# Patient Record
Sex: Male | Born: 1945 | Race: White | Hispanic: No | Marital: Married | Smoking: Former smoker
Health system: Southern US, Community
[De-identification: ages and names within clinical notes are randomized; demographics above are authoritative.]

## PROBLEM LIST (undated history)

## (undated) DIAGNOSIS — T8859XA Other complications of anesthesia, initial encounter: Secondary | ICD-10-CM

## (undated) DIAGNOSIS — D126 Benign neoplasm of colon, unspecified: Secondary | ICD-10-CM

## (undated) DIAGNOSIS — E039 Hypothyroidism, unspecified: Secondary | ICD-10-CM

## (undated) DIAGNOSIS — K5790 Diverticulosis of intestine, part unspecified, without perforation or abscess without bleeding: Secondary | ICD-10-CM

## (undated) DIAGNOSIS — R5383 Other fatigue: Secondary | ICD-10-CM

## (undated) DIAGNOSIS — I714 Abdominal aortic aneurysm, without rupture, unspecified: Secondary | ICD-10-CM

## (undated) DIAGNOSIS — C801 Malignant (primary) neoplasm, unspecified: Secondary | ICD-10-CM

## (undated) DIAGNOSIS — R251 Tremor, unspecified: Secondary | ICD-10-CM

## (undated) DIAGNOSIS — I10 Essential (primary) hypertension: Secondary | ICD-10-CM

## (undated) DIAGNOSIS — I499 Cardiac arrhythmia, unspecified: Secondary | ICD-10-CM

## (undated) DIAGNOSIS — K219 Gastro-esophageal reflux disease without esophagitis: Secondary | ICD-10-CM

## (undated) DIAGNOSIS — Q2730 Arteriovenous malformation, site unspecified: Secondary | ICD-10-CM

## (undated) DIAGNOSIS — Z923 Personal history of irradiation: Secondary | ICD-10-CM

## (undated) DIAGNOSIS — I6529 Occlusion and stenosis of unspecified carotid artery: Secondary | ICD-10-CM

## (undated) DIAGNOSIS — I472 Ventricular tachycardia: Secondary | ICD-10-CM

## (undated) DIAGNOSIS — J449 Chronic obstructive pulmonary disease, unspecified: Secondary | ICD-10-CM

## (undated) DIAGNOSIS — I255 Ischemic cardiomyopathy: Secondary | ICD-10-CM

## (undated) DIAGNOSIS — D649 Anemia, unspecified: Secondary | ICD-10-CM

## (undated) DIAGNOSIS — N400 Enlarged prostate without lower urinary tract symptoms: Secondary | ICD-10-CM

## (undated) DIAGNOSIS — E785 Hyperlipidemia, unspecified: Secondary | ICD-10-CM

## (undated) DIAGNOSIS — T4145XA Adverse effect of unspecified anesthetic, initial encounter: Secondary | ICD-10-CM

## (undated) DIAGNOSIS — I219 Acute myocardial infarction, unspecified: Secondary | ICD-10-CM

## (undated) DIAGNOSIS — I2510 Atherosclerotic heart disease of native coronary artery without angina pectoris: Secondary | ICD-10-CM

## (undated) DIAGNOSIS — R0600 Dyspnea, unspecified: Secondary | ICD-10-CM

## (undated) DIAGNOSIS — G4733 Obstructive sleep apnea (adult) (pediatric): Secondary | ICD-10-CM

## (undated) DIAGNOSIS — I739 Peripheral vascular disease, unspecified: Secondary | ICD-10-CM

## (undated) DIAGNOSIS — Z95810 Presence of automatic (implantable) cardiac defibrillator: Secondary | ICD-10-CM

## (undated) DIAGNOSIS — I509 Heart failure, unspecified: Secondary | ICD-10-CM

## (undated) DIAGNOSIS — Z9989 Dependence on other enabling machines and devices: Secondary | ICD-10-CM

## (undated) DIAGNOSIS — Z8719 Personal history of other diseases of the digestive system: Secondary | ICD-10-CM

## (undated) HISTORY — DX: Atherosclerotic heart disease of native coronary artery without angina pectoris: I25.10

## (undated) HISTORY — DX: Heart failure, unspecified: I50.9

## (undated) HISTORY — DX: Benign neoplasm of colon, unspecified: D12.6

## (undated) HISTORY — DX: Ventricular tachycardia: I47.2

## (undated) HISTORY — DX: Hyperlipidemia, unspecified: E78.5

## (undated) HISTORY — PX: ANGIOPLASTY: SHX39

## (undated) HISTORY — DX: Anemia, unspecified: D64.9

## (undated) HISTORY — DX: Benign prostatic hyperplasia without lower urinary tract symptoms: N40.0

## (undated) HISTORY — DX: Tremor, unspecified: R25.1

## (undated) HISTORY — DX: Diverticulosis of intestine, part unspecified, without perforation or abscess without bleeding: K57.90

## (undated) HISTORY — DX: Acute myocardial infarction, unspecified: I21.9

## (undated) HISTORY — DX: Abdominal aortic aneurysm, without rupture: I71.4

## (undated) HISTORY — DX: Gastro-esophageal reflux disease without esophagitis: K21.9

## (undated) HISTORY — DX: Essential (primary) hypertension: I10

## (undated) HISTORY — DX: Obstructive sleep apnea (adult) (pediatric): G47.33

## (undated) HISTORY — DX: Other fatigue: R53.83

## (undated) HISTORY — DX: Ischemic cardiomyopathy: I25.5

## (undated) HISTORY — DX: Personal history of irradiation: Z92.3

## (undated) HISTORY — DX: Dependence on other enabling machines and devices: Z99.89

## (undated) HISTORY — DX: Arteriovenous malformation, site unspecified: Q27.30

## (undated) HISTORY — DX: Hypothyroidism, unspecified: E03.9

## (undated) HISTORY — PX: POLYPECTOMY: SHX149

## (undated) HISTORY — DX: Chronic obstructive pulmonary disease, unspecified: J44.9

## (undated) HISTORY — DX: Abdominal aortic aneurysm, without rupture, unspecified: I71.40

---

## 1985-11-04 HISTORY — PX: CORONARY ARTERY BYPASS GRAFT: SHX141

## 1997-03-11 HISTORY — PX: ILIAC ARTERY STENT: SHX1786

## 1998-04-17 ENCOUNTER — Inpatient Hospital Stay (HOSPITAL_COMMUNITY): Admission: EM | Admit: 1998-04-17 | Discharge: 1998-04-23 | Payer: Self-pay | Admitting: Emergency Medicine

## 1998-06-10 ENCOUNTER — Inpatient Hospital Stay (HOSPITAL_COMMUNITY): Admission: AD | Admit: 1998-06-10 | Discharge: 1998-06-14 | Payer: Self-pay | Admitting: Obstetrics & Gynecology

## 1998-06-11 HISTORY — PX: CARDIAC CATHETERIZATION: SHX172

## 2002-10-01 ENCOUNTER — Encounter: Payer: Self-pay | Admitting: Cardiovascular Disease

## 2002-10-01 ENCOUNTER — Inpatient Hospital Stay (HOSPITAL_COMMUNITY): Admission: EM | Admit: 2002-10-01 | Discharge: 2002-10-05 | Payer: Self-pay | Admitting: Emergency Medicine

## 2002-10-03 HISTORY — PX: CARDIAC CATHETERIZATION: SHX172

## 2002-10-21 ENCOUNTER — Encounter: Payer: Self-pay | Admitting: Gastroenterology

## 2003-12-15 ENCOUNTER — Observation Stay (HOSPITAL_COMMUNITY): Admission: EM | Admit: 2003-12-15 | Discharge: 2003-12-16 | Payer: Self-pay | Admitting: Emergency Medicine

## 2003-12-24 ENCOUNTER — Ambulatory Visit (HOSPITAL_COMMUNITY): Admission: RE | Admit: 2003-12-24 | Discharge: 2003-12-24 | Payer: Self-pay | Admitting: Gastroenterology

## 2003-12-24 ENCOUNTER — Encounter: Payer: Self-pay | Admitting: Internal Medicine

## 2004-01-08 ENCOUNTER — Ambulatory Visit (HOSPITAL_COMMUNITY): Admission: RE | Admit: 2004-01-08 | Discharge: 2004-01-09 | Payer: Self-pay | Admitting: Cardiovascular Disease

## 2004-01-08 HISTORY — PX: CORONARY ANGIOPLASTY: SHX604

## 2004-01-09 ENCOUNTER — Encounter: Payer: Self-pay | Admitting: Gastroenterology

## 2004-01-12 DIAGNOSIS — D126 Benign neoplasm of colon, unspecified: Secondary | ICD-10-CM

## 2004-01-12 HISTORY — DX: Benign neoplasm of colon, unspecified: D12.6

## 2004-01-20 ENCOUNTER — Encounter: Payer: Self-pay | Admitting: Gastroenterology

## 2004-03-21 ENCOUNTER — Encounter: Admission: RE | Admit: 2004-03-21 | Discharge: 2004-03-21 | Payer: Self-pay | Admitting: Cardiology

## 2004-03-24 ENCOUNTER — Ambulatory Visit (HOSPITAL_COMMUNITY): Admission: RE | Admit: 2004-03-24 | Discharge: 2004-03-25 | Payer: Self-pay | Admitting: Cardiovascular Disease

## 2004-03-24 HISTORY — PX: RENAL ARTERY STENT: SHX2321

## 2005-03-16 ENCOUNTER — Encounter: Admission: RE | Admit: 2005-03-16 | Discharge: 2005-03-16 | Payer: Self-pay | Admitting: Cardiovascular Disease

## 2005-03-21 ENCOUNTER — Observation Stay (HOSPITAL_COMMUNITY): Admission: RE | Admit: 2005-03-21 | Discharge: 2005-03-22 | Payer: Self-pay | Admitting: *Deleted

## 2005-06-10 HISTORY — PX: CARDIAC DEFIBRILLATOR PLACEMENT: SHX171

## 2007-06-18 ENCOUNTER — Emergency Department (HOSPITAL_COMMUNITY): Admission: EM | Admit: 2007-06-18 | Discharge: 2007-06-18 | Payer: Self-pay | Admitting: Emergency Medicine

## 2007-09-15 ENCOUNTER — Ambulatory Visit: Payer: Self-pay | Admitting: Internal Medicine

## 2007-09-15 ENCOUNTER — Inpatient Hospital Stay (HOSPITAL_COMMUNITY): Admission: EM | Admit: 2007-09-15 | Discharge: 2007-09-20 | Payer: Self-pay | Admitting: Emergency Medicine

## 2007-09-16 HISTORY — PX: CARDIAC CATHETERIZATION: SHX172

## 2007-10-07 ENCOUNTER — Encounter: Admission: RE | Admit: 2007-10-07 | Discharge: 2007-10-07 | Payer: Self-pay | Admitting: Cardiovascular Disease

## 2008-11-03 ENCOUNTER — Telehealth: Payer: Self-pay | Admitting: Gastroenterology

## 2008-12-13 ENCOUNTER — Inpatient Hospital Stay (HOSPITAL_COMMUNITY): Admission: EM | Admit: 2008-12-13 | Discharge: 2008-12-18 | Payer: Self-pay | Admitting: Emergency Medicine

## 2008-12-16 ENCOUNTER — Ambulatory Visit: Payer: Self-pay | Admitting: Vascular Surgery

## 2009-03-23 ENCOUNTER — Ambulatory Visit: Payer: Self-pay | Admitting: Gastroenterology

## 2009-03-23 DIAGNOSIS — Z860100 Personal history of colon polyps, unspecified: Secondary | ICD-10-CM | POA: Insufficient documentation

## 2009-03-23 DIAGNOSIS — R1319 Other dysphagia: Secondary | ICD-10-CM | POA: Insufficient documentation

## 2009-03-23 DIAGNOSIS — K219 Gastro-esophageal reflux disease without esophagitis: Secondary | ICD-10-CM | POA: Insufficient documentation

## 2009-03-23 DIAGNOSIS — I2510 Atherosclerotic heart disease of native coronary artery without angina pectoris: Secondary | ICD-10-CM | POA: Insufficient documentation

## 2009-03-24 ENCOUNTER — Encounter: Payer: Self-pay | Admitting: Gastroenterology

## 2009-04-27 ENCOUNTER — Encounter: Payer: Self-pay | Admitting: Gastroenterology

## 2009-04-27 ENCOUNTER — Ambulatory Visit: Payer: Self-pay | Admitting: Gastroenterology

## 2009-04-27 LAB — HM COLONOSCOPY

## 2009-04-28 ENCOUNTER — Encounter: Payer: Self-pay | Admitting: Gastroenterology

## 2009-04-29 ENCOUNTER — Encounter (INDEPENDENT_AMBULATORY_CARE_PROVIDER_SITE_OTHER): Payer: Self-pay

## 2009-07-08 ENCOUNTER — Ambulatory Visit: Payer: Self-pay | Admitting: Gastroenterology

## 2009-11-08 ENCOUNTER — Ambulatory Visit: Payer: Self-pay | Admitting: Internal Medicine

## 2009-11-08 DIAGNOSIS — M549 Dorsalgia, unspecified: Secondary | ICD-10-CM | POA: Insufficient documentation

## 2009-11-22 ENCOUNTER — Ambulatory Visit: Payer: Self-pay | Admitting: Internal Medicine

## 2009-11-22 DIAGNOSIS — I10 Essential (primary) hypertension: Secondary | ICD-10-CM | POA: Insufficient documentation

## 2009-11-22 DIAGNOSIS — N400 Enlarged prostate without lower urinary tract symptoms: Secondary | ICD-10-CM | POA: Insufficient documentation

## 2009-11-22 DIAGNOSIS — R7309 Other abnormal glucose: Secondary | ICD-10-CM | POA: Insufficient documentation

## 2009-11-22 DIAGNOSIS — N401 Enlarged prostate with lower urinary tract symptoms: Secondary | ICD-10-CM | POA: Insufficient documentation

## 2009-11-22 LAB — CONVERTED CEMR LAB
BUN: 25 mg/dL — ABNORMAL HIGH (ref 6–23)
CO2: 28 meq/L (ref 19–32)
Calcium: 9 mg/dL (ref 8.4–10.5)
Chloride: 106 meq/L (ref 96–112)
Creatinine, Ser: 0.9 mg/dL (ref 0.4–1.5)
GFR calc non Af Amer: 90.04 mL/min (ref >60)
Glucose, Bld: 92 mg/dL (ref 70–99)
Hgb A1c MFr Bld: 5.6 % (ref 4.6–6.5)
PSA: 3.34 ng/mL (ref 0.10–4.00)
Potassium: 4.4 meq/L (ref 3.5–5.1)
Sodium: 141 meq/L (ref 135–145)

## 2010-02-14 ENCOUNTER — Telehealth: Payer: Self-pay | Admitting: Gastroenterology

## 2010-02-25 ENCOUNTER — Encounter: Payer: Self-pay | Admitting: Internal Medicine

## 2010-04-11 ENCOUNTER — Emergency Department (HOSPITAL_COMMUNITY): Admission: EM | Admit: 2010-04-11 | Discharge: 2010-04-11 | Payer: Self-pay | Admitting: Emergency Medicine

## 2010-05-02 ENCOUNTER — Ambulatory Visit (HOSPITAL_COMMUNITY): Admission: RE | Admit: 2010-05-02 | Discharge: 2010-05-02 | Payer: Self-pay | Admitting: Cardiovascular Disease

## 2010-05-02 HISTORY — PX: ICD GENERATOR CHANGE: SHX5854

## 2010-07-11 ENCOUNTER — Ambulatory Visit: Payer: Self-pay | Admitting: Internal Medicine

## 2010-07-11 DIAGNOSIS — R972 Elevated prostate specific antigen [PSA]: Secondary | ICD-10-CM | POA: Insufficient documentation

## 2010-07-11 LAB — CONVERTED CEMR LAB
ALT: 136 units/L — ABNORMAL HIGH (ref 0–53)
AST: 78 units/L — ABNORMAL HIGH (ref 0–37)
Albumin: 3.8 g/dL (ref 3.5–5.2)
Alkaline Phosphatase: 77 units/L (ref 39–117)
BUN: 16 mg/dL (ref 6–23)
Basophils Absolute: 0 10*3/uL (ref 0.0–0.1)
Basophils Relative: 0.2 % (ref 0.0–3.0)
Bilirubin Urine: NEGATIVE
Bilirubin, Direct: 0.3 mg/dL (ref 0.0–0.3)
CO2: 29 meq/L (ref 19–32)
Calcium: 9 mg/dL (ref 8.4–10.5)
Chloride: 104 meq/L (ref 96–112)
Cholesterol: 99 mg/dL (ref 0–200)
Creatinine, Ser: 0.7 mg/dL (ref 0.4–1.5)
Eosinophils Absolute: 0.4 10*3/uL (ref 0.0–0.7)
Eosinophils Relative: 6.9 % — ABNORMAL HIGH (ref 0.0–5.0)
GFR calc non Af Amer: 118.15 mL/min (ref >60)
Glucose, Bld: 99 mg/dL (ref 70–99)
HCT: 39.9 % (ref 39.0–52.0)
HDL: 36 mg/dL — ABNORMAL LOW (ref >39.00)
Hemoglobin, Urine: NEGATIVE
Hemoglobin: 13.5 g/dL (ref 13.0–17.0)
Hgb A1c MFr Bld: 5.5 % (ref 4.6–6.5)
Ketones, ur: NEGATIVE mg/dL
LDL Cholesterol: 51 mg/dL (ref 0–99)
Leukocytes, UA: NEGATIVE
Lymphocytes Relative: 14.5 % (ref 12.0–46.0)
Lymphs Abs: 0.7 10*3/uL (ref 0.7–4.0)
MCHC: 33.8 g/dL (ref 30.0–36.0)
MCV: 91.9 fL (ref 78.0–100.0)
Monocytes Absolute: 0.6 10*3/uL (ref 0.1–1.0)
Monocytes Relative: 12.1 % — ABNORMAL HIGH (ref 3.0–12.0)
Neutro Abs: 3.4 10*3/uL (ref 1.4–7.7)
Neutrophils Relative %: 66.3 % (ref 43.0–77.0)
Nitrite: NEGATIVE
PSA: 2.68 ng/mL (ref 0.10–4.00)
Platelets: 139 10*3/uL — ABNORMAL LOW (ref 150.0–400.0)
Potassium: 4.9 meq/L (ref 3.5–5.1)
RBC: 4.34 M/uL (ref 4.22–5.81)
RDW: 14 % (ref 11.5–14.6)
Sodium: 139 meq/L (ref 135–145)
Specific Gravity, Urine: <=1.005 (ref 1.000–1.030)
TSH: 0.04 microintl units/mL — ABNORMAL LOW (ref 0.35–5.50)
Total Bilirubin: 1.1 mg/dL (ref 0.3–1.2)
Total CHOL/HDL Ratio: 3
Total Protein, Urine: NEGATIVE mg/dL
Total Protein: 6.1 g/dL (ref 6.0–8.3)
Triglycerides: 58 mg/dL (ref 0.0–149.0)
Urine Glucose: NEGATIVE mg/dL
Urobilinogen, UA: 0.2 (ref 0.0–1.0)
VLDL: 11.6 mg/dL (ref 0.0–40.0)
WBC: 5.1 10*3/uL (ref 4.5–10.5)
pH: 5 (ref 5.0–8.0)

## 2010-07-12 ENCOUNTER — Encounter: Payer: Self-pay | Admitting: Internal Medicine

## 2010-07-14 ENCOUNTER — Telehealth: Payer: Self-pay | Admitting: Internal Medicine

## 2010-07-28 ENCOUNTER — Encounter: Payer: Self-pay | Admitting: Internal Medicine

## 2010-08-04 ENCOUNTER — Ambulatory Visit: Payer: Self-pay | Admitting: Internal Medicine

## 2010-08-10 ENCOUNTER — Encounter: Payer: Self-pay | Admitting: Internal Medicine

## 2010-08-11 ENCOUNTER — Encounter: Payer: Self-pay | Admitting: Internal Medicine

## 2010-08-24 ENCOUNTER — Ambulatory Visit: Payer: Self-pay | Admitting: Internal Medicine

## 2010-10-14 ENCOUNTER — Ambulatory Visit: Payer: Self-pay | Admitting: Internal Medicine

## 2010-10-14 DIAGNOSIS — J449 Chronic obstructive pulmonary disease, unspecified: Secondary | ICD-10-CM | POA: Insufficient documentation

## 2010-11-25 ENCOUNTER — Encounter: Payer: Self-pay | Admitting: Internal Medicine

## 2010-12-29 ENCOUNTER — Ambulatory Visit
Admission: RE | Admit: 2010-12-29 | Discharge: 2010-12-29 | Payer: Self-pay | Source: Home / Self Care | Attending: Internal Medicine | Admitting: Internal Medicine

## 2010-12-29 DIAGNOSIS — J019 Acute sinusitis, unspecified: Secondary | ICD-10-CM | POA: Insufficient documentation

## 2011-01-09 ENCOUNTER — Telehealth: Payer: Self-pay | Admitting: Internal Medicine

## 2011-01-12 NOTE — Assessment & Plan Note (Signed)
Summary: YEARLY F/U MEDICARE/WILL COME FASTING FOR LABS AFTER CPX/CD   Vital Signs:  Patient profile:   65 year old male Height:      68 inches Weight:      184 pounds O2 Sat:      95 % on Room air Temp:     98.0 degrees F oral Pulse rate:   58 / minute Pulse rhythm:   regular Resp:     16 per minute BP sitting:   100 / 54  (left arm) Cuff size:   large  Vitals Entered By: Estell Harpin CMA (July 11, 2010 9:03 AM)  O2 Flow:  Room air CC: CPX w/ labs, Preventive Care Is Patient Diabetic? No Pain Assessment Patient in pain? no        Primary Care Provider:  [name] MD  CC:  CPX w/ labs and Preventive Care.  History of Present Illness:  Follow-Up Visit      This is a 65 year old man who presents for Follow-up visit.  The patient complains of edema, but denies chest pain, palpitations, dizziness, syncope, low blood sugar symptoms, high blood sugar symptoms, SOB, DOE, PND, and orthopnea.  Since the last visit the patient notes no new problems or concerns.  The patient reports taking meds as prescribed, monitoring BP, monitoring blood sugars, and dietary noncompliance.  When questioned about possible medication side effects, the patient notes none.    Preventive Screening-Counseling & Management  Alcohol-Tobacco     Alcohol drinks/day: 1     Alcohol type: wine     >5/day in last 3 mos: no     Alcohol Counseling: not indicated; use of alcohol is not excessive or problematic     Feels need to cut down: no     Feels annoyed by complaints: no     Feels guilty re: drinking: no     Needs 'eye opener' in am: no     Smoking Status: quit     Year Quit: 2004     Tobacco Counseling: to remain off tobacco products  Hep-HIV-STD-Contraception     Hepatitis Risk: no risk noted     HIV Risk: no risk noted     STD Risk: no risk noted     TSE monthly: yes     Testicular SE Education/Counseling to perform regular STE  Safety-Violence-Falls     Seat Belt Use: yes  Helmet Use: yes     Firearms in the Home: no firearms in the home     Smoke Detectors: yes     Violence in the Home: no risk noted     Sexual Abuse: no      Sexual History:  currently monogamous.        Drug Use:  never.        Blood Transfusions:  no.    Clinical Review Panels:  Prevention   Last Colonoscopy:  Location:  [place].  (04/27/2009)   Last PSA:  3.34 (11/22/2009)  Diabetes Management   HgBA1C:  5.6 (11/22/2009)   Creatinine:  0.9 (11/22/2009)   Last Flu Vaccine:  given (10/11/2009)   Last Pneumovax:  given (12/11/2006)  Complete Metabolic Panel   Glucose:  92 (11/22/2009)   Sodium:  141 (11/22/2009)   Potassium:  4.4 (11/22/2009)   Chloride:  106 (11/22/2009)   CO2:  28 (11/22/2009)   BUN:  25 (11/22/2009)   Creatinine:  0.9 (11/22/2009)   Calcium:  9.0 (11/22/2009)  Medications Prior to Update: 1)  Coreg 25 Mg Tabs (Carvedilol) .... One Tablet By Mouth Two Times A Day 2)  Plavix 75 Mg Tabs (Clopidogrel Bisulfate) .... One Tablet By Mouth Once Daily 3)  Lasix 40 Mg Tabs (Furosemide) .... One Tablet By Mouth Once Daily 4)  Imdur 60 Mg Xr24h-Tab (Isosorbide Mononitrate) .... One Tablet By Mouth Every Morning and 1/2 Tablet By Mouth At Bedtime 5)  Nitroglycerin 0.4 Mg Subl (Nitroglycerin) .... As Needed 6)  Spironolactone 25 Mg Tabs (Spironolactone) .... One Tablet By Mouth Once Daily 7)  Ramipril 2.5 Mg Caps (Ramipril) .... 2 Tablets By Mouth Every Morning and 1 Tablet By Mouth At Bedtime 8)  Spiriva Handihaler 18 Mcg Caps (Tiotropium Bromide Monohydrate) .... Once Daily 9)  Ambien 10 Mg Tabs (Zolpidem Tartrate) .... One Tablet By Mouth At Bedtime As Needed 10)  Fish Oil  Oil (Fish Oil) .Marland Kitchen.. 1200 Mg 1 Capsule Every Other Day 11)  Omeprazole 40 Mg Cpdr (Omeprazole) .... One Tablet By Mouth At Bedtime 12)  Lipitor 40 Mg Tabs (Atorvastatin Calcium) .... Take 1 Tablet By Mouth Once A Day 13)  Cordarone 200 Mg Tabs (Amiodarone Hcl) .Marland Kitchen.. 1 Tab Am  and 1/2 Pm 14)  Onetouch Ultra Test  Strp (Glucose Blood) .... Test Qd 15)  Onetouch Ultrasoft Lancets  Misc (Lancets) .... Test Once Daily 16)  Ocuvite W/lutein 75mg  .... Once Daily 17)  Cranberry Fruit Extract 300mg  .... Take 1 Tablet By Mouth Once A Day 18)  Amrix 15 Mg Xr24h-Cap (Cyclobenzaprine Hcl) .... One By Mouth Once Daily As Needed For Low Back Spasms  Current Medications (verified): 1)  Coreg 25 Mg Tabs (Carvedilol) .... One Tablet By Mouth Two Times A Day 2)  Plavix 75 Mg Tabs (Clopidogrel Bisulfate) .... One Tablet By Mouth Once Daily 3)  Lasix 40 Mg Tabs (Furosemide) .... One Tablet By Mouth Once Daily 4)  Imdur 60 Mg Xr24h-Tab (Isosorbide Mononitrate) .... One Tablet By Mouth Every Morning and 1/2 Tablet By Mouth At Bedtime 5)  Nitroglycerin 0.4 Mg Subl (Nitroglycerin) .... As Needed 6)  Spironolactone 25 Mg Tabs (Spironolactone) .... One Tablet By Mouth Once Daily 7)  Ramipril 2.5 Mg Caps (Ramipril) .... 2 Tablets By Mouth Every Morning and 1 Tablet By Mouth At Bedtime 8)  Ambien 10 Mg Tabs (Zolpidem Tartrate) .... One Tablet By Mouth At Bedtime As Needed 9)  Fish Oil  Oil (Fish Oil) .Marland Kitchen.. 1200 Mg 1 Capsule Every Other Day 10)  Omeprazole 40 Mg Cpdr (Omeprazole) .... One Tablet By Mouth At Bedtime 11)  Lipitor 40 Mg Tabs (Atorvastatin Calcium) .... Take 1 Tablet By Mouth Once A Day 12)  Cordarone 200 Mg Tabs (Amiodarone Hcl) .Marland Kitchen.. 1 Tab Am and 1/2 Pm 13)  Onetouch Ultra Test  Strp (Glucose Blood) .... Test Qd 14)  Onetouch Ultrasoft Lancets  Misc (Lancets) .... Test Once Daily 15)  Ocuvite W/lutein 75mg  .... Once Daily 16)  Cranberry Fruit Extract 300mg  .... Take 1 Tablet By Mouth Once A Day 17)  Rapaflo 8 Mg Caps (Silodosin) .... One By Mouth Once Daily To Help Urine Flow  Allergies (verified): 1)  ! 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  Alcohol Use - no Illicit Drug Use - no  Risk Factors: Alcohol Use: 1 (07/11/2010) >5 drinks/d w/in last 3 months: no (07/11/2010)  Risk Factors: Smoking Status: quit (07/11/2010)  Family History: Reviewed history from 03/23/2009 and no changes required. Family History of Colon Cancer:Mother, sister Lung cancer: Maternal Grandmother, Maternal Grandfather Family History of Diabetes: siblings Family History of Heart Disease: siblings  Social History: Reviewed history from 03/23/2009 and no changes required. Married Occupation: retired Patient is a former smoker.  Alcohol Use - no Illicit Drug Use - no Hepatitis Risk:  no risk noted HIV Risk:  no risk noted STD Risk:  no risk noted Seat Belt Use:  yes  Review of Systems       The patient complains of peripheral edema.  The patient denies anorexia, fever, weight loss, weight gain, chest pain, syncope, prolonged cough, headaches, hemoptysis, abdominal pain, melena, hematochezia, severe indigestion/heartburn, hematuria, suspicious skin lesions, difficulty walking, depression, abnormal bleeding, enlarged lymph nodes, angioedema, and testicular masses.   Resp:  Denies chest pain with inspiration, cough, coughing up blood, pleuritic,  shortness of breath, sputum productive, and wheezing. GU:  Complains of nocturia, urinary frequency, and urinary hesitancy; denies decreased libido, discharge, dysuria, hematuria, and incontinence. Endo:  Complains of polyuria; denies cold intolerance, excessive hunger, excessive thirst, excessive urination, heat intolerance, and weight change.  Physical Exam  General:  obese.  alert, well-developed, well-nourished, well-hydrated, appropriate dress, normal appearance, healthy-appearing, cooperative to examination, and good hygiene.   Head:  normocephalic and atraumatic.   Eyes:  No corneal or conjunctival inflammation noted. EOMI. Perrla. Funduscopic exam benign, without hemorrhages, exudates or papilledema. Vision grossly normal. Ears:  R ear normal and L ear normal.   Mouth:  Oral mucosa and oropharynx without lesions or exudates.  Teeth in good repair. Neck:  supple, full ROM, no masses, no thyromegaly, no thyroid nodules or tenderness, no JVD, no carotid bruits, no cervical lymphadenopathy, and no neck tenderness.   Lungs:  normal respiratory effort, no intercostal retractions, no accessory muscle use, normal breath sounds, no dullness, no fremitus, no crackles, and no wheezes.   Heart:  normal rate, regular rhythm, no murmur, no gallop, no rub, and no JVD.   Abdomen:  soft, non-tender, normal bowel sounds, no distention, no masses, no guarding, no rigidity, no rebound tenderness, no abdominal hernia, no inguinal hernia, no hepatomegaly, and no splenomegaly.   Rectal:  No external abnormalities noted. Normal sphincter tone. No rectal masses or tenderness. Genitalia:  uncircumcised, no hydrocele, no varicocele, no scrotal masses, no cutaneous lesions, no urethral discharge, R testes atrophic, and L testes atrophic.   Prostate:  no nodules, no asymmetry, no induration, and 2+ enlarged.   Msk:  normal ROM, no joint tenderness, no joint swelling, no joint warmth, no redness over joints, no joint  deformities, no joint instability, and no crepitation.   Pulses:  R and L carotid,radial,femoral,dorsalis pedis and posterior tibial pulses are full and equal bilaterally Extremities:  2+ left pedal edema and 2+ right pedal edema.   Neurologic:  No cranial nerve deficits noted. Station and gait are normal. Plantar reflexes are down-going bilaterally. DTRs are symmetrical throughout. Sensory, motor  and coordinative functions appear intact. Skin:  turgor normal, color normal, no rashes, no suspicious lesions, no ecchymoses, no petechiae, no purpura, no ulcerations, and no edema.   Cervical Nodes:  no anterior cervical adenopathy and no posterior cervical adenopathy.   Axillary Nodes:  no R axillary adenopathy and no L axillary adenopathy.   Inguinal Nodes:  no R inguinal adenopathy and no L inguinal adenopathy.   Psych:  Oriented X3, memory intact for recent and remote, normally interactive, good eye contact, not anxious appearing, not depressed appearing, and not agitated.     Impression & Recommendations:  Problem # 1:  PSA, INCREASED (ICD-790.93) Assessment Unchanged  Orders: Venipuncture [id number]) TLB-Lipid Panel (80061-LIPID) TLB-BMP (Basic Metabolic Panel-BMET) (99991111) TLB-CBC Platelet - w/Differential (85025-CBCD) TLB-Hepatic/Liver Function Pnl (80076-HEPATIC) TLB-TSH (Thyroid Stimulating Hormone) (84443-TSH) TLB-A1C / Hgb A1C (Glycohemoglobin) (83036-A1C) TLB-PSA (Prostate Specific Antigen) (84153-PSA) TLB-Udip w/ Micro (81001-URINE)  Problem # 2:  HYPERTROPHY PROSTATE W/UR OBST & OTH LUTS (ICD-600.01) Assessment: Deteriorated  His updated medication list for this problem includes:    Rapaflo 8 Mg Caps (Silodosin) ..... One by mouth once daily to help urine flow  Orders: Venipuncture [id number]) TLB-Lipid Panel (80061-LIPID) TLB-BMP (Basic Metabolic Panel-BMET) (99991111) TLB-CBC Platelet - w/Differential (85025-CBCD) TLB-Hepatic/Liver Function Pnl  (80076-HEPATIC) TLB-TSH (Thyroid Stimulating Hormone) (84443-TSH) TLB-A1C / Hgb A1C (Glycohemoglobin) (83036-A1C) TLB-PSA (Prostate Specific Antigen) (84153-PSA) TLB-Udip w/ Micro (81001-URINE)  Problem # 3:  HYPERGLYCEMIA, BORDERLINE (ICD-790.29) Assessment: Unchanged  Orders: Venipuncture [id number]) TLB-Lipid Panel (80061-LIPID) TLB-BMP (Basic Metabolic Panel-BMET) (99991111) TLB-CBC Platelet - w/Differential (85025-CBCD) TLB-Hepatic/Liver Function Pnl (80076-HEPATIC) TLB-TSH (Thyroid Stimulating Hormone) (84443-TSH) TLB-A1C / Hgb A1C (Glycohemoglobin) (83036-A1C) TLB-PSA (Prostate Specific Antigen) (84153-PSA) TLB-Udip w/ Micro (81001-URINE)  Complete Medication List: 1)  Coreg 25 Mg Tabs (Carvedilol) .... One tablet by mouth two times a day 2)  Plavix 75 Mg Tabs (Clopidogrel bisulfate) .... One tablet by mouth once daily 3)  Lasix 40 Mg Tabs (Furosemide) .... One tablet by mouth once daily 4)  Imdur 60 Mg Xr24h-tab (Isosorbide mononitrate) .... One tablet by mouth every morning and 1/2 tablet by mouth at bedtime 5)  Nitroglycerin 0.4 Mg Subl (Nitroglycerin) .... As needed 6)  Spironolactone 25 Mg Tabs (Spironolactone) .... One tablet by mouth once daily 7)  Ramipril 2.5 Mg Caps (Ramipril) .... 2 tablets by mouth every morning and 1 tablet by mouth at bedtime 8)  Ambien 10 Mg Tabs (Zolpidem tartrate) .... One tablet by mouth at bedtime as needed 9)  Fish Oil Oil (Fish oil) .Marland Kitchen.. 1200 mg 1 capsule every other day 10)  Omeprazole 40 Mg Cpdr (Omeprazole) .... One tablet by mouth at bedtime 11)  Lipitor 40 Mg Tabs (Atorvastatin calcium) .... Take 1 tablet by mouth once a day 12)  Cordarone 200 Mg Tabs (Amiodarone hcl) .Marland Kitchen.. 1 tab am and 1/2 pm 13)  Onetouch Ultra Test Strp (Glucose blood) .... Test qd 14)  Onetouch Ultrasoft Lancets Misc (Lancets) .... Test once daily 15)  Ocuvite W/lutein 75mg   .... Once daily 16)  Cranberry Fruit Extract 300mg   .... Take 1 tablet by mouth once a  day 17)  Rapaflo 8 Mg Caps (Silodosin) .... One by mouth once daily to help urine flow  Colorectal Screening:  Current Recommendations:    Hemoccult: NEG X 1 today  PSA Screening:    PSA: 3.34  (11/22/2009)    Reviewed PSA screening recommendations: PSA ordered  Immunization & Chemoprophylaxis:    Tetanus vaccine: Historical  (12/11/2005)    Influenza vaccine: given  (10/11/2009)  Pneumovax: given  (12/11/2006)  Patient Instructions: 1)  Please schedule a follow-up appointment in 3 months. Prescriptions: RAPAFLO 8 MG CAPS (SILODOSIN) One by mouth once daily to help urine flow  #84 x 0   Entered and Authorized by:   [name] MD   Signed by:   [name] MD on 07/11/2010   Method used:   Samples Given   RxID:   [id number]

## 2011-01-12 NOTE — Letter (Signed)
Summary: [facility]  [facility]   Imported By: Phillis Knack 08/08/2010 11:06:04  _____________________________________________________________________  External Attachment:    Type:   Image     Comment:   External Document

## 2011-01-12 NOTE — Assessment & Plan Note (Signed)
Summary: 3 WK ROV PER TRIAGE/NWS   Vital Signs:  Patient profile:   65 year old male Height:      68 inches Weight:      221.75 pounds BMI:     33.84 O2 Sat:      95 % on Room air Temp:     98.0 degrees F oral Pulse rate:   56 / minute Pulse rhythm:   regular Resp:     16 per minute BP sitting:   102 / 58  (left arm) Cuff size:   large  Vitals Entered By: Estell Harpin CMA (August 04, 2010 10:04 AM)  Nutrition Counseling: Patient's BMI is greater than 25 and therefore counseled on weight management options.  O2 Flow:  Room air CC: follow-up visit//lab results Is Patient Diabetic? No Pain Assessment Patient in pain? no        Primary Care Provider:  [name] MD  CC:  follow-up visit//lab results.  History of Present Illness:  Follow-Up Visit      This is a 65 year old man who presents for Follow-up visit.  The patient complains of edema, but denies chest pain, palpitations, dizziness, syncope, low blood sugar symptoms, high blood sugar symptoms, SOB, DOE, PND, and orthopnea.  Since the last visit the patient notes no new problems or concerns.  The patient reports taking meds as prescribed, monitoring BP, monitoring blood sugars, and dietary compliance.  When questioned about possible medication side effects, the patient notes none.    Preventive Screening-Counseling & Management  Alcohol-Tobacco     Alcohol drinks/day: 1     Alcohol type: wine     >5/day in last 3 mos: no     Alcohol Counseling: not indicated; use of alcohol is not excessive or problematic     Feels need to cut down: no     Feels annoyed by complaints: no     Feels guilty re: drinking: no     Needs 'eye opener' in am: no     Smoking Status: quit     Year Quit: 2004     Tobacco Counseling: to remain off tobacco products  Hep-HIV-STD-Contraception     Hepatitis Risk: no risk noted     HIV Risk: no risk noted     STD Risk: no risk noted     TSE monthly: yes     Testicular SE  Education/Counseling to perform regular STE      Sexual History:  currently monogamous.        Drug Use:  never.        Blood Transfusions:  no.    Clinical Review Panels:  Lipid Management   Cholesterol:  99 (07/11/2010)   LDL (bad choesterol):  51 (07/11/2010)   HDL (good cholesterol):  36.00 (07/11/2010)  Diabetes Management   HgBA1C:  5.5 (07/11/2010)   Creatinine:  0.7 (07/11/2010)   Last Flu Vaccine:  given (10/11/2009)   Last Pneumovax:  given (12/11/2006)  CBC   WBC:  5.1 (07/11/2010)   RBC:  4.34 (07/11/2010)   Hgb:  13.5 (07/11/2010)   Hct:  39.9 (07/11/2010)   Platelets:  139.0 (07/11/2010)   MCV  91.9 (07/11/2010)   MCHC  33.8 (07/11/2010)   RDW  14.0 (07/11/2010)   PMN:  66.3 (07/11/2010)   Lymphs:  14.5 (07/11/2010)   Monos:  12.1 (07/11/2010)   Eosinophils:  6.9 (07/11/2010)   Basophil:  0.2 (07/11/2010)  Complete Metabolic Panel   Glucose:  99 (  07/11/2010)   Sodium:  139 (07/11/2010)   Potassium:  4.9 (07/11/2010)   Chloride:  104 (07/11/2010)   CO2:  29 (07/11/2010)   BUN:  16 (07/11/2010)   Creatinine:  0.7 (07/11/2010)   Albumin:  3.8 (07/11/2010)   Total Protein:  6.1 (07/11/2010)   Calcium:  9.0 (07/11/2010)   Total Bili:  1.1 (07/11/2010)   Alk Phos:  77 (07/11/2010)   SGPT (ALT):  136 (07/11/2010)   SGOT (AST):  78 (07/11/2010)   Medications Prior to Update: 1)  Coreg 25 Mg Tabs (Carvedilol) .... One Tablet By Mouth Two Times A Day 2)  Plavix 75 Mg Tabs (Clopidogrel Bisulfate) .... One Tablet By Mouth Once Daily 3)  Lasix 40 Mg Tabs (Furosemide) .... One Tablet By Mouth Once Daily 4)  Imdur 60 Mg Xr24h-Tab (Isosorbide Mononitrate) .... One Tablet By Mouth Every Morning and 1/2 Tablet By Mouth At Bedtime 5)  Nitroglycerin 0.4 Mg Subl (Nitroglycerin) .... As Needed 6)  Spironolactone 25 Mg Tabs (Spironolactone) .... One Tablet By Mouth Once Daily 7)  Ramipril 2.5 Mg Caps (Ramipril) .... 2 Tablets By Mouth Every Morning and 1 Tablet By  Mouth At Bedtime 8)  Ambien 10 Mg Tabs (Zolpidem Tartrate) .... One Tablet By Mouth At Bedtime As Needed 9)  Fish Oil  Oil (Fish Oil) .Marland Kitchen.. 1200 Mg 1 Capsule Every Other Day 10)  Omeprazole 40 Mg Cpdr (Omeprazole) .... One Tablet By Mouth At Bedtime 11)  Lipitor 40 Mg Tabs (Atorvastatin Calcium) .... Take 1 Tablet By Mouth Once A Day 12)  Cordarone 200 Mg Tabs (Amiodarone Hcl) .Marland Kitchen.. 1 Tab Am and 1/2 Pm 13)  Onetouch Ultra Test  Strp (Glucose Blood) .... Test Qd 14)  Onetouch Ultrasoft Lancets  Misc (Lancets) .... Test Once Daily 15)  Ocuvite W/lutein 75mg  .... Once Daily 16)  Cranberry Fruit Extract 300mg  .... Take 1 Tablet By Mouth Once A Day 17)  Rapaflo 8 Mg Caps (Silodosin) .... One By Mouth Once Daily To Help Urine Flow  Current Medications (verified): 1)  Coreg 25 Mg Tabs (Carvedilol) .... One Tablet By Mouth Two Times A Day 2)  Plavix 75 Mg Tabs (Clopidogrel Bisulfate) .... One Tablet By Mouth Once Daily 3)  Lasix 40 Mg Tabs (Furosemide) .... One Tablet By Mouth Once Daily 4)  Imdur 60 Mg Xr24h-Tab (Isosorbide Mononitrate) .... One Tablet By Mouth Every Morning and 1/2 Tablet By Mouth At Bedtime 5)  Nitroglycerin 0.4 Mg Subl (Nitroglycerin) .... As Needed 6)  Spironolactone 25 Mg Tabs (Spironolactone) .... One Tablet By Mouth Once Daily 7)  Ramipril 2.5 Mg Caps (Ramipril) .... 2 Tablets By Mouth Every Morning and 1 Tablet By Mouth At Bedtime 8)  Ambien 10 Mg Tabs (Zolpidem Tartrate) .... One Tablet By Mouth At Bedtime As Needed 9)  Fish Oil  Oil (Fish Oil) .Marland Kitchen.. 1200 Mg 1 Capsule Every Other Day 10)  Omeprazole 40 Mg Cpdr (Omeprazole) .... One Tablet By Mouth At Bedtime 11)  Lipitor 40 Mg Tabs (Atorvastatin Calcium) .... Take 1 Tablet By Mouth Once A Day 12)  Cordarone 200 Mg Tabs (Amiodarone Hcl) .Marland Kitchen.. 1 Tab Am and 1/2 Pm 13)  Onetouch Ultra Test  Strp (Glucose Blood) .... Test Qd 14)  Onetouch Ultrasoft Lancets  Misc (Lancets) .... Test Once Daily 15)  Ocuvite W/lutein 75mg  .... Once  Daily 16)  Cranberry Fruit Extract 300mg  .... Take 1 Tablet By Mouth Once A Day 17)  Rapaflo 8 Mg Caps (Silodosin) .... One By Mouth  Once Daily To Help Urine Flow 18)  Red Wine  Allergies (verified): 1)  ! Penicillin  Past History:  Past Medical History: Last updated: 11/22/2009 AAA Cardiomyopathy Coronary Artery Disease, S/P stents Myocardial Infarctions Congestive Heart Failure Hyperlipidemia GERD Benign essential tremors  COPD Diabetes Diverticulosis Hemorrhoids Adenomatous Colon Polyps 01/2004 Anemia Sleep Apnea SB AVM on Capsule Endoscopy 12/2003 Hypertension  Past Surgical History: Last updated: 03/23/2009 Automatic implantable cardioverter-defribillator 2005 Bypass surgery CABG x2 Stent placements in chest, legs and kidneys  Family History: Last updated: 03/23/2009 Family History of Colon Cancer:Mother, sister Lung cancer: Maternal Grandmother, Maternal Grandfather Family History of Diabetes: siblings Family History of Heart Disease: siblings  Social History: Last updated: 03/23/2009 Married Occupation: retired Patient is a former smoker.  Alcohol Use - no Illicit Drug Use - no  Risk Factors: Alcohol Use: 1 (08/04/2010) >5 drinks/d w/in last 3 months: no (08/04/2010)  Risk Factors: Smoking Status: quit (08/04/2010)  Family History: Reviewed history from 03/23/2009 and no changes required. Family History of Colon Cancer:Mother, sister Lung cancer: Maternal Grandmother, Maternal Grandfather Family History of Diabetes: siblings Family History of Heart Disease: siblings  Social History: Reviewed history from 03/23/2009 and no changes required. Married Occupation: retired Patient is a former smoker.  Alcohol Use - no Illicit Drug Use - no  Review of Systems       The patient complains of peripheral edema.  The patient denies weight loss, weight gain, chest pain, syncope, dyspnea on exertion, prolonged cough, abdominal pain, hematuria,  difficulty walking, and depression.   GU:  Denies dysuria, hematuria, incontinence, nocturia, urinary frequency, and urinary hesitancy. Endo:  Denies cold intolerance, excessive hunger, excessive thirst, excessive urination, heat intolerance, polyuria, and weight change.  Physical Exam  General:  obese.  alert, well-developed, well-nourished, well-hydrated, appropriate dress, normal appearance, healthy-appearing, cooperative to examination, and good hygiene.   Head:  normocephalic, atraumatic, no abnormalities observed, and no abnormalities palpated.   Mouth:  Oral mucosa and oropharynx without lesions or exudates.  Teeth in good repair. Neck:  supple, full ROM, no masses, no thyromegaly, no thyroid nodules or tenderness, no JVD, no carotid bruits, no cervical lymphadenopathy, and no neck tenderness.   Lungs:  normal respiratory effort, no intercostal retractions, no accessory muscle use, normal breath sounds, no dullness, no fremitus, no crackles, and no wheezes.   Heart:  normal rate, regular rhythm, no murmur, no gallop, no rub, and no JVD.   Abdomen:  soft, non-tender, normal bowel sounds, no distention, no masses, no guarding, no rigidity, no rebound tenderness, no abdominal hernia, no inguinal hernia, no hepatomegaly, and no splenomegaly.   Msk:  normal ROM, no joint tenderness, no joint swelling, no joint warmth, no redness over joints, no joint deformities, no joint instability, and no crepitation.   Pulses:  R and L carotid,radial,femoral,dorsalis pedis and posterior tibial pulses are full and equal bilaterally Extremities:  1+ left pedal edema and 1+ right pedal edema.   Neurologic:  No cranial nerve deficits noted. Station and gait are normal. Plantar reflexes are down-going bilaterally. DTRs are symmetrical throughout. Sensory, motor and coordinative functions appear intact. Skin:  turgor normal, color normal, no rashes, no suspicious lesions, no ecchymoses, no petechiae, no purpura, no  ulcerations, and no edema.   Cervical Nodes:  no anterior cervical adenopathy and no posterior cervical adenopathy.   Psych:  Oriented X3, memory intact for recent and remote, normally interactive, good eye contact, not anxious appearing, not depressed appearing, and not agitated.  Impression & Recommendations:  Problem # 1:  PSA, INCREASED (ICD-790.93) Assessment Improved this is lower than it was a year ago so no need for further intervention at this time  Problem # 2:  HYPERTENSION (ICD-401.9) Assessment: Improved  His updated medication list for this problem includes:    Coreg 25 Mg Tabs (Carvedilol) ..... One tablet by mouth two times a day    Lasix 40 Mg Tabs (Furosemide) ..... One tablet by mouth once daily    Spironolactone 25 Mg Tabs (Spironolactone) ..... One tablet by mouth once daily    Ramipril 2.5 Mg Caps (Ramipril) .Marland Kitchen... 2 tablets by mouth every morning and 1 tablet by mouth at bedtime  BP today: 102/58 Prior BP: 100/54 (07/11/2010)  Labs Reviewed: K+: 4.9 (07/11/2010) Creat: : 0.7 (07/11/2010)   Chol: 99 (07/11/2010)   HDL: 36.00 (07/11/2010)   LDL: 51 (07/11/2010)   TG: 58.0 (07/11/2010)  Problem # 3:  HYPERTROPHY PROSTATE W/UR OBST & OTH LUTS (ICD-600.01) Assessment: Improved  His updated medication list for this problem includes:    Rapaflo 8 Mg Caps (Silodosin) ..... One by mouth once daily to help urine flow  Problem # 4:  HYPERGLYCEMIA, BORDERLINE (ICD-790.29) Assessment: Improved  Complete Medication List: 1)  Coreg 25 Mg Tabs (Carvedilol) .... One tablet by mouth two times a day 2)  Plavix 75 Mg Tabs (Clopidogrel bisulfate) .... One tablet by mouth once daily 3)  Lasix 40 Mg Tabs (Furosemide) .... One tablet by mouth once daily 4)  Imdur 60 Mg Xr24h-tab (Isosorbide mononitrate) .... One tablet by mouth every morning and 1/2 tablet by mouth at bedtime 5)  Nitroglycerin 0.4 Mg Subl (Nitroglycerin) .... As needed 6)  Spironolactone 25 Mg Tabs  (Spironolactone) .... One tablet by mouth once daily 7)  Ramipril 2.5 Mg Caps (Ramipril) .... 2 tablets by mouth every morning and 1 tablet by mouth at bedtime 8)  Ambien 10 Mg Tabs (Zolpidem tartrate) .... One tablet by mouth at bedtime as needed 9)  Fish Oil Oil (Fish oil) .Marland Kitchen.. 1200 mg 1 capsule every other day 10)  Omeprazole 40 Mg Cpdr (Omeprazole) .... One tablet by mouth at bedtime 11)  Lipitor 40 Mg Tabs (Atorvastatin calcium) .... Take 1 tablet by mouth once a day 12)  Cordarone 200 Mg Tabs (Amiodarone hcl) .Marland Kitchen.. 1 tab am and 1/2 pm 13)  Onetouch Ultra Test Strp (Glucose blood) .... Test qd 14)  Onetouch Ultrasoft Lancets Misc (Lancets) .... Test once daily 15)  Ocuvite W/lutein 75mg   .... Once daily 16)  Cranberry Fruit Extract 300mg   .... Take 1 tablet by mouth once a day 17)  Rapaflo 8 Mg Caps (Silodosin) .... One by mouth once daily to help urine flow 18)  Red Wine   Patient Instructions: 1)  Please schedule a follow-up appointment in 4 months. 2)  It is important that you exercise regularly at least 20 minutes 5 times a week. If you develop chest pain, have severe difficulty breathing, or feel very tired , stop exercising immediately and seek medical attention. 3)  You need to lose weight. Consider a lower calorie diet and regular exercise.  4)  Limit your Sodium (Salt) to less than 2 grams a day(slightly less than 1/2 a teaspoon) to prevent fluid retention, swelling, or worsening of symptoms. Prescriptions: RAPAFLO 8 MG CAPS (SILODOSIN) One by mouth once daily to help urine flow  #90 x 3   Entered and Authorized by:   [name] MD   Signed  by:   [name] MD on 08/04/2010   Method used:   Electronically to        [facility] (mail-order)       [place]  [address]       Ph: [phone]       Fax: [phone]   RxIDBC:[id number] RAPAFLO 8 MG CAPS (SILODOSIN) One by mouth once daily to help urine flow  #30 x  11   Entered and Authorized by:   [name] MD   Signed by:   [name] MD on 08/04/2010   Method used:   Electronically to        [facility]. [id number]* (retail)       [address]       [place], [place]  [address]       Ph: [phone] or [phone]       Fax: [phone]   RxID:   [id number]

## 2011-01-12 NOTE — Progress Notes (Signed)
Summary: Medication  Phone Note Call from Patient Call back at Home Phone [phone]   Caller: spouse  [name] Call For: Dr. Fuller Plan Reason for Call: Talk to Nurse Summary of Call: Pt wants to know if he can get a generic for Protonix Initial call taken by: Webb Laws,  February 14, 2010 10:42 AM  Follow-up for Phone Call        Rx was sent to pts pharmacy and pt notifed.  Follow-up by: Marlon Pel CMA Deborra Medina),  February 14, 2010 10:55 AM    Prescriptions: PANTOPRAZOLE SODIUM 40 MG TBEC (PANTOPRAZOLE SODIUM) one tablet by mouth at bedtime  #30 x 3   Entered by:   Marlon Pel CMA ([place])   Authorized by:   Ladene Artist MD [facility]   Signed by:   Marlon Pel CMA ([place]) on 02/14/2010   Method used:   Electronically to        [facility]. [id number]* (retail)       [address]       [place], [place]  [address]       Ph: [phone] or [phone]       Fax: [phone]   RxID:   [id number]

## 2011-01-12 NOTE — Progress Notes (Signed)
Summary: RESULTS  Phone Note Call from Patient Call back at Home Phone [phone]   Caller: Patient Call For: Dr Ronnald Ramp Summary of Call: Pt requesing lab results, concerned about A1C as he has diabetes and has not rec'd letter. Initial call taken by: Denice Paradise,  July 14, 2010 11:43 AM  Follow-up for Phone Call        lmoam for pt to call back Follow-up by: Ami Bullins CMA,  July 14, 2010 1:36 PM  Additional Follow-up for Phone Call Additional follow up Details #1::        Pt informed that letter was mailed. Gave results over the phone and transferred to scheduler for f/u office visit  Additional Follow-up by: Charlsie Quest, CMA,  July 14, 2010 1:44 PM

## 2011-01-12 NOTE — Assessment & Plan Note (Signed)
Summary: SINUS PROBLEM  STC   Vital Signs:  Patient profile:   65 year old male Height:      68 inches Weight:      220 pounds BMI:     33.57 O2 Sat:      97 % on Room air Temp:     98.6 degrees F oral Pulse rate:   57 / minute Pulse rhythm:   regular Resp:     16 per minute BP sitting:   122 / 68  (left arm) Cuff size:   large  Vitals Entered By: Shirlean Mylar Ewing CMA Deborra Medina) (December 29, 2010 2:46 PM)  Nutrition Counseling: Patient's BMI is greater than 25 and therefore counseled on weight management options.  O2 Flow:  Room air CC: Sinus Infection/RE, URI symptoms   Primary Care Provider:  [name] MD  CC:  Sinus Infection/RE and URI symptoms.  History of Present Illness:  URI Symptoms      This is a 65 year old man who presents with URI symptoms.  The symptoms began 2 weeks ago.  The severity is described as moderate.  The patient reports nasal congestion, purulent nasal discharge, sore throat, productive cough, and sick contacts, but denies clear nasal discharge, dry cough, and earache.  The patient denies fever, stiff neck, dyspnea, wheezing, rash, vomiting, diarrhea, use of an antipyretic, and response to antipyretic.  The patient denies itchy throat, sneezing, headache, muscle aches, and severe fatigue.  Risk factors for Strep sinusitis include unilateral facial pain, unilateral nasal discharge, poor response to decongestant, and double sickening.  The patient denies the following risk factors for Strep sinusitis: tooth pain, Strep exposure, tender adenopathy, and absence of cough.    Preventive Screening-Counseling & Management  Alcohol-Tobacco     Alcohol drinks/day: 1     Alcohol type: wine     >5/day in last 3 mos: no     Alcohol Counseling: not indicated; use of alcohol is not excessive or problematic     Feels need to cut down: no     Feels annoyed by complaints: no     Feels guilty re: drinking: no     Needs 'eye opener' in am: no     Smoking Status: quit     Year Quit: 2004     Tobacco Counseling: to remain off tobacco products  Hep-HIV-STD-Contraception     Hepatitis Risk: no risk noted     HIV Risk: no risk noted     STD Risk: no risk noted     TSE monthly: yes     Testicular SE Education/Counseling to perform regular STE      Sexual History:  currently monogamous.        Drug Use:  never.        Blood Transfusions:  no.    Clinical Review Panels:  Prevention   Last Colonoscopy:  Location:  [place].  (04/27/2009)   Last PSA:  2.68 (07/11/2010)  Immunizations   Last Tetanus Booster:  Historical (12/11/2005)   Last Flu Vaccine:  Fluvax 3+ (08/24/2010)   Last Pneumovax:  given (12/11/2006)  Lipid Management   Cholesterol:  99 (07/11/2010)   LDL (bad choesterol):  51 (07/11/2010)   HDL (good cholesterol):  36.00 (07/11/2010)  Diabetes Management   HgBA1C:  5.5 (07/11/2010)   Creatinine:  0.7 (07/11/2010)   Last Flu Vaccine:  Fluvax 3+ (08/24/2010)   Last Pneumovax:  given (12/11/2006)  CBC   WBC:  5.1 (07/11/2010)  RBC:  4.34 (07/11/2010)   Hgb:  13.5 (07/11/2010)   Hct:  39.9 (07/11/2010)   Platelets:  139.0 (07/11/2010)   MCV  91.9 (07/11/2010)   MCHC  33.8 (07/11/2010)   RDW  14.0 (07/11/2010)   PMN:  66.3 (07/11/2010)   Lymphs:  14.5 (07/11/2010)   Monos:  12.1 (07/11/2010)   Eosinophils:  6.9 (07/11/2010)   Basophil:  0.2 (07/11/2010)  Complete Metabolic Panel   Glucose:  99 (07/11/2010)   Sodium:  139 (07/11/2010)   Potassium:  4.9 (07/11/2010)   Chloride:  104 (07/11/2010)   CO2:  29 (07/11/2010)   BUN:  16 (07/11/2010)   Creatinine:  0.7 (07/11/2010)   Albumin:  3.8 (07/11/2010)   Total Protein:  6.1 (07/11/2010)   Calcium:  9.0 (07/11/2010)   Total Bili:  1.1 (07/11/2010)   Alk Phos:  77 (07/11/2010)   SGPT (ALT):  136 (07/11/2010)   SGOT (AST):  78 (07/11/2010)   Medications Prior to Update: 1)  Coreg 25 Mg Tabs (Carvedilol) .... One Tablet By Mouth Two Times A Day 2)   Plavix 75 Mg Tabs (Clopidogrel Bisulfate) .... One Tablet By Mouth Once Daily 3)  Lasix 40 Mg Tabs (Furosemide) .... One Tablet By Mouth Once Daily 4)  Imdur 60 Mg Xr24h-Tab (Isosorbide Mononitrate) .... One Tablet By Mouth Every Morning and 1/2 Tablet By Mouth At Bedtime 5)  Nitroglycerin 0.4 Mg Subl (Nitroglycerin) .... As Needed 6)  Spironolactone 25 Mg Tabs (Spironolactone) .... One Tablet By Mouth Once Daily 7)  Ambien 10 Mg Tabs (Zolpidem Tartrate) .... One Tablet By Mouth At Bedtime As Needed 8)  Fish Oil  Oil (Fish Oil) .Marland Kitchen.. 1200 Mg 1 Capsule Every Other Day 9)  Omeprazole 40 Mg Cpdr (Omeprazole) .... One Tablet By Mouth At Bedtime 10)  Lipitor 40 Mg Tabs (Atorvastatin Calcium) .... Take 1 Tablet By Mouth Once A Day 11)  Cordarone 200 Mg Tabs (Amiodarone Hcl) .Marland Kitchen.. 1 Tab Am and 1/2 Pm 12)  Onetouch Ultra Test  Strp (Glucose Blood) .... Test Qd 13)  Onetouch Ultrasoft Lancets  Misc (Lancets) .... Test Once Daily 14)  Ocuvite W/lutein 75mg  .... Once Daily 15)  Cranberry Fruit Extract 300mg  .... Take 1 Tablet By Mouth Once A Day 16)  Rapaflo 8 Mg Caps (Silodosin) .... One By Mouth Once Daily To Help Urine Flow 17)  Red Wine 18)  Hydrocodone-Homatropine 5-1.5 Mg/71ml Syrp (Hydrocodone-Homatropine) .Marland Kitchen.. 1 Tsp By Mouth Q 6 Hrs As Needed Cough 19)  Spiriva Handihaler 18 Mcg Caps (Tiotropium Bromide Monohydrate) .... One Puff Once Daily 20)  Mytussin Ac 100-10 Mg/9ml Syrp (Guaifenesin-Codeine) .... 5-10 Ml By Mouth Qid As Needed For Cough  Current Medications (verified): 1)  Coreg 25 Mg Tabs (Carvedilol) .... One Tablet By Mouth Two Times A Day 2)  Plavix 75 Mg Tabs (Clopidogrel Bisulfate) .... One Tablet By Mouth Once Daily 3)  Lasix 40 Mg Tabs (Furosemide) .... One Tablet By Mouth Once Daily 4)  Imdur 60 Mg Xr24h-Tab (Isosorbide Mononitrate) .... One Tablet By Mouth Every Morning and 1/2 Tablet By Mouth At Bedtime 5)  Nitroglycerin 0.4 Mg Subl (Nitroglycerin) .... As Needed 6)   Spironolactone 25 Mg Tabs (Spironolactone) .... One Tablet By Mouth Once Daily 7)  Ambien 10 Mg Tabs (Zolpidem Tartrate) .... One Tablet By Mouth At Bedtime As Needed 8)  Fish Oil  Oil (Fish Oil) .Marland Kitchen.. 1200 Mg 1 Capsule Every Other Day 9)  Omeprazole 40 Mg Cpdr (Omeprazole) .... One Tablet By Mouth  At Bedtime 10)  Lipitor 40 Mg Tabs (Atorvastatin Calcium) .... Take 1 Tablet By Mouth Once A Day 11)  Cordarone 200 Mg Tabs (Amiodarone Hcl) .Marland Kitchen.. 1 Tab Am and 1/2 Pm 12)  Onetouch Ultra Test  Strp (Glucose Blood) .... Test Qd 13)  Onetouch Ultrasoft Lancets  Misc (Lancets) .... Test Once Daily 14)  Ocuvite W/lutein 75mg  .... Once Daily 15)  Cranberry Fruit Extract 300mg  .... Take 1 Tablet By Mouth Once A Day 16)  Rapaflo 8 Mg Caps (Silodosin) .... One By Mouth Once Daily To Help Urine Flow 17)  Red Wine 18)  Spiriva Handihaler 18 Mcg Caps (Tiotropium Bromide Monohydrate) .... One Puff Once Daily 19)  Azithromycin 500 Mg Tabs (Azithromycin) .... One By Mouth Once Daily For 3 Days 20)  Hydrocodone-Homatropine 5-1.5 Mg Tabs (Hydrocodone-Homatropine) .... 5-10 Ml By Mouth Qid As Needed For Cough  Allergies (verified): 1)  ! Penicillin 2)  ! 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  Alcohol Use - no Illicit Drug Use - no  Risk Factors: Alcohol Use: 1 (12/29/2010) >5 drinks/d w/in last 3 months: no (12/29/2010)  Risk Factors: Smoking Status: quit (12/29/2010)  Family History: Reviewed history from 03/23/2009 and no changes required. Family History of Colon Cancer:Mother, sister Lung cancer: Maternal Grandmother, Maternal Grandfather Family History of Diabetes: siblings Family History of Heart Disease: siblings  Social History: Reviewed history from 03/23/2009 and no changes required. Married Occupation: retired Patient is a former smoker.  Alcohol Use - no Illicit Drug Use - no  Review of Systems  The patient denies anorexia, fever, weight loss, weight gain, chest pain, syncope, dyspnea on exertion, peripheral edema, headaches, hemoptysis, abdominal pain, hematuria, suspicious skin lesions, transient blindness, and enlarged lymph nodes.   Resp:  Denies chest discomfort, chest pain with inspiration, cough, coughing up blood, excessive snoring, pleuritic, shortness of breath, sputum productive, and wheezing.  Physical Exam  General:  alert, well-developed, well-nourished, well-hydrated, appropriate dress, normal appearance, healthy-appearing, cooperative to examination, good hygiene, and overweight-appearing.   Head:  normocephalic, atraumatic, no abnormalities observed, and no abnormalities palpated.   Eyes:  vision grossly intact, pupils equal, and pupils round.   Ears:  R ear normal and L ear normal.   Nose:  no external deformity, no airflow obstruction, no intranasal foreign body, no nasal polyps, no nasal mucosal lesions, no mucosal friability, no active bleeding or clots, nasal dischargemucosal pallor, mucosal edema, and R maxillary sinus tenderness.   Mouth:  Oral mucosa and oropharynx without lesions or exudates.  Teeth in good  repair. Neck:  supple, full ROM, no masses, no thyromegaly, no thyroid nodules or tenderness, no JVD, normal carotid upstroke, no carotid bruits, no cervical lymphadenopathy, and no neck tenderness.   Lungs:  normal respiratory effort, no intercostal retractions, no accessory muscle use, normal breath sounds, no dullness, no fremitus, no crackles, and no wheezes.  Heart:  normal rate, regular rhythm, no murmur, no gallop, and no rub.   Abdomen:  soft, non-tender, normal bowel sounds, no distention, no masses, no guarding, no rigidity, no rebound tenderness, no abdominal hernia, no inguinal hernia, no hepatomegaly, and no splenomegaly.   Msk:  No deformity or scoliosis noted of thoracic or lumbar spine.   Pulses:  R and L carotid,radial,femoral,dorsalis pedis and posterior tibial pulses are full and equal bilaterally Extremities:  no edema, no erythema  Neurologic:  No cranial nerve deficits noted. Station and gait are normal. Plantar reflexes are down-going bilaterally. DTRs are symmetrical throughout. Sensory, motor and coordinative functions appear intact. Skin:  turgor normal, color normal, no rashes, no suspicious lesions, no ecchymoses, no petechiae, no purpura, no ulcerations, and no edema.   Cervical Nodes:  no anterior cervical adenopathy and no posterior cervical adenopathy.   Axillary Nodes:  no R axillary adenopathy and no L axillary adenopathy.   Psych:  Oriented X3, memory intact for recent and remote, normally interactive, good eye contact, not anxious appearing, not depressed appearing, and not agitated.     Impression & Recommendations:  Problem # 1:  SINUSITIS- ACUTE-NOS (B9977251.9) Assessment New  The following medications were removed from the medication list:    Hydrocodone-homatropine 5-1.5 Mg/67ml Syrp (Hydrocodone-homatropine) .Marland Kitchen... 1 tsp by mouth q 6 hrs as needed cough    Mytussin Ac 100-10 Mg/78ml Syrp (Guaifenesin-codeine) .Marland Kitchen... 5-10 ml by mouth qid as needed for  cough His updated medication list for this problem includes:    Azithromycin 500 Mg Tabs (Azithromycin) ..... One by mouth once daily for 3 days    Hydrocodone-homatropine 5-1.5 Mg Tabs (Hydrocodone-homatropine) .Marland Kitchen... 5-10 ml by mouth qid as needed for cough  Problem # 2:  COPD, MILD (ICD-496) Assessment: Unchanged  His updated medication list for this problem includes:    Spiriva Handihaler 18 Mcg Caps (Tiotropium bromide monohydrate) ..... One puff once daily  Pulmonary Functions Reviewed: O2 sat: 97 (12/29/2010)     Vaccines Reviewed: Pneumovax: given (12/11/2006)   Flu Vax: Fluvax 3+ (08/24/2010)  Problem # 3:  HYPERTENSION (ICD-401.9) Assessment: Improved  His updated medication list for this problem includes:    Coreg 25 Mg Tabs (Carvedilol) ..... One tablet by mouth two times a day    Lasix 40 Mg Tabs (Furosemide) ..... One tablet by mouth once daily    Spironolactone 25 Mg Tabs (Spironolactone) ..... One tablet by mouth once daily  BP today: 122/68 Prior BP: 126/72 (10/14/2010)  Labs Reviewed: K+: 4.9 (07/11/2010) Creat: : 0.7 (07/11/2010)   Chol: 99 (07/11/2010)   HDL: 36.00 (07/11/2010)   LDL: 51 (07/11/2010)   TG: 58.0 (07/11/2010)  Complete Medication List: 1)  Coreg 25 Mg Tabs (Carvedilol) .... One tablet by mouth two times a day 2)  Plavix 75 Mg Tabs (Clopidogrel bisulfate) .... One tablet by mouth once daily 3)  Lasix 40 Mg Tabs (Furosemide) .... One tablet by mouth once daily 4)  Imdur 60 Mg Xr24h-tab (Isosorbide mononitrate) .... One tablet by mouth every morning and 1/2 tablet by mouth at bedtime 5)  Nitroglycerin 0.4 Mg Subl (Nitroglycerin) .... As needed 6)  Spironolactone 25 Mg Tabs (Spironolactone) .... One tablet by mouth once daily 7)  Ambien 10 Mg Tabs (Zolpidem tartrate) .... One tablet by mouth at bedtime as needed 8)  Fish Oil Oil (Fish oil) .Marland Kitchen.. 1200 mg 1 capsule every other day 9)  Omeprazole 40 Mg Cpdr (Omeprazole) .... One tablet by mouth at  bedtime  10)  Lipitor 40 Mg Tabs (Atorvastatin calcium) .... Take 1 tablet by mouth once a day 11)  Cordarone 200 Mg Tabs (Amiodarone hcl) .Marland Kitchen.. 1 tab am and 1/2 pm 12)  Onetouch Ultra Test Strp (Glucose blood) .... Test qd 13)  Onetouch Ultrasoft Lancets Misc (Lancets) .... Test once daily 14)  Ocuvite W/lutein 75mg   .... Once daily 15)  Cranberry Fruit Extract 300mg   .... Take 1 tablet by mouth once a day 16)  Rapaflo 8 Mg Caps (Silodosin) .... One by mouth once daily to help urine flow 17)  Red Wine  18)  Spiriva Handihaler 18 Mcg Caps (Tiotropium bromide monohydrate) .... One puff once daily 19)  Azithromycin 500 Mg Tabs (Azithromycin) .... One by mouth once daily for 3 days 20)  Hydrocodone-homatropine 5-1.5 Mg Tabs (Hydrocodone-homatropine) .... 5-10 ml by mouth qid as needed for cough  Patient Instructions: 1)  Please schedule a follow-up appointment in 2 weeks. 2)  Take your antibiotic as prescribed until ALL of it is gone, but stop if you develop a rash or swelling and contact our office as soon as possible. 3)  Acute sinusitis symptoms for less than 10 days are not helped by antibiotics.Use warm moist compresses, and over the counter decongestants ( only as directed). Call if no improvement in 5-7 days, sooner if increasing pain, fever, or new symptoms. Prescriptions: HYDROCODONE-HOMATROPINE 5-1.5 MG TABS (HYDROCODONE-HOMATROPINE) 5-10 ml by mouth QID as needed for cough  #8 ounces x 1   Entered and Authorized by:   [name] MD   Signed by:   [name] MD on 12/29/2010   Method used:   Print then Give to Patient   RxID:   [id number] AZITHROMYCIN 500 MG TABS (AZITHROMYCIN) One by mouth once daily for 3 days  #3 x 0   Entered and Authorized by:   [name] MD   Signed by:   [name] MD on 12/29/2010   Method used:   Electronically to        [facility] [id number]* (retail)       [address]       [place], [place]   [address]       Ph: [phone] or [phone]       Fax: [phone]   RxID:   [id number]    Orders Added: 1)  Est. Patient Level IV [id number]

## 2011-01-12 NOTE — Letter (Signed)
Summary: Results Follow-up Letter  [facility] [address]   [place], [place] [address]   Phone: [phone]  Fax: [phone]    07/12/2010  [place], [place]  [address]  [place]  Dear Mr. Furgason,   The following are the results of your recent test(s):  Test     Result     Thyroid     slightly overactive Liver enzymes   a little elevated CBC       normal Kidney     normal Prostate     normal Blood sugars   normal   _________________________________________________________  Please call for an appointment in 3-4 weeks _________________________________________________________ _________________________________________________________ _________________________________________________________  Sincerely,  [name] MD [facility]

## 2011-01-12 NOTE — Progress Notes (Signed)
Summary: Medication  Phone Note From Pharmacy   Caller: [facility]  [phone] Call For: Dr. Fuller Plan  Request: Speak with Nurse Summary of Call: Protonix is too expensive wants to know if they can switch to something else Initial call taken by: Webb Laws,  February 14, 2010 1:52 PM  Follow-up for Phone Call        Rx was sent to pts pharmacy.  Follow-up by: Marlon Pel CMA Deborra Medina),  February 14, 2010 2:12 PM    New/Updated Medications: OMEPRAZOLE 40 MG CPDR (OMEPRAZOLE) one tablet by mouth at bedtime Prescriptions: OMEPRAZOLE 40 MG CPDR (OMEPRAZOLE) one tablet by mouth at bedtime  #30 x 5   Entered by:   Marlon Pel CMA ([place])   Authorized by:   Ladene Artist MD [facility]   Signed by:   Marlon Pel CMA ([place]) on 02/14/2010   Method used:   Electronically to        [facility]. [id number]* (retail)       [address]       [place], [place]  [address]       Ph: [phone] or [phone]       Fax: [phone]   RxID:   [id number]

## 2011-01-12 NOTE — Letter (Signed)
Summary: [facility]  [facility]   Imported By: Phillis Knack 03/04/2010 10:11:24  _____________________________________________________________________  External Attachment:    Type:   Image     Comment:   External Document

## 2011-01-12 NOTE — Assessment & Plan Note (Signed)
Summary: cold sx NOT irmproving/congestions getting worse/cd   Vital Signs:  Patient profile:   65 year old male Height:      68 inches Weight:      227 pounds BMI:     34.64 O2 Sat:      96 % on Room air Temp:     98.2 degrees F oral Pulse rate:   54 / minute Pulse rhythm:   regular Resp:     16 per minute BP sitting:   126 / 72  (left arm) Cuff size:   large  Vitals Entered By: Estell Harpin CMA (October 14, 2010 9:38 AM)  Nutrition Counseling: Patient's BMI is greater than 25 and therefore counseled on weight management options.  O2 Flow:  Room air CC: patient c/o continued cough, congestion x several weeks, Cough Is Patient Diabetic? No Pain Assessment Patient in pain? no       Does patient need assistance? Functional Status Self care Ambulation Normal   Primary Care Provider:  [name] MD  CC:  patient c/o continued cough, congestion x several weeks, and Cough.  History of Present Illness:  Cough      This is a 65 year old man who presents with Cough.  The symptoms began 2 months ago.  The intensity is described as moderate.  The patient reports productive cough and non-productive cough, but denies pleuritic chest pain, shortness of breath, wheezing, exertional dyspnea, fever, hemoptysis, and malaise.  The patient denies the following symptoms: cold/URI symptoms, sore throat, nasal congestion, chronic rhinitis, weight loss, acid reflux symptoms, and peripheral edema.  Risk factors include recurrent sinus infections.    Preventive Screening-Counseling & Management  Alcohol-Tobacco     Alcohol drinks/day: 1     Alcohol type: wine     >5/day in last 3 mos: no     Alcohol Counseling: not indicated; use of alcohol is not excessive or problematic     Feels need to cut down: no     Feels annoyed by complaints: no     Feels guilty re: drinking: no     Needs 'eye opener' in am: no     Smoking Status: quit     Year Quit: 2004     Tobacco Counseling: to  remain off tobacco products  Hep-HIV-STD-Contraception     Hepatitis Risk: no risk noted     HIV Risk: no risk noted     STD Risk: no risk noted     TSE monthly: yes     Testicular SE Education/Counseling to perform regular STE      Sexual History:  currently monogamous.        Drug Use:  never.        Blood Transfusions:  no.    Clinical Review Panels:  Prevention   Last Colonoscopy:  Location:  [facility].  (04/27/2009)   Last PSA:  2.68 (07/11/2010)  Immunizations   Last Tetanus Booster:  Historical (12/11/2005)   Last Flu Vaccine:  Fluvax 3+ (08/24/2010)   Last Pneumovax:  given (12/11/2006)  Lipid Management   Cholesterol:  99 (07/11/2010)   LDL (bad choesterol):  51 (07/11/2010)   HDL (good cholesterol):  36.00 (07/11/2010)  Diabetes Management   HgBA1C:  5.5 (07/11/2010)   Creatinine:  0.7 (07/11/2010)   Last Flu Vaccine:  Fluvax 3+ (08/24/2010)   Last Pneumovax:  given (12/11/2006)  CBC   WBC:  5.1 (07/11/2010)   RBC:  4.34 (07/11/2010)  Hgb:  13.5 (07/11/2010)   Hct:  39.9 (07/11/2010)   Platelets:  139.0 (07/11/2010)   MCV  91.9 (07/11/2010)   MCHC  33.8 (07/11/2010)   RDW  14.0 (07/11/2010)   PMN:  66.3 (07/11/2010)   Lymphs:  14.5 (07/11/2010)   Monos:  12.1 (07/11/2010)   Eosinophils:  6.9 (07/11/2010)   Basophil:  0.2 (07/11/2010)  Complete Metabolic Panel   Glucose:  99 (07/11/2010)   Sodium:  139 (07/11/2010)   Potassium:  4.9 (07/11/2010)   Chloride:  104 (07/11/2010)   CO2:  29 (07/11/2010)   BUN:  16 (07/11/2010)   Creatinine:  0.7 (07/11/2010)   Albumin:  3.8 (07/11/2010)   Total Protein:  6.1 (07/11/2010)   Calcium:  9.0 (07/11/2010)   Total Bili:  1.1 (07/11/2010)   Alk Phos:  77 (07/11/2010)   SGPT (ALT):  136 (07/11/2010)   SGOT (AST):  78 (07/11/2010)   Medications Prior to Update: 1)  Coreg 25 Mg Tabs (Carvedilol) .... One Tablet By Mouth Two Times A Day 2)  Plavix 75 Mg Tabs (Clopidogrel Bisulfate) ....  One Tablet By Mouth Once Daily 3)  Lasix 40 Mg Tabs (Furosemide) .... One Tablet By Mouth Once Daily 4)  Imdur 60 Mg Xr24h-Tab (Isosorbide Mononitrate) .... One Tablet By Mouth Every Morning and 1/2 Tablet By Mouth At Bedtime 5)  Nitroglycerin 0.4 Mg Subl (Nitroglycerin) .... As Needed 6)  Spironolactone 25 Mg Tabs (Spironolactone) .... One Tablet By Mouth Once Daily 7)  Ramipril 2.5 Mg Caps (Ramipril) .... 2 Tablets By Mouth Every Morning and 1 Tablet By Mouth At Bedtime 8)  Ambien 10 Mg Tabs (Zolpidem Tartrate) .... One Tablet By Mouth At Bedtime As Needed 9)  Fish Oil  Oil (Fish Oil) .Marland Kitchen.. 1200 Mg 1 Capsule Every Other Day 10)  Omeprazole 40 Mg Cpdr (Omeprazole) .... One Tablet By Mouth At Bedtime 11)  Lipitor 40 Mg Tabs (Atorvastatin Calcium) .... Take 1 Tablet By Mouth Once A Day 12)  Cordarone 200 Mg Tabs (Amiodarone Hcl) .Marland Kitchen.. 1 Tab Am and 1/2 Pm 13)  Onetouch Ultra Test  Strp (Glucose Blood) .... Test Qd 14)  Onetouch Ultrasoft Lancets  Misc (Lancets) .... Test Once Daily 15)  Ocuvite W/lutein 75mg  .... Once Daily 16)  Cranberry Fruit Extract 300mg  .... Take 1 Tablet By Mouth Once A Day 17)  Rapaflo 8 Mg Caps (Silodosin) .... One By Mouth Once Daily To Help Urine Flow 18)  Red Wine 19)  Azithromycin 250 Mg Tabs (Azithromycin) .... 2po Qd For 1 Day, Then 1po Qd For 4days, Then Stop 20)  Hydrocodone-Homatropine 5-1.5 Mg/67ml Syrp (Hydrocodone-Homatropine) .Marland Kitchen.. 1 Tsp By Mouth Q 6 Hrs As Needed Cough 21)  Prednisone 10 Mg Tabs (Prednisone) .... 3po Qd For 3days, Then 2po Qd For 3days, Then 1po Qd For 3days, Then Stop  Current Medications (verified): 1)  Coreg 25 Mg Tabs (Carvedilol) .... One Tablet By Mouth Two Times A Day 2)  Plavix 75 Mg Tabs (Clopidogrel Bisulfate) .... One Tablet By Mouth Once Daily 3)  Lasix 40 Mg Tabs (Furosemide) .... One Tablet By Mouth Once Daily 4)  Imdur 60 Mg Xr24h-Tab (Isosorbide Mononitrate) .... One Tablet By Mouth Every Morning and 1/2 Tablet By Mouth At  Bedtime 5)  Nitroglycerin 0.4 Mg Subl (Nitroglycerin) .... As Needed 6)  Spironolactone 25 Mg Tabs (Spironolactone) .... One Tablet By Mouth Once Daily 7)  Ambien 10 Mg Tabs (Zolpidem Tartrate) .... One Tablet By Mouth At Bedtime As Needed 8)  Fish Oil  Oil (Fish Oil) .Marland Kitchen.. 1200 Mg 1 Capsule Every Other Day 9)  Omeprazole 40 Mg Cpdr (Omeprazole) .... One Tablet By Mouth At Bedtime 10)  Lipitor 40 Mg Tabs (Atorvastatin Calcium) .... Take 1 Tablet By Mouth Once A Day 11)  Cordarone 200 Mg Tabs (Amiodarone Hcl) .Marland Kitchen.. 1 Tab Am and 1/2 Pm 12)  Onetouch Ultra Test  Strp (Glucose Blood) .... Test Qd 13)  Onetouch Ultrasoft Lancets  Misc (Lancets) .... Test Once Daily 14)  Ocuvite W/lutein 75mg  .... Once Daily 15)  Cranberry Fruit Extract 300mg  .... Take 1 Tablet By Mouth Once A Day 16)  Rapaflo 8 Mg Caps (Silodosin) .... One By Mouth Once Daily To Help Urine Flow 17)  Red Wine 18)  Hydrocodone-Homatropine 5-1.5 Mg/72ml Syrp (Hydrocodone-Homatropine) .Marland Kitchen.. 1 Tsp By Mouth Q 6 Hrs As Needed Cough 19)  Spiriva Handihaler 18 Mcg Caps (Tiotropium Bromide Monohydrate) .... Take 1 Tablet By Mouth Once A Day 20)  Spiriva Handihaler 18 Mcg Caps (Tiotropium Bromide Monohydrate) .... One Puff Once Daily 21)  Avelox Abc Pack 400 Mg Tabs (Moxifloxacin Hcl) .... One By Mouth Once Daily For 5 Days 22)  Mytussin Ac 100-10 Mg/50ml Syrp (Guaifenesin-Codeine) .... 5-10 Ml By Mouth Qid As Needed For Cough  Allergies (verified): 1)  ! Penicillin 2)  ! 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  Alcohol Use - no Illicit Drug Use - no  Risk Factors: Alcohol Use: 1 (10/14/2010) >5 drinks/d w/in last 3 months: no (10/14/2010)  Risk Factors: Smoking Status: quit (10/14/2010)  Family History: Reviewed history from 03/23/2009 and no changes required. Family History of Colon Cancer:Mother, sister Lung cancer: Maternal Grandmother, Maternal Grandfather Family History of Diabetes: siblings Family History of Heart Disease: siblings  Social History: Reviewed history from 03/23/2009 and no changes required. Married Occupation: retired Patient is a former smoker.  Alcohol Use - no Illicit Drug Use - no  Review of Systems       The patient complains of prolonged cough.  The patient denies anorexia, fever, weight loss, weight gain, chest pain, syncope, dyspnea on exertion, peripheral edema, headaches, hemoptysis, abdominal pain, suspicious skin lesions, and enlarged lymph nodes.   Resp:  Complains of cough, shortness of breath, and sputum productive; denies chest discomfort, chest pain with inspiration, coughing up blood, excessive snoring, hypersomnolence, morning headaches, pleuritic, and wheezing.  Physical Exam  General:  alert, well-developed, well-nourished, well-hydrated, appropriate dress, normal appearance, healthy-appearing, cooperative to examination, good hygiene, and overweight-appearing.   Head:  normocephalic, atraumatic, no abnormalities observed, and no abnormalities palpated.   Ears:  R ear normal and L ear normal.   Nose:  External nasal  examination shows no deformity or inflammation. Nasal mucosa are pink and moist without lesions or exudates. Mouth:  Oral mucosa and oropharynx without lesions or exudates.  Teeth in good repair. Neck:  supple, full  ROM, no masses, no thyromegaly, no thyroid nodules or tenderness, no JVD, normal carotid upstroke, no carotid bruits, no cervical lymphadenopathy, and no neck tenderness.   Lungs:  normal respiratory effort, no intercostal retractions, no accessory muscle use, normal breath sounds, no dullness, no fremitus, no crackles, and no wheezes.   Heart:  normal rate, regular rhythm, no murmur, no gallop, and no rub.   Abdomen:  soft, non-tender, normal bowel sounds, no distention, no masses, no guarding, no rigidity, no rebound tenderness, no abdominal hernia, no inguinal hernia, no hepatomegaly, and no splenomegaly.   Msk:  No deformity or scoliosis noted of thoracic or lumbar spine.   Pulses:  R and L carotid,radial,femoral,dorsalis pedis and posterior tibial pulses are full and equal bilaterally Extremities:  no edema, no erythema  Neurologic:  No cranial nerve deficits noted. Station and gait are normal. Plantar reflexes are down-going bilaterally. DTRs are symmetrical throughout. Sensory, motor and coordinative functions appear intact. Skin:  turgor normal, color normal, no rashes, no suspicious lesions, no ecchymoses, no petechiae, no purpura, no ulcerations, and no edema.   Cervical Nodes:  no anterior cervical adenopathy and no posterior cervical adenopathy.   Psych:  Oriented X3, memory intact for recent and remote, normally interactive, good eye contact, not anxious appearing, not depressed appearing, and not agitated.     Impression & Recommendations:  Problem # 1:  COUGH (ICD-786.2) STOP THE ACEI, check for pna Orders: T-2 View CXR ([id number])  Problem # 2:  COPD, MILD (ICD-496) Assessment: New  The following medications were removed from the medication list:    Spiriva  Handihaler 18 Mcg Caps (Tiotropium bromide monohydrate) .Marland Kitchen... Take 1 tablet by mouth once a day His updated medication list for this problem includes:    Spiriva Handihaler 18 Mcg Caps (Tiotropium bromide monohydrate) ..... One puff once daily  Problem # 3:  BRONCHITIS-ACUTE (ICD-466.0) Assessment: New as predicted with COPD he has an infection that is resistnat to a macrolide The following medications were removed from the medication list:    Azithromycin 250 Mg Tabs (Azithromycin) .Marland Kitchen... 2po qd for 1 day, then 1po qd for 4days, then stop    Spiriva Handihaler 18 Mcg Caps (Tiotropium bromide monohydrate) .Marland Kitchen... Take 1 tablet by mouth once a day His updated medication list for this problem includes:    Hydrocodone-homatropine 5-1.5 Mg/32ml Syrp (Hydrocodone-homatropine) .Marland Kitchen... 1 tsp by mouth q 6 hrs as needed cough    Spiriva Handihaler 18 Mcg Caps (Tiotropium bromide monohydrate) ..... One puff once daily    Avelox Abc Pack 400 Mg Tabs (Moxifloxacin hcl) ..... One by mouth once daily for 5 days    Mytussin Ac 100-10 Mg/9ml Syrp (Guaifenesin-codeine) .Marland Kitchen... 5-10 ml by mouth qid as needed for cough  Problem # 4:  HYPERTENSION (ICD-401.9) Assessment: Unchanged  The following medications were removed from the medication list:    Ramipril 2.5 Mg Caps (Ramipril) .Marland Kitchen... 2 tablets by mouth every morning and 1 tablet by mouth at bedtime His updated medication list for this problem includes:    Coreg 25 Mg Tabs (Carvedilol) ..... One tablet by mouth two times a day    Lasix 40 Mg Tabs (Furosemide) ..... One tablet by mouth once daily    Spironolactone 25 Mg Tabs (Spironolactone) ..... One tablet by mouth once daily  BP today: 126/72 Prior BP: 124/60 (08/24/2010)  Labs Reviewed: K+: 4.9 (07/11/2010) Creat: : 0.7 (07/11/2010)   Chol: 99 (07/11/2010)   HDL: 36.00 (07/11/2010)   LDL: 51 (07/11/2010)  TG: 58.0 (07/11/2010)  Complete Medication List: 1)  Coreg 25 Mg Tabs (Carvedilol) .... One tablet by  mouth two times a day 2)  Plavix 75 Mg Tabs (Clopidogrel bisulfate) .... One tablet by mouth once daily 3)  Lasix 40 Mg Tabs (Furosemide) .... One tablet by mouth once daily 4)  Imdur 60 Mg Xr24h-tab (Isosorbide mononitrate) .... One tablet by mouth every morning and 1/2 tablet by mouth at bedtime 5)  Nitroglycerin 0.4 Mg Subl (Nitroglycerin) .... As needed 6)  Spironolactone 25 Mg Tabs (Spironolactone) .... One tablet by mouth once daily 7)  Ambien 10 Mg Tabs (Zolpidem tartrate) .... One tablet by mouth at bedtime as needed 8)  Fish Oil Oil (Fish oil) .Marland Kitchen.. 1200 mg 1 capsule every other day 9)  Omeprazole 40 Mg Cpdr (Omeprazole) .... One tablet by mouth at bedtime 10)  Lipitor 40 Mg Tabs (Atorvastatin calcium) .... Take 1 tablet by mouth once a day 11)  Cordarone 200 Mg Tabs (Amiodarone hcl) .Marland Kitchen.. 1 tab am and 1/2 pm 12)  Onetouch Ultra Test Strp (Glucose blood) .... Test qd 13)  Onetouch Ultrasoft Lancets Misc (Lancets) .... Test once daily 14)  Ocuvite W/lutein 75mg   .... Once daily 15)  Cranberry Fruit Extract 300mg   .... Take 1 tablet by mouth once a day 16)  Rapaflo 8 Mg Caps (Silodosin) .... One by mouth once daily to help urine flow 17)  Red Wine  18)  Hydrocodone-homatropine 5-1.5 Mg/59ml Syrp (Hydrocodone-homatropine) .Marland Kitchen.. 1 tsp by mouth q 6 hrs as needed cough 19)  Spiriva Handihaler 18 Mcg Caps (Tiotropium bromide monohydrate) .... One puff once daily 20)  Avelox Abc Pack 400 Mg Tabs (Moxifloxacin hcl) .... One by mouth once daily for 5 days 21)  Mytussin Ac 100-10 Mg/40ml Syrp (Guaifenesin-codeine) .... 5-10 ml by mouth qid as needed for cough  Patient Instructions: 1)  Please schedule a follow-up appointment in 1 month. 2)  Take your antibiotic as prescribed until ALL of it is gone, but stop if you develop a rash or swelling and contact our office as soon as possible. 3)  Acute bronchitis symptoms for less than 10 days are not helped by antibiotics. take over the counter cough  medications. call if no improvment in  5-7 days, sooner if increasing cough, fever, or new symptoms( shortness of breath, chest pain). Prescriptions: MYTUSSIN AC 100-10 MG/5ML SYRP (GUAIFENESIN-CODEINE) 5-10 ml by mouth QID as needed for cough  #8 ounces x 1   Entered and Authorized by:   [name] MD   Signed by:   [name] MD on 10/14/2010   Method used:   Print then Give to Patient   RxID:   [id number] AVELOX ABC PACK 400 MG TABS (MOXIFLOXACIN HCL) One by mouth once daily for 5 days  #5 x 0   Entered and Authorized by:   [name] MD   Signed by:   [name] MD on 10/14/2010   Method used:   Samples Given   RxID:   [id number] SPIRIVA HANDIHALER 18 MCG CAPS (TIOTROPIUM BROMIDE MONOHYDRATE) One puff once daily  #70 x 0   Entered and Authorized by:   [name] MD   Signed by:   [name] MD on 10/14/2010   Method used:   Telephoned to ...       [facility]  [address] [phone]* (retail)       [address]       [facility]  [place], [place]  [address]       Ph: [phone] or [phone]       Fax: [phone]   RxID:   [id number]    Orders Added: 1)  T-2 View CXR [[id number]] 2)  Est. Patient Level IV [id number]

## 2011-01-12 NOTE — Letter (Signed)
Summary: [facility]   [facility]   Imported By: Phillis Knack 08/25/2010 07:10:31  _____________________________________________________________________  External Attachment:    Type:   Image     Comment:   External Document

## 2011-01-12 NOTE — Letter (Signed)
Summary: Med list and Blood sugar  Med list and Blood sugar   Imported By: Bubba Hales 08/26/2010 13:24:17  _____________________________________________________________________  External Attachment:    Type:   Image     Comment:   External Document

## 2011-01-12 NOTE — Assessment & Plan Note (Signed)
Summary: cough/hoarseness/jones/cd   Vital Signs:  Patient profile:   65 year old male Height:      68 inches Weight:      226 pounds BMI:     34.49 O2 Sat:      94 % on Room air Temp:     97.8 degrees F oral Pulse rate:   59 / minute BP sitting:   124 / 60  (left arm) Cuff size:   large  Vitals Entered By: Shirlean Mylar Ewing CMA ([place]) (August 24, 2010 3:21 PM)  O2 Flow:  Room air CC: Cough, hoarseness/RE   Primary Care [facility] Schmiesing:  [name] MD  CC:  Cough and hoarseness/RE.  History of Present Illness: here with acute onset midl to mod x 3 days fever, sinus pain, pressure, greenish d/c, with mild ST and today worsening prod cough with brown-greenish sputum, mild sob and slight wheeing.  Pt denies CP, orthopnea, pnd, worsening LE edema, palps, dizziness or syncope  Pt denies new neuro symptoms such as headache, facial or extremity weakness  Denies polydipsia or polyuria.  Overall good complaince with meds, good tolerability. Has general mild weakness, but no gait change, falls or confusion.  Has good sense of humor today, and very supportive wife.  Problems Prior to Update: 1)  Bronchitis-acute  (ICD-466.0) 2)  Sinusitis- Acute-nos  (ICD-461.9) 3)  Psa, Increased  (ICD-790.93) 4)  Hypertension  (ICD-401.9) 5)  Hypertrophy Prostate W/ur Obst & Oth Luts  (ICD-600.01) 6)  Hyperglycemia, Borderline  (ICD-790.29) 7)  Back Pain  (ICD-724.5) 8)  Cad  (ICD-414.00) 9)  Colonic Polyps, Hx of  (ICD-V12.72) 10)  Gerd  (ICD-530.81) 11)  Other Dysphagia  (ICD-787.29)  Medications Prior to Update: 1)  Coreg 25 Mg Tabs (Carvedilol) .... One Tablet By Mouth Two Times A Day 2)  Plavix 75 Mg Tabs (Clopidogrel Bisulfate) .... One Tablet By Mouth Once Daily 3)  Lasix 40 Mg Tabs (Furosemide) .... One Tablet By Mouth Once Daily 4)  Imdur 60 Mg Xr24h-Tab (Isosorbide Mononitrate) .... One Tablet By Mouth Every Morning and 1/2 Tablet By Mouth At Bedtime 5)  Nitroglycerin 0.4 Mg Subl  (Nitroglycerin) .... As Needed 6)  Spironolactone 25 Mg Tabs (Spironolactone) .... One Tablet By Mouth Once Daily 7)  Ramipril 2.5 Mg Caps (Ramipril) .... 2 Tablets By Mouth Every Morning and 1 Tablet By Mouth At Bedtime 8)  Ambien 10 Mg Tabs (Zolpidem Tartrate) .... One Tablet By Mouth At Bedtime As Needed 9)  Fish Oil  Oil (Fish Oil) .Marland Kitchen.. 1200 Mg 1 Capsule Every Other Day 10)  Omeprazole 40 Mg Cpdr (Omeprazole) .... One Tablet By Mouth At Bedtime 11)  Lipitor 40 Mg Tabs (Atorvastatin Calcium) .... Take 1 Tablet By Mouth Once A Day 12)  Cordarone 200 Mg Tabs (Amiodarone Hcl) .Marland Kitchen.. 1 Tab Am and 1/2 Pm 13)  Onetouch Ultra Test  Strp (Glucose Blood) .... Test Qd 14)  Onetouch Ultrasoft Lancets  Misc (Lancets) .... Test Once Daily 15)  Ocuvite W/lutein 75mg  .... Once Daily 16)  Cranberry Fruit Extract 300mg  .... Take 1 Tablet By Mouth Once A Day 17)  Rapaflo 8 Mg Caps (Silodosin) .... One By Mouth Once Daily To Help Urine Flow 18)  Red Wine  Current Medications (verified): 1)  Coreg 25 Mg Tabs (Carvedilol) .... One Tablet By Mouth Two Times A Day 2)  Plavix 75 Mg Tabs (Clopidogrel Bisulfate) .... One Tablet By Mouth Once Daily 3)  Lasix 40 Mg Tabs (Furosemide) .... One Tablet  By Mouth Once Daily 4)  Imdur 60 Mg Xr24h-Tab (Isosorbide Mononitrate) .... One Tablet By Mouth Every Morning and 1/2 Tablet By Mouth At Bedtime 5)  Nitroglycerin 0.4 Mg Subl (Nitroglycerin) .... As Needed 6)  Spironolactone 25 Mg Tabs (Spironolactone) .... One Tablet By Mouth Once Daily 7)  Ramipril 2.5 Mg Caps (Ramipril) .... 2 Tablets By Mouth Every Morning and 1 Tablet By Mouth At Bedtime 8)  Ambien 10 Mg Tabs (Zolpidem Tartrate) .... One Tablet By Mouth At Bedtime As Needed 9)  Fish Oil  Oil (Fish Oil) .Marland Kitchen.. 1200 Mg 1 Capsule Every Other Day 10)  Omeprazole 40 Mg Cpdr (Omeprazole) .... One Tablet By Mouth At Bedtime 11)  Lipitor 40 Mg Tabs (Atorvastatin Calcium) .... Take 1 Tablet By Mouth Once A Day 12)  Cordarone  200 Mg Tabs (Amiodarone Hcl) .Marland Kitchen.. 1 Tab Am and 1/2 Pm 13)  Onetouch Ultra Test  Strp (Glucose Blood) .... Test Qd 14)  Onetouch Ultrasoft Lancets  Misc (Lancets) .... Test Once Daily 15)  Ocuvite W/lutein 75mg  .... Once Daily 16)  Cranberry Fruit Extract 300mg  .... Take 1 Tablet By Mouth Once A Day 17)  Rapaflo 8 Mg Caps (Silodosin) .... One By Mouth Once Daily To Help Urine Flow 18)  Red Wine 19)  Azithromycin 250 Mg Tabs (Azithromycin) .... 2po Qd For 1 Day, Then 1po Qd For 4days, Then Stop 20)  Hydrocodone-Homatropine 5-1.5 Mg/82ml Syrp (Hydrocodone-Homatropine) .Marland Kitchen.. 1 Tsp By Mouth Q 6 Hrs As Needed Cough 21)  Prednisone 10 Mg Tabs (Prednisone) .... 3po Qd For 3days, Then 2po Qd For 3days, Then 1po Qd For 3days, Then Stop  Allergies (verified): 1)  ! Penicillin  Past History:  Past Medical History: Last updated: 11/22/2009 AAA Cardiomyopathy Coronary Artery Disease, S/P stents Myocardial Infarctions Congestive Heart Failure Hyperlipidemia GERD Benign essential tremors  COPD Diabetes Diverticulosis Hemorrhoids Adenomatous Colon Polyps 01/2004 Anemia Sleep Apnea SB AVM on Capsule Endoscopy 12/2003 Hypertension  Past Surgical History: Last updated: 03/23/2009 Automatic implantable cardioverter-defribillator 2005 Bypass surgery CABG x2 Stent placements in chest, legs and kidneys  Social History: Last updated: 03/23/2009 Married Occupation: retired Patient is a former smoker.  Alcohol Use - no Illicit Drug Use - no  Risk Factors: Alcohol Use: 1 (08/04/2010) >5 drinks/d w/in last 3 months: no (08/04/2010)  Risk Factors: Smoking Status: quit (08/04/2010)  Review of Systems       all otherwise negative per pt -    Physical Exam  General:  alert and overweight-appearing.  , mild ill  Head:  normocephalic and atraumatic.   Eyes:  vision grossly intact, pupils equal, and pupils round.   Ears:  bilat tm's mild erythema, sinus tender bilat Nose:  nasal  dischargemucosal pallor and mucosal edema.   Mouth:  pharyngeal erythema and fair dentition.   Neck:  supple and no masses.   Chest Wall:  no masses.   Lungs:  normal respiratory effort, R decreased breath sounds, and L decreased breath sounds.  and trace bilat wheezes Heart:  normal rate and regular rhythm.   Extremities:  no edema, no erythema    Impression & Recommendations:  Problem # 1:  SINUSITIS- ACUTE-NOS (ICD-461.9)  His updated medication list for this problem includes:    Azithromycin 250 Mg Tabs (Azithromycin) .Marland Kitchen... 2po qd for 1 day, then 1po qd for 4days, then stop    Hydrocodone-homatropine 5-1.5 Mg/46ml Syrp (Hydrocodone-homatropine) .Marland Kitchen... 1 tsp by mouth q 6 hrs as needed cough treat as above, f/u any worsening  signs or symptoms   Problem # 2:  BRONCHITIS-ACUTE (ICD-466.0)  with trace wheezing  - for pred pack asd   His updated medication list for this problem includes:    Azithromycin 250 Mg Tabs (Azithromycin) .Marland Kitchen... 2po qd for 1 day, then 1po qd for 4days, then stop    Hydrocodone-homatropine 5-1.5 Mg/5ml Syrp (Hydrocodone-homatropine) .Marland Kitchen... 1 tsp by mouth q 6 hrs as needed cough  Problem # 3:  HYPERTENSION (ICD-401.9)  His updated medication list for this problem includes:    Coreg 25 Mg Tabs (Carvedilol) ..... One tablet by mouth two times a day    Lasix 40 Mg Tabs (Furosemide) ..... One tablet by mouth once daily    Spironolactone 25 Mg Tabs (Spironolactone) ..... One tablet by mouth once daily    Ramipril 2.5 Mg Caps (Ramipril) .Marland Kitchen... 2 tablets by mouth every morning and 1 tablet by mouth at bedtime stable overall by hx and exam, ok to continue meds/tx as is   BP today: 124/60 Prior BP: 102/58 (08/04/2010)  Labs Reviewed: K+: 4.9 (07/11/2010) Creat: : 0.7 (07/11/2010)   Chol: 99 (07/11/2010)   HDL: 36.00 (07/11/2010)   LDL: 51 (07/11/2010)   TG: 58.0 (07/11/2010)  Complete Medication List: 1)  Coreg 25 Mg Tabs (Carvedilol) .... One tablet by mouth two  times a day 2)  Plavix 75 Mg Tabs (Clopidogrel bisulfate) .... One tablet by mouth once daily 3)  Lasix 40 Mg Tabs (Furosemide) .... One tablet by mouth once daily 4)  Imdur 60 Mg Xr24h-tab (Isosorbide mononitrate) .... One tablet by mouth every morning and 1/2 tablet by mouth at bedtime 5)  Nitroglycerin 0.4 Mg Subl (Nitroglycerin) .... As needed 6)  Spironolactone 25 Mg Tabs (Spironolactone) .... One tablet by mouth once daily 7)  Ramipril 2.5 Mg Caps (Ramipril) .... 2 tablets by mouth every morning and 1 tablet by mouth at bedtime 8)  Ambien 10 Mg Tabs (Zolpidem tartrate) .... One tablet by mouth at bedtime as needed 9)  Fish Oil Oil (Fish oil) .Marland Kitchen.. 1200 mg 1 capsule every other day 10)  Omeprazole 40 Mg Cpdr (Omeprazole) .... One tablet by mouth at bedtime 11)  Lipitor 40 Mg Tabs (Atorvastatin calcium) .... Take 1 tablet by mouth once a day 12)  Cordarone 200 Mg Tabs (Amiodarone hcl) .Marland Kitchen.. 1 tab am and 1/2 pm 13)  Onetouch Ultra Test Strp (Glucose blood) .... Test qd 14)  Onetouch Ultrasoft Lancets Misc (Lancets) .... Test once daily 15)  Ocuvite W/lutein 75mg   .... Once daily 16)  Cranberry Fruit Extract 300mg   .... Take 1 tablet by mouth once a day 17)  Rapaflo 8 Mg Caps (Silodosin) .... One by mouth once daily to help urine flow 18)  Red Wine  19)  Azithromycin 250 Mg Tabs (Azithromycin) .... 2po qd for 1 day, then 1po qd for 4days, then stop 20)  Hydrocodone-homatropine 5-1.5 Mg/35ml Syrp (Hydrocodone-homatropine) .Marland Kitchen.. 1 tsp by mouth q 6 hrs as needed cough 21)  Prednisone 10 Mg Tabs (Prednisone) .... 3po qd for 3days, then 2po qd for 3days, then 1po qd for 3days, then stop  Other Orders: Flu Vaccine 17yrs + MEDICARE PATIENTS [id number]) Administration Flu vaccine - MCR [id number])  Patient Instructions: 1)  Please take all new medications as prescribed 2)  Continue all previous medications as before this visit  3)  You can also use Mucinex OTC or it's generic for congestion 4)  You had  the flu shot today 5)  Please schedule an appointment  with your primary doctor as needed Prescriptions: PREDNISONE 10 MG TABS (PREDNISONE) 3po qd for 3days, then 2po qd for 3days, then 1po qd for 3days, then stop  #18 x 0   Entered and Authorized by:   Biagio Borg MD   Signed by:   Biagio Borg MD on 08/24/2010   Method used:   Print then Give to Patient   RxID:   [id number] [place] 5-1.5 MG/5ML SYRP (HYDROCODONE-HOMATROPINE) 1 tsp by mouth q 6 hrs as needed cough  #6 oz x 1   Entered and Authorized by:   Biagio Borg MD   Signed by:   Biagio Borg MD on 08/24/2010   Method used:   Print then Give to Patient   RxID:   [id number] AZITHROMYCIN 250 MG TABS (AZITHROMYCIN) 2po qd for 1 day, then 1po qd for 4days, then stop  #6 x 1   Entered and Authorized by:   Biagio Borg MD   Signed by:   Biagio Borg MD on 08/24/2010   Method used:   Print then Give to Patient   [id number]     Flu Vaccine Consent Questions     Do you have a history of severe allergic reactions to this vaccine? no    Any prior history of allergic reactions to egg and/or gelatin? no    Do you have a sensitivity to the preservative Thimersol? no    Do you have a past history of Guillan-Barre Syndrome? no    Do you currently have an acute febrile illness? no    Have you ever had a severe reaction to latex? no    Vaccine information given and explained to patient? yes    Are you currently pregnant? no    Lot Number:[id number]   Exp Date:06/10/2011   Site Given  Left Deltoid IMlu

## 2011-01-12 NOTE — Letter (Signed)
Summary: [facility]  [facility]   Imported By: Phillis Knack 12/09/2010 09:41:58  _____________________________________________________________________  External Attachment:    Type:   Image     Comment:   External Document

## 2011-01-12 NOTE — Letter (Signed)
Summary: [facility]  [facility]   Imported By: Phillis Knack 09/12/2010 07:56:21  _____________________________________________________________________  External Attachment:    Type:   Image     Comment:   External Document

## 2011-01-12 NOTE — Letter (Signed)
Summary: Lipid Letter  [facility]   [place], [place] [address]   Phone: [phone]  Fax: [phone]    07/12/2010  Quevin Huesman [address] [place], [place]  [address]  Dear Annalee Genta:  We have carefully reviewed your last lipid profile from 07/11/2010 and the results are noted below with a summary of recommendations for lipid management.    Cholesterol:       99     Goal: <200   HDL "good" Cholesterol:   36.00     Goal: >40   LDL "bad" Cholesterol:   51     Goal: <100   Triglycerides:       58.0     Goal: <150        TLC Diet (Therapeutic Lifestyle Change): Saturated Fats & Transfatty acids should be kept < 7% of total calories ***Reduce Saturated Fats Polyunstaurated Fat can be up to 10% of total calories Monounsaturated Fat Fat can be up to 20% of total calories Total Fat should be no greater than 25-35% of total calories Carbohydrates should be 50-60% of total calories Protein should be approximately 15% of total calories Fiber should be at least 20-30 grams a day ***Increased fiber may help lower LDL Total Cholesterol should be < 200mg /day Consider adding plant stanol/sterols to diet (example: Benacol spread) ***A higher intake of unsaturated fat may reduce Triglycerides and Increase HDL    Adjunctive Measures (may lower LIPIDS and reduce risk of Heart Attack) include: Aerobic Exercise (20-30 minutes 3-4 times a week) Limit Alcohol Consumption Weight Reduction Aspirin 75-81 mg a day by mouth (if not allergic or contraindicated) Dietary Fiber 20-30 grams a day by mouth     Current Medications: 1)    Coreg 25 Mg Tabs (Carvedilol) .... One tablet by mouth two times a day 2)    Plavix 75 Mg Tabs (Clopidogrel bisulfate) .... One tablet by mouth once daily 3)    Lasix 40 Mg Tabs (Furosemide) .... One tablet by mouth once daily 4)    Imdur 60 Mg Xr24h-tab (Isosorbide mononitrate) .... One tablet by mouth every morning and 1/2 tablet by  mouth at bedtime 5)    Nitroglycerin 0.4 Mg Subl (Nitroglycerin) .... As needed 6)    Spironolactone 25 Mg Tabs (Spironolactone) .... One tablet by mouth once daily 7)    Ramipril 2.5 Mg Caps (Ramipril) .... 2 tablets by mouth every morning and 1 tablet by mouth at bedtime 8)    Ambien 10 Mg Tabs (Zolpidem tartrate) .... One tablet by mouth at bedtime as needed 9)    Fish Oil  Oil (Fish oil) .Marland Kitchen.. 1200 mg 1 capsule every other day 10)    Omeprazole 40 Mg Cpdr (Omeprazole) .... One tablet by mouth at bedtime 11)    Lipitor 40 Mg Tabs (Atorvastatin calcium) .... Take 1 tablet by mouth once a day 12)    Cordarone 200 Mg Tabs (Amiodarone hcl) .Marland Kitchen.. 1 tab am and 1/2 pm 13)    Onetouch Ultra Test  Strp (Glucose blood) .... Test qd 14)    Onetouch Ultrasoft Lancets  Misc (Lancets) .... Test once daily 15)    Ocuvite W/lutein 75mg   .... Once daily 16)    Cranberry Fruit Extract 300mg   .... Take 1 tablet by mouth once a day 17)    Rapaflo 8 Mg Caps (Silodosin) .... One by mouth once daily to help urine flow  If you have any questions, please call. We appreciate  being able to work with you.   Sincerely,     [facility] [name] MD

## 2011-01-18 NOTE — Progress Notes (Signed)
Summary: REQ FOR RX  Phone Note Call from Patient Call back at Home Phone [phone] Call back at or [phone]   Summary of Call: Patient is requesting rx for "second round" of antibiotics. He completed antibiotic given at office visit and does not feel any better. Please advise.  Initial call taken by: Charlsie Quest, CMA,  January 09, 2011 3:39 PM    New/Updated Medications: AVELOX 400 MG TABS (MOXIFLOXACIN HCL) One by mouth once daily for 7 days Prescriptions: AVELOX 400 MG TABS (MOXIFLOXACIN HCL) One by mouth once daily for 7 days  #7 x 1   Entered and Authorized by:   [name] MD   Signed by:   [name] MD on 01/09/2011   Method used:   Electronically to        [facility]. [id number] (retail)       [place].       [place], [place]  [address]       Ph: [phone] or [phone]       Fax: [phone]   RxID:   [id number]   Appended Document: REQ FOR RX Pt informed

## 2011-02-27 LAB — GLUCOSE, CAPILLARY
Glucose-Capillary: 104 mg/dL — ABNORMAL HIGH (ref 70–99)
Glucose-Capillary: 119 mg/dL — ABNORMAL HIGH (ref 70–99)

## 2011-02-27 LAB — SURGICAL PCR SCREEN
MRSA, PCR: NEGATIVE
Staphylococcus aureus: NEGATIVE

## 2011-02-28 LAB — BASIC METABOLIC PANEL
BUN: 11 mg/dL (ref 6–23)
CO2: 26 mEq/L (ref 19–32)
Calcium: 9 mg/dL (ref 8.4–10.5)
Chloride: 105 mEq/L (ref 96–112)
Creatinine, Ser: 0.73 mg/dL (ref 0.4–1.5)
GFR calc Af Amer: >60 mL/min (ref >60)
GFR calc non Af Amer: >60 mL/min (ref >60)
Glucose, Bld: 99 mg/dL (ref 70–99)
Potassium: 4.5 mEq/L (ref 3.5–5.1)
Sodium: 137 mEq/L (ref 135–145)

## 2011-02-28 LAB — CBC
HCT: 40.4 % (ref 39.0–52.0)
Hemoglobin: 13.8 g/dL (ref 13.0–17.0)
MCHC: 34.1 g/dL (ref 30.0–36.0)
MCV: 92.7 fL (ref 78.0–100.0)
Platelets: 159 10*3/uL (ref 150–400)
RBC: 4.36 MIL/uL (ref 4.22–5.81)
RDW: 13.7 % (ref 11.5–15.5)
WBC: 5.5 10*3/uL (ref 4.0–10.5)

## 2011-02-28 LAB — DIFFERENTIAL
Basophils Absolute: 0 10*3/uL (ref 0.0–0.1)
Basophils Relative: 0 % (ref 0–1)
Eosinophils Absolute: 0.2 10*3/uL (ref 0.0–0.7)
Eosinophils Relative: 5 % (ref 0–5)
Lymphocytes Relative: 13 % (ref 12–46)
Lymphs Abs: 0.7 10*3/uL (ref 0.7–4.0)
Monocytes Absolute: 0.5 10*3/uL (ref 0.1–1.0)
Monocytes Relative: 9 % (ref 3–12)
Neutro Abs: 4 10*3/uL (ref 1.7–7.7)
Neutrophils Relative %: 73 % (ref 43–77)

## 2011-02-28 LAB — POCT CARDIAC MARKERS
CKMB, poc: <1 ng/mL — ABNORMAL LOW (ref 1.0–8.0)
Myoglobin, poc: 57.8 ng/mL (ref 12–200)
Troponin i, poc: <0.05 ng/mL (ref 0.00–0.09)

## 2011-03-16 ENCOUNTER — Encounter: Payer: Self-pay | Admitting: Internal Medicine

## 2011-03-16 ENCOUNTER — Ambulatory Visit (INDEPENDENT_AMBULATORY_CARE_PROVIDER_SITE_OTHER): Payer: Medicare Other | Admitting: Internal Medicine

## 2011-03-16 VITALS — BP 104/68 | HR 49 | Temp 97.8°F | Ht 68.0 in | Wt 239.2 lb

## 2011-03-16 DIAGNOSIS — J019 Acute sinusitis, unspecified: Secondary | ICD-10-CM

## 2011-03-16 DIAGNOSIS — J209 Acute bronchitis, unspecified: Secondary | ICD-10-CM

## 2011-03-16 MED ORDER — CEFUROXIME AXETIL 500 MG PO TABS: 500.0000 mg | ORAL_TABLET | Freq: Two times a day (BID) | ORAL | Status: DC

## 2011-03-16 MED ORDER — HYDROCODONE-HOMATROPINE 5-1.5 MG/5ML PO SYRP: 5.0000 mL | ORAL_SOLUTION | Freq: Four times a day (QID) | ORAL | Status: DC | PRN

## 2011-03-16 NOTE — Progress Notes (Signed)
Subjective:     [name] is a 65 y.o. male who presents for evaluation of symptoms of a URI. Symptoms include congestion, productive cough with  yellow colored sputum, purulent nasal discharge, sinus pressure and sore throat. Onset of symptoms was 2 days ago, and has been unchanged since that time. Treatment to date: none.  The following portions of the patient's history were reviewed and updated as appropriate: allergies, current medications, past family history, past medical history, past social history, past surgical history and problem list.  Review of Systems Constitutional: positive for malaise, negative for anorexia, chills, fatigue, fevers, night sweats, sweats and weight loss Ears, nose, mouth, throat, and face: positive for nasal congestion and sore throat, negative for ear drainage, earaches, epistaxis, facial trauma, hearing loss, hoarseness, snoring, tinnitus and voice change Respiratory: positive for cough and sputum, negative for asthma, chronic bronchitis, dyspnea on exertion, emphysema, hemoptysis, pleurisy/chest pain, pneumonia, stridor and wheezing   Objective:    BP 104/68  Pulse 49  Temp(Src) 97.8 F (36.6 C) (Oral)  Ht 5\' 8"  (1.727 m)  Wt 239 lb 4 oz (108.523 kg)  BMI 36.38 kg/m2  SpO2 95% General appearance: alert, cooperative, no distress and mildly obese Head: Normocephalic, without obvious abnormality, atraumatic Eyes: conjunctivae/corneas clear. PERRL, EOM's intact. Fundi benign. Ears: normal TM's and external ear canals both ears Nose: Nares normal. Septum midline. Mucosa normal. No drainage or sinus tenderness. Throat: lips, mucosa, and tongue normal; teeth and gums normal Neck: no adenopathy, no carotid bruit, no JVD, supple, symmetrical, trachea midline and thyroid not enlarged, symmetric, no tenderness/mass/nodules Lungs: clear to auscultation bilaterally Chest wall: no tenderness Abdomen: soft, non-tender; bowel sounds normal; no masses,  no  organomegaly Extremities: extremities normal, atraumatic, no cyanosis or edema Skin: Skin color, texture, turgor normal. No rashes or lesions Lymph nodes: Cervical, supraclavicular, and axillary nodes normal.   Assessment:    bronchitis and viral upper respiratory illness   Plan:    Discussed diagnosis and treatment of URI. Suggested symptomatic OTC remedies. Nasal saline spray for congestion. Follow up as needed. Call in 3 days if symptoms aren't resolving. Start Ceftin for infection and cough suppressant as well.

## 2011-03-16 NOTE — Patient Instructions (Signed)
Bronchitis Bronchitis is the body's way of reacting to injury and/or infection (inflammation) of the bronchi. Bronchi are the air tubes that extend from the windpipe into the lungs. If the inflammation becomes severe, it may cause shortness of breath.  CAUSES Inflammation may be caused by:  A virus.   Germs (bacteria).   Dust.   Allergens.   Pollutants and many other irritants.  The cells lining the bronchial tree are covered with tiny hairs (cilia). These constantly beat upward, away from the lungs, toward the mouth. This keeps the lungs free of pollutants. When these cells become too irritated and are unable to do their job, mucus begins to develop. This causes the characteristic cough of bronchitis. The cough clears the lungs when the cilia are unable to do their job. Without either of these protective mechanisms, the mucus would settle in the lungs. Then you would develop pneumonia. Smoking is a common cause of bronchitis and can contribute to pneumonia. Stopping this habit is the single most important thing you can do to help yourself. TREATMENT  Your caregiver may prescribe an antibiotic if the cough is caused by bacteria. Also, medicines that open up your airways make it easier to breathe. Your caregiver may also recommend or prescribe an expectorant. It will loosen the mucus to be coughed up. Only take over-the-counter or prescription medicines for pain, discomfort, or fever as directed by your caregiver.   Removing whatever causes the problem (smoking, for example) is critical to preventing the problem from getting worse.   Cough suppressants may be prescribed for relief of cough symptoms.   Inhaled medicines may be prescribed to help with symptoms now and to help prevent problems from returning.   For those with recurrent (chronic) bronchitis, there may be a need for steroid medicines.  SEEK IMMEDIATE MEDICAL CARE IF:  During treatment, you develop more pus-like mucus  (purulent sputum).   You or your child has an oral temperature above 100.5, not controlled by medicine.   Your baby is older than 3 months with a rectal temperature of 102 F (38.9 C) or higher.   Your baby is 42 months old or younger with a rectal temperature of 100.4 F (38 C) or higher.   You become progressively more ill.   You have increased difficulty breathing, wheezing, or shortness of breath.  It is necessary to seek immediate medical care if you are elderly or sick from any other disease. MAKE SURE YOU:  Understand these instructions.   Will watch your condition.   Will get help right away if you are not doing well or get worse.  Document Released: 11/27/2005 Document Re-Released: 02/21/2010 [facility] Patient Information 2011 [place].

## 2011-03-16 NOTE — Assessment & Plan Note (Signed)
Start ceftin 

## 2011-03-27 LAB — GLUCOSE, CAPILLARY
Glucose-Capillary: 101 mg/dL — ABNORMAL HIGH (ref 70–99)
Glucose-Capillary: 102 mg/dL — ABNORMAL HIGH (ref 70–99)
Glucose-Capillary: 108 mg/dL — ABNORMAL HIGH (ref 70–99)
Glucose-Capillary: 113 mg/dL — ABNORMAL HIGH (ref 70–99)
Glucose-Capillary: 117 mg/dL — ABNORMAL HIGH (ref 70–99)
Glucose-Capillary: 119 mg/dL — ABNORMAL HIGH (ref 70–99)
Glucose-Capillary: 120 mg/dL — ABNORMAL HIGH (ref 70–99)
Glucose-Capillary: 143 mg/dL — ABNORMAL HIGH (ref 70–99)
Glucose-Capillary: 148 mg/dL — ABNORMAL HIGH (ref 70–99)
Glucose-Capillary: 156 mg/dL — ABNORMAL HIGH (ref 70–99)
Glucose-Capillary: 68 mg/dL — ABNORMAL LOW (ref 70–99)
Glucose-Capillary: 83 mg/dL (ref 70–99)
Glucose-Capillary: 85 mg/dL (ref 70–99)
Glucose-Capillary: 85 mg/dL (ref 70–99)
Glucose-Capillary: 87 mg/dL (ref 70–99)
Glucose-Capillary: 87 mg/dL (ref 70–99)
Glucose-Capillary: 88 mg/dL (ref 70–99)
Glucose-Capillary: 89 mg/dL (ref 70–99)
Glucose-Capillary: 91 mg/dL (ref 70–99)
Glucose-Capillary: 92 mg/dL (ref 70–99)
Glucose-Capillary: 95 mg/dL (ref 70–99)
Glucose-Capillary: 95 mg/dL (ref 70–99)
Glucose-Capillary: 96 mg/dL (ref 70–99)
Glucose-Capillary: 96 mg/dL (ref 70–99)

## 2011-03-27 LAB — COMPREHENSIVE METABOLIC PANEL
ALT: 215 U/L — ABNORMAL HIGH (ref 0–53)
ALT: 294 U/L — ABNORMAL HIGH (ref 0–53)
ALT: 376 U/L — ABNORMAL HIGH (ref 0–53)
ALT: 622 U/L — ABNORMAL HIGH (ref 0–53)
ALT: 708 U/L — ABNORMAL HIGH (ref 0–53)
AST: 202 U/L — ABNORMAL HIGH (ref 0–37)
AST: 46 U/L — ABNORMAL HIGH (ref 0–37)
AST: 540 U/L — ABNORMAL HIGH (ref 0–37)
AST: 58 U/L — ABNORMAL HIGH (ref 0–37)
AST: 87 U/L — ABNORMAL HIGH (ref 0–37)
Albumin: 2.6 g/dL — ABNORMAL LOW (ref 3.5–5.2)
Albumin: 2.6 g/dL — ABNORMAL LOW (ref 3.5–5.2)
Albumin: 2.8 g/dL — ABNORMAL LOW (ref 3.5–5.2)
Albumin: 2.8 g/dL — ABNORMAL LOW (ref 3.5–5.2)
Albumin: 2.9 g/dL — ABNORMAL LOW (ref 3.5–5.2)
Alkaline Phosphatase: 47 U/L (ref 39–117)
Alkaline Phosphatase: 53 U/L (ref 39–117)
Alkaline Phosphatase: 55 U/L (ref 39–117)
Alkaline Phosphatase: 56 U/L (ref 39–117)
Alkaline Phosphatase: 59 U/L (ref 39–117)
BUN: 10 mg/dL (ref 6–23)
BUN: 11 mg/dL (ref 6–23)
BUN: 11 mg/dL (ref 6–23)
BUN: 20 mg/dL (ref 6–23)
BUN: 9 mg/dL (ref 6–23)
CO2: 25 mEq/L (ref 19–32)
CO2: 25 mEq/L (ref 19–32)
CO2: 25 mEq/L (ref 19–32)
CO2: 26 mEq/L (ref 19–32)
CO2: 26 mEq/L (ref 19–32)
Calcium: 8.1 mg/dL — ABNORMAL LOW (ref 8.4–10.5)
Calcium: 8.1 mg/dL — ABNORMAL LOW (ref 8.4–10.5)
Calcium: 8.3 mg/dL — ABNORMAL LOW (ref 8.4–10.5)
Calcium: 8.4 mg/dL (ref 8.4–10.5)
Calcium: 8.9 mg/dL (ref 8.4–10.5)
Chloride: 105 mEq/L (ref 96–112)
Chloride: 106 mEq/L (ref 96–112)
Chloride: 109 mEq/L (ref 96–112)
Chloride: 109 mEq/L (ref 96–112)
Chloride: 111 mEq/L (ref 96–112)
Creatinine, Ser: 0.59 mg/dL (ref 0.4–1.5)
Creatinine, Ser: 0.67 mg/dL (ref 0.4–1.5)
Creatinine, Ser: 0.75 mg/dL (ref 0.4–1.5)
Creatinine, Ser: 0.8 mg/dL (ref 0.4–1.5)
Creatinine, Ser: 0.81 mg/dL (ref 0.4–1.5)
GFR calc Af Amer: >60 mL/min (ref >60)
GFR calc Af Amer: >60 mL/min (ref >60)
GFR calc Af Amer: >60 mL/min (ref >60)
GFR calc Af Amer: >60 mL/min (ref >60)
GFR calc Af Amer: >60 mL/min (ref >60)
GFR calc non Af Amer: >60 mL/min (ref >60)
GFR calc non Af Amer: >60 mL/min (ref >60)
GFR calc non Af Amer: >60 mL/min (ref >60)
GFR calc non Af Amer: >60 mL/min (ref >60)
GFR calc non Af Amer: >60 mL/min (ref >60)
Glucose, Bld: 140 mg/dL — ABNORMAL HIGH (ref 70–99)
Glucose, Bld: 84 mg/dL (ref 70–99)
Glucose, Bld: 84 mg/dL (ref 70–99)
Glucose, Bld: 88 mg/dL (ref 70–99)
Glucose, Bld: 97 mg/dL (ref 70–99)
Potassium: 3.2 mEq/L — ABNORMAL LOW (ref 3.5–5.1)
Potassium: 3.4 mEq/L — ABNORMAL LOW (ref 3.5–5.1)
Potassium: 3.4 mEq/L — ABNORMAL LOW (ref 3.5–5.1)
Potassium: 3.9 mEq/L (ref 3.5–5.1)
Potassium: 4.2 mEq/L (ref 3.5–5.1)
Sodium: 137 mEq/L (ref 135–145)
Sodium: 137 mEq/L (ref 135–145)
Sodium: 141 mEq/L (ref 135–145)
Sodium: 141 mEq/L (ref 135–145)
Sodium: 143 mEq/L (ref 135–145)
Total Bilirubin: 0.6 mg/dL (ref 0.3–1.2)
Total Bilirubin: 0.7 mg/dL (ref 0.3–1.2)
Total Bilirubin: 0.8 mg/dL (ref 0.3–1.2)
Total Bilirubin: 1 mg/dL (ref 0.3–1.2)
Total Bilirubin: 1.4 mg/dL — ABNORMAL HIGH (ref 0.3–1.2)
Total Protein: 5 g/dL — ABNORMAL LOW (ref 6.0–8.3)
Total Protein: 5.1 g/dL — ABNORMAL LOW (ref 6.0–8.3)
Total Protein: 5.2 g/dL — ABNORMAL LOW (ref 6.0–8.3)
Total Protein: 5.4 g/dL — ABNORMAL LOW (ref 6.0–8.3)
Total Protein: 5.4 g/dL — ABNORMAL LOW (ref 6.0–8.3)

## 2011-03-27 LAB — CULTURE, BLOOD (ROUTINE X 2): Culture: NO GROWTH

## 2011-03-27 LAB — URINALYSIS, ROUTINE W REFLEX MICROSCOPIC
Bilirubin Urine: NEGATIVE
Glucose, UA: NEGATIVE mg/dL
Hgb urine dipstick: NEGATIVE
Ketones, ur: NEGATIVE mg/dL
Nitrite: NEGATIVE
Protein, ur: NEGATIVE mg/dL
Specific Gravity, Urine: 1.02 (ref 1.005–1.030)
Urobilinogen, UA: >8 mg/dL — ABNORMAL HIGH (ref 0.0–1.0)
pH: 6 (ref 5.0–8.0)

## 2011-03-27 LAB — URINE CULTURE
Colony Count: NO GROWTH
Culture: NO GROWTH

## 2011-03-27 LAB — CK TOTAL AND CKMB (NOT AT ARMC)
CK, MB: 0.8 ng/mL (ref 0.3–4.0)
CK, MB: 0.9 ng/mL (ref 0.3–4.0)
CK, MB: 1.1 ng/mL (ref 0.3–4.0)
Relative Index: INVALID (ref 0.0–2.5)
Relative Index: INVALID (ref 0.0–2.5)
Relative Index: INVALID (ref 0.0–2.5)
Total CK: 34 U/L (ref 7–232)
Total CK: 38 U/L (ref 7–232)
Total CK: 53 U/L (ref 7–232)

## 2011-03-27 LAB — CBC
HCT: 35.9 % — ABNORMAL LOW (ref 39.0–52.0)
HCT: 36.1 % — ABNORMAL LOW (ref 39.0–52.0)
HCT: 36.3 % — ABNORMAL LOW (ref 39.0–52.0)
HCT: 38 % — ABNORMAL LOW (ref 39.0–52.0)
Hemoglobin: 12 g/dL — ABNORMAL LOW (ref 13.0–17.0)
Hemoglobin: 12.1 g/dL — ABNORMAL LOW (ref 13.0–17.0)
Hemoglobin: 12.2 g/dL — ABNORMAL LOW (ref 13.0–17.0)
Hemoglobin: 12.8 g/dL — ABNORMAL LOW (ref 13.0–17.0)
MCHC: 33.3 g/dL (ref 30.0–36.0)
MCHC: 33.6 g/dL (ref 30.0–36.0)
MCHC: 33.7 g/dL (ref 30.0–36.0)
MCHC: 33.8 g/dL (ref 30.0–36.0)
MCV: 92.3 fL (ref 78.0–100.0)
MCV: 92.3 fL (ref 78.0–100.0)
MCV: 92.9 fL (ref 78.0–100.0)
MCV: 93.2 fL (ref 78.0–100.0)
Platelets: 110 10*3/uL — ABNORMAL LOW (ref 150–400)
Platelets: 126 10*3/uL — ABNORMAL LOW (ref 150–400)
Platelets: 130 10*3/uL — ABNORMAL LOW (ref 150–400)
Platelets: 134 10*3/uL — ABNORMAL LOW (ref 150–400)
RBC: 3.88 MIL/uL — ABNORMAL LOW (ref 4.22–5.81)
RBC: 3.89 MIL/uL — ABNORMAL LOW (ref 4.22–5.81)
RBC: 3.93 MIL/uL — ABNORMAL LOW (ref 4.22–5.81)
RBC: 4.09 MIL/uL — ABNORMAL LOW (ref 4.22–5.81)
RDW: 13.7 % (ref 11.5–15.5)
RDW: 14 % (ref 11.5–15.5)
RDW: 14.1 % (ref 11.5–15.5)
RDW: 14.1 % (ref 11.5–15.5)
WBC: 12.4 10*3/uL — ABNORMAL HIGH (ref 4.0–10.5)
WBC: 6.3 10*3/uL (ref 4.0–10.5)
WBC: 7.7 10*3/uL (ref 4.0–10.5)
WBC: 9.6 10*3/uL (ref 4.0–10.5)

## 2011-03-27 LAB — DIFFERENTIAL
Basophils Absolute: 0 10*3/uL (ref 0.0–0.1)
Basophils Absolute: 0 10*3/uL (ref 0.0–0.1)
Basophils Absolute: 0 10*3/uL (ref 0.0–0.1)
Basophils Absolute: 0 10*3/uL (ref 0.0–0.1)
Basophils Relative: 0 % (ref 0–1)
Basophils Relative: 0 % (ref 0–1)
Basophils Relative: 0 % (ref 0–1)
Basophils Relative: 0 % (ref 0–1)
Eosinophils Absolute: 0.1 10*3/uL (ref 0.0–0.7)
Eosinophils Absolute: 0.1 10*3/uL (ref 0.0–0.7)
Eosinophils Absolute: 0.1 10*3/uL (ref 0.0–0.7)
Eosinophils Absolute: 0.2 10*3/uL (ref 0.0–0.7)
Eosinophils Relative: 1 % (ref 0–5)
Eosinophils Relative: 1 % (ref 0–5)
Eosinophils Relative: 2 % (ref 0–5)
Eosinophils Relative: 2 % (ref 0–5)
Lymphocytes Relative: 11 % — ABNORMAL LOW (ref 12–46)
Lymphocytes Relative: 14 % (ref 12–46)
Lymphocytes Relative: 18 % (ref 12–46)
Lymphocytes Relative: 8 % — ABNORMAL LOW (ref 12–46)
Lymphs Abs: 1 10*3/uL (ref 0.7–4.0)
Lymphs Abs: 1.1 10*3/uL (ref 0.7–4.0)
Lymphs Abs: 1.1 10*3/uL (ref 0.7–4.0)
Lymphs Abs: 1.1 10*3/uL (ref 0.7–4.0)
Monocytes Absolute: 0.6 10*3/uL (ref 0.1–1.0)
Monocytes Absolute: 0.7 10*3/uL (ref 0.1–1.0)
Monocytes Absolute: 0.8 10*3/uL (ref 0.1–1.0)
Monocytes Absolute: 1.1 10*3/uL — ABNORMAL HIGH (ref 0.1–1.0)
Monocytes Relative: 8 % (ref 3–12)
Monocytes Relative: 9 % (ref 3–12)
Monocytes Relative: 9 % (ref 3–12)
Monocytes Relative: 9 % (ref 3–12)
Neutro Abs: 10.2 10*3/uL — ABNORMAL HIGH (ref 1.7–7.7)
Neutro Abs: 4.4 10*3/uL (ref 1.7–7.7)
Neutro Abs: 5.8 10*3/uL (ref 1.7–7.7)
Neutro Abs: 7.7 10*3/uL (ref 1.7–7.7)
Neutrophils Relative %: 70 % (ref 43–77)
Neutrophils Relative %: 75 % (ref 43–77)
Neutrophils Relative %: 80 % — ABNORMAL HIGH (ref 43–77)
Neutrophils Relative %: 82 % — ABNORMAL HIGH (ref 43–77)

## 2011-03-27 LAB — HEPATIC FUNCTION PANEL
ALT: 720 U/L — ABNORMAL HIGH (ref 0–53)
ALT: 866 U/L — ABNORMAL HIGH (ref 0–53)
AST: 494 U/L — ABNORMAL HIGH (ref 0–37)
AST: 550 U/L — ABNORMAL HIGH (ref 0–37)
Albumin: 2.9 g/dL — ABNORMAL LOW (ref 3.5–5.2)
Albumin: 2.9 g/dL — ABNORMAL LOW (ref 3.5–5.2)
Alkaline Phosphatase: 53 U/L (ref 39–117)
Alkaline Phosphatase: 54 U/L (ref 39–117)
Bilirubin, Direct: 0.4 mg/dL — ABNORMAL HIGH (ref 0.0–0.3)
Bilirubin, Direct: 0.4 mg/dL — ABNORMAL HIGH (ref 0.0–0.3)
Indirect Bilirubin: 0.6 mg/dL (ref 0.3–0.9)
Indirect Bilirubin: 1 mg/dL — ABNORMAL HIGH (ref 0.3–0.9)
Total Bilirubin: 1 mg/dL (ref 0.3–1.2)
Total Bilirubin: 1.4 mg/dL — ABNORMAL HIGH (ref 0.3–1.2)
Total Protein: 5.3 g/dL — ABNORMAL LOW (ref 6.0–8.3)
Total Protein: 5.5 g/dL — ABNORMAL LOW (ref 6.0–8.3)

## 2011-03-27 LAB — POCT CARDIAC MARKERS
CKMB, poc: <1 ng/mL — ABNORMAL LOW (ref 1.0–8.0)
CKMB, poc: <1 ng/mL — ABNORMAL LOW (ref 1.0–8.0)
Myoglobin, poc: 42.8 ng/mL (ref 12–200)
Myoglobin, poc: 52.1 ng/mL (ref 12–200)
Troponin i, poc: <0.05 ng/mL (ref 0.00–0.09)
Troponin i, poc: <0.05 ng/mL (ref 0.00–0.09)

## 2011-03-27 LAB — HEPATITIS PANEL, ACUTE
HCV Ab: NEGATIVE
Hep A IgM: NEGATIVE
Hep B C IgM: NEGATIVE
Hepatitis B Surface Ag: NEGATIVE

## 2011-03-27 LAB — BASIC METABOLIC PANEL
BUN: 15 mg/dL (ref 6–23)
CO2: 26 mEq/L (ref 19–32)
Calcium: 8.2 mg/dL — ABNORMAL LOW (ref 8.4–10.5)
Chloride: 109 mEq/L (ref 96–112)
Creatinine, Ser: 0.63 mg/dL (ref 0.4–1.5)
GFR calc Af Amer: >60 mL/min (ref >60)
GFR calc non Af Amer: >60 mL/min (ref >60)
Glucose, Bld: 103 mg/dL — ABNORMAL HIGH (ref 70–99)
Potassium: 3.9 mEq/L (ref 3.5–5.1)
Sodium: 139 mEq/L (ref 135–145)

## 2011-03-27 LAB — EPSTEIN-BARR VIRUS VCA ANTIBODY PANEL
EBV EA IgG: 0.41 {ISR}
EBV NA IgG: 4.99 {ISR} — ABNORMAL HIGH
EBV VCA IgG: 7.61 {ISR} — ABNORMAL HIGH
EBV VCA IgM: 0.14 {ISR}

## 2011-03-27 LAB — PROTIME-INR
INR: 1.4 (ref 0.00–1.49)
Prothrombin Time: 17.7 seconds — ABNORMAL HIGH (ref 11.6–15.2)

## 2011-03-27 LAB — TROPONIN I
Troponin I: 0.01 ng/mL (ref 0.00–0.06)
Troponin I: 0.02 ng/mL (ref 0.00–0.06)
Troponin I: 0.04 ng/mL (ref 0.00–0.06)

## 2011-03-27 LAB — TYPE AND SCREEN
ABO/RH(D): A POS
Antibody Screen: POSITIVE
DAT, IgG: NEGATIVE

## 2011-03-27 LAB — HEMOGLOBIN A1C
Hgb A1c MFr Bld: 5.4 % (ref 4.6–6.1)
Mean Plasma Glucose: 108 mg/dL

## 2011-03-27 LAB — CMV ABS, IGG+IGM (CYTOMEGALOVIRUS)
CMV IgM: <8 AU/mL (ref <30.0)
Cytomegalovirus Ab-IgG: 4.4 U/mL — ABNORMAL HIGH (ref <0.4)

## 2011-03-27 LAB — BRAIN NATRIURETIC PEPTIDE: Pro B Natriuretic peptide (BNP): 548 pg/mL — ABNORMAL HIGH (ref 0.0–100.0)

## 2011-03-27 LAB — MAGNESIUM: Magnesium: 1.9 mg/dL (ref 1.5–2.5)

## 2011-03-27 LAB — TSH: TSH: 1.221 u[IU]/mL (ref 0.350–4.500)

## 2011-03-27 LAB — URINE MICROSCOPIC-ADD ON

## 2011-04-25 NOTE — Consult Note (Signed)
[name], [name]               ACCOUNT NO.:  [id number]   MEDICAL RECORD NO.:  [id number]          PATIENT TYPE:  INP   LOCATION:  2921                         FACILITY:  [facility]   PHYSICIAN:  Champ Mungo. Lovena Le, MD    DATE OF BIRTH:  05/18/1946   DATE OF CONSULTATION:  09/16/2007  DATE OF DISCHARGE:                                 CONSULTATION   INDICATIONS FOR CONSULTATION:  Recurrent VT and VT storm.   HISTORY OF PRESENT ILLNESS:  The patient is a 65 year old man with a  history of longstanding coronary disease status post myocardial  infarction status post bypass surgery in 1986.  The patient has known EF  25%.  He also has peripheral vascular disease.  He underwent ICD  implantation in 2006.  The patient was in his usual state of health  until July when he received an ICD shock at that time.  I do not have  details about that procedure, but apparently devices placed secondary to  ischemic cardiomyopathy and prior MI with EF 25%.  Of note the patient  had device reprogrammed back in July.  I do not have records on this as  well.  He was in his usual state of health until several days ago when  he felt his heart race and had a bad taste in his mouth and became dizzy  and lightheaded and received a single ICD shock.  He did not seek  medical attention initially, but after two additional what he calls  heart shocks presented to the hospital for additional evaluation.  Interrogation of his defibrillator demonstrated a Guidant device with  clear-cut VT at rates between 180 and 200 beats per minute.  These have  all terminated with ICD shock.  With a final episode the patient noted  that he was about to pass out when he was shocked.   PAST MEDICAL HISTORY:  Additional Past Medical History is notable for  morbid obesity.  He has a history of probable obstructive sleep apnea  and polycythemia.  He has a history of renal artery stenosis with  stenting in January 2005.  He has a history of  gastroesophageal reflux  disease and dyslipidemia.   FAMILY HISTORY:  He has family history that is noncontributory.   SOCIAL HISTORY:  The patient denies tobacco or ethanol abuse.  He was  previously an 80 pack-year smoker, but stopped smoking several years  ago.   REVIEW OF SYSTEMS:  Notable for occasional bright red blood per rectum  but no excessive bleeding.  He has very mild peripheral edema.  He has  been morbidly obese for many years, but Review of Systems was negative.   PHYSICAL EXAMINATION:  GENERAL:  He is a pleasant 65 year old man in no  acute distress.  VITAL SIGNS:  Blood pressure today 120/60, pulse 72 and regular,  respirations 18, temperature 98.  HEENT:  Normocephalic, atraumatic.  Pupils are equal and round.  Oropharynx moist.  Sclerae anicteric.  NECK:  Revealed no jugular venous distention.  No thyromegaly.  Trachea  was midline.  Carotids were 2+ and symmetric.  LUNGS:  Clear bilaterally to auscultation.  No wheezes, rales or rhonchi  are present.  ABDOMEN:  Obese, nontender, nondistended.  No organomegaly, bowel sounds  are present.  There is no rebound or guarding.  EXTREMITIES:  Demonstrate no cyanosis, clubbing or edema.  Pulses 2+ and  symmetric.  NEUROLOGIC:  Alert and oriented x3 with cranial nerves intact.   The EKG demonstrates sinus rhythm with right bundle branch block.   IMPRESSION:  1. Symptomatic ventricular tachycardia.  2. Ischemic cardiomyopathy status post myocardial infarction.  3. Congestive heart failure presently class II.  4. Morbid obesity.   DISCUSSION:  I have discussed treatment options with the patient in  detail.  He has been placed on intravenous amiodarone I think for this.  He can switch to oral amiodarone at a dose of 400 mg twice daily for  several days and then decreasing the amiodarone dose would be reasonable  following this.  Will plan on following the patient with you.   ADDENDUM:  I agree at this point with  continuing the amiodarone rather  than another antiarrhythmic drug like sotalol as his EF in the past has  been fairly severely diminished.      Champ Mungo. Lovena Le, MD  Electronically Signed     GWT/MEDQ  D:  09/16/2007  T:  09/17/2007  Job:  [id number]   cc:   Quay Burow, M.D.  Ardeen Jourdain, M.D.

## 2011-04-25 NOTE — Cardiovascular Report (Signed)
NAMEBLEASE, ZEMAITIS NO.:  [id number]   MEDICAL RECORD NO.:  [id number]          PATIENT TYPE:  INP   LOCATION:  2807                         FACILITY:  [facility]   PHYSICIAN:  Quay Burow, M.D.   DATE OF BIRTH:  [birth year]   DATE OF PROCEDURE:  09/16/2007  DATE OF DISCHARGE:                            CARDIAC CATHETERIZATION   HISTORY:  Mr. Strike is a 65 year old moderately overweight white male  with a history of CAD, status post bypass grafting in 1986.  His last  cath in December 20, 2003, and had dilatation of the origin of the  diagonal branch.  The LAO view otherwise was patent.  His circumflex on  the right were totally occluded, as were the vein grafts to those  vessels.  He had an ICD implanted on April 2006.  He also has PVOD  status post left renal artery stenting and bilateral iliac stenting as  well as COPD, and a small abdominal aortic aneurysm.  He was admitted  yesterday with 3 ICD discharges.  These were not preceded by chest pain.  His electrolytes are normal. and his enzymes were negative.  He presents  now for diagnostic coronary arteriography to define his anatomy and rule  out ischemic etiology.   DESCRIPTION OF PROCEDURE:  The patient was brought to the second floor  [facility] cardiac cath lab in the postabsorptive state.  He was  premedicated with p.o. Valium, IV Versed, and fentanyl.  His right groin  was prepped and shaved in the usual sterile fashion.  Then 1% Xylocaine  was used for local anesthesia.  A 6-French sheath was inserted into the  right femoral artery using standard Seldinger technique.  A 6-French  right-and-left Judkins diagnostic catheter with a 6-French pigtail  catheter were used for selective coronary cholangiography left  ventriculography and distal abdominal aortography.  Visipaque dye was  used for the entirety of the case.  Retrograde aorta and left  ventricular blood pressures were recorded.   HEMODYNAMIC  RESULTS:  1. Aortic systolic pressure [id number], diastolic pressure 68.  2. Left ventricular systolic pressure 0000000 and diastolic pressure 9.   SELECTIVE CHOLANGIOGRAPHY:  1. Left main normal.  2. LAD:  The LAD had a 40% stenosis at the origin of the second      moderate-sized diagonal branch, unchanged from the prior angiogram.      There was a 30% segmental proximal stenosis after the second      diagonal branch.  3. Left circumflex stent:  Totally occluded proximally in the AV      groove.  4. Right coronary artery:  Occluded ostially.  5. Vein graft to the PDA and circumflex:  Occluded at the aorta.   LEFT VENTRICULOGRAPHY:  RAO left ventriculogram was performed using 25  mL of Visipaque dye at 12 mL per second.  The overall LVEF was estimated  visually at approximately 40% with severe inferobasal hypokinesia, and  mild anteroapical hypokinesia.   DISTAL ABDOMINAL AORTOGRAPHY:  This was performed using 20 mL of  Visipaque dye at 20 mL per second x2.  There appeared  to be a 70%  InStent restenosis within the left renal artery stent.  There is a small  abdominal aortic aneurysm.  Both iliac stents appear widely patent.   IMPRESSION:  Mr. Fazzio has anatomy unchanged from his previous cath  almost 4 years ago.  While ischemia remains a possibility as an etiology  for his BTVF requiring 3 ICD discharges within 24 hours; other  possibilities including just scar VT, remained plausible.  Patient has  had no further discharges on IV amiodarone.  Continued medical therapy  will be recommended.  Dr. Cristopher Peru, electrophysiologist, will see  the patient for further evaluation.   Sheath was removed and pressure was placed on the groin to achieve  hemostasis.  The patient the cath lab in stable condition.      Quay Burow, M.D.  Electronically Signed     JB/MEDQ  D:  09/16/2007  T:  09/16/2007  Job:  [id number]   cc:   Cardiac Cath Lab  [facility]  Richard  A. Rollene Fare, M.D.  Ardeen Jourdain, M.D.

## 2011-04-25 NOTE — H&P (Signed)
[name], [name]               ACCOUNT NO.:  [id number]   MEDICAL RECORD NO.:  [id number]          PATIENT TYPE:  INP   LOCATION:  [id number]                         FACILITY:  [facility]   PHYSICIAN:  Jana Hakim, M.D. DATE OF BIRTH:  [birth year]   DATE OF ADMISSION:  12/13/2008  DATE OF DISCHARGE:                              HISTORY & PHYSICAL   PRIMARY CARE PHYSICIAN:  [facility], Colbert Ewing PA   CHIEF COMPLAINT:  Shortness of breath, weakness.   HISTORY OF PRESENT ILLNESS:  This is a 65 year old male with multiple  medical problems who presents to the emergency department for evaluation  secondary to worsening weakness and increased falls over the past few  days.  He also reports having more difficulty breathing with more sinus  and chest congestion and cough productive of yellowish greenish mucus.  He reports having fevers and chills off and on as well.  The patient  reports having antibiotic treatment as an outpatient with a 7-day course  of antibiotics, doxycycline.  However, he reports that his symptoms were  better for awhile and then they return.  The patient reports also having  decreased appetite but denies having any nausea, vomiting or diarrhea.  He denies having any myalgias.  He also has obstructive sleep apnea.   PAST MEDICAL HISTORY:  1. Coronary artery disease.  2. Myocardial infarctions x5.  3. History of an AICD.  4. History of COPD/emphysema.  5. History of congestive heart failure syndrome.  6. Hypertension.  7. Hyperlipidemia.  8. Gastroesophageal reflux disease.  9. Benign essential tremors.   MEDICATIONS:  1. Amiodarone 200 mg p.o. q.a.m. and 100 mg p.o. q.p.m.  2. Lipitor 40 mg p.o. daily, now back to 25 mg p.o. daily.  3. Lasix 20 mg p.o. daily.  4. Potassium chloride 10 mEq p.o. daily.  5. Ramipril 2.5 mg p.o. q.p.m. and 5 mg p.o. q.a.m.  6. Coreg 25 mg 2 tablets p.o. b.i.d.  7. Nitroglycerin sublingual 0.4 mg, one sublingual q. 5  minutes x3      p.r.n. chest pain.  8. Aspirin 81 mg p.o. daily.  9. Plavix 75 mg p.o. daily.  10.Imdur 60 mg p.o. q.a.m. and 30 mg p.o. q.p.m.Marland Kitchen  11.The patient is also on CPAP at night.  12.The patient reports previously having an outpatient antibiotic      course of doxycycline for 7 days.  13.Spiriva Handihaler 18 mcg one inhalation daily.  14.Nexium 40 mg p.o. daily.   ALLERGIES:  PENICILLIN causing hives.   SOCIAL HISTORY:  The patient is married.  He is a nonsmoker, nondrinker.   FAMILY HISTORY:  Noncontributory.   REVIEW OF SYSTEMS:  Pertinents are mentioned above.   PHYSICAL EXAMINATION:  GENERAL:  This is a morbidly obese 65 year old  male in discomfort but no acute distress now.  Initially he was  hypotensive and orthostatic.  VITAL SIGNS:  Initial vital signs were temperature 97.7, blood pressure  86/49, heart rate 55, respirations 18, O2 saturations initially 93%;  after gentle fluid resuscitation his blood pressure did improve to  117/50 and his O2 saturation  improved with oxygen to 97 and 98% on 2  liters nasal cannula oxygen.  HEENT:  Normocephalic, atraumatic.  Pupils equally round, reactive to  light.  Extraocular movements are intact Funduscopic benign.  There is  no scleral icterus.  Conjunctivae are pink.  Nares are patent.  However,  patient has edematous boggy mucosa in the nares.  Oropharynx is clear.  However, there is erythema.  No exudates.  NECK:  Supple.  Full range of  motion.  No thyromegaly, adenopathy, jugular venous distension.  CARDIOVASCULAR:  Regular rate and rhythm.  No murmurs, gallops or rubs.  LUNGS:  Decreased breath sounds and diffuse rhonchi.  There are also  bibasilar rales present.  No expiratory wheezes appreciated.  ABDOMEN:  Positive bowel sounds.  Soft, nontender, nondistended.  No  hepatosplenomegaly.  EXTREMITIES:  Without cyanosis, clubbing or edema.  NEUROLOGIC:  The patient has generalized weakness.  He is alert and   oriented x3.  Speech is clear.  There are no cranial nerve deficits.  His motor and sensory function are also intact.  Gait has not been  assessed secondary to the patient's hypotension.   LABORATORY STUDIES:  White blood cell count 12.4, hemoglobin 12.1,  hematocrit 35.9, MCV 92.3, platelets 130,000, neutrophils 82%,  lymphocytes 8%.  Protime 17.7, INR 1.4.  Point-of-care cardiac markers  with a myoglobin of 52.1, CK-MB less than 1.0, troponin less than 0.05.  Sodium 137, potassium 4.2, chloride 105, carbon dioxide 25, BUN 20,  creatinine 0.80, glucose 140, albumin 2.9, AST 540.   ASSESSMENT:  A 64 year old male being admitted with  1. Hypertension.  2. Orthostatic hypotension.  3. Congestive heart failure syndrome.  4. Chronic obstructive pulmonary disease with  5. Upper respiratory infection/acute bronchitis.  6. Type 2 diabetes mellitus.  7. Coronary artery disease history.   PLAN:  The patient will be admitted to a step-down ICU area.  Cardiac  enzymes will be performed.  Gentle IV fluids have been ordered secondary  to the patient's hypotension.  Orthostatic vital signs will be monitored  as well.  The patient's regular medications will be continued.  However,  his blood pressure medication will be held for blood pressures less than  110, or instability.  Further antibiotic therapy will also be considered  secondary to the patient's upper respiratory infection which has been  going on for approximately 3 weeks.  Nebulizer treatments have been  ordered with albuterol and Atrovent as well and the patient has been  placed on DVT and GI prophylaxis.  The patient will be placed on p.r.n.  medications for congestion if he can tolerate this.  However,  precaution will be taken secondary to the patient's cardiac irritability  and his amiodarone therapy.      Jana Hakim, M.D.  Electronically Signed     HJ/MEDQ  D:  12/13/2008  T:  12/14/2008  Job:  [id number]

## 2011-04-25 NOTE — Consult Note (Signed)
[name], [name]               ACCOUNT NO.:  [id number]   MEDICAL RECORD NO.:  [id number]          PATIENT TYPE:  INP   LOCATION:  [id number]                         FACILITY:  [facility]   PHYSICIAN:  Nelda Severe. Kellie Simmering, M.D.  DATE OF BIRTH:  [birth year]   DATE OF CONSULTATION:  12/16/2008  DATE OF DISCHARGE:                                 CONSULTATION   REFERRING PHYSICIAN:  Mohan N. Terrence Dupont, MD   CHIEF COMPLAINT:  Abdominal aortic aneurysm.   HISTORY OF PRESENT ILLNESS:  This 65 year old male patient was admitted  on January 3, with generalized weakness and falling spells which had  been present for 24-48 hours.  He had upper respiratory infection which  had been treated as an outpatient with some pulmonary congestion and had  worsening of his cough.  He was admitted and evaluated and not felt to  have any acute coronary syndrome and improved symptomatically.  Abdominal ultrasound was performed which revealed a 5.3 x 5.0 cm  abdominal aortic aneurysm.  He had a known aneurysm followed by Dr.  Rollene Fare at [facility] with ultrasound in November  2009 revealing a 4.2 x 4.0 cm aortic aneurysm.  He also has a history of  iliac stents (unsure which side) and a known right internal carotid  artery occlusion, but no history of stroke.  He denies any recent chest  pain, hemiparesis, aphasia, amaurosis fugax, diplopia, blurred vision,  or syncope.   PAST MEDICAL HISTORY:  1. Coronary artery disease.  2. Orthostasis.  3. Congestive heart failure by history.  4. COPD.  5. Diabetes mellitus type 2.  6. Severe peripheral vascular disease.  7. Known right ICA occlusion.   PAST SURGERIES:  1. Coronary artery bypass grafts in 1986.  2. Insertion of an implantable defibrillator.   ALLERGIES:  PENICILLIN.   FAMILY HISTORY:  Strongly positive for coronary artery disease in his  mother, father, and a sister and also strongly positive for diabetes.   MEDICATIONS:  Please see  health history form.   REVIEW OF SYSTEMS:  Please see health history form.   PHYSICAL EXAMINATION:  VITAL SIGNS:  Blood pressure is 115/70, heart  rate 80, and respirations 14.  GENERAL:  He is an obese, middle-aged male in no apparent distress.  Alert and oriented x3.  NECK:  Supple, 3+ carotid pulses palpable.  No bruits are audible.  CHEST:  Clear to auscultation.  CARDIOVASCULAR:  Regular rhythm.  No murmurs.  ABDOMEN:  Obese with pulsatile mass approximating 5 cm which is  nontender in the periumbilical region.  EXTREMITIES:  He has 2 to 3+ femoral, 2+ popliteal, and 2+ dorsalis  pedis and posterior tibial pulses palpable bilaterally.   Renal function during this admission is normal with a creatinine of 0.8  and BUN of 20.   IMPRESSION:  Infrarenal abdominal aortic aneurysm - no recent CT scan  performed.   RECOMMENDATIONS:  To obtain a CT angiogram of the abdomen and pelvis  with contrast to evaluate size and the patient potential candidacy for  aortic stent grafting if indicated.  We will schedule  this for tomorrow.  I do not believe the aneurysm is responsible for any of his active  symptoms.      Nelda Severe Kellie Simmering, M.D.  Electronically Signed     JDL/MEDQ  D:  12/16/2008  T:  12/17/2008  Job:  [id number]

## 2011-04-25 NOTE — Discharge Summary (Signed)
[name], [name]               ACCOUNT NO.:  [id number]   MEDICAL RECORD NO.:  [id number]          PATIENT TYPE:  INP   LOCATION:  2921                         FACILITY:  [facility]   PHYSICIAN:  Richard A. Rollene Fare, M.D.DATE OF BIRTH:  Aug 16, 1946   DATE OF ADMISSION:  09/14/2007  DATE OF DISCHARGE:  09/20/2007                               DISCHARGE SUMMARY   DISCHARGE DIAGNOSES:  1. Ventricular tachycardia storm, symptomatic.      a.     Reprogramming of defibrillator.      b.     Placed on amiodarone.  2. Ischemic cardiomyopathy, status post myocardial infarction.  3. Congestive heart failure presently, class II.  4. Coronary artery disease, status post cardiac cath with severe      disease, with no progression in the disease since previous cath.  5. Diabetes mellitus 2, new diagnosis, medications added.  6. Anxiety, added Xanax.  7. Morbid obesity.  8. Chronic obstructive pulmonary disease.  9. Acute on-chronic systolic heart failure, resolved.  [id number] systolic heart failure.  11.Peripheral vascular disease with bilateral iliac stenting in 98.  12.History of renal artery stenosis, status post left renal artery      stent in April 2005.  13.History of coronary bypass grafting in 1986 x2 vessels.   PROCEDURES:  Combined left heart cath with graft visualization by Dr.  Quay Burow, September 16, 2007, and [facility] angio with visualization of  renal artery stenosis, in-stent restenosis, and patent bilateral iliac  stents.   September 18, 2007, device changes to ICD, VT-1, 160 beats per minute, ATP  x4, ATP x4, 21 joules, 31 joules, 41 joules, stability 40 milliseconds.   VT:  195 beats per minute, ATP x2, 31 joules, 41 joules.   VF:  240 beats per minute, 31 joules, 41 joules.   DISCHARGE MEDICATIONS:  1. Aspirin 81 mg 2 tablets daily, new dose.  2. Plavix 75 mg daily.  3. Furosemide 40 mg daily.  4. Hold Niaspan.  Please see Dr. Rollene Fare.  5. Stop Klor-Con 20 mEq daily.  6. Nexium 40 mg daily.  7. Ramipril 2.5 mg tablets.  He takes 3 tablets daily.  8. Coreg.  9. Carvedilol 25 mg 1-1/2 tablets twice a day.  10.Lipitor 40 mg at bedtime.  11.Isordil mononitrate 60 mg, one at bedtime, decreased dose.  12.Stop Aldactone.  13.Spiriva  18-mcg inhalation daily.  14.Amiodarone 200 mg 2 tablets twice a day for 10 days, then 2 tablets      daily for two weeks, and then 200 mg daily.  15.Xanax 0.25 mg one every 8 hours as needed for anxiety.  16.Glucotrol XL 5 mg daily, new medication.  17.Glucophage 500 mg, one twice a day.   DISCHARGE INSTRUCTIONS:  1. Low-sodium heart-healthy diabetic diet.  2. Increase activity slowly.  3. Follow up with Dr. Rollene Fare October 15, 2007 at 12:00 p.m.  4. Keep previous appointments with Dr. Tomasa Hosteller in sleep study.  Our office has called and arranged those appointments.  He is to see Dr.  Tomasa Hosteller for his diabetes.  1. Accu-Checks before meals and at bedtime and  keep a log for Dr.      Tomasa Hosteller.  2. Ask Dr. Rollene Fare about Niaspan, Klor-Con and Aldactone, when you      see him in the office, and also asked him about Xanax if you need      increased prescription.  3. Stop Klor-Con, Aldactone and hold the Niaspan.   HISTORY OF PRESENT ILLNESS:  A 65 year old gentleman well-known by Dr.  Rollene Fare, presented with defibrillator shock x2.  He has a history of  coronary disease with bypass grafting in 86, several previous MIs.  Last  cardiac cath was in January 2005 with 90% diag 2, dilated to less than  40% with cutting balloon angioplasty.  EF on the last cath was 25%.  He  had a guided ICD implantation in April of 2006.   When he presented to the ER at [facility] on September 14, 2007, it was after two  ICD shocks the evening previous, he noted metallic taste, nausea, and  some rapid palpitations around 10:30 p.m. while lying in bed, and  received one shock, felt better, slept well.  On today, intermittent  episodes of metallic  taste and nausea.  This evening, he had a sudden  onset of palpitations followed by ICD shock.  No further palpitations  but intermittent sensation of taste and nausea.  Continued no chest  pain.  He has chronic shortness of breath.  No change baseline on  admission, mild increased lower extremity edema, no abdominal  distention, chronic three-pillow sleeper and no change in that.  No  syncope, no recurrent symptoms suggestive of previous angina.   ALLERGIES:  PENICILLIN CAUSES HIVES.   PAST MEDICAL HISTORY:  As above.  He also has hyperlipidemia,  gastroesophageal reflux disease, polypectomy in 2006, COPD,  polycythemia, suspected obstructive sleep apnea for sleep study, history  of status post left renal artery stenting for the renal artery stenosis.  He has bilateral iliac stenting, small AAA, obesity, and his last ICD  shock of been in July 2008.   Family history, social history, review of systems, see H&P.   PHYSICAL EXAMINATION ON DISCHARGE:  VITAL SIGNS:  Blood pressure 112/51,  pulse 64, temperature 97.6, oxygen saturation room air 94%.  He is in  sinus rhythm, rate of 72.  CHEST:  Decreased breath sounds bilaterally.  HEART:  Regular rate and rhythm.   LABORATORY DATA:  Hemoglobin on admission 15.4, hematocrit 45, WBC 9.7,  platelets 231, neutrophils 74, lymph 18, monos 6, eos 3, basos zero.  These remained stable.  PT 12.2, INR 0.9, PTT 25, again stable.  Chemistry:  Sodium 133, potassium 5, chloride 102, CO2 27, glucose 239,  BUN 10 and creatinine 0.69.  It peaked at 0.78.  GFR was greater than  60, total protein 7, albumin 3.7, AST 25, ALT 27, ALP 163, total bili  0.8, magnesium 1.9.   Please note:  His glucose remained elevated, and we did a  glycohemoglobin which came back 9.9.   Cardiac enzymes were all negative 73, 65, 62.  MBs 1.9, 1.8, 1.4.  Troponin Is were negative, 0.02.  BNP was low at 47.   Total cholesterol 188, triglycerides 167, HDL 43, LDL 112, TSH  2.573.   Chest x-ray:  Unchanged ICD, in no acute cardiopulmonary process on  September 15, 2007.  EKG:  Sinus rhythm, nonspecific ST-T wave changes.  Cardiac catheterization:  EF was 40 to 45%, which was improved.  Occlusion of the saphenous vein graft to the RCA, 100%  RCA negative, and  the limited to the LAD was occluded, circumflex 100% occluded which was  old.  There were no changes on his cardiac cath.  On his PV angio, he  did have a 70% in-stent renal stenosis.   HOSPITAL COURSE:  The patient was admitted September 15, 2007, due to  recurrent defibrillator shocks.  Medications were adjusted.  His ICD was  programmed, and the patient was given IV amiodarone 150 mg bolus, and  then a drip at 1 mg per minute.  It was interrogated the next morning.   The patient had a lot anxiety for this.  He was given medication.   The patient underwent cardiac cath on October 6, with results as  previously stated.  Also, we were realizing his glucose was elevated.  He was placed on sliding scale, and then we added with a glycohemoglobin  a 9.9 Glucophage and glyburide 48 hours, actually more than 48 hours  after his heart cath.   EP was consulted, as his cardiac cath had shown no change, causing his  recurrent ventricular tachycardia.  Amiodarone load was recommended.  Also, device changes were done as previously stated.   The patient continued to improve.  We are waiting for his amiodarone to  come on board, to give him time not to have any further ventricular  tachycardia.  He continued to improve, and by September 20, 2007 he was  stable and ready for discharge home.  He had nutrition consult,  discussing diabetes guidelines, discussed outpatient education, and he  will go back to Dr. Tomasa Hosteller to make final decisions on his diabetes  management.      Otilio Carpen. [place], N.P.      Richard A. Rollene Fare, M.D.  Electronically Signed    LRI/MEDQ  D:  09/24/2007  T:  09/25/2007  Job:   [id number]   cc:   Delfino Lovett A. Rollene Fare, M.D.  Champ Mungo. Lovena Le, MD  Ardeen Jourdain, M.D.

## 2011-04-25 NOTE — Discharge Summary (Signed)
[name], [name]               ACCOUNT NO.:  [id number]   MEDICAL RECORD NO.:  [id number]          PATIENT TYPE:  INP   LOCATION:  [id number]                         FACILITY:  [facility]   PHYSICIAN:  Helen Hashimoto, MD    DATE OF BIRTH:  [birth year]   DATE OF ADMISSION:  12/13/2008  DATE OF DISCHARGE:  12/18/2008                               DISCHARGE SUMMARY   PRIMARY PHYSICIAN:  Kemper Durie, PA   DISCHARGE DIAGNOSES:  1. Abdominal aortic aneurysm around 4.8 cm in its largest diameter.  2. Transaminitis secondary to viral disease versus plus or minus      statins.  3. Cardiomyopathy.  4. Bradycardia.  5. History of coronary artery disease.  6. Myocardial infarction x5.  7. History of automatic implantable cardioverter-defibrillator.  8. History of chronic obstructive pulmonary disease.  9. History of congestive heart failure.  10.Hypertension.  11.Hyperlipidemia.  12.Gastroesophageal reflux disease.  13.Benign essential tremors.   DISCHARGE MEDICATIONS:  1. Aspirin 81 mg once a day.  2. Coreg 25 mg p.o. twice daily.  3. Plavix 75 mg once a day.  4. Lasix 40 mg once a day.  5. Imdur 60 mg in the morning and 30 mg at p.m.  6. Nitroglycerin 0.4 mg sublingual as needed.  7. Spironolactone 25 mg once a day.  8. Ramipril 2.5 mg 2 tablets in the morning and 1 tablet at p.m.  9. Metformin 500 mg twice daily.  10.Spiriva 18 mcg once a day.  11.Ambien 10 mg at bedtime as needed.  12.Fish oil once a day.   CONSULTATIONS:  1. Cardiology consult with Dr. Terrence Dupont.  2. Vascular Surgery consult with Dr. Tinnie Gens.   PROCEDURES:  None.   RADIOLOGY STUDIES:  1. Chest x-ray on December 13, 2008 showed no acute problem.  2. Abdominal ultrasound on December 15, 2008 showed no acute findings      and showed fusiform abdominal aortic aneurysm around 5.3 cm.  3. CT angio of the abdomen showed infrarenal abdominal aortic aneurysm      which is 4.8 cm in its largest diameter.   COURSE OF  HOSPITALIZATION:  1. Transaminitis.  This patient has elevated liver function test at      the time of admission.  On presentation, his AST was 540, ALT 708,      and his alkaline phosphatase is 55.  The patient was admitted to      the hospital for further evaluation.  A hepatitis profile was done      and it was negative for hepatitis A, B and C.  Epstein-Barr virus      was done where IgG was high and IgM was negative.  CMV was      negative.  Abdominal ultrasound was done and showed no problem with      the liver.  His current transaminitis was felt to be most likely      secondary to statins plus or minus at chronic Epstein-Barr virus      hepatitis.  The patient continued to improve quick in the hospital.  At the time of discharge, his AST improved to 46 and his ALT      improved to 215.  Currently, the patient is totally asymptomatic.      I recommended him to stop his statin and to follow with his primary      doctor to repeat his liver function test in 1-2 weeks.  2. Abdominal aortic aneurysm.  This patient is known for AAA that has      been monitored by his cardiologist.  As mentioned above, an      ultrasound was done to evaluate his liver and it showed aneurysm of      possibly around 5.3.  Vascular Surgery was consulted where CT angio      of the abdomen and pelvis was done and showed the aortic aneurysm      around 4.8 cm in its largest diameter.  Recommendation is to      continue to do that as he has been doing as an outpatient with his      cardiologist with no need for acute intervention at the present      time.  3. Bradycardia.  The patient has been followed by Cardiology during      the hospital.  Medications were adjusted where his Coreg was      decreased down to 25 mg twice daily and his amiodarone was      discontinued.  The patient will continue to follow with his      cardiologist as an outpatient.   Otherwise, other medical conditions remained stable in  the hospital.  The patient to be discharged home on all preadmission medications except  amiodarone that will be discontinued and Coreg that has been decreased  down.  He will follow with his primary doctor and with his cardiologist.   TOTAL ASSESSMENT TIME:  40 minutes.      Helen Hashimoto, MD  Electronically Signed     NAE/MEDQ  D:  12/18/2008  T:  12/19/2008  Job:  [id number]

## 2011-04-28 NOTE — Op Note (Signed)
NAME:  [name], [name]                         ACCOUNT NO.:  [id number]   MEDICAL RECORD NO.:  [id number]                   PATIENT TYPE:  OIB   LOCATION:  4727                                 FACILITY:  [facility]   PHYSICIAN:  Richard A. Rollene Fare, M.D.          DATE OF BIRTH:  1946/07/17   DATE OF PROCEDURE:  03/24/2004  DATE OF DISCHARGE:                                 OPERATIVE REPORT   PROCEDURE:  Retrograde abdominal aortic catheterization, abdominal aortic  angiogram with midstream PA projection, bilateral iliac angiography PA  projection, abdominal angiogram lateral projection, bilateral lower  extremity runoff using PSA and steptable imaging, selective left renal  artery angiogram and transstenotic gradient measurement, left renal artery  PCA and stent for high grade stenosis with hypertension on multiple medical  therapies with comorbid coronary disease, hyperlipidemia, remote bypass and  metabolic syndrome.   DESCRIPTION OF PROCEDURE:  The patient was brought to the sixth floor PB lab  in a postabsorptive state after 5 mg of Valium p.o. premedication.  He was  hydrated preoperatively.  Preoperative laboratory showed an H&H of 16/47,  BUN and creatinine 11 and 0.9, normal TSH, coags, urinalysis.  The right  groin was prepped and draped in the usual sterile fashion.  1% Xylocaine was  used for local anesthesia.  The patient was given intermittent Nubain and  Versed 2 and 4 mg respectively IV in divided doses for sedation.  At the  beginning of the interventional procedure, he is given 3500 units of heparin  after sheath placement.   The CRFA was entered with a single anterior puncture using 18 thin wall  needle and a 6 French short Daig sidearm sheath was inserted without  difficulty.  A J-tip 0.035 inch guide wire was used to negotiate the iliac  system and previously placed stents.  A 5 French pigtail catheter was placed  above the level of the renal arteries and abdominal  angiogram was done with  PSA and PA projection at 20 mL, 20 mL per second.  A second injection was  done at 25 mL, 20 mL per second in the lateral projection.  Iliac  angiography was done at 20 mL, 20 mL per second injection above the iliac  bifurcation.  Bilateral lower extremity runoff was done at 88 mL, 8 mL per  second with visualization in the feet bilaterally.  Selective left renal  angiogram was done by hand injection with a 6 [place] JR4 guiding catheter.  Transstenotic gradient measurement was greater than 60 mmHg with the end  hole catheter.  The patient tolerated the diagnostic procedure well.   Arterial pressures were monitored throughout the procedure.                                               Richard  A. Rollene Fare, M.D.    RAW/MEDQ  D:  03/24/2004  T:  03/25/2004  Job:  [id number]

## 2011-04-28 NOTE — Cardiovascular Report (Signed)
NAME:  [name], [name]                         ACCOUNT NO.:  [id number]   MEDICAL RECORD NO.:  [id number]                   PATIENT TYPE:  OIB   LOCATION:  4727                                 FACILITY:  [facility]   PHYSICIAN:  Richard A. Rollene Fare, M.D.          DATE OF BIRTH:  [birth year]   DATE OF PROCEDURE:  03/24/2004  DATE OF DISCHARGE:                              CARDIAC CATHETERIZATION   ADDENDUM:  It should be noted that this patient on review of his abdominal  angiogram the IMA is not visualized and it may because of poor filling in  infrarenal aneurysm.  On the iliac injection it appeared that the IMA  probably is intact, but there may be some external iliac collaterals to the  left colic area.   Also, at the patient's most recent coronary intervention he had high grade  in-stent stenosis of his RCIA stent and in order to maintain access and  because of recurrent claudication he underwent right common iliac PTA for in-  stent restenosis on January 08, 2004 with an 8 mm x 2 mm Cordis PowerFlex  balloon (see report).   CATHETERIZATION DIAGNOSES:  1. Arteriosclerotic heart disease, remote myocardial infarction in 1981 and     1986, documented.  2. Coronary artery bypass graft x2 in 1986 - saphenous vein graft occlusion     x2 at most recent catheterization January 08, 2004.  3. Remote radius stent saphenous vein graft to circumflex (Dr. Leda Gauze in 1999;     stent to saphenous vein graft to RCA Dr. Sherrin Daisy Apr 17, 1998; redilatation     saphenous vein graft to RCA in 1999; ultimate graft occlusion x2 at cath     January 08, 2004).  4. Remote right superficial femoral artery segmental occlusion recannulized     on this study, widely patent right superficial femoral artery without     significant stenosis.  5. Remote left superficial femoral artery percutaneous transluminal     angioplasty March 13, 1997, no restenosis.  6. Bilateral iliac stenting, March 13, 1997 with right common iliac    dilatation January 2005.  No restenosis on this study.  7. Successful culprit lesion diagonal two ostial cutting balloon atherectomy     January 08, 2004.  No recurrent angina.  8. Possible AVMs, anemia, heme-positive stools requiring transfusion     normalization hemoglobin.  9. Exogenous obesity.  10.      Chronic obstructive pulmonary disease, chronic bronchitis,     continued cigarette abuse.  11.      Infrarenal abdominal aortic aneurysm fusiform beginning at the left     renal artery extending near iliac bifurcation.  12.      Metabolic syndrome.  13.      Renal vascular hypertension with successful left renal artery     percutaneous transluminal angioplasty and stenting March 24, 2004.  Richard A. Rollene Fare, M.D.    RAW/MEDQ  D:  03/24/2004  T:  03/25/2004  Job:  [id number]   cc:   Izola Price. Benay Spice, M.D.  [place] [place] [address]  Fax: [phone]   Pricilla Riffle. Fuller Plan, M.D. [facility]   Ardeen Jourdain, M.D.  [address]  [place] [address]  Fax: [phone]

## 2011-04-28 NOTE — Consult Note (Signed)
[name], [name]                         ACCOUNT NO.:  [id number]   MEDICAL RECORD NO.:  [id number]                   PATIENT TYPE:  INP   LOCATION:  3303                                 FACILITY:  [facility]   PHYSICIAN:  [name], M.D. [facility]           DATE OF BIRTH:  21-Jan-1946   DATE OF CONSULTATION:  12/16/2003  DATE OF DISCHARGE:                                   CONSULTATION   REQUESTING PHYSICIAN:  [name], M.D.   REASON FOR CONSULTATION:  Anemia, heme-positive stool.   HISTORY:  This is a pleasant 65 year old white man with known severe  coronary artery disease with ischemic cardiomyopathy, ejection fraction 10-  15%.  He was found to have iron deficiency anemia in October of 2003.  At  that time he underwent an upper GI endoscopy and colonoscopy by [name].  The colonoscopy showed internal hemorrhoids.  There was a fair  adequate prep.  He had a normal upper GI endoscopy.  Biopsies were taken to  rule out celiac disease.  I do not have those available but the patient does  not recall being told that there were any abnormalities and he is certainly  not on a celiac diet at this time.  He has remained on Plavix for his  coronary disease.  He was on iron since that time and recently he had seen  [name] and had his iron increased to four each day for a low  hemoglobin.  We do not know what that was.  He has had chronically dark  stools.  His hemoglobin on admission was 7.4 with MCV 67.5.  He has a  ferritin of 3.  His folic acid was 8.7 and his B12 264.  The iron and TIBC  were 14 and 421, respectively.  Saturation 3%.  Coags normal.  BUN was  normal.  He feels much better since being transfused.  His hemoglobin is up  to 9.1 after 2 units of packed red cells.  He was having dyspnea and some  vague chest discomfort.  He was ruled out for MI.  His stool was heme-  positive on admission.  He has had six or seven years of intermittent  bilateral  lower quadrant and flank cramping that is unchanged and comes and  goes.  There are no other significant GI symptoms.  He does not take aspirin  or nonsteroidals on a regular basis other than recent introduction of 81 mg  aspirin.  He does not really recall those medicines but he brings a list.   ALLERGIES:  PENICILLIN.   MEDICATIONS:  1. Niaspan 500 mg two at bedtime.  2. Lasix 40 mg daily.  3. Potassium chloride daily.  4. Iron sulfate four each day.  5. Altace 2.5 mg daily.  6. Coreg 12.5 mg daily.  7. Plavix 75 mg daily.  8. Lipitor 10 mg daily.  9. Isosorbide 60  mg daily.  10.      Nitroglycerin as needed.  11.      Spiriva 18 mcg.  12.      It looks like he has had aspirin started here at the hospital.   PAST MEDICAL HISTORY:  1. Coronary artery disease.  Three myocardial infarctions.  Bypass grafting     x2.  Last catheterization October 03, 2002 with persistent coronary     artery disease.  2. Peripheral vascular disease with bilateral iliac stents.  He has aortic     stenosis as well.  3. COPD.  4. Morbid obesity.  5. Hypertension.  6. Dyslipidemia.  7. History of small abdominal aortic aneurysm.  8. Otherwise as above.   FAMILY HISTORY:  Noncontributory to the current problem.   SOCIAL HISTORY:  He lives with his wife in [place].  No alcohol.  He does  smoke.  He has restarted.  His wife is expecting a fourth child at this  time, it seems.   REVIEW OF SYSTEMS:  Iron colored stools, things mentioned above.  Some lower  extremity edema.  When he does not feel well he has difficulty remembering  things, he says.  All other systems appear negative.   PHYSICAL EXAMINATION:  GENERAL:  Obese, pleasant white man in no acute  distress.  VITAL SIGNS:  Blood pressure 114/86, respirations 20, pulse 84, temperature  98.2.  HEENT:  Eyes:  Anicteric.  Mouth:  Free of lesions.  NECK:  Supple.  I see no jugular venous distention.  CHEST:  There are some coarse breath  sounds.  No wheezes.  Air movement is  fair.  HEART:  Distant S1, S2.  No murmurs, rubs, or gallops.  ABDOMEN:  Soft, obese, nontender.  No organomegaly or mass.  RECTAL:  Not performed.  He has been hemoccult-positive already.  EXTREMITIES:  2+ lower extremity edema.  NEUROLOGIC:  He is alert and oriented x3.  LYMPH NODES:  I detect no neck or supraclavicular nodes.   LABORATORY DATA:  Coags are normal.  His CK-MBs and troponins were negative.  His BNP is 147 but it is only minimally elevated.  This is not consistent  with congestive heart failure.   EKG shows signs of left atrial enlargement, Q-waves in inferior leads, some  nonspecific ST-T wave changes in the lateral leads.   ASSESSMENT:  Recurrent/persistent iron deficiency anemia with hemoccult-  positive stools in a man on Plavix.  He has significant coronary artery  disease and congestive heart failure and he became symptomatic with his  anemia.  He seems to be much improved after his transfusion.  He has had a  negative upper endoscopy and colonoscopy in the past 14 months.  The  colonoscopy had a fair, adequate prep.  I think it is possible he has  angiodysplasia or other lesions somewhere.  I do not think his bilateral  lower quadrant and flank pain that he has had for six years or more is  related to any particular process, though it is possible a small bowel tumor  could cause something like this.   RECOMMENDATIONS AND Small:  1. Will arrange for an outpatient capsule endoscopy.  2. I think he should hold his Plavix at this point as long as cardiology     agrees with that.  Would hold aspirin as well.  3. He may need an upper endoscopy and colonoscopy repeated.  We will arrange     that as an outpatient if  appropriate.  4. Dr. Fuller Small is his regular gastroenterologist.  We will coordinate follow-     up with him or if he is unable I will help.  I appreciate the opportunity     to care for this patient.                                               [name], M.D. [facility]    CEG/MEDQ  D:  12/16/2003  T:  12/16/2003  Job:  [id number]   cc:   [name], M.D.  [address]  [address] [place]  [place] [address]  Fax: [phone]

## 2011-04-28 NOTE — Discharge Summary (Signed)
[name], [name]                         ACCOUNT NO.:  [id number]   MEDICAL RECORD NO.:  [id number]                   PATIENT TYPE:  INP   LOCATION:  3736                                 FACILITY:  [facility]   PHYSICIAN:  Erlene Quan, P.A.                DATE OF BIRTH:  1946/08/22   DATE OF ADMISSION:  10/01/2002  DATE OF DISCHARGE:  10/05/2002                                 DISCHARGE SUMMARY   DISCHARGE DIAGNOSES:  1. Coronary disease, coronary artery bypass grafting in 1986 with some     progression of disease noted at catheterization this admission, plan for     medical therapy.  2. 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  3. Anemia, transfused this admission.  4. Chronic obstructive pulmonary disease with a history of smoking.   HOSPITAL COURSE:  The patient is a 65 year old male followed by Dr.  Angelina Sheriff, who had bypass surgery in 1986 by Dr. Redmond Pulling. He subsequently had  a stent to his SVG to the RCA in 1999.  He was admitted October 01, 2002,  with increasing chest pain consistent with angina.  Cardiolite study done  January 2003 showed scar but no ischemia.  The patient was admitted to  telemetry, ruled out for MI.  He was noted to be significantly anemic with a  hemoglobin of 6.3 and his catheterization was held off.  Stools were guaiac-  negative.  He was seen in consult by Dr. Lucio Edward and was transfused.  The plan is for outpatient GI evaluation including a colonoscopy.  He was  transferred 2 more units on the 24th.  Catheterization was done October 03, 2002, by Dr. Tami Ribas which revealed occluded SVG to OM-1 and OM-2 and  occluded SVG to the PDA.  The native RCA was totally occluded as was the  native circumflex.  The LAD had a 60% narrowing and the second diagonal had  a 70% narrowing.  There were left to right and left to left collaterals.  The plan is for continued medical therapy.  His EF was 10-15% with global  hypokinesis.   He did have 30% left iliac at a previous stent site and 50%  right iliac at a previous stent site. Dr. Tami Ribas suggested EECP therapy  later.  We feel he can be discharged October 05, 2002.  Hemoglobin at  discharge was 10.4.   DISCHARGE MEDICATIONS:  1. Iron 325 mg three times a day after meals.  2. Coreg 12.5 b.i.d.  3. Plavix 75 mg a day, Plavix should be held until after his colonoscopy.  4. He is also on aspirin 81 mg a day.  5. Lipitor 10 mg a day.  6. Altace 2.5 mg twice a day.  7. Lasix 40 mg a day.  8. Potassium 20 mEq a day.  9. Imdur 60 mg a day.  10.  Nicotine patch 14 mg a day.  11.      Xanax p.r.n.   DISCHARGE LABORATORY DATA:  Hemoglobin is 10.4, hematocrit 3.5, platelets  156.  Stool was negative for blood.  Reticulocyte count was 2.5, INR 1.0.  Sodium 136, potassium 3.9, BUN 14, creatinine 0.8.  Liver functions are  normal.  CK-MB and troponin are negative x2.  Lipid profile showed a  cholesterol of 104, triglycerides 76, HDL 30, LDL 59, TSH 1.97.  Iron level  was less than 10.  TIBC not calculated.  Ferritin was 3.  Blood type is A,  Rh-positive, antibody positive.  ECG is sinus rhythm with nonspecific ST  changes and PVCs.  He does have some inferolateral ST depression.   DISPOSITION:  The patient is discharged in stable condition and will follow  up with Dr. Melvern Banker and Dr. Fuller Plan.  He is to stop his Plavix until after his  colonoscopy.  He will need a BMP in followup as the Altace is new.  We did  instruct him to stop his Lopressor and start metoprolol.                                                                        Erlene Quan, P.AMeryl Dare  D:  10/06/2002  T:  10/07/2002  Job:  [id number]   cc:   Bryson Dames, M.D.  [address], [address] [place] [address]  Fax: [place] T. Dagoberto Ligas., M.D. [facility]

## 2011-04-28 NOTE — H&P (Signed)
[name], [name]                         ACCOUNT NO.:  [id number]   MEDICAL RECORD NO.:  [id number]                   PATIENT TYPE:  EMS   LOCATION:  MAJO                                 FACILITY:  [facility]   PHYSICIAN:  Richard A. Rollene Fare, M.D.          DATE OF BIRTH:  02-Jan-1946   DATE OF ADMISSION:  12/15/2003  DATE OF DISCHARGE:                                HISTORY & PHYSICAL   HISTORY OF PRESENT ILLNESS:  [name] is a 65 year old white married  male patient who has known coronary artery disease, ASPVD, who has had  progressive increased frequency of angina, tiredness, and fatigue.  He has  been using sublingual nitroglycerin.  His wife states he cannot keep a  supply because he is using so much.  He was last seen in the office October 20, 2002.  He has had worsening fatigue x 6 months, worse over the last  three months with angina.  He has seen Ardeen Jourdain, M.D., apparently  in the office who, I believe, put him on some iron because of noticed low  blood count.   PAST MEDICAL HISTORY:  1. His prior medical history includes coronary artery disease.  He has had a     history of three MI.  He has had a history of CABG x 2, November 04, 1985     by Denton Meek. Redmond Pulling, M.D.  He had a SVG to his OM-1 and OM-2     sequentially and a SVG to his PDA.  His last catheterization was October 03, 2002.  His SVG to his OM-1 and OM-2 was totalled.  His SVG to his PDA     was totalled.  His native circumflex was totalled and his native RCA was     totalled.  His native LAD had a 50% mid stenosis.  He had left to left     collateral to his diagonal 2, a 70% ostial stenosis.  He had patent iliac     stents.  He has ischemic cardiomyopathy.  His EF at that time was 10-15%     at catheterization.  A 2-D echocardiogram done about a month later showed     an EF 45-55%, questionable accuracy.  2. He has ASPVD.  March 13, 1997, he had a left SFA, PTA.  He had bilateral  iliac stents.  He has positive aorta stenosis.  3. He has COPD.  4. Hypertension.  5. Dyslipidemia.  6. History of small AAA.  7. He had an EGD and colonoscopy November 2003, which was negative.  8. He did have an admission October 2003 at which time he was anemic and     received 3 units packed red blood cells.   MEDICATIONS:  1. Niaspan 500 mg two at bedtime.  2. Furosemide 40 mg every day.  3. K-Dur 20 mEq every day.  4. Ferrous sulfate 325  mg three every day.  5. Altace 2.5 mg every day.  6. Coreg 12.5 mg two every a.m., one every p.m.  7. Plavix 75 mg every day.  8. Lipitor 10 mg every day.  9. Imdur 60 mg every day.  10.      NitroTab 0.4 mg p.r.n.  11.      Spiriva MDI p.r.n.   ALLERGIES:  PENICILLIN.   FAMILY HISTORY:  Noncontributory.   SOCIAL HISTORY:  Quit tobacco for seven months.  He has now restarted.  He  has two children, three children.  They are expecting a fourth.   REVIEW OF SYSTEMS:  Positive black stools but he states he is on iron.  No  presyncope, no syncope.  Positive shortness of breath, positive dyspnea on  exertion, positive chest pain, positive GERD constantly, positive nausea.  No palpitations.  Positive lower extremity edema.  No unilateral weakness.  Positive nausea and near vomiting.   PHYSICAL EXAMINATION:  VITAL SIGNS:  Blood pressure is 153/71, 151/73.  Heart rate is 100, respirations 20, temperature 98.2.  GENERAL:  Positive for shortness of breath.  He is pale and obese.  RESPIRATORY:  He has wheezes and crackles.  CARDIOVASCULAR:  Heart sounds are regular.  NECK:  Thick.  GI:  Bowel sounds present x 4.  He is obese, no organomegaly can be  detected.  MUSCULOSKELETAL:  Moves all extremities x 4.  NEUROLOGIC:  Alert and oriented x 3.  SKIN:  He has resolving rash on his right shin.  Lower extremities with 1+  edema.   LABORATORY DATA:  Guaiac positive stool.  There was barely any stool on the  card.  There was only one speck of  brown stool.   ASSESSMENT:  1. Congestive heart failure.  2. Acute anemia, guaiac positive stools.  3. Unstable angina pectoris.  4. Chronic obstructive pulmonary disease.  5. Hypertension.  6. ICM.  7. Known atherosclerotic cardiovascular disease with history of three     myocardial infarctions and a coronary artery bypass graft.  8. Dyslipidemia.   PLAN:  Admit.  Give packed red blood cells.  Rule out MI.  He needs to be  diuresed.  He will need a catheterization when stable.  EGD and colonoscopy  November 2003 was negative.  [facility] was called to see him.  They will  see him on December 15, 2002.  He is not having any melena at this time.  I  told him if his CK-MB and troponins were negative, he could have the EGD and  a colonoscopy.  He will probably needs these prior to undergoing cardiac  catheterization.  We will recheck labs in the morning.      Cyndia Bent, N.P.                        Richard A. Rollene Fare, M.D.    BB/MEDQ  D:  12/15/2003  T:  12/15/2003  Job:  [id number]   cc:   Pricilla Riffle. Fuller Plan, M.D. [facility]   Ardeen Jourdain, M.D.  [address]  [place] [address]  Fax: [place]. Rollene Fare, M.D.  [phone] [place] [address]  Fax: [phone]

## 2011-04-28 NOTE — Discharge Summary (Signed)
NAMEVANSON, SOLDAN               ACCOUNT NO.:  [id number]   MEDICAL RECORD NO.:  [id number]          PATIENT TYPE:  INP   LOCATION:  4708                         FACILITY:  [facility]   PHYSICIAN:  Cyndia Bent, N.P.     DATE OF BIRTH:  01-17-1946   DATE OF ADMISSION:  03/21/2005  DATE OF DISCHARGE:                                 DISCHARGE SUMMARY   Mr. Rahat Voorhees is a patient of Dr. Terance Ice who came into the  hospital on March 21, 2005 for implantation of an AICD.  This was performed.  He had a ICD Guidant Vitalogy HE model #T180 DDDR, high output device,  serial number [id number] with passive fixation and Guidant ICD dual coil lead.  On March 22, 2005, he was in stable condition, considered ready for  discharge.  His blood pressure was 102/64.  His pulse was 86, his  respirations were 20, his temperature was 97.5, his O2 saturations were 92%.   His chest x-ray showed no pneumothorax.  Please see Dr. Lowella Fairy complete  operative report for further details.  He had no labs drawn here at [facility].   DISCHARGE MEDICATIONS:  1.  Aspirin 81 mg every day.  2.  Plavix 75 mg every day.  3.  Lasix 40 mg every day.  4.  Niaspan 500 mg two at bedtime.  5.  Potassium supplement 20 mEq every day.  6.  Altace 2.5 mg every day.  7.  Coreg 25 mg two times per day.  8.  Lipitor 40 mg daily.  9.  Isosorbide mononitrate 90 mg every day.  10. Spiriva 18 mcg every day.  11. Ferrous sulfate 325 mg four times per day.  12. Aldactone 12.5 mg every day.  13. Nexium 40 mg every day.   His medications were not changed.  He will return to see Kerin Ransom on  April 21 at 2 p.m. and he will follow up with Dr. Rollene Fare on May 26 at  3:45.  If he has any problems with the incisional area he should call.  He  should keep his wound clean and dry for 5 days.  If he has any problems, he  will call.  He may wash it with soap and water after 5 days.  He should do  no driving for a week, no  lifting for a week.  He should increase his  activity slowly, no pushing, pulling, lifting with his arm on the affected  side or reaching.   DISCHARGE DIAGNOSES:  1.  Status post internal cardioverter defibrillator, Guidant Vitality, HE      model #T180 implanted.  2.  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  3.  History of myocardial infarction in 1980, 1981, and 1986.  4.  Multivessel coronary artery bypass grafting in 1986.  5.  Multiple percutaneous coronary interventions after bypass grafting.  6.  Congestive heart failure with low output congestive symptoms, class 3,  on optimal medical therapy.  7.  Peripheral artery disease with the right superficial femoral artery,      posterior tibial artery, 1998, bilateral common iliac stents in 1998,      and right common iliac, posterior tibial artery for restenosis January,      2005.  8.  Morbid obesity.  9.  Chronic obstructive pulmonary disease.  Chronic cigarette abuse, quit      only approximately 1 month ago.  10. History of anemia with iliac avascular malformations on high dose iron      supplementation.  11. Adult onset diabetes mellitus.  12. Hyperlipidemia.  13. History of left renal artery stent and renal vascular hypertension      April 2005.  14. Asymptomatic abdominal aortic aneurysm.  15. Sleep apnea.      BB/MEDQ  D:  03/22/2005  T:  03/22/2005  Job:  [id number]

## 2011-04-28 NOTE — Op Note (Signed)
[name], [name]               ACCOUNT NO.:  [id number]   MEDICAL RECORD NO.:  [id number]          PATIENT TYPE:  INP   LOCATION:  2899                         FACILITY:  [facility]   PHYSICIAN:  Richard A. Rollene Fare, M.D.DATE OF BIRTH:  1946-05-13   DATE OF PROCEDURE:  03/21/2005  DATE OF DISCHARGE:                                 OPERATIVE REPORT   PROCEDURE:  1. Implantation of non-thoracotomy ICD Guidant Vitality-HE, model number      T180, DDDR high output device, SN [id number], with passive fixation Guidant      ICD dual coil lead, model number 0148, SN [id number].  2. Left upper extremity venogram verification successful DC shock with R      on T induced ventricular fibrillation through device.     PHYSICIAN:  Richard A. Rollene Fare, M.D.   PROCTOR:  Mark E. Mitzi Davenport, M.D.   COMPLICATIONS:  None.   ESTIMATED BLOOD LOSS:  Approximately 30 mL.   ANESTHESIA:  5 mg Valium p.o. premedication, intermittent IV Versed 75 mg  total, intermittent IV Fentanyl 75 mcg total, intermittent IV Demerol 62.5  mg total, 1% local Xylocaine.   ANTI-HYPERTENSIVE THERAPY:  10 mg Labetalol IV.   PREOPERATIVE ANTIBIOTIC PROPHYLAXIS:  Vancomycin 1 gram IV.   PREOPERATIVE DIAGNOSIS:  1. Ischemic dilated cardiomyopathy with documented ejection fraction 25%      by recent echo (March 07, 2005) and prior Cardiolite (January 2005).  2. Remote myocardial infarctions in 1980, 1981, and 1986.  3. Multi-vessel coronary artery bypass grafting in 1986.  4. Subsequent saphenous vein graft to right coronary artery stent in 1999;      saphenous vein graft to circumflex stent 1998; second diagonal cutting      balloon atherectomy January 2005.  5. Congestive heart failure low output congestive symptom class 3 on      optimal medical therapy.  6. Peripheral arterial disease.      a. Right superficial femoral artery percutaneous transluminal         angioplasty in 1998.      b. Bilateral common iliac stents in  1998.      c. Right common iliac percutaneous transluminal angioplasty for         restenosis January 2005.  7. Exogenous obesity, severe.  8. Chronic obstructive pulmonary disease, chronic cigarette abuse, quit      four weeks ago.  9. Prior history of anemia with ileal arteriovenous malformations.  10.Adult onset diabetes mellitus.  11.Hyperlipidemia.  12.Left renal artery stent for high grade stenosis and renal vascular      hypertension April 2005.  13.Asymptomatic abdominal aortic aneurysm.  14.Sleep apnea.     POSTOPERATIVE DIAGNOSIS:  1. Ischemic dilated cardiomyopathy with documented ejection fraction 25%      by recent echo (March 07, 2005) and prior Cardiolite (January 2005).  2. Remote myocardial infarctions in 1980, 1981, and 1986.  3. Multi-vessel coronary artery bypass grafting in 1986.  4. Subsequent saphenous vein graft to right coronary artery stent in 1999;      saphenous vein graft to circumflex stent 1998; second diagonal cutting  balloon atherectomy January 2005.  5. Congestive heart failure low output congestive symptom class 3 on      optimal medical therapy.  6. Peripheral arterial disease.      a. Right superficial femoral artery percutaneous transluminal         angioplasty in 1998.      b. Bilateral common iliac stents in 1998.      c. Right common iliac percutaneous transluminal angioplasty for         restenosis January 2005.  7. Exogenous obesity, severe.  8. Chronic obstructive pulmonary disease, chronic cigarette abuse, quit      four weeks ago.  9. Prior history of anemia with ileal arteriovenous malformations.  10.Adult onset diabetes mellitus.  11.Hyperlipidemia.  12.Left renal artery stent for high grade stenosis and renal vascular      hypertension April 2005.  13.Asymptomatic abdominal aortic aneurysm.  14.Sleep apnea.    [name] is a 65 year old married father of two with four grandchildren who  is undergoing a non-thoracotomy  ICD implant single chamber for prophylaxis  based on Mobic II criteria with nonischemic cardiomyopathy, prior myocardial  infarction, and EF of 25%.  Informed consent was obtained from the patient  and his family in the office and prior to beginning the procedure.  The  patient's Plavix was on hold for 5-7 days prior to the procedure and he was  on continued baby aspirin and cardiac medications.  Preoperative  laboratories showed normal CBC and diff, coags, BMP except for glucose 160,  normal TSH and urinalysis.   DESCRIPTION OF PROCEDURE:  The patient was brought to the second floor EP  lab in a post absorptive state after 5 mg Valium p.o. premedication.  He  received 1 gram vancomycin IV for antibiotic prophylaxis.  1% Xylocaine was  used for local anesthesia and the patient was administered IV anesthesia  with combination Versed, Fentanyl, and Demerol in divided doses during the  procedure.  Because of systemic hypertension not associated with discomfort  and with blood pressures of 220/120, he was given IV Labetalol with  pressures responding to 150/85 and he was in sinus rhythm throughout the  procedure with heart rate in the 70s.  The left anterior chest was prepped  and draped in the usual manner.  A left infraclavicular angled  inferolaterally incision was performed and brought down to the prepectoral  fascia using blunt dissection and electrocautery to control hemostasis.  A  pulse generator pocket was formed in the prepectoral fascial plane using  blunt dissection and electrocautery angled inferomedially.  Prior to the  incision, a left upper extremity venogram was done with mapping using 10 mL  of Omnipaque dye.  The left subclavian vein was entered with an anterior  puncture using an 18 thin wall needle in the extrathoracic subclavian under  road mapping.  A stainless steel J-tip guide-wire was then inserted and a #8 French Safe sheath was inserted.  An ICD lead was then  inserted through the  Safe sheath and the RV lead and coil were positioned near the RV apex and  was stable.  Threshold testing was then performed through the analyzer.  R  waves measured 23.1 millivolts, impedance 1407 ohms, threshold 0.4 volts at  0.5 millisecond pulse width.  Following the procedure, thresholds were again  measured through the device with R waves measuring 25 millivolts, resistance  had come down to 1196 ohms, and threshold was 0.2 volts at 0.5 milliseconds.  The pocket  was irrigated with 500 mg Kanamycin solution.  The electrodes  were hooked to the generator in the proper sequence with the hex nuts  tightened.  The atrial port was plugged with a silicone plug and the single  hex nut tightened.  A high output device, 41 joules, was selected for this  patient which was a Guidant dual chamber device requiring to plug the atrial  port as outlined above.  Ventricular fibrillation was then induced with R on  T through the device with a pacing train of 400 milliseconds for 8 beats and  a premature stimulus of 300 milliseconds.  VF was induced and properly  sensed by the device and the patient was internally cardioverted to sinus  rhythm with a rescue shock of 23 joules.  This provided an adequate safety  margin for this device.  The patient was well sedated during the induction  and defibrillation afterwards without sequelae.  Shock impedance was 46  ohms.  Prior to induction, the device was delivered into the pocket after  the pocket was irrigated with 500 mg Kanamycin solution.  The subcutaneous  tissue was closed with two separate running layers of 2-0 Vicryl suture and  the skin was closed with 4-0 subcuticular Vicryl suture.  After the  successful induction, Steri-Strips were applied.  Fluoroscopy showed good  position of the RV electrode, RV and SVC coils, the electrode was stable.  There was no pneumothorax.  The patient was transferred to the holding area  for  postoperative care and  programming in stable condition.  He was initially programmed to a back up  ICD VVI at a rate of 40 to prevent pacer induced LBPB.  Single tiered device  with VF detection at 185 beats per minute, initial shock of 31 joules  followed by 41 joule shocks for a total of 6.      RAW/MEDQ  D:  03/21/2005  T:  03/21/2005  Job:  [id number]   cc:   Marney Setting. Mitzi Davenport, M.D.  Fax: [phone]   Ardeen Jourdain, M.D.  [address]  [address] [place]  [place] [address]  Fax: [phone]

## 2011-04-28 NOTE — Cardiovascular Report (Signed)
[name], [name]                         ACCOUNT NO.:  [id number]   MEDICAL RECORD NO.:  [id number]                   PATIENT TYPE:  OIB   LOCATION:  2040                                 FACILITY:  [facility]   PHYSICIAN:  Richard A. Rollene Fare, M.D.          DATE OF BIRTH:  1946-04-08   DATE OF PROCEDURE:  01/08/2004  DATE OF DISCHARGE:  01/09/2004                              CARDIAC CATHETERIZATION   PROCEDURES:  1. Retrograde central aortic catheterization.  2. Selective coronary angiography pre and post IC nitroglycerin     administration by a Judkins technique.  3. Saphenous vein graft angiography.  4. Sub selective LIMA, RMA.  5. Abdominal aortic angiogram, mid stream PA projection.  6. LV angiogram, RAO, LAO projection.  7. Weight-adjusted Angiomax bolus plus infusion.  8. P.o. Plavix.  9. Cutting balloon atherectomy.  10.      High grade ostial DX2 stenosis.  11.      Percutaneous transluminal angioplasty.  12.      High grade in-stent restenosis right common iliac, symptomatic.   DESCRIPTION PROCEDURE:  The patient was brought to the second floor CP Lab  in the postabsorptive state with 5 mg Valium p.o. medication.  The right  groin was prepped and draped in the usual manner and 1% Xylocaine was used  for local anesthesia.  CRFA was punctured with an anterior puncture using an  18 thin-walled needle and a 6-French short side-arm sheath was inserted  after dilatation with a 6-French dilator and use of Amplatz stiff wire.  Guidewire exchange was used throughout the procedure.  A short 6-French  sheath was used for the diagnostic procedure, and a long 6-French sheath was  used for the interventional procedure.  Diagnostic coronary angiography was  done with 6-French 4 cm taper preformed Cordis coronary and pigtail  catheters.  Sub selective LIMA and RIMA was done with right coronary  catheter.  Saphenous vein graft angiography was done with the right coronary  catheter.   LV angiogram was done at 25 cc, 14 cc per second, 20 cc, 12 cc  per second in the mid stream.  Pullback pressure CA showed no gradient  across the aortic valve.  Abdominal angiogram was done above the level of  the renal arteries at 25 cc, 20 cc per second.  A second injection was done  above the iliac bifurcation at 20 cc, 28 cc per second.  Right iliac  angiography was done in the oblique projections by hand injection through  the side arm sheath.  Transstenotic gradient measurement of the right common  iliac artery was approximately 50 mmHg.   Catheter was removed.  Sidearm sheath was flushed.  The guidewire was left  in place pending review of the cine angiograms.  The patient tolerated the  diagnostic procedure well.  He was given 2 mg of Versed for sedation and 2  mg of Nubain for back discomfort.  PRESSURES:  LV:  150/8; LVEDP 20 mmHg, A 24 mmHg.  CA:  150/85 mmHg.   There was no gradient across the aortic valve on catheter pullback.   Fluoroscopy  revealed 2 to 3+ __________ circumflex and right coronary  artery.  The previously placed right coronary and saphenous vein graft  stents were not readily visible on fluoroscopy.   The main left coronary artery was normal.   The circumflex artery was totally occluded after a small marginal and small  atrial branch.   The LAD coursed to the apex of the heart where it bifurcated.  There was a  large DX1 that had 40% narrowing at the ostia with good flow at the proximal  third before SP1.  There was a second diagonal that was large and  bifurcated, and had 90% ostial stenosis which was progressed from past  angiogram.   The LAD then had 40% smooth narrowing just beyond the very large bifurcating  SP2 that arose from the mid portion of the LAD.  The remainder of the LAD  was widely patent with no significant stenosis.  It coursed to the apex of  the heart where it bifurcated.  There was normal flow throughout.   There was faint  antegrade flow of several marginal branches from the  occluded circumflex.   The right coronary was totally occluded after a very small conus and atrial  branch with no significant antegrade flow.   There was collaterals to the distal RCA from the LAD system with faint  filling of the PVA and POA which did appear diffusely diseased but were  under filled.   LV ANGIOGRAM:  The LV angiogram demonstrated akinesis of two-third of the  inferior wall, hypokinesis of inferoapical segment, hypokinesis of the mid  anterolateral wall, akinesis of the posteroapical segment.  There was no MR  present.  Ejection fraction was approximately 20% in the RAO and 25-30% in  the LAO projection with no significant MR.   ABDOMINAL AORTIC ANGIOGRAM:  Abdominal aortic angiogram in the mid-stream PA  projection demonstrates diffuse aneurysmal fusiform dilatation of the  infrarenal abdominal aorta beginning with the renal arteries bilaterally.  There appears to be 70% narrowing of the proximal left renal artery which  was single.  The right renal artery had no significant narrowing.   The iliac bifurcation showed two well-placed stents with excellent position.  There was 40% narrowing in the left common iliac stent, but good flow and  good residual movement, and approximately 85-90 focal in-stent restenosis in  the RCA with a 40-50 mm gradient across this.   The brachiocephalic was widely patent as was the proximal RCA.  The RMA was  intact, and the right vertebral was antegrade.   The left subclavian had irregularities without stenosis.  The LIMA was un  grafted and patent, and the left vertebral was large and had antegrade flow.   HISTORY:  This is a 65 year old white married father of three, with three  grandchildren.  He has a long history of cardiovascular disease and poor  compliance.  He still smokes a pack of cigarettes a day despite severe problems since the early 1980's.  He has severe exogenous  obesity,  hyperlipidemia.  He is not aware of any of his medicines, and his wife  administers his medicines.  He has coronary disease as well as peripheral  arterial disease.   He was admitted January 5 with severe iron-deficiency anemia.  He had a pill  camera exam by Dr. Fuller Plan, and apparently had AVM's noted.  He did not have  upper or lower endoscopy at the time, and was placed on iron.  He is not  aware of any new GI bleeding, but he has had dark stools related to the  iron.  He had a negative upper endoscopy and colon on October 21, 2002 by  Dr. Fuller Plan.   The patient had remote PCI's, remote coronary artery bypass graft November  1986.  There was known RCA and circumflex graft occludence, and remote  bilateral iliac stenting and left SFA PTA April 1998.  He has had multiple  intervention of his graft to his right, his graft circumflex remotely.  His  last catheterization by Dr. Tami Ribas October 03, 2002, showed occluded  circumflex, occluded right collaterals as noted above, and 70% DX2 ostial  stenosis.  He was treated medically.  He has known severe LV dysfunction.  He has not had nonsustained VT .  It has not been symptomatic on maximal.  He has been compensated on maximal medical therapy for LV function.  He does  not have a wide QRS and was not felt to be a candidate for biventricular  pacing.  He has had paired PVC's, frequent PVC's on IPAP and Holter monitor,  but no salvos or ventricular tachycardia.  He does not currently fit into  the __________ criteria because he does not have a prolonged QRS for  prophylactic ICD placement, but I think we all would agree needs to be  strongly considered in this patient.   He has accelerated angina over the last several months, popping  nitroglycerin six to eight times a day for substernal chest discomfort.  It  is difficult to tell whether this is angina or GI disease.  I discussed the  case with Dr. Norberto Sorenson T. Fuller Plan prior to admitting  him to the hospital.  We  will ask him to see him in the hospital.  He does have high-grade disease of  his ostial DX2, and this may be a semi-culprit lesion for his chest pain.  He has an abnormal Cardiolite, but this is also December 22, 2003, but this  is also difficult to correlate because of his anatomy and LV dysfunction and  scarring.   It was felt best to intervene at this time.  He has been on aspirin and  Plavix, tolerating this well.  We felt he could tolerate a single bolus of  Angiomax for hopefully simple intervention.   The left coronary was intubated with a JL-4, 6-French Scimed guiding  catheter and the DX2 was crossed with a 0.014 inch Asahi soft wire.  The  ostial DX2 was dilated with a 2.5 #6 Scimed cutting balloon with two  inflations at 4 and 6 atmospheres for 47 seconds.   There was elastic recoil but improvement.  No dissection.  The lesion was then dilated using ____________ technique with a 2.75/ 9 Maverick balloon at  4-61 with good balloon expansion.  Injections after balloon pullback showed  stenosis reduction of 90% to less than 40% with good flow into the large  DX2.  There was no compromised of the LAD.  The dilatation system was  removed.   Since he had high-grade right common iliac in-stent restenosis with his  history, and in order to maintain access, we felt it would be best to dilate  this on the way out, particularly in view of the gradient.  This was dilated  through  the long [place] sheath was an 8 mm x2 cm Cordis Powerflex peripheral  balloon.  This was done for 10 to 12 atmospheres for 60 seconds.  The  balloon was removed and hand injection through the sidearm sheath showed  stenosis reduction of 85%, tandem focal in-stent restenosis to less than 10%  with good flow.  Dilatation system was removed.  Final ACT was 343 seconds.  Angiomax was stopped, and the patient was transferred to the holding area  for postoperative care.  We planned to  remove his sheath when his ACT  normalizes and not restart any heparin or antithrombin agents.  We will  continue him on aspirin and Plavix and will get GI consultation for  persistent anemia.  He may require repeat upper endoscopy pending Dr.  Lynne Leader recommendations.   Unfortunately, if he continues to smoke, he is certainly a major setup for  continued progression of disease, both cardiac and peripheral with his  significant risk factors as outlined above.   CATHETERIZATION DIAGNOSES:  1. Arteriosclerotic heart disease -- unstable angina.  2. Successful culprit lesion cutting balloon atherectomy. Ostial stenosis,     DX2.  3. Total occlusion circumflex and right coronary and occluded saphenous vein     graft to circumflex and occluded saphenous vein graft to right coronary     artery (old), no change on this study.  4. Left-ventricular dysfunction, ejection fraction 20-25%, inferoapical and     posterior akinesis.  5. Systemic hypertension.  6. Severe obesity.  7. Continued cigarette abuse.  8. Hyperlipidemia.  9. Bilateral claudication, successful RCIA in-stent restenosis, percutaneous     transluminal angioplasty  on this study.  10.      Severe iron-deficiency anemia, negative colon and upper endoscopy     November, 2003.  Further evaluation to follow.  Possible arteriovenous     malformations on small-bowel pill exam.  11.      Infrarenal abdominal aortic aneurysm, fusiform, beginning at the     renal arteries and extending to the iliac bifurcation.  12.      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  13.      Paired premature ventricular contractions, no ventricular     tachycardia.  14.      Normal creatinine.  15.      Metabolic syndrome.  16.      Remote bilateral iliac stenting and left superficial femoral     artery/percutaneous transluminal angioplasty April 1998, with known     occlusion to the right superficial femoral artery. 78.      Adult onset  diabetes mellitus.   CARDIAC HISTORY:  1. RCA/PTCA in 1986; subsequent CABG x2, November 1986; stent SVG to RCA Apr 17, 1998 (JJB); Radius stent SVG to circumflex 1999 (TK).  2. Re dilatation SVG to RCA July 1999.  Occluded grafts October 3.  3. Remote myocardial infarction in 1981, 1986.                                               Richard A. Rollene Fare, M.D.    RAW/MEDQ  D:  01/08/2004  T:  01/10/2004  Job:  [id number]   cc:   Pricilla Riffle. Fuller Plan, M.D. [facility]   Ardeen Jourdain, M.D.  [address]  [place] [address]  Fax:  [phone]   CP Lab

## 2011-04-28 NOTE — Cardiovascular Report (Signed)
NAMECLEASON, FEICK                         ACCOUNT NO.:  [id number]   MEDICAL RECORD NO.:  [id number]                   PATIENT TYPE:  INP   LOCATION:  2927                                 FACILITY:  [facility]   PHYSICIAN:  Octavia Heir, M.D.             DATE OF BIRTH:  1946-04-28   DATE OF PROCEDURE:  10/03/2002  DATE OF DISCHARGE:                              CARDIAC CATHETERIZATION   PROCEDURES:  1. Left heart catheterization.  2. Coronary angiography.  3. Left ventriculogram.  4. Saphenous vein graft.  5. Abdominal aortogram.   COMPLICATIONS:  None.   INDICATIONS FOR PROCEDURE:  The patient is a 65 year old male patient of Dr.  Rockey Situ and Dr. Ivery Quale with a history of CAD status post  coronary bypass surgery attaching vein grafts sequentially to 2 OMs and a  vein graft to PDA.  The patient's last catheterization in 1999 revealed a  totally occluded vein graft to the RCA which then went to the PTCA by Dr.  Rollene Fare.  The patient also had a history of PVD status post bilateral  iliac stenting.  His last catheterization in 1999 he also had PTA of the  right iliac stent.  The patient was recently admitted on 10/01/02  complaining of chest pain which had been increasing over the last several  months.  His Cardiolite scan in 1/03 revealed anterolateral scar with mild  peri-infarct ischemia at the apex and an EF of 22%.  He is now here for a  repeat catheterization.   DESCRIPTION OF PROCEDURE:  After an informed consent, the patient was  brought to the cardiac catheterization lab and the right groin was shaved,  prepped, and draped in the usual sterile fashion.  His monitors were  established.  Modified Seldinger technique, #6 arterial sheath inserted into  the right femoral artery.  A 6 French diagnostic catheterization was then  used to perform diagnostic angiography.  This revealed a large left vein  with no significant disease.  LAD was a large vessel  which coursed the apex  and branched into three diagonal branches.  LAD had a 60% mid stenotic  lesion after the takeoff from the 1st small septal perforator.  The  remainder of the LAD was irregular but had no high grade stenosis.  There is  left to left collaterals to 2 small obtuse marginals as well as left to  right collaterals to the distal PDA and posterolateral branch.  The 1st and  2nd diagonals are medium sized vessels.  The 3rd diagonal is a small vessel.  The 2nd diagonal has a 70% ostial lesion.   The left circumflex is totally occluded in its early mid portion.  As  previously stated, there are 2 obtuse marginal via left and left  collaterals.  However, these are small vessels.   The right coronary artery is totally occluded in its proximal segment.  As  previously  stated, there is faint left to right collaterals in the distal  PDA and posterolateral branch.   Saphenous vein graft sequentially to OM1 and OM2 is totally occluded at its  ostium.   Saphenous vein graft to the PDA is totally occluded within the previously  placed proximal vein graft stent.   Left ventriculogram reveals severely depressed  EF at 10-15% with global  hypokinesis.   Distal abdominal aortogram reveals small distal abdominal aortic aneurysm.  There are bilateral iliac stents present with a 30% left end-stent  restenosis and 50% right end-stent restenosis.   Hemodynamic system, gradient pressure of 0000000, LV systolic pressure  99991111, LVEDP of 24.   CONCLUSION:  1. Significant three vessel coronary artery disease.  2. Totally occluded vein graft to OM1 and OM2.  3. Totally occluded vein graft to PDA.  4. Severely depressed LV systolic function.  5. Small distal abdominal aneurysm with disease but patent bilateral iliac     stents.  6. Elevated LV EDP.                                                 Octavia Heir, M.D.    RHM/MEDQ  D:  10/03/2002  T:  10/04/2002  Job:   [id number]   cc:   Delfino Lovett A. Rollene Fare, M.D.  [phone], [place] [address]  Fax: [phone]   Ardeen Jourdain, M.D.

## 2011-04-28 NOTE — Discharge Summary (Signed)
[name], [name]               ACCOUNT NO.:  [id number]   MEDICAL RECORD NO.:  [id number]          PATIENT TYPE:  INP   LOCATION:  4708                         FACILITY:  [facility]   PHYSICIAN:  Richard A. Rollene Fare, M.D.DATE OF BIRTH:  Sep 24, 1946   DATE OF ADMISSION:  DATE OF DISCHARGE:                                 DISCHARGE SUMMARY   ADDENDUM:  Cc as noted.      BB/MEDQ  D:  03/22/2005  T:  03/22/2005  Job:  [id number]   cc:   Ardeen Jourdain, M.D.  [address]  [place]  [place] [address]  Fax: [place] T. Fuller Plan, M.D. [facility]   Dominica Severin B. Benay Spice, M.D.  [place] [address]  Fax: [phone]

## 2011-04-28 NOTE — Cardiovascular Report (Signed)
NAME:  [name], [name]                         ACCOUNT NO.:  [id number]   MEDICAL RECORD NO.:  [id number]                   PATIENT TYPE:  OIB   LOCATION:  4727                                 FACILITY:  [facility]   PHYSICIAN:  Richard A. Rollene Fare, M.D.          DATE OF BIRTH:  23-Apr-1946   DATE OF PROCEDURE:  03/24/2004  DATE OF DISCHARGE:                              CARDIAC CATHETERIZATION   ADDENDUM:  During diagnostic procedure, arterial pressures were monitored  and ranged from 150 mmHg with sinus rhythm on EKG.  The patient tolerated  the diagnostic procedure well.   Abdominal aortic angiogram showed dilatation of the suprarenal aorta without  definite aneurysm formation.  There was patent celiac and SMA axis with no  stenosis.   The right renal arteries were double moderate size with no significant  stenosis.   The left renal artery had 85% concentric stenosis with post stenotic  dilatation in the ostia and proximal portion.   The IMA was intact.   There was diffuse aneurysmal dilatation of the infrarenal abdominal aorta  extending almost from the origin of the left renal artery just above the  iliac bifurcation.   Bilateral iliac stents were seen fluoroscopically.  The left had 30-40%  narrowing or less.  The right had 40% narrowing.  The common iliacs had no  significant stenosis.  The hypogastrics were intact bilaterally.   The left LEIA and LCFA showed no significant stenosis and the left profunda  was intact.   The left SFA had irregularities throughout the proximal and mid portion up  to Hunter's canal.   The previous area of remote PTA (March 13, 1997) had less than 30-40%  residual narrowing near Hunter's canal with excellent flow.  The left  popliteal had irregularity with no significant stenosis.   The LAD had 90% segmental stenosis in the proximal third.  The left peroneal  was intact and left posterior tibial was widely patent and a dominant flow  to the  left foot.  There was three-vessel run off to the LLE.   The right external iliac showed no significant stenosis and the RCFA had no  significant stenosis.   The right profunda was intact with no significant stenosis seen.   The proximal right SFA had approximately 40% narrowing segmentally in the  proximal portion.  The remainder of the vessel of mid portion had  irregularities and there was 30-40% and 50% tandem narrowing of the distal  right SFA near Hunter's canal with good flow.  The right popliteal had  irregularities with no significant stenosis.  The RAT had 90% stenosis in  the mid portion. The RPT had 70% proximal stenosis.  The right peroneal was  intact and was three-vessel run off to the RLE.   INTERVENTIONAL PROCEDURE:  The patient was given 3500 units of heparin.  He  had been on aspirin and Plavix as an outpatient.  The left renal  artery was  intubated with a 6 [place] JR-4 guiding catheter and the selective hand  injections were performed.  Left renal artery stenosis was crossed with a  0.014 inch Cordis soft tip guide wire.  The lesion was then crossed with a  premounted Cordis 6 mm x 12 mm Genesis stent.  This was precisely positioned  just beyond the left renal ostia and deployed at 8-40.  It was post dilated  at 10-40.   Scout injection was obtained and showed some mild residual stenosis so the  balloon was upgraded to a Guidant Viatrac 14 7.0 x 15-mm balloon and the  stent and ostium were dilated at 9-40.  The balloon was pulled back.  The  wire was pulled back.  Final injection showed excellent angiographic result  with stenosis reduction from 85% to 0.  No dissection.  Good flow and no  significant residual stenosis with good transition to the post stenotically  dilated area.   The patient has had successful left renal artery PTA and stenting.  We will  do followup renal Dopplers and continued medical therapy and followup his  blood pressure and adjust  medications as outpatient.  Followup renal  function.   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  He has  exogenous obesity, metabolic syndrome, GI bleeding with heme-positive stools  and recent therapy and endoscopy with normal and no hematologic  abnormalities with normalization of his hemoglobin.  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   He has severe chronic coronary artery disease and known peripheral arterial  disease.   From a cardiac standpoint, he has had remote myocardial infarctions in 1980,  1981 April and November 1986.  He had two vessel coronary artery bypass  graft by Dr. Redmond Pulling November 04, 1985 with vein grafts to the circumflex and  PDA.  He has migraine headaches, continued cigarette abuse, hyperlipidemia,  exogenous obesity.  From a cardiac standpoint, his last catheterization for  symptomatic angina was January 08, 2004 and he had high grade 90% diagonal  two ostial stenosis which was treated with cutting balloon atherectomy  successfully.  He had no significant LAD disease (40% mid, 40% DX1, old  total occluded circumflex and old total occluded proximal right).  Also, his  vein grafts to the circumflex and right were occluded.  He had inferior and  posterior apical hypokinesis and an EF in the 25-30% range.  He was found to  have infrarenal aortic dilatation consistent with aneurysm and high grade  left renal artery stenosis with systemic hypertension.  Also at that  intervention, he had in-stent restenosis of his right common iliac remote  stent.   The patient has done well cardiac wise.  His hemoglobin has normalized.  He  has not had any further positive stools.  He does have bilateral claudication that is  mild and much improved over the  last several years.   He has high grade left renal artery stenosis with a 4.0 aortic renal ratio  and 290 cm per second velocity of the left renal artery.  Abdominal  ultrasound showed a 3.4 cm infrarenal abdominal aortic diameter compatible  with his abdominal aortic aneurysm.   He has not had recent lower extremity Dopplers.   It is  of note that this patient who had an occluded right SFA with profunda  collaterals at angiography February 1998.  His right SFA is now widely  patent and this suggests that there was either misinterpretation of his  prior angiogram or he has recannulized his proximal right SFA.  In any  event, he has good straight line flow to his right foot although the tibials  are diseased as outlined.  He has no significant restenosis of his left or  right iliac stents which were placed March 1998 using kissing balloon  technique.  He also has no restenosis of his left SFA PTA site from March 13, 1997 (POBA PTA).   It is recommended that he discontinue smoking, go on an exercise program,  try to lose weight.  He has not been able to successfully do this in the  past.  Continue medical therapy of his coronary disease, tibial disease and  continued surveillance followup of his infrarenal abdominal aortic aneurysm.                                               Richard A. Rollene Fare, M.D.    RAW/MEDQ  D:  03/24/2004  T:  03/25/2004  Job:  [id number]   cc:   Izola Price. Benay Spice, M.D.  [place] [place] [address]  Fax: [phone]   Ardeen Jourdain, M.D.  [address]  [address] [place]  [place] [address]  Fax: [phone]

## 2011-09-01 ENCOUNTER — Encounter: Payer: Self-pay | Admitting: Internal Medicine

## 2011-09-01 ENCOUNTER — Ambulatory Visit (INDEPENDENT_AMBULATORY_CARE_PROVIDER_SITE_OTHER): Payer: Medicare Other | Admitting: Internal Medicine

## 2011-09-01 VITALS — BP 118/70 | HR 56 | Temp 98.0°F | Wt 218.0 lb

## 2011-09-01 DIAGNOSIS — J40 Bronchitis, not specified as acute or chronic: Secondary | ICD-10-CM

## 2011-09-01 MED ORDER — AZITHROMYCIN 250 MG PO TABS: ORAL_TABLET | ORAL | Status: AC

## 2011-09-01 MED ORDER — PROMETHAZINE-CODEINE 6.25-10 MG/5ML PO SYRP: 5.0000 mL | ORAL_SOLUTION | ORAL | Status: AC | PRN

## 2011-09-01 NOTE — Progress Notes (Signed)
  Subjective:    Patient ID: [name], male    DOB: 07-09-1946, 65 y.o.   MRN: [id number]  HPI   HPI  C/o URI sx's x   3-4 days. C/o ST, cough, weakness. Not better with OTC medicines. Actually, the patient is getting worse. The patient did not sleep last night due to cough.  Review of Systems  Constitutional: Positive for fever, chills and fatigue.  HENT: Positive for congestion, rhinorrhea, sneezing and postnasal drip.   Eyes: Positive for photophobia and pain. Negative for discharge and visual disturbance.  Respiratory: Positive for cough and wheezing.   Positive for chest pain.  Gastrointestinal: Negative for vomiting, abdominal pain, diarrhea and abdominal distention.  Genitourinary: Negative for dysuria and difficulty urinating.  Skin: Negative for rash.  Neurological: Positive for dizziness, weakness and light-headedness.      Review of Systems     Objective:   Physical Exam  Constitutional: He is oriented to person, place, and time. He appears well-developed. No distress.  HENT:  Mouth/Throat: Oropharynx is clear and moist.  Eyes: Conjunctivae are normal. Pupils are equal, round, and reactive to light.  Neck: Normal range of motion. No JVD present. No thyromegaly present.  Cardiovascular: Normal rate, regular rhythm, normal heart sounds and intact distal pulses.  Exam reveals no gallop and no friction rub.   No murmur heard. Pulmonary/Chest: Effort normal and breath sounds normal. No respiratory distress. He has no wheezes. He has no rales. He exhibits no tenderness.  Abdominal: Soft. Bowel sounds are normal. He exhibits no distension and no mass. There is no tenderness. There is no rebound and no guarding.  Musculoskeletal: Normal range of motion. He exhibits no edema and no tenderness.  Lymphadenopathy:    He has no cervical adenopathy.  Neurological: He is alert and oriented to person, place, and time. He has normal reflexes. No cranial nerve deficit. He  exhibits normal muscle tone. Coordination normal.  Skin: Skin is warm and dry. No rash noted.  Psychiatric: His behavior is normal. Judgment and thought content normal.          Assessment & Plan:

## 2011-09-01 NOTE — Assessment & Plan Note (Signed)
Continue with current prescription therapy as reflected on the Med list.  

## 2011-09-03 ENCOUNTER — Emergency Department (HOSPITAL_COMMUNITY)
Admission: EM | Admit: 2011-09-03 | Discharge: 2011-09-03 | Disposition: A | Discharge status: Home / Self Care | Payer: Medicare Other | Attending: Emergency Medicine | Admitting: Emergency Medicine

## 2011-09-03 DIAGNOSIS — I252 Old myocardial infarction: Secondary | ICD-10-CM | POA: Insufficient documentation

## 2011-09-03 DIAGNOSIS — I2510 Atherosclerotic heart disease of native coronary artery without angina pectoris: Secondary | ICD-10-CM | POA: Insufficient documentation

## 2011-09-03 DIAGNOSIS — E119 Type 2 diabetes mellitus without complications: Secondary | ICD-10-CM | POA: Insufficient documentation

## 2011-09-03 DIAGNOSIS — I10 Essential (primary) hypertension: Secondary | ICD-10-CM | POA: Insufficient documentation

## 2011-09-03 DIAGNOSIS — J449 Chronic obstructive pulmonary disease, unspecified: Secondary | ICD-10-CM | POA: Insufficient documentation

## 2011-09-03 DIAGNOSIS — J4489 Other specified chronic obstructive pulmonary disease: Secondary | ICD-10-CM | POA: Insufficient documentation

## 2011-09-03 DIAGNOSIS — E785 Hyperlipidemia, unspecified: Secondary | ICD-10-CM | POA: Insufficient documentation

## 2011-09-03 DIAGNOSIS — I509 Heart failure, unspecified: Secondary | ICD-10-CM | POA: Insufficient documentation

## 2011-09-03 DIAGNOSIS — Z951 Presence of aortocoronary bypass graft: Secondary | ICD-10-CM | POA: Insufficient documentation

## 2011-09-03 DIAGNOSIS — K219 Gastro-esophageal reflux disease without esophagitis: Secondary | ICD-10-CM | POA: Insufficient documentation

## 2011-09-03 DIAGNOSIS — R10819 Abdominal tenderness, unspecified site: Secondary | ICD-10-CM | POA: Insufficient documentation

## 2011-09-03 DIAGNOSIS — R109 Unspecified abdominal pain: Secondary | ICD-10-CM | POA: Insufficient documentation

## 2011-09-03 DIAGNOSIS — R339 Retention of urine, unspecified: Secondary | ICD-10-CM | POA: Insufficient documentation

## 2011-09-03 LAB — URINALYSIS, ROUTINE W REFLEX MICROSCOPIC
Bilirubin Urine: NEGATIVE
Glucose, UA: NEGATIVE mg/dL
Ketones, ur: 15 mg/dL — AB
Leukocytes, UA: NEGATIVE
Nitrite: NEGATIVE
Protein, ur: NEGATIVE mg/dL
Specific Gravity, Urine: 1.011 (ref 1.005–1.030)
Urobilinogen, UA: 1 mg/dL (ref 0.0–1.0)
pH: 5 (ref 5.0–8.0)

## 2011-09-03 LAB — URINE MICROSCOPIC-ADD ON

## 2011-09-04 ENCOUNTER — Telehealth: Payer: Self-pay

## 2011-09-04 LAB — URINE CULTURE
Colony Count: NO GROWTH
Culture  Setup Time: 201209231750
Culture: NO GROWTH

## 2011-09-04 NOTE — Telephone Encounter (Signed)
[facility] Triage Call Report Triage Record Num: [id number] Operator: Jerene Bears Patient Name: [name] Call Date & Time: 09/01/2011 7:16:20PM Patient Phone: [phone] PCP: Walker Kehr Patient Gender: Male PCP Fax : [phone] Patient DOB: 05-06-1946 Practice Name: [facility] Reason for Call: PCP is Plotnikov, Alex. Callback number is [phone]. [name], Spouse, calling regarding RX. Pt was seen by Dr. Alain Marion on 09/01/11 (+) Bronchitis. Spouse went to [facility] and the prescriptions were not available. Advised to call office in AM. Encouraged spouse to call if any symptoms develop. Protocol(s) Used: Office Note Recommended Outcome per Protocol: Information Noted and Sent to Office Reason for Outcome: Caller information to office Care Advice: ~ 09/01/2011 7:22:33PM Page 1 of 1 [email or web address]

## 2011-09-06 ENCOUNTER — Emergency Department (HOSPITAL_COMMUNITY)
Admission: EM | Admit: 2011-09-06 | Discharge: 2011-09-07 | Disposition: A | Discharge status: Home / Self Care | Payer: Medicare Other | Attending: Emergency Medicine | Admitting: Emergency Medicine

## 2011-09-06 DIAGNOSIS — R0989 Other specified symptoms and signs involving the circulatory and respiratory systems: Secondary | ICD-10-CM | POA: Insufficient documentation

## 2011-09-06 DIAGNOSIS — J4489 Other specified chronic obstructive pulmonary disease: Secondary | ICD-10-CM | POA: Insufficient documentation

## 2011-09-06 DIAGNOSIS — I252 Old myocardial infarction: Secondary | ICD-10-CM | POA: Insufficient documentation

## 2011-09-06 DIAGNOSIS — R61 Generalized hyperhidrosis: Secondary | ICD-10-CM | POA: Insufficient documentation

## 2011-09-06 DIAGNOSIS — R05 Cough: Secondary | ICD-10-CM | POA: Insufficient documentation

## 2011-09-06 DIAGNOSIS — N390 Urinary tract infection, site not specified: Secondary | ICD-10-CM | POA: Insufficient documentation

## 2011-09-06 DIAGNOSIS — R059 Cough, unspecified: Secondary | ICD-10-CM | POA: Insufficient documentation

## 2011-09-06 DIAGNOSIS — E119 Type 2 diabetes mellitus without complications: Secondary | ICD-10-CM | POA: Insufficient documentation

## 2011-09-06 DIAGNOSIS — K219 Gastro-esophageal reflux disease without esophagitis: Secondary | ICD-10-CM | POA: Insufficient documentation

## 2011-09-06 DIAGNOSIS — J449 Chronic obstructive pulmonary disease, unspecified: Secondary | ICD-10-CM | POA: Insufficient documentation

## 2011-09-06 DIAGNOSIS — I10 Essential (primary) hypertension: Secondary | ICD-10-CM | POA: Insufficient documentation

## 2011-09-06 DIAGNOSIS — I2510 Atherosclerotic heart disease of native coronary artery without angina pectoris: Secondary | ICD-10-CM | POA: Insufficient documentation

## 2011-09-06 DIAGNOSIS — Z7982 Long term (current) use of aspirin: Secondary | ICD-10-CM | POA: Insufficient documentation

## 2011-09-06 DIAGNOSIS — R509 Fever, unspecified: Secondary | ICD-10-CM | POA: Insufficient documentation

## 2011-09-06 DIAGNOSIS — Z79899 Other long term (current) drug therapy: Secondary | ICD-10-CM | POA: Insufficient documentation

## 2011-09-06 DIAGNOSIS — R0609 Other forms of dyspnea: Secondary | ICD-10-CM | POA: Insufficient documentation

## 2011-09-06 DIAGNOSIS — E785 Hyperlipidemia, unspecified: Secondary | ICD-10-CM | POA: Insufficient documentation

## 2011-09-06 DIAGNOSIS — I509 Heart failure, unspecified: Secondary | ICD-10-CM | POA: Insufficient documentation

## 2011-09-06 DIAGNOSIS — R6883 Chills (without fever): Secondary | ICD-10-CM | POA: Insufficient documentation

## 2011-09-06 DIAGNOSIS — R300 Dysuria: Secondary | ICD-10-CM | POA: Insufficient documentation

## 2011-09-07 ENCOUNTER — Emergency Department (HOSPITAL_COMMUNITY): Payer: Medicare Other

## 2011-09-07 LAB — CBC
HCT: 36.3 % — ABNORMAL LOW (ref 39.0–52.0)
Hemoglobin: 11.8 g/dL — ABNORMAL LOW (ref 13.0–17.0)
MCH: 28.2 pg (ref 26.0–34.0)
MCHC: 32.5 g/dL (ref 30.0–36.0)
MCV: 86.6 fL (ref 78.0–100.0)
Platelets: 136 10*3/uL — ABNORMAL LOW (ref 150–400)
RBC: 4.19 MIL/uL — ABNORMAL LOW (ref 4.22–5.81)
RDW: 14.5 % (ref 11.5–15.5)
WBC: 8.8 10*3/uL (ref 4.0–10.5)

## 2011-09-07 LAB — URINALYSIS, ROUTINE W REFLEX MICROSCOPIC
Glucose, UA: NEGATIVE mg/dL
Ketones, ur: 15 mg/dL — AB
Nitrite: NEGATIVE
Protein, ur: 100 mg/dL — AB
Specific Gravity, Urine: 1.027 (ref 1.005–1.030)
Urobilinogen, UA: 1 mg/dL (ref 0.0–1.0)
pH: 5 (ref 5.0–8.0)

## 2011-09-07 LAB — CK TOTAL AND CKMB (NOT AT ARMC)
CK, MB: 2 ng/mL (ref 0.3–4.0)
Relative Index: INVALID (ref 0.0–2.5)
Total CK: 81 U/L (ref 7–232)

## 2011-09-07 LAB — BASIC METABOLIC PANEL
BUN: 18 mg/dL (ref 6–23)
CO2: 24 mEq/L (ref 19–32)
Calcium: 8.9 mg/dL (ref 8.4–10.5)
Chloride: 104 mEq/L (ref 96–112)
Creatinine, Ser: 0.74 mg/dL (ref 0.50–1.35)
GFR calc Af Amer: >60 mL/min (ref >60)
GFR calc non Af Amer: >60 mL/min (ref >60)
Glucose, Bld: 129 mg/dL — ABNORMAL HIGH (ref 70–99)
Potassium: 3.9 mEq/L (ref 3.5–5.1)
Sodium: 139 mEq/L (ref 135–145)

## 2011-09-07 LAB — URINE MICROSCOPIC-ADD ON

## 2011-09-07 LAB — DIFFERENTIAL
Basophils Absolute: 0 10*3/uL (ref 0.0–0.1)
Basophils Relative: 0 % (ref 0–1)
Eosinophils Absolute: 0.1 10*3/uL (ref 0.0–0.7)
Eosinophils Relative: 1 % (ref 0–5)
Lymphocytes Relative: 5 % — ABNORMAL LOW (ref 12–46)
Lymphs Abs: 0.4 10*3/uL — ABNORMAL LOW (ref 0.7–4.0)
Monocytes Absolute: 0.5 10*3/uL (ref 0.1–1.0)
Monocytes Relative: 6 % (ref 3–12)
Neutro Abs: 7.8 10*3/uL — ABNORMAL HIGH (ref 1.7–7.7)
Neutrophils Relative %: 88 % — ABNORMAL HIGH (ref 43–77)

## 2011-09-07 LAB — TROPONIN I: Troponin I: <0.3 ng/mL (ref <0.30)

## 2011-09-07 LAB — PRO B NATRIURETIC PEPTIDE: Pro B Natriuretic peptide (BNP): 740.7 pg/mL — ABNORMAL HIGH (ref 0–125)

## 2011-09-21 LAB — LIPID PANEL
Cholesterol: 157
Cholesterol: 164
Cholesterol: 188
HDL: 35 — ABNORMAL LOW
HDL: 36 — ABNORMAL LOW
HDL: 43
LDL Cholesterol: 112 — ABNORMAL HIGH
LDL Cholesterol: 85
LDL Cholesterol: 86
Total CHOL/HDL Ratio: 4.4
Total CHOL/HDL Ratio: 4.5
Total CHOL/HDL Ratio: 4.6
Triglycerides: 167 — ABNORMAL HIGH
Triglycerides: 184 — ABNORMAL HIGH
Triglycerides: 211 — ABNORMAL HIGH
VLDL: 33
VLDL: 37
VLDL: 42 — ABNORMAL HIGH

## 2011-09-21 LAB — BASIC METABOLIC PANEL
BUN: 11
CO2: 29
Calcium: 8.9
Chloride: 101
Creatinine, Ser: 0.78
GFR calc Af Amer: >60
GFR calc non Af Amer: >60
Glucose, Bld: 103 — ABNORMAL HIGH
Potassium: 4.3
Sodium: 138

## 2011-09-21 LAB — DIFFERENTIAL
Basophils Absolute: 0
Basophils Absolute: 0
Basophils Relative: 0
Basophils Relative: 0
Eosinophils Absolute: 0.2
Eosinophils Absolute: 0.3
Eosinophils Relative: 3
Eosinophils Relative: 4
Lymphocytes Relative: 18
Lymphocytes Relative: 24
Lymphs Abs: 1.7
Lymphs Abs: 1.7
Monocytes Absolute: 0.6
Monocytes Absolute: 0.6
Monocytes Relative: 6
Monocytes Relative: 8
Neutro Abs: 4.5
Neutro Abs: 7.1
Neutrophils Relative %: 63
Neutrophils Relative %: 74

## 2011-09-21 LAB — HEMOGLOBIN A1C
Hgb A1c MFr Bld: 9.9 — ABNORMAL HIGH
Mean Plasma Glucose: 275

## 2011-09-21 LAB — COMPREHENSIVE METABOLIC PANEL
ALT: 25
ALT: 27
AST: 22
AST: 25
Albumin: 3.5
Albumin: 3.7
Alkaline Phosphatase: 153 — ABNORMAL HIGH
Alkaline Phosphatase: 163 — ABNORMAL HIGH
BUN: 10
BUN: 8
CO2: 27
CO2: 27
Calcium: 9
Calcium: 9.5
Chloride: 100
Chloride: 98
Creatinine, Ser: 0.65
Creatinine, Ser: 0.69
GFR calc Af Amer: >60
GFR calc Af Amer: >60
GFR calc non Af Amer: >60
GFR calc non Af Amer: >60
Glucose, Bld: 179 — ABNORMAL HIGH
Glucose, Bld: 239 — ABNORMAL HIGH
Potassium: 4.3
Potassium: 4.4
Sodium: 133 — ABNORMAL LOW
Sodium: 135
Total Bilirubin: 0.8
Total Bilirubin: 0.8
Total Protein: 6.3
Total Protein: 7

## 2011-09-21 LAB — B-NATRIURETIC PEPTIDE (CONVERTED LAB)
Pro B Natriuretic peptide (BNP): 47
Pro B Natriuretic peptide (BNP): 62
Pro B Natriuretic peptide (BNP): 69

## 2011-09-21 LAB — CBC
HCT: 39.3
HCT: 40
HCT: 45
Hemoglobin: 13.3
Hemoglobin: 13.3
Hemoglobin: 15.4
MCHC: 33.2
MCHC: 33.8
MCHC: 34.1
MCV: 86.3
MCV: 88.2
MCV: 88.6
Platelets: 139 — ABNORMAL LOW
Platelets: 160
Platelets: 231
RBC: 4.46
RBC: 4.51
RBC: 5.21
RDW: 13.5
RDW: 13.5
RDW: 13.8
WBC: 7
WBC: 7.7
WBC: 9.7

## 2011-09-21 LAB — CARDIAC PANEL(CRET KIN+CKTOT+MB+TROPI)
CK, MB: 1.4
CK, MB: 1.8
Relative Index: INVALID
Relative Index: INVALID
Total CK: 62
Total CK: 65
Troponin I: 0.02
Troponin I: 0.04

## 2011-09-21 LAB — PROTIME-INR
INR: 0.9
Prothrombin Time: 12.2

## 2011-09-21 LAB — TSH
TSH: 2.272
TSH: 2.573

## 2011-09-21 LAB — I-STAT 8, (EC8 V) (CONVERTED LAB)
Acid-Base Excess: 3 — ABNORMAL HIGH
BUN: 9
Bicarbonate: 28.5 — ABNORMAL HIGH
Chloride: 102
Glucose, Bld: 249 — ABNORMAL HIGH
HCT: 48
Hemoglobin: 16.3
Operator id: 151321
Potassium: 5
Sodium: 133 — ABNORMAL LOW
TCO2: 30
pCO2, Ven: 45
pH, Ven: 7.41 — ABNORMAL HIGH

## 2011-09-21 LAB — CK TOTAL AND CKMB (NOT AT ARMC)
CK, MB: 1.9
Relative Index: INVALID
Total CK: 73

## 2011-09-21 LAB — POCT I-STAT CREATININE
Creatinine, Ser: 0.7
Operator id: 151321

## 2011-09-21 LAB — POCT CARDIAC MARKERS
CKMB, poc: 1.4
Myoglobin, poc: 54.1
Operator id: 151321
Troponin i, poc: <0.05

## 2011-09-21 LAB — MAGNESIUM: Magnesium: 1.9

## 2011-09-21 LAB — TROPONIN I: Troponin I: 0.02

## 2011-09-21 LAB — APTT: aPTT: 25

## 2011-09-26 LAB — I-STAT 8, (EC8 V) (CONVERTED LAB)
Acid-Base Excess: 2
BUN: 19
Bicarbonate: 28.9 — ABNORMAL HIGH
Chloride: 100
Glucose, Bld: 402 — ABNORMAL HIGH
HCT: 56 — ABNORMAL HIGH
Hemoglobin: 19 — ABNORMAL HIGH
Operator id: 272551
Potassium: 4.3
Sodium: 134 — ABNORMAL LOW
TCO2: 30
pCO2, Ven: 51.6 — ABNORMAL HIGH
pH, Ven: 7.356 — ABNORMAL HIGH

## 2011-09-26 LAB — POCT CARDIAC MARKERS
CKMB, poc: <1 — ABNORMAL LOW
Myoglobin, poc: 58.1
Operator id: 272551
Troponin i, poc: <0.05

## 2011-09-26 LAB — DIFFERENTIAL
Basophils Absolute: 0
Basophils Relative: 0
Eosinophils Absolute: 0.4
Eosinophils Relative: 4
Lymphocytes Relative: 21
Lymphs Abs: 1.9
Monocytes Absolute: 0.7
Monocytes Relative: 8
Neutro Abs: 6.1
Neutrophils Relative %: 67

## 2011-09-26 LAB — CBC
HCT: 49.4
Hemoglobin: 16.4
MCHC: 33.2
MCV: 88.5
Platelets: 211
RBC: 5.58
RDW: 13
WBC: 9.1

## 2011-09-26 LAB — POCT I-STAT CREATININE
Creatinine, Ser: 0.8
Operator id: 272551

## 2011-11-10 ENCOUNTER — Other Ambulatory Visit: Payer: Self-pay

## 2011-11-10 DIAGNOSIS — I6529 Occlusion and stenosis of unspecified carotid artery: Secondary | ICD-10-CM

## 2011-11-13 ENCOUNTER — Ambulatory Visit (INDEPENDENT_AMBULATORY_CARE_PROVIDER_SITE_OTHER): Payer: Medicare Other | Admitting: *Deleted

## 2011-11-13 DIAGNOSIS — Z23 Encounter for immunization: Secondary | ICD-10-CM

## 2011-11-15 DIAGNOSIS — Z23 Encounter for immunization: Secondary | ICD-10-CM

## 2011-12-28 ENCOUNTER — Encounter: Payer: Self-pay | Admitting: Vascular Surgery

## 2011-12-29 ENCOUNTER — Encounter (HOSPITAL_COMMUNITY): Payer: Self-pay | Admitting: Pharmacy Technician

## 2011-12-29 ENCOUNTER — Encounter: Payer: Self-pay | Admitting: Vascular Surgery

## 2011-12-29 ENCOUNTER — Other Ambulatory Visit (INDEPENDENT_AMBULATORY_CARE_PROVIDER_SITE_OTHER): Payer: Medicare Other | Admitting: *Deleted

## 2011-12-29 ENCOUNTER — Ambulatory Visit (INDEPENDENT_AMBULATORY_CARE_PROVIDER_SITE_OTHER): Payer: Medicare Other | Admitting: Vascular Surgery

## 2011-12-29 ENCOUNTER — Encounter: Payer: Self-pay | Admitting: *Deleted

## 2011-12-29 VITALS — BP 124/61 | HR 66 | Resp 16 | Ht 68.0 in | Wt 237.0 lb

## 2011-12-29 DIAGNOSIS — I6529 Occlusion and stenosis of unspecified carotid artery: Secondary | ICD-10-CM

## 2011-12-29 DIAGNOSIS — I714 Abdominal aortic aneurysm, without rupture, unspecified: Secondary | ICD-10-CM | POA: Insufficient documentation

## 2011-12-29 NOTE — Progress Notes (Signed)
[facility]  New Carotid Patient  Referred by:  Rebecca Eaton, MD [address] 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 male who presents with chief complaint: "abnormal neck studies".  Previous carotid studies demonstrated: RICA occlusion, LICA [id number] stenosis.  Patient has no history of TIA or stroke symptom.  The patient has never had amaurosis fugax or monocular blindness.  The patient has never had facial drooping or hemiplegia.  The patient has never had receptive or expressive aphasia.   The patient's previous neurologic deficits have never resolved.  The patient's risks factors for carotid disease include: DM, HTN, and h/o smoking.  Additionally, the patient has a known history of AAA.  He denies abd or back pain.  His risk factors for AAA included: male, age, and former smoking.  Past Medical History  Diagnosis Date  . Myocardial infarction   . Hypertension   . CHF (congestive heart failure)   . COPD (chronic obstructive pulmonary disease)   . Diabetes mellitus   . GERD (gastroesophageal reflux disease)   . AAA (abdominal aortic aneurysm)   . Tremor   . Sleep apnea     Past Surgical History  Procedure Date  . Coronary artery bypass graft     History   Social History  . Marital Status: Married    Spouse Name: [name]    Number of Children: [name]  . Years of Education: [name]   Occupational History  . Not on file.   Social History Main Topics  . Smoking status: Former Smoker    Quit date: 12/11/2002  . Smokeless tobacco: Not on file  . Alcohol Use: 0.5 oz/week    1 drink(s) per week  . Drug Use: No  . Sexually Active: Not on file   Other Topics Concern  . Not on file   Social History Narrative  . No narrative on file    Family History  Problem Relation Age of Onset  . Colon cancer Mother   .  Diabetes Sister   . Heart disease Sister   . Heart disease Brother   . Lung cancer Maternal Grandmother     Current Outpatient Prescriptions on File Prior to Visit  Medication Sig Dispense Refill  . amiodarone (CORDARONE) 200 MG tablet Take 200 mg by mouth daily.       Marland Kitchen atorvastatin (LIPITOR) 40 MG tablet Take 40 mg by mouth daily.        . carvedilol (COREG) 25 MG tablet Take 25 mg by mouth 2 (two) times daily with a meal.        . clopidogrel (PLAVIX) 75 MG tablet Take 75 mg by mouth daily.        . Cranberry 300 MG tablet Take 300 mg by mouth daily.        . fish oil-omega-3 fatty acids 1000 MG capsule Take 2 g by mouth daily.        . furosemide (LASIX) 40 MG tablet Take 40 mg by mouth daily.        Marland Kitchen glucose blood (ONE TOUCH TEST STRIPS) test strip 1 each by Other route as needed. Use as instructed       . Ipratropium-Albuterol (COMBIVENT IN) Inhale 2 puffs into the lungs daily as needed. For shortness of breath      .  isosorbide mononitrate (IMDUR) 60 MG 24 hr tablet Take 60 mg by mouth daily. Take 1 tablet every am and 0.5 tablet at bedtime      . multivitamin-lutein (OCUVITE-LUTEIN) CAPS Take 1 capsule by mouth daily.        . nitroGLYCERIN (NITROSTAT) 0.4 MG SL tablet Place 0.4 mg under the tongue every 5 (five) minutes as needed.        . ONE TOUCH LANCETS MISC by Does not apply route daily.        Marland Kitchen spironolactone (ALDACTONE) 25 MG tablet Take 25 mg by mouth daily.          Allergies  Allergen Reactions  . Ace Inhibitors     REACTION: cough  . Penicillins Other (See Comments)    "childhood allergy"  . Tussionex Pennkinetic Er (Hydrocod Polst-Cpm Polst) Other (See Comments)    "caused prostate problems"    REVIEW OF SYSTEMS:  (Positives checked otherwise negative)  CARDIOVASCULAR: [ ]  chest pain    [ ]  chest pressure    [ ]  palpitations    [ ]  orthopnea   [ ]  dyspnea on exert. [ ]  claudication    [ ]  rest pain     [ ]  DVT     [ ]  phlebitis  PULMONARY:    [ ]   productive cough [ ]  asthma  [ ]  wheezing  NEUROLOGIC:    [ ]  weakness    [ ]  paresthesias   [ ]  aphasia    [ ]  amaurosis    [ ]  dizziness  HEMATOLOGIC:    [ ]  bleeding problems  [ ]  clotting disorders  MUSCULOSKEL: [ ]  joint pain     [ ]  joint swelling  GASTROINTEST:  [ ]   blood in stool   [ ]   hematemesis  GENITOURINARY:   [ ]   dysuria    [ ]   hematuria  PSYCHIATRIC:   [ ]  history of major depression  INTEGUMENTARY: [x]  rashes    [ ]  ulcers  CONSTITUTIONAL:  [ ]  fever     [ ]  chills  Physical Examination  Filed Vitals:   12/29/11 1431 12/29/11 1432  BP: 114/70 124/61  Pulse: 66 66  Resp: 16   Height: 5\' 8"  (1.727 m)   Weight: 237 lb (107.502 kg)   SpO2: 96% 97%   Body mass index is 36.04 kg/(m^2).  General: A&O x 3, WDWN, obese  Head: Howard/AT  Ear/Nose/Throat: Hearing grossly intact, nares w/o erythema or drainage, oropharynx w/o Erythema/Exudate  Eyes: PERRLA, EOMI  Neck: Supple, no nuchal rigidity, no palpable LAD  Pulmonary: Sym exp, good air movt, CTAB, no rales, rhonchi, & wheezing  Cardiac: RRR, Nl S1, S2, no Murmurs, rubs or gallops  Vascular: Vessel Right Left  Radial Palpable Palpable  Brachial Palpable Palpable  Carotid Palpable, without bruit Palpable, without bruit  Aorta Non-palpable [name]  Femoral Palpable Palpable  Popliteal Non-palpable Non-palpable  PT Faintly Palpable Faintly Palpable  DP Faintly Palpable Faintly Palpable   Gastrointestinal: soft, NTND, -G/R, - HSM, - masses, - CVAT B, no palpable pulsatile mid-line mass  Musculoskeletal: M/S 5/5 throughout , Extremities without ischemic changes   Neurologic: CN 2-12 intact , Pain and light touch intact in extremities , Motor exam as listed above  Psychiatric: Judgment intact, Mood & affect appropriate for pt's clinical situation  Dermatologic: See M/S exam for extremity exam, no rashes otherwise noted  Lymph : No Cervical, Axillary, or Inguinal lymphadenopathy   Non-Invasive  Vascular  Imaging  CAROTID DUPLEX (Date: 12/29/11):   R ICA stenosis: occlude  R VA: antegrade with tardus parvus waveform  L ICA stenosis: 80-99%, 290/110 c/s  L VA: patent and antegrade  Outside Studies/Documentation 10 pages of outside documents were reviewed including: clinic work-up including clearance for surgery by Dr. Rollene Fare.  Medical Decision Making  [name] is a 66 y.o. 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   While the velocities in the L ICA may be reflect compensatory flow due to R ICA occlusion, he had increase in L ICA velocities despite having a known R ICA occlusion.  Subsequently, I would be concerned that the LICA stenosis is indeed > 80%.    Based on the patient's vascular studies and examination, I have offered the patient: L CEA. I discussed with the patient the risks, benefits, and alternatives to carotid endarterectomy.  I do not see a benefit to carotid artery stenting as the patient's cardiologist has cleared him for endarterectomy. I discussed the procedural details of carotid endarterectomy with the patient. The patient is aware that the risks of carotid endarterectomy include but are not limited to: bleeding, infection, stroke, myocardial infarction, death, cranial nerve injuries both temporary and permanent, neck hematoma, possible airway compromise, labile blood pressure post-operatively, cerebral hyperperfusion syndrome, and possible need for additional interventions in the future. The patient is aware of the risks and agrees to proceed forward with the procedure.  I discussed in depth with the patient the nature of atherosclerosis, and emphasized the importance of maximal medical management including strict control of blood pressure, blood glucose, and lipid levels, obtaining regular exercise, antiplatelet agents, and cessation of smoking.  The patient is aware that without maximal medical  management the underlying atherosclerotic disease process will progress, limiting the benefit of any interventions.  While the patient is admitted, we will also get a repeat CTA Abd/Pelvis to evaluate his AAA.  Thank you for allowing [place] to participate in this patient's care.  Adele Barthel, MD [facility] Office: [phone] Pager: [phone]  12/29/2011, 6:44 PM

## 2012-01-01 ENCOUNTER — Other Ambulatory Visit: Payer: Self-pay | Admitting: *Deleted

## 2012-01-03 ENCOUNTER — Encounter (HOSPITAL_COMMUNITY)
Admission: RE | Admit: 2012-01-03 | Discharge: 2012-01-03 | Disposition: A | Discharge status: Home / Self Care | Payer: Medicare Other | Source: Ambulatory Visit | Attending: Vascular Surgery | Admitting: Vascular Surgery

## 2012-01-03 ENCOUNTER — Encounter (HOSPITAL_COMMUNITY): Payer: Self-pay

## 2012-01-03 HISTORY — DX: Cardiac arrhythmia, unspecified: I49.9

## 2012-01-03 HISTORY — DX: Peripheral vascular disease, unspecified: I73.9

## 2012-01-03 HISTORY — DX: Occlusion and stenosis of unspecified carotid artery: I65.29

## 2012-01-03 LAB — URINALYSIS, ROUTINE W REFLEX MICROSCOPIC
Bilirubin Urine: NEGATIVE
Glucose, UA: NEGATIVE mg/dL
Hgb urine dipstick: NEGATIVE
Ketones, ur: NEGATIVE mg/dL
Leukocytes, UA: NEGATIVE
Nitrite: NEGATIVE
Protein, ur: NEGATIVE mg/dL
Specific Gravity, Urine: 1.014 (ref 1.005–1.030)
Urobilinogen, UA: 1 mg/dL (ref 0.0–1.0)
pH: 6 (ref 5.0–8.0)

## 2012-01-03 LAB — CBC
HCT: 36.3 % — ABNORMAL LOW (ref 39.0–52.0)
Hemoglobin: 11.2 g/dL — ABNORMAL LOW (ref 13.0–17.0)
MCH: 25.7 pg — ABNORMAL LOW (ref 26.0–34.0)
MCHC: 30.9 g/dL (ref 30.0–36.0)
MCV: 83.4 fL (ref 78.0–100.0)
Platelets: 156 10*3/uL (ref 150–400)
RBC: 4.35 MIL/uL (ref 4.22–5.81)
RDW: 14.5 % (ref 11.5–15.5)
WBC: 6.1 10*3/uL (ref 4.0–10.5)

## 2012-01-03 LAB — COMPREHENSIVE METABOLIC PANEL
ALT: 23 U/L (ref 0–53)
AST: 21 U/L (ref 0–37)
Albumin: 3.9 g/dL (ref 3.5–5.2)
Alkaline Phosphatase: 67 U/L (ref 39–117)
BUN: 18 mg/dL (ref 6–23)
CO2: 30 mEq/L (ref 19–32)
Calcium: 9.5 mg/dL (ref 8.4–10.5)
Chloride: 104 mEq/L (ref 96–112)
Creatinine, Ser: 0.72 mg/dL (ref 0.50–1.35)
GFR calc Af Amer: >90 mL/min (ref >90)
GFR calc non Af Amer: >90 mL/min (ref >90)
Glucose, Bld: 97 mg/dL (ref 70–99)
Potassium: 4.3 mEq/L (ref 3.5–5.1)
Sodium: 141 mEq/L (ref 135–145)
Total Bilirubin: 0.4 mg/dL (ref 0.3–1.2)
Total Protein: 7.1 g/dL (ref 6.0–8.3)

## 2012-01-03 LAB — SURGICAL PCR SCREEN
MRSA, PCR: NEGATIVE
Staphylococcus aureus: NEGATIVE

## 2012-01-03 LAB — DIFFERENTIAL
Basophils Absolute: 0 10*3/uL (ref 0.0–0.1)
Basophils Relative: 0 % (ref 0–1)
Eosinophils Absolute: 0.4 10*3/uL (ref 0.0–0.7)
Eosinophils Relative: 6 % — ABNORMAL HIGH (ref 0–5)
Lymphocytes Relative: 19 % (ref 12–46)
Lymphs Abs: 1.2 10*3/uL (ref 0.7–4.0)
Monocytes Absolute: 0.8 10*3/uL (ref 0.1–1.0)
Monocytes Relative: 14 % — ABNORMAL HIGH (ref 3–12)
Neutro Abs: 3.7 10*3/uL (ref 1.7–7.7)
Neutrophils Relative %: 61 % (ref 43–77)

## 2012-01-03 LAB — PROTIME-INR
INR: 1.02 (ref 0.00–1.49)
Prothrombin Time: 13.6 seconds (ref 11.6–15.2)

## 2012-01-03 LAB — APTT: aPTT: 29 seconds (ref 24–37)

## 2012-01-03 MED ORDER — VANCOMYCIN HCL 1000 MG IV SOLR
1500.0000 mg | INTRAVENOUS | Status: AC
  Administered 2012-01-04: 1500 mg via INTRAVENOUS
  Filled 2012-01-03: qty 1500

## 2012-01-03 MED ORDER — SODIUM CHLORIDE 0.9 % IV SOLN: INTRAVENOUS | Status: DC

## 2012-01-03 NOTE — Pre-Procedure Instructions (Signed)
[place]  01/03/2012   Your procedure is scheduled on: JAN 24 Report to [facility] at [facility].  Call this number if you have problems the morning of surgery: [phone]   Remember:   Do not eat food:After Midnight.  May have clear liquids: up to 4 Hours before arrival.  Clear liquids include soda, tea, black coffee, apple or grape juice, broth.  Take these medicines the morning of surgery with A SIP OF WATER:AMIODARONE,CARVEDILOL,IMDUR,TAMSULOSIN,COMBIVENT   Do not wear jewelry, make-up or nail polish.  Do not wear lotions, powders, or perfumes. You may wear deodorant.  Do not shave 48 hours prior to surgery.  Do not bring valuables to the hospital.  Contacts, dentures or bridgework may not be worn into surgery.  Leave suitcase in the car. After surgery it may be brought to your room.  For patients admitted to the hospital, checkout time is 11:00 AM the day of discharge.   Patients discharged the day of surgery will not be allowed to drive home.  Name and phone number of your driver: SPOUSE  Special Instructions: CHG Shower Use Special Wash: 1/2 bottle night before surgery and 1/2 bottle morning of surgery.   Please read over the following fact sheets that you were given: Coughing and Deep Breathing, Blood Transfusion Information, MRSA Information and Surgical Site Infection Prevention

## 2012-01-03 NOTE — Progress Notes (Signed)
FAXED ORDER FOR IMPLANTED DEVICE TO DR Rollene Fare [facility]

## 2012-01-04 ENCOUNTER — Other Ambulatory Visit: Payer: Self-pay | Admitting: Vascular Surgery

## 2012-01-04 ENCOUNTER — Encounter (HOSPITAL_COMMUNITY)
Admission: RE | Disposition: A | Discharge status: Home / Self Care | Payer: Self-pay | Source: Ambulatory Visit | Attending: Vascular Surgery

## 2012-01-04 ENCOUNTER — Ambulatory Visit (HOSPITAL_COMMUNITY): Payer: Medicare Other | Admitting: Critical Care Medicine

## 2012-01-04 ENCOUNTER — Encounter (HOSPITAL_COMMUNITY): Payer: Self-pay | Admitting: Critical Care Medicine

## 2012-01-04 ENCOUNTER — Inpatient Hospital Stay (HOSPITAL_COMMUNITY)
Admission: RE | Admit: 2012-01-04 | Discharge: 2012-01-05 | DRG: 038 | Disposition: A | Discharge status: Home / Self Care | Payer: Medicare Other | Source: Ambulatory Visit | Attending: Vascular Surgery | Admitting: Vascular Surgery

## 2012-01-04 DIAGNOSIS — G4730 Sleep apnea, unspecified: Secondary | ICD-10-CM | POA: Diagnosis present

## 2012-01-04 DIAGNOSIS — Z7902 Long term (current) use of antithrombotics/antiplatelets: Secondary | ICD-10-CM

## 2012-01-04 DIAGNOSIS — I658 Occlusion and stenosis of other precerebral arteries: Secondary | ICD-10-CM | POA: Diagnosis present

## 2012-01-04 DIAGNOSIS — E119 Type 2 diabetes mellitus without complications: Secondary | ICD-10-CM | POA: Diagnosis present

## 2012-01-04 DIAGNOSIS — I714 Abdominal aortic aneurysm, without rupture, unspecified: Secondary | ICD-10-CM | POA: Diagnosis present

## 2012-01-04 DIAGNOSIS — I6529 Occlusion and stenosis of unspecified carotid artery: Principal | ICD-10-CM | POA: Diagnosis present

## 2012-01-04 DIAGNOSIS — Z87891 Personal history of nicotine dependence: Secondary | ICD-10-CM

## 2012-01-04 DIAGNOSIS — J4489 Other specified chronic obstructive pulmonary disease: Secondary | ICD-10-CM | POA: Diagnosis present

## 2012-01-04 DIAGNOSIS — Z79899 Other long term (current) drug therapy: Secondary | ICD-10-CM

## 2012-01-04 DIAGNOSIS — I509 Heart failure, unspecified: Secondary | ICD-10-CM | POA: Diagnosis not present

## 2012-01-04 DIAGNOSIS — D62 Acute posthemorrhagic anemia: Secondary | ICD-10-CM | POA: Diagnosis not present

## 2012-01-04 DIAGNOSIS — J449 Chronic obstructive pulmonary disease, unspecified: Secondary | ICD-10-CM | POA: Diagnosis not present

## 2012-01-04 DIAGNOSIS — K219 Gastro-esophageal reflux disease without esophagitis: Secondary | ICD-10-CM | POA: Diagnosis present

## 2012-01-04 DIAGNOSIS — I252 Old myocardial infarction: Secondary | ICD-10-CM

## 2012-01-04 DIAGNOSIS — I10 Essential (primary) hypertension: Secondary | ICD-10-CM | POA: Diagnosis not present

## 2012-01-04 DIAGNOSIS — Z95810 Presence of automatic (implantable) cardiac defibrillator: Secondary | ICD-10-CM | POA: Diagnosis not present

## 2012-01-04 DIAGNOSIS — Z951 Presence of aortocoronary bypass graft: Secondary | ICD-10-CM | POA: Diagnosis not present

## 2012-01-04 HISTORY — PX: ENDARTERECTOMY: SHX5162

## 2012-01-04 HISTORY — PX: CAROTID ENDARTERECTOMY: SUR193

## 2012-01-04 LAB — CBC
HCT: 32.6 % — ABNORMAL LOW (ref 39.0–52.0)
Hemoglobin: 10.1 g/dL — ABNORMAL LOW (ref 13.0–17.0)
MCH: 25.8 pg — ABNORMAL LOW (ref 26.0–34.0)
MCHC: 31 g/dL (ref 30.0–36.0)
MCV: 83.2 fL (ref 78.0–100.0)
Platelets: 143 10*3/uL — ABNORMAL LOW (ref 150–400)
RBC: 3.92 MIL/uL — ABNORMAL LOW (ref 4.22–5.81)
RDW: 14.6 % (ref 11.5–15.5)
WBC: 9.8 10*3/uL (ref 4.0–10.5)

## 2012-01-04 LAB — CREATININE, SERUM
Creatinine, Ser: 0.66 mg/dL (ref 0.50–1.35)
GFR calc Af Amer: >90 mL/min (ref >90)
GFR calc non Af Amer: >90 mL/min (ref >90)

## 2012-01-04 SURGERY — ENDARTERECTOMY, CAROTID
Anesthesia: General | Site: Neck | Laterality: Left | Wound class: Clean

## 2012-01-04 MED ORDER — HYDRALAZINE HCL 20 MG/ML IJ SOLN
10.0000 mg | INTRAMUSCULAR | Status: DC | PRN
  Filled 2012-01-04: qty 0.5

## 2012-01-04 MED ORDER — ONDANSETRON HCL 4 MG/2ML IJ SOLN
4.0000 mg | Freq: Four times a day (QID) | INTRAMUSCULAR | Status: DC | PRN
  Administered 2012-01-04: 4 mg via INTRAVENOUS
  Filled 2012-01-04: qty 2

## 2012-01-04 MED ORDER — MEPERIDINE HCL 25 MG/ML IJ SOLN: 6.2500 mg | INTRAMUSCULAR | Status: DC | PRN

## 2012-01-04 MED ORDER — ACETAMINOPHEN 325 MG PO TABS: 325.0000 mg | ORAL_TABLET | ORAL | Status: DC | PRN

## 2012-01-04 MED ORDER — ISOSORBIDE MONONITRATE ER 60 MG PO TB24
60.0000 mg | ORAL_TABLET | Freq: Every day | ORAL | Status: DC
  Filled 2012-01-04: qty 1

## 2012-01-04 MED ORDER — PHENOL 1.4 % MT LIQD: 1.0000 | OROMUCOSAL | Status: DC | PRN

## 2012-01-04 MED ORDER — LABETALOL HCL 5 MG/ML IV SOLN: 10.0000 mg | INTRAVENOUS | Status: DC | PRN

## 2012-01-04 MED ORDER — POTASSIUM CHLORIDE CRYS ER 20 MEQ PO TBCR: 20.0000 meq | EXTENDED_RELEASE_TABLET | Freq: Once | ORAL | Status: AC | PRN

## 2012-01-04 MED ORDER — ASPIRIN 81 MG PO CHEW
81.0000 mg | CHEWABLE_TABLET | Freq: Every day | ORAL | Status: DC
Start: 2012-01-04 — End: 2012-01-05
  Administered 2012-01-04: 81 mg via ORAL
  Filled 2012-01-04 (×2): qty 1

## 2012-01-04 MED ORDER — 0.9 % SODIUM CHLORIDE (POUR BTL) OPTIME
TOPICAL | Status: DC | PRN
  Administered 2012-01-04: 2000 mL

## 2012-01-04 MED ORDER — THROMBIN 20000 UNITS EX KIT
PACK | OROMUCOSAL | Status: DC | PRN
  Administered 2012-01-04: 13:00:00 via TOPICAL

## 2012-01-04 MED ORDER — DEXTRAN 40 IN SALINE 10-0.9 % IV SOLN
25.0000 mL/h | INTRAVENOUS | Status: AC
  Administered 2012-01-04: .0001 mL
  Filled 2012-01-04: qty 500

## 2012-01-04 MED ORDER — MAGNESIUM SULFATE 40 MG/ML IJ SOLN: 2.0000 g | Freq: Once | INTRAMUSCULAR | Status: AC | PRN

## 2012-01-04 MED ORDER — DOCUSATE SODIUM 100 MG PO CAPS: 100.0000 mg | ORAL_CAPSULE | Freq: Every day | ORAL | Status: DC

## 2012-01-04 MED ORDER — NITROGLYCERIN 0.4 MG SL SUBL: 0.4000 mg | SUBLINGUAL_TABLET | SUBLINGUAL | Status: DC | PRN

## 2012-01-04 MED ORDER — PROPOFOL 10 MG/ML IV EMUL
INTRAVENOUS | Status: DC | PRN
  Administered 2012-01-04: 180 mg via INTRAVENOUS

## 2012-01-04 MED ORDER — LACTATED RINGERS IV SOLN
INTRAVENOUS | Status: DC | PRN
  Administered 2012-01-04 (×2): via INTRAVENOUS

## 2012-01-04 MED ORDER — PROTAMINE SULFATE 10 MG/ML IV SOLN
INTRAVENOUS | Status: DC | PRN
  Administered 2012-01-04: 30 mg via INTRAVENOUS

## 2012-01-04 MED ORDER — FUROSEMIDE 40 MG PO TABS
40.0000 mg | ORAL_TABLET | Freq: Every day | ORAL | Status: DC
  Administered 2012-01-04: 40 mg via ORAL
  Filled 2012-01-04 (×2): qty 1

## 2012-01-04 MED ORDER — EPHEDRINE SULFATE 50 MG/ML IJ SOLN
INTRAMUSCULAR | Status: DC | PRN
  Administered 2012-01-04 (×3): 5 mg via INTRAVENOUS

## 2012-01-04 MED ORDER — FINASTERIDE 5 MG PO TABS
5.0000 mg | ORAL_TABLET | Freq: Every day | ORAL | Status: DC
  Filled 2012-01-04 (×2): qty 1

## 2012-01-04 MED ORDER — CARVEDILOL 25 MG PO TABS
25.0000 mg | ORAL_TABLET | Freq: Two times a day (BID) | ORAL | Status: DC
  Administered 2012-01-04: 25 mg via ORAL
  Filled 2012-01-04 (×5): qty 1

## 2012-01-04 MED ORDER — METOCLOPRAMIDE HCL 5 MG/ML IJ SOLN
INTRAMUSCULAR | Status: DC | PRN
  Administered 2012-01-04: 10 mg via INTRAVENOUS

## 2012-01-04 MED ORDER — HYDROMORPHONE HCL PF 1 MG/ML IJ SOLN
INTRAMUSCULAR | Status: AC
  Administered 2012-01-04: 0.5 mg via INTRAVENOUS
  Filled 2012-01-04: qty 1

## 2012-01-04 MED ORDER — ACETAMINOPHEN 650 MG RE SUPP: 325.0000 mg | RECTAL | Status: DC | PRN

## 2012-01-04 MED ORDER — GUAIFENESIN-DM 100-10 MG/5ML PO SYRP: 15.0000 mL | ORAL_SOLUTION | ORAL | Status: DC | PRN

## 2012-01-04 MED ORDER — POLYETHYLENE GLYCOL 3350 17 G PO PACK
17.0000 g | PACK | Freq: Every day | ORAL | Status: DC | PRN
  Filled 2012-01-04: qty 1

## 2012-01-04 MED ORDER — ENOXAPARIN SODIUM 40 MG/0.4ML ~~LOC~~ SOLN
40.0000 mg | SUBCUTANEOUS | Status: DC
  Filled 2012-01-04 (×2): qty 0.4

## 2012-01-04 MED ORDER — SUCCINYLCHOLINE CHLORIDE 20 MG/ML IJ SOLN
INTRAMUSCULAR | Status: DC | PRN
  Administered 2012-01-04: 100 mg via INTRAVENOUS

## 2012-01-04 MED ORDER — SODIUM CHLORIDE 0.9 % IV SOLN
INTRAVENOUS | Status: DC
  Administered 2012-01-04: 75 mL/h via INTRAVENOUS

## 2012-01-04 MED ORDER — CLOPIDOGREL BISULFATE 75 MG PO TABS
75.0000 mg | ORAL_TABLET | Freq: Every day | ORAL | Status: DC
  Filled 2012-01-04 (×2): qty 1

## 2012-01-04 MED ORDER — FENTANYL CITRATE 0.05 MG/ML IJ SOLN
INTRAMUSCULAR | Status: DC | PRN
  Administered 2012-01-04: 50 ug via INTRAVENOUS
  Administered 2012-01-04: 100 ug via INTRAVENOUS
  Administered 2012-01-04 (×3): 50 ug via INTRAVENOUS

## 2012-01-04 MED ORDER — MORPHINE SULFATE 2 MG/ML IJ SOLN: 0.0500 mg/kg | INTRAMUSCULAR | Status: DC | PRN

## 2012-01-04 MED ORDER — IPRATROPIUM-ALBUTEROL 18-103 MCG/ACT IN AERO
2.0000 | INHALATION_SPRAY | Freq: Four times a day (QID) | RESPIRATORY_TRACT | Status: DC
  Administered 2012-01-04 – 2012-01-05 (×2): 2 via RESPIRATORY_TRACT
  Filled 2012-01-04 (×2): qty 14.7

## 2012-01-04 MED ORDER — PHENYLEPHRINE HCL 10 MG/ML IJ SOLN
10.0000 mg | INTRAVENOUS | Status: DC | PRN
  Administered 2012-01-04: 20 ug/min via INTRAVENOUS

## 2012-01-04 MED ORDER — AMIODARONE HCL 200 MG PO TABS
200.0000 mg | ORAL_TABLET | Freq: Every day | ORAL | Status: DC
  Filled 2012-01-04: qty 1

## 2012-01-04 MED ORDER — SIMVASTATIN 20 MG PO TABS
20.0000 mg | ORAL_TABLET | Freq: Every day | ORAL | Status: DC
  Administered 2012-01-04: 20 mg via ORAL
  Filled 2012-01-04 (×2): qty 1

## 2012-01-04 MED ORDER — NEOSTIGMINE METHYLSULFATE 1 MG/ML IJ SOLN
INTRAMUSCULAR | Status: DC | PRN
  Administered 2012-01-04: 4 mg via INTRAVENOUS

## 2012-01-04 MED ORDER — PHENYLEPHRINE HCL 10 MG/ML IJ SOLN
INTRAMUSCULAR | Status: DC | PRN
  Administered 2012-01-04: 80 ug via INTRAVENOUS

## 2012-01-04 MED ORDER — TAMSULOSIN HCL 0.4 MG PO CAPS
0.4000 mg | ORAL_CAPSULE | Freq: Every day | ORAL | Status: DC
  Filled 2012-01-04 (×2): qty 1

## 2012-01-04 MED ORDER — DEXAMETHASONE SODIUM PHOSPHATE 4 MG/ML IJ SOLN
INTRAMUSCULAR | Status: DC | PRN
  Administered 2012-01-04: 4 mg via INTRAVENOUS

## 2012-01-04 MED ORDER — ONDANSETRON HCL 4 MG/2ML IJ SOLN
4.0000 mg | Freq: Once | INTRAMUSCULAR | Status: AC | PRN
  Administered 2012-01-04: 4 mg via INTRAVENOUS

## 2012-01-04 MED ORDER — ONDANSETRON HCL 4 MG/2ML IJ SOLN
INTRAMUSCULAR | Status: DC | PRN
  Administered 2012-01-04 (×2): 4 mg via INTRAVENOUS

## 2012-01-04 MED ORDER — TRAMADOL HCL 50 MG PO TABS: 50.0000 mg | ORAL_TABLET | Freq: Four times a day (QID) | ORAL | Status: DC | PRN

## 2012-01-04 MED ORDER — OCUVITE-LUTEIN PO CAPS
1.0000 | ORAL_CAPSULE | Freq: Every day | ORAL | Status: DC
  Filled 2012-01-04 (×2): qty 1

## 2012-01-04 MED ORDER — SPIRONOLACTONE 25 MG PO TABS
25.0000 mg | ORAL_TABLET | Freq: Every day | ORAL | Status: DC
Start: 2012-01-04 — End: 2012-01-05
  Administered 2012-01-04: 25 mg via ORAL
  Filled 2012-01-04 (×2): qty 1

## 2012-01-04 MED ORDER — METOPROLOL TARTRATE 1 MG/ML IV SOLN: 2.0000 mg | INTRAVENOUS | Status: DC | PRN

## 2012-01-04 MED ORDER — ESMOLOL HCL 10 MG/ML IV SOLN
INTRAVENOUS | Status: DC | PRN
  Administered 2012-01-04: 10 mg via INTRAVENOUS
  Administered 2012-01-04 (×3): 20 mg via INTRAVENOUS
  Administered 2012-01-04: 10 mg via INTRAVENOUS

## 2012-01-04 MED ORDER — MORPHINE SULFATE 2 MG/ML IJ SOLN
2.0000 mg | INTRAMUSCULAR | Status: DC | PRN
  Administered 2012-01-04 – 2012-01-05 (×3): 2 mg via INTRAVENOUS
  Filled 2012-01-04 (×3): qty 1

## 2012-01-04 MED ORDER — GLYCOPYRROLATE 0.2 MG/ML IJ SOLN
INTRAMUSCULAR | Status: DC | PRN
  Administered 2012-01-04: .6 mg via INTRAVENOUS

## 2012-01-04 MED ORDER — SODIUM CHLORIDE 0.9 % IV SOLN: 500.0000 mL | Freq: Once | INTRAVENOUS | Status: AC | PRN

## 2012-01-04 MED ORDER — SODIUM CHLORIDE 0.9 % IR SOLN
Status: DC | PRN
  Administered 2012-01-04: 10:00:00

## 2012-01-04 MED ORDER — DOPAMINE-DEXTROSE 3.2-5 MG/ML-% IV SOLN: 3.0000 ug/kg/min | INTRAVENOUS | Status: DC

## 2012-01-04 MED ORDER — HYDROMORPHONE HCL PF 1 MG/ML IJ SOLN
0.2500 mg | INTRAMUSCULAR | Status: DC | PRN
  Administered 2012-01-04 (×4): 0.5 mg via INTRAVENOUS

## 2012-01-04 MED ORDER — LACTATED RINGERS IV SOLN
INTRAVENOUS | Status: DC
  Administered 2012-01-04: 09:00:00 via INTRAVENOUS

## 2012-01-04 MED ORDER — HEPARIN SODIUM (PORCINE) 1000 UNIT/ML IJ SOLN
INTRAMUSCULAR | Status: DC | PRN
  Administered 2012-01-04: 8000 [IU] via INTRAVENOUS

## 2012-01-04 MED ORDER — VECURONIUM BROMIDE 10 MG IV SOLR
INTRAVENOUS | Status: DC | PRN
  Administered 2012-01-04: 3 mg via INTRAVENOUS
  Administered 2012-01-04: 7 mg via INTRAVENOUS
  Administered 2012-01-04 (×2): 3 mg via INTRAVENOUS

## 2012-01-04 SURGICAL SUPPLY — 52 items
BAG DECANTER FOR FLEXI CONT (MISCELLANEOUS) ×2 IMPLANT
CANISTER SUCTION 2500CC (MISCELLANEOUS) ×2 IMPLANT
CATH ROBINSON RED A/P 18FR (CATHETERS) ×2 IMPLANT
CLIP TI MEDIUM 24 (CLIP) ×2 IMPLANT
CLIP TI WIDE RED SMALL 24 (CLIP) ×2 IMPLANT
CLOTH BEACON ORANGE TIMEOUT ST (SAFETY) ×2 IMPLANT
COVER PROBE W GEL 5X96 (DRAPES) ×2 IMPLANT
COVER SURGICAL LIGHT HANDLE (MISCELLANEOUS) ×4 IMPLANT
CRADLE DONUT ADULT HEAD (MISCELLANEOUS) ×2 IMPLANT
DERMABOND ADHESIVE PROPEN (GAUZE/BANDAGES/DRESSINGS) ×1
DERMABOND ADVANCED (GAUZE/BANDAGES/DRESSINGS) ×1
DERMABOND ADVANCED .7 DNX12 (GAUZE/BANDAGES/DRESSINGS) ×1 IMPLANT
DERMABOND ADVANCED .7 DNX6 (GAUZE/BANDAGES/DRESSINGS) ×1 IMPLANT
DRAPE WARM FLUID 44X44 (DRAPE) ×2 IMPLANT
ELECT REM PT RETURN 9FT ADLT (ELECTROSURGICAL) ×2
ELECTRODE REM PT RTRN 9FT ADLT (ELECTROSURGICAL) ×1 IMPLANT
GAUZE SPONGE 4X4 16PLY XRAY LF (GAUZE/BANDAGES/DRESSINGS) ×2 IMPLANT
GLOVE BIO SURGEON STRL SZ 6.5 (GLOVE) ×4 IMPLANT
GLOVE BIO SURGEON STRL SZ7 (GLOVE) ×4 IMPLANT
GLOVE BIOGEL PI IND STRL 6.5 (GLOVE) ×3 IMPLANT
GLOVE BIOGEL PI IND STRL 7.0 (GLOVE) ×2 IMPLANT
GLOVE BIOGEL PI IND STRL 7.5 (GLOVE) ×2 IMPLANT
GLOVE BIOGEL PI INDICATOR 6.5 (GLOVE) ×3
GLOVE BIOGEL PI INDICATOR 7.0 (GLOVE) ×2
GLOVE BIOGEL PI INDICATOR 7.5 (GLOVE) ×2
GLOVE ECLIPSE 6.5 STRL STRAW (GLOVE) ×4 IMPLANT
GLOVE SURG SS PI 6.0 STRL IVOR (GLOVE) ×2 IMPLANT
GOWN STRL NON-REIN LRG LVL3 (GOWN DISPOSABLE) ×12 IMPLANT
HEMOSTAT SURGICEL 2X14 (HEMOSTASIS) IMPLANT
KIT BASIN OR (CUSTOM PROCEDURE TRAY) ×2 IMPLANT
KIT ROOM TURNOVER OR (KITS) ×2 IMPLANT
NS IRRIG 1000ML POUR BTL (IV SOLUTION) ×4 IMPLANT
PACK CAROTID (CUSTOM PROCEDURE TRAY) ×2 IMPLANT
PAD ARMBOARD 7.5X6 YLW CONV (MISCELLANEOUS) ×4 IMPLANT
PATCH VASCULAR VASCU GUARD 1X6 (Vascular Products) ×2 IMPLANT
SET COLLECT BLD 21X3/4 12 (NEEDLE) IMPLANT
SHUNT CAROTID BYPASS 10 (VASCULAR PRODUCTS) ×2 IMPLANT
SHUNT CAROTID BYPASS 12FRX15.5 (VASCULAR PRODUCTS) IMPLANT
SPECIMEN JAR SMALL (MISCELLANEOUS) ×2 IMPLANT
SPONGE SURGIFOAM ABS GEL 100 (HEMOSTASIS) IMPLANT
SUT ETHILON 3 0 PS 1 (SUTURE) ×2 IMPLANT
SUT MNCRL AB 4-0 PS2 18 (SUTURE) ×2 IMPLANT
SUT PROLENE 6 0 BV (SUTURE) ×4 IMPLANT
SUT PROLENE 7 0 BV 1 (SUTURE) IMPLANT
SUT VIC AB 3-0 SH 27 (SUTURE) ×2
SUT VIC AB 3-0 SH 27X BRD (SUTURE) ×2 IMPLANT
SYR TB 1ML LUER SLIP (SYRINGE) IMPLANT
SYSTEM CHEST DRAIN TLS 7FR (DRAIN) ×2 IMPLANT
TOWEL OR 17X24 6PK STRL BLUE (TOWEL DISPOSABLE) ×2 IMPLANT
TOWEL OR 17X26 10 PK STRL BLUE (TOWEL DISPOSABLE) ×2 IMPLANT
TRAY FOLEY CATH 14FRSI W/METER (CATHETERS) ×2 IMPLANT
WATER STERILE IRR 1000ML POUR (IV SOLUTION) ×2 IMPLANT

## 2012-01-04 NOTE — Procedures (Unsigned)
CAROTID DUPLEX EXAM  INDICATION:  Carotid stenosis.  HISTORY: Diabetes:  Yes. Cardiac:  Yes. Hypertension:  Yes. Smoking:  No. Previous Surgery:  No. CV History:  Asymptomatic. Amaurosis Fugax No, Paresthesias No, Hemiparesis No.                                      RIGHT             LEFT Brachial systolic pressure:         122               135 Brachial Doppler waveforms:         Normal            Normal Vertebral direction of flow:        Antegrade         Antegrade DUPLEX VELOCITIES (cm/sec) CCA peak systolic                   44                97 ECA peak systolic                   188               [id number] ICA peak systolic                   Occluded          [id number] ICA end diastolic                   Occluded          110 PLAQUE MORPHOLOGY:                  Heterogenous      Mixed PLAQUE AMOUNT:                      Severe            Moderate/severe PLAQUE LOCATION:                    ICA, ECA          ICA, ECA  IMPRESSION: 1. Right occluded internal carotid artery. 2. Right external carotid artery stenosis. 3. Left external carotid artery stenosis. 4. Left internal carotid artery velocities suggest 80% to 99%     stenosis, which may be elevated due to compensatory flow. 5. Right vertebral waveforms is tardus/parvus.  ___________________________________________ Conrad [place], MD  EM/MEDQ  D:  12/29/2011  T:  12/29/2011  Job:  [id number]

## 2012-01-04 NOTE — Progress Notes (Signed)
Dr. Bridgett Larsson at bedside, aware about incision site more swollen. Will continue to monitor.

## 2012-01-04 NOTE — Progress Notes (Signed)
Paged representative from [employer] [phone] paged for ICD.  Has not returned page ,

## 2012-01-04 NOTE — Anesthesia Procedure Notes (Signed)
Procedure Name: Intubation Date/Time: 01/04/2012 9:40 AM Performed by: Carola Frost Pre-anesthesia Checklist: Emergency Drugs available, Patient identified, Timeout performed, Suction available and Patient being monitored Patient Re-evaluated:Patient Re-evaluated prior to inductionOxygen Delivery Method: Circle System Utilized Preoxygenation: Pre-oxygenation with 100% oxygen Intubation Type: IV induction and Cricoid Pressure applied Ventilation: Mask ventilation without difficulty and Oral airway inserted - appropriate to patient size Laryngoscope Size: Mac and 4 Grade View: Grade III Tube type: Oral Tube size: 7.5 mm Number of attempts: 1 Airway Equipment and Method: stylet Placement Confirmation: ETT inserted through vocal cords under direct vision,  positive ETCO2 and breath sounds checked- equal and bilateral Secured at: 23 cm Tube secured with: Tape Dental Injury: Teeth and Oropharynx as per pre-operative assessment

## 2012-01-04 NOTE — Anesthesia Postprocedure Evaluation (Signed)
  Anesthesia Post-op Note  Patient: [name]  Procedure(s) Performed:  ENDARTERECTOMY CAROTID - with patch angioplasty  Patient Location: PACU  Anesthesia Type: General  Level of Consciousness: awake  Airway and Oxygen Therapy: Patient Spontanous Breathing and Patient connected to nasal cannula oxygen  Post-op Pain: mild  Post-op Assessment: Post-op Vital signs reviewed, Patient's Cardiovascular Status Stable, Respiratory Function Stable, Patent Airway, No signs of Nausea or vomiting and Pain level controlled  Post-op Vital Signs: Reviewed and stable  Complications: No apparent anesthesia complications

## 2012-01-04 NOTE — H&P (View-Only) (Signed)
[facility]  New Carotid Patient  Referred by:  Rebecca Eaton, MD [address] 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 male who presents with chief complaint: "abnormal neck studies".  Previous carotid studies demonstrated: RICA occlusion, LICA [id number] stenosis.  Patient has no history of TIA or stroke symptom.  The patient has never had amaurosis fugax or monocular blindness.  The patient has never had facial drooping or hemiplegia.  The patient has never had receptive or expressive aphasia.   The patient's previous neurologic deficits have never resolved.  The patient's risks factors for carotid disease include: DM, HTN, and h/o smoking.  Additionally, the patient has a known history of AAA.  He denies abd or back pain.  His risk factors for AAA included: male, age, and former smoking.  Past Medical History  Diagnosis Date  . Myocardial infarction   . Hypertension   . CHF (congestive heart failure)   . COPD (chronic obstructive pulmonary disease)   . Diabetes mellitus   . GERD (gastroesophageal reflux disease)   . AAA (abdominal aortic aneurysm)   . Tremor   . Sleep apnea     Past Surgical History  Procedure Date  . Coronary artery bypass graft     History   Social History  . Marital Status: Married    Spouse Name: [name]    Number of Children: [name]  . Years of Education: [name]   Occupational History  . Not on file.   Social History Main Topics  . Smoking status: Former Smoker    Quit date: 12/11/2002  . Smokeless tobacco: Not on file  . Alcohol Use: 0.5 oz/week    1 drink(s) per week  . Drug Use: No  . Sexually Active: Not on file   Other Topics Concern  . Not on file   Social History Narrative  . No narrative on file    Family History  Problem Relation Age of Onset  . Colon cancer Mother   .  Diabetes Sister   . Heart disease Sister   . Heart disease Brother   . Lung cancer Maternal Grandmother     Current Outpatient Prescriptions on File Prior to Visit  Medication Sig Dispense Refill  . amiodarone (CORDARONE) 200 MG tablet Take 200 mg by mouth daily.       Marland Kitchen atorvastatin (LIPITOR) 40 MG tablet Take 40 mg by mouth daily.        . carvedilol (COREG) 25 MG tablet Take 25 mg by mouth 2 (two) times daily with a meal.        . clopidogrel (PLAVIX) 75 MG tablet Take 75 mg by mouth daily.        . Cranberry 300 MG tablet Take 300 mg by mouth daily.        . fish oil-omega-3 fatty acids 1000 MG capsule Take 2 g by mouth daily.        . furosemide (LASIX) 40 MG tablet Take 40 mg by mouth daily.        Marland Kitchen glucose blood (ONE TOUCH TEST STRIPS) test strip 1 each by Other route as needed. Use as instructed       . Ipratropium-Albuterol (COMBIVENT IN) Inhale 2 puffs into the lungs daily as needed. For shortness of breath      .  isosorbide mononitrate (IMDUR) 60 MG 24 hr tablet Take 60 mg by mouth daily. Take 1 tablet every am and 0.5 tablet at bedtime      . multivitamin-lutein (OCUVITE-LUTEIN) CAPS Take 1 capsule by mouth daily.        . nitroGLYCERIN (NITROSTAT) 0.4 MG SL tablet Place 0.4 mg under the tongue every 5 (five) minutes as needed.        . ONE TOUCH LANCETS MISC by Does not apply route daily.        Marland Kitchen spironolactone (ALDACTONE) 25 MG tablet Take 25 mg by mouth daily.          Allergies  Allergen Reactions  . Ace Inhibitors     REACTION: cough  . Penicillins Other (See Comments)    "childhood allergy"  . Tussionex Pennkinetic Er (Hydrocod Polst-Cpm Polst) Other (See Comments)    "caused prostate problems"    REVIEW OF SYSTEMS:  (Positives checked otherwise negative)  CARDIOVASCULAR: [ ]  chest pain    [ ]  chest pressure    [ ]  palpitations    [ ]  orthopnea   [ ]  dyspnea on exert. [ ]  claudication    [ ]  rest pain     [ ]  DVT     [ ]  phlebitis  PULMONARY:    [ ]   productive cough [ ]  asthma  [ ]  wheezing  NEUROLOGIC:    [ ]  weakness    [ ]  paresthesias   [ ]  aphasia    [ ]  amaurosis    [ ]  dizziness  HEMATOLOGIC:    [ ]  bleeding problems  [ ]  clotting disorders  MUSCULOSKEL: [ ]  joint pain     [ ]  joint swelling  GASTROINTEST:  [ ]   blood in stool   [ ]   hematemesis  GENITOURINARY:   [ ]   dysuria    [ ]   hematuria  PSYCHIATRIC:   [ ]  history of major depression  INTEGUMENTARY: [x]  rashes    [ ]  ulcers  CONSTITUTIONAL:  [ ]  fever     [ ]  chills  Physical Examination  Filed Vitals:   12/29/11 1431 12/29/11 1432  BP: 114/70 124/61  Pulse: 66 66  Resp: 16   Height: 5\' 8"  (1.727 m)   Weight: 237 lb (107.502 kg)   SpO2: 96% 97%   Body mass index is 36.04 kg/(m^2).  General: A&O x 3, WDWN, obese  Head: [place]/AT  Ear/Nose/Throat: Hearing grossly intact, nares w/o erythema or drainage, oropharynx w/o Erythema/Exudate  Eyes: PERRLA, EOMI  Neck: Supple, no nuchal rigidity, no palpable LAD  Pulmonary: Sym exp, good air movt, CTAB, no rales, rhonchi, & wheezing  Cardiac: RRR, Nl S1, S2, no Murmurs, rubs or gallops  Vascular: Vessel Right Left  Radial Palpable Palpable  Brachial Palpable Palpable  Carotid Palpable, without bruit Palpable, without bruit  Aorta Non-palpable [name]  Femoral Palpable Palpable  Popliteal Non-palpable Non-palpable  PT Faintly Palpable Faintly Palpable  DP Faintly Palpable Faintly Palpable   Gastrointestinal: soft, NTND, -G/R, - HSM, - masses, - CVAT B, no palpable pulsatile mid-line mass  Musculoskeletal: M/S 5/5 throughout , Extremities without ischemic changes   Neurologic: CN 2-12 intact , Pain and light touch intact in extremities , Motor exam as listed above  Psychiatric: Judgment intact, Mood & affect appropriate for pt's clinical situation  Dermatologic: See M/S exam for extremity exam, no rashes otherwise noted  Lymph : No Cervical, Axillary, or Inguinal lymphadenopathy   Non-Invasive  Vascular  Imaging  CAROTID DUPLEX (Date: 12/29/11):   R ICA stenosis: occlude  R VA: antegrade with tardus parvus waveform  L ICA stenosis: 80-99%, 290/110 c/s  L VA: patent and antegrade  Outside Studies/Documentation 10 pages of outside documents were reviewed including: clinic work-up including clearance for surgery by Dr. Rollene Fare.  Medical Decision Making  [name] is a 66 y.o. 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   While the velocities in the L ICA may be reflect compensatory flow due to R ICA occlusion, he had increase in L ICA velocities despite having a known R ICA occlusion.  Subsequently, I would be concerned that the LICA stenosis is indeed > 80%.    Based on the patient's vascular studies and examination, I have offered the patient: L CEA. I discussed with the patient the risks, benefits, and alternatives to carotid endarterectomy.  I do not see a benefit to carotid artery stenting as the patient's cardiologist has cleared him for endarterectomy. I discussed the procedural details of carotid endarterectomy with the patient. The patient is aware that the risks of carotid endarterectomy include but are not limited to: bleeding, infection, stroke, myocardial infarction, death, cranial nerve injuries both temporary and permanent, neck hematoma, possible airway compromise, labile blood pressure post-operatively, cerebral hyperperfusion syndrome, and possible need for additional interventions in the future. The patient is aware of the risks and agrees to proceed forward with the procedure.  I discussed in depth with the patient the nature of atherosclerosis, and emphasized the importance of maximal medical management including strict control of blood pressure, blood glucose, and lipid levels, obtaining regular exercise, antiplatelet agents, and cessation of smoking.  The patient is aware that without maximal medical  management the underlying atherosclerotic disease process will progress, limiting the benefit of any interventions.  While the patient is admitted, we will also get a repeat CTA Abd/Pelvis to evaluate his AAA.  Thank you for allowing [place] to participate in this patient's care.  Adele Barthel, MD [facility] Office: [phone] Pager: [phone]  12/29/2011, 6:44 PM

## 2012-01-04 NOTE — Preoperative (Signed)
Beta Blockers   Reason not to administer Beta Blockers:Not Applicable 

## 2012-01-04 NOTE — Transfer of Care (Signed)
Immediate Anesthesia Transfer of Care Note  Patient: [name]  Procedure(s) Performed:  ENDARTERECTOMY CAROTID - with patch angioplasty  Patient Location: PACU  Anesthesia Type: General  Level of Consciousness: awake, alert  and oriented  Airway & Oxygen Therapy: Patient Spontanous Breathing and Patient connected to nasal cannula oxygen  Post-op Assessment: Report given to PACU RN, Post -op Vital signs reviewed and stable and Patient moving all extremities X 4  Post vital signs: Reviewed and stable  Complications: No apparent anesthesia complications

## 2012-01-04 NOTE — Progress Notes (Signed)
[name] from [employer] returned page and stated to call him whe pt in holding area.

## 2012-01-04 NOTE — Anesthesia Preprocedure Evaluation (Addendum)
Anesthesia Evaluation  Patient identified by MRN, date of birth, ID band Patient awake    Reviewed: Allergy & Precautions, H&P , NPO status , Patient's Chart, lab work & pertinent test results, reviewed documented beta blocker date and time   Airway Mallampati: II TM Distance: >3 FB Neck ROM: Full    Dental  (+) Teeth Intact and Dental Advisory Given   Pulmonary sleep apnea and Continuous Positive Airway Pressure Ventilation , COPD COPD inhaler, former smoker clear to auscultation        Cardiovascular hypertension, Pt. on home beta blockers and Pt. on medications + CAD, + Past MI and +CHF + dysrhythmias + Cardiac Defibrillator Regular Normal    Neuro/Psych    GI/Hepatic GERD-  Controlled,  Endo/Other  Diabetes mellitus-, Well Controlled  Renal/GU      Musculoskeletal   Abdominal   Peds  Hematology   Anesthesia Other Findings   Reproductive/Obstetrics                          Anesthesia Physical Anesthesia Plan  ASA: IV  Anesthesia Plan: General   Post-op Pain Management:    Induction: Intravenous  Airway Management Planned: Oral ETT  Additional Equipment: Arterial line  Intra-op Plan:   Post-operative Plan: Extubation in OR  Informed Consent: I have reviewed the patients History and Physical, chart, labs and discussed the procedure including the risks, benefits and alternatives for the proposed anesthesia with the patient or authorized representative who has indicated his/her understanding and acceptance.   Dental advisory given  Plan Discussed with: CRNA, Anesthesiologist and Surgeon  Anesthesia Plan Comments:         Anesthesia Quick Evaluation

## 2012-01-04 NOTE — OR Nursing (Signed)
Patient displayed equal, bilateral strength in hands and feet, tongue midline; pre-op and post-op.

## 2012-01-04 NOTE — Op Note (Signed)
OPERATIVE NOTE  PROCEDURE:   1.  Left carotid endarterectomy with bovine patch angioplasty  PRE-OPERATIVE DIAGNOSIS: left high grade asymptomatic carotid stenosis >80%  POST-OPERATIVE DIAGNOSIS: same as above   SURGEON: Adele Barthel, MD  ASSISTANT(S): Baker Janus, PAC  ANESTHESIA: general  ESTIMATED BLOOD LOSS: 30 cc  FINDING(S): 1.  Continuous Doppler audible flow signatures are appropriate for each carotid artery. 2.  No evidence of intimal flap visualized in common carotid artery and external carotid artery on transverse or longitudinal ultrasonography.  Internal carotid artery could not be visualized as it was rotated posteriorly. 3.  >80% stenosis in bifurcation extending into internal carotid artery  SPECIMEN(S):  Carotid plaque (sent to Pathology)  INDICATIONS:   [name] is a 66 y.o. male who  presents with left high grade asymptomatic carotid stenosis 80-99% and right internal carotid occlusion.  I discussed with the patient the risks, benefits, and alternatives to carotid endarterectomy.  I discussed the procedural details of carotid endarterectomy with the patient.  The patient is aware that the risks of carotid endarterectomy include but are not limited to: bleeding, infection, stroke, myocardial infarction, death, cranial nerve injuries both temporary and permanent, neck hematoma, possible airway compromise, labile blood pressure post-operatively, cerebral hyperperfusion syndrome, and possible need for additional interventions in the future. The patient is aware of the risks and agrees to proceed forward with the procedure.  DESCRIPTION: After full informed written consent was obtained from the patient, the patient was brought back to the operating room and placed supine upon the operating table.  Prior to induction, the patient received IV antibiotics.  After obtaining adequate anesthesia, the patient was placed into semi-Fowler position with a shoulder roll in  place and the patient's neck slightly hyperextended and rotated away from the surgical site.  The patient was prepped in the standard fashion for a left carotid endarterectomy.  I made an incision anterior to the sternocleidomastoid muscle and dissected down through the subcutaneous tissue.  The platysmas was opened with electrocautery.  Then I dissected down to the internal jugular vein.  This was dissected posteriorly until I obtained visualization of the common carotid artery.  This was dissected out and then an umbilical tape was placed around the common carotid artery and I loosely applied a Rumel tourniquet.  I then dissected in a periadventitial fashion along the common carotid artery up to the bifurcation.  I then identified the external carotid artery and the superior thyroid artery.  A 2-0 silk tie was looped around the superior thyroid artery, and I also dissected out the external carotid artery and placed a vessel loop around it.  In continuing the dissection to the internal carotid artery, I identified the facial vein.  This was ligated and then transected, giving me improved exposure of the internal carotid artery.  In the process of this dissection, the hypoglossal nerve was identified.  I then dissected out the internal carotid artery until I identified an area of soft tissue in the internal carotid artery.  I dissected slightly distal to this area, and placed an umbilical tape around the artery and loosely applied a Rumel tourniquet.  At this point, we gave the patient a therapeutic bolus of Heparin intravenously (roughly 80 units/kg).  After waiting 3 minutes, then I clamped the internal carotid artery, external carotid artery and then the common carotid artery.  I then made an arteriotomy in the common carotid artery with a 11 blade, and extended the arteriotomy with a Potts scissor  down into the common carotid artery, then I carried the arteriotomy through the bifurcation into the internal  carotid artery until I reached an area that was not diseased.  At this point, I took the 10 shunt that previously been prepared and I inserted it into the internal carotid artery.  The Rumel tourniquet was then applied to this end of the shunt.  I unclamped the shunt to verify retrograde blood flow in the internal carotid artery.  I then placed the other end of the shunt into the common carotid artery after unclamping the artery.  The Rumel was tightened down around the shunt.  At this point, I verified blood flow in the shunt with a continuous doppler.  At this point, I started the endarterectomy in the common carotid artery with a Technical brewer and carried this dissection down into the common carotid artery circumferentially.  Then I transected the plaque at a segment where it was adherent.  I then carried this dissection up into the external carotid artery.  The plaque was extracted by unclamping the external carotid artery and everting the artery.  The dissection was then carried into the internal carotid artery, extracting the remaining portion of the carotid plaque.  I passed the plaque off the field as a specimen.  I then spent the next 30 minutes removing intimal flaps and loose debris.  Eventually I reached the point where the residual plaque was densely adherent and any further dissection would compromise the integrity of the wall.  Note after removing the plaque, the wall was uniformly quite attenuated.  One area in the posterior wall appeared to be particularly thin but completely intact.    After verifying that there was no more loose intimal flaps or debris, I re-interrogated the entirety of this carotid artery.  At this point, I was satisfied that the minimal remaining disease was densely adherent to the wall and wall integrity was intact.  At this point, I then fashioned a bovine pericardial patch for the geometry of this artery and sewed it in place with two running stitch of 6-0 Prolene, one  from each end.  Prior to completing this patch angioplasty, I removed the shunt first from the internal carotid artery, from which there was excellent backbleeding, and clamped it.  Then I removed the shunt from the common carotid artery, from which there was excellent antegrade bleeding, and then clamped it.  At this point, I allowed the external carotid artery to backbleed, which was excellent.  Then I instilled heparinized saline in this patched artery and then completed the patch angioplasty in the usual fashion.  First, I released the clamp on the external carotid artery, then I released it on the common carotid artery.  After waiting a few seconds, I then released it on the internal carotid artery.  I then interrogated this patient's arteries with the continuous Doppler.  The audible waveforms in each artery were consistent with the expected characteristics for each artery.  The Sonosite probe was then sterilely draped and used to interrogate the carotid artery in both longitudinal and transverse views.  Unfortunately, I could not easily get the internal carotid artery into the field as it was rotated posteriorly.   At this point, I washed out the wound, and placed thrombin and Gelfoam throughout.  I also gave the patient 30 mg of protamine to reverse his anticoagulation.   After waiting a few minutes, I removed the thrombin and Gelfoam and washed out the wound.  There  was no more active bleeding in the surgical site.  The area of posterior wall of concern appeared to be intact without any obvious aneurysmal bulge.   I then reapproximated the platysma muscle with a running stitch of 3-0 Vicryl.  The skin was then reapproximated with a running subcuticular 4-0 Monocryl stitch.  The skin was then cleaned, dried and Dermabond was used to reinforce the skin closure.  The patient woke without any problems, neurologically intact.     COMPLICATIONS: none  CONDITION: stable  Adele Barthel, MD [facility] Office: [phone] Pager: [phone]  01/04/2012, 1:03 PM

## 2012-01-04 NOTE — Interval H&P Note (Signed)
--    [facility]  History and Physical Update  The patient was interviewed and re-examined.  The patient's History and Physical has been reviewed and is unchanged.  There is no change in the plan of care.  Adele Barthel, MD [facility] Office: [phone] Pager: [phone]  01/04/2012, 8:17 AM

## 2012-01-05 ENCOUNTER — Other Ambulatory Visit: Payer: Self-pay | Admitting: *Deleted

## 2012-01-05 ENCOUNTER — Encounter (HOSPITAL_COMMUNITY): Payer: Self-pay | Admitting: Vascular Surgery

## 2012-01-05 DIAGNOSIS — I714 Abdominal aortic aneurysm, without rupture: Secondary | ICD-10-CM

## 2012-01-05 LAB — CBC
HCT: 33.3 % — ABNORMAL LOW (ref 39.0–52.0)
Hemoglobin: 10.3 g/dL — ABNORMAL LOW (ref 13.0–17.0)
MCH: 25.6 pg — ABNORMAL LOW (ref 26.0–34.0)
MCHC: 30.9 g/dL (ref 30.0–36.0)
MCV: 82.8 fL (ref 78.0–100.0)
Platelets: 141 10*3/uL — ABNORMAL LOW (ref 150–400)
RBC: 4.02 MIL/uL — ABNORMAL LOW (ref 4.22–5.81)
RDW: 14.5 % (ref 11.5–15.5)
WBC: 9.5 10*3/uL (ref 4.0–10.5)

## 2012-01-05 LAB — TYPE AND SCREEN
ABO/RH(D): A POS
Antibody Screen: POSITIVE
DAT, IgG: NEGATIVE
Donor AG Type: NEGATIVE
Donor AG Type: NEGATIVE
Unit division: 0
Unit division: 0

## 2012-01-05 LAB — BASIC METABOLIC PANEL
BUN: 14 mg/dL (ref 6–23)
CO2: 26 mEq/L (ref 19–32)
Calcium: 8.2 mg/dL — ABNORMAL LOW (ref 8.4–10.5)
Chloride: 104 mEq/L (ref 96–112)
Creatinine, Ser: 0.61 mg/dL (ref 0.50–1.35)
GFR calc Af Amer: >90 mL/min (ref >90)
GFR calc non Af Amer: >90 mL/min (ref >90)
Glucose, Bld: 113 mg/dL — ABNORMAL HIGH (ref 70–99)
Potassium: 4.2 mEq/L (ref 3.5–5.1)
Sodium: 138 mEq/L (ref 135–145)

## 2012-01-05 MED ORDER — OXYCODONE HCL 5 MG PO TABS: 5.0000 mg | ORAL_TABLET | ORAL | Status: AC | PRN

## 2012-01-05 NOTE — Progress Notes (Signed)
Pt VSS, d/c instructions given, prescription given, pt refused RN to obtain complete assessment also refused medication for am. All questions answered, pt verbalized understanding of D/C instructions.

## 2012-01-05 NOTE — Plan of Care (Signed)
Problem: Consults Goal: Diagnosis CEA/CES/AAA Stent Carotid Endarterectomy (CEA)  Problem: Phase I Progression Outcomes Goal: Vascular site scale level 0 - I Vascular Site Scale Level 0: No bruising/bleeding/hematoma Level I (Mild): Bruising/Ecchymosis, minimal bleeding/ooozing, palpable hematoma < 3 cm Level II (Moderate): Bleeding not affecting hemodynamic parameters, pseudoaneurysm, palpable hematoma > 3 cm  Outcome: Completed/Met Date Met:  01/05/12 Level 1

## 2012-01-05 NOTE — Progress Notes (Signed)
[facility] Progress Note  01/05/2012 7:23 AM POD 1  Subjective:  No complaints this am.  States swelling is better than it was yesterday. No problems swallowing this am.  Afebrile x 24hrs  100s-150s sys  91%RA  HR  60-70s reg Filed Vitals:   01/05/12 0400  BP:   Pulse:   Temp: 97.9 F (36.6 C)  Resp:      Physical Exam: Neuro:  In tact.  No deficits. Incision:  C/d/i with increased ecchymosis (pt on pre-op plavix and ASA)  CBC    Component Value Date/Time   WBC 9.5 01/05/2012 0420   RBC 4.02* 01/05/2012 0420   HGB 10.3* 01/05/2012 0420   HCT 33.3* 01/05/2012 0420   PLT 141* 01/05/2012 0420   MCV 82.8 01/05/2012 0420   MCH 25.6* 01/05/2012 0420   MCHC 30.9 01/05/2012 0420   RDW 14.5 01/05/2012 0420   LYMPHSABS 1.2 01/03/2012 1517   MONOABS 0.8 01/03/2012 1517   EOSABS 0.4 01/03/2012 1517   BASOSABS 0.0 01/03/2012 1517    BMET    Component Value Date/Time   NA 138 01/05/2012 0420   K 4.2 01/05/2012 0420   CL 104 01/05/2012 0420   CO2 26 01/05/2012 0420   GLUCOSE 113* 01/05/2012 0420   BUN 14 01/05/2012 0420   CREATININE 0.61 01/05/2012 0420   CALCIUM 8.2* 01/05/2012 0420   GFRNONAA >90 01/05/2012 0420   GFRAA >90 01/05/2012 0420       Assessment/Plan:  This is a 66 y.o. male who is s/p left CEA POD 1 -acute surgical blood loss anemia--tolerating well. -foley out this am and pt has voided. -needs to ambulate. -drain with only 30cc out.  D/c drain. -d/c pt later this am. -needs AAA repair--will see the pt back in 2 weeks post op and make a plan for his AAA repair at that time.  Evorn Gong, PA-C [facility] [phone]

## 2012-01-05 NOTE — Discharge Summary (Signed)
[facility] Discharge Summary  [name] 04-07-1946 66 y.o. male  [id number]  Admission Date: 01/04/2012  Discharge Date: 01/05/12  Physician: Hinda Lenis, MD  Admission Diagnosis: Left ICA stenosis   HPI:   This is a 66 y.o. male who presents with chief complaint: "abnormal neck studies". Previous carotid studies demonstrated: RICA occlusion, LICA [id number] stenosis. Patient has no history of TIA or stroke symptom. The patient has never had amaurosis fugax or monocular blindness. The patient has never had facial drooping or hemiplegia. The patient has never had receptive or expressive aphasia. The patient's previous neurologic deficits have never resolved. The patient's risks factors for carotid disease include: DM, HTN, and h/o smoking. Additionally, the patient has a known history of AAA. He denies abd or back pain. His risk factors for AAA included: male, age, and former smoking.   Hospital Course:  The patient was admitted to the hospital and taken to the operating room on 01/04/2012 and underwent left CEA.  The pt tolerated the procedure well and was transported to the PACU in good condition.  The afternoon of surgery, he did have some swelling of his neck, which a drain was in place d/t the fact the pt is on Plavix and ASA.  By POD 1, the swelling in his neck was somewhat better, but he did have increased ecchymosis.  His neurological exam was in tact without deficit. The remainder of the hospital course consisted of increasing ambulation and increasing intake of solids without difficulty.    Basename 01/05/12 0420 01/03/12 1517  NA 138 141  K 4.2 4.3  CL 104 104  CO2 26 30  GLUCOSE 113* 97  BUN 14 18  CALCIUM 8.2* 9.5    Basename 01/05/12 0420 01/04/12 1600  WBC 9.5 9.8  HGB 10.3* 10.1*  HCT 33.3* 32.6*  PLT 141* 143*    Basename 01/03/12 1517  INR 1.02     Discharge Instructions:   The patient is discharged to home with extensive  instructions on wound care and progressive ambulation.  They are instructed not to drive or perform any heavy lifting until returning to see the physician in his office.  Discharge Orders    Future Orders Please Complete By Expires   Resume previous diet      Driving Restrictions      Comments:   No driving for 2 weeks   Lifting restrictions      Comments:   No lifting for 6 weeks   Call MD for:  temperature >100.5      Call MD for:  redness, tenderness, or signs of infection (pain, swelling, bleeding, redness, odor or green/yellow discharge around incision site)      Call MD for:  severe or increased pain, loss or decreased feeling  in affected limb(s)      CAROTID Sugery: Call MD for difficulty swallowing or speaking; weakness in arms or legs that is a new symtom; severe headache.  If you have increased swelling in the neck and/or  are having difficulty breathing, CALL [phone]         Discharge Diagnosis:  Left ICA stenosis  Secondary Diagnosis: Patient Active Problem List  Diagnoses  . HYPERTENSION  . CAD  . COPD, MILD  . GERD  . HYPERTROPHY PROSTATE W/UR OBST & OTH LUTS  . BACK PAIN  . OTHER DYSPHAGIA  . HYPERGLYCEMIA, BORDERLINE  . PSA, INCREASED  . COLONIC POLYPS, HX OF  . SINUSITIS- ACUTE-NOS  .  Bronchitis  . Occlusion and stenosis of carotid artery without mention of cerebral infarction  . Abdominal aneurysm without mention of rupture  . Carotid artery stenosis and occlusion   Past Medical History  Diagnosis Date  . Myocardial infarction   . Hypertension   . CHF (congestive heart failure)   . COPD (chronic obstructive pulmonary disease)   . GERD (gastroesophageal reflux disease)   . AAA (abdominal aortic aneurysm)   . Tremor   . Sleep apnea   . Dysrhythmia     ICD  . Diabetes mellitus     DIET CONTROLLED  . Carotid artery stenosis   . Peripheral vascular disease        [name], [name]  Home Medication Instructions [id number]   Printed on:01/05/12  [id number]  Medication Information                    carvedilol (COREG) 25 MG tablet Take 25 mg by mouth 2 (two) times daily with a meal.             clopidogrel (PLAVIX) 75 MG tablet Take 75 mg by mouth daily.             furosemide (LASIX) 40 MG tablet Take 40 mg by mouth daily.             isosorbide mononitrate (IMDUR) 60 MG 24 hr tablet Take 60 mg by mouth daily. Take 1 tablet every am and 0.5 tablet at bedtime           nitroGLYCERIN (NITROSTAT) 0.4 MG SL tablet Place 0.4 mg under the tongue every 5 (five) minutes as needed.             spironolactone (ALDACTONE) 25 MG tablet Take 25 mg by mouth daily.             atorvastatin (LIPITOR) 40 MG tablet Take 40 mg by mouth daily.             amiodarone (CORDARONE) 200 MG tablet Take 200 mg by mouth daily.            multivitamin-lutein (OCUVITE-LUTEIN) CAPS Take 1 capsule by mouth daily.             Cranberry 300 MG tablet Take 300 mg by mouth daily.             fish oil-omega-3 fatty acids 1000 MG capsule Take 2 g by mouth daily.             Ipratropium-Albuterol (COMBIVENT IN) Inhale 2 puffs into the lungs daily as needed. For shortness of breath           ONE TOUCH LANCETS MISC by Does not apply route daily.             glucose blood (ONE TOUCH TEST STRIPS) test strip 1 each by Other route as needed. Use as instructed            aspirin 81 MG tablet Take 81 mg by mouth daily.           ramipril (ALTACE) 2.5 MG tablet Take 2.5-5 mg by mouth daily. Take 2 capsule every morning Take 1 capsule every evening           cholecalciferol (VITAMIN D) 1000 UNITS tablet Take 1,000 Units by mouth daily.           CALCIUM PO Take 1 tablet by mouth daily.           finasteride (  PROSCAR) 5 MG tablet Take 5 mg by mouth daily.           Tamsulosin HCl (FLOMAX) 0.4 MG CAPS Take 0.4 mg by mouth daily.           COMBIVENT 18-103 MCG/ACT inhaler            ramipril (ALTACE) 2.5 MG capsule            oxyCODONE  (ROXICODONE) 5 MG immediate release tablet Take 1 tablet (5 mg total) by mouth every 4 (four) hours as needed for pain.             Disposition: home    Patient's condition: is Good  Follow up: 1. Dr. Bridgett Larsson 2 weeks.  At that time, we will decide upon a plan for his AAA repair.   Evorn Gong, PA-C [facility] [phone] 01/05/2012  7:38 AM  Addendum  I have independently interviewed and examined the patient, and I agree with the physician assistant's findings.  Pt had uneventful L CEA except for limited amount of superficial bleeding due to Plavix/ASA.  Patient will follow in 2 weeks in the office, also with a CTA Abd/pelvis to reevaluate his AAA.  Adele Barthel, MD [facility] [place] Office: [phone] Pager: [phone]  01/05/2012, 10:53 AM

## 2012-01-18 DIAGNOSIS — I2510 Atherosclerotic heart disease of native coronary artery without angina pectoris: Secondary | ICD-10-CM | POA: Diagnosis not present

## 2012-01-25 ENCOUNTER — Encounter: Payer: Self-pay | Admitting: Vascular Surgery

## 2012-01-26 ENCOUNTER — Ambulatory Visit (INDEPENDENT_AMBULATORY_CARE_PROVIDER_SITE_OTHER): Payer: Medicare Other | Admitting: Vascular Surgery

## 2012-01-26 ENCOUNTER — Ambulatory Visit
Admission: RE | Admit: 2012-01-26 | Discharge: 2012-01-26 | Disposition: A | Discharge status: Home / Self Care | Payer: Medicare Other | Source: Ambulatory Visit | Attending: Vascular Surgery | Admitting: Vascular Surgery

## 2012-01-26 ENCOUNTER — Encounter: Payer: Self-pay | Admitting: Vascular Surgery

## 2012-01-26 VITALS — BP 124/75 | HR 54 | Resp 18 | Ht 68.0 in | Wt 258.1 lb

## 2012-01-26 DIAGNOSIS — Z9889 Other specified postprocedural states: Secondary | ICD-10-CM

## 2012-01-26 DIAGNOSIS — I6529 Occlusion and stenosis of unspecified carotid artery: Secondary | ICD-10-CM

## 2012-01-26 DIAGNOSIS — I714 Abdominal aortic aneurysm, without rupture: Secondary | ICD-10-CM

## 2012-01-26 MED ORDER — IOHEXOL 350 MG/ML SOLN
125.0000 mL | Freq: Once | INTRAVENOUS | Status: AC | PRN
  Administered 2012-01-26: 125 mL via INTRAVENOUS

## 2012-01-26 NOTE — Progress Notes (Signed)
[facility] [place]  Postoperative Visit  History of Present Illness  MAJIK PREVOT is a 66 y.o. male who presents for postoperative follow-up for: L CEA (Date: 01/03/11).  The patient's neck incision is healing.  The patient has had no stroke or TIA symptoms.  Physical Examination  Filed Vitals:   01/26/12 1350  BP: 124/75  Pulse: 54  Resp: 18    L Neck: Incision is healing, prior superficial hematoma resolved  Neuro: CN 2-12 are intact , Motor strength is 55 bilaterally, sensation is grossly intact  CTA Abd/Pelvis (01/26/12):  Minimal increase in size of the infrarenal abdominal aortic aneurysm with maximal AP diameter currently of 5 cm.   Otherwise stable patency of the visceral arteries and previously stented left renal artery and bilateral common iliac arteries.   Based on my review of the CTA, this patient's CTA is ~4.7 cm with adequate neck for EVAR.  His iliac may be a bit small for such however.  Medical Decision Making  KENSTON KLOTZ is a 66 y.o. male who presents s/p L CEA and stable AAA.  The patient's neck incision is healing with no stroke symptoms. I discussed in depth with the patient the nature of atherosclerosis, and emphasized the importance of maximal medical management including strict control of blood pressure, blood glucose, and lipid levels, obtaining regular exercise, and cessation of smoking.  The patient is aware that without maximal medical management the underlying atherosclerotic disease process will progress, limiting the benefit of any interventions. The patient's surveillance will included routine carotid duplex studies which will be completed in: 3 months, at which time the patient will be re-evaluated.   I emphasized the importance of routine surveillance of the carotid arteries as recurrence of stenosis is possible, especially with proper management of underlying atherosclerotic disease. The patient agrees to participate  in their maximal medical care and routine surveillance. If regards to AAA, the threshold for repair is AAA size > 5.5 cm, growth > 1 cm/yr, and symptomatic status. In 3 months, the patient will get an AAA duplex at the same time of his Carotid duplex  Thank you for allowing [place] to participate in this patient's care.  Adele Barthel, MD [facility] [place] Office: [phone] Pager: [phone]

## 2012-02-13 DIAGNOSIS — I472 Ventricular tachycardia: Secondary | ICD-10-CM | POA: Diagnosis not present

## 2012-02-13 DIAGNOSIS — I509 Heart failure, unspecified: Secondary | ICD-10-CM | POA: Diagnosis not present

## 2012-03-07 DIAGNOSIS — R0602 Shortness of breath: Secondary | ICD-10-CM | POA: Diagnosis not present

## 2012-03-07 DIAGNOSIS — Z79899 Other long term (current) drug therapy: Secondary | ICD-10-CM | POA: Diagnosis not present

## 2012-03-07 DIAGNOSIS — I509 Heart failure, unspecified: Secondary | ICD-10-CM | POA: Diagnosis not present

## 2012-03-07 DIAGNOSIS — R5383 Other fatigue: Secondary | ICD-10-CM | POA: Diagnosis not present

## 2012-03-07 DIAGNOSIS — Z4502 Encounter for adjustment and management of automatic implantable cardiac defibrillator: Secondary | ICD-10-CM | POA: Diagnosis not present

## 2012-03-07 DIAGNOSIS — R5381 Other malaise: Secondary | ICD-10-CM | POA: Diagnosis not present

## 2012-03-07 DIAGNOSIS — I2510 Atherosclerotic heart disease of native coronary artery without angina pectoris: Secondary | ICD-10-CM | POA: Diagnosis not present

## 2012-04-04 DIAGNOSIS — Z79899 Other long term (current) drug therapy: Secondary | ICD-10-CM | POA: Diagnosis not present

## 2012-04-04 DIAGNOSIS — E878 Other disorders of electrolyte and fluid balance, not elsewhere classified: Secondary | ICD-10-CM | POA: Diagnosis not present

## 2012-04-18 ENCOUNTER — Ambulatory Visit (INDEPENDENT_AMBULATORY_CARE_PROVIDER_SITE_OTHER): Payer: Medicare Other | Admitting: Internal Medicine

## 2012-04-18 ENCOUNTER — Other Ambulatory Visit (INDEPENDENT_AMBULATORY_CARE_PROVIDER_SITE_OTHER): Payer: Medicare Other

## 2012-04-18 ENCOUNTER — Encounter: Payer: Self-pay | Admitting: Internal Medicine

## 2012-04-18 VITALS — BP 136/84 | HR 64 | Temp 97.8°F | Resp 20 | Wt 274.0 lb

## 2012-04-18 DIAGNOSIS — D649 Anemia, unspecified: Secondary | ICD-10-CM | POA: Insufficient documentation

## 2012-04-18 DIAGNOSIS — R609 Edema, unspecified: Secondary | ICD-10-CM

## 2012-04-18 DIAGNOSIS — R972 Elevated prostate specific antigen [PSA]: Secondary | ICD-10-CM | POA: Diagnosis not present

## 2012-04-18 DIAGNOSIS — N401 Enlarged prostate with lower urinary tract symptoms: Secondary | ICD-10-CM

## 2012-04-18 DIAGNOSIS — G4733 Obstructive sleep apnea (adult) (pediatric): Secondary | ICD-10-CM

## 2012-04-18 DIAGNOSIS — R7309 Other abnormal glucose: Secondary | ICD-10-CM

## 2012-04-18 DIAGNOSIS — R0600 Dyspnea, unspecified: Secondary | ICD-10-CM | POA: Insufficient documentation

## 2012-04-18 DIAGNOSIS — R0989 Other specified symptoms and signs involving the circulatory and respiratory systems: Secondary | ICD-10-CM

## 2012-04-18 DIAGNOSIS — R0609 Other forms of dyspnea: Secondary | ICD-10-CM

## 2012-04-18 DIAGNOSIS — E039 Hypothyroidism, unspecified: Secondary | ICD-10-CM | POA: Insufficient documentation

## 2012-04-18 DIAGNOSIS — Z9989 Dependence on other enabling machines and devices: Secondary | ICD-10-CM | POA: Insufficient documentation

## 2012-04-18 DIAGNOSIS — D509 Iron deficiency anemia, unspecified: Secondary | ICD-10-CM | POA: Insufficient documentation

## 2012-04-18 DIAGNOSIS — N138 Other obstructive and reflux uropathy: Secondary | ICD-10-CM

## 2012-04-18 DIAGNOSIS — D510 Vitamin B12 deficiency anemia due to intrinsic factor deficiency: Secondary | ICD-10-CM

## 2012-04-18 DIAGNOSIS — I2510 Atherosclerotic heart disease of native coronary artery without angina pectoris: Secondary | ICD-10-CM

## 2012-04-18 DIAGNOSIS — J4489 Other specified chronic obstructive pulmonary disease: Secondary | ICD-10-CM

## 2012-04-18 DIAGNOSIS — J449 Chronic obstructive pulmonary disease, unspecified: Secondary | ICD-10-CM

## 2012-04-18 LAB — URINALYSIS, ROUTINE W REFLEX MICROSCOPIC
Bilirubin Urine: NEGATIVE
Ketones, ur: NEGATIVE
Nitrite: NEGATIVE
Specific Gravity, Urine: <=1.005 (ref 1.000–1.030)
Total Protein, Urine: NEGATIVE
Urine Glucose: NEGATIVE
Urobilinogen, UA: 0.2 (ref 0.0–1.0)
pH: 6 (ref 5.0–8.0)

## 2012-04-18 LAB — LIPID PANEL
Cholesterol: 118 mg/dL (ref 0–200)
HDL: 51.2 mg/dL (ref >39.00)
LDL Cholesterol: 57 mg/dL (ref 0–99)
Total CHOL/HDL Ratio: 2
Triglycerides: 51 mg/dL (ref 0.0–149.0)
VLDL: 10.2 mg/dL (ref 0.0–40.0)

## 2012-04-18 LAB — CBC WITH DIFFERENTIAL/PLATELET
Basophils Absolute: 0 10*3/uL (ref 0.0–0.1)
Basophils Relative: 0.6 % (ref 0.0–3.0)
Eosinophils Absolute: 0.2 10*3/uL (ref 0.0–0.7)
Eosinophils Relative: 3.5 % (ref 0.0–5.0)
HCT: 30.1 % — ABNORMAL LOW (ref 39.0–52.0)
Hemoglobin: 9.4 g/dL — ABNORMAL LOW (ref 13.0–17.0)
Lymphocytes Relative: 14.7 % (ref 12.0–46.0)
Lymphs Abs: 0.7 10*3/uL (ref 0.7–4.0)
MCHC: 31 g/dL (ref 30.0–36.0)
MCV: 72.6 fl — ABNORMAL LOW (ref 78.0–100.0)
Monocytes Absolute: 0.6 10*3/uL (ref 0.1–1.0)
Monocytes Relative: 11.8 % (ref 3.0–12.0)
Neutro Abs: 3.5 10*3/uL (ref 1.4–7.7)
Neutrophils Relative %: 69.4 % (ref 43.0–77.0)
Platelets: 141 10*3/uL — ABNORMAL LOW (ref 150.0–400.0)
RBC: 4.15 Mil/uL — ABNORMAL LOW (ref 4.22–5.81)
RDW: 17.3 % — ABNORMAL HIGH (ref 11.5–14.6)
WBC: 5 10*3/uL (ref 4.5–10.5)

## 2012-04-18 LAB — COMPREHENSIVE METABOLIC PANEL
ALT: 19 U/L (ref 0–53)
AST: 20 U/L (ref 0–37)
Albumin: 3.7 g/dL (ref 3.5–5.2)
Alkaline Phosphatase: 59 U/L (ref 39–117)
BUN: 25 mg/dL — ABNORMAL HIGH (ref 6–23)
CO2: 28 mEq/L (ref 19–32)
Calcium: 8.8 mg/dL (ref 8.4–10.5)
Chloride: 106 mEq/L (ref 96–112)
Creatinine, Ser: 0.8 mg/dL (ref 0.4–1.5)
GFR: 102.4 mL/min (ref >60.00)
Glucose, Bld: 101 mg/dL — ABNORMAL HIGH (ref 70–99)
Potassium: 4.7 mEq/L (ref 3.5–5.1)
Sodium: 141 mEq/L (ref 135–145)
Total Bilirubin: 0.7 mg/dL (ref 0.3–1.2)
Total Protein: 6.6 g/dL (ref 6.0–8.3)

## 2012-04-18 LAB — IBC PANEL
Iron: 17 ug/dL — ABNORMAL LOW (ref 42–165)
Saturation Ratios: 3.6 % — ABNORMAL LOW (ref 20.0–50.0)
Transferrin: 340.6 mg/dL (ref 212.0–360.0)

## 2012-04-18 LAB — VITAMIN B12: Vitamin B-12: 243 pg/mL (ref 211–911)

## 2012-04-18 LAB — FOLATE: Folate: >24.8 ng/mL (ref >5.9)

## 2012-04-18 LAB — BRAIN NATRIURETIC PEPTIDE: Pro B Natriuretic peptide (BNP): 248 pg/mL — ABNORMAL HIGH (ref 0.0–100.0)

## 2012-04-18 LAB — PSA: PSA: 0.51 ng/mL (ref 0.10–4.00)

## 2012-04-18 LAB — TSH: TSH: 5.77 u[IU]/mL — ABNORMAL HIGH (ref 0.35–5.50)

## 2012-04-18 LAB — HEMOGLOBIN A1C: Hgb A1c MFr Bld: 6.4 % (ref 4.6–6.5)

## 2012-04-18 LAB — FERRITIN: Ferritin: 5.7 ng/mL — ABNORMAL LOW (ref 22.0–322.0)

## 2012-04-18 MED ORDER — LEVOTHYROXINE SODIUM 25 MCG PO TABS: 25.0000 ug | ORAL_TABLET | Freq: Every day | ORAL | Status: DC

## 2012-04-18 MED ORDER — TIOTROPIUM BROMIDE MONOHYDRATE 18 MCG IN CAPS: 18.0000 ug | ORAL_CAPSULE | Freq: Every day | RESPIRATORY_TRACT | Status: DC

## 2012-04-18 MED ORDER — FUROSEMIDE 80 MG PO TABS: 80.0000 mg | ORAL_TABLET | Freq: Two times a day (BID) | ORAL | Status: DC

## 2012-04-18 NOTE — Progress Notes (Signed)
Subjective:    Patient ID: [name], male    DOB: [birth year], 66 y.o.   MRN: [id number]  Shortness of Breath This is a chronic problem. Episode onset: for 4 months, slowly worsening. The problem occurs constantly. The problem has been gradually worsening. The average episode lasts 4 months. Associated symptoms include leg swelling, orthopnea and PND. Pertinent negatives include no abdominal pain, chest pain, claudication, coryza, ear pain, fever, headaches, hemoptysis, leg pain, neck pain, rash, rhinorrhea, sore throat, sputum production, swollen glands, syncope, vomiting or wheezing. The symptoms are aggravated by nothing. He has tried ipratropium inhalers for the symptoms. The treatment provided mild relief. His past medical history is significant for CAD, COPD and a heart failure.      Review of Systems  Constitutional: Positive for unexpected weight change (significant weight gain). Negative for fever, chills, diaphoresis, activity change and appetite change.  HENT: Negative for ear pain, sore throat, facial swelling, rhinorrhea, neck pain and neck stiffness.   Eyes: Negative.   Respiratory: Positive for apnea and shortness of breath. Negative for cough, hemoptysis, sputum production, choking, chest tightness, wheezing and stridor.   Cardiovascular: Positive for orthopnea, leg swelling and PND. Negative for chest pain, palpitations, claudication and syncope.  Gastrointestinal: Positive for nausea. Negative for vomiting, abdominal pain, diarrhea, constipation, blood in stool, abdominal distention, anal bleeding and rectal pain.  Genitourinary: Negative for dysuria, urgency, frequency, hematuria, flank pain, decreased urine volume, discharge, penile swelling, scrotal swelling, enuresis, difficulty urinating, genital sores, penile pain and testicular pain.  Musculoskeletal: Negative for myalgias, back pain, joint swelling, arthralgias and gait problem.  Skin: Negative for color change,  pallor, rash and wound.  Neurological: Positive for weakness. Negative for dizziness, tremors, seizures, syncope, facial asymmetry, speech difficulty, light-headedness, numbness and headaches.  Hematological: Negative for adenopathy. Does not bruise/bleed easily.  Psychiatric/Behavioral: Negative.        Objective:   Physical Exam  Vitals reviewed. Constitutional: He is oriented to person, place, and time. He appears well-developed and well-nourished. No distress.  HENT:  Head: Normocephalic and atraumatic.  Mouth/Throat: Oropharynx is clear and moist. No oropharyngeal exudate.  Eyes: Conjunctivae are normal. Right eye exhibits no discharge. Left eye exhibits no discharge. No scleral icterus.  Neck: Normal range of motion. Neck supple. No JVD present. No tracheal deviation present. No thyromegaly present.  Cardiovascular: Normal rate, regular rhythm, normal heart sounds and intact distal pulses.  Exam reveals no gallop and no friction rub.   No murmur heard. Pulmonary/Chest: Effort normal. No accessory muscle usage or stridor. Not tachypneic. No respiratory distress. He has no wheezes. He has rhonchi in the right lower field. He has no rales.  Abdominal: Soft. Bowel sounds are normal. He exhibits no distension and no mass. There is no tenderness. There is no rebound and no guarding.  Musculoskeletal: Normal range of motion. He exhibits edema (3+ edema in BLE). He exhibits no tenderness.  Lymphadenopathy:    He has no cervical adenopathy.  Neurological: He is oriented to person, place, and time.  Skin: Skin is warm and dry. No rash noted. He is not diaphoretic. No erythema. No pallor.  Psychiatric: He has a normal mood and affect. His behavior is normal. Judgment and thought content normal.     Lab Results  Component Value Date   WBC 9.5 01/05/2012   HGB 10.3* 01/05/2012   HCT 33.3* 01/05/2012   PLT 141* 01/05/2012   GLUCOSE 113* 01/05/2012   CHOL 99 07/11/2010   TRIG 58.0  07/11/2010    HDL 36.00* 07/11/2010   LDLCALC 51 07/11/2010   ALT 23 01/03/2012   AST 21 01/03/2012   NA 138 01/05/2012   K 4.2 01/05/2012   CL 104 01/05/2012   CREATININE 0.61 01/05/2012   BUN 14 01/05/2012   CO2 26 01/05/2012   TSH 0.04* 07/11/2010   PSA 2.68 07/11/2010   INR 1.02 01/03/2012   HGBA1C 5.5 07/11/2010       Assessment & Plan:

## 2012-04-18 NOTE — Assessment & Plan Note (Signed)
He tells me that Dr. Rollene Fare started him on spiriva and it appears to be helping, I have asked him to see pulm

## 2012-04-18 NOTE — Progress Notes (Signed)
Addended by: [name] on: 04/18/2012 05:22 PM   Modules accepted: Orders

## 2012-04-18 NOTE — Assessment & Plan Note (Signed)
I will check his CBC/folate/B12 levels today

## 2012-04-18 NOTE — Patient Instructions (Signed)
Edema Edema is an abnormal build-up of fluids in tissues. Because this is partly dependent on gravity (water flows to the lowest place), it is more common in the leg sand thighs (lower extremities). It is also common in the looser tissues, like around the eyes. Painless swelling of the feet and ankles is common and increases as a person ages. It may affect both legs and may include the calves or even thighs. When squeezed, the fluid may move out of the affected area and may leave a dent for a few moments. CAUSES   Prolonged standing or sitting in one place for extended periods of time. Movement helps pump tissue fluid into the veins, and absence of movement prevents this, resulting in edema.   Varicose veins. The valves in the veins do not work as well as they should. This causes fluid to leak into the tissues.   Fluid and salt overload.   Injury, burn, or surgery to the leg, ankle, or foot, may damage veins and allow fluid to leak out.   Sunburn damages vessels. Leaky vessels allow fluid to go out into the sunburned tissues.   Allergies (from insect bites or stings, medications or chemicals) cause swelling by allowing vessels to become leaky.   Protein in the blood helps keep fluid in your vessels. Low protein, as in malnutrition, allows fluid to leak out.   Hormonal changes, including pregnancy and menstruation, cause fluid retention. This fluid may leak out of vessels and cause edema.   Medications that cause fluid retention. Examples are sex hormones, blood pressure medications, steroid treatment, or anti-depressants.   Some illnesses cause edema, especially heart failure, kidney disease, or liver disease.   Surgery that cuts veins or lymph nodes, such as surgery done for the heart or for breast cancer, may result in edema.  DIAGNOSIS  Your caregiver is usually easily able to determine what is causing your swelling (edema) by simply asking what is wrong (getting a history) and examining  you (doing a physical). Sometimes x-rays, EKG (electrocardiogram or heart tracing), and blood work may be done to evaluate for underlying medical illness. TREATMENT  General treatment includes:  Leg elevation (or elevation of the affected body part).   Restriction of fluid intake.   Prevention of fluid overload.   Compression of the affected body part. Compression with elastic bandages or support stockings squeezes the tissues, preventing fluid from entering and forcing it back into the blood vessels.   Diuretics (also called water pills or fluid pills) pull fluid out of your body in the form of increased urination. These are effective in reducing the swelling, but can have side effects and must be used only under your caregiver's supervision. Diuretics are appropriate only for some types of edema.  The specific treatment can be directed at any underlying causes discovered. Heart, liver, or kidney disease should be treated appropriately. HOME CARE INSTRUCTIONS   Elevate the legs (or affected body part) above the level of the heart, while lying down.   Avoid sitting or standing still for prolonged periods of time.   Avoid putting anything directly under the knees when lying down, and do not wear constricting clothing or garters on the upper legs.   Exercising the legs causes the fluid to work back into the veins and lymphatic channels. This may help the swelling go down.   The pressure applied by elastic bandages or support stockings can help reduce ankle swelling.   A low-salt diet may help reduce fluid   retention and decrease the ankle swelling.   Take any medications exactly as prescribed.  SEEK MEDICAL CARE IF:  Your edema is not responding to recommended treatments. SEEK IMMEDIATE MEDICAL CARE IF:   You develop shortness of breath or chest pain.   You cannot breathe when you lay down; or if, while lying down, you have to get up and go to the window to get your breath.   You  are having increasing swelling without relief from treatment.   You develop a fever over 102 F (38.9 C).   You develop pain or redness in the areas that are swollen.   Tell your caregiver right away if you have gained 3 lb/1.4 kg in 1 day or 5 lb/2.3 kg in a week.  MAKE SURE YOU:   Understand these instructions.   Will watch your condition.   Will get help right away if you are not doing well or get worse.  Document Released: 11/27/2005 Document Revised: 11/16/2011 Document Reviewed: 07/15/2008 [employer] Patient Information 2012 [employer]. 

## 2012-04-18 NOTE — Assessment & Plan Note (Signed)
He has edema so I am concerned about CHF and have therefore increased his lasix dose, I will check his labs today to look for other causes and will check his CXR to look for mass, pulm edema, etc.

## 2012-04-18 NOTE — Assessment & Plan Note (Signed)
He is in desperate need of a CPAP titration

## 2012-04-18 NOTE — Assessment & Plan Note (Signed)
His EKG is unchanged today, He saw Dr. Rollene Fare about 4 weeks ago, I have asked him to f/up with Dr. Viona Gilmore again in the next 2-3 weeks to check him for CHF

## 2012-04-18 NOTE — Assessment & Plan Note (Signed)
He has no s/s of blood loss, I will check his CBC and vitamin levels

## 2012-04-18 NOTE — Assessment & Plan Note (Signed)
PSA today

## 2012-04-18 NOTE — Assessment & Plan Note (Signed)
I will check his a1c today to look for DM II

## 2012-04-25 ENCOUNTER — Encounter: Payer: Self-pay | Admitting: Vascular Surgery

## 2012-04-26 ENCOUNTER — Other Ambulatory Visit (INDEPENDENT_AMBULATORY_CARE_PROVIDER_SITE_OTHER): Payer: Medicare Other | Admitting: *Deleted

## 2012-04-26 ENCOUNTER — Ambulatory Visit (INDEPENDENT_AMBULATORY_CARE_PROVIDER_SITE_OTHER): Payer: Medicare Other | Admitting: Vascular Surgery

## 2012-04-26 ENCOUNTER — Encounter: Payer: Self-pay | Admitting: Vascular Surgery

## 2012-04-26 ENCOUNTER — Encounter (INDEPENDENT_AMBULATORY_CARE_PROVIDER_SITE_OTHER): Payer: Medicare Other | Admitting: *Deleted

## 2012-04-26 ENCOUNTER — Other Ambulatory Visit: Payer: Medicare Other

## 2012-04-26 VITALS — BP 142/60 | HR 64 | Resp 16 | Ht 68.0 in | Wt 265.2 lb

## 2012-04-26 DIAGNOSIS — I6523 Occlusion and stenosis of bilateral carotid arteries: Secondary | ICD-10-CM

## 2012-04-26 DIAGNOSIS — I6529 Occlusion and stenosis of unspecified carotid artery: Secondary | ICD-10-CM

## 2012-04-26 DIAGNOSIS — Z48812 Encounter for surgical aftercare following surgery on the circulatory system: Secondary | ICD-10-CM

## 2012-04-26 DIAGNOSIS — I714 Abdominal aortic aneurysm, without rupture, unspecified: Secondary | ICD-10-CM

## 2012-04-26 DIAGNOSIS — Z9889 Other specified postprocedural states: Secondary | ICD-10-CM

## 2012-04-26 DIAGNOSIS — I658 Occlusion and stenosis of other precerebral arteries: Secondary | ICD-10-CM | POA: Diagnosis not present

## 2012-04-26 NOTE — Progress Notes (Signed)
[facility] [place]  Established Abdominal Aortic Aneurysm  History of Present Illness  The patient is a 66 y.o. (1946/01/03) male who presents with chief complaint: follow up for AAA and L carotid stenosis.  Previous studies demonstrate an AAA, measuring 4.7 cm.  The patient does not have back or abdominal pain.  The patient is not a smoker.  Additionally, the patient is s/p L CEA 01/04/12.  He denies any CVA or TIA sx.  He notes near resolution of numbness in incision.  The patient notes some cardiopulmonary decompensation since the surgery.  Also his thyroid has become hypothyroid.  The patient's PMH, PSH, SH, FamHx, Med, Allergies and ROS are unchanged from 01/26/12.  Physical Examination  Filed Vitals:   04/26/12 1016 04/26/12 1021  BP: 137/71 142/60  Pulse: 65 64  Resp: 16 16  Height: 5\' 8"  (1.727 m) 5\' 8"  (1.727 m)  Weight: 265 lb 3.2 oz (120.294 kg) 265 lb 3.2 oz (120.294 kg)  SpO2: 97% 96%   Body mass index is 40.32 kg/(m^2).  General: A&O x 3, WDWN, obese  Pulmonary: Sym exp, good air movt, CTAB, no rales, rhonchi, & wheezing  Cardiac: RRR, Nl S1, S2, no Murmurs, rubs or gallops  Vascular: Vessel Right Left  Radial Palpable Palpable  Brachial Palpable Palpable  Carotid Proximal palpable, no bruit Palpable, without bruit  Aorta Non-palpable due to obesity [name]  Femoral Palpable Palpable  Popliteal Non-palpable Non-palpable  PT Faintly Palpable Faintly Palpable  DP Faintly Palpable Faintly Palpable   Gastrointestinal: soft, NTND, -G/R, - HSM, - masses, - CVAT B, obese, non-palpable AAA  Musculoskeletal: M/S 5/5 throughout , Extremities without ischemic changes , B 1+ edema  Neurologic: Pain and light touch intact in extremities , Motor exam as listed above  Non-Invasive Vascular Imaging  AAA Duplex (Date: 04/26/12)  Previous size: 4.7 cm on CTA (Date: 01/26/12)  Current size:  4.94 cm x 4.79 cm (Date: 04/26/12)   CAROTID DUPLEX (Date:  04/26/12):   L ICA stenosis: 40-59% (possible due to vessel caliber change)  L VA:  patent and antegrade  Medical Decision Making  The patient is a 66 y.o. male who presents with: asx AAA , s/p L CEA   Based on this patient's exam and diagnostic studies, he needs AAA duplex q6 month, B carotid duplex annually.  Some of the elevation in velocity on the L is due to compensatory flow due to R ICA occlusion  The threshold for repair is AAA size > 5.5 cm, growth > 1 cm/yr, and symptomatic status.  I emphasized the importance of maximal medical management including strict control of blood pressure, blood glucose, and lipid levels, antiplatelet agents, obtaining regular exercise, and cessation of smoking.    The patient will follow up with me in 6 months.  Thank you for allowing [place] to participate in this patient's care.  Adele Barthel, MD [facility] [place] Office: [phone] Pager: [phone]  04/26/2012, 11:11 AM

## 2012-05-03 NOTE — Procedures (Unsigned)
DUPLEX ULTRASOUND OF ABDOMINAL AORTA  INDICATION:  Abdominal aortic aneurysm.  HISTORY: Diabetes:  Yes Cardiac:  Yes Hypertension:  Yes Smoking:  No Connective Tissue Disorder: Family History:  No Previous Surgery:  No  DUPLEX EXAM:         AP (cm)                   TRANSVERSE (cm) Proximal             2.85 cm                   2.99 cm Mid                  Not visualized            Not visualized Distal               4.94 cm                   4.78 cm Right Iliac          Not visualized            Not visualized Left Iliac           Not visualized            Not visualized  PREVIOUS: Date: Previous CT on 01/26/2012  AP:  5.0  TRANSVERSE:  IMPRESSION: 1. Aneurysmal dilatation of the distal abdominal aorta with no     significant change when compared to the previous CT, based on     limited visualization. 2. The mid abdominal aorta and bilateral common iliac arteries were     not adequately visualized due to overlying bowel gas patterns and     patient body habitus.  ___________________________________________ Conrad [place], MD  CH/MEDQ  D:  04/29/2012  T:  04/29/2012  Job:  [id number]

## 2012-05-03 NOTE — Procedures (Unsigned)
CAROTID DUPLEX EXAM  INDICATION:  Left carotid endarterectomy  HISTORY: Diabetes:  Yes Cardiac:  Yes Hypertension:  Yes Smoking:  No Previous Surgery:  Left carotid endarterectomy on 01/04/2012 CV History:  Currently asymptomatic Amaurosis Fugax No, Paresthesias No, Hemiparesis No                                      RIGHT             LEFT Brachial systolic pressure:         116               122 Brachial Doppler waveforms:         Normal            Normal Vertebral direction of flow:        Antegrade         Antegrade DUPLEX VELOCITIES (cm/sec) CCA peak systolic                                     76 ECA peak systolic                                     123456 ICA peak systolic                                     99991111 ICA end diastolic                                     55 PLAQUE MORPHOLOGY: PLAQUE AMOUNT:                                        None PLAQUE LOCATION:  IMPRESSION: 1. Patent left carotid endarterectomy site.  Doppler velocity suggests     a 40%-59% stenosis of the left proximal internal carotid artery.     However, this may be due to a change in vessel diameter versus     intimal hyperplasia. 2. The bilateral vertebral arteries are antegrade. 3. Limited visualization of the left internal carotid artery due to     patient body habitus and vessel depth. 4. Significant improvement of the left internal carotid artery     velocities when compared to the previous exam on 12/29/2011.  ___________________________________________ Conrad Naranja, MD  CH/MEDQ  D:  04/29/2012  T:  04/29/2012  Job:  [id number]

## 2012-05-13 ENCOUNTER — Institutional Professional Consult (permissible substitution): Payer: Medicare Other | Admitting: Pulmonary Disease

## 2012-05-13 DIAGNOSIS — I509 Heart failure, unspecified: Secondary | ICD-10-CM | POA: Diagnosis not present

## 2012-05-13 DIAGNOSIS — I472 Ventricular tachycardia: Secondary | ICD-10-CM | POA: Diagnosis not present

## 2012-05-13 DIAGNOSIS — I4891 Unspecified atrial fibrillation: Secondary | ICD-10-CM | POA: Diagnosis not present

## 2012-06-03 ENCOUNTER — Ambulatory Visit (INDEPENDENT_AMBULATORY_CARE_PROVIDER_SITE_OTHER): Payer: Medicare Other | Admitting: Internal Medicine

## 2012-06-03 ENCOUNTER — Ambulatory Visit (INDEPENDENT_AMBULATORY_CARE_PROVIDER_SITE_OTHER)
Admission: RE | Admit: 2012-06-03 | Discharge: 2012-06-03 | Disposition: A | Discharge status: Home / Self Care | Payer: Medicare Other | Source: Ambulatory Visit | Attending: Internal Medicine | Admitting: Internal Medicine

## 2012-06-03 ENCOUNTER — Encounter: Payer: Self-pay | Admitting: Internal Medicine

## 2012-06-03 VITALS — BP 102/58 | HR 60 | Ht 68.0 in | Wt 266.6 lb

## 2012-06-03 DIAGNOSIS — R0609 Other forms of dyspnea: Secondary | ICD-10-CM

## 2012-06-03 DIAGNOSIS — R0600 Dyspnea, unspecified: Secondary | ICD-10-CM

## 2012-06-03 DIAGNOSIS — R0989 Other specified symptoms and signs involving the circulatory and respiratory systems: Secondary | ICD-10-CM

## 2012-06-03 DIAGNOSIS — Z951 Presence of aortocoronary bypass graft: Secondary | ICD-10-CM | POA: Diagnosis not present

## 2012-06-03 DIAGNOSIS — G4733 Obstructive sleep apnea (adult) (pediatric): Secondary | ICD-10-CM

## 2012-06-03 DIAGNOSIS — J4489 Other specified chronic obstructive pulmonary disease: Secondary | ICD-10-CM

## 2012-06-03 DIAGNOSIS — J449 Chronic obstructive pulmonary disease, unspecified: Secondary | ICD-10-CM | POA: Diagnosis not present

## 2012-06-03 NOTE — Progress Notes (Addendum)
06/03/12- 73 yoM former smoker seen in pulmonary consultation at the request of Dr. Ronnald Ramp. Per Dr. Ronnald Ramp. Patient c/o sob and difficultly breathing when sitting while talking and walking.  Denies chest pain, chest tightness, wheeizng, and cough. Diagnosed with COPD 10 years ago. Wife is here. COPD Assessment Test (CAT) scored 24/40. He had smoked a pack a day half per day until 2005. 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 He was then diagnosed with hypothyroidism. Since then he has not exercised regularly, has felt weak with easier dyspnea on exertion. He denies cough or wheeze. Little change from day to day. His wife says he gets short of breath climbing 10 steps and he has to stop at the top. He has gained weight since thyroid diagnosis and with lack of exercise. He indicates 40 pound weight gain in the past 2 years. Coronary artery disease with history of multiple myocardial infarctions, congestive heart failure, hypertension, implanted AICD in left chest. No history of asthma. Ankles swell. Diagnosed obstructive sleep apnea/Dr. Kelly/CPAP/Advanced, unknown pressure. Anemia 4 years ago required transfusion. Uses Spiriva daily, occasional rescue Combivent.  Prior to Admission medications   Medication Sig Start Date End Date Taking? Authorizing Provider  amiodarone (CORDARONE) 200 MG tablet Take 200 mg by mouth daily.    Yes Historical Provider, MD  aspirin 81 MG tablet Take 81 mg by mouth daily.   Yes Historical Provider, MD  atorvastatin (LIPITOR) 40 MG tablet Take 40 mg by mouth daily.     Yes Historical Provider, MD  CALCIUM PO Take 1 tablet by mouth daily.   Yes Historical Provider, MD  carvedilol (COREG) 25 MG tablet Take 25 mg by mouth 2 (two) times daily with a meal.     Yes Historical Provider, MD  cholecalciferol (VITAMIN D) 1000 UNITS tablet Take 1,000 Units by mouth daily.   Yes  Historical Provider, MD  clopidogrel (PLAVIX) 75 MG tablet Take 75 mg by mouth daily.     Yes Historical Provider, MD  COMBIVENT 18-103 MCG/ACT inhaler  12/25/11  Yes Historical Provider, MD  CRANBERRY PO Take 140 mg by mouth daily.   Yes Historical Provider, MD  finasteride (PROSCAR) 5 MG tablet Take 5 mg by mouth daily.   Yes Historical Provider, MD  fish oil-omega-3 fatty acids 1000 MG capsule Take 2 g by mouth daily.     Yes Historical Provider, MD  furosemide (LASIX) 80 MG tablet Take 1 tablet (80 mg total) by mouth 2 (two) times daily. 04/18/12 04/18/13 Yes [name], MD  glucose blood (ONE TOUCH TEST STRIPS) test strip 1 each by Other route as needed. Use as instructed    Yes Historical Provider, MD  isosorbide mononitrate (IMDUR) 60 MG 24 hr tablet Take 60 mg by mouth daily. Take 1 tablet every am and 0.5 tablet at bedtime   Yes Historical Provider, MD  levothyroxine (LEVOTHROID) 25 MCG tablet Take 1 tablet (25 mcg total) by mouth daily. 04/18/12 04/18/13 Yes [name], MD  Multiple Vitamins-Minerals (MENS 50+ MULTI VITAMIN/MIN) TABS Take by mouth daily.   Yes Historical Provider, MD  multivitamin-lutein (OCUVITE-LUTEIN) CAPS Take 1 capsule by mouth daily.     Yes Historical Provider, MD  nitroGLYCERIN (NITROSTAT) 0.4 MG SL tablet Place 0.4 mg under the tongue every 5 (five) minutes as needed.     Yes Historical Provider, MD  ONE TOUCH LANCETS MISC by  Does not apply route daily.     Yes Historical Provider, MD  ramipril (ALTACE) 2.5 MG tablet Take 2.5-5 mg by mouth daily. Take 2 capsule every morning Take 1 capsule every evening   Yes Historical Provider, MD  spironolactone (ALDACTONE) 25 MG tablet Take 25 mg by mouth daily.     Yes Historical Provider, MD  Tamsulosin HCl (FLOMAX) 0.4 MG CAPS Take 0.4 mg by mouth daily.   Yes Historical Provider, MD  tiotropium (SPIRIVA HANDIHALER) 18 MCG inhalation capsule Place 1 capsule (18 mcg total) into inhaler and inhale daily. 04/18/12 04/18/13 Yes  [name], MD   Past Medical History  Diagnosis Date  . Myocardial infarction   . Hypertension   . CHF (congestive heart failure)   . COPD (chronic obstructive pulmonary disease)   . GERD (gastroesophageal reflux disease)   . AAA (abdominal aortic aneurysm)   . Tremor   . Sleep apnea   . Dysrhythmia     ICD  . Diabetes mellitus     DIET CONTROLLED  . Carotid artery stenosis   . Peripheral vascular disease    Past Surgical History  Procedure Date  . Coronary artery bypass graft   . Cardiac catheterization   . Coronary angioplasty   . Renal artery stent   . Angioplasty     BILATERAL  LE  W/STENTS  . Endarterectomy 01/04/2012    Procedure: ENDARTERECTOMY CAROTID;  Surgeon: Hinda Lenis, MD;  Location: [facility];  Service: Vascular;  Laterality: Left;  with patch angioplasty  . Carotid endarterectomy 01/04/12   Family History  Problem Relation Age of Onset  . Colon cancer Mother   . Diabetes Sister   . Heart disease Sister   . Heart disease Brother   . Lung cancer Maternal Grandmother    History   Social History  . Marital Status: Married    Spouse Name: [name]    Number of Children: [name]  . Years of Education: [name]   Occupational History  . Not on file.   Social History Main Topics  . Smoking status: Former Smoker    Quit date: 12/11/2002  . Smokeless tobacco: Not on file  . Alcohol Use: No  . Drug Use: No  . Sexually Active: Not on file   Other Topics Concern  . Not on file   Social History Narrative  . No narrative on file   ROS-see HPI Constitutional:   No-   weight loss, night sweats, fevers, chills, fatigue, lassitude. HEENT:   No-  headaches, difficulty swallowing, tooth/dental problems, sore throat,       No-  sneezing, itching, ear ache, nasal congestion, post nasal drip,  CV:  No-   chest pain, orthopnea, PND, +swelling in lower extremities, No-anasarca, dizziness, palpitations Resp: + shortness of breath with exertion or at rest.               No-   productive cough,  No non-productive cough,  No- coughing up of blood.              No-   change in color of mucus.  No- wheezing.   Skin: No-   rash or lesions. GI:  No-   heartburn, indigestion, abdominal pain, nausea, vomiting, diarrhea,                 change in bowel habits, loss of appetite GU: No-   dysuria, change in color of urine, no urgency or frequency.  No-  flank pain. MS:  No-   joint pain or swelling.  No- decreased range of motion.  No- back pain. Neuro-     nothing unusual Psych:  No- change in mood or affect. No depression or anxiety.  No memory loss.  OBJ- Physical Exam BP 102/58  Pulse 60  Ht 5\' 8"  (1.727 m)  Wt 266 lb 9.6 oz (120.929 kg)  BMI 40.54 kg/m2  SpO2 97%  General- Alert, Oriented, Affect-appropriate, Distress- none acute, obese, talkative Skin- rash-none, lesions- none, excoriation- none Lymphadenopathy- none Head- atraumatic            Eyes- Gross vision intact, PERRLA, conjunctivae and secretions clear            Ears- Hearing, canals-normal            Nose- Clear, no-Septal dev, mucus, polyps, erosion, perforation             Throat- Mallampati III, mucosa clear , drainage- none, tonsils- atrophic Neck- flexible , trachea midline, no stridor , thyroid nl, carotid no bruit Chest - symmetrical excursion , unlabored           Heart/CV- RRR , no murmur , no gallop  , no rub, nl s1 s2                           - JVD- none , edema 1+, stasis changes- none, varices- none           Lung- clear to P&A, wheeze- none, cough- none , dullness-none, rub- none, raspy last           Chest wall- should not of a scar Abd- tender-no, distended-no, bowel sounds-present, HSM- no Br/ Gen/ Rectal- Not done, not indicated Extrem- cyanosis- none, clubbing, none, atrophy- none, strength- nl, vein donor scars Neuro- head bob tremor

## 2012-06-03 NOTE — Patient Instructions (Addendum)
Order- CXR-   Dx Dyspnea              PFT with 6 minute walk test   Dx Dyspnea

## 2012-06-06 NOTE — Progress Notes (Signed)
Quick Note:  LMTCB ______ 

## 2012-06-08 NOTE — Assessment & Plan Note (Signed)
Good compliance and control by his description, supported by his wife. Unknown pressure.

## 2012-06-08 NOTE — Assessment & Plan Note (Signed)
Dyspnea and exercise limitation may be significantly related to obesity, deconditioning and cardiovascular disease. We will try to define his actual pulmonary status. Plan-chest x-ray, PFT with 6 minute walk test. We will continue Spiriva with albuterol rescue inhaler pending these results.

## 2012-06-10 NOTE — Progress Notes (Signed)
Quick Note:  Pt aware of results. ______ 

## 2012-06-11 DIAGNOSIS — I447 Left bundle-branch block, unspecified: Secondary | ICD-10-CM | POA: Diagnosis not present

## 2012-06-11 DIAGNOSIS — R5381 Other malaise: Secondary | ICD-10-CM | POA: Diagnosis not present

## 2012-06-11 DIAGNOSIS — R0602 Shortness of breath: Secondary | ICD-10-CM | POA: Diagnosis not present

## 2012-06-11 DIAGNOSIS — R079 Chest pain, unspecified: Secondary | ICD-10-CM | POA: Diagnosis not present

## 2012-06-11 DIAGNOSIS — Z79899 Other long term (current) drug therapy: Secondary | ICD-10-CM | POA: Diagnosis not present

## 2012-06-11 DIAGNOSIS — R5383 Other fatigue: Secondary | ICD-10-CM | POA: Diagnosis not present

## 2012-06-11 DIAGNOSIS — J449 Chronic obstructive pulmonary disease, unspecified: Secondary | ICD-10-CM | POA: Diagnosis not present

## 2012-06-16 HISTORY — PX: OTHER SURGICAL HISTORY: SHX169

## 2012-06-28 ENCOUNTER — Telehealth: Payer: Self-pay | Admitting: *Deleted

## 2012-06-28 NOTE — Telephone Encounter (Signed)
patient's wife confirmed over the phone for a 07-02-2012 starting at 1:30pm

## 2012-07-01 ENCOUNTER — Other Ambulatory Visit: Payer: Self-pay | Admitting: Oncology

## 2012-07-01 ENCOUNTER — Telehealth: Payer: Self-pay | Admitting: Oncology

## 2012-07-01 DIAGNOSIS — D649 Anemia, unspecified: Secondary | ICD-10-CM

## 2012-07-01 NOTE — Telephone Encounter (Signed)
Referred by Dr. Rollene Fare Dx- Anemia

## 2012-07-02 ENCOUNTER — Encounter: Payer: Self-pay | Admitting: Oncology

## 2012-07-02 ENCOUNTER — Ambulatory Visit: Payer: Medicare Other

## 2012-07-02 ENCOUNTER — Telehealth: Payer: Self-pay | Admitting: *Deleted

## 2012-07-02 ENCOUNTER — Ambulatory Visit (HOSPITAL_BASED_OUTPATIENT_CLINIC_OR_DEPARTMENT_OTHER): Payer: Medicare Other | Admitting: Oncology

## 2012-07-02 ENCOUNTER — Other Ambulatory Visit (HOSPITAL_BASED_OUTPATIENT_CLINIC_OR_DEPARTMENT_OTHER): Payer: Medicare Other | Admitting: Lab

## 2012-07-02 VITALS — BP 95/52 | HR 55 | Temp 98.5°F | Wt 280.6 lb

## 2012-07-02 DIAGNOSIS — D649 Anemia, unspecified: Secondary | ICD-10-CM | POA: Diagnosis not present

## 2012-07-02 LAB — COMPREHENSIVE METABOLIC PANEL
ALT: 14 U/L (ref 0–53)
AST: 16 U/L (ref 0–37)
Albumin: 3.9 g/dL (ref 3.5–5.2)
Alkaline Phosphatase: 79 U/L (ref 39–117)
BUN: 17 mg/dL (ref 6–23)
CO2: 29 mEq/L (ref 19–32)
Calcium: 8.9 mg/dL (ref 8.4–10.5)
Chloride: 105 mEq/L (ref 96–112)
Creatinine, Ser: 1.2 mg/dL (ref 0.50–1.35)
Glucose, Bld: 121 mg/dL — ABNORMAL HIGH (ref 70–99)
Potassium: 4.4 mEq/L (ref 3.5–5.3)
Sodium: 141 mEq/L (ref 135–145)
Total Bilirubin: 0.6 mg/dL (ref 0.3–1.2)
Total Protein: 6.5 g/dL (ref 6.0–8.3)

## 2012-07-02 LAB — CBC WITH DIFFERENTIAL/PLATELET
BASO%: 0.7 % (ref 0.0–2.0)
Basophils Absolute: 0 10*3/uL (ref 0.0–0.1)
EOS%: 3.6 % (ref 0.0–7.0)
Eosinophils Absolute: 0.2 10*3/uL (ref 0.0–0.5)
HCT: 28.4 % — ABNORMAL LOW (ref 38.4–49.9)
HGB: 8.4 g/dL — ABNORMAL LOW (ref 13.0–17.1)
LYMPH%: 13.7 % — ABNORMAL LOW (ref 14.0–49.0)
MCH: 20.6 pg — ABNORMAL LOW (ref 27.2–33.4)
MCHC: 29.5 g/dL — ABNORMAL LOW (ref 32.0–36.0)
MCV: 69.7 fL — ABNORMAL LOW (ref 79.3–98.0)
MONO#: 0.6 10*3/uL (ref 0.1–0.9)
MONO%: 10.1 % (ref 0.0–14.0)
NEUT#: 4.1 10*3/uL (ref 1.5–6.5)
NEUT%: 71.9 % (ref 39.0–75.0)
Platelets: 123 10*3/uL — ABNORMAL LOW (ref 140–400)
RBC: 4.08 10*6/uL — ABNORMAL LOW (ref 4.20–5.82)
RDW: 18.5 % — ABNORMAL HIGH (ref 11.0–14.6)
WBC: 5.7 10*3/uL (ref 4.0–10.3)
lymph#: 0.8 10*3/uL — ABNORMAL LOW (ref 0.9–3.3)

## 2012-07-02 LAB — FERRITIN: Ferritin: 8 ng/mL — ABNORMAL LOW (ref 22–322)

## 2012-07-02 LAB — CHCC SMEAR

## 2012-07-02 LAB — IRON AND TIBC
%SAT: 2 % — ABNORMAL LOW (ref 20–55)
Iron: 11 ug/dL — ABNORMAL LOW (ref 42–165)
TIBC: 450 ug/dL — ABNORMAL HIGH (ref 215–435)
UIBC: 439 ug/dL — ABNORMAL HIGH (ref 125–400)

## 2012-07-02 NOTE — Progress Notes (Signed)
Note dictated

## 2012-07-02 NOTE — Progress Notes (Signed)
CC:   Tyrone Calico, MD [name], M.D.  REASON FOR CONSULTATION:  Anemia.  HISTORY OF PRESENT ILLNESS:  [name] is a pleasant 66 year old gentleman referred courtesy of Dr. Rollene Small regarding his anemia.  He is a gentleman native of [place], lived the majority of his life around this area.  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  He is a gentleman with significant comorbidities that include coronary artery disease, ischemic cardiomyopathy as well as multiple medical problems.  The patient was found to be anemic on few occasions and has required packed red cell transfusions in the past.  Most recently his hemoglobin was as low as 9.4 on May 9 of 2013 and he was prescribed oral iron.  Prior to that in March of 2013, his white cell count was 7.2, his hemoglobin was 10, his MCV was 79.  In December 2012 his hemoglobin was actually as high as 12 with a normal MCV.  His most recent iron studies done under the care of Dr. Ronnald Small in May of 2013 showed iron level was 17, ferritin of 5.7, and transferrin 346.  His folic acid was normal, so was his vitamin B12. Clinically he had reported that rather progressive symptoms of weakness, fatigue, tiredness, dyspnea on exertion, weight gain.  A lot of that, however, is multifactorial.  He has been diagnosed with thyroid disease as well.  He had not reported any active bleeding.  He does report some occasional hematochezia.  He has had a colonoscopy about 2 years ago.  REVIEW OF SYSTEMS:  He does not report any headaches, blurry vision, double vision.  He does not report any motor or sensory neuropathy.  He does not report any alteration in mental status.  He does not report any psychiatric issues, depression.  He does not report any fever, chills, sweats.  He does not report any cough, hemoptysis, hematemesis.  No nausea or vomiting, abdominal pain, hematochezia, melena,  or genitourinary complaints.  Rest of the review of systems is unremarkable.  PAST MEDICAL HISTORY:  Significant for ischemic cardiomyopathy, history of sleep apnea, obesity, BPH, AAA and also history of bronchitis.  MEDICATION:  He is on amiodarone, aspirin, carvedilol, Combivent, Lasix, Isordil, ramipril, Aldactone, finasteride, Flomax, Synthroid and Plavix. He is also status post carotid endarterectomy.  ALLERGIES:  Allergic to ACE inhibitors, penicillin and hydrocodone.  SOCIAL HISTORY:  He is married.  He has 2 children.  Denied any alcohol or tobacco abuse.  Currently retired.  Worked as a Engineer, structural and also in Teacher, adult education.  FAMILY HISTORY:  Mother died of lung cancer.  Father had coronary artery disease.  His daughter had cardiomyopathy.  PHYSICAL EXAMINATION:  Alert, awake gentleman, appeared in no active distress.  His blood pressure today is 95/52, pulse is 55, respirations 16, he is afebrile, temperature is 98.5.  He weighs 280 pounds.  He is 5.8 tall with a PSA of 2.29 and ECOG performance status of 1.  HEENT: Head is normocephalic, atraumatic.  Pupils equal, round, reactive to light.  Oral mucosa moist and pink.  Neck:  Supple without lymphadenopathy.  Heart:  Regular rate and rhythm.  S1, S2.  Lungs: Clear to auscultation without rhonchi, wheeze, dullness to percussion. Abdomen:  Soft, nontender.  No hepatosplenomegaly.  Extremities:  No clubbing, cyanosis, or edema.  Neurological:  Intact motor, sensory and deep tendon reflexes.  LABORATORY DATA:  Showed a hemoglobin of 8.4, white cell count 5.7, platelet  count of 123.  His is MCV 69 and his RDW is 18.5.  His peripheral smear was personally reviewed today which showed clear-cut hypochromia and microcytosis and large platelets but no evidence of any schistocytosis.  No red cell fragmentation.  No evidence of any dysplasia.  ASSESSMENT AND PLAN:  A 66 year old gentleman with the following issues: 1. 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  The patient reports that he has not really been     taking any oral iron at this time.  He has been prescribed some     recently but he has not really started taking it.  He does report     some occasional hematochezia, but he had a colonoscopy about 2     years ago.  I discussed the differential diagnosis of his anemia at     this time.  I do not think it is a mystery in terms of the cause at     this point.  He has a clear-cut iron deficiency, but the bigger     question is exactly why is he developing iron-deficiency at this     time.  As mentioned, he had a colonoscopy but probably he will need     a re-evaluation at this time to make sure he has not developed a     hemorrhoidal bleed or diverticular bleed that causes this chronic     iron-deficiency anemia, so I would recommend a GI workup at some     point.  In terms of replacing his iron, I do agree with the oral     iron; however, I think given the profound nature of his iron     deficiency, the fact that he is very asymptomatic, although not     symptomatic enough to require blood transfusion, I would favor an     IV iron infusion at this time.  Risks and benefits of using     Feraheme infusion were discussed today, the complications to     include infusion-related toxicity, allergic reaction, arthralgias,     myalgias.  He understands these risks and benefits and willing to     proceed.  I would like to recheck his iron in about 3 months to     ensure that his iron stores have been fully repleted.  However,     again I continue to encourage a GI workup if that has not been done     recently. 2. Thrombocytopenia.  It is rather minor.  This could be a function     due to his iron deficiency, also to maybe mild platelet clumping.     We will continue to watch  that.    ______________________________ [name], M.D. FNS/MEDQ  D:  07/02/2012  T:  07/02/2012  Job:  [id number]

## 2012-07-02 NOTE — Progress Notes (Signed)
Patient came in today as a new patient with his wife and he has two insurance.The patient wife said that they are [place] as far as financial assistance.

## 2012-07-02 NOTE — Telephone Encounter (Signed)
Gave patient appointment for 07-08-2012 and gave patient appointment for 10-02-2012 starting at 9:30 am printed out calendar and gave patient

## 2012-07-08 ENCOUNTER — Ambulatory Visit (HOSPITAL_BASED_OUTPATIENT_CLINIC_OR_DEPARTMENT_OTHER): Payer: Medicare Other

## 2012-07-08 VITALS — BP 115/53 | HR 63 | Temp 97.1°F

## 2012-07-08 DIAGNOSIS — I10 Essential (primary) hypertension: Secondary | ICD-10-CM

## 2012-07-08 DIAGNOSIS — I714 Abdominal aortic aneurysm, without rupture, unspecified: Secondary | ICD-10-CM

## 2012-07-08 DIAGNOSIS — J449 Chronic obstructive pulmonary disease, unspecified: Secondary | ICD-10-CM

## 2012-07-08 DIAGNOSIS — R7309 Other abnormal glucose: Secondary | ICD-10-CM

## 2012-07-08 DIAGNOSIS — M549 Dorsalgia, unspecified: Secondary | ICD-10-CM

## 2012-07-08 DIAGNOSIS — E039 Hypothyroidism, unspecified: Secondary | ICD-10-CM

## 2012-07-08 DIAGNOSIS — D649 Anemia, unspecified: Secondary | ICD-10-CM

## 2012-07-08 DIAGNOSIS — I2510 Atherosclerotic heart disease of native coronary artery without angina pectoris: Secondary | ICD-10-CM

## 2012-07-08 DIAGNOSIS — J4489 Other specified chronic obstructive pulmonary disease: Secondary | ICD-10-CM

## 2012-07-08 DIAGNOSIS — D509 Iron deficiency anemia, unspecified: Secondary | ICD-10-CM | POA: Diagnosis not present

## 2012-07-08 DIAGNOSIS — Z9989 Dependence on other enabling machines and devices: Secondary | ICD-10-CM

## 2012-07-08 DIAGNOSIS — N401 Enlarged prostate with lower urinary tract symptoms: Secondary | ICD-10-CM

## 2012-07-08 DIAGNOSIS — R609 Edema, unspecified: Secondary | ICD-10-CM

## 2012-07-08 DIAGNOSIS — D510 Vitamin B12 deficiency anemia due to intrinsic factor deficiency: Secondary | ICD-10-CM

## 2012-07-08 DIAGNOSIS — K219 Gastro-esophageal reflux disease without esophagitis: Secondary | ICD-10-CM

## 2012-07-08 DIAGNOSIS — I6523 Occlusion and stenosis of bilateral carotid arteries: Secondary | ICD-10-CM

## 2012-07-08 DIAGNOSIS — R0600 Dyspnea, unspecified: Secondary | ICD-10-CM

## 2012-07-08 DIAGNOSIS — R972 Elevated prostate specific antigen [PSA]: Secondary | ICD-10-CM

## 2012-07-08 DIAGNOSIS — I6529 Occlusion and stenosis of unspecified carotid artery: Secondary | ICD-10-CM

## 2012-07-08 DIAGNOSIS — G4733 Obstructive sleep apnea (adult) (pediatric): Secondary | ICD-10-CM

## 2012-07-08 MED ORDER — SODIUM CHLORIDE 0.9 % IV SOLN
1020.0000 mg | Freq: Once | INTRAVENOUS | Status: AC
  Administered 2012-07-08: 1020 mg via INTRAVENOUS
  Filled 2012-07-08: qty 34

## 2012-07-08 MED ORDER — SODIUM CHLORIDE 0.9 % IV SOLN
Freq: Once | INTRAVENOUS | Status: AC
  Administered 2012-07-08: 14:00:00 via INTRAVENOUS

## 2012-07-08 NOTE — Progress Notes (Signed)
Patient vital signs stable per and post Feraheme; 30 minute post observation done.

## 2012-07-08 NOTE — Patient Instructions (Addendum)
[facility] Discharge Instructions for Patients Receiving Chemotherapy  Today you received the following: Feraheme       If you develop nausea and vomiting that is not controlled by your nausea medication, call the clinic. If it is after clinic hours your family physician or the after hours number for the clinic or go to the Emergency Department.   BELOW ARE SYMPTOMS THAT SHOULD BE REPORTED IMMEDIATELY:  *FEVER GREATER THAN 100.5 F  *CHILLS WITH OR WITHOUT FEVER  NAUSEA AND VOMITING THAT IS NOT CONTROLLED WITH YOUR NAUSEA MEDICATION  *UNUSUAL SHORTNESS OF BREATH  *UNUSUAL BRUISING OR BLEEDING  TENDERNESS IN MOUTH AND THROAT WITH OR WITHOUT PRESENCE OF ULCERS  *URINARY PROBLEMS  *BOWEL PROBLEMS  UNUSUAL RASH Items with * indicate a potential emergency and should be followed up as soon as possible.  One of the nurses will contact you 24 hours after your treatment. Please let the nurse know about any problems that you may have experienced. Feel free to call the clinic you have any questions or concerns. The clinic phone number is [phone].   I have been informed and understand all the instructions given to me. I know to contact the clinic, my physician, or go to the Emergency Department if any problems should occur. I do not have any questions at this time, but understand that I may call the clinic during office hours   should I have any questions or need assistance in obtaining follow up care.    __________________________________________  _____________  __________ Signature of Patient or Authorized Representative            Date                   Time    __________________________________________ Nurse's Signature

## 2012-07-09 ENCOUNTER — Telehealth: Payer: Self-pay | Admitting: *Deleted

## 2012-07-09 NOTE — Telephone Encounter (Signed)
Message copied by Cherylynn Ridges on Tue Jul 09, 2012 11:19 AM ------      Message from: Azzie Glatter      Created: Mon Jul 08, 2012  3:36 PM      Regarding: "1st time Feraheme"      Contact: [phone]       Patient received 1st time Feraheme; tolerated treatment well; 30 minute observation post [facility]

## 2012-07-09 NOTE — Telephone Encounter (Signed)
Message left requesting return call for f/u.  Awaiting return call from patient.

## 2012-07-16 ENCOUNTER — Encounter: Payer: Self-pay | Admitting: Internal Medicine

## 2012-07-16 ENCOUNTER — Ambulatory Visit (INDEPENDENT_AMBULATORY_CARE_PROVIDER_SITE_OTHER): Payer: Medicare Other | Admitting: Internal Medicine

## 2012-07-16 VITALS — BP 102/58 | HR 64 | Ht 68.0 in | Wt 287.0 lb

## 2012-07-16 DIAGNOSIS — R0989 Other specified symptoms and signs involving the circulatory and respiratory systems: Secondary | ICD-10-CM

## 2012-07-16 DIAGNOSIS — J310 Chronic rhinitis: Secondary | ICD-10-CM | POA: Diagnosis not present

## 2012-07-16 DIAGNOSIS — J449 Chronic obstructive pulmonary disease, unspecified: Secondary | ICD-10-CM | POA: Diagnosis not present

## 2012-07-16 DIAGNOSIS — R0609 Other forms of dyspnea: Secondary | ICD-10-CM

## 2012-07-16 DIAGNOSIS — R0600 Dyspnea, unspecified: Secondary | ICD-10-CM

## 2012-07-16 DIAGNOSIS — R609 Edema, unspecified: Secondary | ICD-10-CM

## 2012-07-16 DIAGNOSIS — J4489 Other specified chronic obstructive pulmonary disease: Secondary | ICD-10-CM

## 2012-07-16 LAB — PULMONARY FUNCTION TEST

## 2012-07-16 MED ORDER — FLUTICASONE PROPIONATE 50 MCG/ACT NA SUSP: 2.0000 | Freq: Every day | NASAL | Status: DC

## 2012-07-16 NOTE — Progress Notes (Signed)
PFT done today. 

## 2012-07-16 NOTE — Progress Notes (Signed)
06/03/12- 43 yoM former smoker seen in pulmonary consultation at the request of Dr. Ronnald Ramp. Per Dr. Ronnald Ramp. Patient c/o sob and difficultly breathing when sitting while talking and walking.  Denies chest pain, chest tightness, wheeizng, and cough. Diagnosed with COPD 10 years ago. Wife is here. COPD Assessment Test (CAT) scored 24/40. He had smoked a pack a day half per day until 2005. 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 He was then diagnosed with hypothyroidism. Since then he has not exercised regularly, has felt weak with easier dyspnea on exertion. He denies cough or wheeze. Little change from day to day. His wife says he gets short of breath climbing 10 steps and he has to stop at the top. He has gained weight since thyroid diagnosis and with lack of exercise. He indicates 40 pound weight gain in the past 2 years. Coronary artery disease with history of multiple myocardial infarctions, congestive heart failure, hypertension, implanted AICD in left chest. No history of asthma. Ankles swell. Diagnosed obstructive sleep apnea/Dr. Kelly/CPAP/Advanced, unknown pressure. Anemia 4 years ago required transfusion. Uses Spiriva daily, occasional rescue Combivent./ MI/ AICD, OSA(Dr Kelly)  07/16/12- 66 yoM former smoker followed for dyspnea/ COPD, complicated by hx OSA/ Dr Claiborne Billings, CAD/ CHF/ MI/ AICD Wife is here today Found to be anemic and now treated with iron. Very short of breath over the weekend/blames weather, limiting dyspnea on exertion then but today feels "great". Using Spiriva with rare need for rescue albuterol. Says he is getting bronchitis less often than in the past. COPD assessment test (CAT) score 27/40 Complains of nasal stuffiness supine. Failed nasal strips. Sleeps sitting up because of this and uses saline nasal rinse. 6 minute walk test 07/16/2012: 95%, 97%, 98%, 216 m-ended test labored  for breath and fatigue but without desaturation. CXR 06/10/12 reviewed with them-bronchitis changes, CE/ASVD/CABG IMPRESSION:  1. Diffuse peribronchial cuffing. Given the patient's history of  prior smoking, this may suggest chronic bronchitis.  2. In addition, there is mild cardiomegaly with pulmonary venous  congestion, but no frank pulmonary edema at this time.  3. Status post median sternotomy for CABG.  4. Atherosclerosis.  Original Report Authenticated By: Etheleen Mayhew, M.D.  PFT: 07/16/2012-moderate to severe obstructive airways disease with air trapping, diffusion mildly reduced. FEV1/FVC 0.49. Insignificant response to bronchodilator. RV 128%, DLCO 69%. This  ROS-see HPI Constitutional:   No-   weight loss, night sweats, fevers, chills, fatigue, lassitude. HEENT:   No-  headaches, difficulty swallowing, tooth/dental problems, sore throat,       No-  sneezing, itching, ear ache, +nasal congestion, post nasal drip,  CV:  No-   chest pain, +orthopnea, PND, +swelling in lower extremities, No-anasarca, dizziness, palpitations Resp: + shortness of breath with exertion or at rest.              No-   productive cough,  No non-productive cough,  No- coughing up of blood.              No-   change in color of mucus.  No- wheezing.   Skin: No-   rash or lesions. GI:  No-   heartburn, indigestion, abdominal pain, nausea, vomiting, GU: . MS:  No-   joint pain or swelling.  Neuro-     nothing unusual Psych:  No- change in mood or affect. No depression or anxiety.  No memory loss.  OBJ- Physical  Exam BP 102/58  Pulse 64  Ht 5\' 8"  (1.727 m)  Wt 287 lb (130.182 kg)  BMI 43.64 kg/m2  SpO2 94%  General- Alert, Oriented, Affect-appropriate, Distress- none acute, obese, talkative Skin- rash-none, lesions- none, excoriation- none Lymphadenopathy- none Head- atraumatic            Eyes- Gross vision intact, PERRLA, conjunctivae and secretions clear            Ears- Hearing,  canals-normal            Nose- +turbinate edema, no-Septal dev, mucus, polyps, erosion, perforation             Throat- Mallampati III, mucosa clear , drainage- none, tonsils- atrophic Neck- flexible , trachea midline, no stridor , thyroid nl, carotid no bruit Chest - symmetrical excursion , unlabored           Heart/CV- RRR , no murmur , no gallop  , no rub, nl s1 s2                           - JVD- none , edema 1+, stasis changes- none, varices- none           Lung- clear to P&A, rales- none, wheeze- none, cough- none , dullness-none, rub- none, raspy last           Chest wall- sternotomy scar Abd- tender-no, distended-no, bowel sounds-present, HSM- no Br/ Gen/ Rectal- Not done, not indicated Extrem- cyanosis- none, clubbing, none, atrophy- none, strength- nl, vein donor scars Neuro- head bob tremor

## 2012-07-16 NOTE — Patient Instructions (Addendum)
Weight loss would help your breathing more than any medicines.  Script sent for Flonase/ fluticasone  Continue Spiriva  Please call as needed

## 2012-07-17 ENCOUNTER — Other Ambulatory Visit: Payer: Self-pay | Admitting: Internal Medicine

## 2012-07-19 ENCOUNTER — Ambulatory Visit (INDEPENDENT_AMBULATORY_CARE_PROVIDER_SITE_OTHER): Payer: Medicare Other | Admitting: Internal Medicine

## 2012-07-19 ENCOUNTER — Encounter: Payer: Self-pay | Admitting: Internal Medicine

## 2012-07-19 VITALS — BP 104/52 | HR 64 | Temp 98.5°F | Resp 16 | Wt 281.2 lb

## 2012-07-19 DIAGNOSIS — K219 Gastro-esophageal reflux disease without esophagitis: Secondary | ICD-10-CM | POA: Diagnosis not present

## 2012-07-19 DIAGNOSIS — E039 Hypothyroidism, unspecified: Secondary | ICD-10-CM | POA: Diagnosis not present

## 2012-07-19 DIAGNOSIS — R609 Edema, unspecified: Secondary | ICD-10-CM

## 2012-07-19 DIAGNOSIS — K921 Melena: Secondary | ICD-10-CM

## 2012-07-19 DIAGNOSIS — D509 Iron deficiency anemia, unspecified: Secondary | ICD-10-CM

## 2012-07-19 LAB — FECAL OCCULT BLOOD, GUAIAC: Fecal Occult Blood: POSITIVE

## 2012-07-19 MED ORDER — LEVOTHYROXINE SODIUM 50 MCG PO TABS: 50.0000 ug | ORAL_TABLET | Freq: Every day | ORAL | Status: DC

## 2012-07-19 MED ORDER — RANITIDINE HCL 300 MG PO TABS: 300.0000 mg | ORAL_TABLET | Freq: Every day | ORAL | Status: DC

## 2012-07-19 NOTE — Assessment & Plan Note (Signed)
GI referral

## 2012-07-19 NOTE — Assessment & Plan Note (Signed)
He needs a higher synthroid dose

## 2012-07-19 NOTE — Assessment & Plan Note (Signed)
He has significant UGI symptoms and blood in stool so I have asked him to start zantac (I avoided PP's b/c he is on plavix) and have referred him to GI to see if he needs upper endoscopy

## 2012-07-19 NOTE — Assessment & Plan Note (Signed)
He feels much better after recently getting an iron infusion, I am concerned he may have a GI source of blood loss so I have referred him to GI

## 2012-07-19 NOTE — Progress Notes (Signed)
Subjective:    Patient ID: [name], male    DOB: [birth year], 66 y.o.   MRN: [id number]  Gastrophageal Reflux He complains of dysphagia, early satiety, globus sensation and heartburn. He reports no abdominal pain, no belching, no chest pain, no choking, no coughing, no hoarse voice, no nausea, no sore throat, no stridor, no tooth decay, no water brash or no wheezing. This is a chronic problem. The current episode started more than 1 year ago. The problem occurs frequently. The problem has been gradually worsening. The heartburn does not wake him from sleep. The heartburn does not limit his activity. The heartburn doesn't change with position. Associated symptoms include fatigue and melena. Pertinent negatives include no muscle weakness, orthopnea or weight loss. He has tried an antacid (tums) for the symptoms. The treatment provided mild relief.  Thyroid Problem Presents for follow-up visit. Symptoms include fatigue and leg swelling. Patient reports no anxiety, cold intolerance, constipation, depressed mood, diaphoresis, diarrhea, dry skin, hair loss, heat intolerance, hoarse voice, palpitations, tremors, visual change, weight gain or weight loss. The symptoms have been stable.      Review of Systems  Constitutional: Positive for fatigue. Negative for fever, chills, weight loss, weight gain, diaphoresis, activity change, appetite change and unexpected weight change.  HENT: Negative.  Negative for sore throat and hoarse voice.   Eyes: Negative.   Respiratory: Positive for shortness of breath. Negative for cough, choking, chest tightness, wheezing and stridor.   Cardiovascular: Positive for leg swelling. Negative for chest pain and palpitations.  Gastrointestinal: Positive for heartburn, dysphagia, blood in stool and melena. Negative for nausea, vomiting, abdominal pain, diarrhea, constipation, abdominal distention, anal bleeding and rectal pain.  Genitourinary: Negative.  Negative for  frequency and hematuria.  Musculoskeletal: Negative.  Negative for muscle weakness.  Skin: Negative for color change, pallor, rash and wound.  Neurological: Positive for weakness and light-headedness. Negative for dizziness, tremors, seizures and headaches.  Hematological: Negative for cold intolerance, heat intolerance and adenopathy. Does not bruise/bleed easily.  Psychiatric/Behavioral: Negative.        Objective:   Physical Exam  Vitals reviewed. Constitutional: He appears well-developed and well-nourished. He does not appear ill. No distress.  HENT:  Head: Normocephalic and atraumatic.  Mouth/Throat: Oropharynx is clear and moist. No oropharyngeal exudate.  Eyes: Conjunctivae are normal. Right eye exhibits no discharge. Left eye exhibits no discharge. No scleral icterus.  Neck: Normal range of motion. Neck supple. No JVD present. No tracheal deviation present. No thyromegaly present.  Cardiovascular: Normal rate, regular rhythm, normal heart sounds and intact distal pulses.  Exam reveals no gallop and no friction rub.   No murmur heard. Pulmonary/Chest: Effort normal and breath sounds normal. No stridor. No respiratory distress. He has no wheezes. He has no rales. He exhibits no tenderness.  Abdominal: Soft. Bowel sounds are normal. He exhibits no distension and no mass. There is no tenderness. There is no rebound and no guarding.  Genitourinary: Prostate normal. Rectal exam shows no external hemorrhoid, no internal hemorrhoid, no fissure, no mass, no tenderness and anal tone normal. Guaiac positive stool. Prostate is not enlarged and not tender.  Musculoskeletal: Normal range of motion. He exhibits edema (2+ edema in BLE). He exhibits no tenderness.  Lymphadenopathy:    He has no cervical adenopathy.  Skin: Skin is warm and dry. No rash noted. He is not diaphoretic. No erythema. No pallor.  Psychiatric: He has a normal mood and affect. His behavior is normal. Judgment and thought  content normal.      Lab Results  Component Value Date   WBC 5.7 07/02/2012   HGB 8.4* 07/02/2012   HCT 28.4* 07/02/2012   PLT 123* 07/02/2012   GLUCOSE 121* 07/02/2012   CHOL 118 04/18/2012   TRIG 51.0 04/18/2012   HDL 51.20 04/18/2012   LDLCALC 57 04/18/2012   ALT 14 07/02/2012   AST 16 07/02/2012   NA 141 07/02/2012   K 4.4 07/02/2012   CL 105 07/02/2012   CREATININE 1.20 07/02/2012   BUN 17 07/02/2012   CO2 29 07/02/2012   TSH 5.77* 04/18/2012   PSA 0.51 04/18/2012   INR 1.02 01/03/2012   HGBA1C 6.4 04/18/2012      Assessment & Plan:

## 2012-07-19 NOTE — Assessment & Plan Note (Signed)
He will restart Lasix

## 2012-07-19 NOTE — Patient Instructions (Signed)
Hypothyroidism The thyroid is a large gland located in the lower front of your neck. The thyroid gland helps control metabolism. Metabolism is how your body handles food. It controls metabolism with the hormone thyroxine. When this gland is underactive (hypothyroid), it produces too little hormone.  CAUSES These include:   Absence or destruction of thyroid tissue.   Goiter due to iodine deficiency.   Goiter due to medications.   Congenital defects (since birth).   Problems with the pituitary. This causes a lack of TSH (thyroid stimulating hormone). This hormone tells the thyroid to turn out more hormone.  SYMPTOMS  Lethargy (feeling as though you have no energy)   Cold intolerance   Weight gain (in spite of normal food intake)   Dry skin   Coarse hair   Menstrual irregularity (if severe, may lead to infertility)   Slowing of thought processes  Cardiac problems are also caused by insufficient amounts of thyroid hormone. Hypothyroidism in the newborn is cretinism, and is an extreme form. It is important that this form be treated adequately and immediately or it will lead rapidly to retarded physical and mental development. DIAGNOSIS  To prove hypothyroidism, your caregiver may do blood tests and ultrasound tests. Sometimes the signs are hidden. It may be necessary for your caregiver to watch this illness with blood tests either before or after diagnosis and treatment. TREATMENT  Low levels of thyroid hormone are increased by using synthetic thyroid hormone. This is a safe, effective treatment. It usually takes about four weeks to gain the full effects of the medication. After you have the full effect of the medication, it will generally take another four weeks for problems to leave. Your caregiver may start you on low doses. If you have had heart problems the dose may be gradually increased. It is generally not an emergency to get rapidly to normal. HOME CARE INSTRUCTIONS   Take  your medications as your caregiver suggests. Let your caregiver know of any medications you are taking or start taking. Your caregiver will help you with dosage schedules.   As your condition improves, your dosage needs may increase. It will be necessary to have continuing blood tests as suggested by your caregiver.   Report all suspected medication side effects to your caregiver.  SEEK MEDICAL CARE IF: Seek medical care if you develop:  Sweating.   Tremulousness (tremors).   Anxiety.   Rapid weight loss.   Heat intolerance.   Emotional swings.   Diarrhea.   Weakness.  SEEK IMMEDIATE MEDICAL CARE IF:  You develop chest pain, an irregular heart beat (palpitations), or a rapid heart beat. MAKE SURE YOU:   Understand these instructions.   Will watch your condition.   Will get help right away if you are not doing well or get worse.  Document Released: 11/27/2005 Document Revised: 11/16/2011 Document Reviewed: 07/17/2008 [facility] Patient Information 2012 [place].Gastroesophageal Reflux Disease, Adult Gastroesophageal reflux disease (GERD) happens when acid from your stomach flows up into the esophagus. When acid comes in contact with the esophagus, the acid causes soreness (inflammation) in the esophagus. Over time, GERD may create small holes (ulcers) in the lining of the esophagus. CAUSES   Increased body weight. This puts pressure on the stomach, making acid rise from the stomach into the esophagus.   Smoking. This increases acid production in the stomach.   Drinking alcohol. This causes decreased pressure in the lower esophageal sphincter (valve or ring of muscle between the esophagus and stomach), allowing  acid from the stomach into the esophagus.   Late evening meals and a full stomach. This increases pressure and acid production in the stomach.   A malformed lower esophageal sphincter.  Sometimes, no cause is found. SYMPTOMS   Burning pain in the lower part  of the mid-chest behind the breastbone and in the mid-stomach area. This may occur twice a week or more often.   Trouble swallowing.   Sore throat.   Dry cough.   Asthma-like symptoms including chest tightness, shortness of breath, or wheezing.  DIAGNOSIS  Your caregiver may be able to diagnose GERD based on your symptoms. In some cases, X-rays and other tests may be done to check for complications or to check the condition of your stomach and esophagus. TREATMENT  Your caregiver may recommend over-the-counter or prescription medicines to help decrease acid production. Ask your caregiver before starting or adding any new medicines.  HOME CARE INSTRUCTIONS   Change the factors that you can control. Ask your caregiver for guidance concerning weight loss, quitting smoking, and alcohol consumption.   Avoid foods and drinks that make your symptoms worse, such as:   Caffeine or alcoholic drinks.   Chocolate.   Peppermint or mint flavorings.   Garlic and onions.   Spicy foods.   Citrus fruits, such as oranges, lemons, or limes.   Tomato-based foods such as sauce, chili, salsa, and pizza.   Fried and fatty foods.   Avoid lying down for the 3 hours prior to your bedtime or prior to taking a nap.   Eat small, frequent meals instead of large meals.   Wear loose-fitting clothing. Do not wear anything tight around your waist that causes pressure on your stomach.   Raise the head of your bed 6 to 8 inches with wood blocks to help you sleep. Extra pillows will not help.   Only take over-the-counter or prescription medicines for pain, discomfort, or fever as directed by your caregiver.   Do not take aspirin, ibuprofen, or other nonsteroidal anti-inflammatory drugs (NSAIDs).  SEEK IMMEDIATE MEDICAL CARE IF:   You have pain in your arms, neck, jaw, teeth, or back.   Your pain increases or changes in intensity or duration.   You develop nausea, vomiting, or sweating (diaphoresis).     You develop shortness of breath, or you faint.   Your vomit is green, yellow, black, or looks like coffee grounds or blood.   Your stool is red, bloody, or black.  These symptoms could be signs of other problems, such as heart disease, gastric bleeding, or esophageal bleeding. MAKE SURE YOU:   Understand these instructions.   Will watch your condition.   Will get help right away if you are not doing well or get worse.  Document Released: 09/06/2005 Document Revised: 11/16/2011 Document Reviewed: 06/16/2011 [facility] Patient Information 2012 [place].

## 2012-07-21 DIAGNOSIS — J310 Chronic rhinitis: Secondary | ICD-10-CM | POA: Insufficient documentation

## 2012-07-21 NOTE — Assessment & Plan Note (Signed)
Peripheral edema may reflect heart disease and cor pulmonale but is aggravated because he sleeps in a recliner to deal with nasal congestion.

## 2012-07-21 NOTE — Progress Notes (Signed)
Documentation for 6 minute walk test 

## 2012-07-21 NOTE — Assessment & Plan Note (Signed)
Nonspecific rhinitis. Plan-fluticasone nasal spray with education.

## 2012-07-21 NOTE — Assessment & Plan Note (Signed)
Moderately severe COPD but dyspnea also reflects congestive heart failure/borderline edema on imaging, obesity hypoventilation and deconditioning.

## 2012-08-14 ENCOUNTER — Encounter: Payer: Self-pay | Admitting: Gastroenterology

## 2012-08-14 ENCOUNTER — Ambulatory Visit (INDEPENDENT_AMBULATORY_CARE_PROVIDER_SITE_OTHER): Payer: Medicare Other | Admitting: Gastroenterology

## 2012-08-14 VITALS — BP 110/48 | HR 64 | Ht 67.25 in | Wt 281.1 lb

## 2012-08-14 DIAGNOSIS — K921 Melena: Secondary | ICD-10-CM

## 2012-08-14 DIAGNOSIS — D509 Iron deficiency anemia, unspecified: Secondary | ICD-10-CM | POA: Diagnosis not present

## 2012-08-14 DIAGNOSIS — R195 Other fecal abnormalities: Secondary | ICD-10-CM | POA: Diagnosis not present

## 2012-08-14 NOTE — Progress Notes (Signed)
History of Present Illness: This is a 66 year old male with a long history of recurrent iron deficiency anemia. He notes intermittent small-volume hematochezia and Hemoccult positive stool with documented on recent rectal exam. Previously underwent enteroscopy and colonoscopy in 2010 showing a small colon polyps, internal hemorrhoids. Biopsies of the duodenum were normal in 2003. 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 Prior to that he underwent upper endoscopy and colonoscopy in November 2003 showing only internal hemorrhoids. Has recently received intravenous iron. His recent blood work was reviewed showing a significant microcytic anemia and iron studies showing iron deficiency. Denies weight loss, abdominal pain, constipation, diarrhea, change in stool caliber, melena, hematochezia, nausea, vomiting, dysphagia, reflux symptoms, chest pain.  Review of Systems: Pertinent positive and negative review of systems were noted in the above HPI section. All other review of systems were otherwise negative.  Current Medications, Allergies, Past Medical History, Past Surgical History, Family History and Social History were reviewed in [employer] record.  Physical Exam: General: Well developed , well nourished, obese, no acute distress Head: Normocephalic and atraumatic Eyes:  sclerae anicteric, EOMI Ears: Normal auditory acuity Mouth: No deformity or lesions Neck: Supple, no masses or thyromegaly Lungs: Clear throughout to auscultation Heart: Regular rate and rhythm; no murmurs, rubs or bruits Abdomen: Soft, non tender and non distended. No masses, hepatosplenomegaly or hernias noted. Normal Bowel sounds Rectal: Deferred with DRE performed by Dr. Ronnald Ramp less than one month ago showing no lesions and occult positive stool. Musculoskeletal: Symmetrical with no gross deformities  Skin: No lesions on visible extremities Pulses:  Normal pulses  noted Extremities: No clubbing, cyanosis, or deformities noted. 2+ pedal and pretibial edema Neurological: Alert oriented x 4, grossly nonfocal Cervical Nodes:  No significant cervical adenopathy Inguinal Nodes: No significant inguinal adenopathy Psychological:  Alert and cooperative. Normal mood and affect  Assessment and Recommendations:  1. Iron deficiency anemia has been a recurrent problem for the patient and is secondary to small bowel AVMs. He underwent upper endoscopy and colonoscopy most recently 3 years ago. Iron replacement and ongoing monitoring per Drs. Ronnald Ramp and [place]. He has multiple comorbidities which increases his risk for endoscopic procedures and sedation. I offered him the option to repeat his colonoscopy and upper endoscopy at this time however they would be low yield. He prefers not to proceed given his evaluation 3 years ago and his comorbidities. I think this is a very reasonable decision.  2. Small volume hematochezia and Hemoccult-positive stool likely secondary to internal hemorrhoids however Hemoccult positive stool could be from AVMs. Preparation H suppositories daily as needed for hemorrhoidal symptoms. Maintain a high fiber diet with adequate water intake.  3. COPD  4. OSA maintained on CPAP  5. CHF   6. Personal history of adenomatous colon polyps. Surveillance colonoscopy not due for 2 more years his health status is appropriate for surveillance.   7. Obesity, BMI 43.7

## 2012-08-14 NOTE — Patient Instructions (Addendum)
You will be due for a recall colonoscopy in 04/2014. We will send you a reminder in the mail when it gets closer to that time.  cc: Zola Button, MD       [name], MD

## 2012-08-20 DIAGNOSIS — R5383 Other fatigue: Secondary | ICD-10-CM | POA: Diagnosis not present

## 2012-08-20 DIAGNOSIS — I447 Left bundle-branch block, unspecified: Secondary | ICD-10-CM | POA: Diagnosis not present

## 2012-08-20 DIAGNOSIS — I359 Nonrheumatic aortic valve disorder, unspecified: Secondary | ICD-10-CM | POA: Diagnosis not present

## 2012-08-20 DIAGNOSIS — R5381 Other malaise: Secondary | ICD-10-CM | POA: Diagnosis not present

## 2012-08-20 DIAGNOSIS — R6889 Other general symptoms and signs: Secondary | ICD-10-CM | POA: Diagnosis not present

## 2012-08-20 DIAGNOSIS — I2510 Atherosclerotic heart disease of native coronary artery without angina pectoris: Secondary | ICD-10-CM | POA: Diagnosis not present

## 2012-09-10 HISTORY — PX: NM MYOCAR PERF WALL MOTION: HXRAD629

## 2012-09-23 DIAGNOSIS — I4891 Unspecified atrial fibrillation: Secondary | ICD-10-CM | POA: Diagnosis not present

## 2012-09-23 DIAGNOSIS — I509 Heart failure, unspecified: Secondary | ICD-10-CM | POA: Diagnosis not present

## 2012-09-26 DIAGNOSIS — I739 Peripheral vascular disease, unspecified: Secondary | ICD-10-CM | POA: Diagnosis not present

## 2012-09-26 DIAGNOSIS — I701 Atherosclerosis of renal artery: Secondary | ICD-10-CM | POA: Diagnosis not present

## 2012-09-26 DIAGNOSIS — R0609 Other forms of dyspnea: Secondary | ICD-10-CM | POA: Diagnosis not present

## 2012-09-26 DIAGNOSIS — R0989 Other specified symptoms and signs involving the circulatory and respiratory systems: Secondary | ICD-10-CM | POA: Diagnosis not present

## 2012-09-26 DIAGNOSIS — I10 Essential (primary) hypertension: Secondary | ICD-10-CM | POA: Diagnosis not present

## 2012-09-26 DIAGNOSIS — I2510 Atherosclerotic heart disease of native coronary artery without angina pectoris: Secondary | ICD-10-CM | POA: Diagnosis not present

## 2012-10-02 ENCOUNTER — Ambulatory Visit (HOSPITAL_BASED_OUTPATIENT_CLINIC_OR_DEPARTMENT_OTHER): Payer: Medicare Other | Admitting: Oncology

## 2012-10-02 ENCOUNTER — Telehealth: Payer: Self-pay | Admitting: Oncology

## 2012-10-02 ENCOUNTER — Other Ambulatory Visit (HOSPITAL_BASED_OUTPATIENT_CLINIC_OR_DEPARTMENT_OTHER): Payer: Medicare Other | Admitting: Lab

## 2012-10-02 VITALS — BP 104/58 | HR 64 | Temp 97.4°F | Wt 279.2 lb

## 2012-10-02 DIAGNOSIS — K922 Gastrointestinal hemorrhage, unspecified: Secondary | ICD-10-CM | POA: Diagnosis not present

## 2012-10-02 DIAGNOSIS — D509 Iron deficiency anemia, unspecified: Secondary | ICD-10-CM | POA: Diagnosis not present

## 2012-10-02 DIAGNOSIS — D508 Other iron deficiency anemias: Secondary | ICD-10-CM | POA: Diagnosis not present

## 2012-10-02 DIAGNOSIS — D649 Anemia, unspecified: Secondary | ICD-10-CM

## 2012-10-02 LAB — CBC WITH DIFFERENTIAL/PLATELET
BASO%: 0.5 % (ref 0.0–2.0)
Basophils Absolute: 0 10*3/uL (ref 0.0–0.1)
EOS%: 4.6 % (ref 0.0–7.0)
Eosinophils Absolute: 0.3 10*3/uL (ref 0.0–0.5)
HCT: 41 % (ref 38.4–49.9)
HGB: 14 g/dL (ref 13.0–17.1)
LYMPH%: 11.8 % — ABNORMAL LOW (ref 14.0–49.0)
MCH: 30 pg (ref 27.2–33.4)
MCHC: 34.2 g/dL (ref 32.0–36.0)
MCV: 87.6 fL (ref 79.3–98.0)
MONO#: 0.8 10*3/uL (ref 0.1–0.9)
MONO%: 12.3 % (ref 0.0–14.0)
NEUT#: 4.8 10*3/uL (ref 1.5–6.5)
NEUT%: 70.8 % (ref 39.0–75.0)
Platelets: 161 10*3/uL (ref 140–400)
RBC: 4.68 10*6/uL (ref 4.20–5.82)
RDW: 15.2 % — ABNORMAL HIGH (ref 11.0–14.6)
WBC: 6.8 10*3/uL (ref 4.0–10.3)
lymph#: 0.8 10*3/uL — ABNORMAL LOW (ref 0.9–3.3)

## 2012-10-02 LAB — COMPREHENSIVE METABOLIC PANEL (CC13)
ALT: 23 U/L (ref 0–55)
AST: 21 U/L (ref 5–34)
Albumin: 3.9 g/dL (ref 3.5–5.0)
Alkaline Phosphatase: 110 U/L (ref 40–150)
BUN: 32 mg/dL — ABNORMAL HIGH (ref 7.0–26.0)
CO2: 29 mEq/L (ref 22–29)
Calcium: 9.5 mg/dL (ref 8.4–10.4)
Chloride: 94 mEq/L — ABNORMAL LOW (ref 98–107)
Creatinine: 1.3 mg/dL (ref 0.7–1.3)
Glucose: 190 mg/dl — ABNORMAL HIGH (ref 70–99)
Potassium: 3.7 mEq/L (ref 3.5–5.1)
Sodium: 136 mEq/L (ref 136–145)
Total Bilirubin: 1 mg/dL (ref 0.20–1.20)
Total Protein: 6.7 g/dL (ref 6.4–8.3)

## 2012-10-02 LAB — IRON AND TIBC
%SAT: 23 % (ref 20–55)
Iron: 87 ug/dL (ref 42–165)
TIBC: 372 ug/dL (ref 215–435)
UIBC: 285 ug/dL (ref 125–400)

## 2012-10-02 LAB — FERRITIN: Ferritin: 15 ng/mL — ABNORMAL LOW (ref 22–322)

## 2012-10-02 NOTE — Progress Notes (Signed)
Hematology and Oncology Follow Up Visit  [name] [id number] 10/26/1946 66 y.o. 10/02/2012 10:04 AM   Principle Diagnosis: 66 year old with iron deficiency anemia diagnosed in 06/2012. This is likely due to GI  Loss from AVMs   Prior Therapy: S/P Feraheme in 06/2012  Interim History:  [name] presents today for a follow up visit. He tolerated IV iron without complications. He felt a lot better. His energy improved within 48 hours.  No complications from the infusion at that time. Clinically he had not reported any progressive symptoms of weakness, fatigue, tiredness, dyspnea on exertion, weight gain. He had not reported any active bleeding. He does report some occasional hematochezia.   Medications: I have reviewed the patient's current medications. Current outpatient prescriptions:amiodarone (CORDARONE) 200 MG tablet, Take 200 mg by mouth daily. , Disp: , Rfl: ;  aspirin 81 MG tablet, Take 81 mg by mouth daily., Disp: , Rfl: ;  atorvastatin (LIPITOR) 40 MG tablet, Take 40 mg by mouth daily.  , Disp: , Rfl: ;  CALCIUM PO, Take 1 tablet by mouth daily., Disp: , Rfl: ;  carvedilol (COREG) 25 MG tablet, Take 25 mg by mouth 2 (two) times daily with a meal.  , Disp: , Rfl:  cholecalciferol (VITAMIN D) 1000 UNITS tablet, Take 1,000 Units by mouth daily., Disp: , Rfl: ;  clopidogrel (PLAVIX) 75 MG tablet, Take 75 mg by mouth daily.  , Disp: , Rfl: ;  COMBIVENT 18-103 MCG/ACT inhaler, , Disp: , Rfl: ;  finasteride (PROSCAR) 5 MG tablet, Take 5 mg by mouth daily., Disp: , Rfl: ;  fish oil-omega-3 fatty acids 1000 MG capsule, Take 2 g by mouth daily.  , Disp: , Rfl:  fluticasone (FLONASE) 50 MCG/ACT nasal spray, Place 2 sprays into the nose daily. At bedtime, Disp: 16 g, Rfl: prn;  furosemide (LASIX) 80 MG tablet, TAKE 1 TABLET (80 MG TOTAL) BY MOUTH 2 (TWO) TIMES DAILY., Disp: 60 tablet, Rfl: 2;  isosorbide mononitrate (IMDUR) 60 MG 24 hr tablet, Take 1 tablet every am and 0.5 tablet at bedtime, Disp:  , Rfl:  levothyroxine (SYNTHROID, LEVOTHROID) 50 MCG tablet, Take 1 tablet (50 mcg total) by mouth daily., Disp: 90 tablet, Rfl: 1;  Multiple Vitamins-Minerals (MENS 50+ MULTI VITAMIN/MIN) TABS, Take by mouth daily., Disp: , Rfl: ;  multivitamin-lutein (OCUVITE-LUTEIN) CAPS, Take 1 capsule by mouth daily.  , Disp: , Rfl: ;  nitroGLYCERIN (NITROSTAT) 0.4 MG SL tablet, Place 0.4 mg under the tongue every 5 (five) minutes as needed.  , Disp: , Rfl:  ramipril (ALTACE) 2.5 MG tablet, Take 2 capsule every morning Take 1 capsule every evening, Disp: , Rfl: ;  ranitidine (ZANTAC) 300 MG tablet, Take 1 tablet (300 mg total) by mouth at bedtime., Disp: 90 tablet, Rfl: 3;  spironolactone (ALDACTONE) 25 MG tablet, Take 25 mg by mouth daily.  , Disp: , Rfl: ;  Tamsulosin HCl (FLOMAX) 0.4 MG CAPS, Take 0.4 mg by mouth daily., Disp: , Rfl:  tiotropium (SPIRIVA HANDIHALER) 18 MCG inhalation capsule, Place 1 capsule (18 mcg total) into inhaler and inhale daily., Disp: 30 capsule, Rfl: 12  Allergies:  Allergies  Allergen Reactions  . Ace Inhibitors     REACTION: cough  . Penicillins Other (See Comments)    "childhood allergy"  . Tussionex Pennkinetic Er (Hydrocod Polst-Cpm Polst Er) Other (See Comments)    "caused prostate problems"    Past Medical History, Surgical history, Social history, and Family History were reviewed and  updated.  Review of Systems: Constitutional:  Negative for fever, chills, night sweats, anorexia, weight loss, pain. Cardiovascular: no chest pain or dyspnea on exertion Respiratory: no cough, shortness of breath, or wheezing Neurological: negative Dermatological: negative ENT: negative Skin: Negative. Gastrointestinal: negative Genito-Urinary: negative Hematological and Lymphatic: negative Breast: negative Musculoskeletal: negative Remaining ROS negative. Physical Exam: Blood pressure 104/58, pulse 64, temperature 97.4 F (36.3 C), temperature source Oral, weight 279 lb 3 oz  (126.639 kg). ECOG: 1 General appearance: alert Head: Normocephalic, without obvious abnormality, atraumatic Neck: no JVD, supple, symmetrical, trachea midline and thyroid not enlarged, symmetric, no tenderness/mass/nodules Lymph nodes: Cervical, supraclavicular, and axillary nodes normal. Heart:regular rate and rhythm, S1, S2 normal, no murmur, click, rub or gallop Lung:chest clear, no wheezing, rales, normal symmetric air entry Abdomin: soft, non-tender, without masses or organomegaly EXT:no erythema, induration, or nodules   Lab Results: Lab Results  Component Value Date   WBC 6.8 10/02/2012   HGB 14.0 10/02/2012   HCT 41.0 10/02/2012   MCV 87.6 10/02/2012   PLT 161 10/02/2012     Chemistry      Component Value Date/Time   NA 141 07/02/2012 1328   K 4.4 07/02/2012 1328   CL 105 07/02/2012 1328   CO2 29 07/02/2012 1328   BUN 17 07/02/2012 1328   CREATININE 1.20 07/02/2012 1328      Component Value Date/Time   CALCIUM 8.9 07/02/2012 1328   ALKPHOS 79 07/02/2012 1328   AST 16 07/02/2012 1328   ALT 14 07/02/2012 1328   BILITOT 0.6 07/02/2012 1328       Impression and Plan:  A 66 year old gentleman with the following issues:  1. Microcytic hypochromic anemia with a documented iron deficiency anemia at least in May of 2013. This has resolved after IV iron. The plan is to continue to follow him every three months and replace his Fe as needed.   2. GI blood loss: he is following with GI for that.   3. Follow up: in three months.    [facility], MD 10/23/201310:04 AM

## 2012-10-02 NOTE — Telephone Encounter (Signed)
gv and printed appt schedule for Jan 2014

## 2012-10-05 ENCOUNTER — Other Ambulatory Visit: Payer: Self-pay | Admitting: Internal Medicine

## 2012-10-17 DIAGNOSIS — Z23 Encounter for immunization: Secondary | ICD-10-CM | POA: Diagnosis not present

## 2012-10-25 ENCOUNTER — Ambulatory Visit: Payer: Medicare Other | Admitting: Vascular Surgery

## 2012-10-30 ENCOUNTER — Encounter: Payer: Self-pay | Admitting: Neurosurgery

## 2012-10-31 ENCOUNTER — Ambulatory Visit (INDEPENDENT_AMBULATORY_CARE_PROVIDER_SITE_OTHER): Payer: Medicare Other | Admitting: Neurosurgery

## 2012-10-31 ENCOUNTER — Encounter (INDEPENDENT_AMBULATORY_CARE_PROVIDER_SITE_OTHER): Payer: Medicare Other | Admitting: *Deleted

## 2012-10-31 ENCOUNTER — Encounter: Payer: Self-pay | Admitting: Neurosurgery

## 2012-10-31 VITALS — BP 102/57 | HR 61 | Resp 18 | Ht 68.0 in | Wt 287.7 lb

## 2012-10-31 DIAGNOSIS — Z48812 Encounter for surgical aftercare following surgery on the circulatory system: Secondary | ICD-10-CM

## 2012-10-31 DIAGNOSIS — I714 Abdominal aortic aneurysm, without rupture: Secondary | ICD-10-CM

## 2012-10-31 DIAGNOSIS — I6529 Occlusion and stenosis of unspecified carotid artery: Secondary | ICD-10-CM

## 2012-10-31 NOTE — Progress Notes (Signed)
[facility] [place] AAA/PAD/PVD Office Note  CC: AAA surveillance Referring Physician: Bridgett Larsson  History of Present Illness: 66 year old male patient of Dr. Bridgett Larsson followed for known AAA. The patient denies any unusual abdominal or back pain. The patient does have multiple medical problems but reports no new recent medical diagnoses.  Past Medical History  Diagnosis Date  . Myocardial infarction   . Hypertension   . CHF (congestive heart failure)   . COPD (chronic obstructive pulmonary disease)   . GERD (gastroesophageal reflux disease)   . AAA (abdominal aortic aneurysm)   . Tremor   . Sleep apnea   . Dysrhythmia     ICD-defibrillator  . Diabetes mellitus     DIET CONTROLLED  . Carotid artery stenosis   . Peripheral vascular disease   . Adenomatous colon polyp 01/2004  . Diverticulosis   . Anemia   . AVM (arteriovenous malformation)   . CAD (coronary artery disease)   . HLD (hyperlipidemia)   . Hypothyroidism   . BPH (benign prostatic hypertrophy)   . Fatigue     ROS: [x]  Positive   [ ]  Denies    General: [ ]  Weight loss, [ ]  Fever, [ ]  chills Neurologic: [ ]  Dizziness, [ ]  Blackouts, [ ]  Seizure [ ]  Stroke, [ ]  "Mini stroke", [ ]  Slurred speech, [ ]  Temporary blindness; [ ]  weakness in arms or legs, [ ]  Hoarseness Cardiac: [ ]  Chest pain/pressure, [ ]  Shortness of breath at rest [ ]  Shortness of breath with exertion, [ ]  Atrial fibrillation or irregular heartbeat Vascular: [ ]  Pain in legs with walking, [ ]  Pain in legs at rest, [ ]  Pain in legs at night,  [ ]  Non-healing ulcer, [ ]  Blood clot in vein/DVT,   Pulmonary: [ ]  Home oxygen, [ ]  Productive cough, [ ]  Coughing up blood, [ ]  Asthma,  [ ]  Wheezing Musculoskeletal:  [ ]  Arthritis, [ ]  Low back pain, [ ]  Joint pain Hematologic: [ ]  Easy Bruising, [ ]  Anemia; [ ]  Hepatitis Gastrointestinal: [ ]  Blood in stool, [ ]  Gastroesophageal Reflux/heartburn, [ ]  Trouble swallowing Urinary: [ ]  chronic  Kidney disease, [ ]  on HD - [ ]  MWF or [ ]  TTHS, [ ]  Burning with urination, [ ]  Difficulty urinating Skin: [ ]  Rashes, [ ]  Wounds Psychological: [ ]  Anxiety, [ ]  Depression   Social History History  Substance Use Topics  . Smoking status: Former Smoker    Quit date: 12/11/2002  . Smokeless tobacco: Never Used  . Alcohol Use: No    Family History Family History  Problem Relation Age of Onset  . Colon cancer Sister   . Diabetes Sister   . Heart disease Sister   . Heart disease Brother   . Lung cancer Mother   . Cancer Mother   . Heart attack Maternal Grandmother   . Colon polyps Sister     Allergies  Allergen Reactions  . Ace Inhibitors     REACTION: cough  . Penicillins Other (See Comments)    "childhood allergy"  . Tussionex Pennkinetic Er (Hydrocod Polst-Cpm Polst Er) Other (See Comments)    "caused prostate problems"    Current Outpatient Prescriptions  Medication Sig Dispense Refill  . amiodarone (CORDARONE) 200 MG tablet Take 200 mg by mouth daily.       Marland Kitchen aspirin 81 MG tablet Take 81 mg by mouth daily.      Marland Kitchen atorvastatin (LIPITOR) 40 MG tablet Take 40  mg by mouth daily.        Marland Kitchen CALCIUM PO Take 1 tablet by mouth daily.      . carvedilol (COREG) 25 MG tablet Take 25 mg by mouth 2 (two) times daily with a meal.        . cholecalciferol (VITAMIN D) 1000 UNITS tablet Take 1,000 Units by mouth daily.      . clopidogrel (PLAVIX) 75 MG tablet Take 75 mg by mouth daily.        Golden Hurter 18-103 MCG/ACT inhaler       . finasteride (PROSCAR) 5 MG tablet Take 5 mg by mouth daily.      . fish oil-omega-3 fatty acids 1000 MG capsule Take 2 g by mouth daily.        . fluticasone (FLONASE) 50 MCG/ACT nasal spray Place 2 sprays into the nose daily. At bedtime  16 g  prn  . furosemide (LASIX) 80 MG tablet TAKE 1 TABLET (80 MG TOTAL) BY MOUTH 2 (TWO) TIMES DAILY.  60 tablet  2  . isosorbide mononitrate (IMDUR) 60 MG 24 hr tablet Take 1 tablet every am and 0.5 tablet at  bedtime      . levothyroxine (SYNTHROID, LEVOTHROID) 50 MCG tablet Take 1 tablet (50 mcg total) by mouth daily.  90 tablet  1  . metolazone (ZAROXOLYN) 2.5 MG tablet continuous as needed.      . Multiple Vitamins-Minerals (MENS 50+ MULTI VITAMIN/MIN) TABS Take by mouth daily.      . multivitamin-lutein (OCUVITE-LUTEIN) CAPS Take 1 capsule by mouth daily.        . nitroGLYCERIN (NITROSTAT) 0.4 MG SL tablet Place 0.4 mg under the tongue every 5 (five) minutes as needed.        . ramipril (ALTACE) 2.5 MG tablet Take 2 capsule every morning Take 1 capsule every evening      . ranitidine (ZANTAC) 300 MG tablet Take 1 tablet (300 mg total) by mouth at bedtime.  90 tablet  3  . spironolactone (ALDACTONE) 25 MG tablet Take 25 mg by mouth daily.        . Tamsulosin HCl (FLOMAX) 0.4 MG CAPS Take 0.4 mg by mouth daily.      Marland Kitchen tiotropium (SPIRIVA HANDIHALER) 18 MCG inhalation capsule Place 1 capsule (18 mcg total) into inhaler and inhale daily.  30 capsule  12    Physical Examination  Filed Vitals:   10/31/12 0927  BP: 102/57  Pulse: 61  Resp: 18    Body mass index is 43.74 kg/(m^2).  General:  WDWN in NAD Gait: Normal HEENT: WNL Eyes: Pupils equal Pulmonary: normal non-labored breathing , without Rales, rhonchi,  wheezing Cardiac: RRR, without  Murmurs, rubs or gallops; No carotid bruits Abdomen: soft, NT, no masses Skin: no rashes, ulcers noted Vascular Exam/Pulses: Palpable femoral pulses bilaterally, no carotid bruits are heard, there is no abdominal mass palpated due to girth  Extremities without ischemic changes, no Gangrene , no cellulitis; no open wounds;  Musculoskeletal: no muscle wasting or atrophy  Neurologic: A&O X 3; Appropriate Affect ; SENSATION: normal; MOTOR FUNCTION:  moving all extremities equally. Speech is fluent/normal  Non-Invasive Vascular Imaging: Poor visualization today on the duplex which shows a maximum diameter of 4.9 x 4.8. The patient was 4.94 in May  2013.  ASSESSMENT/PLAN: Asymptomatic AAA that is stable from previous exam. The patient will followup in 6 months with repeat duplex and see Dr. Bridgett Larsson. The patient's questions were encouraged and answered, he  is in agreement with this plan.  Beatris Ship ANP  M.D.: Bridgett Larsson

## 2012-10-31 NOTE — Addendum Note (Signed)
Addended by: Mena Goes on: 10/31/2012 03:52 PM   Modules accepted: Orders

## 2012-11-06 DIAGNOSIS — N478 Other disorders of prepuce: Secondary | ICD-10-CM | POA: Diagnosis not present

## 2012-11-06 DIAGNOSIS — N471 Phimosis: Secondary | ICD-10-CM | POA: Diagnosis not present

## 2012-11-06 DIAGNOSIS — N401 Enlarged prostate with lower urinary tract symptoms: Secondary | ICD-10-CM | POA: Diagnosis not present

## 2012-11-19 ENCOUNTER — Other Ambulatory Visit (HOSPITAL_COMMUNITY): Payer: Self-pay | Admitting: Cardiovascular Disease

## 2012-11-19 ENCOUNTER — Ambulatory Visit: Payer: Medicare Other | Admitting: Internal Medicine

## 2012-11-19 ENCOUNTER — Ambulatory Visit (HOSPITAL_COMMUNITY)
Admission: RE | Admit: 2012-11-19 | Discharge: 2012-11-19 | Disposition: A | Discharge status: Home / Self Care | Payer: Medicare Other | Source: Ambulatory Visit | Attending: Cardiovascular Disease | Admitting: Cardiovascular Disease

## 2012-11-19 DIAGNOSIS — I701 Atherosclerosis of renal artery: Secondary | ICD-10-CM

## 2012-11-19 DIAGNOSIS — I6529 Occlusion and stenosis of unspecified carotid artery: Secondary | ICD-10-CM

## 2012-11-19 DIAGNOSIS — I70219 Atherosclerosis of native arteries of extremities with intermittent claudication, unspecified extremity: Secondary | ICD-10-CM | POA: Diagnosis not present

## 2012-11-19 DIAGNOSIS — I359 Nonrheumatic aortic valve disorder, unspecified: Secondary | ICD-10-CM | POA: Diagnosis not present

## 2012-11-19 DIAGNOSIS — I771 Stricture of artery: Secondary | ICD-10-CM

## 2012-11-19 DIAGNOSIS — I10 Essential (primary) hypertension: Secondary | ICD-10-CM | POA: Diagnosis not present

## 2012-11-19 NOTE — Progress Notes (Signed)
BLE arterial duplex completed. Alla German

## 2012-11-19 NOTE — Progress Notes (Signed)
Carotid duplex completed. [name]

## 2012-11-19 NOTE — Progress Notes (Signed)
Renal duplex completed. [name] D  

## 2012-12-11 HISTORY — PX: TRANSTHORACIC ECHOCARDIOGRAM: SHX275

## 2012-12-12 ENCOUNTER — Ambulatory Visit: Payer: Medicare Other | Admitting: Internal Medicine

## 2012-12-19 DIAGNOSIS — I10 Essential (primary) hypertension: Secondary | ICD-10-CM | POA: Diagnosis not present

## 2012-12-19 DIAGNOSIS — I2510 Atherosclerotic heart disease of native coronary artery without angina pectoris: Secondary | ICD-10-CM | POA: Diagnosis not present

## 2012-12-19 DIAGNOSIS — I447 Left bundle-branch block, unspecified: Secondary | ICD-10-CM | POA: Diagnosis not present

## 2012-12-20 ENCOUNTER — Ambulatory Visit (INDEPENDENT_AMBULATORY_CARE_PROVIDER_SITE_OTHER): Payer: Medicare Other | Admitting: Internal Medicine

## 2012-12-20 ENCOUNTER — Encounter: Payer: Self-pay | Admitting: Internal Medicine

## 2012-12-20 VITALS — BP 122/66 | HR 69 | Ht 68.0 in | Wt 293.0 lb

## 2012-12-20 DIAGNOSIS — R6889 Other general symptoms and signs: Secondary | ICD-10-CM | POA: Diagnosis not present

## 2012-12-20 DIAGNOSIS — R0989 Other specified symptoms and signs involving the circulatory and respiratory systems: Secondary | ICD-10-CM

## 2012-12-20 DIAGNOSIS — R0600 Dyspnea, unspecified: Secondary | ICD-10-CM

## 2012-12-20 DIAGNOSIS — R5383 Other fatigue: Secondary | ICD-10-CM | POA: Diagnosis not present

## 2012-12-20 DIAGNOSIS — R0602 Shortness of breath: Secondary | ICD-10-CM | POA: Diagnosis not present

## 2012-12-20 DIAGNOSIS — R0609 Other forms of dyspnea: Secondary | ICD-10-CM | POA: Diagnosis not present

## 2012-12-20 DIAGNOSIS — G4733 Obstructive sleep apnea (adult) (pediatric): Secondary | ICD-10-CM

## 2012-12-20 DIAGNOSIS — R5381 Other malaise: Secondary | ICD-10-CM | POA: Diagnosis not present

## 2012-12-20 DIAGNOSIS — I509 Heart failure, unspecified: Secondary | ICD-10-CM | POA: Diagnosis not present

## 2012-12-20 DIAGNOSIS — J449 Chronic obstructive pulmonary disease, unspecified: Secondary | ICD-10-CM

## 2012-12-20 DIAGNOSIS — Z9989 Dependence on other enabling machines and devices: Secondary | ICD-10-CM

## 2012-12-20 MED ORDER — TIOTROPIUM BROMIDE MONOHYDRATE 18 MCG IN CAPS: 18.0000 ug | ORAL_CAPSULE | Freq: Every day | RESPIRATORY_TRACT | Status: DC

## 2012-12-20 NOTE — Patient Instructions (Addendum)
Order- ONOX on room air with his CPAP   Dx COPD, CHF  Walk as much as you can, to lose weight and squeeze the water out.   Sample and refill script for Spiriva 1 daily

## 2012-12-20 NOTE — Progress Notes (Signed)
06/03/12- 43 yoM former smoker seen in pulmonary consultation at the request of Dr. Ronnald Ramp. Per Dr. Ronnald Ramp. Patient c/o sob and difficultly breathing when sitting while talking and walking.  Denies chest pain, chest tightness, wheeizng, and cough. Diagnosed with COPD 10 years ago. Wife is here. COPD Assessment Test (CAT) scored 24/40. He had smoked a pack a day half per day until 2005. 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 He was then diagnosed with hypothyroidism. Since then he has not exercised regularly, has felt weak with easier dyspnea on exertion. He denies cough or wheeze. Little change from day to day. His wife says he gets short of breath climbing 10 steps and he has to stop at the top. He has gained weight since thyroid diagnosis and with lack of exercise. He indicates 40 pound weight gain in the past 2 years. Coronary artery disease with history of multiple myocardial infarctions, congestive heart failure, hypertension, implanted AICD in left chest. No history of asthma. Ankles swell. Diagnosed obstructive sleep apnea/Dr. Kelly/CPAP/Advanced, unknown pressure. Anemia 4 years ago required transfusion. Uses Spiriva daily, occasional rescue Combivent./ MI/ AICD, OSA(Dr Kelly)  07/16/12- 66 yoM former smoker followed for dyspnea/ COPD, complicated by hx OSA/ Dr Claiborne Billings, CAD/ CHF/ MI/ AICD Wife is here today Found to be anemic and now treated with iron. Very short of breath over the weekend/blames weather, limiting dyspnea on exertion then but today feels "great". Using Spiriva with rare need for rescue albuterol. Says he is getting bronchitis less often than in the past. COPD assessment test (CAT) score 27/40 Complains of nasal stuffiness supine. Failed nasal strips. Sleeps sitting up because of this and uses saline nasal rinse. 6 minute walk test 07/16/2012: 95%, 97%, 98%, 216 m-ended test labored  for breath and fatigue but without desaturation. CXR 06/10/12 reviewed with them-bronchitis changes, CE/ASVD/CABG IMPRESSION:  1. Diffuse peribronchial cuffing. Given the patient's history of  prior smoking, this may suggest chronic bronchitis.  2. In addition, there is mild cardiomegaly with pulmonary venous  congestion, but no frank pulmonary edema at this time.  3. Status post median sternotomy for CABG.  4. Atherosclerosis.  Original Report Authenticated By: Etheleen Mayhew, M.D.  PFT: 07/16/2012-moderate to severe obstructive airways disease with air trapping, diffusion mildly reduced. FEV1/FVC 0.49. Insignificant response to bronchodilator. RV 128%, DLCO 69%.   12/20/12-  44 yoM former smoker followed for dyspnea/ COPD, complicated by hx OSA/ Dr Claiborne Billings, CAD/ CHF/ MI/ AICD     Wife here FOLLOWS FOR: breathing is unchanged since last OV. denies chest pain, chest tightness or coughing. reports increased SOB with extertion. Probably no change in dyspnea with exertion when he thinks in terms of the impact on ADLs. He says he joined weight watchers last night and plans to lose 100 pounds. His cardiologist has increased his diuretic. No routine cough, phlegm or wheeze. CXR 06/10/12 IMPRESSION:  1. Diffuse peribronchial cuffing. Given the patient's history of  prior smoking, this may suggest chronic bronchitis.  2. In addition, there is mild cardiomegaly with pulmonary venous  congestion, but no frank pulmonary edema at this time.  3. Status post median sternotomy for CABG.  4. Atherosclerosis.  Original Report Authenticated By: Etheleen Mayhew, M.D.  ROS-see HPI Constitutional:   No-   weight loss, night sweats, fevers, chills, fatigue, lassitude. HEENT:   No-  headaches, difficulty swallowing, tooth/dental problems, sore throat,  No-  sneezing, itching, ear ache, +nasal congestion, post nasal drip,  CV:  No-   chest pain, +orthopnea, PND, +swelling in lower extremities,  No-anasarca, dizziness, palpitations Resp: + shortness of breath with exertion or at rest.              No-   productive cough,  No non-productive cough,  No- coughing up of blood.              No-   change in color of mucus.  No- wheezing.   Skin: No-   rash or lesions. GI:  No-   heartburn, indigestion, abdominal pain, nausea, vomiting, GU: . MS:  No-   joint pain or swelling.  Neuro-     nothing unusual Psych:  No- change in mood or affect. No depression or anxiety.  No memory loss.  OBJ- Physical Exam BP 122/66  Pulse 69  Ht 5\' 8"  (1.727 m)  Wt 293 lb (132.904 kg)  BMI 44.55 kg/m2  SpO2 94% General- Alert, Oriented, Affect-appropriate, Distress- none acute, obese, talkative Skin- rash-none, lesions- none, excoriation- none Lymphadenopathy- none Head- atraumatic            Eyes- Gross vision intact, PERRLA, conjunctivae and secretions clear            Ears- Hearing, canals-normal            Nose- +turbinate edema, no-Septal dev, mucus, polyps, erosion, perforation             Throat- Mallampati III, mucosa clear , drainage- none, tonsils- atrophic Neck- flexible , trachea midline, no stridor , thyroid nl, carotid no bruit Chest - symmetrical excursion , unlabored           Heart/CV- RRR , no murmur , no gallop  , no rub, nl s1 s2                           - JVD- none , edema 2+, stasis changes- none, varices- none           Lung- clear to P&A shallow inspiratory effort, rales- none, wheeze- none, cough- none , dullness-none, rub- none, raspy last           Chest wall- sternotomy scar Abd-  Br/ Gen/ Rectal- Not done, not indicated Extrem- cyanosis- none, clubbing, none, atrophy- none, strength- nl, vein donor scars Neuro- head bob tremor

## 2012-12-25 ENCOUNTER — Telehealth: Payer: Self-pay | Admitting: Oncology

## 2012-12-25 ENCOUNTER — Other Ambulatory Visit (HOSPITAL_COMMUNITY): Payer: Self-pay | Admitting: Cardiovascular Disease

## 2012-12-25 DIAGNOSIS — I509 Heart failure, unspecified: Secondary | ICD-10-CM

## 2012-12-25 NOTE — Telephone Encounter (Signed)
pt's wife called and needed to change appt.Marland KitchenMarland KitchenMarland KitchenDone

## 2012-12-30 NOTE — Assessment & Plan Note (Signed)
He reports good compliance and control on CPAP

## 2012-12-30 NOTE — Assessment & Plan Note (Addendum)
Controlled, probably with little change since last here. Multifactorial dyspnea makes lung disease harder to assess. Plan-overnight oximetry on room air with CPAP

## 2012-12-30 NOTE — Assessment & Plan Note (Signed)
Multifactorial-COPD, CHF, obesity and deconditioning

## 2013-01-01 ENCOUNTER — Telehealth: Payer: Self-pay | Admitting: Internal Medicine

## 2013-01-01 NOTE — Telephone Encounter (Signed)
Order was faxed to [facility] on 12/20/12, however, Minna Merritts states that the [employer] did not receive the order. I re-faxed the ONO order to the [employer] [facility] at [phone], attn: Lissa Merlin. Rhonda J Cobb

## 2013-01-01 NOTE — Telephone Encounter (Signed)
Order was placed 12/20/12. Please advise PCC's thanks

## 2013-01-02 ENCOUNTER — Ambulatory Visit: Payer: Medicare Other | Admitting: Oncology

## 2013-01-02 ENCOUNTER — Other Ambulatory Visit: Payer: Medicare Other | Admitting: Lab

## 2013-01-02 ENCOUNTER — Ambulatory Visit (HOSPITAL_COMMUNITY)
Admission: RE | Admit: 2013-01-02 | Discharge: 2013-01-02 | Disposition: A | Discharge status: Home / Self Care | Payer: Medicare Other | Source: Ambulatory Visit | Attending: Cardiovascular Disease | Admitting: Cardiovascular Disease

## 2013-01-02 DIAGNOSIS — I2510 Atherosclerotic heart disease of native coronary artery without angina pectoris: Secondary | ICD-10-CM | POA: Insufficient documentation

## 2013-01-02 DIAGNOSIS — E119 Type 2 diabetes mellitus without complications: Secondary | ICD-10-CM | POA: Insufficient documentation

## 2013-01-02 DIAGNOSIS — J449 Chronic obstructive pulmonary disease, unspecified: Secondary | ICD-10-CM | POA: Diagnosis not present

## 2013-01-02 DIAGNOSIS — I369 Nonrheumatic tricuspid valve disorder, unspecified: Secondary | ICD-10-CM | POA: Insufficient documentation

## 2013-01-02 DIAGNOSIS — I10 Essential (primary) hypertension: Secondary | ICD-10-CM | POA: Insufficient documentation

## 2013-01-02 DIAGNOSIS — I059 Rheumatic mitral valve disease, unspecified: Secondary | ICD-10-CM | POA: Diagnosis not present

## 2013-01-02 DIAGNOSIS — Z95810 Presence of automatic (implantable) cardiac defibrillator: Secondary | ICD-10-CM | POA: Diagnosis not present

## 2013-01-02 DIAGNOSIS — I252 Old myocardial infarction: Secondary | ICD-10-CM | POA: Diagnosis not present

## 2013-01-02 DIAGNOSIS — J4489 Other specified chronic obstructive pulmonary disease: Secondary | ICD-10-CM | POA: Insufficient documentation

## 2013-01-02 DIAGNOSIS — I379 Nonrheumatic pulmonary valve disorder, unspecified: Secondary | ICD-10-CM | POA: Diagnosis not present

## 2013-01-02 DIAGNOSIS — I509 Heart failure, unspecified: Secondary | ICD-10-CM | POA: Diagnosis not present

## 2013-01-02 NOTE — Progress Notes (Signed)
[facility]   2D echo completed 01/02/2013.   Jamison Neighbor, RDCS

## 2013-01-09 ENCOUNTER — Other Ambulatory Visit: Payer: Self-pay | Admitting: Internal Medicine

## 2013-01-11 ENCOUNTER — Other Ambulatory Visit: Payer: Self-pay | Admitting: Internal Medicine

## 2013-01-13 ENCOUNTER — Telehealth: Payer: Self-pay | Admitting: Internal Medicine

## 2013-01-13 NOTE — Telephone Encounter (Signed)
Spoke with Mrs. Reed at [facility] in [place]. She stated that [facility] received the order for ONO on 01/02/13. Mrs Mariea Clonts stated that Minna Merritts stated that she faxed an "IDS" form for [place] to complete and Minna Merritts hasn't received this form back yet. I advised Mrs. Reed that 1) I haven't seen an IDS form and why do we have to complete a form when everything is on our order. Mrs. Mariea Clonts stated that they did not have all the information they needed to process order. I asked her why no one from [facility] ever called back. She stated that she couldn't answer that. Stated that she was faxing the form now. Once received I will fax it back and call to make sure this has been received. Rhonda J Cobb

## 2013-01-13 NOTE — Telephone Encounter (Signed)
Received IDS form. Form completed and faxed back to IDS [phone]). Serena with [facility] stated that once form was faxed to [facility], [facility] would contact [facility] that they have an ONO to schedule. I have called and advised Roanna Epley that form was faxed, confirmation received and to please contact patient to schedule. Rhonda J Cobb

## 2013-01-13 NOTE — Telephone Encounter (Signed)
[name] I am sending this to you so you are aware pt still has not heard anything about ONO, Thanks TD

## 2013-01-14 DIAGNOSIS — J449 Chronic obstructive pulmonary disease, unspecified: Secondary | ICD-10-CM | POA: Diagnosis not present

## 2013-01-14 DIAGNOSIS — I442 Atrioventricular block, complete: Secondary | ICD-10-CM | POA: Diagnosis not present

## 2013-01-21 ENCOUNTER — Telehealth: Payer: Self-pay | Admitting: Oncology

## 2013-01-21 NOTE — Telephone Encounter (Signed)
per Dr Alen Blew move pt to nxt available...lvm for pt regarding appt for 2.21.14.Marland Kitchen

## 2013-01-22 ENCOUNTER — Other Ambulatory Visit: Payer: Medicare Other | Admitting: Lab

## 2013-01-22 ENCOUNTER — Ambulatory Visit: Payer: Medicare Other | Admitting: Oncology

## 2013-01-31 ENCOUNTER — Encounter: Payer: Self-pay | Admitting: Internal Medicine

## 2013-01-31 ENCOUNTER — Ambulatory Visit: Payer: Medicare Other | Admitting: Oncology

## 2013-01-31 ENCOUNTER — Other Ambulatory Visit: Payer: Medicare Other

## 2013-02-05 ENCOUNTER — Telehealth: Payer: Self-pay | Admitting: Oncology

## 2013-02-05 NOTE — Telephone Encounter (Signed)
returned pt call and lvm regarding to April appt.Marland KitchenMarland Kitchen

## 2013-02-24 DIAGNOSIS — I509 Heart failure, unspecified: Secondary | ICD-10-CM | POA: Diagnosis not present

## 2013-02-24 DIAGNOSIS — I4891 Unspecified atrial fibrillation: Secondary | ICD-10-CM | POA: Diagnosis not present

## 2013-03-13 ENCOUNTER — Ambulatory Visit (INDEPENDENT_AMBULATORY_CARE_PROVIDER_SITE_OTHER)
Admission: RE | Admit: 2013-03-13 | Discharge: 2013-03-13 | Disposition: A | Discharge status: Home / Self Care | Payer: Medicare Other | Source: Ambulatory Visit | Attending: Internal Medicine | Admitting: Internal Medicine

## 2013-03-13 ENCOUNTER — Other Ambulatory Visit (INDEPENDENT_AMBULATORY_CARE_PROVIDER_SITE_OTHER): Payer: Medicare Other

## 2013-03-13 ENCOUNTER — Encounter: Payer: Self-pay | Admitting: Internal Medicine

## 2013-03-13 ENCOUNTER — Ambulatory Visit (INDEPENDENT_AMBULATORY_CARE_PROVIDER_SITE_OTHER): Payer: Medicare Other | Admitting: Internal Medicine

## 2013-03-13 VITALS — BP 110/70 | HR 60 | Temp 98.2°F | Resp 16 | Wt 262.2 lb

## 2013-03-13 DIAGNOSIS — R059 Cough, unspecified: Secondary | ICD-10-CM

## 2013-03-13 DIAGNOSIS — E039 Hypothyroidism, unspecified: Secondary | ICD-10-CM

## 2013-03-13 DIAGNOSIS — R7309 Other abnormal glucose: Secondary | ICD-10-CM

## 2013-03-13 DIAGNOSIS — J4489 Other specified chronic obstructive pulmonary disease: Secondary | ICD-10-CM

## 2013-03-13 DIAGNOSIS — I10 Essential (primary) hypertension: Secondary | ICD-10-CM | POA: Diagnosis not present

## 2013-03-13 DIAGNOSIS — D509 Iron deficiency anemia, unspecified: Secondary | ICD-10-CM

## 2013-03-13 DIAGNOSIS — J449 Chronic obstructive pulmonary disease, unspecified: Secondary | ICD-10-CM | POA: Diagnosis not present

## 2013-03-13 DIAGNOSIS — R05 Cough: Secondary | ICD-10-CM | POA: Diagnosis not present

## 2013-03-13 DIAGNOSIS — J209 Acute bronchitis, unspecified: Secondary | ICD-10-CM

## 2013-03-13 LAB — COMPREHENSIVE METABOLIC PANEL
ALT: 16 U/L (ref 0–53)
AST: 19 U/L (ref 0–37)
Albumin: 3.9 g/dL (ref 3.5–5.2)
Alkaline Phosphatase: 78 U/L (ref 39–117)
BUN: 18 mg/dL (ref 6–23)
CO2: 27 mEq/L (ref 19–32)
Calcium: 8.7 mg/dL (ref 8.4–10.5)
Chloride: 104 mEq/L (ref 96–112)
Creatinine, Ser: 0.9 mg/dL (ref 0.4–1.5)
GFR: 90.3 mL/min (ref >60.00)
Glucose, Bld: 109 mg/dL — ABNORMAL HIGH (ref 70–99)
Potassium: 4.7 mEq/L (ref 3.5–5.1)
Sodium: 135 mEq/L (ref 135–145)
Total Bilirubin: 0.7 mg/dL (ref 0.3–1.2)
Total Protein: 6.8 g/dL (ref 6.0–8.3)

## 2013-03-13 LAB — CBC WITH DIFFERENTIAL/PLATELET
Basophils Absolute: 0 10*3/uL (ref 0.0–0.1)
Basophils Relative: 0.2 % (ref 0.0–3.0)
Eosinophils Absolute: 0.2 10*3/uL (ref 0.0–0.7)
Eosinophils Relative: 2.1 % (ref 0.0–5.0)
HCT: 40.2 % (ref 39.0–52.0)
Hemoglobin: 13.2 g/dL (ref 13.0–17.0)
Lymphocytes Relative: 8.2 % — ABNORMAL LOW (ref 12.0–46.0)
Lymphs Abs: 0.8 10*3/uL (ref 0.7–4.0)
MCHC: 32.9 g/dL (ref 30.0–36.0)
MCV: 86.7 fl (ref 78.0–100.0)
Monocytes Absolute: 0.7 10*3/uL (ref 0.1–1.0)
Monocytes Relative: 8.1 % (ref 3.0–12.0)
Neutro Abs: 7.5 10*3/uL (ref 1.4–7.7)
Neutrophils Relative %: 81.4 % — ABNORMAL HIGH (ref 43.0–77.0)
Platelets: 126 10*3/uL — ABNORMAL LOW (ref 150.0–400.0)
RBC: 4.64 Mil/uL (ref 4.22–5.81)
RDW: 16.3 % — ABNORMAL HIGH (ref 11.5–14.6)
WBC: 9.2 10*3/uL (ref 4.5–10.5)

## 2013-03-13 LAB — HEMOGLOBIN A1C: Hgb A1c MFr Bld: 6 % (ref 4.6–6.5)

## 2013-03-13 MED ORDER — PROMETHAZINE-DM 6.25-15 MG/5ML PO SYRP: 5.0000 mL | ORAL_SOLUTION | Freq: Four times a day (QID) | ORAL | Status: DC | PRN

## 2013-03-13 MED ORDER — AZITHROMYCIN 500 MG PO TABS: 500.0000 mg | ORAL_TABLET | Freq: Every day | ORAL | Status: DC

## 2013-03-13 NOTE — Patient Instructions (Signed)

## 2013-03-13 NOTE — Progress Notes (Signed)
Subjective:    Patient ID: [name], male    DOB: 1946-06-27, 67 y.o.   MRN: [id number]  Cough This is a new problem. The current episode started yesterday. The problem has been unchanged. The problem occurs every few hours. The cough is productive of purulent sputum. Associated symptoms include chills and a sore throat. Pertinent negatives include no chest pain, ear congestion, ear pain, fever, headaches, heartburn, hemoptysis, myalgias, nasal congestion, postnasal drip, rash, rhinorrhea, shortness of breath, sweats, weight loss or wheezing. Nothing aggravates the symptoms. He has tried ipratropium inhaler for the symptoms. The treatment provided mild relief. His past medical history is significant for COPD. There is no history of asthma, bronchiectasis, bronchitis, emphysema, environmental allergies or pneumonia.      Review of Systems  Constitutional: Positive for chills. Negative for fever, weight loss, diaphoresis, activity change, appetite change, fatigue and unexpected weight change.  HENT: Positive for sore throat. Negative for ear pain, facial swelling, rhinorrhea, mouth sores, trouble swallowing, voice change and postnasal drip.   Eyes: Negative.   Respiratory: Positive for cough. Negative for hemoptysis, shortness of breath and wheezing.   Cardiovascular: Negative.  Negative for chest pain, palpitations and leg swelling.  Gastrointestinal: Negative.  Negative for heartburn, nausea, vomiting, abdominal pain, diarrhea, constipation and blood in stool.  Endocrine: Negative.   Genitourinary: Negative.   Musculoskeletal: Negative.  Negative for myalgias and arthralgias.  Skin: Negative.  Negative for rash.  Allergic/Immunologic: Negative.  Negative for environmental allergies.  Neurological: Negative.  Negative for dizziness, tremors, weakness, light-headedness and headaches.  Hematological: Negative for adenopathy. Does not bruise/bleed easily.  Psychiatric/Behavioral: Negative.         Objective:   Physical Exam  Vitals reviewed. Constitutional: He is oriented to person, place, and time. He appears well-developed and well-nourished.  Non-toxic appearance. He does not have a sickly appearance. He does not appear ill. No distress.  HENT:  Head: No trismus in the jaw.  Right Ear: Hearing, tympanic membrane, external ear and ear canal normal.  Left Ear: Hearing, tympanic membrane, external ear and ear canal normal.  Nose: Nose normal.  Mouth/Throat: Uvula is midline and mucous membranes are normal. Mucous membranes are not pale, not dry and not cyanotic. No oral lesions. Posterior oropharyngeal erythema present. No oropharyngeal exudate, posterior oropharyngeal edema or tonsillar abscesses.  Eyes: Conjunctivae are normal. Right eye exhibits no discharge. Left eye exhibits no discharge. No scleral icterus.  Neck: Normal range of motion. Neck supple. No JVD present. No tracheal deviation present. No thyromegaly present.  Cardiovascular: Normal rate, regular rhythm, normal heart sounds and intact distal pulses.  Exam reveals no gallop and no friction rub.   No murmur heard. Pulmonary/Chest: Effort normal and breath sounds normal. No accessory muscle usage or stridor. Not tachypneic. No respiratory distress. He has no decreased breath sounds. He has no wheezes. He has no rhonchi. He has no rales. He exhibits no tenderness.  Abdominal: Soft. Bowel sounds are normal. He exhibits no distension and no mass. There is no tenderness. There is no rebound and no guarding.  Musculoskeletal: Normal range of motion. He exhibits no edema and no tenderness.  Lymphadenopathy:    He has no cervical adenopathy.  Neurological: He is oriented to person, place, and time.  Skin: Skin is warm and dry. No rash noted. He is not diaphoretic. No erythema. No pallor.  Psychiatric: He has a normal mood and affect. His behavior is normal. Judgment and thought content normal.  Lab Results   Component Value Date   WBC 6.8 10/02/2012   HGB 14.0 10/02/2012   HCT 41.0 10/02/2012   PLT 161 10/02/2012   GLUCOSE 190* 10/02/2012   CHOL 118 04/18/2012   TRIG 51.0 04/18/2012   HDL 51.20 04/18/2012   LDLCALC 57 04/18/2012   ALT 23 10/02/2012   AST 21 10/02/2012   NA 136 10/02/2012   K 3.7 10/02/2012   CL 94* 10/02/2012   CREATININE 1.3 10/02/2012   BUN 32.0* 10/02/2012   CO2 29 10/02/2012   TSH 5.77* 04/18/2012   PSA 0.51 04/18/2012   INR 1.02 01/03/2012   HGBA1C 6.4 04/18/2012      Assessment & Plan:

## 2013-03-14 ENCOUNTER — Encounter: Payer: Self-pay | Admitting: Internal Medicine

## 2013-03-14 LAB — TSH: TSH: 3.3 u[IU]/mL (ref 0.35–5.50)

## 2013-03-14 NOTE — Assessment & Plan Note (Signed)
His BP is well controlled Today I will check his lytes and renal function 

## 2013-03-14 NOTE — Assessment & Plan Note (Signed)
Start zpak for the infection and a cough suppressant 

## 2013-03-14 NOTE — Assessment & Plan Note (Signed)
I will recheck his TSH today and will adjust the dose if needed

## 2013-03-14 NOTE — Assessment & Plan Note (Signed)
I will recheck his CBC today 

## 2013-03-14 NOTE — Assessment & Plan Note (Signed)
No evidence of exacerbation today

## 2013-03-14 NOTE — Assessment & Plan Note (Signed)
I will check his CXR to see if he has pna, mass, edema

## 2013-03-14 NOTE — Assessment & Plan Note (Signed)
I will check his A1C today to see if he has developed DM2

## 2013-03-19 ENCOUNTER — Other Ambulatory Visit (HOSPITAL_BASED_OUTPATIENT_CLINIC_OR_DEPARTMENT_OTHER): Payer: Medicare Other | Admitting: Lab

## 2013-03-19 ENCOUNTER — Telehealth: Payer: Self-pay | Admitting: Oncology

## 2013-03-19 ENCOUNTER — Ambulatory Visit (HOSPITAL_BASED_OUTPATIENT_CLINIC_OR_DEPARTMENT_OTHER): Payer: Medicare Other | Admitting: Oncology

## 2013-03-19 VITALS — BP 116/58 | HR 62 | Temp 97.3°F | Resp 62 | Ht 68.0 in | Wt 267.1 lb

## 2013-03-19 DIAGNOSIS — D649 Anemia, unspecified: Secondary | ICD-10-CM

## 2013-03-19 LAB — CBC WITH DIFFERENTIAL/PLATELET
BASO%: 0.3 % (ref 0.0–2.0)
Basophils Absolute: 0 10*3/uL (ref 0.0–0.1)
EOS%: 5.5 % (ref 0.0–7.0)
Eosinophils Absolute: 0.3 10*3/uL (ref 0.0–0.5)
HCT: 41.2 % (ref 38.4–49.9)
HGB: 12.9 g/dL — ABNORMAL LOW (ref 13.0–17.1)
LYMPH%: 19 % (ref 14.0–49.0)
MCH: 28.3 pg (ref 27.2–33.4)
MCHC: 31.3 g/dL — ABNORMAL LOW (ref 32.0–36.0)
MCV: 90.4 fL (ref 79.3–98.0)
MONO#: 0.6 10*3/uL (ref 0.1–0.9)
MONO%: 9.9 % (ref 0.0–14.0)
NEUT#: 4 10*3/uL (ref 1.5–6.5)
NEUT%: 65.3 % (ref 39.0–75.0)
Platelets: 126 10*3/uL — ABNORMAL LOW (ref 140–400)
RBC: 4.56 10*6/uL (ref 4.20–5.82)
RDW: 15.9 % — ABNORMAL HIGH (ref 11.0–14.6)
WBC: 6.2 10*3/uL (ref 4.0–10.3)
lymph#: 1.2 10*3/uL (ref 0.9–3.3)

## 2013-03-19 LAB — IRON AND TIBC
%SAT: 8 % — ABNORMAL LOW (ref 20–55)
Iron: 28 ug/dL — ABNORMAL LOW (ref 42–165)
TIBC: 358 ug/dL (ref 215–435)
UIBC: 330 ug/dL (ref 125–400)

## 2013-03-19 LAB — FERRITIN: Ferritin: 13 ng/mL — ABNORMAL LOW (ref 22–322)

## 2013-03-19 NOTE — Progress Notes (Signed)
Hematology and Oncology Follow Up Visit  [name] [id number] 09/18/1946 67 y.o. 03/19/2013 3:29 PM   Principle Diagnosis: 67 year old with iron deficiency anemia diagnosed in 06/2012. This is likely due to GI  loss from AVMs  Prior Therapy: S/P Feraheme in 06/2012 with normalizations of his Fe since  Interim History:  [name] presents today for a follow up visit. He tolerated IV iron without complications and felt a lot better since.  Clinically he had not reported any progressive symptoms of weakness, fatigue, tiredness, dyspnea on exertion, weight gain. He had not reported any active bleeding. He does report some occasional hematochezia but no overt bleeding at this time. His energy level and activity is back to base line.    Medications: I have reviewed the patient's current medications. Current outpatient prescriptions:amiodarone (CORDARONE) 200 MG tablet, Take 200 mg by mouth daily. , Disp: , Rfl: ;  aspirin 81 MG tablet, Take 81 mg by mouth daily., Disp: , Rfl: ;  atorvastatin (LIPITOR) 40 MG tablet, Take 40 mg by mouth daily.  , Disp: , Rfl: ;  azithromycin (ZITHROMAX) 500 MG tablet, Take 1 tablet (500 mg total) by mouth daily., Disp: 3 tablet, Rfl: 0 carvedilol (COREG) 25 MG tablet, Take 25 mg by mouth 2 (two) times daily with a meal.  , Disp: , Rfl: ;  clopidogrel (PLAVIX) 75 MG tablet, Take 75 mg by mouth daily.  , Disp: , Rfl: ;  COMBIVENT 18-103 MCG/ACT inhaler, , Disp: , Rfl: ;  finasteride (PROSCAR) 5 MG tablet, Take 5 mg by mouth daily., Disp: , Rfl: ;  fish oil-omega-3 fatty acids 1000 MG capsule, Take 2 g by mouth daily.  , Disp: , Rfl:  furosemide (LASIX) 80 MG tablet, TAKE 1 TABLET (80 MG TOTAL) BY MOUTH 2 (TWO) TIMES DAILY., Disp: 180 tablet, Rfl: 1;  isosorbide mononitrate (IMDUR) 60 MG 24 hr tablet, Take 1 tablet every am and 0.5 tablet at bedtime, Disp: , Rfl: ;  levothyroxine (SYNTHROID, LEVOTHROID) 50 MCG tablet, TAKE 1 TABLET (50 MCG TOTAL) BY MOUTH DAILY., Disp: 90  tablet, Rfl: 1;  metolazone (ZAROXOLYN) 2.5 MG tablet, continuous as needed., Disp: , Rfl:  Multiple Vitamins-Minerals (MENS 50+ MULTI VITAMIN/MIN) TABS, Take by mouth daily., Disp: , Rfl: ;  multivitamin-lutein (OCUVITE-LUTEIN) CAPS, Take 1 capsule by mouth daily.  , Disp: , Rfl: ;  nitroGLYCERIN (NITROSTAT) 0.4 MG SL tablet, Place 0.4 mg under the tongue every 5 (five) minutes as needed.  , Disp: , Rfl:  promethazine-dextromethorphan (PROMETHAZINE-DM) 6.25-15 MG/5ML syrup, Take 5 mLs by mouth 4 (four) times daily as needed for cough., Disp: 118 mL, Rfl: 0;  ramipril (ALTACE) 2.5 MG tablet, Take 2 capsule every morning Take 1 capsule every evening, Disp: , Rfl: ;  ranitidine (ZANTAC) 300 MG tablet, Take 300 mg by mouth every 6 (six) hours., Disp: , Rfl: ;  spironolactone (ALDACTONE) 25 MG tablet, Take 25 mg by mouth daily.  , Disp: , Rfl:  tamsulosin (FLOMAX) 0.4 MG CAPS, Take 0.4 mg by mouth daily., Disp: , Rfl: ;  tiotropium (SPIRIVA HANDIHALER) 18 MCG inhalation capsule, Place 1 capsule (18 mcg total) into inhaler and inhale daily., Disp: 90 capsule, Rfl: 3  Allergies:  Allergies  Allergen Reactions  . Ace Inhibitors     REACTION: cough  . Penicillins Other (See Comments)    "childhood allergy"  . Tussionex Pennkinetic Er (Hydrocod Polst-Cpm Polst Er) Other (See Comments)    "caused prostate problems"  Past Medical History, Surgical history, Social history, and Family History were reviewed and updated.  Review of Systems: Constitutional:  Negative for fever, chills, night sweats, anorexia, weight loss, pain. Cardiovascular: no chest pain or dyspnea on exertion Respiratory: no cough, shortness of breath, or wheezing Neurological: negative Dermatological: negative ENT: negative Skin: Negative. Gastrointestinal: negative Genito-Urinary: negative Hematological and Lymphatic: negative Breast: negative Musculoskeletal: negative Remaining ROS negative. Physical Exam: Blood pressure  116/58, pulse 62, temperature 97.3 F (36.3 C), temperature source Oral, resp. rate 62, height 5\' 8"  (1.727 m), weight 267 lb 1.6 oz (121.156 kg). ECOG: 1 General appearance: alert Head: Normocephalic, without obvious abnormality, atraumatic Neck: no JVD, supple, symmetrical, trachea midline and thyroid not enlarged, symmetric, no tenderness/mass/nodules Lymph nodes: Cervical, supraclavicular, and axillary nodes normal. Heart:regular rate and rhythm, S1, S2 normal, no murmur, click, rub or gallop Lung:chest clear, no wheezing, rales, normal symmetric air entry Abdomin: soft, non-tender, without masses or organomegaly EXT:no erythema, induration, or nodules   Lab Results: Lab Results  Component Value Date   WBC 6.2 03/19/2013   HGB 12.9* 03/19/2013   HCT 41.2 03/19/2013   MCV 90.4 03/19/2013   PLT 126* 03/19/2013     Chemistry      Component Value Date/Time   NA 135 03/13/2013 1628   NA 136 10/02/2012 0937   K 4.7 03/13/2013 1628   K 3.7 10/02/2012 0937   CL 104 03/13/2013 1628   CL 94* 10/02/2012 0937   CO2 27 03/13/2013 1628   CO2 29 10/02/2012 0937   BUN 18 03/13/2013 1628   BUN 32.0* 10/02/2012 0937   CREATININE 0.9 03/13/2013 1628   CREATININE 1.3 10/02/2012 0937      Component Value Date/Time   CALCIUM 8.7 03/13/2013 1628   CALCIUM 9.5 10/02/2012 0937   ALKPHOS 78 03/13/2013 1628   ALKPHOS 110 10/02/2012 0937   AST 19 03/13/2013 1628   AST 21 10/02/2012 0937   ALT 16 03/13/2013 1628   ALT 23 10/02/2012 0937   BILITOT 0.7 03/13/2013 1628   BILITOT 1.00 10/02/2012 0937       Impression and Plan:  A 67 year old gentleman with the following issues:  1. Microcytic hypochromic anemia with a documented iron deficiency anemia  in May of 2013. This has resolved after IV iron and continues to have stable Hgb. The plan is to continue to follow him every 6 months and replace his Fe as needed.   2. GI blood loss: he is following with GI for that. It seems to have resolved now.   3. Follow up: in  6 months.    [facility], MD 4/9/20143:29 PM

## 2013-03-19 NOTE — Telephone Encounter (Signed)
gv and printed pt appt schdule for OCT

## 2013-04-11 DIAGNOSIS — I2510 Atherosclerotic heart disease of native coronary artery without angina pectoris: Secondary | ICD-10-CM | POA: Diagnosis not present

## 2013-04-11 DIAGNOSIS — I509 Heart failure, unspecified: Secondary | ICD-10-CM | POA: Diagnosis not present

## 2013-04-11 DIAGNOSIS — I719 Aortic aneurysm of unspecified site, without rupture: Secondary | ICD-10-CM | POA: Diagnosis not present

## 2013-04-11 DIAGNOSIS — R0602 Shortness of breath: Secondary | ICD-10-CM | POA: Diagnosis not present

## 2013-04-11 DIAGNOSIS — Z4502 Encounter for adjustment and management of automatic implantable cardiac defibrillator: Secondary | ICD-10-CM | POA: Diagnosis not present

## 2013-04-11 DIAGNOSIS — R6889 Other general symptoms and signs: Secondary | ICD-10-CM | POA: Diagnosis not present

## 2013-05-09 ENCOUNTER — Ambulatory Visit: Payer: Medicare Other | Admitting: Vascular Surgery

## 2013-05-09 ENCOUNTER — Other Ambulatory Visit: Payer: Medicare Other

## 2013-05-26 DIAGNOSIS — I2589 Other forms of chronic ischemic heart disease: Secondary | ICD-10-CM | POA: Diagnosis not present

## 2013-06-09 LAB — ICD DEVICE OBSERVATION

## 2013-06-18 ENCOUNTER — Ambulatory Visit (INDEPENDENT_AMBULATORY_CARE_PROVIDER_SITE_OTHER): Payer: Medicare Other | Admitting: Internal Medicine

## 2013-06-18 ENCOUNTER — Encounter: Payer: Self-pay | Admitting: Internal Medicine

## 2013-06-18 ENCOUNTER — Other Ambulatory Visit (INDEPENDENT_AMBULATORY_CARE_PROVIDER_SITE_OTHER): Payer: Medicare Other

## 2013-06-18 VITALS — BP 124/72 | HR 59 | Temp 98.4°F | Resp 16 | Wt 238.0 lb

## 2013-06-18 DIAGNOSIS — D509 Iron deficiency anemia, unspecified: Secondary | ICD-10-CM

## 2013-06-18 DIAGNOSIS — R05 Cough: Secondary | ICD-10-CM

## 2013-06-18 DIAGNOSIS — E039 Hypothyroidism, unspecified: Secondary | ICD-10-CM

## 2013-06-18 DIAGNOSIS — R7309 Other abnormal glucose: Secondary | ICD-10-CM | POA: Diagnosis not present

## 2013-06-18 DIAGNOSIS — E785 Hyperlipidemia, unspecified: Secondary | ICD-10-CM | POA: Insufficient documentation

## 2013-06-18 DIAGNOSIS — I10 Essential (primary) hypertension: Secondary | ICD-10-CM | POA: Diagnosis not present

## 2013-06-18 DIAGNOSIS — J209 Acute bronchitis, unspecified: Secondary | ICD-10-CM

## 2013-06-18 DIAGNOSIS — R059 Cough, unspecified: Secondary | ICD-10-CM

## 2013-06-18 LAB — CBC WITH DIFFERENTIAL/PLATELET
Basophils Absolute: 0 10*3/uL (ref 0.0–0.1)
Basophils Relative: 0.5 % (ref 0.0–3.0)
Eosinophils Absolute: 0.2 10*3/uL (ref 0.0–0.7)
Eosinophils Relative: 2.3 % (ref 0.0–5.0)
HCT: 43.2 % (ref 39.0–52.0)
Hemoglobin: 14.5 g/dL (ref 13.0–17.0)
Lymphocytes Relative: 11.2 % — ABNORMAL LOW (ref 12.0–46.0)
Lymphs Abs: 0.8 10*3/uL (ref 0.7–4.0)
MCHC: 33.5 g/dL (ref 30.0–36.0)
MCV: 92.3 fl (ref 78.0–100.0)
Monocytes Absolute: 0.8 10*3/uL (ref 0.1–1.0)
Monocytes Relative: 10.9 % (ref 3.0–12.0)
Neutro Abs: 5.3 10*3/uL (ref 1.4–7.7)
Neutrophils Relative %: 75.1 % (ref 43.0–77.0)
Platelets: 151 10*3/uL (ref 150.0–400.0)
RBC: 4.68 Mil/uL (ref 4.22–5.81)
RDW: 15.6 % — ABNORMAL HIGH (ref 11.5–14.6)
WBC: 7.1 10*3/uL (ref 4.5–10.5)

## 2013-06-18 LAB — LIPID PANEL
Cholesterol: 116 mg/dL (ref 0–200)
HDL: 40.7 mg/dL (ref >39.00)
LDL Cholesterol: 59 mg/dL (ref 0–99)
Total CHOL/HDL Ratio: 3
Triglycerides: 83 mg/dL (ref 0.0–149.0)
VLDL: 16.6 mg/dL (ref 0.0–40.0)

## 2013-06-18 LAB — BASIC METABOLIC PANEL
BUN: 16 mg/dL (ref 6–23)
CO2: 33 mEq/L — ABNORMAL HIGH (ref 19–32)
Calcium: 9.4 mg/dL (ref 8.4–10.5)
Chloride: 102 mEq/L (ref 96–112)
Creatinine, Ser: 0.9 mg/dL (ref 0.4–1.5)
GFR: 89.07 mL/min (ref >60.00)
Glucose, Bld: 105 mg/dL — ABNORMAL HIGH (ref 70–99)
Potassium: 4.8 mEq/L (ref 3.5–5.1)
Sodium: 138 mEq/L (ref 135–145)

## 2013-06-18 LAB — TSH: TSH: 3.81 u[IU]/mL (ref 0.35–5.50)

## 2013-06-18 LAB — HEMOGLOBIN A1C: Hgb A1c MFr Bld: 5.9 % (ref 4.6–6.5)

## 2013-06-18 MED ORDER — PROMETHAZINE-DM 6.25-15 MG/5ML PO SYRP: 5.0000 mL | ORAL_SOLUTION | Freq: Four times a day (QID) | ORAL | Status: DC | PRN

## 2013-06-18 MED ORDER — AZITHROMYCIN 500 MG PO TABS: 500.0000 mg | ORAL_TABLET | Freq: Every day | ORAL | Status: DC

## 2013-06-18 NOTE — Progress Notes (Signed)
Subjective:    Patient ID: [name], male    DOB: [birth year], 67 y.o.   MRN: [id number]  URI  This is a new problem. The current episode started in the past 7 days. The problem has been gradually worsening. There has been no fever. Associated symptoms include coughing (productive of yellow phlegm) and a sore throat. Pertinent negatives include no abdominal pain, chest pain, congestion, diarrhea, dysuria, ear pain, headaches, joint pain, joint swelling, nausea, neck pain, plugged ear sensation, rash, rhinorrhea, sinus pain, sneezing, swollen glands, vomiting or wheezing. He has tried nothing for the symptoms. The treatment provided no relief.      Review of Systems  Constitutional: Positive for chills. Negative for fever, diaphoresis, activity change, appetite change and fatigue.  HENT: Positive for sore throat. Negative for ear pain, nosebleeds, congestion, rhinorrhea, sneezing, trouble swallowing, neck pain and sinus pressure.   Eyes: Negative.   Respiratory: Positive for cough (productive of yellow phlegm). Negative for apnea, choking, chest tightness, shortness of breath, wheezing and stridor.   Cardiovascular: Negative.  Negative for chest pain, palpitations and leg swelling.  Gastrointestinal: Negative.  Negative for nausea, vomiting, abdominal pain and diarrhea.  Endocrine: Negative.  Negative for polydipsia, polyphagia and polyuria.  Genitourinary: Negative.  Negative for dysuria and difficulty urinating.  Musculoskeletal: Negative.  Negative for myalgias, back pain, joint pain, joint swelling, arthralgias and gait problem.  Skin: Negative.  Negative for rash.  Allergic/Immunologic: Negative.   Neurological: Negative.  Negative for dizziness, tremors, weakness, light-headedness and headaches.  Hematological: Negative.  Negative for adenopathy. Does not bruise/bleed easily.  Psychiatric/Behavioral: Negative.        Objective:   Physical Exam  Vitals  reviewed. Constitutional: He is oriented to person, place, and time. He appears well-developed and well-nourished.  Non-toxic appearance. He does not have a sickly appearance. He does not appear ill. No distress.  HENT:  Head: Normocephalic and atraumatic. No trismus in the jaw.  Right Ear: Hearing, tympanic membrane, external ear and ear canal normal.  Left Ear: Hearing, tympanic membrane, external ear and ear canal normal.  Nose: Nose normal. No mucosal edema or rhinorrhea. Right sinus exhibits no maxillary sinus tenderness and no frontal sinus tenderness. Left sinus exhibits no maxillary sinus tenderness and no frontal sinus tenderness.  Mouth/Throat: Oropharynx is clear and moist and mucous membranes are normal. Mucous membranes are not pale, not dry and not cyanotic. No oral lesions. No edematous. No oropharyngeal exudate, posterior oropharyngeal edema, posterior oropharyngeal erythema or tonsillar abscesses.  Eyes: Conjunctivae are normal. Right eye exhibits no discharge. Left eye exhibits no discharge. No scleral icterus.  Neck: Normal range of motion. Neck supple. No JVD present. No tracheal deviation present. No thyromegaly present.  Cardiovascular: Normal rate, regular rhythm, normal heart sounds and intact distal pulses.  Exam reveals no gallop and no friction rub.   No murmur heard. Pulmonary/Chest: Effort normal and breath sounds normal. No stridor. No respiratory distress. He has no wheezes. He has no rales. He exhibits no tenderness.  Abdominal: Soft. Bowel sounds are normal. He exhibits no distension and no mass. There is no tenderness. There is no rebound and no guarding.  Musculoskeletal: Normal range of motion. He exhibits no edema and no tenderness.  Lymphadenopathy:    He has no cervical adenopathy.  Neurological: He is oriented to person, place, and time.  Skin: Skin is warm and dry. No rash noted. He is not diaphoretic. No erythema. No pallor.  Psychiatric: He has a normal  mood and affect. His behavior is normal. Judgment and thought content normal.     Lab Results  Component Value Date   WBC 6.2 03/19/2013   HGB 12.9* 03/19/2013   HCT 41.2 03/19/2013   PLT 126* 03/19/2013   GLUCOSE 109* 03/13/2013   CHOL 118 04/18/2012   TRIG 51.0 04/18/2012   HDL 51.20 04/18/2012   LDLCALC 57 04/18/2012   ALT 16 03/13/2013   AST 19 03/13/2013   NA 135 03/13/2013   K 4.7 03/13/2013   CL 104 03/13/2013   CREATININE 0.9 03/13/2013   BUN 18 03/13/2013   CO2 27 03/13/2013   TSH 3.30 03/13/2013   PSA 0.51 04/18/2012   INR 1.02 01/03/2012   HGBA1C 6.0 03/13/2013       Assessment & Plan:

## 2013-06-18 NOTE — Assessment & Plan Note (Signed)
Check the TSH today and will adjust his dose if needed

## 2013-06-18 NOTE — Assessment & Plan Note (Signed)
I will recheck his A1C today and if he has developed DM 2 then I will treat it

## 2013-06-18 NOTE — Patient Instructions (Signed)

## 2013-06-18 NOTE — Assessment & Plan Note (Signed)
FLP today, adjustments if needed

## 2013-06-18 NOTE — Assessment & Plan Note (Signed)
I will recheck his CBC today and will address it if needed

## 2013-08-20 ENCOUNTER — Other Ambulatory Visit: Payer: Self-pay

## 2013-08-20 MED ORDER — ISOSORBIDE MONONITRATE ER 60 MG PO TB24: ORAL_TABLET | ORAL | Status: DC

## 2013-08-20 MED ORDER — SPIRONOLACTONE 25 MG PO TABS: 25.0000 mg | ORAL_TABLET | Freq: Every day | ORAL | Status: DC

## 2013-08-20 MED ORDER — ATORVASTATIN CALCIUM 40 MG PO TABS: 40.0000 mg | ORAL_TABLET | Freq: Every day | ORAL | Status: DC

## 2013-08-20 MED ORDER — FUROSEMIDE 80 MG PO TABS: 80.0000 mg | ORAL_TABLET | Freq: Two times a day (BID) | ORAL | Status: DC

## 2013-08-20 MED ORDER — CARVEDILOL 25 MG PO TABS: 25.0000 mg | ORAL_TABLET | Freq: Two times a day (BID) | ORAL | Status: DC

## 2013-08-20 MED ORDER — RAMIPRIL 2.5 MG PO CAPS: 2.5000 mg | ORAL_CAPSULE | Freq: Every day | ORAL | Status: DC

## 2013-08-20 NOTE — Telephone Encounter (Signed)
Rx was sent to pharmacy electronically via [employer].

## 2013-08-27 ENCOUNTER — Other Ambulatory Visit: Payer: Self-pay | Admitting: Cardiovascular Disease

## 2013-08-27 DIAGNOSIS — R6889 Other general symptoms and signs: Secondary | ICD-10-CM | POA: Diagnosis not present

## 2013-08-27 DIAGNOSIS — I447 Left bundle-branch block, unspecified: Secondary | ICD-10-CM | POA: Diagnosis not present

## 2013-08-27 DIAGNOSIS — I719 Aortic aneurysm of unspecified site, without rupture: Secondary | ICD-10-CM | POA: Diagnosis not present

## 2013-08-27 DIAGNOSIS — I2510 Atherosclerotic heart disease of native coronary artery without angina pectoris: Secondary | ICD-10-CM | POA: Diagnosis not present

## 2013-08-27 DIAGNOSIS — R5381 Other malaise: Secondary | ICD-10-CM | POA: Diagnosis not present

## 2013-08-27 LAB — PACEMAKER DEVICE OBSERVATION

## 2013-08-28 LAB — COMPREHENSIVE METABOLIC PANEL
ALT: 21 U/L (ref 0–53)
AST: 16 U/L (ref 0–37)
Albumin: 4.1 g/dL (ref 3.5–5.2)
Alkaline Phosphatase: 56 U/L (ref 39–117)
BUN: 29 mg/dL — ABNORMAL HIGH (ref 6–23)
CO2: 26 mEq/L (ref 19–32)
Calcium: 8.5 mg/dL (ref 8.4–10.5)
Chloride: 107 mEq/L (ref 96–112)
Creat: 0.89 mg/dL (ref 0.50–1.35)
Glucose, Bld: 92 mg/dL (ref 70–99)
Potassium: 4 mEq/L (ref 3.5–5.3)
Sodium: 142 mEq/L (ref 135–145)
Total Bilirubin: 0.7 mg/dL (ref 0.3–1.2)
Total Protein: 6.4 g/dL (ref 6.0–8.3)

## 2013-08-28 LAB — CBC WITH DIFFERENTIAL/PLATELET
Basophils Absolute: 0 10*3/uL (ref 0.0–0.1)
Basophils Relative: 0 % (ref 0–1)
Eosinophils Absolute: 0.3 10*3/uL (ref 0.0–0.7)
Eosinophils Relative: 4 % (ref 0–5)
HCT: 40.3 % (ref 39.0–52.0)
Hemoglobin: 12.9 g/dL — ABNORMAL LOW (ref 13.0–17.0)
Lymphocytes Relative: 20 % (ref 12–46)
Lymphs Abs: 1.4 10*3/uL (ref 0.7–4.0)
MCH: 30.3 pg (ref 26.0–34.0)
MCHC: 32 g/dL (ref 30.0–36.0)
MCV: 94.6 fL (ref 78.0–100.0)
Monocytes Absolute: 0.8 10*3/uL (ref 0.1–1.0)
Monocytes Relative: 12 % (ref 3–12)
Neutro Abs: 4.6 10*3/uL (ref 1.7–7.7)
Neutrophils Relative %: 64 % (ref 43–77)
Platelets: 161 10*3/uL (ref 150–400)
RBC: 4.26 MIL/uL (ref 4.22–5.81)
RDW: 13.9 % (ref 11.5–15.5)
WBC: 7.2 10*3/uL (ref 4.0–10.5)

## 2013-09-04 ENCOUNTER — Encounter: Payer: Self-pay | Admitting: Cardiovascular Disease

## 2013-09-06 ENCOUNTER — Other Ambulatory Visit: Payer: Self-pay | Admitting: Internal Medicine

## 2013-09-15 ENCOUNTER — Telehealth: Payer: Self-pay | Admitting: Oncology

## 2013-09-15 NOTE — Telephone Encounter (Signed)
Moved 10/9 lb/KC to 10/10. lmonvm for pt re change w/new d/t for 10/10 appt. Schedule mailed.

## 2013-09-16 ENCOUNTER — Encounter: Payer: Self-pay | Admitting: Cardiovascular Disease

## 2013-09-17 ENCOUNTER — Telehealth: Payer: Self-pay | Admitting: Oncology

## 2013-09-17 NOTE — Telephone Encounter (Signed)
Pt wife called to r/s 10/10 appt due to pt being out of town. Wife given appt for 10/24 lb/fu - next day KC in offive.

## 2013-09-18 ENCOUNTER — Other Ambulatory Visit: Payer: Medicare Other | Admitting: Lab

## 2013-09-18 ENCOUNTER — Ambulatory Visit: Payer: Medicare Other | Admitting: Oncology

## 2013-09-19 ENCOUNTER — Ambulatory Visit: Payer: Medicare Other | Admitting: Oncology

## 2013-09-19 ENCOUNTER — Other Ambulatory Visit: Payer: Medicare Other | Admitting: Lab

## 2013-09-22 ENCOUNTER — Other Ambulatory Visit: Payer: Self-pay | Admitting: *Deleted

## 2013-09-22 MED ORDER — NITROGLYCERIN 0.4 MG SL SUBL: 0.4000 mg | SUBLINGUAL_TABLET | SUBLINGUAL | Status: DC | PRN

## 2013-09-22 NOTE — Telephone Encounter (Signed)
Rx was sent to pharmacy electronically. 

## 2013-09-23 ENCOUNTER — Encounter: Payer: Self-pay | Admitting: Internal Medicine

## 2013-09-23 ENCOUNTER — Ambulatory Visit (INDEPENDENT_AMBULATORY_CARE_PROVIDER_SITE_OTHER): Payer: Medicare Other | Admitting: Internal Medicine

## 2013-09-23 VITALS — BP 140/60 | HR 57 | Temp 98.1°F | Wt 242.0 lb

## 2013-09-23 DIAGNOSIS — J209 Acute bronchitis, unspecified: Secondary | ICD-10-CM | POA: Diagnosis not present

## 2013-09-23 MED ORDER — AZITHROMYCIN 500 MG PO TABS: 500.0000 mg | ORAL_TABLET | Freq: Every day | ORAL | Status: DC

## 2013-09-23 MED ORDER — PROMETHAZINE-DM 6.25-15 MG/5ML PO SYRP: 5.0000 mL | ORAL_SOLUTION | Freq: Four times a day (QID) | ORAL | Status: DC | PRN

## 2013-09-23 NOTE — Assessment & Plan Note (Signed)
Mr. Neitzke presents with recurrent symptoms similar to previous bouts of bronchitis  Plan Azithromycin 500 mg daily x 3  Promethazine DM  Hydrate  Continue all your medications.

## 2013-09-23 NOTE — Progress Notes (Signed)
Subjective:    Patient ID: [name], male    DOB: 07/30/1946, 67 y.o.   MRN: [id number]  HPI [name] is seen as an acute patient for cough that produces a brown sputum for the past 3 days. He reports a h/o recurrent bronchitis. These symptoms today are very similar to previous episodes. He has not had any fever, no SOB, fatigue. He does have OSA and h/o COPD  . Past Medical History  Diagnosis Date  . Myocardial infarction   . Hypertension   . CHF (congestive heart failure)   . COPD (chronic obstructive pulmonary disease)   . GERD (gastroesophageal reflux disease)   . AAA (abdominal aortic aneurysm)   . Tremor   . Sleep apnea   . Dysrhythmia     ICD-defibrillator  . Diabetes mellitus     DIET CONTROLLED  . Carotid artery stenosis   . Peripheral vascular disease   . Adenomatous colon polyp 01/2004  . Diverticulosis   . Anemia   . AVM (arteriovenous malformation)   . CAD (coronary artery disease)   . HLD (hyperlipidemia)   . Hypothyroidism   . BPH (benign prostatic hypertrophy)   . Fatigue    Past Surgical History  Procedure Laterality Date  . Coronary artery bypass graft    . Cardiac catheterization    . Coronary angioplasty    . Renal artery stent    . Angioplasty      BILATERAL  LE  W/STENTS  . Endarterectomy  01/04/2012    Procedure: ENDARTERECTOMY CAROTID;  Surgeon: Hinda Lenis, MD;  Location: [place];  Service: Vascular;  Laterality: Left;  with patch angioplasty  . Carotid endarterectomy  01/04/12  . Cardiac defibrillator placement    . Iron infusion  June 16, 2012   Family History  Problem Relation Age of Onset  . Colon cancer Sister   . Diabetes Sister   . Heart disease Sister   . Heart disease Brother   . Lung cancer Mother   . Cancer Mother   . Heart attack Maternal Grandmother   . Colon polyps Sister    History   Social History  . Marital Status: Married    Spouse Name: [name]    Number of Children: 2  . Years of Education: [name]    Occupational History  . retired Engineer, structural    Social History Main Topics  . Smoking status: Former Smoker -- 1.50 packs/day for 40 years    Types: Cigarettes    Quit date: 12/11/2002  . Smokeless tobacco: Never Used  . Alcohol Use: No  . Drug Use: No  . Sexual Activity: Not Currently   Other Topics Concern  . Not on file   Social History Narrative  . No narrative on file    Current Outpatient Prescriptions on File Prior to Visit  Medication Sig Dispense Refill  . amiodarone (CORDARONE) 200 MG tablet Take 200 mg by mouth daily.       Marland Kitchen aspirin 81 MG tablet Take 81 mg by mouth daily.      Marland Kitchen atorvastatin (LIPITOR) 40 MG tablet Take 1 tablet (40 mg total) by mouth daily.  90 tablet  3  . azithromycin (ZITHROMAX) 500 MG tablet Take 1 tablet (500 mg total) by mouth daily.  3 tablet  0  . carvedilol (COREG) 25 MG tablet Take 1 tablet (25 mg total) by mouth 2 (two) times daily with a meal.  180 tablet  3  . clopidogrel (  PLAVIX) 75 MG tablet Take 75 mg by mouth daily.        . finasteride (PROSCAR) 5 MG tablet Take 5 mg by mouth daily.      . fish oil-omega-3 fatty acids 1000 MG capsule Take 2 g by mouth daily.        . furosemide (LASIX) 80 MG tablet Take 1 tablet (80 mg total) by mouth 2 (two) times daily.  180 tablet  3  . isosorbide mononitrate (IMDUR) 60 MG 24 hr tablet Take 1 tablet every am and 0.5 tablet at bedtime  135 tablet  3  . levothyroxine (SYNTHROID, LEVOTHROID) 50 MCG tablet TAKE 1 TABLET (50 MCG TOTAL) BY MOUTH DAILY.  90 tablet  1  . metolazone (ZAROXOLYN) 2.5 MG tablet continuous as needed.      . Multiple Vitamins-Minerals (MENS 50+ MULTI VITAMIN/MIN) TABS Take by mouth daily.      . multivitamin-lutein (OCUVITE-LUTEIN) CAPS Take 1 capsule by mouth daily.        . nitroGLYCERIN (NITROSTAT) 0.4 MG SL tablet Place 1 tablet (0.4 mg total) under the tongue every 5 (five) minutes as needed.  25 tablet  3  . promethazine-dextromethorphan (PROMETHAZINE-DM) 6.25-15  MG/5ML syrup Take 5 mLs by mouth 4 (four) times daily as needed for cough.  118 mL  0  . ramipril (ALTACE) 2.5 MG capsule Take 1 capsule (2.5 mg total) by mouth daily.  90 capsule  3  . ranitidine (ZANTAC) 300 MG tablet Take 300 mg by mouth every 6 (six) hours.      Marland Kitchen spironolactone (ALDACTONE) 25 MG tablet Take 1 tablet (25 mg total) by mouth daily.  90 tablet  3  . tamsulosin (FLOMAX) 0.4 MG CAPS Take 0.4 mg by mouth daily.      Marland Kitchen tiotropium (SPIRIVA HANDIHALER) 18 MCG inhalation capsule Place 1 capsule (18 mcg total) into inhaler and inhale daily.  90 capsule  3   No current facility-administered medications on file prior to visit.      Review of Systems System review is negative for any constitutional, cardiac, pulmonary, GI or neuro symptoms or complaints other than as described in the HPI.     Objective:   Physical Exam Filed Vitals:   09/23/13 1656  BP: 140/60  Pulse: 57  Temp: 98.1 F (36.7 C)   Wt Readings from Last 3 Encounters:  09/23/13 242 lb (109.77 kg)  06/18/13 238 lb (107.956 kg)  03/19/13 267 lb 1.6 oz (121.156 kg)   HEENT- TMs normal, throat clear Nodes - negative Cor - RRR PUlm - no increased WOB, no rales, no wheezes.         Assessment & Plan:

## 2013-09-23 NOTE — Patient Instructions (Signed)
1. Recurrent bronchitis - similar to previous episodes.  Plan Azithromycin 500 mg daily x 3  Promethazine DM  Hydrate  Continue all your medications.

## 2013-10-03 ENCOUNTER — Telehealth: Payer: Self-pay | Admitting: Oncology

## 2013-10-03 ENCOUNTER — Ambulatory Visit (HOSPITAL_BASED_OUTPATIENT_CLINIC_OR_DEPARTMENT_OTHER): Payer: Medicare Other | Admitting: Oncology

## 2013-10-03 ENCOUNTER — Other Ambulatory Visit (HOSPITAL_BASED_OUTPATIENT_CLINIC_OR_DEPARTMENT_OTHER): Payer: Medicare Other | Admitting: Lab

## 2013-10-03 ENCOUNTER — Encounter: Payer: Self-pay | Admitting: Oncology

## 2013-10-03 VITALS — BP 128/61 | HR 51 | Temp 97.0°F | Resp 20 | Ht 68.0 in | Wt 231.9 lb

## 2013-10-03 DIAGNOSIS — D509 Iron deficiency anemia, unspecified: Secondary | ICD-10-CM

## 2013-10-03 DIAGNOSIS — K922 Gastrointestinal hemorrhage, unspecified: Secondary | ICD-10-CM | POA: Diagnosis not present

## 2013-10-03 DIAGNOSIS — D649 Anemia, unspecified: Secondary | ICD-10-CM

## 2013-10-03 LAB — CBC WITH DIFFERENTIAL/PLATELET
BASO%: 0.6 % (ref 0.0–2.0)
Basophils Absolute: 0 10*3/uL (ref 0.0–0.1)
EOS%: 3.2 % (ref 0.0–7.0)
Eosinophils Absolute: 0.2 10*3/uL (ref 0.0–0.5)
HCT: 42.1 % (ref 38.4–49.9)
HGB: 13.8 g/dL (ref 13.0–17.1)
LYMPH%: 18 % (ref 14.0–49.0)
MCH: 30.8 pg (ref 27.2–33.4)
MCHC: 32.8 g/dL (ref 32.0–36.0)
MCV: 94 fL (ref 79.3–98.0)
MONO#: 0.4 10*3/uL (ref 0.1–0.9)
MONO%: 7.4 % (ref 0.0–14.0)
NEUT#: 4.1 10*3/uL (ref 1.5–6.5)
NEUT%: 70.8 % (ref 39.0–75.0)
Platelets: 160 10*3/uL (ref 140–400)
RBC: 4.48 10*6/uL (ref 4.20–5.82)
RDW: 13.2 % (ref 11.0–14.6)
WBC: 5.9 10*3/uL (ref 4.0–10.3)
lymph#: 1.1 10*3/uL (ref 0.9–3.3)

## 2013-10-03 LAB — COMPREHENSIVE METABOLIC PANEL (CC13)
ALT: 18 U/L (ref 0–55)
AST: 15 U/L (ref 5–34)
Albumin: 4 g/dL (ref 3.5–5.0)
Alkaline Phosphatase: 83 U/L (ref 40–150)
Anion Gap: 9 mEq/L (ref 3–11)
BUN: 22.4 mg/dL (ref 7.0–26.0)
CO2: 28 mEq/L (ref 22–29)
Calcium: 9.9 mg/dL (ref 8.4–10.4)
Chloride: 103 mEq/L (ref 98–109)
Creatinine: 1 mg/dL (ref 0.7–1.3)
Glucose: 141 mg/dl — ABNORMAL HIGH (ref 70–140)
Potassium: 5.1 mEq/L (ref 3.5–5.1)
Sodium: 139 mEq/L (ref 136–145)
Total Bilirubin: 0.99 mg/dL (ref 0.20–1.20)
Total Protein: 7.6 g/dL (ref 6.4–8.3)

## 2013-10-03 LAB — IRON AND TIBC CHCC
%SAT: 22 % (ref 20–55)
Iron: 83 ug/dL (ref 42–163)
TIBC: 385 ug/dL (ref 202–409)
UIBC: 302 ug/dL (ref 117–376)

## 2013-10-03 LAB — FERRITIN CHCC: Ferritin: 30 ng/ml (ref 22–316)

## 2013-10-03 NOTE — Telephone Encounter (Signed)
[facility] adv appt 07/2014 per 10/03/13 POF shh

## 2013-10-03 NOTE — Progress Notes (Signed)
Hematology and Oncology Follow Up Visit  [name] [id number] 1946/11/29 67 y.o. 10/03/2013 2:21 PM   Principle Diagnosis: 67 year old with iron deficiency anemia diagnosed in 06/2012. This is likely due to GI  loss from AVMs  Prior Therapy: S/P Feraheme in 06/2012 with normalization of his Fe since  Interim History:  [name] presents today for a follow up visit. He tolerated IV iron without complications and felt a lot better since.  Clinically he had not reported any progressive symptoms of weakness, fatigue, tiredness, dyspnea on exertion, weight gain. He had not reported any active bleeding. He does report some occasional hematochezia but no overt bleeding at this time. His energy level and activity is back to base line.    Medications: I have reviewed the patient's current medications. Current outpatient prescriptions:amiodarone (CORDARONE) 200 MG tablet, Take 200 mg by mouth daily. , Disp: , Rfl: ;  aspirin 81 MG tablet, Take 81 mg by mouth daily., Disp: , Rfl: ;  atorvastatin (LIPITOR) 40 MG tablet, Take 1 tablet (40 mg total) by mouth daily., Disp: 90 tablet, Rfl: 3;  azithromycin (ZITHROMAX) 500 MG tablet, Take 1 tablet (500 mg total) by mouth daily., Disp: 3 tablet, Rfl: 0 azithromycin (ZITHROMAX) 500 MG tablet, Take 1 tablet (500 mg total) by mouth daily., Disp: 3 tablet, Rfl: 0;  carvedilol (COREG) 25 MG tablet, Take 1 tablet (25 mg total) by mouth 2 (two) times daily with a meal., Disp: 180 tablet, Rfl: 3;  clopidogrel (PLAVIX) 75 MG tablet, Take 75 mg by mouth daily.  , Disp: , Rfl: ;  finasteride (PROSCAR) 5 MG tablet, Take 5 mg by mouth daily., Disp: , Rfl:  fish oil-omega-3 fatty acids 1000 MG capsule, Take 2 g by mouth daily.  , Disp: , Rfl: ;  furosemide (LASIX) 80 MG tablet, Take 1 tablet (80 mg total) by mouth 2 (two) times daily., Disp: 180 tablet, Rfl: 3;  isosorbide mononitrate (IMDUR) 60 MG 24 hr tablet, Take 1 tablet every am and 0.5 tablet at bedtime, Disp: 135 tablet,  Rfl: 3 levothyroxine (SYNTHROID, LEVOTHROID) 50 MCG tablet, TAKE 1 TABLET (50 MCG TOTAL) BY MOUTH DAILY., Disp: 90 tablet, Rfl: 1;  metolazone (ZAROXOLYN) 2.5 MG tablet, continuous as needed., Disp: , Rfl: ;  Multiple Vitamins-Minerals (MENS 50+ MULTI VITAMIN/MIN) TABS, Take by mouth daily., Disp: , Rfl: ;  multivitamin-lutein (OCUVITE-LUTEIN) CAPS, Take 1 capsule by mouth daily.  , Disp: , Rfl:  nitroGLYCERIN (NITROSTAT) 0.4 MG SL tablet, Place 1 tablet (0.4 mg total) under the tongue every 5 (five) minutes as needed., Disp: 25 tablet, Rfl: 3;  promethazine-dextromethorphan (PROMETHAZINE-DM) 6.25-15 MG/5ML syrup, Take 5 mLs by mouth 4 (four) times daily as needed for cough., Disp: 118 mL, Rfl: 0 promethazine-dextromethorphan (PROMETHAZINE-DM) 6.25-15 MG/5ML syrup, Take 5 mLs by mouth 4 (four) times daily as needed for cough., Disp: 118 mL, Rfl: 2;  ramipril (ALTACE) 2.5 MG capsule, Take 1 capsule (2.5 mg total) by mouth daily., Disp: 90 capsule, Rfl: 3;  ranitidine (ZANTAC) 300 MG tablet, Take 300 mg by mouth every 6 (six) hours., Disp: , Rfl:  spironolactone (ALDACTONE) 25 MG tablet, Take 1 tablet (25 mg total) by mouth daily., Disp: 90 tablet, Rfl: 3;  tiotropium (SPIRIVA HANDIHALER) 18 MCG inhalation capsule, Place 1 capsule (18 mcg total) into inhaler and inhale daily., Disp: 90 capsule, Rfl: 3  Allergies:  Allergies  Allergen Reactions  . Ace Inhibitors     REACTION: cough  . Penicillins Other (See Comments)    "  childhood allergy"  . Tussionex Pennkinetic Er [Hydrocod Polst-Cpm Polst Er] Other (See Comments)    "caused prostate problems"    Past Medical History, Surgical history, Social history, and Family History were reviewed and updated.  Review of Systems: Constitutional:  Negative for fever, chills, night sweats, anorexia, weight loss, pain. Cardiovascular: no chest pain or dyspnea on exertion Respiratory: no cough, shortness of breath, or wheezing Neurological:  negative Dermatological: negative ENT: negative Skin: Negative. Gastrointestinal: negative Genito-Urinary: negative Hematological and Lymphatic: negative Breast: negative Musculoskeletal: negative Remaining ROS negative.   Physical Exam: Blood pressure 128/61, pulse 51, temperature 97 F (36.1 C), temperature source Oral, resp. rate 20, height 5\' 8"  (1.727 m), weight 231 lb 14.4 oz (105.189 kg). ECOG: 1 General appearance: alert Head: Normocephalic, without obvious abnormality, atraumatic Neck: no JVD, supple, symmetrical, trachea midline and thyroid not enlarged, symmetric, no tenderness/mass/nodules Lymph nodes: Cervical, supraclavicular, and axillary nodes normal. Heart:regular rate and rhythm, S1, S2 normal, no murmur, click, rub or gallop Lung:chest clear, no wheezing, rales, normal symmetric air entry Abdomen: soft, non-tender, without masses or organomegaly EXT:no erythema, induration, or nodules   Lab Results: Lab Results  Component Value Date   WBC 5.9 10/03/2013   HGB 13.8 10/03/2013   HCT 42.1 10/03/2013   MCV 94.0 10/03/2013   PLT 160 10/03/2013     Chemistry      Component Value Date/Time   NA 139 10/03/2013 1036   NA 142 08/27/2013 1543   K 5.1 10/03/2013 1036   K 4.0 08/27/2013 1543   CL 107 08/27/2013 1543   CL 94* 10/02/2012 0937   CO2 28 10/03/2013 1036   CO2 26 08/27/2013 1543   BUN 22.4 10/03/2013 1036   BUN 29* 08/27/2013 1543   CREATININE 1.0 10/03/2013 1036   CREATININE 0.89 08/27/2013 1543   CREATININE 0.9 06/18/2013 1033      Component Value Date/Time   CALCIUM 9.9 10/03/2013 1036   CALCIUM 8.5 08/27/2013 1543   ALKPHOS 83 10/03/2013 1036   ALKPHOS 56 08/27/2013 1543   AST 15 10/03/2013 1036   AST 16 08/27/2013 1543   ALT 18 10/03/2013 1036   ALT 21 08/27/2013 1543   BILITOT 0.99 10/03/2013 1036   BILITOT 0.7 08/27/2013 1543       Impression and Plan:  A 67 year old gentleman with the following issues:  1. Microcytic hypochromic anemia  with a documented iron deficiency anemia  in May of 2013. This has resolved after IV iron and continues to have stable Hgb. The plan is to continue to follow him every 6 months and replace his Fe as needed.   2. GI blood loss: he is following with GI for that. It seems to have resolved now.   3. Follow up: in 6 months.    Ayham Word 10/24/20142:21 PM

## 2013-10-14 ENCOUNTER — Other Ambulatory Visit: Payer: Self-pay | Admitting: Neurosurgery

## 2013-10-14 DIAGNOSIS — I714 Abdominal aortic aneurysm, without rupture: Secondary | ICD-10-CM

## 2013-10-16 ENCOUNTER — Other Ambulatory Visit: Payer: Self-pay

## 2013-10-22 ENCOUNTER — Encounter (INDEPENDENT_AMBULATORY_CARE_PROVIDER_SITE_OTHER): Payer: Medicare Other | Admitting: Ophthalmology

## 2013-10-22 DIAGNOSIS — H35039 Hypertensive retinopathy, unspecified eye: Secondary | ICD-10-CM | POA: Diagnosis not present

## 2013-10-22 DIAGNOSIS — I10 Essential (primary) hypertension: Secondary | ICD-10-CM

## 2013-10-22 DIAGNOSIS — H43819 Vitreous degeneration, unspecified eye: Secondary | ICD-10-CM | POA: Diagnosis not present

## 2013-10-22 DIAGNOSIS — H3530 Unspecified macular degeneration: Secondary | ICD-10-CM

## 2013-10-22 DIAGNOSIS — H2510 Age-related nuclear cataract, unspecified eye: Secondary | ICD-10-CM

## 2013-10-29 ENCOUNTER — Ambulatory Visit: Payer: Medicare Other | Admitting: Internal Medicine

## 2013-10-30 ENCOUNTER — Encounter: Payer: Self-pay | Admitting: Family

## 2013-10-31 ENCOUNTER — Ambulatory Visit (HOSPITAL_COMMUNITY)
Admission: RE | Admit: 2013-10-31 | Discharge: 2013-10-31 | Disposition: A | Discharge status: Home / Self Care | Payer: Medicare Other | Source: Ambulatory Visit | Attending: Family | Admitting: Family

## 2013-10-31 ENCOUNTER — Ambulatory Visit: Payer: Medicare Other | Admitting: Vascular Surgery

## 2013-10-31 ENCOUNTER — Other Ambulatory Visit: Payer: Medicare Other

## 2013-10-31 ENCOUNTER — Encounter: Payer: Self-pay | Admitting: Family

## 2013-10-31 ENCOUNTER — Other Ambulatory Visit: Payer: Self-pay | Admitting: Vascular Surgery

## 2013-10-31 ENCOUNTER — Ambulatory Visit (INDEPENDENT_AMBULATORY_CARE_PROVIDER_SITE_OTHER)
Admission: RE | Admit: 2013-10-31 | Discharge: 2013-10-31 | Disposition: A | Discharge status: Home / Self Care | Payer: Medicare Other | Source: Ambulatory Visit | Attending: Neurosurgery | Admitting: Neurosurgery

## 2013-10-31 ENCOUNTER — Ambulatory Visit (INDEPENDENT_AMBULATORY_CARE_PROVIDER_SITE_OTHER): Payer: Medicare Other | Admitting: Family

## 2013-10-31 VITALS — BP 121/79 | HR 51 | Resp 22 | Ht 68.0 in | Wt 247.0 lb

## 2013-10-31 DIAGNOSIS — I714 Abdominal aortic aneurysm, without rupture, unspecified: Secondary | ICD-10-CM

## 2013-10-31 DIAGNOSIS — I6529 Occlusion and stenosis of unspecified carotid artery: Secondary | ICD-10-CM

## 2013-10-31 DIAGNOSIS — Z48812 Encounter for surgical aftercare following surgery on the circulatory system: Secondary | ICD-10-CM | POA: Insufficient documentation

## 2013-10-31 DIAGNOSIS — I658 Occlusion and stenosis of other precerebral arteries: Secondary | ICD-10-CM | POA: Diagnosis not present

## 2013-10-31 DIAGNOSIS — I6523 Occlusion and stenosis of bilateral carotid arteries: Secondary | ICD-10-CM

## 2013-10-31 LAB — CREATININE, SERUM: Creat: 0.75 mg/dL (ref 0.50–1.35)

## 2013-10-31 LAB — BUN: BUN: 22 mg/dL (ref 6–23)

## 2013-10-31 NOTE — Addendum Note (Signed)
Addended by: Thresa Ross C on: 10/31/2013 11:39 AM   Modules accepted: Orders

## 2013-10-31 NOTE — Progress Notes (Signed)
[facility] [place]  Established Abdominal Aortic Aneurysm  History of Present Illness  [name] is a 67 y.o. (May 03, 1946) male who presents with chief complaint: follow up for AAA.  Previous studies demonstrate an AAA, measuring 4.9 cm a year ago.  The patient does not have back or abdominal pain.  The patient is not a smoker. He also is s/p left CEA on 01/04/2012 by Dr. Bridgett Larsson. The patient not claudication in legs with walking, denies non-healing wounds. The patient denies history of stroke or TIA symptoms. Has had 2 MI's, CABG x 3 vessels in the 1980's, since then has had numerous cardiac stents, has a defibrillator in place, has CHF. Has renal stent placed by his cardiologist long ago, per pt. Walks 1.5 miles daily.  Pt Diabetic: No Pt smoker: former smoker, quit 10 years ago  Past Medical History  Diagnosis Date  . Myocardial infarction   . Hypertension   . CHF (congestive heart failure)   . COPD (chronic obstructive pulmonary disease)   . GERD (gastroesophageal reflux disease)   . AAA (abdominal aortic aneurysm)   . Tremor   . Sleep apnea   . Dysrhythmia     ICD-defibrillator  . Diabetes mellitus     DIET CONTROLLED  . Carotid artery stenosis   . Peripheral vascular disease   . Adenomatous colon polyp 01/2004  . Diverticulosis   . Anemia   . AVM (arteriovenous malformation)   . CAD (coronary artery disease)   . HLD (hyperlipidemia)   . Hypothyroidism   . BPH (benign prostatic hypertrophy)   . Fatigue    Past Surgical History  Procedure Laterality Date  . Coronary artery bypass graft    . Cardiac catheterization    . Coronary angioplasty    . Renal artery stent    . Angioplasty      BILATERAL  LE  W/STENTS  . Endarterectomy  01/04/2012    Procedure: ENDARTERECTOMY CAROTID;  Surgeon: Hinda Lenis, MD;  Location: [facility];  Service: Vascular;  Laterality: Left;  with patch angioplasty  . Carotid endarterectomy  01/04/12  . Cardiac  defibrillator placement    . Iron infusion  June 16, 2012   Social History History   Social History  . Marital Status: Married    Spouse Name: [name]    Number of Children: 2  . Years of Education: [name]   Occupational History  . retired Engineer, structural    Social History Main Topics  . Smoking status: Former Smoker -- 1.50 packs/day for 40 years    Types: Cigarettes    Quit date: 12/11/2002  . Smokeless tobacco: Never Used  . Alcohol Use: No  . Drug Use: No  . Sexual Activity: Not Currently   Other Topics Concern  . Not on file   Social History Narrative  . No narrative on file   Family History Family History  Problem Relation Age of Onset  . Colon cancer Sister   . Diabetes Sister   . Heart disease Sister   . Heart disease Brother   . Lung cancer Mother   . Cancer Mother   . Heart attack Maternal Grandmother   . Colon polyps Sister     Current Outpatient Prescriptions on File Prior to Visit  Medication Sig Dispense Refill  . amiodarone (CORDARONE) 200 MG tablet Take 200 mg by mouth daily.       Marland Kitchen aspirin 81 MG tablet Take 81 mg by mouth daily.      Marland Kitchen  atorvastatin (LIPITOR) 40 MG tablet Take 1 tablet (40 mg total) by mouth daily.  90 tablet  3  . carvedilol (COREG) 25 MG tablet Take 1 tablet (25 mg total) by mouth 2 (two) times daily with a meal.  180 tablet  3  . clopidogrel (PLAVIX) 75 MG tablet Take 75 mg by mouth daily.        . finasteride (PROSCAR) 5 MG tablet Take 5 mg by mouth daily.      . fish oil-omega-3 fatty acids 1000 MG capsule Take 2 g by mouth daily.        . furosemide (LASIX) 80 MG tablet Take 1 tablet (80 mg total) by mouth 2 (two) times daily.  180 tablet  3  . isosorbide mononitrate (IMDUR) 60 MG 24 hr tablet Take 1 tablet every am and 0.5 tablet at bedtime  135 tablet  3  . levothyroxine (SYNTHROID, LEVOTHROID) 50 MCG tablet TAKE 1 TABLET (50 MCG TOTAL) BY MOUTH DAILY.  90 tablet  1  . metolazone (ZAROXOLYN) 2.5 MG tablet continuous as needed.       . Multiple Vitamins-Minerals (MENS 50+ MULTI VITAMIN/MIN) TABS Take by mouth daily.      . multivitamin-lutein (OCUVITE-LUTEIN) CAPS Take 1 capsule by mouth daily.        . nitroGLYCERIN (NITROSTAT) 0.4 MG SL tablet Place 1 tablet (0.4 mg total) under the tongue every 5 (five) minutes as needed.  25 tablet  3  . ramipril (ALTACE) 2.5 MG capsule Take 1 capsule (2.5 mg total) by mouth daily.  90 capsule  3  . ranitidine (ZANTAC) 300 MG tablet Take 300 mg by mouth every 6 (six) hours.      Marland Kitchen spironolactone (ALDACTONE) 25 MG tablet Take 1 tablet (25 mg total) by mouth daily.  90 tablet  3  . tiotropium (SPIRIVA HANDIHALER) 18 MCG inhalation capsule Place 1 capsule (18 mcg total) into inhaler and inhale daily.  90 capsule  3  . azithromycin (ZITHROMAX) 500 MG tablet Take 1 tablet (500 mg total) by mouth daily.  3 tablet  0  . azithromycin (ZITHROMAX) 500 MG tablet Take 1 tablet (500 mg total) by mouth daily.  3 tablet  0  . promethazine-dextromethorphan (PROMETHAZINE-DM) 6.25-15 MG/5ML syrup Take 5 mLs by mouth 4 (four) times daily as needed for cough.  118 mL  0  . promethazine-dextromethorphan (PROMETHAZINE-DM) 6.25-15 MG/5ML syrup Take 5 mLs by mouth 4 (four) times daily as needed for cough.  118 mL  2   No current facility-administered medications on file prior to visit.   Allergies  Allergen Reactions  . Ace Inhibitors     REACTION: cough  . Penicillins Other (See Comments)    "childhood allergy"  . Tussionex Pennkinetic Er [Hydrocod Polst-Cpm Polst Er] Other (See Comments)    "caused prostate problems"    ROS: [x]  Positive   [ ]  Negative   [ ]  All sytems reviewed and are negative  General: [ ]  Weight loss, [ ]  Fever, [ ]  chills Neurologic: [ ]  Dizziness, [ ]  Blackouts, [ ]  Seizure [ ]  Stroke, [ ]  "Mini stroke", [ ]  Slurred speech, [ ]  Temporary blindness; [ ]  weakness in arms or legs, [ ]  Hoarseness Cardiac: [ ]  Chest pain/pressure, [ ]  Shortness of breath at rest [ ]  Shortness of  breath with exertion, [ ]  Atrial fibrillation or irregular heartbeat Vascular: [ ]  Pain in legs with walking, [ ]  Pain in legs at rest, [ ]  Pain in  legs at night,  [ ]  Non-healing ulcer, [ ]  Blood clot in vein/DVT,   Pulmonary: [ ]  Home oxygen, [ ]  Productive cough, [ ]  Coughing up blood, [ ]  Asthma,  [ ]  Wheezing Musculoskeletal:  [ ]  Arthritis, [ ]  Low back pain, [ ]  Joint pain Hematologic: [ ]  Easy Bruising, [ ]  Anemia; [ ]  Hepatitis Gastrointestinal: [ ]  Blood in stool, [ ]  Gastroesophageal Reflux/heartburn, [ ]  Trouble swallowing Urinary: [ ]  chronic Kidney disease, [ ]  on HD - [ ]  MWF or [ ]  TTHS, [ ]  Burning with urination, [ ]  Difficulty urinating Skin: [ ]  Rashes, [ ]  Wounds Psychological: [ ]  Anxiety, [ ]  Depression  Physical Examination  Filed Vitals:   10/31/13 1036 10/31/13 1041  BP: 113/71 121/79  Pulse: 55 51  Resp: 22   Height: 5\' 8"  (1.727 m)   Weight: 247 lb (112.038 kg)    Body mass index is 37.56 kg/(m^2).  General: A&O x 3, WD, Obese.  Pulmonary: Sym exp, good air movt, CTAB, no rales, rhonchi, or wheezing.   Cardiac: RRR, Nl S1, S2, no Murmurs, rubs or gallops, left chest defibrillator noted.  Carotid Bruits Left Right   Negative Negative   Aorta is not palpable. Radial pulses are 2+ and =                          VASCULAR EXAM:                                                                                                         LE Pulses LEFT RIGHT       FEMORAL  faintly palpable   palpable        POPLITEAL  not palpable   not palpable       POSTERIOR TIBIAL  not palpable   not palpable        DORSALIS PEDIS      ANTERIOR TIBIAL not palpable  not palpable     Gastrointestinal: soft, NTND, -G/R, - HSM, - masses, - CVAT B, large panus.  Musculoskeletal: M/S 5/5 throughout, Extremities without ischemic changes.   Neurologic: CN 2-12 intact, Pain and light touch intact in extremities,  Motor exam as listed above, loquacious.   Non-Invasive  Vascular Imaging  AAA Duplex (10/31/2013)  Previous size: 4.9 cm (Date: 10/31/12)  Current size:  5.91 cm (Date: 10/31/2013)  Medical Decision Making  The patient is a 67 y.o. male who presents with asymptomatic AAA with increasing size, 1.0 1cm increase in 1 year.   Based on this patient's exam and diagnostic studies, and after discussing with Dr. Bridgett Larsson, the patient will follow up with Dr. Bridgett Larsson after the following studies: CT angio of abdomen/pelvis to better evaluate size and position of AAA.  The threshold for repair is AAA size > 5.5 cm, growth > 1 cm/yr, and symptomatic status.  I emphasized the importance of maximal medical management including strict control of blood pressure, blood glucose, and lipid levels, antiplatelet agents, obtaining regular exercise, and  continued cessation of smoking.   The patient was given information about AAA including signs, symptoms, treatment, and how to minimize the risk of enlargement and rupture of aneurysms.    The patient was advised to call 911 should the patient experience sudden onset abdominal or back pain.   Thank you for allowing [place] to participate in this patient's care.  Clemon Chambers, RN, MSN, FNP-C [facility] [place] Office: [phone]  Clinic Physician: Bridgett Larsson  10/31/2013, 10:50 AM

## 2013-10-31 NOTE — Patient Instructions (Addendum)
Abdominal Aortic Aneurysm An aneurysm is a weakened or damaged part of an artery wall that bulges from the normal force of blood pumping through the body. An abdominal aortic aneurysm is an aneurysm that occurs in the lower part of the aorta, the main artery of the body.  The major concern with an abdominal aortic aneurysm is that it can enlarge and burst (rupture) or blood can flow between the layers of the wall of the aorta through a tear (aorticdissection). Both of these conditions can cause bleeding inside the body and can be life threatening unless diagnosed and treated promptly. CAUSES  The exact cause of an abdominal aortic aneurysm is unknown. Some contributing factors are:   A hardening of the arteries caused by the buildup of fat and other substances in the lining of a blood vessel (arteriosclerosis).  Inflammation of the walls of an artery (arteritis).   Connective tissue diseases, such as Marfan syndrome.   Abdominal trauma.   An infection, such as syphilis or staphylococcus, in the wall of the aorta (infectious aortitis) caused by bacteria. RISK FACTORS  Risk factors that contribute to an abdominal aortic aneurysm may include:  Age older than 60 years.   High blood pressure (hypertension).  Male gender.  Ethnicity (white race).  Obesity.  Family history of aneurysm (first degree relatives only).  Tobacco use. PREVENTION  The following healthy lifestyle habits may help decrease your risk of abdominal aortic aneurysm:  Quitting smoking. Smoking can raise your blood pressure and cause arteriosclerosis.  Limiting or avoiding alcohol.  Keeping your blood pressure, blood sugar level, and cholesterol levels within normal limits.  Decreasing your salt intake. In somepeople, too much salt can raise blood pressure and increase your risk of abdominal aortic aneurysm.  Eating a diet low in saturated fats and cholesterol.  Increasing your fiber intake by including  whole grains, vegetables, and fruits in your diet. Eating these foods may help lower blood pressure.  Maintaining a healthy weight.  Staying physically active and exercising regularly. SYMPTOMS  The symptoms of abdominal aortic aneurysm may vary depending on the size and rate of growth of the aneurysm.Most grow slowly and do not have any symptoms. When symptoms do occur, they may include:  Pain (abdomen, side, lower back, or groin). The pain may vary in intensity. A sudden onset of severe pain may indicate that the aneurysm has ruptured.  Feeling full after eating only small amounts of food.  Nausea or vomiting or both.  Feeling a pulsating lump in the abdomen.  Feeling faint or passing out. DIAGNOSIS  Since most unruptured abdominal aortic aneurysms have no symptoms, they are often discovered during diagnostic exams for other conditions. An aneurysm may be found during the following procedures:  Ultrasonography (A one-time screening for abdominal aortic aneurysm by ultrasonography is also recommended for all men aged 65-75 years who have ever smoked).  X-ray exams.  A computed tomography (CT).  Magnetic resonance imaging (MRI).  Angiography or arteriography. TREATMENT  Treatment of an abdominal aortic aneurysm depends on the size of your aneurysm, your age, and risk factors for rupture. Medication to control blood pressure and pain may be used to manage aneurysms smaller than 6 cm. Regular monitoring for enlargement may be recommended by your caregiver if:  The aneurysm is 3 4 cm in size (an annual ultrasonography may be recommended).  The aneurysm is 4 4.5 cm in size (an ultrasonography every 6 months may be recommended).  The aneurysm is larger than 4.5   cm in size (your caregiver may ask that you be examined by a vascular surgeon). If your aneurysm is larger than 6 cm, surgical repair may be recommended. There are two main methods for repair of an aneurysm:   Endovascular  repair (a minimally invasive surgery). This is done most often.  Open repair. This method is used if an endovascular repair is not possible. Document Released: 09/06/2005 Document Revised: 03/24/2013 Document Reviewed: 12/27/2012 [facility] Patient Information 2014 [place], [place].   Obesity Obesity is defined as having too much total body fat and a body mass index (BMI) of 30 or more. BMI is an estimate of body fat and is calculated from your height and weight. Obesity happens when you consume more calories than you can burn by exercising or performing daily physical tasks. Prolonged obesity can cause major illnesses or emergencies, such as:   A stroke.  Heart disease.  Diabetes.  Cancer.  Arthritis.  High blood pressure (hypertension).  High cholesterol.  Sleep apnea.  Erectile dysfunction.  Infertility problems. CAUSES   Regularly eating unhealthy foods.  Physical inactivity.  Certain disorders, such as an underactive thyroid (hypothyroidism), Cushing's syndrome, and polycystic ovarian syndrome.  Certain medicines, such as steroids, some depression medicines, and antipsychotics.  Genetics.  Lack of sleep. DIAGNOSIS  A caregiver can diagnose obesity after calculating your BMI. Obesity will be diagnosed if your BMI is 30 or higher.  There are other methods of measuring obesity levels. Some other methods include measuring your skin fold thickness, your waist circumference, and comparing your hip circumference to your waist circumference. TREATMENT  A healthy treatment program includes some or all of the following:  Long-term dietary changes.  Exercise and physical activity.  Behavioral and lifestyle changes.  Medicine only under the supervision of your caregiver. Medicines may help, but only if they are used with diet and exercise programs. An unhealthy treatment program includes:  Fasting.  Fad diets.  Supplements and drugs. These choices do not succeed in  long-term weight control.  HOME CARE INSTRUCTIONS   Exercise and perform physical activity as directed by your caregiver. To increase physical activity, try the following:  Use stairs instead of elevators.  Park farther away from store entrances.  Garden, bike, or walk instead of watching television or using the computer.  Eat healthy, low-calorie foods and drinks on a regular basis. Eat more fruits and vegetables. Use low-calorie cookbooks or take healthy cooking classes.  Limit fast food, sweets, and processed snack foods.  Eat smaller portions.  Keep a daily journal of everything you eat. There are many free websites to help you with this. It may be helpful to measure your foods so you can determine if you are eating the correct portion sizes.  Avoid drinking alcohol. Drink more water and drinks without calories.  Take vitamins and supplements only as recommended by your caregiver.  Weight-loss support groups, Nurse, mental health, counselors, and stress reduction education can also be very helpful. SEEK IMMEDIATE MEDICAL CARE IF:  You have chest pain or tightness.  You have trouble breathing or feel short of breath.  You have weakness or leg numbness.  You feel confused or have trouble talking.  You have sudden changes in your vision. MAKE SURE YOU:  Understand these instructions.  Will watch your condition.  Will get help right away if you are not doing well or get worse. Document Released: 01/04/2005 Document Revised: 05/28/2012 Document Reviewed: 01/03/2012 [facility] Patient Information 2014 [place].

## 2013-11-05 ENCOUNTER — Ambulatory Visit
Admission: RE | Admit: 2013-11-05 | Discharge: 2013-11-05 | Disposition: A | Discharge status: Home / Self Care | Payer: Medicare Other | Source: Ambulatory Visit | Attending: Vascular Surgery | Admitting: Vascular Surgery

## 2013-11-05 DIAGNOSIS — I714 Abdominal aortic aneurysm, without rupture: Secondary | ICD-10-CM

## 2013-11-05 MED ORDER — IOHEXOL 350 MG/ML SOLN
125.0000 mL | Freq: Once | INTRAVENOUS | Status: AC | PRN
  Administered 2013-11-05: 125 mL via INTRAVENOUS

## 2013-11-20 ENCOUNTER — Encounter: Payer: Self-pay | Admitting: Vascular Surgery

## 2013-11-21 ENCOUNTER — Encounter: Payer: Self-pay | Admitting: Vascular Surgery

## 2013-11-21 ENCOUNTER — Ambulatory Visit (INDEPENDENT_AMBULATORY_CARE_PROVIDER_SITE_OTHER): Payer: Medicare Other | Admitting: Vascular Surgery

## 2013-11-21 VITALS — BP 114/52 | HR 56 | Ht 68.0 in | Wt 253.5 lb

## 2013-11-21 DIAGNOSIS — I714 Abdominal aortic aneurysm, without rupture: Secondary | ICD-10-CM | POA: Diagnosis not present

## 2013-11-21 DIAGNOSIS — Z01810 Encounter for preprocedural cardiovascular examination: Secondary | ICD-10-CM | POA: Diagnosis not present

## 2013-11-21 NOTE — Progress Notes (Signed)
[facility] [place]  Established Abdominal Aortic Aneurysm  History of Present Illness  The patient is a 67 y.o. (Sep 14, 1946) male who presents with chief complaint: follow up for AAA.  Previous studies demonstrate an AAA, measuring 5.0 cm.  Recent CTA demonstrates increase in size to 5.7 cm.  The patient does not have back or abdominal pain.  The patient is a former smoker.  The patient's PMH, PSH, SH, FamHx, Med, and Allergies are unchanged from 10/31/13.  On ROS today: no back pain, no leg pain  Physical Examination  Filed Vitals:   11/21/13 0848  BP: 114/52  Pulse: 56  Height: 5\' 8"  (1.727 m)  Weight: 253 lb 8 oz (114.987 kg)  SpO2: 96%   Body mass index is 38.55 kg/(m^2).  General: A&O x 3, WD, morbidly obese  Pulmonary: Sym exp, good air movt, CTAB, no rales, rhonchi, & wheezing  Cardiac: RRR, Nl S1, S2, no Murmurs, rubs or gallops  Vascular: Vessel Right Left  Radial Palpable Palpable  Brachial Palpable Palpable  Carotid Palpable, without bruit Palpable, without bruit  Aorta Not palpable [name]  Femoral Palpable Palpable  Popliteal Not palpable Not palpable  PT Faintly Palpable Faintly Palpable  DP Not Palpable Not Palpable   Gastrointestinal: soft, NTND, -G/R, - HSM, - masses, - CVAT B  Musculoskeletal: M/S 5/5 throughout , Extremities without ischemic changes   Neurologic: Pain and light touch intact in extremities , Motor exam as listed above  CTA Abd/Pelvis (11/21/2013) 1. 5.7 cm infrarenal aortic aneurysm, increased from 5 cm on 01/26/2012.  2. Patent left renal and bilateral proximal common iliac arterial stents.  Based on my review of this patient's CTA, he has a large AAA which appears compatible with EVAR.  There are two CIA stents which appear to 7 mm in diameter.  The patient has a significant amount of subcutaneous fat.  Medical Decision Making  The patient is a 67 y.o. male who presents with: asymptomatic large AAA with  increasing size.   Based on this patient's exam and diagnostic studies, he needs OAR (open aortic repair) vs EVAR.  The patient is going to track down his cardiology records as his CIA stents were placed by a Cardiologist.  I will have CTA reviewed by my [employer] rep to verify it meets criteria for the Gore C3 device.  The iliac access might be the problem for an EVAR.  As a precaution, Cardiology evaluation will be obtain in case OAR is necessary.  Once all this data is available, we will make a decision in regards to OAR vs EVAR.  I emphasized the importance of maximal medical management including strict control of blood pressure, blood glucose, and lipid levels, antiplatelet agents, obtaining regular exercise, and cessation of smoking.    Thank you for allowing [place] to participate in this patient's care.  Adele Barthel, MD [facility] [place] Office: [phone] Pager: [phone]  11/21/2013, 2:09 PM

## 2013-11-24 ENCOUNTER — Encounter: Payer: Self-pay | Admitting: Internal Medicine

## 2013-11-24 ENCOUNTER — Ambulatory Visit (INDEPENDENT_AMBULATORY_CARE_PROVIDER_SITE_OTHER): Payer: Medicare Other | Admitting: *Deleted

## 2013-11-24 ENCOUNTER — Ambulatory Visit (INDEPENDENT_AMBULATORY_CARE_PROVIDER_SITE_OTHER): Payer: Medicare Other | Admitting: Internal Medicine

## 2013-11-24 ENCOUNTER — Encounter: Payer: Self-pay | Admitting: *Deleted

## 2013-11-24 VITALS — BP 156/74 | HR 56 | Ht 68.0 in | Wt 250.8 lb

## 2013-11-24 DIAGNOSIS — I2589 Other forms of chronic ischemic heart disease: Secondary | ICD-10-CM

## 2013-11-24 DIAGNOSIS — I739 Peripheral vascular disease, unspecified: Secondary | ICD-10-CM

## 2013-11-24 DIAGNOSIS — I6523 Occlusion and stenosis of bilateral carotid arteries: Secondary | ICD-10-CM

## 2013-11-24 DIAGNOSIS — I255 Ischemic cardiomyopathy: Secondary | ICD-10-CM | POA: Insufficient documentation

## 2013-11-24 DIAGNOSIS — Z9889 Other specified postprocedural states: Secondary | ICD-10-CM

## 2013-11-24 DIAGNOSIS — J449 Chronic obstructive pulmonary disease, unspecified: Secondary | ICD-10-CM

## 2013-11-24 DIAGNOSIS — I2510 Atherosclerotic heart disease of native coronary artery without angina pectoris: Secondary | ICD-10-CM

## 2013-11-24 DIAGNOSIS — I658 Occlusion and stenosis of other precerebral arteries: Secondary | ICD-10-CM

## 2013-11-24 DIAGNOSIS — Z951 Presence of aortocoronary bypass graft: Secondary | ICD-10-CM | POA: Insufficient documentation

## 2013-11-24 DIAGNOSIS — I6529 Occlusion and stenosis of unspecified carotid artery: Secondary | ICD-10-CM | POA: Diagnosis not present

## 2013-11-24 DIAGNOSIS — I252 Old myocardial infarction: Secondary | ICD-10-CM

## 2013-11-24 DIAGNOSIS — G4733 Obstructive sleep apnea (adult) (pediatric): Secondary | ICD-10-CM

## 2013-11-24 LAB — ICD DEVICE OBSERVATION

## 2013-11-24 LAB — MDC_IDC_ENUM_SESS_TYPE_INCLINIC
Battery Remaining Longevity: 10
Brady Statistic RV Percent Paced: 1 %
HighPow Impedance: 47 Ohm
Implantable Pulse Generator Serial Number: 265569
Lead Channel Impedance Value: 532 Ohm
Lead Channel Pacing Threshold Amplitude: 2.5 V
Lead Channel Pacing Threshold Pulse Width: 1 ms
Lead Channel Sensing Intrinsic Amplitude: 9.5 mV
Lead Channel Setting Pacing Amplitude: 4.5 V
Lead Channel Setting Pacing Pulse Width: 0.4 ms
Lead Channel Setting Sensing Sensitivity: 0.5 mV
Zone Setting Detection Interval: 272.7 ms
Zone Setting Detection Interval: 315.7 ms
Zone Setting Detection Interval: 375 ms

## 2013-11-24 NOTE — Progress Notes (Signed)
OFFICE NOTE  Chief Complaint:  Pre-operative cardiovascular risk assessment for AAA surgery  Primary Care Physician: [name], MD  HPI:  [name] is a 67 year old patient previously followed by Dr. Rollene Fare. He is establishing care with me today as a new patient.  He has extensive peripheral arterial disease (with bilateral iliac, CFA and renal stents - and numerous angioplasties), coronary disease, and exogenous obesity, remote cigarette abuse (quit in 2005) and past polypectomy. He had left CEA a year ago by Dr. Bridgett Larsson that was uncomplicated for high-grade asymptomatic carotid stenosis.  He has a known AAA which apparently has enlarged in size over the past year to 5.7 cm.  He has a known asymptomatic AAA followed by Dr. Bridgett Larsson, and he has had moderate elevation of left renal velocities in the past.  He has remote asymptomatic right carotid occlusion, and his carotid Dopplers now and aneurysm are followed by Dr. Bridgett Larsson as outlined above. He has not had any chest pain, symptoms of ischemia or arrhythmia.  From a cardiac standpoint he has had CABG x2 in 1996, cath in 2008, had occlusion in both vein grafts from his initial CABG, but no significant LAD or diagonal disease, and he has had circumflex collaterals from the left. He had 70% in-stent restenosis of prior left renal artery stent, but he has had good blood pressures. His remote CABG was in 1986 - 2 SVG's to the PDA and LCX, both of which are chronically occluded. At his last cath in 2008 he basically had 40% DX2, 30% LAD beyond DX2, 100% old circumflex and 100% right with left-to-left and left-to-right collaterals.  Nuclear stress testing in 09/2012 was negative for ischemia, but showed a dense inferior scar and EF of 26%.  EF by last echo in 1/14 was approximately 30-35% with no significant valve disease. He is walking 10 miles a week, and he walks approximately 5-7 days a week. He has not had any angina. There has been no PND or  orthopnea and no claudication and no CNS events. He had ICD implanted in 2006 for purposes of primary prevention with ischemic cardiomyopathy by Dr. Rollene Fare, and he has had an EOL generator change in May of 2011. He has not had any ICD shocks recently since being started on low dose amiodarone.   He is here today for preoperative cardiovascular risk assessment prior to elective AAA repair.  We did perform interrogation of his Copy single-chamber AICD today.  This demonstrated new shocks and greater than 10 years battery life. Underlying rhythm appears regular.  PMHx:  Past Medical History  Diagnosis Date  . Myocardial infarction   . Hypertension   . CHF (congestive heart failure)   . COPD (chronic obstructive pulmonary disease)   . GERD (gastroesophageal reflux disease)   . AAA (abdominal aortic aneurysm)   . Tremor   . Sleep apnea   . Dysrhythmia     ICD-defibrillator  . Diabetes mellitus     DIET CONTROLLED  . Carotid artery stenosis   . Peripheral vascular disease   . Adenomatous colon polyp 01/2004  . Diverticulosis   . Anemia   . AVM (arteriovenous malformation)   . CAD (coronary artery disease)   . HLD (hyperlipidemia)   . Hypothyroidism   . BPH (benign prostatic hypertrophy)   . Fatigue     Past Surgical History  Procedure Laterality Date  . Coronary artery bypass graft    . Cardiac catheterization    .  Coronary angioplasty    . Renal artery stent    . Angioplasty      BILATERAL  LE  W/STENTS  . Endarterectomy  01/04/2012    Procedure: ENDARTERECTOMY CAROTID;  Surgeon: Hinda Lenis, MD;  Location: [place];  Service: Vascular;  Laterality: Left;  with patch angioplasty  . Carotid endarterectomy  01/04/12  . Cardiac defibrillator placement    . Iron infusion  June 16, 2012    FAMHx:  Family History  Problem Relation Age of Onset  . Colon cancer Sister   . Diabetes Sister   . Heart disease Sister   . Heart disease Brother   . Lung  cancer Mother   . Cancer Mother   . Heart attack Maternal Grandmother   . Colon polyps Sister     SOCHx:   reports that he quit smoking about 10 years ago. His smoking use included Cigarettes. He has a 60 pack-year smoking history. He has never used smokeless tobacco. He reports that he does not drink alcohol or use illicit drugs.  ALLERGIES:  Allergies  Allergen Reactions  . Ace Inhibitors     REACTION: cough  . Penicillins Other (See Comments)    "childhood allergy"  . Tussionex Pennkinetic Er [Hydrocod Polst-Cpm Polst Er] Other (See Comments)    "caused prostate problems"    ROS: A comprehensive review of systems was negative except for: Cardiovascular: positive for dyspnea and lower extremity edema  HOME MEDS: Current Outpatient Prescriptions  Medication Sig Dispense Refill  . amiodarone (CORDARONE) 200 MG tablet Take 200 mg by mouth daily.       Marland Kitchen aspirin 81 MG tablet Take 81 mg by mouth daily.      Marland Kitchen atorvastatin (LIPITOR) 40 MG tablet Take 1 tablet (40 mg total) by mouth daily.  90 tablet  3  . carvedilol (COREG) 25 MG tablet Take 1 tablet (25 mg total) by mouth 2 (two) times daily with a meal.  180 tablet  3  . clopidogrel (PLAVIX) 75 MG tablet Take 75 mg by mouth daily.        . finasteride (PROSCAR) 5 MG tablet Take 5 mg by mouth daily.      . fish oil-omega-3 fatty acids 1000 MG capsule Take 1 g by mouth daily.       . furosemide (LASIX) 80 MG tablet Take 1 tablet (80 mg total) by mouth 2 (two) times daily.  180 tablet  3  . isosorbide mononitrate (IMDUR) 60 MG 24 hr tablet Take 1 tablet every am and 0.5 tablet at bedtime  135 tablet  3  . levothyroxine (SYNTHROID, LEVOTHROID) 50 MCG tablet TAKE 1 TABLET (50 MCG TOTAL) BY MOUTH DAILY.  90 tablet  1  . metolazone (ZAROXOLYN) 2.5 MG tablet continuous as needed.      . Multiple Vitamins-Minerals (MENS 50+ MULTI VITAMIN/MIN) TABS Take by mouth daily.      . multivitamin-lutein (OCUVITE-LUTEIN) CAPS Take 1 capsule by mouth  daily.        . nitroGLYCERIN (NITROSTAT) 0.4 MG SL tablet Place 1 tablet (0.4 mg total) under the tongue every 5 (five) minutes as needed.  25 tablet  3  . ramipril (ALTACE) 2.5 MG capsule Take 2.5-5 mg by mouth. Take 2 capsules (5mg ) in AM. Take 1 capsule (2.5mg ) in PM      . spironolactone (ALDACTONE) 25 MG tablet Take 1 tablet (25 mg total) by mouth daily.  90 tablet  3  . tiotropium (SPIRIVA HANDIHALER)  18 MCG inhalation capsule Place 1 capsule (18 mcg total) into inhaler and inhale daily.  90 capsule  3   No current facility-administered medications for this visit.    LABS/IMAGING: No results found for this or any previous visit (from the past 48 hour(s)). No results found.  VITALS: BP 156/74  Pulse 56  Ht 5\' 8"  (1.727 m)  Wt 250 lb 12.8 oz (113.762 kg)  BMI 38.14 kg/m2  EXAM: General appearance: alert and no distress Neck: no carotid bruit and no JVD Lungs: clear to auscultation bilaterally Heart: regular rate and rhythm Abdomen: soft, non-tender; bowel sounds normal; no masses,  no organomegaly Extremities: edema trace bilateral Pulses: 2+ and symmetric Skin: Skin color, texture, turgor normal. No rashes or lesions Neurologic: Grossly normal Psych: Mood, affect normal  EKG: deferred  ASSESSMENT: 1. Significant PAD (detailed history provided in HPI) with multiple interventions 2. AAA - now requiring repair due to expansion 3. CAD  - s/p 2 vessel CABG in 1996 4. S/p AICD - single chamber [employer] 5. Ischemic cardiomyopathy - EF 30-35% by echo in 12/2012 6. Myoview in 09/2012 - fixed inferior scar, no ischemia 7. Intermediate to high risk for a high risk vascular procedure  PLAN: 1.   Mr. Doorn has an expanding abdominal aortic aneurysm which now requires surgery. I have reviewed his vascular anatomy and it appears that he has stents which probably spanned from the popliteal arteries bilaterally up to the iliac arteries in both legs. He also has bilateral renal  artery stents.  He had a stress test about one year ago which was negative for ischemia. He does have a depressed EF, but is not having active anginal symptoms. He's had no ICD firings. Overall I would categorize him as intermediate to high risk for a high risk vascular procedure. No further testing is necessary at this time. His risk of adverse cardiac events such as arrhythmia, stroke or MI may be as high as 10-15%. He understands this risk and is willing to proceed.  Plan to see him back in followup after his aneurysm repair. I'm available as needed perioperatively if issues arise - please contact me.  Pixie Casino, MD, [facility] Attending Cardiologist [facility]  HILTY,Kenneth C 11/24/2013, 3:06 PM

## 2013-11-24 NOTE — Patient Instructions (Signed)
Your physician recommends that you schedule a follow-up appointment in: 3 months with Dr. Hilty.  

## 2013-11-26 ENCOUNTER — Encounter: Payer: Self-pay | Admitting: Internal Medicine

## 2013-11-30 DIAGNOSIS — Z23 Encounter for immunization: Secondary | ICD-10-CM | POA: Diagnosis not present

## 2013-12-01 NOTE — Progress Notes (Signed)
ICD check in clinic. Normal device function. Threshold and sensing consistent with previous device measurements. Impedance trends stable over time. No evidence of any ventricular arrhythmias. Histogram distribution appropriate for patient and level of activity. No changes made this session. Device programmed at appropriate safety margins. Device programmed to optimize intrinsic conduction. Estimated longevity 10 years.  Plan to follow up with [facility] in 6 months.

## 2013-12-09 ENCOUNTER — Other Ambulatory Visit: Payer: Self-pay | Admitting: *Deleted

## 2013-12-15 ENCOUNTER — Telehealth: Payer: Self-pay

## 2013-12-15 NOTE — Telephone Encounter (Signed)
Message copied by Denman George on Mon Dec 15, 2013  4:20 PM ------      Message from: Pixie Casino      Created: Mon Dec 15, 2013  1:46 PM      Regarding: RE: APPROVAL TO HOLD PLAVIX PRE-OP       Ok to hold plavix for 1 week prior to surgery.            Dr. Debara Pickett            ----- Message -----         From: Lynetta Mare Leydi Winstead, RN         Sent: 12/15/2013  12:21 PM           To: Pixie Casino, MD      Subject: APPROVAL TO HOLD PLAVIX PRE-OP                           Please advise on this pt's Plavix; scheduled for Endovascular Stent Graft Repair of AAA 12/23/13; we would advise him to hold his Plavix x1 week, pre-operatively, unless he had a drug-eluting coronary stent.               ------

## 2013-12-16 ENCOUNTER — Encounter (HOSPITAL_COMMUNITY): Payer: Self-pay | Admitting: Pharmacy Technician

## 2013-12-18 NOTE — Pre-Procedure Instructions (Addendum)
[name]  12/18/2013   Your procedure is scheduled on: Tuesday, Januray 13.  Report to [facility], Main Entrance / Entrance "A" at 5:30 AM.  Call this number if you have problems the morning of surgery: [phone]   Remember:   Do not eat food or drink liquids after midnight.   Take these medicines the morning of surgery with A SIP OF WATER: Amiodarone, Carvedilol, Isosorbide Mononitrate,levothyroxine            STOP all herbel meds, nsaids (aleve,naproxen,advil,ibuprofen) now including vitamins, fish oil        plavix per dr.                   Karenann Cai Spriva Inhaler.  Take if needed: Nitroglycerin.   Do not wear jewelry, make-up or nail polish.  Do not wear lotions, powders, or perfumes. You may wear deodorant.  Men may shave face and neck.  Do not bring valuables to the hospital.  [facility] is not responsible  for any belongings or valuables.               Contacts, dentures or bridgework may not be worn into surgery.  Leave suitcase in the car. After surgery it may be brought to your room.  For patients admitted to the hospital, discharge time is determined by your treatment team.              Special Instructions: Shower using CHG 2 nights before surgery and the night before surgery.  If you shower the day of surgery use CHG.  Use special wash - you have one bottle of CHG for all showers.  You should use approximately 1/3 of the bottle for each shower.   Please read over the following fact sheets that you were given: Pain Booklet, Coughing and Deep Breathing, Blood Transfusion Information and Surgical Site Infection Prevention

## 2013-12-19 ENCOUNTER — Ambulatory Visit (HOSPITAL_COMMUNITY)
Admission: RE | Admit: 2013-12-19 | Discharge: 2013-12-19 | Disposition: A | Discharge status: Home / Self Care | Payer: Medicare Other | Source: Ambulatory Visit | Attending: Anesthesiology | Admitting: Anesthesiology

## 2013-12-19 ENCOUNTER — Encounter (HOSPITAL_COMMUNITY): Payer: Self-pay

## 2013-12-19 ENCOUNTER — Encounter (HOSPITAL_COMMUNITY)
Admission: RE | Admit: 2013-12-19 | Discharge: 2013-12-19 | Disposition: A | Discharge status: Home / Self Care | Payer: Medicare Other | Source: Ambulatory Visit | Attending: Vascular Surgery | Admitting: Vascular Surgery

## 2013-12-19 DIAGNOSIS — R05 Cough: Secondary | ICD-10-CM | POA: Diagnosis not present

## 2013-12-19 DIAGNOSIS — I714 Abdominal aortic aneurysm, without rupture, unspecified: Secondary | ICD-10-CM | POA: Diagnosis not present

## 2013-12-19 DIAGNOSIS — I2510 Atherosclerotic heart disease of native coronary artery without angina pectoris: Secondary | ICD-10-CM | POA: Insufficient documentation

## 2013-12-19 DIAGNOSIS — Z95810 Presence of automatic (implantable) cardiac defibrillator: Secondary | ICD-10-CM | POA: Diagnosis not present

## 2013-12-19 DIAGNOSIS — J449 Chronic obstructive pulmonary disease, unspecified: Secondary | ICD-10-CM | POA: Diagnosis not present

## 2013-12-19 DIAGNOSIS — G4733 Obstructive sleep apnea (adult) (pediatric): Secondary | ICD-10-CM | POA: Diagnosis not present

## 2013-12-19 DIAGNOSIS — E039 Hypothyroidism, unspecified: Secondary | ICD-10-CM | POA: Insufficient documentation

## 2013-12-19 DIAGNOSIS — J4489 Other specified chronic obstructive pulmonary disease: Secondary | ICD-10-CM | POA: Insufficient documentation

## 2013-12-19 DIAGNOSIS — Z01812 Encounter for preprocedural laboratory examination: Secondary | ICD-10-CM | POA: Diagnosis not present

## 2013-12-19 DIAGNOSIS — E119 Type 2 diabetes mellitus without complications: Secondary | ICD-10-CM | POA: Insufficient documentation

## 2013-12-19 DIAGNOSIS — R059 Cough, unspecified: Secondary | ICD-10-CM | POA: Diagnosis not present

## 2013-12-19 DIAGNOSIS — Z951 Presence of aortocoronary bypass graft: Secondary | ICD-10-CM | POA: Diagnosis not present

## 2013-12-19 DIAGNOSIS — K219 Gastro-esophageal reflux disease without esophagitis: Secondary | ICD-10-CM | POA: Diagnosis not present

## 2013-12-19 DIAGNOSIS — E785 Hyperlipidemia, unspecified: Secondary | ICD-10-CM | POA: Insufficient documentation

## 2013-12-19 DIAGNOSIS — Z01818 Encounter for other preprocedural examination: Secondary | ICD-10-CM | POA: Insufficient documentation

## 2013-12-19 DIAGNOSIS — Z7902 Long term (current) use of antithrombotics/antiplatelets: Secondary | ICD-10-CM | POA: Diagnosis not present

## 2013-12-19 HISTORY — DX: Personal history of other diseases of the digestive system: Z87.19

## 2013-12-19 HISTORY — DX: Other complications of anesthesia, initial encounter: T88.59XA

## 2013-12-19 HISTORY — DX: Presence of automatic (implantable) cardiac defibrillator: Z95.810

## 2013-12-19 HISTORY — DX: Adverse effect of unspecified anesthetic, initial encounter: T41.45XA

## 2013-12-19 LAB — APTT: aPTT: 30 s (ref 24–37)

## 2013-12-19 LAB — COMPREHENSIVE METABOLIC PANEL WITH GFR
ALT: 15 U/L (ref 0–53)
AST: 13 U/L (ref 0–37)
Albumin: 3.4 g/dL — ABNORMAL LOW (ref 3.5–5.2)
Alkaline Phosphatase: 70 U/L (ref 39–117)
BUN: 23 mg/dL (ref 6–23)
CO2: 22 meq/L (ref 19–32)
Calcium: 8.7 mg/dL (ref 8.4–10.5)
Chloride: 102 meq/L (ref 96–112)
Creatinine, Ser: 0.8 mg/dL (ref 0.50–1.35)
GFR calc Af Amer: >90 mL/min (ref >90)
GFR calc non Af Amer: 90 mL/min — ABNORMAL LOW (ref >90)
Glucose, Bld: 116 mg/dL — ABNORMAL HIGH (ref 70–99)
Potassium: 4.6 meq/L (ref 3.7–5.3)
Sodium: 138 meq/L (ref 137–147)
Total Bilirubin: 0.6 mg/dL (ref 0.3–1.2)
Total Protein: 6.8 g/dL (ref 6.0–8.3)

## 2013-12-19 LAB — BLOOD GAS, ARTERIAL
Acid-Base Excess: 1.6 mmol/L (ref 0.0–2.0)
Bicarbonate: 25.6 mEq/L — ABNORMAL HIGH (ref 20.0–24.0)
Drawn by: 344381
FIO2: 0.21 %
O2 Saturation: 95 %
Patient temperature: 98.6
TCO2: 26.9 mmol/L (ref 0–100)
pCO2 arterial: 40.4 mmHg (ref 35.0–45.0)
pH, Arterial: 7.419 (ref 7.350–7.450)
pO2, Arterial: 76.8 mmHg — ABNORMAL LOW (ref 80.0–100.0)

## 2013-12-19 LAB — CBC
HCT: 39.6 % (ref 39.0–52.0)
Hemoglobin: 12.9 g/dL — ABNORMAL LOW (ref 13.0–17.0)
MCH: 31 pg (ref 26.0–34.0)
MCHC: 32.6 g/dL (ref 30.0–36.0)
MCV: 95.2 fL (ref 78.0–100.0)
Platelets: 139 K/uL — ABNORMAL LOW (ref 150–400)
RBC: 4.16 MIL/uL — ABNORMAL LOW (ref 4.22–5.81)
RDW: 13.6 % (ref 11.5–15.5)
WBC: 11 K/uL — ABNORMAL HIGH (ref 4.0–10.5)

## 2013-12-19 LAB — URINALYSIS, ROUTINE W REFLEX MICROSCOPIC
Bilirubin Urine: NEGATIVE
Glucose, UA: NEGATIVE mg/dL
Hgb urine dipstick: NEGATIVE
Ketones, ur: NEGATIVE mg/dL
Leukocytes, UA: NEGATIVE
Nitrite: NEGATIVE
Protein, ur: NEGATIVE mg/dL
Specific Gravity, Urine: 1.017 (ref 1.005–1.030)
Urobilinogen, UA: 1 mg/dL (ref 0.0–1.0)
pH: 5.5 (ref 5.0–8.0)

## 2013-12-19 LAB — SURGICAL PCR SCREEN
MRSA, PCR: NEGATIVE
Staphylococcus aureus: NEGATIVE

## 2013-12-19 LAB — PROTIME-INR
INR: 1.05 (ref 0.00–1.49)
Prothrombin Time: 13.5 seconds (ref 11.6–15.2)

## 2013-12-20 ENCOUNTER — Encounter: Payer: Self-pay | Admitting: Adult Health

## 2013-12-20 ENCOUNTER — Ambulatory Visit (INDEPENDENT_AMBULATORY_CARE_PROVIDER_SITE_OTHER): Payer: Medicare Other | Admitting: Adult Health

## 2013-12-20 VITALS — BP 120/80 | HR 66 | Temp 98.8°F | Resp 16 | Ht 68.0 in | Wt 258.0 lb

## 2013-12-20 DIAGNOSIS — J209 Acute bronchitis, unspecified: Secondary | ICD-10-CM | POA: Diagnosis not present

## 2013-12-20 DIAGNOSIS — J988 Other specified respiratory disorders: Secondary | ICD-10-CM | POA: Insufficient documentation

## 2013-12-20 DIAGNOSIS — J069 Acute upper respiratory infection, unspecified: Secondary | ICD-10-CM | POA: Diagnosis not present

## 2013-12-20 MED ORDER — CEFUROXIME AXETIL 500 MG PO TABS: 500.0000 mg | ORAL_TABLET | Freq: Two times a day (BID) | ORAL | Status: AC

## 2013-12-20 MED ORDER — CEFUROXIME AXETIL 500 MG PO TABS: 500.0000 mg | ORAL_TABLET | Freq: Two times a day (BID) | ORAL | Status: DC

## 2013-12-20 MED ORDER — ALBUTEROL SULFATE HFA 108 (90 BASE) MCG/ACT IN AERS: 2.0000 | INHALATION_SPRAY | Freq: Four times a day (QID) | RESPIRATORY_TRACT | Status: DC | PRN

## 2013-12-20 MED ORDER — BENZONATATE 200 MG PO CAPS: 200.0000 mg | ORAL_CAPSULE | Freq: Two times a day (BID) | ORAL | Status: DC | PRN

## 2013-12-20 NOTE — Progress Notes (Signed)
Pre-visit discussion using our clinic review tool. No additional management support is needed unless otherwise documented below in the visit note.  

## 2013-12-20 NOTE — Patient Instructions (Addendum)
  Start Ceftin 500 mg twice a day for 10 days.  Recommend you take a probiotic such as Align or Culturelle while on antibiotic  Albuterol 2 puffs into the lungs every 6 hours as needed for wheezing, chest tightness, shortness of breath.  Tessalon for cough - take 1 capsule every 12 hours as needed for cough

## 2013-12-20 NOTE — Progress Notes (Signed)
   Subjective:    Patient ID: [name], male    DOB: [birth year], 68 y.o.   MRN: [id number]  HPI  Pt is a 68 y/o male with hx of COPD who presents with cough, chest congestion, thick colored mucus x 3 days. No fever at present.   Current Outpatient Prescriptions on File Prior to Visit  Medication Sig Dispense Refill  . amiodarone (CORDARONE) 200 MG tablet Take 200 mg by mouth daily.       Marland Kitchen aspirin 81 MG tablet Take 81 mg by mouth daily.      Marland Kitchen atorvastatin (LIPITOR) 40 MG tablet Take 1 tablet (40 mg total) by mouth daily.  90 tablet  3  . carvedilol (COREG) 25 MG tablet Take 1 tablet (25 mg total) by mouth 2 (two) times daily with a meal.  180 tablet  3  . cholecalciferol (VITAMIN D) 1000 UNITS tablet Take 1,000 Units by mouth daily.      . clopidogrel (PLAVIX) 75 MG tablet Take 75 mg by mouth daily.        . finasteride (PROSCAR) 5 MG tablet Take 5 mg by mouth daily.      . fish oil-omega-3 fatty acids 1000 MG capsule Take 1 g by mouth daily.       . furosemide (LASIX) 80 MG tablet Take 1 tablet (80 mg total) by mouth 2 (two) times daily.  180 tablet  3  . isosorbide mononitrate (IMDUR) 60 MG 24 hr tablet Take 30-60 mg by mouth 2 (two) times daily. Take 1 tablet every am and 0.5 tablet at bedtime      . levothyroxine (SYNTHROID, LEVOTHROID) 50 MCG tablet TAKE 1 TABLET (50 MCG TOTAL) BY MOUTH DAILY.  90 tablet  1  . metolazone (ZAROXOLYN) 2.5 MG tablet Take 2.5 mg by mouth daily as needed (for swelling).       . Multiple Vitamins-Minerals (MENS 50+ MULTI VITAMIN/MIN) TABS Take by mouth daily.      . multivitamin-lutein (OCUVITE-LUTEIN) CAPS Take 1 capsule by mouth daily.        . nitroGLYCERIN (NITROSTAT) 0.4 MG SL tablet Place 1 tablet (0.4 mg total) under the tongue every 5 (five) minutes as needed.  25 tablet  3  . ramipril (ALTACE) 2.5 MG capsule Take 2.5-5 mg by mouth 2 (two) times daily. Take 2 capsules (5mg ) in AM. Take 1 capsule (2.5mg ) in PM      . spironolactone (ALDACTONE)  25 MG tablet Take 1 tablet (25 mg total) by mouth daily.  90 tablet  3  . tiotropium (SPIRIVA HANDIHALER) 18 MCG inhalation capsule Place 1 capsule (18 mcg total) into inhaler and inhale daily.  90 capsule  3   No current facility-administered medications on file prior to visit.      Review of Systems  Respiratory: Positive for cough, chest tightness, shortness of breath and wheezing.        Objective:   Physical Exam  Constitutional:  Pleasant 68 y/o male  Cardiovascular: Normal rate and regular rhythm.   Pulmonary/Chest: No respiratory distress. He has no wheezes. He has no rales.  Neurological: He is alert.  Psychiatric: He has a normal mood and affect. His behavior is normal. Judgment normal.          Assessment & Plan:

## 2013-12-20 NOTE — Assessment & Plan Note (Signed)
Hx of COPD. Start Ceftin bid x 10 days. Tessalon for cough. Albuterol 2 puffs every 6 hours prn for wheezing, sob, chest tightness. RTC if no improvement within 4-5 days or sooner if necessary.

## 2013-12-22 NOTE — Progress Notes (Signed)
Anesthesia Chart Review:  Patient is a 68 year old male scheduled for EVAR AAA (5.7 cm) on 12/23/13 by Dr. Bridgett Larsson.  Notes in Epic indicate that he was started on Ceftin for URI on Saturday 12/20/13. I called and spoke with patient's wife who states Dr. Bridgett Larsson is already aware and plans to postpone surgery until 01/06/14 to ensure URI has cleared.  History includes obesity, AAA, PAD with history of bilateral iliac and CFA stents, renal artery stenosis s/p bilateral renal artery stents, carotid occlusive disease s/p left CEA 12/2011 with known right ICA occlusion, former smoker, COPD, CAD/MI s/p CABG X 2 (SVG to OM2, PDA) '86 s/p PCI to CX and PDA '99 with cath in 2008 showing occlusion of both vein grafts with collateral circulation to the CX, CHF, s/p [employer] single -chamber AICD '06, OSA, GERD, hiatal hernia, anemia, diet controlled DM2, HLD, diverticulosis, tremor, AVM, hypothyroidism.  For anesthesia history, he reports claustrophobia, and back pain if he has to lie on his back for > 4 hours.  His PCP is listed as Dr. [name].  Pulmonologist is Dr. Baird Lyons. His cardiologist is now Dr. Debara Pickett (previously Dr. Rollene Fare). He saw his to get established and for pre-operative risk assessment on 11/24/13.  Patient was thought to be intermediate to high risk. Permission was given to hold Plavix X 1 week pre-operatively.  EKG on 08/27/13 showed SB, LAD, left BBB.  Echo on 01/02/13 showed: - Left ventricle: The cavity size was moderately to severely dilated. There was moderate concentric hypertrophy. Systolic function was moderately to severely reduced. The estimated ejection fraction was in the range of 30% to 35%. Severe hypokinesis of the inferolateral myocardium. Moderate hypokinesis of the anteroseptal myocardium. Doppler parameters are consistent with abnormal left ventricular relaxation (grade 1 diastolic dysfunction). - Mitral valve: Moderate regurgitation. Valve area by pressure  half-time: 2.06cm^2. - Left atrium: The atrium was moderately to severely dilated. - Right ventricle: The cavity size was moderately dilated. - Right atrium: The atrium was mildly dilated. - Pulmonary arteries: PA peak pressure: 73mm Hg (S). Impressions: IVCD with LBBB AND MILD-MODERATE INTRA-VENTRICULAR DYSSYNCHRONY. The right ventricular systolic pressure was increased consistent with mild pulmonary hypertension.  Nuclear stress test on 09/26/12 showed moderate to severe perfusion defect due to infarct/scar with mild peri-infarct ischemia seen in the basal inferior, midinferior, apical inferior, basal inferolateral and mid inferolateral regions. The post stress left ventricle is mildly dilated in size. The post stress ejection fraction is 21%. Global left ventricular systolic function is severely reduced. Compared to the previous study there is no significant change. This is considered a low risk scan.  Cardiac cath on 09/16/07 showed: 1. Left main normal.  2. LAD: The LAD had a 40% stenosis at the origin of the second moderate-sized diagonal branch, unchanged from the prior angiogram. There was a 30% segmental proximal stenosis after the second diagonal branch.  3. Left circumflex stent: Totally occluded proximally in the AV groove.  4. Right coronary artery: Occluded ostially.  5. Vein graft to the PDA and circumflex: Occluded at the aorta. 6. The overall LVEF was estimated visually at approximately 40% with severe inferobasal hypokinesia, and mild anteroapical hypokinesia. His anatomy was felt unchanged from his previous cath nearly four years prior, so continued medical therapy was recommended at that time.  CXR on 12/19/13 showed:   Bilateral interstitial thickening which is likely chronic, but is more prominent than on the prior examination suggesting superimposed mild edema versus bronchitis.  PFT:  07/16/2012-moderate to severe obstructive airways disease with air trapping, diffusion  mildly reduced. FEV1/FVC 0.49. Insignificant response to bronchodilator. RV 128%, DLCO 69%.   Preoperative labs noted.  He has been cleared with increased risk by his cardiologist. Per patient's wife, plan for surgery to be postponed due to URI.  Once treatment completed would anticipate that he could proceed as planned.  George Hugh [facility] Short Stay Center/Anesthesiology Phone [phone] 12/22/2013 11:49 AM

## 2013-12-23 ENCOUNTER — Encounter: Payer: Self-pay | Admitting: *Deleted

## 2013-12-30 ENCOUNTER — Other Ambulatory Visit: Payer: Self-pay

## 2013-12-31 NOTE — Pre-Procedure Instructions (Signed)
LERONE ONDER  12/31/2013   Your procedure is scheduled on:  Tues, Jan 27 @ 7:30 AM  Report to [facility] Short Stay Entrance A  at 5:30 AM.  Call this number if you have problems the morning of surgery: [phone]   Remember:   Do not eat food or drink liquids after midnight.   Take these medicines the morning of surgery with A SIP OF WATER: Albuterol<Bring Your Inhaler With You>,Amiodarone(Cordarone),Carvedilol(Coreg),Proscar(Finasteride),Isosorbide(Imdur),Spiriva(Tiotropium),and Synthroid(Levothyroxine),and Aspirin             Stop taking your Fish Oil.. No Goody's,BC's,Aleve,Ibuprofen,or any Herbal Medications   Do not wear jewelry  Do not wear lotions, powders, or colognes. You may wear deodorant.  Men may shave face and neck.  Do not bring valuables to the hospital.  [facility] is not responsible                  for any belongings or valuables.               Contacts, dentures or bridgework may not be worn into surgery.  Leave suitcase in the car. After surgery it may be brought to your room.  For patients admitted to the hospital, discharge time is determined by your                treatment team.                Special Instructions: Shower using CHG 2 nights before surgery and the night before surgery.  If you shower the day of surgery use CHG.  Use special wash - you have one bottle of CHG for all showers.  You should use approximately 1/3 of the bottle for each shower.   Please read over the following fact sheets that you were given: Pain Booklet, Coughing and Deep Breathing, Blood Transfusion Information, MRSA Information and Surgical Site Infection Prevention

## 2013-12-31 NOTE — Progress Notes (Signed)
Spoke with [employer] rep about pt upcoming surgery on 01/06/14 with OR start time of 0730

## 2014-01-01 ENCOUNTER — Encounter (HOSPITAL_COMMUNITY)
Admission: RE | Admit: 2014-01-01 | Discharge: 2014-01-01 | Disposition: A | Discharge status: Home / Self Care | Payer: Medicare Other | Source: Ambulatory Visit | Attending: Vascular Surgery | Admitting: Vascular Surgery

## 2014-01-01 ENCOUNTER — Encounter (HOSPITAL_COMMUNITY)
Admission: RE | Admit: 2014-01-01 | Discharge: 2014-01-01 | Disposition: A | Discharge status: Home / Self Care | Payer: Medicare Other | Source: Ambulatory Visit | Attending: Anesthesiology | Admitting: Anesthesiology

## 2014-01-01 ENCOUNTER — Encounter (HOSPITAL_COMMUNITY): Payer: Self-pay

## 2014-01-01 DIAGNOSIS — Z01812 Encounter for preprocedural laboratory examination: Secondary | ICD-10-CM | POA: Diagnosis not present

## 2014-01-01 DIAGNOSIS — Z01811 Encounter for preprocedural respiratory examination: Secondary | ICD-10-CM | POA: Insufficient documentation

## 2014-01-01 DIAGNOSIS — Z01818 Encounter for other preprocedural examination: Secondary | ICD-10-CM | POA: Insufficient documentation

## 2014-01-01 LAB — COMPREHENSIVE METABOLIC PANEL
ALT: 27 U/L (ref 0–53)
AST: 26 U/L (ref 0–37)
Albumin: 3.5 g/dL (ref 3.5–5.2)
Alkaline Phosphatase: 68 U/L (ref 39–117)
BUN: 25 mg/dL — ABNORMAL HIGH (ref 6–23)
CO2: 22 mEq/L (ref 19–32)
Calcium: 8.8 mg/dL (ref 8.4–10.5)
Chloride: 103 mEq/L (ref 96–112)
Creatinine, Ser: 0.77 mg/dL (ref 0.50–1.35)
GFR calc Af Amer: >90 mL/min (ref >90)
GFR calc non Af Amer: >90 mL/min (ref >90)
Glucose, Bld: 136 mg/dL — ABNORMAL HIGH (ref 70–99)
Potassium: 4.7 mEq/L (ref 3.7–5.3)
Sodium: 139 mEq/L (ref 137–147)
Total Bilirubin: 0.4 mg/dL (ref 0.3–1.2)
Total Protein: 6.9 g/dL (ref 6.0–8.3)

## 2014-01-01 LAB — TYPE AND SCREEN
ABO/RH(D): A POS
Antibody Screen: POSITIVE
DAT, IgG: NEGATIVE
Donor AG Type: NEGATIVE
Donor AG Type: NEGATIVE
Unit division: 0
Unit division: 0

## 2014-01-01 LAB — CBC
HCT: 41.2 % (ref 39.0–52.0)
Hemoglobin: 13.3 g/dL (ref 13.0–17.0)
MCH: 30.2 pg (ref 26.0–34.0)
MCHC: 32.3 g/dL (ref 30.0–36.0)
MCV: 93.6 fL (ref 78.0–100.0)
Platelets: 142 10*3/uL — ABNORMAL LOW (ref 150–400)
RBC: 4.4 MIL/uL (ref 4.22–5.81)
RDW: 13.6 % (ref 11.5–15.5)
WBC: 8.6 10*3/uL (ref 4.0–10.5)

## 2014-01-01 LAB — URINALYSIS, ROUTINE W REFLEX MICROSCOPIC
Bilirubin Urine: NEGATIVE
Glucose, UA: NEGATIVE mg/dL
Hgb urine dipstick: NEGATIVE
Ketones, ur: NEGATIVE mg/dL
Leukocytes, UA: NEGATIVE
Nitrite: NEGATIVE
Protein, ur: NEGATIVE mg/dL
Specific Gravity, Urine: 1.019 (ref 1.005–1.030)
Urobilinogen, UA: 0.2 mg/dL (ref 0.0–1.0)
pH: 5.5 (ref 5.0–8.0)

## 2014-01-01 LAB — BLOOD GAS, ARTERIAL
Acid-Base Excess: 2.4 mmol/L — ABNORMAL HIGH (ref 0.0–2.0)
Bicarbonate: 26.4 mEq/L — ABNORMAL HIGH (ref 20.0–24.0)
Drawn by: 206361
O2 Saturation: 95.4 %
Patient temperature: 98.6
TCO2: 27.7 mmol/L (ref 0–100)
pCO2 arterial: 41.1 mmHg (ref 35.0–45.0)
pH, Arterial: 7.424 (ref 7.350–7.450)
pO2, Arterial: 80 mmHg (ref 80.0–100.0)

## 2014-01-01 LAB — PROTIME-INR
INR: 1.07 (ref 0.00–1.49)
Prothrombin Time: 13.7 seconds (ref 11.6–15.2)

## 2014-01-01 LAB — APTT: aPTT: 28 seconds (ref 24–37)

## 2014-01-02 NOTE — Progress Notes (Signed)
Anesthesia follow-up:  See my note from 12/22/13.  Surgery was rescheduled for 01/06/14 due to URI.  CXR and labs repeated yesterday.    CXR 01/01/14 showed: 1. There is mild stable prominence of the pulmonary vascularity and pulmonary interstitium suggesting low-grade compensated CHF. The cardiac silhouette is enlarged but stable.  2. There is no alveolar pneumonia nor evidence of a pleural effusion.  Preoperative labs noted.    I called patient he feels back at baseline.  He denies any residual URI symptoms, new SOB, or new cardiac symptoms.  Anticipate that he can proceed as planned.  George Hugh [facility] Short Stay Center/Anesthesiology Phone [phone] 01/02/2014 12:57 PM

## 2014-01-05 MED ORDER — VANCOMYCIN HCL 10 G IV SOLR
1500.0000 mg | INTRAVENOUS | Status: AC
  Administered 2014-01-06: 1500 mg via INTRAVENOUS
  Filled 2014-01-05: qty 1500

## 2014-01-06 ENCOUNTER — Encounter (HOSPITAL_COMMUNITY): Payer: Medicare Other | Admitting: Vascular Surgery

## 2014-01-06 ENCOUNTER — Inpatient Hospital Stay (HOSPITAL_COMMUNITY): Payer: Medicare Other | Admitting: Certified Registered Nurse Anesthetist

## 2014-01-06 ENCOUNTER — Inpatient Hospital Stay (HOSPITAL_COMMUNITY): Payer: Medicare Other

## 2014-01-06 ENCOUNTER — Inpatient Hospital Stay (HOSPITAL_COMMUNITY)
Admission: RE | Admit: 2014-01-06 | Discharge: 2014-01-07 | DRG: 238 | Disposition: A | Discharge status: Home / Self Care | Payer: Medicare Other | Source: Ambulatory Visit | Attending: Vascular Surgery | Admitting: Vascular Surgery

## 2014-01-06 ENCOUNTER — Encounter (HOSPITAL_COMMUNITY)
Admission: RE | Disposition: A | Discharge status: Home / Self Care | Payer: Self-pay | Source: Ambulatory Visit | Attending: Vascular Surgery

## 2014-01-06 ENCOUNTER — Encounter (HOSPITAL_COMMUNITY): Payer: Self-pay | Admitting: *Deleted

## 2014-01-06 DIAGNOSIS — Z95810 Presence of automatic (implantable) cardiac defibrillator: Secondary | ICD-10-CM

## 2014-01-06 DIAGNOSIS — I255 Ischemic cardiomyopathy: Secondary | ICD-10-CM

## 2014-01-06 DIAGNOSIS — Z7982 Long term (current) use of aspirin: Secondary | ICD-10-CM | POA: Diagnosis not present

## 2014-01-06 DIAGNOSIS — I714 Abdominal aortic aneurysm, without rupture, unspecified: Principal | ICD-10-CM | POA: Diagnosis present

## 2014-01-06 DIAGNOSIS — I739 Peripheral vascular disease, unspecified: Secondary | ICD-10-CM | POA: Diagnosis present

## 2014-01-06 DIAGNOSIS — R001 Bradycardia, unspecified: Secondary | ICD-10-CM

## 2014-01-06 DIAGNOSIS — Z87891 Personal history of nicotine dependence: Secondary | ICD-10-CM

## 2014-01-06 DIAGNOSIS — I2510 Atherosclerotic heart disease of native coronary artery without angina pectoris: Secondary | ICD-10-CM | POA: Diagnosis present

## 2014-01-06 DIAGNOSIS — I2589 Other forms of chronic ischemic heart disease: Secondary | ICD-10-CM | POA: Diagnosis present

## 2014-01-06 DIAGNOSIS — Z9861 Coronary angioplasty status: Secondary | ICD-10-CM

## 2014-01-06 DIAGNOSIS — G4730 Sleep apnea, unspecified: Secondary | ICD-10-CM | POA: Diagnosis present

## 2014-01-06 DIAGNOSIS — Z7902 Long term (current) use of antithrombotics/antiplatelets: Secondary | ICD-10-CM

## 2014-01-06 DIAGNOSIS — I498 Other specified cardiac arrhythmias: Secondary | ICD-10-CM | POA: Diagnosis not present

## 2014-01-06 DIAGNOSIS — I10 Essential (primary) hypertension: Secondary | ICD-10-CM | POA: Diagnosis not present

## 2014-01-06 DIAGNOSIS — E039 Hypothyroidism, unspecified: Secondary | ICD-10-CM | POA: Diagnosis present

## 2014-01-06 DIAGNOSIS — J449 Chronic obstructive pulmonary disease, unspecified: Secondary | ICD-10-CM | POA: Diagnosis present

## 2014-01-06 DIAGNOSIS — J4489 Other specified chronic obstructive pulmonary disease: Secondary | ICD-10-CM | POA: Diagnosis present

## 2014-01-06 DIAGNOSIS — I509 Heart failure, unspecified: Secondary | ICD-10-CM | POA: Diagnosis present

## 2014-01-06 DIAGNOSIS — N400 Enlarged prostate without lower urinary tract symptoms: Secondary | ICD-10-CM | POA: Diagnosis present

## 2014-01-06 DIAGNOSIS — I252 Old myocardial infarction: Secondary | ICD-10-CM

## 2014-01-06 DIAGNOSIS — E785 Hyperlipidemia, unspecified: Secondary | ICD-10-CM | POA: Diagnosis present

## 2014-01-06 DIAGNOSIS — I5021 Acute systolic (congestive) heart failure: Secondary | ICD-10-CM | POA: Diagnosis not present

## 2014-01-06 DIAGNOSIS — E119 Type 2 diabetes mellitus without complications: Secondary | ICD-10-CM | POA: Diagnosis present

## 2014-01-06 DIAGNOSIS — Z951 Presence of aortocoronary bypass graft: Secondary | ICD-10-CM

## 2014-01-06 DIAGNOSIS — K219 Gastro-esophageal reflux disease without esophagitis: Secondary | ICD-10-CM | POA: Diagnosis not present

## 2014-01-06 DIAGNOSIS — I5022 Chronic systolic (congestive) heart failure: Secondary | ICD-10-CM | POA: Diagnosis not present

## 2014-01-06 DIAGNOSIS — Z79899 Other long term (current) drug therapy: Secondary | ICD-10-CM | POA: Diagnosis not present

## 2014-01-06 HISTORY — PX: ABDOMINAL AORTIC ENDOVASCULAR STENT GRAFT: SHX5707

## 2014-01-06 LAB — CBC
HCT: 38.2 % — ABNORMAL LOW (ref 39.0–52.0)
Hemoglobin: 12.1 g/dL — ABNORMAL LOW (ref 13.0–17.0)
MCH: 30 pg (ref 26.0–34.0)
MCHC: 31.7 g/dL (ref 30.0–36.0)
MCV: 94.8 fL (ref 78.0–100.0)
Platelets: 134 10*3/uL — ABNORMAL LOW (ref 150–400)
RBC: 4.03 MIL/uL — ABNORMAL LOW (ref 4.22–5.81)
RDW: 13.8 % (ref 11.5–15.5)
WBC: 7 10*3/uL (ref 4.0–10.5)

## 2014-01-06 LAB — CREATININE, SERUM
Creatinine, Ser: 0.84 mg/dL (ref 0.50–1.35)
GFR calc Af Amer: >90 mL/min (ref >90)
GFR calc non Af Amer: 87 mL/min — ABNORMAL LOW (ref >90)

## 2014-01-06 LAB — GLUCOSE, CAPILLARY
Glucose-Capillary: 124 mg/dL — ABNORMAL HIGH (ref 70–99)
Glucose-Capillary: 121 mg/dL — ABNORMAL HIGH (ref 70–99)

## 2014-01-06 SURGERY — INSERTION, ENDOVASCULAR STENT GRAFT, AORTA, ABDOMINAL
Anesthesia: General | Site: Abdomen

## 2014-01-06 MED ORDER — FUROSEMIDE 80 MG PO TABS
80.0000 mg | ORAL_TABLET | Freq: Two times a day (BID) | ORAL | Status: DC
  Administered 2014-01-06 – 2014-01-07 (×2): 80 mg via ORAL
  Filled 2014-01-06 (×4): qty 1

## 2014-01-06 MED ORDER — MIDAZOLAM HCL 2 MG/2ML IJ SOLN
INTRAMUSCULAR | Status: DC | PRN
  Administered 2014-01-06: 2 mg via INTRAVENOUS

## 2014-01-06 MED ORDER — LEVOTHYROXINE SODIUM 50 MCG PO TABS
50.0000 ug | ORAL_TABLET | Freq: Every day | ORAL | Status: DC
  Administered 2014-01-07: 50 ug via ORAL
  Filled 2014-01-06 (×2): qty 1

## 2014-01-06 MED ORDER — LACTATED RINGERS IV SOLN
INTRAVENOUS | Status: DC | PRN
  Administered 2014-01-06: 07:00:00 via INTRAVENOUS

## 2014-01-06 MED ORDER — FENTANYL CITRATE 0.05 MG/ML IJ SOLN
INTRAMUSCULAR | Status: AC
  Filled 2014-01-06: qty 2

## 2014-01-06 MED ORDER — ALBUTEROL SULFATE (2.5 MG/3ML) 0.083% IN NEBU: 3.0000 mL | INHALATION_SOLUTION | Freq: Four times a day (QID) | RESPIRATORY_TRACT | Status: DC | PRN

## 2014-01-06 MED ORDER — SODIUM CHLORIDE 0.9 % IV SOLN: 500.0000 mL | Freq: Once | INTRAVENOUS | Status: AC | PRN

## 2014-01-06 MED ORDER — SENNOSIDES-DOCUSATE SODIUM 8.6-50 MG PO TABS
1.0000 | ORAL_TABLET | Freq: Every evening | ORAL | Status: DC | PRN
  Filled 2014-01-06: qty 1

## 2014-01-06 MED ORDER — FENTANYL CITRATE 0.05 MG/ML IJ SOLN
25.0000 ug | INTRAMUSCULAR | Status: DC | PRN
  Administered 2014-01-06 (×3): 50 ug via INTRAVENOUS

## 2014-01-06 MED ORDER — HEPARIN SODIUM (PORCINE) 1000 UNIT/ML IJ SOLN
INTRAMUSCULAR | Status: AC
  Filled 2014-01-06: qty 1

## 2014-01-06 MED ORDER — METOLAZONE 2.5 MG PO TABS
2.5000 mg | ORAL_TABLET | Freq: Every day | ORAL | Status: DC | PRN
  Filled 2014-01-06: qty 1

## 2014-01-06 MED ORDER — GLYCOPYRROLATE 0.2 MG/ML IJ SOLN
INTRAMUSCULAR | Status: DC | PRN
  Administered 2014-01-06: 0.4 mg via INTRAVENOUS
  Administered 2014-01-06: 0.2 mg via INTRAVENOUS

## 2014-01-06 MED ORDER — DOCUSATE SODIUM 100 MG PO CAPS: 100.0000 mg | ORAL_CAPSULE | Freq: Every day | ORAL | Status: DC

## 2014-01-06 MED ORDER — ISOSORBIDE MONONITRATE ER 60 MG PO TB24
60.0000 mg | ORAL_TABLET | Freq: Every day | ORAL | Status: DC
  Filled 2014-01-06: qty 1

## 2014-01-06 MED ORDER — SODIUM CHLORIDE 0.9 % IJ SOLN
INTRAMUSCULAR | Status: AC
  Filled 2014-01-06: qty 10

## 2014-01-06 MED ORDER — HEPARIN SODIUM (PORCINE) 1000 UNIT/ML IJ SOLN
INTRAMUSCULAR | Status: DC | PRN
  Administered 2014-01-06: 9000 [IU] via INTRAVENOUS

## 2014-01-06 MED ORDER — ATORVASTATIN CALCIUM 40 MG PO TABS
40.0000 mg | ORAL_TABLET | Freq: Every day | ORAL | Status: DC
  Administered 2014-01-06: 40 mg via ORAL
  Filled 2014-01-06 (×2): qty 1

## 2014-01-06 MED ORDER — SODIUM CHLORIDE 0.9 % IV SOLN: INTRAVENOUS | Status: DC

## 2014-01-06 MED ORDER — ONDANSETRON HCL 4 MG/2ML IJ SOLN: 4.0000 mg | Freq: Once | INTRAMUSCULAR | Status: DC | PRN

## 2014-01-06 MED ORDER — OCUVITE-LUTEIN PO CAPS
1.0000 | ORAL_CAPSULE | Freq: Every day | ORAL | Status: DC
  Administered 2014-01-06: 1 via ORAL
  Filled 2014-01-06 (×2): qty 1

## 2014-01-06 MED ORDER — PROTAMINE SULFATE 10 MG/ML IV SOLN
INTRAVENOUS | Status: AC
  Filled 2014-01-06: qty 5

## 2014-01-06 MED ORDER — HYDRALAZINE HCL 20 MG/ML IJ SOLN: 10.0000 mg | INTRAMUSCULAR | Status: DC | PRN

## 2014-01-06 MED ORDER — ISOSORBIDE MONONITRATE ER 30 MG PO TB24
30.0000 mg | ORAL_TABLET | Freq: Every day | ORAL | Status: DC
  Administered 2014-01-06: 30 mg via ORAL
  Filled 2014-01-06 (×2): qty 1

## 2014-01-06 MED ORDER — RAMIPRIL 5 MG PO CAPS
5.0000 mg | ORAL_CAPSULE | Freq: Every day | ORAL | Status: DC
  Filled 2014-01-06: qty 1

## 2014-01-06 MED ORDER — MIDAZOLAM HCL 2 MG/2ML IJ SOLN
INTRAMUSCULAR | Status: AC
  Filled 2014-01-06: qty 2

## 2014-01-06 MED ORDER — VITAMIN D3 25 MCG (1000 UNIT) PO TABS
1000.0000 [IU] | ORAL_TABLET | Freq: Every day | ORAL | Status: DC
  Administered 2014-01-06: 1000 [IU] via ORAL
  Filled 2014-01-06 (×2): qty 1

## 2014-01-06 MED ORDER — ONDANSETRON HCL 4 MG/2ML IJ SOLN
INTRAMUSCULAR | Status: DC | PRN
  Administered 2014-01-06: 4 mg via INTRAVENOUS

## 2014-01-06 MED ORDER — FENTANYL CITRATE 0.05 MG/ML IJ SOLN
INTRAMUSCULAR | Status: DC | PRN
  Administered 2014-01-06 (×2): 100 ug via INTRAVENOUS

## 2014-01-06 MED ORDER — GLYCOPYRROLATE 0.2 MG/ML IJ SOLN
INTRAMUSCULAR | Status: AC
  Filled 2014-01-06: qty 1

## 2014-01-06 MED ORDER — AMIODARONE HCL 200 MG PO TABS
200.0000 mg | ORAL_TABLET | Freq: Every day | ORAL | Status: DC
  Filled 2014-01-06: qty 1

## 2014-01-06 MED ORDER — OXYCODONE HCL 5 MG PO TABS
5.0000 mg | ORAL_TABLET | Freq: Once | ORAL | Status: AC | PRN
  Administered 2014-01-06: 5 mg via ORAL

## 2014-01-06 MED ORDER — IODIXANOL 320 MG/ML IV SOLN
INTRAVENOUS | Status: DC | PRN
  Administered 2014-01-06: 150 mL via INTRA_ARTERIAL

## 2014-01-06 MED ORDER — ACETAMINOPHEN 160 MG/5ML PO SOLN
325.0000 mg | ORAL | Status: DC | PRN
  Filled 2014-01-06: qty 20.3

## 2014-01-06 MED ORDER — SUCCINYLCHOLINE CHLORIDE 20 MG/ML IJ SOLN
INTRAMUSCULAR | Status: AC
  Filled 2014-01-06: qty 1

## 2014-01-06 MED ORDER — ENOXAPARIN SODIUM 40 MG/0.4ML ~~LOC~~ SOLN
40.0000 mg | SUBCUTANEOUS | Status: DC
  Administered 2014-01-06: 40 mg via SUBCUTANEOUS
  Filled 2014-01-06 (×2): qty 0.4

## 2014-01-06 MED ORDER — ONDANSETRON HCL 4 MG/2ML IJ SOLN: 4.0000 mg | Freq: Four times a day (QID) | INTRAMUSCULAR | Status: DC | PRN

## 2014-01-06 MED ORDER — POTASSIUM CHLORIDE CRYS ER 20 MEQ PO TBCR: 20.0000 meq | EXTENDED_RELEASE_TABLET | Freq: Every day | ORAL | Status: DC | PRN

## 2014-01-06 MED ORDER — TIOTROPIUM BROMIDE MONOHYDRATE 18 MCG IN CAPS
18.0000 ug | ORAL_CAPSULE | Freq: Every day | RESPIRATORY_TRACT | Status: DC
  Administered 2014-01-07: 18 ug via RESPIRATORY_TRACT
  Filled 2014-01-06: qty 5

## 2014-01-06 MED ORDER — LIDOCAINE HCL (CARDIAC) 20 MG/ML IV SOLN
INTRAVENOUS | Status: DC | PRN
  Administered 2014-01-06: 100 mg via INTRAVENOUS

## 2014-01-06 MED ORDER — ROCURONIUM BROMIDE 50 MG/5ML IV SOLN
INTRAVENOUS | Status: AC
  Filled 2014-01-06: qty 1

## 2014-01-06 MED ORDER — SODIUM CHLORIDE 0.9 % IR SOLN
Status: DC | PRN
  Administered 2014-01-06: 07:00:00

## 2014-01-06 MED ORDER — LABETALOL HCL 5 MG/ML IV SOLN: 10.0000 mg | INTRAVENOUS | Status: DC | PRN

## 2014-01-06 MED ORDER — RAMIPRIL 2.5 MG PO CAPS: 2.5000 mg | ORAL_CAPSULE | Freq: Two times a day (BID) | ORAL | Status: DC

## 2014-01-06 MED ORDER — OXYCODONE HCL 5 MG PO TABS
ORAL_TABLET | ORAL | Status: AC
  Filled 2014-01-06: qty 1

## 2014-01-06 MED ORDER — ROCURONIUM BROMIDE 100 MG/10ML IV SOLN
INTRAVENOUS | Status: DC | PRN
  Administered 2014-01-06: 50 mg via INTRAVENOUS

## 2014-01-06 MED ORDER — METOPROLOL TARTRATE 1 MG/ML IV SOLN: 2.0000 mg | INTRAVENOUS | Status: DC | PRN

## 2014-01-06 MED ORDER — NITROGLYCERIN 0.4 MG SL SUBL: 0.4000 mg | SUBLINGUAL_TABLET | SUBLINGUAL | Status: DC | PRN

## 2014-01-06 MED ORDER — DOPAMINE-DEXTROSE 3.2-5 MG/ML-% IV SOLN: 3.0000 ug/kg/min | INTRAVENOUS | Status: DC

## 2014-01-06 MED ORDER — ASPIRIN EC 81 MG PO TBEC
81.0000 mg | DELAYED_RELEASE_TABLET | Freq: Every day | ORAL | Status: DC
  Administered 2014-01-06: 81 mg via ORAL
  Filled 2014-01-06 (×2): qty 1

## 2014-01-06 MED ORDER — EPHEDRINE SULFATE 50 MG/ML IJ SOLN
INTRAMUSCULAR | Status: AC
  Filled 2014-01-06: qty 1

## 2014-01-06 MED ORDER — PHENYLEPHRINE 40 MCG/ML (10ML) SYRINGE FOR IV PUSH (FOR BLOOD PRESSURE SUPPORT)
PREFILLED_SYRINGE | INTRAVENOUS | Status: AC
  Filled 2014-01-06: qty 10

## 2014-01-06 MED ORDER — MAGNESIUM SULFATE 40 MG/ML IJ SOLN
2.0000 g | Freq: Every day | INTRAMUSCULAR | Status: DC | PRN
Start: 2014-01-06 — End: 2014-01-07

## 2014-01-06 MED ORDER — ALUM & MAG HYDROXIDE-SIMETH 200-200-20 MG/5ML PO SUSP
15.0000 mL | ORAL | Status: DC | PRN
Start: 2014-01-06 — End: 2014-01-07

## 2014-01-06 MED ORDER — BISACODYL 10 MG RE SUPP
10.0000 mg | Freq: Every day | RECTAL | Status: DC | PRN
Start: 2014-01-06 — End: 2014-01-07

## 2014-01-06 MED ORDER — FINASTERIDE 5 MG PO TABS
5.0000 mg | ORAL_TABLET | Freq: Every day | ORAL | Status: DC
  Administered 2014-01-06: 5 mg via ORAL
  Filled 2014-01-06 (×2): qty 1

## 2014-01-06 MED ORDER — ACETAMINOPHEN 325 MG PO TABS: 325.0000 mg | ORAL_TABLET | ORAL | Status: DC | PRN

## 2014-01-06 MED ORDER — PHENYLEPHRINE HCL 10 MG/ML IJ SOLN
INTRAMUSCULAR | Status: DC | PRN
  Administered 2014-01-06: 40 ug via INTRAVENOUS
  Administered 2014-01-06: 80 ug via INTRAVENOUS

## 2014-01-06 MED ORDER — GUAIFENESIN-DM 100-10 MG/5ML PO SYRP: 15.0000 mL | ORAL_SOLUTION | ORAL | Status: DC | PRN

## 2014-01-06 MED ORDER — BENZONATATE 100 MG PO CAPS: 200.0000 mg | ORAL_CAPSULE | Freq: Two times a day (BID) | ORAL | Status: DC | PRN

## 2014-01-06 MED ORDER — BENZONATATE 100 MG PO CAPS
200.0000 mg | ORAL_CAPSULE | Freq: Two times a day (BID) | ORAL | Status: DC | PRN
  Filled 2014-01-06: qty 2

## 2014-01-06 MED ORDER — FLEET ENEMA 7-19 GM/118ML RE ENEM: 1.0000 | ENEMA | Freq: Once | RECTAL | Status: AC | PRN

## 2014-01-06 MED ORDER — LIDOCAINE HCL (CARDIAC) 20 MG/ML IV SOLN
INTRAVENOUS | Status: AC
  Filled 2014-01-06: qty 5

## 2014-01-06 MED ORDER — SODIUM CHLORIDE 0.9 % IV SOLN
INTRAVENOUS | Status: DC
  Administered 2014-01-06: 11:00:00 via INTRAVENOUS

## 2014-01-06 MED ORDER — GLYCOPYRROLATE 0.2 MG/ML IJ SOLN
INTRAMUSCULAR | Status: AC
  Filled 2014-01-06: qty 2

## 2014-01-06 MED ORDER — SPIRONOLACTONE 25 MG PO TABS
25.0000 mg | ORAL_TABLET | Freq: Every day | ORAL | Status: DC
  Administered 2014-01-06: 25 mg via ORAL
  Filled 2014-01-06 (×2): qty 1

## 2014-01-06 MED ORDER — 0.9 % SODIUM CHLORIDE (POUR BTL) OPTIME
TOPICAL | Status: DC | PRN
  Administered 2014-01-06: 1000 mL

## 2014-01-06 MED ORDER — PROPOFOL 10 MG/ML IV BOLUS
INTRAVENOUS | Status: AC
  Filled 2014-01-06: qty 20

## 2014-01-06 MED ORDER — PROTAMINE SULFATE 10 MG/ML IV SOLN
INTRAVENOUS | Status: DC | PRN
  Administered 2014-01-06: 5 mg via INTRAVENOUS
  Administered 2014-01-06: 20 mg via INTRAVENOUS
  Administered 2014-01-06: 25 mg via INTRAVENOUS

## 2014-01-06 MED ORDER — NEOSTIGMINE METHYLSULFATE 1 MG/ML IJ SOLN
INTRAMUSCULAR | Status: DC | PRN
  Administered 2014-01-06: 3 mg via INTRAVENOUS

## 2014-01-06 MED ORDER — ASPIRIN 81 MG PO TABS: 81.0000 mg | ORAL_TABLET | Freq: Every day | ORAL | Status: DC

## 2014-01-06 MED ORDER — FENTANYL CITRATE 0.05 MG/ML IJ SOLN
INTRAMUSCULAR | Status: AC
  Filled 2014-01-06: qty 5

## 2014-01-06 MED ORDER — PROPOFOL 10 MG/ML IV BOLUS
INTRAVENOUS | Status: DC | PRN
  Administered 2014-01-06: 100 mg via INTRAVENOUS

## 2014-01-06 MED ORDER — PANTOPRAZOLE SODIUM 40 MG PO TBEC
40.0000 mg | DELAYED_RELEASE_TABLET | Freq: Every day | ORAL | Status: DC
  Administered 2014-01-06: 40 mg via ORAL
  Filled 2014-01-06: qty 1

## 2014-01-06 MED ORDER — ISOSORBIDE MONONITRATE ER 30 MG PO TB24: 30.0000 mg | ORAL_TABLET | Freq: Two times a day (BID) | ORAL | Status: DC

## 2014-01-06 MED ORDER — ONDANSETRON HCL 4 MG/2ML IJ SOLN
INTRAMUSCULAR | Status: AC
  Filled 2014-01-06: qty 2

## 2014-01-06 MED ORDER — SUCCINYLCHOLINE CHLORIDE 20 MG/ML IJ SOLN
INTRAMUSCULAR | Status: DC | PRN
  Administered 2014-01-06: 60 mg via INTRAVENOUS

## 2014-01-06 MED ORDER — CLOPIDOGREL BISULFATE 75 MG PO TABS
75.0000 mg | ORAL_TABLET | Freq: Every day | ORAL | Status: DC
  Administered 2014-01-06 – 2014-01-07 (×2): 75 mg via ORAL
  Filled 2014-01-06 (×3): qty 1

## 2014-01-06 MED ORDER — RAMIPRIL 2.5 MG PO CAPS
2.5000 mg | ORAL_CAPSULE | Freq: Every day | ORAL | Status: DC
  Administered 2014-01-06: 2.5 mg via ORAL
  Filled 2014-01-06 (×2): qty 1

## 2014-01-06 MED ORDER — CARVEDILOL 25 MG PO TABS
25.0000 mg | ORAL_TABLET | Freq: Two times a day (BID) | ORAL | Status: DC
  Administered 2014-01-06 – 2014-01-07 (×2): 25 mg via ORAL
  Filled 2014-01-06 (×4): qty 1

## 2014-01-06 MED ORDER — PHENYLEPHRINE HCL 10 MG/ML IJ SOLN
10.0000 mg | INTRAVENOUS | Status: DC | PRN
  Administered 2014-01-06: 25 ug/min via INTRAVENOUS

## 2014-01-06 MED ORDER — PHENOL 1.4 % MT LIQD: 1.0000 | OROMUCOSAL | Status: DC | PRN

## 2014-01-06 MED ORDER — MORPHINE SULFATE 2 MG/ML IJ SOLN: 2.0000 mg | INTRAMUSCULAR | Status: DC | PRN

## 2014-01-06 MED ORDER — OXYCODONE HCL 5 MG/5ML PO SOLN: 5.0000 mg | Freq: Once | ORAL | Status: AC | PRN

## 2014-01-06 MED ORDER — OXYCODONE HCL 5 MG PO TABS: 5.0000 mg | ORAL_TABLET | ORAL | Status: DC | PRN

## 2014-01-06 MED ORDER — ACETAMINOPHEN 650 MG RE SUPP: 325.0000 mg | RECTAL | Status: DC | PRN

## 2014-01-06 SURGICAL SUPPLY — 79 items
BAG BANDED W/RUBBER/TAPE 36X54 (MISCELLANEOUS) ×2 IMPLANT
BAG SNAP BAND KOVER 36X36 (MISCELLANEOUS) ×2 IMPLANT
BALLN MUSTANG 8X20X75 (BALLOONS) ×4
BALLOON MUSTANG 8X20X75 (BALLOONS) ×2 IMPLANT
BLADE SURG ROTATE 9660 (MISCELLANEOUS) ×2 IMPLANT
CANISTER SUCTION 2500CC (MISCELLANEOUS) ×2 IMPLANT
CATH BEACON 5.038 65CM KMP-01 (CATHETERS) ×2 IMPLANT
CATH OMNI FLUSH .035X70CM (CATHETERS) ×2 IMPLANT
CLIP TI MEDIUM 24 (CLIP) IMPLANT
CLIP TI WIDE RED SMALL 24 (CLIP) IMPLANT
COVER DOME SNAP 22 D (MISCELLANEOUS) ×2 IMPLANT
COVER MAYO STAND STRL (DRAPES) ×2 IMPLANT
COVER PROBE W GEL 5X96 (DRAPES) ×2 IMPLANT
COVER SURGICAL LIGHT HANDLE (MISCELLANEOUS) ×2 IMPLANT
DERMABOND ADVANCED (GAUZE/BANDAGES/DRESSINGS) ×1
DERMABOND ADVANCED .7 DNX12 (GAUZE/BANDAGES/DRESSINGS) ×1 IMPLANT
DEVICE CLOSURE PERCLS PRGLD 6F (VASCULAR PRODUCTS) ×4 IMPLANT
DRAPE TABLE COVER HEAVY DUTY (DRAPES) ×2 IMPLANT
DRESSING OPSITE X SMALL 2X3 (GAUZE/BANDAGES/DRESSINGS) ×4 IMPLANT
DRYSEAL FLEXSHEATH 12FR 33CM (SHEATH) ×1
ELECT CAUTERY BLADE 6.4 (BLADE) ×2 IMPLANT
ELECT REM PT RETURN 9FT ADLT (ELECTROSURGICAL) ×4
ELECTRODE REM PT RTRN 9FT ADLT (ELECTROSURGICAL) ×2 IMPLANT
EXCLUDER TNK LEG 35MX14X14 (Endovascular Graft) ×1 IMPLANT
EXCLUDER TRUNK LEG 35MX14X14 (Endovascular Graft) ×2 IMPLANT
GAUZE SPONGE 2X2 8PLY STRL LF (GAUZE/BANDAGES/DRESSINGS) ×1 IMPLANT
GLOVE BIO SURGEON STRL SZ7 (GLOVE) ×2 IMPLANT
GLOVE BIO SURGEON STRL SZ7.5 (GLOVE) ×2 IMPLANT
GLOVE BIOGEL PI IND STRL 6.5 (GLOVE) ×2 IMPLANT
GLOVE BIOGEL PI IND STRL 7.5 (GLOVE) ×2 IMPLANT
GLOVE BIOGEL PI IND STRL 8 (GLOVE) ×1 IMPLANT
GLOVE BIOGEL PI INDICATOR 6.5 (GLOVE) ×2
GLOVE BIOGEL PI INDICATOR 7.5 (GLOVE) ×2
GLOVE BIOGEL PI INDICATOR 8 (GLOVE) ×1
GLOVE ECLIPSE 7.5 STRL STRAW (GLOVE) ×4 IMPLANT
GOWN STRL REUS W/ TWL LRG LVL3 (GOWN DISPOSABLE) ×2 IMPLANT
GOWN STRL REUS W/ TWL XL LVL3 (GOWN DISPOSABLE) ×2 IMPLANT
GOWN STRL REUS W/TWL LRG LVL3 (GOWN DISPOSABLE) ×2
GOWN STRL REUS W/TWL XL LVL3 (GOWN DISPOSABLE) ×2
GRAFT BALLN CATH 65CM (STENTS) ×1 IMPLANT
GRAFT EXCLUDER LEG 12X10 (Endovascular Graft) ×2 IMPLANT
GRAFT EXCLUDER LEG 16X12 (Endovascular Graft) ×2 IMPLANT
KIT BASIN OR (CUSTOM PROCEDURE TRAY) ×2 IMPLANT
KIT ENCORE 26 ADVANTAGE (KITS) ×4 IMPLANT
KIT ROOM TURNOVER OR (KITS) ×2 IMPLANT
NEEDLE PERC 18GX7CM (NEEDLE) ×2 IMPLANT
NS IRRIG 1000ML POUR BTL (IV SOLUTION) ×2 IMPLANT
PACK AORTA (CUSTOM PROCEDURE TRAY) ×2 IMPLANT
PAD ARMBOARD 7.5X6 YLW CONV (MISCELLANEOUS) ×4 IMPLANT
PAD ONESTEP ZOLL R SERIES ADT (MISCELLANEOUS) ×2 IMPLANT
PENCIL BUTTON HOLSTER BLD 10FT (ELECTRODE) IMPLANT
PERCLOSE PROGLIDE 6F (VASCULAR PRODUCTS) ×8
PROTECTION STATION PRESSURIZED (MISCELLANEOUS) ×2
SET MICROPUNCTURE 5F STIFF (MISCELLANEOUS) ×4 IMPLANT
SHEATH AVANTI 11CM 8FR (MISCELLANEOUS) ×2 IMPLANT
SHEATH BRITE TIP 8FR 23CM (MISCELLANEOUS) ×2 IMPLANT
SHEATH DRYSEAL FLEX 12FR 33CM (SHEATH) ×1 IMPLANT
SHEATH SOLO 18/21FX25CM (SHEATH) ×2 IMPLANT
SPONGE GAUZE 2X2 STER 10/PKG (GAUZE/BANDAGES/DRESSINGS) ×1
STAPLER VISISTAT 35W (STAPLE) IMPLANT
STATION PROTECTION PRESSURIZED (MISCELLANEOUS) ×1 IMPLANT
STENT GRAFT BALLN CATH 65CM (STENTS) ×1
STOPCOCK MORSE 400PSI 3WAY (MISCELLANEOUS) ×2 IMPLANT
SUT ETHILON 3 0 PS 1 (SUTURE) IMPLANT
SUT MNCRL AB 4-0 PS2 18 (SUTURE) ×4 IMPLANT
SUT PROLENE 5 0 C 1 24 (SUTURE) IMPLANT
SUT VIC AB 2-0 CTX 36 (SUTURE) IMPLANT
SUT VIC AB 3-0 SH 27 (SUTURE)
SUT VIC AB 3-0 SH 27X BRD (SUTURE) IMPLANT
SYR 20CC LL (SYRINGE) ×4 IMPLANT
SYR 30ML LL (SYRINGE) IMPLANT
SYR MEDRAD MARK V 150ML (SYRINGE) ×2 IMPLANT
SYRINGE 10CC LL (SYRINGE) ×6 IMPLANT
TOWEL OR 17X24 6PK STRL BLUE (TOWEL DISPOSABLE) ×6 IMPLANT
TOWEL OR 17X26 10 PK STRL BLUE (TOWEL DISPOSABLE) ×4 IMPLANT
TRAY FOLEY CATH 16FRSI W/METER (SET/KITS/TRAYS/PACK) IMPLANT
TUBING HIGH PRESSURE 120CM (CONNECTOR) ×2 IMPLANT
WIRE AMPLATZ SS-J .035X180CM (WIRE) ×4 IMPLANT
WIRE BENTSON .035X145CM (WIRE) ×4 IMPLANT

## 2014-01-06 NOTE — OR Nursing (Signed)
Addendum: Skin tear to right groin noted upon removal of [place] drape at end or procedure.

## 2014-01-06 NOTE — Transfer of Care (Signed)
Immediate Anesthesia Transfer of Care Note  Patient: [name]  Procedure(s) Performed: Procedure(s): ABDOMINAL AORTIC ENDOVASCULAR STENT GRAFT- GORE; ULTRASOUND GUIDED ([name])  Patient Location: PACU  Anesthesia Type:General  Level of Consciousness: awake, alert  and oriented  Airway & Oxygen Therapy: Patient Spontanous Breathing and Patient connected to nasal cannula oxygen  Post-op Assessment: Report given to PACU RN, Post -op Vital signs reviewed and stable and Patient moving all extremities  Post vital signs: Reviewed and stable  Complications: No apparent anesthesia complications

## 2014-01-06 NOTE — Op Note (Signed)
OPERATIVE NOTE   PROCEDURE:  1. Bilateral common femoral artery cannulation under ultrasound exposure 2. "Preclose" repair of bilateral common femoral artery  3. Placement of catheter in aorta x 2 4. Aortogram 5. Repair of aorta with modular bifurcated prosthesis with one limb (35 mm x 14 mm x 14 cm) 6. Placement of right iliac extension limb (12 mm x 7 cm) 7. Placement of left iliac limb (12 mm x 10 cm) 8. Angioplasty of bilateral common iliac artery (8 mm x 20 mm) 9. Radiology S&I  PRE-OPERATIVE DIAGNOSIS: large abdominal aortic aneurysm   POST-OPERATIVE DIAGNOSIS: same as above   CO-SURGEONS: Adele Barthel, MD; Gae Gallop, MD   ANESTHESIA: general   ESTIMATED BLOOD LOSS: 50 cc   CONTRAST: 100 cc   FINDING(S):  1. No type I endoleak 2. Delayed type II endoleak  3. Bilateral renal arteries and internal iliac artery widely patent at end of case 4. Successful exclusion of abdominal aortic aneurysm  5. B dopplerable posterior tibial artery at end of case  INDICATIONS:  [name] is a 68 y.o. male who present with large abdominal aortic aneurysm. The patient family is aware the risks of endovascular aortic surgery include but are not limited to: bleeding, need for transfusion, infection, death, stroke, paralysis, wound complications, impotence, bowel ischemia, extended ventilation, limb dysfunction and need for secondary procedures. Overall, I cited an estimate of 1-2% mortality rate and 15% morbidities rate.   DESCRIPTION:  After full informed written consent was obtained from the patient, the patient was brought back to the operating room, and placed supine upon the operating table. He already had a A-line place. After obtaining adequate anesthesia, he was prepped and draped in the standard fashion for an open or endovascular aortic aneurysm repair. I turned my attention to the right groin. Under ultrasound guidance, I made a stab incision over the artery and dissected  with a hemostat to the common femoral artery through the subcutaneous tissue. I then cannulated the right common femoral artery with a micropuncture needle.  The microwire was passed into the common femoral artery.  The microsheath was loaded over the wire and the microsheath loaded.  The wire and dilator was removed, and I passed a Benson wire into the aorta. I dilated the tract with a 8-Fr dilator. The first Proglide device was loaded over the wire and deployed at 30 degrees medially. The sutures from the Proglide were removed and tagged. [facility] wire was reloaded in the Proglide device and advanced into the aorta. The device was exchanged for a new Proglide. In a similar fashion, the Proglide was deployed at 30 degrees laterally. The sutures were again tagged and the wire readvanced through the device. In such fashion, the "Preclose" technique was completed for repair of the right common femoral artery. At this point, the second Proglide device was exchanged for a short 8-Fr sheath. At this point, Dr. Scot Dock completed the "Preclose" technique on the left common femoral artery, in a similar fashion. He exchanged the second Proglide for a long 8-Fr sheath which was advanced into the left iliac system.   At this point, using the KMP catheter, I steered into the suprarenal aorta. The wire was exchanged for an Amplatz wire. The patient was given 9000 units of Heparin intravenously, which was a therapeutic bolus. At this point, Dr. Oneida Alar placed a pigtail catheter over the left wire into the suprarenal position. I exchanged the right sheath for a 18-French Sonopath balloon expandable  sheath which was advanced into the distal aorta.  The balloon was inflated to 20 atm for 1 minutes to fully deploy the sheath.  The dilator was removed.  The main body (35 mm x 14 mm x 14 cm) was loaded over the left wire and advanced to L1. The pigtail catheter was connected to the power injector circuit after a de-airring and  de-clotting maneuver. A power injector aortogram was completed, demonstrating the position of the left renal artery which was stented. The main body was deployed below the level of the left renal artery. A repeat aortogram was completed, which demonstrated: drop off the main body ~1-2 mm distal to the left renal artery. The [facility] wire was reloaded through the pigtail catheter and the catheter pulled distal to the contralateral gate. The catheter was exchanged for a KMP catheter. Using the catheter and [facility] wire, Dr. Scot Dock was able to cannulate the contralateral gate, which was in a crossed limb configuration. He was then able to advance both of these into the suprarenal aorta. The wire was exchanged for an Amplatz wire. The catheter was exchanged for a pigtail catheter which was advanced into the main body. The wire was pulled back and the catheter was spun in the main body to prove successful cannulation. The wire and pigtail catheter were advanced through the main body. A marker on pigtail catheter was aligned with the flow divider. The left sheath was pulled back on the left side and Dr. Scot Dock did a retrograde injection from the sheath demonstrating the level of the internal iliac artery. Then based on the measurements, one limb extensions was going to be necessary. The sheath was exchanged for a 12 Fr Dryseal sheath which was hubbed.A 12 mm x 10 cm limb was selected and deployed with adequate overlap. The stent delivery device was removed.   At this point, I deployed the rest of the right limb and removed the main body stent delivery device. I did a retrograde injection on the right side after pulling the sheath distal to the internal iliac artery.  Based on the images a 12 mm x 7 cm limb extension was selected.  I placed it over the right wire and deployed it with adequate overlap. The stent delivery device was removed.   The Q45 aortic molding balloon was then loaded onto the left wire. Dr. Scot Dock  inflated the balloon at the proximal aspect of the graft while I pushed forward on the balloon, to avoid having the main body drop. He then molded all the overlapping segments and distal iliac limb. I removed the balloon and replaced it on the right wire. I inflated the balloon at the overlap in the right iliac limb and distally.  A pigtail catheter was loaded over the left wire and placed in suprarenal position. A completion aortogram was completed: incomplete deployment of proximal graft and type I or II endoleak in mid-aorta.  Additionally, the main body appeared to have dropped 1-2 mm but there still was >2 cm aortic neck coverage.  I replaced the Q45 balloon and reinflated it at the top of the main body.  On completion aortogram, the top of the graft was fully deployed, the previous rapid endoleak had resolved and only a delayed type II endoleak was present.  Both renal arteries were open as was both internal iliac arteries.  The completion also demonstrated some stenoses at the prior common iliac artery stents.  I felt high pressure insufflation was necessary due to the  multiple stents now overlapping this segment.  A 8 mm x 20 mm non-compliance angioplasty balloon was loaded into both common iliac arteries and inflated in a kissing technique to address the segments with some in-stent restenosis.  The completion angiogram demonstrated no further waist in both common iliac artery.    At this point, the left pigtail catheter was straightened out with a Britta Mccreedy wire. The right Amplatz wire was exchanged for a [employer] wire. The left sheath was removed while pressure was held. The left common femoral artery was repaired in two stages. First, both sets of Proglide stitches were tightened down sequentially. There was minimal bleeding at this point, so I removed the wire. Second, both sets of Proglide stitches were tightened down and the knot set by pulling the white sutures. A hemostat was clamped on all four sutures  to apply additional pressure. In a similar fashion, I repaired the right common femoral artery. Pressure was held to the right common femoral artery with a hemostat clamped to the four sutures on the right side.  The patient was given 50 mg of Protamine. After waiting 3 minutes, no further bleeding was present. All sutures were transected with the Proglide suture scissor. Both groin stab incisions were repaired with a U stitch of 4-0 Monocryl in both groins. Both groins were cleaned, dried, and reinforced with Dermabond. I checked distally and there were dopplerable posterior tibial artery signals similar to pre-op.    SPECIMEN(S): none   COMPLICATIONS:   CONDITION: stable   Adele Barthel, MD [facility] [place] Office: [phone] Pager: [phone]  01/06/2014, 10:02 AM

## 2014-01-06 NOTE — H&P (Signed)
Brief History and Physical  History of Present Illness  [name] is a 68 y.o. male who presents with chief complaint: AAA.  The patient presents today for EVAR.    Past Medical History  Diagnosis Date  . Myocardial infarction   . Hypertension   . CHF (congestive heart failure)   . COPD (chronic obstructive pulmonary disease)   . GERD (gastroesophageal reflux disease)   . AAA (abdominal aortic aneurysm)   . Tremor   . Dysrhythmia     ICD-defibrillator  . Carotid artery stenosis   . Peripheral vascular disease   . Adenomatous colon polyp 01/2004  . Diverticulosis   . AVM (arteriovenous malformation)   . CAD (coronary artery disease)   . HLD (hyperlipidemia)   . Hypothyroidism   . BPH (benign prostatic hypertrophy)   . Fatigue   . Automatic implantable cardioverter-defibrillator in situ   . Sleep apnea     cpap 5 yrs  . H/O hiatal hernia   . Anemia     hx  . Complication of anesthesia     claustrophobic, unabe to lie on back more than 4 hours at time due to back  . Diabetes mellitus     DIET CONTROLLED    Past Surgical History  Procedure Laterality Date  . Coronary artery bypass graft    . Cardiac catheterization    . Coronary angioplasty    . Renal artery stent    . Angioplasty      BILATERAL  LE  W/STENTS  . Endarterectomy  01/04/2012    Procedure: ENDARTERECTOMY CAROTID;left  Surgeon: Hinda Lenis, MD;  Location: [place];  Service: Vascular;  Laterality: Left;  with patch angioplasty  . Carotid endarterectomy Left 01/04/12  . Cardiac defibrillator placement    . Iron infusion  June 16, 2012    History   Social History  . Marital Status: Married    Spouse Name: [name]    Number of Children: 2  . Years of Education: [name]   Occupational History  . retired Engineer, structural    Social History Main Topics  . Smoking status: Former Smoker -- 1.50 packs/day for 40 years    Types: Cigarettes    Quit date: 12/11/2002  . Smokeless tobacco: Never Used    . Alcohol Use: No  . Drug Use: No  . Sexual Activity: Not Currently   Other Topics Concern  . Not on file   Social History Narrative  . No narrative on file    Family History  Problem Relation Age of Onset  . Colon cancer Sister   . Diabetes Sister   . Heart disease Sister   . Heart disease Brother   . Lung cancer Mother   . Cancer Mother   . Heart attack Maternal Grandmother   . Colon polyps Sister     No current facility-administered medications on file prior to encounter.   Current Outpatient Prescriptions on File Prior to Encounter  Medication Sig Dispense Refill  . amiodarone (CORDARONE) 200 MG tablet Take 200 mg by mouth daily.       Marland Kitchen aspirin 81 MG tablet Take 81 mg by mouth daily.      Marland Kitchen atorvastatin (LIPITOR) 40 MG tablet Take 1 tablet (40 mg total) by mouth daily.  90 tablet  3  . carvedilol (COREG) 25 MG tablet Take 1 tablet (25 mg total) by mouth 2 (two) times daily with a meal.  180 tablet  3  .  fish oil-omega-3 fatty acids 1000 MG capsule Take 1 g by mouth daily.       . furosemide (LASIX) 80 MG tablet Take 1 tablet (80 mg total) by mouth 2 (two) times daily.  180 tablet  3  . levothyroxine (SYNTHROID, LEVOTHROID) 50 MCG tablet TAKE 1 TABLET (50 MCG TOTAL) BY MOUTH DAILY.  90 tablet  1  . Multiple Vitamins-Minerals (MENS 50+ MULTI VITAMIN/MIN) TABS Take by mouth daily.      . multivitamin-lutein (OCUVITE-LUTEIN) CAPS Take 1 capsule by mouth daily.        . ramipril (ALTACE) 2.5 MG capsule Take 2.5-5 mg by mouth 2 (two) times daily. Take 2 capsules (5mg ) in AM. Take 1 capsule (2.5mg ) in PM      . spironolactone (ALDACTONE) 25 MG tablet Take 1 tablet (25 mg total) by mouth daily.  90 tablet  3  . tiotropium (SPIRIVA HANDIHALER) 18 MCG inhalation capsule Place 1 capsule (18 mcg total) into inhaler and inhale daily.  90 capsule  3  . clopidogrel (PLAVIX) 75 MG tablet Take 75 mg by mouth daily.        . finasteride (PROSCAR) 5 MG tablet Take 5 mg by mouth daily.       . metolazone (ZAROXOLYN) 2.5 MG tablet Take 2.5 mg by mouth daily as needed (for swelling).       . nitroGLYCERIN (NITROSTAT) 0.4 MG SL tablet Place 1 tablet (0.4 mg total) under the tongue every 5 (five) minutes as needed.  25 tablet  3    Allergies  Allergen Reactions  . Ace Inhibitors     REACTION: cough  . Penicillins Other (See Comments)    "childhood allergy"  . Tussionex Pennkinetic Er [Hydrocod Polst-Cpm Polst Er] Other (See Comments)    "caused prostate problems"    Review of Systems: As listed above, otherwise negative.  Physical Examination  Filed Vitals:   01/06/14 0601  BP: 139/59  Pulse: 62  Temp: 98.3 F (36.8 C)  TempSrc: Oral  Resp: 18  SpO2: 94%    General: A&O x 3, WDWN  Pulmonary: Sym exp, good air movt, CTAB, no rales, rhonchi, & wheezing  Cardiac: RRR, Nl S1, S2, no Murmurs, rubs or gallops  Gastrointestinal: soft, NTND, -G/R, - HSM, - masses, - CVAT B  Musculoskeletal: M/S 5/5 throughout , Extremities without ischemic changes   Laboratory See [name] is a 68 y.o. male who presents with: AAA.   The patient is scheduled for: EVAR The patient is aware the risks of endovascular aortic surgery include but are not limited to: bleeding, need for transfusion, infection, death, stroke, paralysis, wound complications, bowel ischemia, extended ventilation, anaphylactic reaction to contrast, contrast induced nephropathy, embolism, and need for additional procedure to address endoleaks.   Overall, I cited a mortality rate of 1-2% and morbidity rate of 15%.  The patient is aware of the risks and agrees to proceed.  Adele Barthel, MD [facility] [place] Office: [phone] Pager: [phone]  01/06/2014, 7:19 AM

## 2014-01-06 NOTE — Progress Notes (Signed)
Pt admitted from PACU, pt oriented to room, family at [facility], all questions answered

## 2014-01-06 NOTE — Anesthesia Postprocedure Evaluation (Signed)
Anesthesia Post Note  Patient: [name]  Procedure(s) Performed: Procedure(s) (LRB): ABDOMINAL AORTIC ENDOVASCULAR STENT GRAFT- GORE; ULTRASOUND GUIDED ([name])  Anesthesia type: General  Patient location: PACU  Post pain: Pain level controlled and Adequate analgesia  Post assessment: Post-op Vital signs reviewed, Patient's Cardiovascular Status Stable, Respiratory Function Stable, Patent Airway and Pain level controlled  Last Vitals:  Filed Vitals:   01/06/14 1100  BP:   Pulse: 57  Temp:   Resp: 20    Post vital signs: Reviewed and stable  Level of consciousness: awake, alert  and oriented  Complications: No apparent anesthesia complications

## 2014-01-06 NOTE — Progress Notes (Signed)
Report to C. Harris RN as primary caregiver.

## 2014-01-06 NOTE — Op Note (Signed)
    NAME: [name]   MRN: [id number] DOB: 07-30-1946    DATE OF OPERATION: 01/06/2014  PREOP DIAGNOSIS: Abdominal aortic aneurysm  POSTOP DIAGNOSIS: abdominal aortic aneurysm  PROCEDURE: percutaneous endovascular repair of abdominal aortic aneurysm  CO-SURGEON's: Judeth Cornfield. Scot Dock, MD, Adele Barthel, MD  ANESTHESIA: Gen.   EBL: per anesthesia record  INDICATIONS: [name] is a 68 y.o. male who presents for elective endovascular aneurysm repair  TECHNIQUE: Patient was taken to the operating room and received a general anesthetic. Monitoring lines had been placed by anesthesia. On the left side, under ultrasound guidance, the left common femoral artery was cannulated and a guidewire introduced into the infrarenal aorta. The track was dilated with 8 [place] sheath. The first Perclose device was placed and rotated 30 medially and deployed. A second device was placed and rotated 30 laterally and deployed. An 8 French sheath was then placed in the left groin.  3 closure of the right side as dictated by Dr. Bridgett Larsson. Placement of the trunk ipsilateral component is dictated also by Dr. Bridgett Larsson. Once the trunk ipsilateral component was placed, the contralateral gate was cannulated using a Kumpe catheter and a Careers adviser. I then exchanged the Kumpe catheter for a pigtail catheter which was twisted within the main body of the graft to be sure that it was within the lumen. An Amplatz wire was then placed. The contralateral limb was selected after retrograde arteriogram confirmed a length so that the graft to be placed above the hypogastric artery. A 12 mm x 10 cm graft was placed overlapping into the contralateral gate. Once the ipsilateral limb was deployed and then subsequently extended transition zones in the proximal graft were ballooned.  At the completion of the procedure the left groin was closed and there was good hemostasis. Likewise the right groin was closed with the 2 previously placed  Perclose devices noted hemostasis. Incisions were closed with 4-0 Monocryl suture. The patient tolerated the procedure well. All needle sponge.  Deitra Mayo, MD, FACS [facility]  DATE OF DICTATION:   01/06/2014

## 2014-01-06 NOTE — Progress Notes (Signed)
Dr Ermalene Postin req. [place] rep. be apprised of surgery, magnet over device during case, pt in PACU. Rep. Jason updated. He said that if "no beeping sound heard over device with a stethoscope", device is OK. No beeping sound was heard by myself, also by Julieta Bellini RN. Will cont to monitor.  Lennie Muckle PA also here, and she has called cardiology to see pt.

## 2014-01-06 NOTE — Anesthesia Preprocedure Evaluation (Signed)
Anesthesia Evaluation  Patient identified by MRN, date of birth, ID band Patient awake    Reviewed: Allergy & Precautions, H&P , NPO status , Patient's Chart, lab work & pertinent test results, reviewed documented beta blocker date and time   History of Anesthesia Complications Negative for: history of anesthetic complications  Airway Mallampati: III TM Distance: >3 FB Neck ROM: Full    Dental  (+) Teeth Intact, Dental Advisory Given and Chipped,    Pulmonary shortness of breath and with exertion, sleep apnea , COPDformer smoker,          Cardiovascular Exercise Tolerance: Good hypertension, + CAD, + Past MI, + Peripheral Vascular Disease and +CHF + dysrhythmias + Cardiac Defibrillator     Neuro/Psych    GI/Hepatic hiatal hernia, GERD-  Medicated and Controlled,  Endo/Other  diabetes, Type 2Hypothyroidism   Renal/GU      Musculoskeletal   Abdominal   Peds  Hematology  (+) anemia ,   Anesthesia Other Findings   Reproductive/Obstetrics                           Anesthesia Physical Anesthesia Plan  ASA: IV  Anesthesia Plan: General   Post-op Pain Management:    Induction: Intravenous  Airway Management Planned: Oral ETT  Additional Equipment:   Intra-op Plan:   Post-operative Plan:   Informed Consent: I have reviewed the patients History and Physical, chart, labs and discussed the procedure including the risks, benefits and alternatives for the proposed anesthesia with the patient or authorized representative who has indicated his/her understanding and acceptance.   Dental advisory given  Plan Discussed with: Anesthesiologist and CRNA  Anesthesia Plan Comments:         Anesthesia Quick Evaluation

## 2014-01-06 NOTE — Consult Note (Addendum)
CONSULT NOTE  Date: 01/06/2014               Patient Name:  [name] MRN: [id number]  DOB: [birth year] Age / Sex: 68 y.o., male        PCP: [name] Primary Cardiologist: Debara Pickett            Referring Physician: Adele Barthel, MD              Reason for Consult: Bradycardia during induction of anesthesia.           History of Present Illness: Patient is a 68 y.o. male with a PMHx of PVD, AAA, CAD - s/p CABG in 1986 and  1996,  Ischemic cardiomyopathy with EF of 26%, s/p ICD for primary prevention who was admitted to [facility] on 01/06/2014 for endovascular repair of his AAA.  He was noted to have bradycardia during induction of anesthesia and we were called to assess.  I cannot find any recording of the bradycardia.  His ICD is set at a backup pacing rate of 40.  Currently, he is in the PACU, recovering nicely, awake, alert.  Denies any chest pain or dyspnea.   His home meds include coreg 25 mg bid, amiodarone 200 a day,.  He is also on atorvastatin , plavix, synthroid, zaroxolyn, altace, aldactone, imdur, inhalers.       Medications: Outpatient medications: Prescriptions prior to admission  Medication Sig Dispense Refill  . albuterol (PROVENTIL HFA;VENTOLIN HFA) 108 (90 BASE) MCG/ACT inhaler Inhale 2 puffs into the lungs every 6 (six) hours as needed for wheezing or shortness of breath.  1 Inhaler  3  . amiodarone (CORDARONE) 200 MG tablet Take 200 mg by mouth daily.       Marland Kitchen aspirin 81 MG tablet Take 81 mg by mouth daily.      Marland Kitchen atorvastatin (LIPITOR) 40 MG tablet Take 1 tablet (40 mg total) by mouth daily.  90 tablet  3  . benzonatate (TESSALON) 200 MG capsule Take 1 capsule (200 mg total) by mouth 2 (two) times daily as needed for cough.  20 capsule  0  . carvedilol (COREG) 25 MG tablet Take 1 tablet (25 mg total) by mouth 2 (two) times daily with a meal.  180 tablet  3  . fish oil-omega-3 fatty acids 1000 MG capsule Take 1 g by mouth daily.       . furosemide (LASIX)  80 MG tablet Take 1 tablet (80 mg total) by mouth 2 (two) times daily.  180 tablet  3  . isosorbide mononitrate (IMDUR) 60 MG 24 hr tablet Take 30-60 mg by mouth 2 (two) times daily. Take 1 tablet every am and 0.5 tablet at bedtime      . levothyroxine (SYNTHROID, LEVOTHROID) 50 MCG tablet TAKE 1 TABLET (50 MCG TOTAL) BY MOUTH DAILY.  90 tablet  1  . Multiple Vitamins-Minerals (MENS 50+ MULTI VITAMIN/MIN) TABS Take by mouth daily.      . multivitamin-lutein (OCUVITE-LUTEIN) CAPS Take 1 capsule by mouth daily.        . ramipril (ALTACE) 2.5 MG capsule Take 2.5-5 mg by mouth 2 (two) times daily. Take 2 capsules (5mg ) in AM. Take 1 capsule (2.5mg ) in PM      . spironolactone (ALDACTONE) 25 MG tablet Take 1 tablet (25 mg total) by mouth daily.  90 tablet  3  . tiotropium (SPIRIVA HANDIHALER) 18 MCG inhalation capsule Place 1 capsule (18 mcg total) into inhaler  and inhale daily.  90 capsule  3  . cholecalciferol (VITAMIN D) 1000 UNITS tablet Take 1,000 Units by mouth daily.      . clopidogrel (PLAVIX) 75 MG tablet Take 75 mg by mouth daily.        . finasteride (PROSCAR) 5 MG tablet Take 5 mg by mouth daily.      . metolazone (ZAROXOLYN) 2.5 MG tablet Take 2.5 mg by mouth daily as needed (for swelling).       . nitroGLYCERIN (NITROSTAT) 0.4 MG SL tablet Place 1 tablet (0.4 mg total) under the tongue every 5 (five) minutes as needed.  25 tablet  3    Current medications: Current Facility-Administered Medications  Medication Dose Route Frequency Provider Last Rate Last Dose  . 0.9 %  sodium chloride infusion   Intravenous Continuous Conrad Pewamo, MD      . 0.9 %  sodium chloride infusion   Intravenous Continuous Conrad [place], MD      . 0.9 %  sodium chloride infusion   Intravenous Continuous Conrad [place], MD 75 mL/hr at 01/06/14 1039    . acetaminophen (TYLENOL) tablet 325-650 mg  325-650 mg Oral Q4H PRN Laurie Panda, MD       Or  . acetaminophen (TYLENOL) solution 325-650 mg  325-650 mg Oral Q4H PRN  Laurie Panda, MD      . fentaNYL (SUBLIMAZE) 0.05 MG/ML injection           . fentaNYL (SUBLIMAZE) injection 25-50 mcg  25-50 mcg Intravenous Q5 min PRN Laurie Panda, MD   50 mcg at 01/06/14 1134  . ondansetron (ZOFRAN) injection 4 mg  4 mg Intravenous Once PRN Laurie Panda, MD      . oxyCODONE (Oxy IR/ROXICODONE) 5 MG immediate release tablet              Allergies  Allergen Reactions  . Ace Inhibitors     REACTION: cough  . Penicillins Other (See Comments)    "childhood allergy"  . Tussionex Pennkinetic Er [Hydrocod Polst-Cpm Polst Er] Other (See Comments)    "caused prostate problems"     Past Medical History  Diagnosis Date  . Myocardial infarction   . Hypertension   . CHF (congestive heart failure)   . COPD (chronic obstructive pulmonary disease)   . GERD (gastroesophageal reflux disease)   . AAA (abdominal aortic aneurysm)   . Tremor   . Dysrhythmia     ICD-defibrillator  . Carotid artery stenosis   . Peripheral vascular disease   . Adenomatous colon polyp 01/2004  . Diverticulosis   . AVM (arteriovenous malformation)   . CAD (coronary artery disease)   . HLD (hyperlipidemia)   . Hypothyroidism   . BPH (benign prostatic hypertrophy)   . Fatigue   . Automatic implantable cardioverter-defibrillator in situ   . Sleep apnea     cpap 5 yrs  . H/O hiatal hernia   . Anemia     hx  . Complication of anesthesia     claustrophobic, unabe to lie on back more than 4 hours at time due to back  . Diabetes mellitus     DIET CONTROLLED    Past Surgical History  Procedure Laterality Date  . Coronary artery bypass graft    . Cardiac catheterization    . Coronary angioplasty    . Renal artery stent    . Angioplasty      BILATERAL  LE  W/STENTS  . Endarterectomy  01/04/2012  Procedure: ENDARTERECTOMY CAROTID;left  Surgeon: Hinda Lenis, MD;  Location: [place];  Service: Vascular;  Laterality: Left;  with patch angioplasty  . Carotid endarterectomy Left 01/04/12  .  Cardiac defibrillator placement    . Iron infusion  June 16, 2012    Family History  Problem Relation Age of Onset  . Colon cancer Sister   . Diabetes Sister   . Heart disease Sister   . Heart disease Brother   . Lung cancer Mother   . Cancer Mother   . Heart attack Maternal Grandmother   . Colon polyps Sister     Social History:  reports that he quit smoking about 11 years ago. His smoking use included Cigarettes. He has a 60 pack-year smoking history. He has never used smokeless tobacco. He reports that he does not drink alcohol or use illicit drugs.   Review of Systems: Constitutional:  denies fever, chills, diaphoresis, appetite change and fatigue.  HEENT: denies photophobia, eye pain, redness, hearing loss, ear pain, congestion, sore throat, rhinorrhea, sneezing, neck pain, neck stiffness and tinnitus.  Respiratory: denies SOB, DOE, cough, chest tightness, and wheezing.  Cardiovascular: denies chest pain, palpitations and leg swelling.  Gastrointestinal: denies nausea, vomiting, abdominal pain, diarrhea, constipation, blood in stool.  Genitourinary: denies dysuria, urgency, frequency, hematuria, flank pain and difficulty urinating.  Musculoskeletal: denies  myalgias, back pain, joint swelling, arthralgias and gait problem.   Skin: denies pallor, rash and wound.  Neurological: denies dizziness, seizures, syncope, weakness, light-headedness, numbness and headaches.   Hematological: denies adenopathy, easy bruising, personal or family bleeding history.  Psychiatric/ Behavioral: denies suicidal ideation, mood changes, confusion, nervousness, sleep disturbance and agitation.    Physical Exam: BP 121/52  Pulse 58  Temp(Src) 97.9 F (36.6 C) (Oral)  Resp 21  SpO2 95%  General: Vital signs reviewed and noted. Well-developed, well-nourished, in no acute distress; alert,   Head: Normocephalic, atraumatic, sclera anicteric,   Neck: Supple. Negative for carotid bruits. No JVD     Lungs:  Clear bilaterally, no  wheezes, rales, or rhonchi. Breathing is normal   Heart: RRR with S1 S2. No murmurs, rubs, or gallops   Abdomen:  Soft, non-tender, non-distended with normoactive bowel sounds. No hepatomegaly. No rebound/guarding. No obvious abdominal masses   MSK: Strength and the appear normal for age.   Extremities: No clubbing or cyanosis. No edema.  Distal pedal pulses are 2+ and equal bilaterally.  Neurologic: Alert and oriented X 3. Moves all extremities spontaneously.  Psych: Responds to questions appropriately with a normal affect.     Lab results: Basic Metabolic Panel:  Recent Labs Lab 01/01/14 0912  NA 139  K 4.7  CL 103  CO2 22  GLUCOSE 136*  BUN 25*  CREATININE 0.77  CALCIUM 8.8    Liver Function Tests:  Recent Labs Lab 01/01/14 0912  AST 26  ALT 27  ALKPHOS 68  BILITOT 0.4  PROT 6.9  ALBUMIN 3.5   No results found for this basename: LIPASE, AMYLASE,  in the last 168 hours No results found for this basename: AMMONIA,  in the last 168 hours  CBC:  Recent Labs Lab 01/01/14 0912  WBC 8.6  HGB 13.3  HCT 41.2  MCV 93.6  PLT 142*    Cardiac Enzymes: No results found for this basename: CKTOTAL, CKMB, CKMBINDEX, TROPONINI,  in the last 168 hours  BNP: No components found with this basename: POCBNP,   CBG:  Recent Labs Lab 01/06/14 0605 01/06/14 1015  GLUCAP 124* 121*    Coagulation Studies: No results found for this basename: LABPROT, INR,  in the last 72 hours   Other results:  EKG :  No ECG ordered after surgery.  Tele:  Sinus brady at 59.    Imaging:  No results found.     Assessment & Plan:  1. Bradycardia:  He reportedly had bradycardia during the induction of anesthesia.   There are no notes describing the event.  There are no tele strips.  The ICD is programmed with  a back up pacing rate of 40.   He has a slow HR at baseline and is on chronic coreg and amiodarone.  He is asymptomatic.  At this  point, I would continue current meds.   If he was significant episodes of symptomatic bradycardia requiring pacing , Dr. Debara Pickett can adjust his meds   There is no indication of any complications during the surgery or post op.  He is completely asymptomatic.  Will order a post op ECG.  2. Hx of CAD:  No signis or symptoms to suggest that he is ischemic.  No further evaluation needed at this point.   3.  Chronic systolic CHF:  Stable.  Very comfortable lying flat.   Continue current meds.   Will sign off.  Call for questions.    Thayer Headings, Brooke Bonito., MD, [facility] 01/06/2014, 12:37 PM Office - [phone] Pager [phone]

## 2014-01-06 NOTE — Anesthesia Procedure Notes (Signed)
Procedure Name: Intubation Date/Time: 01/06/2014 8:01 AM Performed by: Trixie Deis A Pre-anesthesia Checklist: Patient identified, Emergency Drugs available, Patient being monitored, Suction available and Timeout performed Patient Re-evaluated:Patient Re-evaluated prior to inductionOxygen Delivery Method: Circle system utilized Preoxygenation: Pre-oxygenation with 100% oxygen Intubation Type: IV induction Ventilation: Mask ventilation without difficulty and Oral airway inserted - appropriate to patient size Laryngoscope Size: Mac and 4 Grade View: Grade II Tube type: Oral Tube size: 7.5 mm Number of attempts: 1 Airway Equipment and Method: Bougie stylet Placement Confirmation: ETT inserted through vocal cords under direct vision,  positive ETCO2 and breath sounds checked- equal and bilateral Secured at: 23 cm Tube secured with: Tape Dental Injury: Teeth and Oropharynx as per pre-operative assessment  Difficulty Due To: Difficult Airway- due to large tongue

## 2014-01-06 NOTE — Addendum Note (Signed)
Addendum created 01/06/14 1134 by Leonor Liv, CRNA   Modules edited: Anesthesia Medication Administration

## 2014-01-07 ENCOUNTER — Encounter (HOSPITAL_COMMUNITY): Payer: Self-pay | Admitting: *Deleted

## 2014-01-07 ENCOUNTER — Other Ambulatory Visit: Payer: Self-pay | Admitting: *Deleted

## 2014-01-07 DIAGNOSIS — I714 Abdominal aortic aneurysm, without rupture, unspecified: Secondary | ICD-10-CM

## 2014-01-07 DIAGNOSIS — Z48812 Encounter for surgical aftercare following surgery on the circulatory system: Secondary | ICD-10-CM

## 2014-01-07 LAB — BASIC METABOLIC PANEL
BUN: 20 mg/dL (ref 6–23)
CO2: 25 mEq/L (ref 19–32)
Calcium: 8.3 mg/dL — ABNORMAL LOW (ref 8.4–10.5)
Chloride: 105 mEq/L (ref 96–112)
Creatinine, Ser: 0.78 mg/dL (ref 0.50–1.35)
GFR calc Af Amer: >90 mL/min (ref >90)
GFR calc non Af Amer: >90 mL/min (ref >90)
Glucose, Bld: 117 mg/dL — ABNORMAL HIGH (ref 70–99)
Potassium: 4.3 mEq/L (ref 3.7–5.3)
Sodium: 142 mEq/L (ref 137–147)

## 2014-01-07 LAB — CBC
HCT: 37.8 % — ABNORMAL LOW (ref 39.0–52.0)
Hemoglobin: 12.3 g/dL — ABNORMAL LOW (ref 13.0–17.0)
MCH: 30.8 pg (ref 26.0–34.0)
MCHC: 32.5 g/dL (ref 30.0–36.0)
MCV: 94.5 fL (ref 78.0–100.0)
Platelets: 149 10*3/uL — ABNORMAL LOW (ref 150–400)
RBC: 4 MIL/uL — ABNORMAL LOW (ref 4.22–5.81)
RDW: 14 % (ref 11.5–15.5)
WBC: 9.8 10*3/uL (ref 4.0–10.5)

## 2014-01-07 MED ORDER — OXYCODONE HCL 5 MG PO TABS: 5.0000 mg | ORAL_TABLET | Freq: Four times a day (QID) | ORAL | Status: DC | PRN

## 2014-01-07 NOTE — Progress Notes (Addendum)
[facility] Progress Note  01/07/2014 8:02 AM 1 Day Post-Op  Subjective:  No complaints-Dr. Bridgett Larsson has seen pt and said he can go home.  Tm 99.3 now afebrile HR 50's-60's 093'A-355'D systolic 32% RA  Filed Vitals:   01/07/14 0747  BP:   Pulse:   Temp: 98.6 F (37 C)  Resp:       CBC    Component Value Date/Time   WBC 9.8 01/07/2014 0400   WBC 5.9 10/03/2013 1036   RBC 4.00* 01/07/2014 0400   RBC 4.48 10/03/2013 1036   HGB 12.3* 01/07/2014 0400   HGB 13.8 10/03/2013 1036   HCT 37.8* 01/07/2014 0400   HCT 42.1 10/03/2013 1036   PLT 149* 01/07/2014 0400   PLT 160 10/03/2013 1036   MCV 94.5 01/07/2014 0400   MCV 94.0 10/03/2013 1036   MCH 30.8 01/07/2014 0400   MCH 30.8 10/03/2013 1036   MCHC 32.5 01/07/2014 0400   MCHC 32.8 10/03/2013 1036   RDW 14.0 01/07/2014 0400   RDW 13.2 10/03/2013 1036   LYMPHSABS 1.1 10/03/2013 1036   LYMPHSABS 1.4 08/27/2013 1543   MONOABS 0.4 10/03/2013 1036   MONOABS 0.8 08/27/2013 1543   EOSABS 0.2 10/03/2013 1036   EOSABS 0.3 08/27/2013 1543   BASOSABS 0.0 10/03/2013 1036   BASOSABS 0.0 08/27/2013 1543    BMET    Component Value Date/Time   NA 142 01/07/2014 0400   NA 139 10/03/2013 1036   K 4.3 01/07/2014 0400   K 5.1 10/03/2013 1036   CL 105 01/07/2014 0400   CL 94* 10/02/2012 0937   CO2 25 01/07/2014 0400   CO2 28 10/03/2013 1036   GLUCOSE 117* 01/07/2014 0400   GLUCOSE 141* 10/03/2013 1036   GLUCOSE 190* 10/02/2012 0937   BUN 20 01/07/2014 0400   BUN 22.4 10/03/2013 1036   CREATININE 0.78 01/07/2014 0400   CREATININE 0.75 10/31/2013 1216   CREATININE 1.0 10/03/2013 1036   CALCIUM 8.3* 01/07/2014 0400   CALCIUM 9.9 10/03/2013 1036   GFRNONAA >90 01/07/2014 0400   GFRAA >90 01/07/2014 0400    INR    Component Value Date/Time   INR 1.07 01/01/2014 0912     Intake/Output Summary (Last 24 hours) at 01/07/14 0802 Last data filed at 01/07/14 0400  Gross per 24 hour  Intake 3021.25 ml  Output    375 ml  Net  2646.25 ml     Assessment:  68 y.o. male is s/p:  1. Bilateral common femoral artery cannulation under ultrasound exposure 2. "Preclose" repair of bilateral common femoral artery  3. Placement of catheter in aorta x 2 4. Aortogram 5. Repair of aorta with modular bifurcated prosthesis with one limb (35 mm x 14 mm x 14 cm) 6. Placement of right iliac extension limb (12 mm x 7 cm) 7. Placement of left iliac limb (12 mm x 10 cm) 8. Angioplasty of bilateral common iliac artery (8 mm x 20 mm) 9. Radiology S&I  1 Day Post-Op  Plan: -pt doing well this am -discharge home today -fu with Dr. Bridgett Larsson in 4 weeks. -DVT prophylaxis:  Lovenox   Leontine Locket, PA-C [facility] [phone] 01/07/2014 8:02 AM  Addendum  I have independently interviewed and examined the patient, and I agree with the physician assistant's findings.  No groin complications at this point.  No hematoma or PSA.  Some skin tearing from taping pannus to get exposure.  Ok to D/C with follow in one month with  CTA.  Adele Barthel, MD [facility]: [phone] Pager: [phone]  01/07/2014, 8:10 AM

## 2014-01-07 NOTE — Progress Notes (Signed)
Utilization review completed.  

## 2014-01-07 NOTE — Discharge Summary (Addendum)
[facility] Discharge Summary  [name] 29-Oct-1946 68 y.o. male  [id number]  Admission Date: 01/06/2014  Discharge Date: 01/07/14  Physician: Conrad Escobares, MD  Admission Diagnosis: AAA   HPI:   This is a 68 y.o. male who presents with chief complaint: follow up for AAA. Previous studies demonstrate an AAA, measuring 5.0 cm. Recent CTA demonstrates increase in size to 5.7 cm. The patient does not have back or abdominal pain. The patient is a former smoker.  The patient's PMH, PSH, SH, FamHx, Med, and Allergies are unchanged from 10/31/13.  On ROS today: no back pain, no leg pain  Hospital Course:  The patient was admitted to the hospital and taken to the operating room on 01/06/2014 and underwent: EVAR.   The pt tolerated the procedure well and was transported to the PACU in good condition. By POD 1, he is doing well and is discharged home.  He did have a cardiology consult for bradycardia on induction of anesthesia.  He has a slow HR at baseline and is on chronic coreg and amiodarone and is asymptomatic.  At this point, it is recommended to continue current medications.  If he was to have a significant episode of symptomatic bradycardia requiring pacing, Dr. Debara Pickett can adjust his medications.  Also, with his chronic CHF, he is very comfortable lying flat and is to continue on his current medications.  The remainder of the hospital course consisted of increasing mobilization and increasing intake of solids without difficulty.  CBC    Component Value Date/Time   WBC 9.8 01/07/2014 0400   WBC 5.9 10/03/2013 1036   RBC 4.00* 01/07/2014 0400   RBC 4.48 10/03/2013 1036   HGB 12.3* 01/07/2014 0400   HGB 13.8 10/03/2013 1036   HCT 37.8* 01/07/2014 0400   HCT 42.1 10/03/2013 1036   PLT 149* 01/07/2014 0400   PLT 160 10/03/2013 1036   MCV 94.5 01/07/2014 0400   MCV 94.0 10/03/2013 1036   MCH 30.8 01/07/2014 0400   MCH 30.8 10/03/2013 1036   MCHC 32.5  01/07/2014 0400   MCHC 32.8 10/03/2013 1036   RDW 14.0 01/07/2014 0400   RDW 13.2 10/03/2013 1036   LYMPHSABS 1.1 10/03/2013 1036   LYMPHSABS 1.4 08/27/2013 1543   MONOABS 0.4 10/03/2013 1036   MONOABS 0.8 08/27/2013 1543   EOSABS 0.2 10/03/2013 1036   EOSABS 0.3 08/27/2013 1543   BASOSABS 0.0 10/03/2013 1036   BASOSABS 0.0 08/27/2013 1543    BMET    Component Value Date/Time   NA 142 01/07/2014 0400   NA 139 10/03/2013 1036   K 4.3 01/07/2014 0400   K 5.1 10/03/2013 1036   CL 105 01/07/2014 0400   CL 94* 10/02/2012 0937   CO2 25 01/07/2014 0400   CO2 28 10/03/2013 1036   GLUCOSE 117* 01/07/2014 0400   GLUCOSE 141* 10/03/2013 1036   GLUCOSE 190* 10/02/2012 0937   BUN 20 01/07/2014 0400   BUN 22.4 10/03/2013 1036   CREATININE 0.78 01/07/2014 0400   CREATININE 0.75 10/31/2013 1216   CREATININE 1.0 10/03/2013 1036   CALCIUM 8.3* 01/07/2014 0400   CALCIUM 9.9 10/03/2013 1036   GFRNONAA >90 01/07/2014 0400   GFRAA >90 01/07/2014 0400     Discharge Instructions:   The patient is discharged to home with extensive instructions on wound care and progressive ambulation.  They are instructed not to drive or perform any heavy lifting until returning to see the physician in his office.  Discharge Orders   Future Appointments Provider Department Dept Phone   02/24/2014 10:30 AM Pixie Casino, MD [facility] [phone]   08/04/2014 2:30 PM [facility] [phone]   08/04/2014 3:00 PM [name], MD [facility] [phone]   10/27/2014 8:45 AM Hayden Pedro, MD [facility] [phone]   Future Orders Complete By Expires   ABDOMINAL PROCEDURE/ANEURYSM REPAIR/AORTO-BIFEMORAL BYPASS:  Call MD for increased abdominal pain; cramping diarrhea; nausea/vomiting  As directed    Call MD for:  redness, tenderness, or signs of infection (pain, swelling, bleeding, redness, odor or  green/yellow discharge around incision site)  As directed    Call MD for:  severe or increased pain, loss or decreased feeling  in affected limb(s)  As directed    Call MD for:  temperature >100.5  As directed    Driving Restrictions  As directed    Comments:     No driving for 2 weeks and while taking pain medication   Lifting restrictions  As directed    Comments:     No lifting for 4 weeks   Resume previous diet  As directed       Discharge Diagnosis:  AAA  Secondary Diagnosis: Patient Active Problem List   Diagnosis Date Noted  . AAA (abdominal aortic aneurysm) without rupture 01/06/2014  . URI (upper respiratory infection) 12/20/2013  . Cardiomyopathy, ischemic 11/24/2013  . History of acute inferior wall MI 11/24/2013  . S/P CABG x 2 11/24/2013  . PAD (peripheral artery disease) 11/24/2013  . Carotid stenosis 10/31/2013  . Hyperlipidemia LDL goal < 70 06/18/2013  . Cough 03/13/2013  . Acute bronchitis 03/13/2013  . Rhinitis 07/21/2012  . Blood in stool 07/19/2012  . Dyspnea 04/18/2012  . Iron deficiency anemia 04/18/2012  . OSA on CPAP 04/18/2012  . Edema 04/18/2012  . Unspecified hypothyroidism 04/18/2012  . Leaking abdominal aortic aneurysm 12/29/2011  . Carotid artery stenosis and occlusion 12/29/2011  . COPD, moderately severe 10/14/2010  . HYPERTENSION 11/22/2009  . HYPERTROPHY PROSTATE W/UR OBST & OTH LUTS 11/22/2009  . HYPERGLYCEMIA, BORDERLINE 11/22/2009  . BACK PAIN 11/08/2009  . CAD 03/23/2009  . GERD 03/23/2009   Past Medical History  Diagnosis Date  . Myocardial infarction   . Hypertension   . CHF (congestive heart failure)   . COPD (chronic obstructive pulmonary disease)   . GERD (gastroesophageal reflux disease)   . AAA (abdominal aortic aneurysm)   . Tremor   . Dysrhythmia     ICD-defibrillator  . Carotid artery stenosis   . Peripheral vascular disease   . Adenomatous colon polyp 01/2004  . Diverticulosis   . AVM (arteriovenous  malformation)   . CAD (coronary artery disease)   . HLD (hyperlipidemia)   . Hypothyroidism   . BPH (benign prostatic hypertrophy)   . Fatigue   . Automatic implantable cardioverter-defibrillator in situ   . Sleep apnea     cpap 5 yrs  . H/O hiatal hernia   . Anemia     hx  . Complication of anesthesia     claustrophobic, unabe to lie on back more than 4 hours at time due to back  . Diabetes mellitus     DIET CONTROLLED       Medication List         albuterol 108 (90 BASE) MCG/ACT inhaler  Commonly known as:  PROVENTIL HFA;VENTOLIN HFA  Inhale 2 puffs into the lungs every 6 (six) hours as needed for wheezing or shortness of breath.     aspirin 81 MG tablet  Take 81 mg by mouth daily.     atorvastatin 40 MG tablet  Commonly known as:  LIPITOR  Take 1 tablet (40 mg total) by mouth daily.     benzonatate 200 MG capsule  Commonly known as:  TESSALON  Take 1 capsule (200 mg total) by mouth 2 (two) times daily as needed for cough.     carvedilol 25 MG tablet  Commonly known as:  COREG  Take 1 tablet (25 mg total) by mouth 2 (two) times daily with a meal.     cholecalciferol 1000 UNITS tablet  Commonly known as:  VITAMIN D  Take 1,000 Units by mouth daily.     clopidogrel 75 MG tablet  Commonly known as:  PLAVIX  Take 75 mg by mouth daily.     CORDARONE 200 MG tablet  Generic drug:  amiodarone  Take 200 mg by mouth daily.     finasteride 5 MG tablet  Commonly known as:  PROSCAR  Take 5 mg by mouth daily.     fish oil-omega-3 fatty acids 1000 MG capsule  Take 1 g by mouth daily.     furosemide 80 MG tablet  Commonly known as:  LASIX  Take 1 tablet (80 mg total) by mouth 2 (two) times daily.     isosorbide mononitrate 60 MG 24 hr tablet  Commonly known as:  IMDUR  Take 30-60 mg by mouth 2 (two) times daily. Take 1 tablet every am and 0.5 tablet at bedtime     levothyroxine 50 MCG tablet  Commonly known as:  SYNTHROID, LEVOTHROID  TAKE 1 TABLET (50 MCG  TOTAL) BY MOUTH DAILY.     metolazone 2.5 MG tablet  Commonly known as:  ZAROXOLYN  Take 2.5 mg by mouth daily as needed (for swelling).     multivitamin-lutein Caps capsule  Take 1 capsule by mouth daily.     MENS 50+ MULTI VITAMIN/MIN Tabs  Take by mouth daily.     nitroGLYCERIN 0.4 MG SL tablet  Commonly known as:  NITROSTAT  Place 1 tablet (0.4 mg total) under the tongue every 5 (five) minutes as needed.     oxyCODONE 5 MG immediate release tablet  Commonly known as:  ROXICODONE  Take 1 tablet (5 mg total) by mouth every 6 (six) hours as needed for severe pain.     ramipril 2.5 MG capsule  Commonly known as:  ALTACE  Take 2.5-5 mg by mouth 2 (two) times daily. Take 2 capsules (5mg ) in AM. Take 1 capsule (2.5mg ) in PM     spironolactone 25 MG tablet  Commonly known as:  ALDACTONE  Take 1 tablet (25 mg total) by mouth daily.     tiotropium 18 MCG inhalation capsule  Commonly known as:  SPIRIVA HANDIHALER  Place 1 capsule (18 mcg total) into inhaler and inhale daily.        Roxicodone #30 No Refill  Disposition: home  Patient's condition: is Good  Follow up: 1. Dr. Bridgett Larsson in 4 weeks with CTA   Leontine Locket, PA-C [facility] [phone] 01/07/2014  8:12 AM   - For VQI Registry use --- Instructions: Press F2 to tab through selections.  Delete question if not applicable.   Post-op:  Time to Extubation: [ x] In OR, [ ]  < 12 hrs, [ ]  12-24  hrs, [ ]  >=24 hrs Vasopressors Req. Post-op: No MI: no, [ ]  Troponin only, [ ]  EKG or Clinical New Arrhythmia: No CHF: No ICU Stay: 1day in stepdown Transfusion: No  If yes, n/a units given  Complications: Resp failure: no, [ ]  Pneumonia, [ ]  Ventilator Chg in renal function: no, [ ]  Inc. Cr > 0.5, [ ]  Temp. Dialysis, [ ]  Permanent dialysis Leg ischemia: no, no Surgery needed, [ ]  Yes, Surgery needed, [ ]  Amputation Bowel ischemia: no, [ ]  Medical Rx, [ ]  Surgical Rx Wound complication: no, [ ]   Superficial separation/infection, [ ]  Return to OR Return to OR: No  Return to OR for bleeding: No Stroke: no, [ ]  Minor, [ ]  Major  Discharge medications: Statin use:  Yes If No: [ ]  For Medical reasons, [ ]  Non-compliant, [ ]  Not-indicated ASA use:  Yes  If No: [ ]  For Medical reasons, [ ]  Non-compliant, [ ]  Not-indicated Plavix use:  Yes If No: [ ]  For Medical reasons, [ ]  Non-compliant, [ ]  Not-indicated Beta blocker use:  Yes If No: [ ]  For Medical reasons, [ ]  Non-compliant, [ ]  Not-indicated   Addendum  I have independently interviewed and examined the patient, and I agree with the physician assistant's discharge summary.  This pt had a large AAA which was managed with a percutaneous EVAR.  His bilateral CIA stents had to be reangioplastied due to his multiple grafts overlapping this segment.  The patient has had a completely asx post-op despite intra-op bradycardia.  He will follow up in the office in 4 weeks with CTA abd/pelvis.  Adele Barthel, MD [facility] [place] Office: [phone] Pager: [phone]  01/07/2014, 10:12 AM

## 2014-01-08 ENCOUNTER — Encounter (HOSPITAL_COMMUNITY): Payer: Self-pay | Admitting: Vascular Surgery

## 2014-01-08 LAB — TYPE AND SCREEN
ABO/RH(D): A POS
Antibody Screen: POSITIVE
DAT, IgG: NEGATIVE
Donor AG Type: NEGATIVE
Donor AG Type: NEGATIVE
Unit division: 0
Unit division: 0

## 2014-01-09 LAB — TYPE AND SCREEN
ABO/RH(D): A POS
Antibody Screen: POSITIVE

## 2014-01-15 ENCOUNTER — Other Ambulatory Visit: Payer: Self-pay | Admitting: Internal Medicine

## 2014-01-15 ENCOUNTER — Other Ambulatory Visit: Payer: Self-pay | Admitting: Cardiovascular Disease

## 2014-01-21 ENCOUNTER — Other Ambulatory Visit: Payer: Self-pay | Admitting: Cardiovascular Disease

## 2014-01-21 ENCOUNTER — Other Ambulatory Visit: Payer: Self-pay | Admitting: Internal Medicine

## 2014-01-21 NOTE — Telephone Encounter (Signed)
Rx was sent to pharmacy electronically. 

## 2014-01-22 ENCOUNTER — Other Ambulatory Visit: Payer: Self-pay

## 2014-01-22 DIAGNOSIS — R0600 Dyspnea, unspecified: Secondary | ICD-10-CM

## 2014-01-22 DIAGNOSIS — J449 Chronic obstructive pulmonary disease, unspecified: Secondary | ICD-10-CM

## 2014-01-22 MED ORDER — TIOTROPIUM BROMIDE MONOHYDRATE 18 MCG IN CAPS: 18.0000 ug | ORAL_CAPSULE | Freq: Every day | RESPIRATORY_TRACT | Status: DC

## 2014-01-22 NOTE — Telephone Encounter (Signed)
Requesting spiriva rx.  Refill sent.  Nothing further needed.

## 2014-02-12 ENCOUNTER — Encounter: Payer: Self-pay | Admitting: Vascular Surgery

## 2014-02-13 ENCOUNTER — Ambulatory Visit (INDEPENDENT_AMBULATORY_CARE_PROVIDER_SITE_OTHER): Payer: Medicare Other | Admitting: Vascular Surgery

## 2014-02-13 ENCOUNTER — Encounter: Payer: Self-pay | Admitting: Vascular Surgery

## 2014-02-13 ENCOUNTER — Ambulatory Visit
Admission: RE | Admit: 2014-02-13 | Discharge: 2014-02-13 | Disposition: A | Discharge status: Home / Self Care | Payer: Medicare Other | Source: Ambulatory Visit | Attending: Vascular Surgery | Admitting: Vascular Surgery

## 2014-02-13 VITALS — BP 131/48 | HR 63 | Resp 18 | Ht 68.0 in | Wt 267.2 lb

## 2014-02-13 DIAGNOSIS — Z48812 Encounter for surgical aftercare following surgery on the circulatory system: Secondary | ICD-10-CM

## 2014-02-13 DIAGNOSIS — I714 Abdominal aortic aneurysm, without rupture, unspecified: Secondary | ICD-10-CM

## 2014-02-13 MED ORDER — IOHEXOL 350 MG/ML SOLN
80.0000 mL | Freq: Once | INTRAVENOUS | Status: AC | PRN
  Administered 2014-02-13: 80 mL via INTRAVENOUS

## 2014-02-13 NOTE — Progress Notes (Signed)
    Post-operative EVAR   History of Present Illness  [name] is a 68 y.o. male who presents post-op s/p EVAR (Date: 01/08/14).  Most recent CTA (Date: 02/13/2014 ) demonstrates: no endoleak and stable sac size.  The patient has no had back or abdominal pain.  Past Medical History, Past Surgical History, Social History, Family History, Medications, Allergies, and Review of Systems are unchanged from previous evaluation on 01/08/14.  For VQI Use Only  PRE-ADM LIVING: Home  AMB STATUS: Ambulatory  Physical Examination  Filed Vitals:   02/13/14 1306  BP: 131/48  Pulse: 63  Resp: 18    Vascular: Vessel Right Left  Aorta Non-palpable [name]  Femoral  Palpable  Palpable  Popliteal  Non-palpable  Non-palpable  PT Faintly Palpable Faintly Palpable  DP Palpable Palpable   Gastrointestinal: soft, NTND, -G/R, - HSM, - masses, - CVAT B, no palpable aorta, bilateral groin punctures well healed  Non-Invasive Vascular Imaging  CTA Abd & pelvis (Date: 02/13/2014)  Status post aortic stent graft repair of the abdominal aortic aneurysm. Stable native aneurysm sac diameter.   Negative for endoleak or acute finding.  Based on my review of the CTA, the endograft is patent and in good position without any evidence of migration.  There is no endoleak and no evidence of limb dysfunction.  Medical Decision Making  [name] is a 68 y.o. male who presents s/p EVAR.  Pt is asymptomatic with stable sac size.  I do not expect the AAA sac size to change 1 month post-op.  I discussed with the patient the importance of surveillance of the endograft.  The next endograft duplex will be scheduled for 6 months.  The next CT will be scheduled for 12 months.  The patient will follow up with [place] in 6 months with these studies.  Thank you for allowing [place] to participate in this patient's care.  Adele Barthel, MD [facility] [place] Office: [phone] Pager:  [phone]  02/13/2014, 1:28 PM

## 2014-02-16 DIAGNOSIS — N478 Other disorders of prepuce: Secondary | ICD-10-CM | POA: Diagnosis not present

## 2014-02-16 DIAGNOSIS — N401 Enlarged prostate with lower urinary tract symptoms: Secondary | ICD-10-CM | POA: Diagnosis not present

## 2014-02-16 DIAGNOSIS — N139 Obstructive and reflux uropathy, unspecified: Secondary | ICD-10-CM | POA: Diagnosis not present

## 2014-02-16 DIAGNOSIS — N471 Phimosis: Secondary | ICD-10-CM | POA: Diagnosis not present

## 2014-02-16 NOTE — Addendum Note (Signed)
Addended by: Thresa Ross C on: 02/16/2014 01:58 PM   Modules accepted: Orders

## 2014-02-18 ENCOUNTER — Encounter: Payer: Self-pay | Admitting: *Deleted

## 2014-02-19 ENCOUNTER — Ambulatory Visit: Payer: Medicare Other | Admitting: Internal Medicine

## 2014-02-23 ENCOUNTER — Ambulatory Visit (INDEPENDENT_AMBULATORY_CARE_PROVIDER_SITE_OTHER): Payer: Medicare Other | Admitting: Internal Medicine

## 2014-02-23 ENCOUNTER — Encounter: Payer: Self-pay | Admitting: Internal Medicine

## 2014-02-23 VITALS — BP 118/58 | HR 62 | Temp 98.6°F | Wt 267.1 lb

## 2014-02-23 DIAGNOSIS — J441 Chronic obstructive pulmonary disease with (acute) exacerbation: Secondary | ICD-10-CM

## 2014-02-23 MED ORDER — SULFAMETHOXAZOLE-TMP DS 800-160 MG PO TABS: 1.0000 | ORAL_TABLET | Freq: Two times a day (BID) | ORAL | Status: DC

## 2014-02-23 MED ORDER — PREDNISONE 20 MG PO TABS: 20.0000 mg | ORAL_TABLET | Freq: Two times a day (BID) | ORAL | Status: DC

## 2014-02-23 NOTE — Progress Notes (Signed)
   Subjective:    Patient ID: [name], male    DOB: Dec 06, 1946, 68 y.o.   MRN: [id number]  HPI Began having cough and sore throat Thursday March 12 that has worsened.  Had had fever sporadically but has not checked with thermometer.Cough productive of brown, green, gray sputum. No sinus pain.- but does have brown, green, gray nasal discharge. Has pain in ribs when he coughs. Not taking any OTC medicines.  Smoked for 25 years 1-2 ppd. Quit 14-15 years ago. No longer has sore throat.  Review of Systems  Constitutional: Positive for fever and fatigue. Negative for chills.  HENT: Positive for congestion. Negative for sinus pressure. Rhinorrhea: yellow, brown, gray.   Eyes: Negative for discharge and itching.  Respiratory: Positive for cough and wheezing. Shortness of breath: at rest and with exertion.   Cardiovascular: Chest pain: when coughing.  Musculoskeletal: Negative for arthralgias.  Allergic/Immunologic: Negative for environmental allergies.  Psychiatric/Behavioral: Sleep disturbance: because of coughing and drainage.   Has COPD and has bronchitis frequently.    Objective:   Physical Exam  Constitutional: He is oriented to person, place, and time. He appears well-developed and well-nourished.  Noticeably short of breath   HENT:  Head: Normocephalic and atraumatic.  Mouth/Throat: No oropharyngeal exudate.  Unable to visualize TM due to excessive wax.  Eyes: Pupils are equal, round, and reactive to light. Left eye exhibits no discharge. No scleral icterus.  Neck: Normal range of motion. Neck supple.  Cardiovascular: Normal rate, regular rhythm and normal heart sounds.   Pulmonary/Chest:  Scattered rhonchi and wheezing noted throughout lung fields-worse in LLL.  Lymphadenopathy:    He has no cervical adenopathy.  Neurological: He is alert and oriented to person, place, and time.  Skin: Skin is warm and dry.  Psychiatric: He has a normal mood and affect. His behavior is  normal.          Assessment & Plan:

## 2014-02-23 NOTE — Progress Notes (Signed)
Pre visit review using our clinic review tool, if applicable. No additional management support is needed unless otherwise documented below in the visit note. 

## 2014-02-23 NOTE — Patient Instructions (Signed)
   Discuss the ramipril with your cardiologist; your allergy list mentions intolerance (cough)  to ACE inhibitors, a class of drug which includes ramipril.Perhaps he may prescribe an agent in the ARB class.

## 2014-02-23 NOTE — Progress Notes (Signed)
   Subjective:    Patient ID: [name], male    DOB: [birth year], 68 y.o.   MRN: [id number]  HPI  Began having cough and sore throat Thursday March 12 that has worsened. Had had fever sporadically but has not checked with thermometer.Cough productive of brown, green, gray sputum. No sinus pain.- but does have brown, green, gray nasal discharge. Has pain in ribs when he coughs. Not taking any OTC medicines. Smoked for 25 years 1-2 ppd. Quit 14-15 years ago. No longer has sore throat.  Has COPD and has bronchitis frequently.   Review of Systems Constitutional: Positive for fever and fatigue. Negative for chills.  HENT: Positive for congestion. Negative for sinus pressure. Rhinorrhea: yellow, brown, gray.  Eyes: Negative for discharge and itching.  Respiratory: Positive for cough and wheezing. Shortness of breath: at rest and with exertion.  Cardiovascular: Chest pain: when coughing.  Musculoskeletal: Negative for arthralgias.  Allergic/Immunologic: Negative for environmental allergies.  Psychiatric/Behavioral: Sleep disturbance: because of coughing and drainage.         Objective:   Physical Exam  Physical Exam  Constitutional: He appears adequately nourished. Morbidly obese Noticeably short of breath  HENT:  Head: Normocephalic and atraumatic.  Mouth/Throat: No oropharyngeal exudate.  TMs dull .  Eyes: Pupils are equal, round, and reactive to light. Left eye exhibits no discharge. No scleral icterus.  Neck: Normal range of motion. Neck supple.  Cardiovascular: Normal rate,intermittent irregular rhythm and normal heart sounds.  Pulmonary/Chest:  Scattered rhonchi and wheezing noted throughout lung fields-worse in RLL & upper airway Lymphadenopathy:  He has no cervical  Or axillaryadenopathy.  Neurological: He is alert and oriented to person, place, and time.  Skin: Skin is warm and dry.  Psychiatric: He has a normal mood and affect. His behavior is normal.          Assessment & Plan:  #1 COPD exacerbation  #2 history of intolerance to ACE inhibitors due to cough; he is on ramipril  #3 triple diuretic therapy as per cardiology  Plan: See orders

## 2014-02-24 ENCOUNTER — Encounter: Payer: Self-pay | Admitting: Internal Medicine

## 2014-02-24 ENCOUNTER — Encounter (HOSPITAL_COMMUNITY): Payer: Self-pay | Admitting: Emergency Medicine

## 2014-02-24 ENCOUNTER — Ambulatory Visit (INDEPENDENT_AMBULATORY_CARE_PROVIDER_SITE_OTHER): Payer: Medicare Other | Admitting: Internal Medicine

## 2014-02-24 ENCOUNTER — Ambulatory Visit (INDEPENDENT_AMBULATORY_CARE_PROVIDER_SITE_OTHER): Payer: Medicare Other | Admitting: *Deleted

## 2014-02-24 ENCOUNTER — Emergency Department (HOSPITAL_COMMUNITY): Payer: Medicare Other

## 2014-02-24 ENCOUNTER — Inpatient Hospital Stay (HOSPITAL_COMMUNITY)
Admission: EM | Admit: 2014-02-24 | Discharge: 2014-03-03 | DRG: 291 | Disposition: A | Discharge status: Home / Self Care | Payer: Medicare Other | Attending: Internal Medicine | Admitting: Internal Medicine

## 2014-02-24 ENCOUNTER — Telehealth: Payer: Self-pay | Admitting: Internal Medicine

## 2014-02-24 VITALS — BP 108/70 | HR 64 | Ht 68.0 in | Wt 267.0 lb

## 2014-02-24 DIAGNOSIS — K219 Gastro-esophageal reflux disease without esophagitis: Secondary | ICD-10-CM | POA: Diagnosis present

## 2014-02-24 DIAGNOSIS — R05 Cough: Secondary | ICD-10-CM

## 2014-02-24 DIAGNOSIS — K921 Melena: Secondary | ICD-10-CM

## 2014-02-24 DIAGNOSIS — I2589 Other forms of chronic ischemic heart disease: Secondary | ICD-10-CM | POA: Diagnosis not present

## 2014-02-24 DIAGNOSIS — G4733 Obstructive sleep apnea (adult) (pediatric): Secondary | ICD-10-CM | POA: Diagnosis present

## 2014-02-24 DIAGNOSIS — I25810 Atherosclerosis of coronary artery bypass graft(s) without angina pectoris: Secondary | ICD-10-CM | POA: Diagnosis present

## 2014-02-24 DIAGNOSIS — Z95810 Presence of automatic (implantable) cardiac defibrillator: Secondary | ICD-10-CM

## 2014-02-24 DIAGNOSIS — D509 Iron deficiency anemia, unspecified: Secondary | ICD-10-CM

## 2014-02-24 DIAGNOSIS — I714 Abdominal aortic aneurysm, without rupture, unspecified: Secondary | ICD-10-CM

## 2014-02-24 DIAGNOSIS — Z48812 Encounter for surgical aftercare following surgery on the circulatory system: Secondary | ICD-10-CM

## 2014-02-24 DIAGNOSIS — I10 Essential (primary) hypertension: Secondary | ICD-10-CM | POA: Diagnosis not present

## 2014-02-24 DIAGNOSIS — I739 Peripheral vascular disease, unspecified: Secondary | ICD-10-CM | POA: Diagnosis present

## 2014-02-24 DIAGNOSIS — R0989 Other specified symptoms and signs involving the circulatory and respiratory systems: Secondary | ICD-10-CM | POA: Diagnosis not present

## 2014-02-24 DIAGNOSIS — N401 Enlarged prostate with lower urinary tract symptoms: Secondary | ICD-10-CM

## 2014-02-24 DIAGNOSIS — R059 Cough, unspecified: Secondary | ICD-10-CM | POA: Diagnosis not present

## 2014-02-24 DIAGNOSIS — Z801 Family history of malignant neoplasm of trachea, bronchus and lung: Secondary | ICD-10-CM

## 2014-02-24 DIAGNOSIS — Z951 Presence of aortocoronary bypass graft: Secondary | ICD-10-CM

## 2014-02-24 DIAGNOSIS — I5023 Acute on chronic systolic (congestive) heart failure: Secondary | ICD-10-CM | POA: Diagnosis not present

## 2014-02-24 DIAGNOSIS — N189 Chronic kidney disease, unspecified: Secondary | ICD-10-CM | POA: Diagnosis present

## 2014-02-24 DIAGNOSIS — J9600 Acute respiratory failure, unspecified whether with hypoxia or hypercapnia: Secondary | ICD-10-CM | POA: Diagnosis not present

## 2014-02-24 DIAGNOSIS — N400 Enlarged prostate without lower urinary tract symptoms: Secondary | ICD-10-CM | POA: Diagnosis present

## 2014-02-24 DIAGNOSIS — J209 Acute bronchitis, unspecified: Secondary | ICD-10-CM | POA: Diagnosis not present

## 2014-02-24 DIAGNOSIS — I6529 Occlusion and stenosis of unspecified carotid artery: Secondary | ICD-10-CM

## 2014-02-24 DIAGNOSIS — R0602 Shortness of breath: Secondary | ICD-10-CM | POA: Diagnosis not present

## 2014-02-24 DIAGNOSIS — I5043 Acute on chronic combined systolic (congestive) and diastolic (congestive) heart failure: Secondary | ICD-10-CM | POA: Diagnosis not present

## 2014-02-24 DIAGNOSIS — I255 Ischemic cardiomyopathy: Secondary | ICD-10-CM | POA: Diagnosis present

## 2014-02-24 DIAGNOSIS — J069 Acute upper respiratory infection, unspecified: Secondary | ICD-10-CM

## 2014-02-24 DIAGNOSIS — E119 Type 2 diabetes mellitus without complications: Secondary | ICD-10-CM | POA: Diagnosis present

## 2014-02-24 DIAGNOSIS — E785 Hyperlipidemia, unspecified: Secondary | ICD-10-CM | POA: Diagnosis present

## 2014-02-24 DIAGNOSIS — I2510 Atherosclerotic heart disease of native coronary artery without angina pectoris: Secondary | ICD-10-CM | POA: Diagnosis present

## 2014-02-24 DIAGNOSIS — Z8249 Family history of ischemic heart disease and other diseases of the circulatory system: Secondary | ICD-10-CM

## 2014-02-24 DIAGNOSIS — Z91199 Patient's noncompliance with other medical treatment and regimen due to unspecified reason: Secondary | ICD-10-CM

## 2014-02-24 DIAGNOSIS — I2789 Other specified pulmonary heart diseases: Secondary | ICD-10-CM | POA: Diagnosis present

## 2014-02-24 DIAGNOSIS — I509 Heart failure, unspecified: Secondary | ICD-10-CM | POA: Diagnosis not present

## 2014-02-24 DIAGNOSIS — Z9989 Dependence on other enabling machines and devices: Secondary | ICD-10-CM

## 2014-02-24 DIAGNOSIS — J440 Chronic obstructive pulmonary disease with acute lower respiratory infection: Secondary | ICD-10-CM | POA: Diagnosis not present

## 2014-02-24 DIAGNOSIS — R0902 Hypoxemia: Secondary | ICD-10-CM | POA: Diagnosis not present

## 2014-02-24 DIAGNOSIS — Z6837 Body mass index (BMI) 37.0-37.9, adult: Secondary | ICD-10-CM | POA: Diagnosis not present

## 2014-02-24 DIAGNOSIS — I129 Hypertensive chronic kidney disease with stage 1 through stage 4 chronic kidney disease, or unspecified chronic kidney disease: Secondary | ICD-10-CM | POA: Diagnosis present

## 2014-02-24 DIAGNOSIS — J449 Chronic obstructive pulmonary disease, unspecified: Secondary | ICD-10-CM | POA: Diagnosis not present

## 2014-02-24 DIAGNOSIS — E039 Hypothyroidism, unspecified: Secondary | ICD-10-CM

## 2014-02-24 DIAGNOSIS — J4489 Other specified chronic obstructive pulmonary disease: Secondary | ICD-10-CM

## 2014-02-24 DIAGNOSIS — J9819 Other pulmonary collapse: Secondary | ICD-10-CM | POA: Diagnosis not present

## 2014-02-24 DIAGNOSIS — I369 Nonrheumatic tricuspid valve disorder, unspecified: Secondary | ICD-10-CM | POA: Diagnosis not present

## 2014-02-24 DIAGNOSIS — Z833 Family history of diabetes mellitus: Secondary | ICD-10-CM

## 2014-02-24 DIAGNOSIS — N138 Other obstructive and reflux uropathy: Secondary | ICD-10-CM

## 2014-02-24 DIAGNOSIS — J40 Bronchitis, not specified as acute or chronic: Secondary | ICD-10-CM

## 2014-02-24 DIAGNOSIS — I447 Left bundle-branch block, unspecified: Secondary | ICD-10-CM | POA: Diagnosis present

## 2014-02-24 DIAGNOSIS — I5189 Other ill-defined heart diseases: Secondary | ICD-10-CM | POA: Diagnosis present

## 2014-02-24 DIAGNOSIS — J310 Chronic rhinitis: Secondary | ICD-10-CM

## 2014-02-24 DIAGNOSIS — J811 Chronic pulmonary edema: Secondary | ICD-10-CM | POA: Diagnosis not present

## 2014-02-24 DIAGNOSIS — Z800 Family history of malignant neoplasm of digestive organs: Secondary | ICD-10-CM | POA: Diagnosis not present

## 2014-02-24 DIAGNOSIS — J441 Chronic obstructive pulmonary disease with (acute) exacerbation: Secondary | ICD-10-CM | POA: Diagnosis not present

## 2014-02-24 DIAGNOSIS — M7989 Other specified soft tissue disorders: Secondary | ICD-10-CM | POA: Diagnosis not present

## 2014-02-24 DIAGNOSIS — Z9119 Patient's noncompliance with other medical treatment and regimen: Secondary | ICD-10-CM

## 2014-02-24 DIAGNOSIS — N179 Acute kidney failure, unspecified: Secondary | ICD-10-CM | POA: Diagnosis present

## 2014-02-24 DIAGNOSIS — I252 Old myocardial infarction: Secondary | ICD-10-CM

## 2014-02-24 DIAGNOSIS — I5042 Chronic combined systolic (congestive) and diastolic (congestive) heart failure: Secondary | ICD-10-CM | POA: Diagnosis present

## 2014-02-24 DIAGNOSIS — Z7982 Long term (current) use of aspirin: Secondary | ICD-10-CM

## 2014-02-24 DIAGNOSIS — J9620 Acute and chronic respiratory failure, unspecified whether with hypoxia or hypercapnia: Secondary | ICD-10-CM | POA: Diagnosis present

## 2014-02-24 DIAGNOSIS — R0600 Dyspnea, unspecified: Secondary | ICD-10-CM

## 2014-02-24 DIAGNOSIS — N289 Disorder of kidney and ureter, unspecified: Secondary | ICD-10-CM | POA: Diagnosis present

## 2014-02-24 DIAGNOSIS — Z880 Allergy status to penicillin: Secondary | ICD-10-CM

## 2014-02-24 DIAGNOSIS — Z79899 Other long term (current) drug therapy: Secondary | ICD-10-CM

## 2014-02-24 DIAGNOSIS — M549 Dorsalgia, unspecified: Secondary | ICD-10-CM

## 2014-02-24 DIAGNOSIS — R0609 Other forms of dyspnea: Secondary | ICD-10-CM | POA: Diagnosis not present

## 2014-02-24 DIAGNOSIS — R609 Edema, unspecified: Secondary | ICD-10-CM

## 2014-02-24 DIAGNOSIS — Z888 Allergy status to other drugs, medicaments and biological substances status: Secondary | ICD-10-CM

## 2014-02-24 DIAGNOSIS — R7309 Other abnormal glucose: Secondary | ICD-10-CM

## 2014-02-24 DIAGNOSIS — Z87891 Personal history of nicotine dependence: Secondary | ICD-10-CM

## 2014-02-24 LAB — URINALYSIS, ROUTINE W REFLEX MICROSCOPIC
Bilirubin Urine: NEGATIVE
Glucose, UA: 100 mg/dL — AB
Hgb urine dipstick: NEGATIVE
Ketones, ur: NEGATIVE mg/dL
Leukocytes, UA: NEGATIVE
Nitrite: NEGATIVE
Protein, ur: NEGATIVE mg/dL
Specific Gravity, Urine: 1.011 (ref 1.005–1.030)
Urobilinogen, UA: 1 mg/dL (ref 0.0–1.0)
pH: 6 (ref 5.0–8.0)

## 2014-02-24 LAB — CBC WITH DIFFERENTIAL/PLATELET
Basophils Absolute: 0 10*3/uL (ref 0.0–0.1)
Basophils Relative: 0 % (ref 0–1)
Eosinophils Absolute: 0 10*3/uL (ref 0.0–0.7)
Eosinophils Relative: 0 % (ref 0–5)
HCT: 37.7 % — ABNORMAL LOW (ref 39.0–52.0)
Hemoglobin: 12.2 g/dL — ABNORMAL LOW (ref 13.0–17.0)
Lymphocytes Relative: 9 % — ABNORMAL LOW (ref 12–46)
Lymphs Abs: 0.5 10*3/uL — ABNORMAL LOW (ref 0.7–4.0)
MCH: 29.6 pg (ref 26.0–34.0)
MCHC: 32.4 g/dL (ref 30.0–36.0)
MCV: 91.5 fL (ref 78.0–100.0)
Monocytes Absolute: 0.2 10*3/uL (ref 0.1–1.0)
Monocytes Relative: 5 % (ref 3–12)
Neutro Abs: 4.1 10*3/uL (ref 1.7–7.7)
Neutrophils Relative %: 85 % — ABNORMAL HIGH (ref 43–77)
Platelets: 134 10*3/uL — ABNORMAL LOW (ref 150–400)
RBC: 4.12 MIL/uL — ABNORMAL LOW (ref 4.22–5.81)
RDW: 13.9 % (ref 11.5–15.5)
WBC: 4.8 10*3/uL (ref 4.0–10.5)

## 2014-02-24 LAB — MDC_IDC_ENUM_SESS_TYPE_INCLINIC
Battery Remaining Longevity: 9.5
Brady Statistic RV Percent Paced: 1 % — CL
HighPow Impedance: 45 Ohm
Implantable Pulse Generator Serial Number: 265569
Lead Channel Impedance Value: 535 Ohm
Lead Channel Pacing Threshold Amplitude: 2.5 V
Lead Channel Pacing Threshold Pulse Width: 0.8 ms
Lead Channel Sensing Intrinsic Amplitude: 10.6 mV
Lead Channel Setting Pacing Amplitude: 4.5 V
Lead Channel Setting Pacing Pulse Width: 0.4 ms
Lead Channel Setting Sensing Sensitivity: 0.5 mV
Zone Setting Detection Interval: 272.7 ms
Zone Setting Detection Interval: 315.7 ms
Zone Setting Detection Interval: 375 ms

## 2014-02-24 LAB — PACEMAKER DEVICE OBSERVATION

## 2014-02-24 LAB — COMPREHENSIVE METABOLIC PANEL
ALT: 21 U/L (ref 0–53)
AST: 23 U/L (ref 0–37)
Albumin: 3.3 g/dL — ABNORMAL LOW (ref 3.5–5.2)
Alkaline Phosphatase: 70 U/L (ref 39–117)
BUN: 19 mg/dL (ref 6–23)
CO2: 24 mEq/L (ref 19–32)
Calcium: 9 mg/dL (ref 8.4–10.5)
Chloride: 104 mEq/L (ref 96–112)
Creatinine, Ser: 0.71 mg/dL (ref 0.50–1.35)
GFR calc Af Amer: >90 mL/min (ref >90)
GFR calc non Af Amer: >90 mL/min (ref >90)
Glucose, Bld: 154 mg/dL — ABNORMAL HIGH (ref 70–99)
Potassium: 4.1 mEq/L (ref 3.7–5.3)
Sodium: 142 mEq/L (ref 137–147)
Total Bilirubin: 0.4 mg/dL (ref 0.3–1.2)
Total Protein: 6.7 g/dL (ref 6.0–8.3)

## 2014-02-24 LAB — I-STAT TROPONIN, ED: Troponin i, poc: 0.01 ng/mL (ref 0.00–0.08)

## 2014-02-24 LAB — I-STAT ARTERIAL BLOOD GAS, ED
Bicarbonate: 25.1 mEq/L — ABNORMAL HIGH (ref 20.0–24.0)
O2 Saturation: 94 %
Patient temperature: 98.6
TCO2: 26 mmol/L (ref 0–100)
pCO2 arterial: 40.4 mmHg (ref 35.0–45.0)
pH, Arterial: 7.401 (ref 7.350–7.450)
pO2, Arterial: 71 mmHg — ABNORMAL LOW (ref 80.0–100.0)

## 2014-02-24 LAB — PRO B NATRIURETIC PEPTIDE
Pro B Natriuretic peptide (BNP): 762 pg/mL — ABNORMAL HIGH (ref 0–125)
Pro B Natriuretic peptide (BNP): 912.7 pg/mL — ABNORMAL HIGH (ref 0–125)

## 2014-02-24 LAB — GLUCOSE, CAPILLARY
Glucose-Capillary: 122 mg/dL — ABNORMAL HIGH (ref 70–99)
Glucose-Capillary: 136 mg/dL — ABNORMAL HIGH (ref 70–99)

## 2014-02-24 LAB — TROPONIN I: Troponin I: <0.3 ng/mL (ref <0.30)

## 2014-02-24 MED ORDER — SPIRONOLACTONE 25 MG PO TABS
25.0000 mg | ORAL_TABLET | Freq: Every day | ORAL | Status: DC
  Administered 2014-02-25 – 2014-03-03 (×7): 25 mg via ORAL
  Filled 2014-02-24 (×7): qty 1

## 2014-02-24 MED ORDER — ISOSORBIDE MONONITRATE ER 30 MG PO TB24
30.0000 mg | ORAL_TABLET | Freq: Every day | ORAL | Status: DC
  Administered 2014-02-24 – 2014-02-27 (×4): 30 mg via ORAL
  Filled 2014-02-24 (×5): qty 1

## 2014-02-24 MED ORDER — METOLAZONE 2.5 MG PO TABS
2.5000 mg | ORAL_TABLET | Freq: Every day | ORAL | Status: DC | PRN
  Filled 2014-02-24: qty 1

## 2014-02-24 MED ORDER — LEVOFLOXACIN IN D5W 500 MG/100ML IV SOLN
500.0000 mg | INTRAVENOUS | Status: DC
  Filled 2014-02-24: qty 100

## 2014-02-24 MED ORDER — ALUM & MAG HYDROXIDE-SIMETH 200-200-20 MG/5ML PO SUSP
15.0000 mL | Freq: Four times a day (QID) | ORAL | Status: DC | PRN
  Administered 2014-02-28 – 2014-03-02 (×3): 15 mL via ORAL
  Filled 2014-02-24 (×4): qty 30

## 2014-02-24 MED ORDER — LEVOFLOXACIN IN D5W 500 MG/100ML IV SOLN
500.0000 mg | INTRAVENOUS | Status: DC
  Administered 2014-02-24 – 2014-02-25 (×2): 500 mg via INTRAVENOUS
  Filled 2014-02-24 (×3): qty 100

## 2014-02-24 MED ORDER — ASPIRIN EC 81 MG PO TBEC
81.0000 mg | DELAYED_RELEASE_TABLET | Freq: Every day | ORAL | Status: DC
  Administered 2014-02-25 – 2014-03-03 (×7): 81 mg via ORAL
  Filled 2014-02-24 (×7): qty 1

## 2014-02-24 MED ORDER — LORAZEPAM 0.5 MG PO TABS: 0.5000 mg | ORAL_TABLET | Freq: Every evening | ORAL | Status: DC | PRN

## 2014-02-24 MED ORDER — FUROSEMIDE 10 MG/ML IJ SOLN
40.0000 mg | Freq: Once | INTRAMUSCULAR | Status: AC
  Administered 2014-02-24: 40 mg via INTRAVENOUS
  Filled 2014-02-24: qty 4

## 2014-02-24 MED ORDER — RAMIPRIL 2.5 MG PO CAPS: 2.5000 mg | ORAL_CAPSULE | Freq: Two times a day (BID) | ORAL | Status: DC

## 2014-02-24 MED ORDER — ISOSORBIDE MONONITRATE ER 30 MG PO TB24: 30.0000 mg | ORAL_TABLET | Freq: Two times a day (BID) | ORAL | Status: DC

## 2014-02-24 MED ORDER — ONDANSETRON HCL 4 MG/2ML IJ SOLN: 4.0000 mg | Freq: Three times a day (TID) | INTRAMUSCULAR | Status: DC | PRN

## 2014-02-24 MED ORDER — SODIUM CHLORIDE 0.9 % IJ SOLN
3.0000 mL | Freq: Two times a day (BID) | INTRAMUSCULAR | Status: DC
  Administered 2014-02-24 – 2014-03-03 (×14): 3 mL via INTRAVENOUS

## 2014-02-24 MED ORDER — AMIODARONE HCL 200 MG PO TABS
200.0000 mg | ORAL_TABLET | Freq: Every day | ORAL | Status: DC
  Administered 2014-02-25 – 2014-03-03 (×7): 200 mg via ORAL
  Filled 2014-02-24 (×8): qty 1

## 2014-02-24 MED ORDER — ATORVASTATIN CALCIUM 40 MG PO TABS
40.0000 mg | ORAL_TABLET | Freq: Every day | ORAL | Status: DC
  Administered 2014-02-25 – 2014-03-03 (×7): 40 mg via ORAL
  Filled 2014-02-24 (×7): qty 1

## 2014-02-24 MED ORDER — RAMIPRIL 2.5 MG PO CAPS
2.5000 mg | ORAL_CAPSULE | Freq: Every evening | ORAL | Status: DC
  Administered 2014-02-24 – 2014-02-25 (×2): 2.5 mg via ORAL
  Filled 2014-02-24 (×3): qty 1

## 2014-02-24 MED ORDER — OMEGA-3-ACID ETHYL ESTERS 1 G PO CAPS
1.0000 g | ORAL_CAPSULE | Freq: Every day | ORAL | Status: DC
  Administered 2014-02-25 – 2014-03-03 (×7): 1 g via ORAL
  Filled 2014-02-24 (×7): qty 1

## 2014-02-24 MED ORDER — FUROSEMIDE 10 MG/ML IJ SOLN
80.0000 mg | Freq: Once | INTRAMUSCULAR | Status: AC
  Administered 2014-02-24: 80 mg via INTRAVENOUS
  Filled 2014-02-24: qty 8

## 2014-02-24 MED ORDER — LEVOTHYROXINE SODIUM 50 MCG PO TABS
50.0000 ug | ORAL_TABLET | Freq: Every day | ORAL | Status: DC
  Administered 2014-02-25 – 2014-03-03 (×7): 50 ug via ORAL
  Filled 2014-02-24 (×8): qty 1

## 2014-02-24 MED ORDER — POTASSIUM CHLORIDE CRYS ER 20 MEQ PO TBCR: 40.0000 meq | EXTENDED_RELEASE_TABLET | Freq: Every day | ORAL | Status: DC

## 2014-02-24 MED ORDER — IPRATROPIUM-ALBUTEROL 0.5-2.5 (3) MG/3ML IN SOLN
3.0000 mL | RESPIRATORY_TRACT | Status: DC
  Administered 2014-02-24: 3 mL via RESPIRATORY_TRACT
  Filled 2014-02-24: qty 3

## 2014-02-24 MED ORDER — RAMIPRIL 5 MG PO CAPS
5.0000 mg | ORAL_CAPSULE | Freq: Every morning | ORAL | Status: DC
  Administered 2014-02-25: 5 mg via ORAL
  Filled 2014-02-24 (×2): qty 1

## 2014-02-24 MED ORDER — CARVEDILOL 25 MG PO TABS
25.0000 mg | ORAL_TABLET | Freq: Two times a day (BID) | ORAL | Status: DC
  Administered 2014-02-25 – 2014-03-03 (×13): 25 mg via ORAL
  Filled 2014-02-24 (×15): qty 1

## 2014-02-24 MED ORDER — IPRATROPIUM-ALBUTEROL 0.5-2.5 (3) MG/3ML IN SOLN
3.0000 mL | Freq: Two times a day (BID) | RESPIRATORY_TRACT | Status: DC
  Administered 2014-02-25 – 2014-03-03 (×12): 3 mL via RESPIRATORY_TRACT
  Filled 2014-02-24 (×12): qty 3

## 2014-02-24 MED ORDER — NITROGLYCERIN 0.4 MG SL SUBL: 0.4000 mg | SUBLINGUAL_TABLET | SUBLINGUAL | Status: DC | PRN

## 2014-02-24 MED ORDER — LORAZEPAM 2 MG/ML IJ SOLN
0.5000 mg | Freq: Four times a day (QID) | INTRAMUSCULAR | Status: DC | PRN
  Administered 2014-02-24: 0.5 mg via INTRAVENOUS
  Filled 2014-02-24: qty 1

## 2014-02-24 MED ORDER — POTASSIUM CHLORIDE CRYS ER 20 MEQ PO TBCR
40.0000 meq | EXTENDED_RELEASE_TABLET | Freq: Two times a day (BID) | ORAL | Status: DC
  Administered 2014-02-25 – 2014-03-01 (×9): 40 meq via ORAL
  Filled 2014-02-24 (×10): qty 2

## 2014-02-24 MED ORDER — ALBUTEROL SULFATE (2.5 MG/3ML) 0.083% IN NEBU
2.5000 mg | INHALATION_SOLUTION | RESPIRATORY_TRACT | Status: DC | PRN
Start: 2014-02-24 — End: 2014-03-03

## 2014-02-24 MED ORDER — SODIUM CHLORIDE 0.9 % IJ SOLN: 3.0000 mL | INTRAMUSCULAR | Status: DC | PRN

## 2014-02-24 MED ORDER — CLOPIDOGREL BISULFATE 75 MG PO TABS
75.0000 mg | ORAL_TABLET | Freq: Every day | ORAL | Status: DC
  Administered 2014-02-25 – 2014-03-03 (×7): 75 mg via ORAL
  Filled 2014-02-24 (×7): qty 1

## 2014-02-24 MED ORDER — FUROSEMIDE 10 MG/ML IJ SOLN: 60.0000 mg | Freq: Two times a day (BID) | INTRAMUSCULAR | Status: DC

## 2014-02-24 MED ORDER — FUROSEMIDE 10 MG/ML IJ SOLN
80.0000 mg | Freq: Two times a day (BID) | INTRAMUSCULAR | Status: DC
  Administered 2014-02-25 – 2014-02-26 (×3): 80 mg via INTRAVENOUS
  Filled 2014-02-24 (×5): qty 8

## 2014-02-24 MED ORDER — LEVOFLOXACIN IN D5W 500 MG/100ML IV SOLN: 500.0000 mg | INTRAVENOUS | Status: DC

## 2014-02-24 MED ORDER — ENOXAPARIN SODIUM 40 MG/0.4ML ~~LOC~~ SOLN
40.0000 mg | SUBCUTANEOUS | Status: DC
  Administered 2014-02-24 – 2014-03-02 (×7): 40 mg via SUBCUTANEOUS
  Filled 2014-02-24 (×8): qty 0.4

## 2014-02-24 MED ORDER — SODIUM CHLORIDE 0.9 % IV SOLN: 250.0000 mL | INTRAVENOUS | Status: DC | PRN

## 2014-02-24 MED ORDER — FINASTERIDE 5 MG PO TABS
5.0000 mg | ORAL_TABLET | Freq: Every day | ORAL | Status: DC
  Administered 2014-02-24 – 2014-03-03 (×8): 5 mg via ORAL
  Filled 2014-02-24 (×8): qty 1

## 2014-02-24 MED ORDER — ISOSORBIDE MONONITRATE ER 60 MG PO TB24
60.0000 mg | ORAL_TABLET | Freq: Every morning | ORAL | Status: DC
  Administered 2014-02-25 – 2014-02-28 (×4): 60 mg via ORAL
  Filled 2014-02-24 (×4): qty 1

## 2014-02-24 NOTE — Progress Notes (Signed)
Called by bedside RN for a "second set of eyes" on patient who had just been admitted from the ED. Patient is resting in bed on arrival to floor, receiving breathing treatment by RT. Breath sounds diminished in all lobes, slightly SOB. HR 78, RR 20. Patient received lasix in the ED and has an additional dose scheduled for 2200. Patient is alert, oriented, skin is warm and dry. Per bedside RN patient became increasingly SOB earlier while talking with wife and appeared to be anxious. Advised bedside RN to speak with MD about medication to help control anxiety and decrease episodes of increased SOB. Will continue to monitor, advised bedside RN to call with further needs.

## 2014-02-24 NOTE — ED Notes (Signed)
Pt was sent from cardiologist office with possible chf or pneumonia.  Pt was told he had bronchitis yesterday.  Pt reports leg swelling and shortness of breath.

## 2014-02-24 NOTE — ED Notes (Signed)
Admitting MD at bedside.

## 2014-02-24 NOTE — ED Notes (Signed)
Pt ambulatory without assistance, gait steady. SpO2 dropped to 91%RA. Pt placed back on 2L [place] and is seated. Pt with marked DOE.

## 2014-02-24 NOTE — Progress Notes (Signed)
Report given to receiving RN. Patient assisted in bed and placed in sitting position with pillows to elevate for comfort. No verbal complaints. Patient continues to appear SOB at rest. O2 bumped to 3L for comfort.

## 2014-02-24 NOTE — Patient Instructions (Signed)
Please go to [place] ED.

## 2014-02-24 NOTE — ED Notes (Signed)
Attempted report 

## 2014-02-24 NOTE — Progress Notes (Signed)
Updated report given to receiving RN.

## 2014-02-24 NOTE — ED Provider Notes (Signed)
CSN: [id number]     Arrival date & time 02/24/14  1154 History   First MD Initiated Contact with Patient 02/24/14 1405     Chief Complaint  Patient presents with  . Shortness of Breath  . Leg Swelling     (Consider location/radiation/quality/duration/timing/severity/associated sxs/prior Treatment) The history is provided by the patient. No language interpreter was used.  [name] is a 68 y/o M with PMHx of myocardial infarction, hypertension, congestive heart, COPD, AAA repair in 01/08/2014, PVD, coronary artery disease, diabetes, hyperlipidemia, history of CABG in 1986, cardiac cath in 2008 the noted occlusion of both vein grafts, angioplasty in 2005, endarterectomy in 2013 presenting to the ED with increased shortness of breath and cough. As per wife's report, stated that patient was seen and assessed by his primary care provider yesterday and was diagnosed with bronchitis-this is the second time within 30 days patient has been diagnosed with bronchitis. Patient was seen and assessed by his cardiologist this morning, Dr. Debara Pickett who reported that patient has been having troubling with breathing and suspicion to be exacerbation of congestive heart failure. Patient was placed on CPAP machine before coming to the ED secondary to issues with breathing. Patient reported that he does not have oxygen at home. Reported that he has shortness of breath even while at rest-report he has difficulty breathing. Stated that he has been unable to sleep at night secondary to feeling so uncomfortable. Stated increased weight gain of approximately 17 pounds since January 2015. Stated that he has been having intermittent sweats and hot flashes over the past couple of days. Reported cough with productive phlegm of green-yellow sputum. Denied nausea, vomiting, diarrhea, melena, Tesio, dizziness, blurred vision, chest pain, headache. PCP Dr. Ronnald Ramp Cardiology Dr. Duwayne Heck  Past Medical History  Diagnosis Date  .  Myocardial infarction   . Hypertension   . CHF (congestive heart failure)   . COPD (chronic obstructive pulmonary disease)   . GERD (gastroesophageal reflux disease)   . AAA (abdominal aortic aneurysm)     followed by Dr. Bridgett Larsson  . Tremor   . Dysrhythmia     ICD-defibrillator  . Carotid artery stenosis     LCEA - Dr. Bridgett Larsson in 2013  . Peripheral vascular disease     LCEA, L renal artery stent, bilat iliac stents, R SFA stenosis  . Adenomatous colon polyp 01/2004  . Diverticulosis   . AVM (arteriovenous malformation)   . CAD (coronary artery disease)     s/p CABGx2 in 1996  . HLD (hyperlipidemia)   . Hypothyroidism   . BPH (benign prostatic hypertrophy)   . Fatigue   . Automatic implantable cardioverter-defibrillator in situ   . OSA on CPAP     AHI durign total sleep 14.69/hr, during REM 50.91/hr  . H/O hiatal hernia   . Anemia     hx  . Complication of anesthesia     claustrophobic, unabe to lie on back more than 4 hours at time due to back  . Diabetes mellitus     DIET CONTROLLED  . Ischemic cardiomyopathy    Past Surgical History  Procedure Laterality Date  . Coronary artery bypass graft  11/04/1985    x2 - PDA and sequential DX-OM (Dr. Redmond Pulling)  . Cardiac catheterization  09/16/2007    occlusion of both vein grafts, no significant LAD disease or diagonal disease, Cfx collaterals from the left, 70% in-stent restenosis of L renal artery, normal L main, RCA occluded ostially (Dr. Adora Fridge)  .  Coronary angioplasty  01/08/2004    cutting balloon atherectomy & percutaneous intervention of RCIA in-stent restenosis (Dr. Marella Chimes)  . Renal artery stent Left 03/24/2004    6x13mm Genesis stent (Dr. Marella Chimes)  . Angioplasty      BILATERAL  LE  W/STENTS  . Endarterectomy  01/04/2012    Procedure: ENDARTERECTOMY CAROTID;left  Surgeon: Hinda Lenis, MD;  Location: [facility];  Service: Vascular;  Laterality: Left;  with patch angioplasty  . Carotid endarterectomy Left 01/04/12  .  Cardiac defibrillator placement  06/2005    Guidant Vitality HE - ischemic cardiomyopathy - Dr. Marella Chimes  . Iron infusion  June 16, 2012  . Abdominal aortic endovascular stent graft [name] 01/06/2014    Procedure: ABDOMINAL AORTIC ENDOVASCULAR STENT GRAFT- GORE; ULTRASOUND GUIDED;  Surgeon: Conrad Coeburn, MD;  Location: [facility];  Service: Vascular;  Laterality: [name];  . Polypectomy    . Iliac artery stent Bilateral 03/1997    and L SFA PTA  . Icd generator change  05/02/2010    [employer]  . Transthoracic echocardiogram  12/2012    EF 30-35; LV mod to severely dilated, mod concentric hypertrophy, severe hypokinesis of inferolateral myocardium, moderate hypokineis of anteroseptal region, grade 1 diastolic dysfunction; mod MR; LA mod-severely dialted; RV mod dialted; RA mildly dilated; PA peak pressure 12mmHg  . Nm myocar perf wall motion  09/2012    lexiscan myoview - mod-severe perfusion defect r/t infarct or scar w/mild periinfarct ishcemia in basal inferior, mid inferior, apical inferior, basal inferolateral & mid inferoalteral regions - EF 21% low risk scan  . Cardiac catheterization  10/03/2002    SVG sequentially to OM1 & OM2 totally occluded at ostium, SVG to PDA totally occluded within previously placed prox vein graft stent, smal distal AAA, bialt iliac stents with 30% left end-stent restenosis and 50% right end-stent restnosis(Dr. Gerrie Nordmann)  . Cardiac catheterization  06/11/1998    L main with 20% narrowing in distal 1/3; LAD with 1st diagonla having 70% ostial narrowing, 2nd diagonal with 40% narrowing in prox third, LIMA & RIMA widely patent; in-stent restenosis of RCIA with successful PTA and new prox SVTRCA stent for residual disease (Dr. Marella Chimes)   Family History  Problem Relation Age of Onset  . Colon cancer Sister   . Diabetes Sister   . Heart disease Sister   . Heart disease Brother   . Lung cancer Mother   . Cancer Mother   . Heart attack Maternal Grandmother    . Colon polyps Sister   . Heart attack Father   . COPD Sister    History  Substance Use Topics  . Smoking status: Former Smoker -- 1.50 packs/day for 40 years    Types: Cigarettes    Quit date: 12/11/2002  . Smokeless tobacco: Never Used  . Alcohol Use: No    Review of Systems  Constitutional: Positive for fever. Negative for chills.  Respiratory: Positive for cough and shortness of breath. Negative for chest tightness.   Cardiovascular: Positive for leg swelling. Negative for chest pain.  Gastrointestinal: Negative for nausea, vomiting and abdominal pain.  Neurological: Negative for dizziness and weakness.  All other systems reviewed and are negative.      Allergies  Ace inhibitors; Penicillins; and Tussionex pennkinetic er  Home Medications   Current Outpatient Rx  Name  Route  Sig  Dispense  Refill  . albuterol (PROVENTIL HFA;VENTOLIN HFA) 108 (90 BASE) MCG/ACT inhaler   Inhalation  Inhale 2 puffs into the lungs every 6 (six) hours as needed for wheezing or shortness of breath.   1 Inhaler   3   . amiodarone (CORDARONE) 200 MG tablet   Oral   Take 200 mg by mouth daily.          Marland Kitchen aspirin 81 MG tablet   Oral   Take 81 mg by mouth daily.         Marland Kitchen atorvastatin (LIPITOR) 40 MG tablet   Oral   Take 1 tablet (40 mg total) by mouth daily.   90 tablet   3   . carvedilol (COREG) 25 MG tablet   Oral   Take 1 tablet (25 mg total) by mouth 2 (two) times daily with a meal.   180 tablet   3   . cholecalciferol (VITAMIN D) 1000 UNITS tablet   Oral   Take 1,000 Units by mouth daily.         . clopidogrel (PLAVIX) 75 MG tablet   Oral   Take 75 mg by mouth daily.           . finasteride (PROSCAR) 5 MG tablet   Oral   Take 5 mg by mouth daily.         . fish oil-omega-3 fatty acids 1000 MG capsule   Oral   Take 1 g by mouth daily.          . furosemide (LASIX) 80 MG tablet   Oral   Take 1 tablet (80 mg total) by mouth 2 (two) times daily.    180 tablet   3   . isosorbide mononitrate (IMDUR) 60 MG 24 hr tablet   Oral   Take 30-60 mg by mouth 2 (two) times daily. Take 1 tablet every am and 0.5 tablet at bedtime         . levothyroxine (SYNTHROID, LEVOTHROID) 50 MCG tablet      TAKE 1 TABLET (50 MCG TOTAL) BY MOUTH DAILY.   90 tablet   1   . metolazone (ZAROXOLYN) 2.5 MG tablet   Oral   Take 2.5 mg by mouth daily as needed (for swelling).          . Multiple Vitamins-Minerals (MENS 50+ MULTI VITAMIN/MIN) TABS   Oral   Take by mouth daily.         . multivitamin-lutein (OCUVITE-LUTEIN) CAPS   Oral   Take 1 capsule by mouth daily.           . predniSONE (DELTASONE) 20 MG tablet   Oral   Take 1 tablet (20 mg total) by mouth 2 (two) times daily.   14 tablet   0   . ramipril (ALTACE) 2.5 MG capsule   Oral   Take 2.5-5 mg by mouth 2 (two) times daily. Take 2 capsules (5mg ) in AM. Take 1 capsule (2.5mg ) in PM         . spironolactone (ALDACTONE) 25 MG tablet   Oral   Take 1 tablet (25 mg total) by mouth daily.   90 tablet   3   . sulfamethoxazole-trimethoprim (BACTRIM DS) 800-160 MG per tablet   Oral   Take 1 tablet by mouth 2 (two) times daily.   20 tablet   0   . tiotropium (SPIRIVA HANDIHALER) 18 MCG inhalation capsule   Inhalation   Place 1 capsule (18 mcg total) into inhaler and inhale daily.   90 capsule   3   . NITROSTAT 0.4 MG  SL tablet      Dissolve 1 tablet under the tongue every 5 minutes as  needed for chest pain (up  to 3 tabs in 15 min then  call 911)   25 tablet   3    BP 122/58  Pulse 64  Temp(Src) 97.6 F (36.4 C) (Oral)  Resp 23  Wt 266 lb (120.657 kg)  SpO2 93% Physical Exam  Nursing note and vitals reviewed. Constitutional: He is oriented to person, place, and time. He appears well-developed and well-nourished. No distress.  HENT:  Head: Normocephalic and atraumatic.  Mouth/Throat: Oropharynx is clear and moist. No oropharyngeal exudate.  Eyes: Conjunctivae and  EOM are normal. Pupils are equal, round, and reactive to light. Right eye exhibits no discharge. Left eye exhibits no discharge.  Neck: Normal range of motion. Neck supple.  Cardiovascular: Normal rate, regular rhythm and normal heart sounds.  Exam reveals no friction rub.   No murmur heard. Swelling identified to lower extremities bilaterally with 2+ pitting edema  Pulmonary/Chest: He exhibits no tenderness.  Breath sounds are diminished bilaterally to upper and lower lobes Increased effort  Musculoskeletal: Normal range of motion.  Neurological: He is alert and oriented to person, place, and time. No cranial nerve deficit. He exhibits normal muscle tone. Coordination normal.  Cranial nerves III-XII grossly intact Strength 5+/5+ to upper and lower extremities bilaterally with resistance applied, equal distribution noted Tremors noted to upper extremities  Skin: Skin is warm and dry. No rash noted. He is not diaphoretic. No erythema.  Psychiatric: He has a normal mood and affect. His behavior is normal. Thought content normal.    ED Course  Procedures (including critical care time)  3:19 PM Checked pulse ox with ambulation and patient had pulse ox of 91% while on room air.   3:28 PM This provider spoke with Imogene Burn, PA-C - discussed case, history, presentation, labs, imaging in great detail. Patient to be admitted under the care of Dr. Sloan Leiter to [facility] to Telemetry floor.   Results for orders placed during the hospital encounter of 02/24/14  CBC WITH DIFFERENTIAL      Result Value Ref Range   WBC 4.8  4.0 - 10.5 K/uL   RBC 4.12 (*) 4.22 - 5.81 MIL/uL   Hemoglobin 12.2 (*) 13.0 - 17.0 g/dL   HCT 37.7 (*) 39.0 - 52.0 %   MCV 91.5  78.0 - 100.0 fL   MCH 29.6  26.0 - 34.0 pg   MCHC 32.4  30.0 - 36.0 g/dL   RDW 13.9  11.5 - 15.5 %   Platelets 134 (*) 150 - 400 K/uL   Neutrophils Relative % 85 (*) 43 - 77 %   Neutro Abs 4.1  1.7 - 7.7 K/uL   Lymphocytes Relative 9  (*) 12 - 46 %   Lymphs Abs 0.5 (*) 0.7 - 4.0 K/uL   Monocytes Relative 5  3 - 12 %   Monocytes Absolute 0.2  0.1 - 1.0 K/uL   Eosinophils Relative 0  0 - 5 %   Eosinophils Absolute 0.0  0.0 - 0.7 K/uL   Basophils Relative 0  0 - 1 %   Basophils Absolute 0.0  0.0 - 0.1 K/uL  COMPREHENSIVE METABOLIC PANEL      Result Value Ref Range   Sodium 142  137 - 147 mEq/L   Potassium 4.1  3.7 - 5.3 mEq/L   Chloride 104  96 - 112 mEq/L   CO2 24  19 -  32 mEq/L   Glucose, Bld 154 (*) 70 - 99 mg/dL   BUN 19  6 - 23 mg/dL   Creatinine, Ser 0.71  0.50 - 1.35 mg/dL   Calcium 9.0  8.4 - 10.5 mg/dL   Total Protein 6.7  6.0 - 8.3 g/dL   Albumin 3.3 (*) 3.5 - 5.2 g/dL   AST 23  0 - 37 U/L   ALT 21  0 - 53 U/L   Alkaline Phosphatase 70  39 - 117 U/L   Total Bilirubin 0.4  0.3 - 1.2 mg/dL   GFR calc non Af Amer >90  >90 mL/min   GFR calc Af Amer >90  >90 mL/min  I-STAT TROPOININ, ED      Result Value Ref Range   Troponin i, poc 0.01  0.00 - 0.08 ng/mL   Comment 3             Labs Review Labs Reviewed  CBC WITH DIFFERENTIAL - Abnormal; Notable for the following:    RBC 4.12 (*)    Hemoglobin 12.2 (*)    HCT 37.7 (*)    Platelets 134 (*)    Neutrophils Relative % 85 (*)    Lymphocytes Relative 9 (*)    Lymphs Abs 0.5 (*)    All other components within normal limits  COMPREHENSIVE METABOLIC PANEL - Abnormal; Notable for the following:    Glucose, Bld 154 (*)    Albumin 3.3 (*)    All other components within normal limits  PRO B NATRIURETIC PEPTIDE  I-STAT TROPOININ, ED   Imaging Review Dg Chest 2 View  02/24/2014   CLINICAL DATA:  SHORTNESS OF BREATH LEG SWELLING  EXAM: CHEST  2 VIEW  COMPARISON:  CT CTA ABD/PEL W/CM AND/OR W/O CM dated 02/13/2014; DG CHEST 1V PORT dated 01/06/2014  FINDINGS: The cardiac silhouette is enlarged. Low lung volumes. A left pectoral as AICD with lead tip projecting in the region of the right ventricle. There is diffuse prominence of the interstitial markings, and a  mild peribronchial cuffing. No focal regions of consolidation or focal infiltrates are appreciated. The osseous structures demonstrate no evidence of acute abnormalities. Patient is status post median sternotomy and coronary artery bypass grafting.  IMPRESSION: Interstitial infiltrate consistent with pulmonary edema. No focal regions of consolidation or focal infiltrates.   Electronically Signed   By: Margaree Mackintosh M.D.   On: 02/24/2014 12:58     EKG Interpretation   Date/Time:  Tuesday February 24 2014 11:59:48 EDT Ventricular Rate:  60 PR Interval:  190 QRS Duration: 134 QT Interval:  508 QTC Calculation: 508 R Axis:   -42 Text Interpretation:  Sinus rhythm with occasional Premature ventricular  complexes and Possible Premature atrial complexes with Abberant conduction  Left axis deviation Left bundle branch block Abnormal ECG new premature  ventricular contraction Confirmed by ZACKOWSKI  MD, SCOTT ([id number]) on  02/24/2014 3:04:30 PM      MDM   Final diagnoses:  CHF exacerbation  Bronchitis  Hypoxia   Medications  furosemide (LASIX) injection 40 mg (40 mg Intravenous Given 02/24/14 1507)   Filed Vitals:   02/24/14 1209 02/24/14 1338 02/24/14 1351 02/24/14 1450  BP:  125/48 129/48 122/58  Pulse:  64    Temp:      TempSrc:      Resp:  18 19 23   Weight:      SpO2: 92% 96% 92% 93%    Patient presenting to the ED with shortness of breath, productive  cough is been ongoing for the past couple of days. Patient was seen and assessed as per care provider yesterday when he was diagnosed with bronchitis for the second time within 30 days. Patient was seen and assessed by his cardiologist this morning identified trouble in breathing and recommended that patient come to the ED to be assessed. Patient reported that he has noticed increase in weight since January 2015 of 17 pounds. Reported that he's been having bilateral leg swelling. Stated he's been having shortness of breath for the past  couple of days, worsening as days progressed. Stated that he has not able to sleep in bed- sleeps in recliner for the past 3 days. Reported that he's been taking his Lasix, reported that he has not taken his Lasix this morning. Alert and oriented. GCS 15. Heart rate and rhythm normal. Lungs with diminished breath sounds to upper and lower lobes bilaterally-poor lung expansion noted. Patient appears to have difficulty breathing, increased effort noted. Negative JVD. Cap refill less than 3 seconds. Bilateral lower extremity edema identified with 2+ pitting edema from the dorsal aspect of the feet bilaterally to just below the knee bilaterally. EKG noted normal sinus rhythm with occasional premature ventricular complexes with left avis deviation and LBBB - premature contractions are new. I-STAT troponin negative elevation. CBC negative findings-negative elevated white blood cell count identified-negative left shift or leukocytosis. CMP noted glucose of 154 - anion gap of 14.0 mEq per liter. Chest x-ray noted interstitial infiltrate consistent with pulmonary edema-negative findings for pneumonia. Upon arrival to the ED patient's pulse was 59 beats per minute with pulse ox of 88% on room air. Patient was placed on oxygen therapy via nasal cannula 2 L per minute this increased the patient's pulse ox to 96%. Patient denies having oxygen at home. Patient reports that he uses a CPAP at night. Based on presentation, imaging, vitals patient to be admitted to the ED regarding CHF exacerbation. Lasix administered in ED setting via IV. Discussed with patient plan for admission-patient agreed and understood. This provider spoke with hospitalist-patient to be admitted as inpatient to telemetry floor. Patient stable for transfer.  Jamse Mead, PA-C 02/24/14 2323

## 2014-02-24 NOTE — Progress Notes (Signed)
Pt. Was placed on CPAP auto titrate (Min: 7, max: 18) via FFM (what pt. Wears at home) with 3L O2 bled in. Pt. Is tolerating CPAP well at this time without any complications.

## 2014-02-24 NOTE — H&P (Addendum)
[facility]                                                                                    Patient Demographics  [name], is a 68 y.o. male  MRN: [id number]   DOB - 05/24/1946  Admit Date - 02/24/2014  Outpatient Primary MD for the patient is [name], MD   With History of -  Past Medical History  Diagnosis Date  . Myocardial infarction   . Hypertension   . CHF (congestive heart failure)   . COPD (chronic obstructive pulmonary disease)   . GERD (gastroesophageal reflux disease)   . AAA (abdominal aortic aneurysm)     followed by Dr. Bridgett Larsson  . Tremor   . Dysrhythmia     ICD-defibrillator  . Carotid artery stenosis     LCEA - Dr. Bridgett Larsson in 2013  . Peripheral vascular disease     LCEA, L renal artery stent, bilat iliac stents, R SFA stenosis  . Adenomatous colon polyp 01/2004  . Diverticulosis   . AVM (arteriovenous malformation)   . CAD (coronary artery disease)     s/p CABGx2 in 1996  . HLD (hyperlipidemia)   . Hypothyroidism   . BPH (benign prostatic hypertrophy)   . Fatigue   . Automatic implantable cardioverter-defibrillator in situ   . OSA on CPAP     AHI durign total sleep 14.69/hr, during REM 50.91/hr  . H/O hiatal hernia   . Anemia     hx  . Complication of anesthesia     claustrophobic, unabe to lie on back more than 4 hours at time due to back  . Diabetes mellitus     DIET CONTROLLED  . Ischemic cardiomyopathy       Past Surgical History  Procedure Laterality Date  . Coronary artery bypass graft  11/04/1985    x2 - PDA and sequential DX-OM (Dr. Redmond Pulling)  . Cardiac catheterization  09/16/2007    occlusion of both vein grafts, no significant LAD disease or diagonal disease, Cfx collaterals from the left, 70% in-stent restenosis of L renal artery, normal L main, RCA occluded ostially (Dr. Adora Fridge)  . Coronary angioplasty  01/08/2004    cutting balloon atherectomy & percutaneous intervention of RCIA in-stent restenosis (Dr. Marella Chimes)  .  Renal artery stent Left 03/24/2004    6x23mm Genesis stent (Dr. Marella Chimes)  . Angioplasty      BILATERAL  LE  W/STENTS  . Endarterectomy  01/04/2012    Procedure: ENDARTERECTOMY CAROTID;left  Surgeon: Hinda Lenis, MD;  Location: [facility];  Service: Vascular;  Laterality: Left;  with patch angioplasty  . Carotid endarterectomy Left 01/04/12  . Cardiac defibrillator placement  06/2005    Guidant Vitality HE - ischemic cardiomyopathy - Dr. Marella Chimes  . Iron infusion  June 16, 2012  . Abdominal aortic endovascular stent graft [name] 01/06/2014    Procedure: ABDOMINAL AORTIC ENDOVASCULAR STENT GRAFT- GORE; ULTRASOUND GUIDED;  Surgeon: Conrad Bingen, MD;  Location: [facility];  Service: Vascular;  Laterality: [name];  . Polypectomy    . Iliac artery stent Bilateral 03/1997    and L SFA PTA  .  Icd generator change  05/02/2010    [employer]  . Transthoracic echocardiogram  12/2012    EF 30-35; LV mod to severely dilated, mod concentric hypertrophy, severe hypokinesis of inferolateral myocardium, moderate hypokineis of anteroseptal region, grade 1 diastolic dysfunction; mod MR; LA mod-severely dialted; RV mod dialted; RA mildly dilated; PA peak pressure 56mmHg  . Nm myocar perf wall motion  09/2012    lexiscan myoview - mod-severe perfusion defect r/t infarct or scar w/mild periinfarct ishcemia in basal inferior, mid inferior, apical inferior, basal inferolateral & mid inferoalteral regions - EF 21% low risk scan  . Cardiac catheterization  10/03/2002    SVG sequentially to OM1 & OM2 totally occluded at ostium, SVG to PDA totally occluded within previously placed prox vein graft stent, smal distal AAA, bialt iliac stents with 30% left end-stent restenosis and 50% right end-stent restnosis(Dr. Gerrie Nordmann)  . Cardiac catheterization  06/11/1998    L main with 20% narrowing in distal 1/3; LAD with 1st diagonla having 70% ostial narrowing, 2nd diagonal with 40% narrowing in prox third, LIMA & RIMA  widely patent; in-stent restenosis of RCIA with successful PTA and new prox SVTRCA stent for residual disease (Dr. Marella Chimes)    in for   Chief Complaint  Patient presents with  . Shortness of Breath  . Leg Swelling     HPI  [name]  is an extremely pleasant  68 y.o. male, with a past medical history significant for congestive heart failure with an EF of 30-35% (January 2014), AICD placement, COPD, obstructive sleep apnea, AAA repair (January 2015) who presents to the emergency department complaining of shortness of breath that started last Thursday. The patient reports coughing up green sputum, he went to see his primary care physician started him on Bactrim and prednisone. His symptoms did not improve. To the contrary for the past 3 nights he has been sitting up in a chair unable to breathe. He reports compliance with a low-salt diet and taking his medications. However, he mentions that when he has an appointment he will skip his Lasix dose so that he can avoid frequent bathroom trips. He complains of musculoskeletal chest pain that he attributes to coughing. He mentions that he wears his CPAP at night, but frequently also has to wear it for 8 hours at a time during the day. He complains of recent chills, rigors, and diaphoresis.  He has gained 17 pounds since late January.   In the ED he is too short of breath to give me his history consequently, most of the history comes from his wife.  Review of Systems    In addition to the HPI above, No Headache, No changes with Vision or hearing, No problems swallowing food or Liquids, No Chest pain,  No Abdominal pain, No Nausea or Vomiting, Bowel movements are regular, No Blood in stool or Urine, No dysuria, No new skin rashes or bruises, No new joints pains-aches,  No new weakness, tingling, numbness in any extremity, No polyuria, polydypsia or polyphagia, No significant Mental Stressors.  A full 10 point Review of Systems was done,  except as stated above, all other Review of Systems were negative.   Social History History  Substance Use Topics  . Smoking status: Former Smoker -- 1.50 packs/day for 40 years    Types: Cigarettes    Quit date: 12/11/2002  . Smokeless tobacco: Never Used  . Alcohol Use: No  Retired Engineer, structural.   Family History Family History  Problem Relation Age of Onset  . Colon cancer Sister   . Diabetes Sister   . Heart disease Sister   . Heart disease Brother   . Lung cancer Mother   . Cancer Mother   . Heart attack Maternal Grandmother   . Colon polyps Sister   . Heart attack Father   . COPD Sister      Prior to Admission medications   Medication Sig Start Date End Date Taking? Authorizing Provider  albuterol (PROVENTIL HFA;VENTOLIN HFA) 108 (90 BASE) MCG/ACT inhaler Inhale 2 puffs into the lungs every 6 (six) hours as needed for wheezing or shortness of breath. 12/20/13  Yes Raquel Rey, NP  amiodarone (CORDARONE) 200 MG tablet Take 200 mg by mouth daily.    Yes Historical Provider, MD  aspirin 81 MG tablet Take 81 mg by mouth daily.   Yes Historical Provider, MD  atorvastatin (LIPITOR) 40 MG tablet Take 1 tablet (40 mg total) by mouth daily. 08/20/13  Yes Rebecca Eaton, MD  carvedilol (COREG) 25 MG tablet Take 1 tablet (25 mg total) by mouth 2 (two) times daily with a meal. 08/20/13  Yes Rebecca Eaton, MD  cholecalciferol (VITAMIN D) 1000 UNITS tablet Take 1,000 Units by mouth daily.   Yes Historical Provider, MD  clopidogrel (PLAVIX) 75 MG tablet Take 75 mg by mouth daily.     Yes Historical Provider, MD  finasteride (PROSCAR) 5 MG tablet Take 5 mg by mouth daily.   Yes Historical Provider, MD  fish oil-omega-3 fatty acids 1000 MG capsule Take 1 g by mouth daily.    Yes Historical Provider, MD  furosemide (LASIX) 80 MG tablet Take 1 tablet (80 mg total) by mouth 2 (two) times daily. 08/20/13  Yes Rebecca Eaton, MD  isosorbide mononitrate (IMDUR) 60 MG 24 hr tablet  Take 30-60 mg by mouth 2 (two) times daily. Take 1 tablet every am and 0.5 tablet at bedtime 08/20/13  Yes Rebecca Eaton, MD  levothyroxine (SYNTHROID, LEVOTHROID) 50 MCG tablet TAKE 1 TABLET (50 MCG TOTAL) BY MOUTH DAILY. 09/06/13  Yes [name], MD  metolazone (ZAROXOLYN) 2.5 MG tablet Take 2.5 mg by mouth daily as needed (for swelling).  09/24/12  Yes Historical Provider, MD  Multiple Vitamins-Minerals (MENS 50+ MULTI VITAMIN/MIN) TABS Take by mouth daily.   Yes Historical Provider, MD  multivitamin-lutein (OCUVITE-LUTEIN) CAPS Take 1 capsule by mouth daily.     Yes Historical Provider, MD  predniSONE (DELTASONE) 20 MG tablet Take 1 tablet (20 mg total) by mouth 2 (two) times daily. 02/23/14  Yes Hendricks Limes, MD  ramipril (ALTACE) 2.5 MG capsule Take 2.5-5 mg by mouth 2 (two) times daily. Take 2 capsules (5mg ) in AM. Take 1 capsule (2.5mg ) in PM 08/20/13  Yes Rebecca Eaton, MD  spironolactone (ALDACTONE) 25 MG tablet Take 1 tablet (25 mg total) by mouth daily. 08/20/13  Yes Rebecca Eaton, MD  sulfamethoxazole-trimethoprim (BACTRIM DS) 800-160 MG per tablet Take 1 tablet by mouth 2 (two) times daily. 02/23/14  Yes Hendricks Limes, MD  tiotropium (SPIRIVA HANDIHALER) 18 MCG inhalation capsule Place 1 capsule (18 mcg total) into inhaler and inhale daily. 01/22/14 01/22/15 Yes Clinton D Young, MD  NITROSTAT 0.4 MG SL tablet Dissolve 1 tablet under the tongue every 5 minutes as  needed for chest pain (up  to 3 tabs in 15 min then  call 911) 01/21/14   Lorretta Harp, MD    Allergies  Allergen  Reactions  . Ace Inhibitors     REACTION: cough  . Penicillins Other (See Comments)    "childhood allergy"  . Tussionex Pennkinetic Er [Hydrocod Polst-Cpm Polst Er] Other (See Comments)    "caused prostate problems"    Physical Exam  Vitals  Blood pressure 122/58, pulse 64, temperature 97.6 F (36.4 C), temperature source Oral, resp. rate 23, weight 120.657 kg (266 lb), SpO2  93.00%.   General:  Wd, obese male, with cherry cheeks sitting on the side of the bed, mild respiratory distress  Psych:  Normal affect and insight, Not Suicidal or Homicidal, Awake Alert, Oriented X 3.  Neuro:   No F.N deficits, ALL C.Nerves Intact, Strength 5/5 all 4 extremities, Sensation intact all 4 extremities, Plantars down going.  ENT:  Ears and Eyes appear Normal, Conjunctivae clear, PERRLA. Moist Oral Mucosa.  Neck:  Supple, thick, No cervical lymphadenopathy appriciated, No Carotid Bruits.  Respiratory:  Symmetrical Chest wall movement, poor air movement bilaterally  Cardiac:  RRR, No Gallops, Rubs or Murmurs, No Parasternal Heave.  Abdomen:  Positive Bowel Sounds, Abdomen protuberant,  Soft, Non tender, No organomegaly appreciated  Skin:  No Cyanosis, Normal Skin Turgor, No Skin Rash or Bruise.  Extremities:  Good muscle tone,  joints appear normal , no effusions, Normal ROM.   Data Review  CBC  Recent Labs Lab 02/24/14 1226  WBC 4.8  HGB 12.2*  HCT 37.7*  PLT 134*  MCV 91.5  MCH 29.6  MCHC 32.4  RDW 13.9  LYMPHSABS 0.5*  MONOABS 0.2  EOSABS 0.0  BASOSABS 0.0   ------------------------------------------------------------------------------------------------------------------  Chemistries   Recent Labs Lab 02/24/14 1226  NA 142  K 4.1  CL 104  CO2 24  GLUCOSE 154*  BUN 19  CREATININE 0.71  CALCIUM 9.0  AST 23  ALT 21  ALKPHOS 70  BILITOT 0.4     ---------------------------------------------------------------------------------------------------------------  Urinalysis    Component Value Date/Time   COLORURINE YELLOW 01/01/2014 0911   APPEARANCEUR CLEAR 01/01/2014 0911   LABSPEC 1.019 01/01/2014 0911   PHURINE 5.5 01/01/2014 0911   GLUCOSEU NEGATIVE 01/01/2014 0911   HGBUR NEGATIVE 01/01/2014 0911   BILIRUBINUR NEGATIVE 01/01/2014 0911   KETONESUR NEGATIVE 01/01/2014 0911   PROTEINUR NEGATIVE 01/01/2014 0911   UROBILINOGEN 0.2 01/01/2014  0911   NITRITE NEGATIVE 01/01/2014 0911   LEUKOCYTESUR NEGATIVE 01/01/2014 0911    ----------------------------------------------------------------------------------------------------------------  Imaging results:   Dg Chest 2 View  02/24/2014   CLINICAL DATA:  SHORTNESS OF BREATH LEG SWELLING  EXAM: CHEST  2 VIEW  COMPARISON:  CT CTA ABD/PEL W/CM AND/OR W/O CM dated 02/13/2014; DG CHEST 1V PORT dated 01/06/2014  FINDINGS: The cardiac silhouette is enlarged. Low lung volumes. A left pectoral as AICD with lead tip projecting in the region of the right ventricle. There is diffuse prominence of the interstitial markings, and a mild peribronchial cuffing. No focal regions of consolidation or focal infiltrates are appreciated. The osseous structures demonstrate no evidence of acute abnormalities. Patient is status post median sternotomy and coronary artery bypass grafting.  IMPRESSION: Interstitial infiltrate consistent with pulmonary edema. No focal regions of consolidation or focal infiltrates.   Electronically Signed   By: Margaree Mackintosh M.D.   On: 02/24/2014 12:58     My personal review of EKG: Rhythm NSR, PVCs.  Assessment and Plan  Acute on Chronic Heart failure exacerbation Possibly set off by recent bronchitis. Reports compliance with meds / diet. Will give a total of 80 IV lasix in the  ED.  Creatinine is good. Will continue with IV Lasix & continue metolazone and aldactone. Cardiology consulted.  BNP still pending.  Last Echo 12/2012 ef 30 - 35% Patient in some mild respiratory distress.  Will check a blood gas on 2 liters. Admit to CHF tele (unless he needs bipap) with Acute Heart Failure Orders  Acute COPD exacerbation Patient reports bronchitis episode on 1/17. Recurrence of symptoms as of last Thursday.  (started on bactrim and prednisone outpatient) I believe his current symptoms are more related to heart failure than bronchitis but -  Will start levaquin 500 mg IV qd and Nebs, if  SOB does not get better-will add prednisone Not on oxygen at home.  Requiring 2 Liters in the ER.   Has been using CPAP at night and sometimes during the day (for 8 hour periods) at home.  Acute respiratory failure- hypoxic - Likely secondary to acute on chronic systolic heart failure with some contribution from COPD. Continue oxygen, continue to treat the above 2 conditions.  Recent AAA repair January 2015.   Appears Stable.  OSA Continue CPAP QHS  Oxygen during the day.   HTN  Controlled on Current Medications.    HLD Continue Statin  PAD S/P bilateral lower ext stents. S/P left carotid endartectomy  CAD S/P CABG, AICD  H/O DM Diet controlled.  DVT Prophylaxis  Lovenox   AM Labs Ordered, also please review Full Orders  Family Communication:   Wife at bedside   Code Status:  Full.    Likely DC to  Home when appropriate  Condition:  guarded  Time spent in minutes : 60    York, Bobby Rumpf PA-C on 02/24/2014 at 4:53 PM  Between 7am to 7pm - Pager - [phone]  After 7pm go to [email or web address] - password [email or web address]  And look for the night coverage person covering me after hours  [facility] Office  [phone]  Attending - Patient is a 68 year old white male with a history of chronic systolic heart failure who presents with almost a 1 week history of worsening shortness of breath, that is predominantly exertional. He also has associated 17 pound weight gain, 4-5 to orthopnea and associated paroxysmal nocturnal dyspnea. BNP slightly elevated, however chest x-ray is consistent with pulmonary edema. Overall clinical picture is that of acute decompensated systolic heart failure. He does have COPD that is probably contributing as well. Will continue with IV Lasix, monitor weights and follow clinical course. He just has a few rales on exam, however does not have any rhonchi to suggest a acute COPD exacerbation. Therefore will continue with nebulized  bronchodilators and hold off on starting steroids. Cardiology has been consulted for further assistance in management.  Nena Alexander MD

## 2014-02-24 NOTE — Progress Notes (Signed)
OFFICE NOTE  Chief Complaint:  Marked shortness of breath, cough, weight gain, fatigue, leg swelling  Primary Care Physician: [name], MD  HPI:  [name] is a 68 year old patient previously followed by Dr. Rollene Fare. He has extensive peripheral arterial disease (with bilateral iliac, CFA and renal stents - and numerous angioplasties), coronary disease, and exogenous obesity, remote cigarette abuse (quit in 2005) and past polypectomy. He had left CEA a year ago by Dr. Bridgett Larsson that was uncomplicated for high-grade asymptomatic carotid stenosis.  He has a known AAA which apparently has enlarged in size over the past year to 5.7 cm.  He has a known asymptomatic AAA followed by Dr. Bridgett Larsson, and he has had moderate elevation of left renal velocities in the past.  He has remote asymptomatic right carotid occlusion, and his carotid Dopplers now and aneurysm are followed by Dr. Bridgett Larsson as outlined above. He has not had any chest pain, symptoms of ischemia or arrhythmia.  From a cardiac standpoint he has had CABG x2 in 1996, cath in 2008, had occlusion in both vein grafts from his initial CABG, but no significant LAD or diagonal disease, and he has had circumflex collaterals from the left. He had 70% in-stent restenosis of prior left renal artery stent, but he has had good blood pressures. His remote CABG was in 1986 - 2 SVG's to the PDA and LCX, both of which are chronically occluded. At his last cath in 2008 he basically had 40% DX2, 30% LAD beyond DX2, 100% old circumflex and 100% right with left-to-left and left-to-right collaterals.  Nuclear stress testing in 09/2012 was negative for ischemia, but showed a dense inferior scar and EF of 26%.  EF by last echo in 1/14 was approximately 30-35% with no significant valve disease. He had ICD implanted in 2006 for purposes of primary prevention with ischemic cardiomyopathy by Dr. Rollene Fare, and he has had an EOL generator change in May of 2011. He has not had  any ICD shocks recently since being started on low dose amiodarone.   He was seen recently for preoperative cardiovascular risk assessment prior to elective AAA repair.  We did perform interrogation of his Copy single-chamber AICD.  This demonstrated no new shocks and greater than 10 years battery life. He was cleared for surgery and underwent stent grafting. This was apparently successful. He now reports however over the past week he's worsening shortness of breath, productive cough, lower extremities swelling and marked shortness of breath. Today in the office he could barely walk across the office without being short of breath. O2 saturation was down at 89%. He was speaking in short sentences but cannot catch his breath. He was apparently seen by Dr. Linna Darner yesterday and prescribed prednisone and Bactrim. He did not feel his symptoms are improved significantly.  PMHx:  Past Medical History  Diagnosis Date  . Myocardial infarction   . Hypertension   . CHF (congestive heart failure)   . COPD (chronic obstructive pulmonary disease)   . GERD (gastroesophageal reflux disease)   . AAA (abdominal aortic aneurysm)     followed by Dr. Bridgett Larsson  . Tremor   . Dysrhythmia     ICD-defibrillator  . Carotid artery stenosis     LCEA - Dr. Bridgett Larsson in 2013  . Peripheral vascular disease     LCEA, L renal artery stent, bilat iliac stents, R SFA stenosis  . Adenomatous colon polyp 01/2004  . Diverticulosis   . AVM (arteriovenous malformation)   .  CAD (coronary artery disease)     s/p CABGx2 in 1996  . HLD (hyperlipidemia)   . Hypothyroidism   . BPH (benign prostatic hypertrophy)   . Fatigue   . Automatic implantable cardioverter-defibrillator in situ   . OSA on CPAP     AHI durign total sleep 14.69/hr, during REM 50.91/hr  . H/O hiatal hernia   . Anemia     hx  . Complication of anesthesia     claustrophobic, unabe to lie on back more than 4 hours at time due to back  . Diabetes  mellitus     DIET CONTROLLED  . Ischemic cardiomyopathy     Past Surgical History  Procedure Laterality Date  . Coronary artery bypass graft  11/04/1985    x2 - PDA and sequential DX-OM (Dr. Redmond Pulling)  . Cardiac catheterization  09/16/2007    occlusion of both vein grafts, no significant LAD disease or diagonal disease, Cfx collaterals from the left, 70% in-stent restenosis of L renal artery, normal L main, RCA occluded ostially (Dr. Adora Fridge)  . Coronary angioplasty  01/08/2004    cutting balloon atherectomy & percutaneous intervention of RCIA in-stent restenosis (Dr. Marella Chimes)  . Renal artery stent Left 03/24/2004    6x56mm Genesis stent (Dr. Marella Chimes)  . Angioplasty      BILATERAL  LE  W/STENTS  . Endarterectomy  01/04/2012    Procedure: ENDARTERECTOMY CAROTID;left  Surgeon: Hinda Lenis, MD;  Location: [place];  Service: Vascular;  Laterality: Left;  with patch angioplasty  . Carotid endarterectomy Left 01/04/12  . Cardiac defibrillator placement  06/2005    [employer] Vitality HE - ischemic cardiomyopathy - Dr. Marella Chimes  . Iron infusion  June 16, 2012  . Abdominal aortic endovascular stent graft [name] 01/06/2014    Procedure: ABDOMINAL AORTIC ENDOVASCULAR STENT GRAFT- GORE; ULTRASOUND GUIDED;  Surgeon: Conrad Stovall, MD;  Location: [place];  Service: Vascular;  Laterality: [name];  . Polypectomy    . Iliac artery stent Bilateral 03/1997    and L SFA PTA  . Icd generator change  05/02/2010    [employer]  . Transthoracic echocardiogram  12/2012    EF 30-35; LV mod to severely dilated, mod concentric hypertrophy, severe hypokinesis of inferolateral myocardium, moderate hypokineis of anteroseptal region, grade 1 diastolic dysfunction; mod MR; LA mod-severely dialted; RV mod dialted; RA mildly dilated; PA peak pressure 22mmHg  . Nm myocar perf wall motion  09/2012    lexiscan myoview - mod-severe perfusion defect r/t infarct or scar w/mild periinfarct ishcemia in basal  inferior, mid inferior, apical inferior, basal inferolateral & mid inferoalteral regions - EF 21% low risk scan  . Cardiac catheterization  10/03/2002    SVG sequentially to OM1 & OM2 totally occluded at ostium, SVG to PDA totally occluded within previously placed prox vein graft stent, smal distal AAA, bialt iliac stents with 30% left end-stent restenosis and 50% right end-stent restnosis(Dr. Gerrie Nordmann)  . Cardiac catheterization  06/11/1998    L main with 20% narrowing in distal 1/3; LAD with 1st diagonla having 70% ostial narrowing, 2nd diagonal with 40% narrowing in prox third, LIMA & RIMA widely patent; in-stent restenosis of RCIA with successful PTA and new prox SVTRCA stent for residual disease (Dr. Marella Chimes)    FAMHx:  Family History  Problem Relation Age of Onset  . Colon cancer Sister   . Diabetes Sister   . Heart disease Sister   . Heart disease Brother   .  Lung cancer Mother   . Cancer Mother   . Heart attack Maternal Grandmother   . Colon polyps Sister   . Heart attack Father   . COPD Sister     SOCHx:   reports that he quit smoking about 11 years ago. His smoking use included Cigarettes. He has a 60 pack-year smoking history. He has never used smokeless tobacco. He reports that he does not drink alcohol or use illicit drugs.  ALLERGIES:  Allergies  Allergen Reactions  . Ace Inhibitors     REACTION: cough  . Penicillins Other (See Comments)    "childhood allergy"  . Tussionex Pennkinetic Er [Hydrocod Polst-Cpm Polst Er] Other (See Comments)    "caused prostate problems"    ROS: A comprehensive review of systems was negative except for: Constitutional: positive for chills and fatigue Respiratory: positive for cough and dyspnea on exertion Cardiovascular: positive for dyspnea and lower extremity edema  HOME MEDS: No current facility-administered medications for this visit.   No current outpatient prescriptions on file.   Facility-Administered Medications  Ordered in Other Visits  Medication Dose Route Frequency Provider Last Rate Last Dose  . 0.9 %  sodium chloride infusion  250 mL Intravenous PRN Melton Alar, PA-C      . alum & mag hydroxide-simeth (MAALOX/MYLANTA) 200-200-20 MG/5ML suspension 15 mL  15 mL Oral Q6H PRN Melton Alar, PA-C      . [START ON 02/25/2014] amiodarone (PACERONE) tablet 200 mg  200 mg Oral Daily Melton Alar, PA-C      . [START ON 02/25/2014] aspirin EC tablet 81 mg  81 mg Oral Daily Melton Alar, PA-C      . [START ON 02/25/2014] atorvastatin (LIPITOR) tablet 40 mg  40 mg Oral Daily Melton Alar, PA-C      . [START ON 02/25/2014] carvedilol (COREG) tablet 25 mg  25 mg Oral BID WC Melton Alar, PA-C      . [START ON 02/25/2014] clopidogrel (PLAVIX) tablet 75 mg  75 mg Oral Daily Marianne L York, PA-C      . enoxaparin (LOVENOX) injection 40 mg  40 mg Subcutaneous Q24H Marianne L York, PA-C      . finasteride (PROSCAR) tablet 5 mg  5 mg Oral Daily Marianne L York, PA-C      . furosemide (LASIX) injection 60 mg  60 mg Intravenous BID Jonetta Osgood, MD      . ipratropium-albuterol (DUONEB) 0.5-2.5 (3) MG/3ML nebulizer solution 3 mL  3 mL Nebulization Q4H Marianne L York, PA-C      . isosorbide mononitrate (IMDUR) 24 hr tablet 30 mg  30 mg Oral QHS Jonetta Osgood, MD      . Derrill Memo ON 02/25/2014] isosorbide mononitrate (IMDUR) 24 hr tablet 60 mg  60 mg Oral q morning - 10a Shanker Kristeen Mans, MD      . levofloxacin (LEVAQUIN) IVPB 500 mg  500 mg Intravenous Q24H Jonetta Osgood, MD      . Derrill Memo ON 02/25/2014] levothyroxine (SYNTHROID, LEVOTHROID) tablet 50 mcg  50 mcg Oral QAC breakfast Melton Alar, PA-C      . LORazepam (ATIVAN) tablet 0.5 mg  0.5 mg Oral QHS PRN Melton Alar, PA-C      . metolazone (ZAROXOLYN) tablet 2.5 mg  2.5 mg Oral Daily PRN Melton Alar, PA-C      . nitroGLYCERIN (NITROSTAT) SL tablet 0.4 mg  0.4 mg Sublingual Q5 min PRN Melton Alar,  PA-C      . Derrill Memo ON 02/25/2014]  omega-3 acid ethyl esters (LOVAZA) capsule 1 g  1 g Oral Daily Marianne L York, PA-C      . ondansetron (ZOFRAN) injection 4 mg  4 mg Intravenous Q8H PRN Melton Alar, PA-C      . potassium chloride SA (K-DUR,KLOR-CON) CR tablet 40 mEq  40 mEq Oral Daily Bobby Rumpf York, PA-C      . ramipril (ALTACE) capsule 2.5 mg  2.5 mg Oral QPM Jonetta Osgood, MD      . Derrill Memo ON 02/25/2014] ramipril (ALTACE) capsule 5 mg  5 mg Oral q morning - 10a Shanker Kristeen Mans, MD      . sodium chloride 0.9 % injection 3 mL  3 mL Intravenous Q12H Marianne L York, PA-C      . sodium chloride 0.9 % injection 3 mL  3 mL Intravenous PRN Melton Alar, PA-C      . [START ON 02/25/2014] spironolactone (ALDACTONE) tablet 25 mg  25 mg Oral Daily Melton Alar, PA-C        LABS/IMAGING: No results found for this or any previous visit (from the past 48 hour(s)). Dg Chest 2 View  02/24/2014   CLINICAL DATA:  SHORTNESS OF BREATH LEG SWELLING  EXAM: CHEST  2 VIEW  COMPARISON:  CT CTA ABD/PEL W/CM AND/OR W/O CM dated 02/13/2014; DG CHEST 1V PORT dated 01/06/2014  FINDINGS: The cardiac silhouette is enlarged. Low lung volumes. A left pectoral as AICD with lead tip projecting in the region of the right ventricle. There is diffuse prominence of the interstitial markings, and a mild peribronchial cuffing. No focal regions of consolidation or focal infiltrates are appreciated. The osseous structures demonstrate no evidence of acute abnormalities. Patient is status post median sternotomy and coronary artery bypass grafting.  IMPRESSION: Interstitial infiltrate consistent with pulmonary edema. No focal regions of consolidation or focal infiltrates.   Electronically Signed   By: Margaree Mackintosh M.D.   On: 02/24/2014 12:58    VITALS: BP 108/70  Pulse 64  Ht 5\' 8"  (1.727 m)  Wt 267 lb (121.11 kg)  BMI 40.61 kg/m2  SpO2 89%  EXAM: General appearance: alert, moderate distress and morbidly obese Neck: JVD - 3 cm above sternal notch and  no carotid bruit Lungs: diminished breath sounds bilaterally Heart: regular rate and rhythm Abdomen: soft, non-tender; bowel sounds normal; no masses,  no organomegaly and obese Extremities: edema 2+ pitting edema bilaterally Pulses: 2+ and symmetric Skin: Skin color, texture, turgor normal. No rashes or lesions Neurologic: Mental status: Awake, difficulty phonating due to dyspnea Psych: Anxious  EKG: NSR at 64 with PVC's.  ASSESSMENT: 1. Acute on chronic systolic, congestive heart failure comment an NYHA class IV symptoms 2. Possible bronchopneumonia 3. Significant PAD (detailed history provided in HPI) with multiple interventions 4. AAA - status post recent EVAR 5. CAD  - s/p 2 vessel CABG in 1996 6. S/p AICD - single chamber [employer] 7. Ischemic cardiomyopathy - EF 30-35% by echo in 12/2012 8. Myoview in 09/2012 - fixed inferior scar, no ischemia  PLAN: 1.   [name] appears to be in decompensated heart failure today and has had a 17 pound weight gain since I last saw him. He's had progressive shortness of breath and I do not feel is responding to diuretics. He's had no improvement with antibiotics over the past 24 hours. Based on his history and symptoms, I feel that he should  be evaluated in the emergency room and most likely will need admission for IV diuresis and broadening of his antibiotics if there is a possible bronchopneumonia. He is agreeable to going to the emergency room. We will contact the ER to advise them of his arrival.  Pixie Casino, MD, [facility] Attending Cardiologist [facility]  Toshua Honsinger C 02/24/2014, 6:51 PM

## 2014-02-24 NOTE — Telephone Encounter (Signed)
Relevant patient education assigned to patient using Emmi. ° °

## 2014-02-24 NOTE — Consult Note (Addendum)
Referring Physician: [facility]  Primary Cardiologist: [place] Reason for Consultation: HF   HPI:  [name] is a pleasant 68 y.o. male, with a past medical history significant for morbid obesity, congestive heart failure with an EF of 30-35% (January 2014), AICD placement, CAD s/p CABG 1996,LBBB,  COPD, obstructive sleep apnea, AAA repair (January 2015) who is admitted with acute on chronic HF.   Says for past few days has noted worsening SOB, LE edema, orthopnea and PND. Says he weighs himself most days of the week but doesn't know how much weight he has gained. Saw PCP yesterday and started abx and prednisone without improvement. Saw Dr. Debara Pickett today and referred to ER for HF. Has been using CPAP during day to help with breathing. Drinks 68 oz of Green Tea per day. Denies CP.   Presented to ED with marked respiratory distress. CXR c/w CHF. pBNP 672.  Ph 7.4/40/71/94% on 2L. Given lasix and diuresed > 2L so far. Breathing better but still SOB at rest. No CP.    Review of Systems:     Cardiac Review of Systems: {Y] = yes [ ]  = no  Chest Pain [    ]  Resting SOB [ y] Exertional SOB  [  y]  Orthopnea Blue.Reese  ]   Pedal Edema [   y]    Palpitations [  ] Syncope  [  ]   Presyncope [   ]  General Review of Systems: [Y] = yes [  ]=no Constitional: recent weight change [ y ]; anorexia [  ]; fatigue [  y]; nausea [  ]; night sweats [  ]; fever [  ]; or chills [  ];                                                                     Eyes : blurred vision [  ]; diplopia [   ]; vision changes [  ];  Amaurosis fugax[  ]; Resp: cough [ y ];  wheezing[  ];  hemoptysis[  ];  PND [  ];  GI:  gallstones[  ], vomiting[  ];  dysphagia[  ]; melena[  ];  hematochezia [  ]; heartburn[  ];   GU: kidney stones [  ]; hematuria[  ];   dysuria [  ];  nocturia[  ]; incontinence [  ];             Skin: rash, swelling[y  ];, hair loss[  ];  peripheral edema[  ];  or itching[  ]; Musculosketetal: myalgias[  ];  joint swelling[   ];  joint erythema[  ];  joint pain[ y ];  back pain[  ];  Heme/Lymph: bruising[  ];  bleeding[  ];  anemia[  ];  Neuro: TIA[  ];  headaches[  ];  stroke[  ];  vertigo[  ];  seizures[  ];   paresthesias[  ];  difficulty walking[  ];  Psych:depression[  ]; anxiety[  ];  Endocrine: diabetes[  ];  thyroid dysfunction[  ];  Other:  Past Medical History  Diagnosis Date  . Myocardial infarction   . Hypertension   . CHF (congestive heart failure)   . COPD (chronic obstructive pulmonary disease)   .  GERD (gastroesophageal reflux disease)   . AAA (abdominal aortic aneurysm)     followed by Dr. Bridgett Larsson  . Tremor   . Dysrhythmia     ICD-defibrillator  . Carotid artery stenosis     LCEA - Dr. Bridgett Larsson in 2013  . Peripheral vascular disease     LCEA, L renal artery stent, bilat iliac stents, R SFA stenosis  . Adenomatous colon polyp 01/2004  . Diverticulosis   . AVM (arteriovenous malformation)   . CAD (coronary artery disease)     s/p CABGx2 in 1996  . HLD (hyperlipidemia)   . Hypothyroidism   . BPH (benign prostatic hypertrophy)   . Fatigue   . Automatic implantable cardioverter-defibrillator in situ   . OSA on CPAP     AHI durign total sleep 14.69/hr, during REM 50.91/hr  . H/O hiatal hernia   . Anemia     hx  . Complication of anesthesia     claustrophobic, unabe to lie on back more than 4 hours at time due to back  . Diabetes mellitus     DIET CONTROLLED  . Ischemic cardiomyopathy     Medications Prior to Admission  Medication Sig Dispense Refill  . albuterol (PROVENTIL HFA;VENTOLIN HFA) 108 (90 BASE) MCG/ACT inhaler Inhale 2 puffs into the lungs every 6 (six) hours as needed for wheezing or shortness of breath.  1 Inhaler  3  . amiodarone (CORDARONE) 200 MG tablet Take 200 mg by mouth daily.       Marland Kitchen aspirin 81 MG tablet Take 81 mg by mouth daily.      Marland Kitchen atorvastatin (LIPITOR) 40 MG tablet Take 1 tablet (40 mg total) by mouth daily.  90 tablet  3  . carvedilol (COREG) 25 MG  tablet Take 1 tablet (25 mg total) by mouth 2 (two) times daily with a meal.  180 tablet  3  . cholecalciferol (VITAMIN D) 1000 UNITS tablet Take 1,000 Units by mouth daily.      . clopidogrel (PLAVIX) 75 MG tablet Take 75 mg by mouth daily.        . finasteride (PROSCAR) 5 MG tablet Take 5 mg by mouth daily.      . fish oil-omega-3 fatty acids 1000 MG capsule Take 1 g by mouth daily.       . furosemide (LASIX) 80 MG tablet Take 1 tablet (80 mg total) by mouth 2 (two) times daily.  180 tablet  3  . isosorbide mononitrate (IMDUR) 60 MG 24 hr tablet Take 30-60 mg by mouth 2 (two) times daily. Take 1 tablet every am and 0.5 tablet at bedtime      . levothyroxine (SYNTHROID, LEVOTHROID) 50 MCG tablet TAKE 1 TABLET (50 MCG TOTAL) BY MOUTH DAILY.  90 tablet  1  . metolazone (ZAROXOLYN) 2.5 MG tablet Take 2.5 mg by mouth daily as needed (for swelling).       . Multiple Vitamins-Minerals (MENS 50+ MULTI VITAMIN/MIN) TABS Take by mouth daily.      . multivitamin-lutein (OCUVITE-LUTEIN) CAPS Take 1 capsule by mouth daily.        . predniSONE (DELTASONE) 20 MG tablet Take 1 tablet (20 mg total) by mouth 2 (two) times daily.  14 tablet  0  . ramipril (ALTACE) 2.5 MG capsule Take 2.5-5 mg by mouth 2 (two) times daily. Take 2 capsules (5mg ) in AM. Take 1 capsule (2.5mg ) in PM      . spironolactone (ALDACTONE) 25 MG tablet Take 1 tablet (25 mg  total) by mouth daily.  90 tablet  3  . sulfamethoxazole-trimethoprim (BACTRIM DS) 800-160 MG per tablet Take 1 tablet by mouth 2 (two) times daily.  20 tablet  0  . tiotropium (SPIRIVA HANDIHALER) 18 MCG inhalation capsule Place 1 capsule (18 mcg total) into inhaler and inhale daily.  90 capsule  3  . NITROSTAT 0.4 MG SL tablet Dissolve 1 tablet under the tongue every 5 minutes as  needed for chest pain (up  to 3 tabs in 15 min then  call 911)  25 tablet  3     . [START ON 02/25/2014] amiodarone  200 mg Oral Daily  . [START ON 02/25/2014] aspirin EC  81 mg Oral Daily  .  [START ON 02/25/2014] atorvastatin  40 mg Oral Daily  . [START ON 02/25/2014] carvedilol  25 mg Oral BID WC  . [START ON 02/25/2014] clopidogrel  75 mg Oral Daily  . enoxaparin (LOVENOX) injection  40 mg Subcutaneous Q24H  . finasteride  5 mg Oral Daily  . furosemide  60 mg Intravenous BID  . ipratropium-albuterol  3 mL Nebulization Q4H  . isosorbide mononitrate  30 mg Oral QHS  . [START ON 02/25/2014] isosorbide mononitrate  60 mg Oral q morning - 10a  . levofloxacin (LEVAQUIN) IV  500 mg Intravenous Q24H  . [START ON 02/25/2014] levothyroxine  50 mcg Oral QAC breakfast  . [START ON 02/25/2014] omega-3 acid ethyl esters  1 g Oral Daily  . potassium chloride  40 mEq Oral Daily  . ramipril  2.5 mg Oral QPM  . [START ON 02/25/2014] ramipril  5 mg Oral q morning - 10a  . sodium chloride  3 mL Intravenous Q12H  . [START ON 02/25/2014] spironolactone  25 mg Oral Daily    Infusions:    Allergies  Allergen Reactions  . Ace Inhibitors     REACTION: cough  . Penicillins Other (See Comments)    "childhood allergy"  . Tussionex Pennkinetic Er [Hydrocod Polst-Cpm Polst Er] Other (See Comments)    "caused prostate problems"    History   Social History  . Marital Status: Married    Spouse Name: [name]    Number of Children: 2  . Years of Education: [name]   Occupational History  . retired Solicitor     Social History Main Topics  . Smoking status: Former Smoker -- 1.50 packs/day for 40 years    Types: Cigarettes    Quit date: 12/11/2002  . Smokeless tobacco: Never Used  . Alcohol Use: No  . Drug Use: No  . Sexual Activity: Not Currently   Other Topics Concern  . Not on file   Social History Narrative  . No narrative on file    Family History  Problem Relation Age of Onset  . Colon cancer Sister   . Diabetes Sister   . Heart disease Sister   . Heart disease Brother   . Lung cancer Mother   . Cancer Mother   . Heart attack Maternal Grandmother   . Colon polyps  Sister   . Heart attack Father   . COPD Sister     PHYSICAL EXAM: Filed Vitals:   02/24/14 1740  BP: 128/50  Pulse: 72  Temp: 98 F (36.7 C)  Resp: 22     Intake/Output Summary (Last 24 hours) at 02/24/14 1851 Last data filed at 02/24/14 1817  Gross per 24 hour  Intake      0 ml  Output  1225 ml  Net  -1225 ml    General:  Obese male sitting in chair. Dyspneic with talking.  HEENT: normal Neck: supple. Thick hard to see JVD but appear up. Carotids 2+ bilat; no bruits. No lymphadenopathy or thryomegaly appreciated. Cor: PMI nonpalpable. Distant HS Regular rate & rhythm with occasional ectopy No obvious bs, gallops or murmurs. Lungs: clear markedly decreased at both bases Abdomen: obese soft, nontender, + distended.  No bruits or masses. Good bowel sounds. Extremities: no cyanosis, clubbing, rash, 3+ edema bilaterally Neuro: alert & oriented x 3, cranial nerves grossly intact. moves all 4 extremities w/o difficulty. Affect pleasant.  ECG: SR with LBBB and frequent PVCs  Results for orders placed during the hospital encounter of 02/24/14 (from the past 24 hour(s))  PRO B NATRIURETIC PEPTIDE     Status: Abnormal   Collection Time    02/24/14 12:26 PM      Result Value Ref Range   Pro B Natriuretic peptide (BNP) 762.0 (*) 0 - 125 pg/mL  CBC WITH DIFFERENTIAL     Status: Abnormal   Collection Time    02/24/14 12:26 PM      Result Value Ref Range   WBC 4.8  4.0 - 10.5 K/uL   RBC 4.12 (*) 4.22 - 5.81 MIL/uL   Hemoglobin 12.2 (*) 13.0 - 17.0 g/dL   HCT 37.7 (*) 39.0 - 52.0 %   MCV 91.5  78.0 - 100.0 fL   MCH 29.6  26.0 - 34.0 pg   MCHC 32.4  30.0 - 36.0 g/dL   RDW 13.9  11.5 - 15.5 %   Platelets 134 (*) 150 - 400 K/uL   Neutrophils Relative % 85 (*) 43 - 77 %   Neutro Abs 4.1  1.7 - 7.7 K/uL   Lymphocytes Relative 9 (*) 12 - 46 %   Lymphs Abs 0.5 (*) 0.7 - 4.0 K/uL   Monocytes Relative 5  3 - 12 %   Monocytes Absolute 0.2  0.1 - 1.0 K/uL   Eosinophils Relative 0  0  - 5 %   Eosinophils Absolute 0.0  0.0 - 0.7 K/uL   Basophils Relative 0  0 - 1 %   Basophils Absolute 0.0  0.0 - 0.1 K/uL  COMPREHENSIVE METABOLIC PANEL     Status: Abnormal   Collection Time    02/24/14 12:26 PM      Result Value Ref Range   Sodium 142  137 - 147 mEq/L   Potassium 4.1  3.7 - 5.3 mEq/L   Chloride 104  96 - 112 mEq/L   CO2 24  19 - 32 mEq/L   Glucose, Bld 154 (*) 70 - 99 mg/dL   BUN 19  6 - 23 mg/dL   Creatinine, Ser 0.71  0.50 - 1.35 mg/dL   Calcium 9.0  8.4 - 10.5 mg/dL   Total Protein 6.7  6.0 - 8.3 g/dL   Albumin 3.3 (*) 3.5 - 5.2 g/dL   AST 23  0 - 37 U/L   ALT 21  0 - 53 U/L   Alkaline Phosphatase 70  39 - 117 U/L   Total Bilirubin 0.4  0.3 - 1.2 mg/dL   GFR calc non Af Amer >90  >90 mL/min   GFR calc Af Amer >90  >90 mL/min  Randolm Idol, ED     Status: None   Collection Time    02/24/14 12:44 PM      Result Value Ref Range   Troponin  i, poc 0.01  0.00 - 0.08 ng/mL   Comment 3           I-STAT ARTERIAL BLOOD GAS, ED     Status: Abnormal   Collection Time    02/24/14  4:50 PM      Result Value Ref Range   pH, Arterial 7.401  7.350 - 7.450   pCO2 arterial 40.4  35.0 - 45.0 mmHg   pO2, Arterial 71.0 (*) 80.0 - 100.0 mmHg   Bicarbonate 25.1 (*) 20.0 - 24.0 mEq/L   TCO2 26  0 - 100 mmol/L   O2 Saturation 94.0     Patient temperature 98.6 F     Collection site RADIAL, ALLEN'S TEST ACCEPTABLE     Drawn by RT     Sample type ARTERIAL    URINALYSIS, ROUTINE W REFLEX MICROSCOPIC     Status: Abnormal   Collection Time    02/24/14  5:09 PM      Result Value Ref Range   Color, Urine YELLOW  YELLOW   APPearance CLEAR  CLEAR   Specific Gravity, Urine 1.011  1.005 - 1.030   pH 6.0  5.0 - 8.0   Glucose, UA 100 (*) NEGATIVE mg/dL   Hgb urine dipstick NEGATIVE  NEGATIVE   Bilirubin Urine NEGATIVE  NEGATIVE   Ketones, ur NEGATIVE  NEGATIVE mg/dL   Protein, ur NEGATIVE  NEGATIVE mg/dL   Urobilinogen, UA 1.0  0.0 - 1.0 mg/dL   Nitrite NEGATIVE  NEGATIVE     Leukocytes, UA NEGATIVE  NEGATIVE  GLUCOSE, CAPILLARY     Status: Abnormal   Collection Time    02/24/14  5:16 PM      Result Value Ref Range   Glucose-Capillary 122 (*) 70 - 99 mg/dL   Dg Chest 2 View  02/24/2014   CLINICAL DATA:  SHORTNESS OF BREATH LEG SWELLING  EXAM: CHEST  2 VIEW  COMPARISON:  CT CTA ABD/PEL W/CM AND/OR W/O CM dated 02/13/2014; DG CHEST 1V PORT dated 01/06/2014  FINDINGS: The cardiac silhouette is enlarged. Low lung volumes. A left pectoral as AICD with lead tip projecting in the region of the right ventricle. There is diffuse prominence of the interstitial markings, and a mild peribronchial cuffing. No focal regions of consolidation or focal infiltrates are appreciated. The osseous structures demonstrate no evidence of acute abnormalities. Patient is status post median sternotomy and coronary artery bypass grafting.  IMPRESSION: Interstitial infiltrate consistent with pulmonary edema. No focal regions of consolidation or focal infiltrates.   Electronically Signed   By: Margaree Mackintosh M.D.   On: 02/24/2014 12:58     ASSESSMENT:  1. A/c systolic HF due to iCM EF 30-35% 2. Acute respiratory failure 3. Morbid obesity 4. CAD s/p CABG 5. OSA on CPAP  6. LBBB  PLAN/DISCUSSION:  He is markedly volume overloaded in the setting of dietary noncompliance. His respiratory status is somewhat tenuous but ABG reassuring. Would continue with aggressive diuresis with lasix 80 mg IV tid. If gets worse will need transfer to SDU for BiPAP. Supp electrolytes as needed. Educated on need for fluid restriction, daily weights and use of sliding scale diuretic regimen. The HF clinic would be happy to follow as an outpatient, if desired. I do not see convincing evidence of PNA at this point.   Daniel Bensimhon,MD 7:04 PM

## 2014-02-24 NOTE — Progress Notes (Signed)
Rapid Response on unit. Follow up assessment made by rapid. Stated to notify if receiving RN in needed any further assistance and to continue to monitor patient.

## 2014-02-24 NOTE — Progress Notes (Signed)
Rapid response notified to come take a second look on patient.

## 2014-02-25 ENCOUNTER — Encounter (HOSPITAL_COMMUNITY): Payer: Self-pay | Admitting: Emergency Medicine

## 2014-02-25 DIAGNOSIS — M7989 Other specified soft tissue disorders: Secondary | ICD-10-CM

## 2014-02-25 DIAGNOSIS — I369 Nonrheumatic tricuspid valve disorder, unspecified: Secondary | ICD-10-CM

## 2014-02-25 LAB — BASIC METABOLIC PANEL WITH GFR
BUN: 20 mg/dL (ref 6–23)
CO2: 27 meq/L (ref 19–32)
Calcium: 9.1 mg/dL (ref 8.4–10.5)
Chloride: 100 meq/L (ref 96–112)
Creatinine, Ser: 0.88 mg/dL (ref 0.50–1.35)
GFR calc Af Amer: >90 mL/min
GFR calc non Af Amer: 86 mL/min — ABNORMAL LOW
Glucose, Bld: 140 mg/dL — ABNORMAL HIGH (ref 70–99)
Potassium: 3.8 meq/L (ref 3.7–5.3)
Sodium: 143 meq/L (ref 137–147)

## 2014-02-25 LAB — GLUCOSE, CAPILLARY
Glucose-Capillary: 94 mg/dL (ref 70–99)
Glucose-Capillary: 112 mg/dL — ABNORMAL HIGH (ref 70–99)
Glucose-Capillary: 140 mg/dL — ABNORMAL HIGH (ref 70–99)
Glucose-Capillary: 142 mg/dL — ABNORMAL HIGH (ref 70–99)

## 2014-02-25 LAB — TROPONIN I
Troponin I: <0.3 ng/mL
Troponin I: <0.3 ng/mL (ref <0.30)

## 2014-02-25 LAB — MAGNESIUM: Magnesium: 2 mg/dL (ref 1.5–2.5)

## 2014-02-25 MED ORDER — LORAZEPAM 0.5 MG PO TABS
0.5000 mg | ORAL_TABLET | Freq: Two times a day (BID) | ORAL | Status: DC | PRN
  Administered 2014-02-25: 0.5 mg via ORAL
  Filled 2014-02-25: qty 1

## 2014-02-25 MED ORDER — PANTOPRAZOLE SODIUM 40 MG PO TBEC
40.0000 mg | DELAYED_RELEASE_TABLET | Freq: Every day | ORAL | Status: DC
  Administered 2014-02-25 – 2014-03-03 (×7): 40 mg via ORAL
  Filled 2014-02-25 (×7): qty 1

## 2014-02-25 NOTE — Progress Notes (Signed)
*  PRELIMINARY RESULTS* Vascular Ultrasound Lower extremity venous duplex has been completed.  Preliminary findings: No evidence of DVT.   Landry Mellow, RDMS, RVT  02/25/2014, 1:44 PM

## 2014-02-25 NOTE — Plan of Care (Signed)
Problem: Phase I Progression Outcomes Goal: EF % per last Echo/documented,Core Reminder form on chart EF 20-25% Outcome: Completed/Met Date Met:  02/25/14 EF 20-25%

## 2014-02-25 NOTE — Progress Notes (Signed)
Report given to receiving RN. Patient in chair, with family at bedside. No verbal complaints. No signs or symptoms of distress or discomfort.

## 2014-02-25 NOTE — Progress Notes (Signed)
Pt very SOB even at rest, unable to tolerate be on bed, pt up in chair, family member at the bedside, medication given as ordered. We'll continue with POC.

## 2014-02-25 NOTE — Progress Notes (Signed)
Echo Lab  2D Echocardiogram completed.  [place], [place] 02/25/2014 2:01 PM

## 2014-02-25 NOTE — Progress Notes (Signed)
Patient Name: [name] Date of Encounter: 02/25/2014  Principal Problem:   Acute on chronic systolic congestive heart failure, NYHA class 4 Active Problems:   Cardiomyopathy, ischemic   HYPERTENSION   CAD   COPD, moderately severe   OSA on CPAP   Hyperlipidemia LDL goal < 70   PAD (peripheral artery disease)   AICD (automatic cardioverter/defibrillator) present   SUBJECTIVE  Breathing much improved though he remains dyspneic with speech.  Wt down 7 lbs.  CURRENT MEDS . amiodarone  200 mg Oral Daily  . aspirin EC  81 mg Oral Daily  . atorvastatin  40 mg Oral Daily  . carvedilol  25 mg Oral BID WC  . clopidogrel  75 mg Oral Daily  . enoxaparin (LOVENOX) injection  40 mg Subcutaneous Q24H  . finasteride  5 mg Oral Daily  . furosemide  80 mg Intravenous BID  . ipratropium-albuterol  3 mL Nebulization BID  . isosorbide mononitrate  30 mg Oral QHS  . isosorbide mononitrate  60 mg Oral q morning - 10a  . levofloxacin (LEVAQUIN) IV  500 mg Intravenous Q24H  . levothyroxine  50 mcg Oral QAC breakfast  . omega-3 acid ethyl esters  1 g Oral Daily  . potassium chloride  40 mEq Oral BID  . ramipril  2.5 mg Oral QPM  . ramipril  5 mg Oral q morning - 10a  . sodium chloride  3 mL Intravenous Q12H  . spironolactone  25 mg Oral Daily   OBJECTIVE  Filed Vitals:   02/24/14 2146 02/24/14 2245 02/25/14 0611 02/25/14 0814  BP: 137/59  127/49   Pulse: 66 68 67   Temp: 97.8 F (36.6 C)  98.7 F (37.1 C)   TempSrc: Oral  Oral   Resp: 22 20 20    Height:      Weight:   259 lb 11.2 oz (117.8 kg)   SpO2: 94% 95% 95% 95%    Intake/Output Summary (Last 24 hours) at 02/25/14 0916 Last data filed at 02/25/14 0600  Gross per 24 hour  Intake    538 ml  Output   3575 ml  Net  -3037 ml   Filed Weights   02/24/14 1205 02/24/14 1740 02/25/14 0611  Weight: 266 lb (120.657 kg) 262 lb 12.8 oz (119.205 kg) 259 lb 11.2 oz (117.8 kg)   PHYSICAL EXAM  General: Pleasant, NAD. Neuro:  Alert and oriented X 3. Moves all extremities spontaneously. Psych: Normal affect. HEENT:  Normal  Neck: Supple without bruits.  JVP to jaw. Lungs:  Resp regular and unlabored (labored with speech), diminished breath sounds bilat with scatt rhonchi. Heart: RRR, distant, no s3, s4, or murmurs. Abdomen: Semi-firm, non-tender, BS + x 4.  Extremities: No clubbing, cyanosis.  3+ bilat LE edema. DP/PT/Radials 1+ and equal bilaterally.  Accessory Clinical Findings  CBC  Recent Labs  02/24/14 1226  WBC 4.8  NEUTROABS 4.1  HGB 12.2*  HCT 37.7*  MCV 91.5  PLT 094*   Basic Metabolic Panel  Recent Labs  02/24/14 1226 02/25/14 0001  NA 142 143  K 4.1 3.8  CL 104 100  CO2 24 27  GLUCOSE 154* 140*  BUN 19 20  CREATININE 0.71 0.88  CALCIUM 9.0 9.1  MG  --  2.0   Liver Function Tests  Recent Labs  02/24/14 1226  AST 23  ALT 21  ALKPHOS 70  BILITOT 0.4  PROT 6.7  ALBUMIN 3.3*   Cardiac Enzymes  Recent Labs  02/24/14 1910 02/24/14 2358 02/25/14 0520  TROPONINI <0.30 <0.30 <0.30   TELE  Rsr, pvc's.  Radiology/Studies  Dg Chest 2 View  02/24/2014   CLINICAL DATA:  SHORTNESS OF BREATH LEG SWELLING  EXAM: CHEST  2 VIEW IMPRESSION: Interstitial infiltrate consistent with pulmonary edema. No focal regions of consolidation or focal infiltrates.   Electronically Signed   By: Margaree Mackintosh M.D.   On: 02/24/2014 12:58   ASSESSMENT AND PLAN  1.  Acute on chronic systolic chf/ICM:  -3L with 7 lbs weight loss since admission.  Still with marked volume overload.  Renal fxn stable.  Says that when he was 230 lbs, he really felt good - still 29 lbs above that.  He's been wearing cpap @ home over the past 4 days due to severe dyspnea at rest, but didn't want to come to ER.  Cont IV diuresis, bb, acei, spiro.  2.  CAD:  No chest pain.  Trop normal.  Cont asa, statin, bb, plavix, nitrate.  3.  HL:  On statin.  LFT's wnl.  4.  HTN:  Stable.  Signed, Murray Hodgkins  NP Agree with above assessment.  Patient feels significantly better already since diuresing overnight 7 pounds.  Still has moderate peripheral edema.  Breath sounds are distant.  Jugular venous pressure is elevated.  Continue IV diuresis.  Telemetry shows normal sinus rhythm with frequent PVCs.

## 2014-02-25 NOTE — Care Management Note (Addendum)
    Page 1 of 2   03/02/2014     4:22:27 PM   CARE MANAGEMENT NOTE 03/02/2014  Patient:  [name], [name]   Account Number:  [id number]  Date Initiated:  02/25/2014  Documentation initiated by:  [facility]  Subjective/Objective Assessment:   68 y.o. male, with PMHx  congestive heart failure with an EF of 30-35%, AICD placement, COPD, obstructive sleep apnea, AAA repair; who presents to ER SOB//Home with spouse     Action/Plan:   IV Lasix & continue metolazone and aldactone.Cardiology consulted.  BNPl pending.  Last Echo 12/2012 ef 30 - 35%  Patient in some mild respiratory distress.  Admit to CHF tele//Access for [facility] needs   Anticipated DC Date:  03/01/2014   Anticipated DC Plan:  [place]  CM consult      Choice offered to / List presented to:     DME arranged  [place]      DME agency  [place] arranged  HH-1 RN  HH-10 DISEASE MANAGEMENT  [place].   Status of service:  Completed, signed off Medicare Important Message given?   (If response is "NO", the following Medicare IM given date fields will be blank) Date Medicare IM given:   Date Additional Medicare IM given:    Discharge Disposition:    Per UR Regulation:    If discussed at Long Length of Stay Meetings, dates discussed:   03/03/2014    Comments:  03/02/14 [place], RN, BSN, [employer] [phone] Spoke with pt at bedside regarding discharge planning for [facility]. Offered pt list of home health agencies to choose from.  Pt chose [facility] to render services. Christa See, RN of [place] notified.  DME needs identified at this time include rollator and shower chair.

## 2014-02-25 NOTE — Consult Note (Addendum)
Heart Failure Navigator Consult Note  Presentation: [name]  is an extremely pleasant 68 y.o. male, with a past medical history significant for congestive heart failure with an EF of 30-35% (January 2014), AICD placement, COPD, obstructive sleep apnea, AAA repair (January 2015) who presents to the emergency department complaining of shortness of breath that started last Thursday. The patient reports coughing up green sputum, he went to see his primary care physician started him on Bactrim and prednisone. His symptoms did not improve. To the contrary for the past 3 nights he has been sitting up in a chair unable to breathe. He reports compliance with a low-salt diet and taking his medications. However, he mentions that when he has an appointment he will skip his Lasix dose so that he can avoid frequent bathroom trips. He complains of musculoskeletal chest pain that he attributes to coughing. He mentions that he wears his CPAP at night, but frequently also has to wear it for 8 hours at a time during the day. He complains of recent chills, rigors, and diaphoresis. He has gained 17 pounds since late January.   Past Medical History  Diagnosis Date  . Myocardial infarction   . Hypertension   . CHF (congestive heart failure)   . COPD (chronic obstructive pulmonary disease)   . GERD (gastroesophageal reflux disease)   . AAA (abdominal aortic aneurysm)     followed by Dr. Bridgett Larsson  . Tremor   . Dysrhythmia     ICD-defibrillator  . Carotid artery stenosis     LCEA - Dr. Bridgett Larsson in 2013  . Peripheral vascular disease     LCEA, L renal artery stent, bilat iliac stents, R SFA stenosis  . Adenomatous colon polyp 01/2004  . Diverticulosis   . AVM (arteriovenous malformation)   . CAD (coronary artery disease)     s/p CABGx2 in 1996  . HLD (hyperlipidemia)   . Hypothyroidism   . BPH (benign prostatic hypertrophy)   . Fatigue   . Automatic implantable cardioverter-defibrillator in situ   . OSA on CPAP      AHI durign total sleep 14.69/hr, during REM 50.91/hr  . H/O hiatal hernia   . Anemia     hx  . Complication of anesthesia     claustrophobic, unabe to lie on back more than 4 hours at time due to back  . Diabetes mellitus     DIET CONTROLLED  . Ischemic cardiomyopathy     History   Social History  . Marital Status: Married    Spouse Name: [name]    Number of Children: 2  . Years of Education: [name]   Occupational History  . retired Solicitor     Social History Main Topics  . Smoking status: Former Smoker -- 1.50 packs/day for 40 years    Types: Cigarettes    Quit date: 12/11/2002  . Smokeless tobacco: Former Systems developer  . Alcohol Use: No  . Drug Use: No  . Sexual Activity: Not Currently   Other Topics Concern  . None   Social History Narrative  . None    ECHO:Study Conclusions--02/25/2014 - Left ventricle: The cavity size was moderately dilated. Wall thickness was normal. LV mass 224g. LVMI 94 g/m2 - RWT 0.27, therefore normal geometry. Systolic function was severely reduced. The estimated ejection fraction was in the range of 20% to 25%. Doppler parameters are consistent withpseduonormal left ventricular relaxation (grade 2 diastolic dysfunction). The E/A ratio is 1.5. The E/e' ratio is >  15, suggesting markedly elevated LV filling pressure. Severe global hypokinesis with inferior akinesis. - Aortic valve: Mildly calcified leaflets. There was no stenosis. No regurgitation. - Mitral valve: Mildly thickened leaflets . Trivial regurgitation. - Left atrium: The atrium is severely dilated (29cm2). - Right ventricle: The cavity size was normal. Wall thickness was normal. AICDwire noted in right ventricle. Systolic function was normal. - Right atrium: The atrium was mildly dilated (22 cm2). AICD wire noted in right atrium. - Tricuspid valve: Poorly visualized. Mild regurgitation. - Pulmonary arteries: PA peak pressure: 26mm Hg (S). - Systemic veins: The  IVC was not visualized.      BNP    Component Value Date/Time   PROBNP 912.7* 02/24/2014 1910    Education Assessment and Provision:  Detailed education and instructions provided on heart failure disease management including the following:  Signs and symptoms of Heart Failure When to call the physician Importance of daily weights Low sodium diet  Fluid restriction Medication management Anticipated future follow-up appointments  Patient education given on each of the above topics.  Patient acknowledges understanding and acceptance of all instructions.  Mr. Weaver and his wife have received HF education previously and seemed very informed about HF education and instructions.  He does admit dietary indiscretion (likes olives, pickles, hot sauce and they frequently eat out socially).  I will request a dietician consult for further reinforcement on Low sodium diet.  Education Materials:  "Living Better With Heart Failure" Booklet, Daily Weight Tracker Tool and Heart Failure Educational Video.   High Risk Criteria for Readmission and/or Poor Patient Outcomes:  (Recommend Follow-up with [facility])--Yes   EF <  30%- Yes  2 or more admissions in 6 months- Yes  Difficult social situation- No  Demonstrates medication noncompliance- No (however, admits to not using Lasix if "going out" and then will forget to take upon returning to home)   Barriers of Care:  None noted  Discharge Planning:  Will discharge to home with wife.  He will benefit from [facility] for home transition and continued compliance reinforcement.

## 2014-02-25 NOTE — Progress Notes (Signed)
[facility] PROGRESS NOTE Interim History: 68 y.o. male, with a past medical history significant for morbid obesity, congestive heart failure with an EF of 30-35% (January 2014), AICD placement, CAD s/p CABG 1996,LBBB, COPD, obstructive sleep apnea, AAA repair (January 2015) who is admitted with acute on chronic HF.  Says for past few days has noted worsening SOB, LE edema, orthopnea and PND. Says he weighs himself most days of the week but doesn't know how much weight he has gained. Saw PCP yesterday and started abx and prednisone without improvement. Saw Dr. Debara Pickett today and referred to ER for HF. Has been using CPAP during day to help with breathing. Drinks 68 oz of Green Tea per day. Denies CP.  Presented to ED with marked respiratory distress. CXR c/w CHF. pBNP 672. Ph 7.4/40/71/94% on 2L. Given lasix and diuresed > 2L so far. Breathing better but still SOB at rest. No CP.   Filed Weights   02/24/14 1205 02/24/14 1740 02/25/14 0611  Weight: 120.657 kg (266 lb) 119.205 kg (262 lb 12.8 oz) 117.8 kg (259 lb 11.2 oz)        Intake/Output Summary (Last 24 hours) at 02/25/14 1042 Last data filed at 02/25/14 0900  Gross per 24 hour  Intake    898 ml  Output   3575 ml  Net  -2677 ml     Assessment/Plan: Acute on chronic systolic congestive heart failure, NYHA class 4 - started on IV lasix, good diuresis. - weight improving. Monitor electrolytes. - strict I and O's daily weights. - due to dietary noncompliance  Acute COPD exacerbation  - Patient reports bronchitis episode on 1/17.  - treated as an outpatient with steroids and antibiotics. - Cont levaquin 500 mg IV qd and Nebs,  OSA  Continue CPAP QHS  Oxygen during the day.   HTN  Controlled on Current Medications.   Recent AAA repair  January 2015.  Appears Stable.  HTN  Controlled on Current Medications.     Code Status: full Family Communication: wife  Disposition Plan:  inapteint   Consultants:  cardiology  Procedures: ECHO: none  Antibiotics:  levaquin 3.17.2015  HPI/Subjective: Feels better, still coughing Objective: Filed Vitals:   02/24/14 2245 02/25/14 0611 02/25/14 0814 02/25/14 1005  BP:  127/49  116/44  Pulse: 68 67  64  Temp:  98.7 F (37.1 C)    TempSrc:  Oral    Resp: 20 20    Height:      Weight:  117.8 kg (259 lb 11.2 oz)    SpO2: 95% 95% 95%      Exam:  General: Alert, awake, oriented x3, in no acute distress.  HEENT: No bruits, no goiter. +JVD Heart: Regular rate and rhythm, without murmurs, rubs, gallops.  Lungs: Good air movement, crackles on right Abdomen: Soft, nontender, nondistended, positive bowel sounds.   Data Reviewed: Basic Metabolic Panel:  Recent Labs Lab 02/24/14 1226 02/25/14 0001  NA 142 143  K 4.1 3.8  CL 104 100  CO2 24 27  GLUCOSE 154* 140*  BUN 19 20  CREATININE 0.71 0.88  CALCIUM 9.0 9.1  MG  --  2.0   Liver Function Tests:  Recent Labs Lab 02/24/14 1226  AST 23  ALT 21  ALKPHOS 70  BILITOT 0.4  PROT 6.7  ALBUMIN 3.3*   No results found for this basename: LIPASE, AMYLASE,  in the last 168 hours No results found for this basename: AMMONIA,  in the last 168 hours  CBC:  Recent Labs Lab 02/24/14 1226  WBC 4.8  NEUTROABS 4.1  HGB 12.2*  HCT 37.7*  MCV 91.5  PLT 134*   Cardiac Enzymes:  Recent Labs Lab 02/24/14 1910 02/24/14 2358 02/25/14 0520  TROPONINI <0.30 <0.30 <0.30   BNP (last 3 results)  Recent Labs  02/24/14 1226 02/24/14 1910  PROBNP 762.0* 912.7*   CBG:  Recent Labs Lab 02/24/14 1716 02/24/14 2129 02/25/14 0550  GLUCAP 122* 136* 94    No results found for this or any previous visit (from the past 240 hour(s)).   Studies: Dg Chest 2 View  02/24/2014   CLINICAL DATA:  SHORTNESS OF BREATH LEG SWELLING  EXAM: CHEST  2 VIEW  COMPARISON:  CT CTA ABD/PEL W/CM AND/OR W/O CM dated 02/13/2014; DG CHEST 1V PORT dated 01/06/2014  FINDINGS: The  cardiac silhouette is enlarged. Low lung volumes. A left pectoral as AICD with lead tip projecting in the region of the right ventricle. There is diffuse prominence of the interstitial markings, and a mild peribronchial cuffing. No focal regions of consolidation or focal infiltrates are appreciated. The osseous structures demonstrate no evidence of acute abnormalities. Patient is status post median sternotomy and coronary artery bypass grafting.  IMPRESSION: Interstitial infiltrate consistent with pulmonary edema. No focal regions of consolidation or focal infiltrates.   Electronically Signed   By: Margaree Mackintosh M.D.   On: 02/24/2014 12:58    Scheduled Meds: . amiodarone  200 mg Oral Daily  . aspirin EC  81 mg Oral Daily  . atorvastatin  40 mg Oral Daily  . carvedilol  25 mg Oral BID WC  . clopidogrel  75 mg Oral Daily  . enoxaparin (LOVENOX) injection  40 mg Subcutaneous Q24H  . finasteride  5 mg Oral Daily  . furosemide  80 mg Intravenous BID  . ipratropium-albuterol  3 mL Nebulization BID  . isosorbide mononitrate  30 mg Oral QHS  . isosorbide mononitrate  60 mg Oral q morning - 10a  . levofloxacin (LEVAQUIN) IV  500 mg Intravenous Q24H  . levothyroxine  50 mcg Oral QAC breakfast  . omega-3 acid ethyl esters  1 g Oral Daily  . potassium chloride  40 mEq Oral BID  . ramipril  2.5 mg Oral QPM  . ramipril  5 mg Oral q morning - 10a  . sodium chloride  3 mL Intravenous Q12H  . spironolactone  25 mg Oral Daily   Continuous Infusions:    Charlynne Cousins  [facility] Pager [phone] If 8PM-8AM, please contact night-coverage at [email or web address], password [facility] 02/25/2014, 10:42 AM  LOS: 1 day

## 2014-02-25 NOTE — Progress Notes (Signed)
Patient evaluated for community based chronic disease management services with [facility] as a benefit of patient's [employer]. Spoke with patient and spouse at bedside to explain [facility] services.  Written consents obtained with authorized contacts noted.  Patient will receive a post discharge transition of care call and will be evaluated for monthly home visits for assessments and CHF/COPD disease process education.  Patient requires addition education on identifying COPD triggers at home and when to activate his acute symptom management plan.  He admits to being slow to reach out for assistance in the past and would like to improve on his management.  Left contact information and [facility] literature at bedside. Made Inpatient Case Manager aware that [facility] following. Of note, [facility] Care Management services does not replace or interfere with any services that are arranged by inpatient case management or social work.  For additional questions or referrals please contact Corliss Blacker BSN RN [facility] Liaison at [phone].

## 2014-02-25 NOTE — Progress Notes (Signed)
Notification from central monitoring gotten of changes in rhythm from SR to afib, EKG gotten no changes reflected on EKG, VSS. DR. Baltazar Najjar notified no new orders.  We'll continue to monitor.

## 2014-02-26 DIAGNOSIS — R05 Cough: Secondary | ICD-10-CM

## 2014-02-26 DIAGNOSIS — J40 Bronchitis, not specified as acute or chronic: Secondary | ICD-10-CM

## 2014-02-26 DIAGNOSIS — R059 Cough, unspecified: Secondary | ICD-10-CM

## 2014-02-26 DIAGNOSIS — J441 Chronic obstructive pulmonary disease with (acute) exacerbation: Secondary | ICD-10-CM

## 2014-02-26 LAB — BASIC METABOLIC PANEL
BUN: 24 mg/dL — ABNORMAL HIGH (ref 6–23)
CO2: 26 mEq/L (ref 19–32)
Calcium: 9.3 mg/dL (ref 8.4–10.5)
Chloride: 103 mEq/L (ref 96–112)
Creatinine, Ser: 0.84 mg/dL (ref 0.50–1.35)
GFR calc Af Amer: >90 mL/min (ref >90)
GFR calc non Af Amer: 87 mL/min — ABNORMAL LOW (ref >90)
Glucose, Bld: 90 mg/dL (ref 70–99)
Potassium: 4 mEq/L (ref 3.7–5.3)
Sodium: 143 mEq/L (ref 137–147)

## 2014-02-26 MED ORDER — OXYMETAZOLINE HCL 0.05 % NA SOLN
1.0000 | Freq: Two times a day (BID) | NASAL | Status: DC
  Administered 2014-02-26 – 2014-03-03 (×11): 1 via NASAL
  Filled 2014-02-26: qty 15

## 2014-02-26 MED ORDER — RAMIPRIL 5 MG PO CAPS
5.0000 mg | ORAL_CAPSULE | Freq: Two times a day (BID) | ORAL | Status: DC
  Administered 2014-02-26 – 2014-03-03 (×11): 5 mg via ORAL
  Filled 2014-02-26 (×12): qty 1

## 2014-02-26 MED ORDER — DEXTROSE 5 % IV SOLN
10.0000 mg/h | INTRAVENOUS | Status: DC
  Administered 2014-02-26: 10 mg/h via INTRAVENOUS
  Filled 2014-02-26 (×5): qty 25

## 2014-02-26 MED ORDER — LEVOFLOXACIN 500 MG PO TABS
500.0000 mg | ORAL_TABLET | ORAL | Status: DC
  Administered 2014-02-26 – 2014-02-28 (×3): 500 mg via ORAL
  Filled 2014-02-26 (×5): qty 1

## 2014-02-26 NOTE — Plan of Care (Signed)
Problem: Phase II Progression Outcomes Goal: Walk in hall or up in chair TID Outcome: Progressing Ambulate with PT down the hall.  Tolerated well.

## 2014-02-26 NOTE — Progress Notes (Signed)
[facility] PROGRESS NOTE Interim History: 68 y.o. male, with a past medical history significant for morbid obesity, congestive heart failure with an EF of 30-35% (January 2014), AICD placement, CAD s/p CABG 1996,LBBB, COPD, obstructive sleep apnea, AAA repair (January 2015) who is admitted with acute on chronic HF.  Says for past few days has noted worsening SOB, LE edema, orthopnea and PND. Says he weighs himself most days of the week but doesn't know how much weight he has gained. Saw PCP yesterday and started abx and prednisone without improvement. Saw Dr. Debara Pickett today and referred to ER for HF. Has been using CPAP during day to help with breathing. Drinks 68 oz of Green Tea per day. Denies CP.  Presented to ED with marked respiratory distress. CXR c/w CHF. pBNP 672. Ph 7.4/40/71/94% on 2L. Given lasix and diuresed > 2L so far. Breathing better but still SOB at rest. No CP.   Filed Weights   02/24/14 1740 02/25/14 0611 02/26/14 0559  Weight: 119.205 kg (262 lb 12.8 oz) 117.8 kg (259 lb 11.2 oz) 117.3 kg (258 lb 9.6 oz)        Intake/Output Summary (Last 24 hours) at 02/26/14 0845 Last data filed at 02/26/14 0617  Gross per 24 hour  Intake   1358 ml  Output   2375 ml  Net  -1017 ml     Assessment/Plan: Acute on chronic systolic congestive heart failure, NYHA class 4 - sluggish diuresis with lasix IV,  start Lasix ggt. - Monitor electrolytes. - strict I and O's daily weights. estimated dry weight 240-245 lbs.(111.3 kg) - due to dietary noncompliance. - 121.1->117.8->117.3 kg  Acute COPD exacerbation  - Patient reports bronchitis episode on 1/17.  - treated as an outpatient with steroids and antibiotics. - Cont Levaquin orally.  OSA  Continue CPAP QHS  Oxygen during the day.   HTN  Controlled on Current Medications.   Recent AAA repair  January 2015.  Appears Stable.  HTN  Controlled on Current Medications.     Code Status: full Family Communication:  wife  Disposition Plan: inapteint   Consultants:  cardiology  Procedures: ECHO: none  Antibiotics:  levaquin 3.17.2015  HPI/Subjective:  Objective: Filed Vitals:   02/25/14 2056 02/25/14 2107 02/26/14 0559 02/26/14 0746  BP:   136/57   Pulse: 60 63 69   Temp:   98.3 F (36.8 C)   TempSrc:   Oral   Resp: 20 18 24    Height:      Weight:   117.3 kg (258 lb 9.6 oz)   SpO2: 97% 95% 97% 93%     Exam:  General: Alert, awake, oriented x3, in no acute distress.  HEENT: No bruits, no goiter. +JVD Heart: Regular rate and rhythm, without murmurs, rubs, gallops.  Lungs: Good air movement, crackles on right Abdomen: Soft, nontender, nondistended, positive bowel sounds.   Data Reviewed: Basic Metabolic Panel:  Recent Labs Lab 02/24/14 1226 02/25/14 0001 02/26/14 0455  NA 142 143 143  K 4.1 3.8 4.0  CL 104 100 103  CO2 24 27 26   GLUCOSE 154* 140* 90  BUN 19 20 24*  CREATININE 0.71 0.88 0.84  CALCIUM 9.0 9.1 9.3  MG  --  2.0  --    Liver Function Tests:  Recent Labs Lab 02/24/14 1226  AST 23  ALT 21  ALKPHOS 70  BILITOT 0.4  PROT 6.7  ALBUMIN 3.3*   No results found for this basename: LIPASE, AMYLASE,  in the  last 168 hours No results found for this basename: AMMONIA,  in the last 168 hours CBC:  Recent Labs Lab 02/24/14 1226  WBC 4.8  NEUTROABS 4.1  HGB 12.2*  HCT 37.7*  MCV 91.5  PLT 134*   Cardiac Enzymes:  Recent Labs Lab 02/24/14 1910 02/24/14 2358 02/25/14 0520  TROPONINI <0.30 <0.30 <0.30   BNP (last 3 results)  Recent Labs  02/24/14 1226 02/24/14 1910  PROBNP 762.0* 912.7*   CBG:  Recent Labs Lab 02/24/14 2129 02/25/14 0550 02/25/14 1105 02/25/14 1625 02/25/14 2101  GLUCAP 136* 94 112* 140* 142*    No results found for this or any previous visit (from the past 240 hour(s)).   Studies: Dg Chest 2 View  02/24/2014   CLINICAL DATA:  SHORTNESS OF BREATH LEG SWELLING  EXAM: CHEST  2 VIEW  COMPARISON:  CT CTA  ABD/PEL W/CM AND/OR W/O CM dated 02/13/2014; DG CHEST 1V PORT dated 01/06/2014  FINDINGS: The cardiac silhouette is enlarged. Low lung volumes. A left pectoral as AICD with lead tip projecting in the region of the right ventricle. There is diffuse prominence of the interstitial markings, and a mild peribronchial cuffing. No focal regions of consolidation or focal infiltrates are appreciated. The osseous structures demonstrate no evidence of acute abnormalities. Patient is status post median sternotomy and coronary artery bypass grafting.  IMPRESSION: Interstitial infiltrate consistent with pulmonary edema. No focal regions of consolidation or focal infiltrates.   Electronically Signed   By: Margaree Mackintosh M.D.   On: 02/24/2014 12:58    Scheduled Meds: . amiodarone  200 mg Oral Daily  . aspirin EC  81 mg Oral Daily  . atorvastatin  40 mg Oral Daily  . carvedilol  25 mg Oral BID WC  . clopidogrel  75 mg Oral Daily  . enoxaparin (LOVENOX) injection  40 mg Subcutaneous Q24H  . finasteride  5 mg Oral Daily  . furosemide  80 mg Intravenous BID  . ipratropium-albuterol  3 mL Nebulization BID  . isosorbide mononitrate  30 mg Oral QHS  . isosorbide mononitrate  60 mg Oral q morning - 10a  . levofloxacin (LEVAQUIN) IV  500 mg Intravenous Q24H  . levothyroxine  50 mcg Oral QAC breakfast  . omega-3 acid ethyl esters  1 g Oral Daily  . pantoprazole  40 mg Oral Daily  . potassium chloride  40 mEq Oral BID  . ramipril  2.5 mg Oral QPM  . ramipril  5 mg Oral q morning - 10a  . sodium chloride  3 mL Intravenous Q12H  . spironolactone  25 mg Oral Daily   Continuous Infusions:    Charlynne Cousins  [facility] Pager [phone] If 8PM-8AM, please contact night-coverage at [email or web address], password [facility] 02/26/2014, 8:45 AM  LOS: 2 days

## 2014-02-26 NOTE — Progress Notes (Signed)
ICD check in clinic (with [facility]). Normal device function. Threshold and sensing consistent with previous device measurements. Impedance trends stable over time. No evidence of any ventricular arrhythmias. Histogram distribution appropriate for patient and level of activity. No changes made this session. Device programmed at appropriate safety margins. Device programmed to optimize intrinsic conduction. Estimated longevity 9.5 years. Plan to follow up with [facility] in 3 months.

## 2014-02-26 NOTE — ED Provider Notes (Signed)
Medical screening examination/treatment/procedure(s) were performed by non-physician practitioner and as supervising physician I was immediately available for consultation/collaboration.   EKG Interpretation   Date/Time:  Tuesday February 24 2014 11:59:48 EDT Ventricular Rate:  60 PR Interval:  190 QRS Duration: 134 QT Interval:  508 QTC Calculation: 508 R Axis:   -42 Text Interpretation:  Sinus rhythm with occasional Premature ventricular  complexes and Possible Premature atrial complexes with [place] conduction  Left axis deviation Left bundle branch block Abnormal ECG new premature  ventricular contraction Confirmed by Rogene Houston  MD, Ceaira Ernster ([id number]) on  02/24/2014 3:04:30 PM        Mervin Kung, MD 02/26/14 1157

## 2014-02-26 NOTE — Plan of Care (Signed)
Problem: Food- and Nutrition-Related Knowledge Deficit (NB-1.1) Goal: Nutrition education Formal process to instruct or train a patient/client in a skill or to impart knowledge to help patients/clients voluntarily manage or modify food choices and eating behavior to maintain or improve health. Outcome: Completed/Met Date Met:  02/26/14 RD consult received for Diet Education on Heart Failure Nutrition Therapy and consumption of Low Sodium Foods.   Pt was admitted for SOB. Pt has CHF and explains that this is a new diagnosis for him. Pt follows regular diet at home and is aware of limiting sodium and fat intake due to heart conditions. Pt lives at home with his wife and they both cook meals.     Lab Results  Component Value Date    HGBA1C 5.9 06/18/2013   Lipid Panel     Component Value Date/Time    CHOL 116 06/18/2013 1033    TRIG 83.0 06/18/2013 1033    HDL 40.70 06/18/2013 1033    CHOLHDL 3 06/18/2013 1033    VLDL 16.6 06/18/2013 1033    LDLCALC 59 06/18/2013 1033   Heart Failure Nutrition Therapy handout from the [employer] was given to patient. Low sodium foods were reinforced to the patient. The patient explained that he was aware of the sodium content in foods and always reads the labels. Encouraged making meals at home rather than eating out at restaurants, and bringing his own food to social family events. Also discussed preparing foods in a way that does not include salt.   Used teachback. Expecting good compliance.  Patient on 2gm sodium diet.  Medications and labs reviewed.   Avel Peace, [facility]  Nutrition Intern       Intern note/chart reviewed. Revisions made.  [place], [place], [place] Pager [phone] After Hours Pager

## 2014-02-26 NOTE — Progress Notes (Signed)
RT placed patient on cpap via full face mask with 3lpm 02 bleed in. Patient is tolerating cpap well at this time. RT will continue to monitor.

## 2014-02-26 NOTE — Evaluation (Signed)
Physical Therapy Evaluation Patient Details Name: [name] MRN: [id number] DOB: 1946-10-20 Today's Date: 02/26/2014 Time: 7169-6789 PT Time Calculation (min): 45 min  PT Assessment / Plan / Recommendation History of Present Illness  [name]  is an extremely pleasant  68 y.o. male, with a past medical history significant for congestive heart failure with an EF of 30-35% (January 2014), AICD placement, COPD, obstructive sleep apnea, AAA repair (January 2015) who presents to the emergency department complaining of shortness of breath that started last Thursday. The patient reports coughing up green sputum, he went to see his primary care physician started him on Bactrim and prednisone. His symptoms did not improve  Clinical Impression  Pt very pleasant but very limited activity tolerance due to dyspnea. Dyspnea 3-4/4 with ambulation and unable to talk and walk at the same time. Pt with O2 sats remaining 97% throughout session on RA and with 3L O2 although pt reports he feels less SOB with supplemental O2. Pt noted to have PvCs throughout mobility up to 22/min at times when dyspnea the worst. Pt with decreased activity tolerance, decreased bil LE strength in hips and hamstrings and will benefit from acute therapy to maximize function and independence. Pt was independent until 2wks ago and has required assist from wife for ADLs since then. Pt educated for energy conservation and HEP including marching, standing hamstring curls, and heel/toe raises. Will follow acutely and recommend OP pulmonary rehab as pt stating he would not want HHPT if given HEP acutely.     PT Assessment  Patient needs continued PT services    Follow Up Recommendations  Outpatient PT pulmonary    Does the patient have the potential to tolerate intense rehabilitation      Barriers to Discharge        Equipment Recommendations  Rolling walker with 5" wheels;Other (comment) (rollator with seat and shower chair)     Recommendations for Other Services     Frequency Min 3X/week    Precautions / Restrictions Precautions Precautions: Fall   Pertinent Vitals/Pain HR 65 sats 97% throughout on RA and O2 3L No pain      Mobility  Transfers Overall transfer level: Modified independent Ambulation/Gait Ambulation/Gait assistance: Supervision Ambulation Distance (Feet): 75 Feet Assistive device: Rolling walker (2 wheeled) Gait Pattern/deviations: Step-through pattern;Decreased stride length Gait velocity interpretation: <1.8 ft/sec, indicative of risk for recurrent falls General Gait Details: pt with slow gait with cues for position in RW with standing rest half way to be able to continue gait    Exercises     PT Diagnosis: Difficulty walking  PT Problem List: Decreased activity tolerance;Decreased knowledge of use of DME PT Treatment Interventions: Gait training;Stair training;Functional mobility training;Therapeutic activities;Therapeutic exercise;Patient/family education;DME instruction     PT Goals(Current goals can be found in the care plan section) Acute Rehab PT Goals Patient Stated Goal: "be able to be a normal person" PT Goal Formulation: With patient/family Time For Goal Achievement: 03/12/14 Potential to Achieve Goals: Good  Visit Information  Last PT Received On: 02/26/14 Assistance Needed: +1 History of Present Illness: [name]  is an extremely pleasant  68 y.o. male, with a past medical history significant for congestive heart failure with an EF of 30-35% (January 2014), AICD placement, COPD, obstructive sleep apnea, AAA repair (January 2015) who presents to the emergency department complaining of shortness of breath that started last Thursday. The patient reports coughing up green sputum, he went to see his primary care physician started him on  Bactrim and prednisone. His symptoms did not improve       Prior [place] expects to be discharged  to:: Private residence Living Arrangements: Spouse/significant other Available Help at Discharge: Family;Available 24 hours/day Type of Home: House Home Access: Stairs to enter [employer] of Steps: 4 Entrance Stairs-Rails: Left;Right;Can reach both Home Layout: One level Home Equipment: None Prior Function Level of Independence: Independent Comments: spouse has been helping with ADLs for last week due to SOB Communication Communication: No difficulties    Cognition  Cognition Arousal/Alertness: Awake/alert Behavior During Therapy: WFL for tasks assessed/performed Overall Cognitive Status: Within Functional Limits for tasks assessed    Extremity/Trunk Assessment Upper Extremity Assessment Upper Extremity Assessment: Overall WFL for tasks assessed Lower Extremity Assessment Lower Extremity Assessment: RLE deficits/detail;LLE deficits/detail RLE Deficits / Details: hip flexion 3+/5, knee flexion 4/5, knee extension 5/5 LLE Deficits / Details: hip flexion 3+/5, knee flexion 4/5, knee extension 5/5 Cervical / Trunk Assessment Cervical / Trunk Assessment: Normal   Balance    End of Session PT - End of Session Equipment Utilized During Treatment: Oxygen Activity Tolerance: Patient limited by fatigue Patient left: in chair;with call bell/phone within reach;with family/visitor present  GP     Lanetta Inch Beth 02/26/2014, 2:23 PM Elwyn Reach, [place]

## 2014-02-26 NOTE — Progress Notes (Signed)
Patient Name: [name] Date of Encounter: 02/26/2014     Principal Problem:   Acute on chronic systolic congestive heart failure, NYHA class 4 Active Problems:   HYPERTENSION   CAD   COPD, moderately severe   OSA on CPAP   Hyperlipidemia LDL goal < 70   Cardiomyopathy, ischemic   PAD (peripheral artery disease)   AICD (automatic cardioverter/defibrillator) present    SUBJECTIVE  Patient feels better this morning.  Feels like he can take a deeper breath.  Denies chest pain.  Rhythm regular normal sinus rhythm with frequent PVCs  CURRENT MEDS . amiodarone  200 mg Oral Daily  . aspirin EC  81 mg Oral Daily  . atorvastatin  40 mg Oral Daily  . carvedilol  25 mg Oral BID WC  . clopidogrel  75 mg Oral Daily  . enoxaparin (LOVENOX) injection  40 mg Subcutaneous Q24H  . finasteride  5 mg Oral Daily  . ipratropium-albuterol  3 mL Nebulization BID  . isosorbide mononitrate  30 mg Oral QHS  . isosorbide mononitrate  60 mg Oral q morning - 10a  . levofloxacin  500 mg Oral Q24H  . levothyroxine  50 mcg Oral QAC breakfast  . omega-3 acid ethyl esters  1 g Oral Daily  . oxymetazoline  1 spray Each Nare BID  . pantoprazole  40 mg Oral Daily  . potassium chloride  40 mEq Oral BID  . ramipril  5 mg Oral BID  . sodium chloride  3 mL Intravenous Q12H  . spironolactone  25 mg Oral Daily    OBJECTIVE  Filed Vitals:   02/26/14 0559 02/26/14 0746 02/26/14 0900 02/26/14 0948  BP: 136/57  136/50 132/70  Pulse: 69  65   Temp: 98.3 F (36.8 C)  97.4 F (36.3 C)   TempSrc: Oral  Oral   Resp: 24  24   Height:      Weight: 258 lb 9.6 oz (117.3 kg)     SpO2: 97% 93% 94%     Intake/Output Summary (Last 24 hours) at 02/26/14 1037 Last data filed at 02/26/14 0911  Gross per 24 hour  Intake   1238 ml  Output   2825 ml  Net  -1587 ml   Filed Weights   02/24/14 1740 02/25/14 0611 02/26/14 0559  Weight: 262 lb 12.8 oz (119.205 kg) 259 lb 11.2 oz (117.8 kg) 258 lb 9.6 oz (117.3  kg)    PHYSICAL EXAM  General: Pleasant, NAD. Neuro: Alert and oriented X 3. Moves all extremities spontaneously. Psych: Normal affect. HEENT:  Normal  Neck: Supple without bruits or JVD. Lungs:  Diminished breath sounds at bases Heart: RRR no s3, s4, or murmurs. Abdomen: Soft, non-tender, non-distended, BS + x 4.  Extremities: No clubbing, cyanosis.  Moderate bilateral pretibial and pedal edema  Accessory Clinical Findings  CBC  Recent Labs  02/24/14 1226  WBC 4.8  NEUTROABS 4.1  HGB 12.2*  HCT 37.7*  MCV 91.5  PLT 329*   Basic Metabolic Panel  Recent Labs  02/25/14 0001 02/26/14 0455  NA 143 143  K 3.8 4.0  CL 100 103  CO2 27 26  GLUCOSE 140* 90  BUN 20 24*  CREATININE 0.88 0.84  CALCIUM 9.1 9.3  MG 2.0  --    Liver Function Tests  Recent Labs  02/24/14 1226  AST 23  ALT 21  ALKPHOS 70  BILITOT 0.4  PROT 6.7  ALBUMIN 3.3*   No results found  for this basename: LIPASE, AMYLASE,  in the last 72 hours Cardiac Enzymes  Recent Labs  02/24/14 1910 02/24/14 2358 02/25/14 0520  TROPONINI <0.30 <0.30 <0.30   BNP No components found with this basename: POCBNP,  D-Dimer No results found for this basename: DDIMER,  in the last 72 hours Hemoglobin A1C No results found for this basename: HGBA1C,  in the last 72 hours Fasting Lipid Panel No results found for this basename: CHOL, HDL, LDLCALC, TRIG, CHOLHDL, LDLDIRECT,  in the last 72 hours Thyroid Function Tests No results found for this basename: TSH, T4TOTAL, FREET3, T3FREE, THYROIDAB,  in the last 72 hours  TELE  Normal sinus rhythm with PVCs  ECG    Radiology/Studies  Dg Chest 2 View  02/24/2014   CLINICAL DATA:  SHORTNESS OF BREATH LEG SWELLING  EXAM: CHEST  2 VIEW  COMPARISON:  CT CTA ABD/PEL W/CM AND/OR W/O CM dated 02/13/2014; DG CHEST 1V PORT dated 01/06/2014  FINDINGS: The cardiac silhouette is enlarged. Low lung volumes. A left pectoral as AICD with lead tip projecting in the region  of the right ventricle. There is diffuse prominence of the interstitial markings, and a mild peribronchial cuffing. No focal regions of consolidation or focal infiltrates are appreciated. The osseous structures demonstrate no evidence of acute abnormalities. Patient is status post median sternotomy and coronary artery bypass grafting.  IMPRESSION: Interstitial infiltrate consistent with pulmonary edema. No focal regions of consolidation or focal infiltrates.   Electronically Signed   By: Margaree Mackintosh M.D.   On: 02/24/2014 12:58   Ct Angio Abd/pel W/ And/or W/o  02/13/2014   CLINICAL DATA:  Abdominal aortic aneurysm stent graft repair 01/06/2014  EXAM: CT ANGIOGRAPHY ABDOMEN AND PELVIS WITH CONTRAST AND WITHOUT CONTRAST  TECHNIQUE: Multidetector CT imaging of the abdomen and pelvis was performed using the standard protocol during bolus administration of intravenous contrast. Multiplanar reconstructed images and MIPs were obtained and reviewed to evaluate the vascular anatomy.  CONTRAST:  33mL OMNIPAQUE IOHEXOL 350 MG/ML SOLN  COMPARISON:  11/05/2013  FINDINGS: Vascular: Patient is status post aortic stent graft repair of the abdominal aortic aneurysm. Bifurcated stent graft extends from below the renal arteries into the common iliac arteries bilaterally. Negative for endoleak. Measuring at a similar level as before, the native aneurysm sac is stable measuring 5.8 x 4.9 cm.  Celiac, SMA, and renal arteries all remain patent. Patient is status post previous left renal and bilateral iliac stents. There is slight narrowing of the graft lumens at the bifurcation, suspect related to the prior iliac stents. No evidence of graft occlusion or thrombus. Iliac limbs terminate in the common iliac arteries. The common, internal and external iliac arteries remain patent. In the inguinal regions, the common femoral, proximal profunda femoral, and proximal superficial femoral arteries appear patent.  Nonvascular: Clear lung  bases. Liver, gallbladder, biliary system, pancreas, spleen, adrenal glands, and kidneys are within normal limits for age and arterial phase imaging.  Negative for bowel obstruction, dilatation, ileus, or free air.  No abdominal free fluid, fluid collection, hemorrhage, abscess, or adenopathy.  Normal appendix demonstrated.  No pelvic free fluid, fluid collection, hemorrhage, abscess, adenopathy, inguinal abnormality, or hernia. No acute distal bowel process. Degenerative changes of the spine and pelvis. No acute osseous finding.  Review of the MIP images confirms the above findings.  IMPRESSION: Status post aortic stent graft repair of the abdominal aortic aneurysm. Stable native aneurysm sac diameter.  Negative for endoleak or acute finding.   Electronically Signed  By: Daryll Brod M.D.   On: 02/13/2014 12:11    ASSESSMENT AND PLAN  1. Acute on chronic systolic chf/ICM: Sluggish diuresis yesterday. Lost only 1 lb. Now on IV lasix drip. 2. CAD: No chest pain. Trop normal. Cont asa, statin, bb, plavix, nitrate.  3. HL: On statin. LFT's wnl.  4. HTN: Stable.   Signed, Darlin Coco MD

## 2014-02-26 NOTE — Progress Notes (Signed)
Pt c/o nasal congestion.  Requesting nasal spray.  Dr. Faylene Kurtz  informed and ordered nasal spray this afternoon.  Will continue to monitor.  Karie Kirks, Therapist, sports.

## 2014-02-27 DIAGNOSIS — R609 Edema, unspecified: Secondary | ICD-10-CM

## 2014-02-27 LAB — BASIC METABOLIC PANEL
BUN: 30 mg/dL — ABNORMAL HIGH (ref 6–23)
CO2: 26 mEq/L (ref 19–32)
Calcium: 9.6 mg/dL (ref 8.4–10.5)
Chloride: 96 mEq/L (ref 96–112)
Creatinine, Ser: 1.07 mg/dL (ref 0.50–1.35)
GFR calc Af Amer: 80 mL/min — ABNORMAL LOW (ref >90)
GFR calc non Af Amer: 69 mL/min — ABNORMAL LOW (ref >90)
Glucose, Bld: 100 mg/dL — ABNORMAL HIGH (ref 70–99)
Potassium: 4.2 mEq/L (ref 3.7–5.3)
Sodium: 138 mEq/L (ref 137–147)

## 2014-02-27 NOTE — Progress Notes (Signed)
Pt having some bigeminy rhythm at this time, VSS, no distress noticed, pt still on Lasix gtt as ordered.

## 2014-02-27 NOTE — Progress Notes (Signed)
[facility] PROGRESS NOTE Interim History: 68 y.o. male, with a past medical history significant for morbid obesity, congestive heart failure with an EF of 30-35% (January 2014), AICD placement, CAD s/p CABG 1996,LBBB, COPD, obstructive sleep apnea, AAA repair (January 2015) who is admitted with acute on chronic HF.  Says for past few days has noted worsening SOB, LE edema, orthopnea and PND. Says he weighs himself most days of the week but doesn't know how much weight he has gained. Saw PCP yesterday and started abx and prednisone without improvement. Saw Dr. Debara Pickett today and referred to ER for HF. Has been using CPAP during day to help with breathing. Drinks 68 oz of Green Tea per day. Denies CP.  Presented to ED with marked respiratory distress. CXR c/w CHF. pBNP 672. Ph 7.4/40/71/94% on 2L. Given lasix and diuresed > 2L so far. Breathing better but still SOB at rest. No CP.   Filed Weights   02/25/14 0611 02/26/14 0559 02/27/14 0640  Weight: 117.8 kg (259 lb 11.2 oz) 117.3 kg (258 lb 9.6 oz) 114.6 kg (252 lb 10.4 oz)        Intake/Output Summary (Last 24 hours) at 02/27/14 1004 Last data filed at 02/27/14 0824  Gross per 24 hour  Intake    716 ml  Output   4450 ml  Net  -3734 ml     Assessment/Plan: Acute on chronic systolic congestive heart failure, NYHA class 4 - Sluggish diuresis with lasix IV,  cont Lasix ggt. - Monitor electrolytes. - strict I and O's daily weights. estimated dry weight 240-245 lbs.(111.3 kg) - due to dietary noncompliance. - 121.1->117.8->117.3-> 114. kg  Acute COPD exacerbation  - Patient reports bronchitis episode on 1/17.  - treated as an outpatient with steroids and antibiotics. - Cont Levaquin orally.  OSA  Continue CPAP QHS  Oxygen during the day.   HTN  Controlled on Current Medications.   Recent AAA repair  January 2015.  Appears Stable.  HTN  Controlled on Current Medications.     Code Status: full Family Communication:  wife  Disposition Plan: inapteint   Consultants:  cardiology  Procedures: ECHO: none  Antibiotics:  levaquin 3.17.2015  HPI/Subjective: No complains Objective: Filed Vitals:   02/26/14 2018 02/26/14 2137 02/27/14 0156 02/27/14 0640  BP: 136/86   129/61  Pulse: 54  52 63  Temp: 98.4 F (36.9 C)  97.8 F (36.6 C) 97.7 F (36.5 C)  TempSrc: Oral  Oral Oral  Resp: 20   18  Height:      Weight:    114.6 kg (252 lb 10.4 oz)  SpO2: 97% 95%  91%     Exam:  General: Alert, awake, oriented x3, in no acute distress.  HEENT: No bruits, no goiter. +JVD Heart: Regular rate and rhythm, without murmurs, rubs, gallops.  Lungs: Good air movement, clear Abdomen: Soft, nontender, nondistended, positive bowel sounds.   Data Reviewed: Basic Metabolic Panel:  Recent Labs Lab 02/24/14 1226 02/25/14 0001 02/26/14 0455 02/27/14 0552  NA 142 143 143 138  K 4.1 3.8 4.0 4.2  CL 104 100 103 96  CO2 24 27 26 26   GLUCOSE 154* 140* 90 100*  BUN 19 20 24* 30*  CREATININE 0.71 0.88 0.84 1.07  CALCIUM 9.0 9.1 9.3 9.6  MG  --  2.0  --   --    Liver Function Tests:  Recent Labs Lab 02/24/14 1226  AST 23  ALT 21  ALKPHOS 70  BILITOT  0.4  PROT 6.7  ALBUMIN 3.3*   No results found for this basename: LIPASE, AMYLASE,  in the last 168 hours No results found for this basename: AMMONIA,  in the last 168 hours CBC:  Recent Labs Lab 02/24/14 1226  WBC 4.8  NEUTROABS 4.1  HGB 12.2*  HCT 37.7*  MCV 91.5  PLT 134*   Cardiac Enzymes:  Recent Labs Lab 02/24/14 1910 02/24/14 2358 02/25/14 0520  TROPONINI <0.30 <0.30 <0.30   BNP (last 3 results)  Recent Labs  02/24/14 1226 02/24/14 1910  PROBNP 762.0* 912.7*   CBG:  Recent Labs Lab 02/24/14 2129 02/25/14 0550 02/25/14 1105 02/25/14 1625 02/25/14 2101  GLUCAP 136* 94 112* 140* 142*    No results found for this or any previous visit (from the past 240 hour(s)).   Studies: No results found.  Scheduled  Meds: . amiodarone  200 mg Oral Daily  . aspirin EC  81 mg Oral Daily  . atorvastatin  40 mg Oral Daily  . carvedilol  25 mg Oral BID WC  . clopidogrel  75 mg Oral Daily  . enoxaparin (LOVENOX) injection  40 mg Subcutaneous Q24H  . finasteride  5 mg Oral Daily  . ipratropium-albuterol  3 mL Nebulization BID  . isosorbide mononitrate  30 mg Oral QHS  . isosorbide mononitrate  60 mg Oral q morning - 10a  . levofloxacin  500 mg Oral Q24H  . levothyroxine  50 mcg Oral QAC breakfast  . omega-3 acid ethyl esters  1 g Oral Daily  . oxymetazoline  1 spray Each Nare BID  . pantoprazole  40 mg Oral Daily  . potassium chloride  40 mEq Oral BID  . ramipril  5 mg Oral BID  . sodium chloride  3 mL Intravenous Q12H  . spironolactone  25 mg Oral Daily   Continuous Infusions: . furosemide (LASIX) infusion 10 mg/hr (02/26/14 0954)     Charlynne Cousins  [facility] Pager [phone] If 8PM-8AM, please contact night-coverage at [email or web address], password [facility] 02/27/2014, 10:04 AM  LOS: 3 days

## 2014-02-27 NOTE — Progress Notes (Signed)
Patient placed on CPAP, tolerating well. RT will continue to monitor.

## 2014-02-27 NOTE — Progress Notes (Signed)
Patient Name: [name] Date of Encounter: 02/27/2014   Principal Problem:   Acute on chronic systolic congestive heart failure, NYHA class 4 Active Problems:   Cardiomyopathy, ischemic   HYPERTENSION   CAD   COPD, moderately severe   OSA on CPAP   Hyperlipidemia LDL goal < 70   PAD (peripheral artery disease)   AICD (automatic cardioverter/defibrillator) present   SUBJECTIVE  Breathing steadily improving.  "I feel human again."  Renal fxn stable.  CURRENT MEDS . amiodarone  200 mg Oral Daily  . aspirin EC  81 mg Oral Daily  . atorvastatin  40 mg Oral Daily  . carvedilol  25 mg Oral BID WC  . clopidogrel  75 mg Oral Daily  . enoxaparin (LOVENOX) injection  40 mg Subcutaneous Q24H  . finasteride  5 mg Oral Daily  . ipratropium-albuterol  3 mL Nebulization BID  . isosorbide mononitrate  30 mg Oral QHS  . isosorbide mononitrate  60 mg Oral q morning - 10a  . levofloxacin  500 mg Oral Q24H  . levothyroxine  50 mcg Oral QAC breakfast  . omega-3 acid ethyl esters  1 g Oral Daily  . oxymetazoline  1 spray Each Nare BID  . pantoprazole  40 mg Oral Daily  . potassium chloride  40 mEq Oral BID  . ramipril  5 mg Oral BID  . sodium chloride  3 mL Intravenous Q12H  . spironolactone  25 mg Oral Daily   OBJECTIVE  Filed Vitals:   02/26/14 2137 02/27/14 0156 02/27/14 0640 02/27/14 1034  BP:   129/61 128/67  Pulse:  52 63   Temp:  97.8 F (36.6 C) 97.7 F (36.5 C)   TempSrc:  Oral Oral   Resp:   18   Height:      Weight:   252 lb 10.4 oz (114.6 kg)   SpO2: 95%  91%     Intake/Output Summary (Last 24 hours) at 02/27/14 1237 Last data filed at 02/27/14 0824  Gross per 24 hour  Intake    716 ml  Output   3950 ml  Net  -3234 ml   Filed Weights   02/25/14 0611 02/26/14 0559 02/27/14 0640  Weight: 259 lb 11.2 oz (117.8 kg) 258 lb 9.6 oz (117.3 kg) 252 lb 10.4 oz (114.6 kg)   PHYSICAL EXAM  General: Pleasant, NAD. Neuro: Alert and oriented X 3. Moves all  extremities spontaneously. Psych: Normal affect. HEENT:  Normal  Neck: Supple without bruits.  Mod elevated JVP. Lungs:  Resp regular and unlabored, diminished R>L with occas exp wheeze and few basilar crackles. Heart: RRR, distant, no s3, s4, or murmurs. Abdomen: Soft, non-tender, non-distended, BS + x 4.  Extremities: No clubbing, cyanosis.  1+ bilat LE edema. DP/PT/Radials 1+ and equal bilaterally.  Accessory Clinical Findings  Basic Metabolic Panel  Recent Labs  02/25/14 0001 02/26/14 0455 02/27/14 0552  NA 143 143 138  K 3.8 4.0 4.2  CL 100 103 96  CO2 27 26 26   GLUCOSE 140* 90 100*  BUN 20 24* 30*  CREATININE 0.88 0.84 1.07  CALCIUM 9.1 9.3 9.6  MG 2.0  --   --    Cardiac Enzymes  Recent Labs  02/24/14 1910 02/24/14 2358 02/25/14 0520  TROPONINI <0.30 <0.30 <0.30   TELE  Rsr, freq pvc's.  Radiology/Studies  Dg Chest 2 View  02/24/2014   CLINICAL DATA:  SHORTNESS OF BREATH LEG SWELLING  EXAM: CHEST  2 VIEW  IMPRESSION: Interstitial infiltrate consistent with pulmonary edema. No focal regions of consolidation or focal infiltrates.   Electronically Signed   By: Margaree Mackintosh M.D.   On: 02/24/2014 12:58   ASSESSMENT AND PLAN  1.  Acute on chronic systolic chf/ICM:   Feeling much better and responding well to lasix gtt.  -3.9 L yesterday, -7.9 L since admission.  Wt down 6 lbs to 252.  Says that he usually feels his best @ a dry weight in the 230's.  Says he was 238 before he started putting on all this fluid.  Cont lasix gtt.  Renal fxn stable.  Cont bb/acei/spiro.  2. CAD: No chest pain. Trop normal. Cont asa, statin, bb, plavix, nitrate.   3. HL: On statin. LFT's wnl.   4. HTN: Stable.  5.  COPD/Bronchitis:  On levaquin.  Not actively wheezing.  Signed, Murray Hodgkins NP Agree with assessment as noted above.  Patient is having a good response to Lasix drip.  Total weight loss of 14 pounds so far this admission.  Edema has improved significantly.   Abdominal distention is less.  Dyspnea has improved.  Telemetry shows normal sinus rhythm with left bundle branch block and frequent PVCs.

## 2014-02-28 DIAGNOSIS — J069 Acute upper respiratory infection, unspecified: Secondary | ICD-10-CM

## 2014-02-28 DIAGNOSIS — J449 Chronic obstructive pulmonary disease, unspecified: Secondary | ICD-10-CM

## 2014-02-28 LAB — BASIC METABOLIC PANEL
BUN: 39 mg/dL — ABNORMAL HIGH (ref 6–23)
CO2: 27 mEq/L (ref 19–32)
Calcium: 9.8 mg/dL (ref 8.4–10.5)
Chloride: 93 mEq/L — ABNORMAL LOW (ref 96–112)
Creatinine, Ser: 1.26 mg/dL (ref 0.50–1.35)
GFR calc Af Amer: 65 mL/min — ABNORMAL LOW (ref >90)
GFR calc non Af Amer: 57 mL/min — ABNORMAL LOW (ref >90)
Glucose, Bld: 123 mg/dL — ABNORMAL HIGH (ref 70–99)
Potassium: 4.7 mEq/L (ref 3.7–5.3)
Sodium: 133 mEq/L — ABNORMAL LOW (ref 137–147)

## 2014-02-28 MED ORDER — FUROSEMIDE 80 MG PO TABS
80.0000 mg | ORAL_TABLET | Freq: Two times a day (BID) | ORAL | Status: DC
  Filled 2014-02-28 (×3): qty 1

## 2014-02-28 MED ORDER — ISOSORBIDE MONONITRATE 15 MG HALF TABLET
15.0000 mg | ORAL_TABLET | Freq: Every day | ORAL | Status: DC
  Administered 2014-02-28 – 2014-03-02 (×3): 15 mg via ORAL
  Filled 2014-02-28 (×4): qty 1

## 2014-02-28 MED ORDER — FUROSEMIDE 10 MG/ML IJ SOLN
4.0000 mg/h | INTRAMUSCULAR | Status: DC
  Administered 2014-02-28 (×2): 4 mg/h via INTRAVENOUS
  Filled 2014-02-28 (×2): qty 25

## 2014-02-28 MED ORDER — METOLAZONE 2.5 MG PO TABS
2.5000 mg | ORAL_TABLET | Freq: Once | ORAL | Status: AC
  Administered 2014-02-28: 2.5 mg via ORAL
  Filled 2014-02-28: qty 1

## 2014-02-28 NOTE — Progress Notes (Signed)
   SUBJECTIVE:  Breathing better.  No pain   PHYSICAL EXAM Filed Vitals:   02/28/14 0532 02/28/14 0953 02/28/14 1008 02/28/14 1025  BP: 135/56 123/40 136/67   Pulse: 63 52    Temp: 97.9 F (36.6 C) 97.3 F (36.3 C)    TempSrc: Oral Oral    Resp: 18 20    Height:      Weight: 252 lb 4.8 oz (114.443 kg)     SpO2: 92% 93%  94%   General:  No distress Lungs:  Clear Heart:  RRR Abdomen:  Positive bowel sounds, no rebound no guarding Extremities:  Mild edema  LABS:  No results found for this or any previous visit (from the past 24 hour(s)).  Intake/Output Summary (Last 24 hours) at 02/28/14 1136 Last data filed at 02/28/14 0900  Gross per 24 hour  Intake   1361 ml  Output   2600 ml  Net  -1239 ml    ASSESSMENT AND PLAN:  ACUTE ON CHRONIC CHF: Weight is down and almost at dry weight.  Good UO.  Labs pending today.  Check BMET in AM.  Continue current diuresis.    CAD:  Continue medical management  HTN:  BP OK.  Continue current therapy.    Jeneen Rinks [facility] 02/28/2014 11:36 AM

## 2014-02-28 NOTE — Progress Notes (Signed)
Patient placed on CPAP at this time. Tolerating well, RT will continue to monitor.

## 2014-02-28 NOTE — Progress Notes (Signed)
[facility] PROGRESS NOTE Interim History: 68 y.o. male, with a past medical history significant for morbid obesity, congestive heart failure with an EF of 30-35% (January 2014), AICD placement, CAD s/p CABG 1996,LBBB, COPD, obstructive sleep apnea, AAA repair (January 2015) who is admitted with acute on chronic HF.  Says for past few days has noted worsening SOB, LE edema, orthopnea and PND. Says he weighs himself most days of the week but doesn't know how much weight he has gained. Saw PCP yesterday and started abx and prednisone without improvement. Saw Dr. Debara Pickett today and referred to ER for HF. Has been using CPAP during day to help with breathing. Drinks 68 oz of Green Tea per day. Denies CP.  Presented to ED with marked respiratory distress. CXR c/w CHF. pBNP 672. Ph 7.4/40/71/94% on 2L. Given lasix and diuresed > 2L so far. Breathing better but still SOB at rest. No CP.   Filed Weights   02/26/14 0559 02/27/14 0640 02/28/14 0532  Weight: 117.3 kg (258 lb 9.6 oz) 114.6 kg (252 lb 10.4 oz) 114.443 kg (252 lb 4.8 oz)        Intake/Output Summary (Last 24 hours) at 02/28/14 0853 Last data filed at 02/28/14 0747  Gross per 24 hour  Intake   1121 ml  Output   2300 ml  Net  -1179 ml     Assessment/Plan: Acute on chronic systolic congestive heart failure, NYHA class 4 - Cont Lasix ggt. - Monitor electrolytes. - strict I and O's daily weights. estimated dry weight 240-245 lbs.(111.3 kg) - due to dietary noncompliance. Basic metabolic panel pending. - 121.1->117.8->117.3-> 114. kg  Acute COPD exacerbation  - Patient reports bronchitis episode on 1/17.  - treated as an outpatient with steroids and antibiotics. - Cont Levaquin orally.  OSA  Continue CPAP QHS  Oxygen during the day.   HTN  Controlled on Current Medications.   Recent AAA repair  January 2015.  Appears Stable.  HTN  Controlled on Current Medications.     Code Status: full Family Communication:  wife  Disposition Plan: inapteint   Consultants:  cardiology  Procedures: ECHO: none  Antibiotics:  levaquin 3.17.2015  HPI/Subjective: No complains Objective: Filed Vitals:   02/27/14 1423 02/27/14 2235 02/27/14 2250 02/28/14 0532  BP: 120/61  109/45 135/56  Pulse: 63  67 63  Temp: 97.9 F (36.6 C)  97.9 F (36.6 C) 97.9 F (36.6 C)  TempSrc: Oral  Oral Oral  Resp: 18   18  Height:      Weight:    114.443 kg (252 lb 4.8 oz)  SpO2: 97% 98% 94% 92%     Exam:  General: Alert, awake, oriented x3, in no acute distress.  HEENT: No bruits, no goiter. +JVD Heart: Regular rate and rhythm, lower extremity edema.  Lungs: Good air movement, clear Abdomen: Soft, nontender, nondistended, positive bowel sounds.   Data Reviewed: Basic Metabolic Panel:  Recent Labs Lab 02/24/14 1226 02/25/14 0001 02/26/14 0455 02/27/14 0552  NA 142 143 143 138  K 4.1 3.8 4.0 4.2  CL 104 100 103 96  CO2 24 27 26 26   GLUCOSE 154* 140* 90 100*  BUN 19 20 24* 30*  CREATININE 0.71 0.88 0.84 1.07  CALCIUM 9.0 9.1 9.3 9.6  MG  --  2.0  --   --    Liver Function Tests:  Recent Labs Lab 02/24/14 1226  AST 23  ALT 21  ALKPHOS 70  BILITOT 0.4  PROT 6.7  ALBUMIN 3.3*   No results found for this basename: LIPASE, AMYLASE,  in the last 168 hours No results found for this basename: AMMONIA,  in the last 168 hours CBC:  Recent Labs Lab 02/24/14 1226  WBC 4.8  NEUTROABS 4.1  HGB 12.2*  HCT 37.7*  MCV 91.5  PLT 134*   Cardiac Enzymes:  Recent Labs Lab 02/24/14 1910 02/24/14 2358 02/25/14 0520  TROPONINI <0.30 <0.30 <0.30   BNP (last 3 results)  Recent Labs  02/24/14 1226 02/24/14 1910  PROBNP 762.0* 912.7*   CBG:  Recent Labs Lab 02/24/14 2129 02/25/14 0550 02/25/14 1105 02/25/14 1625 02/25/14 2101  GLUCAP 136* 94 112* 140* 142*    No results found for this or any previous visit (from the past 240 hour(s)).   Studies: No results found.  Scheduled  Meds: . amiodarone  200 mg Oral Daily  . aspirin EC  81 mg Oral Daily  . atorvastatin  40 mg Oral Daily  . carvedilol  25 mg Oral BID WC  . clopidogrel  75 mg Oral Daily  . enoxaparin (LOVENOX) injection  40 mg Subcutaneous Q24H  . finasteride  5 mg Oral Daily  . ipratropium-albuterol  3 mL Nebulization BID  . isosorbide mononitrate  30 mg Oral QHS  . isosorbide mononitrate  60 mg Oral q morning - 10a  . levofloxacin  500 mg Oral Q24H  . levothyroxine  50 mcg Oral QAC breakfast  . omega-3 acid ethyl esters  1 g Oral Daily  . oxymetazoline  1 spray Each Nare BID  . pantoprazole  40 mg Oral Daily  . potassium chloride  40 mEq Oral BID  . ramipril  5 mg Oral BID  . sodium chloride  3 mL Intravenous Q12H  . spironolactone  25 mg Oral Daily   Continuous Infusions: . furosemide (LASIX) infusion 10 mg/hr (02/26/14 0954)     Charlynne Cousins  [facility] Pager [phone] If 8PM-8AM, please contact night-coverage at [email or web address], password [facility] 02/28/2014, 8:53 AM  LOS: 4 days

## 2014-03-01 DIAGNOSIS — R0989 Other specified symptoms and signs involving the circulatory and respiratory systems: Secondary | ICD-10-CM

## 2014-03-01 DIAGNOSIS — R0609 Other forms of dyspnea: Secondary | ICD-10-CM

## 2014-03-01 LAB — BASIC METABOLIC PANEL
BUN: 53 mg/dL — ABNORMAL HIGH (ref 6–23)
CO2: 28 mEq/L (ref 19–32)
Calcium: 10.2 mg/dL (ref 8.4–10.5)
Chloride: 90 mEq/L — ABNORMAL LOW (ref 96–112)
Creatinine, Ser: 1.53 mg/dL — ABNORMAL HIGH (ref 0.50–1.35)
GFR calc Af Amer: 52 mL/min — ABNORMAL LOW (ref >90)
GFR calc non Af Amer: 45 mL/min — ABNORMAL LOW (ref >90)
Glucose, Bld: 113 mg/dL — ABNORMAL HIGH (ref 70–99)
Potassium: 5.2 mEq/L (ref 3.7–5.3)
Sodium: 134 mEq/L — ABNORMAL LOW (ref 137–147)

## 2014-03-01 MED ORDER — FUROSEMIDE 80 MG PO TABS
80.0000 mg | ORAL_TABLET | Freq: Two times a day (BID) | ORAL | Status: DC
  Administered 2014-03-01 – 2014-03-03 (×4): 80 mg via ORAL
  Filled 2014-03-01 (×6): qty 1

## 2014-03-01 NOTE — Progress Notes (Addendum)
[facility] PROGRESS NOTE Interim History: 68 y.o. male, with a past medical history significant for morbid obesity, congestive heart failure with an EF of 30-35% (January 2014), AICD placement, CAD s/p CABG 1996,LBBB, COPD, obstructive sleep apnea, AAA repair (January 2015) who is admitted with acute on chronic HF.  Says for past few days has noted worsening SOB, LE edema, orthopnea and PND. Says he weighs himself most days of the week but doesn't know how much weight he has gained. Saw PCP yesterday and started abx and prednisone without improvement. Saw Dr. Debara Pickett today and referred to ER for HF. Has been using CPAP during day to help with breathing. Drinks 68 oz of Green Tea per day. Denies CP.  Presented to ED with marked respiratory distress. CXR c/w CHF. pBNP 672. Ph 7.4/40/71/94% on 2L. Given lasix and diuresed > 2L so far. Breathing better but still SOB at rest. No CP.   Filed Weights   02/27/14 0640 02/28/14 0532 03/01/14 0449  Weight: 114.6 kg (252 lb 10.4 oz) 114.443 kg (252 lb 4.8 oz) 113.445 kg (250 lb 1.6 oz)        Intake/Output Summary (Last 24 hours) at 03/01/14 9604 Last data filed at 03/01/14 0631  Gross per 24 hour  Intake 1058.3 ml  Output   2400 ml  Net -1341.7 ml     Assessment/Plan: Acute on chronic systolic congestive heart failure, NYHA class 4 - Started on lasix gtt, changed to oral lasix watch for 24hrs. - Monitor electrolytes. Mild pump in cr. - strict I and O's daily weights. estimated dry weight 240-245 lbs.(111.3 kg) - due to dietary noncompliance.  - 121.1->117.8->117.3-> 114->113.4 kg  Acute COPD exacerbation  - Patient reports bronchitis episode on 1/17.  - completed antibiotics course. - Cont Levaquin orally.  OSA  Continue CPAP QHS  Oxygen during the day.   HTN  Controlled on Current Medications.   Recent AAA repair  January 2015.  Appears Stable.  HTN  Controlled on Current Medications.     Code Status: full Family  Communication: wife  Disposition Plan: inapteint   Consultants:  cardiology  Procedures: ECHO: none  Antibiotics:  levaquin 3.17.2015  HPI/Subjective: No complains Objective: Filed Vitals:   02/28/14 2025 02/28/14 2112 03/01/14 0449 03/01/14 0755  BP:  153/75 122/63   Pulse:  83 56   Temp:  97.7 F (36.5 C) 98 F (36.7 C)   TempSrc:  Oral Axillary   Resp:  18 18   Height:      Weight:   113.445 kg (250 lb 1.6 oz)   SpO2: 96% 98% 95% 93%     Exam:  General: Alert, awake, oriented x3, in no acute distress.  HEENT: No bruits, no goiter.- JVD Heart: Regular rate and rhythm, lower extremity edema.  Lungs: Good air movement, clear Abdomen: Soft, nontender, nondistended, positive bowel sounds.   Data Reviewed: Basic Metabolic Panel:  Recent Labs Lab 02/25/14 0001 02/26/14 0455 02/27/14 0552 02/28/14 1030 03/01/14 0540  NA 143 143 138 133* 134*  K 3.8 4.0 4.2 4.7 5.2  CL 100 103 96 93* 90*  CO2 27 26 26 27 28   GLUCOSE 140* 90 100* 123* 113*  BUN 20 24* 30* 39* 53*  CREATININE 0.88 0.84 1.07 1.26 1.53*  CALCIUM 9.1 9.3 9.6 9.8 10.2  MG 2.0  --   --   --   --    Liver Function Tests:  Recent Labs Lab 02/24/14 1226  AST 23  ALT 21  ALKPHOS 70  BILITOT 0.4  PROT 6.7  ALBUMIN 3.3*   No results found for this basename: LIPASE, AMYLASE,  in the last 168 hours No results found for this basename: AMMONIA,  in the last 168 hours CBC:  Recent Labs Lab 02/24/14 1226  WBC 4.8  NEUTROABS 4.1  HGB 12.2*  HCT 37.7*  MCV 91.5  PLT 134*   Cardiac Enzymes:  Recent Labs Lab 02/24/14 1910 02/24/14 2358 02/25/14 0520  TROPONINI <0.30 <0.30 <0.30   BNP (last 3 results)  Recent Labs  02/24/14 1226 02/24/14 1910  PROBNP 762.0* 912.7*   CBG:  Recent Labs Lab 02/24/14 2129 02/25/14 0550 02/25/14 1105 02/25/14 1625 02/25/14 2101  GLUCAP 136* 94 112* 140* 142*    No results found for this or any previous visit (from the past 240 hour(s)).    Studies: No results found.  Scheduled Meds: . amiodarone  200 mg Oral Daily  . aspirin EC  81 mg Oral Daily  . atorvastatin  40 mg Oral Daily  . carvedilol  25 mg Oral BID WC  . clopidogrel  75 mg Oral Daily  . enoxaparin (LOVENOX) injection  40 mg Subcutaneous Q24H  . finasteride  5 mg Oral Daily  . ipratropium-albuterol  3 mL Nebulization BID  . isosorbide mononitrate  15 mg Oral QHS  . levofloxacin  500 mg Oral Q24H  . levothyroxine  50 mcg Oral QAC breakfast  . omega-3 acid ethyl esters  1 g Oral Daily  . oxymetazoline  1 spray Each Nare BID  . pantoprazole  40 mg Oral Daily  . potassium chloride  40 mEq Oral BID  . ramipril  5 mg Oral BID  . sodium chloride  3 mL Intravenous Q12H  . spironolactone  25 mg Oral Daily   Continuous Infusions: . furosemide (LASIX) infusion 4 mg/hr (02/28/14 1410)     Venetia Constable Marguarite Arbour  [facility] Pager [phone] If 8PM-8AM, please contact night-coverage at [email or web address], password [facility] 03/01/2014, 8:33 AM  LOS: 5 days

## 2014-03-01 NOTE — Progress Notes (Signed)
   SUBJECTIVE:  Breathing better.  No pain.  Wants to walk today.    PHYSICAL EXAM Filed Vitals:   02/28/14 2025 02/28/14 2112 03/01/14 0449 03/01/14 0755  BP:  153/75 122/63   Pulse:  83 56   Temp:  97.7 F (36.5 C) 98 F (36.7 C)   TempSrc:  Oral Axillary   Resp:  18 18   Height:      Weight:   250 lb 1.6 oz (113.445 kg)   SpO2: 96% 98% 95% 93%   General:  No distress Lungs:  Clear Heart:  RRR Abdomen:  Positive bowel sounds, no rebound no guarding Extremities:  No edema  LABS:  Results for orders placed during the hospital encounter of 02/24/14 (from the past 24 hour(s))  BASIC METABOLIC PANEL     Status: Abnormal   Collection Time    02/28/14 10:30 AM      Result Value Ref Range   Sodium 133 (*) 137 - 147 mEq/L   Potassium 4.7  3.7 - 5.3 mEq/L   Chloride 93 (*) 96 - 112 mEq/L   CO2 27  19 - 32 mEq/L   Glucose, Bld 123 (*) 70 - 99 mg/dL   BUN 39 (*) 6 - 23 mg/dL   Creatinine, Ser 1.26  0.50 - 1.35 mg/dL   Calcium 9.8  8.4 - 10.5 mg/dL   GFR calc non Af Amer 57 (*) >90 mL/min   GFR calc Af Amer 65 (*) >90 mL/min  BASIC METABOLIC PANEL     Status: Abnormal   Collection Time    03/01/14  5:40 AM      Result Value Ref Range   Sodium 134 (*) 137 - 147 mEq/L   Potassium 5.2  3.7 - 5.3 mEq/L   Chloride 90 (*) 96 - 112 mEq/L   CO2 28  19 - 32 mEq/L   Glucose, Bld 113 (*) 70 - 99 mg/dL   BUN 53 (*) 6 - 23 mg/dL   Creatinine, Ser 1.53 (*) 0.50 - 1.35 mg/dL   Calcium 10.2  8.4 - 10.5 mg/dL   GFR calc non Af Amer 45 (*) >90 mL/min   GFR calc Af Amer 52 (*) >90 mL/min    Intake/Output Summary (Last 24 hours) at 03/01/14 0737 Last data filed at 03/01/14 0631  Gross per 24 hour  Intake  818.3 ml  Output   2100 ml  Net -1281.7 ml    ASSESSMENT AND PLAN:  ACUTE ON CHRONIC CHF:   Good UO.  Creat is up.  Weight down.  Changed to PO diuretic today.    CAD:  Continue medical management  HTN:  BP OK.  Continue current therapy.    Jeneen Rinks [facility] 03/01/2014 9:23  AM

## 2014-03-02 ENCOUNTER — Inpatient Hospital Stay (HOSPITAL_COMMUNITY): Payer: Medicare Other

## 2014-03-02 DIAGNOSIS — I5189 Other ill-defined heart diseases: Secondary | ICD-10-CM | POA: Diagnosis present

## 2014-03-02 DIAGNOSIS — N289 Disorder of kidney and ureter, unspecified: Secondary | ICD-10-CM | POA: Diagnosis present

## 2014-03-02 DIAGNOSIS — I2510 Atherosclerotic heart disease of native coronary artery without angina pectoris: Secondary | ICD-10-CM | POA: Diagnosis present

## 2014-03-02 DIAGNOSIS — I5043 Acute on chronic combined systolic (congestive) and diastolic (congestive) heart failure: Principal | ICD-10-CM

## 2014-03-02 DIAGNOSIS — I5042 Chronic combined systolic (congestive) and diastolic (congestive) heart failure: Secondary | ICD-10-CM | POA: Diagnosis present

## 2014-03-02 LAB — BASIC METABOLIC PANEL
BUN: 54 mg/dL — ABNORMAL HIGH (ref 6–23)
CO2: 26 mEq/L (ref 19–32)
Calcium: 10.1 mg/dL (ref 8.4–10.5)
Chloride: 90 mEq/L — ABNORMAL LOW (ref 96–112)
Creatinine, Ser: 1.38 mg/dL — ABNORMAL HIGH (ref 0.50–1.35)
GFR calc Af Amer: 59 mL/min — ABNORMAL LOW (ref >90)
GFR calc non Af Amer: 51 mL/min — ABNORMAL LOW (ref >90)
Glucose, Bld: 122 mg/dL — ABNORMAL HIGH (ref 70–99)
Potassium: 4.4 mEq/L (ref 3.7–5.3)
Sodium: 131 mEq/L — ABNORMAL LOW (ref 137–147)

## 2014-03-02 LAB — TROPONIN I
Troponin I: <0.3 ng/mL (ref <0.30)
Troponin I: <0.3 ng/mL (ref <0.30)
Troponin I: <0.3 ng/mL (ref <0.30)

## 2014-03-02 MED ORDER — PREDNISONE 10 MG PO TABS: ORAL_TABLET | ORAL | Status: DC

## 2014-03-02 MED ORDER — RAMIPRIL 5 MG PO CAPS: 5.0000 mg | ORAL_CAPSULE | Freq: Two times a day (BID) | ORAL | Status: DC

## 2014-03-02 MED ORDER — PREDNISONE 50 MG PO TABS
50.0000 mg | ORAL_TABLET | Freq: Every day | ORAL | Status: DC
  Administered 2014-03-02 – 2014-03-03 (×2): 50 mg via ORAL
  Filled 2014-03-02 (×2): qty 1

## 2014-03-02 MED ORDER — ISOSORBIDE MONONITRATE 15 MG HALF TABLET: 15.0000 mg | ORAL_TABLET | Freq: Every day | ORAL | Status: DC

## 2014-03-02 MED ORDER — LEVOFLOXACIN 500 MG PO TABS
500.0000 mg | ORAL_TABLET | Freq: Every day | ORAL | Status: DC
  Administered 2014-03-03: 500 mg via ORAL
  Filled 2014-03-02: qty 1

## 2014-03-02 MED ORDER — FUROSEMIDE 10 MG/ML IJ SOLN
80.0000 mg | Freq: Once | INTRAMUSCULAR | Status: AC
  Administered 2014-03-02: 80 mg via INTRAVENOUS
  Filled 2014-03-02: qty 8

## 2014-03-02 MED ORDER — LEVOFLOXACIN IN D5W 500 MG/100ML IV SOLN
500.0000 mg | INTRAVENOUS | Status: AC
  Administered 2014-03-02: 500 mg via INTRAVENOUS
  Filled 2014-03-02: qty 100

## 2014-03-02 NOTE — Progress Notes (Signed)
Utilization Review Completed Shanik [place] J. Matsue Strom, RN, BSN, NCM [phone]  

## 2014-03-02 NOTE — Progress Notes (Signed)
Report given to receiving RN. Patient sitting in the chair. Wife and family in room. No verbal complaints. No signs or symptoms of distress or discomfort.

## 2014-03-02 NOTE — Progress Notes (Signed)
PT Cancellation Note  Patient Details Name: [name] MRN: [id number] DOB: Feb 16, 1946   Cancelled Treatment:    Reason Eval/Treat Not Completed: Medical issues which prohibited therapy.  Attempted to see patient x2 today.  Reports he had a bad night and is having difficulty breathing today.  Declined PT today.  Will return tomorrow.   Despina Pole 03/02/2014, 5:54 PM Carita Pian. Sanjuana Kava, [place] Pager [phone]

## 2014-03-02 NOTE — Progress Notes (Addendum)
    Subjective:  No chest pain but c/o shortness of breath. Marked dyspnea with activity. Also c/o cough.  Objective:  Vital Signs in the last 24 hours: Temp:  [97.8 F (36.6 C)-98.1 F (36.7 C)] 98.1 F (36.7 C) (03/23 0606) Pulse Rate:  [60-66] 66 (03/23 0606) Resp:  [18] 18 (03/23 0606) BP: (117-147)/(57-98) 117/77 mmHg (03/23 0606) SpO2:  [94 %-95 %] 95 % (03/23 0606) Weight:  [250 lb (113.399 kg)] 250 lb (113.399 kg) (03/23 0606)  Intake/Output from previous day: 03/22 0701 - 03/23 0700 In: 27 [P.O.:960] Out: 1300 [Urine:1300]  Physical Exam: Pt is alert and oriented, obese male in NAD HEENT: normal Neck: JVP - mildly elevated Lungs: diffuse rhonchi CV: RRR without murmur or gallop Abd: soft, NT, Positive BS, obese Ext: trace pretibial edema Skin: warm/dry no rash   Lab Results: No results found for this basename: WBC, HGB, PLT,  in the last 72 hours  Recent Labs  03/01/14 0540 03/02/14 0455  NA 134* 131*  K 5.2 4.4  CL 90* 90*  CO2 28 26  GLUCOSE 113* 122*  BUN 53* 54*  CREATININE 1.53* 1.38*    Recent Labs  03/02/14 0917  TROPONINI <0.30    Cardiac Studies: CXR: IMPRESSION:  Improved lung aeration with resolving edema and atelectasis. No  pleural effusion.    Tele: Sinus rhythm  Assessment/Plan:  1. Acute on chronic systolic CHF 2. CAD - no active ischemia 3. HTN - controlled 4. Acute on chronic respiratory failure 5. Acute on chronic kidney injury  Pt has just walked short distance in hallway and O2 sat documented as low as 86%. CXR reviewed and has improved, but clinically he is not much better. Weight down 16 pounds. Meds reviewed - will give an additional dose of IV diuretic today and otherwise continue current Rx. Repeat BMET in am. Repeat EKG in am as he is on multiple QT prolonging agents.   Sherren Mocha, M.D. 03/02/2014, 1:07 PM

## 2014-03-02 NOTE — Progress Notes (Addendum)
C/o SOB this am, pt has been coughing off and on during the night, pt didn't have O2 on at the time except pt has been putting his CPAP mask up to his face off and on to get O2 (connected to O2 4L) periodically. Placed O2 [place] at 2. Faint rhonchi heard bilat.  sats obtained on the 2L, 93-94%.  Dr Olevia Bowens notified of above. sched po Lasix given.  Pt coughing up clear phlegm. Pt states he feels better after O2 on few minutes.  Dr Olevia Bowens coming in to see pt.

## 2014-03-02 NOTE — Progress Notes (Addendum)
SATURATION QUALIFICATIONS: (This note is used to comply with regulatory documentation for home oxygen)  Patient Saturations on Room Air at Rest = 89-90%  Patient Saturations on Room Air while Ambulating = 86-88%  Patient Saturations on  Liters of oxygen while Ambulating = %  Please briefly explain why patient needs home oxygen:  sats started at 89-90% RA at rest, pt ambulated in hall on RA, pt walked short distance,(4 rooms down from his) and was very SOB, sats dipped down to as low as 86%, ranged 86-88% while walking and while standing to "catch is breathe", after pursed lip breathing sats gradually back up to 89-90%, pt was able to make it back to his room and O2 was reapplied at 2L, sats gradually back up to 92-93%.  Dr Burt Knack observed pt during walk and having to stop to do pursed lip breathing.  Pt could not tolerate walking again at this time to try with O2.

## 2014-03-02 NOTE — Progress Notes (Addendum)
[facility] PROGRESS NOTE Interim History: 68 y.o. male, with a past medical history significant for morbid obesity, congestive heart failure with an EF of 30-35% (January 2014), AICD placement, CAD s/p CABG 1996,LBBB, COPD, obstructive sleep apnea, AAA repair (January 2015) who is admitted with acute on chronic HF.  Says for past few days has noted worsening SOB, LE edema, orthopnea and PND. Says he weighs himself most days of the week but doesn't know how much weight he has gained. Saw PCP yesterday and started abx and prednisone without improvement. Saw Dr. Debara Pickett today and referred to ER for HF. Has been using CPAP during day to help with breathing. Drinks 68 oz of Green Tea per day. Denies CP.  Presented to ED with marked respiratory distress. CXR c/w CHF. pBNP 672. Ph 7.4/40/71/94% on 2L. Given lasix and diuresed > 2L so far. Breathing better but still SOB at rest. No CP.   Filed Weights   02/28/14 0532 03/01/14 0449 03/02/14 0606  Weight: 114.443 kg (252 lb 4.8 oz) 113.445 kg (250 lb 1.6 oz) 113.399 kg (250 lb)        Intake/Output Summary (Last 24 hours) at 03/02/14 0816 Last data filed at 03/01/14 1940  Gross per 24 hour  Intake    960 ml  Output   1300 ml  Net   -340 ml     Assessment/Plan: Acute on chronic systolic congestive heart failure, NYHA class 4 - Started on lasix gtt, changed to oral lasix watch for 24hrs. - Monitor electrolytes. Mild pump in cr. - strict I and O's daily weights. estimated dry weight 240-245 lbs.(111.3 kg) - due to dietary noncompliance.  - 121.1->117.8->117.3-> 114->113.4 kg  Acute COPD exacerbation/Cough/Acute bronquitis: - New cough, restart Levaquin and cont prednisone. - Cont Levaquin orally. - check XRAY, ekg and troponins.  OSA  Continue CPAP QHS  Oxygen during the day.   HTN  Controlled on Current Medications.   Recent AAA repair  January 2015.  Appears Stable.  HTN  Controlled on Current Medications.     Code  Status: full Family Communication: wife  Disposition Plan: inapteint   Consultants:  cardiology  Procedures: ECHO: none  Antibiotics:  levaquin 3.17.2015  HPI/Subjective: Ongoing productive cough.  Objective: Filed Vitals:   03/01/14 1300 03/01/14 1700 03/01/14 2130 03/02/14 0606  BP: 119/41 143/98 147/57 117/77  Pulse: 58 64 60 66  Temp: 97.4 F (36.3 C)  97.8 F (36.6 C) 98.1 F (36.7 C)  TempSrc: Oral  Oral Oral  Resp: 20  18 18   Height:      Weight:    113.399 kg (250 lb)  SpO2: 95%  94% 95%     Exam:  General: Alert, awake, oriented x3, in no acute distress.  HEENT: No bruits, no goiter.- JVD Heart: Regular rate and rhythm, lower extremity edema.  Lungs: Good air movement, clear no wheezing. Abdomen: Soft, nontender, nondistended, positive bowel sounds.   Data Reviewed: Basic Metabolic Panel:  Recent Labs Lab 02/25/14 0001 02/26/14 0455 02/27/14 0552 02/28/14 1030 03/01/14 0540 03/02/14 0455  NA 143 143 138 133* 134* 131*  K 3.8 4.0 4.2 4.7 5.2 4.4  CL 100 103 96 93* 90* 90*  CO2 27 26 26 27 28 26   GLUCOSE 140* 90 100* 123* 113* 122*  BUN 20 24* 30* 39* 53* 54*  CREATININE 0.88 0.84 1.07 1.26 1.53* 1.38*  CALCIUM 9.1 9.3 9.6 9.8 10.2 10.1  MG 2.0  --   --   --   --   --  Liver Function Tests:  Recent Labs Lab 02/24/14 1226  AST 23  ALT 21  ALKPHOS 70  BILITOT 0.4  PROT 6.7  ALBUMIN 3.3*   No results found for this basename: LIPASE, AMYLASE,  in the last 168 hours No results found for this basename: AMMONIA,  in the last 168 hours CBC:  Recent Labs Lab 02/24/14 1226  WBC 4.8  NEUTROABS 4.1  HGB 12.2*  HCT 37.7*  MCV 91.5  PLT 134*   Cardiac Enzymes:  Recent Labs Lab 02/24/14 1910 02/24/14 2358 02/25/14 0520  TROPONINI <0.30 <0.30 <0.30   BNP (last 3 results)  Recent Labs  02/24/14 1226 02/24/14 1910  PROBNP 762.0* 912.7*   CBG:  Recent Labs Lab 02/24/14 2129 02/25/14 0550 02/25/14 1105  02/25/14 1625 02/25/14 2101  GLUCAP 136* 94 112* 140* 142*    No results found for this or any previous visit (from the past 240 hour(s)).   Studies: No results found.  Scheduled Meds: . amiodarone  200 mg Oral Daily  . aspirin EC  81 mg Oral Daily  . atorvastatin  40 mg Oral Daily  . carvedilol  25 mg Oral BID WC  . clopidogrel  75 mg Oral Daily  . enoxaparin (LOVENOX) injection  40 mg Subcutaneous Q24H  . finasteride  5 mg Oral Daily  . furosemide  80 mg Oral BID  . ipratropium-albuterol  3 mL Nebulization BID  . isosorbide mononitrate  15 mg Oral QHS  . levofloxacin (LEVAQUIN) IV  500 mg Intravenous Q24H  . [START ON 03/03/2014] levofloxacin  500 mg Oral Daily  . levothyroxine  50 mcg Oral QAC breakfast  . omega-3 acid ethyl esters  1 g Oral Daily  . oxymetazoline  1 spray Each Nare BID  . pantoprazole  40 mg Oral Daily  . ramipril  5 mg Oral BID  . sodium chloride  3 mL Intravenous Q12H  . spironolactone  25 mg Oral Daily   Continuous Infusions:     Charlynne Cousins  [facility] Pager [phone] If 8PM-8AM, please contact night-coverage at [email or web address], password [facility] 03/02/2014, 8:16 AM  LOS: 6 days

## 2014-03-03 ENCOUNTER — Encounter: Payer: Self-pay | Admitting: Gastroenterology

## 2014-03-03 DIAGNOSIS — N289 Disorder of kidney and ureter, unspecified: Secondary | ICD-10-CM

## 2014-03-03 LAB — BASIC METABOLIC PANEL
BUN: 54 mg/dL — ABNORMAL HIGH (ref 6–23)
CO2: 27 mEq/L (ref 19–32)
Calcium: 10 mg/dL (ref 8.4–10.5)
Chloride: 87 mEq/L — ABNORMAL LOW (ref 96–112)
Creatinine, Ser: 1.49 mg/dL — ABNORMAL HIGH (ref 0.50–1.35)
GFR calc Af Amer: 53 mL/min — ABNORMAL LOW (ref >90)
GFR calc non Af Amer: 46 mL/min — ABNORMAL LOW (ref >90)
Glucose, Bld: 128 mg/dL — ABNORMAL HIGH (ref 70–99)
Potassium: 5.6 mEq/L — ABNORMAL HIGH (ref 3.7–5.3)
Sodium: 130 mEq/L — ABNORMAL LOW (ref 137–147)

## 2014-03-03 LAB — PRO B NATRIURETIC PEPTIDE: Pro B Natriuretic peptide (BNP): 392.7 pg/mL — ABNORMAL HIGH (ref 0–125)

## 2014-03-03 MED ORDER — PREDNISONE 10 MG PO TABS: ORAL_TABLET | ORAL | Status: DC

## 2014-03-03 MED ORDER — LEVOFLOXACIN 500 MG PO TABS: 500.0000 mg | ORAL_TABLET | Freq: Every day | ORAL | Status: DC

## 2014-03-03 NOTE — Progress Notes (Signed)
SATURATION QUALIFICATIONS: (This note is used to comply with regulatory documentation for home oxygen)  Patient Saturations on Room Air at Rest = 94%  Patient Saturations on Room Air while Ambulating = 90-94%  Patient Saturations on 2 Liters of oxygen while Ambulating = 96-98%  Please briefly explain why patient needs home oxygen:Pt did not desat below 86% when ambulating with PT this am.  Thanks. [facility] [phone] (pager)

## 2014-03-03 NOTE — Progress Notes (Signed)
Pt requesting to walk in hallway, pt on 2L walked to end of hallway and back with standby assist. Pt O2 sat between 89-97 on walk. Pt states "I feel good this morning". Will continue to monitor. Ronnette Hila, RN

## 2014-03-03 NOTE — Progress Notes (Signed)
1515 Discharge instructions and prescriptions given to pt and spouse . Both verbalized understanding. 1520 Discharged to home Wheeled to llobby by NT

## 2014-03-03 NOTE — Progress Notes (Signed)
I cosign with Demarcus Steward on all assessments, documentation and medicine administration for this shift. Ronnette Hila, RN

## 2014-03-03 NOTE — Progress Notes (Signed)
Subjective: Feels a lot better  Objective: Vital signs in last 24 hours: Temp:  [97.4 F (36.3 C)-98.5 F (36.9 C)] 97.8 F (36.6 C) (03/24 0533) Pulse Rate:  [65-72] 72 (03/24 0533) Resp:  [17-18] 18 (03/24 0533) BP: (121-139)/(54-95) 121/77 mmHg (03/24 0533) SpO2:  [91 %-94 %] 94 % (03/24 0533) Weight:  [249 lb 6.4 oz (113.127 kg)] 249 lb 6.4 oz (113.127 kg) (03/24 0432) Last BM Date: 03/01/14  Intake/Output from previous day: 03/23 0701 - 03/24 0700 In: 720 [P.O.:720] Out: 2300 [Urine:2300] Intake/Output this shift:    Medications Current Facility-Administered Medications  Medication Dose Route Frequency Provider Last Rate Last Dose  . 0.9 %  sodium chloride infusion  250 mL Intravenous PRN Melton Alar, PA-C      . albuterol (PROVENTIL) (2.5 MG/3ML) 0.083% nebulizer solution 2.5 mg  2.5 mg Nebulization Q4H PRN Jonetta Osgood, MD      . alum & mag hydroxide-simeth (MAALOX/MYLANTA) 200-200-20 MG/5ML suspension 15 mL  15 mL Oral Q6H PRN Melton Alar, PA-C   15 mL at 03/02/14 1413  . amiodarone (PACERONE) tablet 200 mg  200 mg Oral Daily Melton Alar, PA-C   200 mg at 03/02/14 1054  . aspirin EC tablet 81 mg  81 mg Oral Daily Melton Alar, PA-C   81 mg at 03/02/14 1054  . atorvastatin (LIPITOR) tablet 40 mg  40 mg Oral Daily Melton Alar, PA-C   40 mg at 03/02/14 1053  . carvedilol (COREG) tablet 25 mg  25 mg Oral BID WC Melton Alar, PA-C   25 mg at 03/02/14 1757  . clopidogrel (PLAVIX) tablet 75 mg  75 mg Oral Daily Melton Alar, PA-C   75 mg at 03/02/14 1053  . enoxaparin (LOVENOX) injection 40 mg  40 mg Subcutaneous Q24H Melton Alar, PA-C   40 mg at 03/02/14 2137  . finasteride (PROSCAR) tablet 5 mg  5 mg Oral Daily Melton Alar, PA-C   5 mg at 03/02/14 1053  . furosemide (LASIX) tablet 80 mg  80 mg Oral BID Charlynne Cousins, MD   80 mg at 03/02/14 1757  . ipratropium-albuterol (DUONEB) 0.5-2.5 (3) MG/3ML nebulizer solution 3 mL  3 mL  Nebulization BID Jonetta Osgood, MD   3 mL at 03/02/14 1942  . isosorbide mononitrate (IMDUR) 24 hr tablet 15 mg  15 mg Oral QHS Charlynne Cousins, MD   15 mg at 03/02/14 2135  . levofloxacin (LEVAQUIN) tablet 500 mg  500 mg Oral Daily Charlynne Cousins, MD      . levothyroxine (SYNTHROID, LEVOTHROID) tablet 50 mcg  50 mcg Oral QAC breakfast Melton Alar, PA-C   50 mcg at 03/03/14 0532  . LORazepam (ATIVAN) tablet 0.5 mg  0.5 mg Oral BID PRN Gardiner Barefoot, NP   0.5 mg at 02/25/14 2127  . nitroGLYCERIN (NITROSTAT) SL tablet 0.4 mg  0.4 mg Sublingual Q5 min PRN Melton Alar, PA-C      . omega-3 acid ethyl esters (LOVAZA) capsule 1 g  1 g Oral Daily Melton Alar, PA-C   1 g at 03/02/14 1053  . ondansetron (ZOFRAN) injection 4 mg  4 mg Intravenous Q8H PRN Melton Alar, PA-C      . oxymetazoline (AFRIN) 0.05 % nasal spray 1 spray  1 spray Each Nare BID Charlynne Cousins, MD   1 spray at 03/02/14 2145  . pantoprazole (PROTONIX) EC tablet  40 mg  40 mg Oral Daily Charlynne Cousins, MD   40 mg at 03/02/14 1054  . predniSONE (DELTASONE) tablet 50 mg  50 mg Oral Q breakfast Charlynne Cousins, MD   50 mg at 03/02/14 1107  . ramipril (ALTACE) capsule 5 mg  5 mg Oral BID Charlynne Cousins, MD   5 mg at 03/02/14 2135  . sodium chloride 0.9 % injection 3 mL  3 mL Intravenous Q12H Melton Alar, PA-C   3 mL at 03/02/14 2135  . sodium chloride 0.9 % injection 3 mL  3 mL Intravenous PRN Melton Alar, PA-C      . spironolactone (ALDACTONE) tablet 25 mg  25 mg Oral Daily Melton Alar, PA-C   25 mg at 03/02/14 1053    PE: General appearance: alert, cooperative and no distress Lungs: Decrease BS.  No Wheeze or rhonchi Heart: regular rate and rhythm, S1, S2 normal, no murmur, click, rub or gallop Abdomen: +BS.  Nontender Extremities: Trace ankle edema Pulses: 2+ and symmetric 1+ PTs Skin: Warm and dry Neurologic: Grossly normal  Lab Results:  No results found for this  basename: WBC, HGB, HCT, PLT,  in the last 72 hours BMET  Recent Labs  03/01/14 0540 03/02/14 0455 03/03/14 0330  NA 134* 131* 130*  K 5.2 4.4 5.6*  CL 90* 90* 87*  CO2 28 26 27   GLUCOSE 113* 122* 128*  BUN 53* 54* 54*  CREATININE 1.53* 1.38* 1.49*  CALCIUM 10.2 10.1 10.0    Assessment/Plan    Principal Problem:   Acute on chronic combined systolic and diastolic congestive heart failure, NYHA class 4 Active Problems:   HYPERTENSION   COPD, moderately severe   OSA on CPAP   Hyperlipidemia LDL goal < 70   Cardiomyopathy, ischemic- EF 20-25% 2D 02/25/14   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 Cath '05 and '08 - medical Rx   LBBB  Plan:  Net fluids:  -1.6L/-12.8L.   Mild increase in SCr.  BP and HR stable.  K+ mildly elevated today.  No supplements in two days.  Continue to monitor.  Ambulated this morning on 2L and O2 sats 89-97%.   Home O2 ordered.  Will arrange follow up next week in the office.  Daily weight monitoring and low sodium diet was discussed.      LOS: 7 days    HAGER, BRYAN PA-C 03/03/2014 8:53 AM  Patient seen, evaluated. Available data reviewed. Agree with findings, assessment, and plan as outlined by Tarri Fuller, PA-C. Discussed plan with patient and wife. Pt to follow-up closely with Dr Debara Pickett or his PA/NP in the [place] office.   Sherren Mocha, M.D. 03/03/2014 9:21 AM

## 2014-03-03 NOTE — Progress Notes (Signed)
Physical Therapy Treatment Patient Details Name: [name] MRN: [id number] DOB: [birth year] Today's Date: 03/03/2014    History of Present Illness [name]  is an extremely pleasant  68 y.o. male, with a past medical history significant for congestive heart failure with an EF of 30-35% (January 2014), AICD placement, COPD, obstructive sleep apnea, AAA repair (January 2015) who presents to the emergency department complaining of shortness of breath that started last Thursday. The patient reports coughing up green sputum, he went to see his primary care physician started him on Bactrim and prednisone. His symptoms did not improve    PT Comments    Pt admitted with above. Pt currently with functional limitations due to endurance deficits.  Pt will benefit from skilled PT to increase their independence and safety with mobility to allow discharge to the venue listed below.   Follow Up Recommendations  Outpatient PT     Equipment Recommendations  Rolling walker with 5" wheels;Other (comment) (rollator with seat and shower chair)    Recommendations for Other Services       Precautions / Restrictions Precautions Precautions: None Restrictions Weight Bearing Restrictions: No    Mobility  Bed Mobility                  Transfers Overall transfer level: Modified independent                  Ambulation/Gait Ambulation/Gait assistance: Supervision Ambulation Distance (Feet): 200 Feet Assistive device: None Gait Pattern/deviations: Step-through pattern;Decreased stride length   Gait velocity interpretation: <1.8 ft/sec, indicative of risk for recurrent falls General Gait Details: Pt without LOB.  also did not desat less than 90% on RA.  MD notified.     Stairs            Wheelchair Mobility    Modified Rankin (Stroke Patients Only)       Balance Overall balance assessment: No apparent balance deficits (not formally assessed)                                  Cognition Arousal/Alertness: Awake/alert Behavior During Therapy: WFL for tasks assessed/performed Overall Cognitive Status: Within Functional Limits for tasks assessed                      Exercises General Exercises - Lower Extremity Ankle Circles/Pumps: AROM;Both;5 reps;Seated Long Arc Quad: AROM;Both;10 reps;Seated Hip Flexion/Marching: AROM;Both;10 reps;Seated    General Comments        Pertinent Vitals/Pain VSS, no pain Patient Saturations on Room Air at Rest = 94%  Patient Saturations on Room Air while Ambulating = 90-94%  Patient Saturations on 2 Liters of oxygen while Ambulating = 96-98%  Please briefly explain why patient needs home oxygen:Pt did not desat below 86% when ambulating with PT this am.      Home Living                      Prior Function            PT Goals (current goals can now be found in the care plan section) Progress towards PT goals: Progressing toward goals    Frequency  Min 3X/week    PT Plan Current plan remains appropriate    End of Session Equipment Utilized During Treatment: Gait belt Activity Tolerance: Patient limited by fatigue Patient left: in chair;with call bell/phone within reach;with  family/visitor present     Time: 1027-1052 PT Time Calculation (min): 25 min  Charges:  $Gait Training: 8-22 mins $Self Care/Home Management: 8-22                    G Codes:      [name],[name] March 23, 2014, 11:50 AM [name] Acute Rehabilitation [phone] (pager)

## 2014-03-03 NOTE — Discharge Summary (Signed)
Physician Discharge Summary  [name] YHC:[id number] DOB: [birth year] DOA: 02/24/2014  PCP: Tyrone Calico, MD  Admit date: 02/24/2014 Discharge date: 03/03/2014  Time spent: 35 minutes  Recommendations for Outpatient Follow-up:  1. Follow up with cardiology in 1 week. 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 Cath '05 and '08 - medical Rx   Discharge Condition: Stable  Diet recommendation: low sodium fluid restricted diet.    History of present illness:  68 y.o. male, with a past medical history significant for congestive heart failure with an EF of 30-35% (January 2014), AICD placement, COPD, obstructive sleep apnea, AAA repair (January 2015) who presents to the emergency department complaining of shortness of breath that started last Thursday. The patient reports coughing up green sputum, he went to see his primary care physician started him on Bactrim and prednisone. His symptoms did not improve. To the contrary for the past 3 nights he has been sitting up in a chair unable to breathe. He reports compliance with a low-salt diet and taking his medications. However, he mentions that when he has an appointment he will  skip his Lasix dose so that he can avoid frequent bathroom trips. He complains of musculoskeletal chest pain that he attributes to coughing. He mentions that he wears his CPAP at night, but frequently also has to wear it for 8 hours at a time during the day. He complains of recent chills, rigors, and diaphoresis. He has gained 17 pounds since late January.   Hospital Course:  Acute on chronic systolic congestive heart failure, NYHA class 4: - Started on lasix gtt, changed to oral lasix watch for 24hrs.  - Monitor electrolytes. Mild pump in cr. Once change to oral lasix improved. - Strict I and O's daily weights. estimated dry weight 240-245 lbs.(111.3 kg)  - Due to dietary noncompliance.  - 121.1->117.8->117.3-> 114->113.4 kg.  Acute COPD exacerbation/Cough/Acute bronquitis:  - New cough, start steroids, CXR showed no acute cardiopulmonary disease - Cont Levaquin orally.    OSA  Continue CPAP QHS  Oxygen during the day.   HTN  Controlled on Current Medications.   Recent AAA repair  January 2015.  Appears Stable.   HTN  Controlled on Current Medications.    Procedures:  CXR  2-d echo  Consultations:  none  Discharge Exam: Filed Vitals:   03/03/14 0533  BP: 121/77  Pulse: 72  Temp: 97.8 F (36.6 C)  Resp: 18    General: A&O x3 Cardiovascular: RRR Respiratory: good air movement CTA B/L  Discharge Instructions  Discharge Orders   Future Appointments Provider Department Dept Phone   08/04/2014 2:30 PM  [facility] [phone]   08/04/2014 3:00 PM [name], MD [facility] [phone]   08/21/2014 9:00 AM [facility] [address] [phone]   Eat a light meal the night before the exam Nothing to eat or drink for at least 8 hours before the exam No gum chewing, or smoking the morning of the exam Please take your morning medications with small sips of  water, especially blood pressure medication *Very Important* Please wear 2 piece clothing.   08/21/2014 9:45 AM Conrad C[place] C[place], MD [facility] [phone]   10/27/2014 8:45 AM Hayden Pedro, MD [place] [phone]   Future Orders Complete By Expires   Diet - low sodium heart healthy  As directed    Diet - low sodium heart healthy  As directed    Increase activity slowly  As directed    Increase activity slowly  As directed        Medication List    STOP taking these medications       sulfamethoxazole-trimethoprim 800-160 MG per tablet  Commonly known as:  BACTRIM DS      TAKE these medications       albuterol 108 (90 BASE) MCG/ACT inhaler  Commonly known as:  PROVENTIL HFA;VENTOLIN HFA  Inhale 2 puffs into the lungs every 6 (six) hours as needed for wheezing or shortness of breath.     aspirin 81 MG tablet  Take 81 mg by mouth daily.     atorvastatin 40 MG tablet  Commonly known as:  LIPITOR  Take 1 tablet (40 mg total) by mouth daily.     carvedilol 25 MG tablet  Commonly known as:  COREG  Take 1 tablet (25 mg total) by mouth 2 (two) times daily with a meal.     cholecalciferol 1000 UNITS tablet  Commonly known as:  VITAMIN D  Take 1,000 Units by mouth daily.     clopidogrel 75 MG tablet  Commonly known as:  PLAVIX  Take 75 mg by mouth daily.     CORDARONE 200 MG tablet  Generic drug:  amiodarone  Take 200 mg by mouth daily.     finasteride 5 MG tablet  Commonly known as:  PROSCAR  Take 5 mg by mouth daily.     fish oil-omega-3 fatty acids 1000 MG capsule  Take 1 g by mouth daily.     furosemide 80 MG tablet  Commonly known as:  LASIX  Take 1 tablet (80 mg total) by mouth 2 (two) times daily.     isosorbide mononitrate 15 mg Tb24 24 hr tablet  Commonly known as:  IMDUR  Take 0.5 tablets (15 mg total) by mouth at bedtime.     levofloxacin 500 MG tablet  Commonly known as:  LEVAQUIN  Take 1  tablet (500 mg total) by mouth daily.     levothyroxine 50 MCG tablet  Commonly known as:  SYNTHROID, LEVOTHROID  TAKE 1 TABLET (50 MCG TOTAL) BY MOUTH DAILY.     metolazone 2.5 MG tablet  Commonly known as:  ZAROXOLYN  Take 2.5 mg by mouth daily as needed (for swelling).     multivitamin-lutein Caps capsule  Take 1 capsule by mouth daily.     MENS 50+ MULTI VITAMIN/MIN Tabs  Take by mouth daily.     NITROSTAT 0.4 MG SL tablet  Generic drug:  nitroGLYCERIN  Dissolve 1 tablet under the tongue every 5 minutes as  needed for chest pain (up  to 3 tabs in 15 min then  call 911)     predniSONE 10 MG tablet  Commonly known as:  DELTASONE  Takes 2 tabs for 1 days, then 1 tab for 1 days, and then stop.     predniSONE 10 MG tablet  Commonly known as:  DELTASONE  Takes 6 tablets for 1 days, then 5 tablets for 1 days, then 4 tablets for 1 days, then 3 tablets for 1 days, then 2 tabs for 1 days, then 1 tab for 1 days, and then stop.     ramipril 5 MG capsule  Commonly known as:  ALTACE  Take 1 capsule (5 mg total) by mouth 2 (two) times daily.     spironolactone 25 MG tablet  Commonly known as:  ALDACTONE  Take 1 tablet (25 mg total) by mouth daily.     tiotropium 18 MCG inhalation capsule  Commonly known as:  SPIRIVA HANDIHALER  Place 1 capsule (18 mcg total) into inhaler and inhale daily.       Allergies  Allergen Reactions  . Ace Inhibitors     REACTION: cough  . Penicillins Other (See Comments)    "childhood allergy"  . Tussionex Pennkinetic Er [Hydrocod Polst-Cpm Polst Er] Other (See Comments)    "caused prostate problems"       Follow-up Information   Follow up with [facility] (RN/NA/PT)    Contact information:   [address] [place] [address] [phone]       Follow up with Minus Breeding, MD In 1 week. (hospital follow up)    Specialty:  Cardiology   Contact information:   [address] [place]  [address] [phone]        The results of significant diagnostics from this hospitalization (including imaging, microbiology, ancillary and laboratory) are listed below for reference.    Significant Diagnostic Studies: Dg Chest 2 View  03/02/2014   CLINICAL DATA:  Worsening shortness of breath.  EXAM: CHEST  2 VIEW  COMPARISON:  02/24/2014.  FINDINGS: The cardiac silhouette, mediastinal and hilar contours are within normal limits and stable. Stable pacer wires/ AICD. The lungs show improved aeration with resolving edema and atelectasis. No pleural effusion.  IMPRESSION: Improved lung aeration with resolving edema and atelectasis. No pleural effusion.   Electronically Signed   By: Kalman Jewels M.D.   On: 03/02/2014 09:59   Dg Chest 2 View  02/24/2014   CLINICAL DATA:  SHORTNESS OF BREATH LEG SWELLING  EXAM: CHEST  2 VIEW  COMPARISON:  CT CTA ABD/PEL W/CM AND/OR W/O CM dated 02/13/2014; DG CHEST 1V PORT dated 01/06/2014  FINDINGS: The cardiac silhouette is enlarged. Low lung volumes. A left pectoral as AICD with lead tip projecting in the region of the right ventricle. There is diffuse prominence of the interstitial markings, and a mild peribronchial cuffing. No focal regions of consolidation or focal infiltrates are appreciated. The osseous structures demonstrate no evidence of acute abnormalities. Patient is status post median sternotomy and coronary artery bypass grafting.  IMPRESSION: Interstitial infiltrate consistent with pulmonary edema. No focal regions of consolidation or focal infiltrates.   Electronically Signed   By: Margaree Mackintosh M.D.   On: 02/24/2014 12:58   Ct Angio Abd/pel W/ And/or W/o  02/13/2014   CLINICAL DATA:  Abdominal aortic aneurysm stent graft repair 01/06/2014  EXAM: CT ANGIOGRAPHY ABDOMEN AND  PELVIS WITH CONTRAST AND WITHOUT CONTRAST  TECHNIQUE: Multidetector CT imaging of the abdomen and pelvis was performed using the standard protocol during bolus administration of  intravenous contrast. Multiplanar reconstructed images and MIPs were obtained and reviewed to evaluate the vascular anatomy.  CONTRAST:  22mL OMNIPAQUE IOHEXOL 350 MG/ML SOLN  COMPARISON:  11/05/2013  FINDINGS: Vascular: Patient is status post aortic stent graft repair of the abdominal aortic aneurysm. Bifurcated stent graft extends from below the renal arteries into the common iliac arteries bilaterally. Negative for endoleak. Measuring at a similar level as before, the native aneurysm sac is stable measuring 5.8 x 4.9 cm.  Celiac, SMA, and renal arteries all remain patent. Patient is status post previous left renal and bilateral iliac stents. There is slight narrowing of the graft lumens at the bifurcation, suspect related to the prior iliac stents. No evidence of graft occlusion or thrombus. Iliac limbs terminate in the common iliac arteries. The common, internal and external iliac arteries remain patent. In the inguinal regions, the common femoral, proximal profunda femoral, and proximal superficial femoral arteries appear patent.  Nonvascular: Clear lung bases. Liver, gallbladder, biliary system, pancreas, spleen, adrenal glands, and kidneys are within normal limits for age and arterial phase imaging.  Negative for bowel obstruction, dilatation, ileus, or free air.  No abdominal free fluid, fluid collection, hemorrhage, abscess, or adenopathy.  Normal appendix demonstrated.  No pelvic free fluid, fluid collection, hemorrhage, abscess, adenopathy, inguinal abnormality, or hernia. No acute distal bowel process. Degenerative changes of the spine and pelvis. No acute osseous finding.  Review of the MIP images confirms the above findings.  IMPRESSION: Status post aortic stent graft repair of the abdominal aortic aneurysm. Stable native aneurysm sac diameter.  Negative for endoleak or acute finding.   Electronically Signed   By: Daryll Brod M.D.   On: 02/13/2014 12:11    Microbiology: No results found for this  or any previous visit (from the past 240 hour(s)).   Labs: Basic Metabolic Panel:  Recent Labs Lab 02/25/14 0001  02/27/14 0552 02/28/14 1030 03/01/14 0540 03/02/14 0455 03/03/14 0330  NA 143  < > 138 133* 134* 131* 130*  K 3.8  < > 4.2 4.7 5.2 4.4 5.6*  CL 100  < > 96 93* 90* 90* 87*  CO2 27  < > 26 27 28 26 27   GLUCOSE 140*  < > 100* 123* 113* 122* 128*  BUN 20  < > 30* 39* 53* 54* 54*  CREATININE 0.88  < > 1.07 1.26 1.53* 1.38* 1.49*  CALCIUM 9.1  < > 9.6 9.8 10.2 10.1 10.0  MG 2.0  --   --   --   --   --   --   < > = values in this interval not displayed. Liver Function Tests:  Recent Labs Lab 02/24/14 1226  AST 23  ALT 21  ALKPHOS 70  BILITOT 0.4  PROT 6.7  ALBUMIN 3.3*   No results found for this basename: LIPASE, AMYLASE,  in the last 168 hours No results found for this basename: AMMONIA,  in the last 168 hours CBC:  Recent Labs Lab 02/24/14 1226  WBC 4.8  NEUTROABS 4.1  HGB 12.2*  HCT 37.7*  MCV 91.5  PLT 134*   Cardiac Enzymes:  Recent Labs Lab 02/24/14 2358 02/25/14 0520 03/02/14 0917 03/02/14 1445 03/02/14 2016  TROPONINI <0.30 <0.30 <0.30 <0.30 <0.30   BNP: BNP (last 3 results)  Recent Labs  02/24/14 1226 02/24/14 1910  PROBNP 762.0* 912.7*   CBG:  Recent Labs Lab 02/24/14 2129 02/25/14 0550 02/25/14 1105 02/25/14 1625 02/25/14 2101  GLUCAP 136* 94 112* 140* 142*       Signed:  FELIZ ORTIZ, Usiel Astarita  [facility] 03/03/2014, 8:41 AM

## 2014-03-09 DIAGNOSIS — I10 Essential (primary) hypertension: Secondary | ICD-10-CM | POA: Diagnosis not present

## 2014-03-09 DIAGNOSIS — R262 Difficulty in walking, not elsewhere classified: Secondary | ICD-10-CM | POA: Diagnosis not present

## 2014-03-09 DIAGNOSIS — I2510 Atherosclerotic heart disease of native coronary artery without angina pectoris: Secondary | ICD-10-CM | POA: Diagnosis not present

## 2014-03-09 DIAGNOSIS — E119 Type 2 diabetes mellitus without complications: Secondary | ICD-10-CM | POA: Diagnosis not present

## 2014-03-09 DIAGNOSIS — I5023 Acute on chronic systolic (congestive) heart failure: Secondary | ICD-10-CM | POA: Diagnosis not present

## 2014-03-09 DIAGNOSIS — J441 Chronic obstructive pulmonary disease with (acute) exacerbation: Secondary | ICD-10-CM | POA: Diagnosis not present

## 2014-03-10 ENCOUNTER — Encounter: Payer: Self-pay | Admitting: Cardiology

## 2014-03-10 ENCOUNTER — Ambulatory Visit (INDEPENDENT_AMBULATORY_CARE_PROVIDER_SITE_OTHER): Payer: Medicare Other | Admitting: Cardiology

## 2014-03-10 ENCOUNTER — Telehealth: Payer: Self-pay | Admitting: Internal Medicine

## 2014-03-10 VITALS — BP 124/70 | HR 60 | Ht 68.0 in | Wt 250.9 lb

## 2014-03-10 DIAGNOSIS — I2510 Atherosclerotic heart disease of native coronary artery without angina pectoris: Secondary | ICD-10-CM

## 2014-03-10 DIAGNOSIS — I509 Heart failure, unspecified: Secondary | ICD-10-CM

## 2014-03-10 DIAGNOSIS — J449 Chronic obstructive pulmonary disease, unspecified: Secondary | ICD-10-CM | POA: Diagnosis not present

## 2014-03-10 NOTE — Patient Instructions (Signed)
Continue taking medications as prescribed Schedule and appointment with Dr. Laverta Baltimore (lung doctor) for COPD F/u with Dr. Debara Pickett in 3  months

## 2014-03-10 NOTE — Telephone Encounter (Signed)
Katie walked by at the time I was taking a message & worked pt in on CY's schedule for 4/1 @ 11.  Nothing further needed at this time.  Satira Anis

## 2014-03-11 ENCOUNTER — Encounter: Payer: Self-pay | Admitting: Internal Medicine

## 2014-03-11 ENCOUNTER — Ambulatory Visit (INDEPENDENT_AMBULATORY_CARE_PROVIDER_SITE_OTHER): Payer: Medicare Other | Admitting: Internal Medicine

## 2014-03-11 VITALS — BP 130/80 | HR 52 | Temp 97.7°F | Ht 68.0 in | Wt 251.0 lb

## 2014-03-11 DIAGNOSIS — J449 Chronic obstructive pulmonary disease, unspecified: Secondary | ICD-10-CM

## 2014-03-11 DIAGNOSIS — J441 Chronic obstructive pulmonary disease with (acute) exacerbation: Secondary | ICD-10-CM | POA: Diagnosis not present

## 2014-03-11 DIAGNOSIS — I2510 Atherosclerotic heart disease of native coronary artery without angina pectoris: Secondary | ICD-10-CM | POA: Diagnosis not present

## 2014-03-11 DIAGNOSIS — G4733 Obstructive sleep apnea (adult) (pediatric): Secondary | ICD-10-CM | POA: Diagnosis not present

## 2014-03-11 DIAGNOSIS — Z9989 Dependence on other enabling machines and devices: Secondary | ICD-10-CM

## 2014-03-11 DIAGNOSIS — J209 Acute bronchitis, unspecified: Secondary | ICD-10-CM | POA: Diagnosis not present

## 2014-03-11 DIAGNOSIS — I509 Heart failure, unspecified: Secondary | ICD-10-CM

## 2014-03-11 DIAGNOSIS — I10 Essential (primary) hypertension: Secondary | ICD-10-CM | POA: Diagnosis not present

## 2014-03-11 DIAGNOSIS — E119 Type 2 diabetes mellitus without complications: Secondary | ICD-10-CM | POA: Diagnosis not present

## 2014-03-11 DIAGNOSIS — I5043 Acute on chronic combined systolic (congestive) and diastolic (congestive) heart failure: Secondary | ICD-10-CM

## 2014-03-11 DIAGNOSIS — R262 Difficulty in walking, not elsewhere classified: Secondary | ICD-10-CM | POA: Diagnosis not present

## 2014-03-11 DIAGNOSIS — I5023 Acute on chronic systolic (congestive) heart failure: Secondary | ICD-10-CM | POA: Diagnosis not present

## 2014-03-11 MED ORDER — ALBUTEROL SULFATE (2.5 MG/3ML) 0.083% IN NEBU: 2.5000 mg | INHALATION_SOLUTION | Freq: Four times a day (QID) | RESPIRATORY_TRACT | Status: DC | PRN

## 2014-03-11 MED ORDER — COMPRESSOR/NEBULIZER MISC: Status: DC

## 2014-03-11 NOTE — Progress Notes (Signed)
06/03/12- 13 yoM former smoker seen in pulmonary consultation at the request of Dr. Ronnald Ramp. Per Dr. Ronnald Ramp. Patient c/o sob and difficultly breathing when sitting while talking and walking.  Denies chest pain, chest tightness, wheeizng, and cough. Diagnosed with COPD 10 years ago. Wife is here. COPD Assessment Test (CAT) scored 24/40. He had smoked a pack a day half per day until 2005. 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 He was then diagnosed with hypothyroidism. Since then he has not exercised regularly, has felt weak with easier dyspnea on exertion. He denies cough or wheeze. Little change from day to day. His wife says he gets short of breath climbing 10 steps and he has to stop at the top. He has gained weight since thyroid diagnosis and with lack of exercise. He indicates 40 pound weight gain in the past 2 years. Coronary artery disease with history of multiple myocardial infarctions, congestive heart failure, hypertension, implanted AICD in left chest. No history of asthma. Ankles swell. Diagnosed obstructive sleep apnea/Dr. Kelly/CPAP/Advanced, unknown pressure. Anemia 4 years ago required transfusion. Uses Spiriva daily, occasional rescue Combivent./ MI/ AICD, OSA(Dr Kelly)  07/16/12- 66 yoM former smoker followed for dyspnea/ COPD, complicated by hx OSA/ Dr Claiborne Billings, CAD/ CHF/ MI/ AICD Wife is here today Found to be anemic and now treated with iron. Very short of breath over the weekend/blames weather, limiting dyspnea on exertion then but today feels "great". Using Spiriva with rare need for rescue albuterol. Says he is getting bronchitis less often than in the past. COPD assessment test (CAT) score 27/40 Complains of nasal stuffiness supine. Failed nasal strips. Sleeps sitting up because of this and uses saline nasal rinse. 6 minute walk test 07/16/2012: 95%, 97%, 98%, 216 m-ended test labored  for breath and fatigue but without desaturation. CXR 06/10/12 reviewed with them-bronchitis changes, CE/ASVD/CABG IMPRESSION:  1. Diffuse peribronchial cuffing. Given the patient's history of  prior smoking, this may suggest chronic bronchitis.  2. In addition, there is mild cardiomegaly with pulmonary venous  congestion, but no frank pulmonary edema at this time.  3. Status post median sternotomy for CABG.  4. Atherosclerosis.  Original Report Authenticated By: Etheleen Mayhew, M.D.  PFT: 07/16/2012-moderate to severe obstructive airways disease with air trapping, diffusion mildly reduced. FEV1/FVC 0.49. Insignificant response to bronchodilator. RV 128%, DLCO 69%.   12/20/12-  89 yoM former smoker followed for dyspnea/ COPD, complicated by hx OSA/ Dr Claiborne Billings, CAD/ CHF/ MI/ AICD     Wife here FOLLOWS FOR: breathing is unchanged since last OV. denies chest pain, chest tightness or coughing. reports increased SOB with extertion. Probably no change in dyspnea with exertion when he thinks in terms of the impact on ADLs. He says he joined weight watchers last night and plans to lose 100 pounds. His cardiologist has increased his diuretic. No routine cough, phlegm or wheeze. CXR 06/10/12 IMPRESSION:  1. Diffuse peribronchial cuffing. Given the patient's history of  prior smoking, this may suggest chronic bronchitis.  2. In addition, there is mild cardiomegaly with pulmonary venous  congestion, but no frank pulmonary edema at this time.  3. Status post median sternotomy for CABG.  4. Atherosclerosis.  Original Report Authenticated By: Etheleen Mayhew, M.D.  03/11/14- 85 yoM former smoker followed for dyspnea/ COPD, complicated by hx OSA/ Dr Claiborne Billings, CAD/ CHF/ MI/ AICD     Wife here Presents with SOB with any  exertion, chest congestion, nonprod cough.  Cardiologist recommended this visit yesterday.  Wears cpap [facility] nightly, 12 hours/night as well as during day.  Using rescue inhaler and  Spiriva. Has not qualified for home oxygen. 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 In early March and rhonchi this with green sputum. Office spirometry 03/11/14- weak/submaximal effort. By the numbers-severe obstructive airways disease and restriction of exhaled volume. FVC 2.49/59%, FEV1 1.29/89%, FEV1/FEC 0.5 to, FEF 25-75% 0.82/27%. CXR 03/02/14 IMPRESSION:  Improved lung aeration with resolving edema and atelectasis. No  pleural effusion.  Electronically Signed  By: Kalman Jewels M.D.  On: 03/02/2014 09:59   ROS-see HPI Constitutional:   No-   weight loss, night sweats, fevers, chills, fatigue, lassitude. HEENT:   No-  headaches, difficulty swallowing, tooth/dental problems, sore throat,       No-  sneezing, itching, ear ache, +nasal congestion, post nasal drip,  CV:  No-   chest pain, +orthopnea, PND, +swelling in lower extremities, No-anasarca, dizziness, palpitations Resp: + shortness of breath with exertion or at rest.              No-   productive cough,  No non-productive cough,  No- coughing up of blood.              No-   change in color of mucus.  No- wheezing.   Skin: No-   rash or lesions. GI:  No-   heartburn, indigestion, abdominal pain, nausea, vomiting, GU: . MS:  No-   joint pain or swelling.  Neuro-     nothing unusual Psych:  No- change in mood or affect. No depression or anxiety.  No memory loss.  OBJ- Physical Exam BP 130/80  Pulse 52  Temp(Src) 97.7 F (36.5 C) (Oral)  Ht 5\' 8"  (1.727 m)  Wt 251 lb (113.853 kg)  BMI 38.17 kg/m2  SpO2 94%  General- Alert, Oriented, Affect-appropriate, Distress- none acute, obese, talkative Skin-+bruise Lymphadenopathy- none Head- atraumatic            Eyes- Gross vision intact, PERRLA, conjunctivae and secretions clear            Ears- Hearing, canals-normal            Nose- +turbinate edema, no-Septal dev, mucus, polyps, erosion, perforation             Throat- Mallampati III, mucosa clear ,  drainage- none, tonsils- atrophic Neck- flexible , trachea midline, no stridor , thyroid nl, carotid no bruit Chest - symmetrical excursion , unlabored           Heart/CV- RRR , no murmur , no gallop  , no rub, nl s1 s2                           - JVD- none , edema 2+, stasis changes- none, varices- none           Lung- clear to P&A shallow inspiratory effort, rales- none, wheeze- none, cough- none , dullness-none, rub- none, raspy last           Chest wall- sternotomy scar,  Pacemaker defibrillator L Abd-  Br/ Gen/ Rectal- Not done, not indicated Extrem- cyanosis- none, clubbing, none, atrophy- none, strength- nl, vein donor scars Neuro- head bob tremor

## 2014-03-11 NOTE — Patient Instructions (Signed)
Order- Order- DME [facility]    Nebulizer machine and script for albuterol   Office spirometry  Dx COPD

## 2014-03-12 DIAGNOSIS — I10 Essential (primary) hypertension: Secondary | ICD-10-CM | POA: Diagnosis not present

## 2014-03-12 DIAGNOSIS — I2510 Atherosclerotic heart disease of native coronary artery without angina pectoris: Secondary | ICD-10-CM | POA: Diagnosis not present

## 2014-03-12 DIAGNOSIS — J441 Chronic obstructive pulmonary disease with (acute) exacerbation: Secondary | ICD-10-CM | POA: Diagnosis not present

## 2014-03-12 DIAGNOSIS — I5023 Acute on chronic systolic (congestive) heart failure: Secondary | ICD-10-CM | POA: Diagnosis not present

## 2014-03-12 DIAGNOSIS — E119 Type 2 diabetes mellitus without complications: Secondary | ICD-10-CM | POA: Diagnosis not present

## 2014-03-12 DIAGNOSIS — R262 Difficulty in walking, not elsewhere classified: Secondary | ICD-10-CM | POA: Diagnosis not present

## 2014-03-13 DIAGNOSIS — I5023 Acute on chronic systolic (congestive) heart failure: Secondary | ICD-10-CM | POA: Diagnosis not present

## 2014-03-13 DIAGNOSIS — E119 Type 2 diabetes mellitus without complications: Secondary | ICD-10-CM | POA: Diagnosis not present

## 2014-03-13 DIAGNOSIS — J441 Chronic obstructive pulmonary disease with (acute) exacerbation: Secondary | ICD-10-CM | POA: Diagnosis not present

## 2014-03-13 DIAGNOSIS — I2510 Atherosclerotic heart disease of native coronary artery without angina pectoris: Secondary | ICD-10-CM | POA: Diagnosis not present

## 2014-03-13 DIAGNOSIS — R262 Difficulty in walking, not elsewhere classified: Secondary | ICD-10-CM | POA: Diagnosis not present

## 2014-03-13 DIAGNOSIS — I10 Essential (primary) hypertension: Secondary | ICD-10-CM | POA: Diagnosis not present

## 2014-03-14 ENCOUNTER — Encounter: Payer: Self-pay | Admitting: Cardiology

## 2014-03-14 NOTE — Progress Notes (Signed)
Patient ID: [name], male   DOB: 1946/01/11, 68 y.o.   MRN: [id number]    03/15/2014 [name]   18-Sep-1946  [id number]  Primary Physicia [name], MD Primary Cardiologist: Dr. Debara Pickett  HPI:  [name] is a pleasant 68 y.o. Male, followed by Dr. Debara Pickett, with a past medical history significant for morbid obesity, congestive heart failure with an EF of 30-35% (January 2014), AICD placement, CAD s/p CABG 1996,LBBB, COPD, obstructive sleep apnea, AAA repair (January 2015) who was admitted to [facility] on 02/24/14 for acute on chronic HF and acute COPD exacerbation.  The day of admission, he was seen by Dr. Debara Pickett in clinic for evaluation of SOB, cough, weight gain, fatigue and leg swelling. He was noted to be 17lb over his dry weight. His weight in clinic was 267 lb. Dr. Debara Pickett referred him to the ER for HF. He was admitted by [facility]. He was treated with IV diuretic therapy as well as antibiotics and steroids for COPD. After a euvolemic state was achieved, he was discharged home. His discharge weight on 03/03/14 was 250 lbs. He was instructed to continue his antibiotic and steroids at home.  He presents to clinic today for post-hospital f/u. He continues to not significant SOB, both at rest and with exertion. No orthopnea or PND. No further weight gain and no LEE. He continues to note a productive cough. Denies CP.     Current Outpatient Prescriptions  Medication Sig Dispense Refill  . albuterol (PROVENTIL HFA;VENTOLIN HFA) 108 (90 BASE) MCG/ACT inhaler Inhale 2 puffs into the lungs every 6 (six) hours as needed for wheezing or shortness of breath.  1 Inhaler  3  . amiodarone (CORDARONE) 200 MG tablet Take 200 mg by mouth daily.       Marland Kitchen aspirin 81 MG tablet Take 81 mg by mouth daily.      Marland Kitchen atorvastatin (LIPITOR) 40 MG tablet Take 1 tablet (40 mg total) by mouth daily.  90 tablet  3  . carvedilol (COREG) 25 MG tablet Take 1 tablet (25 mg total) by mouth 2 (two) times daily with a meal.  180 tablet   3  . clopidogrel (PLAVIX) 75 MG tablet Take 75 mg by mouth daily.        . finasteride (PROSCAR) 5 MG tablet Take 5 mg by mouth daily.      . fish oil-omega-3 fatty acids 1000 MG capsule Take 1 g by mouth daily.       . furosemide (LASIX) 80 MG tablet Take 1 tablet (80 mg total) by mouth 2 (two) times daily.  180 tablet  3  . isosorbide mononitrate (IMDUR) 15 mg TB24 24 hr tablet Take 0.5 tablets (15 mg total) by mouth at bedtime.  30 tablet  0  . levothyroxine (SYNTHROID, LEVOTHROID) 50 MCG tablet TAKE 1 TABLET (50 MCG TOTAL) BY MOUTH DAILY.  90 tablet  1  . metolazone (ZAROXOLYN) 2.5 MG tablet Take 2.5 mg by mouth daily as needed (for swelling).       . Multiple Vitamins-Minerals (MENS 50+ MULTI VITAMIN/MIN) TABS Take by mouth daily.      . multivitamin-lutein (OCUVITE-LUTEIN) CAPS Take 1 capsule by mouth daily.        Marland Kitchen NITROSTAT 0.4 MG SL tablet Dissolve 1 tablet under the tongue every 5 minutes as  needed for chest pain (up  to 3 tabs in 15 min then  call 911)  25 tablet  3  . NON FORMULARY at bedtime.  CPAP And during the day as needed      . ramipril (ALTACE) 5 MG capsule Take 2.5 mg by mouth 2 (two) times daily.      Marland Kitchen spironolactone (ALDACTONE) 25 MG tablet Take 1 tablet (25 mg total) by mouth daily.  90 tablet  3  . tiotropium (SPIRIVA HANDIHALER) 18 MCG inhalation capsule Place 1 capsule (18 mcg total) into inhaler and inhale daily.  90 capsule  3  . albuterol (PROVENTIL) (2.5 MG/3ML) 0.083% nebulizer solution Take 3 mLs (2.5 mg total) by nebulization every 6 (six) hours as needed for wheezing or shortness of breath.  75 mL  12  . Nebulizers (COMPRESSOR/NEBULIZER) MISC Use as directed  1 each  0   No current facility-administered medications for this visit.    Allergies  Allergen Reactions  . Ace Inhibitors     REACTION: cough  . Penicillins Other (See Comments)    "childhood allergy"  . Tussionex Pennkinetic Er [Hydrocod Polst-Cpm Polst Er] Other (See Comments)    "caused  prostate problems"    History   Social History  . Marital Status: Married    Spouse Name: [name]    Number of Children: 2  . Years of Education: [name]   Occupational History  . retired Solicitor     Social History Main Topics  . Smoking status: Former Smoker -- 1.50 packs/day for 40 years    Types: Cigarettes    Quit date: 12/11/2002  . Smokeless tobacco: Former Systems developer  . Alcohol Use: No  . Drug Use: No  . Sexual Activity: Not Currently   Other Topics Concern  . Not on file   Social History Narrative  . No narrative on file     Review of Systems: General: negative for chills, fever, night sweats or weight changes.  Cardiovascular: negative for chest pain, dyspnea on exertion, edema, orthopnea, palpitations, paroxysmal nocturnal dyspnea or shortness of breath Dermatological: negative for rash Respiratory: negative for cough or wheezing Urologic: negative for hematuria Abdominal: negative for nausea, vomiting, diarrhea, bright red blood per rectum, melena, or hematemesis Neurologic: negative for visual changes, syncope, or dizziness All other systems reviewed and are otherwise negative except as noted above.    Blood pressure 124/70, pulse 60, height 5\' 8"  (1.727 m), weight 250 lb 14.4 oz (113.807 kg), SpO2 93.00%.  General appearance: alert, cooperative and no distress Neck: no carotid bruit and no JVD Lungs: no rales, but he does have decreased BS bilaterally + expiratory wheezing c/w COPD Heart: regular rate and rhythm, S1, S2 normal, no murmur, click, rub or gallop Extremities: no LEE Pulses: 2+ and symmetric Skin: warm and dry Neurologic: Grossly normal   ASSESSMENT AND PLAN:   Acute on chronic heart failure He appears euvolemic on exam today. There is no JVD and no LEE. Lung exam is positive for decreased breath sounds and expiratory wheezing, but no Rales. His weight in the office today is his dry weight and the same exact weight when he was  discharged from the hospital (250lb). I feel his continued dyspnea is secondary to his COPD. Continue current meds for HF.   COPD, moderately severe I feel that COPD is contributing greatly to his dyspnea. I have recommended that he follow back up with his pulmonologist, [name]. He may require repeat PFTs and consideration for chronic home O2.     PLAN  Continue current HF medication regimen. F/U with Dr. Debara Pickett in 6-8 weeks. F/u with [name]  for COPD.   SIMMONS, BRITTAINYPA-C 03/15/2014 8:22 PM

## 2014-03-15 NOTE — Assessment & Plan Note (Addendum)
He appears euvolemic on exam today. There is no JVD and no LEE. Lung exam is positive for decreased breath sounds and expiratory wheezing, but no Rales. His weight in the office today is his dry weight and the same exact weight when he was discharged from the hospital (250lb). I feel his continued dyspnea is secondary to his COPD. Continue current meds for HF.

## 2014-03-15 NOTE — Assessment & Plan Note (Signed)
I feel that COPD is contributing greatly to his dyspnea. I have recommended that he follow back up with his pulmonologist, [name]. He may require repeat PFTs and consideration for chronic home O2.

## 2014-03-17 ENCOUNTER — Other Ambulatory Visit: Payer: Self-pay | Admitting: Internal Medicine

## 2014-03-17 DIAGNOSIS — I2510 Atherosclerotic heart disease of native coronary artery without angina pectoris: Secondary | ICD-10-CM | POA: Diagnosis not present

## 2014-03-17 DIAGNOSIS — J441 Chronic obstructive pulmonary disease with (acute) exacerbation: Secondary | ICD-10-CM | POA: Diagnosis not present

## 2014-03-17 DIAGNOSIS — I10 Essential (primary) hypertension: Secondary | ICD-10-CM | POA: Diagnosis not present

## 2014-03-17 DIAGNOSIS — I5023 Acute on chronic systolic (congestive) heart failure: Secondary | ICD-10-CM | POA: Diagnosis not present

## 2014-03-17 DIAGNOSIS — R262 Difficulty in walking, not elsewhere classified: Secondary | ICD-10-CM | POA: Diagnosis not present

## 2014-03-17 DIAGNOSIS — E119 Type 2 diabetes mellitus without complications: Secondary | ICD-10-CM | POA: Diagnosis not present

## 2014-03-19 DIAGNOSIS — R262 Difficulty in walking, not elsewhere classified: Secondary | ICD-10-CM | POA: Diagnosis not present

## 2014-03-19 DIAGNOSIS — I5023 Acute on chronic systolic (congestive) heart failure: Secondary | ICD-10-CM | POA: Diagnosis not present

## 2014-03-19 DIAGNOSIS — E119 Type 2 diabetes mellitus without complications: Secondary | ICD-10-CM | POA: Diagnosis not present

## 2014-03-19 DIAGNOSIS — I10 Essential (primary) hypertension: Secondary | ICD-10-CM | POA: Diagnosis not present

## 2014-03-19 DIAGNOSIS — J441 Chronic obstructive pulmonary disease with (acute) exacerbation: Secondary | ICD-10-CM | POA: Diagnosis not present

## 2014-03-19 DIAGNOSIS — I2510 Atherosclerotic heart disease of native coronary artery without angina pectoris: Secondary | ICD-10-CM | POA: Diagnosis not present

## 2014-03-20 DIAGNOSIS — R262 Difficulty in walking, not elsewhere classified: Secondary | ICD-10-CM | POA: Diagnosis not present

## 2014-03-20 DIAGNOSIS — I2510 Atherosclerotic heart disease of native coronary artery without angina pectoris: Secondary | ICD-10-CM | POA: Diagnosis not present

## 2014-03-20 DIAGNOSIS — I10 Essential (primary) hypertension: Secondary | ICD-10-CM | POA: Diagnosis not present

## 2014-03-20 DIAGNOSIS — J441 Chronic obstructive pulmonary disease with (acute) exacerbation: Secondary | ICD-10-CM | POA: Diagnosis not present

## 2014-03-20 DIAGNOSIS — E119 Type 2 diabetes mellitus without complications: Secondary | ICD-10-CM | POA: Diagnosis not present

## 2014-03-20 DIAGNOSIS — I5023 Acute on chronic systolic (congestive) heart failure: Secondary | ICD-10-CM | POA: Diagnosis not present

## 2014-03-24 DIAGNOSIS — I10 Essential (primary) hypertension: Secondary | ICD-10-CM | POA: Diagnosis not present

## 2014-03-24 DIAGNOSIS — I5023 Acute on chronic systolic (congestive) heart failure: Secondary | ICD-10-CM | POA: Diagnosis not present

## 2014-03-24 DIAGNOSIS — J441 Chronic obstructive pulmonary disease with (acute) exacerbation: Secondary | ICD-10-CM | POA: Diagnosis not present

## 2014-03-24 DIAGNOSIS — I2510 Atherosclerotic heart disease of native coronary artery without angina pectoris: Secondary | ICD-10-CM | POA: Diagnosis not present

## 2014-03-24 DIAGNOSIS — R262 Difficulty in walking, not elsewhere classified: Secondary | ICD-10-CM | POA: Diagnosis not present

## 2014-03-24 DIAGNOSIS — E119 Type 2 diabetes mellitus without complications: Secondary | ICD-10-CM | POA: Diagnosis not present

## 2014-03-25 DIAGNOSIS — I10 Essential (primary) hypertension: Secondary | ICD-10-CM

## 2014-03-25 DIAGNOSIS — I2510 Atherosclerotic heart disease of native coronary artery without angina pectoris: Secondary | ICD-10-CM

## 2014-03-25 DIAGNOSIS — R262 Difficulty in walking, not elsewhere classified: Secondary | ICD-10-CM | POA: Diagnosis not present

## 2014-03-25 DIAGNOSIS — E119 Type 2 diabetes mellitus without complications: Secondary | ICD-10-CM | POA: Diagnosis not present

## 2014-03-25 DIAGNOSIS — I5023 Acute on chronic systolic (congestive) heart failure: Secondary | ICD-10-CM | POA: Diagnosis not present

## 2014-03-25 DIAGNOSIS — J441 Chronic obstructive pulmonary disease with (acute) exacerbation: Secondary | ICD-10-CM | POA: Diagnosis not present

## 2014-03-26 DIAGNOSIS — I10 Essential (primary) hypertension: Secondary | ICD-10-CM | POA: Diagnosis not present

## 2014-03-26 DIAGNOSIS — E119 Type 2 diabetes mellitus without complications: Secondary | ICD-10-CM | POA: Diagnosis not present

## 2014-03-26 DIAGNOSIS — I2510 Atherosclerotic heart disease of native coronary artery without angina pectoris: Secondary | ICD-10-CM | POA: Diagnosis not present

## 2014-03-26 DIAGNOSIS — I5023 Acute on chronic systolic (congestive) heart failure: Secondary | ICD-10-CM | POA: Diagnosis not present

## 2014-03-26 DIAGNOSIS — R262 Difficulty in walking, not elsewhere classified: Secondary | ICD-10-CM | POA: Diagnosis not present

## 2014-03-26 DIAGNOSIS — J441 Chronic obstructive pulmonary disease with (acute) exacerbation: Secondary | ICD-10-CM | POA: Diagnosis not present

## 2014-03-31 DIAGNOSIS — I10 Essential (primary) hypertension: Secondary | ICD-10-CM | POA: Diagnosis not present

## 2014-03-31 DIAGNOSIS — J441 Chronic obstructive pulmonary disease with (acute) exacerbation: Secondary | ICD-10-CM | POA: Diagnosis not present

## 2014-03-31 DIAGNOSIS — I5023 Acute on chronic systolic (congestive) heart failure: Secondary | ICD-10-CM | POA: Diagnosis not present

## 2014-03-31 DIAGNOSIS — E119 Type 2 diabetes mellitus without complications: Secondary | ICD-10-CM | POA: Diagnosis not present

## 2014-03-31 DIAGNOSIS — I2510 Atherosclerotic heart disease of native coronary artery without angina pectoris: Secondary | ICD-10-CM | POA: Diagnosis not present

## 2014-03-31 DIAGNOSIS — R262 Difficulty in walking, not elsewhere classified: Secondary | ICD-10-CM | POA: Diagnosis not present

## 2014-04-01 DIAGNOSIS — R262 Difficulty in walking, not elsewhere classified: Secondary | ICD-10-CM | POA: Diagnosis not present

## 2014-04-01 DIAGNOSIS — I5023 Acute on chronic systolic (congestive) heart failure: Secondary | ICD-10-CM | POA: Diagnosis not present

## 2014-04-01 DIAGNOSIS — J441 Chronic obstructive pulmonary disease with (acute) exacerbation: Secondary | ICD-10-CM | POA: Diagnosis not present

## 2014-04-01 DIAGNOSIS — I2510 Atherosclerotic heart disease of native coronary artery without angina pectoris: Secondary | ICD-10-CM | POA: Diagnosis not present

## 2014-04-01 DIAGNOSIS — E119 Type 2 diabetes mellitus without complications: Secondary | ICD-10-CM | POA: Diagnosis not present

## 2014-04-01 DIAGNOSIS — I10 Essential (primary) hypertension: Secondary | ICD-10-CM | POA: Diagnosis not present

## 2014-04-02 DIAGNOSIS — J441 Chronic obstructive pulmonary disease with (acute) exacerbation: Secondary | ICD-10-CM | POA: Diagnosis not present

## 2014-04-02 DIAGNOSIS — E119 Type 2 diabetes mellitus without complications: Secondary | ICD-10-CM | POA: Diagnosis not present

## 2014-04-02 DIAGNOSIS — I5023 Acute on chronic systolic (congestive) heart failure: Secondary | ICD-10-CM | POA: Diagnosis not present

## 2014-04-02 DIAGNOSIS — I2510 Atherosclerotic heart disease of native coronary artery without angina pectoris: Secondary | ICD-10-CM | POA: Diagnosis not present

## 2014-04-02 DIAGNOSIS — I10 Essential (primary) hypertension: Secondary | ICD-10-CM | POA: Diagnosis not present

## 2014-04-02 DIAGNOSIS — R262 Difficulty in walking, not elsewhere classified: Secondary | ICD-10-CM | POA: Diagnosis not present

## 2014-04-07 DIAGNOSIS — I2510 Atherosclerotic heart disease of native coronary artery without angina pectoris: Secondary | ICD-10-CM | POA: Diagnosis not present

## 2014-04-07 DIAGNOSIS — E119 Type 2 diabetes mellitus without complications: Secondary | ICD-10-CM | POA: Diagnosis not present

## 2014-04-07 DIAGNOSIS — I10 Essential (primary) hypertension: Secondary | ICD-10-CM | POA: Diagnosis not present

## 2014-04-07 DIAGNOSIS — I5023 Acute on chronic systolic (congestive) heart failure: Secondary | ICD-10-CM | POA: Diagnosis not present

## 2014-04-07 DIAGNOSIS — J441 Chronic obstructive pulmonary disease with (acute) exacerbation: Secondary | ICD-10-CM | POA: Diagnosis not present

## 2014-04-07 DIAGNOSIS — R262 Difficulty in walking, not elsewhere classified: Secondary | ICD-10-CM | POA: Diagnosis not present

## 2014-04-08 NOTE — Assessment & Plan Note (Signed)
Okay to continue  With good compliance and controlCPAP

## 2014-04-08 NOTE — Assessment & Plan Note (Signed)
Managed by cardiology 

## 2014-04-08 NOTE — Assessment & Plan Note (Signed)
Plan-home nebulizer machine with albuterol

## 2014-04-09 DIAGNOSIS — R262 Difficulty in walking, not elsewhere classified: Secondary | ICD-10-CM | POA: Diagnosis not present

## 2014-04-09 DIAGNOSIS — I10 Essential (primary) hypertension: Secondary | ICD-10-CM | POA: Diagnosis not present

## 2014-04-09 DIAGNOSIS — I2510 Atherosclerotic heart disease of native coronary artery without angina pectoris: Secondary | ICD-10-CM | POA: Diagnosis not present

## 2014-04-09 DIAGNOSIS — J441 Chronic obstructive pulmonary disease with (acute) exacerbation: Secondary | ICD-10-CM | POA: Diagnosis not present

## 2014-04-09 DIAGNOSIS — I5023 Acute on chronic systolic (congestive) heart failure: Secondary | ICD-10-CM | POA: Diagnosis not present

## 2014-04-09 DIAGNOSIS — E119 Type 2 diabetes mellitus without complications: Secondary | ICD-10-CM | POA: Diagnosis not present

## 2014-04-10 DIAGNOSIS — I10 Essential (primary) hypertension: Secondary | ICD-10-CM | POA: Diagnosis not present

## 2014-04-10 DIAGNOSIS — R262 Difficulty in walking, not elsewhere classified: Secondary | ICD-10-CM | POA: Diagnosis not present

## 2014-04-10 DIAGNOSIS — E119 Type 2 diabetes mellitus without complications: Secondary | ICD-10-CM | POA: Diagnosis not present

## 2014-04-10 DIAGNOSIS — I2510 Atherosclerotic heart disease of native coronary artery without angina pectoris: Secondary | ICD-10-CM | POA: Diagnosis not present

## 2014-04-10 DIAGNOSIS — I5023 Acute on chronic systolic (congestive) heart failure: Secondary | ICD-10-CM | POA: Diagnosis not present

## 2014-04-10 DIAGNOSIS — J441 Chronic obstructive pulmonary disease with (acute) exacerbation: Secondary | ICD-10-CM | POA: Diagnosis not present

## 2014-05-06 ENCOUNTER — Encounter: Payer: Self-pay | Admitting: Cardiovascular Disease

## 2014-05-11 ENCOUNTER — Other Ambulatory Visit: Payer: Self-pay | Admitting: Adult Health

## 2014-05-12 ENCOUNTER — Ambulatory Visit (INDEPENDENT_AMBULATORY_CARE_PROVIDER_SITE_OTHER): Payer: Medicare Other | Admitting: Internal Medicine

## 2014-05-12 ENCOUNTER — Encounter: Payer: Self-pay | Admitting: Internal Medicine

## 2014-05-12 VITALS — BP 122/64 | HR 62 | Ht 68.0 in | Wt 255.6 lb

## 2014-05-12 DIAGNOSIS — I2510 Atherosclerotic heart disease of native coronary artery without angina pectoris: Secondary | ICD-10-CM

## 2014-05-12 DIAGNOSIS — G4733 Obstructive sleep apnea (adult) (pediatric): Secondary | ICD-10-CM | POA: Diagnosis not present

## 2014-05-12 DIAGNOSIS — J449 Chronic obstructive pulmonary disease, unspecified: Secondary | ICD-10-CM

## 2014-05-12 DIAGNOSIS — Z9989 Dependence on other enabling machines and devices: Secondary | ICD-10-CM

## 2014-05-12 NOTE — Progress Notes (Signed)
06/03/12- 74 yoM former smoker seen in pulmonary consultation at the request of Dr. Ronnald Ramp. Per Dr. Ronnald Ramp. Patient c/o sob and difficultly breathing when sitting while talking and walking.  Denies chest pain, chest tightness, wheeizng, and cough. Diagnosed with COPD 10 years ago. Wife is here. COPD Assessment Test (CAT) scored 24/40. He had smoked a pack a day half per day until 2005. 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 He was then diagnosed with hypothyroidism. Since then he has not exercised regularly, has felt weak with easier dyspnea on exertion. He denies cough or wheeze. Little change from day to day. His wife says he gets short of breath climbing 10 steps and he has to stop at the top. He has gained weight since thyroid diagnosis and with lack of exercise. He indicates 40 pound weight gain in the past 2 years. Coronary artery disease with history of multiple myocardial infarctions, congestive heart failure, hypertension, implanted AICD in left chest. No history of asthma. Ankles swell. Diagnosed obstructive sleep apnea/Dr. Kelly/CPAP/Advanced, unknown pressure. Anemia 4 years ago required transfusion. Uses Spiriva daily, occasional rescue Combivent./ MI/ AICD, OSA(Dr Kelly)  07/16/12- 66 yoM former smoker followed for dyspnea/ COPD, complicated by hx OSA/ Dr Claiborne Billings, CAD/ CHF/ MI/ AICD Wife is here today Found to be anemic and now treated with iron. Very short of breath over the weekend/blames weather, limiting dyspnea on exertion then but today feels "great". Using Spiriva with rare need for rescue albuterol. Says he is getting bronchitis less often than in the past. COPD assessment test (CAT) score 27/40 Complains of nasal stuffiness supine. Failed nasal strips. Sleeps sitting up because of this and uses saline nasal rinse. 6 minute walk test 07/16/2012: 95%, 97%, 98%, 216 m-ended test labored  for breath and fatigue but without desaturation. CXR 06/10/12 reviewed with them-bronchitis changes, CE/ASVD/CABG IMPRESSION:  1. Diffuse peribronchial cuffing. Given the patient's history of  prior smoking, this may suggest chronic bronchitis.  2. In addition, there is mild cardiomegaly with pulmonary venous  congestion, but no frank pulmonary edema at this time.  3. Status post median sternotomy for CABG.  4. Atherosclerosis.  Original Report Authenticated By: Etheleen Mayhew, M.D.  PFT: 07/16/2012-moderate to severe obstructive airways disease with air trapping, diffusion mildly reduced. FEV1/FVC 0.49. Insignificant response to bronchodilator. RV 128%, DLCO 69%.   12/20/12-  40 yoM former smoker followed for dyspnea/ COPD, complicated by hx OSA/ Dr Claiborne Billings, CAD/ CHF/ MI/ AICD     Wife here FOLLOWS FOR: breathing is unchanged since last OV. denies chest pain, chest tightness or coughing. reports increased SOB with extertion. Probably no change in dyspnea with exertion when he thinks in terms of the impact on ADLs. He says he joined weight watchers last night and plans to lose 100 pounds. His cardiologist has increased his diuretic. No routine cough, phlegm or wheeze. CXR 06/10/12 IMPRESSION:  1. Diffuse peribronchial cuffing. Given the patient's history of  prior smoking, this may suggest chronic bronchitis.  2. In addition, there is mild cardiomegaly with pulmonary venous  congestion, but no frank pulmonary edema at this time.  3. Status post median sternotomy for CABG.  4. Atherosclerosis.  Original Report Authenticated By: Etheleen Mayhew, M.D.  03/11/14- 21 yoM former smoker followed for dyspnea/ COPD, complicated by hx OSA/ Dr Claiborne Billings, CAD/ CHF/ MI/ AICD     Wife here Presents with SOB with any  exertion, chest congestion, nonprod cough.  Cardiologist recommended this visit yesterday.  Wears cpap [facility] nightly, 12 hours/night as well as during day.  Using rescue inhaler and  Spiriva. Has not qualified for home oxygen. 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 In early March and rhonchi this with green sputum. Office spirometry 03/11/14- weak/submaximal effort. By the numbers-severe obstructive airways disease and restriction of exhaled volume. FVC 2.49/59%, FEV1 1.29/89%, FEV1/FEC 0.5 to, FEF 25-75% 0.82/27%. CXR 03/02/14 IMPRESSION:  Improved lung aeration with resolving edema and atelectasis. No  pleural effusion.  Electronically Signed  By: Kalman Jewels M.D.  On: 03/02/2014 09:59  05/12/14- 58 yoM former smoker followed for dyspnea/ COPD, complicated by hx OSA/ Dr Claiborne Billings, CAD/ CHF/ MI/ AICD/CM/ AAA     Wife here FOLLOWS FOR: Wears CPAP/ [facility]/managed by Dr Claiborne Billings, every night for about 8-10 hours; DME is [facility]. Machine 69 yrs old. Patient would like me to manage CPAP/ sleep. Breathing well with nebs. Easy DOE. No cough on lisinopril.  ROS-see HPI Constitutional:   No-   weight loss, night sweats, fevers, chills, fatigue, lassitude. HEENT:   No-  headaches, difficulty swallowing, tooth/dental problems, sore throat,       No-  sneezing, itching, ear ache, +nasal congestion, post nasal drip,  CV:  No-   chest pain, +orthopnea, PND, +swelling in lower extremities, No-anasarca, dizziness, palpitations Resp: + shortness of breath with exertion or at rest.              No-   productive cough,  No non-productive cough,  No- coughing up of blood.              No-   change in color of mucus.  No- wheezing.   Skin: No-   rash or lesions. GI:  No-   heartburn, indigestion, abdominal pain, nausea, vomiting, GU: . MS:  No-   joint pain or swelling.  Neuro-     nothing unusual Psych:  No- change in mood or affect. No depression or anxiety.  No memory loss.  OBJ- Physical Exam General- Alert, Oriented, Affect-appropriate, Distress- none acute, obese, talkative Skin-+bruise Lymphadenopathy- none Head- atraumatic            Eyes-  Gross vision intact, PERRLA, conjunctivae and secretions clear            Ears- Hearing, canals-normal            Nose- +turbinate edema, no-Septal dev, mucus, polyps, erosion, perforation             Throat- Mallampati III, mucosa clear , drainage- none, tonsils- atrophic Neck- flexible , trachea midline, no stridor , thyroid nl, carotid no bruit Chest - symmetrical excursion , unlabored           Heart/CV- RRR , no murmur , no gallop  , no rub, nl s1 s2                           - JVD- none , edema 1+, stasis changes- none, varices- none           Lung- clear to P&A shallow inspiratory effort, rales- none, wheeze- none, cough- none ,                             dullness-none, rub- none,  Chest wall- sternotomy scar,  Pacemaker defibrillator L Abd-  Br/ Gen/ Rectal- Not done, not indicated Extrem- cyanosis- none, clubbing, none, atrophy- none, strength- nl, vein donor scars Neuro- head bob tremor

## 2014-05-12 NOTE — Patient Instructions (Signed)
Order- DME [facility]   Replacement for old CPAP machine, autotitrate   5-20 cwp x 7 days for pressure recommendation, mask of choice, humidifier, supplies  Dx OSA  Please call as needed

## 2014-05-27 ENCOUNTER — Ambulatory Visit (HOSPITAL_COMMUNITY)
Admission: RE | Admit: 2014-05-27 | Discharge: 2014-05-27 | Disposition: A | Discharge status: Home / Self Care | Payer: Medicare Other | Source: Ambulatory Visit | Attending: Internal Medicine | Admitting: Internal Medicine

## 2014-05-27 ENCOUNTER — Encounter (HOSPITAL_COMMUNITY): Payer: Self-pay

## 2014-05-27 VITALS — BP 116/58 | Ht 68.0 in | Wt 251.1 lb

## 2014-05-27 DIAGNOSIS — Z7982 Long term (current) use of aspirin: Secondary | ICD-10-CM | POA: Diagnosis not present

## 2014-05-27 DIAGNOSIS — Z951 Presence of aortocoronary bypass graft: Secondary | ICD-10-CM | POA: Insufficient documentation

## 2014-05-27 DIAGNOSIS — J4489 Other specified chronic obstructive pulmonary disease: Secondary | ICD-10-CM | POA: Insufficient documentation

## 2014-05-27 DIAGNOSIS — I472 Ventricular tachycardia, unspecified: Secondary | ICD-10-CM | POA: Insufficient documentation

## 2014-05-27 DIAGNOSIS — J449 Chronic obstructive pulmonary disease, unspecified: Secondary | ICD-10-CM | POA: Diagnosis not present

## 2014-05-27 DIAGNOSIS — I4729 Other ventricular tachycardia: Secondary | ICD-10-CM | POA: Insufficient documentation

## 2014-05-27 DIAGNOSIS — I5042 Chronic combined systolic (congestive) and diastolic (congestive) heart failure: Secondary | ICD-10-CM | POA: Diagnosis not present

## 2014-05-27 DIAGNOSIS — I5022 Chronic systolic (congestive) heart failure: Secondary | ICD-10-CM | POA: Insufficient documentation

## 2014-05-27 DIAGNOSIS — Z87891 Personal history of nicotine dependence: Secondary | ICD-10-CM | POA: Diagnosis not present

## 2014-05-27 DIAGNOSIS — I509 Heart failure, unspecified: Secondary | ICD-10-CM | POA: Diagnosis not present

## 2014-05-27 DIAGNOSIS — Z95810 Presence of automatic (implantable) cardiac defibrillator: Secondary | ICD-10-CM | POA: Insufficient documentation

## 2014-05-27 DIAGNOSIS — G4733 Obstructive sleep apnea (adult) (pediatric): Secondary | ICD-10-CM | POA: Insufficient documentation

## 2014-05-27 DIAGNOSIS — Z9989 Dependence on other enabling machines and devices: Secondary | ICD-10-CM | POA: Insufficient documentation

## 2014-05-27 DIAGNOSIS — I447 Left bundle-branch block, unspecified: Secondary | ICD-10-CM | POA: Diagnosis not present

## 2014-05-27 DIAGNOSIS — I2510 Atherosclerotic heart disease of native coronary artery without angina pectoris: Secondary | ICD-10-CM | POA: Diagnosis not present

## 2014-05-27 DIAGNOSIS — I428 Other cardiomyopathies: Secondary | ICD-10-CM | POA: Insufficient documentation

## 2014-05-27 LAB — BASIC METABOLIC PANEL
BUN: 39 mg/dL — ABNORMAL HIGH (ref 6–23)
CO2: 29 mEq/L (ref 19–32)
Calcium: 10.1 mg/dL (ref 8.4–10.5)
Chloride: 95 mEq/L — ABNORMAL LOW (ref 96–112)
Creatinine, Ser: 1.04 mg/dL (ref 0.50–1.35)
GFR calc Af Amer: 83 mL/min — ABNORMAL LOW (ref >90)
GFR calc non Af Amer: 71 mL/min — ABNORMAL LOW (ref >90)
Glucose, Bld: 112 mg/dL — ABNORMAL HIGH (ref 70–99)
Potassium: 4.7 mEq/L (ref 3.7–5.3)
Sodium: 138 mEq/L (ref 137–147)

## 2014-05-27 NOTE — Progress Notes (Signed)
Patient ID: [name], male   DOB: 09-May-1946, 68 y.o.   MRN: [id number]    05/27/2014 [name]   12-08-1946  [id number]  Primary Physicia Tyrone Calico, MD Primary Cardiologist: Dr. Debara Small  HPI:  [name] is a pleasant 68 y.o. male, followed by Dr. Debara Small, with a past medical history significant for morbid obesity, systolic heart failure with an EF of 30-35% (January 2014) -> 20-25% (March 2015 - RV normal), AICD placement, VT on amio, CAD s/p CABG 1996,LBBB, COPD, obstructive sleep apnea on CPAP, AAA repair (January 2015) who was admitted to [facility] on 02/24/14 for acute on chronic HF and acute COPD exacerbation.  He is referred to the HF Clinic for help with management of his HF. His discharge weight on 03/03/14 was 250 lbs.   Since discharge in March has been doing pretty well. Sleeps in bed with CPAP for 4 hours and then moves to recliner for another 2 hours. Weighs himself every morning. Weight usually 250-251. If weight up 3 pounds he will take extra lasix. Occurs about 1x/month. Has done well to restrict fluid intake and salt. Continues with DOE. When he goes to store usually uses a buggy for stability. No CP or dizziness. No LE edema. Says he has done everything he has been told to do except lose weight.    Labs 3/15 Cr 1.5 K 5.6   Current Outpatient Prescriptions  Medication Sig Dispense Refill  . albuterol (PROVENTIL HFA;VENTOLIN HFA) 108 (90 BASE) MCG/ACT inhaler Inhale 2 puffs into the lungs every 6 (six) hours as needed for wheezing or shortness of breath.  1 Inhaler  3  . albuterol (PROVENTIL) (2.5 MG/3ML) 0.083% nebulizer solution Take 3 mLs (2.5 mg total) by nebulization every 6 (six) hours as needed for wheezing or shortness of breath.  75 mL  12  . amiodarone (CORDARONE) 200 MG tablet Take 200 mg by mouth daily.       Marland Kitchen aspirin 81 MG tablet Take 81 mg by mouth daily.      Marland Kitchen atorvastatin (LIPITOR) 40 MG tablet Take 1 tablet (40 mg total) by mouth daily.  90 tablet  3  .  carvedilol (COREG) 25 MG tablet Take 1 tablet (25 mg total) by mouth 2 (two) times daily with a meal.  180 tablet  3  . clopidogrel (PLAVIX) 75 MG tablet Take 75 mg by mouth daily.        . finasteride (PROSCAR) 5 MG tablet Take 5 mg by mouth daily.      . fish oil-omega-3 fatty acids 1000 MG capsule Take 1 g by mouth daily.       . furosemide (LASIX) 80 MG tablet Take 1 tablet (80 mg total) by mouth 2 (two) times daily.  180 tablet  3  . isosorbide mononitrate (IMDUR) 15 mg TB24 24 hr tablet Take 7.5 mg by mouth at bedtime.      Marland Kitchen levothyroxine (SYNTHROID, LEVOTHROID) 50 MCG tablet TAKE 1 TABLET (50 MCG TOTAL) BY MOUTH DAILY.  90 tablet  1  . Multiple Vitamins-Minerals (MENS 50+ MULTI VITAMIN/MIN) TABS Take by mouth daily.      . multivitamin-lutein (OCUVITE-LUTEIN) CAPS Take 1 capsule by mouth daily.        . Nebulizers (COMPRESSOR/NEBULIZER) MISC Use as directed  1 each  0  . NITROSTAT 0.4 MG SL tablet Dissolve 1 tablet under the tongue every 5 minutes as  needed for chest pain (up  to 3 tabs in 15 min  then  call 911)  25 tablet  3  . NON FORMULARY at bedtime. CPAP And during the day as needed      . ramipril (ALTACE) 5 MG capsule Take 2.5 mg by mouth 2 (two) times daily.      Marland Kitchen spironolactone (ALDACTONE) 25 MG tablet Take 1 tablet (25 mg total) by mouth daily.  90 tablet  3   No current facility-administered medications for this encounter.    Allergies  Allergen Reactions  . Ace Inhibitors     REACTION: cough  . Penicillins Other (See Comments)    "childhood allergy"  . Tussionex Pennkinetic Er [Hydrocod Polst-Cpm Polst Er] Other (See Comments)    "caused prostate problems"    History   Social History  . Marital Status: Married    Spouse Name: [name]    Number of Children: 2  . Years of Education: [name]   Occupational History  . retired Solicitor     Social History Main Topics  . Smoking status: Former Smoker -- 1.50 packs/day for 40 years    Types: Cigarettes      Quit date: 12/11/2002  . Smokeless tobacco: Former Systems developer  . Alcohol Use: No  . Drug Use: No  . Sexual Activity: Not Currently   Other Topics Concern  . Not on file   Social History Narrative  . No narrative on file     Review of Systems: General: negative for chills, fever, night sweats or weight changes.  Cardiovascular: negative for chest pain, dyspnea on exertion, edema, orthopnea, palpitations, paroxysmal nocturnal dyspnea or shortness of breath Dermatological: negative for rash Respiratory: negative for cough or wheezing Urologic: negative for hematuria Abdominal: negative for nausea, vomiting, diarrhea, bright red blood per rectum, melena, or hematemesis Neurologic: negative for visual changes, syncope, or dizziness All other systems reviewed and are otherwise negative except as noted above.   Blood pressure 116/58, height 5\' 8"  (1.727 m), weight 251 lb 1.9 oz (113.907 kg).  General appearance: alert, cooperative and no distress Neck: no carotid bruit and no JVD. L CEA scar Lungs: clear no rales, but he does have decreased BS bilaterally  Heart: regular rate and rhythm, S1, S2 normal, 2/6 SEM at RUSB  click, rub or gallop Extremities: no LEE Pulses: 2+ and symmetric Skin: warm and dry Neurologic: Grossly normal   ASSESSMENT:  1. Chronic systolic HF EF 81-82% 2. CAD s/p CABG 3. Ischemic CM 4. VT s/p ICD - on amio 5. Morbid obesity 6. COPD 7. LBBB QRS 182ms  PLAN:  He has done extremely well with the HF education he was provided since his last admission. Volume status looks good. He is chronic NYHA III but I think a large part of this is due to his obesity. We discussed strategies to lose weight. We also discussed the possibility of CRT upgrade at some point but I don't think he needs it right now. Reinforced need for daily weights and reviewed use of sliding scale diuretics. Will not change any meds today. F/u in HF clinic in 4 months.   Tyrone Bickers  MD 05/27/2014 11:44 AM

## 2014-06-04 ENCOUNTER — Ambulatory Visit (INDEPENDENT_AMBULATORY_CARE_PROVIDER_SITE_OTHER): Payer: Medicare Other | Admitting: Internal Medicine

## 2014-06-04 ENCOUNTER — Ambulatory Visit (INDEPENDENT_AMBULATORY_CARE_PROVIDER_SITE_OTHER): Payer: Medicare Other | Admitting: *Deleted

## 2014-06-04 ENCOUNTER — Encounter: Payer: Self-pay | Admitting: Internal Medicine

## 2014-06-04 VITALS — BP 126/80 | HR 56 | Ht 68.0 in | Wt 257.6 lb

## 2014-06-04 DIAGNOSIS — I509 Heart failure, unspecified: Secondary | ICD-10-CM

## 2014-06-04 DIAGNOSIS — I2584 Coronary atherosclerosis due to calcified coronary lesion: Secondary | ICD-10-CM | POA: Diagnosis not present

## 2014-06-04 DIAGNOSIS — I10 Essential (primary) hypertension: Secondary | ICD-10-CM

## 2014-06-04 DIAGNOSIS — I658 Occlusion and stenosis of other precerebral arteries: Secondary | ICD-10-CM

## 2014-06-04 DIAGNOSIS — E785 Hyperlipidemia, unspecified: Secondary | ICD-10-CM

## 2014-06-04 DIAGNOSIS — Z95810 Presence of automatic (implantable) cardiac defibrillator: Secondary | ICD-10-CM | POA: Diagnosis not present

## 2014-06-04 DIAGNOSIS — I6529 Occlusion and stenosis of unspecified carotid artery: Secondary | ICD-10-CM

## 2014-06-04 DIAGNOSIS — I714 Abdominal aortic aneurysm, without rupture, unspecified: Secondary | ICD-10-CM

## 2014-06-04 DIAGNOSIS — I6523 Occlusion and stenosis of bilateral carotid arteries: Secondary | ICD-10-CM

## 2014-06-04 DIAGNOSIS — I5042 Chronic combined systolic (congestive) and diastolic (congestive) heart failure: Secondary | ICD-10-CM

## 2014-06-04 DIAGNOSIS — Z951 Presence of aortocoronary bypass graft: Secondary | ICD-10-CM

## 2014-06-04 DIAGNOSIS — R21 Rash and other nonspecific skin eruption: Secondary | ICD-10-CM

## 2014-06-04 DIAGNOSIS — I2589 Other forms of chronic ischemic heart disease: Secondary | ICD-10-CM

## 2014-06-04 DIAGNOSIS — G4733 Obstructive sleep apnea (adult) (pediatric): Secondary | ICD-10-CM

## 2014-06-04 DIAGNOSIS — I255 Ischemic cardiomyopathy: Secondary | ICD-10-CM

## 2014-06-04 DIAGNOSIS — I739 Peripheral vascular disease, unspecified: Secondary | ICD-10-CM | POA: Diagnosis not present

## 2014-06-04 DIAGNOSIS — I2510 Atherosclerotic heart disease of native coronary artery without angina pectoris: Secondary | ICD-10-CM

## 2014-06-04 DIAGNOSIS — Z9989 Dependence on other enabling machines and devices: Secondary | ICD-10-CM

## 2014-06-04 LAB — MDC_IDC_ENUM_SESS_TYPE_INCLINIC
Battery Remaining Longevity: 9.5
Brady Statistic RV Percent Paced: 1 % — CL
HighPow Impedance: 53 Ohm
Implantable Pulse Generator Serial Number: 265569
Lead Channel Impedance Value: 579 Ohm
Lead Channel Pacing Threshold Amplitude: 2.7 V
Lead Channel Pacing Threshold Pulse Width: 0.4 ms
Lead Channel Sensing Intrinsic Amplitude: 11.6 mV
Lead Channel Setting Pacing Amplitude: 4.5 V
Lead Channel Setting Pacing Pulse Width: 0.4 ms
Lead Channel Setting Sensing Sensitivity: 0.5 mV
Zone Setting Detection Interval: 272.7 ms
Zone Setting Detection Interval: 315.7 ms
Zone Setting Detection Interval: 375 ms

## 2014-06-04 MED ORDER — NYSTATIN-TRIAMCINOLONE 100000-0.1 UNIT/GM-% EX CREA: 1.0000 "application " | TOPICAL_CREAM | Freq: Two times a day (BID) | CUTANEOUS | Status: DC

## 2014-06-04 NOTE — Patient Instructions (Addendum)
Please schedule an appointment with Dr. Sallyanne Kuster for late September/early October (about 3 months) to establish management of your ICD. This will be your next device check date.   Your physician wants you to follow-up in: 6 months with Dr. Debara Pickett. You will receive a reminder letter in the mail two months in advance. If you don't receive a letter, please call our office to schedule the follow-up appointment.  Dr. Debara Pickett prescribed a topical cream to use twice daily to affected areas.

## 2014-06-04 NOTE — Progress Notes (Signed)
OFFICE NOTE  Chief Complaint:  Marked shortness of breath, cough, weight gain, fatigue, leg swelling  Primary Care Physician: Tyrone Calico, MD  HPI:  [name] is a 68 year old patient previously followed by [name]. He has extensive peripheral arterial disease (with bilateral iliac, CFA and renal stents - and numerous angioplasties), coronary disease, and exogenous obesity, remote cigarette abuse (quit in 2005) and past polypectomy. He had left CEA a year ago by [name] that was uncomplicated for high-grade asymptomatic carotid stenosis.  He has a known AAA which apparently has enlarged in size over the past year to 5.7 cm.  He has a known asymptomatic AAA followed by [name], and he has had moderate elevation of left renal velocities in the past.  He has remote asymptomatic right carotid occlusion, and his carotid Dopplers now and aneurysm are followed by [name] as outlined above. He has not had any chest pain, symptoms of ischemia or arrhythmia.  From a cardiac standpoint he has had CABG x2 in 1996, cath in 2008, had occlusion in both vein grafts from his initial CABG, but no significant LAD or diagonal disease, and he has had circumflex collaterals from the left. He had 70% in-stent restenosis of prior left renal artery stent, but he has had good blood pressures. His remote CABG was in 1986 - 2 SVG's to the PDA and LCX, both of which are chronically occluded. At his last cath in 2008 he basically had 40% DX2, 30% LAD beyond DX2, 100% old circumflex and 100% right with left-to-left and left-to-right collaterals.  Nuclear stress testing in 09/2012 was negative for ischemia, but showed a dense inferior scar and EF of 26%.  EF by last echo in 1/14 was approximately 30-35% with no significant valve disease. He had ICD implanted in 2006 for purposes of primary prevention with ischemic cardiomyopathy by [name], and he has had an EOL generator change in May of 2011. He has not had  any ICD shocks recently since being started on low dose amiodarone.   He was seen recently for preoperative cardiovascular risk assessment prior to elective AAA repair.  We did perform interrogation of his Copy single-chamber AICD.  This demonstrated no new shocks and greater than 10 years battery life. He was cleared for surgery and underwent stent grafting. This was apparently successful. At his last office visit he had reported worsening shortness of breath, productive cough, lower extremities swelling and marked shortness of breath. I recommended hospital admission in March and he was diuresed and treated for heart failure. He reports a marked improvement in his symptoms. He has since followed up with [name] in the advanced heart failure clinic. He was seen just last week and reports improvement in his symptoms and stable weight and heart failure. His weight today is actually up a few pounds, but he reports he has been eating more recently. He says he's never felt quite as good. There was some discussion about possible CRT therapy at some point in the future however it is felt that he does not need that at this time. Finally, [name] tells me that he recently got a new CPAP device which was programmed apparently on the lowest setting and he does not feel that it's effective. He is going to go back to Dr. Gwenette Small see if he can get a new prescription for the appropriate setting.  PMHx:  Past Medical History  Diagnosis Date  . Myocardial infarction   .  Hypertension   . CHF (congestive heart failure)   . COPD (chronic obstructive pulmonary disease)   . GERD (gastroesophageal reflux disease)   . AAA (abdominal aortic aneurysm)     followed by [name]  . Tremor   . Dysrhythmia     ICD-defibrillator  . Carotid artery stenosis     LCEA - [name] in 2013  . Peripheral vascular disease     LCEA, L renal artery stent, bilat iliac stents, R SFA stenosis  . Adenomatous  colon polyp 01/2004  . Diverticulosis   . AVM (arteriovenous malformation)   . CAD (coronary artery disease)     s/p CABGx2 in 1996  . HLD (hyperlipidemia)   . Hypothyroidism   . BPH (benign prostatic hypertrophy)   . Fatigue   . Automatic implantable cardioverter-defibrillator in situ   . OSA on CPAP     AHI durign total sleep 14.69/hr, during REM 50.91/hr  . H/O hiatal hernia   . Anemia     hx  . Complication of anesthesia     claustrophobic, unabe to lie on back more than 4 hours at time due to back  . Diabetes mellitus     DIET CONTROLLED  . Ischemic cardiomyopathy     Past Surgical History  Procedure Laterality Date  . Coronary artery bypass graft  11/04/1985    x2 - PDA and sequential DX-OM ([name])  . Cardiac catheterization  09/16/2007    occlusion of both vein grafts, no significant LAD disease or diagonal disease, Cfx collaterals from the left, 70% in-stent restenosis of L renal artery, normal L main, RCA occluded ostially (Dr. Adora Fridge)  . Coronary angioplasty  01/08/2004    cutting balloon atherectomy & percutaneous intervention of RCIA in-stent restenosis ([name])  . Renal artery stent Left 03/24/2004    6x68mm Genesis stent ([name])  . Angioplasty      BILATERAL  LE  W/STENTS  . Endarterectomy  01/04/2012    Procedure: ENDARTERECTOMY CAROTID;left  Surgeon: Tyrone Lenis, MD;  Location: [place];  Service: Vascular;  Laterality: Left;  with patch angioplasty  . Carotid endarterectomy Left 01/04/12  . Cardiac defibrillator placement  06/2005    Guidant Vitality HE - ischemic cardiomyopathy - [name]  . Iron infusion  June 16, 2012  . Abdominal aortic endovascular stent graft [name] 01/06/2014    Procedure: ABDOMINAL AORTIC ENDOVASCULAR STENT GRAFT- GORE; ULTRASOUND GUIDED;  Surgeon: Tyrone [place], MD;  Location: [place];  Service: Vascular;  Laterality: [name];  . Polypectomy    . Iliac artery stent Bilateral 03/1997    and L SFA PTA  . Icd  generator change  05/02/2010    [employer]  . Transthoracic echocardiogram  12/2012    EF 30-35; LV mod to severely dilated, mod concentric hypertrophy, severe hypokinesis of inferolateral myocardium, moderate hypokineis of anteroseptal region, grade 1 diastolic dysfunction; mod MR; LA mod-severely dialted; RV mod dialted; RA mildly dilated; PA peak pressure 57mmHg  . Nm myocar perf wall motion  09/2012    lexiscan myoview - mod-severe perfusion defect r/t infarct or scar w/mild periinfarct ishcemia in basal inferior, mid inferior, apical inferior, basal inferolateral & mid inferoalteral regions - EF 21% low risk scan  . Cardiac catheterization  10/03/2002    SVG sequentially to OM1 & OM2 totally occluded at ostium, SVG to PDA totally occluded within previously placed prox vein graft stent, smal distal AAA, bialt iliac stents  with 30% left end-stent restenosis and 50% right end-stent restnosis(Dr. Gerrie Nordmann)  . Cardiac catheterization  06/11/1998    L main with 20% narrowing in distal 1/3; LAD with 1st diagonla having 70% ostial narrowing, 2nd diagonal with 40% narrowing in prox third, LIMA & RIMA widely patent; in-stent restenosis of RCIA with successful PTA and new prox SVTRCA stent for residual disease ([name])    FAMHx:  Family History  Problem Relation Age of Onset  . Colon cancer Sister   . Diabetes Sister   . Heart disease Sister   . Heart disease Brother   . Lung cancer Mother   . Cancer Mother   . Heart attack Maternal Grandmother   . Colon polyps Sister   . Heart attack Father   . COPD Sister     SOCHx:   reports that he quit smoking about 11 years ago. His smoking use included Cigarettes. He has a 60 pack-year smoking history. He has quit using smokeless tobacco. He reports that he does not drink alcohol or use illicit drugs.  ALLERGIES:  Allergies  Allergen Reactions  . Ace Inhibitors     REACTION: cough  . Penicillins Other (See Comments)     "childhood allergy"  . Tussionex Pennkinetic Er [Hydrocod Polst-Cpm Polst Er] Other (See Comments)    "caused prostate problems"    ROS: A comprehensive review of systems was negative except for: Respiratory: positive for dyspnea on exertion Integument/breast: positive for rash  HOME MEDS: Current Outpatient Prescriptions  Medication Sig Dispense Refill  . albuterol (PROVENTIL HFA;VENTOLIN HFA) 108 (90 BASE) MCG/ACT inhaler Inhale 2 puffs into the lungs every 6 (six) hours as needed for wheezing or shortness of breath.  1 Inhaler  3  . albuterol (PROVENTIL) (2.5 MG/3ML) 0.083% nebulizer solution Take 3 mLs (2.5 mg total) by nebulization every 6 (six) hours as needed for wheezing or shortness of breath.  75 mL  12  . amiodarone (CORDARONE) 200 MG tablet Take 200 mg by mouth daily.       Marland Kitchen aspirin 81 MG tablet Take 81 mg by mouth daily.      Marland Kitchen atorvastatin (LIPITOR) 40 MG tablet Take 1 tablet (40 mg total) by mouth daily.  90 tablet  3  . carvedilol (COREG) 25 MG tablet Take 1 tablet (25 mg total) by mouth 2 (two) times daily with a meal.  180 tablet  3  . clopidogrel (PLAVIX) 75 MG tablet Take 75 mg by mouth daily.        . finasteride (PROSCAR) 5 MG tablet Take 5 mg by mouth daily.      . fish oil-omega-3 fatty acids 1000 MG capsule Take 1 g by mouth daily.       . furosemide (LASIX) 80 MG tablet Take 1 tablet (80 mg total) by mouth 2 (two) times daily.  180 tablet  3  . isosorbide mononitrate (IMDUR) 15 mg TB24 24 hr tablet Take 7.5 mg by mouth at bedtime.      Marland Kitchen levothyroxine (SYNTHROID, LEVOTHROID) 50 MCG tablet TAKE 1 TABLET (50 MCG TOTAL) BY MOUTH DAILY.  90 tablet  1  . Multiple Vitamins-Minerals (MENS 50+ MULTI VITAMIN/MIN) TABS Take by mouth daily.      . multivitamin-lutein (OCUVITE-LUTEIN) CAPS Take 1 capsule by mouth daily.        . Nebulizers (COMPRESSOR/NEBULIZER) MISC Use as directed  1 each  0  . NITROSTAT 0.4 MG SL tablet Dissolve 1 tablet under the tongue every  5 minutes  as  needed for chest pain (up  to 3 tabs in 15 min then  call 911)  25 tablet  3  . NON FORMULARY at bedtime. CPAP And during the day as needed      . nystatin-triamcinolone (MYCOLOG II) cream Apply 1 application topically 2 (two) times daily. To affected areas sparingly. Avoid face and genitals.  30 g  0  . ramipril (ALTACE) 5 MG capsule Take 2.5 mg by mouth 2 (two) times daily.      Marland Kitchen spironolactone (ALDACTONE) 25 MG tablet Take 1 tablet (25 mg total) by mouth daily.  90 tablet  3   No current facility-administered medications for this visit.    LABS/IMAGING: No results found for this or any previous visit (from the past 48 hour(s)). No results found.  VITALS: BP 126/80  Pulse 56  Ht 5\' 8"  (1.727 m)  Wt 257 lb 9.6 oz (116.847 kg)  BMI 39.18 kg/m2  EXAM: General appearance: alert, no distress and morbidly obese Neck: no carotid bruit and no JVD Lungs: clear to auscultation bilaterally Heart: regular rate and rhythm Abdomen: soft, non-tender; bowel sounds normal; no masses,  no organomegaly and obese Extremities: edema trace LLE pitting edema Pulses: 2+ and symmetric Skin: diffuse abdominal rash and lesions on the legs which are salmon colored and circumscribed Neurologic: Mental status: A&Ox3 Psych: Normal mood, affect  EKG: Sinus bradycardia with sinus arrhythmia at 56, left bundle branch block  Device interrogation: His function, no shocks since his last test 3 months ago, normal lead impedances, 9.5 years battery life, programmed VVI  ASSESSMENT: 1. Chronic systolic congestive heart failure, NYHA class II symptoms 2. Significant PAD (detailed history provided in HPI) with multiple interventions 3. AAA - status post recent EVAR 4. CAD  - s/p 2 vessel CABG in 1996 5. S/p AICD - single chamber [employer] Tel[employer] 6. Ischemic cardiomyopathy - EF 30-35% by echo in 12/2012 7. Myoview in 09/2012 - fixed inferior scar, no ischemia  PLAN: 1.   Mr. Baria is much more compensated  today with regards to his heart failure. His weight is up 6 pounds compared to his last office visit last week, but exam is not significant for heart failure. There is a small amount of pitting edema in the left lower extremity. He reports that he's been eating a little more than he had been recently. Probably because he feels so well. I would recommend continuing his current dose of diuretics. His ICD was interrogated today and shows normal function without any recent shocks. He maintains on amiodarone for prevention of ventricular arrhythmias. Plan is for followup in the heart failure clinic in 4 months and he will see a device clinic physician in 3 months. Therefore I will plan to see him in 6 months or sooner as necessary. Finally, he is reporting a pruritic, salmon-colored rash over his abdomen and lower extremities. I suspect this may be a topical fungal infection. I recommend a combination steroid/antifungal cream and have prescribed him to use that today sparingly.  Pixie Casino, MD, [facility] Attending Cardiologist [facility]  HILTY,Kenneth C 06/04/2014, 9:39 AM

## 2014-06-09 NOTE — Progress Notes (Signed)
ICD check in clinic (industry checked). Normal device function. Threshold and sensing consistent with previous device measurements. Impedance trends stable over time. No evidence of any ventricular arrhythmias. Histogram distribution appropriate for patient and level of activity. No changes made this session. Device programmed at appropriate safety margins. Device programmed to optimize intrinsic conduction. Estimated longevity 9.5 years. Plan to follow up with [facility] in 3 months.

## 2014-06-17 ENCOUNTER — Other Ambulatory Visit: Payer: Self-pay | Admitting: Cardiovascular Disease

## 2014-06-18 NOTE — Telephone Encounter (Signed)
Rx was sent to pharmacy electronically. 

## 2014-06-19 ENCOUNTER — Telehealth: Payer: Self-pay | Admitting: Internal Medicine

## 2014-06-19 MED ORDER — RAMIPRIL 5 MG PO CAPS: 5.0000 mg | ORAL_CAPSULE | Freq: Every day | ORAL | Status: DC

## 2014-06-19 MED ORDER — RAMIPRIL 2.5 MG PO CAPS: 2.5000 mg | ORAL_CAPSULE | Freq: Two times a day (BID) | ORAL | Status: DC

## 2014-06-19 NOTE — Telephone Encounter (Signed)
Needs to talk with someone regarding [name]'s medication.

## 2014-06-19 NOTE — Telephone Encounter (Signed)
Will defer to Dr. Debara Pickett to see if patient can be changed to ramipril 5mg  QD or if PA needs to be completed for BID dose.

## 2014-06-19 NOTE — Telephone Encounter (Signed)
10 day supply of medication sent to local pharmacy per wife request

## 2014-06-19 NOTE — Telephone Encounter (Signed)
Returned call to patient's wife. He needs prior authorization for ramipril 2.5mg  capsule BID.  PA number [id number]

## 2014-06-19 NOTE — Telephone Encounter (Signed)
5 mg daily should be fine.  Dr. Debara Pickett

## 2014-06-19 NOTE — Telephone Encounter (Signed)
Rx was sent to pharmacy electronically per Dr. Debara Pickett.   Wife informed. She will pick up 10 day supply at [facility] while waiting for mail order to come in.

## 2014-07-03 ENCOUNTER — Inpatient Hospital Stay (HOSPITAL_COMMUNITY)
Admission: EM | Admit: 2014-07-03 | Discharge: 2014-07-06 | DRG: 287 | Disposition: A | Discharge status: Home / Self Care | Payer: Medicare Other | Attending: Cardiology | Admitting: Cardiology

## 2014-07-03 ENCOUNTER — Encounter (HOSPITAL_COMMUNITY): Payer: Self-pay | Admitting: Emergency Medicine

## 2014-07-03 ENCOUNTER — Emergency Department (HOSPITAL_COMMUNITY): Payer: Medicare Other

## 2014-07-03 DIAGNOSIS — I739 Peripheral vascular disease, unspecified: Secondary | ICD-10-CM | POA: Diagnosis present

## 2014-07-03 DIAGNOSIS — Z7902 Long term (current) use of antithrombotics/antiplatelets: Secondary | ICD-10-CM | POA: Diagnosis not present

## 2014-07-03 DIAGNOSIS — I255 Ischemic cardiomyopathy: Secondary | ICD-10-CM

## 2014-07-03 DIAGNOSIS — J449 Chronic obstructive pulmonary disease, unspecified: Secondary | ICD-10-CM | POA: Diagnosis present

## 2014-07-03 DIAGNOSIS — G4733 Obstructive sleep apnea (adult) (pediatric): Secondary | ICD-10-CM | POA: Diagnosis present

## 2014-07-03 DIAGNOSIS — I5042 Chronic combined systolic (congestive) and diastolic (congestive) heart failure: Secondary | ICD-10-CM

## 2014-07-03 DIAGNOSIS — I5022 Chronic systolic (congestive) heart failure: Secondary | ICD-10-CM | POA: Diagnosis present

## 2014-07-03 DIAGNOSIS — E785 Hyperlipidemia, unspecified: Secondary | ICD-10-CM | POA: Diagnosis present

## 2014-07-03 DIAGNOSIS — Z95810 Presence of automatic (implantable) cardiac defibrillator: Secondary | ICD-10-CM | POA: Diagnosis not present

## 2014-07-03 DIAGNOSIS — R079 Chest pain, unspecified: Secondary | ICD-10-CM | POA: Diagnosis not present

## 2014-07-03 DIAGNOSIS — Z7982 Long term (current) use of aspirin: Secondary | ICD-10-CM

## 2014-07-03 DIAGNOSIS — I25810 Atherosclerosis of coronary artery bypass graft(s) without angina pectoris: Secondary | ICD-10-CM | POA: Diagnosis present

## 2014-07-03 DIAGNOSIS — Z79899 Other long term (current) drug therapy: Secondary | ICD-10-CM | POA: Diagnosis not present

## 2014-07-03 DIAGNOSIS — I2589 Other forms of chronic ischemic heart disease: Secondary | ICD-10-CM | POA: Diagnosis present

## 2014-07-03 DIAGNOSIS — F17200 Nicotine dependence, unspecified, uncomplicated: Secondary | ICD-10-CM | POA: Diagnosis present

## 2014-07-03 DIAGNOSIS — Z6839 Body mass index (BMI) 39.0-39.9, adult: Secondary | ICD-10-CM

## 2014-07-03 DIAGNOSIS — I059 Rheumatic mitral valve disease, unspecified: Secondary | ICD-10-CM | POA: Diagnosis present

## 2014-07-03 DIAGNOSIS — J4489 Other specified chronic obstructive pulmonary disease: Secondary | ICD-10-CM | POA: Diagnosis present

## 2014-07-03 DIAGNOSIS — N400 Enlarged prostate without lower urinary tract symptoms: Secondary | ICD-10-CM | POA: Diagnosis present

## 2014-07-03 DIAGNOSIS — I10 Essential (primary) hypertension: Secondary | ICD-10-CM

## 2014-07-03 DIAGNOSIS — I2510 Atherosclerotic heart disease of native coronary artery without angina pectoris: Secondary | ICD-10-CM | POA: Diagnosis present

## 2014-07-03 DIAGNOSIS — I200 Unstable angina: Secondary | ICD-10-CM

## 2014-07-03 DIAGNOSIS — E039 Hypothyroidism, unspecified: Secondary | ICD-10-CM | POA: Diagnosis present

## 2014-07-03 DIAGNOSIS — I509 Heart failure, unspecified: Secondary | ICD-10-CM

## 2014-07-03 DIAGNOSIS — I209 Angina pectoris, unspecified: Secondary | ICD-10-CM

## 2014-07-03 DIAGNOSIS — Z951 Presence of aortocoronary bypass graft: Secondary | ICD-10-CM

## 2014-07-03 LAB — MAGNESIUM: Magnesium: 2.1 mg/dL (ref 1.5–2.5)

## 2014-07-03 LAB — CBC
HCT: 43.6 % (ref 39.0–52.0)
Hemoglobin: 14.1 g/dL (ref 13.0–17.0)
MCH: 30.9 pg (ref 26.0–34.0)
MCHC: 32.3 g/dL (ref 30.0–36.0)
MCV: 95.4 fL (ref 78.0–100.0)
Platelets: 169 10*3/uL (ref 150–400)
RBC: 4.57 MIL/uL (ref 4.22–5.81)
RDW: 13.9 % (ref 11.5–15.5)
WBC: 6.4 10*3/uL (ref 4.0–10.5)

## 2014-07-03 LAB — BASIC METABOLIC PANEL
Anion gap: 14 (ref 5–15)
BUN: 16 mg/dL (ref 6–23)
CO2: 28 mEq/L (ref 19–32)
Calcium: 9.2 mg/dL (ref 8.4–10.5)
Chloride: 97 mEq/L (ref 96–112)
Creatinine, Ser: 0.85 mg/dL (ref 0.50–1.35)
GFR calc Af Amer: >90 mL/min (ref >90)
GFR calc non Af Amer: 87 mL/min — ABNORMAL LOW (ref >90)
Glucose, Bld: 106 mg/dL — ABNORMAL HIGH (ref 70–99)
Potassium: 4.7 mEq/L (ref 3.7–5.3)
Sodium: 139 mEq/L (ref 137–147)

## 2014-07-03 LAB — TSH: TSH: 6.64 u[IU]/mL — ABNORMAL HIGH (ref 0.350–4.500)

## 2014-07-03 LAB — I-STAT TROPONIN, ED: Troponin i, poc: 0.01 ng/mL (ref 0.00–0.08)

## 2014-07-03 LAB — PRO B NATRIURETIC PEPTIDE: Pro B Natriuretic peptide (BNP): 327.6 pg/mL — ABNORMAL HIGH (ref 0–125)

## 2014-07-03 LAB — PROTIME-INR
INR: 0.98 (ref 0.00–1.49)
Prothrombin Time: 13 seconds (ref 11.6–15.2)

## 2014-07-03 LAB — TROPONIN I: Troponin I: <0.3 ng/mL (ref <0.30)

## 2014-07-03 MED ORDER — FINASTERIDE 5 MG PO TABS
5.0000 mg | ORAL_TABLET | Freq: Every day | ORAL | Status: DC
  Administered 2014-07-03 – 2014-07-06 (×4): 5 mg via ORAL
  Filled 2014-07-03 (×4): qty 1

## 2014-07-03 MED ORDER — SODIUM CHLORIDE 0.9 % IJ SOLN
3.0000 mL | Freq: Two times a day (BID) | INTRAMUSCULAR | Status: DC
  Administered 2014-07-04 (×2): 3 mL via INTRAVENOUS

## 2014-07-03 MED ORDER — ASPIRIN 300 MG RE SUPP: 300.0000 mg | RECTAL | Status: AC

## 2014-07-03 MED ORDER — CARVEDILOL 25 MG PO TABS
25.0000 mg | ORAL_TABLET | Freq: Two times a day (BID) | ORAL | Status: DC
  Administered 2014-07-04 – 2014-07-06 (×5): 25 mg via ORAL
  Filled 2014-07-03 (×7): qty 1

## 2014-07-03 MED ORDER — ASPIRIN 81 MG PO CHEW
81.0000 mg | CHEWABLE_TABLET | Freq: Every day | ORAL | Status: DC
  Administered 2014-07-03: 81 mg via ORAL
  Filled 2014-07-03: qty 1

## 2014-07-03 MED ORDER — HEPARIN BOLUS VIA INFUSION
4000.0000 [IU] | Freq: Once | INTRAVENOUS | Status: AC
  Administered 2014-07-03: 4000 [IU] via INTRAVENOUS
  Filled 2014-07-03: qty 4000

## 2014-07-03 MED ORDER — ALBUTEROL SULFATE (2.5 MG/3ML) 0.083% IN NEBU: 2.5000 mg | INHALATION_SOLUTION | Freq: Four times a day (QID) | RESPIRATORY_TRACT | Status: DC | PRN

## 2014-07-03 MED ORDER — ATORVASTATIN CALCIUM 40 MG PO TABS
40.0000 mg | ORAL_TABLET | Freq: Every day | ORAL | Status: DC
  Administered 2014-07-04 – 2014-07-05 (×2): 40 mg via ORAL
  Filled 2014-07-03 (×3): qty 1

## 2014-07-03 MED ORDER — SODIUM CHLORIDE 0.9 % IV SOLN: 250.0000 mL | INTRAVENOUS | Status: DC | PRN

## 2014-07-03 MED ORDER — SODIUM CHLORIDE 0.9 % IJ SOLN: 3.0000 mL | INTRAMUSCULAR | Status: DC | PRN

## 2014-07-03 MED ORDER — CLOPIDOGREL BISULFATE 75 MG PO TABS
75.0000 mg | ORAL_TABLET | Freq: Every day | ORAL | Status: DC
  Administered 2014-07-04 – 2014-07-06 (×3): 75 mg via ORAL
  Filled 2014-07-03 (×3): qty 1

## 2014-07-03 MED ORDER — HEPARIN (PORCINE) IN NACL 100-0.45 UNIT/ML-% IJ SOLN
1400.0000 [IU]/h | INTRAMUSCULAR | Status: DC
  Administered 2014-07-03: 1300 [IU]/h via INTRAVENOUS
  Administered 2014-07-05 – 2014-07-06 (×2): 1400 [IU]/h via INTRAVENOUS
  Filled 2014-07-03 (×5): qty 250

## 2014-07-03 MED ORDER — ASPIRIN 81 MG PO CHEW: 81.0000 mg | CHEWABLE_TABLET | ORAL | Status: AC

## 2014-07-03 MED ORDER — LEVOTHYROXINE SODIUM 50 MCG PO TABS
50.0000 ug | ORAL_TABLET | Freq: Every day | ORAL | Status: DC
  Administered 2014-07-04 – 2014-07-05 (×2): 50 ug via ORAL
  Filled 2014-07-03 (×4): qty 1

## 2014-07-03 MED ORDER — NITROGLYCERIN 0.4 MG SL SUBL: 0.4000 mg | SUBLINGUAL_TABLET | SUBLINGUAL | Status: DC | PRN

## 2014-07-03 MED ORDER — FUROSEMIDE 80 MG PO TABS
80.0000 mg | ORAL_TABLET | Freq: Two times a day (BID) | ORAL | Status: DC
  Administered 2014-07-03 – 2014-07-05 (×5): 80 mg via ORAL
  Filled 2014-07-03 (×5): qty 1
  Filled 2014-07-03: qty 4
  Filled 2014-07-03 (×2): qty 1

## 2014-07-03 MED ORDER — ACETAMINOPHEN 325 MG PO TABS: 650.0000 mg | ORAL_TABLET | ORAL | Status: DC | PRN

## 2014-07-03 MED ORDER — ASPIRIN EC 81 MG PO TBEC
81.0000 mg | DELAYED_RELEASE_TABLET | Freq: Every day | ORAL | Status: DC
  Administered 2014-07-04 – 2014-07-05 (×2): 81 mg via ORAL
  Filled 2014-07-03 (×3): qty 1

## 2014-07-03 MED ORDER — ASPIRIN 81 MG PO TABS: 81.0000 mg | ORAL_TABLET | Freq: Every day | ORAL | Status: DC

## 2014-07-03 MED ORDER — OCUVITE-LUTEIN PO CAPS
1.0000 | ORAL_CAPSULE | Freq: Every day | ORAL | Status: DC
  Administered 2014-07-04 – 2014-07-06 (×3): 1 via ORAL
  Filled 2014-07-03 (×3): qty 1

## 2014-07-03 MED ORDER — ISOSORBIDE MONONITRATE ER 30 MG PO TB24
30.0000 mg | ORAL_TABLET | Freq: Every day | ORAL | Status: DC
  Administered 2014-07-03 – 2014-07-05 (×3): 30 mg via ORAL
  Filled 2014-07-03 (×4): qty 1

## 2014-07-03 MED ORDER — ALBUTEROL SULFATE HFA 108 (90 BASE) MCG/ACT IN AERS: 2.0000 | INHALATION_SPRAY | Freq: Four times a day (QID) | RESPIRATORY_TRACT | Status: DC | PRN

## 2014-07-03 MED ORDER — AMIODARONE HCL 200 MG PO TABS
200.0000 mg | ORAL_TABLET | Freq: Every day | ORAL | Status: DC
  Administered 2014-07-04 – 2014-07-06 (×3): 200 mg via ORAL
  Filled 2014-07-03 (×3): qty 1

## 2014-07-03 MED ORDER — ASPIRIN 81 MG PO CHEW
324.0000 mg | CHEWABLE_TABLET | Freq: Once | ORAL | Status: AC
  Administered 2014-07-03: 324 mg via ORAL
  Filled 2014-07-03: qty 4

## 2014-07-03 MED ORDER — ONDANSETRON HCL 4 MG/2ML IJ SOLN: 4.0000 mg | Freq: Four times a day (QID) | INTRAMUSCULAR | Status: DC | PRN

## 2014-07-03 MED ORDER — SODIUM CHLORIDE 0.9 % IJ SOLN: 3.0000 mL | Freq: Two times a day (BID) | INTRAMUSCULAR | Status: DC

## 2014-07-03 MED ORDER — ASPIRIN 81 MG PO CHEW
324.0000 mg | CHEWABLE_TABLET | ORAL | Status: AC
Start: 2014-07-03 — End: 2014-07-04

## 2014-07-03 MED ORDER — SPIRONOLACTONE 25 MG PO TABS
25.0000 mg | ORAL_TABLET | Freq: Every day | ORAL | Status: DC
  Administered 2014-07-03 – 2014-07-06 (×4): 25 mg via ORAL
  Filled 2014-07-03 (×4): qty 1

## 2014-07-03 MED ORDER — RAMIPRIL 5 MG PO CAPS
5.0000 mg | ORAL_CAPSULE | Freq: Every day | ORAL | Status: DC
  Administered 2014-07-03 – 2014-07-06 (×4): 5 mg via ORAL
  Filled 2014-07-03 (×4): qty 1

## 2014-07-03 NOTE — ED Notes (Signed)
Pt reports the flushed/burning feeling is now gone but he is having some metal taste in his mouth.

## 2014-07-03 NOTE — ED Notes (Addendum)
Per pt sts chest pain that started about 2 pm today. sts one attack and denies any pain now. sts weakness.  Pt SOB and has had 2 nitro since 2 pm

## 2014-07-03 NOTE — ED Provider Notes (Signed)
Medical screening examination/treatment/procedure(s) were conducted as a shared visit with non-physician practitioner(s) and myself.  I personally evaluated the patient during the encounter.   EKG Interpretation   Date/Time:  Friday July 03 2014 15:22:33 EDT Ventricular Rate:  64 PR Interval:  152 QRS Duration: 126 QT Interval:  472 QTC Calculation: 486 R Axis:   -27 Text Interpretation:  Normal sinus rhythm Left bundle branch block  Abnormal ECG No change compared to prior EKG Confirmed by Makaria Poarch,  DO,  Jillisa Harris [id number]) on 07/03/2014 3:49:37 PM      Pt is a 68 y.o. M with history of significant coronary artery disease status post CABG, 8 prior MIs who presents with symptoms concerning for unstable angina. He states that he had chest pain last night and today at 2:30 PM that was more intense than normal and associated with diaphoresis. This occurred at rest. He tried nitroglycerin but he states it took 30 minutes for his pain resolved but normally resolves and 3 minutes. He states that he felt like he was having a heart attack. He is currently chest pain-free. He is hemodynamically stable. Heart and lung sounds clear. No signs of volume overload. Abdomen soft nontender to palpation. EKG shows left bundle branch block with no acute ischemic changes. Troponin negative. Discuss with cardiology who will admit. They will start heparin for UA and pt will likely get a cardiac catheterization next week.   CRITICAL CARE Performed by: Nyra Jabs   Total critical care time: 30 minutes  Critical care time was exclusive of separately billable procedures and treating other patients.  Critical care was necessary to treat or prevent imminent or life-threatening deterioration.  Critical care was time spent personally by me on the following activities: development of treatment plan with patient and/or surrogate as well as nursing, discussions with consultants, evaluation of patient's response to  treatment, examination of patient, obtaining history from patient or surrogate, ordering and performing treatments and interventions, ordering and review of laboratory studies, ordering and review of radiographic studies, pulse oximetry and re-evaluation of patient's condition.   [place], DO 07/03/14 2050

## 2014-07-03 NOTE — ED Notes (Signed)
Attempted report 

## 2014-07-03 NOTE — ED Notes (Signed)
Attempt to call report.

## 2014-07-03 NOTE — ED Notes (Signed)
Heparin drip sent upstairs with patient, receiving RN, Crystal aware.

## 2014-07-03 NOTE — ED Notes (Signed)
Pt c/o sudden onset of "flushed/burning" feeling that is in his chest and abd. Pt had just gotten up to walk to the bathroom and when he came back the feeling started. Pt denies CP. VSS. Non-diaphoretic.

## 2014-07-03 NOTE — H&P (Signed)
Patient ID: JEFREY RABURN MRN: [id number], DOB/AGE: 1946/09/15   Admit date: 07/03/2014   Primary Physician: [name], MD Primary Cardiologist: Dr. Debara Pickett  Pt. Profile:  DORSE LOCY is a 68 y.o. male with a history of HTN, HLD, extensive PAD (with bilateral iliac, CFA and renal stents - and numerous angioplasties), CAD s/p CABG x2 in 1996, obesity, COPD with chronic dyspnea, tobacco abuse, carotid artery disease s/p CEA, AAA s/p recent stent grafting in 05/2693, chronic systolic CHF (EF 85-46%) with recent admission in march and now followed by [facility] and OSA on CPAP who presented to [facility] today with chest pain concerning for unstable angina.   From a cardiac standpoint he has had CABG x2 in 1996, cath in 2008, had occlusion in both vein grafts from his initial CABG, but no significant LAD or diagonal disease, and he has had circumflex collaterals from the left. He had 70% in-stent restenosis of prior left renal artery stent, but he has had good blood pressures. His remote CABG was in 1986 - 2 SVG's to the PDA and LCX, both of which are chronically occluded. At his last cath in 2008 he basically had 40% DX2, 30% LAD beyond DX2, 100% old circumflex and 100% right with left-to-left and left-to-right collaterals. Nuclear stress testing in 09/2012 was negative for ischemia, but showed a dense inferior scar and EF of 26%. EF by last echo in 1/14 was approximately 30-35% with no significant valve disease. He had ICD implanted in 2006 for purposes of primary prevention with ischemic cardiomyopathy by Dr. Rollene Fare, and he has had an EOL generator change in May of 2011. He has not had any ICD shocks recently since being started on low dose amiodarone for prevention of ventricular arrhythmias. ECHO 02/25/14 : EF 20-25% G2DD, severe global hypokinesis with inferior akinesis. Severe LA dilation, mild RA dilation, mild MR, PA pressure 61mmg Hg. 09/2012 lexiscan myoview - mod-severe perfusion defect r/t  infarct or scar w/mild periinfarct ishcemia in basal inferior, mid inferior, apical inferior, basal inferolateral & mid inferoalteral regions - EF 21% low risk scan   He has chronic angina responsive to SL NTG. Yesterday morning his heart felt "out of rhythm" and he felt hot and weak and also had a metallic taste in his mouth.  He then had an episode of CP and diaphoresis this morning and afternoon. However, this afternoon it was burning and it scared him. He felt a wave of burning throughout his body. He had an intense metallic taste in his mouth, which was reminiscent of the chest pain that he had before his previous MIs. He has been taking increasing amounts of SL NTG and it is taking longer to work. The chest pain has felt different than his normal angina. He has not had worsening SOB and has chronic dyspnea from COPD.   Problem List  Past Medical History  Diagnosis Date  . Myocardial infarction   . Hypertension   . CHF (congestive heart failure)   . COPD (chronic obstructive pulmonary disease)   . GERD (gastroesophageal reflux disease)   . AAA (abdominal aortic aneurysm)     followed by Dr. Bridgett Larsson  . Tremor   . Dysrhythmia     ICD-defibrillator  . Carotid artery stenosis     LCEA - Dr. Bridgett Larsson in 2013  . Peripheral vascular disease     LCEA, L renal artery stent, bilat iliac stents, R SFA stenosis  . Adenomatous colon polyp 01/2004  . Diverticulosis   .  AVM (arteriovenous malformation)   . CAD (coronary artery disease)     s/p CABGx2 in 1996  . HLD (hyperlipidemia)   . Hypothyroidism   . BPH (benign prostatic hypertrophy)   . Fatigue   . Automatic implantable cardioverter-defibrillator in situ   . OSA on CPAP     AHI durign total sleep 14.69/hr, during REM 50.91/hr  . H/O hiatal hernia   . Anemia     hx  . Complication of anesthesia     claustrophobic, unabe to lie on back more than 4 hours at time due to back  . Diabetes mellitus     DIET CONTROLLED  . Ischemic cardiomyopathy      Past Surgical History  Procedure Laterality Date  . Coronary artery bypass graft  11/04/1985    x2 - PDA and sequential DX-OM (Dr. Redmond Pulling)  . Cardiac catheterization  09/16/2007    occlusion of both vein grafts, no significant LAD disease or diagonal disease, Cfx collaterals from the left, 70% in-stent restenosis of L renal artery, normal L main, RCA occluded ostially (Dr. Adora Fridge)  . Coronary angioplasty  01/08/2004    cutting balloon atherectomy & percutaneous intervention of RCIA in-stent restenosis (Dr. Marella Chimes)  . Renal artery stent Left 03/24/2004    6x79mm Genesis stent (Dr. Marella Chimes)  . Angioplasty      BILATERAL  LE  W/STENTS  . Endarterectomy  01/04/2012    Procedure: ENDARTERECTOMY CAROTID;left  Surgeon: Hinda Lenis, MD;  Location: [place];  Service: Vascular;  Laterality: Left;  with patch angioplasty  . Carotid endarterectomy Left 01/04/12  . Cardiac defibrillator placement  06/2005    Guidant Vitality HE - ischemic cardiomyopathy - Dr. Marella Chimes  . Iron infusion  June 16, 2012  . Abdominal aortic endovascular stent graft [name] 01/06/2014    Procedure: ABDOMINAL AORTIC ENDOVASCULAR STENT GRAFT- GORE; ULTRASOUND GUIDED;  Surgeon: Conrad Pipestone, MD;  Location: [place];  Service: Vascular;  Laterality: [name];  . Polypectomy    . Iliac artery stent Bilateral 03/1997    and L SFA PTA  . Icd generator change  05/02/2010    [employer]  . Transthoracic echocardiogram  12/2012    EF 30-35; LV mod to severely dilated, mod concentric hypertrophy, severe hypokinesis of inferolateral myocardium, moderate hypokineis of anteroseptal region, grade 1 diastolic dysfunction; mod MR; LA mod-severely dialted; RV mod dialted; RA mildly dilated; PA peak pressure 70mmHg  . Nm myocar perf wall motion  09/2012    lexiscan myoview - mod-severe perfusion defect r/t infarct or scar w/mild periinfarct ishcemia in basal inferior, mid inferior, apical inferior, basal inferolateral &  mid inferoalteral regions - EF 21% low risk scan  . Cardiac catheterization  10/03/2002    SVG sequentially to OM1 & OM2 totally occluded at ostium, SVG to PDA totally occluded within previously placed prox vein graft stent, smal distal AAA, bialt iliac stents with 30% left end-stent restenosis and 50% right end-stent restnosis(Dr. Gerrie Nordmann)  . Cardiac catheterization  06/11/1998    L main with 20% narrowing in distal 1/3; LAD with 1st diagonla having 70% ostial narrowing, 2nd diagonal with 40% narrowing in prox third, LIMA & RIMA widely patent; in-stent restenosis of RCIA with successful PTA and new prox SVTRCA stent for residual disease (Dr. Marella Chimes)     Allergies  Allergies  Allergen Reactions  . Ace Inhibitors     REACTION: cough  . Penicillins Other (See Comments)    "  childhood allergy"  . Tussionex Pennkinetic Er [Hydrocod Polst-Cpm Polst Er] Other (See Comments)    "caused prostate problems"     Home Medications  Prior to Admission medications   Medication Sig Start Date End Date Taking? Authorizing Provider  albuterol (PROVENTIL HFA;VENTOLIN HFA) 108 (90 BASE) MCG/ACT inhaler Inhale 2 puffs into the lungs every 6 (six) hours as needed for wheezing or shortness of breath. 12/20/13   Raquel Rey, NP  albuterol (PROVENTIL) (2.5 MG/3ML) 0.083% nebulizer solution Take 3 mLs (2.5 mg total) by nebulization every 6 (six) hours as needed for wheezing or shortness of breath. 03/11/14   Deneise Lever, MD  amiodarone (CORDARONE) 200 MG tablet Take 200 mg by mouth daily.     Historical Provider, MD  aspirin 81 MG tablet Take 81 mg by mouth daily.    Historical Provider, MD  atorvastatin (LIPITOR) 40 MG tablet Take 1 tablet (40 mg total) by mouth daily. 08/20/13   Rebecca Eaton, MD  carvedilol (COREG) 25 MG tablet Take 1 tablet (25 mg total) by mouth 2 (two) times daily with a meal. 08/20/13   Rebecca Eaton, MD  clopidogrel (PLAVIX) 75 MG tablet Take 75 mg by mouth daily.       Historical Provider, MD  finasteride (PROSCAR) 5 MG tablet Take 5 mg by mouth daily.    Historical Provider, MD  fish oil-omega-3 fatty acids 1000 MG capsule Take 1 g by mouth daily.     Historical Provider, MD  furosemide (LASIX) 80 MG tablet Take 1 tablet (80 mg total) by mouth 2 (two) times daily. 08/20/13   Rebecca Eaton, MD  isosorbide mononitrate (IMDUR) 15 mg TB24 24 hr tablet Take 7.5 mg by mouth at bedtime. 03/02/14   Charlynne Cousins, MD  levothyroxine (SYNTHROID, LEVOTHROID) 50 MCG tablet TAKE 1 TABLET (50 MCG TOTAL) BY MOUTH DAILY. 03/17/14   [name], MD  Multiple Vitamins-Minerals (MENS 50+ MULTI VITAMIN/MIN) TABS Take by mouth daily.    Historical Provider, MD  multivitamin-lutein [facility]) CAPS Take 1 capsule by mouth daily.      Historical Provider, MD  Nebulizers (COMPRESSOR/NEBULIZER) MISC Use as directed 03/11/14   Deneise Lever, MD  NITROSTAT 0.4 MG SL tablet Dissolve 1 tablet under the tongue every 5 minutes as   needed for chest pain (up   to 3 tabs in 15 min then   call 911)    Pixie Casino, MD  NON FORMULARY at bedtime. CPAP And during the day as needed    Historical Provider, MD  nystatin-triamcinolone (MYCOLOG II) cream Apply 1 application topically 2 (two) times daily. To affected areas sparingly. Avoid face and genitals. 06/04/14   Pixie Casino, MD  ramipril (ALTACE) 5 MG capsule Take 1 capsule (5 mg total) by mouth daily. 06/19/14   Pixie Casino, MD  spironolactone (ALDACTONE) 25 MG tablet Take 1 tablet (25 mg total) by mouth daily. 08/20/13   Rebecca Eaton, MD    Family History  Family History  Problem Relation Age of Onset  . Colon cancer Sister   . Diabetes Sister   . Heart disease Sister   . Heart disease Brother   . Lung cancer Mother   . Cancer Mother   . Heart attack Maternal Grandmother   . Colon polyps Sister   . Heart attack Father   . COPD Sister    Family Status  Relation Status Death Age  . Mother Deceased     .  Father Deceased      Social History  History   Social History  . Marital Status: Married    Spouse Name: [name]    Number of Children: 2  . Years of Education: [name]   Occupational History  . retired Solicitor     Social History Main Topics  . Smoking status: Former Smoker -- 1.50 packs/day for 40 years    Types: Cigarettes    Quit date: 12/11/2002  . Smokeless tobacco: Former Systems developer  . Alcohol Use: No  . Drug Use: No  . Sexual Activity: Not Currently   Other Topics Concern  . Not on file   Social History Narrative  . No narrative on file     Review of Systems General:  No chills, fever, night sweats or weight changes.  Cardiovascular:  ++ chest pain, NO dyspnea on exertion, edema, orthopnea, palpitations, paroxysmal nocturnal dyspnea. Dermatological: No rash, lesions/masses Respiratory: No cough, dyspnea Urologic: No hematuria, dysuria Abdominal:   No nausea, vomiting, diarrhea, bright red blood per rectum, melena, or hematemesis Neurologic:  No visual changes, wkns, changes in mental status. All other systems reviewed and are otherwise negative except as noted above.  Physical Exam  Blood pressure 143/45, pulse 55, resp. rate 18, SpO2 96.00%.  General: Pleasant, NAD. obese Psych: Normal affect. Neuro: Alert and oriented X 3. Moves all extremities spontaneously. HEENT: Normal  Neck: Supple without bruits or JVD. R carotid bruit. Left CEA scar Lungs:  Resp regular and unlabored, CTA. Heart: RRR no s3, s4, or murmurs. Abdomen: Soft, non-tender, non-distended, BS + x 4.  Extremities: No clubbing, cyanosis or edema. Diminished pulses. Trace palpable pulse on the left. Difficult to feel on right.  Labs  No results found for this basename: CKTOTAL, CKMB, TROPONINI,  in the last 72 hours Lab Results  Component Value Date   WBC 6.4 07/03/2014   HGB 14.1 07/03/2014   HCT 43.6 07/03/2014   MCV 95.4 07/03/2014   PLT 169 07/03/2014    Recent Labs Lab  07/03/14 1531  NA 139  K 4.7  CL 97  CO2 28  BUN 16  CREATININE 0.85  CALCIUM 9.2  GLUCOSE 106*    Radiology/Studies  Dg Chest 2 View  07/03/2014   CLINICAL DATA:  Episode of chest pain earlier today relieved with nitroglycerin. Prior MI and CABG. Indwelling pacing defibrillator.  EXAM: CHEST  2 VIEW  COMPARISON:  03/02/2014, 02/14/2014, 12/19/2013, 03/13/2013.  FINDINGS: Prior sternotomy for CABG. Cardiac silhouette moderately enlarged but stable. Left subclavian pacing defibrillator unchanged and appears intact. Thoracic aorta mildly atherosclerotic, unchanged. Hilar and mediastinal contours otherwise unremarkable. Chronic mild pulmonary venous hypertension without overt edema. Lungs clear. No pleural effusions. No pneumothorax. Mild degenerative changes involving the thoracic spine.  IMPRESSION: Stable cardiomegaly.  No acute cardiopulmonary disease.     ECHO: Study Date: 02/25/2014 LV EF: 20% - 25% Study Conclusions - Left ventricle: The cavity size was moderately dilated. Wall thickness was normal. LV mass 224g. LVMI 94 g/m2 - RWT 0.27, therefore normal geometry. Systolic function was severely reduced. The estimated ejection fraction was in the range of 20% to 25%. Doppler parameters are consistent withpseduonormal left ventricular relaxation (grade 2 diastolic dysfunction). The E/A ratio is 1.5. The E/e' ratio is >15, suggesting markedly elevated LV filling pressure. Severe global hypokinesis with inferior akinesis. - Aortic valve: Mildly calcified leaflets. There was no stenosis. No regurgitation. - Mitral valve: Mildly thickened leaflets . Trivial regurgitation. - Left atrium: The  atrium is severely dilated (29cm2). - Right ventricle: The cavity size was normal. Wall thickness was normal. AICDwire noted in right ventricle. Systolic function was normal. - Right atrium: The atrium was mildly dilated (22 cm2). AICD wire noted in right atrium. - Tricuspid valve: Poorly  visualized. Mild regurgitation. - Pulmonary arteries: PA peak pressure: 57mm Hg (S). - Systemic veins: The IVC was not visualized.    ECG  Sinus with chronic LBBB and new TWI in v6. HR 64.  ASSESSMENT AND PLAN  ANTHONEY SHEPPARD is a 68 y.o. male with a history of HTN, HLD, extensive PAD (with bilateral iliac, CFA and renal stents - and numerous angioplasties), CAD s/p CABG x2 in 1996, obesity, tobacco abuse, carotid artery disease s/p CEA, AAA s/p recent stent grafting 06/622, chronic systolic CHF (EF 76-28%) with recent admission in March and now followed by [facility] and OSA on CPAP who presented to [facility] today with chest pain.   Chest pain- very concerning for [place] in the setting of known CAD, CP reminiscent of previous [place] and increasing SL NTG use. He has new TWI in V6.  -- CABG x2 in 1996, cath in 2008, had occlusion in both vein grafts from his initial CABG, but no significant LAD or diagonal disease, and he has had circumflex collaterals from the left. ( see note above) --Troponin x1 negative, ECG with no acute ST or TW changes --Will admit to telemtry for observation --Cycle Troponin and serial ECGs -- Will placed on IV heparin and plan for cath on Monday with Dr. Martinique  -- Continue ASA/Plavix, statin, BB and SL NTG. Will increase Imdur as BP has room   Chronic systolic CHF- CXR clear and BNP <500  -- Appear evolemic on exam. Continue Lasix 80mg  BID and aldactone 25mg  -- Continue Altace and Coreg  HTN- 143/45. Room to increase imdur  Ischemic CM- s/p ICD -- ECHO 02/25/14 : EF 20-25% G2DD, severe global hypokinesis with inferior akinesis. Severe LA dilation, mild RA dilation, mild MR, PA pressure 66mmg Hg. -- Has Product/process development scientist- will need interrogation tomorrow AM. -- Continue amiodarone for prevention of ventricular arrhythmias  OSA- will order CPAP  HLD- continued statin  Tyrell Antonio, PA-C 07/03/2014, 5:08 PM  Pager [phone] Patient seen and examined and  history reviewed. Agree with above findings and plan. Mr. Uecker is a pleasant 68 yo WM with extensive history of cardiovascular disease. He is s/p CABG x 2 in the distant past with known subsequent occlusion of of both SVGs. He has chronic total occlusion of the RCA and LCx. He is s/p PTCA of the ostium of the second diagonal in 2006. He now present with unstable angina. Marked increase in use of sl Ntg this week. He also has noted a couple of episodes of palpitations. Exam reveals a right carotid bruit. Left CEA scar. He is morbidly obese. Pedal pulses are poor. Initial troponin is negative. Ecg shows a chronic LBBB with new T wave inversion laterally. His history is classic for unstable angina. We will admit and place on IV heparin. Will increase oral nitrates. If chest pain recurs will add IV Ntg. Plan cardiac cath Monday. I reviewed prior cath films. Prior LCx and RCA CTOs are probably not suitable for PCI since collaterals are poor. Will also interrogate his ICD to make sure he is not having intermittent arrhythmias.  Peter Martinique, [place] 07/03/2014 6:18 PM

## 2014-07-03 NOTE — ED Notes (Signed)
Brought pt back to room via wheelchair with family in tow; pt undressed and in gown; placed pt on monitor; Neoma Laming, NT present in room now

## 2014-07-03 NOTE — ED Notes (Signed)
Pt returned from radiology.

## 2014-07-03 NOTE — ED Notes (Signed)
Admitting doc at bedside

## 2014-07-03 NOTE — ED Notes (Signed)
Cardiology at bedside.

## 2014-07-03 NOTE — Progress Notes (Signed)
ANTICOAGULATION CONSULT NOTE - Initial Consult  Pharmacy Consult for Heparin  Indication: chest pain/ACS  Allergies  Allergen Reactions  . Ace Inhibitors     REACTION: cough  . Penicillins Other (See Comments)    "childhood allergy"  . Tussionex Pennkinetic Er [Hydrocod Polst-Cpm Polst Er] Other (See Comments)    "caused prostate problems"    Patient Measurements: Height: 5' 8.11" (173 cm) Weight: 257 lb 8 oz (116.8 kg) IBW/kg (Calculated) : 68.65 Heparin Dosing Weight: 95 kg   Vital Signs: BP: 158/60 mmHg (07/24 1830) Pulse Rate: 57 (07/24 1844)  Labs:  Recent Labs  07/03/14 1531 07/03/14 1835  HGB 14.1  --   HCT 43.6  --   PLT 169  --   LABPROT  --  13.0  INR  --  0.98  CREATININE 0.85  --     Estimated Creatinine Clearance: 102 ml/min (by C-G formula based on Cr of 0.85).   Medical History: Past Medical History  Diagnosis Date  . Myocardial infarction   . Hypertension   . CHF (congestive heart failure)   . COPD (chronic obstructive pulmonary disease)   . GERD (gastroesophageal reflux disease)   . AAA (abdominal aortic aneurysm)     followed by Dr. Bridgett Larsson  . Tremor   . Dysrhythmia     ICD-defibrillator  . Carotid artery stenosis     LCEA - Dr. Bridgett Larsson in 2013  . Peripheral vascular disease     LCEA, L renal artery stent, bilat iliac stents, R SFA stenosis  . Adenomatous colon polyp 01/2004  . Diverticulosis   . AVM (arteriovenous malformation)   . CAD (coronary artery disease)     s/p CABGx2 in 1996  . HLD (hyperlipidemia)   . Hypothyroidism   . BPH (benign prostatic hypertrophy)   . Fatigue   . Automatic implantable cardioverter-defibrillator in situ   . OSA on CPAP     AHI durign total sleep 14.69/hr, during REM 50.91/hr  . H/O hiatal hernia   . Anemia     hx  . Complication of anesthesia     claustrophobic, unabe to lie on back more than 4 hours at time due to back  . Diabetes mellitus     DIET CONTROLLED  . Ischemic cardiomyopathy      Medications:   (Not in a hospital admission)  Assessment: 51 YOM with extensive cardiac history presented to the ED for chest pain. Initial troponin is negative. CrCl ~ 102 mL/min. Pharmacy consulted to start heparin infusion for ACS. Pt was not on any anticoagulation prior to admission.   Goal of Therapy:  Heparin level 0.3-0.7 units/ml Monitor platelets by anticoagulation protocol: Yes   Plan:  -Given heparin bolus of 4000 units/hr followed by heparin infusion at 1300 units/hr  -F/u 6 hour level -Monitor daily HL, CBC, and s/s of bleeding   Albertina Parr, PharmD.  Clinical Pharmacist Pager [phone]

## 2014-07-03 NOTE — ED Provider Notes (Signed)
CSN: [id number]     Arrival date & time 07/03/14  1520 History   First MD Initiated Contact with Patient 07/03/14 1534     Chief Complaint  Patient presents with  . Chest Pain     (Consider location/radiation/quality/duration/timing/severity/associated sxs/prior Treatment) Patient is a 68 y.o. male presenting with chest pain. The history is provided by the patient and medical records.  Chest Pain Associated symptoms: diaphoresis    This is a 68 year old male with extensive cardiac history including hypertension, COPD, congestive heart failure, CAD, prior MI x8 with stenting and CABG, presenting to the ED for chest pain.  Patient states he had an episode of chest pain yesterday afternoon, states it was more intense than his regular angina.  Took 1 SL NTG with complete resolution of pain immediately.  States pain occurred again yesterday evening, slightly more intense, with a metal taste present in his mouth but again relieved with NTG.  States slept well throughout the night and felt well upon waking. States around 1430 this afternoon he developed intense left-sided chest pain associated with shortness of breath, diaphoresis, chills, and a strong metal taste in his mouth. He states he took 2 sublingual nitroglycerin but it took 40 minutes before his pain resolved which is abnormal. He is concerned that he has experienced another MI.  Currently, patient is asymptomatic.  Pt is followed by Dr. Debara Pickett and Dr. Rollene Fare.    Past Medical History  Diagnosis Date  . Myocardial infarction   . Hypertension   . CHF (congestive heart failure)   . COPD (chronic obstructive pulmonary disease)   . GERD (gastroesophageal reflux disease)   . AAA (abdominal aortic aneurysm)     followed by Dr. Bridgett Larsson  . Tremor   . Dysrhythmia     ICD-defibrillator  . Carotid artery stenosis     LCEA - Dr. Bridgett Larsson in 2013  . Peripheral vascular disease     LCEA, L renal artery stent, bilat iliac stents, R SFA stenosis  .  Adenomatous colon polyp 01/2004  . Diverticulosis   . AVM (arteriovenous malformation)   . CAD (coronary artery disease)     s/p CABGx2 in 1996  . HLD (hyperlipidemia)   . Hypothyroidism   . BPH (benign prostatic hypertrophy)   . Fatigue   . Automatic implantable cardioverter-defibrillator in situ   . OSA on CPAP     AHI durign total sleep 14.69/hr, during REM 50.91/hr  . H/O hiatal hernia   . Anemia     hx  . Complication of anesthesia     claustrophobic, unabe to lie on back more than 4 hours at time due to back  . Diabetes mellitus     DIET CONTROLLED  . Ischemic cardiomyopathy    Past Surgical History  Procedure Laterality Date  . Coronary artery bypass graft  11/04/1985    x2 - PDA and sequential DX-OM (Dr. Redmond Pulling)  . Cardiac catheterization  09/16/2007    occlusion of both vein grafts, no significant LAD disease or diagonal disease, Cfx collaterals from the left, 70% in-stent restenosis of L renal artery, normal L main, RCA occluded ostially (Dr. Adora Fridge)  . Coronary angioplasty  01/08/2004    cutting balloon atherectomy & percutaneous intervention of RCIA in-stent restenosis (Dr. Marella Chimes)  . Renal artery stent Left 03/24/2004    6x31mm Genesis stent (Dr. Marella Chimes)  . Angioplasty      BILATERAL  LE  W/STENTS  . Endarterectomy  01/04/2012  Procedure: ENDARTERECTOMY CAROTID;left  Surgeon: Hinda Lenis, MD;  Location: [place];  Service: Vascular;  Laterality: Left;  with patch angioplasty  . Carotid endarterectomy Left 01/04/12  . Cardiac defibrillator placement  06/2005    Guidant Vitality HE - ischemic cardiomyopathy - Dr. Marella Chimes  . Iron infusion  June 16, 2012  . Abdominal aortic endovascular stent graft [name] 01/06/2014    Procedure: ABDOMINAL AORTIC ENDOVASCULAR STENT GRAFT- GORE; ULTRASOUND GUIDED;  Surgeon: Conrad Higden, MD;  Location: [place];  Service: Vascular;  Laterality: [name];  . Polypectomy    . Iliac artery stent Bilateral 03/1997    and L SFA PTA   . Icd generator change  05/02/2010    [employer]  . Transthoracic echocardiogram  12/2012    EF 30-35; LV mod to severely dilated, mod concentric hypertrophy, severe hypokinesis of inferolateral myocardium, moderate hypokineis of anteroseptal region, grade 1 diastolic dysfunction; mod MR; LA mod-severely dialted; RV mod dialted; RA mildly dilated; PA peak pressure 30mmHg  . Nm myocar perf wall motion  09/2012    lexiscan myoview - mod-severe perfusion defect r/t infarct or scar w/mild periinfarct ishcemia in basal inferior, mid inferior, apical inferior, basal inferolateral & mid inferoalteral regions - EF 21% low risk scan  . Cardiac catheterization  10/03/2002    SVG sequentially to OM1 & OM2 totally occluded at ostium, SVG to PDA totally occluded within previously placed prox vein graft stent, smal distal AAA, bialt iliac stents with 30% left end-stent restenosis and 50% right end-stent restnosis(Dr. Gerrie Nordmann)  . Cardiac catheterization  06/11/1998    L main with 20% narrowing in distal 1/3; LAD with 1st diagonla having 70% ostial narrowing, 2nd diagonal with 40% narrowing in prox third, LIMA & RIMA widely patent; in-stent restenosis of RCIA with successful PTA and new prox SVTRCA stent for residual disease (Dr. Marella Chimes)   Family History  Problem Relation Age of Onset  . Colon cancer Sister   . Diabetes Sister   . Heart disease Sister   . Heart disease Brother   . Lung cancer Mother   . Cancer Mother   . Heart attack Maternal Grandmother   . Colon polyps Sister   . Heart attack Father   . COPD Sister    History  Substance Use Topics  . Smoking status: Former Smoker -- 1.50 packs/day for 40 years    Types: Cigarettes    Quit date: 12/11/2002  . Smokeless tobacco: Former Systems developer  . Alcohol Use: No    Review of Systems  Constitutional: Positive for diaphoresis.  Cardiovascular: Positive for chest pain.  All other systems reviewed and are  negative.     Allergies  Ace inhibitors; Penicillins; and Tussionex pennkinetic er  Home Medications   Prior to Admission medications   Medication Sig Start Date End Date Taking? Authorizing Provider  albuterol (PROVENTIL HFA;VENTOLIN HFA) 108 (90 BASE) MCG/ACT inhaler Inhale 2 puffs into the lungs every 6 (six) hours as needed for wheezing or shortness of breath. 12/20/13   Raquel Rey, NP  albuterol (PROVENTIL) (2.5 MG/3ML) 0.083% nebulizer solution Take 3 mLs (2.5 mg total) by nebulization every 6 (six) hours as needed for wheezing or shortness of breath. 03/11/14   Deneise Lever, MD  amiodarone (CORDARONE) 200 MG tablet Take 200 mg by mouth daily.     Historical Provider, MD  aspirin 81 MG tablet Take 81 mg by mouth daily.    Historical Provider, MD  atorvastatin (LIPITOR)  40 MG tablet Take 1 tablet (40 mg total) by mouth daily. 08/20/13   Rebecca Eaton, MD  carvedilol (COREG) 25 MG tablet Take 1 tablet (25 mg total) by mouth 2 (two) times daily with a meal. 08/20/13   Rebecca Eaton, MD  clopidogrel (PLAVIX) 75 MG tablet Take 75 mg by mouth daily.      Historical Provider, MD  finasteride (PROSCAR) 5 MG tablet Take 5 mg by mouth daily.    Historical Provider, MD  fish oil-omega-3 fatty acids 1000 MG capsule Take 1 g by mouth daily.     Historical Provider, MD  furosemide (LASIX) 80 MG tablet Take 1 tablet (80 mg total) by mouth 2 (two) times daily. 08/20/13   Rebecca Eaton, MD  isosorbide mononitrate (IMDUR) 15 mg TB24 24 hr tablet Take 7.5 mg by mouth at bedtime. 03/02/14   Charlynne Cousins, MD  levothyroxine (SYNTHROID, LEVOTHROID) 50 MCG tablet TAKE 1 TABLET (50 MCG TOTAL) BY MOUTH DAILY. 03/17/14   [name], MD  Multiple Vitamins-Minerals (MENS 50+ MULTI VITAMIN/MIN) TABS Take by mouth daily.    Historical Provider, MD  multivitamin-lutein [facility]) CAPS Take 1 capsule by mouth daily.      Historical Provider, MD  Nebulizers (COMPRESSOR/NEBULIZER) MISC Use  as directed 03/11/14   Deneise Lever, MD  NITROSTAT 0.4 MG SL tablet Dissolve 1 tablet under the tongue every 5 minutes as   needed for chest pain (up   to 3 tabs in 15 min then   call 911)    Pixie Casino, MD  NON FORMULARY at bedtime. CPAP And during the day as needed    Historical Provider, MD  nystatin-triamcinolone (MYCOLOG II) cream Apply 1 application topically 2 (two) times daily. To affected areas sparingly. Avoid face and genitals. 06/04/14   Pixie Casino, MD  ramipril (ALTACE) 5 MG capsule Take 1 capsule (5 mg total) by mouth daily. 06/19/14   Pixie Casino, MD  spironolactone (ALDACTONE) 25 MG tablet Take 1 tablet (25 mg total) by mouth daily. 08/20/13   Rebecca Eaton, MD   BP 147/76  Pulse 60  Resp 16  Ht 5' 8.11" (1.73 m)  Wt 257 lb 8 oz (116.8 kg)  BMI 39.03 kg/m2  SpO2 95%  Physical Exam  Nursing note and vitals reviewed. Constitutional: He is oriented to person, place, and time. He appears well-developed and well-nourished. No distress.  HENT:  Head: Normocephalic and atraumatic.  Mouth/Throat: Oropharynx is clear and moist.  Eyes: Conjunctivae and EOM are normal. Pupils are equal, round, and reactive to light.  Neck: Normal range of motion. Neck supple.  Cardiovascular: Normal rate, regular rhythm and normal heart sounds.   Pulmonary/Chest: Effort normal and breath sounds normal. No respiratory distress. He has no wheezes.  Abdominal: Soft. Bowel sounds are normal. There is no tenderness. There is no guarding.  Musculoskeletal: Normal range of motion. He exhibits no edema.  Trace edema BLE  Neurological: He is alert and oriented to person, place, and time.  Tremors of bilateral hands (baseline)  Skin: Skin is warm and dry. He is not diaphoretic.  Psychiatric: He has a normal mood and affect.    ED Course  Procedures (including critical care time)  CRITICAL CARE Performed by: Larene Pickett   Total critical care time: 35  Critical care time  was exclusive of separately billable procedures and treating other patients.  Critical care was necessary to treat or prevent imminent or  life-threatening deterioration.  Critical care was time spent personally by me on the following activities: development of treatment plan with patient and/or surrogate as well as nursing, discussions with consultants, evaluation of patient's response to treatment, examination of patient, obtaining history from patient or surrogate, ordering and performing treatments and interventions, ordering and review of laboratory studies, ordering and review of radiographic studies, pulse oximetry and re-evaluation of patient's condition.  Medications  atorvastatin (LIPITOR) tablet 40 mg (not administered)  carvedilol (COREG) tablet 25 mg (not administered)  furosemide (LASIX) tablet 80 mg (80 mg Oral Given 07/03/14 1851)  0.9 %  sodium chloride infusion (not administered)  nitroGLYCERIN (NITROSTAT) SL tablet 0.4 mg (not administered)  ondansetron (ZOFRAN) injection 4 mg (not administered)  heparin bolus via infusion 4,000 Units (not administered)  heparin ADULT infusion 100 units/mL (25000 units/250 mL) (not administered)  aspirin chewable tablet 324 mg (324 mg Oral Given 07/03/14 1624)    Labs Review Labs Reviewed  BASIC METABOLIC PANEL - Abnormal; Notable for the following:    Glucose, Bld 106 (*)    GFR calc non Af Amer 87 (*)    All other components within normal limits  PRO B NATRIURETIC PEPTIDE - Abnormal; Notable for the following:    Pro B Natriuretic peptide (BNP) 327.6 (*)    All other components within normal limits  CBC  PROTIME-INR  MAGNESIUM  COMPREHENSIVE METABOLIC PANEL  LIPID PANEL  CBC  HEPARIN LEVEL (UNFRACTIONATED)  I-STAT TROPOININ, ED    Imaging Review No results found.   EKG Interpretation   Date/Time:  Friday July 03 2014 15:22:33 EDT Ventricular Rate:  64 PR Interval:  152 QRS Duration: 126 QT Interval:  472 QTC  Calculation: 486 R Axis:   -27 Text Interpretation:  Normal sinus rhythm Left bundle branch block  Abnormal ECG No change compared to prior EKG Confirmed by WARD,  DO,  KRISTEN ([id number]) on 07/03/2014 3:49:37 PM      MDM   Final diagnoses:  Unstable angina  Chest pain, unspecified chest pain type   68 year old male presenting with intermittent chest pain over the past 24 hours, atypical for him. On arrival, he is asymptomatic however his story is concerning for unstable angina. EKG was obtained with left bundle branch block, no significant changes.  We'll obtain basic labs, troponin, chest x-ray. Aspirin given.  Lab work is reassuring. Troponin is negative.  Chest x-ray is clear. Patient remains pain-free while in the emergency department. Case was discussed with cardiology, who has evaluated patient in the ED, agrees with concern for unstable angina and has agreed to admit.  Pt was started on heparin, plan for cardiac catheterization to be performed on Monday.  VS remain stable at this time.  Larene Pickett, PA-C 07/03/14 1939

## 2014-07-03 NOTE — ED Notes (Signed)
Heart Health diet has been ordered.

## 2014-07-04 LAB — CBC
HCT: 40.4 % (ref 39.0–52.0)
Hemoglobin: 13 g/dL (ref 13.0–17.0)
MCH: 30.1 pg (ref 26.0–34.0)
MCHC: 32.2 g/dL (ref 30.0–36.0)
MCV: 93.5 fL (ref 78.0–100.0)
Platelets: 163 10*3/uL (ref 150–400)
RBC: 4.32 MIL/uL (ref 4.22–5.81)
RDW: 13.8 % (ref 11.5–15.5)
WBC: 5.7 10*3/uL (ref 4.0–10.5)

## 2014-07-04 LAB — COMPREHENSIVE METABOLIC PANEL
ALT: 23 U/L (ref 0–53)
AST: 22 U/L (ref 0–37)
Albumin: 3.7 g/dL (ref 3.5–5.2)
Alkaline Phosphatase: 69 U/L (ref 39–117)
Anion gap: 12 (ref 5–15)
BUN: 19 mg/dL (ref 6–23)
CO2: 29 mEq/L (ref 19–32)
Calcium: 9.1 mg/dL (ref 8.4–10.5)
Chloride: 100 mEq/L (ref 96–112)
Creatinine, Ser: 1.04 mg/dL (ref 0.50–1.35)
GFR calc Af Amer: 83 mL/min — ABNORMAL LOW (ref >90)
GFR calc non Af Amer: 71 mL/min — ABNORMAL LOW (ref >90)
Glucose, Bld: 99 mg/dL (ref 70–99)
Potassium: 4.1 mEq/L (ref 3.7–5.3)
Sodium: 141 mEq/L (ref 137–147)
Total Bilirubin: 0.5 mg/dL (ref 0.3–1.2)
Total Protein: 6.7 g/dL (ref 6.0–8.3)

## 2014-07-04 LAB — PROTIME-INR
INR: 1.05 (ref 0.00–1.49)
Prothrombin Time: 13.7 seconds (ref 11.6–15.2)

## 2014-07-04 LAB — TROPONIN I
Troponin I: <0.3 ng/mL (ref <0.30)
Troponin I: <0.3 ng/mL (ref <0.30)

## 2014-07-04 LAB — LIPID PANEL
Cholesterol: 137 mg/dL (ref 0–200)
HDL: 44 mg/dL (ref >39)
LDL Cholesterol: 68 mg/dL (ref 0–99)
Total CHOL/HDL Ratio: 3.1 RATIO
Triglycerides: 127 mg/dL (ref <150)
VLDL: 25 mg/dL (ref 0–40)

## 2014-07-04 LAB — HEPARIN LEVEL (UNFRACTIONATED)
Heparin Unfractionated: 0.32 IU/mL (ref 0.30–0.70)
Heparin Unfractionated: 0.38 IU/mL (ref 0.30–0.70)

## 2014-07-04 NOTE — Progress Notes (Signed)
ANTICOAGULATION CONSULT NOTE - Follow Up Consult  Pharmacy Consult for heparin Indication: chest pain/ACS  Allergies  Allergen Reactions  . Ace Inhibitors     REACTION: cough  . Penicillins Other (See Comments)    "childhood allergy"  . Tussionex Pennkinetic Er [Hydrocod Polst-Cpm Polst Er] Other (See Comments)    "caused prostate problems"    Patient Measurements: Height: 5\' 8"  (172.7 cm) Weight: 250 lb 3.6 oz (113.5 kg) IBW/kg (Calculated) : 68.4 Heparin Dosing Weight: 95 kg  Vital Signs: Temp: 98.1 F (36.7 C) (07/25 0547) Temp src: Oral (07/25 0547) BP: 118/53 mmHg (07/25 0547) Pulse Rate: 58 (07/25 0547)  Labs:  Recent Labs  07/03/14 1531 07/03/14 1835 07/03/14 2135 07/04/14 0135  HGB 14.1  --   --  13.0  HCT 43.6  --   --  40.4  PLT 169  --   --  163  LABPROT  --  13.0  --  13.7  INR  --  0.98  --  1.05  HEPARINUNFRC  --   --   --  0.32  CREATININE 0.85  --   --  1.04  TROPONINI  --   --  <0.30 <0.30    Estimated Creatinine Clearance: 81.9 ml/min (by C-G formula based on Cr of 1.04).   Medications:  Scheduled:  . amiodarone  200 mg Oral Daily  . aspirin  324 mg Oral NOW   Or  . aspirin  300 mg Rectal NOW  . aspirin  81 mg Oral Pre-Cath  . aspirin  81 mg Oral Daily  . aspirin EC  81 mg Oral Daily  . atorvastatin  40 mg Oral Daily  . carvedilol  25 mg Oral BID WC  . clopidogrel  75 mg Oral Daily  . finasteride  5 mg Oral Daily  . furosemide  80 mg Oral BID  . isosorbide mononitrate  30 mg Oral QHS  . levothyroxine  50 mcg Oral QAC breakfast  . multivitamin-lutein  1 capsule Oral Daily  . ramipril  5 mg Oral Daily  . sodium chloride  3 mL Intravenous Q12H  . sodium chloride  3 mL Intravenous Q12H  . spironolactone  25 mg Oral Daily   Infusions:  . heparin 1,400 Units/hr (07/04/14 0441)    Assessment: 68 yo male with ACS is currently on therapeutic heparin. Heparin level is 0.38. Goal of Therapy:  Heparin level 0.3-0.7 units/ml Monitor  platelets by anticoagulation protocol: Yes   Plan:  1) Continue heparin at 1400 units/hr 2) Daily heparin level and CBC  Sharnise Blough, Tsz-Yin 07/04/2014,9:13 AM

## 2014-07-04 NOTE — Progress Notes (Signed)
ANTICOAGULATION CONSULT NOTE - Follow Up Consult  Pharmacy Consult for Heparin  Indication: chest pain/ACS  Allergies  Allergen Reactions  . Ace Inhibitors     REACTION: cough  . Penicillins Other (See Comments)    "childhood allergy"  . Tussionex Pennkinetic Er [Hydrocod Polst-Cpm Polst Er] Other (See Comments)    "caused prostate problems"    Patient Measurements: Height: 5\' 8"  (172.7 cm) Weight: 252 lb (114.306 kg) IBW/kg (Calculated) : 68.4  Vital Signs: Temp: 98.1 F (36.7 C) (07/24 2006) Temp src: Oral (07/24 2006) BP: 149/53 mmHg (07/24 2006) Pulse Rate: 56 (07/24 2006)  Labs:  Recent Labs  07/03/14 1531 07/03/14 1835 07/03/14 2135 07/04/14 0135  HGB 14.1  --   --  13.0  HCT 43.6  --   --  40.4  PLT 169  --   --  163  LABPROT  --  13.0  --  13.7  INR  --  0.98  --  1.05  HEPARINUNFRC  --   --   --  0.32  CREATININE 0.85  --   --  1.04  TROPONINI  --   --  <0.30 <0.30    Estimated Creatinine Clearance: 82.3 ml/min (by C-G formula based on Cr of 1.04).   Medications:  Heparin 1300 units/hr  Assessment: 68 y/o M on heparin for CP. HL of 0.32 drawn a little early so likely still seeing some bolus effect on heparin level, will increase slightly to prevent sub-therapeutic level. Other labs as above.   Goal of Therapy:  Heparin level 0.3-0.7 units/ml Monitor platelets by anticoagulation protocol: Yes   Plan:  -Increase heparin slightly to 1400 units/hr -1000 HL -Daily CBC/HL -Monitor for bleeding -Likely cath on monday  Narda Bonds 07/04/2014,2:42 AM

## 2014-07-04 NOTE — Progress Notes (Signed)
68 y.o. male with a history of HTN, HLD, extensive PAD (with bilateral iliac, CFA and renal stents - and numerous angioplasties), CAD s/p CABG x2 in 1996, obesity, tobacco abuse, carotid artery disease s/p CEA, AAA s/p recent stent grafting 07/3661, chronic systolic CHF (EF 94-76%) with recent admission in March and now followed by [facility] and OSA on CPAP who [facility] to [facility]/03/14 with chest pain. Negative MI     Subjective: No complaints,  No pain on heparin  Objective: Vital signs in last 24 hours: Temp:  [98.1 F (36.7 C)] 98.1 F (36.7 C) (07/25 0547) Pulse Rate:  [55-60] 58 (07/25 0547) Resp:  [16-23] 18 (07/25 0547) BP: (118-158)/(45-76) 118/53 mmHg (07/25 0547) SpO2:  [95 %-98 %] 97 % (07/25 0547) Weight:  [250 lb 3.6 oz (113.5 kg)-257 lb 8 oz (116.8 kg)] 250 lb 3.6 oz (113.5 kg) (07/25 0547) Weight change:    Intake/Output from previous day: -1510 07/24 0701 - 07/25 0700 In: -  Out: 1510 [Urine:1510] Intake/Output this shift: Total I/O In: 240 [P.O.:240] Out: -   PE: General:Pleasant affect, NAD Skin:Warm and dry, brisk capillary refill HEENT:normocephalic, sclera clear, mucus membranes moist Heart:S1S2 RRR without murmur, gallup, rub or click Lungs:clear, ant, without rales, rhonchi, or wheezes LYY:TKPTW, soft, non tender, + BS, do not palpate liver spleen or masses Ext:no lower ext edema, 2+ pedal pulses, 2+ radial pulses Neuro:alert and oriented X 3, MAE, follows commands, + facial symmetry   Lab Results:  Recent Labs  07/03/14 1531 07/04/14 0135  WBC 6.4 5.7  HGB 14.1 13.0  HCT 43.6 40.4  PLT 169 163   BMET  Recent Labs  07/03/14 1531 07/04/14 0135  NA 139 141  K 4.7 4.1  CL 97 100  CO2 28 29  GLUCOSE 106* 99  BUN 16 19  CREATININE 0.85 1.04  CALCIUM 9.2 9.1    Recent Labs  07/03/14 2135 07/04/14 0135  TROPONINI <0.30 <0.30    Lab Results  Component Value Date   CHOL 137 07/04/2014   HDL 44 07/04/2014   LDLCALC 68  07/04/2014   TRIG 127 07/04/2014   CHOLHDL 3.1 07/04/2014   Lab Results  Component Value Date   HGBA1C 5.9 06/18/2013     Lab Results  Component Value Date   TSH 6.640* 07/03/2014    Hepatic Function Panel  Recent Labs  07/04/14 0135  PROT 6.7  ALBUMIN 3.7  AST 22  ALT 23  ALKPHOS 69  BILITOT 0.5    Recent Labs  07/04/14 0135  CHOL 137   No results found for this basename: PROTIME,  in the last 72 hours     Studies/Results: Dg Chest 2 View  07/03/2014   CLINICAL DATA:  Episode of chest pain earlier today relieved with nitroglycerin. Prior MI and CABG. Indwelling pacing defibrillator.  EXAM: CHEST  2 VIEW  COMPARISON:  03/02/2014, 02/14/2014, 12/19/2013, 03/13/2013.  FINDINGS: Prior sternotomy for CABG. Cardiac silhouette moderately enlarged but stable. Left subclavian pacing defibrillator unchanged and appears intact. Thoracic aorta mildly atherosclerotic, unchanged. Hilar and mediastinal contours otherwise unremarkable. Chronic mild pulmonary venous hypertension without overt edema. Lungs clear. No pleural effusions. No pneumothorax. Mild degenerative changes involving the thoracic spine.  IMPRESSION: Stable cardiomegaly.  No acute cardiopulmonary disease.   Electronically Signed   By: Evangeline Dakin M.D.   On: 07/03/2014 16:19    Medications: I have reviewed the patient's current medications. Scheduled Meds: . amiodarone  200 mg  Oral Daily  . aspirin  324 mg Oral NOW   Or  . aspirin  300 mg Rectal NOW  . aspirin  81 mg Oral Pre-Cath  . aspirin  81 mg Oral Daily  . aspirin EC  81 mg Oral Daily  . atorvastatin  40 mg Oral Daily  . carvedilol  25 mg Oral BID WC  . clopidogrel  75 mg Oral Daily  . finasteride  5 mg Oral Daily  . furosemide  80 mg Oral BID  . isosorbide mononitrate  30 mg Oral QHS  . levothyroxine  50 mcg Oral QAC breakfast  . multivitamin-lutein  1 capsule Oral Daily  . ramipril  5 mg Oral Daily  . sodium chloride  3 mL Intravenous Q12H  . sodium  chloride  3 mL Intravenous Q12H  . spironolactone  25 mg Oral Daily   Continuous Infusions: . heparin 1,400 Units/hr (07/04/14 0441)   PRN Meds:.sodium chloride, sodium chloride, acetaminophen, albuterol, nitroGLYCERIN, ondansetron (ZOFRAN) IV, sodium chloride, sodium chloride  Assessment/Plan: Chest pain- very concerning for [place] in the setting of known CAD, CP reminiscent of previous [place] and increasing SL NTG use. He has new TWI in V6.  -- CABG x2 in 1996, cath in 2008, had occlusion in both vein grafts from his initial CABG, but no significant LAD or diagonal disease, and he has had circumflex collaterals from the left. ( see note above)  --Troponin x3 negative, ECG with no acute ST or TW changes  --telemtry  --Cycle Troponin and serial ECGs  -- Will placed on IV heparin and plan for cath on Monday with Dr. [place]  -- Continue ASA/Plavix, statin, BB and SL NTG. Will increase Imdur as BP has room  Chronic systolic CHF- CXR clear and BNP <500  -- Appear evolemic on exam. Continue Lasix 80mg  BID and aldactone 25mg   -- Continue Altace and Coreg  HTN- on imdur  Ischemic CM- s/p ICD - will check, rep on way -- ECHO 02/25/14 : EF 20-25% G2DD, severe global hypokinesis with inferior akinesis. Severe LA dilation, mild RA dilation, mild MR, PA pressure 10mmg Hg.  -- Has Chemical engineer device- will need interrogation tomorrow AM.  -- Continue amiodarone for prevention of ventricular arrhythmias  OSA- will order CPAP  HLD- continued statin Lipid Panel     Component Value Date/Time   CHOL 137 07/04/2014 0135   TRIG 127 07/04/2014 0135   HDL 44 07/04/2014 0135   CHOLHDL 3.1 07/04/2014 0135   VLDL 25 07/04/2014 0135   LDLCALC 68 07/04/2014 0135       LOS: 1 day   Time spent with pt. :15 minutes. [facility] R  Nurse Practitioner Certified Pager [phone] or after 5pm and on weekends call [phone] 07/04/2014, 10:19 AM   Patient seen and examined and history reviewed. Agree with above  findings and plan. No further angina. Cardiac Enzymes are all negative. No significant arrythmias. Ecg shows LBBB. Lateral T wave inversion resolved. Will interrogate ICD. Plan cardiac cath Monday. Continue IV heparin.  Lenna Hagarty [place], [place] 07/04/2014 11:18 AM

## 2014-07-05 LAB — CBC
HCT: 38.6 % — ABNORMAL LOW (ref 39.0–52.0)
Hemoglobin: 12.6 g/dL — ABNORMAL LOW (ref 13.0–17.0)
MCH: 30.7 pg (ref 26.0–34.0)
MCHC: 32.6 g/dL (ref 30.0–36.0)
MCV: 94.1 fL (ref 78.0–100.0)
Platelets: 155 10*3/uL (ref 150–400)
RBC: 4.1 MIL/uL — ABNORMAL LOW (ref 4.22–5.81)
RDW: 13.9 % (ref 11.5–15.5)
WBC: 5.4 10*3/uL (ref 4.0–10.5)

## 2014-07-05 LAB — HEPARIN LEVEL (UNFRACTIONATED): Heparin Unfractionated: 0.49 IU/mL (ref 0.30–0.70)

## 2014-07-05 MED ORDER — ACTIVE PARTNERSHIP FOR HEALTH OF YOUR HEART BOOK
Freq: Once | Status: AC
  Administered 2014-07-05: 06:00:00
  Filled 2014-07-05: qty 1

## 2014-07-05 NOTE — Progress Notes (Signed)
68 y.o. male with a history of HTN, HLD, extensive PAD (with bilateral iliac, CFA and renal stents - and numerous angioplasties), CAD s/p CABG x2 in 1996, obesity, tobacco abuse, carotid artery disease s/p CEA, AAA s/p recent stent grafting 05/2262, chronic systolic CHF (EF 33-54%) with recent admission in March and now followed by [facility] and OSA on CPAP who presented to [facility] 07/03/14 with chest pain. Negative MI     Subjective: Feels good today, would like heparin all the time  Objective: Vital signs in last 24 hours: Temp:  [97.5 F (36.4 C)-97.9 F (36.6 C)] 97.5 F (36.4 C) (07/26 0610) Pulse Rate:  [56-60] 56 (07/26 0610) Resp:  [16-20] 16 (07/26 0610) BP: (115-130)/(51-61) 115/51 mmHg (07/26 0610) SpO2:  [96 %-97 %] 97 % (07/26 0610) Weight:  [251 lb 8 oz (114.08 kg)] 251 lb 8 oz (114.08 kg) (07/26 0610) Weight change: -6 lb (-2.72 kg) Last BM Date: 07/04/14 Intake/Output from previous day: -1007 07/25 0701 - 07/26 0700 In: 943 [P.O.:940; I.V.:3] Out: 1950 [Urine:1950] Intake/Output this shift:    PE: General:Pleasant affect, NAD Skin:Warm and dry, brisk capillary refill HEENT:normocephalic, sclera clear, mucus membranes moist Heart:S1S2 RRR without murmur, gallup, rub or click Lungs:clear without rales, rhonchi, or wheezes TGY:BWLSL, soft, non tender, + BS, do not palpate liver spleen or masses Ext:no lower ext edema, 2+ radial pulses Neuro:alert and oriented, MAE, follows commands, + facial symmetry   Lab Results:  Recent Labs  07/04/14 0135 07/05/14 0316  WBC 5.7 5.4  HGB 13.0 12.6*  HCT 40.4 38.6*  PLT 163 155   BMET  Recent Labs  07/03/14 1531 07/04/14 0135  NA 139 141  K 4.7 4.1  CL 97 100  CO2 28 29  GLUCOSE 106* 99  BUN 16 19  CREATININE 0.85 1.04  CALCIUM 9.2 9.1    Recent Labs  07/04/14 0135 07/04/14 0930  TROPONINI <0.30 <0.30    Lab Results  Component Value Date   CHOL 137 07/04/2014   HDL 44 07/04/2014   LDLCALC 68  07/04/2014   TRIG 127 07/04/2014   CHOLHDL 3.1 07/04/2014   Lab Results  Component Value Date   HGBA1C 5.9 06/18/2013     Lab Results  Component Value Date   TSH 6.640* 07/03/2014    Hepatic Function Panel  Recent Labs  07/04/14 0135  PROT 6.7  ALBUMIN 3.7  AST 22  ALT 23  ALKPHOS 69  BILITOT 0.5    Recent Labs  07/04/14 0135  CHOL 137   No results found for this basename: PROTIME,  in the last 72 hours     Studies/Results: Dg Chest 2 View  07/03/2014   CLINICAL DATA:  Episode of chest pain earlier today relieved with nitroglycerin. Prior MI and CABG. Indwelling pacing defibrillator.  EXAM: CHEST  2 VIEW  COMPARISON:  03/02/2014, 02/14/2014, 12/19/2013, 03/13/2013.  FINDINGS: Prior sternotomy for CABG. Cardiac silhouette moderately enlarged but stable. Left subclavian pacing defibrillator unchanged and appears intact. Thoracic aorta mildly atherosclerotic, unchanged. Hilar and mediastinal contours otherwise unremarkable. Chronic mild pulmonary venous hypertension without overt edema. Lungs clear. No pleural effusions. No pneumothorax. Mild degenerative changes involving the thoracic spine.  IMPRESSION: Stable cardiomegaly.  No acute cardiopulmonary disease.   Electronically Signed   By: Evangeline Dakin M.D.   On: 07/03/2014 16:19    Medications: I have reviewed the patient's current medications. Scheduled Meds: . amiodarone  200 mg Oral Daily  . aspirin  81 mg Oral Daily  . aspirin EC  81 mg Oral Daily  . atorvastatin  40 mg Oral Daily  . carvedilol  25 mg Oral BID WC  . clopidogrel  75 mg Oral Daily  . finasteride  5 mg Oral Daily  . furosemide  80 mg Oral BID  . isosorbide mononitrate  30 mg Oral QHS  . levothyroxine  50 mcg Oral QAC breakfast  . multivitamin-lutein  1 capsule Oral Daily  . ramipril  5 mg Oral Daily  . sodium chloride  3 mL Intravenous Q12H  . sodium chloride  3 mL Intravenous Q12H  . spironolactone  25 mg Oral Daily   Continuous Infusions: .  heparin 1,400 Units/hr (07/04/14 0441)   PRN Meds:.sodium chloride, sodium chloride, acetaminophen, albuterol, nitroGLYCERIN, ondansetron (ZOFRAN) IV, sodium chloride, sodium chloride  Assessment/Plan: Chest pain- very concerning for [place] in the setting of known CAD, CP reminiscent of previous [place] and increasing SL NTG use. He has new TWI in V6.  -- CABG x2 in 1996, cath in 2008, had occlusion in both vein grafts from his initial CABG, but no significant LAD or diagonal disease, and he has had circumflex collaterals from the left. ( see note above)  --Troponin x3 negative, ECG with no acute ST or TW changes  --telemtry - SR  --Cycle Troponin and serial ECGs  -- Will placed on IV heparin and plan for cath on Monday with Dr. Martin[place]  -- Continue ASA/Plavix, statin, BB and SL NTG. Will increase Imdur as BP has room  Chronic systolic CHF- CXR clear and BNP <500  -- Appear evolemic on exam. Continue Lasix 80mg  BID and aldactone 25mg   -- Continue Altace and Coreg  HTN- on imdur  Ischemic CM- s/p ICD - will check, rep on way -- no changes no events since evaluation in June on interrogation -- ECHO 02/25/14 : EF 20-25% G2DD, severe global hypokinesis with inferior akinesis. Severe LA dilation, mild RA dilation, mild MR, PA pressure 27mmg Hg.  -- Has Chemical engineer device- will need interrogation tomorrow AM.  -- Continue amiodarone for prevention of ventricular arrhythmias  OSA- will order CPAP  HLD- continued statin  Lipid Panel    Component  Value  Date/Time    CHOL  137  07/04/2014 0135    TRIG  127  07/04/2014 0135    HDL  44  07/04/2014 0135    CHOLHDL  3.1  07/04/2014 0135    VLDL  25  07/04/2014 0135    LDLCALC  68  07/04/2014 0135        LOS: 2 days   Time spent with pt. :15 minutes. [facility]  Nurse Practitioner Certified Pager [phone] or after 5pm and on weekends call [phone] 07/05/2014, 8:01 AM   Patient seen and examined and history reviewed. Agree with above findings  and plan. No further chest pain. Exam is unchanged. Ecg is stable. Plan cardiac cath tomorrow.  Lekita Kerekes [place], [place] 07/05/2014 11:32 AM

## 2014-07-05 NOTE — Progress Notes (Signed)
ANTICOAGULATION CONSULT NOTE - Follow Up Consult  Pharmacy Consult for heparin Indication: chest pain/ACS  Allergies  Allergen Reactions  . Ace Inhibitors     REACTION: cough  . Penicillins Other (See Comments)    "childhood allergy"  . Tussionex Pennkinetic Er [Hydrocod Polst-Cpm Polst Er] Other (See Comments)    "caused prostate problems"    Patient Measurements: Height: 5\' 8"  (172.7 cm) Weight: 251 lb 8 oz (114.08 kg) IBW/kg (Calculated) : 68.4 Heparin Dosing Weight:   Vital Signs: Temp: 97.5 F (36.4 C) (07/26 0610) BP: 115/51 mmHg (07/26 0610) Pulse Rate: 56 (07/26 0610)  Labs:  Recent Labs  07/03/14 1531 07/03/14 1835 07/03/14 2135 07/04/14 0135 07/04/14 0930 07/05/14 0316  HGB 14.1  --   --  13.0  --  12.6*  HCT 43.6  --   --  40.4  --  38.6*  PLT 169  --   --  163  --  155  LABPROT  --  13.0  --  13.7  --   --   INR  --  0.98  --  1.05  --   --   HEPARINUNFRC  --   --   --  0.32 0.38 0.49  CREATININE 0.85  --   --  1.04  --   --   TROPONINI  --   --  <0.30 <0.30 <0.30  --     Estimated Creatinine Clearance: 82.2 ml/min (by C-G formula based on Cr of 1.04).   Medications:  Scheduled:  . amiodarone  200 mg Oral Daily  . aspirin  81 mg Oral Daily  . aspirin EC  81 mg Oral Daily  . atorvastatin  40 mg Oral Daily  . carvedilol  25 mg Oral BID WC  . clopidogrel  75 mg Oral Daily  . finasteride  5 mg Oral Daily  . furosemide  80 mg Oral BID  . isosorbide mononitrate  30 mg Oral QHS  . levothyroxine  50 mcg Oral QAC breakfast  . multivitamin-lutein  1 capsule Oral Daily  . ramipril  5 mg Oral Daily  . sodium chloride  3 mL Intravenous Q12H  . sodium chloride  3 mL Intravenous Q12H  . spironolactone  25 mg Oral Daily   Infusions:  . heparin 1,400 Units/hr (07/04/14 0441)    Assessment: 68 yo male with chest pain is currently on therapeutic heparin. Heparin level is 0.49. Goal of Therapy:  Heparin level 0.3-0.7 units/ml Monitor platelets by  anticoagulation protocol: Yes   Plan:  1) Continue heparin at 1400 units/hr 2) Daily heparin level and CBC 3) f/u after cath on Monday  Rayner Erman, [place] 07/05/2014,7:38 AM

## 2014-07-06 ENCOUNTER — Encounter (HOSPITAL_COMMUNITY)
Admission: EM | Disposition: A | Discharge status: Home / Self Care | Payer: Medicare Other | Source: Home / Self Care | Attending: Cardiology

## 2014-07-06 ENCOUNTER — Encounter: Payer: Self-pay | Admitting: Cardiovascular Disease

## 2014-07-06 DIAGNOSIS — I2510 Atherosclerotic heart disease of native coronary artery without angina pectoris: Secondary | ICD-10-CM

## 2014-07-06 HISTORY — PX: LEFT HEART CATHETERIZATION WITH CORONARY ANGIOGRAM: SHX5451

## 2014-07-06 LAB — BASIC METABOLIC PANEL
Anion gap: 13 (ref 5–15)
BUN: 24 mg/dL — ABNORMAL HIGH (ref 6–23)
CO2: 27 mEq/L (ref 19–32)
Calcium: 8.7 mg/dL (ref 8.4–10.5)
Chloride: 101 mEq/L (ref 96–112)
Creatinine, Ser: 0.98 mg/dL (ref 0.50–1.35)
GFR calc Af Amer: >90 mL/min (ref >90)
GFR calc non Af Amer: 82 mL/min — ABNORMAL LOW (ref >90)
Glucose, Bld: 93 mg/dL (ref 70–99)
Potassium: 3.9 mEq/L (ref 3.7–5.3)
Sodium: 141 mEq/L (ref 137–147)

## 2014-07-06 LAB — CBC
HCT: 37.9 % — ABNORMAL LOW (ref 39.0–52.0)
Hemoglobin: 12 g/dL — ABNORMAL LOW (ref 13.0–17.0)
MCH: 29.8 pg (ref 26.0–34.0)
MCHC: 31.7 g/dL (ref 30.0–36.0)
MCV: 94 fL (ref 78.0–100.0)
Platelets: 144 10*3/uL — ABNORMAL LOW (ref 150–400)
RBC: 4.03 MIL/uL — ABNORMAL LOW (ref 4.22–5.81)
RDW: 13.9 % (ref 11.5–15.5)
WBC: 5.9 10*3/uL (ref 4.0–10.5)

## 2014-07-06 LAB — PROTIME-INR
INR: 1.1 (ref 0.00–1.49)
Prothrombin Time: 14.2 seconds (ref 11.6–15.2)

## 2014-07-06 LAB — HEPARIN LEVEL (UNFRACTIONATED): Heparin Unfractionated: 0.65 IU/mL (ref 0.30–0.70)

## 2014-07-06 SURGERY — LEFT HEART CATHETERIZATION WITH CORONARY ANGIOGRAM
Anesthesia: LOCAL

## 2014-07-06 MED ORDER — ASPIRIN 81 MG PO CHEW
81.0000 mg | CHEWABLE_TABLET | ORAL | Status: AC
Start: 2014-07-06 — End: 2014-07-06
  Administered 2014-07-06: 81 mg via ORAL
  Filled 2014-07-06: qty 1

## 2014-07-06 MED ORDER — SODIUM CHLORIDE 0.9 % IV SOLN: 250.0000 mL | INTRAVENOUS | Status: DC | PRN

## 2014-07-06 MED ORDER — SODIUM CHLORIDE 0.9 % IV SOLN
1.0000 mL/kg/h | INTRAVENOUS | Status: DC
  Administered 2014-07-06: 1 mL/kg/h via INTRAVENOUS

## 2014-07-06 MED ORDER — SODIUM CHLORIDE 0.9 % IV SOLN: 1.0000 mL/kg/h | INTRAVENOUS | Status: AC

## 2014-07-06 MED ORDER — MIDAZOLAM HCL 2 MG/2ML IJ SOLN
INTRAMUSCULAR | Status: AC
Start: 2014-07-06 — End: 2014-07-06
  Filled 2014-07-06: qty 2

## 2014-07-06 MED ORDER — HEPARIN SODIUM (PORCINE) 1000 UNIT/ML IJ SOLN
INTRAMUSCULAR | Status: AC
  Filled 2014-07-06: qty 1

## 2014-07-06 MED ORDER — FENTANYL CITRATE 0.05 MG/ML IJ SOLN
INTRAMUSCULAR | Status: AC
  Filled 2014-07-06: qty 2

## 2014-07-06 MED ORDER — ISOSORBIDE MONONITRATE ER 30 MG PO TB24: 30.0000 mg | ORAL_TABLET | Freq: Every day | ORAL | Status: DC

## 2014-07-06 MED ORDER — VERAPAMIL HCL 2.5 MG/ML IV SOLN
INTRAVENOUS | Status: AC
  Filled 2014-07-06: qty 2

## 2014-07-06 MED ORDER — SODIUM CHLORIDE 0.9 % IJ SOLN: 3.0000 mL | Freq: Two times a day (BID) | INTRAMUSCULAR | Status: DC

## 2014-07-06 MED ORDER — SODIUM CHLORIDE 0.9 % IJ SOLN: 3.0000 mL | INTRAMUSCULAR | Status: DC | PRN

## 2014-07-06 MED ORDER — LIDOCAINE HCL (PF) 1 % IJ SOLN
INTRAMUSCULAR | Status: AC
  Filled 2014-07-06: qty 30

## 2014-07-06 MED ORDER — NITROGLYCERIN 1 MG/10 ML FOR IR/CATH LAB
INTRA_ARTERIAL | Status: AC
  Filled 2014-07-06: qty 10

## 2014-07-06 NOTE — H&P (View-Only) (Signed)
68 y.o. male with a history of HTN, HLD, extensive PAD (with bilateral iliac, CFA and renal stents - and numerous angioplasties), CAD s/p CABG x2 in 1996, obesity, tobacco abuse, carotid artery disease s/p CEA, AAA s/p recent stent grafting 05/6439, chronic systolic CHF (EF 34-74%) with recent admission in March and now followed by [facility] and OSA on CPAP who presented to [facility] 07/03/14 with chest pain. Negative MI     Subjective: Feels good today, would like heparin all the time  Objective: Vital signs in last 24 hours: Temp:  [97.5 F (36.4 C)-97.9 F (36.6 C)] 97.5 F (36.4 C) (07/26 0610) Pulse Rate:  [56-60] 56 (07/26 0610) Resp:  [16-20] 16 (07/26 0610) BP: (115-130)/(51-61) 115/51 mmHg (07/26 0610) SpO2:  [96 %-97 %] 97 % (07/26 0610) Weight:  [251 lb 8 oz (114.08 kg)] 251 lb 8 oz (114.08 kg) (07/26 0610) Weight change: -6 lb (-2.72 kg) Last BM Date: 07/04/14 Intake/Output from previous day: -1007 07/25 0701 - 07/26 0700 In: 943 [P.O.:940; I.V.:3] Out: 1950 [Urine:1950] Intake/Output this shift:    PE: General:Pleasant affect, NAD Skin:Warm and dry, brisk capillary refill HEENT:normocephalic, sclera clear, mucus membranes moist Heart:S1S2 RRR without murmur, gallup, rub or click Lungs:clear without rales, rhonchi, or wheezes QVZ:DGLOV, soft, non tender, + BS, do not palpate liver spleen or masses Ext:no lower ext edema, 2+ radial pulses Neuro:alert and oriented, MAE, follows commands, + facial symmetry   Lab Results:  Recent Labs  07/04/14 0135 07/05/14 0316  WBC 5.7 5.4  HGB 13.0 12.6*  HCT 40.4 38.6*  PLT 163 155   BMET  Recent Labs  07/03/14 1531 07/04/14 0135  NA 139 141  K 4.7 4.1  CL 97 100  CO2 28 29  GLUCOSE 106* 99  BUN 16 19  CREATININE 0.85 1.04  CALCIUM 9.2 9.1    Recent Labs  07/04/14 0135 07/04/14 0930  TROPONINI <0.30 <0.30    Lab Results  Component Value Date   CHOL 137 07/04/2014   HDL 44 07/04/2014   LDLCALC 68  07/04/2014   TRIG 127 07/04/2014   CHOLHDL 3.1 07/04/2014   Lab Results  Component Value Date   HGBA1C 5.9 06/18/2013     Lab Results  Component Value Date   TSH 6.640* 07/03/2014    Hepatic Function Panel  Recent Labs  07/04/14 0135  PROT 6.7  ALBUMIN 3.7  AST 22  ALT 23  ALKPHOS 69  BILITOT 0.5    Recent Labs  07/04/14 0135  CHOL 137   No results found for this basename: PROTIME,  in the last 72 hours     Studies/Results: Dg Chest 2 View  07/03/2014   CLINICAL DATA:  Episode of chest pain earlier today relieved with nitroglycerin. Prior MI and CABG. Indwelling pacing defibrillator.  EXAM: CHEST  2 VIEW  COMPARISON:  03/02/2014, 02/14/2014, 12/19/2013, 03/13/2013.  FINDINGS: Prior sternotomy for CABG. Cardiac silhouette moderately enlarged but stable. Left subclavian pacing defibrillator unchanged and appears intact. Thoracic aorta mildly atherosclerotic, unchanged. Hilar and mediastinal contours otherwise unremarkable. Chronic mild pulmonary venous hypertension without overt edema. Lungs clear. No pleural effusions. No pneumothorax. Mild degenerative changes involving the thoracic spine.  IMPRESSION: Stable cardiomegaly.  No acute cardiopulmonary disease.   Electronically Signed   By: Evangeline Dakin M.D.   On: 07/03/2014 16:19    Medications: I have reviewed the patient's current medications. Scheduled Meds: . amiodarone  200 mg Oral Daily  . aspirin  81 mg Oral Daily  . aspirin EC  81 mg Oral Daily  . atorvastatin  40 mg Oral Daily  . carvedilol  25 mg Oral BID WC  . clopidogrel  75 mg Oral Daily  . finasteride  5 mg Oral Daily  . furosemide  80 mg Oral BID  . isosorbide mononitrate  30 mg Oral QHS  . levothyroxine  50 mcg Oral QAC breakfast  . multivitamin-lutein  1 capsule Oral Daily  . ramipril  5 mg Oral Daily  . sodium chloride  3 mL Intravenous Q12H  . sodium chloride  3 mL Intravenous Q12H  . spironolactone  25 mg Oral Daily   Continuous Infusions: .  heparin 1,400 Units/hr (07/04/14 0441)   PRN Meds:.sodium chloride, sodium chloride, acetaminophen, albuterol, nitroGLYCERIN, ondansetron (ZOFRAN) IV, sodium chloride, sodium chloride  Assessment/Plan: Chest pain- very concerning for [place] in the setting of known CAD, CP reminiscent of previous [place] and increasing SL NTG use. He has new TWI in V6.  -- CABG x2 in 1996, cath in 2008, had occlusion in both vein grafts from his initial CABG, but no significant LAD or diagonal disease, and he has had circumflex collaterals from the left. ( see note above)  --Troponin x3 negative, ECG with no acute ST or TW changes  --telemtry - SR  --Cycle Troponin and serial ECGs  -- Will placed on IV heparin and plan for cath on Monday with Dr. Martin[place]  -- Continue ASA/Plavix, statin, BB and SL NTG. Will increase Imdur as BP has room  Chronic systolic CHF- CXR clear and BNP <500  -- Appear evolemic on exam. Continue Lasix 80mg  BID and aldactone 25mg   -- Continue Altace and Coreg  HTN- on imdur  Ischemic CM- s/p ICD - will check, rep on way -- no changes no events since evaluation in June on interrogation -- ECHO 02/25/14 : EF 20-25% G2DD, severe global hypokinesis with inferior akinesis. Severe LA dilation, mild RA dilation, mild MR, PA pressure 80mmg Hg.  -- Has Chemical engineer device- will need interrogation tomorrow AM.  -- Continue amiodarone for prevention of ventricular arrhythmias  OSA- will order CPAP  HLD- continued statin  Lipid Panel    Component  Value  Date/Time    CHOL  137  07/04/2014 0135    TRIG  127  07/04/2014 0135    HDL  44  07/04/2014 0135    CHOLHDL  3.1  07/04/2014 0135    VLDL  25  07/04/2014 0135    LDLCALC  68  07/04/2014 0135        LOS: 2 days   Time spent with pt. :15 minutes. [facility]  Nurse Practitioner Certified Pager [phone] or after 5pm and on weekends call [phone] 07/05/2014, 8:01 AM   Patient seen and examined and history reviewed. Agree with above findings  and plan. No further chest pain. Exam is unchanged. Ecg is stable. Plan cardiac cath tomorrow.  Peter [place], [place] 07/05/2014 11:32 AM

## 2014-07-06 NOTE — Discharge Instructions (Signed)
Radial Site Care °Refer to this sheet in the next few weeks. These instructions provide you with information on caring for yourself after your procedure. Your caregiver may also give you more specific instructions. Your treatment has been planned according to current medical practices, but problems sometimes occur. Call your caregiver if you have any problems or questions after your procedure. °HOME CARE INSTRUCTIONS °· You may shower the day after the procedure. Remove the bandage (dressing) and gently wash the site with plain soap and water. Gently pat the site dry. °· Do not apply powder or lotion to the site. °· Do not submerge the affected site in water for 3 to 5 days. °· Inspect the site at least twice daily. °· Do not flex or bend the affected arm for 24 hours. °· No lifting over 5 pounds (2.3 kg) for 5 days after your procedure. °· Do not drive home if you are discharged the same day of the procedure. Have someone else drive you. °· You may drive 24 hours after the procedure unless otherwise instructed by your caregiver. °· Do not operate machinery or power tools for 24 hours. °· A responsible adult should be with you for the first 24 hours after you arrive home. °What to expect: °· Any bruising will usually fade within 1 to 2 weeks. °· Blood that collects in the tissue (hematoma) may be painful to the touch. It should usually decrease in size and tenderness within 1 to 2 weeks. °SEEK IMMEDIATE MEDICAL CARE IF: °· You have unusual pain at the radial site. °· You have redness, warmth, swelling, or pain at the radial site. °· You have drainage (other than a small amount of blood on the dressing). °· You have chills. °· You have a fever or persistent symptoms for more than 72 hours. °· You have a fever and your symptoms suddenly get worse. °· Your arm becomes pale, cool, tingly, or numb. °· You have heavy bleeding from the site. Hold pressure on the site. °Document Released: 12/30/2010 Document Revised:  02/19/2012 Document Reviewed: 12/30/2010 °ExitCare® Patient Information ©2015 [employer]. This information is not intended to replace advice given to you by your health care provider. Make sure you discuss any questions you have with your health care provider. ° °

## 2014-07-06 NOTE — Interval H&P Note (Signed)
History and Physical Interval Note:  07/06/2014 7:41 AM  [name]  has presented today for surgery, with the diagnosis of chest pain  The various methods of treatment have been discussed with the patient and family. After consideration of risks, benefits and other options for treatment, the patient has consented to  Procedure(s): LEFT HEART CATHETERIZATION WITH CORONARY ANGIOGRAM ([name]) as a surgical intervention .  The patient's history has been reviewed, patient examined, no change in status, stable for surgery.  I have reviewed the patient's chart and labs.  Questions were answered to the patient's satisfaction.    Cath Lab Visit (complete for each Cath Lab visit)  Clinical Evaluation Leading to the Procedure:   ACS: Yes.    Non-ACS:    Anginal Classification: CCS IV  Anti-ischemic medical therapy: Maximal Therapy (2 or more classes of medications)  Non-Invasive Test Results: No non-invasive testing performed  Prior CABG: Previous CABG       [name] [facility] 07/06/2014 7:41 AM

## 2014-07-06 NOTE — CV Procedure (Signed)
    Cardiac Catheterization Procedure Note  Name: [name] MRN: [id number] DOB: 02-Jun-1946  Procedure: Left Heart Cath, Selective Coronary Angiography, LV angiography  Indication: 68 yo WM with history of CAD presents with unstable angina.   Procedural Details: The right wrist was prepped, draped, and anesthetized with 1% lidocaine. Using the modified Seldinger technique, a 6 French slender sheath was introduced into the right radial artery. 3 mg of verapamil was administered through the sheath, weight-based unfractionated heparin was administered intravenously. Standard Judkins catheters were used for selective coronary angiography and left ventriculography. Catheter exchanges were performed over an exchange length guidewire. There were no immediate procedural complications. A TR band was used for radial hemostasis at the completion of the procedure.  The patient was transferred to the post catheterization recovery area for further monitoring.  Procedural Findings: Hemodynamics: AO 132/54 mean 81 mm Hg LV 133/23 mm Hg  Coronary angiography: Coronary dominance: right  Left mainstem: Normal  Left anterior descending (LAD): The LAD is a large vessel. There is a 40% stenosis immediately after the takeoff of a large septal perforator. There are 2 large diagonal branches without significant disease.  Left circumflex (LCx): The LCx is occluded proximally. There are left to left collaterals.  Right coronary artery (RCA): The RCA is occluded proximally immediately following the conus branch. There are right to right and left to right collaterals.   The SVGs were not imaged since these were known to be occluded on multiple prior studies.   Left ventriculography: Left ventricular systolic function is abnormal, LV size is increased, LVEF is estimated at 30%, there is extensive inferior akinesis. There is moderate mitral regurgitation   Final Conclusions:   1. 2 vessel occlusive CAD.  Chronic occlusion of RCA and LCx with collaterals. 2. Severe LV dysfunction.  Recommendations: Compared to prior study there is no significant change. Recommend continued medical therapy with addition of long acting nitrate.  Ivelise Castillo [place], [place]  07/06/2014, 8:19 AM

## 2014-07-06 NOTE — Progress Notes (Signed)
TR Band deflated and removed per protocol. Tegaderm and gauze in place. No immediate complications. Site slightly bruised but no complaints per patient. VSS. Will continue to monitor closely.

## 2014-07-06 NOTE — Discharge Summary (Signed)
Patient seen and examined and history reviewed. Agree with above findings and plan. See cardiac cath findings and recommendations.  Zaion Hreha [place], [place] 07/06/2014 3:26 PM

## 2014-07-06 NOTE — Discharge Summary (Signed)
Physician Discharge Summary      Cardiologist:  Hilty Patient ID: [name] MRN: [id number] DOB/AGE: 69-Feb-1947 69 y.o.  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 male with a history of HTN, HLD, extensive PAD (with bilateral iliac, CFA and renal stents - and numerous angioplasties), CAD s/p CABG x2 in 1996, obesity, COPD with chronic dyspnea, tobacco abuse, carotid artery disease s/p CEA, AAA s/p recent stent grafting in 04/6386, chronic systolic CHF (EF 56-43%) with recent admission in march and now followed by [facility] and OSA on CPAP who presented to [facility] today with chest pain concerning for unstable angina.   From a cardiac standpoint he has had CABG x2 in 1996, cath in 2008, had occlusion in both vein grafts from his initial CABG, but no significant LAD or diagonal disease, and he has had circumflex collaterals from the left. He had 70% in-stent restenosis of prior left renal artery stent, but he has had good blood pressures. His remote CABG was in 1986 - 2 SVG's to the PDA and LCX, both of which are chronically occluded. At his last cath in 2008 he basically had 40% DX2, 30% LAD beyond DX2, 100% old circumflex and 100% right with left-to-left and left-to-right collaterals. Nuclear stress testing in 09/2012 was negative for ischemia, but showed a dense inferior scar and EF of 26%. EF by last echo in 1/14 was approximately 30-35% with no significant valve disease. He had ICD implanted in 2006 for purposes of primary prevention with ischemic cardiomyopathy by Dr. Rollene Fare, and he has had an EOL generator change in May of 2011. He has not had any ICD shocks recently since being started on low dose amiodarone for prevention of ventricular arrhythmias.  ECHO 02/25/14 : EF 20-25% G2DD, severe global hypokinesis with inferior akinesis. Severe LA dilation, mild RA dilation, mild MR, PA pressure 71mmg Hg. 09/2012 lexiscan myoview - mod-severe perfusion defect r/t infarct or scar w/mild periinfarct ishcemia in basal inferior, mid inferior, apical inferior, basal inferolateral & mid inferoalteral regions - EF 21% low risk scan   He has chronic angina responsive to SL NTG. Yesterday morning his heart felt "out of rhythm" and he felt hot and weak and also had a metallic taste in his mouth. He then had an episode of CP and diaphoresis this morning and afternoon. However, this afternoon it was burning and it scared him. He felt a wave of burning throughout his body. He had an intense metallic taste in his mouth, which was reminiscent of the chest pain that he had before his previous MIs. He has been taking increasing amounts of SL NTG and it is taking longer to work. The chest pain has felt different than his normal angina. He has not had worsening SOB and has chronic dyspnea from COPD.   The patient was admitted and ruled out for MI.  He was placed on IV heparin.  ASA/Plavix, statin, BB and SL NTG were continued.  Lasix 80mg  BID and aldactone 25mg  were continued for chronic CHF mgt.  EKG showed LBBB. Lateral T wave inversion resolved. PPM interrogation revealed no changes or events since evaluation in June.  He went for left heart cath which revealed  2 vessel occlusive CAD. Chronic occlusion of RCA and LCx with collaterals. Severe LV dysfunction.  There was no significant change when compared to prior study.  Imdur was increased to 30mg  daily.  The patient was seen by Dr. Martinique who felt he was stable for DC home.   Consults: None  Significant Diagnostic Studies:  Cardiac Catheterization Procedure Note  Name: [name]  MRN: [id number]  DOB: 1946/03/15  Procedure: Left Heart Cath, Selective Coronary Angiography, LV angiography  Indication: 68 yo WM with  history of CAD presents with unstable angina.  Procedural Details: The right wrist was prepped, draped, and anesthetized with 1% lidocaine. Using the modified Seldinger technique, a 6 French slender sheath was introduced into the right radial artery. 3 mg of verapamil was administered through the sheath, weight-based unfractionated heparin was administered intravenously. Standard Judkins catheters were used for selective coronary angiography and left ventriculography. Catheter exchanges were performed over an exchange length guidewire. There were no immediate procedural complications. A TR band was used for radial hemostasis at the completion of the procedure. The patient was transferred to the post catheterization recovery area for further monitoring.  Procedural Findings:  Hemodynamics:  AO 132/54 mean 81 mm Hg  LV 133/23 mm Hg  Coronary angiography:  Coronary dominance: right  Left mainstem: Normal  Left anterior descending (LAD): The LAD is a large vessel. There is a 40% stenosis immediately after the takeoff of a large septal perforator. There are 2 large diagonal branches without significant disease.  Left circumflex (LCx): The LCx is occluded proximally. There are left to left collaterals.  Right coronary artery (RCA): The RCA is occluded proximally immediately following the conus branch. There are right to right and left to right collaterals.  The SVGs were not imaged since these were known to be occluded on multiple prior studies.  Left ventriculography: Left ventricular systolic function is abnormal, LV size is increased, LVEF is estimated at 30%, there is extensive inferior akinesis. There is moderate mitral regurgitation  Final Conclusions:  1. 2 vessel occlusive CAD. Chronic occlusion of RCA and LCx with collaterals.  2. Severe LV dysfunction.  Recommendations: Compared to prior study there is no significant change. Recommend continued medical therapy with addition of long acting  nitrate.  Peter Martinique, [place]  07/06/2014, 8:19 AM  Treatments:   Discharge Exam: Blood pressure 135/79, pulse 68, temperature 98.3 F (36.8 C), temperature source Oral, resp. rate 16, height 5\' 8"  (1.727 m), weight 255 lb (115.667 kg), SpO2 98.00%.   Disposition: 01-Home or Self Care      Discharge Instructions   Diet - low sodium heart healthy    Complete by:  As directed      Discharge instructions    Complete by:  As directed   No lifting with your right arm for three days     Increase activity slowly    Complete by:  As directed             Medication List         albuterol 108 (90 BASE) MCG/ACT inhaler  Commonly known as:  PROVENTIL HFA;VENTOLIN HFA  Inhale 2 puffs into the lungs every 6 (six) hours as needed for wheezing or shortness of breath.     albuterol (2.5 MG/3ML) 0.083% nebulizer solution  Commonly known as:  PROVENTIL  Take 3 mLs (2.5 mg total) by nebulization every 6 (six) hours as needed for wheezing or shortness of breath.     aspirin 81 MG  tablet  Take 81 mg by mouth daily.     atorvastatin 40 MG tablet  Commonly known as:  LIPITOR  Take 1 tablet (40 mg total) by mouth daily.     carvedilol 25 MG tablet  Commonly known as:  COREG  Take 1 tablet (25 mg total) by mouth 2 (two) times daily with a meal.     clopidogrel 75 MG tablet  Commonly known as:  PLAVIX  Take 75 mg by mouth daily.     CORDARONE 200 MG tablet  Generic drug:  amiodarone  Take 200 mg by mouth daily.     finasteride 5 MG tablet  Commonly known as:  PROSCAR  Take 5 mg by mouth daily.     fish oil-omega-3 fatty acids 1000 MG capsule  Take 1 g by mouth daily.     furosemide 80 MG tablet  Commonly known as:  LASIX  Take 80 mg by mouth 2 (two) times daily.     isosorbide mononitrate 30 MG 24 hr tablet  Commonly known as:  IMDUR  Take 1 tablet (30 mg total) by mouth at bedtime.     levothyroxine 50 MCG tablet  Commonly known as:  SYNTHROID, LEVOTHROID  TAKE 1  TABLET (50 MCG TOTAL) BY MOUTH DAILY.     multivitamin-lutein Caps capsule  Take 1 capsule by mouth daily.     MENS 50+ MULTI VITAMIN/MIN Tabs  Take by mouth daily.     NITROSTAT 0.4 MG SL tablet  Generic drug:  nitroGLYCERIN  Dissolve 1 tablet under the tongue every 5 minutes as   needed for chest pain (up   to 3 tabs in 15 min then   call 911)     NON FORMULARY  - at bedtime. CPAP  - And during the day as needed     ramipril 5 MG capsule  Commonly known as:  ALTACE  Take 5 mg by mouth 4 (four) times daily.     spironolactone 25 MG tablet  Commonly known as:  ALDACTONE  Take 25 mg by mouth daily.       Follow-up Information   Follow up with Lyda Jester, PA-C On 07/21/2014. (at 8:30 am)    Specialty:  Cardiology   Contact information:   [address] [place] [address] [phone]      Greater than 30 minutes was spent completing the patient's discharge.   SignedTarri Fuller,  PAC 07/06/2014, 2:19 PM

## 2014-07-06 NOTE — Progress Notes (Signed)
UR Completed Derrick Tiegs Graves-Bigelow, RN,BSN [phone]  

## 2014-07-15 NOTE — Assessment & Plan Note (Signed)
Dyspnea on exertion reflects both COPD and cardiomyopathy.

## 2014-07-15 NOTE — Assessment & Plan Note (Signed)
Good compliance and control. Weight loss would help. Plan- DME to get new CPAP machine. Old one is worn out. Auto for intial

## 2014-07-15 NOTE — Assessment & Plan Note (Signed)
Counseled. Not considering bariatric surgery.

## 2014-07-21 ENCOUNTER — Encounter: Payer: Self-pay | Admitting: Cardiology

## 2014-07-21 ENCOUNTER — Ambulatory Visit (INDEPENDENT_AMBULATORY_CARE_PROVIDER_SITE_OTHER): Payer: Medicare Other | Admitting: Cardiology

## 2014-07-21 VITALS — BP 106/62 | HR 60 | Ht 68.0 in | Wt 260.1 lb

## 2014-07-21 DIAGNOSIS — I509 Heart failure, unspecified: Secondary | ICD-10-CM

## 2014-07-21 DIAGNOSIS — I10 Essential (primary) hypertension: Secondary | ICD-10-CM | POA: Diagnosis not present

## 2014-07-21 DIAGNOSIS — I5042 Chronic combined systolic (congestive) and diastolic (congestive) heart failure: Secondary | ICD-10-CM

## 2014-07-21 DIAGNOSIS — I2589 Other forms of chronic ischemic heart disease: Secondary | ICD-10-CM | POA: Diagnosis not present

## 2014-07-21 DIAGNOSIS — I2510 Atherosclerotic heart disease of native coronary artery without angina pectoris: Secondary | ICD-10-CM | POA: Diagnosis not present

## 2014-07-21 DIAGNOSIS — I255 Ischemic cardiomyopathy: Secondary | ICD-10-CM

## 2014-07-21 MED ORDER — RANOLAZINE ER 500 MG PO TB12: 500.0000 mg | ORAL_TABLET | Freq: Two times a day (BID) | ORAL | Status: DC

## 2014-07-21 NOTE — Progress Notes (Signed)
07/21/2014 [name]   01/19/1946  [id number]  Primary Physicia [name], MD Primary Cardiologist: [facility] & Dr. Sallyanne Kuster (follows ICD)  HPI:  [name] is a 68 y.o. male with a history of HTN, HLD, extensive PAD (with bilateral iliac, CFA and renal stents - and numerous angioplasties), CAD s/p CABG x2 in 1996, obesity, COPD with chronic dyspnea, tobacco abuse, carotid artery disease s/p CEA, AAA s/p recent stent grafting in 04/276, chronic systolic CHF (EF 41-28%), also has an ICD. He presents to clinic for post hospital followup after recent admission for evaluation of unstable angina. H was admitted and ruled out for MI. He was placed on IV heparin. ASA/Plavix, statin, BB and SL NTG were continued. Lasix 80mg  BID and aldactone 25mg  were continued for chronic CHF mgt. EKG showed LBBB. Lateral T wave inversion resolved. PPM interrogation revealed no changes or events since evaluation in June. He went for left heart cath which revealed 2 vessel occlusive CAD. Chronic occlusion of RCA and LCx with collaterals. Severe LV dysfunction. There was no significant change when compared to prior study. Imdur was increased to 30mg  daily. The patient was seen by Dr. Martinique who felt he was stable for DC home.  Since being discharged, he has continued to have recurrent anginal symptoms, noting bilateral arm discomfort/heaviness, typical of his prior angina. His pain is immediately relieved with SL NTG. He has had to use SL NTG almost daily since leaving the hospital. He denies dyspnea, dizziness, syncope/near-syncope. ICD shocks. States he is been fully compliant with his medications including the increased dose of Imdur.   Current Outpatient Prescriptions  Medication Sig Dispense Refill  . albuterol (PROVENTIL HFA;VENTOLIN HFA) 108 (90 BASE) MCG/ACT inhaler Inhale 2 puffs into the lungs every 6 (six) hours as needed for wheezing or shortness of breath.  1 Inhaler  3  . albuterol (PROVENTIL) (2.5  MG/3ML) 0.083% nebulizer solution Take 3 mLs (2.5 mg total) by nebulization every 6 (six) hours as needed for wheezing or shortness of breath.  75 mL  12  . amiodarone (CORDARONE) 200 MG tablet Take 200 mg by mouth daily.       Marland Kitchen aspirin 81 MG tablet Take 81 mg by mouth daily.      Marland Kitchen atorvastatin (LIPITOR) 40 MG tablet Take 1 tablet (40 mg total) by mouth daily.  90 tablet  3  . carvedilol (COREG) 25 MG tablet Take 1 tablet (25 mg total) by mouth 2 (two) times daily with a meal.  180 tablet  3  . clopidogrel (PLAVIX) 75 MG tablet Take 75 mg by mouth daily.        . finasteride (PROSCAR) 5 MG tablet Take 5 mg by mouth daily.      . fish oil-omega-3 fatty acids 1000 MG capsule Take 1 g by mouth daily.       . furosemide (LASIX) 80 MG tablet Take 80 mg by mouth 2 (two) times daily.      . isosorbide mononitrate (IMDUR) 30 MG 24 hr tablet Take 1 tablet (30 mg total) by mouth at bedtime.  30 tablet  5  . levothyroxine (SYNTHROID, LEVOTHROID) 50 MCG tablet TAKE 1 TABLET (50 MCG TOTAL) BY MOUTH DAILY.  90 tablet  1  . Multiple Vitamins-Minerals (MENS 50+ MULTI VITAMIN/MIN) TABS Take by mouth daily.      . multivitamin-lutein (OCUVITE-LUTEIN) CAPS Take 1 capsule by mouth daily.        Marland Kitchen NITROSTAT 0.4 MG SL  tablet Dissolve 1 tablet under the tongue every 5 minutes as   needed for chest pain (up   to 3 tabs in 15 min then   call 911)  100 tablet  0  . NON FORMULARY at bedtime. CPAP And during the day as needed      . nystatin-triamcinolone (MYCOLOG II) cream Apply 1 application topically daily.      . ramipril (ALTACE) 5 MG capsule Take 5 mg by mouth 2 (two) times daily.       Marland Kitchen spironolactone (ALDACTONE) 25 MG tablet Take 25 mg by mouth daily.       No current facility-administered medications for this visit.    Allergies  Allergen Reactions  . Ace Inhibitors     REACTION: cough  . Penicillins Other (See Comments)    "childhood allergy"  . Tussionex Pennkinetic Er [Hydrocod Polst-Cpm Polst Er]  Other (See Comments)    "caused prostate problems"    History   Social History  . Marital Status: Married    Spouse Name: [name]    Number of Children: 2  . Years of Education: [name]   Occupational History  . retired Solicitor     Social History Main Topics  . Smoking status: Former Smoker -- 1.50 packs/day for 40 years    Types: Cigarettes    Quit date: 12/11/2002  . Smokeless tobacco: Former Systems developer  . Alcohol Use: No  . Drug Use: No  . Sexual Activity: Not Currently   Other Topics Concern  . Not on file   Social History Narrative  . No narrative on file     Review of Systems: General: negative for chills, fever, night sweats or weight changes.  Cardiovascular: negative for chest pain, dyspnea on exertion, edema, orthopnea, palpitations, paroxysmal nocturnal dyspnea or shortness of breath Dermatological: negative for rash Respiratory: negative for cough or wheezing Urologic: negative for hematuria Abdominal: negative for nausea, vomiting, diarrhea, bright red blood per rectum, melena, or hematemesis Neurologic: negative for visual changes, syncope, or dizziness All other systems reviewed and are otherwise negative except as noted above.    Blood pressure 106/62, pulse 60, height 5\' 8"  (1.727 m), weight 260 lb 1.6 oz (117.981 kg).  General appearance: alert, cooperative and no distress Neck: no carotid bruit and no JVD Lungs: clear to auscultation bilaterally Heart: regular rate and rhythm, S1, S2 normal, no murmur, click, rub or gallop Extremities: no LEE Pulses: 2+ and symmetric Skin: warm and dry Neurologic: Grossly normal  EKG NSR LBBB (old) 60 bpm  ASSESSMENT AND PLAN:   1. Recurrent Angina: He is already on the maximum dose on Coreg, 25 mg BID and he is on 30 mg of Imdur. There is no room to further titrate Imdur or add amlodipine due to soft BP. BP today is 106/62. I have recommended that we try Ranexa, 500 mg BID. Will give samples to see if  this improves his symptoms. Patient has Medicare and may not be able to afford Ranexa long term. Will check with pharmacist regarding monthly cost and will see if he qualifies for a discount card.  2. Chronic systolic heart failure: Euvolemic on physical exam. Denies dyspnea, orthopnea, PND and lower extremity edema. Continue current medical regimen  PLAN  F/u with Dr. Sallyanne Kuster in September for ICD check and again with Dr. Debara Pickett in 6 months for routien cardiac f/u.   Makana Rostad, BRITTAINYPA-C 07/21/2014 8:53 AM

## 2014-07-21 NOTE — Patient Instructions (Signed)
Start Ranexa 500 mg twice a day   Follow up with Dr.Hilty in 6 mo 01/2014

## 2014-08-03 ENCOUNTER — Telehealth: Payer: Self-pay | Admitting: Oncology

## 2014-08-03 NOTE — Telephone Encounter (Signed)
Lft msg for pt on 08/14 and again on 08/24 advising pt of change of apt time from pm to am, mailed sch out on 08/14, cld just to confirm lft another msg.Marland Kitchen..KJ

## 2014-08-04 ENCOUNTER — Other Ambulatory Visit (HOSPITAL_BASED_OUTPATIENT_CLINIC_OR_DEPARTMENT_OTHER): Payer: Medicare Other

## 2014-08-04 ENCOUNTER — Encounter: Payer: Self-pay | Admitting: Oncology

## 2014-08-04 ENCOUNTER — Telehealth: Payer: Self-pay | Admitting: Oncology

## 2014-08-04 ENCOUNTER — Ambulatory Visit (HOSPITAL_BASED_OUTPATIENT_CLINIC_OR_DEPARTMENT_OTHER): Payer: Medicare Other | Admitting: Oncology

## 2014-08-04 VITALS — BP 124/75 | HR 62 | Temp 98.3°F | Resp 21 | Ht 68.0 in | Wt 269.4 lb

## 2014-08-04 DIAGNOSIS — D539 Nutritional anemia, unspecified: Secondary | ICD-10-CM | POA: Diagnosis not present

## 2014-08-04 DIAGNOSIS — I2510 Atherosclerotic heart disease of native coronary artery without angina pectoris: Secondary | ICD-10-CM

## 2014-08-04 DIAGNOSIS — R0602 Shortness of breath: Secondary | ICD-10-CM

## 2014-08-04 DIAGNOSIS — D509 Iron deficiency anemia, unspecified: Secondary | ICD-10-CM

## 2014-08-04 LAB — CBC WITH DIFFERENTIAL/PLATELET
BASO%: 0.7 % (ref 0.0–2.0)
Basophils Absolute: 0 10*3/uL (ref 0.0–0.1)
EOS%: 3.9 % (ref 0.0–7.0)
Eosinophils Absolute: 0.2 10*3/uL (ref 0.0–0.5)
HCT: 36.7 % — ABNORMAL LOW (ref 38.4–49.9)
HGB: 11.9 g/dL — ABNORMAL LOW (ref 13.0–17.1)
LYMPH%: 17.7 % (ref 14.0–49.0)
MCH: 29.3 pg (ref 27.2–33.4)
MCHC: 32.3 g/dL (ref 32.0–36.0)
MCV: 90.6 fL (ref 79.3–98.0)
MONO#: 0.6 10*3/uL (ref 0.1–0.9)
MONO%: 10.7 % (ref 0.0–14.0)
NEUT#: 3.8 10*3/uL (ref 1.5–6.5)
NEUT%: 67 % (ref 39.0–75.0)
Platelets: 151 10*3/uL (ref 140–400)
RBC: 4.05 10*6/uL — ABNORMAL LOW (ref 4.20–5.82)
RDW: 13.8 % (ref 11.0–14.6)
WBC: 5.6 10*3/uL (ref 4.0–10.3)
lymph#: 1 10*3/uL (ref 0.9–3.3)

## 2014-08-04 LAB — IRON AND TIBC CHCC
%SAT: 15 % — ABNORMAL LOW (ref 20–55)
Iron: 55 ug/dL (ref 42–163)
TIBC: 363 ug/dL (ref 202–409)
UIBC: 308 ug/dL (ref 117–376)

## 2014-08-04 LAB — FERRITIN CHCC: Ferritin: 13 ng/ml — ABNORMAL LOW (ref 22–316)

## 2014-08-04 NOTE — Progress Notes (Signed)
Hematology and Oncology Follow Up Visit  [name] [id number] 09/08/1946 68 y.o. 08/04/2014 9:33 AM   Principle Diagnosis: 68 year old with iron deficiency anemia diagnosed in 06/2012. This is likely due to GI  loss from AVMs  Prior Therapy: S/P Feraheme in 06/2012 with normalization of his Fe since  Interim History:  [name] presents today for a follow up visit with his wife. Since his last visit, he was hospitalized for congestive heart failure in July of 2015. He was hospitalized for a total of 3 times this year for the same issue. Since his discharge he felt better with improvement in his shortness of breath. He is no longer reporting any edema. He does have baseline shortness of breath but no active bleeding noted. He had not reported any progressive symptoms of weakness, fatigue, tiredness.  He does report some occasional hematochezia but no overt bleeding at this time. His energy level and activity is back to base line. He does not report any headaches or blurry does not report any seizure or syncope. He does not report any lymphadenopathy or petechiae. Does not report any genitourinary complaints. His review of systems unremarkable.    Medications: I have reviewed the patient's current medications.  Current Outpatient Prescriptions  Medication Sig Dispense Refill  . albuterol (PROVENTIL HFA;VENTOLIN HFA) 108 (90 BASE) MCG/ACT inhaler Inhale 2 puffs into the lungs every 6 (six) hours as needed for wheezing or shortness of breath.  1 Inhaler  3  . albuterol (PROVENTIL) (2.5 MG/3ML) 0.083% nebulizer solution Take 3 mLs (2.5 mg total) by nebulization every 6 (six) hours as needed for wheezing or shortness of breath.  75 mL  12  . amiodarone (CORDARONE) 200 MG tablet Take 200 mg by mouth daily.       Marland Kitchen aspirin 81 MG tablet Take 81 mg by mouth daily.      Marland Kitchen atorvastatin (LIPITOR) 40 MG tablet Take 1 tablet (40 mg total) by mouth daily.  90 tablet  3  . carvedilol (COREG) 25 MG tablet  Take 1 tablet (25 mg total) by mouth 2 (two) times daily with a meal.  180 tablet  3  . clopidogrel (PLAVIX) 75 MG tablet Take 75 mg by mouth daily.        . finasteride (PROSCAR) 5 MG tablet Take 5 mg by mouth daily.      . fish oil-omega-3 fatty acids 1000 MG capsule Take 1 g by mouth daily.       . furosemide (LASIX) 80 MG tablet Take 80 mg by mouth 2 (two) times daily.      . isosorbide mononitrate (IMDUR) 30 MG 24 hr tablet Take 1 tablet (30 mg total) by mouth at bedtime.  30 tablet  5  . levothyroxine (SYNTHROID, LEVOTHROID) 50 MCG tablet TAKE 1 TABLET (50 MCG TOTAL) BY MOUTH DAILY.  90 tablet  1  . Multiple Vitamins-Minerals (MENS 50+ MULTI VITAMIN/MIN) TABS Take by mouth daily.      . multivitamin-lutein (OCUVITE-LUTEIN) CAPS Take 1 capsule by mouth daily.        Marland Kitchen NITROSTAT 0.4 MG SL tablet Dissolve 1 tablet under the tongue every 5 minutes as   needed for chest pain (up   to 3 tabs in 15 min then   call 911)  100 tablet  0  . NON FORMULARY at bedtime. CPAP And during the day as needed      . nystatin-triamcinolone (MYCOLOG II) cream Apply 1 application topically daily.      Marland Kitchen  ramipril (ALTACE) 5 MG capsule Take 5 mg by mouth 2 (two) times daily.       . ranolazine (RANEXA) 500 MG 12 hr tablet Take 1 tablet (500 mg total) by mouth 2 (two) times daily.  60 tablet  6  . spironolactone (ALDACTONE) 25 MG tablet Take 25 mg by mouth daily.       No current facility-administered medications for this visit.     Allergies:  Allergies  Allergen Reactions  . Ace Inhibitors     REACTION: cough  . Penicillins Other (See Comments)    "childhood allergy"  . Tussionex Pennkinetic Er [Hydrocod Polst-Cpm Polst Er] Other (See Comments)    "caused prostate problems"    Past Medical History, Surgical history, Social history, and Family History were reviewed and updated.     Physical Exam: Blood pressure 124/75, pulse 62, temperature 98.3 F (36.8 C), temperature source Oral, resp. rate 21,  height 5\' 8"  (1.727 m), weight 269 lb 6.4 oz (122.199 kg), SpO2 96.00%. ECOG: 1 General appearance: alert awake gentleman not in any distress. Head: Normocephalic, without obvious abnormality, atraumatic Neck: no JVD Lymph nodes: Cervical, supraclavicular, and axillary nodes normal. Heart:regular rate and rhythm, S1, S2 normal, no murmur, click, rub or gallop Lung:chest clear, no wheezing, rales, normal symmetric air entry Abdomen: soft, non-tender, without masses or organomegaly EXT:no erythema, induration, or nodules   Lab Results: Lab Results  Component Value Date   WBC 5.6 08/04/2014   HGB 11.9* 08/04/2014   HCT 36.7* 08/04/2014   MCV 90.6 08/04/2014   PLT 151 08/04/2014     Chemistry      Component Value Date/Time   NA 141 07/06/2014 0406   NA 139 10/03/2013 1036   K 3.9 07/06/2014 0406   K 5.1 10/03/2013 1036   CL 101 07/06/2014 0406   CL 94* 10/02/2012 0937   CO2 27 07/06/2014 0406   CO2 28 10/03/2013 1036   BUN 24* 07/06/2014 0406   BUN 22.4 10/03/2013 1036   CREATININE 0.98 07/06/2014 0406   CREATININE 0.75 10/31/2013 1216   CREATININE 1.0 10/03/2013 1036      Component Value Date/Time   CALCIUM 8.7 07/06/2014 0406   CALCIUM 9.9 10/03/2013 1036   ALKPHOS 69 07/04/2014 0135   ALKPHOS 83 10/03/2013 1036   AST 22 07/04/2014 0135   AST 15 10/03/2013 1036   ALT 23 07/04/2014 0135   ALT 18 10/03/2013 1036   BILITOT 0.5 07/04/2014 0135   BILITOT 0.99 10/03/2013 1036       Impression and Plan:  A 68 year old gentleman with the following issues:  1. Microcytic hypochromic anemia with a documented iron deficiency anemia  in May of 2013. This has resolved after IV iron and continues to have stable Hgb. His hemoglobin today is close to his baseline and his iron studies are pending. If his iron levels start to decline, we will treat him with iron which I felt his symptoms in the past and what to do again. Currently, he is asymptomatic and we'll continue to follow his counts.  2.  GI blood loss: he is following with GI for that. It seems to have resolved now.   3. Follow up: in 6 months.    KPTWSF,KCLEX 8/25/20159:33 AM

## 2014-08-04 NOTE — Telephone Encounter (Signed)
gv adn printed appt sched and avs for pt for Feb 2016

## 2014-08-07 ENCOUNTER — Other Ambulatory Visit: Payer: Self-pay

## 2014-08-07 MED ORDER — CLOPIDOGREL BISULFATE 75 MG PO TABS: 75.0000 mg | ORAL_TABLET | Freq: Every day | ORAL | Status: DC

## 2014-08-07 NOTE — Telephone Encounter (Signed)
Rx was sent to pharmacy electronically. 

## 2014-08-11 ENCOUNTER — Other Ambulatory Visit: Payer: Self-pay

## 2014-08-11 MED ORDER — FUROSEMIDE 80 MG PO TABS: 80.0000 mg | ORAL_TABLET | Freq: Two times a day (BID) | ORAL | Status: DC

## 2014-08-11 NOTE — Telephone Encounter (Signed)
Rx was sent to pharmacy electronically. 

## 2014-08-13 ENCOUNTER — Encounter (HOSPITAL_COMMUNITY): Payer: Self-pay | Admitting: Vascular Surgery

## 2014-08-20 ENCOUNTER — Encounter: Payer: Self-pay | Admitting: Vascular Surgery

## 2014-08-21 ENCOUNTER — Telehealth (HOSPITAL_COMMUNITY): Payer: Self-pay | Admitting: Vascular Surgery

## 2014-08-21 ENCOUNTER — Ambulatory Visit (HOSPITAL_COMMUNITY)
Admission: RE | Admit: 2014-08-21 | Discharge: 2014-08-21 | Disposition: A | Discharge status: Home / Self Care | Payer: Medicare Other | Source: Ambulatory Visit | Attending: Vascular Surgery | Admitting: Vascular Surgery

## 2014-08-21 ENCOUNTER — Encounter: Payer: Self-pay | Admitting: Vascular Surgery

## 2014-08-21 ENCOUNTER — Ambulatory Visit (INDEPENDENT_AMBULATORY_CARE_PROVIDER_SITE_OTHER): Payer: Medicare Other | Admitting: Vascular Surgery

## 2014-08-21 VITALS — BP 87/42 | HR 56 | Ht 68.0 in | Wt 258.6 lb

## 2014-08-21 DIAGNOSIS — I714 Abdominal aortic aneurysm, without rupture, unspecified: Secondary | ICD-10-CM | POA: Diagnosis not present

## 2014-08-21 DIAGNOSIS — Z48812 Encounter for surgical aftercare following surgery on the circulatory system: Secondary | ICD-10-CM | POA: Diagnosis not present

## 2014-08-21 DIAGNOSIS — I2510 Atherosclerotic heart disease of native coronary artery without angina pectoris: Secondary | ICD-10-CM | POA: Diagnosis not present

## 2014-08-21 NOTE — Progress Notes (Signed)
    Established EVAR  History of Present Illness  [name] is a 68 y.o. (1946/10/19) male who presents for routine follow up s/p EVAR (Date: 01/06/14).  Most recent EVAR duplex (Date: 08/21/2014 ) demonstrates: no endoleak and decreasing sac size.  The patient has had back or abdominal pain.  He is complain of fatigue and leg cramps at night time  The patient's PMH, PSH, SH, FamHx, Med, and Allergies are unchanged from 02/13/14.  On ROS today: +dyspnea, no peripheral edema, no intermittent claudication  Physical Examination  Filed Vitals:   08/21/14 0952  BP: 87/42  Pulse: 56  Height: 5\' 8"  (1.727 m)  Weight: 258 lb 9.6 oz (117.3 kg)  SpO2: 98%   Body mass index is 39.33 kg/(m^2).  General: A&O x 3, WD, morbidly Obese,   Pulmonary: somewhat dyspneic, distal BS, no rales, rhonchi, & wheezing  Cardiac: RRR, Nl S1, S2, faint murmur, no rubs or gallops  Vascular: Vessel Right Left  Radial Palpable Palpable  Brachial Palpable Palpable  Carotid Palpable, without bruit Palpable, without bruit  Aorta Not palpable [name]  Femoral Palpable Palpable  Popliteal Not palpable Not palpable  PT Faintly Palpable Faintly Palpable  DP Faintly Palpable Faintly Palpable   Gastrointestinal: soft, NTND, -G/R, - HSM, - masses, - CVAT B  Musculoskeletal: M/S 5/5 throughout , Extremities without ischemic changes , minimal peripheral edema  Neurologic: Pain and light touch intact in extremities , Motor exam as listed above  Non-Invasive Vascular Imaging  EVAR Duplex (Date: 08/21/2014 )  AAA sac size: 5.2 cm x 5.0 cm  No endoleak detected  Limited study due to bowel gas   Medical Decision Making  [name] is a 68 y.o. male who presents s/p EVAR.  Pt is asymptomatic with decreasng sac size.  I discussed with the patient the importance of surveillance of the endograft.  The next endograft duplex will be scheduled for 12 months.  The next CTA will be scheduled for 6  months.  The patient will follow up with [place] in 6 months with these studies.  I recommended the patient contact his Heart Failure cardiologist today due to the hypotension on today's BP.  I suspect possible overdiuersis as I don't heard any rales on exam consistent with pulmonary edema associated with increasing CHF and no peripheral edema.  Thank you for allowing [place] to participate in this patient's care.  Adele Barthel, MD [facility] Office: [phone] Pager: [phone]  08/21/2014, 10:09 AM

## 2014-08-21 NOTE — Addendum Note (Signed)
Addended by: Mena Goes on: 08/21/2014 04:29 PM   Modules accepted: Orders

## 2014-08-21 NOTE — Telephone Encounter (Signed)
pt wife called pt bp 87/42, hr 54 he has been sleepy and weak wife believes he needs to be seen.. Please advise

## 2014-08-21 NOTE — Telephone Encounter (Signed)
Spoke w/pt he states he has been feeling much more fatigued over past month and has no energy at all, he does not weigh himself daily but states he has no edema, wt today at Dr Lianne Moris office was 258 lb which he states is stable for him BP was 87/42, he does not check BP at home, he is taking lasix 80 mg Twice daily advised pt can hold evening dose for now and f/u appt sch for Tue 9/15 at 10:15, pt verbalized understanding

## 2014-08-24 NOTE — Progress Notes (Addendum)
Patient ID: [name], male   DOB: 01/29/1946, 68 y.o.   MRN: [id number]  [facility]  08/25/2014 TRE SANKER   [birth year]  [id number]  Primary Physicia [name], MD Primary Cardiologist: Dr. Debara Pickett Hematologist: Dr Alen Blew  Pulmonary: Dr Annamaria Boots  HPI:  [name] is a pleasant 68 y.o. male, followed by Dr. Debara Pickett, with a past medical history significant for morbid obesity, systolic heart failure with an EF of 30-35% (January 2014) -> 20-25% (March 2015 - RV normal), AICD placement, VT on amio, CAD s/p CABG 1996,LBBB, COPD, obstructive sleep apnea on CPAP, AAA repair (January 2015) who was admitted to [facility] on 02/24/14 for acute on chronic HF and acute COPD exacerbation. He also had iron deficiency anemia.   Admitted to [facility] July 24 through July 27 with chest pain.  CEs negative. Had a LHC with no change from previous LHC with recommendations to continue medical management. Discharge weight was 255 pounds.   He returns for follow up. Last week he had low blood pressure 87/42 and was instructed to decrease lasix to 80 mg daily. He was previously on lasix 80 mg twice a day. Complains of fatigue. SOB with exertion. SOB with ADLs. Productive cough clear sputum. Denies fever. Denies dizziness/chest pain. . Sleeping in an upright position. Weight at home 260-263 pounds. Limited activity. Using CPAP nightly. Following low salt diet and limiting salt intake to < 2 liters per day.   LHC 07/06/14 --No significant change from previous studies.  Left anterior descending (LAD): The LAD is a large vessel. There is a 40% stenosis immediately after the takeoff of a large septal perforator. There are 2 large diagonal branches without significant disease.  Left circumflex (LCx): The LCx is occluded proximally. There are left to left collaterals.  Right coronary artery (RCA): The RCA is occluded proximally immediately following the conus branch. There are right to right and left to right  collaterals.   Labs 3/15 Cr 1.5 K 5.6 Labs 07/06/14 K 3.9 Creatinine  0.98   Current Outpatient Prescriptions  Medication Sig Dispense Refill  . albuterol (PROVENTIL HFA;VENTOLIN HFA) 108 (90 BASE) MCG/ACT inhaler Inhale 2 puffs into the lungs every 6 (six) hours as needed for wheezing or shortness of breath.  1 Inhaler  3  . albuterol (PROVENTIL) (2.5 MG/3ML) 0.083% nebulizer solution Take 3 mLs (2.5 mg total) by nebulization every 6 (six) hours as needed for wheezing or shortness of breath.  75 mL  12  . amiodarone (CORDARONE) 200 MG tablet Take 200 mg by mouth daily.       Marland Kitchen aspirin 81 MG tablet Take 81 mg by mouth daily.      Marland Kitchen atorvastatin (LIPITOR) 40 MG tablet Take 1 tablet (40 mg total) by mouth daily.  90 tablet  3  . carvedilol (COREG) 25 MG tablet Take 1 tablet (25 mg total) by mouth 2 (two) times daily with a meal.  180 tablet  3  . clopidogrel (PLAVIX) 75 MG tablet Take 1 tablet (75 mg total) by mouth daily.  90 tablet  3  . finasteride (PROSCAR) 5 MG tablet Take 5 mg by mouth daily.      . fish oil-omega-3 fatty acids 1000 MG capsule Take 1 g by mouth daily.       . furosemide (LASIX) 80 MG tablet Take 1 tablet (80 mg total) by mouth 2 (two) times daily.  180 tablet  3  . isosorbide mononitrate (IMDUR) 30 MG 24 hr  tablet Take 1 tablet (30 mg total) by mouth at bedtime.  30 tablet  5  . levothyroxine (SYNTHROID, LEVOTHROID) 50 MCG tablet TAKE 1 TABLET (50 MCG TOTAL) BY MOUTH DAILY.  90 tablet  1  . Multiple Vitamins-Minerals (MENS 50+ MULTI VITAMIN/MIN) TABS Take by mouth daily.      . multivitamin-lutein (OCUVITE-LUTEIN) CAPS Take 1 capsule by mouth daily.        Marland Kitchen NITROSTAT 0.4 MG SL tablet Dissolve 1 tablet under the tongue every 5 minutes as   needed for chest pain (up   to 3 tabs in 15 min then   call 911)  100 tablet  0  . NON FORMULARY at bedtime. CPAP And during the day as needed      . nystatin-triamcinolone (MYCOLOG II) cream Apply 1 application topically daily.      .  ramipril (ALTACE) 5 MG capsule Take 5 mg by mouth 2 (two) times daily.       . ranolazine (RANEXA) 500 MG 12 hr tablet Take 1 tablet (500 mg total) by mouth 2 (two) times daily.  60 tablet  6  . spironolactone (ALDACTONE) 25 MG tablet Take 25 mg by mouth daily.      . salmeterol (SEREVENT) 50 MCG/DOSE diskus inhaler Inhale 1 puff into the lungs 2 (two) times daily.  1 Inhaler  12   No current facility-administered medications for this encounter.    Allergies  Allergen Reactions  . Ace Inhibitors     REACTION: cough  . Penicillins Other (See Comments)    "childhood allergy"  . Tussionex Pennkinetic Er [Hydrocod Polst-Cpm Polst Er] Other (See Comments)    "caused prostate problems"    History   Social History  . Marital Status: Married    Spouse Name: [name]    Number of Children: 2  . Years of Education: [name]   Occupational History  . retired Solicitor     Social History Main Topics  . Smoking status: Former Smoker -- 1.50 packs/day for 40 years    Types: Cigarettes    Quit date: 12/11/2002  . Smokeless tobacco: Former Systems developer  . Alcohol Use: No  . Drug Use: No  . Sexual Activity: Not Currently   Other Topics Concern  . Not on file   Social History Narrative  . No narrative on file      Orthostatic BP  Sitting 90/60 Standing 90/60  Blood pressure 113/63, pulse 60, resp. rate 20, weight 263 lb 8 oz (119.523 kg), SpO2 98.00%.  General appearance: alert, cooperative and no distress Wife present.  Neck: no carotid bruit and no JVD. L CEA scar Lungs: Ex Wheezes through out.   Heart: regular rate and rhythm, S1, S2 normal, 2/6 SEM at RUSB  click, rub or gallop Extremities: no low extremity edema.  Skin: warm and dry Neurologic: Grossly normal   ASSESSMENT/Plan  1. Chronic systolic HF ECHO 03/2705 EF 20-25% ICM Has ICD. NYHA IIIb. Likely combination of HF and COPD. Wheezing through out so dyspnea seems more likely to be form COPD.  Volume status ok. Cut back  lasix to 80 mg daily and continue 25 mg spironolactone daily. Continue carvedilol 25 mg twice a day  Switch ramipril to losartan 25 mg at bed time due to cough.   Reinforced daily weights, low salt diet and limiting fluid intake to < 2 liters per day.  Check BMET and Pro BNP now.  2. CAD s/p CABG- no evidence of  ischemia . Continue 81 mg aspirin daily and atorvastatin daily  3. VT s/p ICD - on amio 200 mg daily  4. Morbid obesity- Needs to lose weight. Suggested [employer].  6. Severe COPD- Dyspnea with exertion but O2 sats remained > 88%. Continue albuterol. Add long acting bronchodilator. Start Serevent 1 puff twice a day.  7. LBBB QRS 170ms  Follow up in 1 month with Dr Donnamae Jude NP-C  12:41 PM

## 2014-08-25 ENCOUNTER — Encounter (HOSPITAL_COMMUNITY): Payer: Self-pay

## 2014-08-25 ENCOUNTER — Telehealth (HOSPITAL_COMMUNITY): Payer: Self-pay

## 2014-08-25 ENCOUNTER — Ambulatory Visit (HOSPITAL_COMMUNITY)
Admission: RE | Admit: 2014-08-25 | Discharge: 2014-08-25 | Disposition: A | Discharge status: Home / Self Care | Payer: Medicare Other | Source: Ambulatory Visit | Attending: Cardiology | Admitting: Cardiology

## 2014-08-25 VITALS — BP 113/63 | HR 60 | Resp 20 | Wt 263.5 lb

## 2014-08-25 DIAGNOSIS — G4733 Obstructive sleep apnea (adult) (pediatric): Secondary | ICD-10-CM

## 2014-08-25 DIAGNOSIS — I472 Ventricular tachycardia, unspecified: Secondary | ICD-10-CM | POA: Insufficient documentation

## 2014-08-25 DIAGNOSIS — Z7902 Long term (current) use of antithrombotics/antiplatelets: Secondary | ICD-10-CM | POA: Diagnosis not present

## 2014-08-25 DIAGNOSIS — I2589 Other forms of chronic ischemic heart disease: Secondary | ICD-10-CM | POA: Diagnosis not present

## 2014-08-25 DIAGNOSIS — I509 Heart failure, unspecified: Secondary | ICD-10-CM | POA: Diagnosis not present

## 2014-08-25 DIAGNOSIS — J449 Chronic obstructive pulmonary disease, unspecified: Secondary | ICD-10-CM | POA: Insufficient documentation

## 2014-08-25 DIAGNOSIS — J4489 Other specified chronic obstructive pulmonary disease: Secondary | ICD-10-CM | POA: Diagnosis not present

## 2014-08-25 DIAGNOSIS — Z9889 Other specified postprocedural states: Secondary | ICD-10-CM | POA: Insufficient documentation

## 2014-08-25 DIAGNOSIS — I5042 Chronic combined systolic (congestive) and diastolic (congestive) heart failure: Secondary | ICD-10-CM | POA: Diagnosis not present

## 2014-08-25 DIAGNOSIS — Z880 Allergy status to penicillin: Secondary | ICD-10-CM | POA: Diagnosis not present

## 2014-08-25 DIAGNOSIS — Z951 Presence of aortocoronary bypass graft: Secondary | ICD-10-CM | POA: Insufficient documentation

## 2014-08-25 DIAGNOSIS — Z713 Dietary counseling and surveillance: Secondary | ICD-10-CM | POA: Diagnosis not present

## 2014-08-25 DIAGNOSIS — I4729 Other ventricular tachycardia: Secondary | ICD-10-CM | POA: Diagnosis not present

## 2014-08-25 DIAGNOSIS — I5023 Acute on chronic systolic (congestive) heart failure: Secondary | ICD-10-CM | POA: Diagnosis not present

## 2014-08-25 DIAGNOSIS — Z888 Allergy status to other drugs, medicaments and biological substances status: Secondary | ICD-10-CM | POA: Insufficient documentation

## 2014-08-25 DIAGNOSIS — Z79899 Other long term (current) drug therapy: Secondary | ICD-10-CM | POA: Diagnosis not present

## 2014-08-25 DIAGNOSIS — Z95810 Presence of automatic (implantable) cardiac defibrillator: Secondary | ICD-10-CM | POA: Insufficient documentation

## 2014-08-25 DIAGNOSIS — R5383 Other fatigue: Secondary | ICD-10-CM | POA: Diagnosis present

## 2014-08-25 DIAGNOSIS — I447 Left bundle-branch block, unspecified: Secondary | ICD-10-CM | POA: Diagnosis not present

## 2014-08-25 DIAGNOSIS — Z9989 Dependence on other enabling machines and devices: Secondary | ICD-10-CM

## 2014-08-25 DIAGNOSIS — R5381 Other malaise: Secondary | ICD-10-CM | POA: Diagnosis present

## 2014-08-25 DIAGNOSIS — I2510 Atherosclerotic heart disease of native coronary artery without angina pectoris: Secondary | ICD-10-CM | POA: Insufficient documentation

## 2014-08-25 DIAGNOSIS — R0602 Shortness of breath: Secondary | ICD-10-CM | POA: Diagnosis present

## 2014-08-25 DIAGNOSIS — Z87891 Personal history of nicotine dependence: Secondary | ICD-10-CM | POA: Diagnosis not present

## 2014-08-25 DIAGNOSIS — Z7982 Long term (current) use of aspirin: Secondary | ICD-10-CM | POA: Insufficient documentation

## 2014-08-25 DIAGNOSIS — I255 Ischemic cardiomyopathy: Secondary | ICD-10-CM

## 2014-08-25 LAB — BASIC METABOLIC PANEL
Anion gap: 12 (ref 5–15)
BUN: 19 mg/dL (ref 6–23)
CO2: 20 mEq/L (ref 19–32)
Calcium: 8.8 mg/dL (ref 8.4–10.5)
Chloride: 104 mEq/L (ref 96–112)
Creatinine, Ser: 0.87 mg/dL (ref 0.50–1.35)
GFR calc Af Amer: >90 mL/min (ref >90)
GFR calc non Af Amer: 86 mL/min — ABNORMAL LOW (ref >90)
Glucose, Bld: 100 mg/dL — ABNORMAL HIGH (ref 70–99)
Potassium: 6.2 mEq/L — ABNORMAL HIGH (ref 3.7–5.3)
Sodium: 136 mEq/L — ABNORMAL LOW (ref 137–147)

## 2014-08-25 LAB — PRO B NATRIURETIC PEPTIDE: Pro B Natriuretic peptide (BNP): 524.2 pg/mL — ABNORMAL HIGH (ref 0–125)

## 2014-08-25 MED ORDER — LOSARTAN POTASSIUM 25 MG PO TABS
25.0000 mg | ORAL_TABLET | Freq: Every day | ORAL | Status: DC
Start: 2014-08-25 — End: 2014-08-26

## 2014-08-25 MED ORDER — SALMETEROL XINAFOATE 50 MCG/DOSE IN AEPB: 1.0000 | INHALATION_SPRAY | Freq: Two times a day (BID) | RESPIRATORY_TRACT | Status: DC

## 2014-08-25 NOTE — Telephone Encounter (Signed)
Labr esults reviewed with patient.  Critical potassium of 6.2, instructed to have patient hold spironolactone and losartan until lab rechecked tomorrow morning.  Also instructed him to take extra 40mg  lasix today only.  Aware and agreeable.

## 2014-08-25 NOTE — Patient Instructions (Addendum)
Follow up in 1 month with Dr Haroldine Laws  Stop ramipril.   Take losartan 25 mg daily at bed time.    Continue lasix 80 mg once a day    Start Serevent 1 puff twice a day   Do the following things EVERYDAY: 1) Weigh yourself in the morning before breakfast. Write it down and keep it in a log. 2) Take your medicines as prescribed 3) Eat low salt foods-Limit salt (sodium) to 2000 mg per day.  4) Stay as active as you can everyday 5) Limit all fluids for the day to less than 2 liters

## 2014-08-26 ENCOUNTER — Telehealth (HOSPITAL_COMMUNITY): Payer: Self-pay

## 2014-08-26 ENCOUNTER — Ambulatory Visit (HOSPITAL_COMMUNITY)
Admission: RE | Admit: 2014-08-26 | Discharge: 2014-08-26 | Disposition: A | Discharge status: Home / Self Care | Payer: Medicare Other | Source: Ambulatory Visit | Attending: Internal Medicine | Admitting: Internal Medicine

## 2014-08-26 DIAGNOSIS — I509 Heart failure, unspecified: Secondary | ICD-10-CM | POA: Diagnosis not present

## 2014-08-26 DIAGNOSIS — I5042 Chronic combined systolic (congestive) and diastolic (congestive) heart failure: Secondary | ICD-10-CM | POA: Insufficient documentation

## 2014-08-26 DIAGNOSIS — I5022 Chronic systolic (congestive) heart failure: Secondary | ICD-10-CM | POA: Diagnosis not present

## 2014-08-26 LAB — BASIC METABOLIC PANEL
Anion gap: 10 (ref 5–15)
BUN: 24 mg/dL — ABNORMAL HIGH (ref 6–23)
CO2: 26 mEq/L (ref 19–32)
Calcium: 9 mg/dL (ref 8.4–10.5)
Chloride: 99 mEq/L (ref 96–112)
Creatinine, Ser: 0.96 mg/dL (ref 0.50–1.35)
GFR calc Af Amer: >90 mL/min (ref >90)
GFR calc non Af Amer: 83 mL/min — ABNORMAL LOW (ref >90)
Glucose, Bld: 98 mg/dL (ref 70–99)
Potassium: 5.1 mEq/L (ref 3.7–5.3)
Sodium: 135 mEq/L — ABNORMAL LOW (ref 137–147)

## 2014-08-26 NOTE — Telephone Encounter (Signed)
Lab results reviewed with patient, instructed to continue to hold spiro and losartan until further notice for potassium of 5.1.  Will DC from med list.  Aware and agreeable.

## 2014-09-01 ENCOUNTER — Encounter: Payer: Self-pay | Admitting: Cardiovascular Disease

## 2014-09-01 ENCOUNTER — Telehealth: Payer: Self-pay | Admitting: Cardiovascular Disease

## 2014-09-01 ENCOUNTER — Ambulatory Visit (INDEPENDENT_AMBULATORY_CARE_PROVIDER_SITE_OTHER): Payer: Medicare Other | Admitting: Cardiovascular Disease

## 2014-09-01 VITALS — BP 122/52 | HR 62 | Resp 16 | Ht 68.0 in | Wt 264.5 lb

## 2014-09-01 DIAGNOSIS — I255 Ischemic cardiomyopathy: Secondary | ICD-10-CM

## 2014-09-01 DIAGNOSIS — I2510 Atherosclerotic heart disease of native coronary artery without angina pectoris: Secondary | ICD-10-CM | POA: Diagnosis not present

## 2014-09-01 DIAGNOSIS — I2589 Other forms of chronic ischemic heart disease: Secondary | ICD-10-CM

## 2014-09-01 DIAGNOSIS — I5022 Chronic systolic (congestive) heart failure: Secondary | ICD-10-CM | POA: Diagnosis not present

## 2014-09-01 LAB — MDC_IDC_ENUM_SESS_TYPE_INCLINIC
Date Time Interrogation Session: 20150922040000
HighPow Impedance: 36 Ohm
HighPow Impedance: 51 Ohm
Implantable Pulse Generator Serial Number: 265569
Lead Channel Impedance Value: 566 Ohm
Lead Channel Pacing Threshold Amplitude: 2.8 V
Lead Channel Pacing Threshold Pulse Width: 0.4 ms
Lead Channel Sensing Intrinsic Amplitude: 11 mV
Lead Channel Setting Pacing Amplitude: 4.5 V
Lead Channel Setting Pacing Pulse Width: 0.7 ms
Lead Channel Setting Sensing Sensitivity: 0.5 mV
Zone Setting Detection Interval: 273 ms
Zone Setting Detection Interval: 316 ms
Zone Setting Detection Interval: 375 ms

## 2014-09-01 NOTE — Patient Instructions (Addendum)
A referral has been placed to Dr. Cristopher Peru to discuss upgrading your device to BIV ICD (defibrillator).  Remote monitoring is used to monitor your ICD from home. This monitoring reduces the number of office visits required to check your device to one time per year. It allows [place] to keep an eye on the functioning of your device to ensure it is working properly. You are scheduled for a device check from home on 12-02-2014. You may send your transmission at any time that day. If you have a wireless device, the transmission will be sent automatically. After your physician reviews your transmission, you will receive a postcard with your next transmission date.  Your physician recommends that you schedule a follow-up appointment in: 12 months with Dr.Croitoru

## 2014-09-01 NOTE — Progress Notes (Addendum)
Patient ID: [name], male   DOB: 10/21/1946, 68 y.o.   MRN: [id number]     Reason for office visit ICD followup, CHF  [name] is a 68 y.o. male with a history of HTN, HLD, extensive PAD (with bilateral iliac, CFA and renal stents - and numerous angioplasties), CAD s/p CABG x2 in 1996 (chronic occlusion of left circumflex and RCA, 40% stenosis in LAD, both grafts occluded by cardiac catheterization in 2008, repeat July 2015), obesity, COPD with chronic dyspnea, tobacco abuse, carotid artery disease s/p CEA, AAA s/p recent stent grafting in 0/5397, chronic systolic CHF (EF 67-34%) with recent admission in March 2015 and now followed by the CHF clinic,[facility] on CPAP who presents today for defibrillator followup. Recently spironolactone and ramipril were discontinued for hypotension and hyperkalemia.  His defibrillator was implanted initially in 2006 (primary prevention) and he underwent a generator change out in 2011. His device is a [employer], single chamber device. After he left the office I found old records from Dr. Rollene Fare documenting appropriate defibrillator discharges for ventricular tachycardia in 2008   He has developed progressive broadening of his QRS complex which now has a typical left bundle branch block pattern with a duration of about 135 ms. he has had 3 hospitalizations for congestive heart failure in the last several months.  Device interrogation today shows normal function with no tachyarrhythmia or therapies and with no ventricular pacing. Of note during surgical procedures earlier this year he had fairly profound bradycardia. He has not had atrial fibrillation in the last several years, since the defibrillator generator change. He is on chronic treatment with amiodarone for his treatment of a regular shocks for ventricular tachycardia in 2008.  Allergies  Allergen Reactions  . Ace Inhibitors     REACTION: cough  . Penicillins Other (See Comments)      "childhood allergy"  . Tussionex Pennkinetic Er [Hydrocod Polst-Cpm Polst Er] Other (See Comments)    "caused prostate problems"    Current Outpatient Prescriptions  Medication Sig Dispense Refill  . albuterol (PROVENTIL HFA;VENTOLIN HFA) 108 (90 BASE) MCG/ACT inhaler Inhale 2 puffs into the lungs every 6 (six) hours as needed for wheezing or shortness of breath.  1 Inhaler  3  . albuterol (PROVENTIL) (2.5 MG/3ML) 0.083% nebulizer solution Take 3 mLs (2.5 mg total) by nebulization every 6 (six) hours as needed for wheezing or shortness of breath.  75 mL  12  . amiodarone (CORDARONE) 200 MG tablet Take 200 mg by mouth daily.       Marland Kitchen aspirin 81 MG tablet Take 81 mg by mouth daily.      Marland Kitchen atorvastatin (LIPITOR) 40 MG tablet Take 1 tablet (40 mg total) by mouth daily.  90 tablet  3  . carvedilol (COREG) 25 MG tablet Take 1 tablet (25 mg total) by mouth 2 (two) times daily with a meal.  180 tablet  3  . clopidogrel (PLAVIX) 75 MG tablet Take 1 tablet (75 mg total) by mouth daily.  90 tablet  3  . finasteride (PROSCAR) 5 MG tablet Take 5 mg by mouth daily.      . fish oil-omega-3 fatty acids 1000 MG capsule Take 1 g by mouth daily.       . furosemide (LASIX) 80 MG tablet Take 80 mg by mouth daily.      . isosorbide mononitrate (IMDUR) 30 MG 24 hr tablet Take 1 tablet (30 mg total) by mouth at bedtime.  30 tablet  5  . levothyroxine (SYNTHROID, LEVOTHROID) 50 MCG tablet TAKE 1 TABLET (50 MCG TOTAL) BY MOUTH DAILY.  90 tablet  1  . Multiple Vitamins-Minerals (MENS 50+ MULTI VITAMIN/MIN) TABS Take by mouth daily.      . multivitamin-lutein (OCUVITE-LUTEIN) CAPS Take 1 capsule by mouth daily.        Marland Kitchen NITROSTAT 0.4 MG SL tablet Dissolve 1 tablet under the tongue every 5 minutes as   needed for chest pain (up   to 3 tabs in 15 min then   call 911)  100 tablet  0  . NON FORMULARY at bedtime. CPAP And during the day as needed      . nystatin-triamcinolone (MYCOLOG II) cream Apply 1 application  topically daily.      . ranolazine (RANEXA) 500 MG 12 hr tablet Take 1 tablet (500 mg total) by mouth 2 (two) times daily.  60 tablet  6  . salmeterol (SEREVENT) 50 MCG/DOSE diskus inhaler Inhale 1 puff into the lungs 2 (two) times daily.  1 Inhaler  12   No current facility-administered medications for this visit.    Past Medical History  Diagnosis Date  . Myocardial infarction   . Hypertension   . CHF (congestive heart failure)   . COPD (chronic obstructive pulmonary disease)   . GERD (gastroesophageal reflux disease)   . AAA (abdominal aortic aneurysm)     followed by Dr. Bridgett Larsson  . Tremor   . Dysrhythmia     ICD-defibrillator  . Carotid artery stenosis     LCEA - Dr. Bridgett Larsson in 2013  . Peripheral vascular disease     LCEA, L renal artery stent, bilat iliac stents, R SFA stenosis  . Adenomatous colon polyp 01/2004  . Diverticulosis   . AVM (arteriovenous malformation)   . CAD (coronary artery disease)     s/p CABGx2 in 1996  . HLD (hyperlipidemia)   . Hypothyroidism   . BPH (benign prostatic hypertrophy)   . Fatigue   . Automatic implantable cardioverter-defibrillator in situ   . OSA on CPAP     AHI durign total sleep 14.69/hr, during REM 50.91/hr  . H/O hiatal hernia   . Anemia     hx  . Complication of anesthesia     claustrophobic, unabe to lie on back more than 4 hours at time due to back  . Diabetes mellitus     DIET CONTROLLED  . Ischemic cardiomyopathy     Past Surgical History  Procedure Laterality Date  . Coronary artery bypass graft  11/04/1985    x2 - PDA and sequential DX-OM (Dr. Redmond Pulling)  . Cardiac catheterization  09/16/2007    occlusion of both vein grafts, no significant LAD disease or diagonal disease, Cfx collaterals from the left, 70% in-stent restenosis of L renal artery, normal L main, RCA occluded ostially (Dr. Adora Fridge)  . Coronary angioplasty  01/08/2004    cutting balloon atherectomy & percutaneous intervention of RCIA in-stent restenosis (Dr. Marella Chimes)  . Renal artery stent Left 03/24/2004    6x73m Genesis stent (Dr. RMarella Chimes  . Angioplasty      BILATERAL  LE  W/STENTS  . Endarterectomy  01/04/2012    Procedure: ENDARTERECTOMY CAROTID;left  Surgeon: BHinda Lenis MD;  Location: [facility]  Service: Vascular;  Laterality: Left;  with patch angioplasty  . Carotid endarterectomy Left 01/04/12  . Cardiac defibrillator placement  06/2005    Guidant Vitality HE - ischemic cardiomyopathy - Dr. RMarella Chimes .  Iron infusion  June 16, 2012  . Abdominal aortic endovascular stent graft [name] 01/06/2014    Procedure: ABDOMINAL AORTIC ENDOVASCULAR STENT GRAFT- GORE; ULTRASOUND GUIDED;  Surgeon: Conrad Rockland, MD;  Location: [place];  Service: Vascular;  Laterality: [name];  . Polypectomy    . Iliac artery stent Bilateral 03/1997    and L SFA PTA  . Icd generator change  05/02/2010    [employer]  . Transthoracic echocardiogram  12/2012    EF 30-35; LV mod to severely dilated, mod concentric hypertrophy, severe hypokinesis of inferolateral myocardium, moderate hypokineis of anteroseptal region, grade 1 diastolic dysfunction; mod MR; LA mod-severely dialted; RV mod dialted; RA mildly dilated; PA peak pressure 81mHg  . Nm myocar perf wall motion  09/2012    lexiscan myoview - mod-severe perfusion defect r/t infarct or scar w/mild periinfarct ishcemia in basal inferior, mid inferior, apical inferior, basal inferolateral & mid inferoalteral regions - EF 21% low risk scan  . Cardiac catheterization  10/03/2002    SVG sequentially to OM1 & OM2 totally occluded at ostium, SVG to PDA totally occluded within previously placed prox vein graft stent, smal distal AAA, bialt iliac stents with 30% left end-stent restenosis and 50% right end-stent restnosis(Dr. RGerrie Nordmann  . Cardiac catheterization  06/11/1998    L main with 20% narrowing in distal 1/3; LAD with 1st diagonla having 70% ostial narrowing, 2nd diagonal with 40% narrowing in prox third,  LIMA & RIMA widely patent; in-stent restenosis of RCIA with successful PTA and new prox SVTRCA stent for residual disease (Dr. RMarella Chimes    Family History  Problem Relation Age of Onset  . Colon cancer Sister   . Cancer Sister   . Hypertension Sister   . Hyperlipidemia Sister   . Diabetes Sister   . Heart disease Sister   . Heart disease Brother   . Lung cancer Mother   . Cancer Mother   . Diabetes Mother   . Hypertension Mother   . Hyperlipidemia Mother   . Heart attack Maternal Grandmother   . Colon polyps Sister   . Heart attack Father   . Heart disease Father   . COPD Sister   . Diabetes Daughter     History   Social History  . Marital Status: Married    Spouse Name: [name]    Number of Children: 2  . Years of Education: [name]   Occupational History  . retired pSolicitor    Social History Main Topics  . Smoking status: Former Smoker -- 1.50 packs/day for 40 years    Types: Cigarettes    Quit date: 12/11/2002  . Smokeless tobacco: Former USystems developer . Alcohol Use: No  . Drug Use: No  . Sexual Activity: Not Currently   Other Topics Concern  . Not on file   Social History Narrative  . No narrative on file    Review of systems: Chronic exertional dyspnea, NYHA functional class II-3, no recent problems with edema, no syncope or presyncope, no palpitations, no chest discomfort either at rest or with exertion. The patient also denies abdominal pain, nausea, vomiting, dysphagia, diarrhea, constipation, polyuria, polydipsia, dysuria, hematuria, frequency, urgency, abnormal bleeding or bruising, fever, chills, unexpected weight changes, mood swings, change in skin or hair texture, change in voice quality, auditory or visual problems, allergic reactions or rashes, new musculoskeletal complaints other than usual "aches and pains".   PHYSICAL EXAM BP 122/52  Pulse 62  Resp 16  Ht _0  (1.727 m)  Wt 119.976 kg (264 lb 8 oz)  BMI 40.23 kg/m2 Morbid  obesity limits the physical exam  General: Alert, oriented x3, no distress Head: no evidence of trauma, PERRL, EOMI, no exophtalmos or lid lag, no myxedema, no xanthelasma; normal ears, nose and oropharynx Neck: normal jugular venous pulsations and no hepatojugular reflux; brisk carotid pulses without delay and no carotid bruits Chest: clear to auscultation, no signs of consolidation by percussion or palpation, normal fremitus, symmetrical and full respiratory excursions, healthy defibrillator site Cardiovascular: Unable to define the apical impulse, regular rhythm, normal first and second heart sounds, no murmurs, rubs or gallops Abdomen: no tenderness or distention, no masses by palpation, no abnormal pulsatility or arterial bruits, normal bowel sounds, no hepatosplenomegaly Extremities: no clubbing, cyanosis or edema; 2+ radial, ulnar and brachial pulses bilaterally; 2+ right femoral, posterior tibial and dorsalis pedis pulses; 2+ left femoral, posterior tibial and dorsalis pedis pulses; no subclavian or femoral bruits Neurological: grossly nonfocal   EKG: Sinus rhythm, left bundle branch block, QRS 135 ms, QTC 477 ms, no ischemic appearing repolarization abnormalities  Lipid Panel     Component Value Date/Time   CHOL 137 07/04/2014 0135   TRIG 127 07/04/2014 0135   HDL 44 07/04/2014 0135   CHOLHDL 3.1 07/04/2014 0135   VLDL 25 07/04/2014 0135   LDLCALC 68 07/04/2014 0135    BMET    Component Value Date/Time   NA 135* 08/26/2014 1037   NA 139 10/03/2013 1036   K 5.1 08/26/2014 1037   K 5.1 10/03/2013 1036   CL 99 08/26/2014 1037   CL 94* 10/02/2012 0937   CO2 26 08/26/2014 1037   CO2 28 10/03/2013 1036   GLUCOSE 98 08/26/2014 1037   GLUCOSE 141* 10/03/2013 1036   GLUCOSE 190* 10/02/2012 0937   BUN 24* 08/26/2014 1037   BUN 22.4 10/03/2013 1036   CREATININE 0.96 08/26/2014 1037   CREATININE 0.75 10/31/2013 1216   CREATININE 1.0 10/03/2013 1036   CALCIUM 9.0 08/26/2014 1037   CALCIUM  9.9 10/03/2013 1036   GFRNONAA 83* 08/26/2014 1037   GFRAA >90 08/26/2014 1037     ASSESSMENT AND PLAN  Mr. Hoying has moderate to severe ischemic cardiomyopathy with frequent episodes of acute exacerbation of chronic combined systolic and diastolic heart failure as well as a major intraventricular conduction delay with atypical LBBB pattern and a fairly long QRS complex. He would benefit from device upgrade to CRT-D. We'll refer him to Dr. Lovena Le to discuss this.  He appears to be euvolemic today and no changes are made in his medications. His diastolic blood pressure is quite low. For this reason he is not currently receiving ACE inhibitors. He has a followup appointment scheduled in the CHF clinic in just a few weeks.  Pending his evaluation by Dr. Lovena Le, will continue remote device monitoring every 3 months.  We'll try to retrieve Dr. Lowella Fairy old paper records to clarify the indication for amiodarone therapy. He is not on anticoagulants and has not had detected atrial tachyarrhythmias at least since I first met in in 2011.  Addendum (09/09/14) ____ reviewed old paper records and found evidence of appropriate ICD discharge for ventricular tachycardia in 2008, after which amiodarone was started (Dr. Lovena Le was consulted at that time).  Advised to continue daily weight monitoring and sodium restriction. He would like to restart taking salt substitutes, which was stopped when he developed symptomatic hyperkalemia. At that time he was also taking an ACE  inhibitor and an aldosterone antagonist. I think he can start using salt substitute again, especially since he is on a relatively high dose of loop diuretic without any potassium supplements. Advised that he have a repeat metabolic panel checked next month when he goes to the heart failure clinic.  Orders Placed This Encounter  Procedures  . EKG 12-Lead   No orders of the defined types were placed in this encounter.     Holli Humbles, MD, [place] [phone] office [phone] pager

## 2014-09-01 NOTE — Telephone Encounter (Signed)
Left message for patient to call regarding appointment to see Dr. Crissie Sickles.

## 2014-09-09 ENCOUNTER — Encounter: Payer: Self-pay | Admitting: Cardiovascular Disease

## 2014-09-09 DIAGNOSIS — I472 Ventricular tachycardia, unspecified: Secondary | ICD-10-CM

## 2014-09-09 HISTORY — DX: Ventricular tachycardia: I47.2

## 2014-09-09 HISTORY — DX: Ventricular tachycardia, unspecified: I47.20

## 2014-09-10 ENCOUNTER — Encounter: Payer: Self-pay | Admitting: Cardiovascular Disease

## 2014-09-15 ENCOUNTER — Ambulatory Visit (INDEPENDENT_AMBULATORY_CARE_PROVIDER_SITE_OTHER): Payer: Medicare Other | Admitting: Internal Medicine

## 2014-09-15 ENCOUNTER — Encounter: Payer: Self-pay | Admitting: *Deleted

## 2014-09-15 ENCOUNTER — Encounter: Payer: Self-pay | Admitting: Internal Medicine

## 2014-09-15 VITALS — BP 118/68 | HR 64 | Ht 68.5 in | Wt 267.4 lb

## 2014-09-15 DIAGNOSIS — Z95810 Presence of automatic (implantable) cardiac defibrillator: Secondary | ICD-10-CM | POA: Diagnosis not present

## 2014-09-15 DIAGNOSIS — I5043 Acute on chronic combined systolic (congestive) and diastolic (congestive) heart failure: Secondary | ICD-10-CM

## 2014-09-15 NOTE — Progress Notes (Signed)
HPI [name] is referred today by Dr.  Sallyanne Kuster for consideration for biventricular ICD upgrade. He is a very pleasant 68 year old man with chronic systolic heart failure, class III, left bundle branch block, with a QRS duration of 135 ms, prior coronary artery disease and bypass grafting, with multiple remote stents status post percutaneous coronary intervention, ventricular tachycardia with VT storm in 2008, managed with chronic suppressive amiodarone therapy. The patient's heart failure symptoms has worsened over the past several months. He has not had syncope nor has he had any recent ICD shock. He has chronic peripheral edema, class II. He admits to some dietary indiscretion, but he is trying to lose weight. Allergies  Allergen Reactions  . Tussionex Pennkinetic Er [Hydrocod Polst-Cpm Polst Er] Other (See Comments)    "caused prostate problems"  . Ace Inhibitors Other (See Comments)    REACTION: cough  . Penicillins Other (See Comments)    "childhood allergy"     Current Outpatient Prescriptions  Medication Sig Dispense Refill  . albuterol (PROVENTIL HFA;VENTOLIN HFA) 108 (90 BASE) MCG/ACT inhaler Inhale 2 puffs into the lungs every 6 (six) hours as needed for wheezing or shortness of breath.  1 Inhaler  3  . albuterol (PROVENTIL) (2.5 MG/3ML) 0.083% nebulizer solution Take 3 mLs (2.5 mg total) by nebulization every 6 (six) hours as needed for wheezing or shortness of breath.  75 mL  12  . amiodarone (CORDARONE) 200 MG tablet Take 200 mg by mouth daily.       Marland Kitchen aspirin 81 MG tablet Take 81 mg by mouth daily.      Marland Kitchen atorvastatin (LIPITOR) 40 MG tablet Take 1 tablet (40 mg total) by mouth daily.  90 tablet  3  . carvedilol (COREG) 25 MG tablet Take 1 tablet (25 mg total) by mouth 2 (two) times daily with a meal.  180 tablet  3  . clopidogrel (PLAVIX) 75 MG tablet Take 1 tablet (75 mg total) by mouth daily.  90 tablet  3  . finasteride (PROSCAR) 5 MG tablet Take 5 mg by mouth  daily.      . fish oil-omega-3 fatty acids 1000 MG capsule Take 1 g by mouth daily.       . furosemide (LASIX) 80 MG tablet Take 80 mg by mouth daily.      . isosorbide mononitrate (IMDUR) 30 MG 24 hr tablet Take 1 tablet (30 mg total) by mouth at bedtime.  30 tablet  5  . levothyroxine (SYNTHROID, LEVOTHROID) 50 MCG tablet TAKE 1 TABLET (50 MCG TOTAL) BY MOUTH DAILY.  90 tablet  1  . Multiple Vitamins-Minerals (MENS 50+ MULTI VITAMIN/MIN) TABS Take 1 tablet by mouth daily.       . multivitamin-lutein (OCUVITE-LUTEIN) CAPS Take 1 capsule by mouth daily.        Marland Kitchen NITROSTAT 0.4 MG SL tablet Dissolve 1 tablet under the tongue every 5 minutes as   needed for chest pain (up   to 3 tabs in 15 min then   call 911)  100 tablet  0  . NON FORMULARY at bedtime. CPAP And during the day as needed      . nystatin-triamcinolone (MYCOLOG II) cream Apply 1 application topically daily.      . salmeterol (SEREVENT) 50 MCG/DOSE diskus inhaler Inhale 1 puff into the lungs 2 (two) times daily.  1 Inhaler  12   No current facility-administered medications for this visit.     Past Medical History  Diagnosis Date  . Myocardial infarction   . Hypertension   . CHF (congestive heart failure)   . COPD (chronic obstructive pulmonary disease)   . GERD (gastroesophageal reflux disease)   . AAA (abdominal aortic aneurysm)     followed by Dr. Bridgett Larsson  . Tremor   . Dysrhythmia     ICD-defibrillator  . Carotid artery stenosis     LCEA - Dr. Bridgett Larsson in 2013  . Peripheral vascular disease     LCEA, L renal artery stent, bilat iliac stents, R SFA stenosis  . Adenomatous colon polyp 01/2004  . Diverticulosis   . AVM (arteriovenous malformation)   . CAD (coronary artery disease)     s/p CABGx2 in 1996  . HLD (hyperlipidemia)   . Hypothyroidism   . BPH (benign prostatic hypertrophy)   . Fatigue   . Automatic implantable cardioverter-defibrillator in situ   . OSA on CPAP     AHI durign total sleep 14.69/hr, during REM  50.91/hr  . H/O hiatal hernia   . Anemia     hx  . Complication of anesthesia     claustrophobic, unabe to lie on back more than 4 hours at time due to back  . Diabetes mellitus     DIET CONTROLLED  . Ischemic cardiomyopathy   . Ventricular tachycardia 09/09/2014    Amiodarone was started after appropriate defibrillator shocks for ventricular tachycardia in October 2008    ROS:   All systems reviewed and negative except as noted in the HPI.   Past Surgical History  Procedure Laterality Date  . Coronary artery bypass graft  11/04/1985    x2 - PDA and sequential DX-OM (Dr. Redmond Pulling)  . Cardiac catheterization  09/16/2007    occlusion of both vein grafts, no significant LAD disease or diagonal disease, Cfx collaterals from the left, 70% in-stent restenosis of L renal artery, normal L main, RCA occluded ostially (Dr. Adora Fridge)  . Coronary angioplasty  01/08/2004    cutting balloon atherectomy & percutaneous intervention of RCIA in-stent restenosis (Dr. Marella Chimes)  . Renal artery stent Left 03/24/2004    6x9mm Genesis stent (Dr. Marella Chimes)  . Angioplasty      BILATERAL  LE  W/STENTS  . Endarterectomy  01/04/2012    Procedure: ENDARTERECTOMY CAROTID;left  Surgeon: Hinda Lenis, MD;  Location: [place];  Service: Vascular;  Laterality: Left;  with patch angioplasty  . Carotid endarterectomy Left 01/04/12  . Cardiac defibrillator placement  06/2005    Guidant Vitality HE - ischemic cardiomyopathy - Dr. Marella Chimes  . Iron infusion  June 16, 2012  . Abdominal aortic endovascular stent graft [name] 01/06/2014    Procedure: ABDOMINAL AORTIC ENDOVASCULAR STENT GRAFT- GORE; ULTRASOUND GUIDED;  Surgeon: Conrad Monowi, MD;  Location: [place];  Service: Vascular;  Laterality: [name];  . Polypectomy    . Iliac artery stent Bilateral 03/1997    and L SFA PTA  . Icd generator change  05/02/2010    [employer]  . Transthoracic echocardiogram  12/2012    EF 30-35; LV mod to severely  dilated, mod concentric hypertrophy, severe hypokinesis of inferolateral myocardium, moderate hypokineis of anteroseptal region, grade 1 diastolic dysfunction; mod MR; LA mod-severely dialted; RV mod dialted; RA mildly dilated; PA peak pressure 51mmHg  . Nm myocar perf wall motion  09/2012    lexiscan myoview - mod-severe perfusion defect r/t infarct or scar w/mild periinfarct ishcemia in basal inferior, mid inferior, apical inferior, basal inferolateral &  mid inferoalteral regions - EF 21% low risk scan  . Cardiac catheterization  10/03/2002    SVG sequentially to OM1 & OM2 totally occluded at ostium, SVG to PDA totally occluded within previously placed prox vein graft stent, smal distal AAA, bialt iliac stents with 30% left end-stent restenosis and 50% right end-stent restnosis(Dr. Gerrie Nordmann)  . Cardiac catheterization  06/11/1998    L main with 20% narrowing in distal 1/3; LAD with 1st diagonla having 70% ostial narrowing, 2nd diagonal with 40% narrowing in prox third, LIMA & RIMA widely patent; in-stent restenosis of RCIA with successful PTA and new prox SVTRCA stent for residual disease (Dr. Marella Chimes)     Family History  Problem Relation Age of Onset  . Colon cancer Sister   . Cancer Sister   . Hypertension Sister   . Hyperlipidemia Sister   . Diabetes Sister   . Heart disease Sister   . Heart disease Brother   . Lung cancer Mother   . Cancer Mother   . Diabetes Mother   . Hypertension Mother   . Hyperlipidemia Mother   . Heart attack Maternal Grandmother   . Colon polyps Sister   . Heart attack Father   . Heart disease Father   . COPD Sister   . Diabetes Daughter      History   Social History  . Marital Status: Married    Spouse Name: [name]    Number of Children: 2  . Years of Education: [name]   Occupational History  . retired Solicitor     Social History Main Topics  . Smoking status: Former Smoker -- 1.50 packs/day for 40 years    Types: Cigarettes      Quit date: 12/11/2002  . Smokeless tobacco: Former Systems developer  . Alcohol Use: No  . Drug Use: No  . Sexual Activity: Not Currently   Other Topics Concern  . Not on file   Social History Narrative  . No narrative on file     BP 118/68  Pulse 64  Ht 5' 8.5" (1.74 m)  Wt 267 lb 6.4 oz (121.292 kg)  BMI 40.06 kg/m2  Physical Exam:  Well appearing obese man, NAD HEENT: Unremarkable Neck:  No JVD, no thyromegally Back:  No CVA tenderness Lungs:  Clear with no wheezes, rales, or rhonchi. HEART:  Regular rate rhythm, no murmurs, no rubs, no clicks Abd:  soft, obese, positive bowel sounds, no organomegally, no rebound, no guarding Ext:  2 plus pulses, 1+ peripheral edema, no cyanosis, no clubbing Skin:  No rashes no nodules Neuro:  CN II through XII intact, motor grossly intact  EKG - normal sinus rhythm with left bundle branch block  DEVICE  Normal device function.  See PaceArt for details.   Assess/Plan:

## 2014-09-15 NOTE — Assessment & Plan Note (Signed)
His [employer] ICD is working normally. We'll plan to schedule device upgrade in the next several weeks.

## 2014-09-15 NOTE — Assessment & Plan Note (Signed)
His symptoms are currently class III. We discussed the treatment options in detail. The risk, goals, benefits, and expectations of insertion of a biventricular ICD have been discussed with the patient and his wife, and he wishes to proceed.

## 2014-09-15 NOTE — Patient Instructions (Signed)
Your physician has recommended that you have a defibrillator inserted. An implantable cardioverter defibrillator (ICD) is a small device that is placed in your chest or, in rare cases, your abdomen. This device uses electrical pulses or shocks to help control life-threatening, irregular heartbeats that could lead the heart to suddenly stop beating (sudden cardiac arrest). Leads are attached to the ICD that goes into your heart. This is done in the hospital and usually requires an overnight stay. Please see the instruction sheet given to you today for more information.  Your physician recommends that you return for lab work on Monday, Oct 05, 2014. (CBC, BMET, PT/INR)

## 2014-09-17 ENCOUNTER — Encounter (HOSPITAL_COMMUNITY): Payer: Medicare Other

## 2014-09-17 ENCOUNTER — Other Ambulatory Visit: Payer: Self-pay | Admitting: *Deleted

## 2014-09-17 DIAGNOSIS — I5043 Acute on chronic combined systolic (congestive) and diastolic (congestive) heart failure: Secondary | ICD-10-CM

## 2014-09-17 DIAGNOSIS — I255 Ischemic cardiomyopathy: Secondary | ICD-10-CM

## 2014-10-01 ENCOUNTER — Ambulatory Visit (HOSPITAL_COMMUNITY)
Admission: RE | Admit: 2014-10-01 | Discharge: 2014-10-01 | Disposition: A | Discharge status: Home / Self Care | Payer: Medicare Other | Source: Ambulatory Visit | Attending: Cardiology | Admitting: Cardiology

## 2014-10-01 ENCOUNTER — Encounter (HOSPITAL_COMMUNITY): Payer: Self-pay

## 2014-10-01 ENCOUNTER — Other Ambulatory Visit: Payer: Self-pay

## 2014-10-01 VITALS — BP 142/62 | HR 70 | Wt 270.0 lb

## 2014-10-01 DIAGNOSIS — Z951 Presence of aortocoronary bypass graft: Secondary | ICD-10-CM | POA: Diagnosis not present

## 2014-10-01 DIAGNOSIS — I5022 Chronic systolic (congestive) heart failure: Secondary | ICD-10-CM | POA: Insufficient documentation

## 2014-10-01 DIAGNOSIS — I447 Left bundle-branch block, unspecified: Secondary | ICD-10-CM | POA: Insufficient documentation

## 2014-10-01 DIAGNOSIS — G4733 Obstructive sleep apnea (adult) (pediatric): Secondary | ICD-10-CM | POA: Diagnosis not present

## 2014-10-01 DIAGNOSIS — I255 Ischemic cardiomyopathy: Secondary | ICD-10-CM | POA: Diagnosis not present

## 2014-10-01 DIAGNOSIS — Z7901 Long term (current) use of anticoagulants: Secondary | ICD-10-CM | POA: Diagnosis not present

## 2014-10-01 DIAGNOSIS — I2510 Atherosclerotic heart disease of native coronary artery without angina pectoris: Secondary | ICD-10-CM | POA: Insufficient documentation

## 2014-10-01 DIAGNOSIS — I472 Ventricular tachycardia, unspecified: Secondary | ICD-10-CM

## 2014-10-01 DIAGNOSIS — Z7982 Long term (current) use of aspirin: Secondary | ICD-10-CM | POA: Diagnosis not present

## 2014-10-01 DIAGNOSIS — J449 Chronic obstructive pulmonary disease, unspecified: Secondary | ICD-10-CM | POA: Insufficient documentation

## 2014-10-01 DIAGNOSIS — I519 Heart disease, unspecified: Secondary | ICD-10-CM

## 2014-10-01 DIAGNOSIS — Z79899 Other long term (current) drug therapy: Secondary | ICD-10-CM | POA: Insufficient documentation

## 2014-10-01 DIAGNOSIS — I5189 Other ill-defined heart diseases: Secondary | ICD-10-CM

## 2014-10-01 DIAGNOSIS — Z87891 Personal history of nicotine dependence: Secondary | ICD-10-CM | POA: Diagnosis not present

## 2014-10-01 DIAGNOSIS — Z95810 Presence of automatic (implantable) cardiac defibrillator: Secondary | ICD-10-CM | POA: Insufficient documentation

## 2014-10-01 LAB — BASIC METABOLIC PANEL
Anion gap: 10 (ref 5–15)
BUN: 16 mg/dL (ref 6–23)
CO2: 31 mEq/L (ref 19–32)
Calcium: 9.3 mg/dL (ref 8.4–10.5)
Chloride: 103 mEq/L (ref 96–112)
Creatinine, Ser: 0.86 mg/dL (ref 0.50–1.35)
GFR calc Af Amer: >90 mL/min (ref >90)
GFR calc non Af Amer: 86 mL/min — ABNORMAL LOW (ref >90)
Glucose, Bld: 102 mg/dL — ABNORMAL HIGH (ref 70–99)
Potassium: 4 mEq/L (ref 3.7–5.3)
Sodium: 144 mEq/L (ref 137–147)

## 2014-10-01 LAB — PRO B NATRIURETIC PEPTIDE: Pro B Natriuretic peptide (BNP): 313 pg/mL — ABNORMAL HIGH (ref 0–125)

## 2014-10-01 MED ORDER — LEVOTHYROXINE SODIUM 50 MCG PO TABS: ORAL_TABLET | ORAL | Status: DC

## 2014-10-01 MED ORDER — FUROSEMIDE 80 MG PO TABS: ORAL_TABLET | ORAL | Status: DC

## 2014-10-01 MED ORDER — SACUBITRIL-VALSARTAN 24-26 MG PO TABS: 24.0000 mg | ORAL_TABLET | Freq: Two times a day (BID) | ORAL | Status: DC

## 2014-10-01 NOTE — Patient Instructions (Signed)
Please begin Entresto 24-26 mg twice daily Increase Lasix to 80 mg in am and 40 mg in pm Follow up in 10 days in the [facility] for labwork and in 3 weeks

## 2014-10-01 NOTE — Progress Notes (Signed)
Patient ID: [name], male   DOB: [birth year], 68 y.o.   MRN: [id number]  [facility]  10/01/2014 [name]   06/21/1946  [id number]  Primary Physicia [name], MD Hematologist: Dr Alen Blew  Pulmonary: Dr Annamaria Boots  HPI:  [name] is a pleasant 68 yo with a past medical history significant for morbid obesity, systolic heart failure with an EF of 30-35% (January 2014) -> 20-25% (March 2015 - RV normal), AICD placement, VT on amio, CAD s/p CABG 1996,LBBB, COPD, obstructive sleep apnea on CPAP, AAA repair (January 2015) who was admitted to [facility] on 02/24/14 for acute on chronic HF and acute COPD exacerbation. He also had iron deficiency anemia.   Admitted to [facility] July 24 through July 06 2014 with chest pain.  CEs negative. Had a LHC with no change from previous LHC with recommendations to continue medical management. Discharge weight was 255 pounds.   LHC 07/06/14 --No significant change from previous studies.  Left anterior descending (LAD): The LAD is a large vessel. There is a 40% stenosis immediately after the takeoff of a large septal perforator. There are 2 large diagonal branches without significant disease.  Left circumflex (LCx): The LCx is occluded proximally. There are left to left collaterals.  Right coronary artery (RCA): The RCA is occluded proximally immediately following the conus branch. There are right to right and left to right collaterals.  SVGs from CABG known to be totally occluded.   Since last appointment, patient has stopped ramipril due to cough and later losartan and spironolactone due to hyperkalemia.  He was using a potassium-containing salt substitute.  He has stopped this.  Cough resolved off ACEI.  Today, he is symptomatically stable.  He takes Lasix 80 mg daily and takes an extra 80 mg in the evening about once a month.  He fatigues easily.  He is not short of breath on flat ground if he walks slowly.  He gets short of breath when hurrying or  walking up stairs.  He additionally has moderate COPD that likely contributes to symptoms.    ECG (9/15): NSR, LBBB (QRS 136 msec)  Labs 3/15 Cr 1.5 K 5.6 Labs 07/06/14 K 3.9 Creatinine  0.98 Labs 9/15 K 5.1, creatinine 0.96   Current Outpatient Prescriptions  Medication Sig Dispense Refill  . albuterol (PROVENTIL HFA;VENTOLIN HFA) 108 (90 BASE) MCG/ACT inhaler Inhale 2 puffs into the lungs every 6 (six) hours as needed for wheezing or shortness of breath.  1 Inhaler  3  . albuterol (PROVENTIL) (2.5 MG/3ML) 0.083% nebulizer solution Take 3 mLs (2.5 mg total) by nebulization every 6 (six) hours as needed for wheezing or shortness of breath.  75 mL  12  . amiodarone (CORDARONE) 200 MG tablet Take 200 mg by mouth daily.       Marland Kitchen aspirin 81 MG tablet Take 81 mg by mouth daily.      Marland Kitchen atorvastatin (LIPITOR) 40 MG tablet Take 1 tablet (40 mg total) by mouth daily.  90 tablet  3  . carvedilol (COREG) 25 MG tablet Take 1 tablet (25 mg total) by mouth 2 (two) times daily with a meal.  180 tablet  3  . clopidogrel (PLAVIX) 75 MG tablet Take 1 tablet (75 mg total) by mouth daily.  90 tablet  3  . finasteride (PROSCAR) 5 MG tablet Take 5 mg by mouth daily.      . fish oil-omega-3 fatty acids 1000 MG capsule Take 1 g by mouth daily.       Marland Kitchen  furosemide (LASIX) 80 MG tablet Take 80 mg in am and 40 mg in pm  60 tablet  3  . isosorbide mononitrate (IMDUR) 30 MG 24 hr tablet Take 1 tablet (30 mg total) by mouth at bedtime.  30 tablet  5  . levothyroxine (SYNTHROID, LEVOTHROID) 50 MCG tablet TAKE 1 TABLET (50 MCG TOTAL) BY MOUTH DAILY.  90 tablet  0  . Multiple Vitamins-Minerals (MENS 50+ MULTI VITAMIN/MIN) TABS Take 1 tablet by mouth daily.       . multivitamin-lutein (OCUVITE-LUTEIN) CAPS Take 1 capsule by mouth daily.        Marland Kitchen NITROSTAT 0.4 MG SL tablet Dissolve 1 tablet under the tongue every 5 minutes as   needed for chest pain (up   to 3 tabs in 15 min then   call 911)  100 tablet  0  . NON FORMULARY at  bedtime. CPAP And during the day as needed      . salmeterol (SEREVENT) 50 MCG/DOSE diskus inhaler Inhale 1 puff into the lungs 2 (two) times daily.  1 Inhaler  12  . nystatin-triamcinolone (MYCOLOG II) cream Apply 1 application topically daily.      . Sacubitril-Valsartan (ENTRESTO) 24-26 MG TABS Take 24-26 mg by mouth 2 (two) times daily.  60 tablet  3   No current facility-administered medications for this encounter.    Allergies  Allergen Reactions  . Tussionex Pennkinetic Er [Hydrocod Polst-Cpm Polst Er] Other (See Comments)    "caused prostate problems"  . Ace Inhibitors Other (See Comments)    REACTION: cough  . Penicillins Other (See Comments)    "childhood allergy"    History   Social History  . Marital Status: Married    Spouse Name: [name]    Number of Children: 2  . Years of Education: [name]   Occupational History  . retired Solicitor     Social History Main Topics  . Smoking status: Former Smoker -- 1.50 packs/day for 40 years    Types: Cigarettes    Quit date: 12/11/2002  . Smokeless tobacco: Former Systems developer  . Alcohol Use: No  . Drug Use: No  . Sexual Activity: Not Currently   Other Topics Concern  . Not on file   Social History Narrative  . No narrative on file    Blood pressure 142/62, pulse 70, weight 270 lb (122.471 kg), SpO2 95.00%.  General appearance: alert, cooperative and no distress Wife present.  Neck: no carotid bruit and JVP 8-9 cm. L CEA scar Lungs: Decreased breath sounds throughout lung fields, rhonchi noted.  Heart: regular rate and rhythm, S1, S2 normal, 2/6 SEM at RUSB  click, rub or gallop Extremities: 1+ edema to knees bilaterally.  Skin: warm and dry Neurologic: Grossly normal   ASSESSMENT/PLAN:  1. Chronicsystolic CHF: Ischemic cardiomyopathy. ECHO 02/2014 EF 20-25%.  [place]. NYHA III symptoms, stable. Likely combination of CHF and moderate COPD. He has some excess volume on exam today.  - Increase  Lasix to 80 qam, 40 qpm.  - Continue carvedilol 25 mg twice a day  - He is no longer using the K-containing salt substitute.  He is off ACEI, ARB, and spironolactone.  I am going to start him on Entresto 24/26 bid today.  - BMET/BNP today and repeat in 10 days.   - He will have upgrade to CRT-D later this month (LBBB).  2. CAD s/p CABG: No ischemic symptoms. Continue 81 mg aspirin daily and atorvastatin daily.  3. VT s/p ICD: On amiodarone for history of ICD discharges due to VT.  Patient will need LFTs/TSH checked at next appointment.  He should get yearly eye exams.  4. Morbid obesity: Needs to work on weight loss.  5. COPD: Moderate to severe.  I suspect that this contributes to his dyspnea.   Follow up in 3 wks   [name] 10/01/2014

## 2014-10-05 ENCOUNTER — Encounter (HOSPITAL_COMMUNITY): Payer: Self-pay | Admitting: Pharmacy Technician

## 2014-10-05 ENCOUNTER — Other Ambulatory Visit (INDEPENDENT_AMBULATORY_CARE_PROVIDER_SITE_OTHER): Payer: Medicare Other | Admitting: *Deleted

## 2014-10-05 DIAGNOSIS — Z95810 Presence of automatic (implantable) cardiac defibrillator: Secondary | ICD-10-CM

## 2014-10-05 DIAGNOSIS — I5043 Acute on chronic combined systolic (congestive) and diastolic (congestive) heart failure: Secondary | ICD-10-CM

## 2014-10-05 LAB — CBC WITH DIFFERENTIAL/PLATELET
Basophils Absolute: 0 10*3/uL (ref 0.0–0.1)
Basophils Relative: 0.5 % (ref 0.0–3.0)
Eosinophils Absolute: 0.2 10*3/uL (ref 0.0–0.7)
Eosinophils Relative: 3.6 % (ref 0.0–5.0)
HCT: 34.8 % — ABNORMAL LOW (ref 39.0–52.0)
Hemoglobin: 10.9 g/dL — ABNORMAL LOW (ref 13.0–17.0)
Lymphocytes Relative: 17.5 % (ref 12.0–46.0)
Lymphs Abs: 0.9 10*3/uL (ref 0.7–4.0)
MCHC: 31.4 g/dL (ref 30.0–36.0)
MCV: 86.6 fl (ref 78.0–100.0)
Monocytes Absolute: 0.5 10*3/uL (ref 0.1–1.0)
Monocytes Relative: 9 % (ref 3.0–12.0)
Neutro Abs: 3.7 10*3/uL (ref 1.4–7.7)
Neutrophils Relative %: 69.4 % (ref 43.0–77.0)
Platelets: 150 10*3/uL (ref 150.0–400.0)
RBC: 4.01 Mil/uL — ABNORMAL LOW (ref 4.22–5.81)
RDW: 15.4 % (ref 11.5–15.5)
WBC: 5.4 10*3/uL (ref 4.0–10.5)

## 2014-10-05 LAB — BASIC METABOLIC PANEL
BUN: 18 mg/dL (ref 6–23)
CO2: 25 mEq/L (ref 19–32)
Calcium: 8.8 mg/dL (ref 8.4–10.5)
Chloride: 105 mEq/L (ref 96–112)
Creatinine, Ser: 0.9 mg/dL (ref 0.4–1.5)
GFR: 84.39 mL/min (ref >60.00)
Glucose, Bld: 136 mg/dL — ABNORMAL HIGH (ref 70–99)
Potassium: 4.3 mEq/L (ref 3.5–5.1)
Sodium: 144 mEq/L (ref 135–145)

## 2014-10-05 LAB — PROTIME-INR
INR: 1 ratio (ref 0.8–1.0)
Prothrombin Time: 11.4 s (ref 9.6–13.1)

## 2014-10-08 DIAGNOSIS — I509 Heart failure, unspecified: Secondary | ICD-10-CM | POA: Diagnosis present

## 2014-10-08 DIAGNOSIS — I714 Abdominal aortic aneurysm, without rupture: Secondary | ICD-10-CM | POA: Diagnosis not present

## 2014-10-08 DIAGNOSIS — G4733 Obstructive sleep apnea (adult) (pediatric): Secondary | ICD-10-CM | POA: Diagnosis not present

## 2014-10-08 DIAGNOSIS — Z79899 Other long term (current) drug therapy: Secondary | ICD-10-CM | POA: Diagnosis not present

## 2014-10-08 DIAGNOSIS — I447 Left bundle-branch block, unspecified: Secondary | ICD-10-CM | POA: Diagnosis not present

## 2014-10-08 DIAGNOSIS — K219 Gastro-esophageal reflux disease without esophagitis: Secondary | ICD-10-CM | POA: Diagnosis not present

## 2014-10-08 DIAGNOSIS — E785 Hyperlipidemia, unspecified: Secondary | ICD-10-CM | POA: Diagnosis not present

## 2014-10-08 DIAGNOSIS — E119 Type 2 diabetes mellitus without complications: Secondary | ICD-10-CM | POA: Diagnosis not present

## 2014-10-08 DIAGNOSIS — Z951 Presence of aortocoronary bypass graft: Secondary | ICD-10-CM | POA: Diagnosis not present

## 2014-10-08 DIAGNOSIS — J449 Chronic obstructive pulmonary disease, unspecified: Secondary | ICD-10-CM | POA: Diagnosis not present

## 2014-10-08 DIAGNOSIS — I472 Ventricular tachycardia: Secondary | ICD-10-CM | POA: Diagnosis not present

## 2014-10-08 DIAGNOSIS — Z7902 Long term (current) use of antithrombotics/antiplatelets: Secondary | ICD-10-CM | POA: Diagnosis not present

## 2014-10-08 DIAGNOSIS — E669 Obesity, unspecified: Secondary | ICD-10-CM | POA: Diagnosis not present

## 2014-10-08 DIAGNOSIS — I252 Old myocardial infarction: Secondary | ICD-10-CM | POA: Diagnosis not present

## 2014-10-08 DIAGNOSIS — I5022 Chronic systolic (congestive) heart failure: Secondary | ICD-10-CM | POA: Diagnosis not present

## 2014-10-08 DIAGNOSIS — Z7982 Long term (current) use of aspirin: Secondary | ICD-10-CM | POA: Diagnosis not present

## 2014-10-08 DIAGNOSIS — I255 Ischemic cardiomyopathy: Secondary | ICD-10-CM | POA: Diagnosis not present

## 2014-10-08 DIAGNOSIS — I2510 Atherosclerotic heart disease of native coronary artery without angina pectoris: Secondary | ICD-10-CM | POA: Diagnosis not present

## 2014-10-08 DIAGNOSIS — Z6841 Body Mass Index (BMI) 40.0 and over, adult: Secondary | ICD-10-CM | POA: Diagnosis not present

## 2014-10-08 DIAGNOSIS — Z87891 Personal history of nicotine dependence: Secondary | ICD-10-CM | POA: Diagnosis not present

## 2014-10-08 MED ORDER — VANCOMYCIN HCL 10 G IV SOLR
1500.0000 mg | INTRAVENOUS | Status: DC
  Filled 2014-10-08 (×2): qty 1500

## 2014-10-08 MED ORDER — GENTAMICIN SULFATE 40 MG/ML IJ SOLN
80.0000 mg | INTRAMUSCULAR | Status: DC
  Filled 2014-10-08 (×2): qty 2

## 2014-10-08 MED ORDER — SODIUM CHLORIDE 0.9 % IV SOLN
INTRAVENOUS | Status: DC
  Administered 2014-10-09: 11:00:00 via INTRAVENOUS

## 2014-10-09 ENCOUNTER — Ambulatory Visit (HOSPITAL_COMMUNITY)
Admission: RE | Admit: 2014-10-09 | Discharge: 2014-10-10 | Disposition: A | Discharge status: Home / Self Care | Payer: Medicare Other | Source: Ambulatory Visit | Attending: Internal Medicine | Admitting: Internal Medicine

## 2014-10-09 ENCOUNTER — Encounter (HOSPITAL_COMMUNITY): Payer: Self-pay | Admitting: General Practice

## 2014-10-09 ENCOUNTER — Encounter (HOSPITAL_COMMUNITY)
Admission: RE | Disposition: A | Discharge status: Home / Self Care | Payer: Medicare Other | Source: Ambulatory Visit | Attending: Internal Medicine

## 2014-10-09 DIAGNOSIS — E119 Type 2 diabetes mellitus without complications: Secondary | ICD-10-CM | POA: Insufficient documentation

## 2014-10-09 DIAGNOSIS — I255 Ischemic cardiomyopathy: Secondary | ICD-10-CM | POA: Diagnosis not present

## 2014-10-09 DIAGNOSIS — E785 Hyperlipidemia, unspecified: Secondary | ICD-10-CM | POA: Insufficient documentation

## 2014-10-09 DIAGNOSIS — I714 Abdominal aortic aneurysm, without rupture: Secondary | ICD-10-CM | POA: Insufficient documentation

## 2014-10-09 DIAGNOSIS — G4733 Obstructive sleep apnea (adult) (pediatric): Secondary | ICD-10-CM | POA: Insufficient documentation

## 2014-10-09 DIAGNOSIS — I447 Left bundle-branch block, unspecified: Secondary | ICD-10-CM | POA: Insufficient documentation

## 2014-10-09 DIAGNOSIS — I5022 Chronic systolic (congestive) heart failure: Secondary | ICD-10-CM | POA: Diagnosis not present

## 2014-10-09 DIAGNOSIS — Z87891 Personal history of nicotine dependence: Secondary | ICD-10-CM | POA: Insufficient documentation

## 2014-10-09 DIAGNOSIS — Z7982 Long term (current) use of aspirin: Secondary | ICD-10-CM | POA: Insufficient documentation

## 2014-10-09 DIAGNOSIS — I472 Ventricular tachycardia: Secondary | ICD-10-CM | POA: Insufficient documentation

## 2014-10-09 DIAGNOSIS — Z951 Presence of aortocoronary bypass graft: Secondary | ICD-10-CM | POA: Insufficient documentation

## 2014-10-09 DIAGNOSIS — Z6841 Body Mass Index (BMI) 40.0 and over, adult: Secondary | ICD-10-CM | POA: Insufficient documentation

## 2014-10-09 DIAGNOSIS — Z95810 Presence of automatic (implantable) cardiac defibrillator: Secondary | ICD-10-CM

## 2014-10-09 DIAGNOSIS — I2510 Atherosclerotic heart disease of native coronary artery without angina pectoris: Secondary | ICD-10-CM | POA: Insufficient documentation

## 2014-10-09 DIAGNOSIS — Z79899 Other long term (current) drug therapy: Secondary | ICD-10-CM | POA: Insufficient documentation

## 2014-10-09 DIAGNOSIS — I5043 Acute on chronic combined systolic (congestive) and diastolic (congestive) heart failure: Secondary | ICD-10-CM

## 2014-10-09 DIAGNOSIS — I252 Old myocardial infarction: Secondary | ICD-10-CM | POA: Insufficient documentation

## 2014-10-09 DIAGNOSIS — K219 Gastro-esophageal reflux disease without esophagitis: Secondary | ICD-10-CM | POA: Insufficient documentation

## 2014-10-09 DIAGNOSIS — E669 Obesity, unspecified: Secondary | ICD-10-CM | POA: Insufficient documentation

## 2014-10-09 DIAGNOSIS — J449 Chronic obstructive pulmonary disease, unspecified: Secondary | ICD-10-CM | POA: Insufficient documentation

## 2014-10-09 DIAGNOSIS — Z7902 Long term (current) use of antithrombotics/antiplatelets: Secondary | ICD-10-CM | POA: Insufficient documentation

## 2014-10-09 HISTORY — PX: BIV ICD GENERTAOR CHANGE OUT: SHX5745

## 2014-10-09 HISTORY — PX: BI-VENTRICULAR IMPLANTABLE CARDIOVERTER DEFIBRILLATOR UPGRADE: SHX5461

## 2014-10-09 LAB — SURGICAL PCR SCREEN
MRSA, PCR: NEGATIVE
Staphylococcus aureus: POSITIVE — AB

## 2014-10-09 LAB — GLUCOSE, CAPILLARY: Glucose-Capillary: 96 mg/dL (ref 70–99)

## 2014-10-09 SURGERY — BI-VENTRICULAR IMPLANTABLE CARDIOVERTER DEFIBRILLATOR UPGRADE
Anesthesia: LOCAL

## 2014-10-09 MED ORDER — OCUVITE-LUTEIN PO CAPS: 1.0000 | ORAL_CAPSULE | Freq: Every day | ORAL | Status: DC

## 2014-10-09 MED ORDER — MIDAZOLAM HCL 5 MG/5ML IJ SOLN
INTRAMUSCULAR | Status: AC
  Filled 2014-10-09: qty 5

## 2014-10-09 MED ORDER — ALBUTEROL SULFATE HFA 108 (90 BASE) MCG/ACT IN AERS: 2.0000 | INHALATION_SPRAY | Freq: Four times a day (QID) | RESPIRATORY_TRACT | Status: DC | PRN

## 2014-10-09 MED ORDER — LEVOTHYROXINE SODIUM 50 MCG PO TABS
50.0000 ug | ORAL_TABLET | Freq: Every day | ORAL | Status: DC
  Administered 2014-10-10: 50 ug via ORAL
  Filled 2014-10-09 (×2): qty 1

## 2014-10-09 MED ORDER — ACETAMINOPHEN 325 MG PO TABS: 325.0000 mg | ORAL_TABLET | ORAL | Status: DC | PRN

## 2014-10-09 MED ORDER — SALMETEROL XINAFOATE 50 MCG/DOSE IN AEPB: 2.0000 | INHALATION_SPRAY | Freq: Every day | RESPIRATORY_TRACT | Status: DC | PRN

## 2014-10-09 MED ORDER — CHLORHEXIDINE GLUCONATE 4 % EX LIQD
60.0000 mL | Freq: Once | CUTANEOUS | Status: DC
  Administered 2014-10-09: 4 via TOPICAL
  Filled 2014-10-09: qty 60

## 2014-10-09 MED ORDER — FUROSEMIDE 40 MG PO TABS
80.0000 mg | ORAL_TABLET | Freq: Every day | ORAL | Status: DC
  Administered 2014-10-10: 80 mg via ORAL
  Filled 2014-10-09 (×2): qty 2

## 2014-10-09 MED ORDER — FINASTERIDE 5 MG PO TABS
5.0000 mg | ORAL_TABLET | Freq: Every day | ORAL | Status: DC
  Administered 2014-10-09: 5 mg via ORAL
  Filled 2014-10-09 (×2): qty 1

## 2014-10-09 MED ORDER — MUPIROCIN 2 % EX OINT
TOPICAL_OINTMENT | CUTANEOUS | Status: AC
  Administered 2014-10-09: 1
  Filled 2014-10-09: qty 22

## 2014-10-09 MED ORDER — FUROSEMIDE 40 MG PO TABS
40.0000 mg | ORAL_TABLET | Freq: Every day | ORAL | Status: DC
  Filled 2014-10-09 (×2): qty 1

## 2014-10-09 MED ORDER — LIDOCAINE HCL (PF) 1 % IJ SOLN
INTRAMUSCULAR | Status: AC
  Filled 2014-10-09: qty 30

## 2014-10-09 MED ORDER — ALBUTEROL SULFATE (2.5 MG/3ML) 0.083% IN NEBU: 2.5000 mg | INHALATION_SOLUTION | Freq: Four times a day (QID) | RESPIRATORY_TRACT | Status: DC | PRN

## 2014-10-09 MED ORDER — FENTANYL CITRATE 0.05 MG/ML IJ SOLN
25.0000 ug | INTRAMUSCULAR | Status: DC | PRN
  Administered 2014-10-09: 25 ug via INTRAVENOUS
  Filled 2014-10-09: qty 2

## 2014-10-09 MED ORDER — FENTANYL CITRATE 0.05 MG/ML IJ SOLN
INTRAMUSCULAR | Status: AC
  Filled 2014-10-09: qty 2

## 2014-10-09 MED ORDER — PROSIGHT PO TABS
1.0000 | ORAL_TABLET | Freq: Every day | ORAL | Status: DC
  Administered 2014-10-09: 1 via ORAL
  Filled 2014-10-09 (×2): qty 1

## 2014-10-09 MED ORDER — VANCOMYCIN HCL 10 G IV SOLR
1500.0000 mg | Freq: Two times a day (BID) | INTRAVENOUS | Status: AC
  Administered 2014-10-10: 1500 mg via INTRAVENOUS
  Filled 2014-10-09: qty 1500

## 2014-10-09 MED ORDER — CARVEDILOL 25 MG PO TABS
25.0000 mg | ORAL_TABLET | Freq: Two times a day (BID) | ORAL | Status: DC
  Administered 2014-10-09 – 2014-10-10 (×2): 25 mg via ORAL
  Filled 2014-10-09 (×4): qty 1

## 2014-10-09 MED ORDER — FUROSEMIDE 10 MG/ML IJ SOLN
40.0000 mg | Freq: Once | INTRAMUSCULAR | Status: AC
  Administered 2014-10-09: 40 mg via INTRAVENOUS
  Filled 2014-10-09: qty 4

## 2014-10-09 MED ORDER — HEPARIN (PORCINE) IN NACL 2-0.9 UNIT/ML-% IJ SOLN
INTRAMUSCULAR | Status: AC
  Filled 2014-10-09: qty 500

## 2014-10-09 MED ORDER — ONDANSETRON HCL 4 MG/2ML IJ SOLN: 4.0000 mg | Freq: Four times a day (QID) | INTRAMUSCULAR | Status: DC | PRN

## 2014-10-09 MED ORDER — CLOPIDOGREL BISULFATE 75 MG PO TABS
75.0000 mg | ORAL_TABLET | Freq: Every day | ORAL | Status: DC
  Administered 2014-10-09 – 2014-10-10 (×2): 75 mg via ORAL
  Filled 2014-10-09 (×3): qty 1

## 2014-10-09 MED ORDER — INFLUENZA VAC SPLIT QUAD 0.5 ML IM SUSY
0.5000 mL | PREFILLED_SYRINGE | INTRAMUSCULAR | Status: DC
  Filled 2014-10-09: qty 0.5

## 2014-10-09 MED ORDER — ATORVASTATIN CALCIUM 40 MG PO TABS
40.0000 mg | ORAL_TABLET | Freq: Every day | ORAL | Status: DC
  Administered 2014-10-09: 40 mg via ORAL
  Filled 2014-10-09 (×2): qty 1

## 2014-10-09 MED ORDER — AMIODARONE HCL 200 MG PO TABS
200.0000 mg | ORAL_TABLET | Freq: Every day | ORAL | Status: DC
  Filled 2014-10-09: qty 1

## 2014-10-09 MED ORDER — LIDOCAINE HCL (PF) 1 % IJ SOLN
INTRAMUSCULAR | Status: AC
  Filled 2014-10-09: qty 60

## 2014-10-09 NOTE — H&P (View-Only) (Signed)
HPI [name] is referred today by Dr.  Sallyanne Kuster for consideration for biventricular ICD upgrade. He is a very pleasant 68 year old man with chronic systolic heart failure, class III, left bundle branch block, with a QRS duration of 135 ms, prior coronary artery disease and bypass grafting, with multiple remote stents status post percutaneous coronary intervention, ventricular tachycardia with VT storm in 2008, managed with chronic suppressive amiodarone therapy. The patient's heart failure symptoms has worsened over the past several months. He has not had syncope nor has he had any recent ICD shock. He has chronic peripheral edema, class II. He admits to some dietary indiscretion, but he is trying to lose weight. Allergies  Allergen Reactions  . Tussionex Pennkinetic Er [Hydrocod Polst-Cpm Polst Er] Other (See Comments)    "caused prostate problems"  . Ace Inhibitors Other (See Comments)    REACTION: cough  . Penicillins Other (See Comments)    "childhood allergy"     Current Outpatient Prescriptions  Medication Sig Dispense Refill  . albuterol (PROVENTIL HFA;VENTOLIN HFA) 108 (90 BASE) MCG/ACT inhaler Inhale 2 puffs into the lungs every 6 (six) hours as needed for wheezing or shortness of breath.  1 Inhaler  3  . albuterol (PROVENTIL) (2.5 MG/3ML) 0.083% nebulizer solution Take 3 mLs (2.5 mg total) by nebulization every 6 (six) hours as needed for wheezing or shortness of breath.  75 mL  12  . amiodarone (CORDARONE) 200 MG tablet Take 200 mg by mouth daily.       Marland Kitchen aspirin 81 MG tablet Take 81 mg by mouth daily.      Marland Kitchen atorvastatin (LIPITOR) 40 MG tablet Take 1 tablet (40 mg total) by mouth daily.  90 tablet  3  . carvedilol (COREG) 25 MG tablet Take 1 tablet (25 mg total) by mouth 2 (two) times daily with a meal.  180 tablet  3  . clopidogrel (PLAVIX) 75 MG tablet Take 1 tablet (75 mg total) by mouth daily.  90 tablet  3  . finasteride (PROSCAR) 5 MG tablet Take 5 mg by mouth  daily.      . fish oil-omega-3 fatty acids 1000 MG capsule Take 1 g by mouth daily.       . furosemide (LASIX) 80 MG tablet Take 80 mg by mouth daily.      . isosorbide mononitrate (IMDUR) 30 MG 24 hr tablet Take 1 tablet (30 mg total) by mouth at bedtime.  30 tablet  5  . levothyroxine (SYNTHROID, LEVOTHROID) 50 MCG tablet TAKE 1 TABLET (50 MCG TOTAL) BY MOUTH DAILY.  90 tablet  1  . Multiple Vitamins-Minerals (MENS 50+ MULTI VITAMIN/MIN) TABS Take 1 tablet by mouth daily.       . multivitamin-lutein (OCUVITE-LUTEIN) CAPS Take 1 capsule by mouth daily.        Marland Kitchen NITROSTAT 0.4 MG SL tablet Dissolve 1 tablet under the tongue every 5 minutes as   needed for chest pain (up   to 3 tabs in 15 min then   call 911)  100 tablet  0  . NON FORMULARY at bedtime. CPAP And during the day as needed      . nystatin-triamcinolone (MYCOLOG II) cream Apply 1 application topically daily.      . salmeterol (SEREVENT) 50 MCG/DOSE diskus inhaler Inhale 1 puff into the lungs 2 (two) times daily.  1 Inhaler  12   No current facility-administered medications for this visit.     Past Medical History  Diagnosis Date  . Myocardial infarction   . Hypertension   . CHF (congestive heart failure)   . COPD (chronic obstructive pulmonary disease)   . GERD (gastroesophageal reflux disease)   . AAA (abdominal aortic aneurysm)     followed by Dr. Bridgett Larsson  . Tremor   . Dysrhythmia     ICD-defibrillator  . Carotid artery stenosis     LCEA - Dr. Bridgett Larsson in 2013  . Peripheral vascular disease     LCEA, L renal artery stent, bilat iliac stents, R SFA stenosis  . Adenomatous colon polyp 01/2004  . Diverticulosis   . AVM (arteriovenous malformation)   . CAD (coronary artery disease)     s/p CABGx2 in 1996  . HLD (hyperlipidemia)   . Hypothyroidism   . BPH (benign prostatic hypertrophy)   . Fatigue   . Automatic implantable cardioverter-defibrillator in situ   . OSA on CPAP     AHI durign total sleep 14.69/hr, during REM  50.91/hr  . H/O hiatal hernia   . Anemia     hx  . Complication of anesthesia     claustrophobic, unabe to lie on back more than 4 hours at time due to back  . Diabetes mellitus     DIET CONTROLLED  . Ischemic cardiomyopathy   . Ventricular tachycardia 09/09/2014    Amiodarone was started after appropriate defibrillator shocks for ventricular tachycardia in October 2008    ROS:   All systems reviewed and negative except as noted in the HPI.   Past Surgical History  Procedure Laterality Date  . Coronary artery bypass graft  11/04/1985    x2 - PDA and sequential DX-OM (Dr. Redmond Pulling)  . Cardiac catheterization  09/16/2007    occlusion of both vein grafts, no significant LAD disease or diagonal disease, Cfx collaterals from the left, 70% in-stent restenosis of L renal artery, normal L main, RCA occluded ostially (Dr. Adora Fridge)  . Coronary angioplasty  01/08/2004    cutting balloon atherectomy & percutaneous intervention of RCIA in-stent restenosis (Dr. Marella Chimes)  . Renal artery stent Left 03/24/2004    6x87mm Genesis stent (Dr. Marella Chimes)  . Angioplasty      BILATERAL  LE  W/STENTS  . Endarterectomy  01/04/2012    Procedure: ENDARTERECTOMY CAROTID;left  Surgeon: Hinda Lenis, MD;  Location: [place];  Service: Vascular;  Laterality: Left;  with patch angioplasty  . Carotid endarterectomy Left 01/04/12  . Cardiac defibrillator placement  06/2005    Guidant Vitality HE - ischemic cardiomyopathy - Dr. Marella Chimes  . Iron infusion  June 16, 2012  . Abdominal aortic endovascular stent graft [name] 01/06/2014    Procedure: ABDOMINAL AORTIC ENDOVASCULAR STENT GRAFT- GORE; ULTRASOUND GUIDED;  Surgeon: Conrad Farmland, MD;  Location: [place];  Service: Vascular;  Laterality: [name];  . Polypectomy    . Iliac artery stent Bilateral 03/1997    and L SFA PTA  . Icd generator change  05/02/2010    [employer]  . Transthoracic echocardiogram  12/2012    EF 30-35; LV mod to severely  dilated, mod concentric hypertrophy, severe hypokinesis of inferolateral myocardium, moderate hypokineis of anteroseptal region, grade 1 diastolic dysfunction; mod MR; LA mod-severely dialted; RV mod dialted; RA mildly dilated; PA peak pressure 30mmHg  . Nm myocar perf wall motion  09/2012    lexiscan myoview - mod-severe perfusion defect r/t infarct or scar w/mild periinfarct ishcemia in basal inferior, mid inferior, apical inferior, basal inferolateral &  mid inferoalteral regions - EF 21% low risk scan  . Cardiac catheterization  10/03/2002    SVG sequentially to OM1 & OM2 totally occluded at ostium, SVG to PDA totally occluded within previously placed prox vein graft stent, smal distal AAA, bialt iliac stents with 30% left end-stent restenosis and 50% right end-stent restnosis(Dr. Gerrie Nordmann)  . Cardiac catheterization  06/11/1998    L main with 20% narrowing in distal 1/3; LAD with 1st diagonla having 70% ostial narrowing, 2nd diagonal with 40% narrowing in prox third, LIMA & RIMA widely patent; in-stent restenosis of RCIA with successful PTA and new prox SVTRCA stent for residual disease (Dr. Marella Chimes)     Family History  Problem Relation Age of Onset  . Colon cancer Sister   . Cancer Sister   . Hypertension Sister   . Hyperlipidemia Sister   . Diabetes Sister   . Heart disease Sister   . Heart disease Brother   . Lung cancer Mother   . Cancer Mother   . Diabetes Mother   . Hypertension Mother   . Hyperlipidemia Mother   . Heart attack Maternal Grandmother   . Colon polyps Sister   . Heart attack Father   . Heart disease Father   . COPD Sister   . Diabetes Daughter      History   Social History  . Marital Status: Married    Spouse Name: [name]    Number of Children: 2  . Years of Education: [name]   Occupational History  . retired Solicitor     Social History Main Topics  . Smoking status: Former Smoker -- 1.50 packs/day for 40 years    Types: Cigarettes      Quit date: 12/11/2002  . Smokeless tobacco: Former Systems developer  . Alcohol Use: No  . Drug Use: No  . Sexual Activity: Not Currently   Other Topics Concern  . Not on file   Social History Narrative  . No narrative on file     BP 118/68  Pulse 64  Ht 5' 8.5" (1.74 m)  Wt 267 lb 6.4 oz (121.292 kg)  BMI 40.06 kg/m2  Physical Exam:  Well appearing obese man, NAD HEENT: Unremarkable Neck:  No JVD, no thyromegally Back:  No CVA tenderness Lungs:  Clear with no wheezes, rales, or rhonchi. HEART:  Regular rate rhythm, no murmurs, no rubs, no clicks Abd:  soft, obese, positive bowel sounds, no organomegally, no rebound, no guarding Ext:  2 plus pulses, 1+ peripheral edema, no cyanosis, no clubbing Skin:  No rashes no nodules Neuro:  CN II through XII intact, motor grossly intact  EKG - normal sinus rhythm with left bundle branch block  DEVICE  Normal device function.  See PaceArt for details.   Assess/Plan:

## 2014-10-09 NOTE — Progress Notes (Signed)
Received pt from cath lab in no acute distress. Asleep, arousable. Alert, some confusion noted. No acute distress. Bandage in place at l chest no bleeding, no obvious hematoma.

## 2014-10-09 NOTE — Progress Notes (Signed)
Transported per bed to 3E 04 in no acute distress. IV site removed from LUE, bandage to l chest intact, no bleeding, no s/s hematoma. Notified floor of pt departure.

## 2014-10-09 NOTE — CV Procedure (Addendum)
SURGEON:  Cristopher Peru, MD      PREPROCEDURE DIAGNOSES:   1. Ischemic cardiomyopathy.   2. New York Heart Association class III, heart failure chronically.   3. Left bundle-branch block.      POSTPROCEDURE DIAGNOSES:   1. Ischemic cardiomyopathy.   2. New York Heart Association class III heart failure chronically.   3. Left bundle-branch block.      PROCEDURES:    1. Biventricular ICD implantation with removal of the previously implant single chamber ICD  2. Defibrillation threshold testing 3. Venography of the coronary sinus     INTRODUCTION:  [name] is a 68 y.o. male with a ischemic CM (EF 25%), NYHA Class III CHF, VT, and LBBB QRS morophology. At this time, he meets MADIT I indication for and ICD and has undergone insertion previously.  Given LBBB, the patient may also be expected to benefit from resynchronization therapy. The patient has been treated with an optimal medical regimen but continues to have a depressed ejection fraction and NYHA Class III CHF symptoms.  he therefore  presents today for a biventricular ICD implantation.      DESCRIPTION OF PROCEDURE:  Informed written consent was obtained and the   patient was brought to the electrophysiology lab in the fasting state. The patient was adequately sedated with intravenous Versed and Fentanyl as outlined in the nursing report.  The patient's left chest was prepped and draped in the usual sterile fashion by the EP lab staff.  The skin overlying the left deltopectoral region was infiltrated with lidocaine for local analgesia.  A 6-cm incision was made over the left deltopectoral region.  A left subcutaneous defibrillator pocket was fashioned using a combination of sharp and blunt dissection. Care was taken not to enter the previously made ICD pocket.  Electrocautery was used to assure hemostasis.   RA/RV Lead Placement: The left axillary vein was cannulated with fluoroscopic visualization.   Through the left axillary vein,  a [employer] Sci Dextrus right atrial lead (serial number [id number]) was advanced with fluoroscopic visualization into the right atrial appendage.   Initial atrial lead P-waves measured 2.1 mV with an impedance of 521 ohms and a threshold of 0.9 volts at 0.5 milliseconds. The previously implanted ICD lead was evaluated and found to be working normally.  LV Lead Placement:  A Medtronic guide was advanced through the left axillary vein into the low lateral right atrium. A 6 french hexapolar EP catheter was introduced through the Medtronic guide and used to cannulate the coronary sinus. A coronary sinus nonselective venogram was performed by hand injection of nonionic contrast. This demonstrated a moderate high lateral vein and a large posterior vein. Initial placement of the quadrapolar lead in the posterior vein resulted in diaphragmatic stimulation and an elevated pacing threshold.  A 0.014 angioplasty guide wire was introduced through the medtronic Guide and advanced into the High lateral vein. A St. Jude Quartet(serial number [id number]) lead was advanced through the Medtronic guiding catheter into the distal lateral branch. This was approximately one-half from the base to the apex in a very lateral  position. In this location with St. Jude 1-2,  bipolar configuration, the left ventricular lead R-waves measured 24 mV with impedance of 1500 ohms and a threshold of 0.9 volt at 0.5 Milliseconds with no diaphragmatic stimulation observed when pacing at 10 volts output. The Medtronic guide was therefore removed.  All three leads were secured to the pectoralis fascia using #2 silk suture over the suture sleeves.  The pocket then irrigated with copious gentamicin solution.   Device Placement: The leads were then  connected to a [employer] (serial  Number [id number]) biventricular ICD.  The defibrillator was placed into the  pocket.  The pocket was then closed in 2 layers with 2.0 Vicryl suture  for the  subcutaneous and subcuticular layers.  Steri-Strips and a  sterile dressing were then applied.   DFT Testing: Defibrillation Threshold testing was then performed. Ventricular fibrillation was induced with a T shock.  Adequate sensing of ventricular  fibrillation was observed with minimal dropout with a programmed sensitivity of 1.70mV.  The patient was successfully defibrillated to sinus rhythm with a single 26 joules shock delivered from the device with an impedance of 38 ohms in a duration of 5.0 seconds.  The patient remained in sinus rhythm thereafter.  There were no early apparent complications.      CONCLUSIONS:   1. ischemic cardiomyopathy with Left bundle-branch block and chronic New York Heart Association class III heart failure.   2. Successful biventricular ICD implantation with insertion of a new RA lead and LV lead to the previously implanted ICD lead.   3. DFT less than or equal to 26 joules.   4.No early apparent complications.   Cristopher Peru, MD  2:41 PM 10/09/2014

## 2014-10-09 NOTE — Discharge Summary (Signed)
ELECTROPHYSIOLOGY PROCEDURE DISCHARGE SUMMARY    Patient ID: [name],  MRN: [id number], DOB/AGE: 07/06/1946 68 y.o.  Admit date: 10/09/2014 Discharge date: 10/10/2014  Primary Care Physician: [name], MD Primary Cardiologist: [place] Heart Failure: Aundra Dubin Electrophysiologist: Lovena Le  Primary Discharge Diagnosis:  Ischemic cardiomyopathy, heart failure, LBBB status post CRTD upgrade this admission  Secondary Discharge Diagnosis:  1.  Obesity 2.  CAD s/p CABG 3.  Sleep apnea - on CPAP 4.  Prior AAA repair 5.  COPD 6.  Hyperlipidemia  Allergies  Allergen Reactions  . Tussionex Pennkinetic Er [Hydrocod Polst-Cpm Polst Er] Other (See Comments)    "caused prostate problems"  . Ace Inhibitors Other (See Comments)    REACTION: cough  . Penicillins Other (See Comments)    "childhood allergy"     Procedures This Admission:  1.  Upgrade of previously implanted single chamber ICD to CRTD on 10-09-14 by Dr Lovena Le.  The patient received a BSX model number [employer] CRTD with model number 8416 right atrial lead and 6063K left ventricular lead.  The previously implanted 0148 right ventricular lead. DFT's were successful at 53 J.  There were no immediate post procedure complications. 2.  CXR on 10-10-14 demonstrated no pneumothorax status post device implantation.   Brief HPI: [name] is a 68 y.o. male was referred to electrophysiology in the outpatient setting for consideration of CRTD upgrade.  Past medical history includes ischemic cardiomyopathy with previously implanted single chamber ICD.  He has had progressive heart failure symptoms and a LBBB.    Risks, benefits, and alternatives to CRTD upgrade were reviewed with the patient who wished to proceed.   Hospital Course:  The patient was admitted and underwent CRTD upgrade with details as outlined above.   He was monitored on telemetry overnight which demonstrated sinus rhythm with ventricular pacing.  Left  chest was without hematoma or ecchymosis.  The device was interrogated and found to be functioning normally.  CXR was obtained and demonstrated no pneumothorax status post device implantation.  Wound care, arm mobility, and restrictions were reviewed with the patient.  Dr Lovena Le examined the patient and considered them stable for discharge to home.   The patient's discharge medications include an ARB (Valsartan) and beta blocker (Carvedilol).   Discharge Vitals: Blood pressure 116/45, pulse 61, temperature 98 F (36.7 C), temperature source Oral, resp. rate 18, height 5\' 8"  (1.727 m), weight 271 lb 9.7 oz (123.2 kg), SpO2 97.00%.   Labs:   Lab Results  Component Value Date   WBC 5.4 10/05/2014   HGB 10.9* 10/05/2014   HCT 34.8* 10/05/2014   MCV 86.6 10/05/2014   PLT 150.0 10/05/2014     Recent Labs Lab 10/05/14 0856  NA 144  K 4.3  CL 105  CO2 25  BUN 18  CREATININE 0.9  CALCIUM 8.8  GLUCOSE 136*     Discharge Medications:    Medication List    ASK your doctor about these medications       albuterol 108 (90 BASE) MCG/ACT inhaler  Commonly known as:  PROVENTIL HFA;VENTOLIN HFA  Inhale 2 puffs into the lungs every 6 (six) hours as needed for wheezing or shortness of breath.     albuterol (2.5 MG/3ML) 0.083% nebulizer solution  Commonly known as:  PROVENTIL  Take 3 mLs (2.5 mg total) by nebulization every 6 (six) hours as needed for wheezing or shortness of breath.     aspirin 81 MG tablet  Take 81  mg by mouth daily.     atorvastatin 40 MG tablet  Commonly known as:  LIPITOR  Take 1 tablet (40 mg total) by mouth daily.     carvedilol 25 MG tablet  Commonly known as:  COREG  Take 1 tablet (25 mg total) by mouth 2 (two) times daily with a meal.     clopidogrel 75 MG tablet  Commonly known as:  PLAVIX  Take 1 tablet (75 mg total) by mouth daily.     CORDARONE 200 MG tablet  Generic drug:  amiodarone  Take 200 mg by mouth daily.     finasteride 5 MG tablet    Commonly known as:  PROSCAR  Take 5 mg by mouth daily.     furosemide 80 MG tablet  Commonly known as:  LASIX  Take 40-80 mg by mouth 2 (two) times daily. Take 80 mg by mouth in the morning and take 40 mg by mouth in the evening.     levothyroxine 50 MCG tablet  Commonly known as:  SYNTHROID, LEVOTHROID  Take 50 mcg by mouth daily before breakfast.     multivitamin-lutein Caps capsule  Take 1 capsule by mouth daily.     MENS 50+ MULTI VITAMIN/MIN Tabs  Take 1 tablet by mouth daily.     NITROSTAT 0.4 MG SL tablet  Generic drug:  nitroGLYCERIN  Dissolve 1 tablet under the tongue every 5 minutes as   needed for chest pain (up   to 3 tabs in 15 min then   call 911)     NON FORMULARY  - at bedtime. CPAP  - And during the day as needed     Sacubitril-Valsartan 24-26 MG Tabs  Commonly known as:  ENTRESTO  Take 24-26 mg by mouth 2 (two) times daily.     salmeterol 50 MCG/DOSE diskus inhaler  Commonly known as:  SEREVENT  Inhale 2 puffs into the lungs daily as needed (for shortness of breath).        Disposition:     Duration of Discharge Encounter: less than 30 minutes including physician time.  Signed,  Mikle Bosworth.D.

## 2014-10-09 NOTE — Interval H&P Note (Signed)
History and Physical Interval Note:  10/09/2014 12:08 PM  [name]  has presented today for surgery, with the diagnosis of chf  The various methods of treatment have been discussed with the patient and family. After consideration of risks, benefits and other options for treatment, the patient has consented to  Procedure(s): BI-VENTRICULAR IMPLANTABLE CARDIOVERTER DEFIBRILLATOR UPGRADE ([name]) as a surgical intervention .  The patient's history has been reviewed, patient examined, no change in status, stable for surgery.  I have reviewed the patient's chart and labs.  Questions were answered to the patient's satisfaction.     Mikle Bosworth.D.

## 2014-10-09 NOTE — H&P (Signed)
  ICD Criteria  Current LVEF:25% ;Obtained > 3 months ago and < or = 6 months ago.   NYHA Functional Classification: Class III  Heart Failure History:  Yes, Duration of heart failure since onset is > 9 months  Non-Ischemic Dilated Cardiomyopathy History:  No.  Atrial Fibrillation/Atrial Flutter:  No.  Ventricular Tachycardia History:  Yes, Hemodynamic instability present, VT Type:  SVT - Monomorphic.  Cardiac Arrest History:  No  History of Syndromes with Risk of Sudden Death:  No.  Previous ICD:  Yes, ICD Type:  Single, Reason for ICD:  Primary prevention.  25%  Electrophysiology Study: No.  Prior MI: Yes, Most recent MI timeframe is > 40 days.  PPM: No.  OSA:  Yes  Patient Life Expectancy of >=1 year: Yes.  Anticoagulation Therapy:  Patient is NOT on anticoagulation therapy.   Beta Blocker Therapy:  Yes.   Ace Inhibitor/ARB Therapy:  No, Reason not on Ace Inhibitor/ARB therapy:  cough

## 2014-10-10 ENCOUNTER — Ambulatory Visit (HOSPITAL_COMMUNITY): Payer: Medicare Other

## 2014-10-10 DIAGNOSIS — I255 Ischemic cardiomyopathy: Secondary | ICD-10-CM

## 2014-10-10 DIAGNOSIS — I5022 Chronic systolic (congestive) heart failure: Secondary | ICD-10-CM | POA: Diagnosis not present

## 2014-10-10 DIAGNOSIS — Z4502 Encounter for adjustment and management of automatic implantable cardiac defibrillator: Secondary | ICD-10-CM | POA: Diagnosis not present

## 2014-10-10 NOTE — Discharge Instructions (Signed)
° ° °  Supplemental Discharge Instructions for  Pacemaker/Defibrillator Patients  Activity No heavy lifting or vigorous activity with your left/right arm for 6 to 8 weeks.  Do not raise your left/right arm above your head for one week.  Gradually raise your affected arm as drawn below.           10/12/14                            10/13/14                    10/14/14                  10/15/14  NO DRIVING for  1 week   ; you may begin driving on   48/5/46 (unless previously instructed by your doctor not to drive)  .  WOUND CARE   Keep the wound area clean and dry.  Do not get this area wet for one week. No showers for one week; you may shower on  10/17/14   .   The tape/steri-strips on your wound will fall off; do not pull them off.  No bandage is needed on the site.  DO  NOT apply any creams, oils, or ointments to the wound area.   If you notice any drainage or discharge from the wound, any swelling or bruising at the site, or you develop a fever > 101? F after you are discharged home, call the office at once.  Special Instructions   You are still able to use cellular telephones; use the ear opposite the side where you have your pacemaker/defibrillator.  Avoid carrying your cellular phone near your device.   When traveling through airports, show security personnel your identification card to avoid being screened in the metal detectors.  Ask the security personnel to use the hand wand.   Avoid arc welding equipment, MRI testing (magnetic resonance imaging), TENS units (transcutaneous nerve stimulators).  Call the office for questions about other devices.   Avoid electrical appliances that are in poor condition or are not properly grounded.   Microwave ovens are safe to be near or to operate.  Additional information for defibrillator patients should your device go off:   If your device goes off ONCE and you feel fine afterward, notify the device clinic nurses.   If your device goes off ONCE and you  do not feel well afterward, call 911.   If your device goes off TWICE, call 911.   If your device goes off THREE times in one day, call 911.  DO NOT DRIVE YOURSELF OR A FAMILY MEMBER WITH A DEFIBRILLATOR TO THE HOSPITAL--CALL 911.

## 2014-10-13 ENCOUNTER — Ambulatory Visit (HOSPITAL_COMMUNITY)
Admission: RE | Admit: 2014-10-13 | Discharge: 2014-10-13 | Disposition: A | Discharge status: Home / Self Care | Payer: Medicare Other | Source: Ambulatory Visit | Attending: Internal Medicine | Admitting: Internal Medicine

## 2014-10-13 DIAGNOSIS — I10 Essential (primary) hypertension: Secondary | ICD-10-CM | POA: Insufficient documentation

## 2014-10-13 DIAGNOSIS — I5022 Chronic systolic (congestive) heart failure: Secondary | ICD-10-CM | POA: Insufficient documentation

## 2014-10-13 LAB — BASIC METABOLIC PANEL
Anion gap: 12 (ref 5–15)
BUN: 14 mg/dL (ref 6–23)
CO2: 29 mEq/L (ref 19–32)
Calcium: 9.1 mg/dL (ref 8.4–10.5)
Chloride: 101 mEq/L (ref 96–112)
Creatinine, Ser: 0.83 mg/dL (ref 0.50–1.35)
GFR calc Af Amer: >90 mL/min (ref >90)
GFR calc non Af Amer: 88 mL/min — ABNORMAL LOW (ref >90)
Glucose, Bld: 112 mg/dL — ABNORMAL HIGH (ref 70–99)
Potassium: 3.8 mEq/L (ref 3.7–5.3)
Sodium: 142 mEq/L (ref 137–147)

## 2014-10-13 LAB — PRO B NATRIURETIC PEPTIDE: Pro B Natriuretic peptide (BNP): 318.7 pg/mL — ABNORMAL HIGH (ref 0–125)

## 2014-10-21 ENCOUNTER — Ambulatory Visit (INDEPENDENT_AMBULATORY_CARE_PROVIDER_SITE_OTHER): Payer: Medicare Other | Admitting: *Deleted

## 2014-10-21 ENCOUNTER — Encounter: Payer: Self-pay | Admitting: Internal Medicine

## 2014-10-21 ENCOUNTER — Other Ambulatory Visit: Payer: Self-pay | Admitting: Internal Medicine

## 2014-10-21 DIAGNOSIS — I255 Ischemic cardiomyopathy: Secondary | ICD-10-CM

## 2014-10-21 DIAGNOSIS — I5043 Acute on chronic combined systolic (congestive) and diastolic (congestive) heart failure: Secondary | ICD-10-CM | POA: Diagnosis not present

## 2014-10-21 LAB — MDC_IDC_ENUM_SESS_TYPE_INCLINIC
Date Time Interrogation Session: 20151111050000
HighPow Impedance: 38 Ohm
HighPow Impedance: 42 Ohm
Implantable Pulse Generator Serial Number: 370874
Lead Channel Impedance Value: 474 Ohm
Lead Channel Impedance Value: 541 Ohm
Lead Channel Impedance Value: 634 Ohm
Lead Channel Pacing Threshold Amplitude: 0.7 V
Lead Channel Pacing Threshold Amplitude: 0.8 V
Lead Channel Pacing Threshold Amplitude: 3.5 V
Lead Channel Pacing Threshold Pulse Width: 0.3 ms
Lead Channel Pacing Threshold Pulse Width: 0.4 ms
Lead Channel Pacing Threshold Pulse Width: 0.4 ms
Lead Channel Sensing Intrinsic Amplitude: 11.2 mV
Lead Channel Sensing Intrinsic Amplitude: 2.1 mV
Lead Channel Sensing Intrinsic Amplitude: 25 mV
Lead Channel Setting Pacing Amplitude: 3.5 V
Lead Channel Setting Pacing Amplitude: 3.5 V
Lead Channel Setting Pacing Amplitude: 3.5 V
Lead Channel Setting Pacing Pulse Width: 0.4 ms
Lead Channel Setting Pacing Pulse Width: 0.9
Lead Channel Setting Sensing Sensitivity: 0.6 mV
Lead Channel Setting Sensing Sensitivity: 1 mV
Zone Setting Detection Interval: 273 ms
Zone Setting Detection Interval: 316 ms
Zone Setting Detection Interval: 375 ms

## 2014-10-21 NOTE — Progress Notes (Signed)

## 2014-10-22 ENCOUNTER — Ambulatory Visit (HOSPITAL_COMMUNITY)
Admission: RE | Admit: 2014-10-22 | Discharge: 2014-10-22 | Disposition: A | Discharge status: Home / Self Care | Payer: Medicare Other | Source: Ambulatory Visit | Attending: Cardiology | Admitting: Cardiology

## 2014-10-22 ENCOUNTER — Encounter (HOSPITAL_COMMUNITY): Payer: Self-pay

## 2014-10-22 VITALS — BP 122/64 | HR 58 | Wt 266.8 lb

## 2014-10-22 DIAGNOSIS — I472 Ventricular tachycardia, unspecified: Secondary | ICD-10-CM

## 2014-10-22 DIAGNOSIS — Z87891 Personal history of nicotine dependence: Secondary | ICD-10-CM | POA: Insufficient documentation

## 2014-10-22 DIAGNOSIS — E119 Type 2 diabetes mellitus without complications: Secondary | ICD-10-CM | POA: Insufficient documentation

## 2014-10-22 DIAGNOSIS — I714 Abdominal aortic aneurysm, without rupture: Secondary | ICD-10-CM | POA: Insufficient documentation

## 2014-10-22 DIAGNOSIS — Z951 Presence of aortocoronary bypass graft: Secondary | ICD-10-CM | POA: Diagnosis not present

## 2014-10-22 DIAGNOSIS — I2510 Atherosclerotic heart disease of native coronary artery without angina pectoris: Secondary | ICD-10-CM | POA: Insufficient documentation

## 2014-10-22 DIAGNOSIS — K219 Gastro-esophageal reflux disease without esophagitis: Secondary | ICD-10-CM | POA: Diagnosis not present

## 2014-10-22 DIAGNOSIS — Z7902 Long term (current) use of antithrombotics/antiplatelets: Secondary | ICD-10-CM | POA: Insufficient documentation

## 2014-10-22 DIAGNOSIS — I739 Peripheral vascular disease, unspecified: Secondary | ICD-10-CM | POA: Insufficient documentation

## 2014-10-22 DIAGNOSIS — I5042 Chronic combined systolic (congestive) and diastolic (congestive) heart failure: Secondary | ICD-10-CM | POA: Diagnosis not present

## 2014-10-22 DIAGNOSIS — I5022 Chronic systolic (congestive) heart failure: Secondary | ICD-10-CM | POA: Insufficient documentation

## 2014-10-22 DIAGNOSIS — Z79899 Other long term (current) drug therapy: Secondary | ICD-10-CM | POA: Diagnosis not present

## 2014-10-22 DIAGNOSIS — Z95810 Presence of automatic (implantable) cardiac defibrillator: Secondary | ICD-10-CM | POA: Insufficient documentation

## 2014-10-22 DIAGNOSIS — I255 Ischemic cardiomyopathy: Secondary | ICD-10-CM | POA: Diagnosis present

## 2014-10-22 DIAGNOSIS — J449 Chronic obstructive pulmonary disease, unspecified: Secondary | ICD-10-CM | POA: Insufficient documentation

## 2014-10-22 DIAGNOSIS — I10 Essential (primary) hypertension: Secondary | ICD-10-CM | POA: Diagnosis not present

## 2014-10-22 DIAGNOSIS — I252 Old myocardial infarction: Secondary | ICD-10-CM | POA: Diagnosis not present

## 2014-10-22 DIAGNOSIS — E785 Hyperlipidemia, unspecified: Secondary | ICD-10-CM | POA: Insufficient documentation

## 2014-10-22 DIAGNOSIS — E039 Hypothyroidism, unspecified: Secondary | ICD-10-CM | POA: Diagnosis not present

## 2014-10-22 LAB — LIPID PANEL
Cholesterol: 122 mg/dL (ref 0–200)
HDL: 40 mg/dL (ref >39)
LDL Cholesterol: 61 mg/dL (ref 0–99)
Total CHOL/HDL Ratio: 3.1 RATIO
Triglycerides: 105 mg/dL (ref <150)
VLDL: 21 mg/dL (ref 0–40)

## 2014-10-22 LAB — HEPATIC FUNCTION PANEL
ALT: 17 U/L (ref 0–53)
AST: 17 U/L (ref 0–37)
Albumin: 3.6 g/dL (ref 3.5–5.2)
Alkaline Phosphatase: 87 U/L (ref 39–117)
Bilirubin, Direct: <0.2 mg/dL (ref 0.0–0.3)
Total Bilirubin: 0.5 mg/dL (ref 0.3–1.2)
Total Protein: 6.9 g/dL (ref 6.0–8.3)

## 2014-10-22 LAB — TSH: TSH: 3.82 u[IU]/mL (ref 0.350–4.500)

## 2014-10-22 MED ORDER — SACUBITRIL-VALSARTAN 24-26 MG PO TABS: 24.0000 mg | ORAL_TABLET | Freq: Two times a day (BID) | ORAL | Status: DC

## 2014-10-22 MED ORDER — FUROSEMIDE 40 MG PO TABS: 60.0000 mg | ORAL_TABLET | Freq: Two times a day (BID) | ORAL | Status: DC

## 2014-10-22 NOTE — Telephone Encounter (Signed)
Patient is not having chest pain. Just wanted to make sure they would have refills if needed. Rx refill sent to patient pharmacy. Advised patient to call if he is having chest pain.

## 2014-10-22 NOTE — Patient Instructions (Addendum)
Start Entresto 24/26 mg Twice daily, please active 30 day free trial card before taking to pharmacy, if you have any issues give [place] a call  Change Furosemide to 60 mg (1 & 1/2 tabs) Twice daily   Labs today  Labs in about 10 days (bmet)  Your physician recommends that you schedule a follow-up appointment in: 1 month

## 2014-10-22 NOTE — Progress Notes (Signed)
Patient ID: [name], male   DOB: Jan 20, 1946, 68 y.o.   MRN: [id number]  [facility]  10/22/2014 [name]   March 16, 1946  [id number]  Primary Physicia [name], MD Hematologist: Dr Alen Blew  Pulmonary: Dr Annamaria Boots  HPI:  [name] is a pleasant 68 yo with a past medical history significant for morbid obesity, systolic heart failure with an EF of 30-35% (January 2014) -> 20-25% (March 2015 - RV normal), AICD placement, VT on amio, CAD s/p CABG 1996,LBBB, COPD, obstructive sleep apnea on CPAP, AAA repair (January 2015) who was admitted to [facility] on 02/24/14 for acute on chronic HF and acute COPD exacerbation. He also had iron deficiency anemia.   Admitted to [facility] July 24 through July 06 2014 with chest pain.  CEs negative. Had a LHC with no change from previous LHC with recommendations to continue medical management. Discharge weight was 255 pounds.   LHC 07/06/14 --No significant change from previous studies.  Left anterior descending (LAD): The LAD is a large vessel. There is a 40% stenosis immediately after the takeoff of a large septal perforator. There are 2 large diagonal branches without significant disease.  Left circumflex (LCx): The LCx is occluded proximally. There are left to left collaterals.  Right coronary artery (RCA): The RCA is occluded proximally immediately following the conus branch. There are right to right and left to right collaterals.  SVGs from CABG known to be totally occluded.   At last appointment, I asked Mr [name] to increase Lasix to 80 qam/40 qpm and to start Entresto 24/26 bid.  He did not do either apparently due to a mix-up with his pharmacy.  He is still taking Lasix 40 mg bid.  He had his [place] upgraded to CRT-D since last visit.  He remains short of breath with walking briskly or walking up steps or an incline.  No orthopnea or PND.  No chest pain.    Labs 3/15 Cr 1.5 K 5.6 Labs 07/06/14 K 3.9 Creatinine  0.98 Labs 9/15 K  5.1, creatinine 0.96 Labs 11/15 K 3.8, creatinine 0.83  Past Medical History  Diagnosis Date  . Myocardial infarction   . Hypertension   . CHF (congestive heart failure)   . COPD (chronic obstructive pulmonary disease)   . GERD (gastroesophageal reflux disease)   . AAA (abdominal aortic aneurysm)     followed by Dr. Bridgett Larsson  . Tremor   . Dysrhythmia     ICD-defibrillator  . Carotid artery stenosis     LCEA - Dr. Bridgett Larsson in 2013  . Peripheral vascular disease     LCEA, L renal artery stent, bilat iliac stents, R SFA stenosis  . Adenomatous colon polyp 01/2004  . Diverticulosis   . AVM (arteriovenous malformation)   . CAD (coronary artery disease)     s/p CABGx2 in 1996  . HLD (hyperlipidemia)   . Hypothyroidism   . BPH (benign prostatic hypertrophy)   . Fatigue   . Automatic implantable cardioverter-defibrillator in situ   . OSA on CPAP     AHI durign total sleep 14.69/hr, during REM 50.91/hr  . H/O hiatal hernia   . Anemia     hx  . Complication of anesthesia     claustrophobic, unabe to lie on back more than 4 hours at time due to back  . Diabetes mellitus     DIET CONTROLLED  . Ischemic cardiomyopathy   . Ventricular tachycardia 09/09/2014    Amiodarone was  started after appropriate defibrillator shocks for ventricular tachycardia in October 2008    Current Outpatient Prescriptions  Medication Sig Dispense Refill  . albuterol (PROVENTIL HFA;VENTOLIN HFA) 108 (90 BASE) MCG/ACT inhaler Inhale 2 puffs into the lungs every 6 (six) hours as needed for wheezing or shortness of breath. 1 Inhaler 3  . albuterol (PROVENTIL) (2.5 MG/3ML) 0.083% nebulizer solution Take 3 mLs (2.5 mg total) by nebulization every 6 (six) hours as needed for wheezing or shortness of breath. 75 mL 12  . amiodarone (CORDARONE) 200 MG tablet Take 200 mg by mouth daily.     Marland Kitchen aspirin 81 MG tablet Take 81 mg by mouth daily.    Marland Kitchen atorvastatin (LIPITOR) 40 MG tablet Take 1 tablet (40 mg total) by mouth daily.  90 tablet 3  . carvedilol (COREG) 25 MG tablet Take 1 tablet (25 mg total) by mouth 2 (two) times daily with a meal. 180 tablet 3  . clopidogrel (PLAVIX) 75 MG tablet Take 1 tablet (75 mg total) by mouth daily. 90 tablet 3  . finasteride (PROSCAR) 5 MG tablet Take 5 mg by mouth daily.    . furosemide (LASIX) 40 MG tablet Take 1.5 tablets (60 mg total) by mouth 2 (two) times daily. 90 tablet 3  . levothyroxine (SYNTHROID, LEVOTHROID) 50 MCG tablet Take 50 mcg by mouth daily before breakfast.    . Multiple Vitamins-Minerals (MENS 50+ MULTI VITAMIN/MIN) TABS Take 1 tablet by mouth daily.     . multivitamin-lutein (OCUVITE-LUTEIN) CAPS Take 1 capsule by mouth daily.      . NON FORMULARY at bedtime. CPAP And during the day as needed    . Sacubitril-Valsartan (ENTRESTO) 24-26 MG TABS Take 24-26 mg by mouth 2 (two) times daily. 60 tablet 3  . salmeterol (SEREVENT) 50 MCG/DOSE diskus inhaler Inhale 2 puffs into the lungs daily as needed (for shortness of breath).    Marland Kitchen NITROSTAT 0.4 MG SL tablet Dissolve 1 tablet under the tongue every 5 minutes as  needed for chest pain (up  to 3 tabs in 15 minutes  then call 911) 25 tablet 0   No current facility-administered medications for this encounter.    Allergies  Allergen Reactions  . Tussionex Pennkinetic Er [Hydrocod Polst-Cpm Polst Er] Other (See Comments)    "caused prostate problems"  . Ace Inhibitors Other (See Comments)    REACTION: cough  . Penicillins Other (See Comments)    "childhood allergy"    History   Social History  . Marital Status: Married    Spouse Name: [name]    Number of Children: 2  . Years of Education: [name]   Occupational History  . retired Solicitor     Social History Main Topics  . Smoking status: Former Smoker -- 1.50 packs/day for 40 years    Types: Cigarettes    Quit date: 12/11/2002  . Smokeless tobacco: Former Systems developer  . Alcohol Use: No  . Drug Use: No  . Sexual Activity: Not Currently   Other  Topics Concern  . Not on file   Social History Narrative    Blood pressure 122/64, pulse 58, weight 266 lb 12.8 oz (121.02 kg), SpO2 94 %.  General appearance: alert, cooperative and no distress Wife present.  Neck: no carotid bruit and JVP 8-9 cm. L CEA scar Lungs: Decreased breath sounds throughout lung fields, rhonchi noted.  Heart: regular rate and rhythm, S1, S2 normal, 2/6 SEM at RUSB.  No S3/S4.  Extremities: 1+ edema  1/2 to knees bilaterally Skin: warm and dry Neurologic: Grossly normal   ASSESSMENT/PLAN:  1. Chronicsystolic CHF: Ischemic cardiomyopathy. ECHO 02/2014 EF 20-25%.  [employer] CRT-D. NYHA III symptoms, stable. Likely combination of CHF and moderate COPD. He has some excess volume on exam today still.  He did not increase Lasix or start Entresto after last appointment.  - Increase Lasix to 60 mg bid.  - Continue carvedilol 25 mg twice a day  - Start Entresto 24/26 bid today with BMET in 10 days. If he has trouble getting coverage for Entresto, would use losartan 25 mg daily.    2. CAD s/p CABG: No ischemic symptoms. Continue 81 mg aspirin daily and atorvastatin daily.  Check lipids today.  3. VT s/p ICD: On amiodarone for history of ICD discharges due to VT.  Patient will need LFTs/TSH checked today.  He should get yearly eye exams.  4. Morbid obesity: Needs to work on weight loss.  5. COPD: Moderate to severe.  I suspect that this contributes to his dyspnea.   [name] 10/22/2014

## 2014-10-26 ENCOUNTER — Encounter (HOSPITAL_COMMUNITY): Payer: Self-pay | Admitting: *Deleted

## 2014-10-27 ENCOUNTER — Other Ambulatory Visit: Payer: Self-pay

## 2014-10-27 ENCOUNTER — Ambulatory Visit (INDEPENDENT_AMBULATORY_CARE_PROVIDER_SITE_OTHER): Payer: Medicare Other | Admitting: Ophthalmology

## 2014-10-29 ENCOUNTER — Other Ambulatory Visit: Payer: Self-pay

## 2014-10-29 MED ORDER — AMIODARONE HCL 200 MG PO TABS: 200.0000 mg | ORAL_TABLET | Freq: Every day | ORAL | Status: DC

## 2014-10-29 MED ORDER — ISOSORBIDE MONONITRATE ER 60 MG PO TB24: 60.0000 mg | ORAL_TABLET | Freq: Every day | ORAL | Status: DC

## 2014-10-29 MED ORDER — CARVEDILOL 25 MG PO TABS: 25.0000 mg | ORAL_TABLET | Freq: Two times a day (BID) | ORAL | Status: DC

## 2014-10-29 MED ORDER — ATORVASTATIN CALCIUM 40 MG PO TABS: 40.0000 mg | ORAL_TABLET | Freq: Every day | ORAL | Status: DC

## 2014-10-29 NOTE — Telephone Encounter (Signed)
Rx sent to pharmacy   

## 2014-11-03 ENCOUNTER — Ambulatory Visit (HOSPITAL_COMMUNITY)
Admission: RE | Admit: 2014-11-03 | Discharge: 2014-11-03 | Disposition: A | Discharge status: Home / Self Care | Payer: Medicare Other | Source: Ambulatory Visit | Attending: Cardiology | Admitting: Cardiology

## 2014-11-03 DIAGNOSIS — I5022 Chronic systolic (congestive) heart failure: Secondary | ICD-10-CM | POA: Diagnosis not present

## 2014-11-19 ENCOUNTER — Encounter (HOSPITAL_COMMUNITY): Payer: Self-pay | Admitting: Cardiology

## 2014-11-25 ENCOUNTER — Other Ambulatory Visit: Payer: Self-pay

## 2014-11-26 ENCOUNTER — Ambulatory Visit (HOSPITAL_COMMUNITY)
Admission: RE | Admit: 2014-11-26 | Discharge: 2014-11-26 | Disposition: A | Discharge status: Home / Self Care | Payer: Medicare Other | Source: Ambulatory Visit | Attending: Internal Medicine | Admitting: Internal Medicine

## 2014-11-26 ENCOUNTER — Encounter (HOSPITAL_COMMUNITY): Payer: Self-pay

## 2014-11-26 VITALS — BP 96/48 | HR 67 | Wt 279.5 lb

## 2014-11-26 DIAGNOSIS — I2510 Atherosclerotic heart disease of native coronary artery without angina pectoris: Secondary | ICD-10-CM | POA: Insufficient documentation

## 2014-11-26 DIAGNOSIS — Z7982 Long term (current) use of aspirin: Secondary | ICD-10-CM | POA: Insufficient documentation

## 2014-11-26 DIAGNOSIS — Z79899 Other long term (current) drug therapy: Secondary | ICD-10-CM | POA: Diagnosis not present

## 2014-11-26 DIAGNOSIS — J449 Chronic obstructive pulmonary disease, unspecified: Secondary | ICD-10-CM | POA: Insufficient documentation

## 2014-11-26 DIAGNOSIS — Z95810 Presence of automatic (implantable) cardiac defibrillator: Secondary | ICD-10-CM | POA: Diagnosis not present

## 2014-11-26 DIAGNOSIS — I509 Heart failure, unspecified: Secondary | ICD-10-CM | POA: Insufficient documentation

## 2014-11-26 DIAGNOSIS — I5022 Chronic systolic (congestive) heart failure: Secondary | ICD-10-CM | POA: Diagnosis not present

## 2014-11-26 DIAGNOSIS — Z87891 Personal history of nicotine dependence: Secondary | ICD-10-CM | POA: Insufficient documentation

## 2014-11-26 DIAGNOSIS — Z951 Presence of aortocoronary bypass graft: Secondary | ICD-10-CM | POA: Diagnosis not present

## 2014-11-26 DIAGNOSIS — I255 Ischemic cardiomyopathy: Secondary | ICD-10-CM | POA: Diagnosis not present

## 2014-11-26 DIAGNOSIS — D509 Iron deficiency anemia, unspecified: Secondary | ICD-10-CM | POA: Insufficient documentation

## 2014-11-26 LAB — BASIC METABOLIC PANEL
Anion gap: 12 (ref 5–15)
BUN: 17 mg/dL (ref 6–23)
CO2: 24 mEq/L (ref 19–32)
Calcium: 9.1 mg/dL (ref 8.4–10.5)
Chloride: 104 mEq/L (ref 96–112)
Creatinine, Ser: 0.73 mg/dL (ref 0.50–1.35)
GFR calc Af Amer: >90 mL/min (ref >90)
GFR calc non Af Amer: >90 mL/min (ref >90)
Glucose, Bld: 105 mg/dL — ABNORMAL HIGH (ref 70–99)
Potassium: 5 mEq/L (ref 3.7–5.3)
Sodium: 140 mEq/L (ref 137–147)

## 2014-11-26 LAB — PRO B NATRIURETIC PEPTIDE: Pro B Natriuretic peptide (BNP): 195.2 pg/mL — ABNORMAL HIGH (ref 0–125)

## 2014-11-26 MED ORDER — TORSEMIDE 20 MG PO TABS: 40.0000 mg | ORAL_TABLET | Freq: Two times a day (BID) | ORAL | Status: DC

## 2014-11-26 MED ORDER — ISOSORBIDE MONONITRATE ER 60 MG PO TB24: 60.0000 mg | ORAL_TABLET | Freq: Every day | ORAL | Status: DC

## 2014-11-26 NOTE — Patient Instructions (Signed)
Stop Furosemide (Lasix)  Start Torsemide (Demadex) 40 mg (2 tabs) Twice daily   Take Isosorbide 60 mg daily  Labs today  Labs in about 10 days (bmet, pro bnp)  Your physician recommends that you schedule a follow-up appointment in: 3 weeks

## 2014-11-27 NOTE — Progress Notes (Signed)
Patient ID: [name], male   DOB: [birth year], 68 y.o.   MRN: [id number]   [facility]  11/27/2014 CLIFF DAMIANI   July 16, 1946  [id number]  Primary Physicia [name], MD Hematologist: Dr Alen Blew  Pulmonary: Dr Annamaria Boots  HPI:  Mr. Schoolfield is a pleasant 68 yo with a past medical history significant for morbid obesity, systolic heart failure with an EF of 30-35% (January 2014) -> 20-25% (March 2015 - RV normal), AICD placement, VT on amio, CAD s/p CABG 1996,LBBB, COPD, obstructive sleep apnea on CPAP, AAA repair (January 2015) who was admitted to [facility] on 02/24/14 for acute on chronic HF and acute COPD exacerbation. He also had iron deficiency anemia.   Admitted to [facility] July 24 through July 06 2014 with chest pain.  CEs negative. Had a LHC with no change from previous LHC with recommendations to continue medical management. Discharge weight was 255 pounds.   LHC 07/06/14 --No significant change from previous studies.  Left anterior descending (LAD): The LAD is a large vessel. There is a 40% stenosis immediately after the takeoff of a large septal perforator. There are 2 large diagonal branches without significant disease.  Left circumflex (LCx): The LCx is occluded proximally. There are left to left collaterals.  Right coronary artery (RCA): The RCA is occluded proximally immediately following the conus branch. There are right to right and left to right collaterals.  SVGs from CABG known to be totally occluded.   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   Since last appointment, weight is up 13 lbs.  He has had significant dietary indiscretion.  Eating oyster stew twice a day for the last 3-4 days.  He is short of breath after walking about 50 yards.  No chest pain, no orthopnea/PND.  No lightheadedness or syncope.  He is taking Lasix 80 mg bid.   Labs 3/15 Cr 1.5 K 5.6 Labs 07/06/14 K 3.9 Creatinine  0.98 Labs 9/15 K 5.1, creatinine 0.96 Labs 11/15 K 3.8, creatinine 0.83,  LDL 61, LFTs normal, TSH normal  Past Medical History  Diagnosis Date  . Myocardial infarction   . Hypertension   . CHF (congestive heart failure)   . COPD (chronic obstructive pulmonary disease)   . GERD (gastroesophageal reflux disease)   . AAA (abdominal aortic aneurysm)     followed by Dr. Bridgett Larsson  . Tremor   . Dysrhythmia     ICD-defibrillator  . Carotid artery stenosis     LCEA - Dr. Bridgett Larsson in 2013  . Peripheral vascular disease     LCEA, L renal artery stent, bilat iliac stents, R SFA stenosis  . Adenomatous colon polyp 01/2004  . Diverticulosis   . AVM (arteriovenous malformation)   . CAD (coronary artery disease)     s/p CABGx2 in 1996  . HLD (hyperlipidemia)   . Hypothyroidism   . BPH (benign prostatic hypertrophy)   . Fatigue   . Automatic implantable cardioverter-defibrillator in situ   . OSA on CPAP     AHI durign total sleep 14.69/hr, during REM 50.91/hr  . H/O hiatal hernia   . Anemia     hx  . Complication of anesthesia     claustrophobic, unabe to lie on back more than 4 hours at time due to back  . Diabetes mellitus     DIET CONTROLLED  . Ischemic cardiomyopathy   . Ventricular tachycardia 09/09/2014    Amiodarone was started after appropriate defibrillator shocks for ventricular tachycardia in October  2008    Current Outpatient Prescriptions  Medication Sig Dispense Refill  . albuterol (PROVENTIL HFA;VENTOLIN HFA) 108 (90 BASE) MCG/ACT inhaler Inhale 2 puffs into the lungs every 6 (six) hours as needed for wheezing or shortness of breath. 1 Inhaler 3  . albuterol (PROVENTIL) (2.5 MG/3ML) 0.083% nebulizer solution Take 3 mLs (2.5 mg total) by nebulization every 6 (six) hours as needed for wheezing or shortness of breath. 75 mL 12  . amiodarone (CORDARONE) 200 MG tablet Take 1 tablet (200 mg total) by mouth daily. 90 tablet 3  . aspirin 81 MG tablet Take 81 mg by mouth daily.    Marland Kitchen atorvastatin (LIPITOR) 40 MG tablet Take 1 tablet (40 mg total) by mouth daily.  90 tablet 3  . carvedilol (COREG) 25 MG tablet Take 1 tablet (25 mg total) by mouth 2 (two) times daily with a meal. 180 tablet 3  . clopidogrel (PLAVIX) 75 MG tablet Take 1 tablet (75 mg total) by mouth daily. 90 tablet 3  . finasteride (PROSCAR) 5 MG tablet Take 5 mg by mouth daily.    . isosorbide mononitrate (IMDUR) 60 MG 24 hr tablet Take 1 tablet (60 mg total) by mouth daily. 60 tablet 4  . levothyroxine (SYNTHROID, LEVOTHROID) 50 MCG tablet Take 50 mcg by mouth daily before breakfast.    . Multiple Vitamins-Minerals (MENS 50+ MULTI VITAMIN/MIN) TABS Take 1 tablet by mouth daily.     . multivitamin-lutein (OCUVITE-LUTEIN) CAPS Take 1 capsule by mouth daily.      Marland Kitchen NITROSTAT 0.4 MG SL tablet Dissolve 1 tablet under the tongue every 5 minutes as  needed for chest pain (up  to 3 tabs in 15 minutes  then call 911) 25 tablet 0  . NON FORMULARY at bedtime. CPAP And during the day as needed    . Sacubitril-Valsartan (ENTRESTO) 24-26 MG TABS Take 24-26 mg by mouth 2 (two) times daily. 60 tablet 3  . salmeterol (SEREVENT) 50 MCG/DOSE diskus inhaler Inhale 2 puffs into the lungs daily as needed (for shortness of breath).    Marland Kitchen spironolactone (ALDACTONE) 25 MG tablet     . torsemide (DEMADEX) 20 MG tablet Take 2 tablets (40 mg total) by mouth 2 (two) times daily. 120 tablet 3   No current facility-administered medications for this encounter.    Allergies  Allergen Reactions  . Tussionex Pennkinetic Er [Hydrocod Polst-Cpm Polst Er] Other (See Comments)    "caused prostate problems"  . Ace Inhibitors Other (See Comments)    REACTION: cough  . Penicillins Other (See Comments)    "childhood allergy"    History   Social History  . Marital Status: Married    Spouse Name: [name]    Number of Children: 2  . Years of Education: [name]   Occupational History  . retired Solicitor     Social History Main Topics  . Smoking status: Former Smoker -- 1.50 packs/day for 40 years    Types:  Cigarettes    Quit date: 12/11/2002  . Smokeless tobacco: Former Systems developer  . Alcohol Use: No  . Drug Use: No  . Sexual Activity: Not Currently   Other Topics Concern  . Not on file   Social History Narrative    Blood pressure 96/48, pulse 67, weight 279 lb 8 oz (126.78 kg), SpO2 97 %.  General appearance: alert, cooperative and no distress Wife present.  Neck: no carotid bruit and JVP 10-12 cm. L CEA scar Lungs: Decreased breath sounds  throughout lung fields, rhonchi noted.  Heart: regular rate and rhythm, S1, S2 normal, 2/6 SEM at RUSB.  No S3/S4.  Extremities: 1+ edema to knees bilaterally Skin: warm and dry Neurologic: Grossly normal   ASSESSMENT/PLAN:  1. Chronic systolic CHF: Ischemic cardiomyopathy. ECHO 02/2014 EF 20-25%.  [employer] CRT-D. NYHA III symptoms, stable. Likely combination of CHF and moderate COPD. He is more volume overloaded in the setting of significant dietary indiscretion/heavy sodium intake in recent days.  - Stop Lasix, start torsemide 40 mg bid.  BMET/BNP today and in 10 days.  - Continue carvedilol 25 mg twice a day  - Continue current Entresto and spironolactone.   2. CAD s/p CABG: No ischemic symptoms. Continue 81 mg aspirin daily and atorvastatin daily.  Good lipids in 11/15.  3. VT s/p ICD: On amiodarone for history of ICD discharges due to VT.  Recent LFTs and TSH normal.  He should get yearly eye exams.  4. Morbid obesity: Needs to work on weight loss.  5. COPD: Moderate to severe.  I suspect that this contributes to his dyspnea.   [name] 11/27/2014

## 2014-12-08 ENCOUNTER — Ambulatory Visit (HOSPITAL_COMMUNITY)
Admission: RE | Admit: 2014-12-08 | Discharge: 2014-12-08 | Disposition: A | Discharge status: Home / Self Care | Payer: Medicare Other | Source: Ambulatory Visit | Attending: Internal Medicine | Admitting: Internal Medicine

## 2014-12-08 DIAGNOSIS — I5022 Chronic systolic (congestive) heart failure: Secondary | ICD-10-CM | POA: Diagnosis not present

## 2014-12-08 LAB — BASIC METABOLIC PANEL
Anion gap: 7 (ref 5–15)
BUN: 12 mg/dL (ref 6–23)
CO2: 31 mmol/L (ref 19–32)
Calcium: 8.6 mg/dL (ref 8.4–10.5)
Chloride: 104 mEq/L (ref 96–112)
Creatinine, Ser: 0.83 mg/dL (ref 0.50–1.35)
GFR calc Af Amer: >90 mL/min (ref >90)
GFR calc non Af Amer: 88 mL/min — ABNORMAL LOW (ref >90)
Glucose, Bld: 140 mg/dL — ABNORMAL HIGH (ref 70–99)
Potassium: 3.4 mmol/L — ABNORMAL LOW (ref 3.5–5.1)
Sodium: 142 mmol/L (ref 135–145)

## 2014-12-15 DIAGNOSIS — Z23 Encounter for immunization: Secondary | ICD-10-CM | POA: Diagnosis not present

## 2014-12-17 ENCOUNTER — Encounter (HOSPITAL_COMMUNITY): Payer: Medicare Other

## 2014-12-18 ENCOUNTER — Ambulatory Visit (HOSPITAL_COMMUNITY)
Admission: RE | Admit: 2014-12-18 | Discharge: 2014-12-18 | Disposition: A | Discharge status: Home / Self Care | Payer: Medicare Other | Source: Ambulatory Visit | Attending: Internal Medicine | Admitting: Internal Medicine

## 2014-12-18 VITALS — BP 110/60 | HR 70 | Wt 278.0 lb

## 2014-12-18 DIAGNOSIS — I5042 Chronic combined systolic (congestive) and diastolic (congestive) heart failure: Secondary | ICD-10-CM | POA: Diagnosis not present

## 2014-12-18 DIAGNOSIS — I2510 Atherosclerotic heart disease of native coronary artery without angina pectoris: Secondary | ICD-10-CM | POA: Diagnosis not present

## 2014-12-18 DIAGNOSIS — I472 Ventricular tachycardia: Secondary | ICD-10-CM | POA: Insufficient documentation

## 2014-12-18 DIAGNOSIS — I5189 Other ill-defined heart diseases: Secondary | ICD-10-CM

## 2014-12-18 DIAGNOSIS — I5022 Chronic systolic (congestive) heart failure: Secondary | ICD-10-CM | POA: Insufficient documentation

## 2014-12-18 DIAGNOSIS — I519 Heart disease, unspecified: Secondary | ICD-10-CM | POA: Diagnosis not present

## 2014-12-18 DIAGNOSIS — Z951 Presence of aortocoronary bypass graft: Secondary | ICD-10-CM | POA: Diagnosis not present

## 2014-12-18 DIAGNOSIS — J449 Chronic obstructive pulmonary disease, unspecified: Secondary | ICD-10-CM | POA: Insufficient documentation

## 2014-12-18 DIAGNOSIS — R0600 Dyspnea, unspecified: Secondary | ICD-10-CM | POA: Diagnosis not present

## 2014-12-18 MED ORDER — LOSARTAN POTASSIUM 25 MG PO TABS: 25.0000 mg | ORAL_TABLET | Freq: Two times a day (BID) | ORAL | Status: DC

## 2014-12-18 NOTE — Progress Notes (Signed)
Patient ID: [name], male   DOB: April 08, 1946, 69 y.o.   MRN: [id number] [facility]  12/18/2014 ACEA YAGI   1946-03-22  [id number]  Primary Physicia [name], MD Hematologist: Dr Alen Blew  Pulmonary: Dr Annamaria Boots  HPI:  [name] is a pleasant 69 yo with a past medical history significant for morbid obesity, systolic heart failure with an EF of 30-35% (January 2014) -> 20-25% (March 2015 - RV normal), AICD placement, VT on amio, CAD s/p CABG 1996,LBBB, COPD, obstructive sleep apnea on CPAP, AAA repair (January 2015) who was admitted to [facility] on 02/24/14 for acute on chronic HF and acute COPD exacerbation. He also had iron deficiency anemia.   Admitted to [facility] July 24 through July 06 2014 with chest pain.  CEs negative. Had a LHC with no change from previous LHC with recommendations to continue medical management. Discharge weight was 255 pounds.   LHC 07/06/14 --No significant change from previous studies.  Left anterior descending (LAD): The LAD is a large vessel. There is a 40% stenosis immediately after the takeoff of a large septal perforator. There are 2 large diagonal branches without significant disease.  Left circumflex (LCx): The LCx is occluded proximally. There are left to left collaterals.  Right coronary artery (RCA): The RCA is occluded proximally immediately following the conus branch. There are right to right and left to right collaterals.  SVGs from CABG known to be totally occluded.   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   He returns for follow up. Last visit lasix was switched to torsemide however he continued on lasix. Ongoing dyspnea with exertion. Tries to follow low salt diet.  Taking all medications. He is out entresto and refused to re start. Weight at home 270-280 pounds.    Labs 3/15 Cr 1.5 K 5.6 Labs 07/06/14 K 3.9 Creatinine  0.98 Labs 9/15 K 5.1, creatinine 0.96 Labs 11/15 K 3.8, creatinine 0.83, LDL 61, LFTs normal, TSH normal Labs  11/26/14 K 5.0 Creatinine 0.73  Labs 12/08/14 K 3.4 Creatinine 0.83   Past Medical History  Diagnosis Date  . Myocardial infarction   . Hypertension   . CHF (congestive heart failure)   . COPD (chronic obstructive pulmonary disease)   . GERD (gastroesophageal reflux disease)   . AAA (abdominal aortic aneurysm)     followed by Dr. Bridgett Larsson  . Tremor   . Dysrhythmia     ICD-defibrillator  . Carotid artery stenosis     LCEA - Dr. Bridgett Larsson in 2013  . Peripheral vascular disease     LCEA, L renal artery stent, bilat iliac stents, R SFA stenosis  . Adenomatous colon polyp 01/2004  . Diverticulosis   . AVM (arteriovenous malformation)   . CAD (coronary artery disease)     s/p CABGx2 in 1996  . HLD (hyperlipidemia)   . Hypothyroidism   . BPH (benign prostatic hypertrophy)   . Fatigue   . Automatic implantable cardioverter-defibrillator in situ   . OSA on CPAP     AHI durign total sleep 14.69/hr, during REM 50.91/hr  . H/O hiatal hernia   . Anemia     hx  . Complication of anesthesia     claustrophobic, unabe to lie on back more than 4 hours at time due to back  . Diabetes mellitus     DIET CONTROLLED  . Ischemic cardiomyopathy   . Ventricular tachycardia 09/09/2014    Amiodarone was started after appropriate defibrillator shocks for ventricular tachycardia in  October 2008    Current Outpatient Prescriptions  Medication Sig Dispense Refill  . albuterol (PROVENTIL HFA;VENTOLIN HFA) 108 (90 BASE) MCG/ACT inhaler Inhale 2 puffs into the lungs every 6 (six) hours as needed for wheezing or shortness of breath. 1 Inhaler 3  . albuterol (PROVENTIL) (2.5 MG/3ML) 0.083% nebulizer solution Take 3 mLs (2.5 mg total) by nebulization every 6 (six) hours as needed for wheezing or shortness of breath. 75 mL 12  . amiodarone (CORDARONE) 200 MG tablet Take 1 tablet (200 mg total) by mouth daily. 90 tablet 3  . aspirin 81 MG tablet Take 81 mg by mouth daily.    Marland Kitchen atorvastatin (LIPITOR) 40 MG tablet Take  1 tablet (40 mg total) by mouth daily. 90 tablet 3  . carvedilol (COREG) 25 MG tablet Take 1 tablet (25 mg total) by mouth 2 (two) times daily with a meal. 180 tablet 3  . clopidogrel (PLAVIX) 75 MG tablet Take 1 tablet (75 mg total) by mouth daily. 90 tablet 3  . finasteride (PROSCAR) 5 MG tablet Take 5 mg by mouth daily.    . furosemide (LASIX) 80 MG tablet Take 120 mg by mouth 2 (two) times daily.    . isosorbide mononitrate (IMDUR) 60 MG 24 hr tablet Take 1 tablet (60 mg total) by mouth daily. 60 tablet 4  . levothyroxine (SYNTHROID, LEVOTHROID) 50 MCG tablet Take 50 mcg by mouth daily before breakfast.    . Multiple Vitamins-Minerals (MENS 50+ MULTI VITAMIN/MIN) TABS Take 1 tablet by mouth daily.     . multivitamin-lutein (OCUVITE-LUTEIN) CAPS Take 1 capsule by mouth daily.      . NON FORMULARY at bedtime. CPAP And during the day as needed    . ranolazine (RANEXA) 500 MG 12 hr tablet Take 500 mg by mouth 2 (two) times daily.    . Sacubitril-Valsartan (ENTRESTO) 24-26 MG TABS Take 24-26 mg by mouth 2 (two) times daily. 60 tablet 3  . spironolactone (ALDACTONE) 25 MG tablet Take 25 mg by mouth daily.     Marland Kitchen NITROSTAT 0.4 MG SL tablet Dissolve 1 tablet under the tongue every 5 minutes as  needed for chest pain (up  to 3 tabs in 15 minutes  then call 911) (Patient not taking: Reported on 12/18/2014) 25 tablet 0   No current facility-administered medications for this encounter.    Allergies  Allergen Reactions  . Tussionex Pennkinetic Er [Hydrocod Polst-Cpm Polst Er] Other (See Comments)    "caused prostate problems"  . Ace Inhibitors Other (See Comments)    REACTION: cough  . Penicillins Other (See Comments)    "childhood allergy"    History   Social History  . Marital Status: Married    Spouse Name: [name]    Number of Children: 2  . Years of Education: [name]   Occupational History  . retired Solicitor     Social History Main Topics  . Smoking status: Former Smoker  -- 1.50 packs/day for 40 years    Types: Cigarettes    Quit date: 12/11/2002  . Smokeless tobacco: Former Systems developer  . Alcohol Use: No  . Drug Use: No  . Sexual Activity: Not Currently   Other Topics Concern  . Not on file   Social History Narrative    Blood pressure 110/60, pulse 70, weight 278 lb (126.1 kg), SpO2 93 %.  General appearance: alert, cooperative and no distress Wife present.  Neck: no carotid bruit and JVP 7-8 cm. L CEA scar  Lungs: Decreased breath sounds throughout lung fields, rhonchi noted.  Heart: regular rate and rhythm, S1, S2 normal, 2/6 SEM at RUSB.  No S3/S4.  Extremities: R and LLE trace edema to knees bilaterally Skin: warm and dry Neurologic: Grossly normal   ASSESSMENT/PLAN:  1. Chronic systolic CHF: Ischemic cardiomyopathy. ECHO 02/2014 EF 20-25%.  [employer] CRT-D. NYHA III symptoms, stable. Likely combination of CHF and moderate COPD. Volume status   - Continue lasix 120 mg twice a day. He did not start torsemide so I will continue for now.   - Continue carvedilol 25 mg twice a day  - Stop Entresto 24/26 mg twice a day because he refuses to continue due to cost. Start losartan 25 mg twice a day.  Continue spironolactone 25 mg daily.  Reinforced daily weights, low salt food choices, and limiting fluid intake to  2. CAD s/p CABG: No ischemic symptoms. Continue 81 mg aspirin daily and atorvastatin daily.  Good lipids in 11/15.  3. VT s/p ICD: On amiodarone for history of ICD discharges due to VT.  Recent LFTs and TSH normal.  He should get yearly eye exams.  4. Morbid obesity: Needs to work on weight loss. Encouraged to cut back portions.   5. COPD: Moderate to severe.  Contributing to dyspnea.   Follow up in 4 weeks.   CLEGG,AMY NP-C  12/18/2014

## 2014-12-18 NOTE — Patient Instructions (Signed)
STOP Entresto.  START Losartan 25mg  twice daily.  Follow up 4 weeks.  Do the following things EVERYDAY: 1) Weigh yourself in the morning before breakfast. Write it down and keep it in a log. 2) Take your medicines as prescribed 3) Eat low salt foods-Limit salt (sodium) to 2000 mg per day.  4) Stay as active as you can everyday 5) Limit all fluids for the day to less than 2 liters

## 2014-12-22 ENCOUNTER — Ambulatory Visit (INDEPENDENT_AMBULATORY_CARE_PROVIDER_SITE_OTHER): Payer: Medicare Other | Admitting: Ophthalmology

## 2014-12-22 DIAGNOSIS — H43813 Vitreous degeneration, bilateral: Secondary | ICD-10-CM

## 2014-12-22 DIAGNOSIS — H3531 Nonexudative age-related macular degeneration: Secondary | ICD-10-CM | POA: Diagnosis not present

## 2014-12-22 DIAGNOSIS — H35033 Hypertensive retinopathy, bilateral: Secondary | ICD-10-CM | POA: Diagnosis not present

## 2014-12-22 DIAGNOSIS — I10 Essential (primary) hypertension: Secondary | ICD-10-CM

## 2014-12-25 ENCOUNTER — Other Ambulatory Visit: Payer: Self-pay

## 2014-12-25 MED ORDER — ALBUTEROL SULFATE HFA 108 (90 BASE) MCG/ACT IN AERS: 2.0000 | INHALATION_SPRAY | Freq: Four times a day (QID) | RESPIRATORY_TRACT | Status: AC | PRN

## 2014-12-28 ENCOUNTER — Other Ambulatory Visit: Payer: Self-pay | Admitting: Internal Medicine

## 2015-01-08 ENCOUNTER — Other Ambulatory Visit: Payer: Self-pay | Admitting: Internal Medicine

## 2015-01-13 DIAGNOSIS — H524 Presbyopia: Secondary | ICD-10-CM | POA: Diagnosis not present

## 2015-01-13 DIAGNOSIS — H2513 Age-related nuclear cataract, bilateral: Secondary | ICD-10-CM | POA: Diagnosis not present

## 2015-01-13 DIAGNOSIS — H3531 Nonexudative age-related macular degeneration: Secondary | ICD-10-CM | POA: Diagnosis not present

## 2015-01-19 ENCOUNTER — Ambulatory Visit (INDEPENDENT_AMBULATORY_CARE_PROVIDER_SITE_OTHER): Payer: Medicare Other | Admitting: Internal Medicine

## 2015-01-19 ENCOUNTER — Telehealth: Payer: Self-pay | Admitting: Internal Medicine

## 2015-01-19 ENCOUNTER — Encounter: Payer: Self-pay | Admitting: Internal Medicine

## 2015-01-19 ENCOUNTER — Other Ambulatory Visit: Payer: Self-pay | Admitting: Internal Medicine

## 2015-01-19 VITALS — BP 112/52 | HR 57 | Ht 68.0 in | Wt 282.6 lb

## 2015-01-19 DIAGNOSIS — I5043 Acute on chronic combined systolic (congestive) and diastolic (congestive) heart failure: Secondary | ICD-10-CM | POA: Diagnosis not present

## 2015-01-19 DIAGNOSIS — I10 Essential (primary) hypertension: Secondary | ICD-10-CM | POA: Diagnosis not present

## 2015-01-19 DIAGNOSIS — I255 Ischemic cardiomyopathy: Secondary | ICD-10-CM | POA: Diagnosis not present

## 2015-01-19 DIAGNOSIS — I472 Ventricular tachycardia, unspecified: Secondary | ICD-10-CM

## 2015-01-19 DIAGNOSIS — Z95810 Presence of automatic (implantable) cardiac defibrillator: Secondary | ICD-10-CM | POA: Diagnosis not present

## 2015-01-19 NOTE — Assessment & Plan Note (Signed)
His blood pressure is well controlled. Continue current meds.

## 2015-01-19 NOTE — Telephone Encounter (Signed)
Received request from Nurse fax box, documents faxed for surgical clearance. To: [employer]  Fax number: [phone] Attention: 2.9.16/km

## 2015-01-19 NOTE — Assessment & Plan Note (Signed)
His [employer] BiV ICD is working normally. Will recheck in several months.

## 2015-01-19 NOTE — Assessment & Plan Note (Signed)
We discussed the importance of weight loss, increasing physical activity and reducing his oral intake.

## 2015-01-19 NOTE — Progress Notes (Addendum)
HPI [name] is referred today by Dr.  Sallyanne Kuster for consideration for biventricular ICD upgrade. He is a very pleasant 69 year old man with chronic systolic heart failure, class III, left bundle branch block, with a QRS duration of 135 ms, prior coronary artery disease and bypass grafting, with multiple remote stents status post percutaneous coronary intervention, ventricular tachycardia with VT storm in 2008, managed with chronic suppressive amiodarone therapy.  He admits to some dietary indiscretion, but he is trying to lose weight. He is s/p BiV ICD upgrade several months ago. He still has class 3A CHF (from class 3B). He is pending cataract surgery. Allergies  Allergen Reactions  . Tussionex Pennkinetic Er [Hydrocod Polst-Cpm Polst Er] Other (See Comments)    "caused prostate problems"  . Ace Inhibitors Other (See Comments)    REACTION: cough  . Penicillins Other (See Comments)    "childhood allergy"     Current Outpatient Prescriptions  Medication Sig Dispense Refill  . albuterol (PROVENTIL HFA;VENTOLIN HFA) 108 (90 BASE) MCG/ACT inhaler Inhale 2 puffs into the lungs every 6 (six) hours as needed for wheezing or shortness of breath. 3 Inhaler 4  . albuterol (PROVENTIL) (2.5 MG/3ML) 0.083% nebulizer solution Take 3 mLs (2.5 mg total) by nebulization every 6 (six) hours as needed for wheezing or shortness of breath. 75 mL 12  . amiodarone (CORDARONE) 200 MG tablet Take 1 tablet (200 mg total) by mouth daily. 90 tablet 3  . aspirin 81 MG tablet Take 81 mg by mouth daily.    Marland Kitchen atorvastatin (LIPITOR) 40 MG tablet Take 1 tablet (40 mg total) by mouth daily. 90 tablet 3  . carvedilol (COREG) 25 MG tablet Take 1 tablet (25 mg total) by mouth 2 (two) times daily with a meal. 180 tablet 3  . clopidogrel (PLAVIX) 75 MG tablet Take 1 tablet (75 mg total) by mouth daily. 90 tablet 3  . finasteride (PROSCAR) 5 MG tablet Take 5 mg by mouth daily.    . furosemide (LASIX) 80 MG tablet Take 120  mg by mouth 2 (two) times daily.    . isosorbide mononitrate (IMDUR) 60 MG 24 hr tablet Take 1 tablet (60 mg total) by mouth daily. 60 tablet 4  . levothyroxine (SYNTHROID, LEVOTHROID) 50 MCG tablet Take 50 mcg by mouth daily before breakfast.    . losartan (COZAAR) 25 MG tablet Take 1 tablet (25 mg total) by mouth 2 (two) times daily. 90 tablet 3  . Multiple Vitamins-Minerals (MENS 50+ MULTI VITAMIN/MIN) TABS Take 1 tablet by mouth daily.     . multivitamin-lutein (OCUVITE-LUTEIN) CAPS Take 1 capsule by mouth daily.      Marland Kitchen NITROSTAT 0.4 MG SL tablet Dissolve 1 tablet under the tongue every 5 minutes as  needed for chest pain (up  to 3 tabs in 15 minutes  then call 911) 25 tablet 0  . NON FORMULARY at bedtime. CPAP And during the day as needed    . ranolazine (RANEXA) 500 MG 12 hr tablet Take 500 mg by mouth 2 (two) times daily.    Marland Kitchen spironolactone (ALDACTONE) 25 MG tablet Take 25 mg by mouth daily.      No current facility-administered medications for this visit.     Past Medical History  Diagnosis Date  . Myocardial infarction   . Hypertension   . CHF (congestive heart failure)   . COPD (chronic obstructive pulmonary disease)   . GERD (gastroesophageal reflux disease)   . AAA (abdominal aortic  aneurysm)     followed by Dr. Bridgett Larsson  . Tremor   . Dysrhythmia     ICD-defibrillator  . Carotid artery stenosis     LCEA - Dr. Bridgett Larsson in 2013  . Peripheral vascular disease     LCEA, L renal artery stent, bilat iliac stents, R SFA stenosis  . Adenomatous colon polyp 01/2004  . Diverticulosis   . AVM (arteriovenous malformation)   . CAD (coronary artery disease)     s/p CABGx2 in 1996  . HLD (hyperlipidemia)   . Hypothyroidism   . BPH (benign prostatic hypertrophy)   . Fatigue   . Automatic implantable cardioverter-defibrillator in situ   . OSA on CPAP     AHI durign total sleep 14.69/hr, during REM 50.91/hr  . H/O hiatal hernia   . Anemia     hx  . Complication of anesthesia      claustrophobic, unabe to lie on back more than 4 hours at time due to back  . Diabetes mellitus     DIET CONTROLLED  . Ischemic cardiomyopathy   . Ventricular tachycardia 09/09/2014    Amiodarone was started after appropriate defibrillator shocks for ventricular tachycardia in October 2008    ROS:   All systems reviewed and negative except as noted in the HPI.   Past Surgical History  Procedure Laterality Date  . Coronary artery bypass graft  11/04/1985    x2 - PDA and sequential DX-OM (Dr. Redmond Pulling)  . Cardiac catheterization  09/16/2007    occlusion of both vein grafts, no significant LAD disease or diagonal disease, Cfx collaterals from the left, 70% in-stent restenosis of L renal artery, normal L main, RCA occluded ostially (Dr. Adora Fridge)  . Coronary angioplasty  01/08/2004    cutting balloon atherectomy & percutaneous intervention of RCIA in-stent restenosis (Dr. Marella Chimes)  . Renal artery stent Left 03/24/2004    6x40mm Genesis stent (Dr. Marella Chimes)  . Angioplasty      BILATERAL  LE  W/STENTS  . Endarterectomy  01/04/2012    Procedure: ENDARTERECTOMY CAROTID;left  Surgeon: Hinda Lenis, MD;  Location: [facility];  Service: Vascular;  Laterality: Left;  with patch angioplasty  . Carotid endarterectomy Left 01/04/12  . Cardiac defibrillator placement  06/2005    Guidant Vitality HE - ischemic cardiomyopathy - Dr. Marella Chimes  . Iron infusion  June 16, 2012  . Abdominal aortic endovascular stent graft [name] 01/06/2014    Procedure: ABDOMINAL AORTIC ENDOVASCULAR STENT GRAFT- GORE; ULTRASOUND GUIDED;  Surgeon: Conrad Avonia, MD;  Location: [facility];  Service: Vascular;  Laterality: [name];  . Polypectomy    . Iliac artery stent Bilateral 03/1997    and L SFA PTA  . Icd generator change  05/02/2010    [employer]  . Transthoracic echocardiogram  12/2012    EF 30-35; LV mod to severely dilated, mod concentric hypertrophy, severe hypokinesis of inferolateral myocardium, moderate  hypokineis of anteroseptal region, grade 1 diastolic dysfunction; mod MR; LA mod-severely dialted; RV mod dialted; RA mildly dilated; PA peak pressure 58mmHg  . Nm myocar perf wall motion  09/2012    lexiscan myoview - mod-severe perfusion defect r/t infarct or scar w/mild periinfarct ishcemia in basal inferior, mid inferior, apical inferior, basal inferolateral & mid inferoalteral regions - EF 21% low risk scan  . Cardiac catheterization  10/03/2002    SVG sequentially to OM1 & OM2 totally occluded at ostium, SVG to PDA totally occluded within previously placed prox vein  graft stent, smal distal AAA, bialt iliac stents with 30% left end-stent restenosis and 50% right end-stent restnosis(Dr. Gerrie Nordmann)  . Cardiac catheterization  06/11/1998    L main with 20% narrowing in distal 1/3; LAD with 1st diagonla having 70% ostial narrowing, 2nd diagonal with 40% narrowing in prox third, LIMA & RIMA widely patent; in-stent restenosis of RCIA with successful PTA and new prox SVTRCA stent for residual disease (Dr. Marella Chimes)  . Biv icd genertaor change out  10/09/2014    UPGRADE TO BIV        BY DR Lovena Le  . Left heart catheterization with coronary angiogram [name] 07/06/2014    Procedure: LEFT HEART CATHETERIZATION WITH CORONARY ANGIOGRAM;  Surgeon: Peter M Martinique, MD;  Location: [facility] CATH LAB;  Service: Cardiovascular;  Laterality: [name];  . Bi-ventricular implantable cardioverter defibrillator upgrade [name] 10/09/2014    Procedure: BI-VENTRICULAR IMPLANTABLE CARDIOVERTER DEFIBRILLATOR UPGRADE;  Surgeon: Evans Lance, MD;  Location: [facility] CATH LAB;  Service: Cardiovascular;  Laterality: [name];     Family History  Problem Relation Age of Onset  . Colon cancer Sister   . Cancer Sister   . Hypertension Sister   . Hyperlipidemia Sister   . Diabetes Sister   . Heart disease Sister   . Heart disease Brother   . Lung cancer Mother   . Cancer Mother   . Diabetes Mother   . Hypertension Mother   . Hyperlipidemia  Mother   . Heart attack Maternal Grandmother   . Colon polyps Sister   . Heart attack Father   . Heart disease Father   . COPD Sister   . Diabetes Daughter      History   Social History  . Marital Status: Married    Spouse Name: [name]    Number of Children: 2  . Years of Education: [name]   Occupational History  . retired Solicitor     Social History Main Topics  . Smoking status: Former Smoker -- 1.50 packs/day for 40 years    Types: Cigarettes    Quit date: 12/11/2002  . Smokeless tobacco: Former Systems developer  . Alcohol Use: No  . Drug Use: No  . Sexual Activity: Not Currently   Other Topics Concern  . Not on file   Social History Narrative     BP 112/52 mmHg  Pulse 57  Ht 5\' 8"  (1.727 m)  Wt 282 lb 9.6 oz (128.187 kg)  BMI 42.98 kg/m2  Physical Exam:  Well appearing obese man, NAD HEENT: Unremarkable Neck:  No JVD, no thyromegally Back:  No CVA tenderness Lungs:  Clear with no wheezes, rales, or rhonchi. HEART:  Regular rate rhythm, no murmurs, no rubs, no clicks Abd:  soft, obese, positive bowel sounds, no organomegally, no rebound, no guarding Ext:  2 plus pulses, 1+ peripheral edema, no cyanosis, no clubbing Skin:  No rashes no nodules Neuro:  CN II through XII intact, motor grossly intact  EKG - normal sinus rhythm with BiV pacing  DEVICE  Normal device function.  See PaceArt for details.   Assess/Plan:

## 2015-01-19 NOTE — Assessment & Plan Note (Signed)
He denies anginal symptoms. He remains fairly sedentary but is trying to increase his activity.

## 2015-01-19 NOTE — Assessment & Plan Note (Signed)
ICD interrogation demonstrates no recurrent VT. He will continue low dose amiodarone.

## 2015-01-19 NOTE — Patient Instructions (Signed)
Your physician recommends that you continue on your current medications as directed. Please refer to the Current Medication list given to you today. Your physician wants you to follow-up in: 9 months with Dr. Lovena Le.  You will receive a reminder letter in the mail two months in advance. If you don't receive a letter, please call our office to schedule the follow-up appointment.

## 2015-01-21 LAB — MDC_IDC_ENUM_SESS_TYPE_INCLINIC
Battery Remaining Longevity: 8
Brady Statistic RA Percent Paced: 1 % — CL
Brady Statistic RV Percent Paced: 89 %
HighPow Impedance: 43 Ohm
Implantable Pulse Generator Serial Number: 370874
Lead Channel Impedance Value: 533 Ohm
Lead Channel Impedance Value: 533 Ohm
Lead Channel Impedance Value: 693 Ohm
Lead Channel Pacing Threshold Amplitude: 0.8 V
Lead Channel Pacing Threshold Amplitude: 0.9 V
Lead Channel Pacing Threshold Amplitude: 3.5 V
Lead Channel Pacing Threshold Pulse Width: 0.2 ms
Lead Channel Pacing Threshold Pulse Width: 0.4 ms
Lead Channel Pacing Threshold Pulse Width: 0.4 ms
Lead Channel Sensing Intrinsic Amplitude: 15.2 mV
Lead Channel Sensing Intrinsic Amplitude: 2 mV
Lead Channel Setting Pacing Amplitude: 2 V
Lead Channel Setting Pacing Amplitude: 2 V
Lead Channel Setting Pacing Amplitude: 3.5 V
Lead Channel Setting Pacing Pulse Width: 0.4 ms
Lead Channel Setting Pacing Pulse Width: 0.9
Lead Channel Setting Sensing Sensitivity: 0.6 mV
Lead Channel Setting Sensing Sensitivity: 1 mV
Zone Setting Detection Interval: 273 ms
Zone Setting Detection Interval: 316 ms
Zone Setting Detection Interval: 375 ms

## 2015-01-22 ENCOUNTER — Other Ambulatory Visit: Payer: Self-pay | Admitting: *Deleted

## 2015-01-22 MED ORDER — SPIRONOLACTONE 25 MG PO TABS: 25.0000 mg | ORAL_TABLET | Freq: Every day | ORAL | Status: DC

## 2015-01-22 NOTE — Telephone Encounter (Signed)
Rx(s) sent to pharmacy electronically.  

## 2015-01-25 ENCOUNTER — Telehealth: Payer: Self-pay | Admitting: Oncology

## 2015-01-25 NOTE — Telephone Encounter (Signed)
Pt's wife called to r/s due to she is working and will be the one bringing him in, she confirmed labs/ov from 02/23 to 03/08.... KJ

## 2015-02-01 DIAGNOSIS — H2511 Age-related nuclear cataract, right eye: Secondary | ICD-10-CM | POA: Diagnosis not present

## 2015-02-02 ENCOUNTER — Other Ambulatory Visit: Payer: Medicare Other

## 2015-02-02 ENCOUNTER — Ambulatory Visit: Payer: Medicare Other | Admitting: Oncology

## 2015-02-08 ENCOUNTER — Telehealth: Payer: Self-pay | Admitting: Internal Medicine

## 2015-02-08 NOTE — Telephone Encounter (Signed)
Surgical clearance form from [facility] and 01/19/15 ov note faxed to [place], rmf.

## 2015-02-10 DIAGNOSIS — H2511 Age-related nuclear cataract, right eye: Secondary | ICD-10-CM | POA: Diagnosis not present

## 2015-02-10 DIAGNOSIS — H25811 Combined forms of age-related cataract, right eye: Secondary | ICD-10-CM | POA: Diagnosis not present

## 2015-02-12 ENCOUNTER — Encounter: Payer: Self-pay | Admitting: Gastroenterology

## 2015-02-16 ENCOUNTER — Ambulatory Visit (HOSPITAL_BASED_OUTPATIENT_CLINIC_OR_DEPARTMENT_OTHER): Payer: Medicare Other | Admitting: Oncology

## 2015-02-16 ENCOUNTER — Other Ambulatory Visit (HOSPITAL_BASED_OUTPATIENT_CLINIC_OR_DEPARTMENT_OTHER): Payer: Medicare Other

## 2015-02-16 ENCOUNTER — Telehealth: Payer: Self-pay | Admitting: Oncology

## 2015-02-16 VITALS — BP 165/81 | HR 67 | Temp 98.0°F | Resp 18 | Ht 68.0 in | Wt 274.0 lb

## 2015-02-16 DIAGNOSIS — D509 Iron deficiency anemia, unspecified: Secondary | ICD-10-CM

## 2015-02-16 DIAGNOSIS — D539 Nutritional anemia, unspecified: Secondary | ICD-10-CM

## 2015-02-16 LAB — FERRITIN CHCC: Ferritin: 13 ng/ml — ABNORMAL LOW (ref 22–316)

## 2015-02-16 LAB — COMPREHENSIVE METABOLIC PANEL (CC13)
ALT: 17 U/L (ref 0–55)
AST: 17 U/L (ref 5–34)
Albumin: 3.9 g/dL (ref 3.5–5.0)
Alkaline Phosphatase: 108 U/L (ref 40–150)
Anion Gap: 12 mEq/L — ABNORMAL HIGH (ref 3–11)
BUN: 18.5 mg/dL (ref 7.0–26.0)
CO2: 27 mEq/L (ref 22–29)
Calcium: 9.5 mg/dL (ref 8.4–10.4)
Chloride: 105 mEq/L (ref 98–109)
Creatinine: 1 mg/dL (ref 0.7–1.3)
EGFR: 80 mL/min/{1.73_m2} — ABNORMAL LOW (ref >90)
Glucose: 135 mg/dl (ref 70–140)
Potassium: 4.2 mEq/L (ref 3.5–5.1)
Sodium: 145 mEq/L (ref 136–145)
Total Bilirubin: 0.42 mg/dL (ref 0.20–1.20)
Total Protein: 7.3 g/dL (ref 6.4–8.3)

## 2015-02-16 LAB — CBC WITH DIFFERENTIAL/PLATELET
BASO%: 0.8 % (ref 0.0–2.0)
Basophils Absolute: 0.1 10*3/uL (ref 0.0–0.1)
EOS%: 2.6 % (ref 0.0–7.0)
Eosinophils Absolute: 0.2 10*3/uL (ref 0.0–0.5)
HCT: 39.4 % (ref 38.4–49.9)
HGB: 11.9 g/dL — ABNORMAL LOW (ref 13.0–17.1)
LYMPH%: 16.2 % (ref 14.0–49.0)
MCH: 24.1 pg — ABNORMAL LOW (ref 27.2–33.4)
MCHC: 30.3 g/dL — ABNORMAL LOW (ref 32.0–36.0)
MCV: 79.5 fL (ref 79.3–98.0)
MONO#: 0.8 10*3/uL (ref 0.1–0.9)
MONO%: 10.3 % (ref 0.0–14.0)
NEUT#: 5.7 10*3/uL (ref 1.5–6.5)
NEUT%: 70.1 % (ref 39.0–75.0)
Platelets: 196 10*3/uL (ref 140–400)
RBC: 4.95 10*6/uL (ref 4.20–5.82)
RDW: 18 % — ABNORMAL HIGH (ref 11.0–14.6)
WBC: 8.1 10*3/uL (ref 4.0–10.3)
lymph#: 1.3 10*3/uL (ref 0.9–3.3)

## 2015-02-16 LAB — IRON AND TIBC CHCC
%SAT: 7 % — ABNORMAL LOW (ref 20–55)
Iron: 28 ug/dL — ABNORMAL LOW (ref 42–163)
TIBC: 398 ug/dL (ref 202–409)
UIBC: 370 ug/dL (ref 117–376)

## 2015-02-16 NOTE — Telephone Encounter (Signed)
gv and printed appt sched and avs for pt for March and Sept...sed added tx.

## 2015-02-16 NOTE — Progress Notes (Signed)
Hematology and Oncology Follow Up Visit  [name] [id number] 23-May-1946 69 y.o. 02/16/2015 3:12 PM   Principle Diagnosis: 69 year old with iron deficiency anemia diagnosed in 06/2012. This is likely due to GI  loss from AVMs.  Prior Therapy: S/P Feraheme in 06/2012.   Interim History:  [name] presents today for a follow up visit with his wife. Since his last visit, he is doing relatively well and currently at his baseline. He does have congestive heart failure and does have intermittent hospitalizations regarding that. His shortness of breath is at baseline level without any major changes since the last evaluation. He is more fatigued and have noticed more decline in his exercise tolerance.  He is no longer reporting any edema. No active bleeding noted. He does report some occasional hematochezia but no overt bleeding at this time.  He does not report any headaches or blurry does not report any seizure or syncope. He does not report any lymphadenopathy or petechiae. Does not report any genitourinary complaints. His review of systems unremarkable.    Medications: I have reviewed the patient's current medications.  Current Outpatient Prescriptions  Medication Sig Dispense Refill  . albuterol (PROVENTIL HFA;VENTOLIN HFA) 108 (90 BASE) MCG/ACT inhaler Inhale 2 puffs into the lungs every 6 (six) hours as needed for wheezing or shortness of breath. 3 Inhaler 4  . albuterol (PROVENTIL) (2.5 MG/3ML) 0.083% nebulizer solution Take 3 mLs (2.5 mg total) by nebulization every 6 (six) hours as needed for wheezing or shortness of breath. 75 mL 12  . amiodarone (CORDARONE) 200 MG tablet Take 1 tablet (200 mg total) by mouth daily. 90 tablet 3  . aspirin 81 MG tablet Take 81 mg by mouth daily.    Marland Kitchen atorvastatin (LIPITOR) 40 MG tablet Take 1 tablet (40 mg total) by mouth daily. 90 tablet 3  . carvedilol (COREG) 25 MG tablet Take 1 tablet (25 mg total) by mouth 2 (two) times daily with a meal. 180 tablet 3   . clopidogrel (PLAVIX) 75 MG tablet Take 1 tablet (75 mg total) by mouth daily. 90 tablet 3  . finasteride (PROSCAR) 5 MG tablet Take 5 mg by mouth daily.    . furosemide (LASIX) 80 MG tablet Take 120 mg by mouth 2 (two) times daily.    . isosorbide mononitrate (IMDUR) 60 MG 24 hr tablet Take 1 tablet (60 mg total) by mouth daily. 60 tablet 4  . levothyroxine (SYNTHROID, LEVOTHROID) 50 MCG tablet Take 50 mcg by mouth daily before breakfast.    . losartan (COZAAR) 25 MG tablet Take 1 tablet (25 mg total) by mouth 2 (two) times daily. 90 tablet 3  . Multiple Vitamins-Minerals (MENS 50+ MULTI VITAMIN/MIN) TABS Take 1 tablet by mouth daily.     . multivitamin-lutein (OCUVITE-LUTEIN) CAPS Take 1 capsule by mouth daily.      Marland Kitchen NITROSTAT 0.4 MG SL tablet Dissolve 1 tablet under the tongue every 5 minutes as  needed for chest pain (up  to 3 tabs in 15 minutes  then call 911) 25 tablet 0  . NON FORMULARY at bedtime. CPAP And during the day as needed    . ranolazine (RANEXA) 500 MG 12 hr tablet Take 500 mg by mouth 2 (two) times daily.    Marland Kitchen spironolactone (ALDACTONE) 25 MG tablet Take 1 tablet (25 mg total) by mouth daily. 90 tablet 1   No current facility-administered medications for this visit.     Allergies:  Allergies  Allergen Reactions  .  Tussionex Pennkinetic Er [Hydrocod Polst-Cpm Polst Er] Other (See Comments)    "caused prostate problems"  . Ace Inhibitors Other (See Comments)    REACTION: cough  . Penicillins Other (See Comments)    "childhood allergy"    Past Medical History, Surgical history, Social history, and Family History were reviewed and updated.     Physical Exam: Blood pressure 165/81, pulse 67, temperature 98 F (36.7 C), temperature source Oral, resp. rate 18, height 5\' 8"  (1.727 m), weight 274 lb (124.286 kg), SpO2 96 %. ECOG: 1 General appearance: alert awake gentleman not in any distress. Head: Normocephalic, without obvious abnormality, atraumatic Neck: no  JVD. No lymphadenopathy noted. Lymph nodes: Cervical, supraclavicular, and axillary nodes normal. Heart:regular rate and rhythm, S1, S2 normal, no murmur, click, rub or gallop Lung:chest clear, no wheezing, rales, normal symmetric air entry Abdomen: soft, non-tender, without masses or organomegaly EXT:no erythema, induration, or nodules   Lab Results: Lab Results  Component Value Date   WBC 8.1 02/16/2015   HGB 11.9* 02/16/2015   HCT 39.4 02/16/2015   MCV 79.5 02/16/2015   PLT 196 02/16/2015     Chemistry      Component Value Date/Time   NA 142 12/08/2014 0904   NA 139 10/03/2013 1036   K 3.4* 12/08/2014 0904   K 5.1 10/03/2013 1036   CL 104 12/08/2014 0904   CL 94* 10/02/2012 0937   CO2 31 12/08/2014 0904   CO2 28 10/03/2013 1036   BUN 12 12/08/2014 0904   BUN 22.4 10/03/2013 1036   CREATININE 0.83 12/08/2014 0904   CREATININE 0.75 10/31/2013 1216   CREATININE 1.0 10/03/2013 1036      Component Value Date/Time   CALCIUM 8.6 12/08/2014 0904   CALCIUM 9.9 10/03/2013 1036   ALKPHOS 87 10/22/2014 1010   ALKPHOS 83 10/03/2013 1036   AST 17 10/22/2014 1010   AST 15 10/03/2013 1036   ALT 17 10/22/2014 1010   ALT 18 10/03/2013 1036   BILITOT 0.5 10/22/2014 1010   BILITOT 0.99 10/03/2013 1036     Results for Tyrone, Small (MRN [id number]) as of 02/16/2015 14:44  Ref. Range 08/04/2014 09:10  Iron Latest Range: 42-165 ug/dL 55  UIBC Latest Range: 125-400 ug/dL 308  TIBC Latest Range: 215-435 ug/dL 363  %SAT Latest Range: 20-55 % 15 (L)  Ferritin Latest Range: 22-322 ng/mL 13 (L)    Impression and Plan:  A 69 year old gentleman with the following issues:  1. Microcytic hypochromic anemia with a documented iron deficiency anemia  in May of 2013. He is status post IV iron at that time with resolution of his iron deficiency anemia. His most recent iron studies in August 2015 showed declining ferritin level in his hemoglobin is slightly lower. He is symptomatic at this time  and I offered him IV iron treatment. Risks and benefits of Feraheme were discussed again and complications that include infusion related complications and rarely anaphylaxis were discussed and he is agreeable to proceed.  2. GI blood loss: he is following with GI for that. It seems to have resolved now.   3. Follow up: in 6 months.    [facility] 3/8/20163:12 PM

## 2015-02-17 ENCOUNTER — Ambulatory Visit (INDEPENDENT_AMBULATORY_CARE_PROVIDER_SITE_OTHER): Payer: Medicare Other | Admitting: Internal Medicine

## 2015-02-17 ENCOUNTER — Other Ambulatory Visit (INDEPENDENT_AMBULATORY_CARE_PROVIDER_SITE_OTHER): Payer: Medicare Other

## 2015-02-17 VITALS — BP 154/74 | HR 64 | Temp 97.9°F | Resp 18 | Wt 274.0 lb

## 2015-02-17 DIAGNOSIS — N401 Enlarged prostate with lower urinary tract symptoms: Secondary | ICD-10-CM | POA: Diagnosis not present

## 2015-02-17 DIAGNOSIS — E034 Atrophy of thyroid (acquired): Secondary | ICD-10-CM

## 2015-02-17 DIAGNOSIS — R7303 Prediabetes: Secondary | ICD-10-CM | POA: Insufficient documentation

## 2015-02-17 DIAGNOSIS — I10 Essential (primary) hypertension: Secondary | ICD-10-CM | POA: Diagnosis not present

## 2015-02-17 DIAGNOSIS — E038 Other specified hypothyroidism: Secondary | ICD-10-CM

## 2015-02-17 DIAGNOSIS — I255 Ischemic cardiomyopathy: Secondary | ICD-10-CM | POA: Diagnosis not present

## 2015-02-17 DIAGNOSIS — R739 Hyperglycemia, unspecified: Secondary | ICD-10-CM

## 2015-02-17 DIAGNOSIS — N138 Other obstructive and reflux uropathy: Secondary | ICD-10-CM | POA: Diagnosis not present

## 2015-02-17 DIAGNOSIS — D509 Iron deficiency anemia, unspecified: Secondary | ICD-10-CM

## 2015-02-17 DIAGNOSIS — N471 Phimosis: Secondary | ICD-10-CM | POA: Diagnosis not present

## 2015-02-17 DIAGNOSIS — H2512 Age-related nuclear cataract, left eye: Secondary | ICD-10-CM | POA: Diagnosis not present

## 2015-02-17 LAB — CBC WITH DIFFERENTIAL/PLATELET
Basophils Absolute: 0 10*3/uL (ref 0.0–0.1)
Basophils Relative: 0.4 % (ref 0.0–3.0)
Eosinophils Absolute: 0.3 10*3/uL (ref 0.0–0.7)
Eosinophils Relative: 3.4 % (ref 0.0–5.0)
HCT: 37.8 % — ABNORMAL LOW (ref 39.0–52.0)
Hemoglobin: 12.1 g/dL — ABNORMAL LOW (ref 13.0–17.0)
Lymphocytes Relative: 21 % (ref 12.0–46.0)
Lymphs Abs: 1.6 10*3/uL (ref 0.7–4.0)
MCHC: 32 g/dL (ref 30.0–36.0)
MCV: 77 fl — ABNORMAL LOW (ref 78.0–100.0)
Monocytes Absolute: 0.8 10*3/uL (ref 0.1–1.0)
Monocytes Relative: 11 % (ref 3.0–12.0)
Neutro Abs: 4.8 10*3/uL (ref 1.4–7.7)
Neutrophils Relative %: 64.2 % (ref 43.0–77.0)
Platelets: 205 10*3/uL (ref 150.0–400.0)
RBC: 4.91 Mil/uL (ref 4.22–5.81)
RDW: 18.1 % — ABNORMAL HIGH (ref 11.5–15.5)
WBC: 7.4 10*3/uL (ref 4.0–10.5)

## 2015-02-17 LAB — HEMOGLOBIN A1C: Hgb A1c MFr Bld: 6.3 % (ref 4.6–6.5)

## 2015-02-17 LAB — TSH: TSH: 4.39 u[IU]/mL (ref 0.35–4.50)

## 2015-02-17 NOTE — Progress Notes (Signed)
Subjective:    Patient ID: [name], male    DOB: 11/06/1946, 69 y.o.   MRN: [id number]  Thyroid Problem Presents for follow-up visit. Symptoms include fatigue, leg swelling and weight gain. Patient reports no anxiety, cold intolerance, constipation, depressed mood, diaphoresis, diarrhea, dry skin, hair loss, heat intolerance, hoarse voice, nail problem, palpitations, tremors, visual change or weight loss. The symptoms have been worsening (he has not taken synthroid in several weeks). Past treatments include levothyroxine. The treatment provided mild relief. His past medical history is significant for heart failure.      Review of Systems  Constitutional: Positive for weight gain and fatigue. Negative for fever, chills, weight loss, diaphoresis, appetite change and unexpected weight change.  HENT: Negative.  Negative for hoarse voice.   Eyes: Negative.   Respiratory: Negative.  Negative for cough, choking, chest tightness, shortness of breath and stridor.   Cardiovascular: Negative.  Negative for chest pain, palpitations and leg swelling.  Gastrointestinal: Negative.  Negative for nausea, vomiting, abdominal pain, diarrhea and constipation.  Endocrine: Negative.  Negative for cold intolerance and heat intolerance.  Genitourinary: Negative.   Musculoskeletal: Negative.   Skin: Negative.  Negative for rash.  Allergic/Immunologic: Negative.   Neurological: Negative.  Negative for dizziness, tremors, syncope, weakness, light-headedness and headaches.  Hematological: Negative.  Negative for adenopathy. Does not bruise/bleed easily.  Psychiatric/Behavioral: Negative.  The patient is not nervous/anxious.        Objective:   Physical Exam  Constitutional: He is oriented to person, place, and time. He appears well-developed and well-nourished. No distress.  HENT:  Head: Normocephalic and atraumatic.  Mouth/Throat: Oropharynx is clear and moist. No oropharyngeal exudate.  Eyes:  Conjunctivae are normal. Right eye exhibits no discharge. Left eye exhibits no discharge. No scleral icterus.  Neck: Normal range of motion. Neck supple. No JVD present. No tracheal deviation present. No thyromegaly present.  Cardiovascular: Normal rate, regular rhythm, S1 normal, S2 normal and intact distal pulses.  Exam reveals no gallop, no S3, no S4 and no friction rub.   Murmur heard.  Systolic murmur is present with a grade of 1/6   No diastolic murmur is present  Pulmonary/Chest: Effort normal and breath sounds normal. No stridor. No respiratory distress. He has no wheezes. He has no rales. He exhibits no tenderness.  Abdominal: Soft. Bowel sounds are normal. He exhibits no distension and no mass. There is no tenderness. There is no rebound and no guarding.  Musculoskeletal: Normal range of motion. He exhibits edema (trace pitting edema in BLE). He exhibits no tenderness.  Lymphadenopathy:    He has no cervical adenopathy.  Neurological: He is oriented to person, place, and time.  Skin: Skin is warm and dry. No rash noted. He is not diaphoretic. No erythema. No pallor.  Vitals reviewed.    Lab Results  Component Value Date   WBC 8.1 02/16/2015   HGB 11.9* 02/16/2015   HCT 39.4 02/16/2015   PLT 196 02/16/2015   GLUCOSE 135 02/16/2015   CHOL 122 10/22/2014   TRIG 105 10/22/2014   HDL 40 10/22/2014   LDLCALC 61 10/22/2014   ALT 17 02/16/2015   AST 17 02/16/2015   NA 145 02/16/2015   K 4.2 02/16/2015   CL 104 12/08/2014   CREATININE 1.0 02/16/2015   BUN 18.5 02/16/2015   CO2 27 02/16/2015   TSH 3.820 10/22/2014   PSA 0.51 04/18/2012   INR 1.0 10/05/2014   HGBA1C 5.9 06/18/2013  Assessment & Plan:

## 2015-02-17 NOTE — Progress Notes (Signed)
Pre visit review using our clinic review tool, if applicable. No additional management support is needed unless otherwise documented below in the visit note. 

## 2015-02-17 NOTE — Patient Instructions (Signed)
Hypothyroidism The thyroid is a large gland located in the lower front of your neck. The thyroid gland helps control metabolism. Metabolism is how your body handles food. It controls metabolism with the hormone thyroxine. When this gland is underactive (hypothyroid), it produces too little hormone.  CAUSES These include:   Absence or destruction of thyroid tissue.  Goiter due to iodine deficiency.  Goiter due to medications.  Congenital defects (since birth).  Problems with the pituitary. This causes a lack of TSH (thyroid stimulating hormone). This hormone tells the thyroid to turn out more hormone. SYMPTOMS  Lethargy (feeling as though you have no energy)  Cold intolerance  Weight gain (in spite of normal food intake)  Dry skin  Coarse hair  Menstrual irregularity (if severe, may lead to infertility)  Slowing of thought processes Cardiac problems are also caused by insufficient amounts of thyroid hormone. Hypothyroidism in the newborn is cretinism, and is an extreme form. It is important that this form be treated adequately and immediately or it will lead rapidly to retarded physical and mental development. DIAGNOSIS  To prove hypothyroidism, your caregiver may do blood tests and ultrasound tests. Sometimes the signs are hidden. It may be necessary for your caregiver to watch this illness with blood tests either before or after diagnosis and treatment. TREATMENT  Low levels of thyroid hormone are increased by using synthetic thyroid hormone. This is a safe, effective treatment. It usually takes about four weeks to gain the full effects of the medication. After you have the full effect of the medication, it will generally take another four weeks for problems to leave. Your caregiver may start you on low doses. If you have had heart problems the dose may be gradually increased. It is generally not an emergency to get rapidly to normal. HOME CARE INSTRUCTIONS   Take your  medications as your caregiver suggests. Let your caregiver know of any medications you are taking or start taking. Your caregiver will help you with dosage schedules.  As your condition improves, your dosage needs may increase. It will be necessary to have continuing blood tests as suggested by your caregiver.  Report all suspected medication side effects to your caregiver. SEEK MEDICAL CARE IF: Seek medical care if you develop:  Sweating.  Tremulousness (tremors).  Anxiety.  Rapid weight loss.  Heat intolerance.  Emotional swings.  Diarrhea.  Weakness. SEEK IMMEDIATE MEDICAL CARE IF:  You develop chest pain, an irregular heart beat (palpitations), or a rapid heart beat. MAKE SURE YOU:   Understand these instructions.  Will watch your condition.  Will get help right away if you are not doing well or get worse. Document Released: 11/27/2005 Document Revised: 02/19/2012 Document Reviewed: 07/17/2008 Exit[employer] Patient Information 2015 [employer]. This information is not intended to replace advice given to you by your health care provider. Make sure you discuss any questions you have with your health care provider.  

## 2015-02-18 ENCOUNTER — Encounter: Payer: Self-pay | Admitting: Internal Medicine

## 2015-02-18 LAB — BASIC METABOLIC PANEL
BUN: 24 mg/dL — ABNORMAL HIGH (ref 6–23)
CO2: 30 mEq/L (ref 19–32)
Calcium: 9.5 mg/dL (ref 8.4–10.5)
Chloride: 104 mEq/L (ref 96–112)
Creatinine, Ser: 1.03 mg/dL (ref 0.40–1.50)
GFR: 75.86 mL/min (ref >60.00)
Glucose, Bld: 102 mg/dL — ABNORMAL HIGH (ref 70–99)
Potassium: 4.4 mEq/L (ref 3.5–5.1)
Sodium: 141 mEq/L (ref 135–145)

## 2015-02-18 MED ORDER — LEVOTHYROXINE SODIUM 50 MCG PO TABS: 50.0000 ug | ORAL_TABLET | Freq: Every day | ORAL | Status: DC

## 2015-02-18 NOTE — Assessment & Plan Note (Signed)
He has pre-diabetes, no meds needed at this time He will improve his lifestyle modifications

## 2015-02-18 NOTE — Assessment & Plan Note (Signed)
His BP is adequately well controlled His lytes and renal function are stable 

## 2015-02-18 NOTE — Assessment & Plan Note (Signed)
His TSh is in the high normal zone Will restart synthroid at the previous dose

## 2015-02-19 ENCOUNTER — Ambulatory Visit
Admission: RE | Admit: 2015-02-19 | Discharge: 2015-02-19 | Disposition: A | Discharge status: Home / Self Care | Payer: Medicare Other | Source: Ambulatory Visit | Attending: Vascular Surgery | Admitting: Vascular Surgery

## 2015-02-19 ENCOUNTER — Ambulatory Visit: Payer: Medicare Other | Admitting: Vascular Surgery

## 2015-02-19 ENCOUNTER — Other Ambulatory Visit: Payer: Self-pay | Admitting: *Deleted

## 2015-02-19 ENCOUNTER — Encounter: Payer: Self-pay | Admitting: Internal Medicine

## 2015-02-19 DIAGNOSIS — Z48812 Encounter for surgical aftercare following surgery on the circulatory system: Secondary | ICD-10-CM

## 2015-02-19 DIAGNOSIS — I714 Abdominal aortic aneurysm, without rupture, unspecified: Secondary | ICD-10-CM

## 2015-02-19 DIAGNOSIS — R911 Solitary pulmonary nodule: Secondary | ICD-10-CM

## 2015-02-19 MED ORDER — IOPAMIDOL (ISOVUE-370) INJECTION 76%
100.0000 mL | Freq: Once | INTRAVENOUS | Status: AC | PRN
  Administered 2015-02-19: 100 mL via INTRAVENOUS

## 2015-02-22 ENCOUNTER — Ambulatory Visit (HOSPITAL_COMMUNITY)
Admission: RE | Admit: 2015-02-22 | Discharge: 2015-02-22 | Disposition: A | Discharge status: Home / Self Care | Payer: Medicare Other | Source: Ambulatory Visit | Attending: Cardiology | Admitting: Cardiology

## 2015-02-22 VITALS — BP 120/54 | HR 70 | Wt 272.0 lb

## 2015-02-22 DIAGNOSIS — I472 Ventricular tachycardia: Secondary | ICD-10-CM | POA: Insufficient documentation

## 2015-02-22 DIAGNOSIS — I2510 Atherosclerotic heart disease of native coronary artery without angina pectoris: Secondary | ICD-10-CM

## 2015-02-22 DIAGNOSIS — J438 Other emphysema: Secondary | ICD-10-CM | POA: Diagnosis not present

## 2015-02-22 DIAGNOSIS — J449 Chronic obstructive pulmonary disease, unspecified: Secondary | ICD-10-CM | POA: Diagnosis not present

## 2015-02-22 DIAGNOSIS — I739 Peripheral vascular disease, unspecified: Secondary | ICD-10-CM | POA: Diagnosis not present

## 2015-02-22 DIAGNOSIS — Z951 Presence of aortocoronary bypass graft: Secondary | ICD-10-CM | POA: Diagnosis not present

## 2015-02-22 DIAGNOSIS — E785 Hyperlipidemia, unspecified: Secondary | ICD-10-CM | POA: Diagnosis not present

## 2015-02-22 DIAGNOSIS — I5042 Chronic combined systolic (congestive) and diastolic (congestive) heart failure: Secondary | ICD-10-CM | POA: Diagnosis not present

## 2015-02-22 DIAGNOSIS — I5022 Chronic systolic (congestive) heart failure: Secondary | ICD-10-CM | POA: Insufficient documentation

## 2015-02-22 MED ORDER — LOSARTAN POTASSIUM 50 MG PO TABS: 50.0000 mg | ORAL_TABLET | Freq: Two times a day (BID) | ORAL | Status: DC

## 2015-02-22 NOTE — Progress Notes (Signed)
Patient ID: [name], male   DOB: 1946-11-16, 69 y.o.   MRN: [id number] [facility]  02/22/2015 [name]   27-Sep-1946  [id number]  Primary Physicia [name], MD Hematologist: Dr Alen Blew  Pulmonary: Dr Annamaria Boots  HPI:  [name] is a pleasant 69 yo with a past medical history significant for morbid obesity, systolic heart failure with an EF of 30-35% (January 2014) -> 20-25% (March 2015 - RV normal), VT on amio, CAD s/p CABG 1996, LBBB, COPD, obstructive sleep apnea on CPAP, AAA repair (January 2015) who was admitted to [facility] on 02/24/14 for acute on chronic HF and acute COPD exacerbation. He also had iron deficiency anemia.   Admitted to [facility] July 24 through July 06 2014 with chest pain.  CEs negative. Had a LHC with no change from previous LHC with recommendations to continue medical management. Discharge weight was 255 pounds.   LHC 07/06/14 --No significant change from previous studies.  Left anterior descending (LAD): The LAD is a large vessel. There is a 40% stenosis immediately after the takeoff of a large septal perforator. There are 2 large diagonal branches without significant disease.  Left circumflex (LCx): The LCx is occluded proximally. There are left to left collaterals.  Right coronary artery (RCA): The RCA is occluded proximally immediately following the conus branch. There are right to right and left to right collaterals.  SVGs from CABG known to be totally occluded.   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   He returns for follow up. At a prior visit Lasix was switched to torsemide, however he continued on Lasix. Unable to get [employer] will not cover).  No chest pain.  He is short of breath if he walks "too fast."  He is watching diet and walking around his backyard for exercise.  No melena or BRBPR.  No claudication, no pedal ulcerations.  Now getting Fe infusions for Fe-deficiency anemia.  Weight is down 6 lbs.    CTA chest/abd/pelvis  (3/16) with moderate emphysema, 1.3 cm nodule RUL, left SFA totally occluded, right SFA with significant stenosis, s/p endovascular AAA repair (stable).   Labs 3/15 Cr 1.5 K 5.6 Labs 07/06/14 K 3.9 Creatinine  0.98 Labs 9/15 K 5.1, creatinine 0.96 Labs 11/15 K 3.8, creatinine 0.83, LDL 61, LFTs normal, TSH normal Labs 11/26/14 K 5.0 Creatinine 0.73  Labs 12/08/14 K 3.4 Creatinine 0.83  Labs 3/16 K 4.4, creatinine 1.03, LFTs normal, TSH normal, HCT 37.8  ECG: NSR, BiV paced  Past Medical History  Diagnosis Date  . Myocardial infarction   . Hypertension   . CHF (congestive heart failure)   . COPD (chronic obstructive pulmonary disease)   . GERD (gastroesophageal reflux disease)   . AAA (abdominal aortic aneurysm)     followed by Dr. Bridgett Larsson  . Tremor   . Dysrhythmia     ICD-defibrillator  . Carotid artery stenosis     LCEA - Dr. Bridgett Larsson in 2013  . Peripheral vascular disease     LCEA, L renal artery stent, bilat iliac stents, R SFA stenosis  . Adenomatous colon polyp 01/2004  . Diverticulosis   . AVM (arteriovenous malformation)   . CAD (coronary artery disease)     s/p CABGx2 in 1996  . HLD (hyperlipidemia)   . Hypothyroidism   . BPH (benign prostatic hypertrophy)   . Fatigue   . Automatic implantable cardioverter-defibrillator in situ   . OSA on CPAP     AHI durign total sleep  14.69/hr, during REM 50.91/hr  . H/O hiatal hernia   . Anemia     hx  . Complication of anesthesia     claustrophobic, unabe to lie on back more than 4 hours at time due to back  . Diabetes mellitus     DIET CONTROLLED  . Ischemic cardiomyopathy   . Ventricular tachycardia 09/09/2014    Amiodarone was started after appropriate defibrillator shocks for ventricular tachycardia in October 2008    Current Outpatient Prescriptions  Medication Sig Dispense Refill  . albuterol (PROVENTIL HFA;VENTOLIN HFA) 108 (90 BASE) MCG/ACT inhaler Inhale 2 puffs into the lungs every 6 (six) hours as needed for wheezing  or shortness of breath. 3 Inhaler 4  . albuterol (PROVENTIL) (2.5 MG/3ML) 0.083% nebulizer solution Take 3 mLs (2.5 mg total) by nebulization every 6 (six) hours as needed for wheezing or shortness of breath. 75 mL 12  . amiodarone (CORDARONE) 200 MG tablet Take 1 tablet (200 mg total) by mouth daily. 90 tablet 3  . aspirin 81 MG tablet Take 81 mg by mouth daily.    Marland Kitchen atorvastatin (LIPITOR) 40 MG tablet Take 1 tablet (40 mg total) by mouth daily. 90 tablet 3  . carvedilol (COREG) 25 MG tablet Take 1 tablet (25 mg total) by mouth 2 (two) times daily with a meal. 180 tablet 3  . clopidogrel (PLAVIX) 75 MG tablet Take 1 tablet (75 mg total) by mouth daily. 90 tablet 3  . finasteride (PROSCAR) 5 MG tablet Take 5 mg by mouth daily.    . furosemide (LASIX) 80 MG tablet Take 120 mg by mouth 2 (two) times daily.    . isosorbide mononitrate (IMDUR) 60 MG 24 hr tablet Take 1 tablet (60 mg total) by mouth daily. 60 tablet 4  . levothyroxine (SYNTHROID, LEVOTHROID) 50 MCG tablet Take 1 tablet (50 mcg total) by mouth daily before breakfast. 90 tablet 1  . losartan (COZAAR) 50 MG tablet Take 1 tablet (50 mg total) by mouth 2 (two) times daily. 60 tablet 6  . Multiple Vitamins-Minerals (MENS 50+ MULTI VITAMIN/MIN) TABS Take 1 tablet by mouth daily.     . multivitamin-lutein (OCUVITE-LUTEIN) CAPS Take 1 capsule by mouth daily.      Marland Kitchen NITROSTAT 0.4 MG SL tablet Dissolve 1 tablet under the tongue every 5 minutes as  needed for chest pain (up  to 3 tabs in 15 minutes  then call 911) 25 tablet 0  . NON FORMULARY at bedtime. CPAP And during the day as needed    . ranolazine (RANEXA) 500 MG 12 hr tablet Take 500 mg by mouth 2 (two) times daily.    Marland Kitchen spironolactone (ALDACTONE) 25 MG tablet Take 1 tablet (25 mg total) by mouth daily. 90 tablet 1   No current facility-administered medications for this encounter.    Allergies  Allergen Reactions  . Tussionex Pennkinetic Er [Hydrocod Polst-Cpm Polst Er] Other (See  Comments)    "caused prostate problems"  . Ace Inhibitors Other (See Comments)    REACTION: cough  . Penicillins Other (See Comments)    "childhood allergy"    History   Social History  . Marital Status: Married    Spouse Name: [name]  . Number of Children: 2  . Years of Education: [name]   Occupational History  . retired Solicitor     Social History Main Topics  . Smoking status: Former Smoker -- 1.50 packs/day for 40 years    Types: Cigarettes  Quit date: 12/11/2002  . Smokeless tobacco: Former Systems developer  . Alcohol Use: No  . Drug Use: No  . Sexual Activity: Not Currently   Other Topics Concern  . Not on file   Social History Narrative    Blood pressure 120/54, pulse 70, weight 272 lb (123.378 kg), SpO2 93 %.  General appearance: alert, cooperative and no distress Wife present.  Neck: Thick.  No carotid bruit and JVP 7 cm. L CEA scar Lungs: Decreased breath sounds throughout lung fields.  Heart: regular rate and rhythm, S1, S2 normal, 1/6 SEM at RUSB.  No S3/S4.  Extremities: 1+ edema to knees bilaterally.  No clubbing/cyanosis.  Skin: warm and dry Neurologic: Grossly normal  ASSESSMENT/PLAN:  1. Chronic systolic CHF: Ischemic cardiomyopathy. ECHO 02/2014 EF 20-25%.  [employer] CRT-D. NYHA III symptoms, stable. Likely combination of CHF and moderate COPD. Volume status look ok today.   - Continue lasix 120 mg twice a day. He did not start torsemide, so I will continue Lasix for now as volume appears controlled.    - Continue carvedilol 25 mg twice a day  - Continue losartan 25 mg bid (unable to get Entresto).  - Continue spironolactone 25 mg daily.  2. CAD s/p CABG: No chest pain. Continue 81 mg aspirin daily and atorvastatin daily.  Good lipids in 11/15.  3. VT s/p ICD: On amiodarone for history of ICD discharges due to VT.  Recent LFTs and TSH normal.  He should get yearly eye exams.  4. Morbid obesity: Trying to exercise more, weight down 6 lbs.    5. COPD: Moderate to severe.  Contributing to dyspnea.  6. PAD: Occluded left SFA and significant right SFA stenosis on recent CTA.  He does not report claudication and does not have pedal ulcers.  Followed by [facility]. He is on statin.   Followup 3 months  [name] 02/22/2015

## 2015-02-22 NOTE — Patient Instructions (Signed)
Increase Losartan to 50 mg Twice daily   Labs in 2 weeks (bmet, bnp)  We will contact you in 3 months to schedule your next appointment.

## 2015-02-23 ENCOUNTER — Ambulatory Visit (HOSPITAL_BASED_OUTPATIENT_CLINIC_OR_DEPARTMENT_OTHER): Payer: Medicare Other

## 2015-02-23 ENCOUNTER — Other Ambulatory Visit: Payer: Self-pay | Admitting: Oncology

## 2015-02-23 DIAGNOSIS — D509 Iron deficiency anemia, unspecified: Secondary | ICD-10-CM | POA: Diagnosis not present

## 2015-02-23 DIAGNOSIS — I255 Ischemic cardiomyopathy: Secondary | ICD-10-CM

## 2015-02-23 MED ORDER — SODIUM CHLORIDE 0.9 % IJ SOLN
3.0000 mL | Freq: Once | INTRAMUSCULAR | Status: DC | PRN
  Filled 2015-02-23: qty 10

## 2015-02-23 MED ORDER — SODIUM CHLORIDE 0.9 % IV SOLN
510.0000 mg | Freq: Once | INTRAVENOUS | Status: AC
  Administered 2015-02-23: 510 mg via INTRAVENOUS
  Filled 2015-02-23: qty 17

## 2015-02-23 MED ORDER — HEPARIN SOD (PORK) LOCK FLUSH 100 UNIT/ML IV SOLN
250.0000 [IU] | Freq: Once | INTRAVENOUS | Status: DC | PRN
  Filled 2015-02-23: qty 5

## 2015-02-23 MED ORDER — ALTEPLASE 2 MG IJ SOLR
2.0000 mg | Freq: Once | INTRAMUSCULAR | Status: DC | PRN
  Filled 2015-02-23: qty 2

## 2015-02-23 MED ORDER — SODIUM CHLORIDE 0.9 % IV SOLN
Freq: Once | INTRAVENOUS | Status: AC
  Administered 2015-02-23: 15:00:00 via INTRAVENOUS

## 2015-02-23 NOTE — Patient Instructions (Signed)

## 2015-02-24 DIAGNOSIS — H25812 Combined forms of age-related cataract, left eye: Secondary | ICD-10-CM | POA: Diagnosis not present

## 2015-02-24 DIAGNOSIS — H2512 Age-related nuclear cataract, left eye: Secondary | ICD-10-CM | POA: Diagnosis not present

## 2015-03-01 ENCOUNTER — Telehealth: Payer: Self-pay | Admitting: Vascular Surgery

## 2015-03-01 ENCOUNTER — Other Ambulatory Visit: Payer: Self-pay | Admitting: *Deleted

## 2015-03-01 DIAGNOSIS — R918 Other nonspecific abnormal finding of lung field: Secondary | ICD-10-CM

## 2015-03-01 NOTE — Telephone Encounter (Addendum)
-----   Message from Mena Goes, RN sent at 03/01/2015 11:19 AM EDT ----- Regarding: Schedule Pt has seen Dr. Baird Lyons at [facility] per EPIC notes.   ----- Message -----    From: Conrad Cresskill, MD    Sent: 03/01/2015  11:03 AM      To: [address]  [name] [id number] 1946/07/18  Please refer pt back to PCP or pulmonologist for follow up on RUL nodule on CT   03/01/15: LM for patient with appt for Dr Annamaria Boots on 03/23 @ 915. dpm

## 2015-03-02 ENCOUNTER — Ambulatory Visit (HOSPITAL_BASED_OUTPATIENT_CLINIC_OR_DEPARTMENT_OTHER): Payer: Medicare Other

## 2015-03-02 ENCOUNTER — Other Ambulatory Visit: Payer: Self-pay | Admitting: *Deleted

## 2015-03-02 DIAGNOSIS — D509 Iron deficiency anemia, unspecified: Secondary | ICD-10-CM | POA: Diagnosis present

## 2015-03-02 DIAGNOSIS — I255 Ischemic cardiomyopathy: Secondary | ICD-10-CM

## 2015-03-02 MED ORDER — SODIUM CHLORIDE 0.9 % IV SOLN
Freq: Once | INTRAVENOUS | Status: AC
  Administered 2015-03-02: 15:00:00 via INTRAVENOUS

## 2015-03-02 MED ORDER — SODIUM CHLORIDE 0.9 % IV SOLN
510.0000 mg | Freq: Once | INTRAVENOUS | Status: AC
  Administered 2015-03-02: 510 mg via INTRAVENOUS
  Filled 2015-03-02: qty 17

## 2015-03-02 NOTE — Patient Instructions (Signed)

## 2015-03-03 ENCOUNTER — Ambulatory Visit: Payer: Medicare Other | Admitting: Internal Medicine

## 2015-03-04 ENCOUNTER — Encounter: Payer: Self-pay | Admitting: Internal Medicine

## 2015-03-04 ENCOUNTER — Ambulatory Visit (INDEPENDENT_AMBULATORY_CARE_PROVIDER_SITE_OTHER): Payer: Medicare Other | Admitting: Internal Medicine

## 2015-03-04 VITALS — BP 102/74 | HR 65 | Ht 68.0 in | Wt 288.0 lb

## 2015-03-04 DIAGNOSIS — G4733 Obstructive sleep apnea (adult) (pediatric): Secondary | ICD-10-CM | POA: Diagnosis not present

## 2015-03-04 DIAGNOSIS — Z9989 Dependence on other enabling machines and devices: Principal | ICD-10-CM

## 2015-03-04 DIAGNOSIS — J449 Chronic obstructive pulmonary disease, unspecified: Secondary | ICD-10-CM

## 2015-03-04 DIAGNOSIS — I255 Ischemic cardiomyopathy: Secondary | ICD-10-CM | POA: Diagnosis not present

## 2015-03-04 DIAGNOSIS — R0600 Dyspnea, unspecified: Secondary | ICD-10-CM | POA: Diagnosis not present

## 2015-03-04 MED ORDER — UMECLIDINIUM-VILANTEROL 62.5-25 MCG/INH IN AEPB
1.0000 | INHALATION_SPRAY | Freq: Every day | RESPIRATORY_TRACT | Status: DC
Start: 2015-03-04 — End: 2015-07-27

## 2015-03-04 NOTE — Progress Notes (Signed)
06/03/12- 69 yoM former smoker seen in pulmonary consultation at the request of Dr. Ronnald Ramp. Per Dr. Ronnald Ramp. Patient c/o sob and difficultly breathing when sitting while talking and walking.  Denies chest pain, chest tightness, wheeizng, and cough. Diagnosed with COPD 10 years ago. Wife is here. COPD Assessment Test (CAT) scored 24/40. He had smoked a pack a day half per day until 2005. 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 He was then diagnosed with hypothyroidism. Since then he has not exercised regularly, has felt weak with easier dyspnea on exertion. He denies cough or wheeze. Little change from day to day. His wife says he gets short of breath climbing 10 steps and he has to stop at the top. He has gained weight since thyroid diagnosis and with lack of exercise. He indicates 40 pound weight gain in the past 2 years. Coronary artery disease with history of multiple myocardial infarctions, congestive heart failure, hypertension, implanted AICD in left chest. No history of asthma. Ankles swell. Diagnosed obstructive sleep apnea/Dr. Kelly/CPAP/[facility], unknown pressure. Anemia 4 years ago required transfusion. Uses Spiriva daily, occasional rescue Combivent./ MI/ AICD, OSA(Dr Kelly)  07/16/12- 69 yoM former smoker followed for dyspnea/ COPD, complicated by hx OSA/ Dr Claiborne Billings, CAD/ CHF/ MI/ AICD Wife is here today Found to be anemic and now treated with iron. Very short of breath over the weekend/blames weather, limiting dyspnea on exertion then but today feels "great". Using Spiriva with rare need for rescue albuterol. Says he is getting bronchitis less often than in the past. COPD assessment test (CAT) score 27/40 Complains of nasal stuffiness supine. Failed nasal strips. Sleeps sitting up because of this and uses saline nasal rinse. 6 minute walk test 07/16/2012: 95%, 97%, 98%, 216 m-ended test labored  for breath and fatigue but without desaturation. CXR 06/10/12 reviewed with them-bronchitis changes, CE/ASVD/CABG IMPRESSION:  1. Diffuse peribronchial cuffing. Given the patient's history of  prior smoking, this may suggest chronic bronchitis.  2. In addition, there is mild cardiomegaly with pulmonary venous  congestion, but no frank pulmonary edema at this time.  3. Status post median sternotomy for CABG.  4. Atherosclerosis.  Original Report Authenticated By: Etheleen Mayhew, M.D.  PFT: 07/16/2012-moderate to severe obstructive airways disease with air trapping, diffusion mildly reduced. FEV1/FVC 0.49. Insignificant response to bronchodilator. RV 128%, DLCO 69%.   12/20/12-  69 yoM former smoker followed for dyspnea/ COPD, complicated by hx OSA/ Dr Claiborne Billings, CAD/ CHF/ MI/ AICD     Wife here FOLLOWS FOR: breathing is unchanged since last OV. denies chest pain, chest tightness or coughing. reports increased SOB with extertion. Probably no change in dyspnea with exertion when he thinks in terms of the impact on ADLs. He says he joined weight watchers last night and plans to lose 100 pounds. His cardiologist has increased his diuretic. No routine cough, phlegm or wheeze. CXR 06/10/12 IMPRESSION:  1. Diffuse peribronchial cuffing. Given the patient's history of  prior smoking, this may suggest chronic bronchitis.  2. In addition, there is mild cardiomegaly with pulmonary venous  congestion, but no frank pulmonary edema at this time.  3. Status post median sternotomy for CABG.  4. Atherosclerosis.  Original Report Authenticated By: Etheleen Mayhew, M.D.  03/11/14- 69 yoM former smoker followed for dyspnea/ COPD, complicated by hx OSA/ Dr Claiborne Billings, CAD/ CHF/ MI/ AICD     Wife here Presents with SOB with any  exertion, chest congestion, nonprod cough.  Cardiologist recommended this visit yesterday.  Wears cpap [facility] nightly, 12 hours/night as well as during day.  Using rescue inhaler and  Spiriva. Has not qualified for home oxygen. 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 In early March and rhonchi this with green sputum. Office spirometry 03/11/14- weak/submaximal effort. By the numbers-severe obstructive airways disease and restriction of exhaled volume. FVC 2.49/59%, FEV1 1.29/89%, FEV1/FEC 0.5 to, FEF 25-75% 0.82/27%. CXR 03/02/14 IMPRESSION:  Improved lung aeration with resolving edema and atelectasis. No  pleural effusion.  Electronically Signed  By: Kalman Jewels M.D.  On: 03/02/2014 09:59  05/12/14- 69 yoM former smoker followed for dyspnea/ COPD, complicated by hx OSA/ Dr Claiborne Billings, CAD/ CHF/ MI/ AICD/CM/ AAA     Wife here FOLLOWS FOR: Wears CPAP/ [facility]/managed by Dr Claiborne Billings, every night for about 8-10 hours; DME is [facility]. Machine 70 yrs old. Patient would like me to manage CPAP/ sleep. Breathing well with nebs. Easy DOE. No cough on lisinopril.  03/04/15- 69 yoM former smoker followed for dyspnea/ COPD, complicated by hx OSA/ Dr Claiborne Billings, CAD/ CHF/ MI/ AICD/CM/ AAA     Wife here  CPAP/ [facility]/managed here FOLLOWS FOR wear CPAP every night for 8-10 hours. DME is [facility]. C/O increased SOB especially with movement  Uncertain pressure. He and wife both report that he uses CPAP all night and anytime he naps. Machine is now quite old. He questions if another setting would be better. CT abdomen from 02/19/2015 showed incidental right upper lobe 1.3 cm groundglass density with follow-up chest CT scheduled for 03/19/2015. Home oxygen saturations running 96-99% even though he feels short of breath with exertion he uses nebulizer or metered inhaler 3 or 4 times daily, for breathlessness without much wheezing. Walk test 03/04/15- did not desaturate  ROS-see HPI Constitutional:   No-   weight loss, night sweats, fevers, chills, fatigue, lassitude. HEENT:   No-  headaches, difficulty swallowing, tooth/dental problems, sore throat,        No-  sneezing, itching, ear ache, nasal congestion, post nasal drip,  CV:  No-   chest pain, +orthopnea, PND, +swelling in lower extremities, No-anasarca, dizziness, palpitations Resp: + shortness of breath with exertion or at rest.              No-   productive cough,  No non-productive cough,  No- coughing up of blood.              No-   change in color of mucus.  No- wheezing.   Skin: No-   rash or lesions. GI:  No-   heartburn, indigestion, abdominal pain, nausea, vomiting, GU: . MS:  + joint pain or swelling.  Neuro-     nothing unusual Psych:  No- change in mood or affect. No depression or anxiety.  No memory loss.  OBJ- Physical Exam   94% at rest room air General- Alert, Oriented, Affect-appropriate, Distress- none acute, +obese, talkative Skin- clear Lymphadenopathy- none Head- atraumatic            Eyes- Gross vision intact, PERRLA, conjunctivae and secretions clear            Ears- Hearing, canals-normal            Nose- +turbinate edema, no-Septal dev, mucus, polyps, erosion, perforation             Throat- Mallampati III, mucosa clear , drainage- none, tonsils- atrophic  Neck- flexible , trachea midline, no stridor , thyroid nl, carotid no bruit Chest - symmetrical excursion , unlabored           Heart/CV- RRR , no murmur , no gallop  , no rub, nl s1 s2                           - JVD- none , edema 1+, stasis changes- none, varices- none           Lung- clear to P&A shallow inspiratory effort, rales- none, wheeze- none, cough- none ,  dullness-none, rub- none,            Chest wall- sternotomy scar,  Pacemaker defibrillator L Abd-  Br/ Gen/ Rectal- Not done, not indicated Extrem- cyanosis- none, clubbing, none, atrophy- none, strength- nl, vein donor scars Neuro- head bob tremor

## 2015-03-04 NOTE — Patient Instructions (Addendum)
Order DME [facility]- download old CPAP machine for pressure setting and apply that setting to the new CPAP machine. Then download the new machine for pressure compliance.  Sample Anoro Ellipta inhaler  1 puff once daily  Walk test room air for saturation   Dx dyspnea on exertion  Order DME [facility]- ONOX on CPAP on room air    Dx dyspnea

## 2015-03-07 NOTE — Assessment & Plan Note (Signed)
Known COPD, but his dyspnea on exertion probably reflects his heart disease, obesity and deconditioning as well. Plan-sample Anoro inhaler for therapeutic trial

## 2015-03-07 NOTE — Assessment & Plan Note (Signed)
He is due for replacement of a 70 year old machine and we also need to know the pressure. Plan-DME advanced to download his old machine for pressure and set a new machine at that setting for subsequent download.

## 2015-03-10 ENCOUNTER — Ambulatory Visit (HOSPITAL_COMMUNITY)
Admission: RE | Admit: 2015-03-10 | Discharge: 2015-03-10 | Disposition: A | Discharge status: Home / Self Care | Payer: Medicare Other | Source: Ambulatory Visit | Attending: Internal Medicine | Admitting: Internal Medicine

## 2015-03-10 DIAGNOSIS — R0600 Dyspnea, unspecified: Secondary | ICD-10-CM | POA: Diagnosis not present

## 2015-03-10 DIAGNOSIS — I5042 Chronic combined systolic (congestive) and diastolic (congestive) heart failure: Secondary | ICD-10-CM

## 2015-03-10 DIAGNOSIS — I5022 Chronic systolic (congestive) heart failure: Secondary | ICD-10-CM

## 2015-03-10 LAB — BASIC METABOLIC PANEL
Anion gap: 6 (ref 5–15)
BUN: 21 mg/dL (ref 6–23)
CO2: 31 mmol/L (ref 19–32)
Calcium: 9.6 mg/dL (ref 8.4–10.5)
Chloride: 107 mmol/L (ref 96–112)
Creatinine, Ser: 1.09 mg/dL (ref 0.50–1.35)
GFR calc Af Amer: 77 mL/min — ABNORMAL LOW (ref >90)
GFR calc non Af Amer: 67 mL/min — ABNORMAL LOW (ref >90)
Glucose, Bld: 115 mg/dL — ABNORMAL HIGH (ref 70–99)
Potassium: 4.1 mmol/L (ref 3.5–5.1)
Sodium: 144 mmol/L (ref 135–145)

## 2015-03-10 LAB — BRAIN NATRIURETIC PEPTIDE: B Natriuretic Peptide: 65.2 pg/mL (ref 0.0–100.0)

## 2015-03-19 ENCOUNTER — Ambulatory Visit
Admission: RE | Admit: 2015-03-19 | Discharge: 2015-03-19 | Disposition: A | Discharge status: Home / Self Care | Payer: Medicare Other | Source: Ambulatory Visit | Attending: Vascular Surgery | Admitting: Vascular Surgery

## 2015-03-19 ENCOUNTER — Ambulatory Visit: Payer: Medicare Other | Admitting: Vascular Surgery

## 2015-03-19 DIAGNOSIS — R911 Solitary pulmonary nodule: Secondary | ICD-10-CM

## 2015-03-19 DIAGNOSIS — J432 Centrilobular emphysema: Secondary | ICD-10-CM | POA: Diagnosis not present

## 2015-03-19 MED ORDER — IOPAMIDOL (ISOVUE-300) INJECTION 61%
75.0000 mL | Freq: Once | INTRAVENOUS | Status: AC | PRN
Start: 2015-03-19 — End: 2015-03-19
  Administered 2015-03-19: 75 mL via INTRAVENOUS

## 2015-04-01 ENCOUNTER — Encounter: Payer: Self-pay | Admitting: Vascular Surgery

## 2015-04-02 ENCOUNTER — Encounter: Payer: Self-pay | Admitting: Vascular Surgery

## 2015-04-02 ENCOUNTER — Ambulatory Visit (INDEPENDENT_AMBULATORY_CARE_PROVIDER_SITE_OTHER): Payer: Medicare Other | Admitting: Vascular Surgery

## 2015-04-02 VITALS — BP 126/62 | HR 59 | Ht 68.0 in | Wt 270.5 lb

## 2015-04-02 DIAGNOSIS — R918 Other nonspecific abnormal finding of lung field: Secondary | ICD-10-CM | POA: Diagnosis not present

## 2015-04-02 DIAGNOSIS — I714 Abdominal aortic aneurysm, without rupture, unspecified: Secondary | ICD-10-CM | POA: Insufficient documentation

## 2015-04-02 DIAGNOSIS — I255 Ischemic cardiomyopathy: Secondary | ICD-10-CM

## 2015-04-02 DIAGNOSIS — I6523 Occlusion and stenosis of bilateral carotid arteries: Secondary | ICD-10-CM

## 2015-04-02 DIAGNOSIS — Z48812 Encounter for surgical aftercare following surgery on the circulatory system: Secondary | ICD-10-CM | POA: Diagnosis not present

## 2015-04-02 NOTE — Progress Notes (Addendum)
Established EVAR and Carotid Diesase  History of Present Illness  [name] is a 69 y.o. (1946-04-28) male who presents for routine follow up s/p EVAR (Date: 01/06/14) and L CEA (01/04/12).  Most recent EVAR duplex (Date: 08/21/14) demonstrates: no endoleak and decreasing sac size.  Most recent CTA (Date: 02/19/15) demonstrates: no endoleak and decreased sac size.  The patient has had no recent back or abdominal pain.  Pt also had a CT Chest recent for lung nodule found on CTA.  Pt has been readmitted to hospital since last visit for CHF exacerbation.  Pt can only walk ~100 yd before getting dyspnea, felt due to his CHF.  The patient's PMH, PSH, SH, FamHx, Med, and Allergies are unchanged from 08/21/14.  On ROS today: no intermittent claudication , ambulation limited by his CHF  Physical Examination  Filed Vitals:   04/02/15 1002  BP: 126/62  Pulse: 59  Height: '5\' 8"'$  (1.727 m)  Weight: 270 lb 8 oz (122.698 kg)  SpO2: 95%   Body mass index is 41.14 kg/(m^2).  General: A&O x 3, WD, Obese,   Pulmonary: Sym exp, good air movt, improved from prior exam, CTAB, no rales, rhonchi, & wheezing  Cardiac: RRR, Nl S1, S2, no Murmurs, rubs or gallops  Vascular: Vessel Right Left  Radial Palpable Palpable  Brachial Palpable Palpable  Carotid Palpable, without bruit Palpable, without bruit  Aorta Not palpable [name]  Femoral Palpable Palpable  Popliteal Not palpable Not palpable  PT Not Palpable Faintly Palpable  DP Not Palpable Faintly Palpable   Gastrointestinal: soft, NTND, -G/R, - HSM, - masses, - CVAT B  Musculoskeletal: M/S 5/5 throughout , Extremities without ischemic changes   Neurologic: Pain and light touch intact in extremities , Motor exam as listed above  CTA Abd/Pelvis Duplex (Date: 02/19/15) 1. The left superficial femoral artery is occluded throughout its imaged course - this represents an interval change since the 02/13/2014 examination. 2. Stable sequela of  endovascular repair of infrarenal abdominal aortic aneurysm without evidence of complication. Minimal (approximately 3-4 mm) decrease in size of the now excluded native abdominal aortic aneurysm sac. No evidence of endoleak.  3. Unchanged moderate narrowing (approximately 50%) of the left iliac limb of the stent graft secondary to mass effect from the overlying right iliac limb, unchanged since the 02/2014 examination. 4. Suspected hemodynamically significant narrowing involving the origin of the right superficial femoral artery. 5. Left renal arterial stent appears widely patent.  Nonvascular Impression:  1. Moderate centrilobular emphysematous change. 2. Indeterminate approximately 1.3 cm ground-glass nodule within the right upper lobe - comparison with prior outside examinations (if available) is recommended. If no comparisons exists, further evaluation with dedicated chest CT in 4 to 6 weeks is recommended to ensure stability and/or resolution.  I reviewed this CTA abd/pelvis.  The endograft appears to be in good position without evidence of limb dysfunction.  This patient has baseline 7 cm stents in both common iliac artery so the left limb stenosis is likely that stent constraining the limb.  The sac appears to have no endoleak and appears smaller than the prior 5.7 cm.  Chest CT (03/19/15) 1. Short interval stability of a solitary, partially solid and sub-solid right upper lung lesion. The solid component is greater than 5 mm. Still, persistence for 3 months has yet to be confirmed by CT. Recommend thoracic surgery consultation and/or repeat chest CT in 3 months. 2. Centrilobular emphysema. No mediastinal lymphadenopathy.   Medical Decision Making  [name] is a 69 y.o. male who presents s/p EVAR.  Pt is asymptomatic with decreasing sac size, RUL lung nodule, CHF, s/p L CEA, R SFA occlusion   I discussed with the patient the importance of surveillance of the endograft.  The  next endograft duplex will be scheduled for 6 months.  Will likely not repeat CTA until 5 years from procedure to look for late type II endoleak.  Additionally, patient has been lost to follow for his carotid surveillance so will schedule B carotid duplex in 6 months also.  The patient will follow up with [place] in 6 months with these studies.  In regards to the RUL nodule, I am going to refer the patient to thoracic surgery for follow up on such if it requiring biopsy in the future.  In regards to the R SFA occlusion, the pt is asx due to his CHF, so I would not pursue this except to document it with a BLE ABI in 6 months.   I doubt he will be candidate for intervention given the severity of his CHF sx.  Thank you for allowing [place] to participate in this patient's care.  Adele Barthel, MD [facility] Office: [phone] Pager: [phone]  04/02/2015, 10:37 AM

## 2015-04-06 NOTE — Addendum Note (Signed)
Addended by: Mena Goes on: 04/06/2015 03:42 PM   Modules accepted: Orders

## 2015-04-12 ENCOUNTER — Institutional Professional Consult (permissible substitution) (INDEPENDENT_AMBULATORY_CARE_PROVIDER_SITE_OTHER): Payer: Medicare Other | Admitting: Thoracic Surgery (Cardiothoracic Vascular Surgery)

## 2015-04-12 ENCOUNTER — Encounter: Payer: Self-pay | Admitting: Thoracic Surgery (Cardiothoracic Vascular Surgery)

## 2015-04-12 VITALS — BP 116/54 | HR 67 | Resp 20 | Ht 68.0 in | Wt 270.0 lb

## 2015-04-12 DIAGNOSIS — I5042 Chronic combined systolic (congestive) and diastolic (congestive) heart failure: Secondary | ICD-10-CM

## 2015-04-12 DIAGNOSIS — I255 Ischemic cardiomyopathy: Secondary | ICD-10-CM | POA: Diagnosis not present

## 2015-04-12 DIAGNOSIS — J449 Chronic obstructive pulmonary disease, unspecified: Secondary | ICD-10-CM

## 2015-04-12 DIAGNOSIS — R911 Solitary pulmonary nodule: Secondary | ICD-10-CM

## 2015-04-12 NOTE — Progress Notes (Signed)
PCP is [name], MD Referring Provider is Conrad Waterville, MD  Chief Complaint  Patient presents with  . Lung Lesion    Surgical eval on RUL nodule, Chest CT 03/19/15    HPI: 69 yo maformer smoker sent for consultation re: a new right lung nodule.  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 His PMH is complicated and includes CHF, CAD, s/ CABG, s/p multiple stents, VT, ICD, PVD, hypertension, hyperlipidemia, severe COPD, morbid obesity, sleep apnea, and an AAA. He recently had a CT for follow up of an aortic endograft. There was an incidentally noted ground glass nodule centrally in the right upper lobe. A dedicated CT of the chest was done which again showed the nodule. There was no hilar or mediastinal adenopathy.  He has COPD and CHF. His CHF seems to be the most limiting factor. He gets SOB with walking < 100 feet. He can get up one flight of stairs if he "pulls himself up" but is very SOB after doing so. He has lost 10 pounds over the past 3 months by diet. He denies cough, hemoptysis, wheezing, orthopnea and PND.   Zubrod Score: At the time of surgery this patient's most appropriate activity status/level should be described as: '[]'$     0    Normal activity, no symptoms '[]'$     1    Restricted in physical strenuous activity but ambulatory, able to do out light work '[x]'$     2    Ambulatory and capable of self care, unable to do work activities, up and about >50 % of waking hours                              '[]'$     3    Only limited self care, in bed greater than 50% of waking hours '[]'$     4    Completely disabled, no self care, confined to bed or chair '[]'$     5    Moribund    Past Medical History  Diagnosis Date  . Myocardial infarction   . Hypertension   . CHF (congestive heart failure)   . COPD (chronic obstructive pulmonary disease)   . GERD (gastroesophageal reflux disease)   . AAA (abdominal aortic aneurysm)     followed by Dr. Bridgett Larsson  .  Tremor   . Dysrhythmia     ICD-defibrillator  . Carotid artery stenosis     LCEA - Dr. Bridgett Larsson in 2013  . Peripheral vascular disease     LCEA, L renal artery stent, bilat iliac stents, R SFA stenosis  . Adenomatous colon polyp 01/2004  . Diverticulosis   . AVM (arteriovenous malformation)   . CAD (coronary artery disease)     s/p CABGx2 in 1996  . HLD (hyperlipidemia)   . Hypothyroidism   . BPH (benign prostatic hypertrophy)   . Fatigue   . Automatic implantable cardioverter-defibrillator in situ   . OSA on CPAP     AHI durign total sleep 14.69/hr, during REM 50.91/hr  . H/O hiatal hernia   . Anemia     hx  . Complication of anesthesia     claustrophobic, unabe to lie on back more than 4 hours at time due to back  . Ischemic cardiomyopathy   . Ventricular tachycardia 09/09/2014    Amiodarone was started after appropriate defibrillator shocks for ventricular tachycardia in October  2008  . Diabetes mellitus     DIET CONTROLLED- pt states that this was a misdiagnosis, he was treated while in the hospital  but returned home, loss a massive amount of weight and he has not had a problem with his blood sugar since. States everything has been normal for about 3 years.    Past Surgical History  Procedure Laterality Date  . Coronary artery bypass graft  11/04/1985    x2 - PDA and sequential DX-OM (Dr. Redmond Pulling)  . Cardiac catheterization  09/16/2007    occlusion of both vein grafts, no significant LAD disease or diagonal disease, Cfx collaterals from the left, 70% in-stent restenosis of L renal artery, normal L main, RCA occluded ostially (Dr. Adora Fridge)  . Coronary angioplasty  01/08/2004    cutting balloon atherectomy & percutaneous intervention of RCIA in-stent restenosis (Dr. Marella Chimes)  . Renal artery stent Left 03/24/2004    6x11m Genesis stent (Dr. RMarella Chimes  . Angioplasty      BILATERAL  LE  W/STENTS  . Endarterectomy  01/04/2012    Procedure: ENDARTERECTOMY CAROTID;left   Surgeon: BHinda Lenis MD;  [place] [facility]  Service: Vascular;  Laterality: Left;  with patch angioplasty  . Carotid endarterectomy Left 01/04/12  . Cardiac defibrillator placement  06/2005    Guidant Vitality HE - ischemic cardiomyopathy - Dr. RMarella Chimes . Iron infusion  June 16, 2012  . Abdominal aortic endovascular stent graft [name] 01/06/2014    Procedure: ABDOMINAL AORTIC ENDOVASCULAR STENT GRAFT- GORE; ULTRASOUND GUIDED;  Surgeon: BConrad Lafayette MD;  [place] [facility]  Service: Vascular;  Laterality: [name];  . Polypectomy    . Iliac artery stent Bilateral 03/1997    and L SFA PTA  . Icd generator change  05/02/2010    [employer] . Transthoracic echocardiogram  12/2012    EF 30-35; LV mod to severely dilated, mod concentric hypertrophy, severe hypokinesis of inferolateral myocardium, moderate hypokineis of anteroseptal region, grade 1 diastolic dysfunction; mod MR; LA mod-severely dialted; RV mod dialted; RA mildly dilated; PA peak pressure 323mg  . Nm myocar perf wall motion  09/2012    lexiscan myoview - mod-severe perfusion defect r/t infarct or scar w/mild periinfarct ishcemia in basal inferior, mid inferior, apical inferior, basal inferolateral & mid inferoalteral regions - EF 21% low risk scan  . Cardiac catheterization  10/03/2002    SVG sequentially to OM1 & OM2 totally occluded at ostium, SVG to PDA totally occluded within previously placed prox vein graft stent, smal distal AAA, bialt iliac stents with 30% left end-stent restenosis and 50% right end-stent restnosis(Dr. R.Gerrie Nordmann . Cardiac catheterization  06/11/1998    L main with 20% narrowing in distal 1/3; LAD with 1st diagonla having 70% ostial narrowing, 2nd diagonal with 40% narrowing in prox third, LIMA & RIMA widely patent; in-stent restenosis of RCIA with successful PTA and new prox SVTRCA stent for residual disease (Dr. R.Marella Chimes . Biv icd genertaor change out  10/09/2014    UPGRADE TO BIV        BY  DR TALovena Le. Left heart catheterization with coronary angiogram [name] 07/06/2014    Procedure: LEFT HEART CATHETERIZATION WITH CORONARY ANGIOGRAM;  Surgeon: Peter M JoMartiniqueMD;  [facility];  Service: Cardiovascular;  Laterality: [name];  . Bi-ventricular implantable cardioverter defibrillator upgrade [name] 10/09/2014    Procedure: BI-VENTRICULAR IMPLANTABLE CARDIOVERTER DEFIBRILLATOR UPGRADE;  Surgeon: GrEvans LanceMD;  [facility];  Service: Cardiovascular;  Laterality: [name];    Family History  Problem Relation Age of Onset  . Colon cancer Sister   . Cancer Sister   . Hypertension Sister   . Hyperlipidemia Sister   . Diabetes Sister   . Heart disease Sister   . Heart disease Brother   . Lung cancer Mother   . Cancer Mother   . Diabetes Mother   . Hypertension Mother   . Hyperlipidemia Mother   . Heart disease Mother   . Heart attack Maternal Grandmother   . Colon polyps Sister   . Heart attack Father   . Heart disease Father   . Hypertension Father   . COPD Sister   . Diabetes Daughter     Social History History  Substance Use Topics  . Smoking status: Former Smoker -- 1.50 packs/day for 40 years    Types: Cigarettes    Quit date: 12/11/2002  . Smokeless tobacco: Former Systems developer  . Alcohol Use: No    Current Outpatient Prescriptions  Medication Sig Dispense Refill  . albuterol (PROVENTIL HFA;VENTOLIN HFA) 108 (90 BASE) MCG/ACT inhaler Inhale 2 puffs into the lungs every 6 (six) hours as needed for wheezing or shortness of breath. 3 Inhaler 4  . albuterol (PROVENTIL) (2.5 MG/3ML) 0.083% nebulizer solution Take 3 mLs (2.5 mg total) by nebulization every 6 (six) hours as needed for wheezing or shortness of breath. 75 mL 12  . amiodarone (CORDARONE) 200 MG tablet Take 1 tablet (200 mg total) by mouth daily. 90 tablet 3  . aspirin 81 MG tablet Take 81 mg by mouth daily.    Marland Kitchen atorvastatin (LIPITOR) 40 MG tablet Take 1 tablet (40 mg total) by mouth daily. 90 tablet  3  . carvedilol (COREG) 25 MG tablet Take 1 tablet (25 mg total) by mouth 2 (two) times daily with a meal. 180 tablet 3  . clopidogrel (PLAVIX) 75 MG tablet Take 1 tablet (75 mg total) by mouth daily. 90 tablet 3  . finasteride (PROSCAR) 5 MG tablet Take 5 mg by mouth daily.    . furosemide (LASIX) 40 MG tablet     . furosemide (LASIX) 80 MG tablet Take 120 mg by mouth 2 (two) times daily.    . isosorbide mononitrate (IMDUR) 60 MG 24 hr tablet Take 1 tablet (60 mg total) by mouth daily. 60 tablet 4  . levothyroxine (SYNTHROID, LEVOTHROID) 50 MCG tablet Take 1 tablet (50 mcg total) by mouth daily before breakfast. 90 tablet 1  . losartan (COZAAR) 50 MG tablet Take 1 tablet (50 mg total) by mouth 2 (two) times daily. 60 tablet 6  . Multiple Vitamins-Minerals (MENS 50+ MULTI VITAMIN/MIN) TABS Take 1 tablet by mouth daily.     . multivitamin-lutein (OCUVITE-LUTEIN) CAPS Take 1 capsule by mouth daily.      Marland Kitchen NITROSTAT 0.4 MG SL tablet Dissolve 1 tablet under the tongue every 5 minutes as  needed for chest pain (up  to 3 tabs in 15 minutes  then call 911) 25 tablet 0  . NON FORMULARY at bedtime. CPAP And during the day as needed    . prednisoLONE acetate (PRED FORTE) 1 % ophthalmic suspension     . PROLENSA 0.07 % SOLN     . spironolactone (ALDACTONE) 25 MG tablet Take 1 tablet (25 mg total) by mouth daily. 90 tablet 1  . Umeclidinium-Vilanterol (ANORO ELLIPTA) 62.5-25 MCG/INH AEPB Inhale 1 puff into the lungs daily. 14 each 0  No current facility-administered medications for this visit.    Allergies  Allergen Reactions  . Tussionex Pennkinetic Er [Hydrocod Polst-Cpm Polst Er] Other (See Comments)    "caused prostate problems"  . Ace Inhibitors Other (See Comments)    REACTION: cough  . Penicillins Other (See Comments)    "childhood allergy"    Review of Systems  Constitutional: Positive for activity change and fatigue. Negative for fever, chills and unexpected weight change (lost 10  pounds over 3 months intentionally).  Respiratory: Positive for apnea and shortness of breath (with exertion). Negative for cough and wheezing.   Cardiovascular: Positive for leg swelling. Negative for chest pain.  Gastrointestinal:       Difficulty swallowing, reflux, hiatal hernia  Musculoskeletal:       Leg cramps  Skin: Positive for rash.  Neurological:       Memory loss  Hematological: Negative for adenopathy. Bruises/bleeds easily.  All other systems reviewed and are negative.   BP 116/54 mmHg  Pulse 67  Resp 20  Ht '5\' 8"'$  (1.727 m)  Wt 270 lb (122.471 kg)  BMI 41.06 kg/m2  SpO2 94% Physical Exam  Constitutional: He is oriented to person, place, and time. No distress.  obese  HENT:  Head: Normocephalic and atraumatic.  Eyes: EOM are normal. Pupils are equal, round, and reactive to light.  Neck: Neck supple. No thyromegaly present.  Cardiovascular: Normal rate and regular rhythm.   Murmur (faint systolic) heard. Pulmonary/Chest: He has no wheezes.  Diminished BS bilaterally  Abdominal: Soft. There is no tenderness.  Musculoskeletal: He exhibits edema (2+).  Lymphadenopathy:    He has no cervical adenopathy.  Neurological: He is alert and oriented to person, place, and time. No cranial nerve deficit.  Skin: Skin is warm and dry.  Vitals reviewed.    Diagnostic Tests: CT CHEST WITH CONTRAST  TECHNIQUE: Multidetector CT imaging of the chest was performed during intravenous contrast administration.  CONTRAST: 75 mL Isovue-300  COMPARISON: CTA abdomen and pelvis 02/19/2015 and earlier. No prior chest CT.  FINDINGS: The peri-bronchovascular right upper lobe nodular / spiculated area persists and is sponge in size and configuration since March. See series 4, image 22. This is a solitary part solid lesion, with a ground-glass component encompassing 19 x 10 x 14 mm, and a centrally solid component up to 8-10 mm. The lesion is adjacent to a right upper lobe  airway (seen on series 4, images 23 and 24).  Underlying centrilobular emphysema. Mild posterior apical scarring on the right. No other discrete lung lesion. Major airways are patent.  Sequelae of CABG. No pericardial effusion. No pleural effusion. No mediastinal lymphadenopathy. Left chest cardiac AICD. Calcified atherosclerosis of the aorta and coronary arteries. No axillary lymphadenopathy.  Stable visualized upper abdominal viscera. The very top of the abdominal aortic endograft is visible.  No acute or suspicious osseous lesion identified.  IMPRESSION: 1. Short interval stability of a solitary, partially solid and sub-solid right upper lung lesion. The solid component is greater than 5 mm. Still, persistence for 3 months has yet to be confirmed by CT. Recommend thoracic surgery consultation and/or repeat chest CT in 3 months. 2. Centrilobular emphysema. No mediastinal lymphadenopathy.   Electronically Signed  By: Genevie Ann M.D.  On: 03/19/2015 10:57  Impression: Mr. Carton is a 68 yo man with multiple medical problems and a history of tobacco abuse. He has a newly discovered ground glass nodule in the right upper lobe. There may be a solid component as  well.   I personally reviewed his CT images and reviewed them with Mr and Mrs Rallis as well. We discussed the findings and the differential diagnosis. They understand this could be infectious, inflammatory or malignant in nature. We discussed that since this was a GGO, we should follow it for 3 months to make sure it doesn't resolve on its own.  If it does resolve, no further follow up would be needed. If it remains stable or increases in size, we will discuss more aggressive measures.  This lesion would be amenable to biopsy by navigational bronchoscopy if necessary, but even that carries risk given his medical condition.  He would not be a candidate for surgical resection, but would likely be one for  SBRT  Plan: Repeat CT in 3 months.  I will see him back at that time  [name], MD [facility] [phone]

## 2015-04-27 ENCOUNTER — Encounter: Payer: Self-pay | Admitting: Internal Medicine

## 2015-04-27 ENCOUNTER — Ambulatory Visit (INDEPENDENT_AMBULATORY_CARE_PROVIDER_SITE_OTHER): Payer: Medicare Other | Admitting: Internal Medicine

## 2015-04-27 VITALS — BP 128/60 | HR 62 | Temp 97.7°F | Resp 17 | Wt 277.0 lb

## 2015-04-27 DIAGNOSIS — J069 Acute upper respiratory infection, unspecified: Secondary | ICD-10-CM

## 2015-04-27 DIAGNOSIS — I255 Ischemic cardiomyopathy: Secondary | ICD-10-CM | POA: Diagnosis not present

## 2015-04-27 DIAGNOSIS — J440 Chronic obstructive pulmonary disease with acute lower respiratory infection: Secondary | ICD-10-CM

## 2015-04-27 MED ORDER — SULFAMETHOXAZOLE-TRIMETHOPRIM 800-160 MG PO TABS: 1.0000 | ORAL_TABLET | Freq: Two times a day (BID) | ORAL | Status: DC

## 2015-04-27 MED ORDER — PREDNISONE 10 MG PO TABS: ORAL_TABLET | ORAL | Status: DC

## 2015-04-27 MED ORDER — PREDNISONE 10 MG PO TABS: 10.0000 mg | ORAL_TABLET | Freq: Every day | ORAL | Status: DC

## 2015-04-27 MED ORDER — BENZONATATE 200 MG PO CAPS: 200.0000 mg | ORAL_CAPSULE | Freq: Three times a day (TID) | ORAL | Status: DC | PRN

## 2015-04-27 NOTE — Progress Notes (Signed)
   Subjective:    Patient ID: [name], male    DOB: 06-29-1946, 69 y.o.   MRN: [id number]  HPI   Symptoms began 04/24/15 as a sore throat and cough which has progressed. The cough is productive of yellow/green sputum. The cough is disturbing his sleep. He has associated sinus congestion and right maxillary sinus pain. There is nasal obstruction. He does have nasal purulence which is yellow/green. He describes associated fatigue. He's been using over-the-counter cough medicine with minimal benefit. He's been using albuterol 2 puffs twice a day and using his nebulizer once a day.  Review of Systems  He denies fever or sweats. He has had some chills. He is dyspneic at rest which is a chronic issue. Extrinsic symptoms of itchy, watery eyes, sneezing, or angioedema are denied.       Objective:   Physical Exam  Pertinent or positive findings include: Alopecia is present. He has a mustache.  Tympanic membranes are dull with slight scarring. There is markedly decreased breath sounds in all lung fields.  General appearance :adequately nourished; in no distress. BMI:42.13 Eyes: No conjunctival inflammation or scleral icterus is present. Oral exam:  Lips and gums are healthy appearing.There is no oropharyngeal erythema or exudate noted. Dental hygiene is good. Heart:  Normal rate and regular rhythm. S1 and S2 normal without gallop, murmur, click, rub or other extra sounds   Abdomen: bowel sounds normal, soft and non-tender without masses, organomegaly or hernias noted.  No guarding or rebound.  Vascular : all pulses equal ; no bruits present. Skin:Warm & dry.  Intact without suspicious lesions or rashes ; no tenting or jaundice  Lymphatic: No lymphadenopathy is noted about the head, neck, axilla Neuro: Strength, tone  normal.          Assessment & Plan:  #1 COPD exacerbation with acute LRI #2 URI Plan: See orders and recommendations

## 2015-04-27 NOTE — Progress Notes (Signed)
Pre visit review using our clinic review tool, if applicable. No additional management support is needed unless otherwise documented below in the visit note. 

## 2015-04-27 NOTE — Patient Instructions (Addendum)
If your symptoms persist or progress; chest Xray indicated.

## 2015-04-28 ENCOUNTER — Ambulatory Visit (INDEPENDENT_AMBULATORY_CARE_PROVIDER_SITE_OTHER): Payer: Medicare Other | Admitting: *Deleted

## 2015-04-28 ENCOUNTER — Telehealth (HOSPITAL_COMMUNITY): Payer: Self-pay

## 2015-04-28 DIAGNOSIS — I255 Ischemic cardiomyopathy: Secondary | ICD-10-CM

## 2015-04-28 DIAGNOSIS — I5042 Chronic combined systolic (congestive) and diastolic (congestive) heart failure: Secondary | ICD-10-CM | POA: Diagnosis not present

## 2015-04-28 DIAGNOSIS — Z95810 Presence of automatic (implantable) cardiac defibrillator: Secondary | ICD-10-CM | POA: Diagnosis not present

## 2015-04-28 LAB — CUP PACEART INCLINIC DEVICE CHECK
Brady Statistic RA Percent Paced: 1 % — CL
Brady Statistic RV Percent Paced: 91 %
Date Time Interrogation Session: 20160518040000
HighPow Impedance: 38 Ohm
HighPow Impedance: 47 Ohm
Lead Channel Impedance Value: 540 Ohm
Lead Channel Impedance Value: 558 Ohm
Lead Channel Impedance Value: 687 Ohm
Lead Channel Pacing Threshold Amplitude: 0.8 V
Lead Channel Pacing Threshold Amplitude: 1 V
Lead Channel Pacing Threshold Amplitude: 2.5 V
Lead Channel Pacing Threshold Pulse Width: 0.4 ms
Lead Channel Pacing Threshold Pulse Width: 0.4 ms
Lead Channel Pacing Threshold Pulse Width: 0.7 ms
Lead Channel Sensing Intrinsic Amplitude: 13.3 mV
Lead Channel Sensing Intrinsic Amplitude: 25 mV
Lead Channel Sensing Intrinsic Amplitude: 3.1 mV
Lead Channel Setting Pacing Amplitude: 2 V
Lead Channel Setting Pacing Amplitude: 2 V
Lead Channel Setting Pacing Amplitude: 3.5 V
Lead Channel Setting Pacing Pulse Width: 0.4 ms
Lead Channel Setting Pacing Pulse Width: 0.9 ms
Lead Channel Setting Sensing Sensitivity: 0.6 mV
Lead Channel Setting Sensing Sensitivity: 1 mV
Pulse Gen Serial Number: 370874
Zone Setting Detection Interval: 273 ms
Zone Setting Detection Interval: 316 ms
Zone Setting Detection Interval: 375 ms

## 2015-04-28 NOTE — Progress Notes (Signed)
CRT-D device check in office. Thresholds and sensing consistent with previous device measurements. Lead impedance trends stable over time. No mode switch episodes recorded. No ventricular arrhythmia episodes recorded. Patient bi-ventricularly pacing 91% of the time. Device programmed with appropriate safety margins---changed VF detection duration from 1.0 to 2.5s. Estimated longevity 75yr. Latitude 07/28/15 & ROV w/ GT in 649mo

## 2015-04-28 NOTE — Telephone Encounter (Signed)
[facility] called to clarify patient's Rx.  Patient was asking to refill Ramipril which had been discontinued.  Confirmed with rep over phone that this was still discontinued. Reference # [id number]  Renee Pain

## 2015-04-30 ENCOUNTER — Other Ambulatory Visit: Payer: Self-pay | Admitting: Internal Medicine

## 2015-05-05 ENCOUNTER — Encounter: Payer: Self-pay | Admitting: Gastroenterology

## 2015-05-06 ENCOUNTER — Encounter: Payer: Self-pay | Admitting: Cardiovascular Disease

## 2015-05-11 ENCOUNTER — Telehealth (HOSPITAL_COMMUNITY): Payer: Self-pay | Admitting: Vascular Surgery

## 2015-05-11 NOTE — Telephone Encounter (Signed)
Left message to make appointment

## 2015-05-28 ENCOUNTER — Encounter (HOSPITAL_COMMUNITY): Payer: Medicare Other

## 2015-05-31 ENCOUNTER — Encounter (HOSPITAL_COMMUNITY): Payer: Self-pay

## 2015-05-31 ENCOUNTER — Ambulatory Visit (HOSPITAL_COMMUNITY)
Admission: RE | Admit: 2015-05-31 | Discharge: 2015-05-31 | Disposition: A | Discharge status: Home / Self Care | Payer: Medicare Other | Source: Ambulatory Visit | Attending: Internal Medicine | Admitting: Internal Medicine

## 2015-05-31 VITALS — BP 114/66 | HR 70 | Wt 263.5 lb

## 2015-05-31 DIAGNOSIS — I5042 Chronic combined systolic (congestive) and diastolic (congestive) heart failure: Secondary | ICD-10-CM | POA: Diagnosis not present

## 2015-05-31 DIAGNOSIS — E785 Hyperlipidemia, unspecified: Secondary | ICD-10-CM | POA: Insufficient documentation

## 2015-05-31 DIAGNOSIS — Z87891 Personal history of nicotine dependence: Secondary | ICD-10-CM | POA: Diagnosis not present

## 2015-05-31 DIAGNOSIS — J438 Other emphysema: Secondary | ICD-10-CM

## 2015-05-31 DIAGNOSIS — J449 Chronic obstructive pulmonary disease, unspecified: Secondary | ICD-10-CM | POA: Diagnosis not present

## 2015-05-31 DIAGNOSIS — I714 Abdominal aortic aneurysm, without rupture: Secondary | ICD-10-CM | POA: Insufficient documentation

## 2015-05-31 DIAGNOSIS — K219 Gastro-esophageal reflux disease without esophagitis: Secondary | ICD-10-CM | POA: Diagnosis not present

## 2015-05-31 DIAGNOSIS — Z79899 Other long term (current) drug therapy: Secondary | ICD-10-CM | POA: Insufficient documentation

## 2015-05-31 DIAGNOSIS — E039 Hypothyroidism, unspecified: Secondary | ICD-10-CM | POA: Insufficient documentation

## 2015-05-31 DIAGNOSIS — R911 Solitary pulmonary nodule: Secondary | ICD-10-CM | POA: Diagnosis not present

## 2015-05-31 DIAGNOSIS — I10 Essential (primary) hypertension: Secondary | ICD-10-CM | POA: Insufficient documentation

## 2015-05-31 DIAGNOSIS — Z7982 Long term (current) use of aspirin: Secondary | ICD-10-CM | POA: Diagnosis not present

## 2015-05-31 DIAGNOSIS — I739 Peripheral vascular disease, unspecified: Secondary | ICD-10-CM

## 2015-05-31 DIAGNOSIS — Z7902 Long term (current) use of antithrombotics/antiplatelets: Secondary | ICD-10-CM | POA: Insufficient documentation

## 2015-05-31 DIAGNOSIS — D509 Iron deficiency anemia, unspecified: Secondary | ICD-10-CM | POA: Insufficient documentation

## 2015-05-31 DIAGNOSIS — E119 Type 2 diabetes mellitus without complications: Secondary | ICD-10-CM | POA: Diagnosis not present

## 2015-05-31 DIAGNOSIS — Z951 Presence of aortocoronary bypass graft: Secondary | ICD-10-CM

## 2015-05-31 DIAGNOSIS — I5022 Chronic systolic (congestive) heart failure: Secondary | ICD-10-CM | POA: Diagnosis not present

## 2015-05-31 DIAGNOSIS — I2510 Atherosclerotic heart disease of native coronary artery without angina pectoris: Secondary | ICD-10-CM | POA: Insufficient documentation

## 2015-05-31 DIAGNOSIS — Z95810 Presence of automatic (implantable) cardiac defibrillator: Secondary | ICD-10-CM | POA: Diagnosis not present

## 2015-05-31 DIAGNOSIS — I252 Old myocardial infarction: Secondary | ICD-10-CM | POA: Insufficient documentation

## 2015-05-31 DIAGNOSIS — Z9989 Dependence on other enabling machines and devices: Secondary | ICD-10-CM

## 2015-05-31 DIAGNOSIS — G4733 Obstructive sleep apnea (adult) (pediatric): Secondary | ICD-10-CM | POA: Diagnosis not present

## 2015-05-31 DIAGNOSIS — I255 Ischemic cardiomyopathy: Secondary | ICD-10-CM | POA: Diagnosis not present

## 2015-05-31 DIAGNOSIS — Z7952 Long term (current) use of systemic steroids: Secondary | ICD-10-CM | POA: Insufficient documentation

## 2015-05-31 LAB — COMPREHENSIVE METABOLIC PANEL
ALT: 25 U/L (ref 17–63)
AST: 25 U/L (ref 15–41)
Albumin: 3.7 g/dL (ref 3.5–5.0)
Alkaline Phosphatase: 75 U/L (ref 38–126)
Anion gap: 9 (ref 5–15)
BUN: 17 mg/dL (ref 6–20)
CO2: 28 mmol/L (ref 22–32)
Calcium: 9.3 mg/dL (ref 8.9–10.3)
Chloride: 102 mmol/L (ref 101–111)
Creatinine, Ser: 0.93 mg/dL (ref 0.61–1.24)
GFR calc Af Amer: >60 mL/min (ref >60)
GFR calc non Af Amer: >60 mL/min (ref >60)
Glucose, Bld: 100 mg/dL — ABNORMAL HIGH (ref 65–99)
Potassium: 4.1 mmol/L (ref 3.5–5.1)
Sodium: 139 mmol/L (ref 135–145)
Total Bilirubin: 1 mg/dL (ref 0.3–1.2)
Total Protein: 6.8 g/dL (ref 6.5–8.1)

## 2015-05-31 LAB — TSH: TSH: 3.588 u[IU]/mL (ref 0.350–4.500)

## 2015-05-31 MED ORDER — FLUTICASONE PROPIONATE 50 MCG/ACT NA SUSP: 1.0000 | Freq: Every day | NASAL | Status: DC

## 2015-05-31 MED ORDER — LOSARTAN POTASSIUM 50 MG PO TABS: 75.0000 mg | ORAL_TABLET | Freq: Two times a day (BID) | ORAL | Status: DC

## 2015-05-31 MED ORDER — SACUBITRIL-VALSARTAN 24-26 MG PO TABS: 1.0000 | ORAL_TABLET | Freq: Two times a day (BID) | ORAL | Status: DC

## 2015-05-31 NOTE — Patient Instructions (Signed)
LABS today (cmet tsh)....BMET in 2 weeks.  INCREASE losartan to '75mg'$  twice a day.  START Flonase.  START Entresto.  FOLLOW UP in 2 months,

## 2015-05-31 NOTE — Progress Notes (Signed)
Patient ID: [name], male   DOB: February 09, 1946, 69 y.o.   MRN: [id number] [facility]  05/31/2015 [name]   1946-10-05  [id number]  Primary Physicia [name], MD Hematologist: Dr Alen Blew  Pulmonary: Dr Annamaria Boots  HPI:  [name] is a pleasant 69 yo with a past medical history significant for morbid obesity, systolic heart failure with an EF of 30-35% (January 2014) -> 20-25% (March 2015 - RV normal), VT on amio, CAD s/p CABG 1996, LBBB, COPD, obstructive sleep apnea on CPAP, AAA repair (January 2015) who was admitted to [facility] on 02/24/14 for acute on chronic HF and acute COPD exacerbation. He also had iron deficiency anemia.   Admitted to [facility] July 24 through July 06 2014 with chest pain.  CEs negative. Had a LHC with no change from previous LHC with recommendations to continue medical management. Discharge weight was 255 pounds.   LHC 07/06/14 --No significant change from previous studies.  Left anterior descending (LAD): The LAD is a large vessel. There is a 40% stenosis immediately after the takeoff of a large septal perforator. There are 2 large diagonal branches without significant disease. Left circumflex (LCx): The LCx is occluded proximally. There are left to left collaterals.  Right coronary artery (RCA): The RCA is occluded proximally immediately following the conus branch. There are right to right and left to right collaterals. SVGs from CABG known to be totally occluded.   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   He returns for follow up. Says he is compliant with his medication, still on lasix. Unable to get Delene Loll [employer] will not cover), interested in cost program if one is available. No chest pain.  Still gets short of breath with moderate exertion. If he walks slow he is fine, and still can't walk very far. Cannot do stairs very well. He is watching diet and is eating fish, chicken, and vegetables and walking around his backyard and walking track at  church for exercise.  No melena or BRBPR.  No claudication, no pedal ulcerations.  Gets Fe infusions intermittently for Fe-deficiency anemia.  Weight is down 9 lbs.  (272 -> 263. last visit)  CTA chest/abd/pelvis (3/16) with moderate emphysema, 1.3 cm nodule RUL, left SFA totally occluded, right SFA with significant stenosis, s/p endovascular AAA repair (stable).   Labs 3/15 Cr 1.5 K 5.6 Labs 07/06/14 K 3.9 Creatinine  0.98 Labs 9/15 K 5.1, creatinine 0.96 Labs 11/15 K 3.8, creatinine 0.83, LDL 61, LFTs normal, TSH normal Labs 11/26/14 K 5.0 Creatinine 0.73  Labs 12/08/14 K 3.4 Creatinine 0.83  Labs 3/16 K 4.4, creatinine 1.03, LFTs normal, TSH normal, HCT 37.8, BNP 65  Past Medical History  Diagnosis Date  . Myocardial infarction   . Hypertension   . CHF (congestive heart failure)   . COPD (chronic obstructive pulmonary disease)   . GERD (gastroesophageal reflux disease)   . AAA (abdominal aortic aneurysm)     followed by Dr. Bridgett Larsson  . Tremor   . Dysrhythmia     ICD-defibrillator  . Carotid artery stenosis     LCEA - Dr. Bridgett Larsson in 2013  . Peripheral vascular disease     LCEA, L renal artery stent, bilat iliac stents, R SFA stenosis  . Adenomatous colon polyp 01/2004  . Diverticulosis   . AVM (arteriovenous malformation)   . CAD (coronary artery disease)     s/p CABGx2 in 1996  . HLD (hyperlipidemia)   . Hypothyroidism   . BPH (  benign prostatic hypertrophy)   . Fatigue   . Automatic implantable cardioverter-defibrillator in situ   . OSA on CPAP     AHI durign total sleep 14.69/hr, during REM 50.91/hr  . H/O hiatal hernia   . Anemia     hx  . Complication of anesthesia     claustrophobic, unabe to lie on back more than 4 hours at time due to back  . Ischemic cardiomyopathy   . Ventricular tachycardia 09/09/2014    Amiodarone was started after appropriate defibrillator shocks for ventricular tachycardia in October 2008  . Diabetes mellitus     DIET CONTROLLED- pt states that  this was a misdiagnosis, he was treated while in the hospital  but returned home, loss a massive amount of weight and he has not had a problem with his blood sugar since. States everything has been normal for about 3 years.    Current Outpatient Prescriptions  Medication Sig Dispense Refill  . albuterol (PROVENTIL HFA;VENTOLIN HFA) 108 (90 BASE) MCG/ACT inhaler Inhale 2 puffs into the lungs every 6 (six) hours as needed for wheezing or shortness of breath. 3 Inhaler 4  . albuterol (PROVENTIL) (2.5 MG/3ML) 0.083% nebulizer solution Take 3 mLs (2.5 mg total) by nebulization every 6 (six) hours as needed for wheezing or shortness of breath. 75 mL 12  . amiodarone (CORDARONE) 200 MG tablet Take 1 tablet (200 mg total) by mouth daily. 90 tablet 3  . aspirin 81 MG tablet Take 81 mg by mouth daily.    Marland Kitchen atorvastatin (LIPITOR) 40 MG tablet Take 1 tablet (40 mg total) by mouth daily. 90 tablet 3  . benzonatate (TESSALON) 200 MG capsule Take 1 capsule (200 mg total) by mouth 3 (three) times daily as needed for cough. 21 capsule 0  . carvedilol (COREG) 25 MG tablet Take 1 tablet (25 mg total) by mouth 2 (two) times daily with a meal. 180 tablet 3  . clopidogrel (PLAVIX) 75 MG tablet Take 1 tablet (75 mg total) by mouth daily. 90 tablet 3  . finasteride (PROSCAR) 5 MG tablet Take 5 mg by mouth daily.    . furosemide (LASIX) 40 MG tablet     . furosemide (LASIX) 80 MG tablet Take 120 mg by mouth 2 (two) times daily.    . isosorbide mononitrate (IMDUR) 60 MG 24 hr tablet Take 1 tablet (60 mg total) by mouth daily. 60 tablet 4  . levothyroxine (SYNTHROID, LEVOTHROID) 50 MCG tablet Take 1 tablet (50 mcg total) by mouth daily before breakfast. 90 tablet 1  . losartan (COZAAR) 50 MG tablet Take 1 tablet (50 mg total) by mouth 2 (two) times daily. 60 tablet 6  . Multiple Vitamins-Minerals (MENS 50+ MULTI VITAMIN/MIN) TABS Take 1 tablet by mouth daily.     . multivitamin-lutein (OCUVITE-LUTEIN) CAPS Take 1 capsule  by mouth daily.      Marland Kitchen NITROSTAT 0.4 MG SL tablet Dissolve one tablet under  the tongue every 5 minutes  as needed for chest pain  (up to 3 tabs in 15 minutes then call 911) 25 tablet 6  . NON FORMULARY at bedtime. CPAP And during the day as needed    . prednisoLONE acetate (PRED FORTE) 1 % ophthalmic suspension     . predniSONE (DELTASONE) 10 MG tablet 1 tid with meals 21 tablet 0  . PROLENSA 0.07 % SOLN     . spironolactone (ALDACTONE) 25 MG tablet Take 1 tablet by mouth  daily 90 tablet 3  .  sulfamethoxazole-trimethoprim (BACTRIM DS,SEPTRA DS) 800-160 MG per tablet Take 1 tablet by mouth 2 (two) times daily. 20 tablet 0  . Umeclidinium-Vilanterol (ANORO ELLIPTA) 62.5-25 MCG/INH AEPB Inhale 1 puff into the lungs daily. 14 each 0   No current facility-administered medications for this encounter.    Allergies  Allergen Reactions  . Tussionex Pennkinetic Er [Hydrocod Polst-Cpm Polst Er] Other (See Comments)    "caused prostate problems"  . Ace Inhibitors Other (See Comments)    REACTION: cough  . Penicillins Other (See Comments)    "childhood allergy"    History   Social History  . Marital Status: Married    Spouse Name: [name]  . Number of Children: 2  . Years of Education: [name]   Occupational History  . retired Solicitor     Social History Main Topics  . Smoking status: Former Smoker -- 1.50 packs/day for 40 years    Types: Cigarettes    Quit date: 12/11/2002  . Smokeless tobacco: Former Systems developer  . Alcohol Use: No  . Drug Use: No  . Sexual Activity: Not Currently   Other Topics Concern  . Not on file   Social History Narrative    Blood pressure 114/66, pulse 70, weight 263 lb 8 oz (119.523 kg), SpO2 96 %.  General appearance: alert, cooperative and no distress Wife present.  Neck: Thick.  No carotid bruit or JVP appreciated. L CEA scar Lungs: CTA bilaterally.    Heart: regular rate and rhythm, S1, S2 normal, 1/6 SEM at RUSB.  No S3/S4.  Extremities:  No  clubbing/cyanosis/edema.  Compression stockings on. Skin: warm and dry Neurologic: Grossly normal  ASSESSMENT/PLAN:  1. Chronic systolic CHF: Ischemic cardiomyopathy. ECHO 02/2014 EF 20-25%.  [place] CRT-D. NYHA II-III symptoms, stable. Likely combination of CHF and moderate COPD. Volume status looks ok today and weight is down 9 lbs.   - Continue lasix 120 mg twice a day.     - Continue carvedilol 25 mg twice a day  - Increase losartan 75 mg bid (Will try again for Entresto 24/26 bid, will continue losartan 75 mg bid if not able to get coverage for this).  - Continue spironolactone 25 mg daily.  - BMET today and again in 1 week with change in losartan.  2. CAD s/p CABG: No chest pain. Continue 81 mg aspirin daily and atorvastatin daily.  Good lipids in 11/15.  3. VT s/p ICD: On amiodarone for history of ICD discharges due to VT.  LFTs/TSH today.  He should get yearly eye exams.  4. Morbid obesity: Exercising more and eating better, weight down 9 lbs.   5. COPD: Moderate to severe.  Contributing to dyspnea. Has lung nodule, positionally not a great candidate for biopsy.  Following with CVTS. Says breathing has been great after losing weight. 6. PAD: Occluded left SFA and significant right SFA stenosis on recent CTA.  He does not report claudication and does not have pedal ulcers.  Followed by [facility]. He is on statin.   Follow up 2 months. Needs labs today. Repeat BMET x 7 days   Shirley Friar, [place] 05/31/2015   Patient seen with PA, agree with the above note.  Patient is doing well, weight is down. NYHA class II-III symptoms. He is not volume overloaded on exam.   - Will increase losartan to 75 mg bid today unless we are able to get approval for Entresto 24/26 bid.  - Continue Lasix at same dose.  -  BMET today and again in 1-2 wks.   - LFTs/TSH to be checked with amiodarone use.  - PAD followup with vascular and lung nodule followup with CVTS.   [name] 05/31/2015

## 2015-06-01 ENCOUNTER — Other Ambulatory Visit: Payer: Self-pay | Admitting: Internal Medicine

## 2015-06-10 NOTE — Patient Outreach (Signed)
[place] [facility]) Care Management  06/10/2015  JULY NICKSON August 06, 1946 [id number]   Referral from High Risk List, assigned to Quinn Plowman, RN to outreach patient.  [name]. [place], [place] Management [place] Assistant Phone: [phone] Fax: [phone]

## 2015-06-17 ENCOUNTER — Other Ambulatory Visit: Payer: Self-pay

## 2015-06-17 NOTE — Patient Outreach (Signed)
[place] [facility]) Care Management  06/17/2015  AVROHOM MCKELVIN [birth year] [id number]   Telephone call to patient regarding high risk list referral.  HIPAA verified with patient.  Discussed and offered [facility] care management services to patient.  Patient refused services at this time.  Patient verbalized agreement to receive [facility] care management outreach letter and brochure.  PLAN: RNCM will forward to Lurline Del to close due to refusal of services.  RNCM will notify patient primary MD of refusal of services.  RNCM will send patient [facility] outreach letter and brochure.   Quinn Plowman RN,BSN,CCM [place] Coordinator [phone]

## 2015-06-21 NOTE — Patient Outreach (Signed)
[place] [facility]) Care Management  06/21/2015  [name] 07-Aug-1946 [id number]   Notification from Quinn Plowman, RN to close case due to patient refused [place].  [name]. [place], [place] Management [place] Assistant Phone: [phone] Fax: [phone]

## 2015-06-24 ENCOUNTER — Other Ambulatory Visit: Payer: Self-pay | Admitting: Thoracic Surgery (Cardiothoracic Vascular Surgery)

## 2015-06-24 DIAGNOSIS — R911 Solitary pulmonary nodule: Secondary | ICD-10-CM

## 2015-07-05 ENCOUNTER — Encounter: Payer: Self-pay | Admitting: Internal Medicine

## 2015-07-05 ENCOUNTER — Ambulatory Visit (INDEPENDENT_AMBULATORY_CARE_PROVIDER_SITE_OTHER): Payer: Medicare Other | Admitting: Internal Medicine

## 2015-07-05 ENCOUNTER — Telehealth (HOSPITAL_COMMUNITY): Payer: Self-pay | Admitting: *Deleted

## 2015-07-05 VITALS — BP 112/74 | HR 68 | Ht 68.0 in | Wt 268.0 lb

## 2015-07-05 DIAGNOSIS — G4733 Obstructive sleep apnea (adult) (pediatric): Secondary | ICD-10-CM

## 2015-07-05 DIAGNOSIS — I255 Ischemic cardiomyopathy: Secondary | ICD-10-CM

## 2015-07-05 DIAGNOSIS — R911 Solitary pulmonary nodule: Secondary | ICD-10-CM | POA: Diagnosis not present

## 2015-07-05 DIAGNOSIS — Z9989 Dependence on other enabling machines and devices: Secondary | ICD-10-CM

## 2015-07-05 NOTE — Progress Notes (Signed)
06/03/12- 69 yoM former smoker seen in pulmonary consultation at the request of Dr. Ronnald Ramp. Per Dr. Ronnald Ramp. Patient c/o sob and difficultly breathing when sitting while talking and walking.  Denies chest pain, chest tightness, wheeizng, and cough. Diagnosed with COPD 10 years ago. Wife is here. COPD Assessment Test (CAT) scored 24/40. He had smoked a pack a day half per day until 2005. 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 He was then diagnosed with hypothyroidism. Since then he has not exercised regularly, has felt weak with easier dyspnea on exertion. He denies cough or wheeze. Little change from day to day. His wife says he gets short of breath climbing 10 steps and he has to stop at the top. He has gained weight since thyroid diagnosis and with lack of exercise. He indicates 40 pound weight gain in the past 2 years. Coronary artery disease with history of multiple myocardial infarctions, congestive heart failure, hypertension, implanted AICD in left chest. No history of asthma. Ankles swell. Diagnosed obstructive sleep apnea/Dr. Kelly/CPAP/Advanced, unknown pressure. Anemia 4 years ago required transfusion. Uses Spiriva daily, occasional rescue Combivent./ MI/ AICD, OSA(Dr Kelly)  07/16/12- 69 yoM former smoker followed for dyspnea/ COPD, complicated by hx OSA/ Dr Claiborne Billings, CAD/ CHF/ MI/ AICD Wife is here today Found to be anemic and now treated with iron. Very short of breath over the weekend/blames weather, limiting dyspnea on exertion then but today feels "great". Using Spiriva with rare need for rescue albuterol. Says he is getting bronchitis less often than in the past. COPD assessment test (CAT) score 27/40 Complains of nasal stuffiness supine. Failed nasal strips. Sleeps sitting up because of this and uses saline nasal rinse. 6 minute walk test 07/16/2012: 95%, 97%, 98%, 216 m-ended test labored  for breath and fatigue but without desaturation. CXR 06/10/12 reviewed with them-bronchitis changes, CE/ASVD/CABG IMPRESSION:  1. Diffuse peribronchial cuffing. Given the patient's history of  prior smoking, this may suggest chronic bronchitis.  2. In addition, there is mild cardiomegaly with pulmonary venous  congestion, but no frank pulmonary edema at this time.  3. Status post median sternotomy for CABG.  4. Atherosclerosis.  Original Report Authenticated By: Etheleen Mayhew, M.D.  PFT: 07/16/2012-moderate to severe obstructive airways disease with air trapping, diffusion mildly reduced. FEV1/FVC 0.49. Insignificant response to bronchodilator. RV 128%, DLCO 69%.   12/20/12-  69 yoM former smoker followed for dyspnea/ COPD, complicated by hx OSA/ Dr Claiborne Billings, CAD/ CHF/ MI/ AICD     Wife here FOLLOWS FOR: breathing is unchanged since last OV. denies chest pain, chest tightness or coughing. reports increased SOB with extertion. Probably no change in dyspnea with exertion when he thinks in terms of the impact on ADLs. He says he joined weight watchers last night and plans to lose 100 pounds. His cardiologist has increased his diuretic. No routine cough, phlegm or wheeze. CXR 06/10/12 IMPRESSION:  1. Diffuse peribronchial cuffing. Given the patient's history of  prior smoking, this may suggest chronic bronchitis.  2. In addition, there is mild cardiomegaly with pulmonary venous  congestion, but no frank pulmonary edema at this time.  3. Status post median sternotomy for CABG.  4. Atherosclerosis.  Original Report Authenticated By: Etheleen Mayhew, M.D.  03/11/14- 69 yoM former smoker followed for dyspnea/ COPD, complicated by hx OSA/ Dr Claiborne Billings, CAD/ CHF/ MI/ AICD     Wife here Presents with SOB with any  exertion, chest congestion, nonprod cough.  Cardiologist recommended this visit yesterday.  Wears cpap [facility] nightly, 12 hours/night as well as during day.  Using rescue inhaler and  Spiriva. Has not qualified for home oxygen. 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 In early March and rhonchi this with green sputum. Office spirometry 03/11/14- weak/submaximal effort. By the numbers-severe obstructive airways disease and restriction of exhaled volume. FVC 2.49/59%, FEV1 1.29/89%, FEV1/FEC 0.5 to, FEF 25-75% 0.82/27%. CXR 03/02/14 IMPRESSION:  Improved lung aeration with resolving edema and atelectasis. No  pleural effusion.  Electronically Signed  By: Kalman Jewels M.D.  On: 03/02/2014 09:59  05/12/14-  FOLLOWS FOR: Wears CPAP/ [facility]/managed by Dr Claiborne Billings, every night for about 8-10 hours; DME is [facility]. Machine 70 yrs old. Patient would like me to manage CPAP/ sleep. Breathing well with nebs. Easy DOE. No cough on lisinopril.  03/04/15- 69 yoM former smoker followed for dyspnea/ COPD, complicated by hx OSA/ Dr Claiborne Billings, CAD/ CHF/ MI/ AICD/CM/ AAA     Wife here  CPAP/ [facility]/managed here FOLLOWS FOR wear CPAP every night for 8-10 hours. DME is [facility]. C/O increased SOB especially with movement  Uncertain pressure. He and wife both report that he uses CPAP all night and anytime he naps. Machine is now quite old. He questions if another setting would be better. CT abdomen from 02/19/2015 showed incidental right upper lobe 1.3 cm groundglass density with follow-up chest CT scheduled for 03/19/2015. Home oxygen saturations running 96-99% even though he feels short of breath with exertion he uses nebulizer or metered inhaler 3 or 4 times daily, for breathlessness without much wheezing. Walk test 03/04/15- did not desaturate  07/05/15- 69 yoM former smoker followed for dyspnea/ COPD, complicated by hx OSA, CAD/ CHF/ MI/ AICD/CM/ AAA, lung nodule     Wife here FOLLOWS FOR: DME-[facility], cpap auto is working fine, wife states he loves it, also patient noticed tremendous improvement since starting  Entresto (sacubitril-  valsartan)  Questions if CPAP pressure could be a little higher.  Asks jury duty letter- too short of breath to climb steps. CT showed RUL nodule- Dr Hendrickson/ [facility] has scheduled f/u CT. CT chest 03/19/15 IMPRESSION: 1. Short interval stability of a solitary, partially solid and sub-solid right upper lung lesion. The solid component is greater than 5 mm. Still, persistence for 3 months has yet to be confirmed by CT. Recommend thoracic surgery consultation and/or repeat chest CT in 3 months. 2. Centrilobular emphysema. No mediastinal lymphadenopathy. Electronically Signed  By: Genevie Ann M.D.  On: 03/19/2015 10:57  ROS-see HPI Constitutional:   No-   weight loss, night sweats, fevers, chills, fatigue, lassitude. HEENT:   No-  headaches, difficulty swallowing, tooth/dental problems, sore throat,       No-  sneezing, itching, ear ache, nasal congestion, post nasal drip,  CV:  No-   chest pain, +orthopnea, PND, +swelling in lower extremities, No-anasarca, dizziness, palpitations Resp: + shortness of breath with exertion or at rest.              No-   productive cough,  No non-productive cough,  No- coughing up of blood.              No-   change in color of mucus.  No- wheezing.   Skin: No-   rash or lesions. GI:  No-   heartburn, indigestion, abdominal pain, nausea, vomiting, GU: . MS:  + joint  pain or swelling.  Neuro-     nothing unusual Psych:  No- change in mood or affect. No depression or anxiety.  No memory loss.  OBJ- Physical Exam   94% at rest room air General- Alert, Oriented, Affect-appropriate, Distress- none acute, +obese, talkative Skin- clear Lymphadenopathy- none Head- atraumatic            Eyes- Gross vision intact, PERRLA, conjunctivae and secretions clear            Ears- Hearing, canals-normal            Nose- +turbinate edema, no-Septal dev, mucus, polyps, erosion, perforation             Throat- Mallampati III, mucosa clear , drainage- none, tonsils-  atrophic Neck- flexible , trachea midline, no stridor , thyroid nl, carotid no bruit Chest - symmetrical excursion , unlabored           Heart/CV- RRR , no murmur , no gallop  , no rub, nl s1 s2                           - JVD- none , edema 1+, stasis changes- none, varices- none           Lung- clear to P&A shallow inspiratory effort, rales- none, wheeze- none, cough- none ,  dullness-none, rub- none,            Chest wall- sternotomy scar,  Pacemaker defibrillator L Abd-  Br/ Gen/ Rectal- Not done, not indicated Extrem- cyanosis- none, clubbing, none, atrophy- none, strength- nl, vein donor scars Neuro- head bob tremor

## 2015-07-05 NOTE — Telephone Encounter (Signed)
Pt's Entresto 24/26 mg is approved 12/03/14-12/04/15 PA # [id number], copay is $45/30 day supply at local pharmacy or $35 from [facility] (preferred pharmacy)

## 2015-07-05 NOTE — Patient Instructions (Addendum)
Order- DME [facility]-   Download  CPAP for pressure compliance  OSA   Please call as needed  Madaline Savage duty letter

## 2015-07-14 ENCOUNTER — Encounter: Payer: Self-pay | Admitting: Internal Medicine

## 2015-07-14 NOTE — Assessment & Plan Note (Signed)
Noted. Being followed by thoracic surgery

## 2015-07-14 NOTE — Assessment & Plan Note (Signed)
He asks jury excusal letter based on dyspnea with exertion. Obesity and heart disease contribute. Weight loss emphasized.

## 2015-07-14 NOTE — Assessment & Plan Note (Signed)
Discussed optimal pressure and compliance goals and importance of weight

## 2015-07-19 ENCOUNTER — Encounter: Payer: Self-pay | Admitting: Internal Medicine

## 2015-07-20 ENCOUNTER — Ambulatory Visit: Payer: Medicare Other | Admitting: Thoracic Surgery (Cardiothoracic Vascular Surgery)

## 2015-07-27 ENCOUNTER — Ambulatory Visit
Admission: RE | Admit: 2015-07-27 | Discharge: 2015-07-27 | Disposition: A | Discharge status: Home / Self Care | Payer: Medicare Other | Source: Ambulatory Visit | Attending: Thoracic Surgery (Cardiothoracic Vascular Surgery) | Admitting: Thoracic Surgery (Cardiothoracic Vascular Surgery)

## 2015-07-27 ENCOUNTER — Ambulatory Visit (INDEPENDENT_AMBULATORY_CARE_PROVIDER_SITE_OTHER): Payer: Medicare Other | Admitting: Thoracic Surgery (Cardiothoracic Vascular Surgery)

## 2015-07-27 ENCOUNTER — Encounter: Payer: Self-pay | Admitting: Thoracic Surgery (Cardiothoracic Vascular Surgery)

## 2015-07-27 VITALS — BP 122/57 | HR 70 | Resp 20 | Ht 68.0 in | Wt 268.0 lb

## 2015-07-27 DIAGNOSIS — J449 Chronic obstructive pulmonary disease, unspecified: Secondary | ICD-10-CM

## 2015-07-27 DIAGNOSIS — I5042 Chronic combined systolic (congestive) and diastolic (congestive) heart failure: Secondary | ICD-10-CM

## 2015-07-27 DIAGNOSIS — J432 Centrilobular emphysema: Secondary | ICD-10-CM | POA: Diagnosis not present

## 2015-07-27 DIAGNOSIS — Z87891 Personal history of nicotine dependence: Secondary | ICD-10-CM | POA: Diagnosis not present

## 2015-07-27 DIAGNOSIS — I255 Ischemic cardiomyopathy: Secondary | ICD-10-CM | POA: Diagnosis not present

## 2015-07-27 DIAGNOSIS — R911 Solitary pulmonary nodule: Secondary | ICD-10-CM

## 2015-07-27 NOTE — Progress Notes (Signed)
[address]       [place] [address]             [phone]       HPI:  Mr. Mawson returns today for scheduled 3 month follow-up visit.  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 His PMH is complicated and includes CHF, CAD, s/ CABG, s/p multiple stents, VT, ICD, PVD, hypertension, hyperlipidemia, severe COPD, morbid obesity, sleep apnea, and an AAA. In May he had a CT for follow up of an aortic endograft. There was an incidentally noted ground glass nodule centrally in the right upper lobe. A dedicated CT of the chest was done which again showed the nodule. There was no hilar or mediastinal adenopathy.  He has COPD and CHF. His CHF seems to be the most limiting factor. He gets SOB with walking < 100 feet. He can get up one flight of stairs if he "pulls himself up" but is very SOB after doing so. He has lost 10 pounds over the past 3 months by diet. He denies cough, hemoptysis, wheezing, orthopnea and PND.   He says that since his last visit his shortness of breath is unchanged. He has not had any unusual cough, hemoptysis, fever, chills, sweats or weight loss.  Past Medical History  Diagnosis Date  . Myocardial infarction   . Hypertension   . CHF (congestive heart failure)   . COPD (chronic obstructive pulmonary disease)   . GERD (gastroesophageal reflux disease)   . AAA (abdominal aortic aneurysm)     followed by Dr. Bridgett Larsson  . Tremor   . Dysrhythmia     ICD-defibrillator  . Carotid artery stenosis     LCEA - Dr. Bridgett Larsson in 2013  . Peripheral vascular disease     LCEA, L renal artery stent, bilat iliac stents, R SFA stenosis  . Adenomatous colon polyp 01/2004  . Diverticulosis   . AVM (arteriovenous malformation)   . CAD (coronary artery disease)     s/p CABGx2 in 1996  . HLD (hyperlipidemia)   . Hypothyroidism   . BPH (benign prostatic hypertrophy)   . Fatigue   . Automatic implantable cardioverter-defibrillator in  situ   . OSA on CPAP     AHI durign total sleep 14.69/hr, during REM 50.91/hr  . H/O hiatal hernia   . Anemia     hx  . Complication of anesthesia     claustrophobic, unabe to lie on back more than 4 hours at time due to back  . Ischemic cardiomyopathy   . Ventricular tachycardia 09/09/2014    Amiodarone was started after appropriate defibrillator shocks for ventricular tachycardia in October 2008  . Diabetes mellitus     DIET CONTROLLED- pt states that this was a misdiagnosis, he was treated while in the hospital  but returned home, loss a massive amount of weight and he has not had a problem with his blood sugar since. States everything has been normal for about 3 years.     Current Outpatient Prescriptions  Medication Sig Dispense Refill  . albuterol (PROVENTIL HFA;VENTOLIN HFA) 108 (90 BASE) MCG/ACT inhaler Inhale 2 puffs into the lungs every 6 (six) hours as needed for wheezing or shortness of breath. 3 Inhaler 4  . albuterol (PROVENTIL) (2.5 MG/3ML) 0.083% nebulizer solution Take 3 mLs (2.5 mg total) by nebulization every 6 (six) hours as needed for wheezing or shortness of breath. 75 mL  12  . amiodarone (CORDARONE) 200 MG tablet Take 1 tablet (200 mg total) by mouth daily. 90 tablet 3  . aspirin 81 MG tablet Take 81 mg by mouth daily.    Marland Kitchen atorvastatin (LIPITOR) 40 MG tablet Take 1 tablet (40 mg total) by mouth daily. 90 tablet 3  . benzonatate (TESSALON) 200 MG capsule Take 1 capsule (200 mg total) by mouth 3 (three) times daily as needed for cough. 21 capsule 0  . carvedilol (COREG) 25 MG tablet Take 1 tablet (25 mg total) by mouth 2 (two) times daily with a meal. 180 tablet 3  . clopidogrel (PLAVIX) 75 MG tablet Take 1 tablet (75 mg total) by mouth daily. 90 tablet 3  . finasteride (PROSCAR) 5 MG tablet Take 5 mg by mouth daily.    . fluticasone (FLONASE) 50 MCG/ACT nasal spray Place 1 spray into both nostrils daily. 16 g 2  . furosemide (LASIX) 40 MG tablet     . furosemide  (LASIX) 80 MG tablet Take 120 mg by mouth 2 (two) times daily.    . isosorbide mononitrate (IMDUR) 60 MG 24 hr tablet Take 1 tablet (60 mg total) by mouth daily. 60 tablet 4  . levothyroxine (SYNTHROID, LEVOTHROID) 50 MCG tablet Take 1 tablet by mouth  daily before breakfast 90 tablet 1  . losartan (COZAAR) 50 MG tablet Take 1.5 tablets (75 mg total) by mouth 2 (two) times daily. 90 tablet 6  . Multiple Vitamins-Minerals (MENS 50+ MULTI VITAMIN/MIN) TABS Take 1 tablet by mouth daily.     . multivitamin-lutein (OCUVITE-LUTEIN) CAPS Take 1 capsule by mouth daily.      Marland Kitchen NITROSTAT 0.4 MG SL tablet Dissolve one tablet under  the tongue every 5 minutes  as needed for chest pain  (up to 3 tabs in 15 minutes then call 911) 25 tablet 6  . NON FORMULARY at bedtime. CPAP And during the day as needed    . PROLENSA 0.07 % SOLN     . sacubitril-valsartan (ENTRESTO) 24-26 MG Take 1 tablet by mouth 2 (two) times daily. 60 tablet 3  . spironolactone (ALDACTONE) 25 MG tablet Take 1 tablet by mouth  daily 90 tablet 3   No current facility-administered medications for this visit.    Physical Exam BP 122/57 mmHg  Pulse 70  Resp 20  Ht '5\' 8"'$  (1.727 m)  Wt 268 lb (121.564 kg)  BMI 40.76 kg/m2  SpO21 10% 69 year old obese male in no acute distress Alert and oriented 3 no focal deficits No cervical or suprapubic or adenopathy Cardiac regular rate and rhythm, difficult to auscultate heart sounds Lungs with diminished breath sounds bilaterally  Diagnostic Tests: CT CHEST WITHOUT CONTRAST  TECHNIQUE: Multidetector CT imaging of the chest was performed following the standard protocol without IV contrast.  COMPARISON: 03/19/2015 chest CT.  FINDINGS: Mediastinum/Nodes: Stable top-normal heart size. No pericardial fluid/thickening. There is atherosclerosis of the thoracic aorta, the great vessels of the mediastinum and the coronary arteries, including calcified atherosclerotic plaque in the left  main, left anterior descending, left circumflex and right coronary arteries. Postsurgical changes from CABG are again noted. Three lead left subclavian ICD is noted with lead tips in the right atrium, coronary sinus and right ventricular apex. Aortic annular calcification is noted. Great vessels are normal in course and caliber. Normal visualized thyroid. Normal esophagus. No axillary, mediastinal or gross hilar lymphadenopathy, noting limited sensitivity for the detection of hilar adenopathy on this noncontrast study.  Lungs/Pleura: No pneumothorax. No  pleural effusion. Mild-to-moderate centrilobular emphysema. There is a persistent 2.0 x 1.0 cm irregularly marginated sub solid pulmonary nodule in the right upper lobe (series 4/ image 20), with an 8 mm solid component (as measured on coronal images), which is unchanged in size since 02/19/2015 where it measured 2.0 x 1.0 cm using similar measurement technique. Stable mild pleural-parenchymal scarring at the posterior right lung apex. Stable 2 mm calcified granuloma in the anterior right lower lobe. No new significant pulmonary nodules. No acute consolidative airspace disease.  Upper abdomen: Partially visualized and incompletely evaluated is an infrarenal abdominal aortic aneurysm with partially visualized aortic stent.  Musculoskeletal: Stable symmetric mild bilateral gynecomastia. Mild degenerative changes in the thoracic spine. Median sternotomy wires appear intact. No aggressive appearing focal osseous lesions.  IMPRESSION: 1. Interval stability of 2.0 x 1.0 cm irregular sub-solid pulmonary nodule in the right upper lobe with 8 mm solid component, with greater than 3 month persistence. This nodule is indeterminate, and primary lung adenocarcinoma is on the differential. PET-CT may be falsely negative in the setting of subsolid pulmonary nodules. If this nodule is not resected, a follow-up chest CT is advised in  6 months. 2. Mild-to-moderate centrilobular emphysema.   Electronically Signed  By: Ilona Sorrel M.D.  On: 07/27/2015 09:22  I personally reviewed the CT scan. The nodule is unchanged to slightly improved over the past 3 months.  Impression: 69 year old man with a history of tobacco abuse who has a groundglass nodule in the right upper lobe. This is a mixed solid/sub-solid appearance. It definitely has not grown over the past 3 months and may be slightly smaller, although that could be due to a slight variation in the cut.  I discussed the results with Mr. and Mrs. Vandezande. They understand that lack of growth does not rule out cancer. We discussed the options of continued radiographic follow-up versus navigational bronchoscopy for biopsy. We reviewed the pros and cons of each approach. He would be a high risk candidate for even navigational bronchoscopy due to his complex cardiac history and associated multiple morbidities. Although Mrs. Gaby favors biopsy, Mr. Prisk very much wants to continue with radiographic follow-up.  Plan: Return in 3 months with CT chest  I spent 10 minutes with Mr. evenson during this visit, greater than 50% was spent counseling. [name], MD [facility] [phone]

## 2015-07-28 ENCOUNTER — Ambulatory Visit (INDEPENDENT_AMBULATORY_CARE_PROVIDER_SITE_OTHER): Payer: Medicare Other | Admitting: *Deleted

## 2015-07-28 DIAGNOSIS — I255 Ischemic cardiomyopathy: Secondary | ICD-10-CM

## 2015-07-28 NOTE — Progress Notes (Signed)
Remote ICD transmission.   

## 2015-08-04 LAB — CUP PACEART REMOTE DEVICE CHECK
Battery Remaining Longevity: 96 mo
Battery Remaining Percentage: 100 %
Brady Statistic RA Percent Paced: 0 %
Brady Statistic RV Percent Paced: 92 %
Date Time Interrogation Session: 20160817064100
HighPow Impedance: 44 Ohm
Lead Channel Impedance Value: 541 Ohm
Lead Channel Impedance Value: 543 Ohm
Lead Channel Impedance Value: 662 Ohm
Lead Channel Pacing Threshold Amplitude: 0.8 V
Lead Channel Pacing Threshold Amplitude: 1 V
Lead Channel Pacing Threshold Amplitude: 2.5 V
Lead Channel Pacing Threshold Pulse Width: 0.4 ms
Lead Channel Pacing Threshold Pulse Width: 0.4 ms
Lead Channel Pacing Threshold Pulse Width: 0.7 ms
Lead Channel Setting Pacing Amplitude: 2 V
Lead Channel Setting Pacing Amplitude: 2 V
Lead Channel Setting Pacing Amplitude: 3.5 V
Lead Channel Setting Pacing Pulse Width: 0.4 ms
Lead Channel Setting Pacing Pulse Width: 0.9 ms
Lead Channel Setting Sensing Sensitivity: 0.6 mV
Lead Channel Setting Sensing Sensitivity: 1 mV
Pulse Gen Serial Number: 370874
Zone Setting Detection Interval: 273 ms
Zone Setting Detection Interval: 316 ms
Zone Setting Detection Interval: 375 ms

## 2015-08-05 ENCOUNTER — Other Ambulatory Visit: Payer: Self-pay | Admitting: Internal Medicine

## 2015-08-05 NOTE — Telephone Encounter (Signed)
REFILL 

## 2015-08-12 ENCOUNTER — Telehealth: Payer: Self-pay | Admitting: Internal Medicine

## 2015-08-12 MED ORDER — ALBUTEROL SULFATE (2.5 MG/3ML) 0.083% IN NEBU: 2.5000 mg | INHALATION_SOLUTION | Freq: Four times a day (QID) | RESPIRATORY_TRACT | Status: DC | PRN

## 2015-08-12 MED ORDER — ALBUTEROL SULFATE (2.5 MG/3ML) 0.083% IN NEBU: 2.5000 mg | INHALATION_SOLUTION | Freq: Four times a day (QID) | RESPIRATORY_TRACT | Status: DC

## 2015-08-12 NOTE — Telephone Encounter (Signed)
RX for albuterol has been called in. Nothing further needed

## 2015-08-12 NOTE — Telephone Encounter (Signed)
Spoke with Dalene Seltzer at [facility], states that for pt's albuterol neb, Medicare guidelines will not pay for "prn" medications.  Needs to be sent either "q6h" or "q6h AND as needed".   This rx has been resent according to medicare guidelines.  Nothing further needed.

## 2015-08-17 ENCOUNTER — Telehealth: Payer: Self-pay | Admitting: Internal Medicine

## 2015-08-17 NOTE — Telephone Encounter (Signed)
Spoke with Dalene Seltzer at [facility], pt's albuterol rx is being sent q6h and prn per Medicare guidelines.  Nothing further needed.

## 2015-08-23 ENCOUNTER — Encounter: Payer: Self-pay | Admitting: Cardiology

## 2015-08-30 ENCOUNTER — Encounter: Payer: Self-pay | Admitting: *Deleted

## 2015-08-30 ENCOUNTER — Telehealth: Payer: Self-pay | Admitting: Oncology

## 2015-08-30 NOTE — Telephone Encounter (Signed)
lvm for pt regarding to 1st available appt....advised them to call back to confirm if they want oct appt

## 2015-08-31 ENCOUNTER — Other Ambulatory Visit: Payer: 59

## 2015-08-31 ENCOUNTER — Other Ambulatory Visit: Payer: Self-pay | Admitting: Oncology

## 2015-08-31 ENCOUNTER — Ambulatory Visit: Payer: 59 | Admitting: Oncology

## 2015-08-31 DIAGNOSIS — D509 Iron deficiency anemia, unspecified: Secondary | ICD-10-CM

## 2015-09-02 ENCOUNTER — Encounter: Payer: Self-pay | Admitting: Cardiovascular Disease

## 2015-09-06 ENCOUNTER — Encounter: Payer: Self-pay | Admitting: Cardiology

## 2015-09-08 ENCOUNTER — Other Ambulatory Visit: Payer: Self-pay | Admitting: Internal Medicine

## 2015-09-08 ENCOUNTER — Other Ambulatory Visit (HOSPITAL_COMMUNITY): Payer: Self-pay | Admitting: Cardiology

## 2015-09-09 NOTE — Telephone Encounter (Signed)
REFILL 

## 2015-09-29 ENCOUNTER — Telehealth: Payer: Self-pay | Admitting: Oncology

## 2015-09-29 NOTE — Telephone Encounter (Signed)
Gave pt new sched

## 2015-10-01 ENCOUNTER — Ambulatory Visit (INDEPENDENT_AMBULATORY_CARE_PROVIDER_SITE_OTHER)
Admission: RE | Admit: 2015-10-01 | Discharge: 2015-10-01 | Disposition: A | Discharge status: Home / Self Care | Payer: Medicare Other | Source: Ambulatory Visit | Attending: Vascular Surgery | Admitting: Vascular Surgery

## 2015-10-01 ENCOUNTER — Ambulatory Visit (HOSPITAL_COMMUNITY)
Admission: RE | Admit: 2015-10-01 | Discharge: 2015-10-01 | Disposition: A | Discharge status: Home / Self Care | Payer: Medicare Other | Source: Ambulatory Visit | Attending: Vascular Surgery | Admitting: Vascular Surgery

## 2015-10-01 DIAGNOSIS — Z48812 Encounter for surgical aftercare following surgery on the circulatory system: Secondary | ICD-10-CM | POA: Diagnosis not present

## 2015-10-01 DIAGNOSIS — I6523 Occlusion and stenosis of bilateral carotid arteries: Secondary | ICD-10-CM | POA: Diagnosis not present

## 2015-10-01 DIAGNOSIS — I714 Abdominal aortic aneurysm, without rupture, unspecified: Secondary | ICD-10-CM

## 2015-10-01 DIAGNOSIS — I779 Disorder of arteries and arterioles, unspecified: Secondary | ICD-10-CM | POA: Diagnosis not present

## 2015-10-06 ENCOUNTER — Encounter: Payer: Self-pay | Admitting: Vascular Surgery

## 2015-10-08 ENCOUNTER — Other Ambulatory Visit: Payer: Self-pay | Admitting: Thoracic Surgery (Cardiothoracic Vascular Surgery)

## 2015-10-08 ENCOUNTER — Other Ambulatory Visit (HOSPITAL_COMMUNITY): Payer: Medicare Other

## 2015-10-08 ENCOUNTER — Ambulatory Visit: Payer: Medicare Other | Admitting: Vascular Surgery

## 2015-10-08 ENCOUNTER — Ambulatory Visit (INDEPENDENT_AMBULATORY_CARE_PROVIDER_SITE_OTHER): Payer: Medicare Other | Admitting: Vascular Surgery

## 2015-10-08 ENCOUNTER — Encounter (HOSPITAL_COMMUNITY): Payer: Medicare Other

## 2015-10-08 ENCOUNTER — Encounter: Payer: Self-pay | Admitting: Vascular Surgery

## 2015-10-08 VITALS — BP 127/59 | HR 56 | Ht 68.0 in | Wt 270.6 lb

## 2015-10-08 DIAGNOSIS — I6522 Occlusion and stenosis of left carotid artery: Secondary | ICD-10-CM | POA: Diagnosis not present

## 2015-10-08 DIAGNOSIS — R918 Other nonspecific abnormal finding of lung field: Secondary | ICD-10-CM

## 2015-10-08 DIAGNOSIS — I255 Ischemic cardiomyopathy: Secondary | ICD-10-CM | POA: Diagnosis not present

## 2015-10-08 DIAGNOSIS — I714 Abdominal aortic aneurysm, without rupture, unspecified: Secondary | ICD-10-CM

## 2015-10-08 DIAGNOSIS — I779 Disorder of arteries and arterioles, unspecified: Secondary | ICD-10-CM | POA: Diagnosis not present

## 2015-10-08 DIAGNOSIS — I63231 Cerebral infarction due to unspecified occlusion or stenosis of right carotid arteries: Secondary | ICD-10-CM

## 2015-10-08 DIAGNOSIS — I739 Peripheral vascular disease, unspecified: Secondary | ICD-10-CM | POA: Diagnosis not present

## 2015-10-08 DIAGNOSIS — I63239 Cerebral infarction due to unspecified occlusion or stenosis of unspecified carotid arteries: Secondary | ICD-10-CM | POA: Insufficient documentation

## 2015-10-08 NOTE — Addendum Note (Signed)
Addended by: Dorthula Rue L on: 10/08/2015 11:35 AM   Modules accepted: Orders

## 2015-10-08 NOTE — Progress Notes (Signed)
Established EVAR  History of Present Illness  [name] is a 69 y.o. (1946-08-08) male who presents for routine follow up s/p EVAR (Date: 01/06/15).  Most recent EVAR duplex (Date: 04/02/15) demonstrates: no endoleak and decreased sac size.  Most recent CTA (Date: 02/19/15) demonstrates: no endoleak and decreasing sac size.  The patient has not had back or abdominal pain.  The patient's PMH, PSH, SH, and FamHx are unchanged from 04/02/15.  Current Outpatient Prescriptions  Medication Sig Dispense Refill  . albuterol (PROVENTIL HFA;VENTOLIN HFA) 108 (90 BASE) MCG/ACT inhaler Inhale 2 puffs into the lungs every 6 (six) hours as needed for wheezing or shortness of breath. 3 Inhaler 4  . albuterol (PROVENTIL) (2.5 MG/3ML) 0.083% nebulizer solution Take 3 mLs (2.5 mg total) by nebulization every 6 (six) hours. 360 mL 6  . amiodarone (PACERONE) 200 MG tablet Take 1 tablet by mouth  daily 90 tablet 0  . aspirin 81 MG tablet Take 81 mg by mouth daily.    Marland Kitchen atorvastatin (LIPITOR) 40 MG tablet Take 1 tablet by mouth  daily 90 tablet 0  . benzonatate (TESSALON) 200 MG capsule Take 1 capsule (200 mg total) by mouth 3 (three) times daily as needed for cough. 21 capsule 0  . carvedilol (COREG) 25 MG tablet Take 1 tablet by mouth  twice a day with meals 180 tablet 0  . clopidogrel (PLAVIX) 75 MG tablet Take 1 tablet by mouth  daily 90 tablet 0  . ENTRESTO 24-26 MG Take 1 tablet by mouth two  times daily 60 tablet 3  . finasteride (PROSCAR) 5 MG tablet Take 5 mg by mouth daily.    . fluticasone (FLONASE) 50 MCG/ACT nasal spray Place 1 spray into both nostrils daily. 16 g 2  . furosemide (LASIX) 80 MG tablet Take 1 tablet by mouth two  times daily 180 tablet 0  . isosorbide mononitrate (IMDUR) 60 MG 24 hr tablet Take 1 tablet by mouth  daily 90 tablet 0  . levothyroxine (SYNTHROID, LEVOTHROID) 50 MCG tablet Take 1 tablet by mouth  daily before breakfast 90 tablet 1  . losartan (COZAAR) 50 MG tablet  Take 1.5 tablets (75 mg total) by mouth 2 (two) times daily. 90 tablet 6  . Multiple Vitamins-Minerals (MENS 50+ MULTI VITAMIN/MIN) TABS Take 1 tablet by mouth daily.     . multivitamin-lutein (OCUVITE-LUTEIN) CAPS Take 1 capsule by mouth daily.      Marland Kitchen NITROSTAT 0.4 MG SL tablet Dissolve one tablet under  the tongue every 5 minutes  as needed for chest pain  (up to 3 tabs in 15 minutes then call 911) 25 tablet 6  . NON FORMULARY at bedtime. CPAP And during the day as needed    . PROLENSA 0.07 % SOLN     . spironolactone (ALDACTONE) 25 MG tablet Take 1 tablet by mouth  daily 90 tablet 3   No current facility-administered medications for this visit.    Allergies  Allergen Reactions  . Tussionex Pennkinetic Er [Hydrocod Polst-Cpm Polst Er] Other (See Comments)    "caused prostate problems"  . Ace Inhibitors Other (See Comments)    REACTION: cough  . Penicillins Other (See Comments)    "childhood allergy"    On ROS today: no intermittent claudication, no stroke of CVA sx   Physical Examination  Filed Vitals:   10/08/15 0906 10/08/15 0908  BP: 127/46 127/59  Pulse: 56   Height: '5\' 8"'$  (1.727 m)   Weight: 270  lb 9.6 oz (122.743 kg)   SpO2: 95%    Body mass index is 41.15 kg/(m^2).  General: A&O x 3, WD, Obese,   Pulmonary: Sym exp, good air movt, CTAB, no rales, rhonchi, & wheezing  Cardiac: RRR, Nl S1, S2, no Murmurs, rubs or gallops  Vascular: Vessel Right Left  Radial Palpable Palpable  Brachial Palpable Palpable  Carotid Palpable, without bruit Palpable, without bruit  Aorta Not palpable [name]  Femoral Palpable Palpable  Popliteal Not palpable Not palpable  PT Not Palpable Not Palpable  DP  Palpable Not Palpable   Gastrointestinal: soft, NTND, no G/R, no HSM, no masses, no CVAT B  Musculoskeletal: M/S 5/5 throughout , Extremities without ischemic changes   Neurologic: Pain and light touch intact in extremities , Motor exam as listed above   Non-Invasive  Vascular Imaging  EVAR Duplex (10/08/2015)  AAA sac size: 4.8 cm x 4.9 cm  no endoleak detected  ABI (Date: 10/08/2015) R:  ABI: 1.06,  DP: bi,  PT: mono,  TBI: 0.72 L:  ABI: 0.87,  DP: mono,  PT: mono,  TBI: 0.54  Carotid Duplex (Date: 10/08/2015):   R ICA known occlusion  R VA: patent and antegrade  L ICA stenosis: widely patent CEA  L VA:  patent and antegrade  Medical Decision Making  [name] is a 69 y.o. male who presents s/p EVAR.  Pt is asymptomatic with decreasing sac size.  S/p L CEA, known RICA occlusion, Known L SFA occlusion  I discussed with the patient the importance of surveillance of the endograft.  The next endograft duplex will be scheduled for 6 months.  The next CTA will be scheduled at 5 years per OVER trial which suggested delayed type II endoleaks at 5 years.  Additionally, he will need B carotid duplex and BLE ABI in 12 months.  The patient will follow up with [place] in 12 months with these studies.  Thank you for allowing [place] to participate in this patient's care.   Adele Barthel, MD [facility] Office: [phone] Pager: [phone]  10/08/2015, 10:00 AM

## 2015-10-13 ENCOUNTER — Encounter (HOSPITAL_COMMUNITY): Payer: Self-pay

## 2015-10-13 ENCOUNTER — Ambulatory Visit (HOSPITAL_COMMUNITY)
Admission: RE | Admit: 2015-10-13 | Discharge: 2015-10-13 | Disposition: A | Discharge status: Home / Self Care | Payer: Medicare Other | Source: Ambulatory Visit | Attending: Cardiology | Admitting: Cardiology

## 2015-10-13 VITALS — BP 124/66 | HR 65 | Ht 68.0 in | Wt 265.8 lb

## 2015-10-13 DIAGNOSIS — J441 Chronic obstructive pulmonary disease with (acute) exacerbation: Secondary | ICD-10-CM

## 2015-10-13 DIAGNOSIS — I5042 Chronic combined systolic (congestive) and diastolic (congestive) heart failure: Secondary | ICD-10-CM | POA: Diagnosis not present

## 2015-10-13 DIAGNOSIS — R0602 Shortness of breath: Secondary | ICD-10-CM | POA: Insufficient documentation

## 2015-10-13 DIAGNOSIS — Z950 Presence of cardiac pacemaker: Secondary | ICD-10-CM | POA: Diagnosis not present

## 2015-10-13 DIAGNOSIS — R05 Cough: Secondary | ICD-10-CM | POA: Insufficient documentation

## 2015-10-13 DIAGNOSIS — Z951 Presence of aortocoronary bypass graft: Secondary | ICD-10-CM

## 2015-10-13 DIAGNOSIS — I739 Peripheral vascular disease, unspecified: Secondary | ICD-10-CM

## 2015-10-13 LAB — LIPID PANEL
Cholesterol: 148 mg/dL (ref 0–200)
HDL: 33 mg/dL — ABNORMAL LOW (ref >40)
LDL Cholesterol: 83 mg/dL (ref 0–99)
Total CHOL/HDL Ratio: 4.5 RATIO
Triglycerides: 162 mg/dL — ABNORMAL HIGH (ref <150)
VLDL: 32 mg/dL (ref 0–40)

## 2015-10-13 LAB — COMPREHENSIVE METABOLIC PANEL
ALT: 27 U/L (ref 17–63)
AST: 27 U/L (ref 15–41)
Albumin: 4.3 g/dL (ref 3.5–5.0)
Alkaline Phosphatase: 70 U/L (ref 38–126)
Anion gap: 8 (ref 5–15)
BUN: 14 mg/dL (ref 6–20)
CO2: 32 mmol/L (ref 22–32)
Calcium: 9.5 mg/dL (ref 8.9–10.3)
Chloride: 101 mmol/L (ref 101–111)
Creatinine, Ser: 1.13 mg/dL (ref 0.61–1.24)
GFR calc Af Amer: >60 mL/min (ref >60)
GFR calc non Af Amer: >60 mL/min (ref >60)
Glucose, Bld: 110 mg/dL — ABNORMAL HIGH (ref 65–99)
Potassium: 5.1 mmol/L (ref 3.5–5.1)
Sodium: 141 mmol/L (ref 135–145)
Total Bilirubin: 1.1 mg/dL (ref 0.3–1.2)
Total Protein: 7.4 g/dL (ref 6.5–8.1)

## 2015-10-13 LAB — TSH: TSH: 3.84 u[IU]/mL (ref 0.350–4.500)

## 2015-10-13 MED ORDER — DOXYCYCLINE HYCLATE 100 MG PO CAPS: ORAL_CAPSULE | ORAL | Status: DC

## 2015-10-13 MED ORDER — PREDNISONE 10 MG PO TABS
ORAL_TABLET | ORAL | Status: DC
Start: 2015-10-13 — End: 2015-11-03

## 2015-10-13 NOTE — Patient Instructions (Signed)
START Prednisone '10mg'$ : take 4 tablets for 3 days, then take 2 tablets for 3 days, then take 1 tablet for 3 days.  START Doxycycline '100mg'$ : take 1 tablet twice a day for 7 days.  USE YOUR HOME NEBULIZER AS NEEDED.  CHEST XRAY TODAY.   Routine lab work today. (cmet lipids tsh) Will notify you of abnormal results  FOLLOW UP: 1 month

## 2015-10-14 DIAGNOSIS — J441 Chronic obstructive pulmonary disease with (acute) exacerbation: Secondary | ICD-10-CM | POA: Insufficient documentation

## 2015-10-14 NOTE — Progress Notes (Signed)
Patient ID: [name], male   DOB: [birth year], 69 y.o.   MRN: [id number] [facility]  10/14/2015 [name]   [birth year]  [id number]  Primary Physicia Tyrone Calico, MD Hematologist: [name]  Pulmonary: [name]  HPI:  [name] is a pleasant 69 yo with a past medical history significant for morbid obesity, systolic heart failure with an EF of 30-35% (January 2014) -> 20-25% (March 2015 - RV normal), VT on amio, CAD s/p CABG 1996, LBBB, COPD, obstructive sleep apnea on CPAP, AAA repair (January 2015) who was admitted to [facility] on 02/24/14 for acute on chronic HF and acute COPD exacerbation. He also had iron deficiency anemia.   Admitted to [facility] July 24 through July 06 2014 with chest pain.  CEs negative. Had a LHC with no change from previous LHC with recommendations to continue medical management. Discharge weight was 255 pounds.   LHC 07/06/14 --No significant change from previous studies.  Left anterior descending (LAD): The LAD is a large vessel. There is a 40% stenosis immediately after the takeoff of a large septal perforator. There are 2 large diagonal branches without significant disease. Left circumflex (LCx): The LCx is occluded proximally. There are left to left collaterals.  Right coronary artery (RCA): The RCA is occluded proximally immediately following the conus branch. There are right to right and left to right collaterals. SVGs from CABG known to be totally occluded.   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   CTA chest/abd/pelvis (3/16) with moderate emphysema, 1.3 cm nodule RUL, left SFA totally occluded, right SFA with significant stenosis, s/p endovascular AAA repair (stable).   He returns for follow up. For the last week or so, he has been more short of breath.  He has dyspnea getting dressed and walking around his house. He has been wheezing.  Weight is not significantly changed.  He has been low on Entresto and taking only 1 tab a day rather than  2.  He has also been having positional vertigo.    Labs 3/15 Cr 1.5 K 5.6 Labs 07/06/14 K 3.9 Creatinine  0.98 Labs 9/15 K 5.1, creatinine 0.96 Labs 11/15 K 3.8, creatinine 0.83, LDL 61, LFTs normal, TSH normal Labs 11/26/14 K 5.0 Creatinine 0.73  Labs 12/08/14 K 3.4 Creatinine 0.83  Labs 3/16 K 4.4, creatinine 1.03, LFTs normal, TSH normal, HCT 37.8, BNP 65 Labs 6/16 K 4.1, creatinine 0.93, TSH normal, LFTs normal  Past Medical History  Diagnosis Date  . Myocardial infarction ([place])   . Hypertension   . CHF (congestive heart failure) ([place])   . COPD (chronic obstructive pulmonary disease) ([place])   . GERD (gastroesophageal reflux disease)   . AAA (abdominal aortic aneurysm) ([place])     followed by Tyrone. Bridgett Small  . Tremor   . Dysrhythmia     ICD-defibrillator  . Carotid artery stenosis     LCEA - Tyrone. Bridgett Small in 2013  . Peripheral vascular disease (H[facility])     LCEA, L renal artery stent, bilat iliac stents, R SFA stenosis  . Adenomatous colon polyp 01/2004  . Diverticulosis   . AVM (arteriovenous malformation)   . CAD (coronary artery disease)     s/p CABGx2 in 1996  . HLD (hyperlipidemia)   . Hypothyroidism   . BPH (benign prostatic hypertrophy)   . Fatigue   . Automatic implantable cardioverter-defibrillator in situ   . OSA on CPAP     AHI durign total sleep 14.69/hr, during REM 50.91/hr  .  H/O hiatal hernia   . Anemia     hx  . Complication of anesthesia     claustrophobic, unabe to lie on back more than 4 hours at time due to back  . Ischemic cardiomyopathy   . Ventricular tachycardia ([place]) 09/09/2014    Amiodarone was started after appropriate defibrillator shocks for ventricular tachycardia in October 2008  . Diabetes mellitus     DIET CONTROLLED- pt states that this was a misdiagnosis, he was treated while in the hospital  but returned home, loss a massive amount of weight and he has not had a problem with his blood sugar since. States everything has been normal for about 3 years.     Current Outpatient Prescriptions  Medication Sig Dispense Refill  . albuterol (PROVENTIL HFA;VENTOLIN HFA) 108 (90 BASE) MCG/ACT inhaler Inhale 2 puffs into the lungs every 6 (six) hours as needed for wheezing or shortness of breath. 3 Inhaler 4  . albuterol (PROVENTIL) (2.5 MG/3ML) 0.083% nebulizer solution Take 3 mLs (2.5 mg total) by nebulization every 6 (six) hours. 360 mL 6  . amiodarone (PACERONE) 200 MG tablet Take 1 tablet by mouth  daily 90 tablet 0  . aspirin 81 MG tablet Take 81 mg by mouth daily.    Marland Kitchen atorvastatin (LIPITOR) 40 MG tablet Take 1 tablet by mouth  daily 90 tablet 0  . benzonatate (TESSALON) 200 MG capsule Take 1 capsule (200 mg total) by mouth 3 (three) times daily as needed for cough. 21 capsule 0  . carvedilol (COREG) 25 MG tablet Take 1 tablet by mouth  twice a day with meals 180 tablet 0  . clopidogrel (PLAVIX) 75 MG tablet Take 1 tablet by mouth  daily 90 tablet 0  . ENTRESTO 24-26 MG Take 1 tablet by mouth two  times daily 60 tablet 3  . finasteride (PROSCAR) 5 MG tablet Take 5 mg by mouth daily.    . fluticasone (FLONASE) 50 MCG/ACT nasal spray Place 1 spray into both nostrils daily. 16 g 2  . furosemide (LASIX) 80 MG tablet Take 1 tablet by mouth two  times daily 180 tablet 0  . isosorbide mononitrate (IMDUR) 60 MG 24 hr tablet Take 1 tablet by mouth  daily 90 tablet 0  . levothyroxine (SYNTHROID, LEVOTHROID) 50 MCG tablet Take 1 tablet by mouth  daily before breakfast 90 tablet 1  . losartan (COZAAR) 50 MG tablet Take 1.5 tablets (75 mg total) by mouth 2 (two) times daily. 90 tablet 6  . Multiple Vitamins-Minerals (MENS 50+ MULTI VITAMIN/MIN) TABS Take 1 tablet by mouth daily.     . multivitamin-lutein (OCUVITE-LUTEIN) CAPS Take 1 capsule by mouth daily.      Marland Kitchen NITROSTAT 0.4 MG SL tablet Dissolve one tablet under  the tongue every 5 minutes  as needed for chest pain  (up to 3 tabs in 15 minutes then call 911) 25 tablet 6  . NON FORMULARY at bedtime.  CPAP And during the day as needed    . PROLENSA 0.07 % SOLN     . spironolactone (ALDACTONE) 25 MG tablet Take 1 tablet by mouth  daily 90 tablet 3  . doxycycline (VIBRAMYCIN) 100 MG capsule Take 1 tablet twice a day for 7 days. 14 capsule 0  . predniSONE (DELTASONE) 10 MG tablet Take 4 tablets for three days then 2 tablets for three days then 1 tablets for three days. 21 tablet 0   No current facility-administered medications for this encounter.  Allergies  Allergen Reactions  . Tussionex Pennkinetic Er [Hydrocod Polst-Cpm Polst Er] Other (See Comments)    "caused prostate problems"  . Ace Inhibitors Other (See Comments)    REACTION: cough  . Penicillins Other (See Comments)    "childhood allergy"    Social History   Social History  . Marital Status: Married    Spouse Name: [name]  . Number of Children: 2  . Years of Education: [name]   Occupational History  . retired Solicitor     Social History Main Topics  . Smoking status: Former Smoker -- 1.50 packs/day for 40 years    Types: Cigarettes    Quit date: 12/11/2002  . Smokeless tobacco: Former Systems developer  . Alcohol Use: No  . Drug Use: No  . Sexual Activity: Not Currently   Other Topics Concern  . Not on file   Social History Narrative    Blood pressure 124/66, pulse 65, height '5\' 8"'$  (1.727 m), weight 265 lb 12 oz (120.543 kg), SpO2 97 %.  General appearance: alert, cooperative and no distress.  Neck: Thick.  No carotid bruit or JVP appreciated. L CEA scar Lungs: Wheezing bilaterally on exam.     Heart: regular rate and rhythm, S1, S2 normal, 1/6 SEM at RUSB.  No S3/S4.  Extremities:  No clubbing/cyanosis/edema.  Compression stockings on. Skin: warm and dry Neurologic: Grossly normal  ASSESSMENT/PLAN:  1. Chronic systolic CHF: Ischemic cardiomyopathy. ECHO 02/2014 EF 20-25%.  [place] CRT-D. NYHA class III symptoms, worsened for the last week. However, he does not appear to be volume overloaded by  exam and weight is not significantly elevated.  He is wheezing on lung exam.  Possible COPD exacerbation.     - Continue lasix 80 mg twice a day.     - Continue carvedilol 25 mg twice a day  - I will give him samples of Entresto 24/26 bid and we will work on getting him coverage for the medication.  - Continue spironolactone 25 mg daily.  - BMET today.  2. CAD s/p CABG: No chest pain. Continue 81 mg aspirin daily and atorvastatin daily.  Check lipids today.  3. VT s/p ICD: On amiodarone for history of ICD discharges due to VT.  LFTs/TSH today.  He should get yearly eye exams.  4. Morbid obesity: Continue to work on diet/exercise.   5. COPD: Moderate to severe. Has lung nodule, positionally not a great candidate for biopsy.  Following with CVTS.  Today, breathing is worse with wheezing, and he does not appear volume overloaded.  I think that he has a COPD exacerbation.  CXR was done today, showing clear lungs with no edema or PNA.  - Prednisone 40 mg daily x 3 days, then 20 mg daily x 3 days, then 10 mg daily x 3 days then stop.  - Doxycycline 100 mg bid x 7 days.  - Use nebulizer 3-4 times/day until symptoms improved.   6. PAD: Occluded left SFA and significant right SFA stenosis on recent CTA.  He does not report claudication and does not have pedal ulcers.  Followed by [facility]. He is on statin.   Followup in 1 month.    [name] 10/14/2015

## 2015-10-16 ENCOUNTER — Other Ambulatory Visit (HOSPITAL_COMMUNITY): Payer: Self-pay | Admitting: Cardiology

## 2015-10-16 ENCOUNTER — Other Ambulatory Visit: Payer: Self-pay | Admitting: Internal Medicine

## 2015-10-18 ENCOUNTER — Other Ambulatory Visit (HOSPITAL_COMMUNITY): Payer: Self-pay | Admitting: *Deleted

## 2015-10-19 ENCOUNTER — Ambulatory Visit
Admission: RE | Admit: 2015-10-19 | Discharge: 2015-10-19 | Disposition: A | Discharge status: Home / Self Care | Payer: Medicare Other | Source: Ambulatory Visit | Attending: Thoracic Surgery (Cardiothoracic Vascular Surgery) | Admitting: Thoracic Surgery (Cardiothoracic Vascular Surgery)

## 2015-10-19 ENCOUNTER — Encounter: Payer: Self-pay | Admitting: Thoracic Surgery (Cardiothoracic Vascular Surgery)

## 2015-10-19 ENCOUNTER — Ambulatory Visit (INDEPENDENT_AMBULATORY_CARE_PROVIDER_SITE_OTHER): Payer: Medicare Other | Admitting: Thoracic Surgery (Cardiothoracic Vascular Surgery)

## 2015-10-19 VITALS — BP 130/73 | HR 61 | Resp 20 | Ht 68.0 in | Wt 271.0 lb

## 2015-10-19 DIAGNOSIS — R911 Solitary pulmonary nodule: Secondary | ICD-10-CM

## 2015-10-19 DIAGNOSIS — I255 Ischemic cardiomyopathy: Secondary | ICD-10-CM | POA: Diagnosis not present

## 2015-10-19 DIAGNOSIS — R918 Other nonspecific abnormal finding of lung field: Secondary | ICD-10-CM

## 2015-10-19 NOTE — Progress Notes (Signed)
[address]       [place] [address]             [phone]       HPI: Mr. Foister returns today for scheduled 3 month follow-up visit.  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 His PMH is complicated and includes CHF, CAD, s/ CABG, s/p multiple stents, VT, ICD, PVD, hypertension, hyperlipidemia, severe COPD, morbid obesity, sleep apnea, and an AAA. In May he had a CT for follow up of an aortic endograft. There was an incidentally noted ground glass nodule centrally in the right upper lobe. A dedicated CT of the chest was done which again showed the nodule. There was no hilar or mediastinal adenopathy. I saw him at that time recommended a follow-up scan in 3 months. That was done in August. There was no interval growth so we decided to repeat the scan 3 months.  He has been having more difficulty with shortness of breath lately. He has noticed some wheezing. He has both COPD and CHF, so it can be difficult to tell which is responsible. His CHF seems to be the most limiting factor. He gets SOB with walking < 100 feet. He can get up one flight of stairs if he "pulls himself up" but is very SOB after doing so. He has lost 10 pounds over the past 3 months by diet. He denies cough, hemoptysis, wheezing, orthopnea and PND.   He has not had any unusual cough, hemoptysis, fever, chills, sweats or weight loss.  Past Medical History  Diagnosis Date  . Myocardial infarction ([place])   . Hypertension   . CHF (congestive heart failure) ([place])   . COPD (chronic obstructive pulmonary disease) ([place])   . GERD (gastroesophageal reflux disease)   . AAA (abdominal aortic aneurysm) ([place])     followed by Dr. Bridgett Larsson  . Tremor   . Dysrhythmia     ICD-defibrillator  . Carotid artery stenosis     LCEA - Dr. Bridgett Larsson in 2013  . Peripheral vascular disease (H[facility])     LCEA, L renal artery stent, bilat iliac stents, R SFA stenosis  . Adenomatous colon polyp 01/2004    . Diverticulosis   . AVM (arteriovenous malformation)   . CAD (coronary artery disease)     s/p CABGx2 in 1996  . HLD (hyperlipidemia)   . Hypothyroidism   . BPH (benign prostatic hypertrophy)   . Fatigue   . Automatic implantable cardioverter-defibrillator in situ   . OSA on CPAP     AHI durign total sleep 14.69/hr, during REM 50.91/hr  . H/O hiatal hernia   . Anemia     hx  . Complication of anesthesia     claustrophobic, unabe to lie on back more than 4 hours at time due to back  . Ischemic cardiomyopathy   . Ventricular tachycardia ([place]) 09/09/2014    Amiodarone was started after appropriate defibrillator shocks for ventricular tachycardia in October 2008  . Diabetes mellitus     DIET CONTROLLED- pt states that this was a misdiagnosis, he was treated while in the hospital  but returned home, loss a massive amount of weight and he has not had a problem with his blood sugar since. States everything has been normal for about 3 years.      Current Outpatient Prescriptions  Medication Sig Dispense Refill  . albuterol (PROVENTIL HFA;VENTOLIN HFA) 108 (90 BASE) MCG/ACT  inhaler Inhale 2 puffs into the lungs every 6 (six) hours as needed for wheezing or shortness of breath. 3 Inhaler 4  . albuterol (PROVENTIL) (2.5 MG/3ML) 0.083% nebulizer solution Take 3 mLs (2.5 mg total) by nebulization every 6 (six) hours. 360 mL 6  . amiodarone (PACERONE) 200 MG tablet Take 1 tablet by mouth  daily 90 tablet 3  . aspirin 81 MG tablet Take 81 mg by mouth daily.    Marland Kitchen atorvastatin (LIPITOR) 40 MG tablet Take 1 tablet by mouth  daily 90 tablet 3  . benzonatate (TESSALON) 200 MG capsule Take 1 capsule (200 mg total) by mouth 3 (three) times daily as needed for cough. 21 capsule 0  . carvedilol (COREG) 25 MG tablet Take 1 tablet by mouth  twice a day with meals 180 tablet 3  . clopidogrel (PLAVIX) 75 MG tablet Take 1 tablet by mouth  daily 90 tablet 3  . doxycycline (VIBRAMYCIN) 100 MG capsule Take 1  tablet twice a day for 7 days. 14 capsule 0  . ENTRESTO 24-26 MG Take 1 tablet by mouth two  times daily 60 tablet 3  . finasteride (PROSCAR) 5 MG tablet Take 5 mg by mouth daily.    . fluticasone (FLONASE) 50 MCG/ACT nasal spray Instill 1 spray in each  nostril daily 16 g 3  . furosemide (LASIX) 80 MG tablet Take 1 tablet by mouth two  times daily 180 tablet 3  . isosorbide mononitrate (IMDUR) 60 MG 24 hr tablet Take 1 tablet by mouth  daily 90 tablet 0  . levothyroxine (SYNTHROID, LEVOTHROID) 50 MCG tablet Take 1 tablet by mouth  daily before breakfast 90 tablet 1  . losartan (COZAAR) 50 MG tablet Take 1.5 tablets (75 mg total) by mouth 2 (two) times daily. 90 tablet 6  . Multiple Vitamins-Minerals (MENS 50+ MULTI VITAMIN/MIN) TABS Take 1 tablet by mouth daily.     . multivitamin-lutein (OCUVITE-LUTEIN) CAPS Take 1 capsule by mouth daily.      Marland Kitchen NITROSTAT 0.4 MG SL tablet Dissolve one tablet under  the tongue every 5 minutes  as needed for chest pain  (up to 3 tabs in 15 minutes then call 911) 25 tablet 6  . NON FORMULARY at bedtime. CPAP And during the day as needed    . predniSONE (DELTASONE) 10 MG tablet Take 4 tablets for three days then 2 tablets for three days then 1 tablets for three days. 21 tablet 0  . PROLENSA 0.07 % SOLN     . spironolactone (ALDACTONE) 25 MG tablet Take 1 tablet by mouth  daily 90 tablet 3   No current facility-administered medications for this visit.    Physical Exam BP 130/73 mmHg  Pulse 61  Resp 20  Ht '5\' 8"'$  (1.727 m)  Wt 271 lb (122.925 kg)  BMI 41.22 kg/m2  SpO2 52% Obese 69 year old man in no acute distress Alert and oriented 3 No cervical or subclavicular adenopathy Cardiac regular rate and rhythm 2/6 murmur Lungs diminished breath sounds bilaterally, faint wheezes bilaterally at the bases  Diagnostic Tests: CT CHEST WITHOUT CONTRAST  TECHNIQUE: Multidetector CT imaging of the chest was performed following the standard protocol without IV  contrast.  COMPARISON: Chest CT 07/27/2015.  FINDINGS: Mediastinum/Lymph Nodes: Heart size is normal. There is no significant pericardial fluid, thickening or pericardial calcification. There is atherosclerosis of the thoracic aorta, the great vessels of the mediastinum and the coronary arteries, including calcified atherosclerotic plaque in the left main, left anterior  descending, left circumflex and right coronary arteries. Status post median sternotomy for CABG. Left-sided pacemaker device in position with lead tips terminating in the right atrial appendage, right ventricular apex, and overlying the anterior wall of the left ventricle via the coronary sinus and coronary veins. No pathologically enlarged mediastinal or hilar lymph nodes. Please note that accurate exclusion of hilar adenopathy is limited on noncontrast CT scans. Esophagus is unremarkable in appearance. No axillary lymphadenopathy.  Lungs/Pleura: 3 mm nodule in the right lower lobe (image 42 of series 4) is unchanged. A ovoid shaped area of ground-glass attenuation, septal thickening and architectural distortion in the right upper lobe again noted, currently measuring 2.3 x 0.8 cm (image 20 of series 4), very similar to the prior study, intimately associated with the right upper lobe bronchus. On coronal image 55 of series 601 there is a 2 mm central solid component within this lesion. This is associated with a focal area of bronchial wall thickening with some very mild cylindrical bronchiectasis in the right upper lobe. No other new suspicious appearing pulmonary nodules or masses are otherwise noted. No acute consolidative airspace disease. No pleural effusions. Mild centrilobular and paraseptal emphysema.  Upper Abdomen: Atherosclerosis. Aortic stent graft incompletely visualized.  Musculoskeletal/Soft Tissues: There are no aggressive appearing lytic or blastic lesions noted in the visualized portions  of the skeleton.  IMPRESSION: 1. The previously described subsolid nodule in the right upper lobe is very similar to prior studies, measuring 2.3 x 0.8 cm on today's examination, with a tiny 2 mm central solid component. This lesion is nonspecific, and given the intimate association with some mildly bronchiectatic bronchi, is favored to represent an area of chronic post infectious or inflammatory scarring. However, repeat noncontrast chest CT is recommended in 12 months to ensure continued stability. This recommendation follows the consensus statement: Recommendations for the Management of Subsolid Pulmonary Nodules Detected at CT: A Statement from the Fleischner Society as published in Radiology 2013; 266:304-317. 2. Atherosclerosis, including left main and 3 vessel coronary artery disease. Status post median sternotomy for CABG. 3. Mild diffuse bronchial wall thickening with mild centrilobular and paraseptal emphysema ; imaging findings suggestive of underlying COPD. 4. Additional incidental findings, as above.   Electronically Signed  By: Vinnie Langton M.D.  On: 10/19/2015 15:15  I personally reviewed the CT chest with findings as noted above  Impression: 69 year old man with multiple significant medical problems. He has a mixed nodule in the right upper lobe. This has not changed over the past 6 months. Although this could represent a malignancy, i.e. an adenocarcinoma with lepidic growth, it most likely is post inflammatory or infectious. It has been stable for 6 months now. I will plan to repeat a CT scan in 6 months for a 1 year follow-up. If stable at that time I think we can go to annual follow-up after that.  Plan:  Return in 6 months with CT of chest [name], MD [facility] [phone]

## 2015-10-22 ENCOUNTER — Telehealth (HOSPITAL_COMMUNITY): Payer: Self-pay | Admitting: *Deleted

## 2015-10-22 NOTE — Telephone Encounter (Signed)
-----   Message from Larey Dresser, MD sent at 10/17/2015  9:11 PM EST ----- Low K diet, make sure no K supplementation

## 2015-10-22 NOTE — Telephone Encounter (Signed)
Left message to call back  

## 2015-11-03 ENCOUNTER — Encounter: Payer: Self-pay | Admitting: Internal Medicine

## 2015-11-03 ENCOUNTER — Ambulatory Visit (INDEPENDENT_AMBULATORY_CARE_PROVIDER_SITE_OTHER): Payer: Medicare Other | Admitting: Internal Medicine

## 2015-11-03 VITALS — BP 112/68 | HR 60 | Ht 68.0 in | Wt 265.8 lb

## 2015-11-03 DIAGNOSIS — I255 Ischemic cardiomyopathy: Secondary | ICD-10-CM | POA: Diagnosis not present

## 2015-11-03 DIAGNOSIS — I5042 Chronic combined systolic (congestive) and diastolic (congestive) heart failure: Secondary | ICD-10-CM

## 2015-11-03 DIAGNOSIS — Z95810 Presence of automatic (implantable) cardiac defibrillator: Secondary | ICD-10-CM | POA: Diagnosis not present

## 2015-11-03 LAB — CUP PACEART INCLINIC DEVICE CHECK
Date Time Interrogation Session: 20161123050000
HighPow Impedance: 38 Ohm
HighPow Impedance: 52 Ohm
Implantable Lead Implant Date: 20060411
Implantable Lead Implant Date: 20151030
Implantable Lead Implant Date: 20151030
Implantable Lead Location: 753858
Implantable Lead Location: 753859
Implantable Lead Location: 753860
Implantable Lead Model: 148
Implantable Lead Model: 4136
Implantable Lead Serial Number: 145070
Implantable Lead Serial Number: 29713415
Lead Channel Impedance Value: 551 Ohm
Lead Channel Impedance Value: 576 Ohm
Lead Channel Impedance Value: 656 Ohm
Lead Channel Pacing Threshold Amplitude: 0.7 V
Lead Channel Pacing Threshold Amplitude: 0.8 V
Lead Channel Pacing Threshold Amplitude: 2.5 V
Lead Channel Pacing Threshold Pulse Width: 0.4 ms
Lead Channel Pacing Threshold Pulse Width: 0.4 ms
Lead Channel Pacing Threshold Pulse Width: 0.9 ms
Lead Channel Sensing Intrinsic Amplitude: 11.7 mV
Lead Channel Sensing Intrinsic Amplitude: 25 mV
Lead Channel Sensing Intrinsic Amplitude: 4.8 mV
Lead Channel Setting Pacing Amplitude: 2 V
Lead Channel Setting Pacing Amplitude: 2 V
Lead Channel Setting Pacing Amplitude: 3.5 V
Lead Channel Setting Pacing Pulse Width: 0.4 ms
Lead Channel Setting Pacing Pulse Width: 0.9 ms
Lead Channel Setting Sensing Sensitivity: 0.6 mV
Lead Channel Setting Sensing Sensitivity: 1 mV
Pulse Gen Serial Number: 370874

## 2015-11-03 NOTE — Assessment & Plan Note (Signed)
His symptoms appear to be improved slightly. He will continue his current meds. I have asked him to check his weight, blood presssure and date and record prior to his visit back with [name].

## 2015-11-03 NOTE — Progress Notes (Signed)
HPI [name] returns today for ICD followup. He is a very pleasant 69 year old man with chronic systolic heart failure, class III, left bundle branch block, with a QRS duration of 135 ms, prior coronary artery disease and bypass grafting, with multiple remote stents status post percutaneous coronary intervention, ventricular tachycardia with VT storm in 2008, managed with chronic suppressive amiodarone therapy.  He admits to some dietary indiscretion, but he is trying to lose weight. He is s/p BiV ICD upgrade several months ago. He still has class 3A CHF (from class 3B). He feels better but still has periods where he feels weak. Allergies  Allergen Reactions  . Tussionex Pennkinetic Er [Hydrocod Polst-Cpm Polst Er] Other (See Comments)    "caused prostate problems"  . Ace Inhibitors Other (See Comments)    REACTION: cough  . Penicillins Other (See Comments)    "childhood allergy"     Current Outpatient Prescriptions  Medication Sig Dispense Refill  . albuterol (PROVENTIL HFA;VENTOLIN HFA) 108 (90 BASE) MCG/ACT inhaler Inhale 2 puffs into the lungs every 6 (six) hours as needed for wheezing or shortness of breath. 3 Inhaler 4  . albuterol (PROVENTIL) (2.5 MG/3ML) 0.083% nebulizer solution Take 3 mLs (2.5 mg total) by nebulization every 6 (six) hours. 360 mL 6  . amiodarone (PACERONE) 200 MG tablet Take 1 tablet by mouth  daily 90 tablet 3  . aspirin 81 MG tablet Take 81 mg by mouth daily.    Marland Kitchen atorvastatin (LIPITOR) 40 MG tablet Take 1 tablet by mouth  daily 90 tablet 3  . benzonatate (TESSALON) 200 MG capsule Take 1 capsule (200 mg total) by mouth 3 (three) times daily as needed for cough. 21 capsule 0  . carvedilol (COREG) 25 MG tablet Take 1 tablet by mouth  twice a day with meals 180 tablet 3  . clopidogrel (PLAVIX) 75 MG tablet Take 1 tablet by mouth  daily 90 tablet 3  . ENTRESTO 24-26 MG Take 1 tablet by mouth two  times daily 60 tablet 3  . finasteride (PROSCAR) 5 MG tablet Take 5  mg by mouth daily.    . fluticasone (FLONASE) 50 MCG/ACT nasal spray Instill 1 spray in each  nostril daily 16 g 3  . furosemide (LASIX) 80 MG tablet Take 1 tablet by mouth two  times daily 180 tablet 3  . isosorbide mononitrate (IMDUR) 60 MG 24 hr tablet Take 1 tablet by mouth  daily 90 tablet 0  . levothyroxine (SYNTHROID, LEVOTHROID) 50 MCG tablet Take 1 tablet by mouth  daily before breakfast 90 tablet 1  . losartan (COZAAR) 50 MG tablet Take 1.5 tablets (75 mg total) by mouth 2 (two) times daily. 90 tablet 6  . Multiple Vitamins-Minerals (MENS 50+ MULTI VITAMIN/MIN) TABS Take 1 tablet by mouth daily.     . multivitamin-lutein (OCUVITE-LUTEIN) CAPS Take 1 capsule by mouth daily.      Marland Kitchen NITROSTAT 0.4 MG SL tablet Dissolve one tablet under  the tongue every 5 minutes  as needed for chest pain  (up to 3 tabs in 15 minutes then call 911) 25 tablet 6  . NON FORMULARY at bedtime. CPAP And during the day as needed    . spironolactone (ALDACTONE) 25 MG tablet Take 1 tablet by mouth  daily 90 tablet 3   No current facility-administered medications for this visit.     Past Medical History  Diagnosis Date  . Myocardial infarction ([place])   . Hypertension   . CHF (congestive heart  failure) ([place])   . COPD (chronic obstructive pulmonary disease) ([place])   . GERD (gastroesophageal reflux disease)   . AAA (abdominal aortic aneurysm) ([place])     followed by Dr. Bridgett Larsson  . Tremor   . Dysrhythmia     ICD-defibrillator  . Carotid artery stenosis     LCEA - Dr. Bridgett Larsson in 2013  . Peripheral vascular disease (HCC)     LCEA, L renal artery stent, bilat iliac stents, R SFA stenosis  . Adenomatous colon polyp 01/2004  . Diverticulosis   . AVM (arteriovenous malformation)   . CAD (coronary artery disease)     s/p CABGx2 in 1996  . HLD (hyperlipidemia)   . Hypothyroidism   . BPH (benign prostatic hypertrophy)   . Fatigue   . Automatic implantable cardioverter-defibrillator in situ   . OSA on CPAP     AHI  durign total sleep 14.69/hr, during REM 50.91/hr  . H/O hiatal hernia   . Anemia     hx  . Complication of anesthesia     claustrophobic, unabe to lie on back more than 4 hours at time due to back  . Ischemic cardiomyopathy   . Ventricular tachycardia ([place]) 09/09/2014    Amiodarone was started after appropriate defibrillator shocks for ventricular tachycardia in October 2008  . Diabetes mellitus     DIET CONTROLLED- pt states that this was a misdiagnosis, he was treated while in the hospital  but returned home, loss a massive amount of weight and he has not had a problem with his blood sugar since. States everything has been normal for about 3 years.    ROS:   All systems reviewed and negative except as noted in the HPI.   Past Surgical History  Procedure Laterality Date  . Coronary artery bypass graft  11/04/1985    x2 - PDA and sequential DX-OM (Dr. Redmond Pulling)  . Cardiac catheterization  09/16/2007    occlusion of both vein grafts, no significant LAD disease or diagonal disease, Cfx collaterals from the left, 70% in-stent restenosis of L renal artery, normal L main, RCA occluded ostially (Dr. Adora Fridge)  . Coronary angioplasty  01/08/2004    cutting balloon atherectomy & percutaneous intervention of RCIA in-stent restenosis (Dr. Marella Chimes)  . Renal artery stent Left 03/24/2004    6x54m Genesis stent (Dr. RMarella Chimes  . Angioplasty      BILATERAL  LE  W/STENTS  . Endarterectomy  01/04/2012    Procedure: ENDARTERECTOMY CAROTID;left  Surgeon: BHinda Lenis MD;  Location: [facility]  Service: Vascular;  Laterality: Left;  with patch angioplasty  . Carotid endarterectomy Left 01/04/12  . Cardiac defibrillator placement  06/2005    Guidant Vitality HE - ischemic cardiomyopathy - Dr. RMarella Chimes . Iron infusion  June 16, 2012  . Abdominal aortic endovascular stent graft [name] 01/06/2014    Procedure: ABDOMINAL AORTIC ENDOVASCULAR STENT GRAFT- GORE; ULTRASOUND GUIDED;  Surgeon: BConrad Carlos MD;  Location[facility]  Service: Vascular;  Laterality: [name];  . Polypectomy    . Iliac artery stent Bilateral 03/1997    and L SFA PTA  . Icd generator change  05/02/2010    [employer] . Transthoracic echocardiogram  12/2012    EF 30-35; LV mod to severely dilated, mod concentric hypertrophy, severe hypokinesis of inferolateral myocardium, moderate hypokineis of anteroseptal region, grade 1 diastolic dysfunction; mod MR; LA mod-severely dialted; RV mod dialted; RA mildly dilated; PA peak pressure 396mg  .  Nm myocar perf wall motion  09/2012    lexiscan myoview - mod-severe perfusion defect r/t infarct or scar w/mild periinfarct ishcemia in basal inferior, mid inferior, apical inferior, basal inferolateral & mid inferoalteral regions - EF 21% low risk scan  . Cardiac catheterization  10/03/2002    SVG sequentially to OM1 & OM2 totally occluded at ostium, SVG to PDA totally occluded within previously placed prox vein graft stent, smal distal AAA, bialt iliac stents with 30% left end-stent restenosis and 50% right end-stent restnosis(Dr. Gerrie Nordmann)  . Cardiac catheterization  06/11/1998    L main with 20% narrowing in distal 1/3; LAD with 1st diagonla having 70% ostial narrowing, 2nd diagonal with 40% narrowing in prox third, LIMA & RIMA widely patent; in-stent restenosis of RCIA with successful PTA and new prox SVTRCA stent for residual disease (Dr. Marella Chimes)  . Biv icd genertaor change out  10/09/2014    UPGRADE TO BIV        BY DR Lovena Le  . Left heart catheterization with coronary angiogram [name] 07/06/2014    Procedure: LEFT HEART CATHETERIZATION WITH CORONARY ANGIOGRAM;  Surgeon: Peter M Martinique, MD;  Location[facility] C[facility] LAB;  Service: Cardiovascular;  Laterality: [name];  . Bi-ventricular implantable cardioverter defibrillator upgrade [name] 10/09/2014    Procedure: BI-VENTRICULAR IMPLANTABLE CARDIOVERTER DEFIBRILLATOR UPGRADE;  Surgeon: Evans Lance, MD;  Location: [facility] CATH LAB;   Service: Cardiovascular;  Laterality: [name];     Family History  Problem Relation Age of Onset  . Colon cancer Sister   . Cancer Sister   . Hypertension Sister   . Hyperlipidemia Sister   . Diabetes Sister   . Heart disease Sister     before age 27  . Heart disease Brother   . Lung cancer Mother   . Cancer Mother   . Diabetes Mother   . Hypertension Mother   . Hyperlipidemia Mother   . Heart disease Mother     before age 71  . Heart attack Maternal Grandmother   . Colon polyps Sister   . Heart attack Father   . Heart disease Father     before age 72  . Hypertension Father   . COPD Sister   . Diabetes Daughter      Social History   Social History  . Marital Status: Married    Spouse Name: [name]  . Number of Children: 2  . Years of Education: [name]   Occupational History  . retired Solicitor     Social History Main Topics  . Smoking status: Former Smoker -- 1.50 packs/day for 40 years    Types: Cigarettes    Quit date: 12/11/2002  . Smokeless tobacco: Former Systems developer  . Alcohol Use: No  . Drug Use: No  . Sexual Activity: Not Currently   Other Topics Concern  . Not on file   Social History Narrative     BP 112/68 mmHg  Pulse 60  Ht '5\' 8"'$  (1.727 m)  Wt 265 lb 12.8 oz (120.566 kg)  BMI 40.42 kg/m2  Physical Exam:  Well appearing obese man, NAD HEENT: Unremarkable Neck:  No JVD, no thyromegally Back:  No CVA tenderness Lungs:  Clear with no wheezes, rales, or rhonchi. HEART:  Regular rate rhythm, no murmurs, no rubs, no clicks Abd:  soft, obese, positive bowel sounds, no organomegally, no rebound, no guarding Ext:  2 plus pulses, 1+ peripheral edema, no cyanosis, no clubbing Skin:  No rashes no nodules Neuro:  CN II through XII intact, motor grossly intact  EKG - normal sinus rhythm with BiV pacing  DEVICE  Normal device function.  See PaceArt for details.   Assess/Plan:

## 2015-11-03 NOTE — Assessment & Plan Note (Signed)
He denies anginal symptoms. He is encouraged to maintain a low sodium diet and increase his physical activity.

## 2015-11-03 NOTE — Assessment & Plan Note (Signed)
His [employer] ICD is working normally. Will recheck in several months. 

## 2015-11-03 NOTE — Patient Instructions (Signed)
Medication Instructions:  Your physician recommends that you continue on your current medications as directed. Please refer to the Current Medication list given to you today.   Labwork: None ordered   Testing/Procedures: None ordered   Follow-Up: Your physician wants you to follow-up in: 12 months with Dr Knox Saliva will receive a reminder letter in the mail two months in advance. If you don't receive a letter, please call our office to schedule the follow-up appointment.  Remote monitoring is used to monitor your ICD from home. This monitoring reduces the number of office visits required to check your device to one time per year. It allows [place] to keep an eye on the functioning of your device to ensure it is working properly. You are scheduled for a device check from home on 02/02/16. You may send your transmission at any time that day. If you have a wireless device, the transmission will be sent automatically. After your physician reviews your transmission, you will receive a postcard with your next transmission date.     Any Other Special Instructions Will Be Listed Below (If Applicable).     If you need a refill on your cardiac medications before your next appointment, please call your pharmacy.

## 2015-11-12 ENCOUNTER — Ambulatory Visit (HOSPITAL_COMMUNITY)
Admission: RE | Admit: 2015-11-12 | Discharge: 2015-11-12 | Disposition: A | Discharge status: Home / Self Care | Payer: Medicare Other | Source: Ambulatory Visit | Attending: Cardiology | Admitting: Cardiology

## 2015-11-12 VITALS — BP 124/52 | HR 69 | Wt 258.0 lb

## 2015-11-12 DIAGNOSIS — I714 Abdominal aortic aneurysm, without rupture: Secondary | ICD-10-CM | POA: Insufficient documentation

## 2015-11-12 DIAGNOSIS — Z888 Allergy status to other drugs, medicaments and biological substances status: Secondary | ICD-10-CM | POA: Diagnosis not present

## 2015-11-12 DIAGNOSIS — D509 Iron deficiency anemia, unspecified: Secondary | ICD-10-CM | POA: Diagnosis not present

## 2015-11-12 DIAGNOSIS — E039 Hypothyroidism, unspecified: Secondary | ICD-10-CM | POA: Diagnosis not present

## 2015-11-12 DIAGNOSIS — I5022 Chronic systolic (congestive) heart failure: Secondary | ICD-10-CM | POA: Diagnosis not present

## 2015-11-12 DIAGNOSIS — J441 Chronic obstructive pulmonary disease with (acute) exacerbation: Secondary | ICD-10-CM | POA: Diagnosis not present

## 2015-11-12 DIAGNOSIS — I255 Ischemic cardiomyopathy: Secondary | ICD-10-CM | POA: Diagnosis not present

## 2015-11-12 DIAGNOSIS — N400 Enlarged prostate without lower urinary tract symptoms: Secondary | ICD-10-CM | POA: Diagnosis not present

## 2015-11-12 DIAGNOSIS — Z79899 Other long term (current) drug therapy: Secondary | ICD-10-CM | POA: Diagnosis not present

## 2015-11-12 DIAGNOSIS — E119 Type 2 diabetes mellitus without complications: Secondary | ICD-10-CM | POA: Insufficient documentation

## 2015-11-12 DIAGNOSIS — I252 Old myocardial infarction: Secondary | ICD-10-CM | POA: Diagnosis not present

## 2015-11-12 DIAGNOSIS — K219 Gastro-esophageal reflux disease without esophagitis: Secondary | ICD-10-CM | POA: Insufficient documentation

## 2015-11-12 DIAGNOSIS — Z87891 Personal history of nicotine dependence: Secondary | ICD-10-CM | POA: Insufficient documentation

## 2015-11-12 DIAGNOSIS — G4733 Obstructive sleep apnea (adult) (pediatric): Secondary | ICD-10-CM | POA: Insufficient documentation

## 2015-11-12 DIAGNOSIS — Z880 Allergy status to penicillin: Secondary | ICD-10-CM | POA: Diagnosis not present

## 2015-11-12 DIAGNOSIS — Z951 Presence of aortocoronary bypass graft: Secondary | ICD-10-CM | POA: Diagnosis not present

## 2015-11-12 DIAGNOSIS — Z8679 Personal history of other diseases of the circulatory system: Secondary | ICD-10-CM | POA: Insufficient documentation

## 2015-11-12 DIAGNOSIS — I5042 Chronic combined systolic (congestive) and diastolic (congestive) heart failure: Secondary | ICD-10-CM

## 2015-11-12 DIAGNOSIS — I447 Left bundle-branch block, unspecified: Secondary | ICD-10-CM | POA: Diagnosis not present

## 2015-11-12 DIAGNOSIS — I739 Peripheral vascular disease, unspecified: Secondary | ICD-10-CM | POA: Insufficient documentation

## 2015-11-12 DIAGNOSIS — Z95810 Presence of automatic (implantable) cardiac defibrillator: Secondary | ICD-10-CM | POA: Diagnosis not present

## 2015-11-12 DIAGNOSIS — I10 Essential (primary) hypertension: Secondary | ICD-10-CM | POA: Insufficient documentation

## 2015-11-12 DIAGNOSIS — R911 Solitary pulmonary nodule: Secondary | ICD-10-CM | POA: Insufficient documentation

## 2015-11-12 DIAGNOSIS — Z7982 Long term (current) use of aspirin: Secondary | ICD-10-CM | POA: Diagnosis not present

## 2015-11-12 DIAGNOSIS — I2510 Atherosclerotic heart disease of native coronary artery without angina pectoris: Secondary | ICD-10-CM | POA: Insufficient documentation

## 2015-11-12 DIAGNOSIS — E785 Hyperlipidemia, unspecified: Secondary | ICD-10-CM | POA: Insufficient documentation

## 2015-11-12 LAB — BASIC METABOLIC PANEL
Anion gap: 7 (ref 5–15)
BUN: 18 mg/dL (ref 6–20)
CO2: 28 mmol/L (ref 22–32)
Calcium: 9.2 mg/dL (ref 8.9–10.3)
Chloride: 105 mmol/L (ref 101–111)
Creatinine, Ser: 1 mg/dL (ref 0.61–1.24)
GFR calc Af Amer: >60 mL/min (ref >60)
GFR calc non Af Amer: >60 mL/min (ref >60)
Glucose, Bld: 111 mg/dL — ABNORMAL HIGH (ref 65–99)
Potassium: 4.3 mmol/L (ref 3.5–5.1)
Sodium: 140 mmol/L (ref 135–145)

## 2015-11-12 LAB — BRAIN NATRIURETIC PEPTIDE: B Natriuretic Peptide: 111.3 pg/mL — ABNORMAL HIGH (ref 0.0–100.0)

## 2015-11-12 NOTE — Patient Instructions (Signed)
Labs today will call if abnormal   Follow up in 2 months 

## 2015-11-14 ENCOUNTER — Other Ambulatory Visit: Payer: Self-pay | Admitting: Internal Medicine

## 2015-11-14 NOTE — Progress Notes (Signed)
Patient ID: [name], male   DOB: 1946/08/11, 69 y.o.   MRN: [id number] [facility]  11/14/2015 [name]   September 21, 1970  [id number]  Primary Physicia [name], MD Hematologist: Dr Alen Blew  Pulmonary: Dr Annamaria Boots  HPI:  [name] is a pleasant 69 yo with a past medical history significant for morbid obesity, systolic heart failure with an EF of 30-35% (January 2014) -> 20-25% (March 2015 - RV normal), VT on amio, CAD s/p CABG 1996, LBBB, COPD, obstructive sleep apnea on CPAP, AAA repair (January 2015) who was admitted to [facility] on 02/24/14 for acute on chronic HF and acute COPD exacerbation. He also had iron deficiency anemia.   Admitted to [facility] July 24 through July 06 2014 with chest pain.  CEs negative. Had a LHC with no change from previous LHC with recommendations to continue medical management. Discharge weight was 255 pounds.   LHC 07/06/14 --No significant change from previous studies.  Left anterior descending (LAD): The LAD is a large vessel. There is a 40% stenosis immediately after the takeoff of a large septal perforator. There are 2 large diagonal branches without significant disease. Left circumflex (LCx): The LCx is occluded proximally. There are left to left collaterals.  Right coronary artery (RCA): The RCA is occluded proximally immediately following the conus branch. There are right to right and left to right collaterals. SVGs from CABG known to be totally occluded.   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   CTA chest/abd/pelvis (3/16) with moderate emphysema, 1.3 cm nodule RUL, left SFA totally occluded, right SFA with significant stenosis, s/p endovascular AAA repair (stable).   He returns for follow up.  He was treated for a COPD exacerbation at last appointment and feels better overall.  Still very limited.  Dyspnea walking in stores.  Generalized fatigue.  Has to walk slowly.  Can get around and do most of what he wants to do.  Weight is down 7  lbs. BP readings from home are stable.   Labs 3/15 Cr 1.5 K 5.6 Labs 07/06/14 K 3.9 Creatinine  0.98 Labs 9/15 K 5.1, creatinine 0.96 Labs 11/15 K 3.8, creatinine 0.83, LDL 61, LFTs normal, TSH normal Labs 11/26/14 K 5.0 Creatinine 0.73  Labs 12/08/14 K 3.4 Creatinine 0.83  Labs 3/16 K 4.4, creatinine 1.03, LFTs normal, TSH normal, HCT 37.8, BNP 65 Labs 6/16 K 4.1, creatinine 0.93, TSH normal, LFTs normal Labs 11/16 K 5.1, creatinine 1.13, LFTs normal, TSH normal, LDL 83, HDL 33, TGs 162  Past Medical History  Diagnosis Date  . Myocardial infarction ([place])   . Hypertension   . CHF (congestive heart failure) ([place])   . COPD (chronic obstructive pulmonary disease) ([place])   . GERD (gastroesophageal reflux disease)   . AAA (abdominal aortic aneurysm) ([place])     followed by Dr. Bridgett Larsson  . Tremor   . Dysrhythmia     ICD-defibrillator  . Carotid artery stenosis     LCEA - Dr. Bridgett Larsson in 2013  . Peripheral vascular disease (HCC)     LCEA, L renal artery stent, bilat iliac stents, R SFA stenosis  . Adenomatous colon polyp 01/2004  . Diverticulosis   . AVM (arteriovenous malformation)   . CAD (coronary artery disease)     s/p CABGx2 in 1996  . HLD (hyperlipidemia)   . Hypothyroidism   . BPH (benign prostatic hypertrophy)   . Fatigue   . Automatic implantable cardioverter-defibrillator in situ   . OSA on CPAP  AHI durign total sleep 14.69/hr, during REM 50.91/hr  . H/O hiatal hernia   . Anemia     hx  . Complication of anesthesia     claustrophobic, unabe to lie on back more than 4 hours at time due to back  . Ischemic cardiomyopathy   . Ventricular tachycardia ([place]) 09/09/2014    Amiodarone was started after appropriate defibrillator shocks for ventricular tachycardia in October 2008  . Diabetes mellitus     DIET CONTROLLED- pt states that this was a misdiagnosis, he was treated while in the hospital  but returned home, loss a massive amount of weight and he has not had a problem with  his blood sugar since. States everything has been normal for about 3 years.    Current Outpatient Prescriptions  Medication Sig Dispense Refill  . albuterol (PROVENTIL HFA;VENTOLIN HFA) 108 (90 BASE) MCG/ACT inhaler Inhale 2 puffs into the lungs every 6 (six) hours as needed for wheezing or shortness of breath. 3 Inhaler 4  . albuterol (PROVENTIL) (2.5 MG/3ML) 0.083% nebulizer solution Take 3 mLs (2.5 mg total) by nebulization every 6 (six) hours. 360 mL 6  . amiodarone (PACERONE) 200 MG tablet Take 1 tablet by mouth  daily 90 tablet 3  . aspirin 81 MG tablet Take 81 mg by mouth daily.    Marland Kitchen atorvastatin (LIPITOR) 40 MG tablet Take 1 tablet by mouth  daily 90 tablet 3  . carvedilol (COREG) 25 MG tablet Take 1 tablet by mouth  twice a day with meals 180 tablet 3  . clopidogrel (PLAVIX) 75 MG tablet Take 1 tablet by mouth  daily 90 tablet 3  . ENTRESTO 24-26 MG Take 1 tablet by mouth two  times daily 60 tablet 3  . finasteride (PROSCAR) 5 MG tablet Take 5 mg by mouth daily.    . fluticasone (FLONASE) 50 MCG/ACT nasal spray Instill 1 spray in each  nostril daily 16 g 3  . furosemide (LASIX) 80 MG tablet Take 1 tablet by mouth two  times daily 180 tablet 3  . isosorbide mononitrate (IMDUR) 60 MG 24 hr tablet Take 1 tablet by mouth  daily 90 tablet 0  . levothyroxine (SYNTHROID, LEVOTHROID) 50 MCG tablet Take 1 tablet by mouth  daily before breakfast 90 tablet 1  . Multiple Vitamins-Minerals (MENS 50+ MULTI VITAMIN/MIN) TABS Take 1 tablet by mouth daily.     . multivitamin-lutein (OCUVITE-LUTEIN) CAPS Take 1 capsule by mouth daily.      Marland Kitchen NITROSTAT 0.4 MG SL tablet Dissolve one tablet under  the tongue every 5 minutes  as needed for chest pain  (up to 3 tabs in 15 minutes then call 911) 25 tablet 6  . NON FORMULARY at bedtime. CPAP And during the day as needed    . spironolactone (ALDACTONE) 25 MG tablet Take 1 tablet by mouth  daily 90 tablet 3   No current facility-administered medications for  this encounter.    Allergies  Allergen Reactions  . Tussionex Pennkinetic Er [Hydrocod Polst-Cpm Polst Er] Other (See Comments)    "caused prostate problems"  . Ace Inhibitors Other (See Comments)    REACTION: cough  . Penicillins Other (See Comments)    "childhood allergy"    Social History   Social History  . Marital Status: Married    Spouse Name: [name]  . Number of Children: 2  . Years of Education: [name]   Occupational History  . retired Solicitor     Social  History Main Topics  . Smoking status: Former Smoker -- 1.50 packs/day for 40 years    Types: Cigarettes    Quit date: 12/11/2002  . Smokeless tobacco: Former Systems developer  . Alcohol Use: No  . Drug Use: No  . Sexual Activity: Not Currently   Other Topics Concern  . Not on file   Social History Narrative    Blood pressure 124/52, pulse 69, weight 258 lb (117.028 kg), SpO2 98 %.  General appearance: alert, cooperative and no distress.  Neck: Thick.  No carotid bruit or JVP appreciated. L CEA scar Lungs: Clear bilaterally.    Heart: regular rate and rhythm, S1, S2 normal, 1/6 SEM at RUSB.  No S3/S4.  Extremities:  No clubbing/cyanosis/edema.  Compression stockings on. Skin: warm and dry Neurologic: Grossly normal  ASSESSMENT/PLAN:  1. Chronic systolic CHF: Ischemic cardiomyopathy. ECHO 02/2014 EF 20-25%.  [employer] CRT-D. NYHA class III symptoms, stable.  I think that COPD plays a role in his dyspnea. He looks near-euvolemic. - Continue lasix 80 mg twice a day.     - Continue carvedilol 25 mg twice a day  - Continue Entresto 24/26 bid. - Continue spironolactone 25 mg daily.  - BMET today/BNP today.  2. CAD s/p CABG: No chest pain. Continue 81 mg aspirin daily and atorvastatin daily.  Good lipids when checked in 11/16. 3. VT s/p ICD: On amiodarone for history of ICD discharges due to VT.  LFTs/TSH recently normal.  He should get yearly eye exams.  4. Morbid obesity: Continue to work on  diet/exercise, he is losing weight.   5. COPD: Moderate to severe. Has lung nodule, positionally not a great candidate for biopsy.  Following with [facility], to get CT chest in 6 months.  I think that COPD plays a significant role in his symptoms. 6. PAD: Occluded left SFA and significant right SFA stenosis on recent CTA.  He does not report claudication and does not have pedal ulcers.  Followed by [facility]. He is on statin.   Followup in 2 months.    [name] 11/14/2015

## 2015-11-15 NOTE — Telephone Encounter (Signed)
REFILL 

## 2015-11-16 ENCOUNTER — Other Ambulatory Visit (HOSPITAL_BASED_OUTPATIENT_CLINIC_OR_DEPARTMENT_OTHER): Payer: Medicare Other

## 2015-11-16 ENCOUNTER — Telehealth: Payer: Self-pay | Admitting: Oncology

## 2015-11-16 ENCOUNTER — Ambulatory Visit (HOSPITAL_BASED_OUTPATIENT_CLINIC_OR_DEPARTMENT_OTHER): Payer: Medicare Other | Admitting: Oncology

## 2015-11-16 VITALS — BP 142/62 | HR 70 | Temp 97.7°F | Resp 18 | Ht 68.0 in | Wt 261.0 lb

## 2015-11-16 DIAGNOSIS — D509 Iron deficiency anemia, unspecified: Secondary | ICD-10-CM | POA: Diagnosis not present

## 2015-11-16 LAB — CBC WITH DIFFERENTIAL/PLATELET
BASO%: 0.8 % (ref 0.0–2.0)
Basophils Absolute: 0 10*3/uL (ref 0.0–0.1)
EOS%: 4.7 % (ref 0.0–7.0)
Eosinophils Absolute: 0.3 10*3/uL (ref 0.0–0.5)
HCT: 47.2 % (ref 38.4–49.9)
HGB: 15.6 g/dL (ref 13.0–17.1)
LYMPH%: 18.6 % (ref 14.0–49.0)
MCH: 30.3 pg (ref 27.2–33.4)
MCHC: 33 g/dL (ref 32.0–36.0)
MCV: 92 fL (ref 79.3–98.0)
MONO#: 0.8 10*3/uL (ref 0.1–0.9)
MONO%: 13 % (ref 0.0–14.0)
NEUT#: 3.7 10*3/uL (ref 1.5–6.5)
NEUT%: 62.9 % (ref 39.0–75.0)
Platelets: 165 10*3/uL (ref 140–400)
RBC: 5.13 10*6/uL (ref 4.20–5.82)
RDW: 14.2 % (ref 11.0–14.6)
WBC: 5.8 10*3/uL (ref 4.0–10.3)
lymph#: 1.1 10*3/uL (ref 0.9–3.3)

## 2015-11-16 NOTE — Telephone Encounter (Signed)
Gave and pritnd appt sched and avs fo rpt for April 2017

## 2015-11-16 NOTE — Progress Notes (Signed)
Hematology and Oncology Follow Up Visit  [name] [id number] 17-Jan-1946 69 y.o. 11/16/2015 3:38 PM   Principle Diagnosis: 69 year old with iron deficiency anemia diagnosed in 06/2012. This is likely due to GI  loss from AVMs.  Prior Therapy: IV iron infusion intermittently. S/P Feraheme in 06/2012 and most recent of which in March 2016.  Interim History:  [name] presents today for a follow up visit with his wife. Since his last visit, he reports no major complaints. He received IV iron back in March 2016 and showed significant improvement in his symptoms. His fatigue and energy level all dramatically improved.  He does have congestive heart failure and does have intermittent hospitalizations regarding that but none recently. His shortness of breath is at baseline level without any major changes since the last evaluation. Does not report any decline in his performance status activity level. He has not reported any orthopnea or leg edema.  No active bleeding noted. He does report some occasional hematochezia but no overt bleeding at this time.    He does not report any headaches or blurry does not report any seizure or syncope. Does not report any fevers or chills or sweats. Does not report any chest pain or palpitation. Does not report any cough or wheezing. Does not report any nausea, vomiting or abdominal pain. He does not report any lymphadenopathy or petechiae. Does not report any genitourinary complaints. His review of systems unremarkable.    Medications: I have reviewed the patient's current medications.  Current Outpatient Prescriptions  Medication Sig Dispense Refill  . albuterol (PROVENTIL HFA;VENTOLIN HFA) 108 (90 BASE) MCG/ACT inhaler Inhale 2 puffs into the lungs every 6 (six) hours as needed for wheezing or shortness of breath. 3 Inhaler 4  . albuterol (PROVENTIL) (2.5 MG/3ML) 0.083% nebulizer solution Take 3 mLs (2.5 mg total) by nebulization every 6 (six) hours. 360 mL 6   . amiodarone (PACERONE) 200 MG tablet Take 1 tablet by mouth  daily 90 tablet 3  . aspirin 81 MG tablet Take 81 mg by mouth daily.    Marland Kitchen atorvastatin (LIPITOR) 40 MG tablet Take 1 tablet by mouth  daily 90 tablet 3  . carvedilol (COREG) 25 MG tablet Take 1 tablet by mouth  twice a day with meals 180 tablet 3  . clopidogrel (PLAVIX) 75 MG tablet Take 1 tablet by mouth  daily 90 tablet 3  . ENTRESTO 24-26 MG Take 1 tablet by mouth two  times daily 60 tablet 3  . finasteride (PROSCAR) 5 MG tablet Take 5 mg by mouth daily.    . fluticasone (FLONASE) 50 MCG/ACT nasal spray Instill 1 spray in each  nostril daily 16 g 3  . furosemide (LASIX) 80 MG tablet Take 1 tablet by mouth two  times daily 180 tablet 3  . isosorbide mononitrate (IMDUR) 60 MG 24 hr tablet Take 1 tablet by mouth  daily 90 tablet 3  . levothyroxine (SYNTHROID, LEVOTHROID) 50 MCG tablet Take 1 tablet by mouth  daily before breakfast 90 tablet 1  . Multiple Vitamins-Minerals (MENS 50+ MULTI VITAMIN/MIN) TABS Take 1 tablet by mouth daily.     . multivitamin-lutein (OCUVITE-LUTEIN) CAPS Take 1 capsule by mouth daily.      Marland Kitchen NITROSTAT 0.4 MG SL tablet Dissolve one tablet under  the tongue every 5 minutes  as needed for chest pain  (up to 3 tabs in 15 minutes then call 911) 25 tablet 6  . NON FORMULARY at bedtime. CPAP And  during the day as needed    . spironolactone (ALDACTONE) 25 MG tablet Take 1 tablet by mouth  daily 90 tablet 3   No current facility-administered medications for this visit.     Allergies:  Allergies  Allergen Reactions  . Tussionex Pennkinetic Er [Hydrocod Polst-Cpm Polst Er] Other (See Comments)    "caused prostate problems"  . Ace Inhibitors Other (See Comments)    REACTION: cough  . Penicillins Other (See Comments)    "childhood allergy"    Past Medical History, Surgical history, Social history, and Family History were reviewed and updated.     Physical Exam: Blood pressure 142/62, pulse 70,  temperature 97.7 F (36.5 C), temperature source Oral, resp. rate 18, height '5\' 8"'$  (1.727 m), weight 261 lb (118.389 kg), SpO2 95 %. ECOG: 1 General appearance: alert awake pleasant man without distress. Head: Normocephalic, without obvious abnormality no oral ulcers or lesions. Neck: no JVD. No lymphadenopathy noted. Lymph nodes: Cervical, supraclavicular, and axillary nodes normal. Heart:regular rate and rhythm, S1, S2 normal, no murmur, click, rub or gallop Lung:chest clear, no wheezing, rales, normal symmetric air entry Abdomen: soft, non-tender, without masses or organomegaly no shifting dullness or ascites. EXT:no erythema, induration, or nodules   Lab Results: Lab Results  Component Value Date   WBC 5.8 11/16/2015   HGB 15.6 11/16/2015   HCT 47.2 11/16/2015   MCV 92.0 11/16/2015   PLT 165 11/16/2015     Chemistry      Component Value Date/Time   NA 140 11/12/2015 1215   NA 145 02/16/2015 1427   K 4.3 11/12/2015 1215   K 4.2 02/16/2015 1427   CL 105 11/12/2015 1215   CL 94* 10/02/2012 0937   CO2 28 11/12/2015 1215   CO2 27 02/16/2015 1427   BUN 18 11/12/2015 1215   BUN 18.5 02/16/2015 1427   CREATININE 1.00 11/12/2015 1215   CREATININE 1.0 02/16/2015 1427   CREATININE 0.75 10/31/2013 1216      Component Value Date/Time   CALCIUM 9.2 11/12/2015 1215   CALCIUM 9.5 02/16/2015 1427   ALKPHOS 70 10/13/2015 1554   ALKPHOS 108 02/16/2015 1427   AST 27 10/13/2015 1554   AST 17 02/16/2015 1427   ALT 27 10/13/2015 1554   ALT 17 02/16/2015 1427   BILITOT 1.1 10/13/2015 1554   BILITOT 0.42 02/16/2015 1427      Results for JASSEN, SARVER (MRN [id number]) as of 11/16/2015 15:24  Ref. Range 02/16/2015 14:26  Iron Latest Ref Range: 42-163 ug/dL 28 (L)  UIBC Latest Ref Range: 117-376 ug/dL 370  TIBC Latest Ref Range: 202-409 ug/dL 398  %SAT Latest Ref Range: 20-55 % 7 (L)  Ferritin Latest Ref Range: 22-316 ng/ml 13 (L)    Impression and Plan:  A 69 year old  gentleman with the following issues:  1. Microcytic hypochromic anemia with a documented iron deficiency anemia  in May of 2013. He is status post IV iron on several occasions the last of which was in March 2016. He feels well at this time and his hemoglobin have normalized. The plan is to continue to monitor his iron studies and replace with IV iron as needed. The etiology of his iron deficiency is unclear but no GI blood losses noted. He has difficulty with absorbing iron through his GI tract and will intermittently need IV iron.  2. GI blood loss: No active losses noted at this time.  3. Follow up: in 4 months.    CWCBJS,EGBTD 12/6/20163:38 PM

## 2015-11-17 DIAGNOSIS — Z23 Encounter for immunization: Secondary | ICD-10-CM | POA: Diagnosis not present

## 2015-11-17 LAB — IRON AND TIBC
%SAT: 22 % (ref 20–55)
Iron: 65 ug/dL (ref 42–163)
TIBC: 289 ug/dL (ref 202–409)
UIBC: 224 ug/dL (ref 117–376)

## 2015-11-17 LAB — FERRITIN: Ferritin: 58 ng/ml (ref 22–316)

## 2015-11-18 ENCOUNTER — Encounter: Payer: Self-pay | Admitting: Family

## 2015-11-18 ENCOUNTER — Ambulatory Visit (INDEPENDENT_AMBULATORY_CARE_PROVIDER_SITE_OTHER): Payer: Medicare Other | Admitting: Family

## 2015-11-18 VITALS — BP 150/82 | HR 68 | Temp 98.9°F | Resp 22 | Ht 68.0 in | Wt 265.0 lb

## 2015-11-18 DIAGNOSIS — J441 Chronic obstructive pulmonary disease with (acute) exacerbation: Secondary | ICD-10-CM

## 2015-11-18 DIAGNOSIS — I255 Ischemic cardiomyopathy: Secondary | ICD-10-CM

## 2015-11-18 MED ORDER — UMECLIDINIUM-VILANTEROL 62.5-25 MCG/INH IN AEPB: 1.0000 | INHALATION_SPRAY | Freq: Every day | RESPIRATORY_TRACT | Status: DC | PRN

## 2015-11-18 MED ORDER — PREDNISONE 20 MG PO TABS: 20.0000 mg | ORAL_TABLET | Freq: Two times a day (BID) | ORAL | Status: DC

## 2015-11-18 MED ORDER — LEVOFLOXACIN 500 MG PO TABS: 500.0000 mg | ORAL_TABLET | Freq: Every day | ORAL | Status: DC

## 2015-11-18 NOTE — Progress Notes (Signed)
Pre visit review using our clinic review tool, if applicable. No additional management support is needed unless otherwise documented below in the visit note. 

## 2015-11-18 NOTE — Progress Notes (Signed)
Subjective:    Patient ID: [name], male    DOB: 10-May-1946, 69 y.o.   MRN: [id number]  Chief Complaint  Patient presents with  . Cough    SOB, has COPD and heart failure     HPI:  [name] is a 69 y.o. male who  has a past medical history of Myocardial infarction ([place]); Hypertension; CHF (congestive heart failure) (HCC); COPD (chronic obstructive pulmonary disease) (HCC); GERD (gastroesophageal reflux disease); AAA (abdominal aortic aneurysm) ([place]); Tremor; Dysrhythmia; Carotid artery stenosis; Peripheral vascular disease ([place]); Adenomatous colon polyp (01/2004); Diverticulosis; AVM (arteriovenous malformation); CAD (coronary artery disease); HLD (hyperlipidemia); Hypothyroidism; BPH (benign prostatic hypertrophy); Fatigue; Automatic implantable cardioverter-defibrillator in situ; OSA on CPAP; H/O hiatal hernia; Anemia; Complication of anesthesia; Ischemic cardiomyopathy; Ventricular tachycardia ([place]) (09/09/2014); and Diabetes mellitus. and presents today for an acute office visit.     Associated symptom of shortness of breath, increased sputum production , mild sinus pressure, and productive cough have been going on for several days. Denies fevers. Modifying factors include salt-water rinises of his sinuses and garggling with high alcohol content which has not helped very much. Denies any recent antibiotic use.   Allergies  Allergen Reactions  . Tussionex Pennkinetic Er [Hydrocod Polst-Cpm Polst Er] Other (See Comments)    "caused prostate problems"  . Ace Inhibitors Other (See Comments)    REACTION: cough  . Penicillins Other (See Comments)    "childhood allergy"     Current Outpatient Prescriptions on File Prior to Visit  Medication Sig Dispense Refill  . albuterol (PROVENTIL HFA;VENTOLIN HFA) 108 (90 BASE) MCG/ACT inhaler Inhale 2 puffs into the lungs every 6 (six) hours as needed for wheezing or shortness of breath. 3 Inhaler 4  . albuterol (PROVENTIL) (2.5  MG/3ML) 0.083% nebulizer solution Take 3 mLs (2.5 mg total) by nebulization every 6 (six) hours. 360 mL 6  . amiodarone (PACERONE) 200 MG tablet Take 1 tablet by mouth  daily 90 tablet 3  . aspirin 81 MG tablet Take 81 mg by mouth daily.    Marland Kitchen atorvastatin (LIPITOR) 40 MG tablet Take 1 tablet by mouth  daily 90 tablet 3  . carvedilol (COREG) 25 MG tablet Take 1 tablet by mouth  twice a day with meals 180 tablet 3  . clopidogrel (PLAVIX) 75 MG tablet Take 1 tablet by mouth  daily 90 tablet 3  . ENTRESTO 24-26 MG Take 1 tablet by mouth two  times daily 60 tablet 3  . finasteride (PROSCAR) 5 MG tablet Take 5 mg by mouth daily.    . fluticasone (FLONASE) 50 MCG/ACT nasal spray Instill 1 spray in each  nostril daily 16 g 3  . furosemide (LASIX) 80 MG tablet Take 1 tablet by mouth two  times daily 180 tablet 3  . isosorbide mononitrate (IMDUR) 60 MG 24 hr tablet Take 1 tablet by mouth  daily 90 tablet 3  . levothyroxine (SYNTHROID, LEVOTHROID) 50 MCG tablet Take 1 tablet by mouth  daily before breakfast 90 tablet 1  . Multiple Vitamins-Minerals (MENS 50+ MULTI VITAMIN/MIN) TABS Take 1 tablet by mouth daily.     . multivitamin-lutein (OCUVITE-LUTEIN) CAPS Take 1 capsule by mouth daily.      Marland Kitchen NITROSTAT 0.4 MG SL tablet Dissolve one tablet under  the tongue every 5 minutes  as needed for chest pain  (up to 3 tabs in 15 minutes then call 911) 25 tablet 6  . NON FORMULARY at bedtime. CPAP And during the day  as needed    . spironolactone (ALDACTONE) 25 MG tablet Take 1 tablet by mouth  daily 90 tablet 3   No current facility-administered medications on file prior to visit.      Review of Systems  Constitutional: Negative for fever and chills.  HENT: Positive for congestion. Negative for sinus pressure and sore throat.   Respiratory: Positive for cough, shortness of breath and wheezing.   Neurological: Negative for headaches.      Objective:    BP 150/82 mmHg  Pulse 68  Temp(Src) 98.9 F (37.2  C) (Oral)  Resp 22  Ht '5\' 8"'$  (1.727 m)  Wt 265 lb (120.203 kg)  BMI 40.30 kg/m2  SpO2 94% Nursing note and vital signs reviewed.  Physical Exam  Constitutional: He is oriented to person, place, and time. He appears well-developed and well-nourished. No distress.  HENT:  Right Ear: Hearing, tympanic membrane, external ear and ear canal normal.  Left Ear: Hearing, tympanic membrane, external ear and ear canal normal.  Nose: Nose normal.  Mouth/Throat: Uvula is midline, oropharynx is clear and moist and mucous membranes are normal.  Cardiovascular: Normal rate, regular rhythm, normal heart sounds and intact distal pulses.   Pulmonary/Chest: Effort normal. He has wheezes.  Neurological: He is alert and oriented to person, place, and time.  Skin: Skin is warm and dry.  Psychiatric: He has a normal mood and affect. His behavior is normal. Judgment and thought content normal.       Assessment & Plan:   Problem List Items Addressed This Visit      Respiratory   COPD exacerbation ([place]) - Primary    Symptoms and exam consistent with COPD exacerbation. Start levofloxacin. Start prednisone. Sample of an oral provided. Continue over-the-counter medications as needed for symptom relief and supportive care. Follow-up if symptoms worsen or fail to improve.      Relevant Medications   levofloxacin (LEVAQUIN) 500 MG tablet   predniSONE (DELTASONE) 20 MG tablet   Umeclidinium-Vilanterol (ANORO ELLIPTA) 62.5-25 MCG/INH AEPB

## 2015-11-18 NOTE — Assessment & Plan Note (Signed)
Symptoms and exam consistent with COPD exacerbation. Start levofloxacin. Start prednisone. Sample of an oral provided. Continue over-the-counter medications as needed for symptom relief and supportive care. Follow-up if symptoms worsen or fail to improve.

## 2015-11-18 NOTE — Patient Instructions (Signed)
Thank you for choosing [facility].  Summary/Instructions:  Your prescription(s) have been submitted to your pharmacy or been printed and provided for you. Please take as directed and contact our office if you believe you are having problem(s) with the medication(s) or have any questions.  If your symptoms worsen or fail to improve, please contact our office for further instruction, or in case of emergency go directly to the emergency room at the closest medical facility.     

## 2015-11-30 ENCOUNTER — Encounter: Payer: Self-pay | Admitting: Internal Medicine

## 2015-11-30 ENCOUNTER — Ambulatory Visit (INDEPENDENT_AMBULATORY_CARE_PROVIDER_SITE_OTHER)
Admission: RE | Admit: 2015-11-30 | Discharge: 2015-11-30 | Disposition: A | Discharge status: Home / Self Care | Payer: Medicare Other | Source: Ambulatory Visit | Attending: Internal Medicine | Admitting: Internal Medicine

## 2015-11-30 ENCOUNTER — Other Ambulatory Visit (HOSPITAL_COMMUNITY): Payer: Self-pay | Admitting: Pharmacist

## 2015-11-30 ENCOUNTER — Ambulatory Visit (INDEPENDENT_AMBULATORY_CARE_PROVIDER_SITE_OTHER): Payer: Medicare Other | Admitting: Internal Medicine

## 2015-11-30 VITALS — BP 118/70 | HR 66 | Temp 97.9°F | Resp 20 | Ht 68.0 in

## 2015-11-30 DIAGNOSIS — J449 Chronic obstructive pulmonary disease, unspecified: Secondary | ICD-10-CM | POA: Diagnosis not present

## 2015-11-30 DIAGNOSIS — I255 Ischemic cardiomyopathy: Secondary | ICD-10-CM

## 2015-11-30 DIAGNOSIS — R059 Cough, unspecified: Secondary | ICD-10-CM

## 2015-11-30 DIAGNOSIS — R911 Solitary pulmonary nodule: Secondary | ICD-10-CM

## 2015-11-30 DIAGNOSIS — R05 Cough: Secondary | ICD-10-CM | POA: Diagnosis not present

## 2015-11-30 DIAGNOSIS — J441 Chronic obstructive pulmonary disease with (acute) exacerbation: Secondary | ICD-10-CM

## 2015-11-30 MED ORDER — IPRATROPIUM-ALBUTEROL 0.5-2.5 (3) MG/3ML IN SOLN: 3.0000 mL | Freq: Once | RESPIRATORY_TRACT | Status: DC

## 2015-11-30 MED ORDER — PROMETHAZINE-DM 6.25-15 MG/5ML PO SYRP: 5.0000 mL | ORAL_SOLUTION | Freq: Four times a day (QID) | ORAL | Status: DC | PRN

## 2015-11-30 MED ORDER — IPRATROPIUM BROMIDE HFA 17 MCG/ACT IN AERS: 2.0000 | INHALATION_SPRAY | RESPIRATORY_TRACT | Status: DC | PRN

## 2015-11-30 MED ORDER — SACUBITRIL-VALSARTAN 24-26 MG PO TABS: 1.0000 | ORAL_TABLET | Freq: Two times a day (BID) | ORAL | Status: DC

## 2015-11-30 NOTE — Progress Notes (Signed)
Pre visit review using our clinic review tool, if applicable. No additional management support is needed unless otherwise documented below in the visit note. 

## 2015-11-30 NOTE — Patient Instructions (Signed)

## 2015-11-30 NOTE — Telephone Encounter (Signed)
Patient enrolled in [employer] for help with coverage of Entresto copays. He can use up to $1500 through 11/28/2016. Pharmacy and patient notified.   Billing ID: [id number] Person Code: [id number] RX Group: [id number] RX BIN: [id number] PCN for Part D: [id number]  Ruta Hinds. Velva Harman, PharmD, BCPS, CPP Clinical Pharmacist Pager: [phone] Phone: [phone] 11/30/2015 4:48 PM

## 2015-11-30 NOTE — Progress Notes (Signed)
Subjective:  Patient ID: [name], male    DOB: July 04, 1946  Age: 69 y.o. MRN: [id number]  CC: Cough   HPI ALIC HILBURN presents for a persistent cough despite 2 rounds of antibiotics and a course of prednisone. He complains of a cough that is productive of clear, foamy, and fibrous phlegm. He is coughing so severely that he has diffuse rib cage pain. He also complains of wheezing, shortness of breath and dyspnea on exertion. When he was last seen he was prescribed Anoro but he tells me he is not using it. He has been using an albuterol inhaler and nebulizer and that has provided some relief.  Outpatient Prescriptions Prior to Visit  Medication Sig Dispense Refill  . albuterol (PROVENTIL HFA;VENTOLIN HFA) 108 (90 BASE) MCG/ACT inhaler Inhale 2 puffs into the lungs every 6 (six) hours as needed for wheezing or shortness of breath. 3 Inhaler 4  . albuterol (PROVENTIL) (2.5 MG/3ML) 0.083% nebulizer solution Take 3 mLs (2.5 mg total) by nebulization every 6 (six) hours. 360 mL 6  . amiodarone (PACERONE) 200 MG tablet Take 1 tablet by mouth  daily 90 tablet 3  . aspirin 81 MG tablet Take 81 mg by mouth daily.    Marland Kitchen atorvastatin (LIPITOR) 40 MG tablet Take 1 tablet by mouth  daily 90 tablet 3  . carvedilol (COREG) 25 MG tablet Take 1 tablet by mouth  twice a day with meals 180 tablet 3  . clopidogrel (PLAVIX) 75 MG tablet Take 1 tablet by mouth  daily 90 tablet 3  . finasteride (PROSCAR) 5 MG tablet Take 5 mg by mouth daily.    . fluticasone (FLONASE) 50 MCG/ACT nasal spray Instill 1 spray in each  nostril daily 16 g 3  . furosemide (LASIX) 80 MG tablet Take 1 tablet by mouth two  times daily 180 tablet 3  . isosorbide mononitrate (IMDUR) 60 MG 24 hr tablet Take 1 tablet by mouth  daily 90 tablet 3  . levothyroxine (SYNTHROID, LEVOTHROID) 50 MCG tablet Take 1 tablet by mouth  daily before breakfast 90 tablet 1  . Multiple Vitamins-Minerals (MENS 50+ MULTI VITAMIN/MIN) TABS Take 1 tablet by  mouth daily.     . multivitamin-lutein (OCUVITE-LUTEIN) CAPS Take 1 capsule by mouth daily.      Marland Kitchen NITROSTAT 0.4 MG SL tablet Dissolve one tablet under  the tongue every 5 minutes  as needed for chest pain  (up to 3 tabs in 15 minutes then call 911) 25 tablet 6  . NON FORMULARY at bedtime. CPAP And during the day as needed    . spironolactone (ALDACTONE) 25 MG tablet Take 1 tablet by mouth  daily 90 tablet 3  . ENTRESTO 24-26 MG Take 1 tablet by mouth two  times daily 60 tablet 3  . Umeclidinium-Vilanterol (ANORO ELLIPTA) 62.5-25 MCG/INH AEPB Inhale 1 puff into the lungs daily as needed. 14 each 0  . levofloxacin (LEVAQUIN) 500 MG tablet Take 1 tablet (500 mg total) by mouth daily. 7 tablet 0  . predniSONE (DELTASONE) 20 MG tablet Take 1 tablet (20 mg total) by mouth 2 (two) times daily with a meal. 20 tablet 0   No facility-administered medications prior to visit.    ROS Review of Systems  Constitutional: Negative.  Negative for fever, chills, diaphoresis, appetite change and fatigue.  HENT: Negative.  Negative for facial swelling, trouble swallowing and voice change.   Eyes: Negative.   Respiratory: Positive for cough, shortness of breath and  wheezing. Negative for apnea, choking, chest tightness and stridor.   Cardiovascular: Negative.  Negative for chest pain, palpitations and leg swelling.  Gastrointestinal: Positive for constipation. Negative for nausea, vomiting, abdominal pain and diarrhea.  Endocrine: Negative.   Genitourinary: Negative.   Musculoskeletal: Negative.   Skin: Negative.  Negative for color change and rash.  Allergic/Immunologic: Negative.   Neurological: Negative.   Hematological: Negative.  Negative for adenopathy. Does not bruise/bleed easily.  Psychiatric/Behavioral: Negative.     Objective:  BP 118/70 mmHg  Pulse 66  Temp(Src) 97.9 F (36.6 C) (Oral)  Resp 20  Ht '5\' 8"'$  (1.727 m)  SpO2 96%  BP Readings from Last 3 Encounters:  11/30/15 118/70    11/18/15 150/82  11/16/15 142/62    Wt Readings from Last 3 Encounters:  11/18/15 265 lb (120.203 kg)  11/16/15 261 lb (118.389 kg)  11/12/15 258 lb (117.028 kg)    Physical Exam  Constitutional: He is oriented to person, place, and time. He appears well-developed and well-nourished.  Non-toxic appearance. He does not have a sickly appearance. He does not appear ill. No distress.  HENT:  Mouth/Throat: Oropharynx is clear and moist. No oropharyngeal exudate.  Eyes: Conjunctivae are normal. Right eye exhibits no discharge. Left eye exhibits no discharge. No scleral icterus.  Neck: Normal range of motion. Neck supple. No JVD present. No tracheal deviation present. No thyromegaly present.  Cardiovascular: Normal rate, regular rhythm, normal heart sounds and intact distal pulses.  Exam reveals no gallop and no friction rub.   No murmur heard. Pulmonary/Chest: Effort normal. No accessory muscle usage or stridor. No tachypnea. No respiratory distress. He has no decreased breath sounds. He has wheezes in the right middle field and the left middle field. He has rhonchi in the right middle field, the right lower field, the left middle field and the left lower field. He has no rales. He exhibits no tenderness.  He received a nebulized treatment with albuterol and ipratropium and after that his lung examination shows marked improvement in the wheezing and rhonchi, he has good air movement.  Abdominal: Soft. Bowel sounds are normal. He exhibits no distension and no mass. There is no tenderness. There is no rebound and no guarding.  Musculoskeletal: Normal range of motion. He exhibits no edema or tenderness.  Lymphadenopathy:    He has no cervical adenopathy.  Neurological: He is oriented to person, place, and time.  Skin: Skin is warm and dry. No rash noted. He is not diaphoretic. No erythema. No pallor.  Vitals reviewed.   Lab Results  Component Value Date   WBC 5.8 11/16/2015   HGB 15.6  11/16/2015   HCT 47.2 11/16/2015   PLT 165 11/16/2015   GLUCOSE 111* 11/12/2015   CHOL 148 10/13/2015   TRIG 162* 10/13/2015   HDL 33* 10/13/2015   LDLCALC 83 10/13/2015   ALT 27 10/13/2015   AST 27 10/13/2015   NA 140 11/12/2015   K 4.3 11/12/2015   CL 105 11/12/2015   CREATININE 1.00 11/12/2015   BUN 18 11/12/2015   CO2 28 11/12/2015   TSH 3.840 10/13/2015   PSA 0.51 04/18/2012   INR 1.0 10/05/2014   HGBA1C 6.3 02/17/2015   Dg Chest 2 View  11/30/2015  CLINICAL DATA:  Cough and congestion. EXAM: CHEST  2 VIEW COMPARISON:  CT 10/19/2015 . FINDINGS: AICD noted good anatomic position. Prior CABG. Cardiomegaly with pulmonary vascular prominence and interstitial prominence consistent with congestive heart failure. No pleural effusion. No  acute bony abnormality IMPRESSION: 1. AICD in good anatomic position. 2. Findings suggesting mild congestive heart failure with mild pulmonary interstitial edema . Electronically Signed   By: Marcello Moores  Register   On: 11/30/2015 10:40   No results found.  Assessment & Plan:   Grabiel was seen today for cough.  Diagnoses and all orders for this visit:  Nodule of right lung- no nodule noted on today's x-ray -     DG Chest 2 View; Future  Cough- her chest x-ray is positive for mild congestive heart failure which is a known entity for him, there is no mass, no pneumonia or nodule on today's x-ray, he will take Phenergan DM as needed for the cough -     DG Chest 2 View; Future -     promethazine-dextromethorphan (PROMETHAZINE-DM) 6.25-15 MG/5ML syrup; Take 5 mLs by mouth 4 (four) times daily as needed for cough. -     ipratropium-albuterol (DUONEB) 0.5-2.5 (3) MG/3ML nebulizer solution 3 mL; Take 3 mLs by nebulization once.  COPD exacerbation ([place])- I think he would benefit from adding an anti-cholinergic agent so I have asked him to start using Atrovent inhaler 2 puffs every 4 hours throughout the day. -     ipratropium (ATROVENT HFA) 17 MCG/ACT  inhaler; Inhale 2 puffs into the lungs every 4 (four) hours as needed for wheezing. -     ipratropium-albuterol (DUONEB) 0.5-2.5 (3) MG/3ML nebulizer solution 3 mL; Take 3 mLs by nebulization once.  COPD mixed type ([place])- we'll start Atrovent inhaler -     ipratropium (ATROVENT HFA) 17 MCG/ACT inhaler; Inhale 2 puffs into the lungs every 4 (four) hours as needed for wheezing.   I have discontinued Mr. Gum's levofloxacin and Umeclidinium-Vilanterol. I am also having him start on promethazine-dextromethorphan and ipratropium. Additionally, I am having him maintain his multivitamin-lutein, aspirin, finasteride, MENS 50+ MULTI VITAMIN/MIN, NON FORMULARY, albuterol, NITROSTAT, spironolactone, levothyroxine, albuterol, furosemide, clopidogrel, fluticasone, amiodarone, carvedilol, atorvastatin, isosorbide mononitrate, and predniSONE. We will continue to administer ipratropium-albuterol.  Meds ordered this encounter  Medications  . predniSONE (DELTASONE) 20 MG tablet    Sig: TAKE 1 TABLET BY MOUTH TWICE A DAY WITH A MEAL    Refill:  0  . promethazine-dextromethorphan (PROMETHAZINE-DM) 6.25-15 MG/5ML syrup    Sig: Take 5 mLs by mouth 4 (four) times daily as needed for cough.    Dispense:  118 mL    Refill:  0  . ipratropium (ATROVENT HFA) 17 MCG/ACT inhaler    Sig: Inhale 2 puffs into the lungs every 4 (four) hours as needed for wheezing.    Dispense:  1 Inhaler    Refill:  12  . ipratropium-albuterol (DUONEB) 0.5-2.5 (3) MG/3ML nebulizer solution 3 mL    Sig:      Follow-up: Return in about 3 weeks (around 12/21/2015).  [name], MD

## 2015-12-28 ENCOUNTER — Ambulatory Visit (INDEPENDENT_AMBULATORY_CARE_PROVIDER_SITE_OTHER): Payer: Medicare Other | Admitting: Ophthalmology

## 2016-01-11 ENCOUNTER — Ambulatory Visit (INDEPENDENT_AMBULATORY_CARE_PROVIDER_SITE_OTHER): Payer: Medicare Other | Admitting: Internal Medicine

## 2016-01-11 ENCOUNTER — Encounter: Payer: Self-pay | Admitting: Internal Medicine

## 2016-01-11 ENCOUNTER — Other Ambulatory Visit (INDEPENDENT_AMBULATORY_CARE_PROVIDER_SITE_OTHER): Payer: Medicare Other

## 2016-01-11 VITALS — BP 124/72 | HR 70 | Ht 68.0 in | Wt 260.0 lb

## 2016-01-11 DIAGNOSIS — I509 Heart failure, unspecified: Secondary | ICD-10-CM

## 2016-01-11 DIAGNOSIS — Z880 Allergy status to penicillin: Secondary | ICD-10-CM

## 2016-01-11 DIAGNOSIS — I5042 Chronic combined systolic (congestive) and diastolic (congestive) heart failure: Secondary | ICD-10-CM

## 2016-01-11 DIAGNOSIS — J441 Chronic obstructive pulmonary disease with (acute) exacerbation: Secondary | ICD-10-CM

## 2016-01-11 DIAGNOSIS — J42 Unspecified chronic bronchitis: Secondary | ICD-10-CM | POA: Diagnosis not present

## 2016-01-11 LAB — BASIC METABOLIC PANEL
BUN: 17 mg/dL (ref 6–23)
CO2: 31 mEq/L (ref 19–32)
Calcium: 9.5 mg/dL (ref 8.4–10.5)
Chloride: 106 mEq/L (ref 96–112)
Creatinine, Ser: 0.86 mg/dL (ref 0.40–1.50)
GFR: 93.17 mL/min (ref >60.00)
Glucose, Bld: 118 mg/dL — ABNORMAL HIGH (ref 70–99)
Potassium: 4.4 mEq/L (ref 3.5–5.1)
Sodium: 144 mEq/L (ref 135–145)

## 2016-01-11 LAB — BRAIN NATRIURETIC PEPTIDE: Pro B Natriuretic peptide (BNP): 154 pg/mL — ABNORMAL HIGH (ref 0.0–100.0)

## 2016-01-11 MED ORDER — AZITHROMYCIN 250 MG PO TABS
ORAL_TABLET | ORAL | Status: DC
Start: 2016-01-11 — End: 2016-02-02

## 2016-01-11 NOTE — Progress Notes (Signed)
06/03/12- 74 yoM former smoker seen in pulmonary consultation at the request of Dr. Ronnald Ramp. Per Dr. Ronnald Ramp. Patient c/o sob and difficultly breathing when sitting while talking and walking.  Denies chest pain, chest tightness, wheeizng, and cough. Diagnosed with COPD 10 years ago. Wife is here. COPD Assessment Test (CAT) scored 24/40. He had smoked a pack a day half per day until 2005. 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 He was then diagnosed with hypothyroidism. Since then he has not exercised regularly, has felt weak with easier dyspnea on exertion. He denies cough or wheeze. Little change from day to day. His wife says he gets short of breath climbing 10 steps and he has to stop at the top. He has gained weight since thyroid diagnosis and with lack of exercise. He indicates 40 pound weight gain in the past 2 years. Coronary artery disease with history of multiple myocardial infarctions, congestive heart failure, hypertension, implanted AICD in left chest. No history of asthma. Ankles swell. Diagnosed obstructive sleep apnea/Dr. Kelly/CPAP/Advanced, unknown pressure. Anemia 4 years ago required transfusion. Uses Spiriva daily, occasional rescue Combivent./ MI/ AICD, OSA(Dr Kelly)  07/16/12- 66 yoM former smoker followed for dyspnea/ COPD, complicated by hx OSA/ Dr Claiborne Billings, CAD/ CHF/ MI/ AICD Wife is here today Found to be anemic and now treated with iron. Very short of breath over the weekend/blames weather, limiting dyspnea on exertion then but today feels "great". Using Spiriva with rare need for rescue albuterol. Says he is getting bronchitis less often than in the past. COPD assessment test (CAT) score 27/40 Complains of nasal stuffiness supine. Failed nasal strips. Sleeps sitting up because of this and uses saline nasal rinse. 6 minute walk test 07/16/2012: 95%, 97%, 98%, 216 m-ended test labored  for breath and fatigue but without desaturation. CXR 06/10/12 reviewed with them-bronchitis changes, CE/ASVD/CABG IMPRESSION:  1. Diffuse peribronchial cuffing. Given the patient's history of  prior smoking, this may suggest chronic bronchitis.  2. In addition, there is mild cardiomegaly with pulmonary venous  congestion, but no frank pulmonary edema at this time.  3. Status post median sternotomy for CABG.  4. Atherosclerosis.  Original Report Authenticated By: Etheleen Mayhew, M.D.  PFT: 07/16/2012-moderate to severe obstructive airways disease with air trapping, diffusion mildly reduced. FEV1/FVC 0.49. Insignificant response to bronchodilator. RV 128%, DLCO 69%.   12/20/12-  40 yoM former smoker followed for dyspnea/ COPD, complicated by hx OSA/ Dr Claiborne Billings, CAD/ CHF/ MI/ AICD     Wife here FOLLOWS FOR: breathing is unchanged since last OV. denies chest pain, chest tightness or coughing. reports increased SOB with extertion. Probably no change in dyspnea with exertion when he thinks in terms of the impact on ADLs. He says he joined weight watchers last night and plans to lose 100 pounds. His cardiologist has increased his diuretic. No routine cough, phlegm or wheeze. CXR 06/10/12 IMPRESSION:  1. Diffuse peribronchial cuffing. Given the patient's history of  prior smoking, this may suggest chronic bronchitis.  2. In addition, there is mild cardiomegaly with pulmonary venous  congestion, but no frank pulmonary edema at this time.  3. Status post median sternotomy for CABG.  4. Atherosclerosis.  Original Report Authenticated By: Etheleen Mayhew, M.D.  03/11/14- 21 yoM former smoker followed for dyspnea/ COPD, complicated by hx OSA/ Dr Claiborne Billings, CAD/ CHF/ MI/ AICD     Wife here Presents with SOB with any  exertion, chest congestion, nonprod cough.  Cardiologist recommended this visit yesterday.  Wears cpap [facility] nightly, 12 hours/night as well as during day.  Using rescue inhaler and  Spiriva. Has not qualified for home oxygen. 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 In early March and rhonchi this with green sputum. Office spirometry 03/11/14- weak/submaximal effort. By the numbers-severe obstructive airways disease and restriction of exhaled volume. FVC 2.49/59%, FEV1 1.29/89%, FEV1/FEC 0.5 to, FEF 25-75% 0.82/27%. CXR 03/02/14 IMPRESSION:  Improved lung aeration with resolving edema and atelectasis. No  pleural effusion.  Electronically Signed  By: Kalman Jewels M.D.  On: 03/02/2014 09:59  05/12/14-  FOLLOWS FOR: Wears CPAP/ [facility]/managed by Dr Claiborne Billings, every night for about 8-10 hours; DME is [facility]. Machine 71 yrs old. Patient would like me to manage CPAP/ sleep. Breathing well with nebs. Easy DOE. No cough on lisinopril.  03/04/15- 23 yoM former smoker followed for dyspnea/ COPD, complicated by hx OSA/ Dr Claiborne Billings, CAD/ CHF/ MI/ AICD/CM/ AAA     Wife here  CPAP/ [facility]/managed here FOLLOWS FOR wear CPAP every night for 8-10 hours. DME is [facility]. C/O increased SOB especially with movement  Uncertain pressure. He and wife both report that he uses CPAP all night and anytime he naps. Machine is now quite old. He questions if another setting would be better. CT abdomen from 02/19/2015 showed incidental right upper lobe 1.3 cm groundglass density with follow-up chest CT scheduled for 03/19/2015. Home oxygen saturations running 96-99% even though he feels short of breath with exertion he uses nebulizer or metered inhaler 3 or 4 times daily, for breathlessness without much wheezing. Walk test 03/04/15- did not desaturate  07/05/15- 69 yoM former smoker followed for dyspnea/ COPD, complicated by hx OSA, CAD/ CHF/ MI/ AICD/CM/ AAA, lung nodule     Wife here FOLLOWS FOR: DME-[facility], cpap auto is working fine, wife states he loves it, also patient noticed tremendous improvement since starting  Entresto (sacubitril-  valsartan)  Questions if CPAP pressure could be a little higher.  Asks jury duty letter- too short of breath to climb steps. CT showed RUL nodule- Dr Hendrickson/ [facility] has scheduled f/u CT. CT chest 03/19/15 IMPRESSION: 1. Short interval stability of a solitary, partially solid and sub-solid right upper lung lesion. The solid component is greater than 5 mm. Still, persistence for 3 months has yet to be confirmed by CT. Recommend thoracic surgery consultation and/or repeat chest CT in 3 months. 2. Centrilobular emphysema. No mediastinal lymphadenopathy. Electronically Signed  By: Genevie Ann M.D.  On: 03/19/2015 10:57  01/11/2016-70 year old male former smoker followed for dyspnea/COPD, complicated by history OSA, CAD/CHF/MI/AICD/CM/AAA, lung nodule ACUTE Wife states pt does not have enough air to do anything even with O2 levels being normal; Severe SOB and fatigued-feels as though he is smothering-takes about 20 minutes to get through this.  Cough-productive-green for aobut 4-5 months and getting worse. Denies any calf pain. No fever, sore throat or obvious acute infection but sputum is been discolored and cough is annoying so he would like to try an antibiotic. Last visit to CHF clinic 1 month ago with pending cardiology appointment. Using nebulizer 3 or 4 times daily with transient benefit-helps him cough up phlegm. No blood, mostly white but with some yellow or green. Also using pro-air rescue inhaler 2 or 3 times daily. CT chest 10/19/2015 IMPRESSION: 1. The previously described subsolid nodule in the right upper lobe is very similar  to prior studies, measuring 2.3 x 0.8 cm on today's examination, with a tiny 2 mm central solid component. This lesion is nonspecific, and given the intimate association with some mildly bronchiectatic bronchi, is favored to represent an area of chronic post infectious or inflammatory scarring. However, repeat noncontrast chest CT is recommended in 12  months to ensure continued stability. This recommendation follows the consensus statement: Recommendations for the Management of Subsolid Pulmonary Nodules Detected at CT: A Statement from the Fleischner Society as published in Radiology 2013; 266:304-317. 2. Atherosclerosis, including left main and 3 vessel coronary artery disease. Status post median sternotomy for CABG. 3. Mild diffuse bronchial wall thickening with mild centrilobular and paraseptal emphysema ; imaging findings suggestive of underlying COPD. 4. Additional incidental findings, as above. CXR 11/30/2015 IMPRESSION: 1. AICD in good anatomic position. 2. Findings suggesting mild congestive heart failure with mild pulmonary interstitial edema .  ROS-see HPI Constitutional:   No-   weight loss, night sweats, fevers, chills, fatigue, lassitude. HEENT:   No-  headaches, difficulty swallowing, tooth/dental problems, sore throat,       No-  sneezing, itching, ear ache, nasal congestion, post nasal drip,  CV:  No-   chest pain, +orthopnea, PND, +swelling in lower extremities, No-anasarca, dizziness, palpitations Resp: + shortness of breath with exertion or at rest.              No-   productive cough,  No non-productive cough,  No- coughing up of blood.              No-   change in color of mucus.  No- wheezing.   Skin: No-   rash or lesions. GI:  No-   heartburn, indigestion, abdominal pain, nausea, vomiting, GU: . MS:  + joint pain or swelling.  Neuro-     nothing unusual Psych:  No- change in mood or affect. No depression or anxiety.  No memory loss.  OBJ- Physical Exam   94% at rest room air General- Alert, Oriented, Affect-appropriate, Distress- none acute, +obese, talkative Skin- clear Lymphadenopathy- none Head- atraumatic            Eyes- Gross vision intact, PERRLA, conjunctivae and secretions clear            Ears- Hearing, canals-normal            Nose- +turbinate edema, no-Septal dev, mucus, polyps, erosion,  perforation             Throat- Mallampati III, mucosa clear , drainage- none, tonsils- atrophic Neck- flexible , trachea midline, no stridor , thyroid nl, carotid no bruit Chest - symmetrical excursion , unlabored           Heart/CV- RRR , no murmur , no gallop  , no rub, nl s1 s2                           - JVD- none , edema trace+, stasis changes- none, varices- none           Lung- clear to P&A shallow inspiratory effort, rales- none, wheeze+, cough- none ,  dullness-none, rub- none,            Chest wall- sternotomy scar,  Pacemaker defibrillator L Abd-  Br/ Gen/ Rectal- Not done, not indicated Extrem- cyanosis- none, clubbing, none, atrophy- none, strength- nl, vein donor scars Neuro- head bob tremor

## 2016-01-11 NOTE — Patient Instructions (Addendum)
Order- lab- BNP, BMET, Penicillin IgE antibodies  Suggest you make appointment to see your heart doctor about CHF   Script sent for Zpak  Sample Anoro Ellipta inhaler    Inhale 1 puff, once daily    Try this until sample used up Don't use the Atrovent inhaler while you are using the Anoro Notice for our information, if the nebulizer ampules you have are ipratropium-albuterol, or just albuterol  Consider trying plain mucinex to see if it loosens mucus so you can cough out more easily  Ok to keep appointment in July

## 2016-01-11 NOTE — Telephone Encounter (Signed)
Per OV today: Patient Instructions     Order- lab- BNP, BMET, Penicillin IgE antibodies Suggest you make appointment to see your heart doctor about CHF  Script sent for Zpak Sample Anoro Ellipta inhaler Inhale 1 puff, once daily Try this until sample used up Don't use the Atrovent inhaler while you are using the Anoro Notice for our information, if the nebulizer ampules you have are ipratropium-albuterol, or just albuterol Consider trying plain mucinex to see if it loosens mucus so you can cough out more easily Ok to keep appointment in July  ----  Per pt advice sent in today: I was just at an office visit with [name] and we talked about which kind of medicine do I use in my nebulizer. It has one medicine instead of the two we talked about. I have ALBUTEROL SULFATE INHALATION SOLUTION 0.083% 3 ML USE 1 VIRAL IN NEBULIZER EVERY 6 HOURS. Please let me know if I should do anything different than what we talked about.  Thank you, [name]   Please advise [name] thanks

## 2016-01-12 DIAGNOSIS — Z880 Allergy status to penicillin: Secondary | ICD-10-CM | POA: Insufficient documentation

## 2016-01-12 LAB — ALLERGEN PENICILLIN G IGE: PENICILLIN G IGE ALLERGEN: <0.1 kU/L

## 2016-01-12 LAB — ALLERGEN PENICILLIN V (MINOR): Allergen Penicillin V (Minor): <0.1 kU/L

## 2016-01-12 NOTE — Telephone Encounter (Signed)
What he is using should be fine for him. No changes needed now.

## 2016-01-12 NOTE — Assessment & Plan Note (Signed)
Persistent chest congestion and mildly discolored sputum suggest a persistent bronchitis which might respond to antibiotic. There is probably underlying mild CHF as well. Plan-Z-Pak

## 2016-01-12 NOTE — Assessment & Plan Note (Signed)
CXR 11/30/2015 suggested "mild congestive heart failure with mild pulmonary interstitial edema". He has a pending cardiology appointment. Current symptoms of gradually worsening dyspnea on exertion over several months would be consistent with increasing lung water.

## 2016-01-13 LAB — ALLERGEN AMOXICILLIN
Amoxicillin IgE Class: 0
Amoxicillin IgE kU/L: <0.1 (ref <0.10)

## 2016-01-18 ENCOUNTER — Telehealth (HOSPITAL_COMMUNITY): Payer: Self-pay | Admitting: Pharmacist

## 2016-01-18 NOTE — Telephone Encounter (Signed)
Entresto PA approved by [employer] through 12/10/2016.   Ruta Hinds. Velva Harman, PharmD, BCPS, CPP Clinical Pharmacist Pager: [phone] Phone: [phone] 01/18/2016 11:12 AM

## 2016-01-19 ENCOUNTER — Encounter: Payer: Self-pay | Admitting: Cardiology

## 2016-01-19 NOTE — Telephone Encounter (Signed)
Verified with [facility] that Delene Loll has gone through for $0. Mrs. Pasch notified.   Ruta Hinds. Velva Harman, PharmD, BCPS, CPP Clinical Pharmacist Pager: [phone] Phone: [phone] 01/19/2016 9:37 AM

## 2016-01-24 ENCOUNTER — Telehealth: Payer: Self-pay | Admitting: Internal Medicine

## 2016-01-24 NOTE — Telephone Encounter (Signed)
Notes Recorded by Deneise Lever, MD on 01/13/2016 at 1:51 PM Labs- The blood test marker for heart failure is somewhat elevated. This result will be available to the cardiologist.      The blood tests for penicillin and amoxacillin allergy antibodies are negative. That makes dangerous allergic reaction to penicillins, amoxacillin or augmentin very unlikely. ---  Called spoke with spouse and is aware of results. Pt was in the room as well and verbalized understanding. Nothing further needed

## 2016-02-02 ENCOUNTER — Ambulatory Visit (HOSPITAL_COMMUNITY)
Admission: RE | Admit: 2016-02-02 | Discharge: 2016-02-02 | Disposition: A | Discharge status: Home / Self Care | Payer: Medicare Other | Source: Ambulatory Visit | Attending: Cardiology | Admitting: Cardiology

## 2016-02-02 ENCOUNTER — Encounter (HOSPITAL_COMMUNITY): Payer: Self-pay

## 2016-02-02 ENCOUNTER — Ambulatory Visit (INDEPENDENT_AMBULATORY_CARE_PROVIDER_SITE_OTHER): Payer: Medicare Other | Admitting: *Deleted

## 2016-02-02 VITALS — BP 136/60 | HR 70 | Wt 259.5 lb

## 2016-02-02 DIAGNOSIS — I739 Peripheral vascular disease, unspecified: Secondary | ICD-10-CM

## 2016-02-02 DIAGNOSIS — J449 Chronic obstructive pulmonary disease, unspecified: Secondary | ICD-10-CM | POA: Insufficient documentation

## 2016-02-02 DIAGNOSIS — R059 Cough, unspecified: Secondary | ICD-10-CM

## 2016-02-02 DIAGNOSIS — Z951 Presence of aortocoronary bypass graft: Secondary | ICD-10-CM

## 2016-02-02 DIAGNOSIS — R05 Cough: Secondary | ICD-10-CM | POA: Diagnosis not present

## 2016-02-02 DIAGNOSIS — I255 Ischemic cardiomyopathy: Secondary | ICD-10-CM

## 2016-02-02 DIAGNOSIS — J45909 Unspecified asthma, uncomplicated: Secondary | ICD-10-CM | POA: Insufficient documentation

## 2016-02-02 DIAGNOSIS — I517 Cardiomegaly: Secondary | ICD-10-CM | POA: Diagnosis not present

## 2016-02-02 DIAGNOSIS — R0600 Dyspnea, unspecified: Secondary | ICD-10-CM | POA: Diagnosis present

## 2016-02-02 DIAGNOSIS — I5042 Chronic combined systolic (congestive) and diastolic (congestive) heart failure: Secondary | ICD-10-CM

## 2016-02-02 DIAGNOSIS — J441 Chronic obstructive pulmonary disease with (acute) exacerbation: Secondary | ICD-10-CM

## 2016-02-02 DIAGNOSIS — R918 Other nonspecific abnormal finding of lung field: Secondary | ICD-10-CM | POA: Diagnosis not present

## 2016-02-02 LAB — COMPREHENSIVE METABOLIC PANEL
ALT: 20 U/L (ref 17–63)
AST: 20 U/L (ref 15–41)
Albumin: 3.9 g/dL (ref 3.5–5.0)
Alkaline Phosphatase: 70 U/L (ref 38–126)
Anion gap: 13 (ref 5–15)
BUN: 18 mg/dL (ref 6–20)
CO2: 26 mmol/L (ref 22–32)
Calcium: 9.2 mg/dL (ref 8.9–10.3)
Chloride: 104 mmol/L (ref 101–111)
Creatinine, Ser: 0.79 mg/dL (ref 0.61–1.24)
GFR calc Af Amer: >60 mL/min (ref >60)
GFR calc non Af Amer: >60 mL/min (ref >60)
Glucose, Bld: 103 mg/dL — ABNORMAL HIGH (ref 65–99)
Potassium: 3.8 mmol/L (ref 3.5–5.1)
Sodium: 143 mmol/L (ref 135–145)
Total Bilirubin: 0.7 mg/dL (ref 0.3–1.2)
Total Protein: 6.7 g/dL (ref 6.5–8.1)

## 2016-02-02 LAB — BRAIN NATRIURETIC PEPTIDE: B Natriuretic Peptide: 112.5 pg/mL — ABNORMAL HIGH (ref 0.0–100.0)

## 2016-02-02 LAB — TSH: TSH: 2.871 u[IU]/mL (ref 0.350–4.500)

## 2016-02-02 MED ORDER — DOXYCYCLINE HYCLATE 100 MG PO CAPS: 100.0000 mg | ORAL_CAPSULE | Freq: Two times a day (BID) | ORAL | Status: DC

## 2016-02-02 MED ORDER — PREDNISONE 10 MG (21) PO TBPK: 10.0000 mg | ORAL_TABLET | Freq: Every day | ORAL | Status: DC

## 2016-02-02 NOTE — Patient Instructions (Signed)
Routine lab work today. Will notify you of abnormal results, otherwise no news is good news!  Chest xray today.  Will call with results.  INCREASE Estresto to 49/'51mg'$  tablet twice daily.  START Doxycycline '100mg'$  tablet twice daily X 7 days.  START Prednisone taper pack. 4 tabs X 3 days. 2 tabs X 3 days. 1 tab X 3 days.  Follow up 2 weeks with [name].  Do the following things EVERYDAY: 1) Weigh yourself in the morning before breakfast. Write it down and keep it in a log. 2) Take your medicines as prescribed 3) Eat low salt foods-Limit salt (sodium) to 2000 mg per day.  4) Stay as active as you can everyday 5) Limit all fluids for the day to less than 2 liters

## 2016-02-02 NOTE — Progress Notes (Signed)
Remote ICD transmission.   

## 2016-02-03 NOTE — Progress Notes (Signed)
Patient ID: [name], male   DOB: [birth year], 70 y.o.   MRN: [id number] [facility]  02/03/2016 [name]   1946-12-01  [id number]  Primary Physicia [name], MD Hematologist: Dr Alen Blew  Pulmonary: Dr Annamaria Boots Cardiology: [name]  HPI:  [name] is a pleasant 70 yo with a past medical history significant for morbid obesity, systolic heart failure with an EF of 30-35% (January 2014) -> 20-25% (March 2015 - RV normal), VT on amio, CAD s/p CABG 1996, LBBB, COPD, obstructive sleep apnea on CPAP, AAA repair (January 2015) who was admitted to [facility] on 02/24/14 for acute on chronic HF and acute COPD exacerbation. He also had iron deficiency anemia.   Admitted to [facility] July 24 through July 06 2014 with chest pain.  CEs negative. Had a LHC with no change from previous LHC with recommendations to continue medical management. Discharge weight was 255 pounds.   LHC 07/06/14 --No significant change from previous studies.  Left anterior descending (LAD): The LAD is a large vessel. There is a 40% stenosis immediately after the takeoff of a large septal perforator. There are 2 large diagonal branches without significant disease. Left circumflex (LCx): The LCx is occluded proximally. There are left to left collaterals.  Right coronary artery (RCA): The RCA is occluded proximally immediately following the conus branch. There are right to right and left to right collaterals. SVGs from CABG known to be totally occluded.   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   CTA chest/abd/pelvis (3/16) with moderate emphysema, 1.3 cm nodule RUL, left SFA totally occluded, right SFA with significant stenosis, s/p endovascular AAA repair (stable).   He returns for follow up.  He has been having trouble recently with COPD and wheezing.  He has been coughing on and off for > 2 months, bringing up yellow sputum.  He finished a Z-pack not long ago.  He has been congested.  Short of breath walking 50  feet, this is worse than in the past.  He is wheezing today.  He sleeps on 2 pillows chronically. No chest pain.  Weight is stable.    CXR was done today: No PNA, bronchitic changes, no prominent pulmonary edema.   Labs 3/15 Cr 1.5 K 5.6 Labs 07/06/14 K 3.9 Creatinine  0.98 Labs 9/15 K 5.1, creatinine 0.96 Labs 11/15 K 3.8, creatinine 0.83, LDL 61, LFTs normal, TSH normal Labs 11/26/14 K 5.0 Creatinine 0.73  Labs 12/08/14 K 3.4 Creatinine 0.83  Labs 3/16 K 4.4, creatinine 1.03, LFTs normal, TSH normal, HCT 37.8, BNP 65 Labs 6/16 K 4.1, creatinine 0.93, TSH normal, LFTs normal Labs 11/16 K 5.1, creatinine 1.13, LFTs normal, TSH normal, LDL 83, HDL 33, TGs 162 Labs 1/17 K 4.4, creatinine 0.86, pro-BNP 154  Past Medical History  Diagnosis Date  . Myocardial infarction ([place])   . Hypertension   . CHF (congestive heart failure) ([place])   . COPD (chronic obstructive pulmonary disease) ([place])   . GERD (gastroesophageal reflux disease)   . AAA (abdominal aortic aneurysm) ([place])     followed by Dr. Bridgett Larsson  . Tremor   . Dysrhythmia     ICD-defibrillator  . Carotid artery stenosis     LCEA - Dr. Bridgett Larsson in 2013  . Peripheral vascular disease ([facility])     LCEA, L renal artery stent, bilat iliac stents, R SFA stenosis  . Adenomatous colon polyp 01/2004  . Diverticulosis   . AVM (arteriovenous malformation)   . CAD (coronary artery  disease)     s/p CABGx2 in 1996  . HLD (hyperlipidemia)   . Hypothyroidism   . BPH (benign prostatic hypertrophy)   . Fatigue   . Automatic implantable cardioverter-defibrillator in situ   . OSA on CPAP     AHI durign total sleep 14.69/hr, during REM 50.91/hr  . H/O hiatal hernia   . Anemia     hx  . Complication of anesthesia     claustrophobic, unabe to lie on back more than 4 hours at time due to back  . Ischemic cardiomyopathy   . Ventricular tachycardia ([place]) 09/09/2014    Amiodarone was started after appropriate defibrillator shocks for ventricular tachycardia in  October 2008  . Diabetes mellitus     DIET CONTROLLED- pt states that this was a misdiagnosis, he was treated while in the hospital  but returned home, loss a massive amount of weight and he has not had a problem with his blood sugar since. States everything has been normal for about 3 years.    Current Outpatient Prescriptions  Medication Sig Dispense Refill  . albuterol (PROVENTIL) (2.5 MG/3ML) 0.083% nebulizer solution Take 3 mLs (2.5 mg total) by nebulization every 6 (six) hours. 360 mL 6  . amiodarone (PACERONE) 200 MG tablet Take 1 tablet by mouth  daily 90 tablet 3  . aspirin 81 MG tablet Take 81 mg by mouth daily.    Marland Kitchen atorvastatin (LIPITOR) 40 MG tablet Take 1 tablet by mouth  daily 90 tablet 3  . carvedilol (COREG) 25 MG tablet Take 1 tablet by mouth  twice a day with meals 180 tablet 3  . clopidogrel (PLAVIX) 75 MG tablet Take 1 tablet by mouth  daily 90 tablet 3  . finasteride (PROSCAR) 5 MG tablet Take 5 mg by mouth daily.    . fluticasone (FLONASE) 50 MCG/ACT nasal spray Instill 1 spray in each  nostril daily 16 g 3  . furosemide (LASIX) 80 MG tablet Take 1 tablet by mouth two  times daily 180 tablet 3  . ipratropium (ATROVENT HFA) 17 MCG/ACT inhaler Inhale 2 puffs into the lungs every 4 (four) hours as needed for wheezing. 1 Inhaler 12  . isosorbide mononitrate (IMDUR) 60 MG 24 hr tablet Take 1 tablet by mouth  daily 90 tablet 3  . levothyroxine (SYNTHROID, LEVOTHROID) 50 MCG tablet Take 1 tablet by mouth  daily before breakfast 90 tablet 1  . Multiple Vitamins-Minerals (MENS 50+ MULTI VITAMIN/MIN) TABS Take 1 tablet by mouth daily.     . multivitamin-lutein (OCUVITE-LUTEIN) CAPS Take 1 capsule by mouth daily.      . NON FORMULARY at bedtime. CPAP And during the day as needed    . spironolactone (ALDACTONE) 25 MG tablet Take 1 tablet by mouth  daily 90 tablet 3  . albuterol (PROVENTIL HFA;VENTOLIN HFA) 108 (90 BASE) MCG/ACT inhaler Inhale 2 puffs into the lungs every 6 (six)  hours as needed for wheezing or shortness of breath. (Patient not taking: Reported on 02/02/2016) 3 Inhaler 4  . doxycycline (VIBRAMYCIN) 100 MG capsule Take 1 capsule (100 mg total) by mouth 2 (two) times daily. 14 capsule 0  . NITROSTAT 0.4 MG SL tablet Dissolve one tablet under  the tongue every 5 minutes  as needed for chest pain  (up to 3 tabs in 15 minutes then call 911) (Patient not taking: Reported on 02/02/2016) 25 tablet 6  . predniSONE (STERAPRED UNI-PAK 21 TAB) 10 MG (21) TBPK tablet Take 1 tablet (10 mg  total) by mouth daily. 4 tabs X 3 day, 2 tabs X 3 days, 1 tab X 3 days 21 tablet 0   Current Facility-Administered Medications  Medication Dose Route Frequency Provider Last Rate Last Dose  . ipratropium-albuterol (DUONEB) 0.5-2.5 (3) MG/3ML nebulizer solution 3 mL  3 mL Nebulization Once [name], MD        Allergies  Allergen Reactions  . Tussionex Pennkinetic Er [Hydrocod Polst-Cpm Polst Er] Other (See Comments)    "caused prostate problems"  . Ace Inhibitors Other (See Comments)    REACTION: cough    Social History   Social History  . Marital Status: Married    Spouse Name: [name]  . Number of Children: 2  . Years of Education: [name]   Occupational History  . retired Solicitor     Social History Main Topics  . Smoking status: Former Smoker -- 1.50 packs/day for 40 years    Types: Cigarettes    Quit date: 12/11/2002  . Smokeless tobacco: Former Systems developer  . Alcohol Use: No  . Drug Use: No  . Sexual Activity: Not Currently   Other Topics Concern  . Not on file   Social History Narrative    Family History  Problem Relation Age of Onset  . Colon cancer Sister   . Cancer Sister   . Hypertension Sister   . Hyperlipidemia Sister   . Diabetes Sister   . Heart disease Sister     before age 70  . Heart disease Brother   . Lung cancer Mother   . Cancer Mother   . Diabetes Mother   . Hypertension Mother   . Hyperlipidemia Mother   . Heart disease  Mother     before age 20  . Heart attack Maternal Grandmother   . Colon polyps Sister   . Heart attack Father   . Heart disease Father     before age 29  . Hypertension Father   . COPD Sister   . Diabetes Daughter    Blood pressure 136/60, pulse 70, weight 259 lb 8 oz (117.708 kg), SpO2 95 %.  General appearance: alert, cooperative and no distress.  Neck: Thick.  No carotid bruit or JVP appreciated. L CEA scar Lungs: Diffuse wheezes bilaterally    Heart: regular rate and rhythm, S1, S2 normal, 1/6 SEM at RUSB.  No S3/S4.  Extremities:  No clubbing/cyanosis. Trace ankle edema. Skin: warm and dry Neurologic: Grossly normal  ASSESSMENT/PLAN:  1. Chronic systolic CHF: Ischemic cardiomyopathy. ECHO 02/2014 EF 20-25%.  [employer] CRT-D. NYHA class III symptoms.  He is more short of breath but does not look volume overloaded on exam.  Recent BNP was lower than prior and weight is not significantly up.  CXR was done today, did not show significant pulmonary edema.  - Continue lasix 80 mg twice a day.     - Continue carvedilol 25 mg twice a day  - Increase Entresto to 49/51 bid.  This will allow some additional diuresis.  BMET/BNP today and repeat BMET in 2 wks.  - Continue spironolactone 25 mg daily.  - He is due for a repeat echo, will arrange.  2. CAD s/p CABG: No chest pain. Continue 81 mg aspirin daily and atorvastatin daily.  Good lipids when checked in 11/16. 3. VT s/p ICD: On amiodarone for history of ICD discharges due to VT.  Check LFTs/TSH today.  He should get yearly eye exams.  4. Morbid obesity: Continue to  work on diet/exercise.   5. COPD: Moderate to severe. Has lung nodule, positionally not a great candidate for biopsy.  His recent symptoms seem to be most likely due to COPD exacerbation.  He is wheezing on exam.  CXR showed bronchitic changes but no PNA.  - Doxycycline 100 mg bid x 7 days.  - Prednisone 40 mg daily x 3 days, then 20 mg daily x 3 days, then 10 mg daily x  3 days.   - Continue using nebulizer at home.  - Pulmonary followup.  6. PAD: Occluded left SFA and significant right SFA stenosis on last CTA.  He does not report claudication and does not have pedal ulcers.  Followed by [facility]. He is on statin.   Followup in 2 weeks.   [name] 02/03/2016

## 2016-02-15 ENCOUNTER — Ambulatory Visit (HOSPITAL_COMMUNITY)
Admission: RE | Admit: 2016-02-15 | Discharge: 2016-02-15 | Disposition: A | Discharge status: Home / Self Care | Payer: Medicare Other | Source: Ambulatory Visit | Attending: Cardiology | Admitting: Cardiology

## 2016-02-15 VITALS — BP 118/56 | HR 55 | Wt 267.0 lb

## 2016-02-15 DIAGNOSIS — Z951 Presence of aortocoronary bypass graft: Secondary | ICD-10-CM | POA: Insufficient documentation

## 2016-02-15 DIAGNOSIS — I255 Ischemic cardiomyopathy: Secondary | ICD-10-CM | POA: Insufficient documentation

## 2016-02-15 DIAGNOSIS — I739 Peripheral vascular disease, unspecified: Secondary | ICD-10-CM | POA: Diagnosis not present

## 2016-02-15 DIAGNOSIS — Z833 Family history of diabetes mellitus: Secondary | ICD-10-CM | POA: Diagnosis not present

## 2016-02-15 DIAGNOSIS — I2510 Atherosclerotic heart disease of native coronary artery without angina pectoris: Secondary | ICD-10-CM | POA: Insufficient documentation

## 2016-02-15 DIAGNOSIS — Z888 Allergy status to other drugs, medicaments and biological substances status: Secondary | ICD-10-CM | POA: Insufficient documentation

## 2016-02-15 DIAGNOSIS — Z95810 Presence of automatic (implantable) cardiac defibrillator: Secondary | ICD-10-CM | POA: Diagnosis not present

## 2016-02-15 DIAGNOSIS — Z8249 Family history of ischemic heart disease and other diseases of the circulatory system: Secondary | ICD-10-CM | POA: Insufficient documentation

## 2016-02-15 DIAGNOSIS — K219 Gastro-esophageal reflux disease without esophagitis: Secondary | ICD-10-CM | POA: Diagnosis not present

## 2016-02-15 DIAGNOSIS — R911 Solitary pulmonary nodule: Secondary | ICD-10-CM | POA: Diagnosis not present

## 2016-02-15 DIAGNOSIS — Z825 Family history of asthma and other chronic lower respiratory diseases: Secondary | ICD-10-CM | POA: Insufficient documentation

## 2016-02-15 DIAGNOSIS — Z7982 Long term (current) use of aspirin: Secondary | ICD-10-CM | POA: Diagnosis not present

## 2016-02-15 DIAGNOSIS — I5042 Chronic combined systolic (congestive) and diastolic (congestive) heart failure: Secondary | ICD-10-CM | POA: Diagnosis not present

## 2016-02-15 DIAGNOSIS — Z87891 Personal history of nicotine dependence: Secondary | ICD-10-CM | POA: Insufficient documentation

## 2016-02-15 DIAGNOSIS — G4733 Obstructive sleep apnea (adult) (pediatric): Secondary | ICD-10-CM | POA: Insufficient documentation

## 2016-02-15 DIAGNOSIS — I5022 Chronic systolic (congestive) heart failure: Secondary | ICD-10-CM | POA: Diagnosis not present

## 2016-02-15 DIAGNOSIS — I110 Hypertensive heart disease with heart failure: Secondary | ICD-10-CM | POA: Insufficient documentation

## 2016-02-15 DIAGNOSIS — Z79899 Other long term (current) drug therapy: Secondary | ICD-10-CM | POA: Insufficient documentation

## 2016-02-15 DIAGNOSIS — I252 Old myocardial infarction: Secondary | ICD-10-CM | POA: Insufficient documentation

## 2016-02-15 DIAGNOSIS — J449 Chronic obstructive pulmonary disease, unspecified: Secondary | ICD-10-CM | POA: Insufficient documentation

## 2016-02-15 DIAGNOSIS — E785 Hyperlipidemia, unspecified: Secondary | ICD-10-CM | POA: Diagnosis not present

## 2016-02-15 DIAGNOSIS — E039 Hypothyroidism, unspecified: Secondary | ICD-10-CM | POA: Insufficient documentation

## 2016-02-15 LAB — BASIC METABOLIC PANEL
Anion gap: 11 (ref 5–15)
BUN: 17 mg/dL (ref 6–20)
CO2: 26 mmol/L (ref 22–32)
Calcium: 9 mg/dL (ref 8.9–10.3)
Chloride: 105 mmol/L (ref 101–111)
Creatinine, Ser: 0.85 mg/dL (ref 0.61–1.24)
GFR calc Af Amer: >60 mL/min (ref >60)
GFR calc non Af Amer: >60 mL/min (ref >60)
Glucose, Bld: 88 mg/dL (ref 65–99)
Potassium: 4.6 mmol/L (ref 3.5–5.1)
Sodium: 142 mmol/L (ref 135–145)

## 2016-02-15 NOTE — Patient Instructions (Signed)
Labs today  Your physician recommends that you schedule a follow-up appointment in: 6 weeks  

## 2016-02-15 NOTE — Progress Notes (Signed)
Patient ID: [name], male   DOB: 1946/03/26, 70 y.o.   MRN: [id number] [facility]  02/15/2016 [name]   1945/12/25  [id number]  Primary Physicia [name], MD Hematologist: Dr Alen Blew  Pulmonary: Dr Annamaria Boots Cardiology: [name]  HPI:  [name] is a pleasant 70 yo with a past medical history significant for morbid obesity, systolic heart failure with an EF of 30-35% (January 2014) -> 20-25% (March 2015 - RV normal), VT on amio, CAD s/p CABG 1996, LBBB, COPD, obstructive sleep apnea on CPAP, AAA repair (January 2015) who was admitted to [facility] on 02/24/14 for acute on chronic HF and acute COPD exacerbation. He also had iron deficiency anemia.   Admitted to [facility] July 24 through July 06 2014 with chest pain.  CEs negative. Had a LHC with no change from previous LHC with recommendations to continue medical management. Discharge weight was 255 pounds.   LHC 07/06/14 --No significant change from previous studies.  Left anterior descending (LAD): The LAD is a large vessel. There is a 40% stenosis immediately after the takeoff of a large septal perforator. There are 2 large diagonal branches without significant disease. Left circumflex (LCx): The LCx is occluded proximally. There are left to left collaterals.  Right coronary artery (RCA): The RCA is occluded proximally immediately following the conus branch. There are right to right and left to right collaterals. SVGs from CABG known to be totally occluded.   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   CTA chest/abd/pelvis (3/16) with moderate emphysema, 1.3 cm nodule RUL, left SFA totally occluded, right SFA with significant stenosis, s/p endovascular AAA repair (stable).   He returns for follow up.  At last visit, he was thought to have a COPD exacerbation.  I gave him a course of prednisone and doxycycline.  He is now feeling much better.  Wheezing and cough have resolved for the most part.  Weight is up, he says that  his appetite was increased a lot while on prednisone.  No dyspnea walking on flat ground.  Short of breath with inclines.  No chest pain.   Labs 3/15 Cr 1.5 K 5.6 Labs 07/06/14 K 3.9 Creatinine  0.98 Labs 9/15 K 5.1, creatinine 0.96 Labs 11/15 K 3.8, creatinine 0.83, LDL 61, LFTs normal, TSH normal Labs 11/26/14 K 5.0 Creatinine 0.73  Labs 12/08/14 K 3.4 Creatinine 0.83  Labs 3/16 K 4.4, creatinine 1.03, LFTs normal, TSH normal, HCT 37.8, BNP 65 Labs 6/16 K 4.1, creatinine 0.93, TSH normal, LFTs normal Labs 11/16 K 5.1, creatinine 1.13, LFTs normal, TSH normal, LDL 83, HDL 33, TGs 162 Labs 1/17 K 4.4, creatinine 0.86, pro-BNP 154 Labs 2/17 BNP 113, K 3.8, creatinine 0.79, TSH normal, LFTs normal  Past Medical History  Diagnosis Date  . Myocardial infarction ([place])   . Hypertension   . CHF (congestive heart failure) ([place])   . COPD (chronic obstructive pulmonary disease) ([place])   . GERD (gastroesophageal reflux disease)   . AAA (abdominal aortic aneurysm) ([place])     followed by Dr. Bridgett Larsson  . Tremor   . Dysrhythmia     ICD-defibrillator  . Carotid artery stenosis     LCEA - Dr. Bridgett Larsson in 2013  . Peripheral vascular disease ([facility])     LCEA, L renal artery stent, bilat iliac stents, R SFA stenosis  . Adenomatous colon polyp 01/2004  . Diverticulosis   . AVM (arteriovenous malformation)   . CAD (coronary artery disease)  s/p CABGx2 in 1996  . HLD (hyperlipidemia)   . Hypothyroidism   . BPH (benign prostatic hypertrophy)   . Fatigue   . Automatic implantable cardioverter-defibrillator in situ   . OSA on CPAP     AHI durign total sleep 14.69/hr, during REM 50.91/hr  . H/O hiatal hernia   . Anemia     hx  . Complication of anesthesia     claustrophobic, unabe to lie on back more than 4 hours at time due to back  . Ischemic cardiomyopathy   . Ventricular tachycardia ([place]) 09/09/2014    Amiodarone was started after appropriate defibrillator shocks for ventricular tachycardia in October  2008  . Diabetes mellitus     DIET CONTROLLED- pt states that this was a misdiagnosis, he was treated while in the hospital  but returned home, loss a massive amount of weight and he has not had a problem with his blood sugar since. States everything has been normal for about 3 years.    Current Outpatient Prescriptions  Medication Sig Dispense Refill  . albuterol (PROVENTIL HFA;VENTOLIN HFA) 108 (90 BASE) MCG/ACT inhaler Inhale 2 puffs into the lungs every 6 (six) hours as needed for wheezing or shortness of breath. 3 Inhaler 4  . albuterol (PROVENTIL) (2.5 MG/3ML) 0.083% nebulizer solution Take 3 mLs (2.5 mg total) by nebulization every 6 (six) hours. 360 mL 6  . amiodarone (PACERONE) 200 MG tablet Take 1 tablet by mouth  daily 90 tablet 3  . aspirin 81 MG tablet Take 81 mg by mouth daily.    Marland Kitchen atorvastatin (LIPITOR) 40 MG tablet Take 1 tablet by mouth  daily 90 tablet 3  . carvedilol (COREG) 25 MG tablet Take 1 tablet by mouth  twice a day with meals 180 tablet 3  . clopidogrel (PLAVIX) 75 MG tablet Take 1 tablet by mouth  daily 90 tablet 3  . finasteride (PROSCAR) 5 MG tablet Take 5 mg by mouth daily.    . fluticasone (FLONASE) 50 MCG/ACT nasal spray Instill 1 spray in each  nostril daily 16 g 3  . furosemide (LASIX) 80 MG tablet Take 1 tablet by mouth two  times daily 180 tablet 3  . ipratropium (ATROVENT HFA) 17 MCG/ACT inhaler Inhale 2 puffs into the lungs every 4 (four) hours as needed for wheezing. 1 Inhaler 12  . isosorbide mononitrate (IMDUR) 60 MG 24 hr tablet Take 1 tablet by mouth  daily 90 tablet 3  . levothyroxine (SYNTHROID, LEVOTHROID) 50 MCG tablet Take 1 tablet by mouth  daily before breakfast 90 tablet 1  . Multiple Vitamins-Minerals (MENS 50+ MULTI VITAMIN/MIN) TABS Take 1 tablet by mouth daily.     . multivitamin-lutein (OCUVITE-LUTEIN) CAPS Take 1 capsule by mouth daily.      Marland Kitchen NITROSTAT 0.4 MG SL tablet Dissolve one tablet under  the tongue every 5 minutes  as needed  for chest pain  (up to 3 tabs in 15 minutes then call 911) 25 tablet 6  . NON FORMULARY at bedtime. CPAP And during the day as needed    . sacubitril-valsartan (ENTRESTO) 49-51 MG Take 1 tablet by mouth 2 (two) times daily.    Marland Kitchen spironolactone (ALDACTONE) 25 MG tablet Take 1 tablet by mouth  daily 90 tablet 3   Current Facility-Administered Medications  Medication Dose Route Frequency Provider Last Rate Last Dose  . ipratropium-albuterol (DUONEB) 0.5-2.5 (3) MG/3ML nebulizer solution 3 mL  3 mL Nebulization Once [name], MD  Allergies  Allergen Reactions  . Tussionex Pennkinetic Er [Hydrocod Polst-Cpm Polst Er] Other (See Comments)    "caused prostate problems"  . Ace Inhibitors Other (See Comments)    REACTION: cough    Social History   Social History  . Marital Status: Married    Spouse Name: [name]  . Number of Children: 2  . Years of Education: [name]   Occupational History  . retired Solicitor     Social History Main Topics  . Smoking status: Former Smoker -- 1.50 packs/day for 40 years    Types: Cigarettes    Quit date: 12/11/2002  . Smokeless tobacco: Former Systems developer  . Alcohol Use: No  . Drug Use: No  . Sexual Activity: Not Currently   Other Topics Concern  . Not on file   Social History Narrative    Family History  Problem Relation Age of Onset  . Colon cancer Sister   . Cancer Sister   . Hypertension Sister   . Hyperlipidemia Sister   . Diabetes Sister   . Heart disease Sister     before age 74  . Heart disease Brother   . Lung cancer Mother   . Cancer Mother   . Diabetes Mother   . Hypertension Mother   . Hyperlipidemia Mother   . Heart disease Mother     before age 61  . Heart attack Maternal Grandmother   . Colon polyps Sister   . Heart attack Father   . Heart disease Father     before age 53  . Hypertension Father   . COPD Sister   . Diabetes Daughter    Blood pressure 118/56, pulse 55, weight 267 lb (121.11 kg),  SpO2 96 %.  General appearance: alert, cooperative and no distress.  Neck: Thick.  No carotid bruit or JVP appreciated. L CEA scar Lungs: Slight rhonchi bilaterally    Heart: regular rate and rhythm, S1, S2 normal, 1/6 SEM at RUSB.  No S3/S4.  Extremities:  No clubbing/cyanosis. No edema. Skin: warm and dry Neurologic: Grossly normal  ASSESSMENT/PLAN:  1. Chronic systolic CHF: Ischemic cardiomyopathy. ECHO 02/2014 EF 20-25%.  [employer] CRT-D. NYHA class II symptoms.  Volume status looks ok today. - Continue lasix 80 mg twice a day.  Check BMET.  - Continue carvedilol 25 mg twice a day  - Continue Entresto 49/51 bid.   - Continue spironolactone 25 mg daily.  - He needs echo, will arrange.  2. CAD s/p CABG: No chest pain. Continue 81 mg aspirin daily and atorvastatin daily.  Good lipids when checked in 11/16. 3. VT s/p ICD: On amiodarone for history of ICD discharges due to VT.  LFTs/TSH normal in 2/17.  He should get yearly eye exams.  4. Morbid obesity: Continue to work on diet/exercise.   5. COPD: Moderate to severe. Has lung nodule, positionally not a great candidate for biopsy.  He had a recent COPD exacerbation.  Lungs now sound better.  - Pulmonary followup.  6. PAD: Occluded left SFA and significant right SFA stenosis on last CTA.  He does not report claudication and does not have pedal ulcers.  Followed by [facility]. He is on statin.   Followup in 6 weeks.   [name] 02/15/2016

## 2016-02-23 ENCOUNTER — Other Ambulatory Visit: Payer: Self-pay | Admitting: Cardiology

## 2016-02-23 ENCOUNTER — Other Ambulatory Visit: Payer: Self-pay | Admitting: Internal Medicine

## 2016-02-28 ENCOUNTER — Ambulatory Visit (INDEPENDENT_AMBULATORY_CARE_PROVIDER_SITE_OTHER): Payer: Medicare Other | Admitting: Ophthalmology

## 2016-02-28 DIAGNOSIS — I10 Essential (primary) hypertension: Secondary | ICD-10-CM

## 2016-02-28 DIAGNOSIS — H43813 Vitreous degeneration, bilateral: Secondary | ICD-10-CM

## 2016-02-28 DIAGNOSIS — H35033 Hypertensive retinopathy, bilateral: Secondary | ICD-10-CM | POA: Diagnosis not present

## 2016-02-28 DIAGNOSIS — H353121 Nonexudative age-related macular degeneration, left eye, early dry stage: Secondary | ICD-10-CM | POA: Diagnosis not present

## 2016-02-28 DIAGNOSIS — H353114 Nonexudative age-related macular degeneration, right eye, advanced atrophic with subfoveal involvement: Secondary | ICD-10-CM

## 2016-03-01 ENCOUNTER — Encounter: Payer: Self-pay | Admitting: Cardiology

## 2016-03-01 LAB — CUP PACEART REMOTE DEVICE CHECK
Battery Remaining Longevity: 96 mo
Battery Remaining Percentage: 100 %
Brady Statistic RA Percent Paced: 0 %
Brady Statistic RV Percent Paced: 80 %
Date Time Interrogation Session: 20170222074100
HighPow Impedance: 49 Ohm
Implantable Lead Implant Date: 20060411
Implantable Lead Implant Date: 20151030
Implantable Lead Implant Date: 20151030
Implantable Lead Location: 753858
Implantable Lead Location: 753859
Implantable Lead Location: 753860
Implantable Lead Model: 148
Implantable Lead Model: 4136
Implantable Lead Serial Number: 145070
Implantable Lead Serial Number: 29713415
Lead Channel Impedance Value: 569 Ohm
Lead Channel Impedance Value: 597 Ohm
Lead Channel Impedance Value: 671 Ohm
Lead Channel Setting Pacing Amplitude: 2 V
Lead Channel Setting Pacing Amplitude: 2 V
Lead Channel Setting Pacing Amplitude: 3.5 V
Lead Channel Setting Pacing Pulse Width: 0.4 ms
Lead Channel Setting Pacing Pulse Width: 0.9 ms
Lead Channel Setting Sensing Sensitivity: 0.6 mV
Lead Channel Setting Sensing Sensitivity: 1 mV
Pulse Gen Serial Number: 370874

## 2016-03-15 ENCOUNTER — Telehealth: Payer: Self-pay | Admitting: Oncology

## 2016-03-15 NOTE — Telephone Encounter (Signed)
Due to call day moved 4/6 f/u to 4/20. Other appointments for 4/6 and 4/13 remain the same. Left message for patient confirming all appointments and added comment to 4/6 appointment note to send patient for new schedule.

## 2016-03-16 ENCOUNTER — Ambulatory Visit: Payer: Medicare Other | Admitting: Oncology

## 2016-03-16 ENCOUNTER — Ambulatory Visit: Payer: Medicare Other

## 2016-03-16 ENCOUNTER — Other Ambulatory Visit: Payer: Self-pay | Admitting: Medical Oncology

## 2016-03-16 ENCOUNTER — Other Ambulatory Visit (HOSPITAL_BASED_OUTPATIENT_CLINIC_OR_DEPARTMENT_OTHER): Payer: Medicare Other

## 2016-03-16 ENCOUNTER — Other Ambulatory Visit: Payer: Self-pay | Admitting: Oncology

## 2016-03-16 VITALS — BP 142/48 | HR 57 | Temp 97.8°F | Resp 18

## 2016-03-16 DIAGNOSIS — I255 Ischemic cardiomyopathy: Secondary | ICD-10-CM

## 2016-03-16 DIAGNOSIS — D509 Iron deficiency anemia, unspecified: Secondary | ICD-10-CM

## 2016-03-16 LAB — CBC WITH DIFFERENTIAL/PLATELET
BASO%: 0.8 % (ref 0.0–2.0)
Basophils Absolute: 0 10*3/uL (ref 0.0–0.1)
EOS%: 7.9 % — ABNORMAL HIGH (ref 0.0–7.0)
Eosinophils Absolute: 0.5 10*3/uL (ref 0.0–0.5)
HCT: 47.4 % (ref 38.4–49.9)
HGB: 15.6 g/dL (ref 13.0–17.1)
LYMPH%: 22.8 % (ref 14.0–49.0)
MCH: 30.5 pg (ref 27.2–33.4)
MCHC: 32.9 g/dL (ref 32.0–36.0)
MCV: 92.9 fL (ref 79.3–98.0)
MONO#: 0.7 10*3/uL (ref 0.1–0.9)
MONO%: 10.8 % (ref 0.0–14.0)
NEUT#: 3.6 10*3/uL (ref 1.5–6.5)
NEUT%: 57.7 % (ref 39.0–75.0)
Platelets: 149 10*3/uL (ref 140–400)
RBC: 5.1 10*6/uL (ref 4.20–5.82)
RDW: 13.9 % (ref 11.0–14.6)
WBC: 6.2 10*3/uL (ref 4.0–10.3)
lymph#: 1.4 10*3/uL (ref 0.9–3.3)

## 2016-03-16 MED ORDER — SODIUM CHLORIDE 0.9 % IV SOLN: 510.0000 mg | Freq: Once | INTRAVENOUS | Status: DC

## 2016-03-16 MED ORDER — SODIUM CHLORIDE 0.9 % IV SOLN: Freq: Once | INTRAVENOUS | Status: DC

## 2016-03-17 ENCOUNTER — Encounter: Payer: Self-pay | Admitting: Cardiology

## 2016-03-17 ENCOUNTER — Telehealth: Payer: Self-pay | Admitting: Oncology

## 2016-03-17 ENCOUNTER — Telehealth: Payer: Self-pay | Admitting: *Deleted

## 2016-03-17 ENCOUNTER — Other Ambulatory Visit: Payer: Self-pay | Admitting: Oncology

## 2016-03-17 DIAGNOSIS — D509 Iron deficiency anemia, unspecified: Secondary | ICD-10-CM

## 2016-03-17 LAB — IRON AND TIBC
%SAT: 25 % (ref 20–55)
Iron: 80 ug/dL (ref 42–163)
TIBC: 314 ug/dL (ref 202–409)
UIBC: 234 ug/dL (ref 117–376)

## 2016-03-17 LAB — FERRITIN: Ferritin: 41 ng/ml (ref 22–316)

## 2016-03-17 NOTE — Telephone Encounter (Signed)
left msg fro appt on 7/11 @ 9:00 and 7/11 '@9'$ :30

## 2016-03-17 NOTE — Telephone Encounter (Signed)
As noted below by Dr. Alen Blew, I informed patient's wife that his iron level is normal and will not need IV iron next week. He does not need to see Dr. Alen Blew in April. Someone from scheduling will call him to schedule lab/MD appointment in 3 months. Wife verbalized understanding.

## 2016-03-17 NOTE — Telephone Encounter (Signed)
-----   Message from [name], MD sent at 03/17/2016 10:00 AM EDT ----- Please let him know that his iron is normal and will not need IV iron next week. He will not need an MD follow up on in April.   He will need a follow with lab + MD in 3 months. POF sent.   Thanks.

## 2016-03-23 ENCOUNTER — Ambulatory Visit: Payer: Medicare Other

## 2016-03-27 NOTE — Progress Notes (Signed)
Patient ID: [name], male   DOB: Aug 23, 1946, 70 y.o.   MRN: [id number]    Advanced Heart Failure Clinic Note   03/28/2016 [name]   1946-01-27  [id number]  Primary Physicia [name], MD Hematologist: Dr Alen Blew  Pulmonary: Dr Annamaria Boots Cardiology: [name]  HPI:  [name] is a pleasant 70 yo with a past medical history significant for morbid obesity, systolic heart failure with an EF of 30-35% (January 2014) -> 20-25% (March 2015 - RV normal), VT on amio, CAD s/p CABG 1996, LBBB, COPD, obstructive sleep apnea on CPAP, AAA repair (January 2015) who was admitted to [facility] on 02/24/14 for acute on chronic HF and acute COPD exacerbation. He also had iron deficiency anemia.   Admitted to [facility] July 24 through July 06 2014 with chest pain.  CEs negative. Had a LHC with no change from previous LHC with recommendations to continue medical management. Discharge weight was 255 pounds.   LHC 07/06/14 --No significant change from previous studies.  Left anterior descending (LAD): The LAD is a large vessel. There is a 40% stenosis immediately after the takeoff of a large septal perforator. There are 2 large diagonal branches without significant disease. Left circumflex (LCx): The LCx is occluded proximally. There are left to left collaterals.  Right coronary artery (RCA): The RCA is occluded proximally immediately following the conus branch. There are right to right and left to right collaterals. SVGs from CABG known to be totally occluded.   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   CTA chest/abd/pelvis (3/16) with moderate emphysema, 1.3 cm nodule RUL, left SFA totally occluded, right SFA with significant stenosis, s/p endovascular AAA repair (stable).   He returns today for regular follow up. Echo mentioned at last visit but not arranged. Has continued to have trouble with his breathing, with lots of wheezing, worse over the past three weeks. Denies any edema. Has episodes where he feels  like he is suffocating.  Weight is down 11 lbs from last visit, has come down since off prednisone. He has SOB with minimal activity, usually accompanied by wheezing. No chest pain. Using CPAP nightly and as needed. No recent illness, no fever, or chills. Watching salt and fluid  Labs 3/15 Cr 1.5 K 5.6 Labs 07/06/14 K 3.9 Creatinine  0.98 Labs 9/15 K 5.1, creatinine 0.96 Labs 11/15 K 3.8, creatinine 0.83, LDL 61, LFTs normal, TSH normal Labs 11/26/14 K 5.0 Creatinine 0.73  Labs 12/08/14 K 3.4 Creatinine 0.83  Labs 3/16 K 4.4, creatinine 1.03, LFTs normal, TSH normal, HCT 37.8, BNP 65 Labs 6/16 K 4.1, creatinine 0.93, TSH normal, LFTs normal Labs 11/16 K 5.1, creatinine 1.13, LFTs normal, TSH normal, LDL 83, HDL 33, TGs 162 Labs 1/17 K 4.4, creatinine 0.86, pro-BNP 154 Labs 2/17 BNP 113, K 3.8, creatinine 0.79, TSH normal, LFTs normal  Past Medical History  Diagnosis Date  . Myocardial infarction ([place])   . Hypertension   . CHF (congestive heart failure) ([place])   . COPD (chronic obstructive pulmonary disease) ([place])   . GERD (gastroesophageal reflux disease)   . AAA (abdominal aortic aneurysm) ([place])     followed by Dr. Bridgett Larsson  . Tremor   . Dysrhythmia     ICD-defibrillator  . Carotid artery stenosis     LCEA - Dr. Bridgett Larsson in 2013  . Peripheral vascular disease ([facility])     LCEA, L renal artery stent, bilat iliac stents, R SFA stenosis  . Adenomatous colon polyp 01/2004  .  Diverticulosis   . AVM (arteriovenous malformation)   . CAD (coronary artery disease)     s/p CABGx2 in 1996  . HLD (hyperlipidemia)   . Hypothyroidism   . BPH (benign prostatic hypertrophy)   . Fatigue   . Automatic implantable cardioverter-defibrillator in situ   . OSA on CPAP     AHI durign total sleep 14.69/hr, during REM 50.91/hr  . H/O hiatal hernia   . Anemia     hx  . Complication of anesthesia     claustrophobic, unabe to lie on back more than 4 hours at time due to back  . Ischemic cardiomyopathy   .  Ventricular tachycardia ([place]) 09/09/2014    Amiodarone was started after appropriate defibrillator shocks for ventricular tachycardia in October 2008  . Diabetes mellitus     DIET CONTROLLED- pt states that this was a misdiagnosis, he was treated while in the hospital  but returned home, loss a massive amount of weight and he has not had a problem with his blood sugar since. States everything has been normal for about 3 years.    Current Outpatient Prescriptions  Medication Sig Dispense Refill  . albuterol (PROVENTIL HFA;VENTOLIN HFA) 108 (90 BASE) MCG/ACT inhaler Inhale 2 puffs into the lungs every 6 (six) hours as needed for wheezing or shortness of breath. 3 Inhaler 4  . albuterol (PROVENTIL) (2.5 MG/3ML) 0.083% nebulizer solution Take 3 mLs (2.5 mg total) by nebulization every 6 (six) hours. 360 mL 6  . amiodarone (PACERONE) 200 MG tablet Take 1 tablet by mouth  daily 90 tablet 3  . aspirin 81 MG tablet Take 81 mg by mouth daily.    Marland Kitchen atorvastatin (LIPITOR) 40 MG tablet Take 1 tablet by mouth  daily 90 tablet 3  . carvedilol (COREG) 25 MG tablet Take 1 tablet by mouth  twice a day with meals 180 tablet 3  . clopidogrel (PLAVIX) 75 MG tablet Take 1 tablet by mouth  daily 90 tablet 3  . finasteride (PROSCAR) 5 MG tablet Take 5 mg by mouth daily.    . fluticasone (FLONASE) 50 MCG/ACT nasal spray Instill 1 spray in each  nostril daily 16 g 3  . furosemide (LASIX) 80 MG tablet Take 1 tablet by mouth two  times daily 180 tablet 3  . ipratropium (ATROVENT HFA) 17 MCG/ACT inhaler Inhale 2 puffs into the lungs every 4 (four) hours as needed for wheezing. 1 Inhaler 12  . isosorbide mononitrate (IMDUR) 60 MG 24 hr tablet Take 1 tablet by mouth  daily 90 tablet 3  . levothyroxine (SYNTHROID, LEVOTHROID) 50 MCG tablet Take 1 tablet by mouth  daily before breakfast 90 tablet 0  . Multiple Vitamins-Minerals (MENS 50+ MULTI VITAMIN/MIN) TABS Take 1 tablet by mouth daily.     . multivitamin-lutein  (OCUVITE-LUTEIN) CAPS Take 1 capsule by mouth daily.      Marland Kitchen NITROSTAT 0.4 MG SL tablet Dissolve one tablet under  the tongue every 5 minutes  as needed for chest pain  (up to 3 tabs in 15 minutes then call 911) 25 tablet 6  . NON FORMULARY at bedtime. CPAP And during the day as needed    . sacubitril-valsartan (ENTRESTO) 49-51 MG Take 1 tablet by mouth 2 (two) times daily.    Marland Kitchen spironolactone (ALDACTONE) 25 MG tablet Take 1 tablet by mouth  daily 90 tablet 3   Current Facility-Administered Medications  Medication Dose Route Frequency Provider Last Rate Last Dose  . ipratropium-albuterol (DUONEB) 0.5-2.5 (3)  MG/3ML nebulizer solution 3 mL  3 mL Nebulization Once [name], MD        Allergies  Allergen Reactions  . Tussionex Pennkinetic Er [Hydrocod Polst-Cpm Polst Er] Other (See Comments)    "caused prostate problems"  . Ace Inhibitors Other (See Comments)    REACTION: cough    Social History   Social History  . Marital Status: Married    Spouse Name: [name]  . Number of Children: 2  . Years of Education: [name]   Occupational History  . retired Solicitor     Social History Main Topics  . Smoking status: Former Smoker -- 1.50 packs/day for 40 years    Types: Cigarettes    Quit date: 12/11/2002  . Smokeless tobacco: Former Systems developer  . Alcohol Use: No  . Drug Use: No  . Sexual Activity: Not Currently   Other Topics Concern  . Not on file   Social History Narrative    Family History  Problem Relation Age of Onset  . Colon cancer Sister   . Cancer Sister   . Hypertension Sister   . Hyperlipidemia Sister   . Diabetes Sister   . Heart disease Sister     before age 62  . Heart disease Brother   . Lung cancer Mother   . Cancer Mother   . Diabetes Mother   . Hypertension Mother   . Hyperlipidemia Mother   . Heart disease Mother     before age 7  . Heart attack Maternal Grandmother   . Colon polyps Sister   . Heart attack Father   . Heart disease  Father     before age 59  . Hypertension Father   . COPD Sister   . Diabetes Daughter    Blood pressure 144/62, pulse 94, weight 256 lb 3.2 oz (116.212 kg), SpO2 94 %.   Wt Readings from Last 3 Encounters:  03/28/16 256 lb 3.2 oz (116.212 kg)  02/15/16 267 lb (121.11 kg)  02/02/16 259 lb 8 oz (117.708 kg)    General appearance: WDWN, NAD.  Neck: Thick.  No carotid bruit or JVP appreciated. L CEA scar Lungs: Wheezing throughout.  Heart: RRR, S1, S2 normal, 1/6 SEM at RUSB.  No S3/S4.  Extremities:  No clubbing/cyanosis. Trace ankle edema Abdomen: Obese, NT, ND, no HSM. No bruits or masses. +BS  Skin: warm and dry Neurologic: Grossly normal  ASSESSMENT/PLAN:  1. Chronic systolic CHF: Ischemic cardiomyopathy. ECHO 02/2014 EF 20-25%.  [employer] CRT-D. NYHA class II symptoms.  Volume status stable on exam.  - Continue lasix 80 mg twice a day.  Check BMET.  - Continue carvedilol 25 mg twice a day  - Continue Entresto 49/51 bid.  (has been running out and using 24/26 tablets.) Consider increasing at next visit.  - Continue spironolactone 25 mg daily.  - He is over due for echo.  2. CAD s/p CABG: No chest pain. Continue 81 mg aspirin daily and atorvastatin daily.  Good lipids when checked in 11/16. 3. VT s/p ICD: On amiodarone for history of ICD discharges due to VT.  LFTs/TSH normal in 2/17.  He should get yearly eye exams.  4. Morbid obesity: Continue to work on diet/exercise.   5. COPD, exacerbation. Moderate to severe. Has lung nodule, positionally not a great candidate for biopsy.   - Diffuse wheezes on exam. Believe he is having another COPD exacerbation with more frequent inhaler and more SOB.  - Recommended he  see pulmonary ASAP.  6. PAD: Occluded left SFA and significant right SFA stenosis on last CTA.  He does not report claudication and does not have pedal ulcers.  Followed by [facility]. He is on statin.  7. HTN - Mildly elevated today in midst of likely COPD exacerbation.   Will consider increasing Entresto at next visit, for now, recommend pulmonary follow up as above. 8. HLD - Continue Atorvastatin 40 mg daily.   BMET, CBC today. Will schedule echo for 2-3 weeks. No med changes today with tenuous state with COPD exacerbation.  Likely needs to have long term COPD meds adjusted.   Follow up 1 month with MD.   Satira Mccallum Tillery PA-C 03/28/2016   Total time Spent: 30 minutes, over half spent discussing the above.

## 2016-03-28 ENCOUNTER — Ambulatory Visit (HOSPITAL_COMMUNITY)
Admission: RE | Admit: 2016-03-28 | Discharge: 2016-03-28 | Disposition: A | Discharge status: Home / Self Care | Payer: Medicare Other | Source: Ambulatory Visit | Attending: Cardiology | Admitting: Cardiology

## 2016-03-28 VITALS — BP 144/62 | HR 94 | Wt 256.2 lb

## 2016-03-28 DIAGNOSIS — Z888 Allergy status to other drugs, medicaments and biological substances status: Secondary | ICD-10-CM | POA: Insufficient documentation

## 2016-03-28 DIAGNOSIS — E039 Hypothyroidism, unspecified: Secondary | ICD-10-CM | POA: Diagnosis not present

## 2016-03-28 DIAGNOSIS — Z95810 Presence of automatic (implantable) cardiac defibrillator: Secondary | ICD-10-CM | POA: Diagnosis not present

## 2016-03-28 DIAGNOSIS — I255 Ischemic cardiomyopathy: Secondary | ICD-10-CM | POA: Diagnosis not present

## 2016-03-28 DIAGNOSIS — J449 Chronic obstructive pulmonary disease, unspecified: Secondary | ICD-10-CM | POA: Diagnosis not present

## 2016-03-28 DIAGNOSIS — K219 Gastro-esophageal reflux disease without esophagitis: Secondary | ICD-10-CM | POA: Diagnosis not present

## 2016-03-28 DIAGNOSIS — E785 Hyperlipidemia, unspecified: Secondary | ICD-10-CM | POA: Diagnosis not present

## 2016-03-28 DIAGNOSIS — I2510 Atherosclerotic heart disease of native coronary artery without angina pectoris: Secondary | ICD-10-CM | POA: Diagnosis not present

## 2016-03-28 DIAGNOSIS — Z87891 Personal history of nicotine dependence: Secondary | ICD-10-CM | POA: Diagnosis not present

## 2016-03-28 DIAGNOSIS — Z9989 Dependence on other enabling machines and devices: Secondary | ICD-10-CM

## 2016-03-28 DIAGNOSIS — I5042 Chronic combined systolic (congestive) and diastolic (congestive) heart failure: Secondary | ICD-10-CM

## 2016-03-28 DIAGNOSIS — I110 Hypertensive heart disease with heart failure: Secondary | ICD-10-CM | POA: Insufficient documentation

## 2016-03-28 DIAGNOSIS — Z79899 Other long term (current) drug therapy: Secondary | ICD-10-CM | POA: Insufficient documentation

## 2016-03-28 DIAGNOSIS — Z7982 Long term (current) use of aspirin: Secondary | ICD-10-CM | POA: Diagnosis not present

## 2016-03-28 DIAGNOSIS — G4733 Obstructive sleep apnea (adult) (pediatric): Secondary | ICD-10-CM

## 2016-03-28 DIAGNOSIS — Z833 Family history of diabetes mellitus: Secondary | ICD-10-CM | POA: Insufficient documentation

## 2016-03-28 DIAGNOSIS — I252 Old myocardial infarction: Secondary | ICD-10-CM | POA: Diagnosis not present

## 2016-03-28 DIAGNOSIS — I739 Peripheral vascular disease, unspecified: Secondary | ICD-10-CM

## 2016-03-28 DIAGNOSIS — I10 Essential (primary) hypertension: Secondary | ICD-10-CM

## 2016-03-28 DIAGNOSIS — Z825 Family history of asthma and other chronic lower respiratory diseases: Secondary | ICD-10-CM | POA: Insufficient documentation

## 2016-03-28 DIAGNOSIS — Z8249 Family history of ischemic heart disease and other diseases of the circulatory system: Secondary | ICD-10-CM | POA: Diagnosis not present

## 2016-03-28 DIAGNOSIS — Z951 Presence of aortocoronary bypass graft: Secondary | ICD-10-CM | POA: Diagnosis not present

## 2016-03-28 DIAGNOSIS — J441 Chronic obstructive pulmonary disease with (acute) exacerbation: Secondary | ICD-10-CM | POA: Insufficient documentation

## 2016-03-28 DIAGNOSIS — I714 Abdominal aortic aneurysm, without rupture: Secondary | ICD-10-CM | POA: Insufficient documentation

## 2016-03-28 DIAGNOSIS — I5022 Chronic systolic (congestive) heart failure: Secondary | ICD-10-CM | POA: Insufficient documentation

## 2016-03-28 DIAGNOSIS — R911 Solitary pulmonary nodule: Secondary | ICD-10-CM | POA: Diagnosis not present

## 2016-03-28 LAB — CBC
HCT: 48.1 % (ref 39.0–52.0)
Hemoglobin: 15.4 g/dL (ref 13.0–17.0)
MCH: 30.1 pg (ref 26.0–34.0)
MCHC: 32 g/dL (ref 30.0–36.0)
MCV: 94.1 fL (ref 78.0–100.0)
Platelets: 140 10*3/uL — ABNORMAL LOW (ref 150–400)
RBC: 5.11 MIL/uL (ref 4.22–5.81)
RDW: 13.4 % (ref 11.5–15.5)
WBC: 5.2 10*3/uL (ref 4.0–10.5)

## 2016-03-28 LAB — BASIC METABOLIC PANEL
Anion gap: 10 (ref 5–15)
BUN: 20 mg/dL (ref 6–20)
CO2: 26 mmol/L (ref 22–32)
Calcium: 9.3 mg/dL (ref 8.9–10.3)
Chloride: 103 mmol/L (ref 101–111)
Creatinine, Ser: 0.91 mg/dL (ref 0.61–1.24)
GFR calc Af Amer: >60 mL/min (ref >60)
GFR calc non Af Amer: >60 mL/min (ref >60)
Glucose, Bld: 102 mg/dL — ABNORMAL HIGH (ref 65–99)
Potassium: 3.8 mmol/L (ref 3.5–5.1)
Sodium: 139 mmol/L (ref 135–145)

## 2016-03-28 MED ORDER — SACUBITRIL-VALSARTAN 49-51 MG PO TABS: 1.0000 | ORAL_TABLET | Freq: Two times a day (BID) | ORAL | Status: DC

## 2016-03-28 NOTE — Patient Instructions (Signed)
Labs today  Your physician recommends that you schedule a follow-up appointment in: 4-6 weeks with [place] physician has requested that you have an echocardiogram. Echocardiography is a painless test that uses sound waves to create images of your heart. It provides your doctor with information about the size and shape of your heart and how well your heart's chambers and valves are working. This procedure takes approximately one hour. There are no restrictions for this procedure.  Do the following things EVERYDAY: 1) Weigh yourself in the morning before breakfast. Write it down and keep it in a log. 2) Take your medicines as prescribed 3) Eat low salt foods-Limit salt (sodium) to 2000 mg per day.  4) Stay as active as you can everyday 5) Limit all fluids for the day to less than 2 liters 6)

## 2016-03-29 ENCOUNTER — Telehealth: Payer: Self-pay | Admitting: Oncology

## 2016-03-29 NOTE — Telephone Encounter (Signed)
Returned call and lvm for pt confirm July appts.Marland KitchenMarland KitchenMarland Kitchen

## 2016-03-30 ENCOUNTER — Other Ambulatory Visit: Payer: Self-pay | Admitting: *Deleted

## 2016-03-30 ENCOUNTER — Encounter: Payer: Self-pay | Admitting: Internal Medicine

## 2016-03-30 ENCOUNTER — Ambulatory Visit: Payer: Medicare Other | Admitting: Oncology

## 2016-03-30 ENCOUNTER — Ambulatory Visit (INDEPENDENT_AMBULATORY_CARE_PROVIDER_SITE_OTHER): Payer: Medicare Other | Admitting: Internal Medicine

## 2016-03-30 VITALS — BP 124/76 | HR 64 | Ht 68.0 in | Wt 255.4 lb

## 2016-03-30 DIAGNOSIS — I255 Ischemic cardiomyopathy: Secondary | ICD-10-CM

## 2016-03-30 DIAGNOSIS — G4733 Obstructive sleep apnea (adult) (pediatric): Secondary | ICD-10-CM

## 2016-03-30 DIAGNOSIS — J449 Chronic obstructive pulmonary disease, unspecified: Secondary | ICD-10-CM | POA: Diagnosis not present

## 2016-03-30 DIAGNOSIS — Z9989 Dependence on other enabling machines and devices: Secondary | ICD-10-CM

## 2016-03-30 DIAGNOSIS — R911 Solitary pulmonary nodule: Secondary | ICD-10-CM

## 2016-03-30 MED ORDER — GLYCOPYRROLATE-FORMOTEROL 9-4.8 MCG/ACT IN AERO: 2.0000 | INHALATION_SPRAY | Freq: Two times a day (BID) | RESPIRATORY_TRACT | Status: DC

## 2016-03-30 NOTE — Progress Notes (Signed)
06/03/12- 70 yoM former smoker seen in pulmonary consultation at the request of Dr. Ronnald Ramp. Per Dr. Ronnald Ramp. Patient c/o sob and difficultly breathing when sitting while talking and walking.  Denies chest pain, chest tightness, wheeizng, and cough. Diagnosed with COPD 10 years ago. Wife is here. COPD Assessment Test (CAT) scored 24/40. He had smoked a pack a day half per day until 2005. 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 He was then diagnosed with hypothyroidism. Since then he has not exercised regularly, has felt weak with easier dyspnea on exertion. He denies cough or wheeze. Little change from day to day. His wife says he gets short of breath climbing 10 steps and he has to stop at the top. He has gained weight since thyroid diagnosis and with lack of exercise. He indicates 40 pound weight gain in the past 2 years. Coronary artery disease with history of multiple myocardial infarctions, congestive heart failure, hypertension, implanted AICD in left chest. No history of asthma. Ankles swell. Diagnosed obstructive sleep apnea/Dr. Kelly/CPAP/[facility], unknown pressure. Anemia 4 years ago required transfusion. Uses Spiriva daily, occasional rescue Combivent./ MI/ AICD, OSA(Dr Kelly)  07/16/12- 70 yoM former smoker followed for dyspnea/ COPD, complicated by hx OSA/ Dr Claiborne Billings, CAD/ CHF/ MI/ AICD Wife is here today Found to be anemic and now treated with iron. Very short of breath over the weekend/blames weather, limiting dyspnea on exertion then but today feels "great". Using Spiriva with rare need for rescue albuterol. Says he is getting bronchitis less often than in the past. COPD assessment test (CAT) score 27/40 Complains of nasal stuffiness supine. Failed nasal strips. Sleeps sitting up because of this and uses saline nasal rinse. 6 minute walk test 07/16/2012: 95%, 97%, 98%, 216 m-ended test labored  for breath and fatigue but without desaturation. CXR 06/10/12 reviewed with them-bronchitis changes, CE/ASVD/CABG IMPRESSION:  1. Diffuse peribronchial cuffing. Given the patient's history of  prior smoking, this may suggest chronic bronchitis.  2. In addition, there is mild cardiomegaly with pulmonary venous  congestion, but no frank pulmonary edema at this time.  3. Status post median sternotomy for CABG.  4. Atherosclerosis.  Original Report Authenticated By: Etheleen Mayhew, M.D.  PFT: 07/16/2012-moderate to severe obstructive airways disease with air trapping, diffusion mildly reduced. FEV1/FVC 0.49. Insignificant response to bronchodilator. RV 128%, DLCO 69%.   12/20/12-  70 yoM former smoker followed for dyspnea/ COPD, complicated by hx OSA/ Dr Claiborne Billings, CAD/ CHF/ MI/ AICD     Wife here FOLLOWS FOR: breathing is unchanged since last OV. denies chest pain, chest tightness or coughing. reports increased SOB with extertion. Probably no change in dyspnea with exertion when he thinks in terms of the impact on ADLs. He says he joined weight watchers last night and plans to lose 100 pounds. His cardiologist has increased his diuretic. No routine cough, phlegm or wheeze. CXR 06/10/12 IMPRESSION:  1. Diffuse peribronchial cuffing. Given the patient's history of  prior smoking, this may suggest chronic bronchitis.  2. In addition, there is mild cardiomegaly with pulmonary venous  congestion, but no frank pulmonary edema at this time.  3. Status post median sternotomy for CABG.  4. Atherosclerosis.  Original Report Authenticated By: Etheleen Mayhew, M.D.  03/11/14- 70 yoM former smoker followed for dyspnea/ COPD, complicated by hx OSA/ Dr Claiborne Billings, CAD/ CHF/ MI/ AICD     Wife here Presents with SOB with any  exertion, chest congestion, nonprod cough.  Cardiologist recommended this visit yesterday.  Wears cpap [facility] nightly, 12 hours/night as well as during day.  Using rescue inhaler and  Spiriva. Has not qualified for home oxygen. 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 In early March and rhonchi this with green sputum. Office spirometry 03/11/14- weak/submaximal effort. By the numbers-severe obstructive airways disease and restriction of exhaled volume. FVC 2.49/59%, FEV1 1.29/89%, FEV1/FEC 0.5 to, FEF 25-75% 0.82/27%. CXR 03/02/14 IMPRESSION:  Improved lung aeration with resolving edema and atelectasis. No  pleural effusion.  Electronically Signed  By: Kalman Jewels M.D.  On: 03/02/2014 09:59  05/12/14-  FOLLOWS FOR: Wears CPAP/ [facility]/managed by Dr Claiborne Billings, every night for about 8-10 hours; DME is [facility]. Machine 70 yrs old. Patient would like me to manage CPAP/ sleep. Breathing well with nebs. Easy DOE. No cough on lisinopril.  03/04/15- 70 yoM former smoker followed for dyspnea/ COPD, complicated by hx OSA/ Dr Claiborne Billings, CAD/ CHF/ MI/ AICD/CM/ AAA     Wife here  CPAP/ [facility]/managed here FOLLOWS FOR wear CPAP every night for 8-10 hours. DME is [facility]. C/O increased SOB especially with movement  Uncertain pressure. He and wife both report that he uses CPAP all night and anytime he naps. Machine is now quite old. He questions if another setting would be better. CT abdomen from 02/19/2015 showed incidental right upper lobe 1.3 cm groundglass density with follow-up chest CT scheduled for 03/19/2015. Home oxygen saturations running 96-99% even though he feels short of breath with exertion he uses nebulizer or metered inhaler 3 or 4 times daily, for breathlessness without much wheezing. Walk test 03/04/15- did not desaturate  07/05/15- 70 yoM former smoker followed for dyspnea/ COPD, complicated by hx OSA, CAD/ CHF/ MI/ AICD/CM/ AAA, lung nodule     Wife here FOLLOWS FOR: DME-[facility], cpap auto is working fine, wife states he loves it, also patient noticed tremendous improvement since starting  Entresto (sacubitril-  valsartan)  Questions if CPAP pressure could be a little higher.  Asks jury duty letter- too short of breath to climb steps. CT showed RUL nodule- Dr Hendrickson/ [facility] has scheduled f/u CT. CT chest 03/19/15 IMPRESSION: 1. Short interval stability of a solitary, partially solid and sub-solid right upper lung lesion. The solid component is greater than 5 mm. Still, persistence for 3 months has yet to be confirmed by CT. Recommend thoracic surgery consultation and/or repeat chest CT in 3 months. 2. Centrilobular emphysema. No mediastinal lymphadenopathy. Electronically Signed  By: Genevie Ann M.D.  On: 03/19/2015 10:57  01/11/2016-70 year old male former smoker followed for dyspnea/COPD, complicated by history OSA, CAD/CHF/MI/AICD/CM/AAA, lung nodule ACUTE Wife states pt does not have enough air to do anything even with O2 levels being normal; Severe SOB and fatigued-feels as though he is smothering-takes about 20 minutes to get through this.  Cough-productive-green for aobut 4-5 months and getting worse. Denies any calf pain. No fever, sore throat or obvious acute infection but sputum is been discolored and cough is annoying so he would like to try an antibiotic. Last visit to CHF clinic 1 month ago with pending cardiology appointment. Using nebulizer 3 or 4 times daily with transient benefit-helps him cough up phlegm. No blood, mostly white but with some yellow or green. Also using pro-air rescue inhaler 2 or 3 times daily. CT chest 10/19/2015 IMPRESSION: 1. The previously described subsolid nodule in the right upper lobe is very similar  to prior studies, measuring 2.3 x 0.8 cm on today's examination, with a tiny 2 mm central solid component. This lesion is nonspecific, and given the intimate association with some mildly bronchiectatic bronchi, is favored to represent an area of chronic post infectious or inflammatory scarring. However, repeat noncontrast chest CT is recommended in 12  months to ensure continued stability. This recommendation follows the consensus statement: Recommendations for the Management of Subsolid Pulmonary Nodules Detected at CT: A Statement from the Fleischner Society as published in Radiology 2013; 266:304-317. 2. Atherosclerosis, including left main and 3 vessel coronary artery disease. Status post median sternotomy for CABG. 3. Mild diffuse bronchial wall thickening with mild centrilobular and paraseptal emphysema ; imaging findings suggestive of underlying COPD. 4. Additional incidental findings, as above. CXR 11/30/2015 IMPRESSION: 1. AICD in good anatomic position. 2. Findings suggesting mild congestive heart failure with mild pulmonary interstitial edema .  03/30/2016-70 year old male former smoker followed for dyspnea/COPD, lung nodule, OSA complicated by history , CAD/CHF/MI/AICD/CM/AAA, morbid obesity CPAP auto/[facility] ACUTE VISIT: Went to Cardiologist and was told that SOB is not related to his heart-needed to f/u here. Pt states he has hard time throughout entire day with SOB and wheezing-uses CPAP machine. Has to force air and out to help. Unable to have good quality of life due to breathing. Known severe obstructive airways disease from previous PFT. Wheeze and cough on waking in the morning. Dyspnea with exertion and sometimes while lying supine. Dieting for weight loss. CXR 02/02/2016 IMPRESSION: COPD-reactive airway disease. Mild stable cardiomegaly with central pulmonary vascular prominence may reflect low-grade compensated CHF. There is no alveolar pneumonia. Electronically Signed  By: David Martinique M.D.  On: 02/02/2016 13:39  ROS-see HPI Constitutional:  +  weight loss, night sweats, fevers, chills, fatigue, lassitude. HEENT:   No-  headaches, difficulty swallowing, tooth/dental problems, sore throat,       No-  sneezing, itching, ear ache, nasal congestion, post nasal drip,  CV:  No-   chest pain,  +orthopnea, PND, +swelling in lower extremities, No-anasarca, dizziness, palpitations Resp: + shortness of breath with exertion or at rest.              No-   productive cough,  No non-productive cough,  No- coughing up of blood.              No-   change in color of mucus.  No- wheezing.   Skin: No-   rash or lesions. GI:  No-   heartburn, indigestion, abdominal pain, nausea, vomiting, GU: . MS:  + joint pain or swelling.  Neuro-     nothing unusual Psych:  No- change in mood or affect. No depression or anxiety.  No memory loss.  OBJ- Physical Exam    General- Alert, Oriented, Affect-appropriate, Distress- none acute, +obese, talkative but short sentences Skin- clear Lymphadenopathy- none Head- atraumatic            Eyes- Gross vision intact, PERRLA, conjunctivae and secretions clear            Ears- Hearing, canals-normal            Nose- +turbinate edema, no-Septal dev, mucus, polyps, erosion, perforation             Throat- Mallampati III, mucosa clear , drainage- none, tonsils- atrophic Neck- flexible , trachea midline, no stridor , thyroid nl, carotid no bruit Chest - symmetrical excursion , unlabored           Heart/CV- RRR ,  no murmur , no gallop  , no rub, nl s1 s2                           - JVD- none , edema -none, stasis changes- none, varices- none           Lung- clear to P&A shallow inspiratory effort, rales- none, wheeze+, cough- none ,  dullness-none, rub- none,            Chest wall- sternotomy scar,  Pacemaker defibrillator L Abd-  Br/ Gen/ Rectal- Not done, not indicated Extrem- cyanosis- none, clubbing, none, atrophy- none, strength- nl, vein donor scars Neuro- head bob tremor

## 2016-03-30 NOTE — Patient Instructions (Signed)
Order- walk test on room air     Dx COPD mixed type  Order- overnight oximetry on room air with CPAP  Order- schedule PFT  Sample x 2 Bevespi inhaler         Inhale 2 puffs, twice daily maintenance. You can still use your rescue inhaler or your nebulizer if needed  Continue CPAP-  My staff will order a down load for pressure compliance from [facility] Patient

## 2016-03-30 NOTE — Assessment & Plan Note (Signed)
Dyspnea on exertion reflects both lung and heart disease. We will update pulmonary function status, assess oxygen need and try to bronchodilator. Plan-walk test and overnight oximetry. Try samples of Bevespi

## 2016-03-30 NOTE — Assessment & Plan Note (Signed)
Compliance and control have been good with CPAP with no change needed at this visit

## 2016-04-06 ENCOUNTER — Encounter: Payer: Self-pay | Admitting: Internal Medicine

## 2016-04-10 DIAGNOSIS — G4733 Obstructive sleep apnea (adult) (pediatric): Secondary | ICD-10-CM | POA: Diagnosis not present

## 2016-04-10 DIAGNOSIS — J449 Chronic obstructive pulmonary disease, unspecified: Secondary | ICD-10-CM | POA: Diagnosis not present

## 2016-04-12 ENCOUNTER — Encounter: Payer: Self-pay | Admitting: Internal Medicine

## 2016-04-14 ENCOUNTER — Ambulatory Visit (HOSPITAL_COMMUNITY)
Admission: RE | Admit: 2016-04-14 | Discharge: 2016-04-14 | Disposition: A | Discharge status: Home / Self Care | Payer: Medicare Other | Source: Ambulatory Visit | Attending: Internal Medicine | Admitting: Internal Medicine

## 2016-04-14 DIAGNOSIS — R29898 Other symptoms and signs involving the musculoskeletal system: Secondary | ICD-10-CM | POA: Diagnosis not present

## 2016-04-14 DIAGNOSIS — E785 Hyperlipidemia, unspecified: Secondary | ICD-10-CM | POA: Diagnosis not present

## 2016-04-14 DIAGNOSIS — I5042 Chronic combined systolic (congestive) and diastolic (congestive) heart failure: Secondary | ICD-10-CM | POA: Diagnosis not present

## 2016-04-14 DIAGNOSIS — I739 Peripheral vascular disease, unspecified: Secondary | ICD-10-CM | POA: Insufficient documentation

## 2016-04-14 DIAGNOSIS — E119 Type 2 diabetes mellitus without complications: Secondary | ICD-10-CM | POA: Diagnosis not present

## 2016-04-14 DIAGNOSIS — I2510 Atherosclerotic heart disease of native coronary artery without angina pectoris: Secondary | ICD-10-CM | POA: Diagnosis not present

## 2016-04-14 DIAGNOSIS — I255 Ischemic cardiomyopathy: Secondary | ICD-10-CM | POA: Diagnosis not present

## 2016-04-14 DIAGNOSIS — I340 Nonrheumatic mitral (valve) insufficiency: Secondary | ICD-10-CM | POA: Diagnosis not present

## 2016-04-14 DIAGNOSIS — I358 Other nonrheumatic aortic valve disorders: Secondary | ICD-10-CM | POA: Insufficient documentation

## 2016-04-14 DIAGNOSIS — I071 Rheumatic tricuspid insufficiency: Secondary | ICD-10-CM | POA: Insufficient documentation

## 2016-04-14 DIAGNOSIS — I110 Hypertensive heart disease with heart failure: Secondary | ICD-10-CM | POA: Diagnosis not present

## 2016-04-14 NOTE — Progress Notes (Signed)
  Echocardiogram 2D Echocardiogram has been performed.  [name] M 04/14/2016, 2:40 PM

## 2016-05-02 ENCOUNTER — Ambulatory Visit (INDEPENDENT_AMBULATORY_CARE_PROVIDER_SITE_OTHER): Payer: Medicare Other | Admitting: Thoracic Surgery (Cardiothoracic Vascular Surgery)

## 2016-05-02 ENCOUNTER — Ambulatory Visit
Admission: RE | Admit: 2016-05-02 | Discharge: 2016-05-02 | Disposition: A | Discharge status: Home / Self Care | Payer: Medicare Other | Source: Ambulatory Visit | Attending: Thoracic Surgery (Cardiothoracic Vascular Surgery) | Admitting: Thoracic Surgery (Cardiothoracic Vascular Surgery)

## 2016-05-02 ENCOUNTER — Encounter: Payer: Self-pay | Admitting: Thoracic Surgery (Cardiothoracic Vascular Surgery)

## 2016-05-02 VITALS — BP 129/70 | HR 60 | Resp 20 | Ht 68.0 in | Wt 255.0 lb

## 2016-05-02 DIAGNOSIS — I255 Ischemic cardiomyopathy: Secondary | ICD-10-CM | POA: Diagnosis not present

## 2016-05-02 DIAGNOSIS — R911 Solitary pulmonary nodule: Secondary | ICD-10-CM

## 2016-05-02 DIAGNOSIS — R918 Other nonspecific abnormal finding of lung field: Secondary | ICD-10-CM | POA: Diagnosis not present

## 2016-05-02 NOTE — Progress Notes (Signed)
[address]       [place] [address]             [phone]       HPI: [name] returns today for follow-up regarding a right upper lobe abnormality.  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 His PMH is complicated and includes CHF, CAD, s/ CABG, s/p multiple stents, VT, ICD, PVD, hypertension, hyperlipidemia, severe COPD, morbid obesity, sleep apnea, and an AAA. In May 2016 he had a CT for follow up of an aortic endograft. There was an incidentally noted ground glass nodule centrally in the right upper lobe. A dedicated CT of the chest again showed the nodule, there was no hilar or mediastinal adenopathy.   I saw him in May 2016 recommended a follow-up scan in 3 months. We have been following since that time with serial CT scans.  He continues to have difficulty with shortness of breath and wheezing. He has both COPD and CHF, so it can be difficult to tell which is responsible. His CHF seems to be the most limiting factor. He gets SOB with walking < 100 feet. He has started exercising and has lost 16 pounds since his last visit. That was after losing 10 pounds in 3 months prior to the last visit. He had questions about whether Entresto could be aggravating his respiratory symptoms.  He has not had any unusual cough, hemoptysis, fever, chills, sweats or weight loss.  Past Medical History  Diagnosis Date  . Myocardial infarction ([place])   . Hypertension   . CHF (congestive heart failure) ([place])   . COPD (chronic obstructive pulmonary disease) ([place])   . GERD (gastroesophageal reflux disease)   . AAA (abdominal aortic aneurysm) ([place])     followed by Dr. Bridgett Larsson  . Tremor   . Dysrhythmia     ICD-defibrillator  . Carotid artery stenosis     LCEA - Dr. Bridgett Larsson in 2013  . Peripheral vascular disease ([facility])     LCEA, L renal artery stent, bilat iliac stents, R SFA stenosis  . Adenomatous colon polyp 01/2004  . Diverticulosis   . AVM  (arteriovenous malformation)   . CAD (coronary artery disease)     s/p CABGx2 in 1996  . HLD (hyperlipidemia)   . Hypothyroidism   . BPH (benign prostatic hypertrophy)   . Fatigue   . Automatic implantable cardioverter-defibrillator in situ   . OSA on CPAP     AHI durign total sleep 14.69/hr, during REM 50.91/hr  . H/O hiatal hernia   . Anemia     hx  . Complication of anesthesia     claustrophobic, unabe to lie on back more than 4 hours at time due to back  . Ischemic cardiomyopathy   . Ventricular tachycardia ([place]) 09/09/2014    Amiodarone was started after appropriate defibrillator shocks for ventricular tachycardia in October 2008  . Diabetes mellitus     DIET CONTROLLED- pt states that this was a misdiagnosis, he was treated while in the hospital  but returned home, loss a massive amount of weight and he has not had a problem with his blood sugar since. States everything has been normal for about 3 years.     Current Outpatient Prescriptions  Medication Sig Dispense Refill  . albuterol (PROVENTIL HFA;VENTOLIN HFA) 108 (90 BASE) MCG/ACT inhaler Inhale 2 puffs into the lungs every 6 (six) hours as needed for wheezing  or shortness of breath. 3 Inhaler 4  . albuterol (PROVENTIL) (2.5 MG/3ML) 0.083% nebulizer solution Take 3 mLs (2.5 mg total) by nebulization every 6 (six) hours. 360 mL 6  . amiodarone (PACERONE) 200 MG tablet Take 1 tablet by mouth  daily 90 tablet 3  . aspirin 81 MG tablet Take 81 mg by mouth daily.    Marland Kitchen atorvastatin (LIPITOR) 40 MG tablet Take 1 tablet by mouth  daily 90 tablet 3  . carvedilol (COREG) 25 MG tablet Take 1 tablet by mouth  twice a day with meals 180 tablet 3  . clopidogrel (PLAVIX) 75 MG tablet Take 1 tablet by mouth  daily 90 tablet 3  . finasteride (PROSCAR) 5 MG tablet Take 5 mg by mouth daily.    . fluticasone (FLONASE) 50 MCG/ACT nasal spray Instill 1 spray in each  nostril daily 16 g 3  . furosemide (LASIX) 80 MG tablet Take 1 tablet by mouth  two  times daily 180 tablet 3  . Glycopyrrolate-Formoterol (BEVESPI AEROSPHERE) 9-4.8 MCG/ACT AERO Inhale 2 puffs into the lungs 2 (two) times daily. 2 Inhaler 0  . ipratropium (ATROVENT HFA) 17 MCG/ACT inhaler Inhale 2 puffs into the lungs every 4 (four) hours as needed for wheezing. 1 Inhaler 12  . isosorbide mononitrate (IMDUR) 60 MG 24 hr tablet Take 1 tablet by mouth  daily 90 tablet 3  . levothyroxine (SYNTHROID, LEVOTHROID) 50 MCG tablet Take 1 tablet by mouth  daily before breakfast 90 tablet 0  . Multiple Vitamins-Minerals (MENS 50+ MULTI VITAMIN/MIN) TABS Take 1 tablet by mouth daily.     . multivitamin-lutein (OCUVITE-LUTEIN) CAPS Take 1 capsule by mouth daily.      Marland Kitchen NITROSTAT 0.4 MG SL tablet Dissolve one tablet under  the tongue every 5 minutes  as needed for chest pain  (up to 3 tabs in 15 minutes then call 911) 25 tablet 6  . NON FORMULARY at bedtime. CPAP And during the day as needed    . sacubitril-valsartan (ENTRESTO) 49-51 MG Take 1 tablet by mouth 2 (two) times daily. 60 tablet 6  . spironolactone (ALDACTONE) 25 MG tablet Take 1 tablet by mouth  daily 90 tablet 3   Current Facility-Administered Medications  Medication Dose Route Frequency Provider Last Rate Last Dose  . ipratropium-albuterol (DUONEB) 0.5-2.5 (3) MG/3ML nebulizer solution 3 mL  3 mL Nebulization Once [name], MD        Physical Exam BP 129/70 mmHg  Pulse 60  Resp 20  Ht '5\' 8"'$  (1.727 m)  Wt 255 lb (115.667 kg)  BMI 38.78 kg/m2  SpO2 96% Morbidly obese 70 year old man in no acute distress Alert and oriented 3 No cervical or subclavicular adenopathy Cardiac: 1 to 2/6 systolic murmur Lungs diminished breath sounds bilaterally, faint wheezing- left greater than right.  Diagnostic Tests: CT CHEST WITHOUT CONTRAST  TECHNIQUE: Multidetector CT imaging of the chest was performed following the standard protocol without IV contrast.  COMPARISON: Chest x-ray of 02/02/2016, and CT of the chest  of 10/19/2015 and 03/19/2015  FINDINGS: On lung window images, changes of centrilobular emphysema again are noted. The ground-glass opacity within the anterior right upper lobe has not changed significantly in size or configuration when compared to the CT from 03/19/2015. There is however of 5 mm nodular opacity just anterior to this ground-glass opacity which is not seen previously. Continued followup is recommended with a CT the chest in 4-6 months. No additional lung lesion is seen. No parenchymal infiltrate  is noted and there is no evidence of pleural effusion. The central airway is patent. Median sternotomy sutures are noted from CABG.  On soft tissue window images, a battery pack is imbedded in the soft tissues of the left upper chest anteriorly. On this unenhanced study, no mediastinal or hilar adenopathy is seen. Heart size is stable. No pericardial effusion is seen. No abnormality is noted within the upper abdomen. The thoracic vertebrae are in normal alignment with mild degenerative change present diffusely.  IMPRESSION: 1. No significant change in the small ground-glass opacity within the anterior right upper lobe. 2. However, there is a 5 mm nodular opacity just anterior to this ground-glass opacity which is not seen previously. Continued followup is recommended with a CT of the chest in 4-6 months to assess stability. 3. Centrilobular emphysema.   Electronically Signed  By: Ivar Drape M.D.  On: 05/02/2016 14:37  I personally reviewed the CT chest and concur with the findings above. I do think there is a question of whether this is a new 5 mm nodule or whether there is just a slightly different cut on the axial windows.  Impression: [name] is a 70 year old man with a history of tobacco abuse and COPD. He has a linear groundglass opacity in the right upper lobe. This has been stable over the past year. There is a question by radiology of a new 5 mm nodular  opacity just anterior to the groundglass opacity. I'm not sure whether that is really new or whether it is just a function of a slightly different cut on the CT. In any event is not large enough to warrant any intervention at this point does bear continued follow-up, which he needs any way.  I recommended that he return in 6 months with a CT of chest follow-up the right upper lobe groundglass opacity.  COPD- despite some significant limitations he has been exercising and losing weight.  Plan: Return in 6 months with CT chest  [name], MD [facility] [phone]

## 2016-05-03 ENCOUNTER — Ambulatory Visit (HOSPITAL_COMMUNITY)
Admission: RE | Admit: 2016-05-03 | Discharge: 2016-05-03 | Disposition: A | Discharge status: Home / Self Care | Payer: Medicare Other | Source: Ambulatory Visit | Attending: Cardiology | Admitting: Cardiology

## 2016-05-03 ENCOUNTER — Ambulatory Visit (INDEPENDENT_AMBULATORY_CARE_PROVIDER_SITE_OTHER): Payer: Medicare Other | Admitting: *Deleted

## 2016-05-03 VITALS — BP 150/76 | HR 68 | Wt 247.5 lb

## 2016-05-03 DIAGNOSIS — I255 Ischemic cardiomyopathy: Secondary | ICD-10-CM | POA: Insufficient documentation

## 2016-05-03 DIAGNOSIS — I252 Old myocardial infarction: Secondary | ICD-10-CM | POA: Diagnosis not present

## 2016-05-03 DIAGNOSIS — I5022 Chronic systolic (congestive) heart failure: Secondary | ICD-10-CM | POA: Insufficient documentation

## 2016-05-03 DIAGNOSIS — I2510 Atherosclerotic heart disease of native coronary artery without angina pectoris: Secondary | ICD-10-CM | POA: Diagnosis not present

## 2016-05-03 DIAGNOSIS — R911 Solitary pulmonary nodule: Secondary | ICD-10-CM | POA: Insufficient documentation

## 2016-05-03 DIAGNOSIS — Z95810 Presence of automatic (implantable) cardiac defibrillator: Secondary | ICD-10-CM | POA: Diagnosis not present

## 2016-05-03 DIAGNOSIS — Z888 Allergy status to other drugs, medicaments and biological substances status: Secondary | ICD-10-CM | POA: Diagnosis not present

## 2016-05-03 DIAGNOSIS — Z7982 Long term (current) use of aspirin: Secondary | ICD-10-CM | POA: Insufficient documentation

## 2016-05-03 DIAGNOSIS — K219 Gastro-esophageal reflux disease without esophagitis: Secondary | ICD-10-CM | POA: Diagnosis not present

## 2016-05-03 DIAGNOSIS — E785 Hyperlipidemia, unspecified: Secondary | ICD-10-CM | POA: Diagnosis not present

## 2016-05-03 DIAGNOSIS — J441 Chronic obstructive pulmonary disease with (acute) exacerbation: Secondary | ICD-10-CM | POA: Diagnosis not present

## 2016-05-03 DIAGNOSIS — E119 Type 2 diabetes mellitus without complications: Secondary | ICD-10-CM | POA: Insufficient documentation

## 2016-05-03 DIAGNOSIS — Z79899 Other long term (current) drug therapy: Secondary | ICD-10-CM | POA: Diagnosis not present

## 2016-05-03 DIAGNOSIS — I10 Essential (primary) hypertension: Secondary | ICD-10-CM | POA: Insufficient documentation

## 2016-05-03 DIAGNOSIS — Z87891 Personal history of nicotine dependence: Secondary | ICD-10-CM | POA: Diagnosis not present

## 2016-05-03 DIAGNOSIS — I5042 Chronic combined systolic (congestive) and diastolic (congestive) heart failure: Secondary | ICD-10-CM

## 2016-05-03 DIAGNOSIS — G4733 Obstructive sleep apnea (adult) (pediatric): Secondary | ICD-10-CM | POA: Diagnosis not present

## 2016-05-03 DIAGNOSIS — E039 Hypothyroidism, unspecified: Secondary | ICD-10-CM | POA: Insufficient documentation

## 2016-05-03 DIAGNOSIS — N400 Enlarged prostate without lower urinary tract symptoms: Secondary | ICD-10-CM | POA: Diagnosis not present

## 2016-05-03 DIAGNOSIS — K449 Diaphragmatic hernia without obstruction or gangrene: Secondary | ICD-10-CM | POA: Insufficient documentation

## 2016-05-03 DIAGNOSIS — Z951 Presence of aortocoronary bypass graft: Secondary | ICD-10-CM

## 2016-05-03 DIAGNOSIS — I739 Peripheral vascular disease, unspecified: Secondary | ICD-10-CM | POA: Diagnosis not present

## 2016-05-03 DIAGNOSIS — I714 Abdominal aortic aneurysm, without rupture: Secondary | ICD-10-CM | POA: Insufficient documentation

## 2016-05-03 LAB — COMPREHENSIVE METABOLIC PANEL
ALT: 21 U/L (ref 17–63)
AST: 21 U/L (ref 15–41)
Albumin: 4.2 g/dL (ref 3.5–5.0)
Alkaline Phosphatase: 77 U/L (ref 38–126)
Anion gap: 9 (ref 5–15)
BUN: 17 mg/dL (ref 6–20)
CO2: 25 mmol/L (ref 22–32)
Calcium: 9.3 mg/dL (ref 8.9–10.3)
Chloride: 104 mmol/L (ref 101–111)
Creatinine, Ser: 0.93 mg/dL (ref 0.61–1.24)
GFR calc Af Amer: >60 mL/min (ref >60)
GFR calc non Af Amer: >60 mL/min (ref >60)
Glucose, Bld: 87 mg/dL (ref 65–99)
Potassium: 3.8 mmol/L (ref 3.5–5.1)
Sodium: 138 mmol/L (ref 135–145)
Total Bilirubin: 1 mg/dL (ref 0.3–1.2)
Total Protein: 6.9 g/dL (ref 6.5–8.1)

## 2016-05-03 LAB — BRAIN NATRIURETIC PEPTIDE: B Natriuretic Peptide: 61.3 pg/mL (ref 0.0–100.0)

## 2016-05-03 LAB — TSH: TSH: 2.847 u[IU]/mL (ref 0.350–4.500)

## 2016-05-03 MED ORDER — SACUBITRIL-VALSARTAN 97-103 MG PO TABS: 1.0000 | ORAL_TABLET | Freq: Two times a day (BID) | ORAL | Status: DC

## 2016-05-03 NOTE — Progress Notes (Signed)
Remote ICD transmission.   

## 2016-05-03 NOTE — Patient Instructions (Signed)
Increase Entresto to 97/103 mg Twice daily   Labs today  Labs in 10 days  Your physician recommends that you schedule a follow-up appointment in: 3 months

## 2016-05-04 NOTE — Progress Notes (Signed)
Patient ID: [name], male   DOB: 09-25-1946, 70 y.o.   MRN: [id number] [facility]  05/04/2016 [name]   1946-11-14  [id number]  Primary Physicia [name], MD Hematologist: Dr Alen Blew  Pulmonary: Dr Annamaria Boots Cardiology: [name]  HPI:  [name] is a pleasant 70 yo with a past medical history significant for morbid obesity, systolic heart failure with an EF of 30-35% (January 2014) -> 20-25% (March 2015 - RV normal), VT on amio, CAD s/p CABG 1996, LBBB, COPD, obstructive sleep apnea on CPAP, AAA repair (January 2015) who was admitted to [facility] on 02/24/14 for acute on chronic HF and acute COPD exacerbation. He also had iron deficiency anemia.   Admitted to [facility] July 24 through July 06 2014 with chest pain.  CEs negative. Had a LHC with no change from previous LHC with recommendations to continue medical management. Discharge weight was 255 pounds.   LHC 07/06/14 --No significant change from previous studies.  Left anterior descending (LAD): The LAD is a large vessel. There is a 40% stenosis immediately after the takeoff of a large septal perforator. There are 2 large diagonal branches without significant disease. Left circumflex (LCx): The LCx is occluded proximally. There are left to left collaterals.  Right coronary artery (RCA): The RCA is occluded proximally immediately following the conus branch. There are right to right and left to right collaterals. SVGs from CABG known to be totally occluded.   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   CTA chest/abd/pelvis (3/16) with moderate emphysema, 1.3 cm nodule RUL, left SFA totally occluded, right SFA with significant stenosis, s/p endovascular AAA repair (stable).   Echo (5/17) with EF 20-25%, severe LV dilation, mild MR, normal RV size with mildly decreased systolic function.   He returns for follow up.  He seems to be doing well.  No wheezing.  No orthopnea/PND.  Walking 2000+ steps/day by his Fitbit.  No  dyspnea walking around 100 yards on flat ground.  Dyspnea with incline.  No chest pain.  Walking for exercise in his church gym.  No claudication.  Using CPAP.  Weight is down 9 lbs.   Labs 3/15 Cr 1.5 K 5.6 Labs 07/06/14 K 3.9 Creatinine  0.98 Labs 9/15 K 5.1, creatinine 0.96 Labs 11/15 K 3.8, creatinine 0.83, LDL 61, LFTs normal, TSH normal Labs 11/26/14 K 5.0 Creatinine 0.73  Labs 12/08/14 K 3.4 Creatinine 0.83  Labs 3/16 K 4.4, creatinine 1.03, LFTs normal, TSH normal, HCT 37.8, BNP 65 Labs 6/16 K 4.1, creatinine 0.93, TSH normal, LFTs normal Labs 11/16 K 5.1, creatinine 1.13, LFTs normal, TSH normal, LDL 83, HDL 33, TGs 162 Labs 1/17 K 4.4, creatinine 0.86, pro-BNP 154 Labs 2/17 BNP 113, K 3.8, creatinine 0.79, TSH normal, LFTs normal Labs 4/17 K 3.8, creatinine 0.91, HCT 48.1  Past Medical History  Diagnosis Date  . Myocardial infarction ([place])   . Hypertension   . CHF (congestive heart failure) ([place])   . COPD (chronic obstructive pulmonary disease) ([place])   . GERD (gastroesophageal reflux disease)   . AAA (abdominal aortic aneurysm) ([place])     followed by Dr. Bridgett Larsson  . Tremor   . Dysrhythmia     ICD-defibrillator  . Carotid artery stenosis     LCEA - Dr. Bridgett Larsson in 2013  . Peripheral vascular disease (H[facility])     LCEA, L renal artery stent, bilat iliac stents, R SFA stenosis  . Adenomatous colon polyp 01/2004  . Diverticulosis   .  AVM (arteriovenous malformation)   . CAD (coronary artery disease)     s/p CABGx2 in 1996  . HLD (hyperlipidemia)   . Hypothyroidism   . BPH (benign prostatic hypertrophy)   . Fatigue   . Automatic implantable cardioverter-defibrillator in situ   . OSA on CPAP     AHI durign total sleep 14.69/hr, during REM 50.91/hr  . H/O hiatal hernia   . Anemia     hx  . Complication of anesthesia     claustrophobic, unabe to lie on back more than 4 hours at time due to back  . Ischemic cardiomyopathy   . Ventricular tachycardia ([place]) 09/09/2014    Amiodarone  was started after appropriate defibrillator shocks for ventricular tachycardia in October 2008  . Diabetes mellitus     DIET CONTROLLED- pt states that this was a misdiagnosis, he was treated while in the hospital  but returned home, loss a massive amount of weight and he has not had a problem with his blood sugar since. States everything has been normal for about 3 years.    Current Outpatient Prescriptions  Medication Sig Dispense Refill  . albuterol (PROVENTIL HFA;VENTOLIN HFA) 108 (90 BASE) MCG/ACT inhaler Inhale 2 puffs into the lungs every 6 (six) hours as needed for wheezing or shortness of breath. 3 Inhaler 4  . albuterol (PROVENTIL) (2.5 MG/3ML) 0.083% nebulizer solution Take 3 mLs (2.5 mg total) by nebulization every 6 (six) hours. 360 mL 6  . amiodarone (PACERONE) 200 MG tablet Take 1 tablet by mouth  daily 90 tablet 3  . aspirin 81 MG tablet Take 81 mg by mouth daily.    Marland Kitchen atorvastatin (LIPITOR) 40 MG tablet Take 1 tablet by mouth  daily 90 tablet 3  . carvedilol (COREG) 25 MG tablet Take 1 tablet by mouth  twice a day with meals 180 tablet 3  . clopidogrel (PLAVIX) 75 MG tablet Take 1 tablet by mouth  daily 90 tablet 3  . finasteride (PROSCAR) 5 MG tablet Take 5 mg by mouth daily.    . fluticasone (FLONASE) 50 MCG/ACT nasal spray Instill 1 spray in each  nostril daily 16 g 3  . furosemide (LASIX) 80 MG tablet Take 1 tablet by mouth two  times daily 180 tablet 3  . Glycopyrrolate-Formoterol (BEVESPI AEROSPHERE) 9-4.8 MCG/ACT AERO Inhale 2 puffs into the lungs 2 (two) times daily. 2 Inhaler 0  . ipratropium (ATROVENT HFA) 17 MCG/ACT inhaler Inhale 2 puffs into the lungs every 4 (four) hours as needed for wheezing. 1 Inhaler 12  . isosorbide mononitrate (IMDUR) 60 MG 24 hr tablet Take 1 tablet by mouth  daily 90 tablet 3  . levothyroxine (SYNTHROID, LEVOTHROID) 50 MCG tablet Take 1 tablet by mouth  daily before breakfast 90 tablet 0  . Multiple Vitamins-Minerals (MENS 50+ MULTI  VITAMIN/MIN) TABS Take 1 tablet by mouth daily.     . multivitamin-lutein (OCUVITE-LUTEIN) CAPS Take 1 capsule by mouth daily.      Marland Kitchen NITROSTAT 0.4 MG SL tablet Dissolve one tablet under  the tongue every 5 minutes  as needed for chest pain  (up to 3 tabs in 15 minutes then call 911) 25 tablet 6  . NON FORMULARY at bedtime. CPAP And during the day as needed    . spironolactone (ALDACTONE) 25 MG tablet Take 1 tablet by mouth  daily 90 tablet 3  . sacubitril-valsartan (ENTRESTO) 97-103 MG Take 1 tablet by mouth 2 (two) times daily. 60 tablet 6   Current  Facility-Administered Medications  Medication Dose Route Frequency Provider Last Rate Last Dose  . ipratropium-albuterol (DUONEB) 0.5-2.5 (3) MG/3ML nebulizer solution 3 mL  3 mL Nebulization Once [name], MD        Allergies  Allergen Reactions  . Tussionex Pennkinetic Er [Hydrocod Polst-Cpm Polst Er] Other (See Comments)    "caused prostate problems"  . Ace Inhibitors Other (See Comments)    REACTION: cough    Social History   Social History  . Marital Status: Married    Spouse Name: [name]  . Number of Children: 2  . Years of Education: [name]   Occupational History  . retired Solicitor     Social History Main Topics  . Smoking status: Former Smoker -- 1.50 packs/day for 40 years    Types: Cigarettes    Quit date: 12/11/2002  . Smokeless tobacco: Former Systems developer  . Alcohol Use: No  . Drug Use: No  . Sexual Activity: Not Currently   Other Topics Concern  . Not on file   Social History Narrative    Family History  Problem Relation Age of Onset  . Colon cancer Sister   . Cancer Sister   . Hypertension Sister   . Hyperlipidemia Sister   . Diabetes Sister   . Heart disease Sister     before age 30  . Heart disease Brother   . Lung cancer Mother   . Cancer Mother   . Diabetes Mother   . Hypertension Mother   . Hyperlipidemia Mother   . Heart disease Mother     before age 35  . Heart attack Maternal  Grandmother   . Colon polyps Sister   . Heart attack Father   . Heart disease Father     before age 36  . Hypertension Father   . COPD Sister   . Diabetes Daughter    Blood pressure 150/76, pulse 68, weight 247 lb 8 oz (112.265 kg), SpO2 92 %.  General appearance: alert, cooperative and no distress.  Neck: Thick.  No carotid bruit or JVP appreciated. L CEA scar Lungs: Distant breath sounds bilaterally.     Heart: regular rate and rhythm, S1, S2 normal, 1/6 SEM at RUSB.  No S3/S4. Unable to palpate pedal pulses.  Extremities:  No clubbing/cyanosis. 1+ ankle edema. Skin: warm and dry Neurologic: Grossly normal  ASSESSMENT/PLAN:  1. Chronic systolic CHF: Ischemic cardiomyopathy. ECHO 5/17 EF 20-25% with severe LV dilation.  [employer] CRT-D. NYHA class II symptoms.  Volume status looks ok today. - Continue lasix 80 mg twice a day.  Check BMET.  - Continue carvedilol 25 mg twice a day  - Increase Entresto to 97/103 bid.  BMET/BNP today and BMET again in 10 days.    - Continue spironolactone 25 mg daily.  2. CAD s/p CABG: No chest pain. Continue 81 mg aspirin daily and atorvastatin daily.  Good lipids when checked in 11/16. 3. VT s/p ICD: On amiodarone for history of ICD discharges due to VT.  LFTs/TSH to be checked today.  He should get yearly eye exams.  4. Morbid obesity: Continue to work on diet/exercise.   5. COPD: Moderate to severe. Has lung nodule, positionally not a great candidate for biopsy.  He had a recent COPD exacerbation.  Lungs now sound better.  - Pulmonary followup.  - Dr Roxan Hockey following nodule with serial CTs.  6. PAD: Occluded left SFA and significant right SFA stenosis on last CTA.  He does  not report claudication and does not have pedal ulcers.  Followed by [facility]. He is on statin.   Followup in 3 months.    [name] 05/04/2016

## 2016-05-15 ENCOUNTER — Ambulatory Visit (HOSPITAL_COMMUNITY)
Admission: RE | Admit: 2016-05-15 | Discharge: 2016-05-15 | Disposition: A | Discharge status: Home / Self Care | Payer: Medicare Other | Source: Ambulatory Visit | Attending: Internal Medicine | Admitting: Internal Medicine

## 2016-05-15 DIAGNOSIS — I5042 Chronic combined systolic (congestive) and diastolic (congestive) heart failure: Secondary | ICD-10-CM | POA: Diagnosis not present

## 2016-05-15 LAB — BASIC METABOLIC PANEL
Anion gap: 8 (ref 5–15)
BUN: 23 mg/dL — ABNORMAL HIGH (ref 6–20)
CO2: 30 mmol/L (ref 22–32)
Calcium: 8.9 mg/dL (ref 8.9–10.3)
Chloride: 103 mmol/L (ref 101–111)
Creatinine, Ser: 0.98 mg/dL (ref 0.61–1.24)
GFR calc Af Amer: >60 mL/min (ref >60)
GFR calc non Af Amer: >60 mL/min (ref >60)
Glucose, Bld: 113 mg/dL — ABNORMAL HIGH (ref 65–99)
Potassium: 3.5 mmol/L (ref 3.5–5.1)
Sodium: 141 mmol/L (ref 135–145)

## 2016-05-17 ENCOUNTER — Encounter: Payer: Self-pay | Admitting: Internal Medicine

## 2016-05-21 LAB — CUP PACEART REMOTE DEVICE CHECK
Battery Remaining Longevity: 96 mo
Battery Remaining Percentage: 100 %
Brady Statistic RA Percent Paced: 0 %
Brady Statistic RV Percent Paced: 85 %
Date Time Interrogation Session: 20170519064100
HighPow Impedance: 46 Ohm
Implantable Lead Implant Date: 20060411
Implantable Lead Implant Date: 20151030
Implantable Lead Implant Date: 20151030
Implantable Lead Location: 753858
Implantable Lead Location: 753859
Implantable Lead Location: 753860
Implantable Lead Model: 148
Implantable Lead Model: 4136
Implantable Lead Serial Number: 145070
Implantable Lead Serial Number: 29713415
Lead Channel Impedance Value: 549 Ohm
Lead Channel Impedance Value: 568 Ohm
Lead Channel Impedance Value: 642 Ohm
Lead Channel Pacing Threshold Amplitude: 0.7 V
Lead Channel Pacing Threshold Amplitude: 0.8 V
Lead Channel Pacing Threshold Amplitude: 2.5 V
Lead Channel Pacing Threshold Pulse Width: 0.4 ms
Lead Channel Pacing Threshold Pulse Width: 0.4 ms
Lead Channel Pacing Threshold Pulse Width: 0.9 ms
Lead Channel Setting Pacing Amplitude: 2 V
Lead Channel Setting Pacing Amplitude: 2 V
Lead Channel Setting Pacing Amplitude: 3.5 V
Lead Channel Setting Pacing Pulse Width: 0.4 ms
Lead Channel Setting Pacing Pulse Width: 0.9 ms
Lead Channel Setting Sensing Sensitivity: 0.6 mV
Lead Channel Setting Sensing Sensitivity: 1 mV
Pulse Gen Serial Number: 370874

## 2016-05-22 ENCOUNTER — Other Ambulatory Visit: Payer: Self-pay | Admitting: Internal Medicine

## 2016-05-30 ENCOUNTER — Encounter: Payer: Self-pay | Admitting: Cardiology

## 2016-06-01 ENCOUNTER — Other Ambulatory Visit: Payer: Self-pay | Admitting: Cardiovascular Disease

## 2016-06-01 ENCOUNTER — Other Ambulatory Visit: Payer: Self-pay | Admitting: Cardiology

## 2016-06-01 NOTE — Telephone Encounter (Signed)
Please advise 

## 2016-06-14 DIAGNOSIS — R351 Nocturia: Secondary | ICD-10-CM | POA: Diagnosis not present

## 2016-06-14 DIAGNOSIS — N401 Enlarged prostate with lower urinary tract symptoms: Secondary | ICD-10-CM | POA: Diagnosis not present

## 2016-06-14 DIAGNOSIS — N471 Phimosis: Secondary | ICD-10-CM | POA: Diagnosis not present

## 2016-06-20 ENCOUNTER — Other Ambulatory Visit (HOSPITAL_BASED_OUTPATIENT_CLINIC_OR_DEPARTMENT_OTHER): Payer: Medicare Other

## 2016-06-20 ENCOUNTER — Ambulatory Visit (HOSPITAL_BASED_OUTPATIENT_CLINIC_OR_DEPARTMENT_OTHER): Payer: Medicare Other | Admitting: Oncology

## 2016-06-20 ENCOUNTER — Telehealth: Payer: Self-pay | Admitting: Oncology

## 2016-06-20 VITALS — BP 100/59 | HR 63 | Temp 98.2°F | Resp 19 | Ht 68.0 in | Wt 235.7 lb

## 2016-06-20 DIAGNOSIS — I509 Heart failure, unspecified: Secondary | ICD-10-CM | POA: Diagnosis not present

## 2016-06-20 DIAGNOSIS — D509 Iron deficiency anemia, unspecified: Secondary | ICD-10-CM

## 2016-06-20 LAB — IRON AND TIBC
%SAT: 26 % (ref 20–55)
Iron: 66 ug/dL (ref 42–163)
TIBC: 260 ug/dL (ref 202–409)
UIBC: 194 ug/dL (ref 117–376)

## 2016-06-20 LAB — CBC WITH DIFFERENTIAL/PLATELET
BASO%: 0.5 % (ref 0.0–2.0)
Basophils Absolute: 0 10*3/uL (ref 0.0–0.1)
EOS%: 3.4 % (ref 0.0–7.0)
Eosinophils Absolute: 0.2 10*3/uL (ref 0.0–0.5)
HCT: 46.5 % (ref 38.4–49.9)
HGB: 15.2 g/dL (ref 13.0–17.1)
LYMPH%: 20.8 % (ref 14.0–49.0)
MCH: 30.8 pg (ref 27.2–33.4)
MCHC: 32.7 g/dL (ref 32.0–36.0)
MCV: 94.3 fL (ref 79.3–98.0)
MONO#: 0.4 10*3/uL (ref 0.1–0.9)
MONO%: 9.5 % (ref 0.0–14.0)
NEUT#: 3 10*3/uL (ref 1.5–6.5)
NEUT%: 65.8 % (ref 39.0–75.0)
Platelets: 130 10*3/uL — ABNORMAL LOW (ref 140–400)
RBC: 4.92 10*6/uL (ref 4.20–5.82)
RDW: 14 % (ref 11.0–14.6)
WBC: 4.5 10*3/uL (ref 4.0–10.3)
lymph#: 0.9 10*3/uL (ref 0.9–3.3)

## 2016-06-20 LAB — FERRITIN: Ferritin: 65 ng/ml (ref 22–316)

## 2016-06-20 NOTE — Addendum Note (Signed)
Addended by: Amelia Jo I on: 06/20/2016 09:44 AM   Modules accepted: Medications

## 2016-06-20 NOTE — Progress Notes (Signed)
Hematology and Oncology Follow Up Visit  [name] [id number] [birth year] 70 y.o. 06/20/2016 9:34 AM   Principle Diagnosis: 69 year old with iron deficiency anemia diagnosed in 06/2012. This is likely due to GI  loss from AVMs.  Prior Therapy: IV iron infusion intermittently. S/P Feraheme in 06/2012 and most recent of which in March 2016.  Interim History:  [name] presents today for a follow up visit with his wife. Since his last visit, he continues to report masses improvement in his health. He have lost close to 40 pounds with diet and exercise in his exercise tolerance continue to improve. His fatigue and energy level all dramatically improved.No active bleeding noted. He does report some occasional hematochezia but no overt bleeding at this time. He denied any recent hospitalization or illnesses. He denied recent decompensation from a heart failure standpoint. His quality of life and performance status remains excellent.  He does not report any headaches or blurry does not report any seizure or syncope. Does not report any fevers or chills or sweats. Does not report any chest pain or palpitation. Does not report any cough or wheezing. Does not report any nausea, vomiting or abdominal pain. He does not report any lymphadenopathy or petechiae. Does not report any genitourinary complaints. His review of systems unremarkable.    Medications: I have reviewed the patient's current medications.  Current Outpatient Prescriptions  Medication Sig Dispense Refill  . albuterol (PROVENTIL HFA;VENTOLIN HFA) 108 (90 BASE) MCG/ACT inhaler Inhale 2 puffs into the lungs every 6 (six) hours as needed for wheezing or shortness of breath. 3 Inhaler 4  . albuterol (PROVENTIL) (2.5 MG/3ML) 0.083% nebulizer solution Take 3 mLs (2.5 mg total) by nebulization every 6 (six) hours. 360 mL 6  . amiodarone (PACERONE) 200 MG tablet Take 1 tablet by mouth  daily 90 tablet 3  . aspirin 81 MG tablet Take 81 mg by mouth  daily.    Marland Kitchen atorvastatin (LIPITOR) 40 MG tablet Take 1 tablet by mouth  daily 90 tablet 3  . carvedilol (COREG) 25 MG tablet Take 1 tablet by mouth  twice a day with meals 180 tablet 3  . clopidogrel (PLAVIX) 75 MG tablet Take 1 tablet by mouth  daily 90 tablet 3  . finasteride (PROSCAR) 5 MG tablet Take 5 mg by mouth daily.    . fluticasone (FLONASE) 50 MCG/ACT nasal spray Instill 1 spray in each  nostril daily 32 g 3  . furosemide (LASIX) 80 MG tablet Take 1 tablet by mouth two  times daily 180 tablet 3  . Glycopyrrolate-Formoterol (BEVESPI AEROSPHERE) 9-4.8 MCG/ACT AERO Inhale 2 puffs into the lungs 2 (two) times daily. 2 Inhaler 0  . ipratropium (ATROVENT HFA) 17 MCG/ACT inhaler Inhale 2 puffs into the lungs every 4 (four) hours as needed for wheezing. 1 Inhaler 12  . isosorbide mononitrate (IMDUR) 60 MG 24 hr tablet Take 1 tablet by mouth  daily 90 tablet 3  . levothyroxine (SYNTHROID, LEVOTHROID) 50 MCG tablet Take 1 tablet by mouth  daily before breakfast 90 tablet 1  . Multiple Vitamins-Minerals (MENS 50+ MULTI VITAMIN/MIN) TABS Take 1 tablet by mouth daily.     . multivitamin-lutein (OCUVITE-LUTEIN) CAPS Take 1 capsule by mouth daily.      Marland Kitchen NITROSTAT 0.4 MG SL tablet Dissolve one tablet under  the tongue every 5 minutes  as needed for chest pain  (up to 3 tabs in 15 minutes then call 911) 25 tablet 6  . NON FORMULARY at  bedtime. CPAP And during the day as needed    . sacubitril-valsartan (ENTRESTO) 97-103 MG Take 1 tablet by mouth 2 (two) times daily. 60 tablet 6  . spironolactone (ALDACTONE) 25 MG tablet Take 1 tablet by mouth  daily 90 tablet 3   Current Facility-Administered Medications  Medication Dose Route Frequency Provider Last Rate Last Dose  . ipratropium-albuterol (DUONEB) 0.5-2.5 (3) MG/3ML nebulizer solution 3 mL  3 mL Nebulization Once [name], MD         Allergies:  Allergies  Allergen Reactions  . Tussionex Pennkinetic Er [Hydrocod Polst-Cpm Polst Er]  Other (See Comments)    "caused prostate problems"  . Ace Inhibitors Other (See Comments)    REACTION: cough    Past Medical History, Surgical history, Social history, and Family History were reviewed and updated.     Physical Exam: Blood pressure 100/59, pulse 63, temperature 98.2 F (36.8 C), temperature source Oral, resp. rate 19, height '5\' 8"'$  (1.727 m), weight 235 lb 11.2 oz (106.913 kg), SpO2 96 %. ECOG: 1 General appearance: Pleasant-appearing gentleman without distress. Head: Normocephalic, without obvious abnormality no oral thrush noted. Neck: no JVD. No lymphadenopathy noted. Lymph nodes: Cervical, supraclavicular, and axillary nodes normal. Heart:regular rate and rhythm, S1, S2 normal, no murmur, click, rub or gallop Lung:chest clear, no wheezing, rales, normal symmetric air entry Abdomen: soft, non-tender, without masses or organomegaly no rebound or guarding. EXT:no erythema, induration, or nodules   Lab Results: Lab Results  Component Value Date   WBC 4.5 06/20/2016   HGB 15.2 06/20/2016   HCT 46.5 06/20/2016   MCV 94.3 06/20/2016   PLT 130* 06/20/2016     Chemistry      Component Value Date/Time   NA 141 05/15/2016 0932   NA 145 02/16/2015 1427   K 3.5 05/15/2016 0932   K 4.2 02/16/2015 1427   CL 103 05/15/2016 0932   CL 94* 10/02/2012 0937   CO2 30 05/15/2016 0932   CO2 27 02/16/2015 1427   BUN 23* 05/15/2016 0932   BUN 18.5 02/16/2015 1427   CREATININE 0.98 05/15/2016 0932   CREATININE 1.0 02/16/2015 1427   CREATININE 0.75 10/31/2013 1216      Component Value Date/Time   CALCIUM 8.9 05/15/2016 0932   CALCIUM 9.5 02/16/2015 1427   ALKPHOS 77 05/03/2016 1537   ALKPHOS 108 02/16/2015 1427   AST 21 05/03/2016 1537   AST 17 02/16/2015 1427   ALT 21 05/03/2016 1537   ALT 17 02/16/2015 1427   BILITOT 1.0 05/03/2016 1537   BILITOT 0.42 02/16/2015 1427      Results for [name], [name] (MRN [id number]) as of 06/20/2016 09:27  Ref. Range 03/16/2016  15:08  Iron Latest Ref Range: 42-163 ug/dL 80  UIBC Latest Ref Range: 117-376 ug/dL 234  TIBC Latest Ref Range: 202-409 ug/dL 314  %SAT Latest Ref Range: 20-55 % 25  Ferritin Latest Ref Range: 22-316 ng/ml 41     Impression and Plan:  A 70 year old gentleman with the following issues:  1. Microcytic hypochromic anemia with a documented iron deficiency anemia  in May of 2013. He is status post IV iron on several occasions the last of which was in March 2016.   His hemoglobin is within normal range and iron studies on April 2017 also normal. The plan is to continue with observation and surveillance and repeat iron studies in 6 months. Intravenous iron will be given as needed.  2. GI blood loss: No active losses noted  at this time.  3. Congestive heart failure: He appears to be well compensated at this time. He does not report any respiratory complaints.  4. Follow up: in 6 months.    SHADAD,FIRAS 7/11/20179:34 AM

## 2016-06-20 NOTE — Telephone Encounter (Signed)
per pof to sch pt appt-gave pt copy of avs °

## 2016-07-04 ENCOUNTER — Ambulatory Visit: Payer: Medicare Other | Admitting: Internal Medicine

## 2016-08-04 ENCOUNTER — Encounter (HOSPITAL_COMMUNITY): Payer: Medicare Other

## 2016-08-08 ENCOUNTER — Telehealth: Payer: Self-pay | Admitting: Cardiology

## 2016-08-08 ENCOUNTER — Ambulatory Visit (INDEPENDENT_AMBULATORY_CARE_PROVIDER_SITE_OTHER): Payer: Medicare Other | Admitting: *Deleted

## 2016-08-08 DIAGNOSIS — I255 Ischemic cardiomyopathy: Secondary | ICD-10-CM | POA: Diagnosis not present

## 2016-08-08 NOTE — Telephone Encounter (Signed)
Spoke with pt and reminded pt of remote transmission that is due today. Pt verbalized understanding.   

## 2016-08-09 NOTE — Progress Notes (Signed)
Remote ICD transmission.   

## 2016-08-10 ENCOUNTER — Encounter: Payer: Self-pay | Admitting: Cardiology

## 2016-08-17 ENCOUNTER — Encounter: Payer: Self-pay | Admitting: Internal Medicine

## 2016-08-17 ENCOUNTER — Ambulatory Visit (INDEPENDENT_AMBULATORY_CARE_PROVIDER_SITE_OTHER): Payer: Medicare Other | Admitting: Internal Medicine

## 2016-08-17 VITALS — BP 110/60 | HR 63 | Ht 68.0 in | Wt 232.2 lb

## 2016-08-17 DIAGNOSIS — J449 Chronic obstructive pulmonary disease, unspecified: Secondary | ICD-10-CM | POA: Diagnosis not present

## 2016-08-17 DIAGNOSIS — G4733 Obstructive sleep apnea (adult) (pediatric): Secondary | ICD-10-CM

## 2016-08-17 DIAGNOSIS — Z23 Encounter for immunization: Secondary | ICD-10-CM | POA: Diagnosis not present

## 2016-08-17 DIAGNOSIS — I255 Ischemic cardiomyopathy: Secondary | ICD-10-CM

## 2016-08-17 DIAGNOSIS — Z9989 Dependence on other enabling machines and devices: Principal | ICD-10-CM

## 2016-08-17 NOTE — Patient Instructions (Addendum)
Order- DME [facility] need download CPAP for pressure compliance   Dx OSA  Flu vax- hi strength

## 2016-08-17 NOTE — Progress Notes (Signed)
01/11/2016-70 year old male former smoker followed for dyspnea/COPD, complicated by history OSA, CAD/CHF/MI/AICD/CM/AAA, lung nodule ACUTE Wife states pt does not have enough air to do anything even with O2 levels being normal; Severe SOB and fatigued-feels as though he is smothering-takes about 20 minutes to get through this.  Cough-productive-green for aobut 4-5 months and getting worse. Denies any calf pain. No fever, sore throat or obvious acute infection but sputum is been discolored and cough is annoying so he would like to try an antibiotic. Last visit to CHF clinic 1 month ago with pending cardiology appointment. Using nebulizer 3 or 4 times daily with transient benefit-helps him cough up phlegm. No blood, mostly white but with some yellow or green. Also using pro-air rescue inhaler 2 or 3 times daily. CT chest 10/19/2015 IMPRESSION: 1. The previously described subsolid nodule in the right upper lobe is very similar to prior studies, measuring 2.3 x 0.8 cm on today's examination, with a tiny 2 mm central solid component. This lesion is nonspecific, and given the intimate association with some mildly bronchiectatic bronchi, is favored to represent an area of chronic post infectious or inflammatory scarring. However, repeat noncontrast chest CT is recommended in 12 months to ensure continued stability. This recommendation follows the consensus statement: Recommendations for the Management of Subsolid Pulmonary Nodules Detected at CT: A Statement from the Fleischner Society as published in Radiology 2013; 266:304-317. 2. Atherosclerosis, including left main and 3 vessel coronary artery disease. Status post median sternotomy for CABG. 3. Mild diffuse bronchial wall thickening with mild centrilobular and paraseptal emphysema ; imaging findings suggestive of underlying COPD. 4. Additional incidental findings, as above. CXR 11/30/2015 IMPRESSION: 1. AICD in good anatomic position. 2.  Findings suggesting mild congestive heart failure with mild pulmonary interstitial edema .  03/30/2016-70 year old male former smoker followed for dyspnea/COPD, lung nodule, OSA complicated by history , CAD/CHF/MI/AICD/CM/AAA, morbid obesity CPAP auto/[facility] ACUTE VISIT: Went to Cardiologist and was told that SOB is not related to his heart-needed to f/u here. Pt states he has hard time throughout entire day with SOB and wheezing-uses CPAP machine. Has to force air and out to help. Unable to have good quality of life due to breathing. Known severe obstructive airways disease from previous PFT. Wheeze and cough on waking in the morning. Dyspnea with exertion and sometimes while lying supine. Dieting for weight loss. CXR 02/02/2016 IMPRESSION: COPD-reactive airway disease. Mild stable cardiomegaly with central pulmonary vascular prominence may reflect low-grade compensated CHF. There is no alveolar pneumonia. Electronically Signed  By: David Martinique M.D.  On: 02/02/2016 13:39  08/17/2016-70 year old male former smoker followed for dyspnea/COPD, lung nodule, OSA complicated by CAD/CHF/MI/AICD/CM/AAA, morbid obesity CPAP 12 or auto/[facility] FOLLOWS FOR: DME: [facility]. Pt wears CPAP every night for at least 8 hours and at nap time. No DL. Needs to adjust pressure-seems too weak. Echocardiogram 04/14/2016-EF 20-25%, diffuse hypokinesis grade 1 diastolic dysfunction Breathing better since he dieted off 40 pounds over last several months. Thinks CPAP pressure is a little low. Uses old machine for travel so download shows lower compliance but he really uses it almost every night.  ROS-see HPI Constitutional:  +  weight loss, night sweats, fevers, chills, fatigue, lassitude. HEENT:   No-  headaches, difficulty swallowing, tooth/dental problems, sore throat,       No-  sneezing, itching, ear ache, nasal congestion, post nasal drip,  CV:  No-   chest pain,  +orthopnea, PND, +swelling in lower extremities, No-anasarca, dizziness, palpitations  Resp: + shortness of breath with exertion or at rest.              No-   productive cough,  No non-productive cough,  No- coughing up of blood.              No-   change in color of mucus.  No- wheezing.   Skin: No-   rash or lesions. GI:  No-   heartburn, indigestion, abdominal pain, nausea, vomiting, GU: . MS:  + joint pain or swelling.  Neuro-     nothing unusual Psych:  No- change in mood or affect. No depression or anxiety.  No memory loss.  OBJ- Physical Exam    General- Alert, Oriented, Affect-appropriate, Distress- none acute, +obese but definite weight loss Skin- clear Lymphadenopathy- none Head- atraumatic            Eyes- Gross vision intact, PERRLA, conjunctivae and secretions clear            Ears- Hearing, canals-normal            Nose- +turbinate edema, no-Septal dev, mucus, polyps, erosion, perforation             Throat- Mallampati III, mucosa clear , drainage- none, tonsils- atrophic Neck- flexible , trachea midline, no stridor , thyroid nl, carotid no bruit Chest - symmetrical excursion , unlabored           Heart/CV- RRR , no murmur , no gallop  , no rub, nl s1 s2                           - JVD- none , edema -none, stasis changes- none, varices- none           Lung- clear to P&A shallow inspiratory effort, rales- none, wheeze-none, cough- none ,  dullness-none, rub- none,            Chest wall- sternotomy scar,  Pacemaker defibrillator L Abd-  Br/ Gen/ Rectal- Not done, not indicated Extrem- cyanosis- none, clubbing, none, atrophy- none, strength- nl, vein donor scars Neuro- head bob tremor

## 2016-08-18 ENCOUNTER — Ambulatory Visit (HOSPITAL_COMMUNITY)
Admission: RE | Admit: 2016-08-18 | Discharge: 2016-08-18 | Disposition: A | Discharge status: Home / Self Care | Payer: Medicare Other | Source: Ambulatory Visit | Attending: Cardiology | Admitting: Cardiology

## 2016-08-18 VITALS — BP 114/72 | HR 60 | Wt 231.1 lb

## 2016-08-18 DIAGNOSIS — Z87891 Personal history of nicotine dependence: Secondary | ICD-10-CM | POA: Insufficient documentation

## 2016-08-18 DIAGNOSIS — I255 Ischemic cardiomyopathy: Secondary | ICD-10-CM

## 2016-08-18 DIAGNOSIS — E119 Type 2 diabetes mellitus without complications: Secondary | ICD-10-CM | POA: Diagnosis not present

## 2016-08-18 DIAGNOSIS — I2510 Atherosclerotic heart disease of native coronary artery without angina pectoris: Secondary | ICD-10-CM

## 2016-08-18 DIAGNOSIS — K219 Gastro-esophageal reflux disease without esophagitis: Secondary | ICD-10-CM | POA: Insufficient documentation

## 2016-08-18 DIAGNOSIS — E785 Hyperlipidemia, unspecified: Secondary | ICD-10-CM | POA: Diagnosis not present

## 2016-08-18 DIAGNOSIS — I110 Hypertensive heart disease with heart failure: Secondary | ICD-10-CM | POA: Diagnosis not present

## 2016-08-18 DIAGNOSIS — I472 Ventricular tachycardia: Secondary | ICD-10-CM | POA: Insufficient documentation

## 2016-08-18 DIAGNOSIS — Z951 Presence of aortocoronary bypass graft: Secondary | ICD-10-CM | POA: Diagnosis not present

## 2016-08-18 DIAGNOSIS — Z800 Family history of malignant neoplasm of digestive organs: Secondary | ICD-10-CM | POA: Insufficient documentation

## 2016-08-18 DIAGNOSIS — E039 Hypothyroidism, unspecified: Secondary | ICD-10-CM | POA: Insufficient documentation

## 2016-08-18 DIAGNOSIS — I252 Old myocardial infarction: Secondary | ICD-10-CM | POA: Insufficient documentation

## 2016-08-18 DIAGNOSIS — D509 Iron deficiency anemia, unspecified: Secondary | ICD-10-CM | POA: Diagnosis not present

## 2016-08-18 DIAGNOSIS — Z6835 Body mass index (BMI) 35.0-35.9, adult: Secondary | ICD-10-CM | POA: Diagnosis not present

## 2016-08-18 DIAGNOSIS — R911 Solitary pulmonary nodule: Secondary | ICD-10-CM | POA: Diagnosis not present

## 2016-08-18 DIAGNOSIS — Z8249 Family history of ischemic heart disease and other diseases of the circulatory system: Secondary | ICD-10-CM | POA: Insufficient documentation

## 2016-08-18 DIAGNOSIS — I5042 Chronic combined systolic (congestive) and diastolic (congestive) heart failure: Secondary | ICD-10-CM | POA: Diagnosis not present

## 2016-08-18 DIAGNOSIS — G4733 Obstructive sleep apnea (adult) (pediatric): Secondary | ICD-10-CM | POA: Insufficient documentation

## 2016-08-18 DIAGNOSIS — N400 Enlarged prostate without lower urinary tract symptoms: Secondary | ICD-10-CM | POA: Insufficient documentation

## 2016-08-18 DIAGNOSIS — J441 Chronic obstructive pulmonary disease with (acute) exacerbation: Secondary | ICD-10-CM | POA: Diagnosis not present

## 2016-08-18 DIAGNOSIS — I714 Abdominal aortic aneurysm, without rupture: Secondary | ICD-10-CM | POA: Insufficient documentation

## 2016-08-18 DIAGNOSIS — Z8679 Personal history of other diseases of the circulatory system: Secondary | ICD-10-CM | POA: Diagnosis not present

## 2016-08-18 DIAGNOSIS — Z888 Allergy status to other drugs, medicaments and biological substances status: Secondary | ICD-10-CM | POA: Insufficient documentation

## 2016-08-18 DIAGNOSIS — Z836 Family history of other diseases of the respiratory system: Secondary | ICD-10-CM | POA: Insufficient documentation

## 2016-08-18 DIAGNOSIS — I447 Left bundle-branch block, unspecified: Secondary | ICD-10-CM | POA: Insufficient documentation

## 2016-08-18 DIAGNOSIS — Z833 Family history of diabetes mellitus: Secondary | ICD-10-CM | POA: Insufficient documentation

## 2016-08-18 DIAGNOSIS — Z7982 Long term (current) use of aspirin: Secondary | ICD-10-CM | POA: Diagnosis not present

## 2016-08-18 DIAGNOSIS — Z809 Family history of malignant neoplasm, unspecified: Secondary | ICD-10-CM | POA: Insufficient documentation

## 2016-08-18 DIAGNOSIS — I739 Peripheral vascular disease, unspecified: Secondary | ICD-10-CM | POA: Insufficient documentation

## 2016-08-18 DIAGNOSIS — Z95810 Presence of automatic (implantable) cardiac defibrillator: Secondary | ICD-10-CM | POA: Insufficient documentation

## 2016-08-18 DIAGNOSIS — Z7901 Long term (current) use of anticoagulants: Secondary | ICD-10-CM | POA: Insufficient documentation

## 2016-08-18 DIAGNOSIS — J449 Chronic obstructive pulmonary disease, unspecified: Secondary | ICD-10-CM

## 2016-08-18 LAB — COMPREHENSIVE METABOLIC PANEL
ALT: 20 U/L (ref 17–63)
AST: 18 U/L (ref 15–41)
Albumin: 3.7 g/dL (ref 3.5–5.0)
Alkaline Phosphatase: 62 U/L (ref 38–126)
Anion gap: 8 (ref 5–15)
BUN: 22 mg/dL — ABNORMAL HIGH (ref 6–20)
CO2: 29 mmol/L (ref 22–32)
Calcium: 9.1 mg/dL (ref 8.9–10.3)
Chloride: 105 mmol/L (ref 101–111)
Creatinine, Ser: 0.79 mg/dL (ref 0.61–1.24)
GFR calc Af Amer: >60 mL/min (ref >60)
GFR calc non Af Amer: >60 mL/min (ref >60)
Glucose, Bld: 102 mg/dL — ABNORMAL HIGH (ref 65–99)
Potassium: 3.9 mmol/L (ref 3.5–5.1)
Sodium: 142 mmol/L (ref 135–145)
Total Bilirubin: 0.8 mg/dL (ref 0.3–1.2)
Total Protein: 6.4 g/dL — ABNORMAL LOW (ref 6.5–8.1)

## 2016-08-18 LAB — LIPID PANEL
Cholesterol: 127 mg/dL (ref 0–200)
HDL: 29 mg/dL — ABNORMAL LOW (ref >40)
LDL Cholesterol: 69 mg/dL (ref 0–99)
Total CHOL/HDL Ratio: 4.4 RATIO
Triglycerides: 144 mg/dL (ref <150)
VLDL: 29 mg/dL (ref 0–40)

## 2016-08-18 LAB — TSH: TSH: 4.042 u[IU]/mL (ref 0.350–4.500)

## 2016-08-18 NOTE — Progress Notes (Signed)
Patient ID: RA PFIESTER, male   DOB: Apr 14, 1946, 70 y.o.   MRN: [id number] [facility]  08/18/2016 ROLFE HARTSELL   09/13/1946  [id number]  Primary Physicia [name], MD Hematologist: Dr Alen Blew  Pulmonary: Dr Annamaria Boots Cardiology: [name]  HPI:  Mr. Seago is a pleasant 70 yo with a past medical history significant for morbid obesity, systolic heart failure with an EF of 30-35% (January 2014) -> 20-25% (March 2015 - RV normal), VT on amio, CAD s/p CABG 1996, LBBB, COPD, obstructive sleep apnea on CPAP, AAA repair (January 2015) who was admitted to [facility] on 02/24/14 for acute on chronic HF and acute COPD exacerbation. He also had iron deficiency anemia.   Admitted to [facility] July 24 through July 06 2014 with chest pain.  CEs negative. Had a LHC with no change from previous LHC with recommendations to continue medical management. Discharge weight was 255 pounds.   LHC 07/06/14 --No significant change from previous studies.  Left anterior descending (LAD): The LAD is a large vessel. There is a 40% stenosis immediately after the takeoff of a large septal perforator. There are 2 large diagonal branches without significant disease. Left circumflex (LCx): The LCx is occluded proximally. There are left to left collaterals.  Right coronary artery (RCA): The RCA is occluded proximally immediately following the conus branch. There are right to right and left to right collaterals. SVGs from CABG known to be totally occluded.   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   CTA chest/abd/pelvis (3/16) with moderate emphysema, 1.3 cm nodule RUL, left SFA totally occluded, right SFA with significant stenosis, s/p endovascular AAA repair (stable).   Echo (5/17) with EF 20-25%, severe LV dilation, mild MR, normal RV size with mildly decreased systolic function.   He returns today for regular follow up. At last visit Entresto increased to goal dose. He is down 16 lbs from visit in May. Says he is  really watching his portions. Walking 1-2 miles a day. Does have some mild dyspnea on inclines or when he gets in a hurry.  Using CPAP every night and with naps. Taking all medications as directed. Hasn't needed to take any extra lasix.  Occasionally taking tylenol extra strength for back pain.  Doesn't need every day.   Labs 3/15 Cr 1.5 K 5.6 Labs 07/06/14 K 3.9 Creatinine  0.98 Labs 9/15 K 5.1, creatinine 0.96 Labs 11/15 K 3.8, creatinine 0.83, LDL 61, LFTs normal, TSH normal Labs 11/26/14 K 5.0 Creatinine 0.73  Labs 12/08/14 K 3.4 Creatinine 0.83  Labs 3/16 K 4.4, creatinine 1.03, LFTs normal, TSH normal, HCT 37.8, BNP 65 Labs 6/16 K 4.1, creatinine 0.93, TSH normal, LFTs normal Labs 11/16 K 5.1, creatinine 1.13, LFTs normal, TSH normal, LDL 83, HDL 33, TGs 162 Labs 1/17 K 4.4, creatinine 0.86, pro-BNP 154 Labs 2/17 BNP 113, K 3.8, creatinine 0.79, TSH normal, LFTs normal Labs 4/17 K 3.8, creatinine 0.91, HCT 48.1 Labs 6/17 K 3.5, creatinine 0.98  ECG: NSR, low voltage Ps  ROS: All systems reviewed and negative except as per HPI.   Past Medical History:  Diagnosis Date  . AAA (abdominal aortic aneurysm) ([place])    followed by Dr. Bridgett Larsson  . Adenomatous colon polyp 01/2004  . Anemia    hx  . Automatic implantable cardioverter-defibrillator in situ   . AVM (arteriovenous malformation)   . BPH (benign prostatic hypertrophy)   . CAD (coronary artery disease)    s/p CABGx2 in 1996  .  Carotid artery stenosis    LCEA - Dr. Bridgett Larsson in 2013  . CHF (congestive heart failure) ([place])   . Complication of anesthesia    claustrophobic, unabe to lie on back more than 4 hours at time due to back  . COPD (chronic obstructive pulmonary disease) ([place])   . Diabetes mellitus    DIET CONTROLLED- pt states that this was a misdiagnosis, he was treated while in the hospital  but returned home, loss a massive amount of weight and he has not had a problem with his blood sugar since. States everything has been  normal for about 3 years.  . Diverticulosis   . Dysrhythmia    ICD-defibrillator  . Fatigue   . GERD (gastroesophageal reflux disease)   . H/O hiatal hernia   . HLD (hyperlipidemia)   . Hypertension   . Hypothyroidism   . Ischemic cardiomyopathy   . Myocardial infarction ([place])   . OSA on CPAP    AHI durign total sleep 14.69/hr, during REM 50.91/hr  . Peripheral vascular disease ([facility])    LCEA, L renal artery stent, bilat iliac stents, R SFA stenosis  . Tremor   . Ventricular tachycardia ([place]) 09/09/2014   Amiodarone was started after appropriate defibrillator shocks for ventricular tachycardia in October 2008    Current Outpatient Prescriptions  Medication Sig Dispense Refill  . albuterol (PROVENTIL HFA;VENTOLIN HFA) 108 (90 BASE) MCG/ACT inhaler Inhale 2 puffs into the lungs every 6 (six) hours as needed for wheezing or shortness of breath. 3 Inhaler 4  . albuterol (PROVENTIL) (2.5 MG/3ML) 0.083% nebulizer solution Take 3 mLs (2.5 mg total) by nebulization every 6 (six) hours. 360 mL 6  . amiodarone (PACERONE) 200 MG tablet Take 1 tablet by mouth  daily 90 tablet 3  . aspirin 81 MG tablet Take 81 mg by mouth daily.    Marland Kitchen atorvastatin (LIPITOR) 40 MG tablet Take 1 tablet by mouth  daily 90 tablet 3  . carvedilol (COREG) 25 MG tablet Take 1 tablet by mouth  twice a day with meals 180 tablet 3  . clopidogrel (PLAVIX) 75 MG tablet Take 1 tablet by mouth  daily 90 tablet 3  . finasteride (PROSCAR) 5 MG tablet Take 5 mg by mouth daily.    . fluticasone (FLONASE) 50 MCG/ACT nasal spray Instill 1 spray in each  nostril daily 32 g 3  . furosemide (LASIX) 80 MG tablet Take 1 tablet by mouth two  times daily 180 tablet 3  . Glycopyrrolate-Formoterol (BEVESPI AEROSPHERE) 9-4.8 MCG/ACT AERO Inhale 2 puffs into the lungs 2 (two) times daily. 2 Inhaler 0  . ipratropium (ATROVENT HFA) 17 MCG/ACT inhaler Inhale 2 puffs into the lungs every 4 (four) hours as needed for wheezing. 1 Inhaler 12  .  isosorbide mononitrate (IMDUR) 60 MG 24 hr tablet Take 1 tablet by mouth  daily 90 tablet 3  . levothyroxine (SYNTHROID, LEVOTHROID) 50 MCG tablet Take 1 tablet by mouth  daily before breakfast 90 tablet 1  . Multiple Vitamins-Minerals (MENS 50+ MULTI VITAMIN/MIN) TABS Take 1 tablet by mouth daily.     . multivitamin-lutein (OCUVITE-LUTEIN) CAPS Take 1 capsule by mouth daily.      Marland Kitchen NITROSTAT 0.4 MG SL tablet Dissolve one tablet under  the tongue every 5 minutes  as needed for chest pain  (up to 3 tabs in 15 minutes then call 911) 25 tablet 6  . NON FORMULARY at bedtime. CPAP And during the day as needed    .  sacubitril-valsartan (ENTRESTO) 97-103 MG Take 1 tablet by mouth 2 (two) times daily. 60 tablet 6  . spironolactone (ALDACTONE) 25 MG tablet Take 1 tablet by mouth  daily 90 tablet 3   Current Facility-Administered Medications  Medication Dose Route Frequency Provider Last Rate Last Dose  . ipratropium-albuterol (DUONEB) 0.5-2.5 (3) MG/3ML nebulizer solution 3 mL  3 mL Nebulization Once [name], MD        Allergies  Allergen Reactions  . Tussionex Pennkinetic Er [Hydrocod Polst-Cpm Polst Er] Other (See Comments)    "caused prostate problems"  . Ace Inhibitors Other (See Comments)    REACTION: cough    Social History   Social History  . Marital status: Married    Spouse name: [name]  . Number of children: 2  . Years of education: [name]   Occupational History  . retired Solicitor     Social History Main Topics  . Smoking status: Former Smoker    Packs/day: 1.50    Years: 40.00    Types: Cigarettes    Quit date: 12/11/2002  . Smokeless tobacco: Former Systems developer  . Alcohol use No  . Drug use: No  . Sexual activity: Not Currently   Other Topics Concern  . Not on file   Social History Narrative  . No narrative on file    Family History  Problem Relation Age of Onset  . Colon cancer Sister   . Cancer Sister   . Hypertension Sister   . Hyperlipidemia  Sister   . Diabetes Sister   . Heart disease Sister     before age 33  . Heart disease Brother   . Lung cancer Mother   . Cancer Mother   . Diabetes Mother   . Hypertension Mother   . Hyperlipidemia Mother   . Heart disease Mother     before age 14  . Heart attack Maternal Grandmother   . Colon polyps Sister   . Heart attack Father   . Heart disease Father     before age 38  . Hypertension Father   . COPD Sister   . Diabetes Daughter    Blood pressure 114/72, pulse 60, weight 231 lb 1.9 oz (104.8 kg), SpO2 96 %.   Wt Readings from Last 3 Encounters:  08/18/16 231 lb 1.9 oz (104.8 kg)  08/17/16 232 lb 3.2 oz (105.3 kg)  06/20/16 235 lb 11.2 oz (106.9 kg)    General appearance: alert, cooperative and no distress.  Neck: Thick.  No carotid bruit or JVP appreciated. L CEA scar Lungs: Distant breath sounds bilaterally.     Heart: regular rate and rhythm, S1, S2 normal, 1/6 SEM at RUSB.  No S3/S4. Unable to palpate pedal pulses.  Extremities:  No clubbing/cyanosis. Trace ankle edema.  Skin: warm and dry Neurologic: Grossly normal  ASSESSMENT/PLAN:  1. Chronic systolic CHF: Ischemic cardiomyopathy. ECHO 5/17 EF 20-25% with severe LV dilation.  [employer] CRT-D. NYHA class II symptoms.  Volume status looks stable, weight continues to fall.  - Continue lasix 80 mg BID. CMET today.  - Continue carvedilol 25 mg twice a day  - Continue Entresto 97/103 bid.     - Continue spironolactone 25 mg daily.  2. CAD s/p CABG: No chest pain. Continue 81 mg aspirin daily and atorvastatin daily.   - Check lipids today.  3. VT s/p ICD: On amiodarone for history of ICD discharges due to VT.   - CMET and TSH today. Knows  he needs yearly eye exams.   4. Morbid obesity: Congratulated on his continued weight loss. 5. COPD: Moderate to severe. Has lung nodule, positionally not a great candidate for biopsy.   - Pulmonary followup.  - Dr Roxan Hockey following nodule with serial CTs.  6. PAD:  Occluded left SFA and significant right SFA stenosis on last CTA.  He does not report claudication and does not have pedal ulcers.  Followed by [facility]. He is on statin.   CMET, TSH, and Lipids today.  Follow up 4 months.    Shirley Friar, PA-C 08/18/2016   Patient seen with PA, agree with the above note.  He is doing quite well, has lost another 16 lbs. No significant dyspnea.  On a good medical regimen.  Will check labs today as above.  Will see him back in 4 months.   [name] 08/19/2016

## 2016-08-18 NOTE — Patient Instructions (Signed)
Labs today  We will contact you in 4 months to schedule your next appointment.  

## 2016-08-19 NOTE — Assessment & Plan Note (Signed)
Less dyspnea on exertion since he has lost weight. No recent acute respiratory complaint.

## 2016-08-19 NOTE — Assessment & Plan Note (Signed)
Discussed compliance goals. We need to get a download from his DME company as basis for trying a little pressure increase as he requests.

## 2016-08-22 ENCOUNTER — Encounter: Payer: Self-pay | Admitting: Internal Medicine

## 2016-08-25 ENCOUNTER — Encounter: Payer: Self-pay | Admitting: Cardiology

## 2016-08-29 ENCOUNTER — Other Ambulatory Visit (HOSPITAL_COMMUNITY): Payer: Self-pay | Admitting: Cardiology

## 2016-08-31 LAB — CUP PACEART REMOTE DEVICE CHECK
Battery Remaining Longevity: 96 mo
Battery Remaining Percentage: 100 %
Brady Statistic RA Percent Paced: 0 %
Brady Statistic RV Percent Paced: 86 %
Date Time Interrogation Session: 20170913152800
HighPow Impedance: 45 Ohm
Implantable Lead Implant Date: 20060411
Implantable Lead Implant Date: 20151030
Implantable Lead Implant Date: 20151030
Implantable Lead Location: 753858
Implantable Lead Location: 753859
Implantable Lead Location: 753860
Implantable Lead Model: 148
Implantable Lead Model: 4136
Implantable Lead Serial Number: 145070
Implantable Lead Serial Number: 29713415
Lead Channel Impedance Value: 541 Ohm
Lead Channel Impedance Value: 589 Ohm
Lead Channel Impedance Value: 689 Ohm
Lead Channel Pacing Threshold Amplitude: 0.7 V
Lead Channel Pacing Threshold Amplitude: 0.8 V
Lead Channel Pacing Threshold Amplitude: 2.5 V
Lead Channel Pacing Threshold Pulse Width: 0.4 ms
Lead Channel Pacing Threshold Pulse Width: 0.4 ms
Lead Channel Pacing Threshold Pulse Width: 0.9 ms
Lead Channel Setting Pacing Amplitude: 2 V
Lead Channel Setting Pacing Amplitude: 2 V
Lead Channel Setting Pacing Amplitude: 3.5 V
Lead Channel Setting Pacing Pulse Width: 0.4 ms
Lead Channel Setting Pacing Pulse Width: 0.9 ms
Lead Channel Setting Sensing Sensitivity: 0.6 mV
Lead Channel Setting Sensing Sensitivity: 1 mV
Pulse Gen Serial Number: 370874

## 2016-09-02 ENCOUNTER — Other Ambulatory Visit: Payer: Self-pay | Admitting: Cardiovascular Disease

## 2016-10-03 ENCOUNTER — Encounter: Payer: Self-pay | Admitting: Family

## 2016-10-03 ENCOUNTER — Other Ambulatory Visit: Payer: Self-pay | Admitting: Thoracic Surgery (Cardiothoracic Vascular Surgery)

## 2016-10-03 DIAGNOSIS — R911 Solitary pulmonary nodule: Secondary | ICD-10-CM

## 2016-10-06 ENCOUNTER — Ambulatory Visit (INDEPENDENT_AMBULATORY_CARE_PROVIDER_SITE_OTHER): Payer: Medicare Other | Admitting: Family

## 2016-10-06 ENCOUNTER — Ambulatory Visit (INDEPENDENT_AMBULATORY_CARE_PROVIDER_SITE_OTHER)
Admission: RE | Admit: 2016-10-06 | Discharge: 2016-10-06 | Disposition: A | Discharge status: Home / Self Care | Payer: Medicare Other | Source: Ambulatory Visit | Attending: Family | Admitting: Family

## 2016-10-06 ENCOUNTER — Ambulatory Visit (HOSPITAL_COMMUNITY)
Admission: RE | Admit: 2016-10-06 | Discharge: 2016-10-06 | Disposition: A | Discharge status: Home / Self Care | Payer: Medicare Other | Source: Ambulatory Visit | Attending: Family | Admitting: Family

## 2016-10-06 ENCOUNTER — Encounter: Payer: Self-pay | Admitting: Family

## 2016-10-06 VITALS — BP 88/50 | HR 66 | Temp 97.4°F | Resp 16 | Ht 68.0 in | Wt 226.0 lb

## 2016-10-06 DIAGNOSIS — I6522 Occlusion and stenosis of left carotid artery: Secondary | ICD-10-CM

## 2016-10-06 DIAGNOSIS — I739 Peripheral vascular disease, unspecified: Secondary | ICD-10-CM | POA: Insufficient documentation

## 2016-10-06 DIAGNOSIS — I6521 Occlusion and stenosis of right carotid artery: Secondary | ICD-10-CM | POA: Diagnosis not present

## 2016-10-06 DIAGNOSIS — Z95828 Presence of other vascular implants and grafts: Secondary | ICD-10-CM

## 2016-10-06 DIAGNOSIS — I63231 Cerebral infarction due to unspecified occlusion or stenosis of right carotid arteries: Secondary | ICD-10-CM

## 2016-10-06 DIAGNOSIS — I714 Abdominal aortic aneurysm, without rupture, unspecified: Secondary | ICD-10-CM

## 2016-10-06 DIAGNOSIS — I779 Disorder of arteries and arterioles, unspecified: Secondary | ICD-10-CM

## 2016-10-06 DIAGNOSIS — Z9889 Other specified postprocedural states: Secondary | ICD-10-CM

## 2016-10-06 NOTE — Progress Notes (Signed)
[facility] [place]  CC: Follow up s/p EVAR  History of Present Illness  [name] is a 70 y.o. (1946/03/25) male patient of Dr. Bridgett Larsson who presents for routine follow up s/p EVAR (Date: 01/06/14).  EVAR duplex (Date: 04/02/15) demonstrates: no endoleak and decreased sac size.  Most recent CTA (Date: 02/19/15) demonstrates: no endoleak and decreasing sac size.  The patient has not had back or abdominal pain. He is also S/p L CEA on 01/04/12, known RICA occlusion, Known L SFA occlusion.  He states his blood pressure has been low since he developed CHF.  He walks 1.5 miles daily. He meditates daily, states he did today during the non invasive vascular testing, states his blood pressure decreases from this.  He denies feeling light headed.  He was diagnosed recently with vertigo.   Pt Diabetic: No Pt smoker: former smoker, quit in 2004, smoked x 40 years   Past Medical History:  Diagnosis Date  . AAA (abdominal aortic aneurysm) ([place])    followed by Dr. Bridgett Larsson  . Adenomatous colon polyp 01/2004  . Anemia    hx  . Automatic implantable cardioverter-defibrillator in situ   . AVM (arteriovenous malformation)   . BPH (benign prostatic hypertrophy)   . CAD (coronary artery disease)    s/p CABGx2 in 1996  . Carotid artery stenosis    LCEA - Dr. Bridgett Larsson in 2013  . CHF (congestive heart failure) ([place])   . Complication of anesthesia    claustrophobic, unabe to lie on back more than 4 hours at time due to back  . COPD (chronic obstructive pulmonary disease) ([place])   . Diabetes mellitus    DIET CONTROLLED- pt states that this was a misdiagnosis, he was treated while in the hospital  but returned home, loss a massive amount of weight and he has not had a problem with his blood sugar since. States everything has been normal for about 3 years.  . Diverticulosis   . Dysrhythmia    ICD-defibrillator  . Fatigue   . GERD (gastroesophageal reflux disease)   . H/O hiatal hernia   .  HLD (hyperlipidemia)   . Hypertension   . Hypothyroidism   . Ischemic cardiomyopathy   . Myocardial infarction   . OSA on CPAP    AHI durign total sleep 14.69/hr, during REM 50.91/hr  . Peripheral vascular disease ([facility])    LCEA, L renal artery stent, bilat iliac stents, R SFA stenosis  . Tremor   . Ventricular tachycardia ([place]) 09/09/2014   Amiodarone was started after appropriate defibrillator shocks for ventricular tachycardia in October 2008   Past Surgical History:  Procedure Laterality Date  . ABDOMINAL AORTIC ENDOVASCULAR STENT GRAFT [name] 01/06/2014   Procedure: ABDOMINAL AORTIC ENDOVASCULAR STENT GRAFT- GORE; ULTRASOUND GUIDED;  Surgeon: Conrad Duran, MD;  Location: [facility];  Service: Vascular;  Laterality: [name];  . ANGIOPLASTY     BILATERAL  LE  W/STENTS  . BI-VENTRICULAR IMPLANTABLE CARDIOVERTER DEFIBRILLATOR UPGRADE [name] 10/09/2014   Procedure: BI-VENTRICULAR IMPLANTABLE CARDIOVERTER DEFIBRILLATOR UPGRADE;  Surgeon: Evans Lance, MD;  Location: [facility] C[facility] LAB;  Service: Cardiovascular;  Laterality: [name];  . BIV ICD GENERTAOR CHANGE OUT  10/09/2014   UPGRADE TO BIV        BY DR Lovena Le  . CARDIAC CATHETERIZATION  09/16/2007   occlusion of both vein grafts, no significant LAD disease or diagonal disease, Cfx collaterals from the left, 70% in-stent restenosis of L renal artery, normal L main, RCA  occluded ostially (Dr. Adora Fridge)  . CARDIAC CATHETERIZATION  10/03/2002   SVG sequentially to OM1 & OM2 totally occluded at ostium, SVG to PDA totally occluded within previously placed prox vein graft stent, smal distal AAA, bialt iliac stents with 30% left end-stent restenosis and 50% right end-stent restnosis(Dr. Gerrie Nordmann)  . CARDIAC CATHETERIZATION  06/11/1998   L main with 20% narrowing in distal 1/3; LAD with 1st diagonla having 70% ostial narrowing, 2nd diagonal with 40% narrowing in prox third, LIMA & RIMA widely patent; in-stent restenosis of RCIA with successful PTA and new prox SVTRCA stent  for residual disease (Dr. Marella Chimes)  . CARDIAC DEFIBRILLATOR PLACEMENT  06/2005   [employer] Vitality HE - ischemic cardiomyopathy - Dr. Marella Chimes  . CAROTID ENDARTERECTOMY Left 01/04/12  . CORONARY ANGIOPLASTY  01/08/2004   cutting balloon atherectomy & percutaneous intervention of RCIA in-stent restenosis (Dr. Marella Chimes)  . CORONARY ARTERY BYPASS GRAFT  11/04/1985   x2 - PDA and sequential DX-OM (Dr. Redmond Pulling)  . ENDARTERECTOMY  01/04/2012   Procedure: ENDARTERECTOMY CAROTID;left  Surgeon: Hinda Lenis, MD;  Location: [facility];  Service: Vascular;  Laterality: Left;  with patch angioplasty  . ICD GENERATOR CHANGE  05/02/2010   [employer]  . ILIAC ARTERY STENT Bilateral 03/1997   and L SFA PTA  . Iron infusion  June 16, 2012  . LEFT HEART CATHETERIZATION WITH CORONARY ANGIOGRAM [name] 07/06/2014   Procedure: LEFT HEART CATHETERIZATION WITH CORONARY ANGIOGRAM;  Surgeon: Peter M Martinique, MD;  Location: [facility] CATH LAB;  Service: Cardiovascular;  Laterality: [name];  . NM MYOCAR PERF WALL MOTION  09/2012   lexiscan myoview - mod-severe perfusion defect r/t infarct or scar w/mild periinfarct ishcemia in basal inferior, mid inferior, apical inferior, basal inferolateral & mid inferoalteral regions - EF 21% low risk scan  . POLYPECTOMY    . RENAL ARTERY STENT Left 03/24/2004   6x37m [employer] stent (Dr. RMarella Chimes  . TRANSTHORACIC ECHOCARDIOGRAM  12/2012   EF 30-35; LV mod to severely dilated, mod concentric hypertrophy, severe hypokinesis of inferolateral myocardium, moderate hypokineis of anteroseptal region, grade 1 diastolic dysfunction; mod MR; LA mod-severely dialted; RV mod dialted; RA mildly dilated; PA peak pressure 362mg   Social History Social History  Substance Use Topics  . Smoking status: Former Smoker    Packs/day: 1.50    Years: 40.00    Types: Cigarettes    Quit date: 12/11/2002  . 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 Alcohol use No   Family History Family History   Problem Relation Age of Onset  . Colon cancer Sister   . Cancer Sister   . Hypertension Sister   . Hyperlipidemia Sister   . Diabetes Sister   . Heart disease Sister     before age 71. Heart disease Brother   . Lung cancer Mother   . Cancer Mother   . Diabetes Mother   . Hypertension Mother   . Hyperlipidemia Mother   . Heart disease Mother     before age 71. Heart attack Maternal Grandmother   . Colon polyps Sister   . Heart attack Father   . Heart disease Father     before age 71. Hypertension Father   . COPD Sister   . Diabetes Daughter    Current Outpatient Prescriptions on File Prior to Visit  Medication Sig Dispense Refill  . albuterol (PROVENTIL HFA;VENTOLIN HFA) 108 (90 BASE) MCG/ACT inhaler Inhale 2 puffs  into the lungs every 6 (six) hours as needed for wheezing or shortness of breath. 3 Inhaler 4  . albuterol (PROVENTIL) (2.5 MG/3ML) 0.083% nebulizer solution Take 3 mLs (2.5 mg total) by nebulization every 6 (six) hours. 360 mL 6  . amiodarone (PACERONE) 200 MG tablet Take 1 tablet by mouth  daily 90 tablet 3  . aspirin 81 MG tablet Take 81 mg by mouth daily.    Marland Kitchen atorvastatin (LIPITOR) 40 MG tablet Take 1 tablet by mouth  daily 90 tablet 3  . carvedilol (COREG) 25 MG tablet Take 1 tablet by mouth  twice a day with meals 180 tablet 3  . clopidogrel (PLAVIX) 75 MG tablet Take 1 tablet by mouth  daily 90 tablet 3  . finasteride (PROSCAR) 5 MG tablet Take 5 mg by mouth daily.    . fluticasone (FLONASE) 50 MCG/ACT nasal spray Instill 1 spray in each  nostril daily 32 g 3  . furosemide (LASIX) 80 MG tablet Take 1 tablet by mouth two  times daily 180 tablet 3  . Glycopyrrolate-Formoterol (BEVESPI AEROSPHERE) 9-4.8 MCG/ACT AERO Inhale 2 puffs into the lungs 2 (two) times daily. 2 Inhaler 0  . ipratropium (ATROVENT HFA) 17 MCG/ACT inhaler Inhale 2 puffs into the lungs every 4 (four) hours as needed for wheezing. 1 Inhaler 12  . isosorbide mononitrate (IMDUR) 60 MG 24  hr tablet Take 1 tablet by mouth  daily 90 tablet 3  . levothyroxine (SYNTHROID, LEVOTHROID) 50 MCG tablet Take 1 tablet by mouth  daily before breakfast 90 tablet 1  . Multiple Vitamins-Minerals (MENS 50+ MULTI VITAMIN/MIN) TABS Take 1 tablet by mouth daily.     . multivitamin-lutein (OCUVITE-LUTEIN) CAPS Take 1 capsule by mouth daily.      Marland Kitchen NITROSTAT 0.4 MG SL tablet Dissolve one tablet under  the tongue every 5 minutes  as needed for chest pain  (up to 3 tabs in 15 minutes then call 911) 75 tablet 0  . NON FORMULARY at bedtime. CPAP And during the day as needed    . sacubitril-valsartan (ENTRESTO) 97-103 MG Take 1 tablet by mouth 2 (two) times daily. 60 tablet 6  . spironolactone (ALDACTONE) 25 MG tablet Take 1 tablet by mouth  daily 90 tablet 3   Current Facility-Administered Medications on File Prior to Visit  Medication Dose Route Frequency Provider Last Rate Last Dose  . ipratropium-albuterol (DUONEB) 0.5-2.5 (3) MG/3ML nebulizer solution 3 mL  3 mL Nebulization Once [name], MD       Allergies  Allergen Reactions  . Tussionex Pennkinetic Er [Hydrocod Polst-Cpm Polst Er] Other (See Comments)    "caused prostate problems"  . Ace Inhibitors Other (See Comments)    REACTION: cough     ROS: See HPI for pertinent positives and negatives.  Physical Examination  Vitals:   10/06/16 1022 10/06/16 1031  BP: (!) 90/53 (!) 88/50  Pulse: 66 66  Resp: 16   Temp: 97.4 F (36.3 C)   Weight: 226 lb (102.5 kg)   Height: '5\' 8"'$  (1.727 m)    Body mass index is 34.36 kg/m.  General: A&O x 3, WD, obese male  Pulmonary: Sym exp, respirations are non labored, good air movt, CTAB, no rales, rhonchi, & wheezing  Cardiac: RRR, Nl S1, S2, no detected murmur  Vascular: Vessel Right Left  Radial Palpable Palpable  Brachial Palpable Palpable  Carotid Palpable, without bruit Palpable, without bruit  Aorta Not palpable [name]  Femoral Palpable Palpable  Popliteal  Not palpable Not  palpable  PT Not Palpable Not Palpable  DP  Palpable Not Palpable   Gastrointestinal: soft, NTND, no G/R, no HSM, no palpable masses, no CVAT B, large panus  Musculoskeletal: M/S 5/5 throughout , Extremities without ischemic changes   Neurologic: Pain and light touch intact in extremities , Motor exam as listed above     CTA Abd/Pelvis Duplex (Date: 02/19/15) The excluded native abdominal aortic aneurysm sac is unchanged to minimally decreased in size since preprocedural examination performed 10/2013, currently measuring 4.8 cm in maximal oblique short axis axial diameter (image 114, series 5, previously, 4.9 cm, approximately 4.5 cm in maximal oblique coronal dimension (coronal image 89, series 601), previously, 4.9 cm an approximately 4.8 cm in maximal oblique sagittal dimension (image 103, series 602, previously, 5.1 cm.  Review of the precontrast images demonstrates a linear calcification within the native abdominal aortic aneurysm sac (representative image 23, series 2). There is no enhancement of the excluded native abdominal aortic aneurysm sac to suggest the presence of an endoleak. No periaortic stranding  .1 The left superficial femoral artery is occluded throughout its imaged course - this represents an interval change since the 02/13/2014 examination. 2. Stable sequela of endovascular repair of infrarenal abdominal aortic aneurysm without evidence of complication. Minimal (approximately 3-4 mm) decrease in size of the now excluded native abdominal aortic aneurysm sac. No evidence of endoleak. 3. Unchanged moderate narrowing (approximately 50%) of the left iliac limb of the stent graft secondary to mass effect from the overlying right iliac limb, unchanged since the 02/2014 examination. 4. Suspected hemodynamically significant narrowing involving the origin of the right superficial femoral artery. 5. Left renal arterial stent appears widely  patent.   Non-Invasive Vascular Imaging  EVAR Duplex (Date: 10/06/16)  AAA sac size: 5.09 cm x 5.58 cm; right CIA: 1.76 cm; Left CIA: 1.76 cm  ? endoleak detected   Limited visualization of the abdominal vasculature due to overlying bowel gas and patient body habitus  10/01/15: 4.82 cm x 4.91 cm    Medical Decision Making  [name] is a 70 y.o. male who presents s/p EVAR (Date:  01/06/14).Pt is asymptomatic with an increase in sac size to 5.58 cm from 4.91 cm on 10/01/15. Largest diameter of AAA sac on CTA (02/19/15) was 5.1 cm with no endoleak.   He is also S/p L CEA, known RICA occlusion, Known L SFA occlusion.  Carotid duplex today conforms known occlusion of the right ICA. Patent left CEA site with no restenosis. Bilateral vertebral arteries are antegrade. Bilateral subclavian arteries are multiphasic. No significant change compared to last exam on 10/01/15.  ABI's today: right remains normal (1.04) with bi and monophasic waveforms;TBI remains normal. Left has declined form 0.87 to 0.77 with bi and monophasic waveforms; however, TBI is normal at 1.04   I discussed with the patient the importance of surveillance of the endograft.   Dr. Donzetta Matters reviewed pt's abdominal duplex from today.  The next EVAR CTA will be scheduled within the next month, see Dr. Bridgett Larsson afterward.  Additionally, he will need B carotid duplex and BLE ABI in 12 months.  Thank you for allowing [place] to participate in this patient's care. Clemon Chambers, RN, MSN, FNP-C [facility] [place] Office: [phone]  Clinic Physician: Donzetta Matters  10/06/2016, 10:42 AM

## 2016-10-06 NOTE — Patient Instructions (Signed)
Stroke Prevention Some medical conditions and behaviors are associated with an increased chance of having a stroke. You may prevent a stroke by making healthy choices and managing medical conditions. HOW CAN I REDUCE MY RISK OF HAVING A STROKE?   Stay physically active. Get at least 30 minutes of activity on most or all days.  Do not smoke. It may also be helpful to avoid exposure to secondhand smoke.  Limit alcohol use. Moderate alcohol use is considered to be:  No more than 2 drinks per day for men.  No more than 1 drink per day for nonpregnant women.  Eat healthy foods. This involves:  Eating 5 or more servings of fruits and vegetables a day.  Making dietary changes that address high blood pressure (hypertension), high cholesterol, diabetes, or obesity.  Manage your cholesterol levels.  Making food choices that are high in fiber and low in saturated fat, trans fat, and cholesterol may control cholesterol levels.  Take any prescribed medicines to control cholesterol as directed by your health care provider.  Manage your diabetes.  Controlling your carbohydrate and sugar intake is recommended to manage diabetes.  Take any prescribed medicines to control diabetes as directed by your health care provider.  Control your hypertension.  Making food choices that are low in salt (sodium), saturated fat, trans fat, and cholesterol is recommended to manage hypertension.  Ask your health care provider if you need treatment to lower your blood pressure. Take any prescribed medicines to control hypertension as directed by your health care provider.  If you are 18-39 years of age, have your blood pressure checked every 3-5 years. If you are 40 years of age or older, have your blood pressure checked every year.  Maintain a healthy weight.  Reducing calorie intake and making food choices that are low in sodium, saturated fat, trans fat, and cholesterol are recommended to manage  weight.  Stop drug abuse.  Avoid taking birth control pills.  Talk to your health care provider about the risks of taking birth control pills if you are over 35 years old, smoke, get migraines, or have ever had a blood clot.  Get evaluated for sleep disorders (sleep apnea).  Talk to your health care provider about getting a sleep evaluation if you snore a lot or have excessive sleepiness.  Take medicines only as directed by your health care provider.  For some people, aspirin or blood thinners (anticoagulants) are helpful in reducing the risk of forming abnormal blood clots that can lead to stroke. If you have the irregular heart rhythm of atrial fibrillation, you should be on a blood thinner unless there is a good reason you cannot take them.  Understand all your medicine instructions.  Make sure that other conditions (such as anemia or atherosclerosis) are addressed. SEEK IMMEDIATE MEDICAL CARE IF:   You have sudden weakness or numbness of the face, arm, or leg, especially on one side of the body.  Your face or eyelid droops to one side.  You have sudden confusion.  You have trouble speaking (aphasia) or understanding.  You have sudden trouble seeing in one or both eyes.  You have sudden trouble walking.  You have dizziness.  You have a loss of balance or coordination.  You have a sudden, severe headache with no known cause.  You have new chest pain or an irregular heartbeat. Any of these symptoms may represent a serious problem that is an emergency. Do not wait to see if the symptoms will   go away. Get medical help at once. Call your local emergency services (911 in [place] Do not drive yourself to the hospital.   This information is not intended to replace advice given to you by your health care provider. Make sure you discuss any questions you have with your health care provider.   Document Released: 01/04/2005 Document Revised: 12/18/2014 Document Reviewed:  05/30/2013 Elsevier Interactive Patient Education 2016 Elsevier Inc.    Peripheral Vascular Disease Peripheral vascular disease (PVD) is a disease of the blood vessels that are not part of your heart and brain. A simple term for PVD is poor circulation. In most cases, PVD narrows the blood vessels that carry blood from your heart to the rest of your body. This can result in a decreased supply of blood to your arms, legs, and internal organs, like your stomach or kidneys. However, it most often affects a person's lower legs and feet. There are two types of PVD.  Organic PVD. This is the more common type. It is caused by damage to the structure of blood vessels.  Functional PVD. This is caused by conditions that make blood vessels contract and tighten (spasm). Without treatment, PVD tends to get worse over time. PVD can also lead to acute ischemic limb. This is when an arm or limb suddenly has trouble getting enough blood. This is a medical emergency. CAUSES Each type of PVD has many different causes. The most common cause of PVD is buildup of a fatty material (plaque) inside of your arteries (atherosclerosis). Small amounts of plaque can break off from the walls of the blood vessels and become lodged in a smaller artery. This blocks blood flow and can cause acute ischemic limb. Other common causes of PVD include:  Blood clots that form inside of blood vessels.  Injuries to blood vessels.  Diseases that cause inflammation of blood vessels or cause blood vessel spasms.  Health behaviors and health history that increase your risk of developing PVD. RISK FACTORS  You may have a greater risk of PVD if you:  Have a family history of PVD.  Have certain medical conditions, including:  High cholesterol.  Diabetes.  High blood pressure (hypertension).  Coronary heart disease.  Past problems with blood clots.  Past injury, such as burns or a broken bone. These may have damaged blood  vessels in your limbs.  Buerger disease. This is caused by inflamed blood vessels in your hands and feet.  Some forms of arthritis.  Rare birth defects that affect the arteries in your legs.  Use tobacco.  Do not get enough exercise.  Are obese.  Are age 50 or older. SIGNS AND SYMPTOMS  PVD may cause many different symptoms. Your symptoms depend on what part of your body is not getting enough blood. Some common signs and symptoms include:  Cramps in your lower legs. This may be a symptom of poor leg circulation (claudication).  Pain and weakness in your legs while you are physically active that goes away when you rest (intermittent claudication).  Leg pain when at rest.  Leg numbness, tingling, or weakness.  Coldness in a leg or foot, especially when compared with the other leg.  Skin or hair changes. These can include:  Hair loss.  Shiny skin.  Pale or bluish skin.  Thick toenails.  Inability to get or maintain an erection (erectile dysfunction). People with PVD are more prone to developing ulcers and sores on their toes, feet, or legs. These may take longer than   normal to heal. DIAGNOSIS Your health care provider may diagnose PVD from your signs and symptoms. The health care provider will also do a physical exam. You may have tests to find out what is causing your PVD and determine its severity. Tests may include:  Blood pressure recordings from your arms and legs and measurements of the strength of your pulses (pulse volume recordings).  Imaging studies using sound waves to take pictures of the blood flow through your blood vessels (Doppler ultrasound).  Injecting a dye into your blood vessels before having imaging studies using:  X-rays (angiogram or arteriogram).  Computer-generated X-rays (CT angiogram).  A powerful electromagnetic field and a computer (magnetic resonance angiogram or MRA). TREATMENT Treatment for PVD depends on the cause of your condition  and the severity of your symptoms. It also depends on your age. Underlying causes need to be treated and controlled. These include long-lasting (chronic) conditions, such as diabetes, high cholesterol, and high blood pressure. You may need to first try making lifestyle changes and taking medicines. Surgery may be needed if these do not work. Lifestyle changes may include:  Quitting smoking.  Exercising regularly.  Following a low-fat, low-cholesterol diet. Medicines may include:  Blood thinners to prevent blood clots.  Medicines to improve blood flow.  Medicines to improve your blood cholesterol levels. Surgical procedures may include:  A procedure that uses an inflated balloon to open a blocked artery and improve blood flow (angioplasty).  A procedure to put in a tube (stent) to keep a blocked artery open (stent implant).  Surgery to reroute blood flow around a blocked artery (peripheral bypass surgery).  Surgery to remove dead tissue from an infected wound on the affected limb.  Amputation. This is surgical removal of the affected limb. This may be necessary in cases of acute ischemic limb that are not improved through medical or surgical treatments. HOME CARE INSTRUCTIONS  Take medicines only as directed by your health care provider.  Do not use any tobacco products, including cigarettes, chewing tobacco, or electronic cigarettes. If you need help quitting, ask your health care provider.  Lose weight if you are overweight, and maintain a healthy weight as directed by your health care provider.  Eat a diet that is low in fat and cholesterol. If you need help, ask your health care provider.  Exercise regularly. Ask your health care provider to suggest some good activities for you.  Use compression stockings or other mechanical devices as directed by your health care provider.  Take good care of your feet.  Wear comfortable shoes that fit well.  Check your feet often for  any cuts or sores. SEEK MEDICAL CARE IF:  You have cramps in your legs while walking.  You have leg pain when you are at rest.  You have coldness in a leg or foot.  Your skin changes.  You have erectile dysfunction.  You have cuts or sores on your feet that are not healing. SEEK IMMEDIATE MEDICAL CARE IF:  Your arm or leg turns cold and blue.  Your arms or legs become red, warm, swollen, painful, or numb.  You have chest pain or trouble breathing.  You suddenly have weakness in your face, arm, or leg.  You become very confused or lose the ability to speak.  You suddenly have a very bad headache or lose your vision.   This information is not intended to replace advice given to you by your health care provider. Make sure you discuss any questions   you have with your health care provider.   Document Released: 01/04/2005 Document Revised: 12/18/2014 Document Reviewed: 05/07/2014 Elsevier Interactive Patient Education 2016 Elsevier Inc.  

## 2016-10-17 ENCOUNTER — Other Ambulatory Visit: Payer: Self-pay | Admitting: Internal Medicine

## 2016-10-17 ENCOUNTER — Other Ambulatory Visit: Payer: Self-pay | Admitting: Cardiology

## 2016-10-20 ENCOUNTER — Ambulatory Visit (INDEPENDENT_AMBULATORY_CARE_PROVIDER_SITE_OTHER): Payer: Medicare Other | Admitting: Nurse Practitioner

## 2016-10-20 ENCOUNTER — Encounter: Payer: Self-pay | Admitting: Nurse Practitioner

## 2016-10-20 ENCOUNTER — Encounter (INDEPENDENT_AMBULATORY_CARE_PROVIDER_SITE_OTHER): Payer: Medicare Other | Admitting: Internal Medicine

## 2016-10-20 VITALS — BP 98/74 | Temp 97.6°F | Ht 68.0 in | Wt 226.0 lb

## 2016-10-20 DIAGNOSIS — Z95810 Presence of automatic (implantable) cardiac defibrillator: Secondary | ICD-10-CM | POA: Diagnosis not present

## 2016-10-20 DIAGNOSIS — J069 Acute upper respiratory infection, unspecified: Secondary | ICD-10-CM

## 2016-10-20 DIAGNOSIS — I779 Disorder of arteries and arterioles, unspecified: Secondary | ICD-10-CM

## 2016-10-20 MED ORDER — BENZONATATE 100 MG PO CAPS: 100.0000 mg | ORAL_CAPSULE | Freq: Three times a day (TID) | ORAL | 0 refills | Status: DC | PRN

## 2016-10-20 MED ORDER — IPRATROPIUM BROMIDE 0.03 % NA SOLN: 2.0000 | Freq: Two times a day (BID) | NASAL | 0 refills | Status: DC

## 2016-10-20 MED ORDER — GUAIFENESIN-DM 100-10 MG/5ML PO SYRP: 5.0000 mL | ORAL_SOLUTION | ORAL | 0 refills | Status: DC | PRN

## 2016-10-20 MED ORDER — METHYLPREDNISOLONE ACETATE 40 MG/ML IJ SUSP
40.0000 mg | Freq: Once | INTRAMUSCULAR | Status: AC
  Administered 2016-10-20: 40 mg via INTRAMUSCULAR

## 2016-10-20 NOTE — Progress Notes (Signed)
Pre visit review using our clinic review tool, if applicable. No additional management support is needed unless otherwise documented below in the visit note. 

## 2016-10-20 NOTE — Patient Instructions (Addendum)
Use atrovent nasal spray as prescribed Use albuterol inhaler as prescribed Encourage adequate oral hydration May use chloraseptic lozenges as needed for sore throat.  Call office for oral abx if no improvement by Monday.

## 2016-10-20 NOTE — Progress Notes (Signed)
Subjective:  Patient ID: [name], male    DOB: 1946-06-21  Age: 70 y.o. MRN: [id number]  CC: Cough (Pt stated cold, coughing, mucous since last night.)   Cough  This is a new problem. The current episode started yesterday. The problem has been unchanged. The cough is productive of sputum. Associated symptoms include nasal congestion, postnasal drip, rhinorrhea and shortness of breath. Pertinent negatives include no chest pain, chills, fever, heartburn, hemoptysis or weight loss. The symptoms are aggravated by lying down. He has tried nothing for the symptoms. His past medical history is significant for bronchitis, COPD and environmental allergies.    Outpatient Medications Prior to Visit  Medication Sig Dispense Refill  . albuterol (PROVENTIL HFA;VENTOLIN HFA) 108 (90 BASE) MCG/ACT inhaler Inhale 2 puffs into the lungs every 6 (six) hours as needed for wheezing or shortness of breath. 3 Inhaler 4  . albuterol (PROVENTIL) (2.5 MG/3ML) 0.083% nebulizer solution Take 3 mLs (2.5 mg total) by nebulization every 6 (six) hours. 360 mL 6  . amiodarone (PACERONE) 200 MG tablet TAKE 1 TABLET BY MOUTH  DAILY 90 tablet 3  . aspirin 81 MG tablet Take 81 mg by mouth daily.    Marland Kitchen atorvastatin (LIPITOR) 40 MG tablet TAKE 1 TABLET BY MOUTH  DAILY 90 tablet 3  . carvedilol (COREG) 25 MG tablet TAKE 1 TABLET BY MOUTH  TWICE A DAY WITH MEALS 180 tablet 3  . clopidogrel (PLAVIX) 75 MG tablet TAKE 1 TABLET BY MOUTH  DAILY 90 tablet 3  . finasteride (PROSCAR) 5 MG tablet Take 5 mg by mouth daily.    . fluticasone (FLONASE) 50 MCG/ACT nasal spray Instill 1 spray in each  nostril daily 32 g 3  . furosemide (LASIX) 80 MG tablet Take 1 tablet by mouth two  times daily 180 tablet 3  . Glycopyrrolate-Formoterol (BEVESPI AEROSPHERE) 9-4.8 MCG/ACT AERO Inhale 2 puffs into the lungs 2 (two) times daily. 2 Inhaler 0  . ipratropium (ATROVENT HFA) 17 MCG/ACT inhaler Inhale 2 puffs into the lungs every 4 (four) hours as  needed for wheezing. 1 Inhaler 12  . isosorbide mononitrate (IMDUR) 60 MG 24 hr tablet Take 1 tablet by mouth  daily 90 tablet 3  . levothyroxine (SYNTHROID, LEVOTHROID) 50 MCG tablet TAKE 1 TABLET BY MOUTH  DAILY BEFORE BREAKFAST 90 tablet 1  . Multiple Vitamins-Minerals (MENS 50+ MULTI VITAMIN/MIN) TABS Take 1 tablet by mouth daily.     . multivitamin-lutein (OCUVITE-LUTEIN) CAPS Take 1 capsule by mouth daily.      Marland Kitchen NITROSTAT 0.4 MG SL tablet Dissolve one tablet under  the tongue every 5 minutes  as needed for chest pain  (up to 3 tabs in 15 minutes then call 911) 75 tablet 0  . NON FORMULARY at bedtime. CPAP And during the day as needed    . sacubitril-valsartan (ENTRESTO) 97-103 MG Take 1 tablet by mouth 2 (two) times daily. 60 tablet 6  . spironolactone (ALDACTONE) 25 MG tablet Take 1 tablet by mouth  daily 90 tablet 3   Facility-Administered Medications Prior to Visit  Medication Dose Route Frequency Provider Last Rate Last Dose  . ipratropium-albuterol (DUONEB) 0.5-2.5 (3) MG/3ML nebulizer solution 3 mL  3 mL Nebulization Once [name], MD        ROS See HPI  Objective:  BP 98/74 (BP Location: Left Arm, Patient Position: Sitting, Cuff Size: Normal)   Temp 97.6 F (36.4 C)   Ht '5\' 8"'$  (1.727 m)  Wt 226 lb (102.5 kg)   SpO2 98%   BMI 34.36 kg/m   BP Readings from Last 3 Encounters:  10/20/16 98/74  10/06/16 (!) 88/50  08/18/16 114/72    Wt Readings from Last 3 Encounters:  10/20/16 226 lb (102.5 kg)  10/06/16 226 lb (102.5 kg)  08/18/16 231 lb 1.9 oz (104.8 kg)    Physical Exam  Constitutional: He is oriented to person, place, and time. No distress.  HENT:  Right Ear: Tympanic membrane, external ear and ear canal normal.  Left Ear: Tympanic membrane and ear canal normal.  Nose: Mucosal edema and rhinorrhea present. Right sinus exhibits no maxillary sinus tenderness and no frontal sinus tenderness. Left sinus exhibits no maxillary sinus tenderness and no  frontal sinus tenderness.  Mouth/Throat: Uvula is midline. No trismus in the jaw. Posterior oropharyngeal erythema present. No oropharyngeal exudate.  Eyes: No scleral icterus.  Neck: Normal range of motion. Neck supple.  Cardiovascular: Normal rate and regular rhythm.   Pulmonary/Chest: Effort normal. No respiratory distress. He has no wheezes. He has no rales. He exhibits no tenderness.  Diminished in bilateral lower lobes  Musculoskeletal: Normal range of motion. He exhibits no edema.  Lymphadenopathy:    He has no cervical adenopathy.  Neurological: He is alert and oriented to person, place, and time.  Skin: Skin is warm and dry.  Vitals reviewed.   Lab Results  Component Value Date   WBC 4.5 06/20/2016   HGB 15.2 06/20/2016   HCT 46.5 06/20/2016   PLT 130 (L) 06/20/2016   GLUCOSE 102 (H) 08/18/2016   CHOL 127 08/18/2016   TRIG 144 08/18/2016   HDL 29 (L) 08/18/2016   LDLCALC 69 08/18/2016   ALT 20 08/18/2016   AST 18 08/18/2016   NA 142 08/18/2016   K 3.9 08/18/2016   CL 105 08/18/2016   CREATININE 0.79 08/18/2016   BUN 22 (H) 08/18/2016   CO2 29 08/18/2016   TSH 4.042 08/18/2016   PSA 0.51 04/18/2012   INR 1.0 10/05/2014   HGBA1C 6.3 02/17/2015    No results found.  Assessment & Plan:   Andree was seen today for cough.  Diagnoses and all orders for this visit:  URI, acute -     methylPREDNISolone acetate (DEPO-MEDROL) injection 40 mg; Inject 1 mL (40 mg total) into the muscle once. -     guaiFENesin-dextromethorphan (ROBITUSSIN DM) 100-10 MG/5ML syrup; Take 5 mLs by mouth every 4 (four) hours as needed for cough. -     benzonatate (TESSALON) 100 MG capsule; Take 1 capsule (100 mg total) by mouth 3 (three) times daily as needed for cough. -     ipratropium (ATROVENT) 0.03 % nasal spray; Place 2 sprays into both nostrils 2 (two) times daily. Do not use for more than 5days.   I am having [name] start on guaiFENesin-dextromethorphan, benzonatate, and  ipratropium. I am also having him maintain his multivitamin-lutein, aspirin, finasteride, MENS 50+ MULTI VITAMIN/MIN, NON FORMULARY, albuterol, albuterol, furosemide, isosorbide mononitrate, ipratropium, Glycopyrrolate-Formoterol, sacubitril-valsartan, fluticasone, spironolactone, NITROSTAT, levothyroxine, amiodarone, atorvastatin, carvedilol, and clopidogrel. We administered methylPREDNISolone acetate. We will continue to administer ipratropium-albuterol.  Meds ordered this encounter  Medications  . methylPREDNISolone acetate (DEPO-MEDROL) injection 40 mg  . guaiFENesin-dextromethorphan (ROBITUSSIN DM) 100-10 MG/5ML syrup    Sig: Take 5 mLs by mouth every 4 (four) hours as needed for cough.    Dispense:  118 mL    Refill:  0    Order Specific Question:  Supervising Provider    Answer:   Cassandria Anger [[id number]]  . benzonatate (TESSALON) 100 MG capsule    Sig: Take 1 capsule (100 mg total) by mouth 3 (three) times daily as needed for cough.    Dispense:  20 capsule    Refill:  0    Order Specific Question:   Supervising Provider    Answer:   Cassandria Anger [[id number]]  . ipratropium (ATROVENT) 0.03 % nasal spray    Sig: Place 2 sprays into both nostrils 2 (two) times daily. Do not use for more than 5days.    Dispense:  30 mL    Refill:  0    Order Specific Question:   Supervising Provider    Answer:   Cassandria Anger [[id number]]    Follow-up: Return if symptoms worsen or fail to improve.  Wilfred Lacy, NP

## 2016-10-23 ENCOUNTER — Telehealth: Payer: Self-pay | Admitting: Emergency Medicine

## 2016-10-23 ENCOUNTER — Other Ambulatory Visit: Payer: Self-pay | Admitting: Cardiovascular Disease

## 2016-10-23 MED ORDER — DOXYCYCLINE HYCLATE 100 MG PO TABS: 100.0000 mg | ORAL_TABLET | Freq: Two times a day (BID) | ORAL | 0 refills | Status: DC

## 2016-10-23 NOTE — Addendum Note (Signed)
Addended by: Mauricio Po D on: 10/23/2016 02:47 PM   Modules accepted: Orders

## 2016-10-23 NOTE — Telephone Encounter (Signed)
Left message advising patient that abx has been sent to [facility]

## 2016-10-23 NOTE — Telephone Encounter (Signed)
Routing to greg----in charlottes office visit note, she states she will have patient call back today if no better for oral abx---routing to greg, are you ok with calling in abx for patient?--please advise, thanks

## 2016-10-23 NOTE — Telephone Encounter (Signed)
Doxycycline sent to pharmacy

## 2016-10-23 NOTE — Telephone Encounter (Signed)
Pt was told to call back if he wasn't better by today and Baldo Ash would write him a prescription. Informed pt that she was out of the office today so it would be tomorrow before she would get the message. Please advise thanks.

## 2016-11-07 ENCOUNTER — Encounter: Payer: Self-pay | Admitting: Thoracic Surgery (Cardiothoracic Vascular Surgery)

## 2016-11-07 ENCOUNTER — Ambulatory Visit
Admission: RE | Admit: 2016-11-07 | Discharge: 2016-11-07 | Disposition: A | Discharge status: Home / Self Care | Payer: Medicare Other | Source: Ambulatory Visit | Attending: Thoracic Surgery (Cardiothoracic Vascular Surgery) | Admitting: Thoracic Surgery (Cardiothoracic Vascular Surgery)

## 2016-11-07 ENCOUNTER — Ambulatory Visit (INDEPENDENT_AMBULATORY_CARE_PROVIDER_SITE_OTHER): Payer: Medicare Other | Admitting: Thoracic Surgery (Cardiothoracic Vascular Surgery)

## 2016-11-07 VITALS — BP 114/58 | HR 68 | Resp 20 | Ht 68.0 in | Wt 226.0 lb

## 2016-11-07 DIAGNOSIS — R911 Solitary pulmonary nodule: Secondary | ICD-10-CM | POA: Diagnosis not present

## 2016-11-07 DIAGNOSIS — I779 Disorder of arteries and arterioles, unspecified: Secondary | ICD-10-CM | POA: Diagnosis not present

## 2016-11-07 NOTE — Progress Notes (Signed)
[address]       [place], [address]             [phone]     HPI: Mr. Nigh returns today for follow-up of a right upper lobe nodule.  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 His PMH is complicated and includes CHF, CAD, s/ CABG, s/p multiple stents, VT, ICD, PVD, hypertension, hyperlipidemia, severe COPD, morbid obesity, sleep apnea, and an AAA.   I first saw him a year ago regarding a groundglass opacity in the right upper lobe. He had had a CT done for follow-up of an aortic endograft. There was an incidentally noted a groundglass nodule centrally in the right upper lobe. There was no hilar or mediastinal adenopathy. We have been following him with CT scans since that time. I last saw him in May. The groundglass opacity was unchanged, but there is a question of a new 5 mm nodular opacity just anterior to the groundglass opacity.  He says he feels well. He has been losing weight through dieting. His exercise tolerance has improved although he still has significant dyspnea. At times he is even dyspneic at rest. He has COPD and CHF. His CHF seems to be the most limiting factor. He gets SOB with walking < 100 feet. He denies cough, hemoptysis, wheezing, orthopnea and PND.   Past Medical History:  Diagnosis Date  . AAA (abdominal aortic aneurysm) ([place])    followed by Dr. Bridgett Larsson  . Adenomatous colon polyp 01/2004  . Anemia    hx  . Automatic implantable cardioverter-defibrillator in situ   . AVM (arteriovenous malformation)   . BPH (benign prostatic hypertrophy)   . CAD (coronary artery disease)    s/p CABGx2 in 1996  . Carotid artery stenosis    LCEA - Dr. Bridgett Larsson in 2013  . CHF (congestive heart failure) ([place])   . Complication of anesthesia    claustrophobic, unabe to lie on back more than 4 hours at time due to back  . COPD (chronic obstructive pulmonary disease) ([place])   . Diabetes mellitus    DIET CONTROLLED- pt  states that this was a misdiagnosis, he was treated while in the hospital  but returned home, loss a massive amount of weight and he has not had a problem with his blood sugar since. States everything has been normal for about 3 years.  . Diverticulosis   . Dysrhythmia    ICD-defibrillator  . Fatigue   . GERD (gastroesophageal reflux disease)   . H/O hiatal hernia   . HLD (hyperlipidemia)   . Hypertension   . Hypothyroidism   . Ischemic cardiomyopathy   . Myocardial infarction   . OSA on CPAP    AHI durign total sleep 14.69/hr, during REM 50.91/hr  . Peripheral vascular disease ([facility])    LCEA, L renal artery stent, bilat iliac stents, R SFA stenosis  . Tremor   . Ventricular tachycardia ([place]) 09/09/2014   Amiodarone was started after appropriate defibrillator shocks for ventricular tachycardia in October 2008      Current Outpatient Prescriptions  Medication Sig Dispense Refill  . albuterol (PROVENTIL HFA;VENTOLIN HFA) 108 (90 BASE) MCG/ACT inhaler Inhale 2 puffs into the lungs every 6 (six) hours as needed for wheezing or shortness of breath. 3 Inhaler 4  . albuterol (PROVENTIL) (2.5 MG/3ML) 0.083% nebulizer solution Take 3 mLs (2.5 mg total) by nebulization every 6 (six)  hours. 360 mL 6  . amiodarone (PACERONE) 200 MG tablet TAKE 1 TABLET BY MOUTH  DAILY 90 tablet 3  . aspirin 81 MG tablet Take 81 mg by mouth daily.    Marland Kitchen atorvastatin (LIPITOR) 40 MG tablet TAKE 1 TABLET BY MOUTH  DAILY 90 tablet 3  . benzonatate (TESSALON) 100 MG capsule Take 1 capsule (100 mg total) by mouth 3 (three) times daily as needed for cough. 20 capsule 0  . carvedilol (COREG) 25 MG tablet TAKE 1 TABLET BY MOUTH  TWICE A DAY WITH MEALS 180 tablet 3  . clopidogrel (PLAVIX) 75 MG tablet TAKE 1 TABLET BY MOUTH  DAILY 90 tablet 3  . doxycycline (VIBRA-TABS) 100 MG tablet Take 1 tablet (100 mg total) by mouth 2 (two) times daily. 20 tablet 0  . finasteride (PROSCAR) 5 MG tablet Take 5 mg by mouth daily.    .  fluticasone (FLONASE) 50 MCG/ACT nasal spray Instill 1 spray in each  nostril daily 32 g 3  . furosemide (LASIX) 80 MG tablet Take 1 tablet by mouth two  times daily 180 tablet 3  . Glycopyrrolate-Formoterol (BEVESPI AEROSPHERE) 9-4.8 MCG/ACT AERO Inhale 2 puffs into the lungs 2 (two) times daily. 2 Inhaler 0  . guaiFENesin-dextromethorphan (ROBITUSSIN DM) 100-10 MG/5ML syrup Take 5 mLs by mouth every 4 (four) hours as needed for cough. 118 mL 0  . ipratropium (ATROVENT HFA) 17 MCG/ACT inhaler Inhale 2 puffs into the lungs every 4 (four) hours as needed for wheezing. 1 Inhaler 12  . ipratropium (ATROVENT) 0.03 % nasal spray Place 2 sprays into both nostrils 2 (two) times daily. Do not use for more than 5days. 30 mL 0  . isosorbide mononitrate (IMDUR) 60 MG 24 hr tablet Take 1 tablet by mouth  daily 90 tablet 3  . levothyroxine (SYNTHROID, LEVOTHROID) 50 MCG tablet TAKE 1 TABLET BY MOUTH  DAILY BEFORE BREAKFAST 90 tablet 1  . Multiple Vitamins-Minerals (MENS 50+ MULTI VITAMIN/MIN) TABS Take 1 tablet by mouth daily.     . multivitamin-lutein (OCUVITE-LUTEIN) CAPS Take 1 capsule by mouth daily.      . nitroGLYCERIN (NITROSTAT) 0.4 MG SL tablet DISSOLVE ONE TABLET UNDER  THE TONGUE EVERY 5 MINUTES  AS NEEDED FOR CHEST PAIN  (UP TO 3 TABS IN 15 MINUTES THEN CALL 911) 75 tablet 0  . NON FORMULARY at bedtime. CPAP And during the day as needed    . sacubitril-valsartan (ENTRESTO) 97-103 MG Take 1 tablet by mouth 2 (two) times daily. 60 tablet 6  . spironolactone (ALDACTONE) 25 MG tablet Take 1 tablet by mouth  daily 90 tablet 3   Current Facility-Administered Medications  Medication Dose Route Frequency Provider Last Rate Last Dose  . ipratropium-albuterol (DUONEB) 0.5-2.5 (3) MG/3ML nebulizer solution 3 mL  3 mL Nebulization Once [name], MD        Physical Exam BP (!) 114/58   Pulse 68   Resp 20   Ht '5\' 8"'$  (1.727 m)   Wt 226 lb (102.5 kg)   SpO2 96% Comment: RA  BMI 34.36 kg/m  Obese  70 year old man in no acute distress Alert and oriented 3 with no focal deficits No cervical or supraclavicular adenopathy Cardiac regular rate and rhythm with a 1/6 systolic murmur, question S4 Lungs diminished breath sounds bilaterally, no wheezing  Diagnostic Tests: CT CHEST WITHOUT CONTRAST  TECHNIQUE: Multidetector CT imaging of the chest was performed following the standard protocol without IV contrast.  COMPARISON:  05/02/2016  FINDINGS: Cardiovascular: There is moderate cardiac enlargement identified. Aortic atherosclerosis. Previous median sternotomy and CABG procedure.  Mediastinum/Nodes: No enlarged mediastinal or axillary lymph nodes. Thyroid gland, trachea, and esophagus demonstrate no significant findings.  Lungs/Pleura: No pleural effusions identified. There is moderate changes of centrilobular emphysema. The index nodule within the right upper lobe has increased in size, currently 11 x 7.7 mm, mean diameter 9.5 mm, image 41 of series 4. On the previous exam this had a mean diameter of 5 mm. On the previous exam this measured 5 mm. This appears solid and has spiculated margins. Scar like density identified within the posterior medial right lower lobe. The peripheral tree-in-bud nodularity in the posterior left lower lobe is identified and is likely related to inflammatory or infectious bronchiolitis.  Upper Abdomen: No acute abnormality. Right renal calculus identified. Aortic stent graft is noted.  Musculoskeletal: No chest wall mass or suspicious bone lesions identified.  IMPRESSION: 1. There is been interval increase in size of right upper lobe nodule which now appears spiculated and is worrisome for small pulmonary neoplasm. This currently has a mean diameter of 9.5 mm. This appears separate from and distal to the previously followed irregular sub solid pulmonary nodule in the right upper lobe. 2. Emphysema 3. Aortic atherosclerosis.   Previous CABG procedure.   Electronically Signed   By: Kerby Moors M.D.   On: 11/07/2016 13:00 I personally reviewed the CT chest, and concur with the findings noted above.  Impression: 70 year old man with a history of tobacco abuse and COPD who was being followed by CT scan for a groundglass opacity. In the antrum there is been a small more solid nodule just distal to the original area. This was first noted 6 months ago at 5 mm in size. It now measures 7 x 11 mm. It is worrisome for a new primary bronchogenic carcinoma. However given its location adjacent to the groundglass opacity and area of bronchiectasis this could represent an infectious or inflammatory process as well.  I reviewed the CT images with Mr. and Mrs. Langone and we discussed the differential diagnosis. We discussed options for how to proceed. One option would be to do a navigational bronchoscopy. This would require general anesthesia. Is a relatively small lesions of the false negative rates can be relatively high. Overall think the chance of establishing a definitive diagnosis would be around 60%. The second option would be to continue radiographic follow-up, but with a shorter interval.  After discussing the relative pros and cons of each of those approaches Mr. Lucchesi would like to continue with radiographic follow-up. However, rather than 6 months we will plan to see him back and do a PET/CT in 3 months. If there is significant metabolic activity or any change in size then we will need to be more aggressive.  Congestive heart failure- symptoms currently stable on present regimen. Followed by [name]  COPD- stable on current regimen followed by [name]  Plan:  Return in 3 months with PET/CT  [name], MD [facility] [phone]

## 2016-11-21 ENCOUNTER — Encounter: Payer: Self-pay | Admitting: Internal Medicine

## 2016-11-21 ENCOUNTER — Ambulatory Visit (INDEPENDENT_AMBULATORY_CARE_PROVIDER_SITE_OTHER): Payer: Medicare Other | Admitting: Internal Medicine

## 2016-11-21 VITALS — BP 100/62 | HR 62 | Ht 68.0 in | Wt 228.6 lb

## 2016-11-21 DIAGNOSIS — Z95810 Presence of automatic (implantable) cardiac defibrillator: Secondary | ICD-10-CM

## 2016-11-21 DIAGNOSIS — I779 Disorder of arteries and arterioles, unspecified: Secondary | ICD-10-CM

## 2016-11-21 LAB — CUP PACEART INCLINIC DEVICE CHECK
Date Time Interrogation Session: 20171212050000
HighPow Impedance: 38 Ohm
HighPow Impedance: 49 Ohm
Implantable Lead Implant Date: 20060411
Implantable Lead Implant Date: 20151030
Implantable Lead Implant Date: 20151030
Implantable Lead Location: 753858
Implantable Lead Location: 753859
Implantable Lead Location: 753860
Implantable Lead Model: 148
Implantable Lead Model: 4136
Implantable Lead Serial Number: 145070
Implantable Lead Serial Number: 29713415
Implantable Pulse Generator Implant Date: 20151030
Lead Channel Impedance Value: 550 Ohm
Lead Channel Impedance Value: 615 Ohm
Lead Channel Impedance Value: 656 Ohm
Lead Channel Pacing Threshold Amplitude: 0.7 V
Lead Channel Pacing Threshold Amplitude: 0.7 V
Lead Channel Pacing Threshold Amplitude: 2 V
Lead Channel Pacing Threshold Pulse Width: 0.4 ms
Lead Channel Pacing Threshold Pulse Width: 0.4 ms
Lead Channel Pacing Threshold Pulse Width: 0.9 ms
Lead Channel Sensing Intrinsic Amplitude: 17.8 mV
Lead Channel Sensing Intrinsic Amplitude: 25 mV
Lead Channel Sensing Intrinsic Amplitude: 3.5 mV
Lead Channel Setting Pacing Amplitude: 2 V
Lead Channel Setting Pacing Amplitude: 2 V
Lead Channel Setting Pacing Amplitude: 3.5 V
Lead Channel Setting Pacing Pulse Width: 0.4 ms
Lead Channel Setting Pacing Pulse Width: 0.9 ms
Lead Channel Setting Sensing Sensitivity: 0.6 mV
Lead Channel Setting Sensing Sensitivity: 1 mV
Pulse Gen Serial Number: 370874

## 2016-11-21 NOTE — Progress Notes (Signed)
HPI Mr. Makarewicz returns today for ICD followup. He is a very pleasant 70 year old man with chronic systolic heart failure, class III, left bundle branch block, with a QRS duration of 135 ms, prior coronary artery disease and bypass grafting, with multiple remote stents status post percutaneous coronary intervention, ventricular tachycardia with VT storm in 2008, managed with chronic suppressive amiodarone therapy.  He admits to some dietary indiscretion, but he is trying to lose weight. He is s/p BiV ICD upgrade several months ago. He now has class 2 CHF (from class 3B). He feels better and is exercising daily and has been successful losing weight.  Allergies  Allergen Reactions  . Tussionex Pennkinetic Er [Hydrocod Polst-Cpm Polst Er] Other (See Comments)    "caused prostate problems"  . Ace Inhibitors Other (See Comments)    REACTION: cough     Current Outpatient Prescriptions  Medication Sig Dispense Refill  . albuterol (PROVENTIL HFA;VENTOLIN HFA) 108 (90 BASE) MCG/ACT inhaler Inhale 2 puffs into the lungs every 6 (six) hours as needed for wheezing or shortness of breath. 3 Inhaler 4  . albuterol (PROVENTIL) (2.5 MG/3ML) 0.083% nebulizer solution Take 3 mLs (2.5 mg total) by nebulization every 6 (six) hours. 360 mL 6  . amiodarone (PACERONE) 200 MG tablet TAKE 1 TABLET BY MOUTH  DAILY 90 tablet 3  . aspirin 81 MG tablet Take 81 mg by mouth daily.    Marland Kitchen atorvastatin (LIPITOR) 40 MG tablet TAKE 1 TABLET BY MOUTH  DAILY 90 tablet 3  . benzonatate (TESSALON) 100 MG capsule Take 1 capsule (100 mg total) by mouth 3 (three) times daily as needed for cough. 20 capsule 0  . carvedilol (COREG) 25 MG tablet TAKE 1 TABLET BY MOUTH  TWICE A DAY WITH MEALS 180 tablet 3  . clopidogrel (PLAVIX) 75 MG tablet TAKE 1 TABLET BY MOUTH  DAILY 90 tablet 3  . doxycycline (VIBRA-TABS) 100 MG tablet Take 1 tablet (100 mg total) by mouth 2 (two) times daily. 20 tablet 0  . finasteride (PROSCAR) 5 MG tablet Take 5  mg by mouth daily.    . fluticasone (FLONASE) 50 MCG/ACT nasal spray Instill 1 spray in each  nostril daily 32 g 3  . furosemide (LASIX) 80 MG tablet Take 1 tablet by mouth two  times daily 180 tablet 3  . Glycopyrrolate-Formoterol (BEVESPI AEROSPHERE) 9-4.8 MCG/ACT AERO Inhale 2 puffs into the lungs 2 (two) times daily. 2 Inhaler 0  . guaiFENesin-dextromethorphan (ROBITUSSIN DM) 100-10 MG/5ML syrup Take 5 mLs by mouth every 4 (four) hours as needed for cough. 118 mL 0  . ipratropium (ATROVENT HFA) 17 MCG/ACT inhaler Inhale 2 puffs into the lungs every 4 (four) hours as needed for wheezing. 1 Inhaler 12  . ipratropium (ATROVENT) 0.03 % nasal spray Place 2 sprays into both nostrils 2 (two) times daily. Do not use for more than 5days. 30 mL 0  . isosorbide mononitrate (IMDUR) 60 MG 24 hr tablet Take 1 tablet by mouth  daily 90 tablet 3  . levothyroxine (SYNTHROID, LEVOTHROID) 50 MCG tablet TAKE 1 TABLET BY MOUTH  DAILY BEFORE BREAKFAST 90 tablet 1  . Multiple Vitamins-Minerals (MENS 50+ MULTI VITAMIN/MIN) TABS Take 1 tablet by mouth daily.     . multivitamin-lutein (OCUVITE-LUTEIN) CAPS Take 1 capsule by mouth daily.      . nitroGLYCERIN (NITROSTAT) 0.4 MG SL tablet DISSOLVE ONE TABLET UNDER  THE TONGUE EVERY 5 MINUTES  AS NEEDED FOR CHEST PAIN  (UP TO 3 TABS IN  15 MINUTES THEN CALL 911) 75 tablet 0  . NON FORMULARY at bedtime. CPAP And during the day as needed    . sacubitril-valsartan (ENTRESTO) 97-103 MG Take 1 tablet by mouth 2 (two) times daily. 60 tablet 6  . spironolactone (ALDACTONE) 25 MG tablet Take 1 tablet by mouth  daily 90 tablet 3   Current Facility-Administered Medications  Medication Dose Route Frequency Provider Last Rate Last Dose  . ipratropium-albuterol (DUONEB) 0.5-2.5 (3) MG/3ML nebulizer solution 3 mL  3 mL Nebulization Once [name], MD         Past Medical History:  Diagnosis Date  . AAA (abdominal aortic aneurysm) ([place])    followed by Dr. Bridgett Larsson  . Adenomatous  colon polyp 01/2004  . Anemia    hx  . Automatic implantable cardioverter-defibrillator in situ   . AVM (arteriovenous malformation)   . BPH (benign prostatic hypertrophy)   . CAD (coronary artery disease)    s/p CABGx2 in 1996  . Carotid artery stenosis    LCEA - Dr. Bridgett Larsson in 2013  . CHF (congestive heart failure) ([place])   . Complication of anesthesia    claustrophobic, unabe to lie on back more than 4 hours at time due to back  . COPD (chronic obstructive pulmonary disease) ([place])   . Diabetes mellitus    DIET CONTROLLED- pt states that this was a misdiagnosis, he was treated while in the hospital  but returned home, loss a massive amount of weight and he has not had a problem with his blood sugar since. States everything has been normal for about 3 years.  . Diverticulosis   . Dysrhythmia    ICD-defibrillator  . Fatigue   . GERD (gastroesophageal reflux disease)   . H/O hiatal hernia   . HLD (hyperlipidemia)   . Hypertension   . Hypothyroidism   . Ischemic cardiomyopathy   . Myocardial infarction   . OSA on CPAP    AHI durign total sleep 14.69/hr, during REM 50.91/hr  . Peripheral vascular disease ([facility])    LCEA, L renal artery stent, bilat iliac stents, R SFA stenosis  . Tremor   . Ventricular tachycardia ([place]) 09/09/2014   Amiodarone was started after appropriate defibrillator shocks for ventricular tachycardia in October 2008    ROS:   All systems reviewed and negative except as noted in the HPI.   Past Surgical History:  Procedure Laterality Date  . ABDOMINAL AORTIC ENDOVASCULAR STENT GRAFT [name] 01/06/2014   Procedure: ABDOMINAL AORTIC ENDOVASCULAR STENT GRAFT- GORE; ULTRASOUND GUIDED;  Surgeon: Conrad Moravia, MD;  Location: [place] [facility];  Service: Vascular;  Laterality: [name];  . ANGIOPLASTY     BILATERAL  LE  W/STENTS  . BI-VENTRICULAR IMPLANTABLE CARDIOVERTER DEFIBRILLATOR UPGRADE [name] 10/09/2014   Procedure: BI-VENTRICULAR IMPLANTABLE CARDIOVERTER DEFIBRILLATOR UPGRADE;   Surgeon: Evans Lance, MD;  Location[facility] C[facility] LAB;  Service: Cardiovascular;  Laterality: [name];  . BIV ICD GENERTAOR CHANGE OUT  10/09/2014   UPGRADE TO BIV        BY DR Lovena Le  . 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 Adora Fridge)  . CARDIAC CATHETERIZATION  10/03/2002   SVG sequentially to OM1 & OM2 totally occluded at ostium, SVG to PDA totally occluded within previously placed prox vein graft stent, smal distal AAA, bialt iliac stents with  30% left end-stent restenosis and 50% right end-stent restnosis(Dr. Gerrie Nordmann)  . CARDIAC CATHETERIZATION  06/11/1998   L main with 20% narrowing in distal 1/3; LAD with 1st diagonla having 70% ostial narrowing, 2nd diagonal with 40% narrowing in prox third, LIMA & RIMA widely patent; in-stent restenosis of RCIA with successful PTA and new prox SVTRCA stent for residual disease (Dr. Marella Chimes)  . CARDIAC DEFIBRILLATOR PLACEMENT  06/2005   [employer] Vitality HE - ischemic cardiomyopathy - Dr. Marella Chimes  . CAROTID ENDARTERECTOMY Left 01/04/12  . CORONARY ANGIOPLASTY  01/08/2004   cutting balloon atherectomy & percutaneous intervention of RCIA in-stent restenosis (Dr. Marella Chimes)  . CORONARY ARTERY BYPASS GRAFT  11/04/1985   x2 - PDA and sequential DX-OM (Dr. Redmond Pulling)  . ENDARTERECTOMY  01/04/2012   Procedure: ENDARTERECTOMY CAROTID;left  Surgeon: Hinda Lenis, MD;  Location: [place];  Service: Vascular;  Laterality: Left;  with patch angioplasty  . ICD GENERATOR CHANGE  05/02/2010   [employer]  . ILIAC ARTERY STENT Bilateral 03/1997   and L SFA PTA  . Iron infusion  June 16, 2012  . LEFT HEART CATHETERIZATION WITH CORONARY ANGIOGRAM [name] 07/06/2014   Procedure: LEFT HEART CATHETERIZATION WITH CORONARY ANGIOGRAM;  Surgeon: Peter M Martinique, MD;  Location:  [facility] CATH LAB;  Service: Cardiovascular;  Laterality: [name];  . NM MYOCAR PERF WALL MOTION  09/2012   lexiscan myoview - mod-severe perfusion defect r/t infarct or scar w/mild periinfarct ishcemia in basal inferior, mid inferior, apical inferior, basal inferolateral & mid inferoalteral regions - EF 21% low risk scan  . POLYPECTOMY    . RENAL ARTERY STENT Left 03/24/2004   6x52m [employer] stent (Dr. RMarella Chimes  . TRANSTHORACIC ECHOCARDIOGRAM  12/2012   EF 30-35; LV mod to severely dilated, mod concentric hypertrophy, severe hypokinesis of inferolateral myocardium, moderate hypokineis of anteroseptal region, grade 1 diastolic dysfunction; mod MR; LA mod-severely dialted; RV mod dialted; RA mildly dilated; PA peak pressure 36mg     Family History  Problem Relation Age of Onset  . Colon cancer Sister   . Cancer Sister   . Hypertension Sister   . Hyperlipidemia Sister   . Diabetes Sister   . Heart disease Sister     before age 71. Heart disease Brother   . Lung cancer Mother   . Cancer Mother   . Diabetes Mother   . Hypertension Mother   . Hyperlipidemia Mother   . Heart disease Mother     before age 629. Heart attack Maternal Grandmother   . Colon polyps Sister   . Heart attack Father   . Heart disease Father     before age 71. Hypertension Father   . COPD Sister   . Diabetes Daughter      Social History   Social History  . Marital status: Married    Spouse name: [name]  . Number of children: 2  . Years of education: [name]   Occupational History  . retired poSolicitor   Social History Main Topics  . Smoking status: Former Smoker    Packs/day: 1.50    Years: 40.00    Types: Cigarettes    Quit date: 12/11/2002  . 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 Alcohol use No  . Drug use: No  . Sexual activity: Not Currently   Other Topics Concern  . Not on file   Social History Narrative  . No narrative  on file     BP 100/62 (BP Location: Left Arm, Patient  Position: Sitting, Cuff Size: Normal)   Pulse 62   Ht '5\' 8"'$  (1.727 m)   Wt 228 lb 9.6 oz (103.7 kg)   BMI 34.76 kg/m   Physical Exam:  Well appearing obese man, NAD HEENT: Unremarkable Neck:  7 cm JVD, no thyromegally Back:  No CVA tenderness Lungs:  Clear with no wheezes, rales, or rhonchi. Well healed ICD incision. HEART:  Regular rate rhythm, no murmurs, no rubs, no clicks Abd:  soft, obese, positive bowel sounds, no organomegally, no rebound, no guarding Ext:  2 plus pulses, 1+ peripheral edema, no cyanosis, no clubbing Skin:  No rashes no nodules Neuro:  CN II through XII intact, motor grossly intact  EKG - normal sinus rhythm with BiV pacing  DEVICE  Normal device function.  See PaceArt for details.   Assess/Plan: 1. Chronic systolic heart failure - his symptoms have improved. He will continue his current meds. 2. BiV ICD - his [place] [employer] BiV ICD is working normally. Will recheck in several months. 3. VT - he will continue amiodarone daily.   Mikle Bosworth.D.

## 2016-11-21 NOTE — Patient Instructions (Addendum)
Medication Instructions:  Your physician recommends that you continue on your current medications as directed. Please refer to the Current Medication list given to you today.   Labwork: None Ordered   Testing/Procedures: None Ordered   Follow-Up: Your physician wants you to follow-up in: 1 year with Dr. Lovena Le. You will receive a reminder letter in the mail two months in advance. If you don't receive a letter, please call our office to schedule the follow-up appointment.  Remote monitoring is used to monitor your ICD from home. This monitoring reduces the number of office visits required to check your device to one time per year. It allows [place] to keep an eye on the functioning of your device to ensure it is working properly. You are scheduled for a device check from home on 02/20/17. You may send your transmission at any time that day. If you have a wireless device, the transmission will be sent automatically. After your physician reviews your transmission, you will receive a postcard with your next transmission date.     Any Other Special Instructions Will Be Listed Below (If Applicable).     If you need a refill on your cardiac medications before your next appointment, please call your pharmacy.

## 2016-11-22 ENCOUNTER — Telehealth (HOSPITAL_COMMUNITY): Payer: Self-pay | Admitting: Vascular Surgery

## 2016-11-22 NOTE — Telephone Encounter (Signed)
LEFT PT MESSAGE TO MAKE APPT

## 2016-12-11 DIAGNOSIS — C801 Malignant (primary) neoplasm, unspecified: Secondary | ICD-10-CM

## 2016-12-11 HISTORY — DX: Malignant (primary) neoplasm, unspecified: C80.1

## 2016-12-14 ENCOUNTER — Other Ambulatory Visit: Payer: Self-pay

## 2016-12-14 MED ORDER — ALBUTEROL SULFATE (2.5 MG/3ML) 0.083% IN NEBU: 2.5000 mg | INHALATION_SOLUTION | Freq: Four times a day (QID) | RESPIRATORY_TRACT | 3 refills | Status: DC

## 2016-12-14 NOTE — Telephone Encounter (Signed)
Received refill request for duoneb from [employer].  Rx sent to preferred pharmacy. Nothing further needed.

## 2016-12-15 ENCOUNTER — Other Ambulatory Visit: Payer: Self-pay | Admitting: Cardiology

## 2016-12-15 ENCOUNTER — Other Ambulatory Visit: Payer: Self-pay | Admitting: Internal Medicine

## 2016-12-18 ENCOUNTER — Telehealth: Payer: Self-pay | Admitting: Oncology

## 2016-12-18 NOTE — Telephone Encounter (Signed)
left voicemail message advising patient that appointment has been changed from 12/20/16 to 01/17/17 @ 11:30 AM

## 2016-12-18 NOTE — Telephone Encounter (Signed)
Previous refills have been authorized under Dr Debara Pickett however his office has routed request to our office indicating that this is Dr Forde Dandy patient. Does not appear that the patient has been seeing Dr Debara Pickett. Okay to refill under Dr Lovena Le? Please advise. Thanks, MI

## 2016-12-20 ENCOUNTER — Other Ambulatory Visit: Payer: 59

## 2016-12-20 ENCOUNTER — Ambulatory Visit: Payer: 59 | Admitting: Oncology

## 2016-12-21 ENCOUNTER — Ambulatory Visit (HOSPITAL_COMMUNITY)
Admission: RE | Admit: 2016-12-21 | Discharge: 2016-12-21 | Disposition: A | Discharge status: Home / Self Care | Payer: Medicare Other | Source: Ambulatory Visit | Attending: Cardiology | Admitting: Cardiology

## 2016-12-21 ENCOUNTER — Encounter (HOSPITAL_COMMUNITY): Payer: Self-pay

## 2016-12-21 VITALS — BP 108/60 | HR 63 | Wt 239.5 lb

## 2016-12-21 DIAGNOSIS — Z5189 Encounter for other specified aftercare: Secondary | ICD-10-CM | POA: Insufficient documentation

## 2016-12-21 DIAGNOSIS — I739 Peripheral vascular disease, unspecified: Secondary | ICD-10-CM | POA: Insufficient documentation

## 2016-12-21 DIAGNOSIS — E785 Hyperlipidemia, unspecified: Secondary | ICD-10-CM

## 2016-12-21 DIAGNOSIS — Z79899 Other long term (current) drug therapy: Secondary | ICD-10-CM | POA: Insufficient documentation

## 2016-12-21 DIAGNOSIS — Z9889 Other specified postprocedural states: Secondary | ICD-10-CM | POA: Insufficient documentation

## 2016-12-21 DIAGNOSIS — I110 Hypertensive heart disease with heart failure: Secondary | ICD-10-CM | POA: Insufficient documentation

## 2016-12-21 DIAGNOSIS — I5022 Chronic systolic (congestive) heart failure: Secondary | ICD-10-CM | POA: Insufficient documentation

## 2016-12-21 DIAGNOSIS — R5383 Other fatigue: Secondary | ICD-10-CM

## 2016-12-21 DIAGNOSIS — I255 Ischemic cardiomyopathy: Secondary | ICD-10-CM | POA: Insufficient documentation

## 2016-12-21 DIAGNOSIS — I2510 Atherosclerotic heart disease of native coronary artery without angina pectoris: Secondary | ICD-10-CM | POA: Insufficient documentation

## 2016-12-21 DIAGNOSIS — J449 Chronic obstructive pulmonary disease, unspecified: Secondary | ICD-10-CM | POA: Insufficient documentation

## 2016-12-21 DIAGNOSIS — Z87891 Personal history of nicotine dependence: Secondary | ICD-10-CM | POA: Insufficient documentation

## 2016-12-21 DIAGNOSIS — I5042 Chronic combined systolic (congestive) and diastolic (congestive) heart failure: Secondary | ICD-10-CM | POA: Diagnosis not present

## 2016-12-21 DIAGNOSIS — Z951 Presence of aortocoronary bypass graft: Secondary | ICD-10-CM | POA: Insufficient documentation

## 2016-12-21 LAB — COMPREHENSIVE METABOLIC PANEL
ALT: 14 U/L — ABNORMAL LOW (ref 17–63)
AST: 17 U/L (ref 15–41)
Albumin: 3.7 g/dL (ref 3.5–5.0)
Alkaline Phosphatase: 62 U/L (ref 38–126)
Anion gap: 10 (ref 5–15)
BUN: 25 mg/dL — ABNORMAL HIGH (ref 6–20)
CO2: 23 mmol/L (ref 22–32)
Calcium: 8.9 mg/dL (ref 8.9–10.3)
Chloride: 106 mmol/L (ref 101–111)
Creatinine, Ser: 0.98 mg/dL (ref 0.61–1.24)
GFR calc Af Amer: >60 mL/min (ref >60)
GFR calc non Af Amer: >60 mL/min (ref >60)
Glucose, Bld: 145 mg/dL — ABNORMAL HIGH (ref 65–99)
Potassium: 4.3 mmol/L (ref 3.5–5.1)
Sodium: 139 mmol/L (ref 135–145)
Total Bilirubin: 0.8 mg/dL (ref 0.3–1.2)
Total Protein: 6.2 g/dL — ABNORMAL LOW (ref 6.5–8.1)

## 2016-12-21 LAB — TSH: TSH: 3.47 u[IU]/mL (ref 0.350–4.500)

## 2016-12-21 MED ORDER — DOXYCYCLINE HYCLATE 100 MG PO CAPS: 100.0000 mg | ORAL_CAPSULE | Freq: Two times a day (BID) | ORAL | 0 refills | Status: DC

## 2016-12-21 NOTE — Patient Instructions (Signed)
Labs today (will call for abnormal results, otherwise no news is good news)  START taking doxycycline for the next 7 days.  Take 100 mg (1 Tab) Two times Daily for 7 days to complete the course.   Follow up in 3 months

## 2016-12-22 ENCOUNTER — Telehealth (HOSPITAL_COMMUNITY): Payer: Self-pay | Admitting: Pharmacist

## 2016-12-22 ENCOUNTER — Other Ambulatory Visit: Payer: Self-pay | Admitting: *Deleted

## 2016-12-22 DIAGNOSIS — R911 Solitary pulmonary nodule: Secondary | ICD-10-CM

## 2016-12-22 NOTE — Telephone Encounter (Signed)
PAN foundation grant renewed from 11/29/16 through 12/21/17. [name] will have $800 to use toward his [name] copays during this time. Card information is the same.   Billing ID: [id number] Person Code: [id number] RX Group: [id number] RX BIN: [id number] PCN for Part D: [id number]  [name] K. Velva Harman, PharmD, BCPS, CPP Clinical Pharmacist Pager: [phone] Phone: [phone] 12/22/2016 3:18 PM

## 2016-12-23 NOTE — Progress Notes (Signed)
Patient ID: [name], male   DOB: Sep 28, 1946, 71 y.o.   MRN: [id number] [facility]  12/23/2016 [name]   October 21, 1946  [id number]  Primary Physicia Tyrone Calico, MD Hematologist: [name]  Pulmonary: [name] Cardiology: [name]  HPI:  [name] is a pleasant 71 yo with a past medical history significant for morbid obesity, systolic heart failure with an EF of 30-35% (January 2014) -> 20-25% (March 2015 - RV normal), VT on amio, CAD s/p CABG 1996, LBBB, COPD, obstructive sleep apnea on CPAP, AAA repair (January 2015) who was admitted to [facility] on 02/24/14 for acute on chronic HF and acute COPD exacerbation. He also had iron deficiency anemia.   Admitted to [facility] July 24 through July 06 2014 with chest pain.  CEs negative. Had a LHC with no change from previous LHC with recommendations to continue medical management. Discharge weight was 255 pounds.   LHC 07/06/14 --No significant change from previous studies.  Left anterior descending (LAD): The LAD is a large vessel. There is a 40% stenosis immediately after the takeoff of a large septal perforator. There are 2 large diagonal branches without significant disease. Left circumflex (LCx): The LCx is occluded proximally. There are left to left collaterals.  Right coronary artery (RCA): The RCA is occluded proximally immediately following the conus branch. There are right to right and left to right collaterals. SVGs from CABG known to be totally occluded.   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   CTA chest/abd/pelvis (3/16) with moderate emphysema, 1.3 cm nodule RUL, left SFA totally occluded, right SFA with significant stenosis, s/p endovascular AAA repair (stable).   Echo (5/17) with EF 20-25%, severe LV dilation, mild MR, normal RV size with mildly decreased systolic function.   He remains on goal doses of cardiac meds.  Doing well overall. Weight up a bit due to Christmas. No exertional dyspnea.  Using CPAP.   No chest pain, orthopnea, PND.  Has URI/bronchitis with productive cough and congestion, has been around a while now.  No fever.   RUL nodule has enlarged, getting a repeat CT in 3 mos per [name].   Labs 3/15 Cr 1.5 K 5.6 Labs 07/06/14 K 3.9 Creatinine  0.98 Labs 9/15 K 5.1, creatinine 0.96 Labs 11/15 K 3.8, creatinine 0.83, LDL 61, LFTs normal, TSH normal Labs 11/26/14 K 5.0 Creatinine 0.73  Labs 12/08/14 K 3.4 Creatinine 0.83  Labs 3/16 K 4.4, creatinine 1.03, LFTs normal, TSH normal, HCT 37.8, BNP 65 Labs 6/16 K 4.1, creatinine 0.93, TSH normal, LFTs normal Labs 11/16 K 5.1, creatinine 1.13, LFTs normal, TSH normal, LDL 83, HDL 33, TGs 162 Labs 1/17 K 4.4, creatinine 0.86, pro-BNP 154 Labs 2/17 BNP 113, K 3.8, creatinine 0.79, TSH normal, LFTs normal Labs 4/17 K 3.8, creatinine 0.91, HCT 48.1 Labs 6/17 K 3.5, creatinine 0.98 Labs 9/17 K 3.9, creatinine 0.79, LDL 69, HDL 29, TSH normal, LFTs normal  ECG (12/17): BiV paced, atrial rhythm hard to determine  ROS: All systems reviewed and negative except as per HPI.   Past Medical History:  Diagnosis Date  . AAA (abdominal aortic aneurysm) ([place])    followed by [name]  . Adenomatous colon polyp 01/2004  . Anemia    hx  . Automatic implantable cardioverter-defibrillator in situ   . AVM (arteriovenous malformation)   . BPH (benign prostatic hypertrophy)   . CAD (coronary artery disease)    s/p CABGx2 in 1996  . Carotid artery  stenosis    LCEA - [name] in 2013  . CHF (congestive heart failure) ([place])   . Complication of anesthesia    claustrophobic, unabe to lie on back more than 4 hours at time due to back  . COPD (chronic obstructive pulmonary disease) ([place])   . Diabetes mellitus    DIET CONTROLLED- pt states that this was a misdiagnosis, he was treated while in the hospital  but returned home, loss a massive amount of weight and he has not had a problem with his blood sugar since. States everything has been normal for  about 3 years.  . Diverticulosis   . Dysrhythmia    ICD-defibrillator  . Fatigue   . GERD (gastroesophageal reflux disease)   . H/O hiatal hernia   . HLD (hyperlipidemia)   . Hypertension   . Hypothyroidism   . Ischemic cardiomyopathy   . Myocardial infarction   . OSA on CPAP    AHI durign total sleep 14.69/hr, during REM 50.91/hr  . Peripheral vascular disease (HCC)    LCEA, L renal artery stent, bilat iliac stents, R SFA stenosis  . Tremor   . Ventricular tachycardia ([place]) 09/09/2014   Amiodarone was started after appropriate defibrillator shocks for ventricular tachycardia in October 2008    Current Outpatient Prescriptions  Medication Sig Dispense Refill  . albuterol (PROVENTIL HFA;VENTOLIN HFA) 108 (90 BASE) MCG/ACT inhaler Inhale 2 puffs into the lungs every 6 (six) hours as needed for wheezing or shortness of breath. 3 Inhaler 4  . albuterol (PROVENTIL) (2.5 MG/3ML) 0.083% nebulizer solution Take 3 mLs (2.5 mg total) by nebulization every 6 (six) hours. 120 mL 3  . amiodarone (PACERONE) 200 MG tablet TAKE 1 TABLET BY MOUTH  DAILY 90 tablet 3  . aspirin 81 MG tablet Take 81 mg by mouth daily.    Marland Kitchen atorvastatin (LIPITOR) 40 MG tablet TAKE 1 TABLET BY MOUTH  DAILY 90 tablet 3  . benzonatate (TESSALON) 100 MG capsule Take 1 capsule (100 mg total) by mouth 3 (three) times daily as needed for cough. 20 capsule 0  . carvedilol (COREG) 25 MG tablet TAKE 1 TABLET BY MOUTH  TWICE A DAY WITH MEALS 180 tablet 3  . clopidogrel (PLAVIX) 75 MG tablet TAKE 1 TABLET BY MOUTH  DAILY 90 tablet 3  . finasteride (PROSCAR) 5 MG tablet Take 5 mg by mouth daily.    . fluticasone (FLONASE) 50 MCG/ACT nasal spray Instill 1 spray in each  nostril daily 32 g 3  . furosemide (LASIX) 80 MG tablet TAKE 1 TABLET BY MOUTH TWO  TIMES DAILY 180 tablet 3  . Glycopyrrolate-Formoterol (BEVESPI AEROSPHERE) 9-4.8 MCG/ACT AERO Inhale 2 puffs into the lungs 2 (two) times daily. 2 Inhaler 0  .  guaiFENesin-dextromethorphan (ROBITUSSIN DM) 100-10 MG/5ML syrup Take 5 mLs by mouth every 4 (four) hours as needed for cough. 118 mL 0  . ipratropium (ATROVENT HFA) 17 MCG/ACT inhaler Inhale 2 puffs into the lungs every 4 (four) hours as needed for wheezing. 1 Inhaler 12  . ipratropium (ATROVENT) 0.03 % nasal spray Place 2 sprays into both nostrils 2 (two) times daily. Do not use for more than 5days. 30 mL 0  . isosorbide mononitrate (IMDUR) 60 MG 24 hr tablet Take 1 tablet by mouth  daily 90 tablet 3  . levothyroxine (SYNTHROID, LEVOTHROID) 50 MCG tablet TAKE 1 TABLET BY MOUTH  DAILY BEFORE BREAKFAST 90 tablet 1  . Multiple Vitamins-Minerals (MENS 50+ MULTI VITAMIN/MIN) TABS Take 1 tablet  by mouth daily.     . multivitamin-lutein (OCUVITE-LUTEIN) CAPS Take 1 capsule by mouth daily.      . NON FORMULARY at bedtime. CPAP And during the day as needed    . sacubitril-valsartan (ENTRESTO) 97-103 MG Take 1 tablet by mouth 2 (two) times daily. 60 tablet 6  . spironolactone (ALDACTONE) 25 MG tablet Take 1 tablet by mouth  daily 90 tablet 3  . doxycycline (VIBRAMYCIN) 100 MG capsule Take 1 capsule (100 mg total) by mouth 2 (two) times daily. 14 capsule 0  . nitroGLYCERIN (NITROSTAT) 0.4 MG SL tablet DISSOLVE ONE TABLET UNDER  THE TONGUE EVERY 5 MINUTES  AS NEEDED FOR CHEST PAIN  (UP TO 3 TABS IN 15 MINUTES THEN CALL 911) (Patient not taking: Reported on 12/21/2016) 75 tablet 0   Current Facility-Administered Medications  Medication Dose Route Frequency Provider Last Rate Last Dose  . ipratropium-albuterol (DUONEB) 0.5-2.5 (3) MG/3ML nebulizer solution 3 mL  3 mL Nebulization Once [name], MD        Allergies  Allergen Reactions  . Tussionex Pennkinetic Er [Hydrocod Polst-Cpm Polst Er] Other (See Comments)    "caused prostate problems"  . Ace Inhibitors Other (See Comments)    REACTION: cough    Social History   Social History  . Marital status: Married    Spouse name: [name]  . Number of  children: 2  . Years of education: [name]   Occupational History  . retired Solicitor     Social History Main Topics  . Smoking status: Former Smoker    Packs/day: 1.50    Years: 40.00    Types: Cigarettes    Quit date: 12/11/2002  . Smokeless tobacco: Former Systems developer  . Alcohol use No  . Drug use: No  . Sexual activity: Not Currently   Other Topics Concern  . Not on file   Social History Narrative  . No narrative on file    Family History  Problem Relation Age of Onset  . Colon cancer Sister   . Cancer Sister   . Hypertension Sister   . Hyperlipidemia Sister   . Diabetes Sister   . Heart disease Sister     before age 31  . Heart disease Brother   . Lung cancer Mother   . Cancer Mother   . Diabetes Mother   . Hypertension Mother   . Hyperlipidemia Mother   . Heart disease Mother     before age 68  . Heart attack Maternal Grandmother   . Colon polyps Sister   . Heart attack Father   . Heart disease Father     before age 57  . Hypertension Father   . COPD Sister   . Diabetes Daughter    Blood pressure 108/60, pulse 63, weight 239 lb 8 oz (108.6 kg), SpO2 95 %.   Wt Readings from Last 3 Encounters:  12/21/16 239 lb 8 oz (108.6 kg)  11/21/16 228 lb 9.6 oz (103.7 kg)  11/07/16 226 lb (102.5 kg)    General appearance: alert, cooperative and no distress.  Neck: Thick.  No carotid bruit or JVP appreciated. L CEA scar Lungs: Distant breath sounds bilaterally.     Heart: regular rate and rhythm, S1, S2 normal, 1/6 SEM at RUSB.  No S3/S4. Unable to palpate pedal pulses.  Extremities:  No clubbing/cyanosis. Trace ankle edema.  Skin: warm and dry Neurologic: Grossly normal  ASSESSMENT/PLAN:  1. Chronic systolic CHF: Ischemic cardiomyopathy. ECHO 5/17  EF 20-25% with severe LV dilation.  [employer] CRT-D. NYHA class II symptoms.  Volume status looks stable.  - Continue lasix 80 mg BID. CMET today.  - Continue carvedilol 25 mg twice a day  - Continue  Entresto 97/103 bid.     - Continue spironolactone 25 mg daily.  2. CAD s/p CABG: No chest pain. Continue 81 mg aspirin daily and atorvastatin daily.   - Good lipids 9/17.   3. VT s/p ICD: On amiodarone for history of ICD discharges due to VT.   - CMET and TSH today. Knows he needs yearly eye exams.   4. Morbid obesity: Trending down though up over Christmas. 5. COPD: Moderate to severe. Has RUL nodule that is growing and concerning for malignancy, difficult site for biopsy.   - Tyrone Roxan Small following nodule with serial CTs, plan next for 3 months.  6. PAD: Occluded left SFA and significant right SFA stenosis on last CTA.  He does not report claudication and does not have pedal ulcers.  Followed by [facility]. He is on statin.   [name] 12/23/2016

## 2016-12-27 ENCOUNTER — Other Ambulatory Visit (HOSPITAL_COMMUNITY): Payer: Self-pay | Admitting: Cardiology

## 2017-01-04 NOTE — Telephone Encounter (Signed)
I have made several attempts to reach the patient today with no success. I have left a vm for patient to call the office.

## 2017-01-04 NOTE — Telephone Encounter (Signed)
Dr. Lovena Le stated "Ok to refill. No Sildenafil or Cialis" while taking Isosorbide. Will route back to Mindy in refills.

## 2017-01-09 NOTE — Telephone Encounter (Signed)
I have made multiple attempts to reach the patient with no success. I left a vm requesting that he call the office but he has not done so. Please advise. Thanks, MI

## 2017-01-15 ENCOUNTER — Ambulatory Visit (HOSPITAL_COMMUNITY): Payer: Medicare Other

## 2017-01-17 ENCOUNTER — Other Ambulatory Visit (HOSPITAL_BASED_OUTPATIENT_CLINIC_OR_DEPARTMENT_OTHER): Payer: Medicare Other

## 2017-01-17 ENCOUNTER — Ambulatory Visit (HOSPITAL_BASED_OUTPATIENT_CLINIC_OR_DEPARTMENT_OTHER): Payer: Medicare Other | Admitting: Oncology

## 2017-01-17 ENCOUNTER — Ambulatory Visit (HOSPITAL_COMMUNITY)
Admission: RE | Admit: 2017-01-17 | Discharge: 2017-01-17 | Disposition: A | Discharge status: Home / Self Care | Payer: Medicare Other | Source: Ambulatory Visit | Attending: Thoracic Surgery (Cardiothoracic Vascular Surgery) | Admitting: Thoracic Surgery (Cardiothoracic Vascular Surgery)

## 2017-01-17 VITALS — BP 119/48 | HR 65 | Temp 97.5°F | Resp 16 | Wt 242.5 lb

## 2017-01-17 DIAGNOSIS — R911 Solitary pulmonary nodule: Secondary | ICD-10-CM | POA: Insufficient documentation

## 2017-01-17 DIAGNOSIS — D508 Other iron deficiency anemias: Secondary | ICD-10-CM

## 2017-01-17 DIAGNOSIS — I509 Heart failure, unspecified: Secondary | ICD-10-CM

## 2017-01-17 DIAGNOSIS — J439 Emphysema, unspecified: Secondary | ICD-10-CM | POA: Diagnosis not present

## 2017-01-17 DIAGNOSIS — D509 Iron deficiency anemia, unspecified: Secondary | ICD-10-CM

## 2017-01-17 LAB — CBC WITH DIFFERENTIAL/PLATELET
BASO%: 0.8 % (ref 0.0–2.0)
Basophils Absolute: 0 10*3/uL (ref 0.0–0.1)
EOS%: 5.7 % (ref 0.0–7.0)
Eosinophils Absolute: 0.3 10*3/uL (ref 0.0–0.5)
HCT: 45.6 % (ref 38.4–49.9)
HGB: 15.1 g/dL (ref 13.0–17.1)
LYMPH%: 22.1 % (ref 14.0–49.0)
MCH: 31.1 pg (ref 27.2–33.4)
MCHC: 33.1 g/dL (ref 32.0–36.0)
MCV: 93.9 fL (ref 79.3–98.0)
MONO#: 0.5 10*3/uL (ref 0.1–0.9)
MONO%: 8 % (ref 0.0–14.0)
NEUT#: 3.6 10*3/uL (ref 1.5–6.5)
NEUT%: 63.4 % (ref 39.0–75.0)
Platelets: 139 10*3/uL — ABNORMAL LOW (ref 140–400)
RBC: 4.85 10*6/uL (ref 4.20–5.82)
RDW: 14.3 % (ref 11.0–14.6)
WBC: 5.7 10*3/uL (ref 4.0–10.3)
lymph#: 1.3 10*3/uL (ref 0.9–3.3)

## 2017-01-17 LAB — GLUCOSE, CAPILLARY: Glucose-Capillary: 100 mg/dL — ABNORMAL HIGH (ref 65–99)

## 2017-01-17 LAB — FERRITIN: Ferritin: 67 ng/ml (ref 22–316)

## 2017-01-17 LAB — IRON AND TIBC
%SAT: 44 % (ref 20–55)
Iron: 123 ug/dL (ref 42–163)
TIBC: 276 ug/dL (ref 202–409)
UIBC: 153 ug/dL (ref 117–376)

## 2017-01-17 MED ORDER — FLUDEOXYGLUCOSE F - 18 (FDG) INJECTION: 12.5000 | Freq: Once | INTRAVENOUS | Status: DC | PRN

## 2017-01-17 NOTE — Progress Notes (Signed)
Hematology and Oncology Follow Up Visit  [name] [id number] [birth year] 71 y.o. 01/17/2017 12:30 PM   Principle Diagnosis: 71 year old with iron deficiency anemia diagnosed in 06/2012. This is likely due to GI  loss from AVMs. He now has a right upper lobe lung nodule detected incidentally on imaging studies in 2017.   Prior Therapy: IV iron infusion intermittently. S/P Feraheme in 06/2012 and most recent of which in March 2016.  Interim History:  [name] presents today for a follow up visit with his wife. Since his last visit, he reports no complaints. He just underwent a PET CT scan to evaluate a 9.5 mm right upper lobe nodule that was detected and a CT scan in May 2017. He has been seen by thoracic surgery but he is not a candidate for any primary surgical therapy.   He denied any recent hospitalizations or illnesses. He denied any hematochezia or melena. Did not report any excessive fatigue or tiredness.  He does not report any headaches or blurry does not report any seizure or syncope. Does not report any fevers or chills or sweats. Does not report any chest pain or palpitation. Does not report any cough or wheezing. Does not report any nausea, vomiting or abdominal pain. He does not report any lymphadenopathy or petechiae. Does not report any genitourinary complaints. His review of systems unremarkable.    Medications: I have reviewed the patient's current medications.  Current Outpatient Prescriptions  Medication Sig Dispense Refill  . albuterol (PROVENTIL HFA;VENTOLIN HFA) 108 (90 BASE) MCG/ACT inhaler Inhale 2 puffs into the lungs every 6 (six) hours as needed for wheezing or shortness of breath. 3 Inhaler 4  . albuterol (PROVENTIL) (2.5 MG/3ML) 0.083% nebulizer solution Take 3 mLs (2.5 mg total) by nebulization every 6 (six) hours. 120 mL 3  . amiodarone (PACERONE) 200 MG tablet TAKE 1 TABLET BY MOUTH  DAILY 90 tablet 3  . aspirin 81 MG tablet Take 81 mg by mouth daily.    Marland Kitchen  atorvastatin (LIPITOR) 40 MG tablet TAKE 1 TABLET BY MOUTH  DAILY 90 tablet 3  . benzonatate (TESSALON) 100 MG capsule Take 1 capsule (100 mg total) by mouth 3 (three) times daily as needed for cough. 20 capsule 0  . carvedilol (COREG) 25 MG tablet TAKE 1 TABLET BY MOUTH  TWICE A DAY WITH MEALS 180 tablet 3  . clopidogrel (PLAVIX) 75 MG tablet TAKE 1 TABLET BY MOUTH  DAILY 90 tablet 3  . doxycycline (VIBRAMYCIN) 100 MG capsule Take 1 capsule (100 mg total) by mouth 2 (two) times daily. 14 capsule 0  . ENTRESTO 97-103 MG TAKE 1 TABLET BY MOUTH TWICE A DAY 60 tablet 6  . finasteride (PROSCAR) 5 MG tablet Take 5 mg by mouth daily.    . fluticasone (FLONASE) 50 MCG/ACT nasal spray Instill 1 spray in each  nostril daily 32 g 3  . furosemide (LASIX) 80 MG tablet TAKE 1 TABLET BY MOUTH TWO  TIMES DAILY 180 tablet 3  . Glycopyrrolate-Formoterol (BEVESPI AEROSPHERE) 9-4.8 MCG/ACT AERO Inhale 2 puffs into the lungs 2 (two) times daily. 2 Inhaler 0  . guaiFENesin-dextromethorphan (ROBITUSSIN DM) 100-10 MG/5ML syrup Take 5 mLs by mouth every 4 (four) hours as needed for cough. 118 mL 0  . ipratropium (ATROVENT HFA) 17 MCG/ACT inhaler Inhale 2 puffs into the lungs every 4 (four) hours as needed for wheezing. 1 Inhaler 12  . ipratropium (ATROVENT) 0.03 % nasal spray Place 2 sprays into both nostrils 2 (  two) times daily. Do not use for more than 5days. 30 mL 0  . isosorbide mononitrate (IMDUR) 60 MG 24 hr tablet TAKE 1 TABLET BY MOUTH  DAILY 90 tablet 3  . levothyroxine (SYNTHROID, LEVOTHROID) 50 MCG tablet TAKE 1 TABLET BY MOUTH  DAILY BEFORE BREAKFAST 90 tablet 1  . Multiple Vitamins-Minerals (MENS 50+ MULTI VITAMIN/MIN) TABS Take 1 tablet by mouth daily.     . multivitamin-lutein (OCUVITE-LUTEIN) CAPS Take 1 capsule by mouth daily.      . nitroGLYCERIN (NITROSTAT) 0.4 MG SL tablet DISSOLVE ONE TABLET UNDER  THE TONGUE EVERY 5 MINUTES  AS NEEDED FOR CHEST PAIN  (UP TO 3 TABS IN 15 MINUTES THEN CALL 911) (Patient  not taking: Reported on 12/21/2016) 75 tablet 0  . NON FORMULARY at bedtime. CPAP And during the day as needed    . spironolactone (ALDACTONE) 25 MG tablet Take 1 tablet by mouth  daily 90 tablet 3   Current Facility-Administered Medications  Medication Dose Route Frequency Provider Last Rate Last Dose  . ipratropium-albuterol (DUONEB) 0.5-2.5 (3) MG/3ML nebulizer solution 3 mL  3 mL Nebulization Once [name], MD       Facility-Administered Medications Ordered in Other Visits  Medication Dose Route Frequency Provider Last Rate Last Dose  . fludeoxyglucose F - 18 (FDG) injection 16.1 millicurie  09.6 millicurie Intravenous Once PRN Gaspar Cola, MD         Allergies:  Allergies  Allergen Reactions  . Tussionex Pennkinetic Er [Hydrocod Polst-Cpm Polst Er] Other (See Comments)    "caused prostate problems"  . Ace Inhibitors Other (See Comments)    REACTION: cough    Past Medical History, Surgical history, Social history, and Family History were reviewed and updated.     Physical Exam: Blood pressure (!) 119/48, pulse 65, temperature 97.5 F (36.4 C), temperature source Oral, resp. rate 16, weight 242 lb 8 oz (110 kg), SpO2 98 %. ECOG: 1 General appearance: Alert, awake pleasant gentleman without distress. Head: Normocephalic, without obvious abnormality no oral thrush noted. Neck: no JVD. No lymphadenopathy noted. Lymph nodes: Cervical, supraclavicular, and axillary nodes normal. Heart:regular rate and rhythm, S1, S2 normal, no murmur, click, rub or gallop Lung:chest clear, no wheezing, rales, normal symmetric air entry Abdomen: soft, non-tender, without masses or organomegaly no shifting dullness or ascites. EXT:no erythema, induration, or nodules   Lab Results: Lab Results  Component Value Date   WBC 5.7 01/17/2017   HGB 15.1 01/17/2017   HCT 45.6 01/17/2017   MCV 93.9 01/17/2017   PLT 139 (L) 01/17/2017     Chemistry      Component Value Date/Time   NA 139  12/21/2016 1548   NA 145 02/16/2015 1427   K 4.3 12/21/2016 1548   K 4.2 02/16/2015 1427   CL 106 12/21/2016 1548   CL 94 (L) 10/02/2012 0937   CO2 23 12/21/2016 1548   CO2 27 02/16/2015 1427   BUN 25 (H) 12/21/2016 1548   BUN 18.5 02/16/2015 1427   CREATININE 0.98 12/21/2016 1548   CREATININE 1.0 02/16/2015 1427      Component Value Date/Time   CALCIUM 8.9 12/21/2016 1548   CALCIUM 9.5 02/16/2015 1427   ALKPHOS 62 12/21/2016 1548   ALKPHOS 108 02/16/2015 1427   AST 17 12/21/2016 1548   AST 17 02/16/2015 1427   ALT 14 (L) 12/21/2016 1548   ALT 17 02/16/2015 1427   BILITOT 0.8 12/21/2016 1548   BILITOT 0.42 02/16/2015 1427  Impression and Plan:  71 year old gentleman with the following issues:   1. Microcytic hypochromic anemia with a documented iron deficiency anemia  in May of 2013. He is status post IV iron on several occasions the last of which was in March 2016.   His hemoglobin is within normal range and iron studies in July 2017 were all within normal range. I recommended continued observation and surveillance and repeat iron studies in 6 months.  2. Right upper lobe nodule measuring 11 x 7.7 mm with a mean diameter 9.5. On previous examination in May 2017 the mean diameter was 5 mm. He did have a PET scan today although the results are not available at this time.  The appearance does suggest the potential of primary lung neoplasm although he is not a candidate for aggressive primary surgical therapy. If his PET scan does show activity to suggest primary lung malignancy, he will likely require radiation therapy as the modality of choice. Do not see any rales for medical oncology involvement unless he has metastatic disease noted.  3. Congestive heart failure: He appears to be well compensated at this time. He does not report any respiratory complaints.  4. Follow up: in 6 months.    [facility] 2/7/201812:30 PM

## 2017-02-06 ENCOUNTER — Other Ambulatory Visit: Payer: Self-pay | Admitting: *Deleted

## 2017-02-06 ENCOUNTER — Ambulatory Visit (INDEPENDENT_AMBULATORY_CARE_PROVIDER_SITE_OTHER): Payer: Medicare Other | Admitting: Thoracic Surgery (Cardiothoracic Vascular Surgery)

## 2017-02-06 VITALS — BP 108/57 | HR 62 | Resp 20 | Ht 68.0 in | Wt 244.0 lb

## 2017-02-06 DIAGNOSIS — R911 Solitary pulmonary nodule: Secondary | ICD-10-CM

## 2017-02-06 DIAGNOSIS — I2510 Atherosclerotic heart disease of native coronary artery without angina pectoris: Secondary | ICD-10-CM

## 2017-02-06 DIAGNOSIS — Z01818 Encounter for other preprocedural examination: Secondary | ICD-10-CM

## 2017-02-06 NOTE — Progress Notes (Signed)
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 He quit smoking about 15 years ago. His PMH is complicated and includes CHF, CAD, s/ CABG, s/p multiple stents, VT, ICD, PVD, hypertension, hyperlipidemia, severe COPD, morbid obesity, sleep apnea, and an AAA.   I first saw him in August 2016 regarding a groundglass opacity in the right upper lobe. He had had a CT done for follow-up of an aortic endograft. There was an incidentally noted a groundglass nodule centrally in the right upper lobe. There was no hilar or mediastinal adenopathy. We have been following him with CT scans since that time. I last saw him in November 2017. The nodule had gradually increased in size. It was stable in November. I have been trying to talk him into having a biopsy, but he was reluctant to do so due to his other medical problems. Therefore, we have continued to follow him radiographically.  In the interim since his last visit he has not had any significant new medical issues. He had been losing weight through dieting, but that has leveled off. His exercise tolerance is stable although he still has significant dyspnea. At times he is even dyspneic at rest. He has COPD and CHF. His CHF seems to be the most limiting factor. He gets SOB with walking <100 feet. He denies cough, hemoptysis, wheezing, orthopnea and PND.   Past Medical History:  Diagnosis Date  . AAA (abdominal aortic aneurysm) ([facility])    followed by Dr. Bridgett Larsson  . Adenomatous colon polyp 01/2004  . Anemia    hx  . Automatic implantable cardioverter-defibrillator in situ   . AVM (arteriovenous malformation)   . BPH (benign prostatic hypertrophy)   . CAD (coronary artery disease)    s/p CABGx2 in 1996  . Carotid artery stenosis    LCEA - Dr. Bridgett Larsson in 2013  . CHF (congestive heart failure) ([facility])   . Complication of  anesthesia    claustrophobic, unabe to lie on back more than 4 hours at time due to back  . COPD (chronic obstructive pulmonary disease) ([facility])   . Diabetes mellitus    DIET CONTROLLED- pt states that this was a misdiagnosis, he was treated while in the hospital  but returned home, loss a massive amount of weight and he has not had a problem with his blood sugar since. States everything has been normal for about 3 years.  . Diverticulosis   . Dysrhythmia    ICD-defibrillator  . Fatigue   . GERD (gastroesophageal reflux disease)   . H/O hiatal hernia   . HLD (hyperlipidemia)   . Hypertension   . Hypothyroidism   . Ischemic cardiomyopathy   . Myocardial infarction   . OSA on CPAP    AHI durign total sleep 14.69/hr, during REM 50.91/hr  . Peripheral vascular disease ([facility])    LCEA, L renal artery stent, bilat iliac stents, R SFA stenosis  . Tremor   . Ventricular tachycardia ([facility]) 09/09/2014   Amiodarone was started after appropriate defibrillator shocks for ventricular tachycardia in October 2008   Past Surgical History:  Procedure Laterality Date  . ABDOMINAL AORTIC ENDOVASCULAR STENT GRAFT [name] 01/06/2014   Procedure: ABDOMINAL AORTIC ENDOVASCULAR STENT GRAFT- GORE; ULTRASOUND GUIDED;  Surgeon:  Conrad Kenosha, MD;  Location: [facility];  Service: Vascular;  Laterality: [name];  . ANGIOPLASTY     BILATERAL  LE  W/STENTS  . BI-VENTRICULAR IMPLANTABLE CARDIOVERTER DEFIBRILLATOR UPGRADE [name] 10/09/2014   Procedure: BI-VENTRICULAR IMPLANTABLE CARDIOVERTER DEFIBRILLATOR UPGRADE;  Surgeon: Evans Lance, MD;  Location: [facility] CATH LAB;  Service: Cardiovascular;  Laterality: [name];  . BIV ICD GENERTAOR CHANGE OUT  10/09/2014   UPGRADE TO BIV        BY DR Lovena Le  . 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 Adora Fridge)  .  CARDIAC CATHETERIZATION  10/03/2002   SVG sequentially to OM1 & OM2 totally occluded at ostium, SVG to PDA totally occluded within previously placed prox vein graft stent, smal distal AAA, bialt iliac stents with 30% left end-stent restenosis and 50% right end-stent restnosis(Dr. Gerrie Nordmann)  . CARDIAC CATHETERIZATION  06/11/1998   L main with 20% narrowing in distal 1/3; LAD with 1st diagonla having 70% ostial narrowing, 2nd diagonal with 40% narrowing in prox third, LIMA & RIMA widely patent; in-stent restenosis of RCIA with successful PTA and new prox SVTRCA stent for residual disease (Dr. Marella Chimes)  . CARDIAC DEFIBRILLATOR PLACEMENT  06/2005   [employer] HE - ischemic cardiomyopathy - Dr. Marella Chimes  . CAROTID ENDARTERECTOMY Left 01/04/12  . CORONARY ANGIOPLASTY  01/08/2004   cutting balloon atherectomy & percutaneous intervention of RCIA in-stent restenosis (Dr. Marella Chimes)  . CORONARY ARTERY BYPASS GRAFT  11/04/1985   x2 - PDA and sequential DX-OM (Dr. Redmond Pulling)  . ENDARTERECTOMY  01/04/2012   Procedure: ENDARTERECTOMY CAROTID;left  Surgeon: Hinda Lenis, MD;  Location: [facility];  Service: Vascular;  Laterality: Left;  with patch angioplasty  . ICD GENERATOR CHANGE  05/02/2010   [employer]  . ILIAC ARTERY STENT Bilateral 03/1997   and L SFA PTA  . Iron infusion  June 16, 2012  . LEFT HEART CATHETERIZATION WITH CORONARY ANGIOGRAM [name] 07/06/2014   Procedure: LEFT HEART CATHETERIZATION WITH CORONARY ANGIOGRAM;  Surgeon: Peter M Martinique, MD;  Location: [facility] CATH LAB;  Service: Cardiovascular;  Laterality: [name];  . NM MYOCAR PERF WALL MOTION  09/2012   lexiscan myoview - mod-severe perfusion defect r/t infarct or scar w/mild periinfarct ishcemia in basal inferior, mid inferior, apical inferior, basal inferolateral & mid inferoalteral regions - EF 21% low risk scan  . POLYPECTOMY    . RENAL ARTERY STENT Left 03/24/2004   [id number] Genesis stent (Dr. RMarella Chimes  .  TRANSTHORACIC ECHOCARDIOGRAM  12/2012   EF 30-35; LV mod to severely dilated, mod concentric hypertrophy, severe hypokinesis of inferolateral myocardium, moderate hypokineis of anteroseptal region, grade 1 diastolic dysfunction; mod MR; LA mod-severely dialted; RV mod dialted; RA mildly dilated; PA peak pressure 360mg    Current Outpatient Prescriptions  Medication Sig Dispense Refill  . albuterol (PROVENTIL HFA;VENTOLIN HFA) 108 (90 BASE) MCG/ACT inhaler Inhale 2 puffs into the lungs every 6 (six) hours as needed for wheezing or shortness of breath. 3 Inhaler 4  . albuterol (PROVENTIL) (2.5 MG/3ML) 0.083% nebulizer solution Take 3 mLs (2.5 mg total) by nebulization every 6 (six) hours. 120 mL 3  . amiodarone (PACERONE) 200 MG tablet TAKE 1 TABLET BY MOUTH  DAILY 90 tablet 3  . aspirin 81 MG tablet Take 81 mg by mouth daily.    .Marland Kitchen  atorvastatin (LIPITOR) 40 MG tablet TAKE 1 TABLET BY MOUTH  DAILY 90 tablet 3  . benzonatate (TESSALON) 100 MG capsule Take 1 capsule (100 mg total) by mouth 3 (three) times daily as needed for cough. 20 capsule 0  . carvedilol (COREG) 25 MG tablet TAKE 1 TABLET BY MOUTH  TWICE A DAY WITH MEALS 180 tablet 3  . clopidogrel (PLAVIX) 75 MG tablet TAKE 1 TABLET BY MOUTH  DAILY 90 tablet 3  . ENTRESTO 97-103 MG TAKE 1 TABLET BY MOUTH TWICE A DAY 60 tablet 6  . finasteride (PROSCAR) 5 MG tablet Take 5 mg by mouth daily.    . fluticasone (FLONASE) 50 MCG/ACT nasal spray Instill 1 spray in each  nostril daily 32 g 3  . furosemide (LASIX) 80 MG tablet TAKE 1 TABLET BY MOUTH TWO  TIMES DAILY 180 tablet 3  . Glycopyrrolate-Formoterol (BEVESPI AEROSPHERE) 9-4.8 MCG/ACT AERO Inhale 2 puffs into the lungs 2 (two) times daily. 2 Inhaler 0  . ipratropium (ATROVENT HFA) 17 MCG/ACT inhaler Inhale 2 puffs into the lungs every 4 (four) hours as needed for wheezing. 1 Inhaler 12  . ipratropium (ATROVENT) 0.03 % nasal spray Place 2 sprays into both nostrils 2 (two) times daily. Do not use for  more than 5days. 30 mL 0  . isosorbide mononitrate (IMDUR) 60 MG 24 hr tablet TAKE 1 TABLET BY MOUTH  DAILY 90 tablet 3  . levothyroxine (SYNTHROID, LEVOTHROID) 50 MCG tablet TAKE 1 TABLET BY MOUTH  DAILY BEFORE BREAKFAST 90 tablet 1  . Multiple Vitamins-Minerals (MENS 50+ MULTI VITAMIN/MIN) TABS Take 1 tablet by mouth daily.     . multivitamin-lutein (OCUVITE-LUTEIN) CAPS Take 1 capsule by mouth daily.      . nitroGLYCERIN (NITROSTAT) 0.4 MG SL tablet DISSOLVE ONE TABLET UNDER  THE TONGUE EVERY 5 MINUTES  AS NEEDED FOR CHEST PAIN  (UP TO 3 TABS IN 15 MINUTES THEN CALL 911) 75 tablet 0  . NON FORMULARY at bedtime. CPAP And during the day as needed    . spironolactone (ALDACTONE) 25 MG tablet Take 1 tablet by mouth  daily 90 tablet 3   Current Facility-Administered Medications  Medication Dose Route Frequency Provider Last Rate Last Dose  . ipratropium-albuterol (DUONEB) 0.5-2.5 (3) MG/3ML nebulizer solution 3 mL  3 mL Nebulization Once [name], MD        Physical Exam BP (!) 108/57   Pulse 62   Resp 20   Ht '5\' 8"'$  (1.727 m)   Wt 244 lb (110.7 kg)   SpO2 98% Comment: RA  BMI 37.10 kg/m  Obese 71 year old man in no acute distress In good spirits Alert and oriented 3 with no focal neurologic deficit No cervical or supraclavicular adenopathy Cardiac regular rate and rhythm with a faint systolic murmur throat upper sternal border, no gallops Lungs diminished breath sounds bilaterally, no wheezing Abdomen obese soft nontender  Diagnostic Tests: NUCLEAR MEDICINE PET SKULL BASE TO THIGH  TECHNIQUE: 12.5 mCi F-18 FDG was injected intravenously. Full-ring PET imaging was performed from the skull base to thigh after the radiotracer. CT data was obtained and used for attenuation correction and anatomic localization.  FASTING BLOOD GLUCOSE:  Value: 100 mg/dl  COMPARISON:  Chest CT on 11/07/2016 and AP CT on 02/19/2015  FINDINGS: NECK  No hypermetabolic lymph nodes in  the neck.  CHEST  No hypermetabolic mediastinal or hilar nodes. 12 mm spiculated nodule in the anterior left upper lobe is hypermetabolic, with SUV max of 3.9.  No other suspicious pulmonary nodules identified on CT. Mild emphysema noted.  Pacemaker leads in right heart. Stable mild cardiomegaly. Aortic and coronary artery atherosclerosis. Prior CABG.  ABDOMEN/PELVIS  No abnormal hypermetabolic activity within the liver, pancreas, adrenal glands, or spleen. No hypermetabolic lymph nodes in the abdomen or pelvis.  Prior stent graft repair of abdominal aortic aneurysm. Stable native abdominal aortic aneurysm measuring 5.2 cm. Stable mildly enlarged prostate.  SKELETON  No focal hypermetabolic activity to suggest skeletal metastasis.  IMPRESSION: 12 mm spiculated right upper lobe nodule is hypermetabolic, and highly suspicious for bronchogenic carcinoma.  No evidence of thoracic nodal or distant metastatic disease.  Mild emphysema. Prior stent graft repair of abdominal aortic aneurysm.   Electronically Signed   By: Earle Gell M.D.   On: 01/17/2017 12:23 I personally reviewed the PET CT and agree with the findings noted above  Impression: [name] is a 71 year old gentleman with a remote history of tobacco abuse who has a groundglass opacity in the right upper lobe. This is now a mixed lesion. There does appear to be a small solid component. On PET CT this is hypermetabolic with an SUV of 3.7.  I once again informed [name] that this likely is a low-grade lung cancer. The differential diagnosis includes infectious or inflammatory nodules as well. My recommendation was that we proceed with electromagnetic navigational bronchoscopy for biopsy and fiducial placement. He is not a candidate for surgical resection but would be a good candidate for stereotactic radiation. He says that he has decided he would agree to have radiation.  I discussed the general nature  of the procedure with [name] and his wife and daughter. They understand this would be done in the operating room under general anesthesia. It is an endoscopic Procedure that does not require any incisions. Typically do this on an outpatient basis. They understand there is no guarantee will be able to make a definitive diagnosis. We will plan to place fiducial markers at the time of the procedure to assist with stereotactic radiation. I reviewed the indications, risks, benefits, and alternatives. They understand the risk include those associated with general anesthesia such as MI, stroke, death, as well as those associated with procedure such as bleeding, pneumothorax, failure to make a diagnosis. He understands and accepts the risks and wishes to proceed.  Plan: Electromagnetic navigational bronchoscopy for biopsy and fiducial placement on Thursday, March 8  [name], MD [facility] [phone]

## 2017-02-12 ENCOUNTER — Ambulatory Visit
Admission: RE | Admit: 2017-02-12 | Discharge: 2017-02-12 | Disposition: A | Discharge status: Home / Self Care | Payer: Medicare Other | Source: Ambulatory Visit | Attending: Thoracic Surgery (Cardiothoracic Vascular Surgery) | Admitting: Thoracic Surgery (Cardiothoracic Vascular Surgery)

## 2017-02-12 DIAGNOSIS — R911 Solitary pulmonary nodule: Secondary | ICD-10-CM

## 2017-02-12 DIAGNOSIS — Z01818 Encounter for other preprocedural examination: Secondary | ICD-10-CM

## 2017-02-12 DIAGNOSIS — J439 Emphysema, unspecified: Secondary | ICD-10-CM | POA: Diagnosis not present

## 2017-02-13 ENCOUNTER — Ambulatory Visit (INDEPENDENT_AMBULATORY_CARE_PROVIDER_SITE_OTHER): Payer: Medicare Other | Admitting: Internal Medicine

## 2017-02-13 ENCOUNTER — Encounter: Payer: Self-pay | Admitting: Internal Medicine

## 2017-02-13 ENCOUNTER — Ambulatory Visit (INDEPENDENT_AMBULATORY_CARE_PROVIDER_SITE_OTHER)
Admission: RE | Admit: 2017-02-13 | Discharge: 2017-02-13 | Disposition: A | Discharge status: Home / Self Care | Payer: Medicare Other | Source: Ambulatory Visit | Attending: Internal Medicine | Admitting: Internal Medicine

## 2017-02-13 VITALS — BP 86/42 | HR 74 | Temp 98.9°F | Resp 16 | Ht 68.0 in | Wt 247.5 lb

## 2017-02-13 DIAGNOSIS — R059 Cough, unspecified: Secondary | ICD-10-CM

## 2017-02-13 DIAGNOSIS — J449 Chronic obstructive pulmonary disease, unspecified: Secondary | ICD-10-CM | POA: Diagnosis not present

## 2017-02-13 DIAGNOSIS — J441 Chronic obstructive pulmonary disease with (acute) exacerbation: Secondary | ICD-10-CM | POA: Diagnosis not present

## 2017-02-13 DIAGNOSIS — I2510 Atherosclerotic heart disease of native coronary artery without angina pectoris: Secondary | ICD-10-CM

## 2017-02-13 DIAGNOSIS — R05 Cough: Secondary | ICD-10-CM

## 2017-02-13 DIAGNOSIS — J988 Other specified respiratory disorders: Secondary | ICD-10-CM

## 2017-02-13 LAB — POCT EXHALED NITRIC OXIDE: FeNO level (ppb): 56

## 2017-02-13 MED ORDER — PROMETHAZINE-DM 6.25-15 MG/5ML PO SYRP: 5.0000 mL | ORAL_SOLUTION | Freq: Four times a day (QID) | ORAL | 0 refills | Status: DC | PRN

## 2017-02-13 MED ORDER — CEFDINIR 300 MG PO CAPS: 300.0000 mg | ORAL_CAPSULE | Freq: Two times a day (BID) | ORAL | 3 refills | Status: AC

## 2017-02-13 MED ORDER — FLUTICASONE-UMECLIDIN-VILANT 100-62.5-25 MCG/INH IN AEPB: 1.0000 | INHALATION_SPRAY | Freq: Every day | RESPIRATORY_TRACT | 5 refills | Status: DC

## 2017-02-13 MED ORDER — GLYCOPYRROLATE-FORMOTEROL 9-4.8 MCG/ACT IN AERO: 2.0000 | INHALATION_SPRAY | Freq: Two times a day (BID) | RESPIRATORY_TRACT | Status: DC

## 2017-02-13 NOTE — Patient Instructions (Signed)
Cough, Adult Coughing is a reflex that clears your throat and your airways. Coughing helps to heal and protect your lungs. It is normal to cough occasionally, but a cough that happens with other symptoms or lasts a long time may be a sign of a condition that needs treatment. A cough may last only 2-3 weeks (acute), or it may last longer than 8 weeks (chronic). What are the causes? Coughing is commonly caused by:  Breathing in substances that irritate your lungs.  A viral or bacterial respiratory infection.  Allergies.  Asthma.  Postnasal drip.  Smoking.  Acid backing up from the stomach into the esophagus (gastroesophageal reflux).  Certain medicines.  Chronic lung problems, including COPD (or rarely, lung cancer).  Other medical conditions such as heart failure.  Follow these instructions at home: Pay attention to any changes in your symptoms. Take these actions to help with your discomfort:  Take medicines only as told by your health care provider. ? If you were prescribed an antibiotic medicine, take it as told by your health care provider. Do not stop taking the antibiotic even if you start to feel better. ? Talk with your health care provider before you take a cough suppressant medicine.  Drink enough fluid to keep your urine clear or pale yellow.  If the air is dry, use a cold steam vaporizer or humidifier in your bedroom or your home to help loosen secretions.  Avoid anything that causes you to cough at work or at home.  If your cough is worse at night, try sleeping in a semi-upright position.  Avoid cigarette smoke. If you smoke, quit smoking. If you need help quitting, ask your health care provider.  Avoid caffeine.  Avoid alcohol.  Rest as needed.  Contact a health care provider if:  You have new symptoms.  You cough up pus.  Your cough does not get better after 2-3 weeks, or your cough gets worse.  You cannot control your cough with suppressant  medicines and you are losing sleep.  You develop pain that is getting worse or pain that is not controlled with pain medicines.  You have a fever.  You have unexplained weight loss.  You have night sweats. Get help right away if:  You cough up blood.  You have difficulty breathing.  Your heartbeat is very fast. This information is not intended to replace advice given to you by your health care provider. Make sure you discuss any questions you have with your health care provider. Document Released: 05/26/2011 Document Revised: 05/04/2016 Document Reviewed: 02/03/2015 Elsevier Interactive Patient Education  2017 [employer]  

## 2017-02-13 NOTE — Progress Notes (Signed)
Subjective:  Patient ID: [name], male    DOB: [birth year]  Age: 71 y.o. MRN: [id number]  CC: Cough (SOB, fatigue, 1 week,, phlegm, )   HPI [name] presents for A 5 day history of cough productive of green/yellow phlegm with sore throat, chills, and night sweats but no fever. He also reports shortness of breath and wheezing but denies chest pain, edema, or hemoptysis. He has not been using the bevespi inhaler.  Outpatient Medications Prior to Visit  Medication Sig Dispense Refill  . albuterol (PROVENTIL HFA;VENTOLIN HFA) 108 (90 BASE) MCG/ACT inhaler Inhale 2 puffs into the lungs every 6 (six) hours as needed for wheezing or shortness of breath. 3 Inhaler 4  . albuterol (PROVENTIL) (2.5 MG/3ML) 0.083% nebulizer solution Take 3 mLs (2.5 mg total) by nebulization every 6 (six) hours. 120 mL 3  . amiodarone (PACERONE) 200 MG tablet TAKE 1 TABLET BY MOUTH  DAILY 90 tablet 3  . aspirin 81 MG tablet Take 81 mg by mouth daily.    Marland Kitchen atorvastatin (LIPITOR) 40 MG tablet TAKE 1 TABLET BY MOUTH  DAILY 90 tablet 3  . carvedilol (COREG) 25 MG tablet TAKE 1 TABLET BY MOUTH  TWICE A DAY WITH MEALS 180 tablet 3  . clopidogrel (PLAVIX) 75 MG tablet TAKE 1 TABLET BY MOUTH  DAILY 90 tablet 3  . ENTRESTO 97-103 MG TAKE 1 TABLET BY MOUTH TWICE A DAY 60 tablet 6  . finasteride (PROSCAR) 5 MG tablet Take 5 mg by mouth daily.    . fluticasone (FLONASE) 50 MCG/ACT nasal spray Instill 1 spray in each  nostril daily 32 g 3  . furosemide (LASIX) 80 MG tablet TAKE 1 TABLET BY MOUTH TWO  TIMES DAILY 180 tablet 3  . ipratropium (ATROVENT HFA) 17 MCG/ACT inhaler Inhale 2 puffs into the lungs every 4 (four) hours as needed for wheezing. 1 Inhaler 12  . isosorbide mononitrate (IMDUR) 60 MG 24 hr tablet TAKE 1 TABLET BY MOUTH  DAILY 90 tablet 3  . levothyroxine (SYNTHROID, LEVOTHROID) 50 MCG tablet TAKE 1 TABLET BY MOUTH  DAILY BEFORE BREAKFAST 90 tablet 1  . nitroGLYCERIN (NITROSTAT) 0.4 MG SL tablet DISSOLVE  ONE TABLET UNDER  THE TONGUE EVERY 5 MINUTES  AS NEEDED FOR CHEST PAIN  (UP TO 3 TABS IN 15 MINUTES THEN CALL 911) 75 tablet 0  . NON FORMULARY at bedtime. CPAP And during the day as needed    . spironolactone (ALDACTONE) 25 MG tablet Take 1 tablet by mouth  daily 90 tablet 3  . Glycopyrrolate-Formoterol (BEVESPI AEROSPHERE) 9-4.8 MCG/ACT AERO Inhale 2 puffs into the lungs 2 (two) times daily. 2 Inhaler 0  . Multiple Vitamins-Minerals (MENS 50+ MULTI VITAMIN/MIN) TABS Take 1 tablet by mouth daily.     . multivitamin-lutein (OCUVITE-LUTEIN) CAPS Take 1 capsule by mouth 2 (two) times daily.     Marland Kitchen ipratropium-albuterol (DUONEB) 0.5-2.5 (3) MG/3ML nebulizer solution 3 mL      No facility-administered medications prior to visit.     ROS Review of Systems  Constitutional: Positive for chills. Negative for activity change, appetite change, diaphoresis, fatigue, fever and unexpected weight change.  HENT: Positive for sore throat. Negative for facial swelling, sinus pressure and trouble swallowing.   Eyes: Negative.   Respiratory: Positive for cough, shortness of breath and wheezing. Negative for choking.   Cardiovascular: Negative for chest pain, palpitations and leg swelling.  Gastrointestinal: Negative.  Negative for abdominal pain, constipation, diarrhea, nausea and vomiting.  Endocrine: Negative.   Genitourinary: Negative.  Negative for difficulty urinating.  Musculoskeletal: Negative for back pain, myalgias and neck pain.  Skin: Negative.  Negative for color change, pallor and rash.  Allergic/Immunologic: Negative.   Neurological: Negative.  Negative for dizziness, weakness and numbness.  Hematological: Negative.  Negative for adenopathy. Does not bruise/bleed easily.  Psychiatric/Behavioral: Negative.     Objective:  BP (!) 86/42   Pulse 74   Temp 98.9 F (37.2 C) (Oral)   Resp 16   Ht '5\' 8"'$  (1.727 m)   Wt 247 lb 8 oz (112.3 kg)   SpO2 93%   BMI 37.63 kg/m   BP Readings from  Last 3 Encounters:  02/13/17 (!) 86/42  02/06/17 (!) 108/57  01/17/17 (!) 119/48    Wt Readings from Last 3 Encounters:  02/13/17 247 lb 8 oz (112.3 kg)  02/06/17 244 lb (110.7 kg)  01/17/17 242 lb 8 oz (110 kg)    Physical Exam  Constitutional: He is oriented to person, place, and time.  Non-toxic appearance. He does not have a sickly appearance. He does not appear ill. No distress.  HENT:  Mouth/Throat: Oropharynx is clear and moist. No oropharyngeal exudate.  Eyes: Conjunctivae are normal. Right eye exhibits no discharge. Left eye exhibits no discharge. No scleral icterus.  Neck: Normal range of motion. Neck supple. No JVD present. No tracheal deviation present. No thyromegaly present.  Cardiovascular: Normal rate, regular rhythm, normal heart sounds and intact distal pulses.  Exam reveals no gallop and no friction rub.   No murmur heard. Pulmonary/Chest: Effort normal. No accessory muscle usage or stridor. No tachypnea. No respiratory distress. He has decreased breath sounds in the right upper field, the right middle field, the right lower field, the left upper field, the left middle field and the left lower field. He has no wheezes. He has no rhonchi. He has no rales. He exhibits no tenderness.  Abdominal: Soft. Bowel sounds are normal. He exhibits no distension and no mass. There is no tenderness. There is no rebound and no guarding.  Musculoskeletal: Normal range of motion. He exhibits no edema, tenderness or deformity.  Lymphadenopathy:    He has no cervical adenopathy.  Neurological: He is oriented to person, place, and time.  Skin: Skin is warm and dry. No rash noted. He is not diaphoretic. No erythema. No pallor.  Vitals reviewed.   Lab Results  Component Value Date   WBC 5.7 01/17/2017   HGB 15.1 01/17/2017   HCT 45.6 01/17/2017   PLT 139 (L) 01/17/2017   GLUCOSE 145 (H) 12/21/2016   CHOL 127 08/18/2016   TRIG 144 08/18/2016   HDL 29 (L) 08/18/2016   LDLCALC 69  08/18/2016   ALT 14 (L) 12/21/2016   AST 17 12/21/2016   NA 139 12/21/2016   K 4.3 12/21/2016   CL 106 12/21/2016   CREATININE 0.98 12/21/2016   BUN 25 (H) 12/21/2016   CO2 23 12/21/2016   TSH 3.470 12/21/2016   PSA 0.51 04/18/2012   INR 1.0 10/05/2014   HGBA1C 6.3 02/17/2015    Ct Super D Chest Wo Contrast  Result Date: 02/12/2017 CLINICAL DATA:  Preop exam.  Right upper lobe pulmonary nodule EXAM: CT CHEST WITHOUT CONTRAST TECHNIQUE: Multidetector CT imaging of the chest was performed using thin slice collimation for electromagnetic bronchoscopy planning purposes, without intravenous contrast. COMPARISON:  01/17/2017 FINDINGS: Cardiovascular: Normal heart size. No pericardial effusion identified. Aortic atherosclerosis. Previous CABG procedure. Mediastinum/Nodes: No enlarged mediastinal, hilar,  or axillary lymph nodes. Thyroid gland, trachea, and esophagus demonstrate no significant findings. Lungs/Pleura: Moderate to advanced changes of centrilobular emphysema identified. Right upper lobe pulmonary nodule is again identified measuring 1.3 cm, image 42 of series 4. No additional suspicious pulmonary nodules or masses. No airspace consolidation or atelectasis. Upper Abdomen: Previous stent graft repair of the abdominal aorta. Musculoskeletal: No chest wall mass or suspicious bone lesions identified. IMPRESSION: 1. Again noted is pulmonary nodule within the right upper lobe which was recently described as FDG avid and suspicious for bronchogenic carcinoma. 2. Emphysema 3. Aortic atherosclerosis.  Previous CABG procedure. Electronically Signed   By: Kerby Moors M.D.   On: 02/12/2017 11:57    Assessment & Plan:   Castin was seen today for cough.  Diagnoses and all orders for this visit:  Cough- his chest x-ray is normal, will control the cough with Phenergan DM. -     DG Chest 2 View; Future -     POCT EXHALED NITRIC OXIDE -     promethazine-dextromethorphan (PROMETHAZINE-DM) 6.25-15  MG/5ML syrup; Take 5 mLs by mouth 4 (four) times daily as needed for cough.  COPD exacerbation ([place]) -     Discontinue: Glycopyrrolate-Formoterol (BEVESPI AEROSPHERE) 9-4.8 MCG/ACT AERO; Inhale 2 puffs into the lungs 2 (two) times daily. -     Fluticasone-Umeclidin-Vilant (TRELEGY ELLIPTA) 100-62.5-25 MCG/INH AEPB; Inhale 1 puff into the lungs daily.  COPD mixed type ([place])- his FeNp score is above 50 so I have asked asked to start an inhaler that has an ICS in addition to the LABA/LAMA (Trelegy) -     Discontinue: Glycopyrrolate-Formoterol (BEVESPI AEROSPHERE) 9-4.8 MCG/ACT AERO; Inhale 2 puffs into the lungs 2 (two) times daily. -     Fluticasone-Umeclidin-Vilant (TRELEGY ELLIPTA) 100-62.5-25 MCG/INH AEPB; Inhale 1 puff into the lungs daily.  RTI (respiratory tract infection)- will treat the infection with omnicef -     cefdinir (OMNICEF) 300 MG capsule; Take 1 capsule (300 mg total) by mouth 2 (two) times daily. -     promethazine-dextromethorphan (PROMETHAZINE-DM) 6.25-15 MG/5ML syrup; Take 5 mLs by mouth 4 (four) times daily as needed for cough.   I have discontinued Mr. Petrenko's multivitamin-lutein, MENS 50+ MULTI VITAMIN/MIN, Glycopyrrolate-Formoterol, and Glycopyrrolate-Formoterol. I am also having him start on cefdinir, Fluticasone-Umeclidin-Vilant, and promethazine-dextromethorphan. Additionally, I am having him maintain his aspirin, finasteride, NON FORMULARY, albuterol, ipratropium, fluticasone, spironolactone, levothyroxine, amiodarone, atorvastatin, carvedilol, clopidogrel, nitroGLYCERIN, albuterol, furosemide, isosorbide mononitrate, and ENTRESTO. We will stop administering ipratropium-albuterol.  Meds ordered this encounter  Medications  . DISCONTD: Glycopyrrolate-Formoterol (BEVESPI AEROSPHERE) 9-4.8 MCG/ACT AERO    Sig: Inhale 2 puffs into the lungs 2 (two) times daily.    Dispense:  3 Inhaler    Refill:  03    Order Specific Question:   Lot Number?    Answer:   [id number]     Order Specific Question:   Expiration Date?    Answer:   11/10/2016    Order Specific Question:   Manufacturer?    Answer:   [employer] [[id number]]    Order Specific Question:   Quantity    Answer:   2    Comments:   inhalers  . cefdinir (OMNICEF) 300 MG capsule    Sig: Take 1 capsule (300 mg total) by mouth 2 (two) times daily.    Dispense:  20 capsule    Refill:  3  . Fluticasone-Umeclidin-Vilant (TRELEGY ELLIPTA) 100-62.5-25 MCG/INH AEPB    Sig: Inhale 1 puff into the lungs daily.  Dispense:  30 each    Refill:  5  . promethazine-dextromethorphan (PROMETHAZINE-DM) 6.25-15 MG/5ML syrup    Sig: Take 5 mLs by mouth 4 (four) times daily as needed for cough.    Dispense:  118 mL    Refill:  0     Follow-up: Return in about 3 weeks (around 03/06/2017).  [name], MD

## 2017-02-13 NOTE — Progress Notes (Signed)
Pre-visit discussion using our clinic review tool. No additional management support is needed unless otherwise documented below in the visit note.  

## 2017-02-14 ENCOUNTER — Encounter (HOSPITAL_COMMUNITY)
Admission: RE | Admit: 2017-02-14 | Discharge: 2017-02-14 | Disposition: A | Discharge status: Home / Self Care | Payer: Medicare Other | Source: Ambulatory Visit | Attending: Thoracic Surgery (Cardiothoracic Vascular Surgery) | Admitting: Thoracic Surgery (Cardiothoracic Vascular Surgery)

## 2017-02-14 ENCOUNTER — Encounter (HOSPITAL_COMMUNITY): Payer: Self-pay

## 2017-02-14 DIAGNOSIS — Z01812 Encounter for preprocedural laboratory examination: Secondary | ICD-10-CM | POA: Insufficient documentation

## 2017-02-14 DIAGNOSIS — R911 Solitary pulmonary nodule: Secondary | ICD-10-CM | POA: Diagnosis not present

## 2017-02-14 HISTORY — DX: Dyspnea, unspecified: R06.00

## 2017-02-14 LAB — COMPREHENSIVE METABOLIC PANEL
ALT: 14 U/L — ABNORMAL LOW (ref 17–63)
AST: 15 U/L (ref 15–41)
Albumin: 3.4 g/dL — ABNORMAL LOW (ref 3.5–5.0)
Alkaline Phosphatase: 54 U/L (ref 38–126)
Anion gap: 9 (ref 5–15)
BUN: 42 mg/dL — ABNORMAL HIGH (ref 6–20)
CO2: 26 mmol/L (ref 22–32)
Calcium: 8.9 mg/dL (ref 8.9–10.3)
Chloride: 104 mmol/L (ref 101–111)
Creatinine, Ser: 1.73 mg/dL — ABNORMAL HIGH (ref 0.61–1.24)
GFR calc Af Amer: 44 mL/min — ABNORMAL LOW (ref >60)
GFR calc non Af Amer: 38 mL/min — ABNORMAL LOW (ref >60)
Glucose, Bld: 146 mg/dL — ABNORMAL HIGH (ref 65–99)
Potassium: 4.4 mmol/L (ref 3.5–5.1)
Sodium: 139 mmol/L (ref 135–145)
Total Bilirubin: 1.2 mg/dL (ref 0.3–1.2)
Total Protein: 8.6 g/dL — ABNORMAL HIGH (ref 6.5–8.1)

## 2017-02-14 LAB — CBC
HCT: 39.1 % (ref 39.0–52.0)
Hemoglobin: 12.6 g/dL — ABNORMAL LOW (ref 13.0–17.0)
MCH: 31 pg (ref 26.0–34.0)
MCHC: 32.2 g/dL (ref 30.0–36.0)
MCV: 96.1 fL (ref 78.0–100.0)
Platelets: 126 10*3/uL — ABNORMAL LOW (ref 150–400)
RBC: 4.07 MIL/uL — ABNORMAL LOW (ref 4.22–5.81)
RDW: 14.4 % (ref 11.5–15.5)
WBC: 16 10*3/uL — ABNORMAL HIGH (ref 4.0–10.5)

## 2017-02-14 LAB — PROTIME-INR
INR: 1.18
Prothrombin Time: 15.1 seconds (ref 11.4–15.2)

## 2017-02-14 LAB — APTT: aPTT: 30 seconds (ref 24–36)

## 2017-02-14 NOTE — Pre-Procedure Instructions (Signed)
Dontavion L Tonkovich  02/14/2017      [id number] [place] [address]Phone: [phone]: [phone] [address] [place]Phone: [phone]: [phone] [place]- [id number]: [phone]: [phone]   Your procedure is scheduled on 02-15-2017   Thursday .  [facility]   Call this number if you have problems the morning of surgery:  [phone]   Remember:  Do not eat food or drink liquids after midnight.   Take these medicines the morning of surgery with A SIP OF WATER Albuterol inhaler if needed,albuterol nebulizer,amiodarone(Pacerone),carvedilol(Coreg),Omincef,Entresto,Finasteride(Proscar),Flonase nasal spray,Ellipta inhaler,atrovent inhaler if needed,Isosorbide(Imdur),Levothyroxine(Synthroid),   Do not wear jewelry,  Do not wear lotions, powders, or perfumes, or deoderant.  Do not shave 48 hours prior to surgery.  Men may shave face and neck.  Do not bring valuables to the hospital.  [facility] belongings or [facility]ables.  Contacts, dentures or bridgework may not be worn into surgery.  Leave your suitcase in the car.  After surgery it may be brought to your room.  For patients admitted to the hospital, discharge time will be determined by your treatment team.  Patients discharged the day of surgery will not be allowed to drive home.      Special Instructions: [place] - Preparing for Surgery  Before surgery, you can play an important role.  Because skin is not sterile, your skin needs to be as free of germs as possible.  You can reduce the number of germs on you skin by washing with CHG (chlorahexidine gluconate) soap  before surgery.  CHG is an antiseptic cleaner which kills germs and bonds with the skin to continue killing germs even after washing.  Please DO NOT use if you have an allergy to CHG or antibacterial soaps.  If your skin becomes reddened/irritated stop using the CHG and inform your nurse when you arrive at Short Stay.  Do not shave (including legs and underarms) for at least 48 hours prior to the first CHG shower.  You may shave your face.  Please follow these instructions carefully:   1.  Shower with CHG Soap the night before surgery and the   morning of Surgery.  2.  If you choose to wash your hair, wash your hair first as usual with your normal shampoo.  3.  After you shampoo, rinse your hair and body thoroughly to remove the  Shampoo.  4.  Use CHG as you would any other liquid soap.  You can apply chg directly  to the skin and wash gently with scrungie or a clean washcloth.  5.  Apply the CHG Soap to your body ONLY FROM THE NECK DOWN.   Do not use on open wounds or open sores.  Avoid contact with your eyes,  ears, mouth and genitals (private parts).  Wash genitals (private parts) with your normal soap.  6.  Wash thoroughly, paying special attention to the area where your surgery will be performed.  7.  Thoroughly rinse your body with warm water from the neck down.  8.  DO NOT shower/wash with your normal soap after using and rinsing o  the CHG Soap.  9.  Pat yourself dry with a clean towel.            10.  Wear clean pajamas.            11.  Place clean sheets on your bed the night of your first shower and do not sleep with pets.  Day of Surgery  Do not apply any lotions/deodorants the morning of surgery.  Please wear clean clothes to the hospital/surgery center.

## 2017-02-14 NOTE — Progress Notes (Signed)
Peri-operative Device Orders faxed to [facility]   [employer] Rep paged.

## 2017-02-14 NOTE — Progress Notes (Signed)
[name] for [employer] returned call. He said to call hem the morning of pt. Procedure and let him know if he needs to come over. Phone # [phone]

## 2017-02-15 NOTE — Progress Notes (Addendum)
Anesthesia Chart Review:  Pt is a 71 year old male scheduled for video bronchoscopy with endobronchial navigation, placement of fuducial on 02/21/2017 with Modesto Charon, M.D.  - PCP is [name], MD, last office visit 02/13/17 and dx with COPD exacerbation, started on omnicef.  Dr. Roxan Hockey is aware.   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 Former smoker. BMI 37.5  For anesthesia history, he reports claustrophobia, and back pain if he has to lie on his back for > 4 hours.  BP (!) 86/40 Comment: taken manually  Pulse 70   Temp 37 C   Resp 18   Ht '5\' 8"'$  (1.727 m)   Wt 247 lb (112 kg)   SpO2 95%   BMI 37.56 kg/m    Medications include: Albuterol, amiodarone, ASA 81 mg, Lipitor, carvedilol, Omnicef, Plavix, entresto, trelegy ellipta, Lasix, Atrovent, Imdur, levothyroxine, spironolactone  Preoperative labs reviewed.  Cr 1.73, BUN 42. Baseline Cr is < 1.  I notified Ryan in Dr. Leonarda Salon office.   CXR 02/13/17:  1. Hyperinflation with emphysematous changes. No acute pulmonary infiltrate 2. Stable borderline to mild cardiomegaly without overt failure 3. CT demonstrated right upper lobe pulmonary nodule is not well demonstrated radiographically.  EKG 11/21/16:  Electronic ventricular pacemaker.  Carotid duplex 10/06/16: Known occlusion of R ICA. Patent L CEA site with no evidence of hyperplasia or restenosis.  Ivar duplex 10/06/16: Patent endovascular abdominal aortic repair with maximum diameter 5.09 cm  5.58 cm  Echo 04/14/16:  - Left ventricle: Diffuse hypokinesis and inferior wall akinesis. The cavity size was severely dilated. Systolic function was severely reduced. The estimated ejection fraction was in the range of 20% to 25%. Diffuse hypokinesis. Doppler parameters are consistent with abnormal left ventricular relaxation (grade 1 diastolic dysfunction). Doppler parameters are consistent with elevated ventricular end-diastolic filling pressure. - Aortic valve: Trileaflet; moderately thickened, moderately calcified leaflets. Sclerosis without stenosis. There was no regurgitation. - Aortic root: The aortic root was normal in size. - Mitral valve: Calcified annulus. Mildly thickened leaflets There was mild regurgitation. - Left atrium: The atrium was moderately dilated. - Right ventricle: The cavity size was normal. Wall thickness was normal. Systolic function was mildly reduced. - Right atrium: Pacer wire or catheter noted in right atrium. - Tricuspid valve: There was mild regurgitation. - Pericardium, extracardiac: There was no pericardial effusion.  Cardiac cath 7/2/715:  - LM: normal - LAD: large vessel.  40% stenosis immediately after takeoff of large septal perforator.  2 large diagonal branches without significant disease.  - CX: occluded proximally. Left to left collaterals.  - RCA: occluded proximally following the conus branch. Right to right and left to left collaterals.  - SVGs: not imaged since known to be occluded on multiple prior studies - LV systolic function normal, LV size increased. EF estimated at 30%. Extensive inferior  akinesis. Moderate mitral regurgitation.  Willeen Cass, FNP-BC [facility] Short Stay Surgical  Center/Anesthesiology Phone: [phone] 02/15/2017 3:56 PM  Addendum:   Pt has an appointment 02/20/17 with PCP for f/u renal function and URI. Will revisit chart when notes available.   Willeen Cass, FNP-BC [facility] Short Stay Surgical Center/Anesthesiology Phone: [phone] 02/19/2017 9:05 AM

## 2017-02-16 ENCOUNTER — Telehealth: Payer: Self-pay | Admitting: *Deleted

## 2017-02-16 NOTE — Telephone Encounter (Signed)
I was notified by the hospital about this patients pre-op blood work.  I made Dr. Roxan Hockey aware.  He wants patient to follow-up with his PCP regarding this issue.  I called and spoke with the patients wife.  She was going to call his PCP to get an appt for Monday, 3/12 to follow-up on renal function and his upper respiratory infection.  Patient is currently scheduled for his Bronchoscopy on Wednesday 3/14.  Patient will call back if he has further questions.

## 2017-02-20 ENCOUNTER — Ambulatory Visit (INDEPENDENT_AMBULATORY_CARE_PROVIDER_SITE_OTHER): Payer: Medicare Other | Admitting: Internal Medicine

## 2017-02-20 ENCOUNTER — Ambulatory Visit (INDEPENDENT_AMBULATORY_CARE_PROVIDER_SITE_OTHER): Payer: Medicare Other | Admitting: *Deleted

## 2017-02-20 ENCOUNTER — Encounter: Payer: Self-pay | Admitting: Internal Medicine

## 2017-02-20 ENCOUNTER — Other Ambulatory Visit (INDEPENDENT_AMBULATORY_CARE_PROVIDER_SITE_OTHER): Payer: Medicare Other

## 2017-02-20 VITALS — BP 100/62 | HR 70 | Temp 98.1°F | Resp 16 | Wt 234.0 lb

## 2017-02-20 DIAGNOSIS — D508 Other iron deficiency anemias: Secondary | ICD-10-CM

## 2017-02-20 DIAGNOSIS — I255 Ischemic cardiomyopathy: Secondary | ICD-10-CM

## 2017-02-20 DIAGNOSIS — N19 Unspecified kidney failure: Secondary | ICD-10-CM | POA: Diagnosis not present

## 2017-02-20 DIAGNOSIS — I5042 Chronic combined systolic (congestive) and diastolic (congestive) heart failure: Secondary | ICD-10-CM | POA: Diagnosis not present

## 2017-02-20 DIAGNOSIS — R739 Hyperglycemia, unspecified: Secondary | ICD-10-CM

## 2017-02-20 LAB — CBC WITH DIFFERENTIAL/PLATELET
Basophils Absolute: 0 10*3/uL (ref 0.0–0.1)
Basophils Relative: 0.5 % (ref 0.0–3.0)
Eosinophils Absolute: 0.1 10*3/uL (ref 0.0–0.7)
Eosinophils Relative: 1.6 % (ref 0.0–5.0)
HCT: 40.6 % (ref 39.0–52.0)
Hemoglobin: 13.6 g/dL (ref 13.0–17.0)
Lymphocytes Relative: 11.9 % — ABNORMAL LOW (ref 12.0–46.0)
Lymphs Abs: 0.8 10*3/uL (ref 0.7–4.0)
MCHC: 33.5 g/dL (ref 30.0–36.0)
MCV: 93.8 fl (ref 78.0–100.0)
Monocytes Absolute: 0.5 10*3/uL (ref 0.1–1.0)
Monocytes Relative: 7.4 % (ref 3.0–12.0)
Neutro Abs: 5.5 10*3/uL (ref 1.4–7.7)
Neutrophils Relative %: 78.6 % — ABNORMAL HIGH (ref 43.0–77.0)
Platelets: 237 10*3/uL (ref 150.0–400.0)
RBC: 4.33 Mil/uL (ref 4.22–5.81)
RDW: 13.7 % (ref 11.5–15.5)
WBC: 7 10*3/uL (ref 4.0–10.5)

## 2017-02-20 LAB — URINALYSIS, ROUTINE W REFLEX MICROSCOPIC
Bilirubin Urine: NEGATIVE
Ketones, ur: NEGATIVE
Leukocytes, UA: NEGATIVE
Nitrite: NEGATIVE
Specific Gravity, Urine: 1.02 (ref 1.000–1.030)
Total Protein, Urine: NEGATIVE
Urine Glucose: NEGATIVE
Urobilinogen, UA: 0.2 (ref 0.0–1.0)
pH: 5.5 (ref 5.0–8.0)

## 2017-02-20 LAB — BASIC METABOLIC PANEL
BUN: 20 mg/dL (ref 6–23)
CO2: 28 mEq/L (ref 19–32)
Calcium: 9.3 mg/dL (ref 8.4–10.5)
Chloride: 107 mEq/L (ref 96–112)
Creatinine, Ser: 0.87 mg/dL (ref 0.40–1.50)
GFR: 91.65 mL/min (ref >60.00)
Glucose, Bld: 105 mg/dL — ABNORMAL HIGH (ref 70–99)
Potassium: 3.6 mEq/L (ref 3.5–5.1)
Sodium: 144 mEq/L (ref 135–145)

## 2017-02-20 LAB — HEMOGLOBIN A1C: Hgb A1c MFr Bld: 6.1 % (ref 4.6–6.5)

## 2017-02-20 LAB — MAGNESIUM: Magnesium: 2 mg/dL (ref 1.5–2.5)

## 2017-02-20 MED ORDER — FUROSEMIDE 80 MG PO TABS: 40.0000 mg | ORAL_TABLET | Freq: Two times a day (BID) | ORAL | 1 refills | Status: DC

## 2017-02-20 MED ORDER — SPIRONOLACTONE 25 MG PO TABS
12.5000 mg | ORAL_TABLET | Freq: Every day | ORAL | 1 refills | Status: DC
Start: 2017-02-20 — End: 2017-05-12

## 2017-02-20 NOTE — Progress Notes (Signed)
Pre visit review using our clinic review tool, if applicable. No additional management support is needed unless otherwise documented below in the visit note. 

## 2017-02-20 NOTE — Progress Notes (Signed)
Remote ICD transmission.   

## 2017-02-20 NOTE — Patient Instructions (Signed)
Heart Failure °Heart failure is a condition in which the heart has trouble pumping blood because it has become weak or stiff. This means that the heart does not pump blood efficiently for the body to work well. For some people with heart failure, fluid may back up into the lungs and there may be swelling (edema) in the lower legs. Heart failure is usually a long-term (chronic) condition. It is important for you to take good care of yourself and follow the treatment plan from your health care provider. °What are the causes? °This condition is caused by some health problems, including: °· High blood pressure (hypertension). Hypertension causes the heart muscle to work harder than normal. High blood pressure eventually causes the heart to become stiff and weak. °· Coronary artery disease (CAD). CAD is the buildup of cholesterol and fat (plaques) in the arteries of the heart. °· Heart attack (myocardial infarction). Injured tissue, which is caused by the heart attack, does not contract as well and the heart's ability to pump blood is weakened. °· Abnormal heart valves. When the heart valves do not open and close properly, the heart muscle must pump harder to keep the blood flowing. °· Heart muscle disease (cardiomyopathy or myocarditis). Heart muscle disease is damage to the heart muscle from a variety of causes, such as drug or alcohol abuse, infections, or unknown causes. These can increase the risk of heart failure. °· Lung disease. When the lungs do not work properly, the heart must work harder. ° °What increases the risk? °Risk of heart failure increases as a person ages. This condition is also more likely to develop in people who: °· Are overweight. °· Are male. °· Smoke or chew tobacco. °· Abuse alcohol or illegal drugs. °· Have taken medicines that can damage the heart, such as chemotherapy drugs. °· Have diabetes. °? High blood sugar (glucose) is associated with high fat (lipid) levels in the blood. °? Diabetes  can also damage tiny blood vessels that carry nutrients to the heart muscle. °· Have abnormal heart rhythms. °· Have thyroid problems. °· Have low blood counts (anemia). ° °What are the signs or symptoms? °Symptoms of this condition include: °· Shortness of breath with activity, such as when climbing stairs. °· Persistent cough. °· Swelling of the feet, ankles, legs, or abdomen. °· Unexplained weight gain. °· Difficulty breathing when lying flat (orthopnea). °· Waking from sleep because of the need to sit up and get more air. °· Rapid heartbeat. °· Fatigue and loss of energy. °· Feeling light-headed, dizzy, or close to fainting. °· Loss of appetite. °· Nausea. °· Increased urination during the night (nocturia). °· Confusion. ° °How is this diagnosed? °This condition is diagnosed based on: °· Medical history, symptoms, and a physical exam. °· Diagnostic tests, which may include: °? Echocardiogram. °? Electrocardiogram (ECG). °? Chest X-ray. °? Blood tests. °? Exercise stress test. °? Radionuclide scans. °? Cardiac catheterization and angiogram. ° °How is this treated? °Treatment for this condition is aimed at managing the symptoms of heart failure. Medicines, behavioral changes, or other treatments may be necessary to treat heart failure. °Medicines °These may include: °· Angiotensin-converting enzyme (ACE) inhibitors. This type of medicine blocks the effects of a blood protein called angiotensin-converting enzyme. ACE inhibitors relax (dilate) the blood vessels and help to lower blood pressure. °· Angiotensin receptor blockers (ARBs). This type of medicine blocks the actions of a blood protein called angiotensin. ARBs dilate the blood vessels and help to lower blood pressure. °· Water   pills (diuretics). Diuretics cause the kidneys to remove salt and water from the blood. The extra fluid is removed through urination, leaving a lower volume of blood that the heart has to pump. °· Beta blockers. These improve heart  muscle strength and they prevent the heart from beating too quickly. °· Digoxin. This increases the force of the heartbeat. ° °Healthy behavior changes °These may include: °· Reaching and maintaining a healthy weight. °· Stopping smoking or chewing tobacco. °· Eating heart-healthy foods. °· Limiting or avoiding alcohol. °· Stopping use of street drugs (illegal drugs). °· Physical activity. ° °Other treatments °These may include: °· Surgery to open blocked coronary arteries or repair damaged heart valves. °· Placement of a biventricular pacemaker to improve heart muscle function (cardiac resynchronization therapy). This device paces both the right ventricle and left ventricle. °· Placement of a device to treat serious abnormal heart rhythms (implantable cardioverter defibrillator, or ICD). °· Placement of a device to improve the pumping ability of the heart (left ventricular assist device, or LVAD). °· Heart transplant. This can cure heart failure, and it is considered for certain patients who do not improve with other therapies. ° °Follow these instructions at home: °Medicines °· Take over-the-counter and prescription medicines only as told by your health care provider. Medicines are important in reducing the workload of your heart, slowing the progression of heart failure, and improving your symptoms. °? Do not stop taking your medicine unless your health care provider told you to do that. °? Do not skip any dose of medicine. °? Refill your prescriptions before you run out of medicine. You need your medicines every day. °Eating and drinking ° °· Eat heart-healthy foods. Talk with a dietitian to make an eating plan that is right for you. °? Choose foods that contain no trans fat and are low in saturated fat and cholesterol. Healthy choices include fresh or frozen fruits and vegetables, fish, lean meats, legumes, fat-free or low-fat dairy products, and whole-grain or high-fiber foods. °? Limit salt (sodium) if  directed by your health care provider. Sodium restriction may reduce symptoms of heart failure. Ask a dietitian to recommend heart-healthy seasonings. °? Use healthy cooking methods instead of frying. Healthy methods include roasting, grilling, broiling, baking, poaching, steaming, and stir-frying. °· Limit your fluid intake if directed by your health care provider. Fluid restriction may reduce symptoms of heart failure. °Lifestyle °· Stop smoking or using chewing tobacco. Nicotine and tobacco can damage your heart and your blood vessels. Do not use nicotine gum or patches before talking to your health care provider. °· Limit alcohol intake to no more than 1 drink per day for non-pregnant women and 2 drinks per day for men. One drink equals 12 oz of beer, 5 oz of wine, or 1½ oz of hard liquor. °? Drinking more than that is harmful to your heart. Tell your health care provider if you drink alcohol several times a week. °? Talk with your health care provider about whether any level of alcohol use is safe for you. °? If your heart has already been damaged by alcohol or you have severe heart failure, drinking alcohol should be stopped completely. °· Stop use of illegal drugs. °· Lose weight if directed by your health care provider. Weight loss may reduce symptoms of heart failure. °· Do moderate physical activity if directed by your health care provider. People who are elderly and people with severe heart failure should consult with a health care provider for physical activity recommendations. °  Monitor important information °· Weigh yourself every day. Keeping track of your weight daily helps you to notice excess fluid sooner. °? Weigh yourself every morning after you urinate and before you eat breakfast. °? Wear the same amount of clothing each time you weigh yourself. °? Record your daily weight. Provide your health care provider with your weight record. °· Monitor and record your blood pressure as told by your health  care provider. °· Check your pulse as told by your health care provider. °Dealing with extreme temperatures °· If the weather is extremely hot: °? Avoid vigorous physical activity. °? Use air conditioning or fans or seek a cooler location. °? Avoid caffeine and alcohol. °? Wear loose-fitting, lightweight, and light-colored clothing. °· If the weather is extremely cold: °? Avoid vigorous physical activity. °? Layer your clothes. °? Wear mittens or gloves, a hat, and a scarf when you go outside. °? Avoid alcohol. °General instructions °· Manage other health conditions such as hypertension, diabetes, thyroid disease, or abnormal heart rhythms as told by your health care provider. °· Learn to manage stress. If you need help to do this, ask your health care provider. °· Plan rest periods when fatigued. °· Get ongoing education and support as needed. °· Participate in or seek rehabilitation as needed to maintain or improve independence and quality of life. °· Stay up to date with immunizations. Keeping current on pneumococcal and influenza immunizations is especially important to prevent respiratory infections. °· Keep all follow-up visits as told by your health care provider. This is important. °Contact a health care provider if: °· You have a rapid weight gain. °· You have increasing shortness of breath that is unusual for you. °· You are unable to participate in your usual physical activities. °· You tire easily. °· You cough more than normal, especially with physical activity. °· You have any swelling or more swelling in areas such as your hands, feet, ankles, or abdomen. °· You are unable to sleep because it is hard to breathe. °· You feel like your heart is beating quickly (palpitations). °· You become dizzy or light-headed when you stand up. °Get help right away if: °· You have difficulty breathing. °· You notice or your family notices a change in your awareness, such as having trouble staying awake or having  difficulty with concentration. °· You have pain or discomfort in your chest. °· You have an episode of fainting (syncope). °This information is not intended to replace advice given to you by your health care provider. Make sure you discuss any questions you have with your health care provider. °Document Released: 11/27/2005 Document Revised: 08/01/2016 Document Reviewed: 06/21/2016 °Elsevier Interactive Patient Education © 2017 [employer] ° °

## 2017-02-20 NOTE — Progress Notes (Signed)
Subjective:  Patient ID: [name], male    DOB: 1946/09/26  Age: 71 y.o. MRN: [id number]  CC: Hypertension and Congestive Heart Failure   HPI [name] presents for follow-up after being seen about a week ago for upper respiratory infection. He feels much better and tells me his cough has resolved. He recently had lab work done elsewhere and is concerned about an increase in his BUN and creatinine as well as an elevated white blood cell count. His blood pressure has been low and he has had a few episodes of lightheadedness.  Outpatient Medications Prior to Visit  Medication Sig Dispense Refill  . albuterol (PROVENTIL HFA;VENTOLIN HFA) 108 (90 BASE) MCG/ACT inhaler Inhale 2 puffs into the lungs every 6 (six) hours as needed for wheezing or shortness of breath. 3 Inhaler 4  . albuterol (PROVENTIL) (2.5 MG/3ML) 0.083% nebulizer solution Take 3 mLs (2.5 mg total) by nebulization every 6 (six) hours. 120 mL 3  . amiodarone (PACERONE) 200 MG tablet TAKE 1 TABLET BY MOUTH  DAILY 90 tablet 3  . aspirin 81 MG tablet Take 81 mg by mouth daily.    Marland Kitchen atorvastatin (LIPITOR) 40 MG tablet TAKE 1 TABLET BY MOUTH  DAILY 90 tablet 3  . carvedilol (COREG) 25 MG tablet TAKE 1 TABLET BY MOUTH  TWICE A DAY WITH MEALS 180 tablet 3  . cefdinir (OMNICEF) 300 MG capsule Take 1 capsule (300 mg total) by mouth 2 (two) times daily. 20 capsule 3  . clopidogrel (PLAVIX) 75 MG tablet TAKE 1 TABLET BY MOUTH  DAILY 90 tablet 3  . ENTRESTO 97-103 MG TAKE 1 TABLET BY MOUTH TWICE A DAY 60 tablet 6  . finasteride (PROSCAR) 5 MG tablet Take 5 mg by mouth daily.    . fluticasone (FLONASE) 50 MCG/ACT nasal spray Instill 1 spray in each  nostril daily 32 g 3  . Fluticasone-Umeclidin-Vilant (TRELEGY ELLIPTA) 100-62.5-25 MCG/INH AEPB Inhale 1 puff into the lungs daily. 30 each 5  . ipratropium (ATROVENT HFA) 17 MCG/ACT inhaler Inhale 2 puffs into the lungs every 4 (four) hours as needed for wheezing. 1 Inhaler 12  .  isosorbide mononitrate (IMDUR) 60 MG 24 hr tablet TAKE 1 TABLET BY MOUTH  DAILY 90 tablet 3  . levothyroxine (SYNTHROID, LEVOTHROID) 50 MCG tablet TAKE 1 TABLET BY MOUTH  DAILY BEFORE BREAKFAST 90 tablet 1  . nitroGLYCERIN (NITROSTAT) 0.4 MG SL tablet DISSOLVE ONE TABLET UNDER  THE TONGUE EVERY 5 MINUTES  AS NEEDED FOR CHEST PAIN  (UP TO 3 TABS IN 15 MINUTES THEN CALL 911) 75 tablet 0  . NON FORMULARY at bedtime. CPAP And during the day as needed    . furosemide (LASIX) 80 MG tablet TAKE 1 TABLET BY MOUTH TWO  TIMES DAILY 180 tablet 3  . promethazine-dextromethorphan (PROMETHAZINE-DM) 6.25-15 MG/5ML syrup Take 5 mLs by mouth 4 (four) times daily as needed for cough. 118 mL 0  . spironolactone (ALDACTONE) 25 MG tablet Take 1 tablet by mouth  daily 90 tablet 3   No facility-administered medications prior to visit.     ROS Review of Systems  Constitutional: Negative.  Negative for chills, diaphoresis, fatigue and fever.  HENT: Negative.  Negative for trouble swallowing.   Eyes: Negative for visual disturbance.  Respiratory: Negative for cough, chest tightness, shortness of breath and wheezing.   Cardiovascular: Negative for chest pain, palpitations and leg swelling.  Gastrointestinal: Negative.  Negative for abdominal pain, constipation, diarrhea, nausea and vomiting.  Endocrine: Negative.   Genitourinary: Negative.  Negative for difficulty urinating, dysuria, hematuria and urgency.  Musculoskeletal: Negative.  Negative for back pain, myalgias and neck pain.  Skin: Negative.   Neurological: Positive for dizziness and light-headedness.  Hematological: Negative for adenopathy. Does not bruise/bleed easily.  Psychiatric/Behavioral: Negative.     Objective:  BP 100/62 (BP Location: Left Arm, Patient Position: Sitting)   Pulse 70   Temp 98.1 F (36.7 C) (Oral)   Wt 234 lb (106.1 kg)   SpO2 96%   BMI 35.58 kg/m   BP Readings from Last 3 Encounters:  02/21/17 (!) 92/46  02/20/17 100/62   02/14/17 (!) 86/40    Wt Readings from Last 3 Encounters:  02/21/17 234 lb (106.1 kg)  02/20/17 234 lb (106.1 kg)  02/14/17 247 lb (112 kg)    Physical Exam  Constitutional: He is oriented to person, place, and time. No distress.  HENT:  Mouth/Throat: Oropharynx is clear and moist. No oropharyngeal exudate.  Eyes: Conjunctivae are normal. Right eye exhibits no discharge. Left eye exhibits no discharge. No scleral icterus.  Neck: Normal range of motion. Neck supple. No JVD present. No tracheal deviation present. No thyromegaly present.  Cardiovascular: Normal rate, regular rhythm, normal heart sounds and intact distal pulses.  Exam reveals no gallop and no friction rub.   No murmur heard. Pulmonary/Chest: Effort normal and breath sounds normal. No stridor. No respiratory distress. He has no wheezes. He has no rales. He exhibits no tenderness.  Abdominal: Soft. Bowel sounds are normal. He exhibits no distension and no mass. There is no tenderness. There is no rebound and no guarding.  Musculoskeletal: Normal range of motion. He exhibits deformity. He exhibits no edema or tenderness.  Lymphadenopathy:    He has no cervical adenopathy.  Neurological: He is oriented to person, place, and time.  Skin: Skin is warm and dry. No rash noted. He is not diaphoretic. No erythema. No pallor.  Vitals reviewed.   Lab Results  Component Value Date   WBC 7.0 02/20/2017   HGB 13.6 02/20/2017   HCT 40.6 02/20/2017   PLT 237.0 02/20/2017   GLUCOSE 105 (H) 02/20/2017   CHOL 127 08/18/2016   TRIG 144 08/18/2016   HDL 29 (L) 08/18/2016   LDLCALC 69 08/18/2016   ALT 14 (L) 02/14/2017   AST 15 02/14/2017   NA 144 02/20/2017   K 3.6 02/20/2017   CL 107 02/20/2017   CREATININE 0.87 02/20/2017   BUN 20 02/20/2017   CO2 28 02/20/2017   TSH 3.470 12/21/2016   PSA 0.51 04/18/2012   INR 1.18 02/14/2017   HGBA1C 6.1 02/20/2017    No results found.  Assessment & Plan:   Nickie was seen today  for hypertension and congestive heart failure.  Diagnoses and all orders for this visit:  Chronic combined systolic and diastolic CHF, NYHA class 3 ([place])- he is euvolemic, his renal function has rebounded and is normal, his blood pressure is over controlled so I have asked him to decrease the dose of his diuretics but continue Entresto at the current dose. -     furosemide (LASIX) 80 MG tablet; Take 0.5 tablets (40 mg total) by mouth 2 (two) times daily. -     spironolactone (ALDACTONE) 25 MG tablet; Take 0.5 tablets (12.5 mg total) by mouth daily.  Hyperglycemia- his hemoglobin A1c is at 6.1%, he is prediabetic, no medications are needed to treat this. -     Hemoglobin A1c; Future  Prerenal  renal failure- his BUN and creatinine have normalized but his blood pressure is over controlled so I've asked him to decrease the dose of both of his diuretics by 50%. -     Basic metabolic panel; Future -     Urinalysis, Routine w reflex microscopic; Future -     Magnesium; Future  Iron deficiency anemia secondary to inadequate dietary iron intake- improvement noted, will continue iron replacement therapy. -     CBC with Differential/Platelet; Future   I have discontinued Mr. Mariotti's promethazine-dextromethorphan. I have also changed his furosemide and spironolactone. Additionally, I am having him maintain his aspirin, finasteride, NON FORMULARY, albuterol, ipratropium, fluticasone, levothyroxine, amiodarone, atorvastatin, carvedilol, clopidogrel, nitroGLYCERIN, albuterol, isosorbide mononitrate, ENTRESTO, cefdinir, and Fluticasone-Umeclidin-Vilant.  Meds ordered this encounter  Medications  . furosemide (LASIX) 80 MG tablet    Sig: Take 0.5 tablets (40 mg total) by mouth 2 (two) times daily.    Dispense:  180 tablet    Refill:  1  . spironolactone (ALDACTONE) 25 MG tablet    Sig: Take 0.5 tablets (12.5 mg total) by mouth daily.    Dispense:  45 tablet    Refill:  1     Follow-up: Return in  about 6 weeks (around 04/03/2017).  [name], MD

## 2017-02-21 ENCOUNTER — Encounter (HOSPITAL_COMMUNITY)
Admission: RE | Disposition: A | Discharge status: Home / Self Care | Payer: Self-pay | Source: Ambulatory Visit | Attending: Thoracic Surgery (Cardiothoracic Vascular Surgery)

## 2017-02-21 ENCOUNTER — Ambulatory Visit (HOSPITAL_COMMUNITY)
Admission: RE | Admit: 2017-02-21 | Discharge: 2017-02-21 | Disposition: A | Discharge status: Home / Self Care | Payer: Medicare Other | Source: Ambulatory Visit | Attending: Thoracic Surgery (Cardiothoracic Vascular Surgery) | Admitting: Thoracic Surgery (Cardiothoracic Vascular Surgery)

## 2017-02-21 ENCOUNTER — Encounter (HOSPITAL_COMMUNITY): Payer: Self-pay | Admitting: *Deleted

## 2017-02-21 ENCOUNTER — Encounter: Payer: Self-pay | Admitting: Cardiology

## 2017-02-21 ENCOUNTER — Ambulatory Visit (HOSPITAL_COMMUNITY): Payer: Medicare Other

## 2017-02-21 DIAGNOSIS — Z538 Procedure and treatment not carried out for other reasons: Secondary | ICD-10-CM | POA: Diagnosis not present

## 2017-02-21 DIAGNOSIS — R911 Solitary pulmonary nodule: Secondary | ICD-10-CM

## 2017-02-21 SURGERY — VIDEO BRONCHOSCOPY WITH ENDOBRONCHIAL NAVIGATION
Anesthesia: General

## 2017-02-21 MED ORDER — DEXAMETHASONE SODIUM PHOSPHATE 10 MG/ML IJ SOLN
INTRAMUSCULAR | Status: AC
  Filled 2017-02-21: qty 1

## 2017-02-21 MED ORDER — LACTATED RINGERS IV SOLN: INTRAVENOUS | Status: DC

## 2017-02-21 MED ORDER — ONDANSETRON HCL 4 MG/2ML IJ SOLN
INTRAMUSCULAR | Status: AC
  Filled 2017-02-21: qty 2

## 2017-02-21 MED ORDER — SUGAMMADEX SODIUM 200 MG/2ML IV SOLN
INTRAVENOUS | Status: AC
  Filled 2017-02-21: qty 2

## 2017-02-21 MED ORDER — PROPOFOL 10 MG/ML IV BOLUS
INTRAVENOUS | Status: AC
  Filled 2017-02-21: qty 20

## 2017-02-21 MED ORDER — MIDAZOLAM HCL 2 MG/2ML IJ SOLN
INTRAMUSCULAR | Status: AC
  Filled 2017-02-21: qty 2

## 2017-02-21 MED ORDER — FENTANYL CITRATE (PF) 100 MCG/2ML IJ SOLN
INTRAMUSCULAR | Status: AC
  Filled 2017-02-21: qty 2

## 2017-02-21 NOTE — Anesthesia Preprocedure Evaluation (Addendum)
Anesthesia Evaluation  Patient identified by MRN, date of birth, ID band Patient awake    Reviewed: Allergy & Precautions, NPO status , Patient's Chart, lab work & pertinent test results  Airway Mallampati: III  TM Distance: <3 FB Neck ROM: Limited    Dental no notable dental hx.    Pulmonary shortness of breath, sleep apnea , COPD, former smoker,    Pulmonary exam normal breath sounds clear to auscultation       Cardiovascular hypertension, + CAD (- Left ventricle: Diffuse hypokinesis and inferior wall akinesis.), + Past MI, + CABG, + Peripheral Vascular Disease (RCA occluded, left CEA) and +CHF  Normal cardiovascular exam+ dysrhythmias + Cardiac Defibrillator (checked 11/2016)  Rhythm:Regular Rate:Normal  Left ventricle: Diffuse hypokinesis and inferior wall akinesis.   The cavity size was severely dilated. Systolic function was   severely reduced. The estimated ejection fraction was in the   range of 20% to 25%. Diffuse hypokinesis. Doppler parameters are   consistent with abnormal left ventricular relaxation (grade 1   diastolic dysfunction). Doppler parameters are consistent with   elevated ventricular end-diastolic filling pressure. - Aortic valve: Trileaflet; moderately thickened, moderately   calcified leaflets. Sclerosis without stenosis. There was no   regurgitation. - Aortic root: The aortic root was normal in size. - Mitral valve: Calcified annulus. Mildly thickened leaflets .   There was mild regurgitation. - Left atrium: The atrium was moderately dilated. - Right ventricle: The cavity size was normal. Wall thickness was   normal. Systolic function was mildly reduced. - Right atrium: Pacer wire or catheter noted in right atrium. - Tricuspid valve: There was mild regurgitation. - Pericardium, extracardiac: There was no pericardial effusion.    Neuro/Psych negative neurological ROS  negative psych ROS   GI/Hepatic Neg liver ROS, hiatal hernia, GERD  ,  Endo/Other  diabetesHypothyroidism   Renal/GU Renal disease  negative genitourinary   Musculoskeletal negative musculoskeletal ROS (+)   Abdominal   Peds negative pediatric ROS (+)  Hematology negative hematology ROS (+) anemia ,   Anesthesia Other Findings   Reproductive/Obstetrics negative OB ROS                          Anesthesia Physical Anesthesia Plan  ASA: IV  Anesthesia Plan: General   Post-op Pain Management:    Induction: Intravenous  Airway Management Planned: Oral ETT and Video Laryngoscope Planned  Additional Equipment:   Intra-op Plan:   Post-operative Plan: Extubation in OR  Informed Consent: I have reviewed the patients History and Physical, chart, labs and discussed the procedure including the risks, benefits and alternatives for the proposed anesthesia with the patient or authorized representative who has indicated his/her understanding and acceptance.   Dental advisory given  Plan Discussed with: CRNA and Surgeon  Anesthesia Plan Comments: (8.5 ET Tube intubation Etomidate for  Induction Magnet in room )     Anesthesia Quick Evaluation

## 2017-02-21 NOTE — Progress Notes (Signed)
Anesthesiologist notified of ICD. Stated would need a magnet on standby in the OR.

## 2017-02-21 NOTE — Progress Notes (Addendum)
patient cancelled due to Plavix.  Per Dr. Roxan Hockey.  OR notified  Thurmond Butts called and notified of the surgery being cancelled.  Thurmond Butts will speak with Dr. Roxan Hockey about when the patient can be rescheduled.  Possibly the 19th or 22nd

## 2017-02-22 ENCOUNTER — Other Ambulatory Visit: Payer: Self-pay | Admitting: *Deleted

## 2017-02-22 ENCOUNTER — Encounter (HOSPITAL_COMMUNITY): Payer: Self-pay | Admitting: Anesthesiology

## 2017-02-22 ENCOUNTER — Encounter: Payer: Self-pay | Admitting: Internal Medicine

## 2017-02-22 DIAGNOSIS — R911 Solitary pulmonary nodule: Secondary | ICD-10-CM

## 2017-02-23 ENCOUNTER — Encounter (HOSPITAL_COMMUNITY)
Admission: RE | Disposition: A | Discharge status: Home / Self Care | Payer: Self-pay | Source: Ambulatory Visit | Attending: Thoracic Surgery (Cardiothoracic Vascular Surgery)

## 2017-02-23 ENCOUNTER — Encounter (HOSPITAL_COMMUNITY): Payer: Self-pay | Admitting: Certified Registered Nurse Anesthetist

## 2017-02-23 ENCOUNTER — Ambulatory Visit (HOSPITAL_COMMUNITY)
Admission: RE | Admit: 2017-02-23 | Discharge: 2017-02-23 | Disposition: A | Discharge status: Home / Self Care | Payer: Medicare Other | Source: Ambulatory Visit | Attending: Thoracic Surgery (Cardiothoracic Vascular Surgery) | Admitting: Thoracic Surgery (Cardiothoracic Vascular Surgery)

## 2017-02-23 ENCOUNTER — Ambulatory Visit (HOSPITAL_COMMUNITY): Payer: Medicare Other | Admitting: Emergency Medicine

## 2017-02-23 ENCOUNTER — Ambulatory Visit (HOSPITAL_COMMUNITY): Payer: Medicare Other | Admitting: Anesthesiology

## 2017-02-23 ENCOUNTER — Ambulatory Visit (HOSPITAL_COMMUNITY): Payer: Medicare Other

## 2017-02-23 DIAGNOSIS — I252 Old myocardial infarction: Secondary | ICD-10-CM | POA: Diagnosis not present

## 2017-02-23 DIAGNOSIS — E785 Hyperlipidemia, unspecified: Secondary | ICD-10-CM | POA: Diagnosis not present

## 2017-02-23 DIAGNOSIS — G4733 Obstructive sleep apnea (adult) (pediatric): Secondary | ICD-10-CM | POA: Insufficient documentation

## 2017-02-23 DIAGNOSIS — Z87891 Personal history of nicotine dependence: Secondary | ICD-10-CM | POA: Insufficient documentation

## 2017-02-23 DIAGNOSIS — Z951 Presence of aortocoronary bypass graft: Secondary | ICD-10-CM | POA: Diagnosis not present

## 2017-02-23 DIAGNOSIS — Z79899 Other long term (current) drug therapy: Secondary | ICD-10-CM | POA: Diagnosis not present

## 2017-02-23 DIAGNOSIS — I739 Peripheral vascular disease, unspecified: Secondary | ICD-10-CM | POA: Insufficient documentation

## 2017-02-23 DIAGNOSIS — I255 Ischemic cardiomyopathy: Secondary | ICD-10-CM | POA: Diagnosis not present

## 2017-02-23 DIAGNOSIS — G4730 Sleep apnea, unspecified: Secondary | ICD-10-CM | POA: Diagnosis not present

## 2017-02-23 DIAGNOSIS — Z6835 Body mass index (BMI) 35.0-35.9, adult: Secondary | ICD-10-CM | POA: Insufficient documentation

## 2017-02-23 DIAGNOSIS — Z95810 Presence of automatic (implantable) cardiac defibrillator: Secondary | ICD-10-CM | POA: Diagnosis not present

## 2017-02-23 DIAGNOSIS — Z7902 Long term (current) use of antithrombotics/antiplatelets: Secondary | ICD-10-CM | POA: Diagnosis not present

## 2017-02-23 DIAGNOSIS — E039 Hypothyroidism, unspecified: Secondary | ICD-10-CM | POA: Diagnosis not present

## 2017-02-23 DIAGNOSIS — I509 Heart failure, unspecified: Secondary | ICD-10-CM | POA: Insufficient documentation

## 2017-02-23 DIAGNOSIS — K219 Gastro-esophageal reflux disease without esophagitis: Secondary | ICD-10-CM | POA: Diagnosis not present

## 2017-02-23 DIAGNOSIS — Z955 Presence of coronary angioplasty implant and graft: Secondary | ICD-10-CM | POA: Diagnosis not present

## 2017-02-23 DIAGNOSIS — N400 Enlarged prostate without lower urinary tract symptoms: Secondary | ICD-10-CM | POA: Diagnosis not present

## 2017-02-23 DIAGNOSIS — J449 Chronic obstructive pulmonary disease, unspecified: Secondary | ICD-10-CM | POA: Insufficient documentation

## 2017-02-23 DIAGNOSIS — I2510 Atherosclerotic heart disease of native coronary artery without angina pectoris: Secondary | ICD-10-CM | POA: Diagnosis not present

## 2017-02-23 DIAGNOSIS — Z7982 Long term (current) use of aspirin: Secondary | ICD-10-CM | POA: Insufficient documentation

## 2017-02-23 DIAGNOSIS — Z7951 Long term (current) use of inhaled steroids: Secondary | ICD-10-CM | POA: Diagnosis not present

## 2017-02-23 DIAGNOSIS — I110 Hypertensive heart disease with heart failure: Secondary | ICD-10-CM | POA: Diagnosis not present

## 2017-02-23 DIAGNOSIS — Z419 Encounter for procedure for purposes other than remedying health state, unspecified: Secondary | ICD-10-CM

## 2017-02-23 DIAGNOSIS — R911 Solitary pulmonary nodule: Secondary | ICD-10-CM | POA: Insufficient documentation

## 2017-02-23 DIAGNOSIS — D509 Iron deficiency anemia, unspecified: Secondary | ICD-10-CM | POA: Diagnosis not present

## 2017-02-23 HISTORY — PX: FUDUCIAL PLACEMENT: SHX5083

## 2017-02-23 HISTORY — PX: VIDEO BRONCHOSCOPY WITH ENDOBRONCHIAL NAVIGATION: SHX6175

## 2017-02-23 LAB — CUP PACEART REMOTE DEVICE CHECK
Battery Remaining Longevity: 90 mo
Battery Remaining Percentage: 100 %
Brady Statistic RA Percent Paced: 0 %
Brady Statistic RV Percent Paced: 92 %
Date Time Interrogation Session: 20180313065500
HighPow Impedance: 40 Ohm
Implantable Lead Implant Date: 20060411
Implantable Lead Implant Date: 20151030
Implantable Lead Implant Date: 20151030
Implantable Lead Location: 753858
Implantable Lead Location: 753859
Implantable Lead Location: 753860
Implantable Lead Model: 148
Implantable Lead Model: 4136
Implantable Lead Serial Number: 145070
Implantable Lead Serial Number: 29713415
Implantable Pulse Generator Implant Date: 20151030
Lead Channel Impedance Value: 518 Ohm
Lead Channel Impedance Value: 559 Ohm
Lead Channel Impedance Value: 691 Ohm
Lead Channel Pacing Threshold Amplitude: 0.7 V
Lead Channel Pacing Threshold Amplitude: 0.7 V
Lead Channel Pacing Threshold Amplitude: 1.9 V
Lead Channel Pacing Threshold Pulse Width: 0.4 ms
Lead Channel Pacing Threshold Pulse Width: 0.4 ms
Lead Channel Pacing Threshold Pulse Width: 0.9 ms
Lead Channel Setting Pacing Amplitude: 2 V
Lead Channel Setting Pacing Amplitude: 2 V
Lead Channel Setting Pacing Amplitude: 3.5 V
Lead Channel Setting Pacing Pulse Width: 0.4 ms
Lead Channel Setting Pacing Pulse Width: 0.9 ms
Lead Channel Setting Sensing Sensitivity: 0.6 mV
Lead Channel Setting Sensing Sensitivity: 1 mV
Pulse Gen Serial Number: 370874

## 2017-02-23 LAB — GLUCOSE, CAPILLARY
Glucose-Capillary: 84 mg/dL (ref 65–99)
Glucose-Capillary: 101 mg/dL — ABNORMAL HIGH (ref 65–99)

## 2017-02-23 SURGERY — VIDEO BRONCHOSCOPY WITH ENDOBRONCHIAL NAVIGATION
Anesthesia: General

## 2017-02-23 MED ORDER — ONDANSETRON HCL 4 MG/2ML IJ SOLN
INTRAMUSCULAR | Status: DC | PRN
  Administered 2017-02-23: 4 mg via INTRAVENOUS

## 2017-02-23 MED ORDER — EPINEPHRINE PF 1 MG/ML IJ SOLN
INTRAMUSCULAR | Status: AC
  Filled 2017-02-23: qty 1

## 2017-02-23 MED ORDER — FENTANYL CITRATE (PF) 100 MCG/2ML IJ SOLN
INTRAMUSCULAR | Status: AC
  Filled 2017-02-23: qty 2

## 2017-02-23 MED ORDER — FENTANYL CITRATE (PF) 100 MCG/2ML IJ SOLN: 25.0000 ug | INTRAMUSCULAR | Status: DC | PRN

## 2017-02-23 MED ORDER — FENTANYL CITRATE (PF) 100 MCG/2ML IJ SOLN
INTRAMUSCULAR | Status: DC | PRN
  Administered 2017-02-23: 25 ug via INTRAVENOUS
  Administered 2017-02-23: 50 ug via INTRAVENOUS
  Administered 2017-02-23: 25 ug via INTRAVENOUS

## 2017-02-23 MED ORDER — PHENYLEPHRINE HCL 10 MG/ML IJ SOLN
INTRAMUSCULAR | Status: DC | PRN
  Administered 2017-02-23 (×3): 80 ug via INTRAVENOUS

## 2017-02-23 MED ORDER — LACTATED RINGERS IV SOLN
INTRAVENOUS | Status: DC
  Administered 2017-02-23: 12:00:00 via INTRAVENOUS

## 2017-02-23 MED ORDER — SUCCINYLCHOLINE CHLORIDE 20 MG/ML IJ SOLN
INTRAMUSCULAR | Status: DC | PRN
  Administered 2017-02-23: 140 mg via INTRAVENOUS

## 2017-02-23 MED ORDER — PROMETHAZINE HCL 25 MG/ML IJ SOLN: 6.2500 mg | INTRAMUSCULAR | Status: DC | PRN

## 2017-02-23 MED ORDER — ROCURONIUM BROMIDE 100 MG/10ML IV SOLN
INTRAVENOUS | Status: DC | PRN
  Administered 2017-02-23: 30 mg via INTRAVENOUS

## 2017-02-23 MED ORDER — LACTATED RINGERS IV SOLN
INTRAVENOUS | Status: DC | PRN
  Administered 2017-02-23: 14:00:00 via INTRAVENOUS

## 2017-02-23 MED ORDER — ETOMIDATE 2 MG/ML IV SOLN
INTRAVENOUS | Status: DC | PRN
  Administered 2017-02-23: 20 mg via INTRAVENOUS

## 2017-02-23 MED ORDER — 0.9 % SODIUM CHLORIDE (POUR BTL) OPTIME
TOPICAL | Status: DC | PRN
  Administered 2017-02-23: 1000 mL

## 2017-02-23 MED ORDER — SUGAMMADEX SODIUM 200 MG/2ML IV SOLN
INTRAVENOUS | Status: DC | PRN
Start: 2017-02-23 — End: 2017-02-23
  Administered 2017-02-23: 200 mg via INTRAVENOUS

## 2017-02-23 SURGICAL SUPPLY — 36 items
ADAPTER BRONCH F/PENTAX (ADAPTER) ×2 IMPLANT
BRUSH SUPERTRAX BIOPSY (INSTRUMENTS) IMPLANT
BRUSH SUPERTRAX NDL-TIP CYTO (INSTRUMENTS) ×2 IMPLANT
CANISTER SUCT 3000ML PPV (MISCELLANEOUS) ×2 IMPLANT
CHANNEL WORK EXTEND EDGE 180 (KITS) IMPLANT
CHANNEL WORK EXTEND EDGE 45 (KITS) IMPLANT
CHANNEL WORK EXTEND EDGE 90 (KITS) IMPLANT
CONT SPEC 4OZ CLIKSEAL STRL BL (MISCELLANEOUS) ×4 IMPLANT
COVER BACK TABLE 60X90IN (DRAPES) ×2 IMPLANT
FILTER STRAW FLUID ASPIR (MISCELLANEOUS) IMPLANT
FORCEPS BIOP SUPERTRX PREMAR (INSTRUMENTS) ×2 IMPLANT
GAUZE SPONGE 4X4 12PLY STRL (GAUZE/BANDAGES/DRESSINGS) ×2 IMPLANT
GLOVE SURG SIGNA 7.5 PF LTX (GLOVE) ×2 IMPLANT
GOWN STRL REUS W/ TWL XL LVL3 (GOWN DISPOSABLE) ×1 IMPLANT
GOWN STRL REUS W/TWL XL LVL3 (GOWN DISPOSABLE) ×1
KIT CLEAN ENDO COMPLIANCE (KITS) ×2 IMPLANT
KIT PROCEDURE EDGE 180 (KITS) IMPLANT
KIT PROCEDURE EDGE 45 (KITS) IMPLANT
KIT PROCEDURE EDGE 90 (KITS) IMPLANT
KIT ROOM TURNOVER OR (KITS) ×2 IMPLANT
MARKER FIDUCIAL SL NIT COIL (Implant Marker) ×6 IMPLANT
MARKER SKIN DUAL TIP RULER LAB (MISCELLANEOUS) ×2 IMPLANT
NEEDLE SUPERTRX PREMARK BIOPSY (NEEDLE) IMPLANT
NS IRRIG 1000ML POUR BTL (IV SOLUTION) ×2 IMPLANT
OIL SILICONE PENTAX (PARTS (SERVICE/REPAIRS)) ×2 IMPLANT
PAD ARMBOARD 7.5X6 YLW CONV (MISCELLANEOUS) ×4 IMPLANT
PATCHES PATIENT (LABEL) ×6 IMPLANT
SYR 20CC LL (SYRINGE) ×2 IMPLANT
SYR 20ML ECCENTRIC (SYRINGE) ×2 IMPLANT
SYR 30ML LL (SYRINGE) ×2 IMPLANT
SYR 5ML LL (SYRINGE) ×2 IMPLANT
TOWEL OR 17X24 6PK STRL BLUE (TOWEL DISPOSABLE) ×2 IMPLANT
TRAP SPECIMEN MUCOUS 40CC (MISCELLANEOUS) ×2 IMPLANT
TUBE CONNECTING 20X1/4 (TUBING) ×4 IMPLANT
UNDERPAD 30X30 (UNDERPADS AND DIAPERS) ×2 IMPLANT
WATER STERILE IRR 1000ML POUR (IV SOLUTION) ×2 IMPLANT

## 2017-02-23 NOTE — Op Note (Signed)
NAMEULYSSES, ALPER               ACCOUNT NO.:  [id number]  MEDICAL RECORD NO.:  [id number]  LOCATION:                                 FACILITY:  PHYSICIAN:  Revonda Standard. Roxan Hockey, M.D.DATE OF BIRTH:  [birth year]  DATE OF PROCEDURE:  02/23/2017 DATE OF DISCHARGE:                              OPERATIVE REPORT   PREOPERATIVE DIAGNOSIS:  Right upper lobe lung nodule.  POSTOPERATIVE DIAGNOSIS:  Right upper lobe lung nodule.  PROCEDURE:  Electromagnetic navigational bronchoscopy with transbronchial needle aspirations, brushings, and transbronchial biopsies and fiducial placement.  SURGEON:  Revonda Standard. Roxan Hockey, M.D.  ASSISTANT:  None.  ANESTHESIA:  General.  FINDINGS:  Able to navigate within 8 mm of the center of the nodule. Initial prep showed only mucus and blood, fiducials were placed per protocol.  CLINICAL NOTE:  Mr. Kreiter was brought to the operating room on February 23, 2017.  His CT scan was programmed into the super dimension computer and mapped preoperatively.  He was anesthetized and intubated.  After performing a time-out, flexible fiberoptic bronchoscopy was performed via the endotracheal tube.  There was normal endobronchial anatomy and no endobronchial lesions seen to the level of subsegmental bronchi. There were thick clear secretions.  These were cleared with saline.  The locatable guide for navigation was placed and registration was performed.  The bronchoscope then was advanced to the orifice of the right upper lobe bronchus and the appropriate subsegmental bronchus was cannulated.  The locatable guide then was advanced to 8 mm from the center of the nodule with good alignment.  Needle aspirations and needle brushings were performed.  Periodically during the sampling, the locatable guide was replaced.  All sampling was done with fluoroscopy. The locatable guide was periodically repositioned to provide better alignment with the nodule during the sampling.   Biopsies were taken for both pathology and AFB and fungal cultures.  Approximately 20 biopsies were obtained in all.  The locatable guide was reinserted after every 5 biopsies to ensure it remained in good position.  The initial brushings returned nondiagnostic.  Additional brushings were taken as well.  The area around the tumor then was mapped for fiducial placement and fiducials were placed at the site selected by the computer.  The locatable guide and sheath were removed.  A final inspection was made.  There was no ongoing bleeding.  The bronchoscope was removed.  The patient was extubated in the operating room and taken to the postanesthetic care unit in good condition.     Revonda Standard Roxan Hockey, M.D.     SCH/MEDQ  D:  02/23/2017  T:  02/23/2017  Job:  [id number]

## 2017-02-23 NOTE — Brief Op Note (Signed)
02/23/2017  3:57 PM  PATIENT:  [name]  71 y.o. male  PRE-OPERATIVE DIAGNOSIS:  RUL LUNG NODULE  POST-OPERATIVE DIAGNOSIS:  RUL LUNG NODULE  PROCEDURE:  NAVIGATIONAL BRONCHOSCOPY with transbronchial needle aspirations, brushings and transbronchial biopsies and fiducial placement  SURGEON:  Surgeon(s) and Role:    * [name], MD - Primary  ANESTHESIA:   general  EBL:  Total I/O In: 500 [I.V.:500] Out: 5 [Blood:5]  BLOOD ADMINISTERED:none  DRAINS: none   LOCAL MEDICATIONS USED:  NONE  SPECIMEN:  Source of Specimen:  RUL nodule  DISPOSITION OF SPECIMEN:  PATHOLOGY  PLAN OF CARE: Discharge to home after PACU  PATIENT DISPOSITION:  PACU - hemodynamically stable.   Delay start of Pharmacological VTE agent (>24hrs) due to surgical blood loss or risk of bleeding: not applicable

## 2017-02-23 NOTE — Anesthesia Postprocedure Evaluation (Signed)
Anesthesia Post Note  Patient: LAMONTAE RICARDO  Procedure(s) Performed: Procedure(s) (LRB): VIDEO BRONCHOSCOPY WITH ENDOBRONCHIAL NAVIGATION ([name]) PLACEMENT OF FUDUCIAL ([name])  Patient location during evaluation: PACU Anesthesia Type: General Level of consciousness: awake and alert, patient cooperative and oriented Pain management: pain level controlled Vital Signs Assessment: post-procedure vital signs reviewed and stable Respiratory status: spontaneous breathing, nonlabored ventilation and respiratory function stable Cardiovascular status: blood pressure returned to baseline and stable Postop Assessment: no signs of nausea or vomiting Anesthetic complications: no       Last Vitals:  Vitals:   02/23/17 1617 02/23/17 1630  BP: (!) 126/56 106/72  Pulse: 67 68  Resp: 20 19  Temp:  36.7 C    Last Pain:  Vitals:   02/23/17 1230  TempSrc: Oral                 Micaella Gitto,E. Bayard More

## 2017-02-23 NOTE — Interval H&P Note (Signed)
History and Physical Interval Note:  02/23/2017 1:11 PM  [name]  has presented today for surgery, with the diagnosis of RUL LUNG NODULE  The various methods of treatment have been discussed with the patient and family. After consideration of risks, benefits and other options for treatment, the patient has consented to  Procedure(s): [place] ([name]) PLACEMENT OF FUDUCIAL ([name]) as a surgical intervention .  The patient's history has been reviewed, patient examined, no change in status, stable for surgery.  I have reviewed the patient's chart and labs.  Questions were answered to the patient's satisfaction.     [name]

## 2017-02-23 NOTE — H&P (View-Only) (Signed)
[address]       [place] [address]             [phone]    HPI: Mr. 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 He quit smoking about 15 years ago. His PMH is complicated and includes CHF, CAD, s/ CABG, s/p multiple stents, VT, ICD, PVD, hypertension, hyperlipidemia, severe COPD, morbid obesity, sleep apnea, and an AAA.   I first saw him in August 2016 regarding a groundglass opacity in the right upper lobe. He had had a CT done for follow-up of an aortic endograft. There was an incidentally noted a groundglass nodule centrally in the right upper lobe. There was no hilar or mediastinal adenopathy. We have been following him with CT scans since that time. I last saw him in November 2017. The nodule had gradually increased in size. It was stable in November. I have been trying to talk him into having a biopsy, but he was reluctant to do so due to his other medical problems. Therefore, we have continued to follow him radiographically.  In the interim since his last visit he has not had any significant new medical issues. He had been losing weight through dieting, but that has leveled off. His exercise tolerance is stable although he still has significant dyspnea. At times he is even dyspneic at rest. He has COPD and CHF. His CHF seems to be the most limiting factor. He gets SOB with walking <100 feet. He denies cough, hemoptysis, wheezing, orthopnea and PND.   Past Medical History:  Diagnosis Date  . AAA (abdominal aortic aneurysm) ([place])    followed by Dr. Bridgett Larsson  . Adenomatous colon polyp 01/2004  . Anemia    hx  . Automatic implantable cardioverter-defibrillator in situ   . AVM (arteriovenous malformation)   . BPH (benign prostatic hypertrophy)   . CAD (coronary artery disease)    s/p CABGx2 in 1996  . Carotid artery stenosis    LCEA - Dr. Bridgett Larsson in 2013  . CHF (congestive heart failure) ([place])   . Complication of  anesthesia    claustrophobic, unabe to lie on back more than 4 hours at time due to back  . COPD (chronic obstructive pulmonary disease) ([place])   . Diabetes mellitus    DIET CONTROLLED- pt states that this was a misdiagnosis, he was treated while in the hospital  but returned home, loss a massive amount of weight and he has not had a problem with his blood sugar since. States everything has been normal for about 3 years.  . Diverticulosis   . Dysrhythmia    ICD-defibrillator  . Fatigue   . GERD (gastroesophageal reflux disease)   . H/O hiatal hernia   . HLD (hyperlipidemia)   . Hypertension   . Hypothyroidism   . Ischemic cardiomyopathy   . Myocardial infarction   . OSA on CPAP    AHI durign total sleep 14.69/hr, during REM 50.91/hr  . Peripheral vascular disease ([facility])    LCEA, L renal artery stent, bilat iliac stents, R SFA stenosis  . Tremor   . Ventricular tachycardia ([place]) 09/09/2014   Amiodarone was started after appropriate defibrillator shocks for ventricular tachycardia in October 2008   Past Surgical History:  Procedure Laterality Date  . ABDOMINAL AORTIC ENDOVASCULAR STENT GRAFT [name] 01/06/2014   Procedure: ABDOMINAL AORTIC ENDOVASCULAR STENT GRAFT- GORE; ULTRASOUND GUIDED;  Surgeon:  Conrad Huntleigh, MD;  Location: [facility];  Service: Vascular;  Laterality: [name];  . ANGIOPLASTY     BILATERAL  LE  W/STENTS  . BI-VENTRICULAR IMPLANTABLE CARDIOVERTER DEFIBRILLATOR UPGRADE [name] 10/09/2014   Procedure: BI-VENTRICULAR IMPLANTABLE CARDIOVERTER DEFIBRILLATOR UPGRADE;  Surgeon: Evans Lance, MD;  Location: [facility] CATH LAB;  Service: Cardiovascular;  Laterality: [name];  . BIV ICD GENERTAOR CHANGE OUT  10/09/2014   UPGRADE TO BIV        BY DR Lovena Le  . 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 Adora Fridge)  .  CARDIAC CATHETERIZATION  10/03/2002   SVG sequentially to OM1 & OM2 totally occluded at ostium, SVG to PDA totally occluded within previously placed prox vein graft stent, smal distal AAA, bialt iliac stents with 30% left end-stent restenosis and 50% right end-stent restnosis(Dr. Gerrie Nordmann)  . CARDIAC CATHETERIZATION  06/11/1998   L main with 20% narrowing in distal 1/3; LAD with 1st diagonla having 70% ostial narrowing, 2nd diagonal with 40% narrowing in prox third, LIMA & RIMA widely patent; in-stent restenosis of RCIA with successful PTA and new prox SVTRCA stent for residual disease (Dr. Marella Chimes)  . CARDIAC DEFIBRILLATOR PLACEMENT  06/2005   [employer] Vitality HE - ischemic cardiomyopathy - Dr. Marella Chimes  . CAROTID ENDARTERECTOMY Left 01/04/12  . CORONARY ANGIOPLASTY  01/08/2004   cutting balloon atherectomy & percutaneous intervention of RCIA in-stent restenosis (Dr. Marella Chimes)  . CORONARY ARTERY BYPASS GRAFT  11/04/1985   x2 - PDA and sequential DX-OM (Dr. Redmond Pulling)  . ENDARTERECTOMY  01/04/2012   Procedure: ENDARTERECTOMY CAROTID;left  Surgeon: Hinda Lenis, MD;  Location: [facility];  Service: Vascular;  Laterality: Left;  with patch angioplasty  . ICD GENERATOR CHANGE  05/02/2010   [employer]  . ILIAC ARTERY STENT Bilateral 03/1997   and L SFA PTA  . Iron infusion  June 16, 2012  . LEFT HEART CATHETERIZATION WITH CORONARY ANGIOGRAM [name] 07/06/2014   Procedure: LEFT HEART CATHETERIZATION WITH CORONARY ANGIOGRAM;  Surgeon: Peter M Martinique, MD;  Location: [facility] CATH LAB;  Service: Cardiovascular;  Laterality: [name];  . NM MYOCAR PERF WALL MOTION  09/2012   lexiscan myoview - mod-severe perfusion defect r/t infarct or scar w/mild periinfarct ishcemia in basal inferior, mid inferior, apical inferior, basal inferolateral & mid inferoalteral regions - EF 21% low risk scan  . POLYPECTOMY    . RENAL ARTERY STENT Left 03/24/2004   6x28m Genesis stent (Dr. RMarella Chimes  .  TRANSTHORACIC ECHOCARDIOGRAM  12/2012   EF 30-35; LV mod to severely dilated, mod concentric hypertrophy, severe hypokinesis of inferolateral myocardium, moderate hypokineis of anteroseptal region, grade 1 diastolic dysfunction; mod MR; LA mod-severely dialted; RV mod dialted; RA mildly dilated; PA peak pressure 382mg    Current Outpatient Prescriptions  Medication Sig Dispense Refill  . albuterol (PROVENTIL HFA;VENTOLIN HFA) 108 (90 BASE) MCG/ACT inhaler Inhale 2 puffs into the lungs every 6 (six) hours as needed for wheezing or shortness of breath. 3 Inhaler 4  . albuterol (PROVENTIL) (2.5 MG/3ML) 0.083% nebulizer solution Take 3 mLs (2.5 mg total) by nebulization every 6 (six) hours. 120 mL 3  . amiodarone (PACERONE) 200 MG tablet TAKE 1 TABLET BY MOUTH  DAILY 90 tablet 3  . aspirin 81 MG tablet Take 81 mg by mouth daily.    .Marland Kitchen  atorvastatin (LIPITOR) 40 MG tablet TAKE 1 TABLET BY MOUTH  DAILY 90 tablet 3  . benzonatate (TESSALON) 100 MG capsule Take 1 capsule (100 mg total) by mouth 3 (three) times daily as needed for cough. 20 capsule 0  . carvedilol (COREG) 25 MG tablet TAKE 1 TABLET BY MOUTH  TWICE A DAY WITH MEALS 180 tablet 3  . clopidogrel (PLAVIX) 75 MG tablet TAKE 1 TABLET BY MOUTH  DAILY 90 tablet 3  . ENTRESTO 97-103 MG TAKE 1 TABLET BY MOUTH TWICE A DAY 60 tablet 6  . finasteride (PROSCAR) 5 MG tablet Take 5 mg by mouth daily.    . fluticasone (FLONASE) 50 MCG/ACT nasal spray Instill 1 spray in each  nostril daily 32 g 3  . furosemide (LASIX) 80 MG tablet TAKE 1 TABLET BY MOUTH TWO  TIMES DAILY 180 tablet 3  . Glycopyrrolate-Formoterol (BEVESPI AEROSPHERE) 9-4.8 MCG/ACT AERO Inhale 2 puffs into the lungs 2 (two) times daily. 2 Inhaler 0  . ipratropium (ATROVENT HFA) 17 MCG/ACT inhaler Inhale 2 puffs into the lungs every 4 (four) hours as needed for wheezing. 1 Inhaler 12  . ipratropium (ATROVENT) 0.03 % nasal spray Place 2 sprays into both nostrils 2 (two) times daily. Do not use for  more than 5days. 30 mL 0  . isosorbide mononitrate (IMDUR) 60 MG 24 hr tablet TAKE 1 TABLET BY MOUTH  DAILY 90 tablet 3  . levothyroxine (SYNTHROID, LEVOTHROID) 50 MCG tablet TAKE 1 TABLET BY MOUTH  DAILY BEFORE BREAKFAST 90 tablet 1  . Multiple Vitamins-Minerals (MENS 50+ MULTI VITAMIN/MIN) TABS Take 1 tablet by mouth daily.     . multivitamin-lutein (OCUVITE-LUTEIN) CAPS Take 1 capsule by mouth daily.      . nitroGLYCERIN (NITROSTAT) 0.4 MG SL tablet DISSOLVE ONE TABLET UNDER  THE TONGUE EVERY 5 MINUTES  AS NEEDED FOR CHEST PAIN  (UP TO 3 TABS IN 15 MINUTES THEN CALL 911) 75 tablet 0  . NON FORMULARY at bedtime. CPAP And during the day as needed    . spironolactone (ALDACTONE) 25 MG tablet Take 1 tablet by mouth  daily 90 tablet 3   Current Facility-Administered Medications  Medication Dose Route Frequency Provider Last Rate Last Dose  . ipratropium-albuterol (DUONEB) 0.5-2.5 (3) MG/3ML nebulizer solution 3 mL  3 mL Nebulization Once [name], MD        Physical Exam BP (!) 108/57   Pulse 62   Resp 20   Ht '5\' 8"'$  (1.727 m)   Wt 244 lb (110.7 kg)   SpO2 98% Comment: RA  BMI 37.10 kg/m  Obese 71 year old man in no acute distress In good spirits Alert and oriented 3 with no focal neurologic deficit No cervical or supraclavicular adenopathy Cardiac regular rate and rhythm with a faint systolic murmur throat upper sternal border, no gallops Lungs diminished breath sounds bilaterally, no wheezing Abdomen obese soft nontender  Diagnostic Tests: NUCLEAR MEDICINE PET SKULL BASE TO THIGH  TECHNIQUE: 12.5 mCi F-18 FDG was injected intravenously. Full-ring PET imaging was performed from the skull base to thigh after the radiotracer. CT data was obtained and used for attenuation correction and anatomic localization.  FASTING BLOOD GLUCOSE:  Value: 100 mg/dl  COMPARISON:  Chest CT on 11/07/2016 and AP CT on 02/19/2015  FINDINGS: NECK  No hypermetabolic lymph nodes in  the neck.  CHEST  No hypermetabolic mediastinal or hilar nodes. 12 mm spiculated nodule in the anterior left upper lobe is hypermetabolic, with SUV max of 3.9.  No other suspicious pulmonary nodules identified on CT. Mild emphysema noted.  Pacemaker leads in right heart. Stable mild cardiomegaly. Aortic and coronary artery atherosclerosis. Prior CABG.  ABDOMEN/PELVIS  No abnormal hypermetabolic activity within the liver, pancreas, adrenal glands, or spleen. No hypermetabolic lymph nodes in the abdomen or pelvis.  Prior stent graft repair of abdominal aortic aneurysm. Stable native abdominal aortic aneurysm measuring 5.2 cm. Stable mildly enlarged prostate.  SKELETON  No focal hypermetabolic activity to suggest skeletal metastasis.  IMPRESSION: 12 mm spiculated right upper lobe nodule is hypermetabolic, and highly suspicious for bronchogenic carcinoma.  No evidence of thoracic nodal or distant metastatic disease.  Mild emphysema. Prior stent graft repair of abdominal aortic aneurysm.   Electronically Signed   By: Earle Gell M.D.   On: 01/17/2017 12:23 I personally reviewed the PET CT and agree with the findings noted above  Impression: Mr. Stegman is a 71 year old gentleman with a remote history of tobacco abuse who has a groundglass opacity in the right upper lobe. This is now a mixed lesion. There does appear to be a small solid component. On PET CT this is hypermetabolic with an SUV of 3.7.  I once again informed Mr. 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 The differential diagnosis includes infectious or inflammatory nodules as well. My recommendation was that we proceed with electromagnetic navigational bronchoscopy for biopsy and fiducial placement. He is not a candidate for surgical resection but would be a good candidate for stereotactic radiation. He says that he has decided he would agree to have radiation.  I discussed the general nature  of the procedure with Mr. Gutridge and his wife and daughter. They understand this would be done in the operating room under general anesthesia. It is an endoscopic Procedure that does not require any incisions. Typically do this on an outpatient basis. They understand there is no guarantee will be able to make a definitive diagnosis. We will plan to place fiducial markers at the time of the procedure to assist with stereotactic radiation. I reviewed the indications, risks, benefits, and alternatives. They understand the risk include those associated with general anesthesia such as MI, stroke, death, as well as those associated with procedure such as bleeding, pneumothorax, failure to make a diagnosis. He understands and accepts the risks and wishes to proceed.  Plan: Electromagnetic navigational bronchoscopy for biopsy and fiducial placement on Thursday, March 8  [name], MD [facility] [phone]

## 2017-02-23 NOTE — Discharge Instructions (Addendum)
Do not drive or engage in heavy physical activity for 24 hours  You may resume normal activities tomorrow  You may use acetaminophen (Tylenol) as needed for discomfort  You may cough up small amounts of blood over the next few days. Call if you cough up more than 2 tablespoons of blood  Call [phone] if you develop chest pain, shortness of breath or cough up large amounts of blood  My office will contact you next week with follow up information

## 2017-02-23 NOTE — Anesthesia Procedure Notes (Signed)
Procedure Name: Intubation Date/Time: 02/23/2017 2:22 PM Performed by: Tressia Miners LEFFEW Pre-anesthesia Checklist: Patient identified, Patient being monitored, Timeout performed, Emergency Drugs available and Suction available Patient Re-evaluated:Patient Re-evaluated prior to inductionOxygen Delivery Method: Circle System Utilized Preoxygenation: Pre-oxygenation with 100% oxygen Intubation Type: IV induction Ventilation: Mask ventilation without difficulty Laryngoscope Size: Glidescope and 4 Grade View: Grade I Tube type: Oral Tube size: 8.5 mm Number of attempts: 1 Airway Equipment and Method: Stylet Placement Confirmation: ETT inserted through vocal cords under direct vision,  positive ETCO2 and breath sounds checked- equal and bilateral Secured at: 23 cm Tube secured with: Tape Dental Injury: Teeth and Oropharynx as per pre-operative assessment

## 2017-02-23 NOTE — Transfer of Care (Signed)
Immediate Anesthesia Transfer of Care Note  Patient: [name]  Procedure(s) Performed: Procedure(s): VIDEO BRONCHOSCOPY WITH ENDOBRONCHIAL NAVIGATION ([name]) PLACEMENT OF FUDUCIAL ([name])  Patient Location: PACU  Anesthesia Type:General  Level of Consciousness: awake and alert   Airway & Oxygen Therapy: Patient Spontanous Breathing and Patient connected to nasal cannula oxygen  Post-op Assessment: Report given to RN, Post -op Vital signs reviewed and stable and Patient moving all extremities X 4  Post vital signs: Reviewed and stable  Last Vitals:  Vitals:   02/23/17 1230 02/23/17 1532  BP: (!) 138/35 (!) 157/83  Pulse: 69 73  Resp: 20 20  Temp: 36.8 C 36.6 C    Last Pain:  Vitals:   02/23/17 1230  TempSrc: Oral      Patients Stated Pain Goal: 2 (76/18/48 5927)  Complications: No apparent anesthesia complications

## 2017-02-24 LAB — ACID FAST SMEAR (AFB, MYCOBACTERIA): Acid Fast Smear: NEGATIVE

## 2017-02-26 ENCOUNTER — Encounter: Payer: Self-pay | Admitting: *Deleted

## 2017-02-26 ENCOUNTER — Encounter (HOSPITAL_COMMUNITY): Payer: Self-pay | Admitting: Thoracic Surgery (Cardiothoracic Vascular Surgery)

## 2017-02-26 DIAGNOSIS — R911 Solitary pulmonary nodule: Secondary | ICD-10-CM

## 2017-02-26 NOTE — Progress Notes (Signed)
Oncology Nurse Navigator Documentation  Oncology Nurse Navigator Flowsheets 02/26/2017  Navigator Location [facility]  Referral date to RadOnc/MedOnc 02/23/2017  Navigator Encounter Type Other/I received referral from Dr. Leonarda Salon office.  I updated Dr. Julien Nordmann.  Referral completed to Rad Onc.  I will update Rad Onc schedulers to schedule.   Treatment Phase Pre-Tx/Tx Discussion  Barriers/Navigation Needs Coordination of Care  Interventions Coordination of Care  Coordination of Care Appts  Acuity Level 2  Acuity Level 2 Assistance expediting appointments  Time Spent with Patient 30

## 2017-02-27 ENCOUNTER — Ambulatory Visit (INDEPENDENT_AMBULATORY_CARE_PROVIDER_SITE_OTHER): Payer: Medicare Other | Admitting: Ophthalmology

## 2017-02-27 DIAGNOSIS — H353212 Exudative age-related macular degeneration, right eye, with inactive choroidal neovascularization: Secondary | ICD-10-CM | POA: Diagnosis not present

## 2017-02-27 DIAGNOSIS — H353121 Nonexudative age-related macular degeneration, left eye, early dry stage: Secondary | ICD-10-CM

## 2017-02-27 DIAGNOSIS — I10 Essential (primary) hypertension: Secondary | ICD-10-CM

## 2017-02-27 DIAGNOSIS — H35033 Hypertensive retinopathy, bilateral: Secondary | ICD-10-CM | POA: Diagnosis not present

## 2017-02-27 DIAGNOSIS — H43813 Vitreous degeneration, bilateral: Secondary | ICD-10-CM | POA: Diagnosis not present

## 2017-02-28 ENCOUNTER — Encounter: Payer: Self-pay | Admitting: Radiation Oncology

## 2017-03-01 ENCOUNTER — Encounter: Payer: Self-pay | Admitting: *Deleted

## 2017-03-01 NOTE — Progress Notes (Signed)
Oncology Nurse Navigator Documentation  Oncology Nurse Navigator Flowsheets 02/26/2017  Navigator Location [facility]  Referral date to RadOnc/MedOnc 02/23/2017  Navigator Encounter Type Other/per cancer conference discussion today.  [name] needs to be seen with Dr. Sondra Come.  Appt with Dr. Sondra Come  is set for 03/19/17.   Treatment Phase Pre-Tx/Tx Discussion  Barriers/Navigation Needs Coordination of Care  Interventions Coordination of Care  Coordination of Care Appts  Acuity Level 2  Acuity Level 2 Assistance expediting appointments  Time Spent with Patient 30

## 2017-03-14 NOTE — Progress Notes (Signed)
Thoracic Location of Tumor / Histology: right upper lobe lung nodule   Patient presented 23 months ago with symptoms of: As reported by patient and spouse, "first noticed at 6 month evaluation of aortic anuersym.  A lung spot was found during the evaluation.  Was referred to Dr. Roxan Hockey and he stated he was not able to do a biopsy because he would have to remove one of his lobes because of its location and his current medical condition." Dr. Blase Mess placed the markers surgically and referred him to [place].      Biopsies revealed:   02/23/17  Diagnosis Lung, biopsy, Right Upper Lobe - BENIGN LUNG TISSUE AND BLOOD. - NO GRANULOMAS OR MALIGNANCY.  Diagnosis TRANSBRONCHIAL SPECIMEN A, NAVIGATION, RIGHT UPPER LOBE ASPIRATION (SPECIMEN 1 OF 4 COLLECTED 02/23/2017) ABUNDANT BLOOD AND SCANT CELLULARITY. NO MALIGNANT CELLS IDENTIFIED.  Diagnosis TRANSBRONCHIAL SPECIMEN B, NAVIGATION RIGHT UPPER LOBE BRUSHINGS ( SPECIMEN 2 OF 4 COLLECTED 02/23/2017) ABUNDANT BLOOD AND SCANT CELLULARITY. NO MALIGNANT CELLS IDENTIFIED.  Diagnosis BRONCHIAL BRUSHING, NAVIGATION, RUL, BRUSHINGS #2, C (SPECIMEN 3 OF 4, COLLECTED 02/23/17): SCANT CELLULARITY. NO MALIGNANT CELLS IDENTIFIED.  Diagnosis BRONCHIAL BRUSHING, NAVIGATION, RUL, BRUSHING, D (SPECIMEN 4 OF , COLLECTED 02/23/17): SCANT CELLULARITY. NO MALIGNANT CELLS IDENTIFIED.  Tobacco/Marijuana/Snuff/ETOH use: former smoker - quit in 2004 - smoked 1.5 ppd for 40 years, denies marijuana, snuff or ETOH use  Past/Anticipated interventions by cardiothoracic surgery, if any:   02/23/17 - Procedure: VIDEO BRONCHOSCOPY WITH ENDOBRONCHIAL NAVIGATION;  Surgeon: [name], MD  Past/Anticipated interventions by medical oncology, if any: Biopsy & marker placement  Signs/Symptoms  Weight changes, if any: increase in weight due to diagnosis  Respiratory complaints, if any: COPD, OSA, Bronchitis  Hemoptysis, if any: no  Pain issues, if any:   no  SAFETY ISSUES:  Prior radiation? No  Pacemaker/ICD? yes   Possible current pregnancy?no  Is the patient on methotrexate? no  Current Complaints / other details:  Increasing fatigue, shortness of breath with exertion.   Vitals:   03/19/17 1532  BP: 123/76  Pulse: 68  Resp: 20  Temp: 98.2 F (36.8 C)  TempSrc: Oral  SpO2: 97%  Weight: 249 lb 12.8 oz (113.3 kg)    Wt Readings from Last 3 Encounters:  03/19/17 249 lb 12.8 oz (113.3 kg)  02/21/17 234 lb (106.1 kg)  02/20/17 234 lb (106.1 kg)

## 2017-03-19 ENCOUNTER — Ambulatory Visit
Admission: RE | Admit: 2017-03-19 | Discharge: 2017-03-19 | Disposition: A | Discharge status: Home / Self Care | Payer: Medicare Other | Source: Ambulatory Visit | Attending: Radiation Oncology | Admitting: Radiation Oncology

## 2017-03-19 ENCOUNTER — Encounter: Payer: Self-pay | Admitting: Radiation Oncology

## 2017-03-19 ENCOUNTER — Ambulatory Visit: Payer: Medicare Other | Admitting: Radiation Oncology

## 2017-03-19 VITALS — BP 123/76 | HR 68 | Temp 98.2°F | Resp 20 | Wt 249.8 lb

## 2017-03-19 DIAGNOSIS — Z95810 Presence of automatic (implantable) cardiac defibrillator: Secondary | ICD-10-CM | POA: Diagnosis not present

## 2017-03-19 DIAGNOSIS — Z8249 Family history of ischemic heart disease and other diseases of the circulatory system: Secondary | ICD-10-CM | POA: Insufficient documentation

## 2017-03-19 DIAGNOSIS — R911 Solitary pulmonary nodule: Secondary | ICD-10-CM

## 2017-03-19 DIAGNOSIS — Z955 Presence of coronary angioplasty implant and graft: Secondary | ICD-10-CM | POA: Diagnosis not present

## 2017-03-19 DIAGNOSIS — Z833 Family history of diabetes mellitus: Secondary | ICD-10-CM | POA: Insufficient documentation

## 2017-03-19 DIAGNOSIS — Z7982 Long term (current) use of aspirin: Secondary | ICD-10-CM | POA: Diagnosis not present

## 2017-03-19 DIAGNOSIS — Z950 Presence of cardiac pacemaker: Secondary | ICD-10-CM | POA: Diagnosis not present

## 2017-03-19 DIAGNOSIS — I110 Hypertensive heart disease with heart failure: Secondary | ICD-10-CM | POA: Insufficient documentation

## 2017-03-19 DIAGNOSIS — I509 Heart failure, unspecified: Secondary | ICD-10-CM | POA: Diagnosis not present

## 2017-03-19 DIAGNOSIS — K219 Gastro-esophageal reflux disease without esophagitis: Secondary | ICD-10-CM | POA: Insufficient documentation

## 2017-03-19 DIAGNOSIS — E119 Type 2 diabetes mellitus without complications: Secondary | ICD-10-CM | POA: Diagnosis not present

## 2017-03-19 DIAGNOSIS — Z7902 Long term (current) use of antithrombotics/antiplatelets: Secondary | ICD-10-CM | POA: Insufficient documentation

## 2017-03-19 DIAGNOSIS — Z888 Allergy status to other drugs, medicaments and biological substances status: Secondary | ICD-10-CM | POA: Insufficient documentation

## 2017-03-19 DIAGNOSIS — R251 Tremor, unspecified: Secondary | ICD-10-CM | POA: Diagnosis not present

## 2017-03-19 DIAGNOSIS — E785 Hyperlipidemia, unspecified: Secondary | ICD-10-CM | POA: Diagnosis not present

## 2017-03-19 DIAGNOSIS — Z7951 Long term (current) use of inhaled steroids: Secondary | ICD-10-CM | POA: Insufficient documentation

## 2017-03-19 DIAGNOSIS — I255 Ischemic cardiomyopathy: Secondary | ICD-10-CM | POA: Diagnosis not present

## 2017-03-19 DIAGNOSIS — G4733 Obstructive sleep apnea (adult) (pediatric): Secondary | ICD-10-CM | POA: Insufficient documentation

## 2017-03-19 DIAGNOSIS — N400 Enlarged prostate without lower urinary tract symptoms: Secondary | ICD-10-CM | POA: Diagnosis not present

## 2017-03-19 DIAGNOSIS — Z87891 Personal history of nicotine dependence: Secondary | ICD-10-CM | POA: Diagnosis not present

## 2017-03-19 DIAGNOSIS — E1151 Type 2 diabetes mellitus with diabetic peripheral angiopathy without gangrene: Secondary | ICD-10-CM | POA: Diagnosis not present

## 2017-03-19 DIAGNOSIS — Z801 Family history of malignant neoplasm of trachea, bronchus and lung: Secondary | ICD-10-CM | POA: Insufficient documentation

## 2017-03-19 DIAGNOSIS — I252 Old myocardial infarction: Secondary | ICD-10-CM | POA: Insufficient documentation

## 2017-03-19 DIAGNOSIS — K449 Diaphragmatic hernia without obstruction or gangrene: Secondary | ICD-10-CM | POA: Insufficient documentation

## 2017-03-19 DIAGNOSIS — Z79899 Other long term (current) drug therapy: Secondary | ICD-10-CM | POA: Insufficient documentation

## 2017-03-19 DIAGNOSIS — I2510 Atherosclerotic heart disease of native coronary artery without angina pectoris: Secondary | ICD-10-CM | POA: Diagnosis not present

## 2017-03-19 DIAGNOSIS — Z800 Family history of malignant neoplasm of digestive organs: Secondary | ICD-10-CM | POA: Insufficient documentation

## 2017-03-19 DIAGNOSIS — J449 Chronic obstructive pulmonary disease, unspecified: Secondary | ICD-10-CM | POA: Insufficient documentation

## 2017-03-19 DIAGNOSIS — E039 Hypothyroidism, unspecified: Secondary | ICD-10-CM | POA: Insufficient documentation

## 2017-03-19 DIAGNOSIS — Z809 Family history of malignant neoplasm, unspecified: Secondary | ICD-10-CM | POA: Insufficient documentation

## 2017-03-19 NOTE — Progress Notes (Signed)
Radiation Oncology         [phone] ________________________________  Initial Outpatient Consultation  Name: [name] MRN: [id number]  Date: 03/19/2017  DOB: Apr 24, 1946  CC:[name], MD  [name], *   REFERRING PHYSICIAN: [name], *  DIAGNOSIS: PET positive 1.2 cm RUL  nodule  HISTORY OF PRESENT ILLNESS::[name] is a 71 y.o. male who has been followed by Dr. Roxan Hockey with CT scan for a right upper lung nodule incidentally seen on a CT angiogram in March 2016 to observe an abdominal aortic aneurysm stent previously placed. The patient is a former smoker who quit in 2004. He smoked 1.5 ppd for 40 years.  The nodule has gradually increased in size. Dr. Roxan Hockey tried to talk the patient into have a biopsy, but the patient was reluctant to do so due to other medical issues. PET scan on 01/17/17 revealed a 1.2 cm spiculated nodule in the anterior right upper lobe with SUV max 3.9. There was no evidence of metastatic disease.  The patient underwent navigational bronchoscopy with transbronchial needle aspirations, brushings, transbronchial biopsies, and fiducial placement on 02/23/17. None of the RUL brushings, aspiration, or biopsy revealed malignancy.  The patient's case was discussed in cancer conference on 03/01/17. The patient is a poor candidate for surgical resection. The patient and his wife present today to discuss radiation for the management of his disease.  Of note: The patient has a pacemaker/defibrillator in the left upper chest.  PREVIOUS RADIATION THERAPY: No  PAST MEDICAL HISTORY:  has a past medical history of AAA (abdominal aortic aneurysm) ([place]); Adenomatous colon polyp (01/2004); Anemia; Automatic implantable cardioverter-defibrillator in situ; AVM (arteriovenous malformation); BPH (benign prostatic hypertrophy); CAD (coronary artery disease); Carotid artery stenosis; CHF (congestive heart failure) ([place]); Complication of  anesthesia; COPD (chronic obstructive pulmonary disease) ([place]); Diabetes mellitus; Diverticulosis; Dyspnea; Dysrhythmia; Fatigue; GERD (gastroesophageal reflux disease); H/O hiatal hernia; HLD (hyperlipidemia); Hypertension; Hypothyroidism; Ischemic cardiomyopathy; Myocardial infarction; OSA on CPAP; Peripheral vascular disease ([place]); Tremor; and Ventricular tachycardia ([place]) (09/09/2014).    PAST SURGICAL HISTORY: Past Surgical History:  Procedure Laterality Date  . ABDOMINAL AORTIC ENDOVASCULAR STENT GRAFT [name] 01/06/2014   Procedure: ABDOMINAL AORTIC ENDOVASCULAR STENT GRAFT- GORE; ULTRASOUND GUIDED;  Surgeon: Conrad Otway, MD;  Location: [facility];  Service: Vascular;  Laterality: [name];  . ANGIOPLASTY     BILATERAL  LE  W/STENTS  . BI-VENTRICULAR IMPLANTABLE CARDIOVERTER DEFIBRILLATOR UPGRADE [name] 10/09/2014   Procedure: BI-VENTRICULAR IMPLANTABLE CARDIOVERTER DEFIBRILLATOR UPGRADE;  Surgeon: Evans Lance, MD;  Location: [facility] CATH LAB;  Service: Cardiovascular;  Laterality: [name];  . BIV ICD GENERTAOR CHANGE OUT  10/09/2014   UPGRADE TO BIV        BY DR Lovena Le  . 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 Adora Fridge)  . CARDIAC CATHETERIZATION  10/03/2002   SVG sequentially to OM1 & OM2 totally occluded at ostium, SVG to PDA totally occluded within previously placed prox vein graft stent, smal distal AAA, bialt iliac stents with 30% left end-stent restenosis and 50% right end-stent restnosis(Dr. Gerrie Nordmann)  . 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 Marella Chimes)    . CARDIAC DEFIBRILLATOR PLACEMENT  06/2005   Guidant Vitality HE - ischemic cardiomyopathy - Dr. Marella Chimes  . CAROTID ENDARTERECTOMY Left 01/04/12  . CORONARY ANGIOPLASTY  01/08/2004   cutting balloon atherectomy & percutaneous intervention of RCIA in-stent restenosis (Dr. Marella Chimes)  . CORONARY ARTERY BYPASS GRAFT  11/04/1985   x2 - PDA and sequential DX-OM (Dr. Redmond Pulling)  . ENDARTERECTOMY  01/04/2012   Procedure: ENDARTERECTOMY CAROTID;left  Surgeon: Hinda Lenis, MD;  Location: [place];  Service: Vascular;  Laterality: Left;  with patch angioplasty  . FUDUCIAL PLACEMENT [name] 02/23/2017   Procedure: PLACEMENT OF FUDUCIAL;  Surgeon: [name], MD;  Location: [place];  Service: Thoracic;  Laterality: [name];  . ICD GENERATOR CHANGE  05/02/2010   [employer], [employer]  . ILIAC ARTERY STENT Bilateral 03/1997   and L SFA PTA  . Iron infusion  June 16, 2012  . LEFT HEART CATHETERIZATION WITH CORONARY ANGIOGRAM [name] 07/06/2014   Procedure: LEFT HEART CATHETERIZATION WITH CORONARY ANGIOGRAM;  Surgeon: Peter M Martinique, MD;  Location[facility] CATH LAB;  Service: Cardiovascular;  Laterality: [name];  . NM MYOCAR PERF WALL MOTION  09/2012   lexiscan myoview - mod-severe perfusion defect r/t infarct or scar w/mild periinfarct ishcemia in basal inferior, mid inferior, apical inferior, basal inferolateral & mid inferoalteral regions - EF 21% low risk scan  . POLYPECTOMY    . RENAL ARTERY STENT Left 03/24/2004   6x25m Genesis stent (Dr. RMarella Chimes  . TRANSTHORACIC ECHOCARDIOGRAM  12/2012   EF 30-35; LV mod to severely dilated, mod concentric hypertrophy, severe hypokinesis of inferolateral myocardium, moderate hypokineis of anteroseptal region, grade 1 diastolic dysfunction; mod MR; LA mod-severely dialted; RV mod dialted; RA mildly dilated; PA peak pressure 357mg  . VIDEO BRONCHOSCOPY WITH ENDOBRONCHIAL NAVIGATION [name] 02/23/2017   Procedure: VIDEO BRONCHOSCOPY WITH ENDOBRONCHIAL NAVIGATION;  Surgeon:  StMelrose NakayamaMD;  [facility];  Service: Thoracic;  Laterality: [name];    FAMILY HISTORY: family history includes COPD in his sister; Cancer in his mother and sister; Colon cancer in his sister; Colon polyps in his sister; Diabetes in his daughter, mother, and sister; Heart attack in his father and maternal grandmother; Heart disease in his brother, father, mother, and sister; Hyperlipidemia in his mother and sister; Hypertension in his father, mother, and sister; Lung cancer in his mother.  SOCIAL HISTORY:  reports that he quit smoking about 14 years ago. His smoking use included Cigarettes and Pipe. He has a 60.00 pack-year smoking history. He has never used smokeless tobacco. He reports that he does not drink alcohol or use drugs.  ALLERGIES: Tussionex pennkinetic er [hydrocod polst-cpm polst er] and Ace inhibitors  MEDICATIONS:  Current Outpatient Prescriptions  Medication Sig Dispense Refill  . albuterol (PROVENTIL HFA;VENTOLIN HFA) 108 (90 BASE) MCG/ACT inhaler Inhale 2 puffs into the lungs every 6 (six) hours as needed for wheezing or shortness of breath. 3 Inhaler 4  . albuterol (PROVENTIL) (2.5 MG/3ML) 0.083% nebulizer solution Take 3 mLs (2.5 mg total) by nebulization every 6 (six) hours. 120 mL 3  . amiodarone (PACERONE) 200 MG tablet TAKE 1 TABLET BY MOUTH  DAILY 90 tablet 3  . aspirin 81 MG tablet Take 81 mg by mouth daily.    . Marland Kitchentorvastatin (LIPITOR) 40 MG tablet TAKE 1 TABLET BY MOUTH  DAILY 90 tablet 3  . carvedilol (COREG) 25 MG tablet TAKE 1 TABLET  BY MOUTH  TWICE A DAY WITH MEALS 180 tablet 3  . clopidogrel (PLAVIX) 75 MG tablet TAKE 1 TABLET BY MOUTH  DAILY 90 tablet 3  . ENTRESTO 97-103 MG TAKE 1 TABLET BY MOUTH TWICE A DAY 60 tablet 6  . finasteride (PROSCAR) 5 MG tablet Take 5 mg by mouth daily.    . fluticasone (FLONASE) 50 MCG/ACT nasal spray Instill 1 spray in each  nostril daily 32 g 3  . Fluticasone-Umeclidin-Vilant (TRELEGY ELLIPTA) 100-62.5-25 MCG/INH  AEPB Inhale 1 puff into the lungs daily. 30 each 5  . furosemide (LASIX) 80 MG tablet Take 0.5 tablets (40 mg total) by mouth 2 (two) times daily. 180 tablet 1  . ipratropium (ATROVENT HFA) 17 MCG/ACT inhaler Inhale 2 puffs into the lungs every 4 (four) hours as needed for wheezing. 1 Inhaler 12  . isosorbide mononitrate (IMDUR) 60 MG 24 hr tablet TAKE 1 TABLET BY MOUTH  DAILY 90 tablet 3  . levothyroxine (SYNTHROID, LEVOTHROID) 50 MCG tablet TAKE 1 TABLET BY MOUTH  DAILY BEFORE BREAKFAST 90 tablet 1  . nitroGLYCERIN (NITROSTAT) 0.4 MG SL tablet DISSOLVE ONE TABLET UNDER  THE TONGUE EVERY 5 MINUTES  AS NEEDED FOR CHEST PAIN  (UP TO 3 TABS IN 15 MINUTES THEN CALL 911) 75 tablet 0  . NON FORMULARY at bedtime. CPAP And during the day as needed    . spironolactone (ALDACTONE) 25 MG tablet Take 0.5 tablets (12.5 mg total) by mouth daily. 45 tablet 1   No current facility-administered medications for this encounter.     REVIEW OF SYSTEMS:   On review of systems, the patient reports that he is doing well overall. He denies any chest pain or hemoptysis. He reports an increase in weight since his diagnosis, COPD, obstructive sleep apnea, bronchitis, increasing fatigue, and shortness of breast with exertion. He denies fevers, chills, or night sweats. He denies any bowel or bladder disturbances, and denies abdominal pain, nausea or vomiting. He denies any new musculoskeletal or joint aches or pains, new skin lesions or concerns. Pertinent items noted in HPI and remainder of comprehensive ROS otherwise negative.   PHYSICAL EXAM:  weight is 249 lb 12.8 oz (113.3 kg). His oral temperature is 98.2 F (36.8 C). His blood pressure is 123/76 and his pulse is 68. His respiration is 20 and oxygen saturation is 97%.   General: Alert and oriented, in no acute distress HEENT: Head is normocephalic. Extraocular movements are intact. Oropharynx is clear. Neck: Neck is supple, no palpable cervical or supraclavicular  lymphadenopathy. Heart: Regular in rate and rhythm with no murmurs, rubs, or gallops. Chest: Clear to auscultation bilaterally, with no rhonchi, wheezes, or rales. Long scar running midline due to an open heart surgery in the 1980s. Abdomen: Soft, nontender, nondistended, with no rigidity or guarding. Laparoscopic scars from abdominal aortic aneurysm surgery. Extremities: No cyanosis or edema. Compression stocks. Lymphatics: see Neck Exam Skin: No concerning lesions. Musculoskeletal: symmetric strength and muscle tone throughout. Neurologic: Cranial nerves II through XII are grossly intact. No obvious focalities. Speech is fluent. Coordination is intact. Psychiatric: Judgment and insight are intact. Affect is appropriate.  ECOG = 2  0 - Asymptomatic (Fully active, able to carry on all predisease activities without restriction)  1 - Symptomatic but completely ambulatory (Restricted in physically strenuous activity but ambulatory and able to carry out work of a light or sedentary nature. For example, light housework, office work)  2 - Symptomatic, <50% in bed during the day (Ambulatory and  capable of all self care but unable to carry out any work activities. Up and about more than 50% of waking hours)  3 - Symptomatic, >50% in bed, but not bedbound (Capable of only limited self-care, confined to bed or chair 50% or more of waking hours)  4 - Bedbound (Completely disabled. Cannot carry on any self-care. Totally confined to bed or chair)  5 - Death   Eustace Pen MM, Creech RH, Tormey DC, et al. [phone]). "Toxicity and response criteria of the [facility]". [place] Oncol. 5 (6): 649-55  LABORATORY DATA:  Lab Results  Component Value Date   WBC 7.0 02/20/2017   HGB 13.6 02/20/2017   HCT 40.6 02/20/2017   MCV 93.8 02/20/2017   PLT 237.0 02/20/2017   NEUTROABS 5.5 02/20/2017   Lab Results  Component Value Date   NA 144 02/20/2017   K 3.6 02/20/2017   CL 107  02/20/2017   CO2 28 02/20/2017   GLUCOSE 105 (H) 02/20/2017   CREATININE 0.87 02/20/2017   CALCIUM 9.3 02/20/2017      RADIOGRAPHY: Dg Chest 2 View  Result Date: 02/21/2017 CLINICAL DATA:  Right lung nodule. EXAM: CHEST  2 VIEW COMPARISON:  02/13/2017 FINDINGS: Right lower lobe airspace disease concerning for pneumonia. Subtle right middle lobe pulmonary nodule faintly visualized, is better delineated on recent CT chest dated 02/12/2017. No other focal parenchymal opacity. No pleural effusion or pneumothorax. Stable cardiomediastinal silhouette. Prior CABG. Three lead cardiac pacemaker. No acute osseous abnormality. IMPRESSION: Right lower lobe airspace disease concerning for pneumonia. Followup PA and lateral chest X-ray is recommended in 3-4 weeks following trial of antibiotic therapy to ensure resolution and exclude underlying malignancy. Subtle right middle lobe pulmonary nodule faintly visualized, is better delineated on recent CT chest dated 02/12/2017. Electronically Signed   By: Kathreen Devoid   On: 02/21/2017 11:44   Dg C-arm Bronchoscopy  Result Date: 02/23/2017 C-ARM BRONCHOSCOPY: Fluoroscopy was utilized by the requesting physician.  No radiographic interpretation.      IMPRESSION: PET positive 1.2 cm RUL nodule  PET scan showed a hypermetabolic solitary 1.2 cm RUL hypermetabolic nodule. The patient underwent biopsy, aspiration, and brushings of the nodule. They did not reveal malignancy. Given the growth over time of the nodule, we could assume it is likely stage I lung cancer. However, it is also remotley likely the area could be infection or inflammation. SBRT would provide a greater than 85- 90% local control if it is lung cancer. The patient could also defer treatment and have a surveillance CT scan in 3 months.  We discussed that he may fail in the mediastinum hilum or elsewhere in the body and radiation iss a local only process. We discussed the process of CT simulation and the  use of a pad all to decrease respiratory motion. We discussed the use of 4-dimensional simulation to minimize normal lung tissue treated and the use of respiratory compression. We discussed 3- 5 treatments occurring every other day as an outpatient. We discussed these treatments will last about 10-20 minutes and he would be here at the hospital for about an hour. We discussed that SBRT was unlikely to make his breathing any worse. It is also unlikely to make his breathing symptoms any better. We discussed possible side effects including his shoulder pain due to arm positioning and possible rib fracture withthe pleural-based nodules proximity to the ribs. We discussed damage to other critical normal structures including heart, ribs, lung collapse, chronic cough, and  brachial plexus injury. We discussed that without treatment this could develop into a more aggressive or even metastatic cancer.  PLAN: The patient is interested in proceeding with SBRT to the RUL nodule in lieu of surveillance CT imaging. We will call the patient with his CT simulation schedule. This will be performed in the next few days.   ------------------------------------------------  Blair Promise, PhD, MD  This document serves as a record of services personally performed by Gery Pray, MD. It was created on his behalf by Darcus Austin, a trained medical scribe. The creation of this record is based on the scribe's personal observations and the provider's statements to them. This document has been checked and approved by the attending provider.

## 2017-03-20 NOTE — Addendum Note (Signed)
Encounter addended by: Jacqulyn Liner, RN on: 03/20/2017  9:35 AM<BR>    Actions taken: Actions taken from a BestPractice Advisory, Visit Navigator Flowsheet section accepted, Diagnosis association updated

## 2017-03-23 ENCOUNTER — Encounter: Payer: Self-pay | Admitting: *Deleted

## 2017-03-23 NOTE — Progress Notes (Signed)
[facility] Psychosocial Distress Screening Clinical Social Work  Clinical Social Work was referred by distress screening protocol.  The patient scored a 7 on the Psychosocial Distress Thermometer which indicates moderate distress. Clinical Social Worker attempted to contact patient by phone to assess for distress and other psychosocial needs. CSW left voicemail for patient to return call.  ONCBCN DISTRESS SCREENING 03/20/2017  Screening Type Initial Screening  Distress experienced in past week (1-10) 7  Emotional problem type Depression;Nervousness/Anxiety;Adjusting to illness  Information Concerns Type Lack of info about diagnosis;Lack of info about treatment  Physical Problem type Sleep/insomnia;Breathing    Kearston Putman Alver Sorrow, MSW, LCSW, OSW-C Clinical Social Worker [facility] [phone]

## 2017-03-26 LAB — FUNGUS CULTURE WITH STAIN

## 2017-03-26 LAB — FUNGAL ORGANISM REFLEX

## 2017-03-26 LAB — FUNGUS CULTURE RESULT

## 2017-03-27 ENCOUNTER — Ambulatory Visit
Admission: RE | Admit: 2017-03-27 | Discharge: 2017-03-27 | Disposition: A | Discharge status: Home / Self Care | Payer: Medicare Other | Source: Ambulatory Visit | Attending: Radiation Oncology | Admitting: Radiation Oncology

## 2017-03-27 DIAGNOSIS — Z95810 Presence of automatic (implantable) cardiac defibrillator: Secondary | ICD-10-CM | POA: Diagnosis not present

## 2017-03-27 DIAGNOSIS — Z87891 Personal history of nicotine dependence: Secondary | ICD-10-CM | POA: Diagnosis not present

## 2017-03-27 DIAGNOSIS — I2510 Atherosclerotic heart disease of native coronary artery without angina pectoris: Secondary | ICD-10-CM | POA: Diagnosis not present

## 2017-03-27 DIAGNOSIS — I509 Heart failure, unspecified: Secondary | ICD-10-CM | POA: Diagnosis not present

## 2017-03-27 DIAGNOSIS — I110 Hypertensive heart disease with heart failure: Secondary | ICD-10-CM | POA: Diagnosis not present

## 2017-03-27 DIAGNOSIS — R911 Solitary pulmonary nodule: Secondary | ICD-10-CM

## 2017-04-01 NOTE — Progress Notes (Signed)
  Radiation Oncology         [phone] ________________________________  Name: [name] MRN: [id number]  Date: 03/27/2017  DOB: 07/31/1946   STEREOTACTIC BODY RADIOTHERAPY SIMULATION AND TREATMENT PLANNING NOTE    DIAGNOSIS:  PET positive pulmonary nodule in the right upper lobe  NARRATIVE:  The patient was brought to the [place] suite.  Identity was confirmed.  All relevant records and images related to the planned course of therapy were reviewed.  The patient freely provided informed written consent to proceed with treatment after reviewing the details related to the planned course of therapy. The consent form was witnessed and verified by the simulation staff.  Then, the patient was set-up in a stable reproducible  supine position for radiation therapy.  A BodyFix immobilization pillow was fabricated for reproducible positioning.  Then I personally applied the abdominal compression paddle to limit respiratory excursion.  4D respiratoy motion management CT images were obtained.  Surface markings were placed.  The CT images were loaded into the planning software.  Then, using Cine, MIP, and standard views, the internal target volume (ITV) and planning target volumes (PTV) were delinieated, and avoidance structures were contoured.  Treatment planning then occurred.  The radiation prescription was entered and confirmed.  A total of two complex treatment devices were fabricated in the form of the BodyFix immobilization pillow and a neck accuform cushion.  I have requested : 3D Simulation  I have requested a DVH of the following structures: Heart, Lungs, Esophagus, Chest Wall, Brachial Plexus, Major Blood Vessels, and targets.  PLAN:  The patient will receive 54 Gy in 3 fractions.  -----------------------------------  Blair Promise, PhD, MD

## 2017-04-05 DIAGNOSIS — Z95810 Presence of automatic (implantable) cardiac defibrillator: Secondary | ICD-10-CM | POA: Diagnosis not present

## 2017-04-05 DIAGNOSIS — I509 Heart failure, unspecified: Secondary | ICD-10-CM | POA: Diagnosis not present

## 2017-04-05 DIAGNOSIS — R911 Solitary pulmonary nodule: Secondary | ICD-10-CM | POA: Diagnosis not present

## 2017-04-05 DIAGNOSIS — I110 Hypertensive heart disease with heart failure: Secondary | ICD-10-CM | POA: Diagnosis not present

## 2017-04-05 DIAGNOSIS — Z87891 Personal history of nicotine dependence: Secondary | ICD-10-CM | POA: Diagnosis not present

## 2017-04-05 DIAGNOSIS — I2510 Atherosclerotic heart disease of native coronary artery without angina pectoris: Secondary | ICD-10-CM | POA: Diagnosis not present

## 2017-04-09 ENCOUNTER — Ambulatory Visit
Admission: RE | Admit: 2017-04-09 | Discharge: 2017-04-09 | Disposition: A | Discharge status: Home / Self Care | Payer: Medicare Other | Source: Ambulatory Visit | Attending: Radiation Oncology | Admitting: Radiation Oncology

## 2017-04-09 DIAGNOSIS — R911 Solitary pulmonary nodule: Secondary | ICD-10-CM | POA: Diagnosis not present

## 2017-04-09 DIAGNOSIS — I110 Hypertensive heart disease with heart failure: Secondary | ICD-10-CM | POA: Diagnosis not present

## 2017-04-09 DIAGNOSIS — Z87891 Personal history of nicotine dependence: Secondary | ICD-10-CM | POA: Diagnosis not present

## 2017-04-09 DIAGNOSIS — I509 Heart failure, unspecified: Secondary | ICD-10-CM | POA: Diagnosis not present

## 2017-04-09 DIAGNOSIS — I2510 Atherosclerotic heart disease of native coronary artery without angina pectoris: Secondary | ICD-10-CM | POA: Diagnosis not present

## 2017-04-09 DIAGNOSIS — Z95810 Presence of automatic (implantable) cardiac defibrillator: Secondary | ICD-10-CM | POA: Diagnosis not present

## 2017-04-09 LAB — ACID FAST CULTURE WITH REFLEXED SENSITIVITIES (MYCOBACTERIA): Acid Fast Culture: NEGATIVE

## 2017-04-09 NOTE — Progress Notes (Signed)
  Radiation Oncology         [phone] ________________________________  Name: [name] MRN: [id number]  Date: 04/09/2017  DOB: 29-Jun-1946  Stereotactic Body Radiotherapy Treatment Procedure Note  NARRATIVE:  [name] was brought to the stereotactic radiation treatment machine and placed supine on the CT couch. The patient was set up for stereotactic body radiotherapy on the body fix pillow.  3D TREATMENT PLANNING AND DOSIMETRY:  The patient's radiation plan was reviewed and approved prior to starting treatment.  It showed 3-dimensional radiation distributions overlaid onto the planning CT.  The [facility] for the target structures as well as the organs at risk were reviewed. The documentation of this is filed in the radiation oncology EMR.  SIMULATION VERIFICATION:  The patient underwent CT imaging on the treatment unit.  These were carefully aligned to document that the ablative radiation dose would cover the target volume and maximally spare the nearby organs at risk according to the planned distribution.  SPECIAL TREATMENT PROCEDURE: [name] received high dose ablative stereotactic body radiotherapy to the planned target volume without unforeseen complications. Treatment was delivered uneventfully. The high doses associated with stereotactic body radiotherapy and the significant potential risks require careful treatment set up and patient monitoring constituting a special treatment procedure   STEREOTACTIC TREATMENT MANAGEMENT:  Following delivery, the patient was evaluated clinically. The patient tolerated treatment without significant acute effects, and was discharged to home in stable condition.    PLAN: Continue treatment as planned.  ________________________________  Blair Promise, PhD, MD  This document serves as a record of services personally performed by Gery Pray, MD. It was created on his behalf by Darcus Austin, a trained medical scribe. The creation of  this record is based on the scribe's personal observations and the provider's statements to them. This document has been checked and approved by the attending provider.

## 2017-04-10 ENCOUNTER — Ambulatory Visit: Payer: Medicare Other | Admitting: Radiation Oncology

## 2017-04-11 ENCOUNTER — Ambulatory Visit: Payer: Medicare Other | Admitting: Radiation Oncology

## 2017-04-12 ENCOUNTER — Ambulatory Visit
Admission: RE | Admit: 2017-04-12 | Discharge: 2017-04-12 | Disposition: A | Discharge status: Home / Self Care | Payer: Medicare Other | Source: Ambulatory Visit | Attending: Radiation Oncology | Admitting: Radiation Oncology

## 2017-04-12 ENCOUNTER — Ambulatory Visit: Payer: Medicare Other | Admitting: Radiation Oncology

## 2017-04-12 DIAGNOSIS — R911 Solitary pulmonary nodule: Secondary | ICD-10-CM | POA: Diagnosis not present

## 2017-04-12 DIAGNOSIS — I110 Hypertensive heart disease with heart failure: Secondary | ICD-10-CM | POA: Diagnosis not present

## 2017-04-12 DIAGNOSIS — Z95810 Presence of automatic (implantable) cardiac defibrillator: Secondary | ICD-10-CM | POA: Diagnosis not present

## 2017-04-12 DIAGNOSIS — Z87891 Personal history of nicotine dependence: Secondary | ICD-10-CM | POA: Diagnosis not present

## 2017-04-12 DIAGNOSIS — I2510 Atherosclerotic heart disease of native coronary artery without angina pectoris: Secondary | ICD-10-CM | POA: Diagnosis not present

## 2017-04-12 DIAGNOSIS — I509 Heart failure, unspecified: Secondary | ICD-10-CM | POA: Diagnosis not present

## 2017-04-13 ENCOUNTER — Ambulatory Visit: Payer: Medicare Other | Admitting: Radiation Oncology

## 2017-04-16 ENCOUNTER — Telehealth: Payer: Self-pay | Admitting: Vascular Surgery

## 2017-04-16 NOTE — Telephone Encounter (Signed)
-----   Message from Denman George, RN sent at 04/16/2017  9:50 AM EDT ----- Regarding: RE: CTA needed? Yes, due to the hx of EVAR for AAA, and of recommendation for a CTA Abd/ Pelvis from the 09/2016 office visit; as there had been concern for an endoleak. He will need to f/u with Dr. Bridgett Larsson.   ----- Message ----- From: Donzetta Matters Sent: 04/16/2017   9:31 AM To: [facility] Clinical Pool Subject: CTA needed?                                    Good Morning :-)  I'm reviewing orders and this patient was last seen by [place] in Oct 2017. Vinnie Level put in an order for a CTA Abd/pel for November 2017. It was never scheduled. The patient had a CT Chest through [facility]. Should we schedule the leftover CTA?

## 2017-04-16 NOTE — Telephone Encounter (Signed)
Left message to schedule follow up with Dr. Bridgett Larsson and order CTA abd/pel. Pt was last seen 10/06/2016 and [facility] requested to see the patient back.

## 2017-04-17 ENCOUNTER — Ambulatory Visit
Admission: RE | Admit: 2017-04-17 | Discharge: 2017-04-17 | Disposition: A | Discharge status: Home / Self Care | Payer: Medicare Other | Source: Ambulatory Visit | Attending: Radiation Oncology | Admitting: Radiation Oncology

## 2017-04-17 DIAGNOSIS — Z95810 Presence of automatic (implantable) cardiac defibrillator: Secondary | ICD-10-CM | POA: Diagnosis not present

## 2017-04-17 DIAGNOSIS — R911 Solitary pulmonary nodule: Secondary | ICD-10-CM | POA: Diagnosis not present

## 2017-04-17 DIAGNOSIS — I2510 Atherosclerotic heart disease of native coronary artery without angina pectoris: Secondary | ICD-10-CM | POA: Diagnosis not present

## 2017-04-17 DIAGNOSIS — I509 Heart failure, unspecified: Secondary | ICD-10-CM | POA: Diagnosis not present

## 2017-04-17 DIAGNOSIS — Z87891 Personal history of nicotine dependence: Secondary | ICD-10-CM | POA: Diagnosis not present

## 2017-04-17 DIAGNOSIS — I110 Hypertensive heart disease with heart failure: Secondary | ICD-10-CM | POA: Diagnosis not present

## 2017-04-17 NOTE — Progress Notes (Signed)
  Radiation Oncology         [phone] ________________________________  Name: BURYL BAMBER MRN: [id number]  Date: 04/17/2017  DOB: 1946-07-10  Stereotactic Body Radiotherapy Treatment Procedure Note  NARRATIVE:  OTHNIEL MARET was brought to the stereotactic radiation treatment machine and placed supine on the CT couch. The patient was set up for stereotactic body radiotherapy on the body fix pillow.  3D TREATMENT PLANNING AND DOSIMETRY:  The patient's radiation plan was reviewed and approved prior to starting treatment.  It showed 3-dimensional radiation distributions overlaid onto the planning CT.  The [facility] for the target structures as well as the organs at risk were reviewed. The documentation of this is filed in the radiation oncology EMR.  SIMULATION VERIFICATION:  The patient underwent CT imaging on the treatment unit.  These were carefully aligned to document that the ablative radiation dose would cover the target volume and maximally spare the nearby organs at risk according to the planned distribution.  SPECIAL TREATMENT PROCEDURE: [name] received high dose ablative stereotactic body radiotherapy to the planned target volume without unforeseen complications. Treatment was delivered uneventfully. The high doses associated with stereotactic body radiotherapy and the significant potential risks require careful treatment set up and patient monitoring constituting a special treatment procedure   STEREOTACTIC TREATMENT MANAGEMENT:  Following delivery, the patient was evaluated clinically. The patient tolerated treatment without significant acute effects, and was discharged to home in stable condition.    PLAN: Continue treatment as planned.  ________________________________  Blair Promise, PhD, MD

## 2017-04-19 ENCOUNTER — Encounter: Payer: Self-pay | Admitting: Radiation Oncology

## 2017-04-19 ENCOUNTER — Ambulatory Visit: Payer: Medicare Other | Admitting: Radiation Oncology

## 2017-04-19 NOTE — Progress Notes (Signed)
  Radiation Oncology         [phone] ________________________________  Name: RAYAAN GARGUILO MRN: [id number]  Date: 04/19/2017  DOB: 19-Nov-1946  End of Treatment Note  Diagnosis:  PET positive 1.2 cm right upper lung nodule     Indication for treatment: Curative      Radiation treatment dates:  04/09/17-04/17/17  Site/dose:  Right lung/ 54 Gy in 3 fractions  Beams/energy:  SBRT SRT-VMAT/ 6xFFF  Narrative: The patient tolerated radiation treatment relatively well. During the treatment, the patient had no complaints of coughing, fatigue, or pain.  Plan: The patient has completed radiation treatment. The patient will return to radiation oncology clinic for routine followup in one month. I advised them to call or return sooner if they have any questions or concerns related to their recovery or treatment.  -----------------------------------  Blair Promise, PhD, MD  This document serves as a record of services personally performed by Gery Pray, MD. It was created on his behalf by Bethann Humble, a trained medical scribe. The creation of this record is based on the scribe's personal observations and the provider's statements to them. This document has been checked and approved by the attending provider.

## 2017-05-12 ENCOUNTER — Other Ambulatory Visit: Payer: Self-pay | Admitting: Internal Medicine

## 2017-05-12 ENCOUNTER — Other Ambulatory Visit: Payer: Self-pay | Admitting: Cardiology

## 2017-05-12 DIAGNOSIS — I5042 Chronic combined systolic (congestive) and diastolic (congestive) heart failure: Secondary | ICD-10-CM

## 2017-05-22 ENCOUNTER — Ambulatory Visit (INDEPENDENT_AMBULATORY_CARE_PROVIDER_SITE_OTHER): Payer: Medicare Other | Admitting: *Deleted

## 2017-05-22 DIAGNOSIS — I255 Ischemic cardiomyopathy: Secondary | ICD-10-CM

## 2017-05-22 NOTE — Progress Notes (Signed)
Remote ICD transmission.   

## 2017-05-23 ENCOUNTER — Encounter: Payer: Self-pay | Admitting: Cardiology

## 2017-05-23 ENCOUNTER — Encounter: Payer: Self-pay | Admitting: Oncology

## 2017-05-23 LAB — CUP PACEART REMOTE DEVICE CHECK
Battery Remaining Longevity: 84 mo
Battery Remaining Percentage: 100 %
Brady Statistic RA Percent Paced: 0 %
Brady Statistic RV Percent Paced: 90 %
Date Time Interrogation Session: 20180613064100
HighPow Impedance: 46 Ohm
Implantable Lead Implant Date: 20060411
Implantable Lead Implant Date: 20151030
Implantable Lead Implant Date: 20151030
Implantable Lead Location: 753858
Implantable Lead Location: 753859
Implantable Lead Location: 753860
Implantable Lead Model: 148
Implantable Lead Model: 4136
Implantable Lead Serial Number: 145070
Implantable Lead Serial Number: 29713415
Implantable Pulse Generator Implant Date: 20151030
Lead Channel Impedance Value: 547 Ohm
Lead Channel Impedance Value: 576 Ohm
Lead Channel Impedance Value: 690 Ohm
Lead Channel Pacing Threshold Amplitude: 0.7 V
Lead Channel Pacing Threshold Amplitude: 0.7 V
Lead Channel Pacing Threshold Amplitude: 1.9 V
Lead Channel Pacing Threshold Pulse Width: 0.4 ms
Lead Channel Pacing Threshold Pulse Width: 0.4 ms
Lead Channel Pacing Threshold Pulse Width: 0.9 ms
Lead Channel Setting Pacing Amplitude: 2 V
Lead Channel Setting Pacing Amplitude: 2 V
Lead Channel Setting Pacing Amplitude: 3.5 V
Lead Channel Setting Pacing Pulse Width: 0.4 ms
Lead Channel Setting Pacing Pulse Width: 0.9 ms
Lead Channel Setting Sensing Sensitivity: 0.6 mV
Lead Channel Setting Sensing Sensitivity: 1 mV
Pulse Gen Serial Number: 370874

## 2017-05-24 ENCOUNTER — Ambulatory Visit
Admission: RE | Admit: 2017-05-24 | Discharge: 2017-05-24 | Disposition: A | Discharge status: Home / Self Care | Payer: Medicare Other | Source: Ambulatory Visit | Attending: Radiation Oncology | Admitting: Radiation Oncology

## 2017-05-24 VITALS — BP 109/47 | HR 70 | Temp 98.2°F | Resp 24 | Wt 264.8 lb

## 2017-05-24 DIAGNOSIS — Z7902 Long term (current) use of antithrombotics/antiplatelets: Secondary | ICD-10-CM | POA: Insufficient documentation

## 2017-05-24 DIAGNOSIS — Z7982 Long term (current) use of aspirin: Secondary | ICD-10-CM | POA: Insufficient documentation

## 2017-05-24 DIAGNOSIS — Z888 Allergy status to other drugs, medicaments and biological substances status: Secondary | ICD-10-CM | POA: Diagnosis not present

## 2017-05-24 DIAGNOSIS — Z79899 Other long term (current) drug therapy: Secondary | ICD-10-CM | POA: Diagnosis not present

## 2017-05-24 DIAGNOSIS — R911 Solitary pulmonary nodule: Secondary | ICD-10-CM | POA: Insufficient documentation

## 2017-05-24 NOTE — Progress Notes (Signed)
Radiation Oncology         [phone] ________________________________  Name: [name] MRN: [id number]  Date: 05/24/2017  DOB: 1946-06-22  Follow-Up Visit Note  CC: [name], MD  [name], *    ICD-10-CM   1. Nodule of right lung R91.1     Diagnosis: PET positive 1.2 cm right upper lungnodule    Interval Since Last Radiation: 1 month 04/09/17-04/17/17: 54 Gy to the right lung in 3 fractions  Narrative:  The patient returns today for routine follow-up. He denies pain at this time. He notes improving energy and a good appetite. He notes shortness of breath and reports using a CPAP at night. He attributes the increased shortness of breath to a 15 pound weight gain in the past month. He reports using a wheelchair for long distances. He denies a cough.                         ALLERGIES:  is allergic to tussionex pennkinetic er [hydrocod polst-cpm polst er] and ace inhibitors.  Meds: Current Outpatient Prescriptions  Medication Sig Dispense Refill  . albuterol (PROVENTIL HFA;VENTOLIN HFA) 108 (90 BASE) MCG/ACT inhaler Inhale 2 puffs into the lungs every 6 (six) hours as needed for wheezing or shortness of breath. 3 Inhaler 4  . albuterol (PROVENTIL) (2.5 MG/3ML) 0.083% nebulizer solution Take 3 mLs (2.5 mg total) by nebulization every 6 (six) hours. 120 mL 3  . amiodarone (PACERONE) 200 MG tablet TAKE 1 TABLET BY MOUTH  DAILY 90 tablet 3  . aspirin 81 MG tablet Take 81 mg by mouth daily.    Marland Kitchen atorvastatin (LIPITOR) 40 MG tablet TAKE 1 TABLET BY MOUTH  DAILY 90 tablet 3  . carvedilol (COREG) 25 MG tablet TAKE 1 TABLET BY MOUTH  TWICE A DAY WITH MEALS 180 tablet 3  . clopidogrel (PLAVIX) 75 MG tablet TAKE 1 TABLET BY MOUTH  DAILY 90 tablet 3  . ENTRESTO 97-103 MG TAKE 1 TABLET BY MOUTH TWICE A DAY 60 tablet 6  . finasteride (PROSCAR) 5 MG tablet Take 5 mg by mouth daily.    . fluticasone (FLONASE) 50 MCG/ACT nasal spray Instill 1 spray in each  nostril daily  32 g 3  . Fluticasone-Umeclidin-Vilant (TRELEGY ELLIPTA) 100-62.5-25 MCG/INH AEPB Inhale 1 puff into the lungs daily. 30 each 5  . furosemide (LASIX) 80 MG tablet Take 0.5 tablets (40 mg total) by mouth 2 (two) times daily. 180 tablet 1  . ipratropium (ATROVENT HFA) 17 MCG/ACT inhaler Inhale 2 puffs into the lungs every 4 (four) hours as needed for wheezing. 1 Inhaler 12  . isosorbide mononitrate (IMDUR) 60 MG 24 hr tablet TAKE 1 TABLET BY MOUTH  DAILY 90 tablet 3  . levothyroxine (SYNTHROID, LEVOTHROID) 50 MCG tablet TAKE 1 TABLET BY MOUTH  DAILY BEFORE BREAKFAST 90 tablet 1  . NON FORMULARY at bedtime. CPAP And during the day as needed    . spironolactone (ALDACTONE) 25 MG tablet TAKE 1 TABLET BY MOUTH  DAILY 90 tablet 3  . nitroGLYCERIN (NITROSTAT) 0.4 MG SL tablet DISSOLVE ONE TABLET UNDER  THE TONGUE EVERY 5 MINUTES  AS NEEDED FOR CHEST PAIN  (UP TO 3 TABS IN 15 MINUTES THEN CALL 911) (Patient not taking: Reported on 05/24/2017) 75 tablet 0   No current facility-administered medications for this encounter.     Physical Findings: The patient is in no acute distress. Patient is alert and oriented.  weight is 264 lb 12.8 oz (120.1 kg). His oral temperature is 98.2 F (36.8 C). His blood pressure is 109/47 (abnormal) and his pulse is 70. His respiration is 24 (abnormal) and oxygen saturation is 96%. .  No significant changes. Lungs are clear to auscultation bilaterally. Heart has regular rate and rhythm. No palpable cervical, supraclavicular, or axillary adenopathy.   Lab Findings: Lab Results  Component Value Date   WBC 7.0 02/20/2017   HGB 13.6 02/20/2017   HCT 40.6 02/20/2017   MCV 93.8 02/20/2017   PLT 237.0 02/20/2017    Radiographic Findings: No results found.  Impression:  The patient is recovering from the effects of radiation.   Plan:  Schedule chest CT scan in three months. Follow-up after scan.  ____________________________________    This document serves as a  record of services personally performed by Gery Pray, MD. It was created on his behalf by Bethann Humble, a trained medical scribe. The creation of this record is based on the scribe's personal observations and the provider's statements to them. This document has been checked and approved by the attending provider.

## 2017-05-24 NOTE — Progress Notes (Signed)
[name] is here today for 1 month follow up for right upper lung nodule radiation.  Patient denies having any pain.  Patient reports no issues with appetite and reports improving energy with what his shortness of breath will allow.  Patient presently short of breath, respirations 24 bpm, no oxygen use, posturing for better airflow is witnessed. Uses CPAP at night.  Patient states he is more short of breath because of excessive weight gain, past month (15 lbs)  and when he loses weight he does notice he can breath better.  Patient states he has to use wheelchair with distance or when he has to stand for lengthy periods of time.  Patient also states he uses his cane when his vertigo acts up.  Denies cough, no sputum.  VSS stable.  No concerns or compliants.      Vitals:   05/24/17 0947  BP: (!) 109/47  Pulse: 70  Resp: (!) 24  Temp: 98.2 F (36.8 C)  TempSrc: Oral  SpO2: 96%  Weight: 264 lb 12.8 oz (120.1 kg)    Wt Readings from Last 3 Encounters:  05/24/17 264 lb 12.8 oz (120.1 kg)  03/19/17 249 lb 12.8 oz (113.3 kg)  02/21/17 234 lb (106.1 kg)

## 2017-05-24 NOTE — Addendum Note (Signed)
Encounter addended by: Wilmon Arms, RN on: 05/24/2017 10:52 AM<BR>    Actions taken: Charge Capture section accepted

## 2017-06-07 ENCOUNTER — Encounter: Payer: Self-pay | Admitting: Cardiology

## 2017-06-25 ENCOUNTER — Other Ambulatory Visit: Payer: Self-pay | Admitting: *Deleted

## 2017-06-25 DIAGNOSIS — I714 Abdominal aortic aneurysm, without rupture, unspecified: Secondary | ICD-10-CM

## 2017-06-26 ENCOUNTER — Other Ambulatory Visit: Payer: Self-pay | Admitting: *Deleted

## 2017-06-26 DIAGNOSIS — I714 Abdominal aortic aneurysm, without rupture, unspecified: Secondary | ICD-10-CM

## 2017-06-27 ENCOUNTER — Telehealth: Payer: Self-pay | Admitting: Vascular Surgery

## 2017-06-27 NOTE — Telephone Encounter (Signed)
Per instructions from [place] and also per the telephone encounter from 04/16/17 from Ammie Dalton, I scheduled this patient an appointment to have a CTA Abd/Pelvis on Friday 07/20/17 at 12:30pm. The patient is to arrive at 12 noon for blood work prior. [place] location. No solid foods 4 hours prior but liquids and medications are fine. Then he is scheduled to see Dr.Chen here at our office at 1:30pm that same day.  I left detailed VM's for the patient's wife [name] and will also mail the information the patient. awt

## 2017-07-03 ENCOUNTER — Encounter: Payer: Self-pay | Admitting: Vascular Surgery

## 2017-07-14 ENCOUNTER — Other Ambulatory Visit (HOSPITAL_COMMUNITY): Payer: Self-pay | Admitting: Cardiology

## 2017-07-16 ENCOUNTER — Ambulatory Visit (HOSPITAL_COMMUNITY)
Admission: RE | Admit: 2017-07-16 | Discharge: 2017-07-16 | Disposition: A | Discharge status: Home / Self Care | Payer: Medicare Other | Source: Ambulatory Visit | Attending: Cardiology | Admitting: Cardiology

## 2017-07-16 ENCOUNTER — Encounter (HOSPITAL_COMMUNITY): Payer: Self-pay | Admitting: Cardiology

## 2017-07-16 VITALS — BP 150/72 | HR 67 | Wt 262.5 lb

## 2017-07-16 DIAGNOSIS — Z888 Allergy status to other drugs, medicaments and biological substances status: Secondary | ICD-10-CM | POA: Diagnosis not present

## 2017-07-16 DIAGNOSIS — D509 Iron deficiency anemia, unspecified: Secondary | ICD-10-CM | POA: Insufficient documentation

## 2017-07-16 DIAGNOSIS — I739 Peripheral vascular disease, unspecified: Secondary | ICD-10-CM | POA: Diagnosis not present

## 2017-07-16 DIAGNOSIS — G4733 Obstructive sleep apnea (adult) (pediatric): Secondary | ICD-10-CM | POA: Insufficient documentation

## 2017-07-16 DIAGNOSIS — Z800 Family history of malignant neoplasm of digestive organs: Secondary | ICD-10-CM | POA: Insufficient documentation

## 2017-07-16 DIAGNOSIS — Z6839 Body mass index (BMI) 39.0-39.9, adult: Secondary | ICD-10-CM | POA: Insufficient documentation

## 2017-07-16 DIAGNOSIS — E1151 Type 2 diabetes mellitus with diabetic peripheral angiopathy without gangrene: Secondary | ICD-10-CM | POA: Diagnosis not present

## 2017-07-16 DIAGNOSIS — I447 Left bundle-branch block, unspecified: Secondary | ICD-10-CM | POA: Diagnosis not present

## 2017-07-16 DIAGNOSIS — I5022 Chronic systolic (congestive) heart failure: Secondary | ICD-10-CM | POA: Diagnosis not present

## 2017-07-16 DIAGNOSIS — Z87891 Personal history of nicotine dependence: Secondary | ICD-10-CM | POA: Insufficient documentation

## 2017-07-16 DIAGNOSIS — N400 Enlarged prostate without lower urinary tract symptoms: Secondary | ICD-10-CM | POA: Diagnosis not present

## 2017-07-16 DIAGNOSIS — Z8679 Personal history of other diseases of the circulatory system: Secondary | ICD-10-CM | POA: Diagnosis not present

## 2017-07-16 DIAGNOSIS — C3491 Malignant neoplasm of unspecified part of right bronchus or lung: Secondary | ICD-10-CM | POA: Insufficient documentation

## 2017-07-16 DIAGNOSIS — Z833 Family history of diabetes mellitus: Secondary | ICD-10-CM | POA: Insufficient documentation

## 2017-07-16 DIAGNOSIS — I5042 Chronic combined systolic (congestive) and diastolic (congestive) heart failure: Secondary | ICD-10-CM

## 2017-07-16 DIAGNOSIS — I255 Ischemic cardiomyopathy: Secondary | ICD-10-CM | POA: Insufficient documentation

## 2017-07-16 DIAGNOSIS — Z7982 Long term (current) use of aspirin: Secondary | ICD-10-CM | POA: Diagnosis not present

## 2017-07-16 DIAGNOSIS — I110 Hypertensive heart disease with heart failure: Secondary | ICD-10-CM | POA: Insufficient documentation

## 2017-07-16 DIAGNOSIS — J441 Chronic obstructive pulmonary disease with (acute) exacerbation: Secondary | ICD-10-CM | POA: Diagnosis not present

## 2017-07-16 DIAGNOSIS — I714 Abdominal aortic aneurysm, without rupture: Secondary | ICD-10-CM | POA: Diagnosis not present

## 2017-07-16 DIAGNOSIS — I472 Ventricular tachycardia: Secondary | ICD-10-CM | POA: Insufficient documentation

## 2017-07-16 DIAGNOSIS — E039 Hypothyroidism, unspecified: Secondary | ICD-10-CM | POA: Insufficient documentation

## 2017-07-16 DIAGNOSIS — Z951 Presence of aortocoronary bypass graft: Secondary | ICD-10-CM | POA: Insufficient documentation

## 2017-07-16 DIAGNOSIS — Z7902 Long term (current) use of antithrombotics/antiplatelets: Secondary | ICD-10-CM | POA: Insufficient documentation

## 2017-07-16 DIAGNOSIS — I252 Old myocardial infarction: Secondary | ICD-10-CM | POA: Insufficient documentation

## 2017-07-16 DIAGNOSIS — K219 Gastro-esophageal reflux disease without esophagitis: Secondary | ICD-10-CM | POA: Insufficient documentation

## 2017-07-16 DIAGNOSIS — Z801 Family history of malignant neoplasm of trachea, bronchus and lung: Secondary | ICD-10-CM | POA: Insufficient documentation

## 2017-07-16 DIAGNOSIS — I2510 Atherosclerotic heart disease of native coronary artery without angina pectoris: Secondary | ICD-10-CM | POA: Diagnosis not present

## 2017-07-16 DIAGNOSIS — Z809 Family history of malignant neoplasm, unspecified: Secondary | ICD-10-CM | POA: Insufficient documentation

## 2017-07-16 DIAGNOSIS — E785 Hyperlipidemia, unspecified: Secondary | ICD-10-CM

## 2017-07-16 DIAGNOSIS — Z8371 Family history of colonic polyps: Secondary | ICD-10-CM | POA: Insufficient documentation

## 2017-07-16 DIAGNOSIS — Z95810 Presence of automatic (implantable) cardiac defibrillator: Secondary | ICD-10-CM | POA: Insufficient documentation

## 2017-07-16 LAB — COMPREHENSIVE METABOLIC PANEL
ALT: 25 U/L (ref 17–63)
AST: 24 U/L (ref 15–41)
Albumin: 3.8 g/dL (ref 3.5–5.0)
Alkaline Phosphatase: 68 U/L (ref 38–126)
Anion gap: 10 (ref 5–15)
BUN: 14 mg/dL (ref 6–20)
CO2: 29 mmol/L (ref 22–32)
Calcium: 9.2 mg/dL (ref 8.9–10.3)
Chloride: 105 mmol/L (ref 101–111)
Creatinine, Ser: 0.78 mg/dL (ref 0.61–1.24)
GFR calc Af Amer: >60 mL/min (ref >60)
GFR calc non Af Amer: >60 mL/min (ref >60)
Glucose, Bld: 101 mg/dL — ABNORMAL HIGH (ref 65–99)
Potassium: 3.3 mmol/L — ABNORMAL LOW (ref 3.5–5.1)
Sodium: 144 mmol/L (ref 135–145)
Total Bilirubin: 0.8 mg/dL (ref 0.3–1.2)
Total Protein: 6.5 g/dL (ref 6.5–8.1)

## 2017-07-16 LAB — TSH: TSH: 4.354 u[IU]/mL (ref 0.350–4.500)

## 2017-07-16 LAB — BRAIN NATRIURETIC PEPTIDE: B Natriuretic Peptide: 167.6 pg/mL — ABNORMAL HIGH (ref 0.0–100.0)

## 2017-07-16 LAB — LIPID PANEL
Cholesterol: 135 mg/dL (ref 0–200)
HDL: 38 mg/dL — ABNORMAL LOW (ref >40)
LDL Cholesterol: 66 mg/dL (ref 0–99)
Total CHOL/HDL Ratio: 3.6 RATIO
Triglycerides: 157 mg/dL — ABNORMAL HIGH (ref <150)
VLDL: 31 mg/dL (ref 0–40)

## 2017-07-16 MED ORDER — TORSEMIDE 20 MG PO TABS: 80.0000 mg | ORAL_TABLET | Freq: Every day | ORAL | 3 refills | Status: DC

## 2017-07-16 NOTE — Patient Instructions (Signed)
Stop Furosemide  Start Torsemide 80 mg (4 tabs) daily  Labs today  Your physician recommends that you schedule a follow-up appointment in: 2 weeks with echocardiogram

## 2017-07-17 NOTE — Progress Notes (Signed)
Patient ID: [name], male   DOB: December 09, 1946, 71 y.o.   MRN: [id number] [facility]  07/17/2017 [facility]   1946/07/05  [id number]  Primary Physicia [name], MD Hematologist: Dr Alen Blew  Pulmonary: Dr Annamaria Boots Cardiology: [name]  HPI:  [name] is a pleasant 71 yo with a past medical history significant for morbid obesity, systolic heart failure with an EF of 30-35% (January 2014) -> 20-25% (March 2015 - RV normal), VT on amio, CAD s/p CABG 1996, LBBB, COPD, obstructive sleep apnea on CPAP, AAA repair (January 2015) who was admitted to [facility] on 02/24/14 for acute on chronic HF and acute COPD exacerbation. He also had iron deficiency anemia.   Admitted to [facility] July 24 through July 06 2014 with chest pain.  CEs negative. Had a LHC with no change from previous LHC with recommendations to continue medical management. Discharge weight was 255 pounds.   LHC 07/06/14 --No significant change from previous studies.  Left anterior descending (LAD): The LAD is a large vessel. There is a 40% stenosis immediately after the takeoff of a large septal perforator. There are 2 large diagonal branches without significant disease. Left circumflex (LCx): The LCx is occluded proximally. There are left to left collaterals.  Right coronary artery (RCA): The RCA is occluded proximally immediately following the conus branch. There are right to right and left to right collaterals. SVGs from CABG known to be totally occluded.   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   CTA chest/abd/pelvis (3/16) with moderate emphysema, 1.3 cm nodule RUL, left SFA totally occluded, right SFA with significant stenosis, s/p endovascular AAA repair (stable).   Echo (5/17) with EF 20-25%, severe LV dilation, mild MR, normal RV size with mildly decreased systolic function.   He has a RUL nodule that is most likely lung cancer, he is being treated with radiation.   He remains on goal doses of cardiac  meds.  Weight today is up 23 lbs.  He reports feeling depressed about lung cancer, eating more, and not paying much attention to sodium intake.  He is short of breath after walking 50-100 feet.  No chest pain, no orthopnea/PND. No lightheadedness.   Labs 3/15 Cr 1.5 K 5.6 Labs 07/06/14 K 3.9 Creatinine  0.98 Labs 9/15 K 5.1, creatinine 0.96 Labs 11/15 K 3.8, creatinine 0.83, LDL 61, LFTs normal, TSH normal Labs 11/26/14 K 5.0 Creatinine 0.73  Labs 12/08/14 K 3.4 Creatinine 0.83  Labs 3/16 K 4.4, creatinine 1.03, LFTs normal, TSH normal, HCT 37.8, BNP 65 Labs 6/16 K 4.1, creatinine 0.93, TSH normal, LFTs normal Labs 11/16 K 5.1, creatinine 1.13, LFTs normal, TSH normal, LDL 83, HDL 33, TGs 162 Labs 1/17 K 4.4, creatinine 0.86, pro-BNP 154 Labs 2/17 BNP 113, K 3.8, creatinine 0.79, TSH normal, LFTs normal Labs 4/17 K 3.8, creatinine 0.91, HCT 48.1 Labs 6/17 K 3.5, creatinine 0.98 Labs 9/17 K 3.9, creatinine 0.79, LDL 69, HDL 29, TSH normal, LFTs normal Labs 3/18 K 3.6, creatinine 0.87  ROS: All systems reviewed and negative except as per HPI.   Past Medical History:  Diagnosis Date  . AAA (abdominal aortic aneurysm) ([place])    followed by Dr. Bridgett Larsson  . Adenomatous colon polyp 01/2004  . Anemia    hx  . Automatic implantable cardioverter-defibrillator in situ   . AVM (arteriovenous malformation)   . BPH (benign prostatic hypertrophy)   . CAD (coronary artery disease)    s/p CABGx2 in 1996  .  Carotid artery stenosis    LCEA - Dr. Bridgett Larsson in 2013  . CHF (congestive heart failure) ([place])   . Complication of anesthesia    claustrophobic, unabe to lie on back more than 4 hours at time due to back  . COPD (chronic obstructive pulmonary disease) ([place])   . Diabetes mellitus    DIET CONTROLLED- pt states that this was a misdiagnosis, he was treated while in the hospital  but returned home, loss a massive amount of weight and he has not had a problem with his blood sugar since. States everything has  been normal for about 3 years.  . Diverticulosis   . Dyspnea   . Dysrhythmia    ICD-defibrillator  . Fatigue   . GERD (gastroesophageal reflux disease)   . H/O hiatal hernia   . History of radiation therapy 04/09/17-04/17/17   SBRT right lung 54 Gy in 3 fractions  . HLD (hyperlipidemia)   . Hypertension   . Hypothyroidism   . Ischemic cardiomyopathy   . Myocardial infarction ([place])   . OSA on CPAP    AHI durign total sleep 14.69/hr, during REM 50.91/hr  . Peripheral vascular disease ([facility])    LCEA, L renal artery stent, bilat iliac stents, R SFA stenosis  . Tremor   . Ventricular tachycardia ([place]) 09/09/2014   Amiodarone was started after appropriate defibrillator shocks for ventricular tachycardia in October 2008    Current Outpatient Prescriptions  Medication Sig Dispense Refill  . albuterol (PROVENTIL HFA;VENTOLIN HFA) 108 (90 BASE) MCG/ACT inhaler Inhale 2 puffs into the lungs every 6 (six) hours as needed for wheezing or shortness of breath. 3 Inhaler 4  . albuterol (PROVENTIL) (2.5 MG/3ML) 0.083% nebulizer solution Take 3 mLs (2.5 mg total) by nebulization every 6 (six) hours. 120 mL 3  . amiodarone (PACERONE) 200 MG tablet TAKE 1 TABLET BY MOUTH  DAILY 90 tablet 3  . aspirin 81 MG tablet Take 81 mg by mouth daily.    Marland Kitchen atorvastatin (LIPITOR) 40 MG tablet TAKE 1 TABLET BY MOUTH  DAILY 90 tablet 3  . carvedilol (COREG) 25 MG tablet TAKE 1 TABLET BY MOUTH  TWICE A DAY WITH MEALS 180 tablet 3  . clopidogrel (PLAVIX) 75 MG tablet TAKE 1 TABLET BY MOUTH  DAILY 90 tablet 3  . ENTRESTO 97-103 MG TAKE 1 TABLET BY MOUTH TWICE A DAY 60 tablet 6  . finasteride (PROSCAR) 5 MG tablet Take 5 mg by mouth daily.    . fluticasone (FLONASE) 50 MCG/ACT nasal spray Instill 1 spray in each  nostril daily 32 g 3  . Fluticasone-Umeclidin-Vilant (TRELEGY ELLIPTA) 100-62.5-25 MCG/INH AEPB Inhale 1 puff into the lungs daily. 30 each 5  . furosemide (LASIX) 80 MG tablet Take 80 mg by mouth 2 (two) times  daily.    Marland Kitchen ipratropium (ATROVENT HFA) 17 MCG/ACT inhaler Inhale 2 puffs into the lungs every 4 (four) hours as needed for wheezing. 1 Inhaler 12  . isosorbide mononitrate (IMDUR) 60 MG 24 hr tablet TAKE 1 TABLET BY MOUTH  DAILY 90 tablet 3  . levothyroxine (SYNTHROID, LEVOTHROID) 50 MCG tablet TAKE 1 TABLET BY MOUTH  DAILY BEFORE BREAKFAST 90 tablet 1  . NON FORMULARY at bedtime. CPAP And during the day as needed    . spironolactone (ALDACTONE) 25 MG tablet TAKE 1 TABLET BY MOUTH  DAILY 90 tablet 3  . nitroGLYCERIN (NITROSTAT) 0.4 MG SL tablet DISSOLVE ONE TABLET UNDER  THE TONGUE EVERY 5 MINUTES  AS NEEDED FOR  CHEST PAIN  (UP TO 3 TABS IN 15 MINUTES THEN CALL 911) (Patient not taking: Reported on 05/24/2017) 75 tablet 0  . torsemide (DEMADEX) 20 MG tablet Take 4 tablets (80 mg total) by mouth daily. 120 tablet 3   No current facility-administered medications for this encounter.     Allergies  Allergen Reactions  . Tussionex Pennkinetic Er [Hydrocod Polst-Cpm Polst Er] Other (See Comments)    UNSPECIFIED REACTION  > "caused prostate problems"  . Ace Inhibitors Cough    Social History   Social History  . Marital status: Married    Spouse name: [name]  . Number of children: 2  . Years of education: [name]   Occupational History  . retired Solicitor     Social History Main Topics  . Smoking status: Former Smoker    Packs/day: 1.50    Years: 40.00    Types: Cigarettes, Pipe    Quit date: 12/11/2002  . Smokeless tobacco: Never Used  . Alcohol use No  . Drug use: No  . Sexual activity: Not Currently   Other Topics Concern  . Not on file   Social History Narrative  . No narrative on file    Family History  Problem Relation Age of Onset  . Lung cancer Mother   . Cancer Mother   . Diabetes Mother   . Hypertension Mother   . Hyperlipidemia Mother   . Heart disease Mother        before age 23  . Heart attack Father   . Heart disease Father        before age 59   . Hypertension Father   . Colon cancer Sister   . Cancer Sister   . Hypertension Sister   . Hyperlipidemia Sister   . Diabetes Sister   . Heart disease Sister        before age 15  . Heart disease Brother   . Heart attack Maternal Grandmother   . Colon polyps Sister   . COPD Sister   . Diabetes Daughter    Blood pressure (!) 150/72, pulse 67, weight 262 lb 8 oz (119.1 kg), SpO2 97 %.   Wt Readings from Last 3 Encounters:  07/16/17 262 lb 8 oz (119.1 kg)  05/24/17 264 lb 12.8 oz (120.1 kg)  03/19/17 249 lb 12.8 oz (113.3 kg)    General: NAD Neck: Thick, JVP 9-10 cm with HJR, no thyromegaly or thyroid nodule.  Lungs: Clear to auscultation bilaterally with normal respiratory effort. CV: Nondisplaced PMI.  Heart regular S1/S2, no S3/S4, no murmur.  1+ edema to knees bilaterally.  No carotid bruit.  Unable to palpate pedal pulses.  Abdomen: Soft, nontender, no hepatosplenomegaly, no distention.  Skin: Intact without lesions or rashes.  Neurologic: Alert and oriented x 3.  Psych: Normal affect. Extremities: No clubbing or cyanosis.  HEENT: Normal.   ASSESSMENT/PLAN:  1. Chronic systolic CHF: Ischemic cardiomyopathy. ECHO 5/17 EF 20-25% with severe LV dilation.  [employer] CRT-D. NYHA class III symptoms, worse compared to past.  He is volume overloaded on exam.  - Stop Lasix, start torsemide 80 mg daily. BMET today and repeat in 10 days.  - I will arrange for repeat echo.  - Continue carvedilol 25 mg twice a day  - Continue Entresto 97/103 bid.     - Continue spironolactone 25 mg daily.  - Needs to decrease dietary sodium.  2. CAD s/p CABG: No chest pain. Continue 81 mg aspirin daily  and atorvastatin daily.   - Check lipids today.   3. VT s/p ICD: On amiodarone for history of ICD dischargTes due to VT.   - Check LFTs and TSH today. Knows he needs yearly eye exams.   4. Morbid obesity: Needs to work on diet/weight loss.  5. COPD: Moderate to severe.  6. Lung cancer:  Undergoing radiation.  7. PAD: Occluded left SFA and significant right SFA stenosis on last CTA.  He does not report claudication and does not have pedal ulcers.  Followed by [facility]. He is on statin, check lipids today.  8. AAA: s/p repair.  Followed at [facility], has appointment tomorrow.   [name] 07/17/2017

## 2017-07-17 NOTE — Progress Notes (Signed)
Established EVAR   History of Present Illness   [name] is a 71 y.o. (19-Dec-1945) male who presents for routine follow up s/p EVAR (Date: 01/06/14).  Most recent EVAR duplex (Date: 10/06/16) demonstrates: no endoleak and ? Increased sac size.  Most recent CTA (Date: 02/19/15) demonstrates: no endoleak and slightly decreased sac size.  The patient has never had back or abdominal pain.  Since his prior visit with myself, the patient has been diagnosed with lung cancer and is getting XRT for such.  Pt is also in the middle of an episode of CHF decompensation.  The patient's PMH, PSH, SH, and FamHx are unchanged from 10/06/16.  Current Outpatient Prescriptions  Medication Sig Dispense Refill  . albuterol (PROVENTIL HFA;VENTOLIN HFA) 108 (90 BASE) MCG/ACT inhaler Inhale 2 puffs into the lungs every 6 (six) hours as needed for wheezing or shortness of breath. 3 Inhaler 4  . albuterol (PROVENTIL) (2.5 MG/3ML) 0.083% nebulizer solution Take 3 mLs (2.5 mg total) by nebulization every 6 (six) hours. 120 mL 3  . amiodarone (PACERONE) 200 MG tablet TAKE 1 TABLET BY MOUTH  DAILY 90 tablet 3  . aspirin 81 MG tablet Take 81 mg by mouth daily.    Marland Kitchen atorvastatin (LIPITOR) 40 MG tablet TAKE 1 TABLET BY MOUTH  DAILY 90 tablet 3  . carvedilol (COREG) 25 MG tablet TAKE 1 TABLET BY MOUTH  TWICE A DAY WITH MEALS 180 tablet 3  . clopidogrel (PLAVIX) 75 MG tablet TAKE 1 TABLET BY MOUTH  DAILY 90 tablet 3  . ENTRESTO 97-103 MG TAKE 1 TABLET BY MOUTH TWICE A DAY 60 tablet 6  . finasteride (PROSCAR) 5 MG tablet Take 5 mg by mouth daily.    . fluticasone (FLONASE) 50 MCG/ACT nasal spray Instill 1 spray in each  nostril daily 32 g 3  . Fluticasone-Umeclidin-Vilant (TRELEGY ELLIPTA) 100-62.5-25 MCG/INH AEPB Inhale 1 puff into the lungs daily. 30 each 5  . furosemide (LASIX) 80 MG tablet Take 80 mg by mouth 2 (two) times daily.    Marland Kitchen ipratropium (ATROVENT HFA) 17 MCG/ACT inhaler Inhale 2 puffs into the lungs  every 4 (four) hours as needed for wheezing. 1 Inhaler 12  . isosorbide mononitrate (IMDUR) 60 MG 24 hr tablet TAKE 1 TABLET BY MOUTH  DAILY 90 tablet 3  . levothyroxine (SYNTHROID, LEVOTHROID) 50 MCG tablet TAKE 1 TABLET BY MOUTH  DAILY BEFORE BREAKFAST 90 tablet 1  . nitroGLYCERIN (NITROSTAT) 0.4 MG SL tablet DISSOLVE ONE TABLET UNDER  THE TONGUE EVERY 5 MINUTES  AS NEEDED FOR CHEST PAIN  (UP TO 3 TABS IN 15 MINUTES THEN CALL 911) (Patient not taking: Reported on 05/24/2017) 75 tablet 0  . NON FORMULARY at bedtime. CPAP And during the day as needed    . spironolactone (ALDACTONE) 25 MG tablet TAKE 1 TABLET BY MOUTH  DAILY 90 tablet 3  . torsemide (DEMADEX) 20 MG tablet Take 4 tablets (80 mg total) by mouth daily. 120 tablet 3   No current facility-administered medications for this visit.     On ROS today: +SOB, fluid overload   Physical Examination   Vitals:   07/20/17 1331  BP: (!) 152/74  Pulse: 71  Resp: (!) 22  Temp: (!) 97.2 F (36.2 C)  TempSrc: Oral  SpO2: 93%  Weight: 259 lb (117.5 kg)  Height: 5\' 8"  (1.727 m)   Body mass index is 39.38 kg/m.  General Alert, O x 3, Obese, Ill appearing  Pulmonary Sym  exp, Decreased B air movt, rales on BLL, dyspneic  Cardiac RRR, Nl S1, S2, no Murmurs, No rubs, +S3, no S4  Vascular Vessel Right Left  Radial Palpable Palpable  Brachial Palpable Palpable  Carotid Palpable, No Bruit Palpable, No Bruit  Aorta Not palpable [name]  Femoral Not palpable due to positioning Not palpable due to positioning  Popliteal Not palpable Not palpable  PT Not palpable Not palpable  DP Not palpable Not palpable    Gastro- intestinal soft, non-distended, non-tender to palpation, No guarding or rebound, no HSM, no masses, no CVAT B, No palpable prominent aortic pulse,    Musculo- skeletal M/S 5/5 throughout  , Extremities without ischemic changes  , Non-pitting edema present: B 2+, No visible varicosities , No Lipodermatosclerosis present  Neurologic  Pain and light touch intact in extremities , Motor exam as listed above    Radiology     Ct Angio Abdomen Pelvis  W &/or Wo Contrast  Result Date: 07/20/2017 CLINICAL DATA:  Status post EVAR to repair abdominal aortic aneurysm on 01/06/2014. EXAM: CT ANGIOGRAPHY ABDOMEN AND PELVIS WITH CONTRAST AND WITHOUT CONTRAST TECHNIQUE: Multidetector CT imaging of the abdomen and pelvis was performed using the standard protocol during bolus administration of intravenous contrast. Multiplanar reconstructed images and MIPs were obtained and reviewed to evaluate the vascular anatomy. CONTRAST:  75 mL Isovue 370 IV COMPARISON:  02/19/2015 and 02/13/2014 FINDINGS: VASCULAR Aorta: Positioning of the aortic endograft is stable with the left iliac limb again noted to be just barely above the gate of the main body of the endograft. There is no evidence of endoleak. The only change in appearance at the level of the endograft itself is interval development of a mild amount of anterior mural thrombus within the main body measuring approximately 6 mm in maximal thickness. This is not associated with expansion of the endograft. Maximal aneurysm sac size is estimated to be approximately 4.6 x 5.0 cm just above the endograft bifurcation which represents slight diminishment since the prior study where comparable the measurements obtained today are approximately 4.8 x 5.2 cm. Celiac: Mild disease at the celiac origin appears stable without significant stenosis. SMA: Stable plaque of proximal SMA without significant stenosis. Renals: 2 separate right renal arteries show no significant disease. Stented single left renal artery is patent. IMA: Chronically occluded origin. Inflow: Stable iliac patency. Stable narrowing of the left iliac limb as it courses posterior to the right iliac limb. Just distal to the landing zone of the left iliac limb, there does appear to be potentially significant disease of the distal common iliac artery.  However, due to lack of optimal opacification as well as body habitus, degree of stenosis cannot be accurately determined. Proximal Outflow: Common femoral artery is a appear normally patent bilaterally. Again noted is probable occlusion of the proximal left superficial femoral artery. Review of the MIP images confirms the above findings. NON-VASCULAR Lower chest: No acute abnormality. Hepatobiliary: No focal liver abnormality is seen. No gallstones, gallbladder wall thickening, or biliary dilatation. Pancreas: Unremarkable. No pancreatic ductal dilatation or surrounding inflammatory changes. Spleen: Normal in size without focal abnormality. Adrenals/Urinary Tract: Adrenal glands are unremarkable. Kidneys are normal, without renal calculi, focal lesion, or hydronephrosis. Bladder is unremarkable. Stomach/Bowel: Bowel is unremarkable and shows no evidence of obstruction, inflammation or lesion. Lymphatic: No enlarged lymph nodes are identified. Reproductive: Prostate is unremarkable. Other: No abdominal wall hernia or abnormality. No abdominopelvic ascites. Musculoskeletal: No acute or significant osseous findings. IMPRESSION: VASCULAR 1. No evidence  of endoleak with slightly decreasing size of the abdominal aortic aneurysm sac. The only change in appearance of the endograft is development of a small amount of anterior mural thrombus within the main body of the graft measuring approximately 6 mm in maximal thickness. 2. Potential significant stenosis involving the distal aspect of the native left common iliac artery just beyond the endograft. This is not well evaluated on the study. 3. Left SFA occlusion just beyond its origin. NON-VASCULAR No findings. Electronically Signed   By: Aletta Edouard M.D.   On: 07/20/2017 13:43   Based on my review of the CTA, there is no endoleak.  Main body is in appropriate position a few mm below renals.  Iliac limb appear to be obscuring each other no frank evidence of limb  dysfunction.   Medical Decision Making   [name] is a 71 y.o. male who presents s/p EVAR (01/06/14), acute on chronic heart failure, Lung cancer   In regards to his AAA, he is asymptomatic with decreasing sac size.  The prior increase on EVAR duplex reflects the human nature of that modality.  In regards to the question of iliac limb stenosis raised by the CTA, I would simply get an aortoiliac duplex next time.  At this point, the patient is NOT medically stable to consider any further imaging.  I discussed with the patient the importance of surveillance of the endograft.  The next endograft duplex will be scheduled for 6 months.  The patient will follow up with [place] in 6 months with these studies.  Thank you for allowing [place] to participate in this patient's care.   Adele Barthel, MD, FACS [facility] Office: [phone] Pager: [phone]

## 2017-07-18 ENCOUNTER — Other Ambulatory Visit: Payer: Self-pay | Admitting: Cardiovascular Disease

## 2017-07-18 ENCOUNTER — Telehealth (HOSPITAL_COMMUNITY): Payer: Self-pay | Admitting: *Deleted

## 2017-07-18 MED ORDER — POTASSIUM CHLORIDE CRYS ER 20 MEQ PO TBCR: 20.0000 meq | EXTENDED_RELEASE_TABLET | Freq: Every day | ORAL | 3 refills | Status: DC

## 2017-07-18 NOTE — Telephone Encounter (Signed)
-----   Message from Larey Dresser, MD sent at 07/17/2017  5:13 PM EDT ----- Good lipids.  Add KCl 20 daily. BMET 1 week.

## 2017-07-20 ENCOUNTER — Encounter: Payer: Self-pay | Admitting: Vascular Surgery

## 2017-07-20 ENCOUNTER — Ambulatory Visit (INDEPENDENT_AMBULATORY_CARE_PROVIDER_SITE_OTHER): Payer: Medicare Other | Admitting: Vascular Surgery

## 2017-07-20 ENCOUNTER — Ambulatory Visit
Admission: RE | Admit: 2017-07-20 | Discharge: 2017-07-20 | Disposition: A | Discharge status: Home / Self Care | Payer: Medicare Other | Source: Ambulatory Visit | Attending: Vascular Surgery | Admitting: Vascular Surgery

## 2017-07-20 VITALS — BP 152/74 | HR 71 | Temp 97.2°F | Resp 22 | Ht 68.0 in | Wt 259.0 lb

## 2017-07-20 DIAGNOSIS — I714 Abdominal aortic aneurysm, without rupture, unspecified: Secondary | ICD-10-CM

## 2017-07-20 DIAGNOSIS — I771 Stricture of artery: Secondary | ICD-10-CM | POA: Diagnosis not present

## 2017-07-20 DIAGNOSIS — I255 Ischemic cardiomyopathy: Secondary | ICD-10-CM

## 2017-07-20 DIAGNOSIS — I739 Peripheral vascular disease, unspecified: Secondary | ICD-10-CM

## 2017-07-20 MED ORDER — IOPAMIDOL (ISOVUE-370) INJECTION 76%
75.0000 mL | Freq: Once | INTRAVENOUS | Status: AC | PRN
  Administered 2017-07-20: 75 mL via INTRAVENOUS

## 2017-07-30 ENCOUNTER — Encounter (HOSPITAL_COMMUNITY): Payer: Self-pay | Admitting: Cardiology

## 2017-07-30 ENCOUNTER — Ambulatory Visit (HOSPITAL_BASED_OUTPATIENT_CLINIC_OR_DEPARTMENT_OTHER)
Admission: RE | Admit: 2017-07-30 | Discharge: 2017-07-30 | Disposition: A | Discharge status: Home / Self Care | Payer: Medicare Other | Source: Ambulatory Visit | Attending: Cardiology | Admitting: Cardiology

## 2017-07-30 ENCOUNTER — Ambulatory Visit (HOSPITAL_COMMUNITY)
Admission: RE | Admit: 2017-07-30 | Discharge: 2017-07-30 | Disposition: A | Discharge status: Home / Self Care | Payer: Medicare Other | Source: Ambulatory Visit | Attending: Family Medicine | Admitting: Family Medicine

## 2017-07-30 VITALS — BP 135/76 | HR 70 | Wt 255.8 lb

## 2017-07-30 DIAGNOSIS — I5042 Chronic combined systolic (congestive) and diastolic (congestive) heart failure: Secondary | ICD-10-CM

## 2017-07-30 DIAGNOSIS — I472 Ventricular tachycardia: Secondary | ICD-10-CM | POA: Diagnosis not present

## 2017-07-30 DIAGNOSIS — I255 Ischemic cardiomyopathy: Secondary | ICD-10-CM | POA: Diagnosis not present

## 2017-07-30 DIAGNOSIS — Z87891 Personal history of nicotine dependence: Secondary | ICD-10-CM | POA: Insufficient documentation

## 2017-07-30 DIAGNOSIS — I110 Hypertensive heart disease with heart failure: Secondary | ICD-10-CM | POA: Diagnosis not present

## 2017-07-30 DIAGNOSIS — E785 Hyperlipidemia, unspecified: Secondary | ICD-10-CM | POA: Diagnosis not present

## 2017-07-30 DIAGNOSIS — I517 Cardiomegaly: Secondary | ICD-10-CM | POA: Insufficient documentation

## 2017-07-30 DIAGNOSIS — Z7982 Long term (current) use of aspirin: Secondary | ICD-10-CM | POA: Diagnosis not present

## 2017-07-30 DIAGNOSIS — K219 Gastro-esophageal reflux disease without esophagitis: Secondary | ICD-10-CM | POA: Insufficient documentation

## 2017-07-30 DIAGNOSIS — I714 Abdominal aortic aneurysm, without rupture: Secondary | ICD-10-CM | POA: Insufficient documentation

## 2017-07-30 DIAGNOSIS — I447 Left bundle-branch block, unspecified: Secondary | ICD-10-CM | POA: Insufficient documentation

## 2017-07-30 DIAGNOSIS — N400 Enlarged prostate without lower urinary tract symptoms: Secondary | ICD-10-CM | POA: Diagnosis not present

## 2017-07-30 DIAGNOSIS — I2510 Atherosclerotic heart disease of native coronary artery without angina pectoris: Secondary | ICD-10-CM | POA: Diagnosis not present

## 2017-07-30 DIAGNOSIS — I739 Peripheral vascular disease, unspecified: Secondary | ICD-10-CM

## 2017-07-30 DIAGNOSIS — I5022 Chronic systolic (congestive) heart failure: Secondary | ICD-10-CM | POA: Diagnosis not present

## 2017-07-30 DIAGNOSIS — I252 Old myocardial infarction: Secondary | ICD-10-CM | POA: Insufficient documentation

## 2017-07-30 DIAGNOSIS — D509 Iron deficiency anemia, unspecified: Secondary | ICD-10-CM | POA: Diagnosis not present

## 2017-07-30 DIAGNOSIS — J441 Chronic obstructive pulmonary disease with (acute) exacerbation: Secondary | ICD-10-CM | POA: Insufficient documentation

## 2017-07-30 DIAGNOSIS — E039 Hypothyroidism, unspecified: Secondary | ICD-10-CM | POA: Insufficient documentation

## 2017-07-30 DIAGNOSIS — J449 Chronic obstructive pulmonary disease, unspecified: Secondary | ICD-10-CM | POA: Diagnosis not present

## 2017-07-30 DIAGNOSIS — Z8679 Personal history of other diseases of the circulatory system: Secondary | ICD-10-CM | POA: Diagnosis not present

## 2017-07-30 DIAGNOSIS — E1151 Type 2 diabetes mellitus with diabetic peripheral angiopathy without gangrene: Secondary | ICD-10-CM | POA: Insufficient documentation

## 2017-07-30 DIAGNOSIS — G4733 Obstructive sleep apnea (adult) (pediatric): Secondary | ICD-10-CM | POA: Insufficient documentation

## 2017-07-30 LAB — BASIC METABOLIC PANEL
Anion gap: 8 (ref 5–15)
BUN: 19 mg/dL (ref 6–20)
CO2: 29 mmol/L (ref 22–32)
Calcium: 9.2 mg/dL (ref 8.9–10.3)
Chloride: 105 mmol/L (ref 101–111)
Creatinine, Ser: 1.04 mg/dL (ref 0.61–1.24)
GFR calc Af Amer: >60 mL/min (ref >60)
GFR calc non Af Amer: >60 mL/min (ref >60)
Glucose, Bld: 121 mg/dL — ABNORMAL HIGH (ref 65–99)
Potassium: 4.2 mmol/L (ref 3.5–5.1)
Sodium: 142 mmol/L (ref 135–145)

## 2017-07-30 MED ORDER — PERFLUTREN LIPID MICROSPHERE
1.0000 mL | INTRAVENOUS | Status: AC | PRN
  Filled 2017-07-30: qty 10

## 2017-07-30 NOTE — Patient Instructions (Signed)
Labs drawn today  Your physician recommends that you schedule a follow-up appointment in: 3 week

## 2017-07-30 NOTE — Progress Notes (Signed)
  Echocardiogram 2D Echocardiogram has been performed.  Donata Clay 07/30/2017, 4:40 PM

## 2017-07-31 NOTE — Progress Notes (Signed)
Patient ID: [name], male   DOB: 07-06-1946, 71 y.o.   MRN: [id number] [facility]  07/31/2017 [name]   1946-04-29  [id number]  Primary Physicia [name], MD Hematologist: Dr Alen Blew  Pulmonary: Dr Annamaria Boots Cardiology: [name]  HPI:  [name] is a pleasant 71 yo with a past medical history significant for morbid obesity, systolic heart failure with an EF of 30-35% (January 2014) -> 20-25% (March 2015 - RV normal), VT on amio, CAD s/p CABG 1996, LBBB, COPD, obstructive sleep apnea on CPAP, AAA repair (January 2015) who was admitted to [facility] on 02/24/14 for acute on chronic HF and acute COPD exacerbation. He also had iron deficiency anemia.   Admitted to [facility] July 24 through July 06 2014 with chest pain.  CEs negative. Had a LHC with no change from previous LHC with recommendations to continue medical management. Discharge weight was 255 pounds.   LHC 07/06/14 --No significant change from previous studies.  Left anterior descending (LAD): The LAD is a large vessel. There is a 40% stenosis immediately after the takeoff of a large septal perforator. There are 2 large diagonal branches without significant disease. Left circumflex (LCx): The LCx is occluded proximally. There are left to left collaterals.  Right coronary artery (RCA): The RCA is occluded proximally immediately following the conus branch. There are right to right and left to right collaterals. SVGs from CABG known to be totally occluded.   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   CTA chest/abd/pelvis (3/16) with moderate emphysema, 1.3 cm nodule RUL, left SFA totally occluded, right SFA with significant stenosis, s/p endovascular AAA repair (stable).   Echo (5/17) with EF 20-25%, severe LV dilation, mild MR, normal RV size with mildly decreased systolic function.   He has a RUL nodule that is most likely lung cancer, he is being treated with radiation.   Echo (8/18) with EF 30%, basal-mid  inferior and inferolateral AK, severe LV dilation, normal RV, mild MR.   At last appointment, weight was up considerably and he was more short of breath.  He admits to going off his low sodium diet and eating more due to depression about lung cancer diagnosis.  I took him off Lasix and started him on torsemide.  Today, weight is down 7 lbs.  He feels much better.  No dyspnea walking on flat ground.  No orthopnea/PND.  No chest pain.  No lightheadedness.    Labs 3/15 Cr 1.5 K 5.6 Labs 07/06/14 K 3.9 Creatinine  0.98 Labs 9/15 K 5.1, creatinine 0.96 Labs 11/15 K 3.8, creatinine 0.83, LDL 61, LFTs normal, TSH normal Labs 11/26/14 K 5.0 Creatinine 0.73  Labs 12/08/14 K 3.4 Creatinine 0.83  Labs 3/16 K 4.4, creatinine 1.03, LFTs normal, TSH normal, HCT 37.8, BNP 65 Labs 6/16 K 4.1, creatinine 0.93, TSH normal, LFTs normal Labs 11/16 K 5.1, creatinine 1.13, LFTs normal, TSH normal, LDL 83, HDL 33, TGs 162 Labs 1/17 K 4.4, creatinine 0.86, pro-BNP 154 Labs 2/17 BNP 113, K 3.8, creatinine 0.79, TSH normal, LFTs normal Labs 4/17 K 3.8, creatinine 0.91, HCT 48.1 Labs 6/17 K 3.5, creatinine 0.98 Labs 9/17 K 3.9, creatinine 0.79, LDL 69, HDL 29, TSH normal, LFTs normal Labs 3/18 K 3.6, creatinine 0.87 Labs 8/18 K 3.3, creatinine 0.78, LFTs normal, TSH normal, LDL 66, HDL 38, BNP 166  ROS: All systems reviewed and negative except as per HPI.   Past Medical History:  Diagnosis Date  . AAA (  abdominal aortic aneurysm) ([place])    followed by Dr. Bridgett Larsson  . Adenomatous colon polyp 01/2004  . Anemia    hx  . Automatic implantable cardioverter-defibrillator in situ   . AVM (arteriovenous malformation)   . BPH (benign prostatic hypertrophy)   . CAD (coronary artery disease)    s/p CABGx2 in 1996  . Carotid artery stenosis    LCEA - Dr. Bridgett Larsson in 2013  . CHF (congestive heart failure) ([place])   . Complication of anesthesia    claustrophobic, unabe to lie on back more than 4 hours at time due to back  . COPD  (chronic obstructive pulmonary disease) ([place])   . Diabetes mellitus    DIET CONTROLLED- pt states that this was a misdiagnosis, he was treated while in the hospital  but returned home, loss a massive amount of weight and he has not had a problem with his blood sugar since. States everything has been normal for about 3 years.  . Diverticulosis   . Dyspnea   . Dysrhythmia    ICD-defibrillator  . Fatigue   . GERD (gastroesophageal reflux disease)   . H/O hiatal hernia   . History of radiation therapy 04/09/17-04/17/17   SBRT right lung 54 Gy in 3 fractions  . HLD (hyperlipidemia)   . Hypertension   . Hypothyroidism   . Ischemic cardiomyopathy   . Myocardial infarction ([place])   . OSA on CPAP    AHI durign total sleep 14.69/hr, during REM 50.91/hr  . Peripheral vascular disease ([facility])    LCEA, L renal artery stent, bilat iliac stents, R SFA stenosis  . Tremor   . Ventricular tachycardia ([place]) 09/09/2014   Amiodarone was started after appropriate defibrillator shocks for ventricular tachycardia in October 2008    Current Outpatient Prescriptions  Medication Sig Dispense Refill  . albuterol (PROVENTIL HFA;VENTOLIN HFA) 108 (90 BASE) MCG/ACT inhaler Inhale 2 puffs into the lungs every 6 (six) hours as needed for wheezing or shortness of breath. 3 Inhaler 4  . albuterol (PROVENTIL) (2.5 MG/3ML) 0.083% nebulizer solution Take 3 mLs (2.5 mg total) by nebulization every 6 (six) hours. 120 mL 3  . amiodarone (PACERONE) 200 MG tablet TAKE 1 TABLET BY MOUTH  DAILY 90 tablet 3  . aspirin 81 MG tablet Take 81 mg by mouth daily.    Marland Kitchen atorvastatin (LIPITOR) 40 MG tablet TAKE 1 TABLET BY MOUTH  DAILY 90 tablet 3  . carvedilol (COREG) 25 MG tablet TAKE 1 TABLET BY MOUTH  TWICE A DAY WITH MEALS 180 tablet 3  . clopidogrel (PLAVIX) 75 MG tablet TAKE 1 TABLET BY MOUTH  DAILY 90 tablet 3  . ENTRESTO 97-103 MG TAKE 1 TABLET BY MOUTH TWICE A DAY 60 tablet 6  . finasteride (PROSCAR) 5 MG tablet Take 5 mg by mouth  daily.    . fluticasone (FLONASE) 50 MCG/ACT nasal spray Instill 1 spray in each  nostril daily 32 g 3  . Fluticasone-Umeclidin-Vilant (TRELEGY ELLIPTA) 100-62.5-25 MCG/INH AEPB Inhale 1 puff into the lungs daily. 30 each 5  . furosemide (LASIX) 80 MG tablet Take 80 mg by mouth 2 (two) times daily.    Marland Kitchen ipratropium (ATROVENT HFA) 17 MCG/ACT inhaler Inhale 2 puffs into the lungs every 4 (four) hours as needed for wheezing. 1 Inhaler 12  . isosorbide mononitrate (IMDUR) 60 MG 24 hr tablet TAKE 1 TABLET BY MOUTH  DAILY 90 tablet 3  . levothyroxine (SYNTHROID, LEVOTHROID) 50 MCG tablet TAKE 1 TABLET BY MOUTH  DAILY BEFORE BREAKFAST 90 tablet 1  . nitroGLYCERIN (NITROSTAT) 0.4 MG SL tablet DISSOLVE ONE TABLET UNDER  THE TONGUE EVERY 5 MINUTES  AS NEEDED FOR CHEST PAIN  (UP TO 3 TABLETS IN 15  MINUTES THEN CALL 911) 75 tablet 0  . NON FORMULARY at bedtime. CPAP And during the day as needed    . potassium chloride SA (K-DUR,KLOR-CON) 20 MEQ tablet Take 1 tablet (20 mEq total) by mouth daily. 30 tablet 3  . spironolactone (ALDACTONE) 25 MG tablet TAKE 1 TABLET BY MOUTH  DAILY 90 tablet 3  . torsemide (DEMADEX) 20 MG tablet Take 4 tablets (80 mg total) by mouth daily. 120 tablet 3   No current facility-administered medications for this encounter.     Allergies  Allergen Reactions  . Tussionex Pennkinetic Er [Hydrocod Polst-Cpm Polst Er] Other (See Comments)    UNSPECIFIED REACTION  > "caused prostate problems"  . Ace Inhibitors Cough    Social History   Social History  . Marital status: Married    Spouse name: [name]  . Number of children: 2  . Years of education: [name]   Occupational History  . retired Solicitor     Social History Main Topics  . Smoking status: Former Smoker    Packs/day: 1.50    Years: 40.00    Types: Cigarettes, Pipe    Quit date: 12/11/2002  . Smokeless tobacco: Never Used  . Alcohol use No  . Drug use: No  . Sexual activity: Not Currently   Other  Topics Concern  . Not on file   Social History Narrative  . No narrative on file    Family History  Problem Relation Age of Onset  . Lung cancer Mother   . Cancer Mother   . Diabetes Mother   . Hypertension Mother   . Hyperlipidemia Mother   . Heart disease Mother        before age 54  . Heart attack Father   . Heart disease Father        before age 63  . Hypertension Father   . Colon cancer Sister   . Cancer Sister   . Hypertension Sister   . Hyperlipidemia Sister   . Diabetes Sister   . Heart disease Sister        before age 39  . Heart disease Brother   . Heart attack Maternal Grandmother   . Colon polyps Sister   . COPD Sister   . Diabetes Daughter    Blood pressure 135/76, pulse 70, weight 255 lb 12.8 oz (116 kg), SpO2 95 %.   Wt Readings from Last 3 Encounters:  07/30/17 255 lb 12.8 oz (116 kg)  07/20/17 259 lb (117.5 kg)  07/16/17 262 lb 8 oz (119.1 kg)    General: NAD General: NAD Neck: Thick, JVP 7 cm, no thyromegaly or thyroid nodule.  Lungs: Clear to auscultation bilaterally with normal respiratory effort. CV: Nondisplaced PMI.  Heart regular S1/S2, no S3/S4, no murmur.  Trace ankle edema.  No carotid bruit.  Unable to palpate pedal pulses.  Abdomen: Soft, nontender, no hepatosplenomegaly, no distention.  Skin: Intact without lesions or rashes.  Neurologic: Alert and oriented x 3.  Psych: Normal affect. Extremities: No clubbing or cyanosis.  HEENT: Normal.   ASSESSMENT/PLAN:  1. Chronic systolic CHF: Ischemic cardiomyopathy. Echo was done today and reviewed, showing EF 30% with severe LV dilation and normal RV.  [employer] CRT-D. NYHA class II symptoms, considerably  improved with weight down.  He looks near-euvolemic on exam.  - Continue torsemide 80 mg daily. BMET today.  - Continue carvedilol 25 mg twice a day  - Continue Entresto 97/103 bid.     - Continue spironolactone 25 mg daily.  - Continue to work on cutting back sodium.   2. CAD  s/p CABG: No chest pain. Continue 81 mg aspirin daily and atorvastatin daily.   - Good lipids in 8/18.   3. VT s/p ICD: On amiodarone for history of ICD discharges due to VT.   - Normal LFTs and TSH in 8/18. Knows he needs yearly eye exams.   4. Morbid obesity: Needs to work on diet/weight loss.  5. COPD: Moderate to severe.  6. Lung cancer: Undergoing radiation.  7. PAD: Occluded left SFA and significant right SFA stenosis on last CTA.  He does not report claudication and does not have pedal ulcers.  Followed by [facility]. He is on statin with optimized lipids.  8. AAA: s/p repair.  Followed at [facility].  No endoleak on last evaluation.   Followup in 3 wks.    [name] 07/31/2017

## 2017-08-01 NOTE — Addendum Note (Signed)
Addended by: Lianne Cure A on: 08/01/2017 11:31 AM   Modules accepted: Orders

## 2017-08-21 ENCOUNTER — Ambulatory Visit (HOSPITAL_COMMUNITY)
Admission: RE | Admit: 2017-08-21 | Discharge: 2017-08-21 | Disposition: A | Discharge status: Home / Self Care | Payer: Medicare Other | Source: Ambulatory Visit | Attending: Cardiology | Admitting: Cardiology

## 2017-08-21 ENCOUNTER — Ambulatory Visit (INDEPENDENT_AMBULATORY_CARE_PROVIDER_SITE_OTHER): Payer: Medicare Other | Admitting: *Deleted

## 2017-08-21 ENCOUNTER — Encounter (HOSPITAL_COMMUNITY): Payer: Self-pay | Admitting: Cardiology

## 2017-08-21 VITALS — BP 152/82 | HR 70 | Wt 251.8 lb

## 2017-08-21 DIAGNOSIS — I5042 Chronic combined systolic (congestive) and diastolic (congestive) heart failure: Secondary | ICD-10-CM | POA: Diagnosis not present

## 2017-08-21 DIAGNOSIS — E039 Hypothyroidism, unspecified: Secondary | ICD-10-CM | POA: Insufficient documentation

## 2017-08-21 DIAGNOSIS — I255 Ischemic cardiomyopathy: Secondary | ICD-10-CM

## 2017-08-21 DIAGNOSIS — I714 Abdominal aortic aneurysm, without rupture: Secondary | ICD-10-CM | POA: Diagnosis not present

## 2017-08-21 DIAGNOSIS — I472 Ventricular tachycardia: Secondary | ICD-10-CM | POA: Diagnosis not present

## 2017-08-21 DIAGNOSIS — K219 Gastro-esophageal reflux disease without esophagitis: Secondary | ICD-10-CM | POA: Insufficient documentation

## 2017-08-21 DIAGNOSIS — J441 Chronic obstructive pulmonary disease with (acute) exacerbation: Secondary | ICD-10-CM | POA: Diagnosis not present

## 2017-08-21 DIAGNOSIS — I739 Peripheral vascular disease, unspecified: Secondary | ICD-10-CM | POA: Diagnosis not present

## 2017-08-21 DIAGNOSIS — I252 Old myocardial infarction: Secondary | ICD-10-CM | POA: Diagnosis not present

## 2017-08-21 DIAGNOSIS — J449 Chronic obstructive pulmonary disease, unspecified: Secondary | ICD-10-CM | POA: Diagnosis not present

## 2017-08-21 DIAGNOSIS — E1151 Type 2 diabetes mellitus with diabetic peripheral angiopathy without gangrene: Secondary | ICD-10-CM | POA: Insufficient documentation

## 2017-08-21 DIAGNOSIS — G4733 Obstructive sleep apnea (adult) (pediatric): Secondary | ICD-10-CM | POA: Insufficient documentation

## 2017-08-21 DIAGNOSIS — N400 Enlarged prostate without lower urinary tract symptoms: Secondary | ICD-10-CM | POA: Insufficient documentation

## 2017-08-21 DIAGNOSIS — R42 Dizziness and giddiness: Secondary | ICD-10-CM | POA: Diagnosis not present

## 2017-08-21 DIAGNOSIS — I447 Left bundle-branch block, unspecified: Secondary | ICD-10-CM | POA: Insufficient documentation

## 2017-08-21 DIAGNOSIS — Z8679 Personal history of other diseases of the circulatory system: Secondary | ICD-10-CM | POA: Insufficient documentation

## 2017-08-21 DIAGNOSIS — Z87891 Personal history of nicotine dependence: Secondary | ICD-10-CM | POA: Insufficient documentation

## 2017-08-21 DIAGNOSIS — I110 Hypertensive heart disease with heart failure: Secondary | ICD-10-CM | POA: Diagnosis not present

## 2017-08-21 DIAGNOSIS — Z8371 Family history of colonic polyps: Secondary | ICD-10-CM | POA: Diagnosis not present

## 2017-08-21 DIAGNOSIS — Z951 Presence of aortocoronary bypass graft: Secondary | ICD-10-CM | POA: Insufficient documentation

## 2017-08-21 DIAGNOSIS — I2510 Atherosclerotic heart disease of native coronary artery without angina pectoris: Secondary | ICD-10-CM | POA: Diagnosis not present

## 2017-08-21 DIAGNOSIS — C3490 Malignant neoplasm of unspecified part of unspecified bronchus or lung: Secondary | ICD-10-CM | POA: Insufficient documentation

## 2017-08-21 DIAGNOSIS — I5022 Chronic systolic (congestive) heart failure: Secondary | ICD-10-CM | POA: Diagnosis not present

## 2017-08-21 DIAGNOSIS — D509 Iron deficiency anemia, unspecified: Secondary | ICD-10-CM | POA: Diagnosis not present

## 2017-08-21 DIAGNOSIS — Z95810 Presence of automatic (implantable) cardiac defibrillator: Secondary | ICD-10-CM | POA: Diagnosis not present

## 2017-08-21 DIAGNOSIS — E785 Hyperlipidemia, unspecified: Secondary | ICD-10-CM

## 2017-08-21 LAB — BASIC METABOLIC PANEL
Anion gap: 5 (ref 5–15)
BUN: 11 mg/dL (ref 6–20)
CO2: 25 mmol/L (ref 22–32)
Calcium: 9.2 mg/dL (ref 8.9–10.3)
Chloride: 109 mmol/L (ref 101–111)
Creatinine, Ser: 0.9 mg/dL (ref 0.61–1.24)
GFR calc Af Amer: >60 mL/min (ref >60)
GFR calc non Af Amer: >60 mL/min (ref >60)
Glucose, Bld: 96 mg/dL (ref 65–99)
Potassium: 4.7 mmol/L (ref 3.5–5.1)
Sodium: 139 mmol/L (ref 135–145)

## 2017-08-21 LAB — BRAIN NATRIURETIC PEPTIDE: B Natriuretic Peptide: 144.3 pg/mL — ABNORMAL HIGH (ref 0.0–100.0)

## 2017-08-21 MED ORDER — AMIODARONE HCL 100 MG PO TABS: 100.0000 mg | ORAL_TABLET | Freq: Every day | ORAL | 3 refills | Status: DC

## 2017-08-21 NOTE — Patient Instructions (Signed)
Decrease Amiodarone 100 mg (1 tab) daily   Labs drawn today  Your physician recommends that you schedule a follow-up appointment in: 2 months

## 2017-08-21 NOTE — Progress Notes (Signed)
Remote ICD transmission.   

## 2017-08-22 ENCOUNTER — Encounter: Payer: Self-pay | Admitting: Cardiology

## 2017-08-22 LAB — CUP PACEART REMOTE DEVICE CHECK
Battery Remaining Longevity: 90 mo
Battery Remaining Percentage: 100 %
Brady Statistic RA Percent Paced: 0 %
Brady Statistic RV Percent Paced: 90 %
Date Time Interrogation Session: 20180911064100
HighPow Impedance: 49 Ohm
Implantable Lead Implant Date: 20060411
Implantable Lead Implant Date: 20151030
Implantable Lead Implant Date: 20151030
Implantable Lead Location: 753858
Implantable Lead Location: 753859
Implantable Lead Location: 753860
Implantable Lead Model: 148
Implantable Lead Model: 4136
Implantable Lead Serial Number: 145070
Implantable Lead Serial Number: 29713415
Implantable Pulse Generator Implant Date: 20151030
Lead Channel Impedance Value: 579 Ohm
Lead Channel Impedance Value: 612 Ohm
Lead Channel Impedance Value: 643 Ohm
Lead Channel Pacing Threshold Amplitude: 0.7 V
Lead Channel Pacing Threshold Amplitude: 0.7 V
Lead Channel Pacing Threshold Amplitude: 1.9 V
Lead Channel Pacing Threshold Pulse Width: 0.4 ms
Lead Channel Pacing Threshold Pulse Width: 0.4 ms
Lead Channel Pacing Threshold Pulse Width: 0.9 ms
Lead Channel Setting Pacing Amplitude: 2 V
Lead Channel Setting Pacing Amplitude: 2 V
Lead Channel Setting Pacing Amplitude: 3.5 V
Lead Channel Setting Pacing Pulse Width: 0.4 ms
Lead Channel Setting Pacing Pulse Width: 0.9 ms
Lead Channel Setting Sensing Sensitivity: 0.6 mV
Lead Channel Setting Sensing Sensitivity: 1 mV
Pulse Gen Serial Number: 370874

## 2017-08-22 NOTE — Progress Notes (Signed)
Patient ID: [name], male   DOB: 1946/05/28, 71 y.o.   MRN: [id number] [facility]  08/22/2017 [name]   [birth year]  [id number]  Primary Physicia Tyrone Lima, MD Hematologist: [name]  Pulmonary: [name] Cardiology: [name]  HPI:  [name] is a pleasant 71 yo with a past medical history significant for morbid obesity, systolic heart failure with an EF of 30-35% (January 2014) -> 20-25% (March 2015 - RV normal), VT on amio, CAD s/p CABG 1996, LBBB, COPD, obstructive sleep apnea on CPAP, AAA repair (January 2015) who was admitted to [facility] on 02/24/14 for acute on chronic HF and acute COPD exacerbation. He also had iron deficiency anemia.   Admitted to [facility] July 24 through July 06 2014 with chest pain.  CEs negative. Had a LHC with no change from previous LHC with recommendations to continue medical management. Discharge weight was 255 pounds.   LHC 07/06/14 --No significant change from previous studies.  Left anterior descending (LAD): The LAD is a large vessel. There is a 40% stenosis immediately after the takeoff of a large septal perforator. There are 2 large diagonal branches without significant disease. Left circumflex (LCx): The LCx is occluded proximally. There are left to left collaterals.  Right coronary artery (RCA): The RCA is occluded proximally immediately following the conus branch. There are right to right and left to right collaterals. SVGs from CABG known to be totally occluded.   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   CTA chest/abd/pelvis (3/16) with moderate emphysema, 1.3 cm nodule RUL, left SFA totally occluded, right SFA with significant stenosis, s/p endovascular AAA repair (stable).   Echo (5/17) with EF 20-25%, severe LV dilation, mild MR, normal RV size with mildly decreased systolic function.   He has a RUL nodule that is most likely lung cancer, he is being treated with radiation.   Echo (8/18) with EF 30%, basal-mid  inferior and inferolateral AK, severe LV dilation, normal RV, mild MR.   He returns today for followup of CHF.  Generally breathing better with increased diuretic.  Weight down 4 lbs.  No dyspnea walking on flat ground.  No orthopnea/PND.  No chest pain.  No palpitations.  He reports that his tremor is getting worse.  He was able to walk into the office today without a wheelchair.  He has had episodes of vertigo, these seem to be positional based turning head to the side or bending over.  This has been going on for a month.   Labs 3/15 Cr 1.5 K 5.6 Labs 07/06/14 K 3.9 Creatinine  0.98 Labs 9/15 K 5.1, creatinine 0.96 Labs 11/15 K 3.8, creatinine 0.83, LDL 61, LFTs normal, TSH normal Labs 11/26/14 K 5.0 Creatinine 0.73 , LFTs normal, TSH normal,  Labs 12/08/14 K 3.4 Creatinine 0.83  Labs 3/16 K 4.4, creatinine 1.03, LFTs normal, TSH normal, HCT 37.8, BNP 65 Labs 6/16 K 4.1, creatinine 0.93, TSH normal, LFTs normal Labs 11/16 K 5.1, creatinine 1.13, LFTs normal, TSH normal, LDL 83, HDL 33, TGs 162 Labs 1/17 K 4.4, creatinine 0.86, pro-BNP 154 Labs 2/17 BNP 113, K 3.8, creatinine 0.79, TSH normal, LFTs normal Labs 4/17 K 3.8, creatinine 0.91, HCT 48.1 Labs 6/17 K 3.5, creatinine 0.98 Labs 9/17 K 3.9, creatinine 0.79, LDL 69, HDL 29, TSH normal, LFTs normal Labs 3/18 K 3.6, creatinine 0.87 Labs 8/18 K 3.3 => 4.2, creatinine 0.78 => 1.04, LDL 66, HDL 38, BNP 166  ROS: All systems  reviewed and negative except as per HPI.   Past Medical History:  Diagnosis Date  . AAA (abdominal aortic aneurysm) ([place])    followed by [name]  . Adenomatous colon polyp 01/2004  . Anemia    hx  . Automatic implantable cardioverter-defibrillator in situ   . AVM (arteriovenous malformation)   . BPH (benign prostatic hypertrophy)   . CAD (coronary artery disease)    s/p CABGx2 in 1996  . Carotid artery stenosis    LCEA - [name] in 2013  . CHF (congestive heart failure) ([place])   . Complication of anesthesia     claustrophobic, unabe to lie on back more than 4 hours at time due to back  . COPD (chronic obstructive pulmonary disease) ([place])   . Diabetes mellitus    DIET CONTROLLED- pt states that this was a misdiagnosis, he was treated while in the hospital  but returned home, loss a massive amount of weight and he has not had a problem with his blood sugar since. States everything has been normal for about 3 years.  . Diverticulosis   . Dyspnea   . Dysrhythmia    ICD-defibrillator  . Fatigue   . GERD (gastroesophageal reflux disease)   . H/O hiatal hernia   . History of radiation therapy 04/09/17-04/17/17   SBRT right lung 54 Gy in 3 fractions  . HLD (hyperlipidemia)   . Hypertension   . Hypothyroidism   . Ischemic cardiomyopathy   . Myocardial infarction ([place])   . OSA on CPAP    AHI durign total sleep 14.69/hr, during REM 50.91/hr  . Peripheral vascular disease ([facility])    LCEA, L renal artery stent, bilat iliac stents, R SFA stenosis  . Tremor   . Ventricular tachycardia ([place]) 09/09/2014   Amiodarone was started after appropriate defibrillator shocks for ventricular tachycardia in October 2008    Current Outpatient Prescriptions  Medication Sig Dispense Refill  . albuterol (PROVENTIL HFA;VENTOLIN HFA) 108 (90 BASE) MCG/ACT inhaler Inhale 2 puffs into the lungs every 6 (six) hours as needed for wheezing or shortness of breath. 3 Inhaler 4  . albuterol (PROVENTIL) (2.5 MG/3ML) 0.083% nebulizer solution Take 3 mLs (2.5 mg total) by nebulization every 6 (six) hours. 120 mL 3  . amiodarone (PACERONE) 100 MG tablet Take 1 tablet (100 mg total) by mouth daily. 30 tablet 3  . aspirin 81 MG tablet Take 81 mg by mouth daily.    Marland Kitchen atorvastatin (LIPITOR) 40 MG tablet TAKE 1 TABLET BY MOUTH  DAILY 90 tablet 3  . carvedilol (COREG) 25 MG tablet TAKE 1 TABLET BY MOUTH  TWICE A DAY WITH MEALS 180 tablet 3  . clopidogrel (PLAVIX) 75 MG tablet TAKE 1 TABLET BY MOUTH  DAILY 90 tablet 3  . ENTRESTO 97-103 MG  TAKE 1 TABLET BY MOUTH TWICE A DAY 60 tablet 6  . finasteride (PROSCAR) 5 MG tablet Take 5 mg by mouth daily.    . fluticasone (FLONASE) 50 MCG/ACT nasal spray Instill 1 spray in each  nostril daily 32 g 3  . Fluticasone-Umeclidin-Vilant (TRELEGY ELLIPTA) 100-62.5-25 MCG/INH AEPB Inhale 1 puff into the lungs daily. 30 each 5  . ipratropium (ATROVENT HFA) 17 MCG/ACT inhaler Inhale 2 puffs into the lungs every 4 (four) hours as needed for wheezing. 1 Inhaler 12  . isosorbide mononitrate (IMDUR) 60 MG 24 hr tablet TAKE 1 TABLET BY MOUTH  DAILY 90 tablet 3  . levothyroxine (SYNTHROID, LEVOTHROID) 50 MCG tablet TAKE 1 TABLET BY  MOUTH  DAILY BEFORE BREAKFAST 90 tablet 1  . nitroGLYCERIN (NITROSTAT) 0.4 MG SL tablet DISSOLVE ONE TABLET UNDER  THE TONGUE EVERY 5 MINUTES  AS NEEDED FOR CHEST PAIN  (UP TO 3 TABLETS IN 15  MINUTES THEN CALL 911) 75 tablet 0  . NON FORMULARY at bedtime. CPAP And during the day as needed    . potassium chloride SA (K-DUR,KLOR-CON) 20 MEQ tablet Take 1 tablet (20 mEq total) by mouth daily. 30 tablet 3  . spironolactone (ALDACTONE) 25 MG tablet TAKE 1 TABLET BY MOUTH  DAILY 90 tablet 3  . torsemide (DEMADEX) 20 MG tablet Take 4 tablets (80 mg total) by mouth daily. 120 tablet 3   No current facility-administered medications for this encounter.     Allergies  Allergen Reactions  . Tussionex Pennkinetic Er [Hydrocod Polst-Cpm Polst Er] Other (See Comments)    UNSPECIFIED REACTION  > "caused prostate problems"  . Ace Inhibitors Cough    Social History   Social History  . Marital status: Married    Spouse name: [name]  . Number of children: 2  . Years of education: [name]   Occupational History  . retired Solicitor     Social History Main Topics  . Smoking status: Former Smoker    Packs/day: 1.50    Years: 40.00    Types: Cigarettes, Pipe    Quit date: 12/11/2002  . Smokeless tobacco: Never Used  . Alcohol use No  . Drug use: No  . Sexual activity:  Not Currently   Other Topics Concern  . Not on file   Social History Narrative  . No narrative on file    Family History  Problem Relation Age of Onset  . Lung cancer Mother   . Cancer Mother   . Diabetes Mother   . Hypertension Mother   . Hyperlipidemia Mother   . Heart disease Mother        before age 38  . Heart attack Father   . Heart disease Father        before age 72  . Hypertension Father   . Colon cancer Sister   . Cancer Sister   . Hypertension Sister   . Hyperlipidemia Sister   . Diabetes Sister   . Heart disease Sister        before age 62  . Heart disease Brother   . Heart attack Maternal Grandmother   . Colon polyps Sister   . COPD Sister   . Diabetes Daughter    Blood pressure (!) 152/82, pulse 70, weight 251 lb 12.8 oz (114.2 kg), SpO2 97 %.   Wt Readings from Last 3 Encounters:  08/21/17 251 lb 12.8 oz (114.2 kg)  07/30/17 255 lb 12.8 oz (116 kg)  07/20/17 259 lb (117.5 kg)    General: NAD Neck: No JVD, no thyromegaly or thyroid nodule.  Lungs: Clear to auscultation bilaterally with normal respiratory effort. CV: Nondisplaced PMI.  Heart regular S1/S2, no S3/S4, no murmur.  No peripheral edema.  No carotid bruit.  Normal pedal pulses.  Abdomen: Soft, nontender, no hepatosplenomegaly, no distention.  Skin: Intact without lesions or rashes.  Neurologic: Alert and oriented x 3.  Psych: Normal affect. Extremities: No clubbing or cyanosis.  HEENT: Normal.   ASSESSMENT/PLAN:  1. Chronic systolic CHF: Ischemic cardiomyopathy. Echo was done today and reviewed, showing EF 30% with severe LV dilation and normal RV.  [place] CRT-D. He is doing better than before.  Weight  down another 4 lbs.  He does not look volume overloaded on exam.   - Continue torsemide 80 mg daily. BMET/BNP today.   - Continue carvedilol 25 mg twice a day  - Continue Entresto 97/103 bid.     - Continue spironolactone 25 mg daily.  - Continue to work on cutting back  sodium.   2. CAD s/p CABG: No chest pain. Continue 81 mg aspirin daily and atorvastatin daily.   - Good lipids in 8/18.   3. VT s/p ICD: On amiodarone for history of ICD discharges due to VT.   - Normal LFTs and TSH in 8/18. Knows he needs yearly eye exams.   - Given increased tremor, may try cutting back on amiodarone to 100 mg daily.  4. Morbid obesity: Needs to work on diet/weight loss.  5. COPD: Moderate to severe.  6. Lung cancer: Has had radiation.  7. PAD: Occluded left SFA and significant right SFA stenosis on last CTA.  He does not report claudication and does not have pedal ulcers.  Followed by [facility]. He is on statin with optimized lipids.  8. AAA: s/p repair.  Followed at [facility].  No endoleak on last evaluation.  9. Vertigo: Sounds like BPPV, do not think this is orthostasis.  I offered him an appointment for vestibular rehab, he wants to try meclizine and will call us if he wants the appointment.   Followup in 2 months.  [name] 08/22/2017

## 2017-09-03 ENCOUNTER — Other Ambulatory Visit (HOSPITAL_COMMUNITY): Payer: Self-pay | Admitting: Cardiology

## 2017-09-05 ENCOUNTER — Other Ambulatory Visit (HOSPITAL_COMMUNITY): Payer: Self-pay | Admitting: Cardiology

## 2017-09-05 ENCOUNTER — Encounter: Payer: Self-pay | Admitting: Cardiology

## 2017-09-05 MED ORDER — TORSEMIDE 20 MG PO TABS: 80.0000 mg | ORAL_TABLET | Freq: Every day | ORAL | 3 refills | Status: DC

## 2017-09-11 ENCOUNTER — Other Ambulatory Visit (HOSPITAL_COMMUNITY): Payer: Self-pay | Admitting: Cardiology

## 2017-09-11 MED ORDER — POTASSIUM CHLORIDE CRYS ER 20 MEQ PO TBCR: 20.0000 meq | EXTENDED_RELEASE_TABLET | Freq: Every day | ORAL | 3 refills | Status: DC

## 2017-09-26 ENCOUNTER — Ambulatory Visit (HOSPITAL_COMMUNITY)
Admission: RE | Admit: 2017-09-26 | Discharge: 2017-09-26 | Disposition: A | Discharge status: Home / Self Care | Payer: Medicare Other | Source: Ambulatory Visit | Attending: Radiation Oncology | Admitting: Radiation Oncology

## 2017-09-26 DIAGNOSIS — J439 Emphysema, unspecified: Secondary | ICD-10-CM | POA: Diagnosis not present

## 2017-09-26 DIAGNOSIS — I700 Atherosclerosis of aorta: Secondary | ICD-10-CM | POA: Insufficient documentation

## 2017-09-26 DIAGNOSIS — R911 Solitary pulmonary nodule: Secondary | ICD-10-CM | POA: Diagnosis not present

## 2017-09-26 DIAGNOSIS — R933 Abnormal findings on diagnostic imaging of other parts of digestive tract: Secondary | ICD-10-CM | POA: Insufficient documentation

## 2017-10-02 DIAGNOSIS — Z23 Encounter for immunization: Secondary | ICD-10-CM | POA: Diagnosis not present

## 2017-10-09 ENCOUNTER — Telehealth: Payer: Self-pay | Admitting: Oncology

## 2017-10-09 NOTE — Telephone Encounter (Signed)
Called patient to schedule follow up appointment to discuss CT scan results.  His wife said that they already have an appointment scheduled for 11/1 at 9:15.

## 2017-10-11 ENCOUNTER — Encounter: Payer: Self-pay | Admitting: Radiation Oncology

## 2017-10-11 ENCOUNTER — Ambulatory Visit
Admission: RE | Admit: 2017-10-11 | Discharge: 2017-10-11 | Disposition: A | Discharge status: Home / Self Care | Payer: Medicare Other | Source: Ambulatory Visit | Attending: Radiation Oncology | Admitting: Radiation Oncology

## 2017-10-11 DIAGNOSIS — Z7902 Long term (current) use of antithrombotics/antiplatelets: Secondary | ICD-10-CM | POA: Diagnosis not present

## 2017-10-11 DIAGNOSIS — Z79899 Other long term (current) drug therapy: Secondary | ICD-10-CM | POA: Diagnosis not present

## 2017-10-11 DIAGNOSIS — Z888 Allergy status to other drugs, medicaments and biological substances status: Secondary | ICD-10-CM | POA: Insufficient documentation

## 2017-10-11 DIAGNOSIS — R911 Solitary pulmonary nodule: Secondary | ICD-10-CM | POA: Diagnosis not present

## 2017-10-11 DIAGNOSIS — R918 Other nonspecific abnormal finding of lung field: Secondary | ICD-10-CM | POA: Diagnosis not present

## 2017-10-11 DIAGNOSIS — Z8709 Personal history of other diseases of the respiratory system: Secondary | ICD-10-CM | POA: Diagnosis not present

## 2017-10-11 DIAGNOSIS — Z08 Encounter for follow-up examination after completed treatment for malignant neoplasm: Secondary | ICD-10-CM | POA: Diagnosis not present

## 2017-10-11 DIAGNOSIS — Z7982 Long term (current) use of aspirin: Secondary | ICD-10-CM | POA: Insufficient documentation

## 2017-10-11 NOTE — Progress Notes (Signed)
[name] is here for follow up after treatment to his right lung.  He denies having any pain or cough.  He reports shortness of breath with any activity.  He does use a CPAP when sleeping.  He reports having fatigue.  BP (!) 102/47 (BP Location: Left Arm, Patient Position: Sitting)   Pulse 68   Temp 98 F (36.7 C) (Oral)   Ht 5\' 8"  (1.727 m)   Wt 262 lb 9.6 oz (119.1 kg)   SpO2 97%   BMI 39.93 kg/m    Wt Readings from Last 3 Encounters:  10/11/17 262 lb 9.6 oz (119.1 kg)  08/21/17 251 lb 12.8 oz (114.2 kg)  07/30/17 255 lb 12.8 oz (116 kg)

## 2017-10-11 NOTE — Progress Notes (Signed)
Radiation Oncology         [phone] ________________________________  Name: [name] MRN: [id number]  Date: 10/11/2017  DOB: 12-09-1946  Follow-Up Visit Note  CC: [name], MD  [name], MD    ICD-10-CM   1. Nodule of right lung R91.1     Diagnosis: PET positive 1.2 cm right upper lungnodule    Interval Since Last Radiation: 6 months 04/09/17-04/17/17: 54 Gy to the right lung in 3 fractions  Narrative:  The patient returns today for routine follow-up. He presents with his wife. He is doing well overall. He states "It feels as if I didn't have treatment, I feel great." He notes that he is without major side effects from treatment at this time. He denies chest pain, SOB, or hemoptysis.   On 09/26/2017 the patient underwent CT of the chest without contrast that revealed: IMPRESSION:  Response to therapy of right upper lobe lung nodule, interval enlargement of central right upper and posterior left upper lobe pulmonary nodules, suspicious of either synchronous primary bronchogenic carcinomas or pulmonary metastasis, emphysema, aortic atherosclerosis, esophageal air fluid levels suggests dysmotility or gastroesophageal reflux.      I personally reviewed the CT scan and the area of treatment has shrunken, however based on the CT scan, I am unsure about some small areas in the right and left lungs, therefore I will schedule a scan. CT scan will be scheduled  prior to his next follow up visit.                           ALLERGIES:  is allergic to tussionex pennkinetic er [hydrocod polst-cpm polst er] and ace inhibitors.  Meds: Current Outpatient Prescriptions  Medication Sig Dispense Refill  . albuterol (PROVENTIL HFA;VENTOLIN HFA) 108 (90 BASE) MCG/ACT inhaler Inhale 2 puffs into the lungs every 6 (six) hours as needed for wheezing or shortness of breath. 3 Inhaler 4  . albuterol (PROVENTIL) (2.5 MG/3ML) 0.083% nebulizer solution Take 3 mLs (2.5 mg total) by  nebulization every 6 (six) hours. 120 mL 3  . amiodarone (PACERONE) 100 MG tablet Take 1 tablet (100 mg total) by mouth daily. 30 tablet 3  . aspirin 81 MG tablet Take 81 mg by mouth daily.    Marland Kitchen atorvastatin (LIPITOR) 40 MG tablet TAKE 1 TABLET BY MOUTH  DAILY 90 tablet 3  . carvedilol (COREG) 25 MG tablet TAKE 1 TABLET BY MOUTH  TWICE A DAY WITH MEALS 180 tablet 3  . clopidogrel (PLAVIX) 75 MG tablet TAKE 1 TABLET BY MOUTH  DAILY 90 tablet 3  . ENTRESTO 97-103 MG TAKE 1 TABLET BY MOUTH TWICE A DAY 60 tablet 6  . finasteride (PROSCAR) 5 MG tablet Take 5 mg by mouth daily.    . fluticasone (FLONASE) 50 MCG/ACT nasal spray Instill 1 spray in each  nostril daily 32 g 3  . Fluticasone-Umeclidin-Vilant (TRELEGY ELLIPTA) 100-62.5-25 MCG/INH AEPB Inhale 1 puff into the lungs daily. 30 each 5  . ipratropium (ATROVENT HFA) 17 MCG/ACT inhaler Inhale 2 puffs into the lungs every 4 (four) hours as needed for wheezing. 1 Inhaler 12  . isosorbide mononitrate (IMDUR) 60 MG 24 hr tablet TAKE 1 TABLET BY MOUTH  DAILY 90 tablet 3  . levothyroxine (SYNTHROID, LEVOTHROID) 50 MCG tablet TAKE 1 TABLET BY MOUTH  DAILY BEFORE BREAKFAST 90 tablet 1  . nitroGLYCERIN (NITROSTAT) 0.4 MG SL tablet DISSOLVE ONE TABLET UNDER  THE TONGUE EVERY  5 MINUTES  AS NEEDED FOR CHEST PAIN  (UP TO 3 TABLETS IN 15  MINUTES THEN CALL 911) 75 tablet 0  . NON FORMULARY at bedtime. CPAP And during the day as needed    . potassium chloride SA (K-DUR,KLOR-CON) 20 MEQ tablet Take 1 tablet (20 mEq total) by mouth daily. 90 tablet 3  . spironolactone (ALDACTONE) 25 MG tablet TAKE 1 TABLET BY MOUTH  DAILY 90 tablet 3  . torsemide (DEMADEX) 20 MG tablet Take 4 tablets (80 mg total) by mouth daily. 360 tablet 3   No current facility-administered medications for this encounter.     Physical Findings: The patient is in no acute distress. Patient is alert and oriented.  height is 5\' 8"  (1.727 m) and weight is 262 lb 9.6 oz (119.1 kg). His oral  temperature is 98 F (36.7 C). His blood pressure is 102/47 (abnormal) and his pulse is 68. His oxygen saturation is 97%. .  No significant changes. Lungs are clear to auscultation bilaterally. Heart has regular rate and rhythm. No palpable cervical, supraclavicular, or axillary adenopathy.  No lymph nodes were found.  Lab Findings: Lab Results  Component Value Date   WBC 7.0 02/20/2017   HGB 13.6 02/20/2017   HCT 40.6 02/20/2017   MCV 93.8 02/20/2017   PLT 237.0 02/20/2017    Radiographic Findings: Ct Chest Wo Contrast  Result Date: 09/26/2017 CLINICAL DATA:  Right lower lobe pulmonary nodule wall, status post SBRT. EXAM: CT CHEST WITHOUT CONTRAST TECHNIQUE: Multidetector CT imaging of the chest was performed following the standard protocol without IV contrast. COMPARISON:  Chest radiograph 02/21/2017.  Chest CT 02/12/2017. FINDINGS: Cardiovascular: Aortic and branch vessel atherosclerosis. Mild cardiomegaly. Prior median sternotomy for CABG. Pacer/AICD device. Mediastinum/Nodes: No supraclavicular adenopathy. No mediastinal or definite hilar adenopathy, given limitations of unenhanced CT. Fluid level in the esophagus including on image 100/series 2. Lungs/Pleura: No pleural fluid. Mild anterior right pleural thickening. Moderate centrilobular emphysema. Radiation seed implants in the right upper lobe with architectural distortion and mild surrounding consolidation. Residual nodule measures 1.0 cm on image 38/ series 7 versus 1.2 cm on the prior exam (when remeasured). A more central right upper lobe nodule measures 10 mm on image 46/series 7 and is felt to have enlarged compared to prior exams. New since the remote study of 03/19/2015. Calcified tiny right lower lobe granulomas. Minimal nodularity in the superior segment right lower lobe at 4 mm on image 44/series 7. Similar. Posterior left upper lobe 7 mm nodule on image 34/series 7 is increased from 5 mm on 02/12/2017. Upper Abdomen: Normal  imaged portions of the liver, spleen, stomach, pancreas, adrenal glands, kidneys. Bilateral renal vascular calcifications. Incompletely imaged aortic stent graft repair. Musculoskeletal: Mild thoracic spondylosis. IMPRESSION: 1. Response to therapy of right upper lobe lung nodule. 2. Interval enlargement of central right upper and posterior left upper lobe pulmonary nodules, suspicious for either synchronous primary bronchogenic carcinomas or pulmonary metastasis. 3.  Emphysema (ICD10-J43.9). 4.  Aortic Atherosclerosis (ICD10-I70.0). 5. Esophageal air fluid level suggests dysmotility or gastroesophageal reflux. Electronically Signed   By: Abigail Miyamoto M.D.   On: 09/26/2017 10:06    Impression: Clinically stable, status post SBRT. No evidence of reccurrence on clinical exam. Pt recent chest CT scan was reviewed and showed right upper lesion was treated with SBRT, and CT scan also revealed two small pulmonary nodules. The patient will return in 3 months following chest CT scan.   Plan:  Schedule chest CT scan in three  months. Follow-up after scan.  ____________________________________    This document serves as a record of services personally performed by Gery Pray, MD. It was created on his behalf by Valeta Harms, a trained medical scribe. The creation of this record is based on the scribe's personal observations and the provider's statements to them. This document has been checked and approved by the attending provider.

## 2017-10-22 ENCOUNTER — Other Ambulatory Visit: Payer: Self-pay | Admitting: Internal Medicine

## 2017-10-22 ENCOUNTER — Ambulatory Visit (HOSPITAL_COMMUNITY)
Admission: RE | Admit: 2017-10-22 | Discharge: 2017-10-22 | Disposition: A | Discharge status: Home / Self Care | Payer: Medicare Other | Source: Ambulatory Visit | Attending: Cardiology | Admitting: Cardiology

## 2017-10-22 ENCOUNTER — Encounter (HOSPITAL_COMMUNITY): Payer: Self-pay | Admitting: Cardiology

## 2017-10-22 ENCOUNTER — Other Ambulatory Visit: Payer: Self-pay | Admitting: Cardiovascular Disease

## 2017-10-22 ENCOUNTER — Other Ambulatory Visit (HOSPITAL_COMMUNITY): Payer: Self-pay | Admitting: Cardiology

## 2017-10-22 VITALS — BP 130/64 | HR 75 | Wt 261.8 lb

## 2017-10-22 DIAGNOSIS — I5042 Chronic combined systolic (congestive) and diastolic (congestive) heart failure: Secondary | ICD-10-CM | POA: Insufficient documentation

## 2017-10-22 DIAGNOSIS — I739 Peripheral vascular disease, unspecified: Secondary | ICD-10-CM

## 2017-10-22 DIAGNOSIS — I2510 Atherosclerotic heart disease of native coronary artery without angina pectoris: Secondary | ICD-10-CM | POA: Diagnosis not present

## 2017-10-22 LAB — COMPREHENSIVE METABOLIC PANEL
ALT: 19 U/L (ref 17–63)
AST: 16 U/L (ref 15–41)
Albumin: 3.7 g/dL (ref 3.5–5.0)
Alkaline Phosphatase: 82 U/L (ref 38–126)
Anion gap: 6 (ref 5–15)
BUN: 32 mg/dL — ABNORMAL HIGH (ref 6–20)
CO2: 29 mmol/L (ref 22–32)
Calcium: 9.4 mg/dL (ref 8.9–10.3)
Chloride: 104 mmol/L (ref 101–111)
Creatinine, Ser: 1.17 mg/dL (ref 0.61–1.24)
GFR calc Af Amer: >60 mL/min (ref >60)
GFR calc non Af Amer: >60 mL/min (ref >60)
Glucose, Bld: 120 mg/dL — ABNORMAL HIGH (ref 65–99)
Potassium: 4.6 mmol/L (ref 3.5–5.1)
Sodium: 139 mmol/L (ref 135–145)
Total Bilirubin: 0.9 mg/dL (ref 0.3–1.2)
Total Protein: 6.7 g/dL (ref 6.5–8.1)

## 2017-10-22 LAB — TSH: TSH: 2.854 u[IU]/mL (ref 0.350–4.500)

## 2017-10-22 MED ORDER — TORSEMIDE 20 MG PO TABS: ORAL_TABLET | ORAL | 3 refills | Status: DC

## 2017-10-22 NOTE — Patient Instructions (Signed)
Take 80mg  of Torsemide every morning. Take 40mg  of Torsemide every other evening.  Routine lab work today. Will notify you of abnormal results  Repeat labs (bmet) in 10 days.  Follow up with Dr.McLean in 6 weeks.

## 2017-10-22 NOTE — Telephone Encounter (Signed)
REFILL 

## 2017-10-23 NOTE — Progress Notes (Signed)
Patient ID: [name], male   DOB: 1946/03/29, 71 y.o.   MRN: [id number] [facility]  10/23/2017 JI FELDNER   20-Jun-1946  [id number]  Primary Physicia [name], MD Hematologist: Dr Alen Blew  Pulmonary: Dr Annamaria Boots Cardiology: [name]  HPI:  [name] is a pleasant 71 yo with a past medical history significant for morbid obesity, systolic heart failure with an EF of 30-35% (January 2014) -> 20-25% (March 2015 - RV normal), VT on amio, CAD s/p CABG 1996, LBBB, COPD, obstructive sleep apnea on CPAP, AAA repair (January 2015) who was admitted to [facility] on 02/24/14 for acute on chronic HF and acute COPD exacerbation. He also had iron deficiency anemia.   Admitted to [facility] July 24 through July 06 2014 with chest pain.  CEs negative. Had a LHC with no change from previous LHC with recommendations to continue medical management. Discharge weight was 255 pounds.   LHC 07/06/14 --No significant change from previous studies.  Left anterior descending (LAD): The LAD is a large vessel. There is a 40% stenosis immediately after the takeoff of a large septal perforator. There are 2 large diagonal branches without significant disease. Left circumflex (LCx): The LCx is occluded proximally. There are left to left collaterals.  Right coronary artery (RCA): The RCA is occluded proximally immediately following the conus branch. There are right to right and left to right collaterals. SVGs from CABG known to be totally occluded.   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   CTA chest/abd/pelvis (3/16) with moderate emphysema, 1.3 cm nodule RUL, left SFA totally occluded, right SFA with significant stenosis, s/p endovascular AAA repair (stable).   Echo (5/17) with EF 20-25%, severe LV dilation, mild MR, normal RV size with mildly decreased systolic function.   He has a RUL nodule that is most likely lung cancer, he is being treated with radiation.   Echo (8/18) with EF 30%, basal-mid  inferior and inferolateral AK, severe LV dilation, normal RV, mild MR.   He returns today for followup of CHF.  Weight is up 10 lbs.  He was down at the beach recently and ate out a lot.  Not much edema.  Short of breath with stairs and with dressing. No dyspnea on flat ground.  No chest pain.  Sometimes sleeps sitting up due to orthopnea.    Labs 3/15 Cr 1.5 K 5.6 Labs 07/06/14 K 3.9 Creatinine  0.98 Labs 9/15 K 5.1, creatinine 0.96 Labs 11/15 K 3.8, creatinine 0.83, LDL 61, LFTs normal, TSH normal Labs 11/26/14 K 5.0 Creatinine 0.73 , LFTs normal, TSH normal,  Labs 12/08/14 K 3.4 Creatinine 0.83  Labs 3/16 K 4.4, creatinine 1.03, LFTs normal, TSH normal, HCT 37.8, BNP 65 Labs 6/16 K 4.1, creatinine 0.93, TSH normal, LFTs normal Labs 11/16 K 5.1, creatinine 1.13, LFTs normal, TSH normal, LDL 83, HDL 33, TGs 162 Labs 1/17 K 4.4, creatinine 0.86, pro-BNP 154 Labs 2/17 BNP 113, K 3.8, creatinine 0.79, TSH normal, LFTs normal Labs 4/17 K 3.8, creatinine 0.91, HCT 48.1 Labs 6/17 K 3.5, creatinine 0.98 Labs 9/17 K 3.9, creatinine 0.79, LDL 69, HDL 29, TSH normal, LFTs normal Labs 3/18 K 3.6, creatinine 0.87 Labs 8/18 K 3.3 => 4.2, creatinine 0.78 => 1.04, LDL 66, HDL 38, BNP 166 Labs 9/18: K 4.7, creatinine 0.9, BNP 144  ROS: All systems reviewed and negative except as per HPI.   Past Medical History:  Diagnosis Date  . AAA (abdominal aortic aneurysm) ([place])  followed by Dr. Bridgett Larsson  . Adenomatous colon polyp 01/2004  . Anemia    hx  . Automatic implantable cardioverter-defibrillator in situ   . AVM (arteriovenous malformation)   . BPH (benign prostatic hypertrophy)   . CAD (coronary artery disease)    s/p CABGx2 in 1996  . Carotid artery stenosis    LCEA - Dr. Bridgett Larsson in 2013  . CHF (congestive heart failure) ([place])   . Complication of anesthesia    claustrophobic, unabe to lie on back more than 4 hours at time due to back  . COPD (chronic obstructive pulmonary disease) ([place])   .  Diabetes mellitus    DIET CONTROLLED- pt states that this was a misdiagnosis, he was treated while in the hospital  but returned home, loss a massive amount of weight and he has not had a problem with his blood sugar since. States everything has been normal for about 3 years.  . Diverticulosis   . Dyspnea   . Dysrhythmia    ICD-defibrillator  . Fatigue   . GERD (gastroesophageal reflux disease)   . H/O hiatal hernia   . History of radiation therapy 04/09/17-04/17/17   SBRT right lung 54 Gy in 3 fractions  . HLD (hyperlipidemia)   . Hypertension   . Hypothyroidism   . Ischemic cardiomyopathy   . Myocardial infarction ([place])   . OSA on CPAP    AHI durign total sleep 14.69/hr, during REM 50.91/hr  . Peripheral vascular disease (HCC)    LCEA, L renal artery stent, bilat iliac stents, R SFA stenosis  . Tremor   . Ventricular tachycardia ([place]) 09/09/2014   Amiodarone was started after appropriate defibrillator shocks for ventricular tachycardia in October 2008    Current Outpatient Medications  Medication Sig Dispense Refill  . albuterol (PROVENTIL HFA;VENTOLIN HFA) 108 (90 BASE) MCG/ACT inhaler Inhale 2 puffs into the lungs every 6 (six) hours as needed for wheezing or shortness of breath. 3 Inhaler 4  . albuterol (PROVENTIL) (2.5 MG/3ML) 0.083% nebulizer solution Take 3 mLs (2.5 mg total) by nebulization every 6 (six) hours. 120 mL 3  . amiodarone (PACERONE) 100 MG tablet Take 1 tablet (100 mg total) by mouth daily. 30 tablet 3  . aspirin 81 MG tablet Take 81 mg by mouth daily.    Marland Kitchen ENTRESTO 97-103 MG TAKE 1 TABLET BY MOUTH TWICE A DAY 60 tablet 6  . finasteride (PROSCAR) 5 MG tablet Take 5 mg by mouth daily.    . fluticasone (FLONASE) 50 MCG/ACT nasal spray Instill 1 spray in each  nostril daily 32 g 3  . Fluticasone-Umeclidin-Vilant (TRELEGY ELLIPTA) 100-62.5-25 MCG/INH AEPB Inhale 1 puff into the lungs daily. 30 each 5  . ipratropium (ATROVENT HFA) 17 MCG/ACT inhaler Inhale 2 puffs into  the lungs every 4 (four) hours as needed for wheezing. 1 Inhaler 12  . isosorbide mononitrate (IMDUR) 60 MG 24 hr tablet Take one (1) tablet (60 mg) by mouth daily. 90 tablet 0  . levothyroxine (SYNTHROID, LEVOTHROID) 50 MCG tablet TAKE 1 TABLET BY MOUTH  DAILY BEFORE BREAKFAST 90 tablet 0  . nitroGLYCERIN (NITROSTAT) 0.4 MG SL tablet DISSOLVE ONE TABLET UNDER  THE TONGUE EVERY 5 MINUTES  AS NEEDED FOR CHEST PAIN  (UP TO 3 TABLETS IN 15  MINUTES THEN CALL 911) 75 tablet 0  . NON FORMULARY at bedtime. CPAP And during the day as needed    . potassium chloride SA (K-DUR,KLOR-CON) 20 MEQ tablet Take 1 tablet (20 mEq total) by  mouth daily. 90 tablet 3  . spironolactone (ALDACTONE) 25 MG tablet TAKE 1 TABLET BY MOUTH  DAILY 90 tablet 3  . torsemide (DEMADEX) 20 MG tablet Take 80mg  every morning. Take 40mg  every other evening. 180 tablet 3  . amiodarone (PACERONE) 200 MG tablet TAKE 1 TABLET BY MOUTH  DAILY 90 tablet 3  . atorvastatin (LIPITOR) 40 MG tablet TAKE 1 TABLET BY MOUTH  DAILY 90 tablet 3  . carvedilol (COREG) 25 MG tablet TAKE 1 TABLET BY MOUTH  TWICE A DAY WITH MEALS 180 tablet 3  . clopidogrel (PLAVIX) 75 MG tablet TAKE 1 TABLET BY MOUTH  DAILY 90 tablet 3   No current facility-administered medications for this encounter.     Allergies  Allergen Reactions  . Tussionex Pennkinetic Er [Hydrocod Polst-Cpm Polst Er] Other (See Comments)    UNSPECIFIED REACTION  > "caused prostate problems"  . Ace Inhibitors Cough    Social History   Socioeconomic History  . Marital status: Married    Spouse name: Not on file  . Number of children: 2  . Years of education: Not on file  . Highest education level: Not on file  Social Needs  . Financial resource strain: Not on file  . Food insecurity - worry: Not on file  . Food insecurity - inability: Not on file  . Transportation needs - medical: Not on file  . Transportation needs - non-medical: Not on file  Occupational History  . Occupation:  retired Solicitor   Tobacco Use  . Smoking status: Former Smoker    Packs/day: 1.50    Years: 40.00    Pack years: 60.00    Types: Cigarettes, Pipe    Last attempt to quit: 12/11/2002    Years since quitting: 14.8  . Smokeless tobacco: Never Used  Substance and Sexual Activity  . Alcohol use: No    Alcohol/week: 0.0 oz  . Drug use: No  . Sexual activity: Not Currently  Other Topics Concern  . Not on file  Social History Narrative  . Not on file    Family History  Problem Relation Age of Onset  . Lung cancer Mother   . Cancer Mother   . Diabetes Mother   . Hypertension Mother   . Hyperlipidemia Mother   . Heart disease Mother        before age 40  . Heart attack Father   . Heart disease Father        before age 52  . Hypertension Father   . Colon cancer Sister   . Cancer Sister   . Hypertension Sister   . Hyperlipidemia Sister   . Diabetes Sister   . Heart disease Sister        before age 30  . Heart disease Brother   . Heart attack Maternal Grandmother   . Colon polyps Sister   . COPD Sister   . Diabetes Daughter    Blood pressure 130/64, pulse 75, weight 261 lb 12.8 oz (118.8 kg), SpO2 98 %.   Wt Readings from Last 3 Encounters:  10/22/17 261 lb 12.8 oz (118.8 kg)  10/11/17 262 lb 9.6 oz (119.1 kg)  08/21/17 251 lb 12.8 oz (114.2 kg)    General: NAD, obese Neck: JVP 8 cm with HJR,  no thyromegaly or thyroid nodule.  Lungs: Clear to auscultation bilaterally with normal respiratory effort. CV: Nondisplaced PMI.  Heart regular S1/S2, no S3/S4, no murmur.  Trace ankle edema.  No  carotid bruit.  Normal pedal pulses.  Abdomen: Soft, nontender, no hepatosplenomegaly, no distention.  Skin: Intact without lesions or rashes.  Neurologic: Alert and oriented x 3.  Psych: Normal affect. Extremities: No clubbing or cyanosis.  HEENT: Normal.   ASSESSMENT/PLAN:  1. Chronic systolic CHF: Ischemic cardiomyopathy. Last echo in 8/18 showed EF 30% with severe  LV dilation and normal RV.  [place] CRT-D.  Weight is up today with some volume overload on exam, likely due to dietary indiscretion. - Increase torsemide to 80 mg daily alternating with 80 qam/40 qpm.  BMET today and in 10 days.   - Continue carvedilol 25 mg twice a day  - Continue Entresto 97/103 bid.     - Continue spironolactone 25 mg daily.  - Continue to work on cutting back sodium.   2. CAD s/p CABG: No chest pain. Continue 81 mg aspirin daily and atorvastatin daily.   - Good lipids in 8/18.   3. VT s/p ICD: On amiodarone for history of ICD discharges due to VT.   - Check LFTs and TSH. Knows he needs yearly eye exams.   - Given increased tremor, we have cut back amiodarone to 100 mg daily.  4. Morbid obesity: Still needs to work on diet/weight loss.  5. COPD: Moderate to severe.  6. Lung cancer: Has had radiation.  7. PAD: Occluded left SFA and significant right SFA stenosis on last CTA.  He does not report claudication and does not have pedal ulcers.  Followed by [facility]. He is on statin with optimized lipids.  8. AAA: s/p repair.  Followed at [facility].  No endoleak on last evaluation.   Followup in 6 wks  [name] 10/23/2017

## 2017-10-24 ENCOUNTER — Other Ambulatory Visit (HOSPITAL_COMMUNITY): Payer: Self-pay | Admitting: Pharmacist

## 2017-10-24 MED ORDER — SACUBITRIL-VALSARTAN 97-103 MG PO TABS: 1.0000 | ORAL_TABLET | Freq: Two times a day (BID) | ORAL | 3 refills | Status: DC

## 2017-10-31 ENCOUNTER — Ambulatory Visit (HOSPITAL_COMMUNITY)
Admission: RE | Admit: 2017-10-31 | Discharge: 2017-10-31 | Disposition: A | Discharge status: Home / Self Care | Payer: Medicare Other | Source: Ambulatory Visit | Attending: Internal Medicine | Admitting: Internal Medicine

## 2017-10-31 ENCOUNTER — Other Ambulatory Visit (HOSPITAL_COMMUNITY): Payer: 59

## 2017-10-31 DIAGNOSIS — I5042 Chronic combined systolic (congestive) and diastolic (congestive) heart failure: Secondary | ICD-10-CM | POA: Insufficient documentation

## 2017-10-31 LAB — BASIC METABOLIC PANEL
Anion gap: 7 (ref 5–15)
BUN: 26 mg/dL — ABNORMAL HIGH (ref 6–20)
CO2: 28 mmol/L (ref 22–32)
Calcium: 9.5 mg/dL (ref 8.9–10.3)
Chloride: 107 mmol/L (ref 101–111)
Creatinine, Ser: 1.05 mg/dL (ref 0.61–1.24)
GFR calc Af Amer: >60 mL/min (ref >60)
GFR calc non Af Amer: >60 mL/min (ref >60)
Glucose, Bld: 122 mg/dL — ABNORMAL HIGH (ref 65–99)
Potassium: 4.8 mmol/L (ref 3.5–5.1)
Sodium: 142 mmol/L (ref 135–145)

## 2017-11-05 DIAGNOSIS — R351 Nocturia: Secondary | ICD-10-CM | POA: Diagnosis not present

## 2017-11-05 DIAGNOSIS — Z87442 Personal history of urinary calculi: Secondary | ICD-10-CM | POA: Diagnosis not present

## 2017-11-05 DIAGNOSIS — N471 Phimosis: Secondary | ICD-10-CM | POA: Diagnosis not present

## 2017-11-05 DIAGNOSIS — N401 Enlarged prostate with lower urinary tract symptoms: Secondary | ICD-10-CM | POA: Diagnosis not present

## 2017-11-16 ENCOUNTER — Encounter: Payer: Self-pay | Admitting: Internal Medicine

## 2017-11-20 ENCOUNTER — Ambulatory Visit (INDEPENDENT_AMBULATORY_CARE_PROVIDER_SITE_OTHER): Payer: Medicare Other | Admitting: *Deleted

## 2017-11-20 DIAGNOSIS — I255 Ischemic cardiomyopathy: Secondary | ICD-10-CM

## 2017-11-20 NOTE — Progress Notes (Signed)
Remote ICD transmission.   

## 2017-11-22 ENCOUNTER — Other Ambulatory Visit (HOSPITAL_COMMUNITY): Payer: Self-pay | Admitting: Cardiology

## 2017-11-23 ENCOUNTER — Encounter: Payer: Self-pay | Admitting: Cardiology

## 2017-11-24 LAB — CUP PACEART REMOTE DEVICE CHECK
Battery Remaining Longevity: 84 mo
Battery Remaining Percentage: 100 %
Brady Statistic RA Percent Paced: 0 %
Brady Statistic RV Percent Paced: 90 %
Date Time Interrogation Session: 20181211111000
HighPow Impedance: 48 Ohm
Implantable Lead Implant Date: 20060411
Implantable Lead Implant Date: 20151030
Implantable Lead Implant Date: 20151030
Implantable Lead Location: 753858
Implantable Lead Location: 753859
Implantable Lead Location: 753860
Implantable Lead Model: 148
Implantable Lead Model: 4136
Implantable Lead Serial Number: 145070
Implantable Lead Serial Number: 29713415
Implantable Pulse Generator Implant Date: 20151030
Lead Channel Impedance Value: 566 Ohm
Lead Channel Impedance Value: 632 Ohm
Lead Channel Impedance Value: 731 Ohm
Lead Channel Pacing Threshold Amplitude: 0.7 V
Lead Channel Pacing Threshold Amplitude: 0.7 V
Lead Channel Pacing Threshold Amplitude: 1.9 V
Lead Channel Pacing Threshold Pulse Width: 0.4 ms
Lead Channel Pacing Threshold Pulse Width: 0.4 ms
Lead Channel Pacing Threshold Pulse Width: 0.9 ms
Lead Channel Setting Pacing Amplitude: 2 V
Lead Channel Setting Pacing Amplitude: 2 V
Lead Channel Setting Pacing Amplitude: 3.5 V
Lead Channel Setting Pacing Pulse Width: 0.4 ms
Lead Channel Setting Pacing Pulse Width: 0.9 ms
Lead Channel Setting Sensing Sensitivity: 0.6 mV
Lead Channel Setting Sensing Sensitivity: 1 mV
Pulse Gen Serial Number: 370874

## 2017-12-05 ENCOUNTER — Encounter: Payer: Medicare Other | Admitting: Internal Medicine

## 2017-12-12 ENCOUNTER — Telehealth (HOSPITAL_COMMUNITY): Payer: Self-pay | Admitting: Pharmacist

## 2017-12-12 NOTE — Telephone Encounter (Signed)
PANF renewed for [name] so that he will have $1000 to use toward his Entresto copay costs through 12/21/18.   Member ID: [id number] Group ID: [id number] RxBin ID: [id number] PCN: [id number] Eligibility Start Date: 12/22/2017 Eligibility End Date: 12/21/2018 Assistance Amount: $1,000.00   Doroteo Bradford K. Velva Harman, PharmD, BCPS, CPP Clinical Pharmacist Pager: [phone] Phone: [phone] 12/12/2017 2:31 PM

## 2017-12-14 ENCOUNTER — Other Ambulatory Visit (HOSPITAL_COMMUNITY): Payer: Self-pay | Admitting: *Deleted

## 2017-12-14 MED ORDER — TORSEMIDE 20 MG PO TABS: ORAL_TABLET | ORAL | 3 refills | Status: DC

## 2017-12-18 ENCOUNTER — Ambulatory Visit (HOSPITAL_COMMUNITY)
Admission: RE | Admit: 2017-12-18 | Discharge: 2017-12-18 | Disposition: A | Discharge status: Home / Self Care | Payer: Medicare Other | Source: Ambulatory Visit | Attending: Cardiology | Admitting: Cardiology

## 2017-12-18 ENCOUNTER — Telehealth: Payer: Self-pay | Admitting: Internal Medicine

## 2017-12-18 ENCOUNTER — Encounter (HOSPITAL_COMMUNITY): Payer: Self-pay

## 2017-12-18 ENCOUNTER — Other Ambulatory Visit: Payer: Self-pay

## 2017-12-18 ENCOUNTER — Encounter (HOSPITAL_COMMUNITY): Payer: 59 | Admitting: Cardiology

## 2017-12-18 ENCOUNTER — Other Ambulatory Visit (HOSPITAL_COMMUNITY): Payer: Self-pay

## 2017-12-18 ENCOUNTER — Encounter (HOSPITAL_COMMUNITY): Payer: Self-pay | Admitting: Cardiology

## 2017-12-18 VITALS — BP 136/54 | HR 72 | Wt 268.2 lb

## 2017-12-18 DIAGNOSIS — Z95810 Presence of automatic (implantable) cardiac defibrillator: Secondary | ICD-10-CM | POA: Insufficient documentation

## 2017-12-18 DIAGNOSIS — J441 Chronic obstructive pulmonary disease with (acute) exacerbation: Secondary | ICD-10-CM

## 2017-12-18 DIAGNOSIS — I2510 Atherosclerotic heart disease of native coronary artery without angina pectoris: Secondary | ICD-10-CM | POA: Diagnosis not present

## 2017-12-18 DIAGNOSIS — Z79899 Other long term (current) drug therapy: Secondary | ICD-10-CM | POA: Insufficient documentation

## 2017-12-18 DIAGNOSIS — I255 Ischemic cardiomyopathy: Secondary | ICD-10-CM | POA: Diagnosis not present

## 2017-12-18 DIAGNOSIS — K219 Gastro-esophageal reflux disease without esophagitis: Secondary | ICD-10-CM | POA: Insufficient documentation

## 2017-12-18 DIAGNOSIS — J449 Chronic obstructive pulmonary disease, unspecified: Secondary | ICD-10-CM | POA: Diagnosis not present

## 2017-12-18 DIAGNOSIS — I110 Hypertensive heart disease with heart failure: Secondary | ICD-10-CM | POA: Diagnosis not present

## 2017-12-18 DIAGNOSIS — N400 Enlarged prostate without lower urinary tract symptoms: Secondary | ICD-10-CM | POA: Insufficient documentation

## 2017-12-18 DIAGNOSIS — G4733 Obstructive sleep apnea (adult) (pediatric): Secondary | ICD-10-CM | POA: Diagnosis not present

## 2017-12-18 DIAGNOSIS — I472 Ventricular tachycardia: Secondary | ICD-10-CM | POA: Diagnosis not present

## 2017-12-18 DIAGNOSIS — Z801 Family history of malignant neoplasm of trachea, bronchus and lung: Secondary | ICD-10-CM | POA: Insufficient documentation

## 2017-12-18 DIAGNOSIS — Z923 Personal history of irradiation: Secondary | ICD-10-CM | POA: Insufficient documentation

## 2017-12-18 DIAGNOSIS — I5042 Chronic combined systolic (congestive) and diastolic (congestive) heart failure: Secondary | ICD-10-CM

## 2017-12-18 DIAGNOSIS — Z6839 Body mass index (BMI) 39.0-39.9, adult: Secondary | ICD-10-CM | POA: Insufficient documentation

## 2017-12-18 DIAGNOSIS — Z951 Presence of aortocoronary bypass graft: Secondary | ICD-10-CM | POA: Insufficient documentation

## 2017-12-18 DIAGNOSIS — I252 Old myocardial infarction: Secondary | ICD-10-CM | POA: Insufficient documentation

## 2017-12-18 DIAGNOSIS — I739 Peripheral vascular disease, unspecified: Secondary | ICD-10-CM

## 2017-12-18 DIAGNOSIS — Z833 Family history of diabetes mellitus: Secondary | ICD-10-CM | POA: Diagnosis not present

## 2017-12-18 DIAGNOSIS — Z87891 Personal history of nicotine dependence: Secondary | ICD-10-CM | POA: Insufficient documentation

## 2017-12-18 DIAGNOSIS — J984 Other disorders of lung: Secondary | ICD-10-CM | POA: Diagnosis not present

## 2017-12-18 DIAGNOSIS — Z8249 Family history of ischemic heart disease and other diseases of the circulatory system: Secondary | ICD-10-CM | POA: Insufficient documentation

## 2017-12-18 DIAGNOSIS — E039 Hypothyroidism, unspecified: Secondary | ICD-10-CM | POA: Insufficient documentation

## 2017-12-18 DIAGNOSIS — I714 Abdominal aortic aneurysm, without rupture: Secondary | ICD-10-CM | POA: Diagnosis not present

## 2017-12-18 DIAGNOSIS — Z7982 Long term (current) use of aspirin: Secondary | ICD-10-CM | POA: Insufficient documentation

## 2017-12-18 DIAGNOSIS — R0602 Shortness of breath: Secondary | ICD-10-CM | POA: Diagnosis not present

## 2017-12-18 DIAGNOSIS — C3490 Malignant neoplasm of unspecified part of unspecified bronchus or lung: Secondary | ICD-10-CM | POA: Diagnosis not present

## 2017-12-18 DIAGNOSIS — I5022 Chronic systolic (congestive) heart failure: Secondary | ICD-10-CM | POA: Diagnosis not present

## 2017-12-18 DIAGNOSIS — E785 Hyperlipidemia, unspecified: Secondary | ICD-10-CM | POA: Insufficient documentation

## 2017-12-18 LAB — COMPREHENSIVE METABOLIC PANEL
ALT: 18 U/L (ref 17–63)
AST: 23 U/L (ref 15–41)
Albumin: 3.7 g/dL (ref 3.5–5.0)
Alkaline Phosphatase: 75 U/L (ref 38–126)
Anion gap: 5 (ref 5–15)
BUN: 22 mg/dL — ABNORMAL HIGH (ref 6–20)
CO2: 28 mmol/L (ref 22–32)
Calcium: 9.1 mg/dL (ref 8.9–10.3)
Chloride: 107 mmol/L (ref 101–111)
Creatinine, Ser: 1.08 mg/dL (ref 0.61–1.24)
GFR calc Af Amer: >60 mL/min (ref >60)
GFR calc non Af Amer: >60 mL/min (ref >60)
Glucose, Bld: 136 mg/dL — ABNORMAL HIGH (ref 65–99)
Potassium: 4.4 mmol/L (ref 3.5–5.1)
Sodium: 140 mmol/L (ref 135–145)
Total Bilirubin: 0.8 mg/dL (ref 0.3–1.2)
Total Protein: 6.5 g/dL (ref 6.5–8.1)

## 2017-12-18 MED ORDER — TORSEMIDE 20 MG PO TABS: ORAL_TABLET | ORAL | 3 refills | Status: DC

## 2017-12-18 MED ORDER — PREDNISONE 20 MG PO TABS: ORAL_TABLET | ORAL | 0 refills | Status: DC

## 2017-12-18 MED ORDER — IPRATROPIUM BROMIDE HFA 17 MCG/ACT IN AERS: 2.0000 | INHALATION_SPRAY | RESPIRATORY_TRACT | 12 refills | Status: DC | PRN

## 2017-12-18 MED ORDER — DOXYCYCLINE HYCLATE 50 MG PO CAPS: 100.0000 mg | ORAL_CAPSULE | Freq: Two times a day (BID) | ORAL | 0 refills | Status: DC

## 2017-12-18 NOTE — H&P (View-Only) (Signed)
Patient ID: ITALO BANTON, male   DOB: January 29, 1946, 72 y.o.   MRN: [id number] [facility]  12/19/2017 BRECKON REEVES   [birth year]  [id number]  Primary Physicia [name], MD Hematologist: Dr Alen Blew  Pulmonary: Dr Annamaria Boots Cardiology: [name]  HPI:  Mr. Howdeshell is a pleasant 72 yo with a past medical history significant for morbid obesity, systolic heart failure with an EF of 30-35% (January 2014) -> 20-25% (March 2015 - RV normal), VT on amio, CAD s/p CABG 1996, LBBB, COPD, obstructive sleep apnea on CPAP, AAA repair (January 2015) who was admitted to [facility] on 02/24/14 for acute on chronic HF and acute COPD exacerbation. He also had iron deficiency anemia.   Admitted to [facility] July 24 through July 06 2014 with chest pain.  CEs negative. Had a LHC with no change from previous LHC with recommendations to continue medical management. Discharge weight was 255 pounds.   LHC 07/06/14 --No significant change from previous studies.  Left anterior descending (LAD): The LAD is a large vessel. There is a 40% stenosis immediately after the takeoff of a large septal perforator. There are 2 large diagonal branches without significant disease. Left circumflex (LCx): The LCx is occluded proximally. There are left to left collaterals.  Right coronary artery (RCA): The RCA is occluded proximally immediately following the conus branch. There are right to right and left to right collaterals. SVGs from CABG known to be totally occluded.   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   CTA chest/abd/pelvis (3/16) with moderate emphysema, 1.3 cm nodule RUL, left SFA totally occluded, right SFA with significant stenosis, s/p endovascular AAA repair (stable).   Echo (5/17) with EF 20-25%, severe LV dilation, mild MR, normal RV size with mildly decreased systolic function.   He has a RUL nodule that is most likely lung cancer, he is being treated with radiation.   Echo (8/18) with EF 30%, basal-mid  inferior and inferolateral AK, severe LV dilation, normal RV, mild MR.   He returns today for followup of CHF.  Weight is up 7 lbs.  He reports dietary indiscretion over the holidays.  He has been more short of breath for several weeks, this has been progressive. Currently dyspneic walking 100 feet (exhausted walking in today).  He has been wheezing.  He is short of breath bathing and dressing.  He is having to sit up at night to sleep.  No chest pain. No palpitations.  Oxygen saturation 95% at rest.  He still has a tremor that is bothersome to him, hard to write and to hold objects.  At last appointment, I asked him to decrease amiodarone but he is still taking 200 mg daily.   ECG (personally reviewed): NSR, BiV pacing  Labs 3/15 Cr 1.5 K 5.6 Labs 07/06/14 K 3.9 Creatinine  0.98 Labs 9/15 K 5.1, creatinine 0.96 Labs 11/15 K 3.8, creatinine 0.83, LDL 61, LFTs normal, TSH normal Labs 11/26/14 K 5.0 Creatinine 0.73 , LFTs normal, TSH normal,  Labs 12/08/14 K 3.4 Creatinine 0.83  Labs 3/16 K 4.4, creatinine 1.03, LFTs normal, TSH normal, HCT 37.8, BNP 65 Labs 6/16 K 4.1, creatinine 0.93, TSH normal, LFTs normal Labs 11/16 K 5.1, creatinine 1.13, LFTs normal, TSH normal, LDL 83, HDL 33, TGs 162 Labs 1/17 K 4.4, creatinine 0.86, pro-BNP 154 Labs 2/17 BNP 113, K 3.8, creatinine 0.79, TSH normal, LFTs normal Labs 4/17 K 3.8, creatinine 0.91, HCT 48.1 Labs 6/17 K 3.5, creatinine 0.98 Labs 9/17 K  3.9, creatinine 0.79, LDL 69, HDL 29, TSH normal, LFTs normal Labs 3/18 K 3.6, creatinine 0.87 Labs 8/18 K 3.3 => 4.2, creatinine 0.78 => 1.04, LDL 66, HDL 38, BNP 166 Labs 9/18: K 4.7, creatinine 0.9, BNP 144 Labs 11/18: K 4.8, creatinine 1.05, TSH normal, LFTs normal.   ROS: All systems reviewed and negative except as per HPI.   Past Medical History:  Diagnosis Date  . AAA (abdominal aortic aneurysm) ([place])    followed by Dr. Bridgett Larsson  . Adenomatous colon polyp 01/2004  . Anemia    hx  . Automatic  implantable cardioverter-defibrillator in situ   . AVM (arteriovenous malformation)   . BPH (benign prostatic hypertrophy)   . CAD (coronary artery disease)    s/p CABGx2 in 1996  . Carotid artery stenosis    LCEA - Dr. Bridgett Larsson in 2013  . CHF (congestive heart failure) ([place])   . Complication of anesthesia    claustrophobic, unabe to lie on back more than 4 hours at time due to back  . COPD (chronic obstructive pulmonary disease) ([place])   . Diabetes mellitus    DIET CONTROLLED- pt states that this was a misdiagnosis, he was treated while in the hospital  but returned home, loss a massive amount of weight and he has not had a problem with his blood sugar since. States everything has been normal for about 3 years.  . Diverticulosis   . Dyspnea   . Dysrhythmia    ICD-defibrillator  . Fatigue   . GERD (gastroesophageal reflux disease)   . H/O hiatal hernia   . History of radiation therapy 04/09/17-04/17/17   SBRT right lung 54 Gy in 3 fractions  . HLD (hyperlipidemia)   . Hypertension   . Hypothyroidism   . Ischemic cardiomyopathy   . Myocardial infarction ([place])   . OSA on CPAP    AHI durign total sleep 14.69/hr, during REM 50.91/hr  . Peripheral vascular disease (HCC)    LCEA, L renal artery stent, bilat iliac stents, R SFA stenosis  . Tremor   . Ventricular tachycardia ([place]) 09/09/2014   Amiodarone was started after appropriate defibrillator shocks for ventricular tachycardia in October 2008    Current Outpatient Medications  Medication Sig Dispense Refill  . albuterol (PROVENTIL HFA;VENTOLIN HFA) 108 (90 BASE) MCG/ACT inhaler Inhale 2 puffs into the lungs every 6 (six) hours as needed for wheezing or shortness of breath. 3 Inhaler 4  . albuterol (PROVENTIL) (2.5 MG/3ML) 0.083% nebulizer solution Take 3 mLs (2.5 mg total) by nebulization every 6 (six) hours. 120 mL 3  . amiodarone (PACERONE) 100 MG tablet Take 1 tablet (100 mg total) by mouth daily. 30 tablet 3  . amiodarone (PACERONE)  200 MG tablet TAKE 1 TABLET BY MOUTH  DAILY 90 tablet 3  . aspirin 81 MG tablet Take 81 mg by mouth daily.    Marland Kitchen atorvastatin (LIPITOR) 40 MG tablet TAKE 1 TABLET BY MOUTH  DAILY 90 tablet 3  . carvedilol (COREG) 25 MG tablet TAKE 1 TABLET BY MOUTH  TWICE A DAY WITH MEALS 180 tablet 3  . clopidogrel (PLAVIX) 75 MG tablet TAKE 1 TABLET BY MOUTH  DAILY 90 tablet 3  . finasteride (PROSCAR) 5 MG tablet Take 5 mg by mouth daily.    . fluticasone (FLONASE) 50 MCG/ACT nasal spray Instill 1 spray in each  nostril daily 32 g 3  . Fluticasone-Umeclidin-Vilant (TRELEGY ELLIPTA) 100-62.5-25 MCG/INH AEPB Inhale 1 puff into the lungs daily. 30 each 5  .  ipratropium (ATROVENT HFA) 17 MCG/ACT inhaler Inhale 2 puffs into the lungs every 4 (four) hours as needed for wheezing. 1 Inhaler 12  . isosorbide mononitrate (IMDUR) 60 MG 24 hr tablet Take one (1) tablet (60 mg) by mouth daily. 90 tablet 0  . levothyroxine (SYNTHROID, LEVOTHROID) 50 MCG tablet TAKE 1 TABLET BY MOUTH  DAILY BEFORE BREAKFAST 90 tablet 0  . nitroGLYCERIN (NITROSTAT) 0.4 MG SL tablet DISSOLVE ONE TABLET UNDER  THE TONGUE EVERY 5 MINUTES  AS NEEDED FOR CHEST PAIN  (UP TO 3 TABLETS IN 15  MINUTES THEN CALL 911) 75 tablet 0  . NON FORMULARY at bedtime. CPAP And during the day as needed    . potassium chloride SA (K-DUR,KLOR-CON) 20 MEQ tablet Take 1 tablet (20 mEq total) by mouth daily. 90 tablet 3  . sacubitril-valsartan (ENTRESTO) 97-103 MG Take 1 tablet 2 (two) times daily by mouth. 180 tablet 3  . spironolactone (ALDACTONE) 25 MG tablet TAKE 1 TABLET BY MOUTH  DAILY 90 tablet 3  . torsemide (DEMADEX) 20 MG tablet Take 80mg  every morning. Take 40mg  every other evening. 540 tablet 3  . doxycycline (VIBRAMYCIN) 50 MG capsule Take 2 capsules (100 mg total) by mouth 2 (two) times daily for 7 days. 28 capsule 0  . predniSONE (DELTASONE) 20 MG tablet 40 mg (2 tabs) daily for 2 days, 20 mg (1 tab) for 2 days, 10 mg (0.5 tab) for 2 days then stop 7 tablet  0   No current facility-administered medications for this encounter.     Allergies  Allergen Reactions  . Tussionex Pennkinetic Er [Hydrocod Polst-Cpm Polst Er] Other (See Comments)    UNSPECIFIED REACTION  > "caused prostate problems"  . Ace Inhibitors Cough    Social History   Socioeconomic History  . Marital status: Married    Spouse name: Not on file  . Number of children: 2  . Years of education: Not on file  . Highest education level: Not on file  Social Needs  . Financial resource strain: Not on file  . Food insecurity - worry: Not on file  . Food insecurity - inability: Not on file  . Transportation needs - medical: Not on file  . Transportation needs - non-medical: Not on file  Occupational History  . Occupation: retired Solicitor   Tobacco Use  . Smoking status: Former Smoker    Packs/day: 1.50    Years: 40.00    Pack years: 60.00    Types: Cigarettes, Pipe    Last attempt to quit: 12/11/2002    Years since quitting: 15.0  . Smokeless tobacco: Never Used  Substance and Sexual Activity  . Alcohol use: No    Alcohol/week: 0.0 oz  . Drug use: No  . Sexual activity: Not Currently  Other Topics Concern  . Not on file  Social History Narrative  . Not on file    Family History  Problem Relation Age of Onset  . Lung cancer Mother   . Cancer Mother   . Diabetes Mother   . Hypertension Mother   . Hyperlipidemia Mother   . Heart disease Mother        before age 29  . Heart attack Father   . Heart disease Father        before age 35  . Hypertension Father   . Colon cancer Sister   . Cancer Sister   . Hypertension Sister   . Hyperlipidemia Sister   . Diabetes  Sister   . Heart disease Sister        before age 78  . Heart disease Brother   . Heart attack Maternal Grandmother   . Colon polyps Sister   . COPD Sister   . Diabetes Daughter    Blood pressure (!) 136/54, pulse 72, weight 268 lb 4 oz (121.7 kg), SpO2 95 %.   Wt Readings from  Last 3 Encounters:  12/18/17 268 lb 4 oz (121.7 kg)  10/22/17 261 lb 12.8 oz (118.8 kg)  10/11/17 262 lb 9.6 oz (119.1 kg)    General: NAD Neck: JVP 8-9 cm, no thyromegaly or thyroid nodule.  Lungs: Wheezes bilaterally. CV: Nondisplaced PMI.  Heart regular S1/S2, no S3/S4, no murmur.  1+ ankle edema.  No carotid bruit.  Unable to palpate pedal pulses.  Abdomen: Soft, nontender, no hepatosplenomegaly, no distention.  Skin: Intact without lesions or rashes.  Neurologic: Alert and oriented x 3.  Psych: Normal affect. Extremities: No clubbing or cyanosis.  HEENT: Normal.   ASSESSMENT/PLAN:  1. Chronic systolic CHF: Ischemic cardiomyopathy. Last echo in 8/18 showed EF 30% with severe LV dilation and normal RV.  [place] CRT-D.  Weight is up and he is volume overloaded on exam.  NYHA class IIIb.  He has been having difficulty with worsening CHF symptoms actually over the last few months.  - Increase torsemide to 80 mg bid x 2 days then 80 qam/40 qpm.  BMET today and in 10 days.   - Continue carvedilol 25 mg twice a day  - Continue Entresto 97/103 bid.     - Continue spironolactone 25 mg daily.  - Continue to work on cutting back sodium.   - With worsening symptoms and more difficulty controlling volume, I am going to set him up for right/left heart catheterization to assess filling pressures on higher torsemide, assess cardiac output, and reassess coronaries. We discussed risks/benefits of procedure and he agrees to cath.  2. CAD s/p CABG: No chest pain but worsening exertional dyspnea. Continue 81 mg aspirin daily and atorvastatin daily.   - Good lipids in 8/18.   - See above discussion regarding repeat cath.  3. VT s/p ICD: On amiodarone for history of ICD discharges due to VT.   - Check LFTs today. Knows he needs yearly eye exams.   - Given increased tremor, I will have him cut back amiodarone to 100 mg daily (did not do this after last appointment).  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  As it is significantly bothering him, I will refer to neurology for evaluation.  4. Morbid obesity: Still needs to work on diet/weight loss.  5. COPD: Moderate to severe by history. He has significant wheezing on exam.  Hard for me to tell if his current symptoms are predominantly COPD or CHF (though he definitely has some volume overload).  - I will get a CXR.  - Given wheezing, I will treat as AECOPD.  Will give doxycycline 100 mg bid x 7 days and a short course of prednisone. He has atrovent and albuterol inhalers to use.  6. Lung cancer: Has had radiation.  7. PAD: Occluded left SFA and significant right SFA stenosis on last CTA.  He does not report claudication and does not have pedal ulcers.  Followed by [facility]. He is on statin with optimized lipids.  8. AAA: s/p repair.  Followed at [facility].  No endoleak on last evaluation.  Followup 2 wks after cath.   [name] 12/19/2017

## 2017-12-18 NOTE — Telephone Encounter (Signed)
Katie- What can we do for him?

## 2017-12-18 NOTE — Progress Notes (Signed)
Patient ID: [name], male   DOB: 13-Apr-1946, 72 y.o.   MRN: [id number] [facility]  12/19/2017 [name]   [birth year]  [id number]  Primary Physicia Tyrone Lima, MD Hematologist: Dr [name]  Pulmonary: Dr [name] Cardiology: Dr. Aundra Small  HPI:  [name] is a pleasant 72 yo with a past medical history significant for morbid obesity, systolic heart failure with an EF of 30-35% (January 2014) -> 20-25% (March 2015 - RV normal), VT on amio, CAD s/p CABG 1996, LBBB, COPD, obstructive sleep apnea on CPAP, AAA repair (January 2015) who was admitted to [facility] on 02/24/14 for acute on chronic HF and acute COPD exacerbation. He also had iron deficiency anemia.   Admitted to [facility] July 24 through July 06 2014 with chest pain.  CEs negative. Had a LHC with no change from previous LHC with recommendations to continue medical management. Discharge weight was 255 pounds.   LHC 07/06/14 --No significant change from previous studies.  Left anterior descending (LAD): The LAD is a large vessel. There is a 40% stenosis immediately after the takeoff of a large septal perforator. There are 2 large diagonal branches without significant disease. Left circumflex (LCx): The LCx is occluded proximally. There are left to left collaterals.  Right coronary artery (RCA): The RCA is occluded proximally immediately following the conus branch. There are right to right and left to right collaterals. SVGs from CABG known to be totally occluded.   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   CTA chest/abd/pelvis (3/16) with moderate emphysema, 1.3 cm nodule RUL, left SFA totally occluded, right SFA with significant stenosis, s/p endovascular AAA repair (stable).   Echo (5/17) with EF 20-25%, severe LV dilation, mild MR, normal RV size with mildly decreased systolic function.   He has a RUL nodule that is most likely lung cancer, he is being treated with radiation.   Echo (8/18) with EF 30%, basal-mid  inferior and inferolateral AK, severe LV dilation, normal RV, mild MR.   He returns today for followup of CHF.  Weight is up 7 lbs.  He reports dietary indiscretion over the holidays.  He has been more short of breath for several weeks, this has been progressive. Currently dyspneic walking 100 feet (exhausted walking in today).  He has been wheezing.  He is short of breath bathing and dressing.  He is having to sit up at night to sleep.  No chest pain. No palpitations.  Oxygen saturation 95% at rest.  He still has a tremor that is bothersome to him, hard to write and to hold objects.  At last appointment, I asked him to decrease amiodarone but he is still taking 200 mg daily.   ECG (personally reviewed): NSR, BiV pacing  Labs 3/15 Cr 1.5 K 5.6 Labs 07/06/14 K 3.9 Creatinine  0.98 Labs 9/15 K 5.1, creatinine 0.96 Labs 11/15 K 3.8, creatinine 0.83, LDL 61, LFTs normal, TSH normal Labs 11/26/14 K 5.0 Creatinine 0.73 , LFTs normal, TSH normal,  Labs 12/08/14 K 3.4 Creatinine 0.83  Labs 3/16 K 4.4, creatinine 1.03, LFTs normal, TSH normal, HCT 37.8, BNP 65 Labs 6/16 K 4.1, creatinine 0.93, TSH normal, LFTs normal Labs 11/16 K 5.1, creatinine 1.13, LFTs normal, TSH normal, LDL 83, HDL 33, TGs 162 Labs 1/17 K 4.4, creatinine 0.86, pro-BNP 154 Labs 2/17 BNP 113, K 3.8, creatinine 0.79, TSH normal, LFTs normal Labs 4/17 K 3.8, creatinine 0.91, HCT 48.1 Labs 6/17 K 3.5, creatinine 0.98 Labs 9/17 K  3.9, creatinine 0.79, LDL 69, HDL 29, TSH normal, LFTs normal Labs 3/18 K 3.6, creatinine 0.87 Labs 8/18 K 3.3 => 4.2, creatinine 0.78 => 1.04, LDL 66, HDL 38, BNP 166 Labs 9/18: K 4.7, creatinine 0.9, BNP 144 Labs 11/18: K 4.8, creatinine 1.05, TSH normal, LFTs normal.   ROS: All systems reviewed and negative except as per HPI.   Past Medical History:  Diagnosis Date  . AAA (abdominal aortic aneurysm) ([place])    followed by Dr. Bridgett Small  . Adenomatous colon polyp 01/2004  . Anemia    hx  . Automatic  implantable cardioverter-defibrillator in situ   . AVM (arteriovenous malformation)   . BPH (benign prostatic hypertrophy)   . CAD (coronary artery disease)    s/p CABGx2 in 1996  . Carotid artery stenosis    LCEA - Dr. Bridgett Small in 2013  . CHF (congestive heart failure) ([place])   . Complication of anesthesia    claustrophobic, unabe to lie on back more than 4 hours at time due to back  . COPD (chronic obstructive pulmonary disease) ([place])   . Diabetes mellitus    DIET CONTROLLED- pt states that this was a misdiagnosis, he was treated while in the hospital  but returned home, loss a massive amount of weight and he has not had a problem with his blood sugar since. States everything has been normal for about 3 years.  . Diverticulosis   . Dyspnea   . Dysrhythmia    ICD-defibrillator  . Fatigue   . GERD (gastroesophageal reflux disease)   . H/O hiatal hernia   . History of radiation therapy 04/09/17-04/17/17   SBRT right lung 54 Gy in 3 fractions  . HLD (hyperlipidemia)   . Hypertension   . Hypothyroidism   . Ischemic cardiomyopathy   . Myocardial infarction ([place])   . OSA on CPAP    AHI durign total sleep 14.69/hr, during REM 50.91/hr  . Peripheral vascular disease ([facility])    LCEA, L renal artery stent, bilat iliac stents, R SFA stenosis  . Tremor   . Ventricular tachycardia ([place]) 09/09/2014   Amiodarone was started after appropriate defibrillator shocks for ventricular tachycardia in October 2008    Current Outpatient Medications  Medication Sig Dispense Refill  . albuterol (PROVENTIL HFA;VENTOLIN HFA) 108 (90 BASE) MCG/ACT inhaler Inhale 2 puffs into the lungs every 6 (six) hours as needed for wheezing or shortness of breath. 3 Inhaler 4  . albuterol (PROVENTIL) (2.5 MG/3ML) 0.083% nebulizer solution Take 3 mLs (2.5 mg total) by nebulization every 6 (six) hours. 120 mL 3  . amiodarone (PACERONE) 100 MG tablet Take 1 tablet (100 mg total) by mouth daily. 30 tablet 3  . amiodarone (PACERONE)  200 MG tablet TAKE 1 TABLET BY MOUTH  DAILY 90 tablet 3  . aspirin 81 MG tablet Take 81 mg by mouth daily.    Marland Kitchen atorvastatin (LIPITOR) 40 MG tablet TAKE 1 TABLET BY MOUTH  DAILY 90 tablet 3  . carvedilol (COREG) 25 MG tablet TAKE 1 TABLET BY MOUTH  TWICE A DAY WITH MEALS 180 tablet 3  . clopidogrel (PLAVIX) 75 MG tablet TAKE 1 TABLET BY MOUTH  DAILY 90 tablet 3  . finasteride (PROSCAR) 5 MG tablet Take 5 mg by mouth daily.    . fluticasone (FLONASE) 50 MCG/ACT nasal spray Instill 1 spray in each  nostril daily 32 g 3  . Fluticasone-Umeclidin-Vilant (TRELEGY ELLIPTA) 100-62.5-25 MCG/INH AEPB Inhale 1 puff into the lungs daily. 30 each 5  .  ipratropium (ATROVENT HFA) 17 MCG/ACT inhaler Inhale 2 puffs into the lungs every 4 (four) hours as needed for wheezing. 1 Inhaler 12  . isosorbide mononitrate (IMDUR) 60 MG 24 hr tablet Take one (1) tablet (60 mg) by mouth daily. 90 tablet 0  . levothyroxine (SYNTHROID, LEVOTHROID) 50 MCG tablet TAKE 1 TABLET BY MOUTH  DAILY BEFORE BREAKFAST 90 tablet 0  . nitroGLYCERIN (NITROSTAT) 0.4 MG SL tablet DISSOLVE ONE TABLET UNDER  THE TONGUE EVERY 5 MINUTES  AS NEEDED FOR CHEST PAIN  (UP TO 3 TABLETS IN 15  MINUTES THEN CALL 911) 75 tablet 0  . NON FORMULARY at bedtime. CPAP And during the day as needed    . potassium chloride SA (K-DUR,KLOR-CON) 20 MEQ tablet Take 1 tablet (20 mEq total) by mouth daily. 90 tablet 3  . sacubitril-valsartan (ENTRESTO) 97-103 MG Take 1 tablet 2 (two) times daily by mouth. 180 tablet 3  . spironolactone (ALDACTONE) 25 MG tablet TAKE 1 TABLET BY MOUTH  DAILY 90 tablet 3  . torsemide (DEMADEX) 20 MG tablet Take 80mg  every morning. Take 40mg  every other evening. 540 tablet 3  . doxycycline (VIBRAMYCIN) 50 MG capsule Take 2 capsules (100 mg total) by mouth 2 (two) times daily for 7 days. 28 capsule 0  . predniSONE (DELTASONE) 20 MG tablet 40 mg (2 tabs) daily for 2 days, 20 mg (1 tab) for 2 days, 10 mg (0.5 tab) for 2 days then stop 7 tablet  0   No current facility-administered medications for this encounter.     Allergies  Allergen Reactions  . Tussionex Pennkinetic Er [Hydrocod Polst-Cpm Polst Er] Other (See Comments)    UNSPECIFIED REACTION  > "caused prostate problems"  . Ace Inhibitors Cough    Social History   Socioeconomic History  . Marital status: Married    Spouse name: Not on file  . Number of children: 2  . Years of education: Not on file  . Highest education level: Not on file  Social Needs  . Financial resource strain: Not on file  . Food insecurity - worry: Not on file  . Food insecurity - inability: Not on file  . Transportation needs - medical: Not on file  . Transportation needs - non-medical: Not on file  Occupational History  . Occupation: retired Solicitor   Tobacco Use  . Smoking status: Former Smoker    Packs/day: 1.50    Years: 40.00    Pack years: 60.00    Types: Cigarettes, Pipe    Last attempt to quit: 12/11/2002    Years since quitting: 15.0  . Smokeless tobacco: Never Used  Substance and Sexual Activity  . Alcohol use: No    Alcohol/week: 0.0 oz  . Drug use: No  . Sexual activity: Not Currently  Other Topics Concern  . Not on file  Social History Narrative  . Not on file    Family History  Problem Relation Age of Onset  . Lung cancer Mother   . Cancer Mother   . Diabetes Mother   . Hypertension Mother   . Hyperlipidemia Mother   . Heart disease Mother        before age 59  . Heart attack Father   . Heart disease Father        before age 26  . Hypertension Father   . Colon cancer Sister   . Cancer Sister   . Hypertension Sister   . Hyperlipidemia Sister   . Diabetes  Sister   . Heart disease Sister        before age 32  . Heart disease Brother   . Heart attack Maternal Grandmother   . Colon polyps Sister   . COPD Sister   . Diabetes Daughter    Blood pressure (!) 136/54, pulse 72, weight 268 lb 4 oz (121.7 kg), SpO2 95 %.   Wt Readings from  Last 3 Encounters:  12/18/17 268 lb 4 oz (121.7 kg)  10/22/17 261 lb 12.8 oz (118.8 kg)  10/11/17 262 lb 9.6 oz (119.1 kg)    General: NAD Neck: JVP 8-9 cm, no thyromegaly or thyroid nodule.  Lungs: Wheezes bilaterally. CV: Nondisplaced PMI.  Heart regular S1/S2, no S3/S4, no murmur.  1+ ankle edema.  No carotid bruit.  Unable to palpate pedal pulses.  Abdomen: Soft, nontender, no hepatosplenomegaly, no distention.  Skin: Intact without lesions or rashes.  Neurologic: Alert and oriented x 3.  Psych: Normal affect. Extremities: No clubbing or cyanosis.  HEENT: Normal.   ASSESSMENT/PLAN:  1. Chronic systolic CHF: Ischemic cardiomyopathy. Last echo in 8/18 showed EF 30% with severe LV dilation and normal RV.  [place] CRT-D.  Weight is up and he is volume overloaded on exam.  NYHA class IIIb.  He has been having difficulty with worsening CHF symptoms actually over the last few months.  - Increase torsemide to 80 mg bid x 2 days then 80 qam/40 qpm.  BMET today and in 10 days.   - Continue carvedilol 25 mg twice a day  - Continue Entresto 97/103 bid.     - Continue spironolactone 25 mg daily.  - Continue to work on cutting back sodium.   - With worsening symptoms and more difficulty controlling volume, I am going to set him up for right/left heart catheterization to assess filling pressures on higher torsemide, assess cardiac output, and reassess coronaries. We discussed risks/benefits of procedure and he agrees to cath.  2. CAD s/p CABG: No chest pain but worsening exertional dyspnea. Continue 81 mg aspirin daily and atorvastatin daily.   - Good lipids in 8/18.   - See above discussion regarding repeat cath.  3. VT s/p ICD: On amiodarone for history of ICD discharges due to VT.   - Check LFTs today. Knows he needs yearly eye exams.   - Given increased tremor, I will have him cut back amiodarone to 100 mg daily (did not do this after last appointment).  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  As it is significantly bothering him, I will refer to neurology for evaluation.  4. Morbid obesity: Still needs to work on diet/weight loss.  5. COPD: Moderate to severe by history. He has significant wheezing on exam.  Hard for me to tell if his current symptoms are predominantly COPD or CHF (though he definitely has some volume overload).  - I will get a CXR.  - Given wheezing, I will treat as AECOPD.  Will give doxycycline 100 mg bid x 7 days and a short course of prednisone. He has atrovent and albuterol inhalers to use.  6. Lung cancer: Has had radiation.  7. PAD: Occluded left SFA and significant right SFA stenosis on last CTA.  He does not report claudication and does not have pedal ulcers.  Followed by [facility]. He is on statin with optimized lipids.  8. AAA: s/p repair.  Followed at [facility].  No endoleak on last evaluation.  Followup 2 wks after cath.   [name] 12/19/2017

## 2017-12-18 NOTE — Telephone Encounter (Signed)
Spoke with pt's spouse, scheduled at below requested date/time.  Nothing further needed.

## 2017-12-18 NOTE — Telephone Encounter (Signed)
Call the patient to schedule appointment.

## 2017-12-18 NOTE — Patient Instructions (Addendum)
Start Doxy cline 100 mg (2 tabs), twice a day  Start Prednisone 40 mg (2 tabs) daily for 2 days   -20 mg (1 tab) daily for 2 days   -10 mg (1/2 tab) daily for 2 days, then stop  Increase Torsemide 80 mg (4 tabs), twice a day for 2 days   -Then go back to 80 in AM, and 40 in PM  Have chest x ray today  Labs drawn today (if we do not call you, then your lab work was stable)   Your physician recommends that you return for lab work in: 10 days   Your physician has requested that you have a cardiac catheterization. Cardiac catheterization is used to diagnose and/or treat various heart conditions. Doctors may recommend this procedure for a number of different reasons. The most common reason is to evaluate chest pain. Chest pain can be a symptom of coronary artery disease (CAD), and cardiac catheterization can show whether plaque is narrowing or blocking your heart's arteries. This procedure is also used to evaluate the valves, as well as measure the blood flow and oxygen levels in different parts of your heart. For further information please visit [facility]. Please follow instruction sheet, as given.  You have been referred to Neurology (they will call you)   You have been referred to Follow up with Pulmonology [name] (you will need to call and schedule)  Your physician recommends that you schedule a follow-up appointment in: 3 weeks with [name]

## 2017-12-18 NOTE — Telephone Encounter (Signed)
Pt can be seen Thursday this week at 11:30am.

## 2017-12-18 NOTE — Telephone Encounter (Signed)
Pt not seen since 08/2016.   CY/KW please advise if pt can be worked into schedule.  No availability with NP this week.

## 2017-12-19 ENCOUNTER — Encounter: Payer: Self-pay | Admitting: Neurology

## 2017-12-19 ENCOUNTER — Other Ambulatory Visit (HOSPITAL_COMMUNITY): Payer: Self-pay | Admitting: *Deleted

## 2017-12-20 ENCOUNTER — Encounter: Payer: Self-pay | Admitting: Internal Medicine

## 2017-12-20 ENCOUNTER — Ambulatory Visit (INDEPENDENT_AMBULATORY_CARE_PROVIDER_SITE_OTHER): Payer: Medicare Other | Admitting: Internal Medicine

## 2017-12-20 VITALS — BP 128/64 | HR 71 | Ht 68.0 in | Wt 263.4 lb

## 2017-12-20 DIAGNOSIS — R0609 Other forms of dyspnea: Secondary | ICD-10-CM

## 2017-12-20 DIAGNOSIS — R911 Solitary pulmonary nodule: Secondary | ICD-10-CM | POA: Diagnosis not present

## 2017-12-20 DIAGNOSIS — G4733 Obstructive sleep apnea (adult) (pediatric): Secondary | ICD-10-CM

## 2017-12-20 DIAGNOSIS — J449 Chronic obstructive pulmonary disease, unspecified: Secondary | ICD-10-CM

## 2017-12-20 DIAGNOSIS — J441 Chronic obstructive pulmonary disease with (acute) exacerbation: Secondary | ICD-10-CM | POA: Diagnosis not present

## 2017-12-20 DIAGNOSIS — R251 Tremor, unspecified: Secondary | ICD-10-CM | POA: Diagnosis not present

## 2017-12-20 DIAGNOSIS — I255 Ischemic cardiomyopathy: Secondary | ICD-10-CM | POA: Diagnosis not present

## 2017-12-20 DIAGNOSIS — Z9989 Dependence on other enabling machines and devices: Secondary | ICD-10-CM

## 2017-12-20 DIAGNOSIS — I2589 Other forms of chronic ischemic heart disease: Secondary | ICD-10-CM | POA: Diagnosis not present

## 2017-12-20 DIAGNOSIS — R0600 Dyspnea, unspecified: Secondary | ICD-10-CM

## 2017-12-20 MED ORDER — DIAZEPAM 5 MG PO TABS: ORAL_TABLET | ORAL | 5 refills | Status: DC

## 2017-12-20 NOTE — Patient Instructions (Signed)
Order- Onox on CPAP on room air     Dx COPD exacerbation  Order- Qualifying walk test     Dx Dyspnea on exertion  Continue CPAP   Finish doxycycline and prednisone as planned  Ok to continue Trelegy  Script for diazepam to take occasionally for tremor

## 2017-12-20 NOTE — Progress Notes (Signed)
HPI 72 year old male former smoker followed for dyspnea/COPD, lung nodule/ XRT, OSA complicated by CAD/CHF/MI/AICD/CM/AAA, AICD, morbid obesity DM 2 HBP, GERD NPSG 11/05/07- [facility]- AHI 14.6/ hr, desaturation to 84%, body weight 279 lbs  -------------------------------------------------------------------  08/17/2016-72 year old male former smoker followed for dyspnea/COPD, lung nodule, OSA complicated by CAD/CHF/MI/AICD/CM/AAA, morbid obesity CPAP 12 or auto/[facility] FOLLOWS FOR: DME: [facility]. Pt wears CPAP every night for at least 8 hours and at nap time. No DL. Needs to adjust pressure-seems too weak. Echocardiogram 04/14/2016-EF 20-25%, diffuse hypokinesis grade 1 diastolic dysfunction Breathing better since he dieted off 40 pounds over last several months. Thinks CPAP pressure is a little low. Uses old machine for travel so download shows lower compliance but he really uses it almost every night.  12/20/17- 72 year old male former smoker followed for dyspnea/COPD, lung nodule/ XRT, OSA complicated by CAD/CHF/MI/AICD/CM/AAA, morbid obesity,  DM 2 HBP, GERD CPAP 12 /[facility] -----SOB at rest and exertion,OSA uses CPAP every night and at times during the day,has been using albuterol nebs 3 times/day  Easy dyspnea on exertion especially in the last 2 or 3 months.  Recognizes problem is mainly cardiac and now pending heart cath. He has been treated with XRT for PET positive RUL nodule, now with recurrence and extension- no biopsy dx. Having a bad cold around Christmas time now resolved.  Continues nebulizer/albuterol 2 or 3 times daily, Trelegy once daily and currently finishing doxycycline and prednisone. Or is getting worse such that he does not want to eat in public because he cannot control his utensils.  I suggested a trial of diazepam but he will want to reassess that with his PCP. Download 100% compliance, AHI 1.3/hour.  He does not want to sleep without  CPAP. CT chest 09/26/17 IMPRESSION: 1. Response to therapy of right upper lobe lung nodule. 2. Interval enlargement of central right upper and posterior left upper lobe pulmonary nodules, suspicious for either synchronous primary bronchogenic carcinomas or pulmonary metastasis. 3.  Emphysema (ICD10-J43.9). 4.  Aortic Atherosclerosis (ICD10-I70.0). 5. Esophageal air fluid level suggests dysmotility or gastroesophageal reflux. CXR 12/18/17 IMPRESSION: Increased right upper lobe scarring since prior exam. No active disease.  ROS-see HPI    + = positive Constitutional:  +  weight loss, night sweats, fevers, chills, fatigue, lassitude. HEENT:   No-  headaches, difficulty swallowing, tooth/dental problems, sore throat,       No-  sneezing, itching, ear ache, nasal congestion, post nasal drip,  CV:  No-   chest pain, +orthopnea, PND, +swelling in lower extremities, No-anasarca, dizziness, palpitations Resp: + shortness of breath with exertion or at rest.              No-   productive cough,  No non-productive cough,  No- coughing up of blood.              No-   change in color of mucus.  No- wheezing.   Skin: No-   rash or lesions. GI:  No-   heartburn, indigestion, abdominal pain, nausea, vomiting, GU: . MS:  + joint pain or swelling.  Neuro-     nothing unusual Psych:  No- change in mood or affect. No depression or anxiety.  No memory loss.  OBJ- Physical Exam   93%t saturation at rest on room air on arrival. General- Alert, Oriented, Affect-appropriate, Distress- none acute, +obese  Skin- clear Lymphadenopathy- none Head- atraumatic            Eyes- Gross vision  intact, PERRLA, conjunctivae and secretions clear            Ears- Hearing, canals-normal            Nose- +turbinate edema, no-Septal dev, mucus, polyps, erosion, perforation             Throat- Mallampati III, mucosa clear , drainage- none, tonsils- atrophic Neck- flexible , trachea midline, no stridor , thyroid nl,  carotid no bruit Chest - symmetrical excursion , unlabored           Heart/CV- RRR , no murmur , no gallop  , no rub, nl s1 s2                           - JVD- none , edema -none, stasis changes- none, varices- none           Lung-+light wheeze and cough with deep breath/unlabored shallow inspiratory effort, rales- none, wheeze-none, cough- none ,  dullness-none, rub- none,            Chest wall- sternotomy scar,  Pacemaker defibrillator L Abd-  Br/ Gen/ Rectal- Not done, not indicated Extrem- cyanosis- none, clubbing, none, atrophy- none, strength- nl, vein donor scars Neuro- +head bob tremor

## 2017-12-21 ENCOUNTER — Telehealth (HOSPITAL_COMMUNITY): Payer: Self-pay | Admitting: *Deleted

## 2017-12-21 NOTE — Telephone Encounter (Signed)
Result Notes for DG Chest 2 View   Notes recorded by Darron Doom, RN on 12/21/2017 at 8:48 AM EST Patient wife called back, she is aware of results and no further questions. ------  Notes recorded by Shirley Muscat, RN on 12/20/2017 at 11:24 AM EST Left VM  ------  Notes recorded by Larey Dresser, MD on 12/20/2017 at 7:30 AM EST No active disease

## 2017-12-22 DIAGNOSIS — R251 Tremor, unspecified: Secondary | ICD-10-CM | POA: Insufficient documentation

## 2017-12-22 NOTE — Assessment & Plan Note (Signed)
Pending follow-up cardiac cath for update, followed by cardiology

## 2017-12-22 NOTE — Assessment & Plan Note (Signed)
He continues to be followed by radiation oncology

## 2017-12-22 NOTE — Assessment & Plan Note (Signed)
Increased dyspnea on exertion.  I think this is mostly cardiac. Plan-walk test and overnight oximetry on room air to assess for hypoxia.

## 2017-12-22 NOTE — Assessment & Plan Note (Signed)
He asked for my help while here for socially embarrassing tremor.  He will need to follow this up with his PCP or neurology.  I agreed to let him try diazepam 5 mg, 1 or 2 for occasional use if needed.

## 2017-12-22 NOTE — Assessment & Plan Note (Addendum)
Good compliance and control are documented by download.  He reports excellent improvement in sleep quality with CPAP and wife confirms he does not snore.

## 2017-12-28 ENCOUNTER — Other Ambulatory Visit (HOSPITAL_COMMUNITY): Payer: Medicare Other

## 2017-12-28 ENCOUNTER — Ambulatory Visit (HOSPITAL_COMMUNITY)
Admission: RE | Admit: 2017-12-28 | Discharge: 2017-12-28 | Disposition: A | Discharge status: Home / Self Care | Payer: Medicare Other | Source: Ambulatory Visit | Attending: Cardiology | Admitting: Cardiology

## 2017-12-28 ENCOUNTER — Encounter (HOSPITAL_COMMUNITY)
Admission: RE | Disposition: A | Discharge status: Home / Self Care | Payer: Self-pay | Source: Ambulatory Visit | Attending: Cardiology

## 2017-12-28 DIAGNOSIS — Z9989 Dependence on other enabling machines and devices: Secondary | ICD-10-CM | POA: Insufficient documentation

## 2017-12-28 DIAGNOSIS — Z951 Presence of aortocoronary bypass graft: Secondary | ICD-10-CM | POA: Insufficient documentation

## 2017-12-28 DIAGNOSIS — C3490 Malignant neoplasm of unspecified part of unspecified bronchus or lung: Secondary | ICD-10-CM | POA: Diagnosis not present

## 2017-12-28 DIAGNOSIS — Z7951 Long term (current) use of inhaled steroids: Secondary | ICD-10-CM | POA: Diagnosis not present

## 2017-12-28 DIAGNOSIS — J439 Emphysema, unspecified: Secondary | ICD-10-CM | POA: Diagnosis not present

## 2017-12-28 DIAGNOSIS — G4733 Obstructive sleep apnea (adult) (pediatric): Secondary | ICD-10-CM | POA: Insufficient documentation

## 2017-12-28 DIAGNOSIS — I255 Ischemic cardiomyopathy: Secondary | ICD-10-CM | POA: Insufficient documentation

## 2017-12-28 DIAGNOSIS — Z79899 Other long term (current) drug therapy: Secondary | ICD-10-CM | POA: Insufficient documentation

## 2017-12-28 DIAGNOSIS — Z825 Family history of asthma and other chronic lower respiratory diseases: Secondary | ICD-10-CM | POA: Insufficient documentation

## 2017-12-28 DIAGNOSIS — Z95810 Presence of automatic (implantable) cardiac defibrillator: Secondary | ICD-10-CM | POA: Insufficient documentation

## 2017-12-28 DIAGNOSIS — E039 Hypothyroidism, unspecified: Secondary | ICD-10-CM | POA: Insufficient documentation

## 2017-12-28 DIAGNOSIS — E1151 Type 2 diabetes mellitus with diabetic peripheral angiopathy without gangrene: Secondary | ICD-10-CM | POA: Insufficient documentation

## 2017-12-28 DIAGNOSIS — Z6839 Body mass index (BMI) 39.0-39.9, adult: Secondary | ICD-10-CM | POA: Insufficient documentation

## 2017-12-28 DIAGNOSIS — I509 Heart failure, unspecified: Secondary | ICD-10-CM | POA: Diagnosis not present

## 2017-12-28 DIAGNOSIS — I472 Ventricular tachycardia: Secondary | ICD-10-CM | POA: Insufficient documentation

## 2017-12-28 DIAGNOSIS — Z923 Personal history of irradiation: Secondary | ICD-10-CM | POA: Insufficient documentation

## 2017-12-28 DIAGNOSIS — Z7902 Long term (current) use of antithrombotics/antiplatelets: Secondary | ICD-10-CM | POA: Insufficient documentation

## 2017-12-28 DIAGNOSIS — E785 Hyperlipidemia, unspecified: Secondary | ICD-10-CM | POA: Insufficient documentation

## 2017-12-28 DIAGNOSIS — I2510 Atherosclerotic heart disease of native coronary artery without angina pectoris: Secondary | ICD-10-CM | POA: Insufficient documentation

## 2017-12-28 DIAGNOSIS — Z7982 Long term (current) use of aspirin: Secondary | ICD-10-CM | POA: Insufficient documentation

## 2017-12-28 DIAGNOSIS — Z87891 Personal history of nicotine dependence: Secondary | ICD-10-CM | POA: Insufficient documentation

## 2017-12-28 DIAGNOSIS — I447 Left bundle-branch block, unspecified: Secondary | ICD-10-CM | POA: Insufficient documentation

## 2017-12-28 DIAGNOSIS — I5022 Chronic systolic (congestive) heart failure: Secondary | ICD-10-CM | POA: Diagnosis not present

## 2017-12-28 DIAGNOSIS — Z8249 Family history of ischemic heart disease and other diseases of the circulatory system: Secondary | ICD-10-CM | POA: Insufficient documentation

## 2017-12-28 DIAGNOSIS — I110 Hypertensive heart disease with heart failure: Secondary | ICD-10-CM | POA: Diagnosis not present

## 2017-12-28 DIAGNOSIS — Z833 Family history of diabetes mellitus: Secondary | ICD-10-CM | POA: Insufficient documentation

## 2017-12-28 DIAGNOSIS — I5042 Chronic combined systolic (congestive) and diastolic (congestive) heart failure: Secondary | ICD-10-CM

## 2017-12-28 DIAGNOSIS — I252 Old myocardial infarction: Secondary | ICD-10-CM | POA: Diagnosis not present

## 2017-12-28 HISTORY — PX: RIGHT/LEFT HEART CATH AND CORONARY ANGIOGRAPHY: CATH118266

## 2017-12-28 LAB — POCT I-STAT 3, ART BLOOD GAS (G3+)
Acid-base deficit: 1 mmol/L (ref 0.0–2.0)
Acid-base deficit: 2 mmol/L (ref 0.0–2.0)
Bicarbonate: 25.1 mmol/L (ref 20.0–28.0)
Bicarbonate: 25.7 mmol/L (ref 20.0–28.0)
O2 Saturation: 70 %
O2 Saturation: 75 %
TCO2: 27 mmol/L (ref 22–32)
TCO2: 27 mmol/L (ref 22–32)
pCO2 arterial: 49.3 mmHg — ABNORMAL HIGH (ref 32.0–48.0)
pCO2 arterial: 49.7 mmHg — ABNORMAL HIGH (ref 32.0–48.0)
pH, Arterial: 7.315 — ABNORMAL LOW (ref 7.350–7.450)
pH, Arterial: 7.321 — ABNORMAL LOW (ref 7.350–7.450)
pO2, Arterial: 40 mmHg — CL (ref 83.0–108.0)
pO2, Arterial: 44 mmHg — ABNORMAL LOW (ref 83.0–108.0)

## 2017-12-28 LAB — CBC
HCT: 45.9 % (ref 39.0–52.0)
Hemoglobin: 14.9 g/dL (ref 13.0–17.0)
MCH: 31.4 pg (ref 26.0–34.0)
MCHC: 32.5 g/dL (ref 30.0–36.0)
MCV: 96.8 fL (ref 78.0–100.0)
Platelets: 135 10*3/uL — ABNORMAL LOW (ref 150–400)
RBC: 4.74 MIL/uL (ref 4.22–5.81)
RDW: 13.6 % (ref 11.5–15.5)
WBC: 6.8 10*3/uL (ref 4.0–10.5)

## 2017-12-28 LAB — PROTIME-INR
INR: 1.01
Prothrombin Time: 13.2 seconds (ref 11.4–15.2)

## 2017-12-28 SURGERY — RIGHT/LEFT HEART CATH AND CORONARY ANGIOGRAPHY
Anesthesia: LOCAL

## 2017-12-28 MED ORDER — ONDANSETRON HCL 4 MG/2ML IJ SOLN: 4.0000 mg | Freq: Four times a day (QID) | INTRAMUSCULAR | Status: DC | PRN

## 2017-12-28 MED ORDER — LIDOCAINE HCL (PF) 1 % IJ SOLN
INTRAMUSCULAR | Status: AC
  Filled 2017-12-28: qty 30

## 2017-12-28 MED ORDER — HEPARIN (PORCINE) IN NACL 2-0.9 UNIT/ML-% IJ SOLN
INTRAMUSCULAR | Status: AC
  Filled 2017-12-28: qty 500

## 2017-12-28 MED ORDER — SODIUM CHLORIDE 0.9% FLUSH: 3.0000 mL | INTRAVENOUS | Status: DC | PRN

## 2017-12-28 MED ORDER — SODIUM CHLORIDE 0.9 % IV SOLN
INTRAVENOUS | Status: DC
  Administered 2017-12-28: 13:00:00 via INTRAVENOUS

## 2017-12-28 MED ORDER — MIDAZOLAM HCL 2 MG/2ML IJ SOLN
INTRAMUSCULAR | Status: AC
  Filled 2017-12-28: qty 2

## 2017-12-28 MED ORDER — VERAPAMIL HCL 2.5 MG/ML IV SOLN
INTRAVENOUS | Status: AC
  Filled 2017-12-28: qty 2

## 2017-12-28 MED ORDER — VERAPAMIL HCL 2.5 MG/ML IV SOLN
INTRAVENOUS | Status: DC | PRN
  Administered 2017-12-28: 10 mL via INTRA_ARTERIAL

## 2017-12-28 MED ORDER — SODIUM CHLORIDE 0.9 % IV SOLN
INTRAVENOUS | Status: AC
  Administered 2017-12-28: 16:00:00 via INTRAVENOUS

## 2017-12-28 MED ORDER — FENTANYL CITRATE (PF) 100 MCG/2ML IJ SOLN
INTRAMUSCULAR | Status: DC | PRN
  Administered 2017-12-28: 25 ug via INTRAVENOUS

## 2017-12-28 MED ORDER — SODIUM CHLORIDE 0.9% FLUSH: 3.0000 mL | Freq: Two times a day (BID) | INTRAVENOUS | Status: DC

## 2017-12-28 MED ORDER — IOPAMIDOL (ISOVUE-370) INJECTION 76%
INTRAVENOUS | Status: DC | PRN
  Administered 2017-12-28: 60 mL via INTRAVENOUS

## 2017-12-28 MED ORDER — SODIUM CHLORIDE 0.9 % IV SOLN: 250.0000 mL | INTRAVENOUS | Status: DC | PRN

## 2017-12-28 MED ORDER — HEPARIN (PORCINE) IN NACL 2-0.9 UNIT/ML-% IJ SOLN
INTRAMUSCULAR | Status: AC | PRN
  Administered 2017-12-28: 1000 mL

## 2017-12-28 MED ORDER — ACETAMINOPHEN 325 MG PO TABS
650.0000 mg | ORAL_TABLET | ORAL | Status: DC | PRN
  Administered 2017-12-28: 650 mg via ORAL

## 2017-12-28 MED ORDER — FENTANYL CITRATE (PF) 100 MCG/2ML IJ SOLN
INTRAMUSCULAR | Status: AC
  Filled 2017-12-28: qty 2

## 2017-12-28 MED ORDER — HEPARIN SODIUM (PORCINE) 1000 UNIT/ML IJ SOLN
INTRAMUSCULAR | Status: DC | PRN
Start: 2017-12-28 — End: 2017-12-28
  Administered 2017-12-28: 6000 [IU] via INTRAVENOUS

## 2017-12-28 MED ORDER — ASPIRIN 81 MG PO CHEW: 81.0000 mg | CHEWABLE_TABLET | ORAL | Status: DC

## 2017-12-28 MED ORDER — LIDOCAINE HCL (PF) 1 % IJ SOLN
INTRAMUSCULAR | Status: DC | PRN
  Administered 2017-12-28 (×2): 2 mL
  Administered 2017-12-28: 15 mL

## 2017-12-28 MED ORDER — IOPAMIDOL (ISOVUE-370) INJECTION 76%
INTRAVENOUS | Status: AC
  Filled 2017-12-28: qty 100

## 2017-12-28 MED ORDER — ACETAMINOPHEN 325 MG PO TABS
ORAL_TABLET | ORAL | Status: AC
  Administered 2017-12-28: 650 mg via ORAL
  Filled 2017-12-28: qty 2

## 2017-12-28 MED ORDER — MIDAZOLAM HCL 2 MG/2ML IJ SOLN
INTRAMUSCULAR | Status: DC | PRN
  Administered 2017-12-28: 1 mg via INTRAVENOUS

## 2017-12-28 SURGICAL SUPPLY — 14 items
CATH 5FR JL3.5 JR4 ANG PIG MP (CATHETERS) ×2 IMPLANT
CATH BALLN WEDGE 5F 110CM (CATHETERS) ×2 IMPLANT
CATH SWAN GANZ 7F STRAIGHT (CATHETERS) ×2 IMPLANT
DEVICE RAD COMP TR BAND LRG (VASCULAR PRODUCTS) ×2 IMPLANT
GLIDESHEATH SLEND SS 6F .021 (SHEATH) ×2 IMPLANT
GUIDEWIRE .025 260CM (WIRE) ×2 IMPLANT
GUIDEWIRE INQWIRE 1.5J.035X260 (WIRE) ×1 IMPLANT
INQWIRE 1.5J .035X260CM (WIRE) ×2
KIT HEART LEFT (KITS) ×2 IMPLANT
PACK CARDIAC CATHETERIZATION (CUSTOM PROCEDURE TRAY) ×2 IMPLANT
SHEATH GLIDE SLENDER 4/5FR (SHEATH) ×2 IMPLANT
SHEATH PINNACLE 7F 10CM (SHEATH) ×2 IMPLANT
TRANSDUCER W/STOPCOCK (MISCELLANEOUS) ×2 IMPLANT
TUBING CIL FLEX 10 FLL-RA (TUBING) ×2 IMPLANT

## 2017-12-28 NOTE — Interval H&P Note (Signed)
History and Physical Interval Note:  12/28/2017 2:32 PM  [name]  has presented today for surgery, with the diagnosis of hf  The various methods of treatment have been discussed with the patient and family. After consideration of risks, benefits and other options for treatment, the patient has consented to  Procedure(s): RIGHT/LEFT HEART CATH AND CORONARY ANGIOGRAPHY ([name]) as a surgical intervention .  The patient's history has been reviewed, patient examined, no change in status, stable for surgery.  I have reviewed the patient's chart and labs.  Questions were answered to the patient's satisfaction.     Ilo Beamon [employer]

## 2017-12-28 NOTE — Discharge Instructions (Signed)
Radial Site Care °Refer to this sheet in the next few weeks. These instructions provide you with information about caring for yourself after your procedure. Your health care provider may also give you more specific instructions. Your treatment has been planned according to current medical practices, but problems sometimes occur. Call your health care provider if you have any problems or questions after your procedure. °What can I expect after the procedure? °After your procedure, it is typical to have the following: °· Bruising at the radial site that usually fades within 1-2 weeks. °· Blood collecting in the tissue (hematoma) that may be painful to the touch. It should usually decrease in size and tenderness within 1-2 weeks. ° °Follow these instructions at home: °· Take medicines only as directed by your health care provider. °· You may shower 24-48 hours after the procedure or as directed by your health care provider. Remove the bandage (dressing) and gently wash the site with plain soap and water. Pat the area dry with a clean towel. Do not rub the site, because this may cause bleeding. °· Do not take baths, swim, or use a hot tub until your health care provider approves. °· Check your insertion site every day for redness, swelling, or drainage. °· Do not apply powder or lotion to the site. °· Do not flex or bend the affected arm for 24 hours or as directed by your health care provider. °· Do not push or pull heavy objects with the affected arm for 24 hours or as directed by your health care provider. °· Do not lift over 10 lb (4.5 kg) for 5 days after your procedure or as directed by your health care provider. °· Ask your health care provider when it is okay to: °? Return to work or school. °? Resume usual physical activities or sports. °? Resume sexual activity. °· Do not drive home if you are discharged the same day as the procedure. Have someone else drive you. °· You may drive 24 hours after the procedure  unless otherwise instructed by your health care provider. °· Do not operate machinery or power tools for 24 hours after the procedure. °· If your procedure was done as an outpatient procedure, which means that you went home the same day as your procedure, a responsible adult should be with you for the first 24 hours after you arrive home. °· Keep all follow-up visits as directed by your health care provider. This is important. °Contact a health care provider if: °· You have a fever. °· You have chills. °· You have increased bleeding from the radial site. Hold pressure on the site. °Get help right away if: °· You have unusual pain at the radial site. °· You have redness, warmth, or swelling at the radial site. °· You have drainage (other than a small amount of blood on the dressing) from the radial site. °· The radial site is bleeding, and the bleeding does not stop after 30 minutes of holding steady pressure on the site. °· Your arm or hand becomes pale, cool, tingly, or numb. °This information is not intended to replace advice given to you by your health care provider. Make sure you discuss any questions you have with your health care provider. °Document Released: 12/30/2010 Document Revised: 05/04/2016 Document Reviewed: 06/15/2014 °Elsevier Interactive Patient Education © 2018 Elsevier Inc[employer] ° ° ° °Moderate Conscious Sedation, Adult, Care After °These instructions provide you with information about caring for yourself after your procedure. Your health care provider   may also give you more specific instructions. Your treatment has been planned according to current medical practices, but problems sometimes occur. Call your health care provider if you have any problems or questions after your procedure. °What can I expect after the procedure? °After your procedure, it is common: °· To feel sleepy for several hours. °· To feel clumsy and have poor balance for several hours. °· To have poor judgment for several  hours. °· To vomit if you eat too soon. ° °Follow these instructions at home: °For at least 24 hours after the procedure: ° °· Do not: °? Participate in activities where you could fall or become injured. °? Drive. °? Use heavy machinery. °? Drink alcohol. °? Take sleeping pills or medicines that cause drowsiness. °? Make important decisions or sign legal documents. °? Take care of children on your own. °· Rest. °Eating and drinking °· Follow the diet recommended by your health care provider. °· If you vomit: °? Drink water, juice, or soup when you can drink without vomiting. °? Make sure you have little or no nausea before eating solid foods. °General instructions °· Have a responsible adult stay with you until you are awake and alert. °· Take over-the-counter and prescription medicines only as told by your health care provider. °· If you smoke, do not smoke without supervision. °· Keep all follow-up visits as told by your health care provider. This is important. °Contact a health care provider if: °· You keep feeling nauseous or you keep vomiting. °· You feel light-headed. °· You develop a rash. °· You have a fever. °Get help right away if: °· You have trouble breathing. °This information is not intended to replace advice given to you by your health care provider. Make sure you discuss any questions you have with your health care provider. °Document Released: 09/17/2013 Document Revised: 05/01/2016 Document Reviewed: 03/18/2016 °Elsevier Interactive Patient Education © 2018 Elsevier Inc. ° °

## 2017-12-31 ENCOUNTER — Encounter (HOSPITAL_COMMUNITY): Payer: Self-pay | Admitting: Cardiology

## 2018-01-01 ENCOUNTER — Telehealth: Payer: Self-pay | Admitting: *Deleted

## 2018-01-01 ENCOUNTER — Encounter: Payer: Self-pay | Admitting: Internal Medicine

## 2018-01-01 NOTE — Telephone Encounter (Signed)
CALLED PATIENT TO INFORM OF CT FOR 01-11-18 - ARRIVAL TIME - 12:15 PM, NO RESTRICTIONS TO TEST, TEST TO BE @ [facility], PT. TO HAVE FU WITH DR. KINARD ON 01-14-18 @ 4 PM FOR RESULTS, SPOKE WITH PATIENT'S WIFE - LINDA AND SHE IS AWARE OF THESE APPTS.

## 2018-01-03 DIAGNOSIS — J449 Chronic obstructive pulmonary disease, unspecified: Secondary | ICD-10-CM | POA: Diagnosis not present

## 2018-01-04 NOTE — Progress Notes (Signed)
Subjective:   [name] was seen in consultation in the movement disorder clinic at the request of [name].  Pts PCP is [name], MD.  The evaluation is for tremor and memory change.  This patient is accompanied in the office by his spouse who supplements the history. The records that were made available to me were reviewed.  Tremor started approximately 10 years ago and involves the UE equally.  Tremor is most noticeable when he writes or eats.  He cannot use a fork any longer.   There is a family hx of tremor in both sisters and his sisters daughter and patients daughter started to have tremor.  [name] asked the patient to decrease his amiodarone at 10/2017 appt to 100 mg daily but he didn't do that so he asked him to do that again at his 12/18/17 appt.  Affected by caffeine:  Unknown - cut out caffeine (1 cup coffee but its decaf) Affected by alcohol:  Cut out EtOH x 20 years ago Affected by stress:  No. Affected by fatigue:  No. Spills soup if on spoon:  Yes.  , uses straw for soup Spills glass of liquid if full:  Yes.   Affects ADL's (tying shoes, brushing teeth, etc):  Yes.   (trouble tying shoes; able to shave with blade)   Current medications that may exacerbate tremor:  Amiodarone (been on this for 10 years); albuterol  Outside reports reviewed: historical medical records, lab reports and office notes.  Memory is another c/o.    Living situation:  Pt lives with their spouse.  The patient does not do the finances in the home - wife has always done it.  The patient does drive but wife does most of driving.   The patient does not cook; wife does that.    ADL's:  The patient is able to perform hisown ADL's. The patient self medicates.  He prepares his own pill box.  The patients bladder and bowel are  under good control.   Behavior:   There have been no behavioral changes over the years.    Identified concerns:  Family and patient are concerned about short term  memory issues and ability to remember appointments/meeting. Pt states that he makes no effort to remember appointments/meetings because his wife will remember them and "I'm going whether I want to or not."  States that he knows that his wife will keep up to date with the meetings/appointments.  He reads 6 hours per day and is currently studying spanish and trying to learn that.    Allergies  Allergen Reactions  . Tussionex Pennkinetic Er [Hydrocod Polst-Cpm Polst Er] Other (See Comments)    UNSPECIFIED REACTION  > "caused prostate problems"  . Ace Inhibitors Cough    Outpatient Encounter Medications as of 01/07/2018  Medication Sig  . albuterol (PROVENTIL HFA;VENTOLIN HFA) 108 (90 BASE) MCG/ACT inhaler Inhale 2 puffs into the lungs every 6 (six) hours as needed for wheezing or shortness of breath.  Marland Kitchen albuterol (PROVENTIL) (2.5 MG/3ML) 0.083% nebulizer solution Take 3 mLs (2.5 mg total) by nebulization every 6 (six) hours. (Patient taking differently: Take 2.5 mg by nebulization every 6 (six) hours as needed for wheezing or shortness of breath. )  . amiodarone (PACERONE) 200 MG tablet Take 100 mg by mouth daily.  Marland Kitchen aspirin 81 MG tablet Take 81 mg by mouth daily.  Marland Kitchen atorvastatin (LIPITOR) 40 MG tablet TAKE 1 TABLET BY MOUTH  DAILY  .  carvedilol (COREG) 25 MG tablet TAKE 1 TABLET BY MOUTH  TWICE A DAY WITH MEALS  . clopidogrel (PLAVIX) 75 MG tablet TAKE 1 TABLET BY MOUTH  DAILY  . diazepam (VALIUM) 5 MG tablet 1-2 tabs every 8 hours if needed for tremor (Patient taking differently: Take 5-10 mg by mouth every 8 (eight) hours as needed (tremors). 1-2 tabs every 8 hours if needed for tremor)  . finasteride (PROSCAR) 5 MG tablet Take 5 mg by mouth daily.  . fluticasone (FLONASE) 50 MCG/ACT nasal spray Instill 1 spray in each  nostril daily  . Fluticasone-Umeclidin-Vilant (TRELEGY ELLIPTA) 100-62.5-25 MCG/INH AEPB Inhale 1 puff into the lungs daily.  . isosorbide mononitrate (IMDUR) 60 MG 24 hr  tablet Take one (1) tablet (60 mg) by mouth daily.  Marland Kitchen levothyroxine (SYNTHROID, LEVOTHROID) 50 MCG tablet TAKE 1 TABLET BY MOUTH  DAILY BEFORE BREAKFAST  . nitroGLYCERIN (NITROSTAT) 0.4 MG SL tablet DISSOLVE ONE TABLET UNDER  THE TONGUE EVERY 5 MINUTES  AS NEEDED FOR CHEST PAIN  (UP TO 3 TABLETS IN 15  MINUTES THEN CALL 911)  . NON FORMULARY at bedtime. CPAP And during the day as needed  . potassium chloride SA (K-DUR,KLOR-CON) 20 MEQ tablet Take 1 tablet (20 mEq total) by mouth daily.  . sacubitril-valsartan (ENTRESTO) 97-103 MG Take 1 tablet 2 (two) times daily by mouth.  . spironolactone (ALDACTONE) 25 MG tablet TAKE 1 TABLET BY MOUTH  DAILY  . torsemide (DEMADEX) 20 MG tablet Take 80mg  every morning. Take 40mg  every other evening.   No facility-administered encounter medications on file as of 01/07/2018.     Past Medical History:  Diagnosis Date  . AAA (abdominal aortic aneurysm) ([place])    followed by Dr. Bridgett Larsson  . Adenomatous colon polyp 01/2004  . Anemia    hx  . Automatic implantable cardioverter-defibrillator in situ   . AVM (arteriovenous malformation)   . BPH (benign prostatic hypertrophy)   . CAD (coronary artery disease)    s/p CABGx2 in 1996  . Carotid artery stenosis    LCEA - Dr. Bridgett Larsson in 2013  . CHF (congestive heart failure) ([place])   . Complication of anesthesia    claustrophobic, unabe to lie on back more than 4 hours at time due to back  . COPD (chronic obstructive pulmonary disease) ([place])   . Diabetes mellitus    DIET CONTROLLED- pt states that this was a misdiagnosis, he was treated while in the hospital  but returned home, loss a massive amount of weight and he has not had a problem with his blood sugar since. States everything has been normal for about 3 years.  . Diverticulosis   . Dyspnea   . Dysrhythmia    ICD-defibrillator  . Fatigue   . GERD (gastroesophageal reflux disease)   . H/O hiatal hernia   . History of radiation therapy 04/09/17-04/17/17   SBRT  right lung 54 Gy in 3 fractions  . HLD (hyperlipidemia)   . Hypertension   . Hypothyroidism   . Ischemic cardiomyopathy   . Myocardial infarction ([place])   . OSA on CPAP    AHI durign total sleep 14.69/hr, during REM 50.91/hr  . Peripheral vascular disease ([facility])    LCEA, L renal artery stent, bilat iliac stents, R SFA stenosis  . Tremor   . Ventricular tachycardia ([place]) 09/09/2014   Amiodarone was started after appropriate defibrillator shocks for ventricular tachycardia in October 2008    Past Surgical History:  Procedure Laterality Date  .  ABDOMINAL AORTIC ENDOVASCULAR STENT GRAFT [name] 01/06/2014   Procedure: ABDOMINAL AORTIC ENDOVASCULAR STENT GRAFT- GORE; ULTRASOUND GUIDED;  Surgeon: Conrad Rockham, MD;  Location: [facility];  Service: Vascular;  Laterality: [name];  . ANGIOPLASTY     BILATERAL  LE  W/STENTS  . BI-VENTRICULAR IMPLANTABLE CARDIOVERTER DEFIBRILLATOR UPGRADE [name] 10/09/2014   Procedure: BI-VENTRICULAR IMPLANTABLE CARDIOVERTER DEFIBRILLATOR UPGRADE;  Surgeon: Evans Lance, MD;  Location: [facility] C[facility];  Service: Cardiovascular;  Laterality: [name];  . BIV ICD GENERTAOR CHANGE OUT  10/09/2014   UPGRADE TO BIV        BY DR Lovena Le  . 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 Adora Fridge)  . CARDIAC CATHETERIZATION  10/03/2002   SVG sequentially to OM1 & OM2 totally occluded at ostium, SVG to PDA totally occluded within previously placed prox vein graft stent, smal distal AAA, bialt iliac stents with 30% left end-stent restenosis and 50% right end-stent restnosis(Dr. Gerrie Nordmann)  . CARDIAC CATHETERIZATION  06/11/1998   L main with 20% narrowing in distal 1/3; LAD with 1st diagonla having 70% ostial narrowing, 2nd diagonal with 40% narrowing in prox third, LIMA & RIMA widely patent; in-stent restenosis of RCIA  with successful PTA and new prox SVTRCA stent for residual disease (Dr. Marella Chimes)  . CARDIAC DEFIBRILLATOR PLACEMENT  06/2005   Guidant Vitality HE - ischemic cardiomyopathy - Dr. Marella Chimes  . CAROTID ENDARTERECTOMY Left 01/04/12  . CORONARY ANGIOPLASTY  01/08/2004   cutting balloon atherectomy & percutaneous intervention of RCIA in-stent restenosis (Dr. Marella Chimes)  . CORONARY ARTERY BYPASS GRAFT  11/04/1985   x2 - PDA and sequential DX-OM (Dr. Redmond Pulling)  . ENDARTERECTOMY  01/04/2012   Procedure: ENDARTERECTOMY CAROTID;left  Surgeon: Hinda Lenis, MD;  Location: [place];  Service: Vascular;  Laterality: Left;  with patch angioplasty  . FUDUCIAL PLACEMENT [name] 02/23/2017   Procedure: PLACEMENT OF FUDUCIAL;  Surgeon: [name], MD;  Location: [place];  Service: Thoracic;  Laterality: [name];  . ICD GENERATOR CHANGE  05/02/2010   [employer], [employer]  . ILIAC ARTERY STENT Bilateral 03/1997   and L SFA PTA  . Iron infusion  June 16, 2012  . LEFT HEART CATHETERIZATION WITH CORONARY ANGIOGRAM [name] 07/06/2014   Procedure: LEFT HEART CATHETERIZATION WITH CORONARY ANGIOGRAM;  Surgeon: Peter M Martinique, MD;  Location: [facility] CATH LAB;  Service: Cardiovascular;  Laterality: [name];  . NM MYOCAR PERF WALL MOTION  09/2012   lexiscan myoview - mod-severe perfusion defect r/t infarct or scar w/mild periinfarct ishcemia in basal inferior, mid inferior, apical inferior, basal inferolateral & mid inferoalteral regions - EF 21% low risk scan  . POLYPECTOMY    . RENAL ARTERY STENT Left 03/24/2004   6x59mm Genesis stent (Dr. Marella Chimes)  . RIGHT/LEFT HEART CATH AND CORONARY ANGIOGRAPHY [name] 12/28/2017   Procedure: RIGHT/LEFT HEART CATH AND CORONARY ANGIOGRAPHY;  Surgeon: Larey Dresser, MD;  Location: [place] CV LAB;  Service: Cardiovascular;  Laterality: [name];  . TRANSTHORACIC ECHOCARDIOGRAM  12/2012   EF 30-35; LV mod to severely dilated, mod concentric hypertrophy, severe hypokinesis of inferolateral  myocardium, moderate hypokineis of anteroseptal region, grade 1 diastolic dysfunction; mod MR; LA mod-severely dialted; RV mod dialted; RA mildly dilated; PA peak pressure 22mmHg  . VIDEO BRONCHOSCOPY WITH ENDOBRONCHIAL NAVIGATION [name] 02/23/2017   Procedure:  VIDEO BRONCHOSCOPY WITH ENDOBRONCHIAL NAVIGATION;  Surgeon: [name], MD;  Location: [facility];  Service: Thoracic;  Laterality: [name];    Social History   Socioeconomic History  . Marital status: Married    Spouse name: Not on file  . Number of children: 2  . Years of education: Not on file  . Highest education level: Not on file  Social Needs  . Financial resource strain: Not on file  . Food insecurity - worry: Not on file  . Food insecurity - inability: Not on file  . Transportation needs - medical: Not on file  . Transportation needs - non-medical: Not on file  Occupational History  . Occupation: retired Solicitor   Tobacco Use  . Smoking status: Former Smoker    Packs/day: 1.50    Years: 40.00    Pack years: 60.00    Types: Cigarettes, Pipe    Last attempt to quit: 12/11/2002    Years since quitting: 15.0  . Smokeless tobacco: Never Used  Substance and Sexual Activity  . Alcohol use: No    Alcohol/week: 0.0 oz  . Drug use: No  . Sexual activity: Not Currently  Other Topics Concern  . Not on file  Social History Narrative  . Not on file    Family Status  Relation Name Status  . Mother  Deceased  . Father  Deceased  . Sister  (Not Specified)  . Sister  (Not Specified)  . Brother  (Not Specified)  . MGM  Deceased  . Sister  (Not Specified)  . Sister  (Not Specified)  . Daughter  (Not Specified)  . MGF  Deceased  . PGM  Deceased  . PGF  Deceased    Review of Systems A complete 10 system ROS was obtained and was negative apart from what is mentioned.   Objective:   VITALS:   Vitals:   01/07/18 1000  BP: 108/64  Pulse: 68  SpO2: 93%  Weight: 276 lb (125.2 kg)  Height: 5\' 8"   (1.727 m)   Gen:  Appears stated age and in NAD. HEENT:  Normocephalic, atraumatic. The mucous membranes are moist. The superficial temporal arteries are without ropiness or tenderness. Cardiovascular: Regular rate and rhythm. Lungs: Clear to auscultation bilaterally. Neck: There are no carotid bruits noted bilaterally.  NEUROLOGICAL:  Orientation: some of MoCA affected by tremor Montreal Cognitive Assessment  01/07/2018  Visuospatial/ Executive (0/5) 0  Naming (0/3) 3  Attention: Read list of digits (0/2) 1  Attention: Read list of letters (0/1) 1  Attention: Serial 7 subtraction starting at 100 (0/3) 2  Language: Repeat phrase (0/2) 2  Language : Fluency (0/1) 1  Abstraction (0/2) 2  Delayed Recall (0/5) 3  Orientation (0/6) 6  Total 21  Adjusted Score (based on education) 21    Cranial nerves: There is good facial symmetry. The pupils are equal round and reactive to light bilaterally. Fundoscopic exam reveals clear disc margins bilaterally. Extraocular muscles are intact and visual fields are full to confrontational testing. Speech is fluent and clear. Soft palate rises symmetrically and there is no tongue deviation. Hearing is intact to conversational tone. Tone: Tone is good throughout. Sensation: Sensation is intact to light touch and pinprick throughout (facial, trunk, extremities). Vibration is intact at the bilateral big toe but slightly decreased. There is no extinction with double simultaneous stimulation. There is no sensory dermatomal level identified. Coordination:  The patient has no dysdiadichokinesia or dysmetria. Motor: Strength is 5/5  in the bilateral upper and lower extremities.  Shoulder shrug is equal bilaterally.  There is no pronator drift.  There are no fasciculations noted. DTR's: Deep tendon reflexes are 2/4 at the bilateral biceps, triceps, brachioradialis, patella and 1/4 at the bilateral achilles.  Plantar responses are downgoing bilaterally. Gait and  Station: The patient is able to ambulate without difficulty. The patient is able to heel toe walk without any difficulty. The patient is able to ambulate in a tandem fashion. The patient is able to stand in the Romberg position.   MOVEMENT EXAM: Tremor:  There is tremor in the UE, noted most significantly with action.  It is worse when the arms are held in the wing beating position.   The patient is not able to draw Archimedes spirals without significant difficulty.  There is mild tremor at rest bilaterally that is felt more than seen.  The patient is not able to pour water from one glass to another without spilling it and, in fact, cannot even pick the glass of water off of the counter without spilling it all over.   Lab Results  Component Value Date   VITAMINB12 243 04/18/2012   Lab Results  Component Value Date   TSH 2.854 10/22/2017     Chemistry      Component Value Date/Time   NA 140 12/18/2017 1500   NA 145 02/16/2015 1427   K 4.4 12/18/2017 1500   K 4.2 02/16/2015 1427   CL 107 12/18/2017 1500   CL 94 (L) 10/02/2012 0937   CO2 28 12/18/2017 1500   CO2 27 02/16/2015 1427   BUN 22 (H) 12/18/2017 1500   BUN 18.5 02/16/2015 1427   CREATININE 1.08 12/18/2017 1500   CREATININE 1.0 02/16/2015 1427      Component Value Date/Time   CALCIUM 9.1 12/18/2017 1500   CALCIUM 9.5 02/16/2015 1427   ALKPHOS 75 12/18/2017 1500   ALKPHOS 108 02/16/2015 1427   AST 23 12/18/2017 1500   AST 17 02/16/2015 1427   ALT 18 12/18/2017 1500   ALT 17 02/16/2015 1427   BILITOT 0.8 12/18/2017 1500   BILITOT 0.42 02/16/2015 1427          Assessment/Plan:   1.  Tremor  -I did tell the patient that while he likely has essential tremor, because he is also on amiodarone it would be difficult to rule out that as the cause of tremor, or at least a significant source of exacerbation of tremor.  His wife indicates that tremor started around the time as amiodarone was started.   I told the patient that  about 1/3 of patients who are on amiodarone will have some degree of tremor.  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   I did not advise the patient stop or alter the medication in any way, but did tell the patient that if the medication is stopped for any reason, it can take up to 6 months to know if the tremor was from the medication.  His cardiologist has decreased the medication recently.  I did tell the patient that medication really is not going to be helpful for this degree of tremor, which is severe.  We did talk about surgical interventions and I showed him HIPAA compliant videos of surgeries that we have done here.  I talked to the patient about the logistics associated with DBS therapy.  I talked  to the patient about risks/benefits/side effects of DBS therapy.  We talked about risks which included but were not limited to infection, paralysis, intraoperative seizure, death, stroke, bleeding around the electrode.   I talked to patient about fiducial placement 1 week prior to DBS therapy.  I talked to the patient about what to expect in the operating room, including the fact that this is an awake surgery.  We talked about battery placement as well as which is done under general anesthesia, generally approximately one week following the initial surgery.  We also talked about the fact that the patient will need to be off of medications for surgery.  The patient and family were given the opportunity to ask questions, which they did, and I answered them to the best of my ability today.  We discussed the fact that he would potentially be a high risk candidate given his cardiac issues and would need clearance from that respect.  He is very interested in the surgery, but I told him he needed to talk with his cardiologist first.  In the meantime, we decided to go ahead and schedule neurocognitive testing, as this is one of the first steps  in looking at DBS, but also because his wife is independently concerned about his memory and this is a test of memory.  2.  Mild B12 deficiency  -Patient had a mild B12 deficiency when this was last checked in 2013.  I will recheck again given complaints of memory change.  I am not convinced he has a dementia.  3.  Follow-up will depend on the results of the above.  Much greater than 50% of this visit was spent in counseling and coordinating care.  Total face to face time:  60 min   CC:  [name], MD

## 2018-01-07 ENCOUNTER — Other Ambulatory Visit: Payer: Medicare Other

## 2018-01-07 ENCOUNTER — Ambulatory Visit (INDEPENDENT_AMBULATORY_CARE_PROVIDER_SITE_OTHER): Payer: Medicare Other | Admitting: Neurology

## 2018-01-07 ENCOUNTER — Encounter: Payer: Self-pay | Admitting: Neurology

## 2018-01-07 VITALS — BP 108/64 | HR 68 | Ht 68.0 in | Wt 276.0 lb

## 2018-01-07 DIAGNOSIS — R413 Other amnesia: Secondary | ICD-10-CM

## 2018-01-07 DIAGNOSIS — I255 Ischemic cardiomyopathy: Secondary | ICD-10-CM

## 2018-01-07 DIAGNOSIS — R251 Tremor, unspecified: Secondary | ICD-10-CM

## 2018-01-07 DIAGNOSIS — E538 Deficiency of other specified B group vitamins: Secondary | ICD-10-CM

## 2018-01-07 NOTE — Patient Instructions (Signed)
1. Your provider has requested that you have labwork completed today. Please go to [facility] ([id number]) on the second floor of this building before leaving the office today. You do not need to check in. If you are not called within 15 minutes please check with the front desk.   2. You have been referred for a neurocognitive evaluation in our office.   The evaluation takes approximately two hours. The first part of the appointment is a clinical interview with the neuropsychologist (Dr. Macarthur Critchley). Please bring someone with you to this appointment if possible, as it is helpful for Dr. Si Raider to hear from both you and another adult who knows you well. After speaking with Dr. Si Raider, you will complete testing with her technician. The testing includes a variety of tasks- mostly question-and-answer, some paper-and-pencil. There is nothing you need to do to prepare for this appointment, but having a good night's sleep prior to the testing, and bringing eyeglasses and hearing aids (if you wear them), is advised.   About a week after the evaluation, you will return to follow up with Dr. Si Raider to review the test results. This appointment is about 30 minutes. If you would like a family member to receive this information as well, please bring them to the appointment.   We have to reserve several hours of the neuropsychologist's time and the psychometrician's time for your evaluation appointment. As such, please note that there is a No-Show fee of $100. If you are unable to attend any of your appointments, please contact our office as soon as possible to reschedule.  3. Talk to [name] to see if surgery would be an option for you.

## 2018-01-08 ENCOUNTER — Telehealth: Payer: Self-pay | Admitting: Neurology

## 2018-01-08 LAB — TIQ-NTM

## 2018-01-08 LAB — VITAMIN B12: Vitamin B-12: 304 pg/mL (ref 200–1100)

## 2018-01-08 NOTE — Telephone Encounter (Signed)
Received correspondence from [name] that pt would be appropriate candidate for DBS from cardiac standpoint (has PPM and defib) if we deemed necessary but is more limited by COPD and pulmonary condition.  Jade, let pt know that and know he needs f/u at some point with his pulmonologist to address that ([name]).

## 2018-01-09 ENCOUNTER — Telehealth: Payer: Self-pay | Admitting: Neurology

## 2018-01-09 ENCOUNTER — Other Ambulatory Visit: Payer: Self-pay | Admitting: Internal Medicine

## 2018-01-09 ENCOUNTER — Other Ambulatory Visit: Payer: Self-pay | Admitting: Cardiovascular Disease

## 2018-01-09 NOTE — Telephone Encounter (Signed)
-----   Message from Arena, DO sent at 01/08/2018  7:21 AM EST ----- Tell pt to take oral b12, 1049mcg daily

## 2018-01-09 NOTE — Telephone Encounter (Signed)
Patient's wife made aware. They have an appt scheduled with [name].

## 2018-01-09 NOTE — Telephone Encounter (Signed)
Mychart message sent to patient.

## 2018-01-09 NOTE — Telephone Encounter (Signed)
This is a CHF pt 

## 2018-01-11 ENCOUNTER — Encounter (HOSPITAL_COMMUNITY): Payer: Self-pay | Admitting: Cardiology

## 2018-01-11 ENCOUNTER — Ambulatory Visit (HOSPITAL_COMMUNITY)
Admission: RE | Admit: 2018-01-11 | Discharge: 2018-01-11 | Disposition: A | Discharge status: Home / Self Care | Payer: Medicare Other | Source: Ambulatory Visit | Attending: Radiation Oncology | Admitting: Radiation Oncology

## 2018-01-11 ENCOUNTER — Ambulatory Visit (HOSPITAL_COMMUNITY)
Admission: RE | Admit: 2018-01-11 | Discharge: 2018-01-11 | Disposition: A | Discharge status: Home / Self Care | Payer: Medicare Other | Source: Ambulatory Visit | Attending: Cardiology | Admitting: Cardiology

## 2018-01-11 ENCOUNTER — Other Ambulatory Visit: Payer: Self-pay

## 2018-01-11 ENCOUNTER — Other Ambulatory Visit: Payer: Self-pay | Admitting: *Deleted

## 2018-01-11 VITALS — BP 132/62 | HR 71 | Wt 269.5 lb

## 2018-01-11 DIAGNOSIS — G4733 Obstructive sleep apnea (adult) (pediatric): Secondary | ICD-10-CM | POA: Diagnosis not present

## 2018-01-11 DIAGNOSIS — J449 Chronic obstructive pulmonary disease, unspecified: Secondary | ICD-10-CM

## 2018-01-11 DIAGNOSIS — C3490 Malignant neoplasm of unspecified part of unspecified bronchus or lung: Secondary | ICD-10-CM | POA: Diagnosis not present

## 2018-01-11 DIAGNOSIS — I110 Hypertensive heart disease with heart failure: Secondary | ICD-10-CM | POA: Insufficient documentation

## 2018-01-11 DIAGNOSIS — N400 Enlarged prostate without lower urinary tract symptoms: Secondary | ICD-10-CM | POA: Diagnosis not present

## 2018-01-11 DIAGNOSIS — R911 Solitary pulmonary nodule: Secondary | ICD-10-CM

## 2018-01-11 DIAGNOSIS — D509 Iron deficiency anemia, unspecified: Secondary | ICD-10-CM | POA: Insufficient documentation

## 2018-01-11 DIAGNOSIS — R251 Tremor, unspecified: Secondary | ICD-10-CM | POA: Insufficient documentation

## 2018-01-11 DIAGNOSIS — Z95810 Presence of automatic (implantable) cardiac defibrillator: Secondary | ICD-10-CM | POA: Diagnosis not present

## 2018-01-11 DIAGNOSIS — I2510 Atherosclerotic heart disease of native coronary artery without angina pectoris: Secondary | ICD-10-CM

## 2018-01-11 DIAGNOSIS — Z951 Presence of aortocoronary bypass graft: Secondary | ICD-10-CM | POA: Insufficient documentation

## 2018-01-11 DIAGNOSIS — Z9981 Dependence on supplemental oxygen: Secondary | ICD-10-CM | POA: Insufficient documentation

## 2018-01-11 DIAGNOSIS — I5042 Chronic combined systolic (congestive) and diastolic (congestive) heart failure: Secondary | ICD-10-CM

## 2018-01-11 DIAGNOSIS — J439 Emphysema, unspecified: Secondary | ICD-10-CM | POA: Insufficient documentation

## 2018-01-11 DIAGNOSIS — I447 Left bundle-branch block, unspecified: Secondary | ICD-10-CM | POA: Insufficient documentation

## 2018-01-11 DIAGNOSIS — Z8249 Family history of ischemic heart disease and other diseases of the circulatory system: Secondary | ICD-10-CM | POA: Insufficient documentation

## 2018-01-11 DIAGNOSIS — I700 Atherosclerosis of aorta: Secondary | ICD-10-CM | POA: Diagnosis not present

## 2018-01-11 DIAGNOSIS — Z888 Allergy status to other drugs, medicaments and biological substances status: Secondary | ICD-10-CM | POA: Insufficient documentation

## 2018-01-11 DIAGNOSIS — I714 Abdominal aortic aneurysm, without rupture: Secondary | ICD-10-CM | POA: Diagnosis not present

## 2018-01-11 DIAGNOSIS — K449 Diaphragmatic hernia without obstruction or gangrene: Secondary | ICD-10-CM | POA: Diagnosis not present

## 2018-01-11 DIAGNOSIS — E785 Hyperlipidemia, unspecified: Secondary | ICD-10-CM | POA: Insufficient documentation

## 2018-01-11 DIAGNOSIS — Z8679 Personal history of other diseases of the circulatory system: Secondary | ICD-10-CM | POA: Insufficient documentation

## 2018-01-11 DIAGNOSIS — I739 Peripheral vascular disease, unspecified: Secondary | ICD-10-CM | POA: Diagnosis not present

## 2018-01-11 DIAGNOSIS — Z79899 Other long term (current) drug therapy: Secondary | ICD-10-CM | POA: Insufficient documentation

## 2018-01-11 DIAGNOSIS — K219 Gastro-esophageal reflux disease without esophagitis: Secondary | ICD-10-CM | POA: Diagnosis not present

## 2018-01-11 DIAGNOSIS — Z87891 Personal history of nicotine dependence: Secondary | ICD-10-CM | POA: Insufficient documentation

## 2018-01-11 DIAGNOSIS — Z7982 Long term (current) use of aspirin: Secondary | ICD-10-CM | POA: Insufficient documentation

## 2018-01-11 DIAGNOSIS — E039 Hypothyroidism, unspecified: Secondary | ICD-10-CM | POA: Insufficient documentation

## 2018-01-11 DIAGNOSIS — I252 Old myocardial infarction: Secondary | ICD-10-CM | POA: Insufficient documentation

## 2018-01-11 DIAGNOSIS — Z801 Family history of malignant neoplasm of trachea, bronchus and lung: Secondary | ICD-10-CM | POA: Insufficient documentation

## 2018-01-11 DIAGNOSIS — Z7901 Long term (current) use of anticoagulants: Secondary | ICD-10-CM | POA: Insufficient documentation

## 2018-01-11 DIAGNOSIS — I5022 Chronic systolic (congestive) heart failure: Secondary | ICD-10-CM | POA: Insufficient documentation

## 2018-01-11 DIAGNOSIS — E1151 Type 2 diabetes mellitus with diabetic peripheral angiopathy without gangrene: Secondary | ICD-10-CM | POA: Insufficient documentation

## 2018-01-11 DIAGNOSIS — I472 Ventricular tachycardia: Secondary | ICD-10-CM | POA: Insufficient documentation

## 2018-01-11 DIAGNOSIS — Z800 Family history of malignant neoplasm of digestive organs: Secondary | ICD-10-CM | POA: Insufficient documentation

## 2018-01-11 DIAGNOSIS — Z923 Personal history of irradiation: Secondary | ICD-10-CM | POA: Insufficient documentation

## 2018-01-11 DIAGNOSIS — I255 Ischemic cardiomyopathy: Secondary | ICD-10-CM | POA: Insufficient documentation

## 2018-01-11 DIAGNOSIS — Z833 Family history of diabetes mellitus: Secondary | ICD-10-CM | POA: Insufficient documentation

## 2018-01-11 MED ORDER — ISOSORBIDE MONONITRATE ER 60 MG PO TB24: ORAL_TABLET | ORAL | 0 refills | Status: DC

## 2018-01-11 NOTE — Patient Instructions (Signed)
Follow up in 74months with Dr.McLean

## 2018-01-12 NOTE — Progress Notes (Signed)
Patient ID: EMMERICH CRYER, male   DOB: 03-24-1946, 72 y.o.   MRN: [id number] [facility]  01/12/2018 TAYE CATO   1946/06/18  [id number]  Primary Physicia [name], MD Hematologist: Dr Alen Blew  Pulmonary: Dr Annamaria Boots Cardiology: [name]  HPI:  Mr. Gerhold is a pleasant 72 y.o. with a past medical history significant for morbid obesity, systolic heart failure with an EF of 30-35% (January 2014) -> 20-25% (March 2015 - RV normal), VT on amio, CAD s/p CABG 1996, LBBB, COPD, obstructive sleep apnea on CPAP, AAA repair (January 2015) who was admitted to [facility] on 02/24/14 for acute on chronic HF and acute COPD exacerbation. He also had iron deficiency anemia.   Admitted to [facility] July 24 through July 06 2014 with chest pain.  CEs negative. Had a LHC with no change from previous LHC with recommendations to continue medical management. Discharge weight was 255 pounds.   LHC 07/06/14 --No significant change from previous studies.  Left anterior descending (LAD): The LAD is a large vessel. There is a 40% stenosis immediately after the takeoff of a large septal perforator. There are 2 large diagonal branches without significant disease. Left circumflex (LCx): The LCx is occluded proximally. There are left to left collaterals.  Right coronary artery (RCA): The RCA is occluded proximally immediately following the conus branch. There are right to right and left to right collaterals. SVGs from CABG known to be totally occluded.   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   CTA chest/abd/pelvis (3/16) with moderate emphysema, 1.3 cm nodule RUL, left SFA totally occluded, right SFA with significant stenosis, s/p endovascular AAA repair (stable).   Echo (5/17) with EF 20-25%, severe LV dilation, mild MR, normal RV size with mildly decreased systolic function.   He has a RUL nodule that is most likely lung cancer, he is being treated with radiation.   Echo (8/18) with EF 30%, basal-mid  inferior and inferolateral AK, severe LV dilation, normal RV, mild MR.   RHC/LHC was done in 1/19 due to worsening exertional dyspnea.  This showed normal filling pressures and preserved cardiac output.  There was 30-40% mLAD stenosis.  The LAD provided collaterals to the LCx and RCA territories.  The LCx, RCA, SVG-OM, and SVG-PDA were all chronically occluded (known from the past).  No new disease.   Mr Stille returns for followup of CHF.  He recently saw neurology about his tremor.  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  However, he is on amiodarone which may be contributing.  He is now using oxygen at home.  This helps his breathing.  He is still short of breath walking more than about 100 feet but feel like oxygen improves this.  No orthopnea/PND.  No chest pain.  He gets short of breath dressing.    Labs 3/15 Cr 1.5 K 5.6 Labs 07/06/14 K 3.9 Creatinine  0.98 Labs 9/15 K 5.1, creatinine 0.96 Labs 11/15 K 3.8, creatinine 0.83, LDL 61, LFTs normal, TSH normal Labs 11/26/14 K 5.0 Creatinine 0.73 , LFTs normal, TSH normal,  Labs 12/08/14 K 3.4 Creatinine 0.83  Labs 3/16 K 4.4, creatinine 1.03, LFTs normal, TSH normal, HCT 37.8, BNP 65 Labs 6/16 K 4.1, creatinine 0.93, TSH normal, LFTs normal Labs 11/16 K 5.1, creatinine 1.13, LFTs normal, TSH normal, LDL 83, HDL 33, TGs 162 Labs 1/17 K 4.4, creatinine 0.86, pro-BNP 154 Labs 2/17 BNP 113, K 3.8, creatinine 0.79, TSH normal, LFTs normal Labs 4/17 K 3.8,  creatinine 0.91, HCT 48.1 Labs 6/17 K 3.5, creatinine 0.98 Labs 9/17 K 3.9, creatinine 0.79, LDL 69, HDL 29, TSH normal, LFTs normal Labs 3/18 K 3.6, creatinine 0.87 Labs 8/18 K 3.3 => 4.2, creatinine 0.78 => 1.04, LDL 66, HDL 38, BNP 166 Labs 9/18: K 4.7, creatinine 0.9, BNP 144 Labs 11/18: K 4.8, creatinine 1.05, TSH normal, LFTs normal.  Labs 1/19: K 4.4, creatinine 1.08, LFTs normal  ROS: All systems reviewed and negative except as per HPI.   Past Medical History:  Diagnosis Date   . AAA (abdominal aortic aneurysm) ([place])    followed by Dr. Bridgett Larsson  . Adenomatous colon polyp 01/2004  . Anemia    hx  . Automatic implantable cardioverter-defibrillator in situ   . AVM (arteriovenous malformation)   . BPH (benign prostatic hypertrophy)   . CAD (coronary artery disease)    s/p CABGx2 in 1996  . Carotid artery stenosis    LCEA - Dr. Bridgett Larsson in 2013  . CHF (congestive heart failure) ([place])   . Complication of anesthesia    claustrophobic, unabe to lie on back more than 4 hours at time due to back  . COPD (chronic obstructive pulmonary disease) ([place])   . Diabetes mellitus    DIET CONTROLLED- pt states that this was a misdiagnosis, he was treated while in the hospital  but returned home, loss a massive amount of weight and he has not had a problem with his blood sugar since. States everything has been normal for about 3 years.  . Diverticulosis   . Dyspnea   . Dysrhythmia    ICD-defibrillator  . Fatigue   . GERD (gastroesophageal reflux disease)   . H/O hiatal hernia   . History of radiation therapy 04/09/17-04/17/17   SBRT right lung 54 Gy in 3 fractions  . HLD (hyperlipidemia)   . Hypertension   . Hypothyroidism   . Ischemic cardiomyopathy   . Myocardial infarction ([place])   . OSA on CPAP    AHI durign total sleep 14.69/hr, during REM 50.91/hr  . Peripheral vascular disease ([facility])    LCEA, L renal artery stent, bilat iliac stents, R SFA stenosis  . Tremor   . Ventricular tachycardia ([place]) 09/09/2014   Amiodarone was started after appropriate defibrillator shocks for ventricular tachycardia in October 2008    Current Outpatient Medications  Medication Sig Dispense Refill  . albuterol (PROVENTIL HFA;VENTOLIN HFA) 108 (90 BASE) MCG/ACT inhaler Inhale 2 puffs into the lungs every 6 (six) hours as needed for wheezing or shortness of breath. 3 Inhaler 4  . albuterol (PROVENTIL) (2.5 MG/3ML) 0.083% nebulizer solution Take 3 mLs (2.5 mg total) by nebulization every 6 (six)  hours. 120 mL 3  . amiodarone (PACERONE) 200 MG tablet Take 100 mg by mouth daily.    Marland Kitchen aspirin 81 MG tablet Take 81 mg by mouth daily.    Marland Kitchen atorvastatin (LIPITOR) 40 MG tablet TAKE 1 TABLET BY MOUTH  DAILY 90 tablet 3  . carvedilol (COREG) 25 MG tablet TAKE 1 TABLET BY MOUTH  TWICE A DAY WITH MEALS 180 tablet 3  . clopidogrel (PLAVIX) 75 MG tablet TAKE 1 TABLET BY MOUTH  DAILY 90 tablet 3  . diazepam (VALIUM) 5 MG tablet 1-2 tabs every 8 hours if needed for tremor 30 tablet 5  . finasteride (PROSCAR) 5 MG tablet Take 5 mg by mouth daily.    . fluticasone (FLONASE) 50 MCG/ACT nasal spray Instill 1 spray in each  nostril daily 32 g  3  . Fluticasone-Umeclidin-Vilant (TRELEGY ELLIPTA) 100-62.5-25 MCG/INH AEPB Inhale 1 puff into the lungs daily. 30 each 5  . levothyroxine (SYNTHROID, LEVOTHROID) 50 MCG tablet TAKE 1 TABLET BY MOUTH  DAILY BEFORE BREAKFAST 90 tablet 1  . nitroGLYCERIN (NITROSTAT) 0.4 MG SL tablet DISSOLVE ONE TABLET UNDER  THE TONGUE EVERY 5 MINUTES  AS NEEDED FOR CHEST PAIN  (UP TO 3 TABLETS IN 15  MINUTES THEN CALL 911) 25 tablet 0  . NON FORMULARY at bedtime. CPAP And during the day as needed    . potassium chloride SA (K-DUR,KLOR-CON) 20 MEQ tablet Take 1 tablet (20 mEq total) by mouth daily. 90 tablet 3  . sacubitril-valsartan (ENTRESTO) 97-103 MG Take 1 tablet 2 (two) times daily by mouth. 180 tablet 3  . spironolactone (ALDACTONE) 25 MG tablet TAKE 1 TABLET BY MOUTH  DAILY 90 tablet 3  . torsemide (DEMADEX) 20 MG tablet Take 80mg  every morning. Take 40mg  every other evening. 540 tablet 3  . isosorbide mononitrate (IMDUR) 60 MG 24 hr tablet Take one (1) tablet (60 mg) by mouth daily. 90 tablet 0   No current facility-administered medications for this encounter.     Allergies  Allergen Reactions  . Tussionex Pennkinetic Er [Hydrocod Polst-Cpm Polst Er] Other (See Comments)    UNSPECIFIED REACTION  > "caused prostate problems"  . Ace Inhibitors Cough    Social History    Socioeconomic History  . Marital status: Married    Spouse name: Not on file  . Number of children: 2  . Years of education: Not on file  . Highest education level: Not on file  Social Needs  . Financial resource strain: Not on file  . Food insecurity - worry: Not on file  . Food insecurity - inability: Not on file  . Transportation needs - medical: Not on file  . Transportation needs - non-medical: Not on file  Occupational History  . Occupation: retired Solicitor   Tobacco Use  . Smoking status: Former Smoker    Packs/day: 1.50    Years: 40.00    Pack years: 60.00    Types: Cigarettes, Pipe    Last attempt to quit: 12/11/2002    Years since quitting: 15.0  . Smokeless tobacco: Never Used  Substance and Sexual Activity  . Alcohol use: No    Alcohol/week: 0.0 oz  . Drug use: No  . Sexual activity: Not Currently  Other Topics Concern  . Not on file  Social History Narrative  . Not on file    Family History  Problem Relation Age of Onset  . Lung cancer Mother   . Cancer Mother   . Diabetes Mother   . Hypertension Mother   . Hyperlipidemia Mother   . Heart disease Mother        before age 63  . Heart attack Father   . Heart disease Father        before age 37  . Hypertension Father   . Colon cancer Sister   . Cancer Sister   . Hypertension Sister   . Hyperlipidemia Sister   . Parkinson's disease Sister   . Diabetes Sister   . Heart disease Sister        before age 33  . Heart attack Maternal Grandmother   . Diabetes Daughter    Blood pressure 132/62, pulse 71, weight 269 lb 8 oz (122.2 kg), SpO2 97 %.   Wt Readings from Last 3 Encounters:  01/11/18 269  lb 8 oz (122.2 kg)  01/07/18 276 lb (125.2 kg)  12/28/17 261 lb (118.4 kg)    General: NAD Neck: No JVD, no thyromegaly or thyroid nodule.  Lungs: Clear to auscultation bilaterally with normal respiratory effort. CV: Nondisplaced PMI.  Heart regular S1/S2, no S3/S4, no murmur.  No  peripheral edema.  No carotid bruit.  Normal pedal pulses.  Abdomen: Soft, nontender, no hepatosplenomegaly, no distention.  Skin: Intact without lesions or rashes.  Neurologic: Alert and oriented x 3.  Psych: Normal affect. Extremities: No clubbing or cyanosis.  HEENT: Normal.   ASSESSMENT/PLAN:  1. Chronic systolic CHF: Ischemic cardiomyopathy. Last echo in 8/18 showed EF 30% with severe LV dilation and normal RV.  [employer] CRT-D.  RHC in 1/19 showed normal filling pressures and preserved cardiac output.  NYHA class III symptoms.  He is not volume overloaded on exam.  I think that his current symptoms are more related to COPD than CHF. - Continue torsemide 80 qam/40 every other pm.   - Continue carvedilol 25 mg twice a day  - Continue Entresto 97/103 bid.     - Continue spironolactone 25 mg daily.  2. CAD s/p CABG: No chest pain. [place] 1/19 with stable anatomy (LAD is patent, other vessels occluded with collaterals from the LAD). - Continue 81 mg aspirin daily and atorvastatin, good lipids 8/18. 3. VT s/p ICD: On amiodarone for history of ICD discharges due to VT.   - Recent LFTs and TSH normal. Knows he needs yearly eye exams.   - Given increased tremor, I cut back amiodarone to 100 mg daily. However, given multiple ICD discharges in the past, I do not want to stop it totally.  4. Morbid obesity: Still needs to work on diet/weight loss.  5. COPD: Moderate to severe.  I think that a lot of his symptoms are related to COPD.   - Needs followup with pulmonary. - Now on home oxygen.  6. Lung cancer: Has had radiation.  7. PAD: Occluded left SFA and significant right SFA stenosis on last CTA.  He does not report claudication and does not have pedal ulcers.  Followed by [facility]. He is on statin with optimized lipids.  8. AAA: s/p repair.  Followed at [facility].  No endoleak on last evaluation.  9. Tremor: Most likely essential tremor given family history.  However, cannot rule out role for  amiodarone.  I think that he needs to continue at least a low dose of amiodarone.  Dr. Carles Collet with neurology discussed deep brain stimulation.  I think he is stable from a cardiovascular standpoint to have this placed but would need to clear with pulmonary also given significant COPD.   Followup in 3 months.   [name] 01/12/2018

## 2018-01-14 ENCOUNTER — Ambulatory Visit
Admission: RE | Admit: 2018-01-14 | Discharge: 2018-01-14 | Disposition: A | Discharge status: Home / Self Care | Payer: Medicare Other | Source: Ambulatory Visit | Attending: Radiation Oncology | Admitting: Radiation Oncology

## 2018-01-14 ENCOUNTER — Other Ambulatory Visit: Payer: Self-pay

## 2018-01-14 DIAGNOSIS — R911 Solitary pulmonary nodule: Secondary | ICD-10-CM | POA: Insufficient documentation

## 2018-01-14 DIAGNOSIS — R0602 Shortness of breath: Secondary | ICD-10-CM | POA: Diagnosis not present

## 2018-01-14 DIAGNOSIS — Z9981 Dependence on supplemental oxygen: Secondary | ICD-10-CM | POA: Diagnosis not present

## 2018-01-14 DIAGNOSIS — Z85118 Personal history of other malignant neoplasm of bronchus and lung: Secondary | ICD-10-CM | POA: Insufficient documentation

## 2018-01-14 DIAGNOSIS — Z8709 Personal history of other diseases of the respiratory system: Secondary | ICD-10-CM | POA: Diagnosis not present

## 2018-01-14 DIAGNOSIS — J449 Chronic obstructive pulmonary disease, unspecified: Secondary | ICD-10-CM | POA: Insufficient documentation

## 2018-01-14 DIAGNOSIS — Z08 Encounter for follow-up examination after completed treatment for malignant neoplasm: Secondary | ICD-10-CM | POA: Diagnosis not present

## 2018-01-14 DIAGNOSIS — Z510 Encounter for antineoplastic radiation therapy: Secondary | ICD-10-CM | POA: Diagnosis not present

## 2018-01-14 NOTE — Progress Notes (Signed)
[name] is here for follow up.  He denies having any pain.  He does reports having constant shortness of breath and uses 2.8 L of oxygen at home.  He denies having a cough.  He reports feeling weak.  BP 110/63 (BP Location: Left Arm, Patient Position: Sitting)   Pulse 66   Temp 98.5 F (36.9 C) (Oral)   Ht 5\' 8"  (1.727 m)   Wt 272 lb (123.4 kg)   SpO2 95%   BMI 41.36 kg/m    Wt Readings from Last 3 Encounters:  01/14/18 272 lb (123.4 kg)  01/11/18 269 lb 8 oz (122.2 kg)  01/07/18 276 lb (125.2 kg)

## 2018-01-14 NOTE — Progress Notes (Signed)
Radiation Oncology         [phone] ________________________________  Name: [name] MRN: [id number]  Date: 01/14/2018  DOB: [birth year]  Follow-Up and reevaluation  Visit Note  CC: [name], MD  [name], *    ICD-10-CM   1. Nodule of right lung R91.1     Diagnosis:   PET positive 1.2 cm right upper lung nodule  Interval Since Last Radiation:  9 months  Narrative:  The patient returns today for routine follow-up.  He denies having any pain.  He does reports having constant shortness of breath and uses 2.8 L of oxygen at home.  He denies having a cough.  He reports feeling weak. [name] his pulmonologist did place the patient on oxygen recently which is been helpful for the patient at home. Patient is also on CPAP. Patient denies any pain in the chest area significant cough or hemoptysis. Patient had a routine chest CT scan performed recently and is now here for review of the scan and for further evaluation.      REVIEW OF SYSTEMS: A 10+ POINT REVIEW OF SYSTEMS WAS OBTAINED including neurology, dermatology, psychiatry, cardiac, respiratory, lymph, extremities, GI, GU, musculoskeletal, constitutional, reproductive, HEENT. All pertinent positives are noted in the HPI. All others are negative.                           ALLERGIES:  is allergic to tussionex pennkinetic er [hydrocod polst-cpm polst er] and ace inhibitors.  Meds: Current Outpatient Medications  Medication Sig Dispense Refill  . albuterol (PROVENTIL HFA;VENTOLIN HFA) 108 (90 BASE) MCG/ACT inhaler Inhale 2 puffs into the lungs every 6 (six) hours as needed for wheezing or shortness of breath. 3 Inhaler 4  . albuterol (PROVENTIL) (2.5 MG/3ML) 0.083% nebulizer solution Take 3 mLs (2.5 mg total) by nebulization every 6 (six) hours. 120 mL 3  . amiodarone (PACERONE) 200 MG tablet Take 100 mg by mouth daily.    Marland Kitchen aspirin 81 MG tablet Take 81 mg by mouth daily.    Marland Kitchen atorvastatin (LIPITOR) 40 MG  tablet TAKE 1 TABLET BY MOUTH  DAILY 90 tablet 3  . carvedilol (COREG) 25 MG tablet TAKE 1 TABLET BY MOUTH  TWICE A DAY WITH MEALS 180 tablet 3  . clopidogrel (PLAVIX) 75 MG tablet TAKE 1 TABLET BY MOUTH  DAILY 90 tablet 3  . diazepam (VALIUM) 5 MG tablet 1-2 tabs every 8 hours if needed for tremor 30 tablet 5  . finasteride (PROSCAR) 5 MG tablet Take 5 mg by mouth daily.    . fluticasone (FLONASE) 50 MCG/ACT nasal spray Instill 1 spray in each  nostril daily 32 g 3  . Fluticasone-Umeclidin-Vilant (TRELEGY ELLIPTA) 100-62.5-25 MCG/INH AEPB Inhale 1 puff into the lungs daily. 30 each 5  . isosorbide mononitrate (IMDUR) 60 MG 24 hr tablet Take one (1) tablet (60 mg) by mouth daily. 90 tablet 0  . levothyroxine (SYNTHROID, LEVOTHROID) 50 MCG tablet TAKE 1 TABLET BY MOUTH  DAILY BEFORE BREAKFAST 90 tablet 1  . nitroGLYCERIN (NITROSTAT) 0.4 MG SL tablet DISSOLVE ONE TABLET UNDER  THE TONGUE EVERY 5 MINUTES  AS NEEDED FOR CHEST PAIN  (UP TO 3 TABLETS IN 15  MINUTES THEN CALL 911) 25 tablet 0  . NON FORMULARY at bedtime. CPAP And during the day as needed    . potassium chloride SA (K-DUR,KLOR-CON) 20 MEQ tablet Take 1 tablet (20 mEq total) by mouth  daily. 90 tablet 3  . sacubitril-valsartan (ENTRESTO) 97-103 MG Take 1 tablet 2 (two) times daily by mouth. 180 tablet 3  . spironolactone (ALDACTONE) 25 MG tablet TAKE 1 TABLET BY MOUTH  DAILY 90 tablet 3  . torsemide (DEMADEX) 20 MG tablet Take 80mg  every morning. Take 40mg  every other evening. 540 tablet 3   No current facility-administered medications for this encounter.     Physical Findings: The patient is in no acute distress. Patient is alert and oriented.  height is 5\' 8"  (1.727 m) and weight is 272 lb (123.4 kg). His oral temperature is 98.5 F (36.9 C). His blood pressure is 110/63 and his pulse is 66. His oxygen saturation is 95%. .    Lungs are clear to auscultation bilaterally. Heart has regular rate and rhythm. No palpable cervical,  supraclavicular, or axillary adenopathy. Abdomen soft, non-tender, normal bowel sounds.   Lab Findings: Lab Results  Component Value Date   WBC 6.8 12/28/2017   HGB 14.9 12/28/2017   HCT 45.9 12/28/2017   MCV 96.8 12/28/2017   PLT 135 (L) 12/28/2017    Radiographic Findings: Dg Chest 2 View  Result Date: 12/19/2017 CLINICAL DATA:  Shortness of breath. Congestive heart failure. COPD. 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 EXAM: CHEST  2 VIEW COMPARISON:  02/21/2017 FINDINGS: The heart size and mediastinal contours are within normal limits. AICD remains in appropriate position. Previous CABG again noted. Increased scarring is seen in the right upper lobe with adjacent fiducial markers. Previously seen airspace opacity in the lateral right lung base has resolved. No evidence of acute infiltrate or edema. No evidence of pleural effusion. IMPRESSION: Increased right upper lobe scarring since prior exam. No active disease. Electronically Signed   By: Earle Gell M.D.   On: 12/19/2017 08:35   Ct Chest Wo Contrast  Result Date: 01/11/2018 CLINICAL DATA:  Lung cancer diagnosed in 2016 with radiation therapy completed 4-5 months ago. Non-small cell, stage 1-2. Right-sided primary. EXAM: CT CHEST WITHOUT CONTRAST TECHNIQUE: Multidetector CT imaging of the chest was performed following the standard protocol without IV contrast. COMPARISON:  Plain films 12/18/2017.  CT 09/26/2017. FINDINGS: Cardiovascular: Pacer/AICD device. Aortic and branch vessel atherosclerosis. Mild cardiomegaly, without pericardial effusion. Prior median sternotomy for CABG. Mediastinum/Nodes: No supraclavicular adenopathy. No mediastinal or definite hilar adenopathy, given limitations of unenhanced CT. Lungs/Pleura: No pleural fluid. Minimal motion degradation inferiorly. Moderate centrilobular emphysema. Progressive radiation change within the lateral right upper lobe, as evidenced by mild septal thickening and  ground-glass opacity. Lateral right upper lobe pulmonary nodule is unchanged at 1.0 cm on image 48/series 5. The more medial right upper lobe nodule measures 11x9 mm on image 55/series 5 and is similar to on the prior exam (when remeasured). Anterior right lower lobe pulmonary nodule measures 10 mm image 100/series 5 versus 2 mm on the prior exam. Tiny subpleural right lower lobe pulmonary nodules for are unchanged, including at 4 mm image 48/series 5. Lingular volume loss and subsegmental atelectasis. Posterior left upper lobe pulmonary nodule measures 8 mm image 32/series 5 versus 7 mm the prior. Upper Abdomen: Normal imaged portions of the liver, spleen, stomach, pancreas, adrenal glands, kidneys. Renal vascular calcification. Incompletely imaged abdominal aortic stent graft. Musculoskeletal: Mild thoracic spondylosis. No suspicious focal osseous lesion. IMPRESSION: 1. Progressive radiation change within the right upper lobe with similar size of 2 right upper lobe pulmonary nodules. 2. Significant enlargement of a right lower lobe pulmonary nodule. Minimal enlargement of a posterior left upper  lobe pulmonary nodule. Findings could represent metastatic disease or metachronous primaries. Other pulmonary nodules are not significantly changed. 3. No thoracic adenopathy. 4.  Aortic Atherosclerosis (ICD10-I70.0).  Emphysema (ICD10-J43.9). Electronically Signed   By: Abigail Miyamoto M.D.   On: 01/11/2018 13:58    Impression:   PET positive 1.2 cm right upper lung nodule. Patient had a chest CT scan late last week which shows stability of this right upper lobe nodules that were treated with stereotactic radiosurgery. Patient however was found to have a nodule in the right lower lobe which had increased significantly in size from 2 mm to 10 mm. Patient was also noted to have a fairly stable left upper lobe nodule. I reviewed the CT scan report with the patient and his family. I also showed the patient's most recent chest  CT images. Options for management would be to repeat the chest CT scan in 3 months or to proceed with stereotactic radiation surgery directed at the rapidly enlarging right lower lobe nodule. Given the size the lesion I doubt PET scanning would be very helpful for further evaluation. After careful consideration the patient would like to proceed with SBRT rather then repeating a scan in 3 months. I discussed the course of treatment side effects and potential toxicities of radiation therapy in this situation with the patient his family. Patient understands this well as he is been through a previous course of SBRT.  Plan:  Patient will be scheduled for stereotactic body radiation therapy planning later this week. Anticipate between 3 and 5 SBRT treatments directed at the right lower lobe pulmonary nodule.  ____________________________________  Blair Promise, PhD, MD  This document serves as a record of services personally performed by Blair Promise, PhD, MD. It was created on his behalf by [name], a trained medical scribe. The creation of this record is based on the scribe's personal observations and the provider's statements to them.

## 2018-01-21 ENCOUNTER — Ambulatory Visit
Admission: RE | Admit: 2018-01-21 | Discharge: 2018-01-21 | Disposition: A | Discharge status: Home / Self Care | Payer: Medicare Other | Source: Ambulatory Visit | Attending: Radiation Oncology | Admitting: Radiation Oncology

## 2018-01-21 DIAGNOSIS — R911 Solitary pulmonary nodule: Secondary | ICD-10-CM

## 2018-01-21 DIAGNOSIS — Z510 Encounter for antineoplastic radiation therapy: Secondary | ICD-10-CM | POA: Diagnosis not present

## 2018-01-21 DIAGNOSIS — J449 Chronic obstructive pulmonary disease, unspecified: Secondary | ICD-10-CM | POA: Diagnosis not present

## 2018-01-21 DIAGNOSIS — R0602 Shortness of breath: Secondary | ICD-10-CM | POA: Diagnosis not present

## 2018-01-21 DIAGNOSIS — Z85118 Personal history of other malignant neoplasm of bronchus and lung: Secondary | ICD-10-CM | POA: Diagnosis not present

## 2018-01-21 NOTE — Progress Notes (Signed)
  Radiation Oncology         [phone] ________________________________  Name: [name] MRN: [id number]  Date: 01/21/2018  DOB: August 25, 1946   STEREOTACTIC BODY RADIOTHERAPY SIMULATION AND TREATMENT PLANNING NOTE   DIAGNOSIS:  right lower lobe pulmonary nodule  NARRATIVE:  The patient was brought to the [facility].  Identity was confirmed.  All relevant records and images related to the planned course of therapy were reviewed.  The patient freely provided informed written consent to proceed with treatment after reviewing the details related to the planned course of therapy. The consent form was witnessed and verified by the simulation staff.  Then, the patient was set-up in a stable reproducible  supine position for radiation therapy.  A BodyFix immobilization pillow was fabricated for reproducible positioning.  Then I personally applied the abdominal compression paddle to limit respiratory excursion.  4D respiratoy motion management CT images were obtained.  Surface markings were placed.  The CT images were loaded into the planning software.  Then, using Cine, MIP, and standard views, the internal target volume (ITV) and planning target volumes (PTV) were delinieated, and avoidance structures were contoured.  Treatment planning then occurred.  The radiation prescription was entered and confirmed.  A total of two complex treatment devices were fabricated in the form of the BodyFix immobilization pillow and a neck accuform cushion.  I have requested : 3D Simulation  I have requested a DVH of the following structures: Heart, Lungs, Esophagus, Chest Wall, Brachial Plexus, Major Blood Vessels, and targets.  PLAN:  The patient will receive 54 Gy in 3 fractions.  -----------------------------------  Blair Promise, PhD, MD  This document serves as a record of services personally performed by Gery Pray, MD. It was created on his behalf by [facility], a trained medical  scribe. The creation of this record is based on the scribe's personal observations and the provider's statements to them. This document has been checked and approved by the attending provider.

## 2018-01-28 DIAGNOSIS — R911 Solitary pulmonary nodule: Secondary | ICD-10-CM | POA: Diagnosis not present

## 2018-01-28 DIAGNOSIS — Z510 Encounter for antineoplastic radiation therapy: Secondary | ICD-10-CM | POA: Diagnosis not present

## 2018-01-28 DIAGNOSIS — R0602 Shortness of breath: Secondary | ICD-10-CM | POA: Diagnosis not present

## 2018-01-28 DIAGNOSIS — Z85118 Personal history of other malignant neoplasm of bronchus and lung: Secondary | ICD-10-CM | POA: Diagnosis not present

## 2018-01-28 DIAGNOSIS — J449 Chronic obstructive pulmonary disease, unspecified: Secondary | ICD-10-CM | POA: Diagnosis not present

## 2018-02-04 ENCOUNTER — Ambulatory Visit
Admission: RE | Admit: 2018-02-04 | Discharge: 2018-02-04 | Disposition: A | Discharge status: Home / Self Care | Payer: Medicare Other | Source: Ambulatory Visit | Attending: Radiation Oncology | Admitting: Radiation Oncology

## 2018-02-04 DIAGNOSIS — J449 Chronic obstructive pulmonary disease, unspecified: Secondary | ICD-10-CM | POA: Diagnosis not present

## 2018-02-04 DIAGNOSIS — Z510 Encounter for antineoplastic radiation therapy: Secondary | ICD-10-CM | POA: Diagnosis not present

## 2018-02-04 DIAGNOSIS — R911 Solitary pulmonary nodule: Secondary | ICD-10-CM | POA: Diagnosis not present

## 2018-02-04 DIAGNOSIS — Z85118 Personal history of other malignant neoplasm of bronchus and lung: Secondary | ICD-10-CM | POA: Diagnosis not present

## 2018-02-04 DIAGNOSIS — R0602 Shortness of breath: Secondary | ICD-10-CM | POA: Diagnosis not present

## 2018-02-04 NOTE — Progress Notes (Signed)
Established EVAR   History of Present Illness   NATHANAL HERMIZ is a 72 y.o. ([birth year]) male who presents for routine follow up s/p EVAR (Date: 01/06/14).  Most recent EVAR duplex (Date: 10/06/16) demonstrates: no endoleak and ? Increased sac size.  Most recent CTA (Date: 07/20/17) demonstrates: no endoleak and slightly decreased sac size.  The patient has never had back or abdominal pain.  Since his prior visit with myself, the patient has been diagnosed with lung cancer and is getting XRT for such.  Pt is tolerating his XRT.  In regards to his legs, the patient has limited L leg intermittent claudication.  He doesn't feel this interferes with his ADL.  He also notes nocturnal cramps in left calf.  His sx are not c/w rest pain  The patient's PMH, PSH, SH, and FamHx are unchanged from 10/06/16.  Current Outpatient Medications  Medication Sig Dispense Refill  . albuterol (PROVENTIL HFA;VENTOLIN HFA) 108 (90 BASE) MCG/ACT inhaler Inhale 2 puffs into the lungs every 6 (six) hours as needed for wheezing or shortness of breath. 3 Inhaler 4  . albuterol (PROVENTIL) (2.5 MG/3ML) 0.083% nebulizer solution Take 3 mLs (2.5 mg total) by nebulization every 6 (six) hours. 120 mL 3  . amiodarone (PACERONE) 200 MG tablet Take 100 mg by mouth daily.    Marland Kitchen aspirin 81 MG tablet Take 81 mg by mouth daily.    Marland Kitchen atorvastatin (LIPITOR) 40 MG tablet TAKE 1 TABLET BY MOUTH  DAILY 90 tablet 3  . carvedilol (COREG) 25 MG tablet TAKE 1 TABLET BY MOUTH  TWICE A DAY WITH MEALS 180 tablet 3  . clopidogrel (PLAVIX) 75 MG tablet TAKE 1 TABLET BY MOUTH  DAILY 90 tablet 3  . diazepam (VALIUM) 5 MG tablet 1-2 tabs every 8 hours if needed for tremor 30 tablet 5  . finasteride (PROSCAR) 5 MG tablet Take 5 mg by mouth daily.    . fluticasone (FLONASE) 50 MCG/ACT nasal spray Instill 1 spray in each  nostril daily 32 g 3  . Fluticasone-Umeclidin-Vilant (TRELEGY ELLIPTA) 100-62.5-25 MCG/INH AEPB Inhale 1 puff into the  lungs daily. 30 each 5  . isosorbide mononitrate (IMDUR) 60 MG 24 hr tablet Take one (1) tablet (60 mg) by mouth daily. 90 tablet 0  . levothyroxine (SYNTHROID, LEVOTHROID) 50 MCG tablet TAKE 1 TABLET BY MOUTH  DAILY BEFORE BREAKFAST 90 tablet 1  . nitroGLYCERIN (NITROSTAT) 0.4 MG SL tablet DISSOLVE ONE TABLET UNDER  THE TONGUE EVERY 5 MINUTES  AS NEEDED FOR CHEST PAIN  (UP TO 3 TABLETS IN 15  MINUTES THEN CALL 911) 25 tablet 0  . NON FORMULARY at bedtime. CPAP And during the day as needed    . potassium chloride SA (K-DUR,KLOR-CON) 20 MEQ tablet Take 1 tablet (20 mEq total) by mouth daily. 90 tablet 3  . sacubitril-valsartan (ENTRESTO) 97-103 MG Take 1 tablet 2 (two) times daily by mouth. 180 tablet 3  . spironolactone (ALDACTONE) 25 MG tablet TAKE 1 TABLET BY MOUTH  DAILY 90 tablet 3  . torsemide (DEMADEX) 20 MG tablet Take 80mg  every morning. Take 40mg  every other evening. 540 tablet 3   No current facility-administered medications for this visit.     On ROS today: XRT for lung cancer, minimal intermittent claudication    Physical Examination   Vitals:   02/06/18 0855  BP: 127/67  Pulse: 72  Resp: 20  SpO2: 96%  Weight: 277 lb (125.6 kg)  Height: 5\' 8"  (1.727 m)  Body mass index is 42.12 kg/m.  General Alert, O x 3, Obese, Ill appearing  Pulmonary Sym exp, Decreased B air movt, rales on BLL, dyspneic  Cardiac RRR, Nl S1, S2, no Murmurs, No rubs, +S3, no S4  Vascular Vessel Right Left  Radial Palpable Palpable  Brachial Palpable Palpable  Carotid Palpable, No Bruit Palpable, No Bruit  Aorta Not palpable [name]  Femoral Not palpable due to positioning Not palpable due to positioning  Popliteal Not palpable Not palpable  PT Not palpable Not palpable  DP Not palpable Not palpable    Gastro- intestinal soft, non-distended, non-tender to palpation, No guarding or rebound, no HSM, no masses, no CVAT B, No palpable prominent aortic pulse,    Musculo- skeletal M/S 5/5  throughout  , Extremities without ischemic changes, B feet pink without wounds or gangrene  Neurologic Pain and light touch intact in extremities , Motor exam as listed above    Non-invasive Vascular Imaging   ABI (02/06/2018)  ABI Findings: Right Rt Pressure (mmHg) Index Waveform Comment   Brachial 122     ATA 96 0.74 monophasic    PTA 100 0.78 monophasic    Great Toe 79 0.61     Left Lt Pressure (mmHg) Index Waveform Comment  Brachial 129     ATA 68 0.53 monophasic    PTA 76 0.59 monophasic    Great Toe 63 0.49     ABI/TBI Today's ABI Today's TBI Previous ABI Previous TBI  Right 0.78  0.61  1.04  0.97   Left 0.59  0.49  0.77  1.04      Bilateral ABIs and TBIs appear decreased.   Final Interpretation: Right: Resting right ankle-brachial index indicates moderate right lower extremity arterial disease. RT great toe pressure = 79 mmHg. Left: Resting left ankle-brachial index indicates moderate left lower extremity arterial disease. LT Great toe pressure = 63 mmHg.   EVAR Duplex (02/06/2018) Abdominal Aorta: The largest aortic measurement is 5.0 cm. The largest aortic diameter remains essentially unchanged compared to prior exam. Previous diameter measurement was 5.0 cm obtained on CT on 07/20/17.  All velocities obtained in the left iliac arteries are within the >50% stenosis range.     Medical Decision Making   KENNETT SYMES is a 72 y.o. (08/28/1946) male who presents s/p EVAR (01/06/14), acute on chronic heart failure, Lung cancer, BLE mod PAD   Pt's primary issue is his Lung cancer at this point.  His EVAR duplex demonstrates stable size today without any obvious endoleak.  The velocities in the limbs look acceptable.  In regards to his PAD, would medical manage given the bigger issue noted above.  Thank you for allowing [place] to participate in this patient's care.   Adele Barthel, MD, FACS [facility] Office: [phone] Pager:  [phone]

## 2018-02-04 NOTE — Progress Notes (Signed)
  Radiation Oncology         [phone] ________________________________  Name: [name] MRN: [id number]  Date: 02/04/2018  DOB: [birth year]  Stereotactic Body Radiotherapy Treatment Procedure Note  NARRATIVE:  [name] was brought to the stereotactic radiation treatment machine and placed supine on the CT couch. The patient was set up for stereotactic body radiotherapy on the body fix pillow.  3D TREATMENT PLANNING AND DOSIMETRY:  The patient's radiation plan was reviewed and approved prior to starting treatment.  It showed 3-dimensional radiation distributions overlaid onto the planning CT.  The [facility] for the target structures as well as the organs at risk were reviewed. The documentation of this is filed in the radiation oncology EMR.  SIMULATION VERIFICATION:  The patient underwent CT imaging on the treatment unit.  These were carefully aligned to document that the ablative radiation dose would cover the target volume and maximally spare the nearby organs at risk according to the planned distribution.  SPECIAL TREATMENT PROCEDURE: [name] received high dose ablative stereotactic body radiotherapy to the planned target volume without unforeseen complications. Treatment was delivered uneventfully. The high doses associated with stereotactic body radiotherapy and the significant potential risks require careful treatment set up and patient monitoring constituting a special treatment procedure   STEREOTACTIC TREATMENT MANAGEMENT:  Following delivery, the patient was evaluated clinically. The patient tolerated treatment without significant acute effects, and was discharged to home in stable condition.    PLAN: Continue treatment as planned.  ________________________________  Blair Promise, PhD, MD

## 2018-02-05 ENCOUNTER — Ambulatory Visit: Payer: Medicare Other | Admitting: Radiation Oncology

## 2018-02-06 ENCOUNTER — Encounter: Payer: Self-pay | Admitting: Vascular Surgery

## 2018-02-06 ENCOUNTER — Ambulatory Visit (HOSPITAL_COMMUNITY)
Admission: RE | Admit: 2018-02-06 | Discharge: 2018-02-06 | Disposition: A | Discharge status: Home / Self Care | Payer: Medicare Other | Source: Ambulatory Visit | Attending: Vascular Surgery | Admitting: Vascular Surgery

## 2018-02-06 ENCOUNTER — Ambulatory Visit (INDEPENDENT_AMBULATORY_CARE_PROVIDER_SITE_OTHER): Payer: Medicare Other | Admitting: Vascular Surgery

## 2018-02-06 ENCOUNTER — Ambulatory Visit
Admission: RE | Admit: 2018-02-06 | Discharge: 2018-02-06 | Disposition: A | Discharge status: Home / Self Care | Payer: Medicare Other | Source: Ambulatory Visit | Attending: Radiation Oncology | Admitting: Radiation Oncology

## 2018-02-06 VITALS — BP 127/67 | HR 72 | Resp 20 | Ht 68.0 in | Wt 277.0 lb

## 2018-02-06 DIAGNOSIS — J449 Chronic obstructive pulmonary disease, unspecified: Secondary | ICD-10-CM | POA: Diagnosis not present

## 2018-02-06 DIAGNOSIS — I255 Ischemic cardiomyopathy: Secondary | ICD-10-CM | POA: Diagnosis not present

## 2018-02-06 DIAGNOSIS — Z510 Encounter for antineoplastic radiation therapy: Secondary | ICD-10-CM | POA: Diagnosis not present

## 2018-02-06 DIAGNOSIS — I70213 Atherosclerosis of native arteries of extremities with intermittent claudication, bilateral legs: Secondary | ICD-10-CM

## 2018-02-06 DIAGNOSIS — I714 Abdominal aortic aneurysm, without rupture, unspecified: Secondary | ICD-10-CM

## 2018-02-06 DIAGNOSIS — R0602 Shortness of breath: Secondary | ICD-10-CM | POA: Diagnosis not present

## 2018-02-06 DIAGNOSIS — R911 Solitary pulmonary nodule: Secondary | ICD-10-CM | POA: Diagnosis not present

## 2018-02-06 DIAGNOSIS — I739 Peripheral vascular disease, unspecified: Secondary | ICD-10-CM | POA: Insufficient documentation

## 2018-02-06 DIAGNOSIS — I70219 Atherosclerosis of native arteries of extremities with intermittent claudication, unspecified extremity: Secondary | ICD-10-CM | POA: Insufficient documentation

## 2018-02-06 DIAGNOSIS — I771 Stricture of artery: Secondary | ICD-10-CM | POA: Insufficient documentation

## 2018-02-06 DIAGNOSIS — Z85118 Personal history of other malignant neoplasm of bronchus and lung: Secondary | ICD-10-CM | POA: Diagnosis not present

## 2018-02-06 NOTE — Progress Notes (Signed)
  Radiation Oncology         [phone] ________________________________  Name: [name] MRN: [id number]  Date: 02/06/2018  DOB: 26-Feb-1946  Stereotactic Body Radiotherapy Treatment Procedure Note  NARRATIVE:  KRISTAN VOTTA was brought to the stereotactic radiation treatment machine and placed supine on the CT couch. The patient was set up for stereotactic body radiotherapy on the body fix pillow.  3D TREATMENT PLANNING AND DOSIMETRY:  The patient's radiation plan was reviewed and approved prior to starting treatment.  It showed 3-dimensional radiation distributions overlaid onto the planning CT.  The [facility] for the target structures as well as the organs at risk were reviewed. The documentation of this is filed in the radiation oncology EMR.  SIMULATION VERIFICATION:  The patient underwent CT imaging on the treatment unit.  These were carefully aligned to document that the ablative radiation dose would cover the target volume and maximally spare the nearby organs at risk according to the planned distribution.  SPECIAL TREATMENT PROCEDURE: [name] received high dose ablative stereotactic body radiotherapy to the planned target volume without unforeseen complications. Treatment was delivered uneventfully. The high doses associated with stereotactic body radiotherapy and the significant potential risks require careful treatment set up and patient monitoring constituting a special treatment procedure   STEREOTACTIC TREATMENT MANAGEMENT:  Following delivery, the patient was evaluated clinically. The patient tolerated treatment without significant acute effects, and was discharged to home in stable condition.    PLAN: Continue treatment as planned.  ________________________________  Blair Promise, PhD, MD   This document serves as a record of services personally performed by Blair Promise, PhD, MD. It was created on his behalf by [name], a trained medical scribe. The  creation of this record is based on the scribe's personal observations and the provider's statements to them.

## 2018-02-07 ENCOUNTER — Encounter: Payer: Self-pay | Admitting: Internal Medicine

## 2018-02-07 ENCOUNTER — Ambulatory Visit (INDEPENDENT_AMBULATORY_CARE_PROVIDER_SITE_OTHER): Payer: Medicare Other | Admitting: Internal Medicine

## 2018-02-07 ENCOUNTER — Other Ambulatory Visit (HOSPITAL_COMMUNITY): Payer: Self-pay | Admitting: Cardiology

## 2018-02-07 ENCOUNTER — Encounter: Payer: Self-pay | Admitting: Radiation Oncology

## 2018-02-07 VITALS — BP 98/40 | HR 66 | Ht 68.0 in | Wt 279.6 lb

## 2018-02-07 DIAGNOSIS — I472 Ventricular tachycardia, unspecified: Secondary | ICD-10-CM

## 2018-02-07 DIAGNOSIS — I5042 Chronic combined systolic (congestive) and diastolic (congestive) heart failure: Secondary | ICD-10-CM

## 2018-02-07 DIAGNOSIS — Z95810 Presence of automatic (implantable) cardiac defibrillator: Secondary | ICD-10-CM | POA: Diagnosis not present

## 2018-02-07 DIAGNOSIS — I255 Ischemic cardiomyopathy: Secondary | ICD-10-CM

## 2018-02-07 DIAGNOSIS — I447 Left bundle-branch block, unspecified: Secondary | ICD-10-CM | POA: Diagnosis not present

## 2018-02-07 DIAGNOSIS — Z79899 Other long term (current) drug therapy: Secondary | ICD-10-CM

## 2018-02-07 NOTE — Progress Notes (Signed)
HPI [name] returns today for ICD followup. He is a very pleasant 72 year old man with chronic systolic heart failure, class III, left bundle branch block, with a QRS duration of 135 ms, prior coronary artery disease and bypass grafting, with multiple remote stents status post percutaneous coronary intervention, ventricular tachycardia with VT storm in 2008, managed with chronic suppressive amiodarone therapy.  He admits to some dietary indiscretion, but he is trying to lose weight. He is s/p BiV ICD upgrade several months ago. He now has class 2 CHF (from class 3B).  Allergies  Allergen Reactions  . Tussionex Pennkinetic Er [Hydrocod Polst-Cpm Polst Er] Other (See Comments)    UNSPECIFIED REACTION  > "caused prostate problems"  . Ace Inhibitors Cough     Current Outpatient Medications  Medication Sig Dispense Refill  . albuterol (PROVENTIL HFA;VENTOLIN HFA) 108 (90 BASE) MCG/ACT inhaler Inhale 2 puffs into the lungs every 6 (six) hours as needed for wheezing or shortness of breath. 3 Inhaler 4  . albuterol (PROVENTIL) (2.5 MG/3ML) 0.083% nebulizer solution Take 3 mLs (2.5 mg total) by nebulization every 6 (six) hours. 120 mL 3  . amiodarone (PACERONE) 200 MG tablet Take 100 mg by mouth daily.    Marland Kitchen aspirin 81 MG tablet Take 81 mg by mouth daily.    Marland Kitchen atorvastatin (LIPITOR) 40 MG tablet TAKE 1 TABLET BY MOUTH  DAILY 90 tablet 3  . carvedilol (COREG) 25 MG tablet TAKE 1 TABLET BY MOUTH  TWICE A DAY WITH MEALS 180 tablet 3  . clopidogrel (PLAVIX) 75 MG tablet TAKE 1 TABLET BY MOUTH  DAILY 90 tablet 3  . diazepam (VALIUM) 5 MG tablet 1-2 tabs every 8 hours if needed for tremor 30 tablet 5  . finasteride (PROSCAR) 5 MG tablet Take 5 mg by mouth daily.    . fluticasone (FLONASE) 50 MCG/ACT nasal spray Instill 1 spray in each  nostril daily 32 g 3  . Fluticasone-Umeclidin-Vilant (TRELEGY ELLIPTA) 100-62.5-25 MCG/INH AEPB Inhale 1 puff into the lungs daily. 30 each 5  . isosorbide  mononitrate (IMDUR) 60 MG 24 hr tablet Take one (1) tablet (60 mg) by mouth daily. 90 tablet 0  . levothyroxine (SYNTHROID, LEVOTHROID) 50 MCG tablet TAKE 1 TABLET BY MOUTH  DAILY BEFORE BREAKFAST 90 tablet 1  . nitroGLYCERIN (NITROSTAT) 0.4 MG SL tablet DISSOLVE ONE TABLET UNDER  THE TONGUE EVERY 5 MINUTES  AS NEEDED FOR CHEST PAIN  (UP TO 3 TABLETS IN 15  MINUTES THEN CALL 911) 25 tablet 0  . NON FORMULARY at bedtime. CPAP And during the day as needed    . potassium chloride SA (K-DUR,KLOR-CON) 20 MEQ tablet Take 1 tablet (20 mEq total) by mouth daily. 90 tablet 3  . sacubitril-valsartan (ENTRESTO) 97-103 MG Take 1 tablet 2 (two) times daily by mouth. 180 tablet 3  . spironolactone (ALDACTONE) 25 MG tablet TAKE 1 TABLET BY MOUTH  DAILY 90 tablet 3  . torsemide (DEMADEX) 20 MG tablet Take 80mg  every morning. Take 40mg  every other evening. 540 tablet 3   No current facility-administered medications for this visit.      Past Medical History:  Diagnosis Date  . AAA (abdominal aortic aneurysm) ([place])    followed by Dr. Bridgett Larsson  . Adenomatous colon polyp 01/2004  . Anemia    hx  . Automatic implantable cardioverter-defibrillator in situ   . AVM (arteriovenous malformation)   . BPH (benign prostatic hypertrophy)   . CAD (coronary artery disease)  s/p CABGx2 in 1996  . Carotid artery stenosis    LCEA - Dr. Bridgett Larsson in 2013  . CHF (congestive heart failure) ([place])   . Complication of anesthesia    claustrophobic, unabe to lie on back more than 4 hours at time due to back  . COPD (chronic obstructive pulmonary disease) ([place])   . Diabetes mellitus    DIET CONTROLLED- pt states that this was a misdiagnosis, he was treated while in the hospital  but returned home, loss a massive amount of weight and he has not had a problem with his blood sugar since. States everything has been normal for about 3 years.  . Diverticulosis   . Dyspnea   . Dysrhythmia    ICD-defibrillator  . Fatigue   . GERD  (gastroesophageal reflux disease)   . H/O hiatal hernia   . History of radiation therapy 04/09/17-04/17/17   SBRT right lung 54 Gy in 3 fractions  . HLD (hyperlipidemia)   . Hypertension   . Hypothyroidism   . Ischemic cardiomyopathy   . Myocardial infarction ([place])   . OSA on CPAP    AHI durign total sleep 14.69/hr, during REM 50.91/hr  . Peripheral vascular disease ([facility])    LCEA, L renal artery stent, bilat iliac stents, R SFA stenosis  . Tremor   . Ventricular tachycardia ([place]) 09/09/2014   Amiodarone was started after appropriate defibrillator shocks for ventricular tachycardia in October 2008    ROS:   All systems reviewed and negative except as noted in the HPI.   Past Surgical History:  Procedure Laterality Date  . ABDOMINAL AORTIC ENDOVASCULAR STENT GRAFT [name] 01/06/2014   Procedure: ABDOMINAL AORTIC ENDOVASCULAR STENT GRAFT- GORE; ULTRASOUND GUIDED;  Surgeon: Conrad Prospect, MD;  Location: [facility];  Service: Vascular;  Laterality: [name];  . ANGIOPLASTY     BILATERAL  LE  W/STENTS  . BI-VENTRICULAR IMPLANTABLE CARDIOVERTER DEFIBRILLATOR UPGRADE [name] 10/09/2014   Procedure: BI-VENTRICULAR IMPLANTABLE CARDIOVERTER DEFIBRILLATOR UPGRADE;  Surgeon: Evans Lance, MD;  Location: [facility] CATH LAB;  Service: Cardiovascular;  Laterality: [name];  . BIV ICD GENERTAOR CHANGE OUT  10/09/2014   UPGRADE TO BIV        BY DR Lovena Le  . 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 Adora Fridge)  . CARDIAC CATHETERIZATION  10/03/2002   SVG sequentially to OM1 & OM2 totally occluded at ostium, SVG to PDA totally occluded within previously placed prox vein graft stent, smal distal AAA, bialt iliac stents with 30% left end-stent restenosis and 50% right end-stent restnosis(Dr. Gerrie Nordmann)  . CARDIAC CATHETERIZATION  06/11/1998   L  main with 20% narrowing in distal 1/3; LAD with 1st diagonla having 70% ostial narrowing, 2nd diagonal with 40% narrowing in prox third, LIMA & RIMA widely patent; in-stent restenosis of RCIA with successful PTA and new prox SVTRCA stent for residual disease (Dr. Marella Chimes)  . CARDIAC DEFIBRILLATOR PLACEMENT  06/2005   Guidant Vitality HE - ischemic cardiomyopathy - Dr. Marella Chimes  . CAROTID ENDARTERECTOMY Left 01/04/12  . CORONARY ANGIOPLASTY  01/08/2004   cutting balloon atherectomy & percutaneous intervention of RCIA in-stent restenosis (Dr. Marella Chimes)  . CORONARY ARTERY BYPASS GRAFT  11/04/1985   x2 - PDA and sequential DX-OM (Dr. Redmond Pulling)  . ENDARTERECTOMY  01/04/2012   Procedure: ENDARTERECTOMY CAROTID;left  Surgeon:  Hinda Lenis, MD;  Location: [facility];  Service: Vascular;  Laterality: Left;  with patch angioplasty  . FUDUCIAL PLACEMENT [name] 02/23/2017   Procedure: PLACEMENT OF FUDUCIAL;  Surgeon: [name], MD;  Location: [facility];  Service: Thoracic;  Laterality: [name];  . ICD GENERATOR CHANGE  05/02/2010   [employer], retail  . ILIAC ARTERY STENT Bilateral 03/1997   and L SFA PTA  . Iron infusion  June 16, 2012  . LEFT HEART CATHETERIZATION WITH CORONARY ANGIOGRAM [name] 07/06/2014   Procedure: LEFT HEART CATHETERIZATION WITH CORONARY ANGIOGRAM;  Surgeon: Peter M Martinique, MD;  Location[facility] CATH LAB;  Service: Cardiovascular;  Laterality: [name];  . NM MYOCAR PERF WALL MOTION  09/2012   lexiscan myoview - mod-severe perfusion defect r/t infarct or scar w/mild periinfarct ishcemia in basal inferior, mid inferior, apical inferior, basal inferolateral & mid inferoalteral regions - EF 21% low risk scan  . POLYPECTOMY    . RENAL ARTERY STENT Left 03/24/2004   6x67mm Genesis stent (Dr. Marella Chimes)  . RIGHT/LEFT HEART CATH AND CORONARY ANGIOGRAPHY [name] 12/28/2017   Procedure: RIGHT/LEFT HEART CATH AND CORONARY ANGIOGRAPHY;  Surgeon: Larey Dresser, MD;  Location: [facility] CV  LAB;  Service: Cardiovascular;  Laterality: [name];  . TRANSTHORACIC ECHOCARDIOGRAM  12/2012   EF 30-35; LV mod to severely dilated, mod concentric hypertrophy, severe hypokinesis of inferolateral myocardium, moderate hypokineis of anteroseptal region, grade 1 diastolic dysfunction; mod MR; LA mod-severely dialted; RV mod dialted; RA mildly dilated; PA peak pressure 32mmHg  . VIDEO BRONCHOSCOPY WITH ENDOBRONCHIAL NAVIGATION [name] 02/23/2017   Procedure: VIDEO BRONCHOSCOPY WITH ENDOBRONCHIAL NAVIGATION;  Surgeon: [name], MD;  Location: [facility] OR;  Service: Thoracic;  Laterality: [name];     Family History  Problem Relation Age of Onset  . Lung cancer Mother   . Cancer Mother   . Diabetes Mother   . Hypertension Mother   . Hyperlipidemia Mother   . Heart disease Mother        before age 26  . Heart attack Father   . Heart disease Father        before age 5  . Hypertension Father   . Colon cancer Sister   . Cancer Sister   . Hypertension Sister   . Hyperlipidemia Sister   . Parkinson's disease Sister   . Diabetes Sister   . Heart disease Sister        before age 55  . Heart attack Maternal Grandmother   . Diabetes Daughter      Social History   Socioeconomic History  . Marital status: Married    Spouse name: Not on file  . Number of children: 2  . Years of education: Not on file  . Highest education level: Not on file  Social Needs  . Financial resource strain: Not on file  . Food insecurity - worry: Not on file  . Food insecurity - inability: Not on file  . Transportation needs - medical: Not on file  . Transportation needs - non-medical: Not on file  Occupational History  . Occupation: retired Solicitor   Tobacco Use  . Smoking status: Former Smoker    Packs/day: 1.50    Years: 40.00    Pack years: 60.00    Types: Cigarettes, Pipe    Last attempt to quit: 12/11/2002    Years since quitting: 15.1  . Smokeless tobacco: Never Used  Substance and  Sexual Activity  . Alcohol use:  No    Alcohol/week: 0.0 oz  . Drug use: No  . Sexual activity: Not Currently  Other Topics Concern  . Not on file  Social History Narrative  . Not on file     BP (!) 98/40   Pulse 66   Ht 5\' 8"  (1.727 m)   Wt 279 lb 9.6 oz (126.8 kg)   BMI 42.51 kg/m   Physical Exam:  Stable appearing 72 yo man, NAD HEENT: Unremarkable Neck:  6 cm JVD, no thyromegally Lymphatics:  No adenopathy Back:  No CVA tenderness Lungs:  Clear with no wheezes HEART:  Regular rate rhythm, no murmurs, no rubs, no clicks Abd:  soft, positive bowel sounds, no organomegally, no rebound, no guarding Ext:  2 plus pulses, no edema, no cyanosis, no clubbing Skin:  No rashes no nodules Neuro:  CN II through XII intact, motor grossly intact  EKG - nsr with biv pacing  DEVICE  Normal device function.  See PaceArt for details.   Assess/Plan: 1. Chronic systolic heart failure - his symptoms are well controlled. His will continue her current meds. 2. ICD - His BiV ICD has been interrogated and found to be working normally. Will recheck in several months. 3. CAD - he denies anginal symptoms. Will follow.  Mikle Bosworth.D.

## 2018-02-07 NOTE — Patient Instructions (Addendum)
Medication Instructions:  Your physician has recommended you make the following change in your medication:  1.  Start taking 1/2 tablet of your Imdur.  Take 1/2 tablet for 2 weeks. 2.  After 2 weeks, take 1/2 of your Imdur every other day.   At the end of this 2 weeks stop taking the Imdur. 3.  If your chest pressure comes back resume taking 1/2 tablet daily.  Labwork: None ordered.  Testing/Procedures: None ordered.  Follow-Up: Your physician wants you to follow-up in: one year with Dr. Lovena Le.   You will receive a reminder letter in the mail two months in advance. If you don't receive a letter, please call our office to schedule the follow-up appointment.  Remote monitoring is used to monitor your ICD from home. This monitoring reduces the number of office visits required to check your device to one time per year. It allows [place] to keep an eye on the functioning of your device to ensure it is working properly. You are scheduled for a device check from home on 02/19/2018. You may send your transmission at any time that day. If you have a wireless device, the transmission will be sent automatically. After your physician reviews your transmission, you will receive a postcard with your next transmission date.  Any Other Special Instructions Will Be Listed Below (If Applicable).  If you need a refill on your cardiac medications before your next appointment, please call your pharmacy.

## 2018-02-08 ENCOUNTER — Ambulatory Visit: Payer: Medicare Other | Admitting: Vascular Surgery

## 2018-02-08 ENCOUNTER — Encounter: Payer: Self-pay | Admitting: Internal Medicine

## 2018-02-08 ENCOUNTER — Encounter (HOSPITAL_COMMUNITY): Payer: Medicare Other

## 2018-02-08 ENCOUNTER — Ambulatory Visit
Admission: RE | Admit: 2018-02-08 | Discharge: 2018-02-08 | Disposition: A | Discharge status: Home / Self Care | Payer: Medicare Other | Source: Ambulatory Visit | Attending: Radiation Oncology | Admitting: Radiation Oncology

## 2018-02-08 DIAGNOSIS — C3431 Malignant neoplasm of lower lobe, right bronchus or lung: Secondary | ICD-10-CM | POA: Diagnosis not present

## 2018-02-08 DIAGNOSIS — Z510 Encounter for antineoplastic radiation therapy: Secondary | ICD-10-CM | POA: Diagnosis not present

## 2018-02-08 DIAGNOSIS — R911 Solitary pulmonary nodule: Secondary | ICD-10-CM | POA: Diagnosis not present

## 2018-02-08 LAB — CUP PACEART INCLINIC DEVICE CHECK
Date Time Interrogation Session: 20190228050000
HighPow Impedance: 38 Ohm
HighPow Impedance: 48 Ohm
Implantable Lead Implant Date: 20060411
Implantable Lead Implant Date: 20151030
Implantable Lead Implant Date: 20151030
Implantable Lead Location: 753858
Implantable Lead Location: 753859
Implantable Lead Location: 753860
Implantable Lead Model: 148
Implantable Lead Model: 4136
Implantable Lead Serial Number: 145070
Implantable Lead Serial Number: 29713415
Implantable Pulse Generator Implant Date: 20151030
Lead Channel Impedance Value: 555 Ohm
Lead Channel Impedance Value: 641 Ohm
Lead Channel Impedance Value: 680 Ohm
Lead Channel Pacing Threshold Amplitude: 0.6 V
Lead Channel Pacing Threshold Amplitude: 0.6 V
Lead Channel Pacing Threshold Amplitude: 1.9 V
Lead Channel Pacing Threshold Pulse Width: 0.4 ms
Lead Channel Pacing Threshold Pulse Width: 0.4 ms
Lead Channel Pacing Threshold Pulse Width: 0.9 ms
Lead Channel Sensing Intrinsic Amplitude: 11.6 mV
Lead Channel Sensing Intrinsic Amplitude: 2.9 mV
Lead Channel Sensing Intrinsic Amplitude: 25 mV
Lead Channel Setting Pacing Amplitude: 2 V
Lead Channel Setting Pacing Amplitude: 2 V
Lead Channel Setting Pacing Amplitude: 3.5 V
Lead Channel Setting Pacing Pulse Width: 0.4 ms
Lead Channel Setting Pacing Pulse Width: 0.9 ms
Lead Channel Setting Sensing Sensitivity: 0.6 mV
Lead Channel Setting Sensing Sensitivity: 1 mV
Pulse Gen Serial Number: 370874

## 2018-02-11 ENCOUNTER — Encounter: Payer: Self-pay | Admitting: Radiation Oncology

## 2018-02-11 NOTE — Progress Notes (Signed)
  Radiation Oncology         [phone] ________________________________  Name: [name] MRN: [id number]  Date: 02/11/2018  DOB: October 29, 1946  End of Treatment Note  Diagnosis:   Enlarging  right lower lobe pulmonarynodule    Indication for treatment:  Curative       Radiation treatment dates:   02/04/2018, 02/06/2018, 02/08/2018  Site/dose:   Right lower lung, 18 Gy in 3 fractions for a total of 54 Gy  Beams/energy:   SBRT/SRT 3D, 6X-FFF  Narrative: The patient tolerated radiation treatment relatively well. Patient reported moderate fatigue (naps daily), constant SOB with sitting and exertion, and skin itching to BUE and BLE. No change from baseline. He denied pain, hemoptysis, trouble swallowing, or painful swallowing.   Plan: The patient has completed radiation treatment. The patient will return to radiation oncology clinic for routine followup in one month. I advised them to call or return sooner if they have any questions or concerns related to their recovery or treatment.  -----------------------------------  Blair Promise, PhD, MD  This document serves as a record of services personally performed by Gery Pray, MD. It was created on his behalf by [facility], a trained medical scribe. The creation of this record is based on the scribe's personal observations and the provider's statements to them. This document has been checked and approved by the attending provider.

## 2018-02-12 ENCOUNTER — Encounter: Payer: Self-pay | Admitting: Radiation Oncology

## 2018-02-13 ENCOUNTER — Encounter: Payer: Self-pay | Admitting: Radiation Oncology

## 2018-02-19 ENCOUNTER — Ambulatory Visit (INDEPENDENT_AMBULATORY_CARE_PROVIDER_SITE_OTHER): Payer: Medicare Other | Admitting: *Deleted

## 2018-02-19 DIAGNOSIS — I472 Ventricular tachycardia, unspecified: Secondary | ICD-10-CM

## 2018-02-19 NOTE — Progress Notes (Signed)
Remote ICD transmission.   

## 2018-02-20 ENCOUNTER — Encounter: Payer: Self-pay | Admitting: Cardiology

## 2018-02-21 ENCOUNTER — Ambulatory Visit: Payer: Medicare Other | Admitting: Internal Medicine

## 2018-02-21 ENCOUNTER — Ambulatory Visit (INDEPENDENT_AMBULATORY_CARE_PROVIDER_SITE_OTHER): Payer: Medicare Other | Admitting: Ophthalmology

## 2018-02-28 ENCOUNTER — Ambulatory Visit (INDEPENDENT_AMBULATORY_CARE_PROVIDER_SITE_OTHER): Payer: Medicare Other | Admitting: Ophthalmology

## 2018-03-09 LAB — CUP PACEART REMOTE DEVICE CHECK
Battery Remaining Longevity: 78 mo
Battery Remaining Percentage: 100 %
Brady Statistic RA Percent Paced: 0 %
Brady Statistic RV Percent Paced: 92 %
Date Time Interrogation Session: 20190312064200
HighPow Impedance: 45 Ohm
Implantable Lead Implant Date: 20060411
Implantable Lead Implant Date: 20151030
Implantable Lead Implant Date: 20151030
Implantable Lead Location: 753858
Implantable Lead Location: 753859
Implantable Lead Location: 753860
Implantable Lead Model: 148
Implantable Lead Model: 4136
Implantable Lead Serial Number: 145070
Implantable Lead Serial Number: 29713415
Implantable Pulse Generator Implant Date: 20151030
Lead Channel Impedance Value: 549 Ohm
Lead Channel Impedance Value: 611 Ohm
Lead Channel Impedance Value: 714 Ohm
Lead Channel Pacing Threshold Amplitude: 0.6 V
Lead Channel Pacing Threshold Amplitude: 0.6 V
Lead Channel Pacing Threshold Amplitude: 1.9 V
Lead Channel Pacing Threshold Pulse Width: 0.4 ms
Lead Channel Pacing Threshold Pulse Width: 0.4 ms
Lead Channel Pacing Threshold Pulse Width: 0.9 ms
Lead Channel Setting Pacing Amplitude: 2 V
Lead Channel Setting Pacing Amplitude: 2 V
Lead Channel Setting Pacing Amplitude: 3.5 V
Lead Channel Setting Pacing Pulse Width: 0.4 ms
Lead Channel Setting Pacing Pulse Width: 0.9 ms
Lead Channel Setting Sensing Sensitivity: 0.6 mV
Lead Channel Setting Sensing Sensitivity: 1 mV
Pulse Gen Serial Number: 370874

## 2018-03-11 ENCOUNTER — Other Ambulatory Visit: Payer: Self-pay

## 2018-03-11 ENCOUNTER — Encounter: Payer: Self-pay | Admitting: Radiation Oncology

## 2018-03-11 ENCOUNTER — Ambulatory Visit
Admission: RE | Admit: 2018-03-11 | Discharge: 2018-03-11 | Disposition: A | Discharge status: Home / Self Care | Payer: Medicare Other | Source: Ambulatory Visit | Attending: Radiation Oncology | Admitting: Radiation Oncology

## 2018-03-11 VITALS — BP 123/40 | HR 75 | Temp 98.4°F | Resp 24 | Wt 283.5 lb

## 2018-03-11 DIAGNOSIS — Z7982 Long term (current) use of aspirin: Secondary | ICD-10-CM | POA: Insufficient documentation

## 2018-03-11 DIAGNOSIS — Z923 Personal history of irradiation: Secondary | ICD-10-CM | POA: Insufficient documentation

## 2018-03-11 DIAGNOSIS — R911 Solitary pulmonary nodule: Secondary | ICD-10-CM | POA: Diagnosis not present

## 2018-03-11 DIAGNOSIS — Z7902 Long term (current) use of antithrombotics/antiplatelets: Secondary | ICD-10-CM | POA: Diagnosis not present

## 2018-03-11 DIAGNOSIS — Z888 Allergy status to other drugs, medicaments and biological substances status: Secondary | ICD-10-CM | POA: Diagnosis not present

## 2018-03-11 DIAGNOSIS — Y842 Radiological procedure and radiotherapy as the cause of abnormal reaction of the patient, or of later complication, without mention of misadventure at the time of the procedure: Secondary | ICD-10-CM | POA: Insufficient documentation

## 2018-03-11 DIAGNOSIS — R0602 Shortness of breath: Secondary | ICD-10-CM | POA: Insufficient documentation

## 2018-03-11 DIAGNOSIS — Z79899 Other long term (current) drug therapy: Secondary | ICD-10-CM | POA: Diagnosis not present

## 2018-03-11 NOTE — Progress Notes (Signed)
Radiation Oncology         [phone] ________________________________  Name: [name] MRN: [id number]  Date: 03/11/2018  DOB: 1946-05-25  Follow-Up Visit Note  CC: [name], MD  [name], *    ICD-10-CM   1. Nodule of right lung R91.1     Diagnosis: Enlarging right lower lobe pulmonarynodule   Interval Since Last Radiation: 1 month 02/04/18-02/08/18: 54 Gy to the right lower lobe in 3 fractions  Narrative:  The patient returns today for routine follow-up. He underwent chest CT on 01/11/18. This showed progressive radiation change within the right upper lobe with similar size of 2 right upper lobe pulmonary nodules. Significant enlargement of posterior right lower lobe pulmonary nodule. Minimal enlargement of a posterior left upper lobe pulmonary nodule. The patient reports he is doing well with no new complaints. He notes mild fatigue and complains of shortness of breath with activity. He denies pain, difficulty swallowing, chest pain, hemoptysis, coughing, or skin irritation. He reports a good appetite. He is using a CPAP at night.                 ALLERGIES:  is allergic to tussionex pennkinetic er [hydrocod polst-cpm polst er] and ace inhibitors.  Meds: Current Outpatient Medications  Medication Sig Dispense Refill  . albuterol (PROVENTIL HFA;VENTOLIN HFA) 108 (90 BASE) MCG/ACT inhaler Inhale 2 puffs into the lungs every 6 (six) hours as needed for wheezing or shortness of breath. 3 Inhaler 4  . albuterol (PROVENTIL) (2.5 MG/3ML) 0.083% nebulizer solution Take 3 mLs (2.5 mg total) by nebulization every 6 (six) hours. 120 mL 3  . amiodarone (PACERONE) 200 MG tablet Take 100 mg by mouth daily.    Marland Kitchen aspirin 81 MG tablet Take 81 mg by mouth daily.    Marland Kitchen atorvastatin (LIPITOR) 40 MG tablet TAKE 1 TABLET BY MOUTH  DAILY 90 tablet 3  . carvedilol (COREG) 25 MG tablet TAKE 1 TABLET BY MOUTH  TWICE A DAY WITH MEALS 180 tablet 3  . clopidogrel (PLAVIX) 75 MG  tablet TAKE 1 TABLET BY MOUTH  DAILY 90 tablet 3  . finasteride (PROSCAR) 5 MG tablet Take 5 mg by mouth daily.    . fluticasone (FLONASE) 50 MCG/ACT nasal spray Instill 1 spray in each  nostril daily 32 g 3  . Fluticasone-Umeclidin-Vilant (TRELEGY ELLIPTA) 100-62.5-25 MCG/INH AEPB Inhale 1 puff into the lungs daily. 30 each 5  . isosorbide mononitrate (IMDUR) 60 MG 24 hr tablet Take one (1) tablet (60 mg) by mouth daily. 90 tablet 0  . levothyroxine (SYNTHROID, LEVOTHROID) 50 MCG tablet TAKE 1 TABLET BY MOUTH  DAILY BEFORE BREAKFAST 90 tablet 1  . nitroGLYCERIN (NITROSTAT) 0.4 MG SL tablet DISSOLVE ONE TABLET UNDER  THE TONGUE EVERY 5 MINUTES  AS NEEDED FOR CHEST PAIN  (UP TO 3 TABLETS IN 15  MINUTES THEN CALL 911) 25 tablet 0  . NON FORMULARY at bedtime. CPAP And during the day as needed    . potassium chloride SA (K-DUR,KLOR-CON) 20 MEQ tablet Take 1 tablet (20 mEq total) by mouth daily. 90 tablet 3  . sacubitril-valsartan (ENTRESTO) 97-103 MG Take 1 tablet 2 (two) times daily by mouth. 180 tablet 3  . spironolactone (ALDACTONE) 25 MG tablet TAKE 1 TABLET BY MOUTH  DAILY 90 tablet 3  . torsemide (DEMADEX) 20 MG tablet Take 80mg  every morning. Take 40mg  every other evening. 540 tablet 3  . diazepam (VALIUM) 5 MG tablet 1-2 tabs every 8  hours if needed for tremor (Patient not taking: Reported on 03/11/2018) 30 tablet 5   No current facility-administered medications for this encounter.     Physical Findings: The patient is in no acute distress. Patient is alert and oriented.  weight is 283 lb 8 oz (128.6 kg). His oral temperature is 98.4 F (36.9 C). His blood pressure is 123/40 (abnormal) and his pulse is 75. His respiration is 24 (abnormal) and oxygen saturation is 98%. .  No significant changes. The patient presents with a nasal cannula in place. Lungs are clear to auscultation bilaterally. Heart has regular rate and rhythm. No palpable cervical, supraclavicular, or axillary adenopathy. Abdomen  soft, non-tender, normal bowel sounds.   Lab Findings: Lab Results  Component Value Date   WBC 6.8 12/28/2017   HGB 14.9 12/28/2017   HCT 45.9 12/28/2017   MCV 96.8 12/28/2017   PLT 135 (L) 12/28/2017    Radiographic Findings: No results found.  Impression:  The patient is recovering from the effects of radiation. The patient and his wife are inquiring about the lesion in the left lung. We will order a chest CT for baseline from his radiation treatment to the right lower lobe nodule and evaluation of questionable lesion in the left chest.  Plan: Follow-up with radiation oncology in 3 months,  with chest CT scan to be ordered in the near future.  ____________________________________  This document serves as a record of services personally performed by Gery Pray, MD. It was created on his behalf by Bethann Humble, a trained medical scribe. The creation of this record is based on the scribe's personal observations and the provider's statements to them. This document has been checked and approved by the attending provider.

## 2018-03-11 NOTE — Progress Notes (Signed)
[name] is here today for a follow-up appointment. Denies any pain . States that he has mild fatigue .States that he gets shortness of breath with activity. Denies any difficulty with swallowing. Denies any coughing . States that he uses a CPAP at night. Denies any skin irritation. States that his appetite is good. Wt Readings from Last 3 Encounters:  03/11/18 283 lb 8 oz (128.6 kg)  02/07/18 279 lb 9.6 oz (126.8 kg)  02/06/18 277 lb (125.6 kg)    Vitals:   03/11/18 1107  BP: (!) 123/40  Pulse: 75  Resp: (!) 24  Temp: 98.4 F (36.9 C)  TempSrc: Oral  SpO2: 98%  Weight: 283 lb 8 oz (128.6 kg)

## 2018-03-12 ENCOUNTER — Telehealth: Payer: Self-pay | Admitting: *Deleted

## 2018-03-12 NOTE — Telephone Encounter (Signed)
CALLED PATIENT TO INFORM OF CT FOR 03-18-18 - ARRIVAL TIME - 2:15 PM @ [facility], NO RESTRICTIONS TO TEST DUE TO BE ORDERED WITHOUT CONTRAST, LVM FOR A RETURN CALL

## 2018-03-15 ENCOUNTER — Other Ambulatory Visit (HOSPITAL_COMMUNITY): Payer: Self-pay | Admitting: Cardiology

## 2018-03-18 ENCOUNTER — Encounter (HOSPITAL_COMMUNITY): Payer: Self-pay

## 2018-03-18 ENCOUNTER — Ambulatory Visit (HOSPITAL_COMMUNITY)
Admission: RE | Admit: 2018-03-18 | Discharge: 2018-03-18 | Disposition: A | Discharge status: Home / Self Care | Payer: Medicare Other | Source: Ambulatory Visit | Attending: Radiation Oncology | Admitting: Radiation Oncology

## 2018-03-18 DIAGNOSIS — R911 Solitary pulmonary nodule: Secondary | ICD-10-CM | POA: Diagnosis present

## 2018-03-18 DIAGNOSIS — R918 Other nonspecific abnormal finding of lung field: Secondary | ICD-10-CM | POA: Insufficient documentation

## 2018-03-18 DIAGNOSIS — I700 Atherosclerosis of aorta: Secondary | ICD-10-CM | POA: Insufficient documentation

## 2018-03-18 DIAGNOSIS — J439 Emphysema, unspecified: Secondary | ICD-10-CM | POA: Insufficient documentation

## 2018-03-18 DIAGNOSIS — C3490 Malignant neoplasm of unspecified part of unspecified bronchus or lung: Secondary | ICD-10-CM | POA: Diagnosis not present

## 2018-03-18 HISTORY — DX: Malignant (primary) neoplasm, unspecified: C80.1

## 2018-03-19 ENCOUNTER — Telehealth: Payer: Self-pay | Admitting: Oncology

## 2018-03-19 NOTE — Telephone Encounter (Signed)
Called patient and spoke to his wife.  Advised her of the good CT chest results from 03/18/18 per Dr. Sondra Come.  Also let her know of the next follow up on 06/10/18.  She verbalized understanding and will let patient know.

## 2018-03-21 ENCOUNTER — Encounter: Payer: Self-pay | Admitting: Internal Medicine

## 2018-03-21 ENCOUNTER — Ambulatory Visit (INDEPENDENT_AMBULATORY_CARE_PROVIDER_SITE_OTHER): Payer: Medicare Other | Admitting: Internal Medicine

## 2018-03-21 VITALS — BP 124/74 | HR 82 | Ht 68.0 in | Wt 275.8 lb

## 2018-03-21 DIAGNOSIS — R911 Solitary pulmonary nodule: Secondary | ICD-10-CM | POA: Diagnosis not present

## 2018-03-21 DIAGNOSIS — G4733 Obstructive sleep apnea (adult) (pediatric): Secondary | ICD-10-CM | POA: Diagnosis not present

## 2018-03-21 DIAGNOSIS — J449 Chronic obstructive pulmonary disease, unspecified: Secondary | ICD-10-CM

## 2018-03-21 DIAGNOSIS — I255 Ischemic cardiomyopathy: Secondary | ICD-10-CM | POA: Diagnosis not present

## 2018-03-21 DIAGNOSIS — Z9989 Dependence on other enabling machines and devices: Secondary | ICD-10-CM | POA: Diagnosis not present

## 2018-03-21 NOTE — Patient Instructions (Addendum)
I think you would be ok to have a deep brain stimulator placed for your tremor  We can continue CPAP 12, mask of choice, humidifier, supplies, AirView We can continue O2 during the day at 2L  Please call if we can help

## 2018-03-21 NOTE — Assessment & Plan Note (Signed)
Weight loss is being encouraged.  I would like to motivate him towards some weight loss but he has not been prepared to change his lifestyle sufficiently to achieve this.

## 2018-03-21 NOTE — Assessment & Plan Note (Signed)
Feels better and sleeps better using CPAP.  His wife and download both confirm excellent compliance and control. Plan-continue CPAP 12

## 2018-03-21 NOTE — Progress Notes (Signed)
HPI 72 year old male former smoker followed for dyspnea/COPD, lung nodule/ XRT, OSA complicated by CAD/CHF/MI/AICD/CM/AAA, AICD, morbid obesity DM 2 HBP, GERD NPSG 11/05/07- [place]- AHI 14.6/ hr, desaturation to 84%, body weight 279 lbs  -------------------------------------------------------------------  12/20/17- 72 year old male former smoker followed for dyspnea/COPD, lung nodule/ XRT, OSA complicated by CAD/CHF/MI/AICD/CM/AAA, morbid obesity,  DM 2 HBP, GERD CPAP 12 /[facility] -----SOB at rest and exertion,OSA uses CPAP every night and at times during the day,has been using albuterol nebs 3 times/day  Easy dyspnea on exertion especially in the last 2 or 3 months.  Recognizes problem is mainly cardiac and now pending heart cath. He has been treated with XRT for PET positive RUL nodule, now with recurrence and extension- no biopsy dx. Having a bad cold around Christmas time now resolved.  Continues nebulizer/albuterol 2 or 3 times daily, Trelegy once daily and currently finishing doxycycline and prednisone. Tremor is getting worse such that he does not want to eat in public because he cannot control his utensils.  I suggested a trial of diazepam but he will want to reassess that with his PCP. Download 100% compliance, AHI 1.3/hour.  He does not want to sleep without CPAP. CT chest 09/26/17 IMPRESSION: 1. Response to therapy of right upper lobe lung nodule. 2. Interval enlargement of central right upper and posterior left upper lobe pulmonary nodules, suspicious for either synchronous primary bronchogenic carcinomas or pulmonary metastasis. 3.  Emphysema (ICD10-J43.9). 4.  Aortic Atherosclerosis (ICD10-I70.0). 5. Esophageal air fluid level suggests dysmotility or gastroesophageal reflux. CXR 12/18/17 IMPRESSION: Increased right upper lobe scarring since prior exam. No active disease.  03/21/18- 72 year old male former smoker followed for dyspnea/COPD, lung nodule/ XRT, OSA  complicated by CAD/CHF/MI/AICD/CM/AAA, morbid obesity,  DM 2 HBP, GERD CPAP 12 / [employer]  O2 2L -----OSA: DME: [place] [facility]. Pt wears CPAP nightly and DL attached. Pt continues to use O2 as well.  Trelegy, albuterol hfa, neb albuterol Occasional unexplained weakness lasts about a day.  No change otherwise in his breathing and no acute events.  Radiation oncology is tracking another lung nodule-previous XRT without biopsy. He uses oxygen during the day with any activity, CPAP at night.  No cough or wheeze.  Nebulizer twice daily, Trelegy once daily. Very comfortable with CPAP and quite sure he feels better because of it.  Download compliance 100% AHI 1.0/hour. He asks okay to undergo placement of deep brain stimulator for his tremor, saying cardiology has already cleared him.  ROS-see HPI    + = positive Constitutional:  +  weight loss, night sweats, fevers, chills, fatigue, lassitude. HEENT:   No-  headaches, difficulty swallowing, tooth/dental problems, sore throat,       No-  sneezing, itching, ear ache, nasal congestion, post nasal drip,  CV:  No-   chest pain, +orthopnea, PND, +swelling in lower extremities, No-anasarca, dizziness, palpitations Resp: + shortness of breath with exertion or at rest.              No-   productive cough,  No non-productive cough,  No- coughing up of blood.              No-   change in color of mucus.  No- wheezing.   Skin: No-   rash or lesions. GI:  No-   heartburn, indigestion, abdominal pain, nausea, vomiting, GU: . MS:  + joint pain or swelling.  Neuro-     nothing unusual Psych:  No- change in mood or affect. No depression  or anxiety.  No memory loss.  OBJ- Physical Exam   96%t saturation at rest on room air on arrival. +Wheelchair General- Alert, Oriented, Affect-appropriate, Distress- none acute, + morbidly obese  Skin- clear Lymphadenopathy- none Head- atraumatic            Eyes- Gross vision intact, PERRLA, conjunctivae and secretions  clear            Ears- Hearing, canals-normal            Nose- clear, no-Septal dev, mucus, polyps, erosion, perforation             Throat- Mallampati III, mucosa clear , drainage- none, tonsils- atrophic Neck- flexible , trachea midline, no stridor , thyroid nl, carotid no bruit Chest - symmetrical excursion , unlabored           Heart/CV- RRR , no murmur , no gallop  , no rub, nl s1 s2                           - JVD- none , edema -none, stasis changes- none, varices- none           Lung-+unlabored shallow inspiratory effort, rales- none, wheeze-none, cough- none ,                         dullness-none, rub- none,            Chest wall- sternotomy scar,  Pacemaker defibrillator L Abd-  Br/ Gen/ Rectal- Not done, not indicated Extrem- cyanosis- none, clubbing, none, atrophy- none, strength- nl, vein donor scars Neuro- +head bob tremor

## 2018-03-21 NOTE — Assessment & Plan Note (Signed)
CT reviewed.  Multiple small nodules which could easily be small adenocarcinomas.  He is being managed by radiation oncology.

## 2018-03-21 NOTE — Assessment & Plan Note (Signed)
Probably at his baseline now with no bronchitic symptoms-no routine cough or phlegm.  He is dependent on oxygen especially with activity and spends most of his time either reading or watching movies.  He uses CPAP for occasional symptomatic relief during the daytime.

## 2018-03-26 ENCOUNTER — Telehealth (HOSPITAL_COMMUNITY): Payer: Self-pay

## 2018-03-26 NOTE — Telephone Encounter (Signed)
Spoke with wife of patient c/o patient being more fatigued and staying in bed more. States BP 98/42 auto cuff at home which is a little lower than normal for him. Does say this has been an ongoing issue off and on. Has f/u appt with [name] in 2 weeks. Per Oda Kilts PA-, no changes at this time.  Advised with wife he is welcome to have BP check in clinic with nurse visit if interested.  Wife says if Dr. Is not concerned she is ok with waiting.  Will call us back if s/s worsen.  Advised HH PT/OT may be something they would be interested in, will add onto upcoming appt note to discuss with [name].  Renee Pain, RN

## 2018-04-12 ENCOUNTER — Encounter (HOSPITAL_COMMUNITY): Payer: Self-pay | Admitting: Cardiology

## 2018-04-12 ENCOUNTER — Other Ambulatory Visit: Payer: Self-pay

## 2018-04-12 ENCOUNTER — Ambulatory Visit (HOSPITAL_COMMUNITY)
Admission: RE | Admit: 2018-04-12 | Discharge: 2018-04-12 | Disposition: A | Discharge status: Home / Self Care | Payer: Medicare Other | Source: Ambulatory Visit | Attending: Cardiology | Admitting: Cardiology

## 2018-04-12 VITALS — BP 110/48 | HR 76 | Wt 271.0 lb

## 2018-04-12 DIAGNOSIS — Z923 Personal history of irradiation: Secondary | ICD-10-CM | POA: Insufficient documentation

## 2018-04-12 DIAGNOSIS — Z79899 Other long term (current) drug therapy: Secondary | ICD-10-CM | POA: Diagnosis not present

## 2018-04-12 DIAGNOSIS — I5022 Chronic systolic (congestive) heart failure: Secondary | ICD-10-CM | POA: Insufficient documentation

## 2018-04-12 DIAGNOSIS — J449 Chronic obstructive pulmonary disease, unspecified: Secondary | ICD-10-CM | POA: Diagnosis not present

## 2018-04-12 DIAGNOSIS — Z951 Presence of aortocoronary bypass graft: Secondary | ICD-10-CM | POA: Diagnosis not present

## 2018-04-12 DIAGNOSIS — I5042 Chronic combined systolic (congestive) and diastolic (congestive) heart failure: Secondary | ICD-10-CM | POA: Diagnosis not present

## 2018-04-12 DIAGNOSIS — Z85118 Personal history of other malignant neoplasm of bronchus and lung: Secondary | ICD-10-CM | POA: Insufficient documentation

## 2018-04-12 DIAGNOSIS — Z87891 Personal history of nicotine dependence: Secondary | ICD-10-CM | POA: Insufficient documentation

## 2018-04-12 DIAGNOSIS — I739 Peripheral vascular disease, unspecified: Secondary | ICD-10-CM | POA: Diagnosis not present

## 2018-04-12 DIAGNOSIS — I255 Ischemic cardiomyopathy: Secondary | ICD-10-CM | POA: Insufficient documentation

## 2018-04-12 DIAGNOSIS — E785 Hyperlipidemia, unspecified: Secondary | ICD-10-CM | POA: Diagnosis not present

## 2018-04-12 DIAGNOSIS — R251 Tremor, unspecified: Secondary | ICD-10-CM | POA: Insufficient documentation

## 2018-04-12 DIAGNOSIS — I2510 Atherosclerotic heart disease of native coronary artery without angina pectoris: Secondary | ICD-10-CM | POA: Insufficient documentation

## 2018-04-12 DIAGNOSIS — I714 Abdominal aortic aneurysm, without rupture: Secondary | ICD-10-CM | POA: Diagnosis not present

## 2018-04-12 DIAGNOSIS — I110 Hypertensive heart disease with heart failure: Secondary | ICD-10-CM | POA: Diagnosis not present

## 2018-04-12 DIAGNOSIS — I472 Ventricular tachycardia, unspecified: Secondary | ICD-10-CM

## 2018-04-12 LAB — HEMOGLOBIN A1C
Hgb A1c MFr Bld: 5.8 % — ABNORMAL HIGH (ref 4.8–5.6)
Mean Plasma Glucose: 119.76 mg/dL

## 2018-04-12 LAB — COMPREHENSIVE METABOLIC PANEL
ALT: 16 U/L — ABNORMAL LOW (ref 17–63)
AST: 17 U/L (ref 15–41)
Albumin: 3.8 g/dL (ref 3.5–5.0)
Alkaline Phosphatase: 63 U/L (ref 38–126)
Anion gap: 9 (ref 5–15)
BUN: 34 mg/dL — ABNORMAL HIGH (ref 6–20)
CO2: 29 mmol/L (ref 22–32)
Calcium: 9.1 mg/dL (ref 8.9–10.3)
Chloride: 102 mmol/L (ref 101–111)
Creatinine, Ser: 1.43 mg/dL — ABNORMAL HIGH (ref 0.61–1.24)
GFR calc Af Amer: 55 mL/min — ABNORMAL LOW (ref >60)
GFR calc non Af Amer: 47 mL/min — ABNORMAL LOW (ref >60)
Glucose, Bld: 116 mg/dL — ABNORMAL HIGH (ref 65–99)
Potassium: 4.6 mmol/L (ref 3.5–5.1)
Sodium: 140 mmol/L (ref 135–145)
Total Bilirubin: 0.8 mg/dL (ref 0.3–1.2)
Total Protein: 6.6 g/dL (ref 6.5–8.1)

## 2018-04-12 LAB — TSH: TSH: 4.03 u[IU]/mL (ref 0.350–4.500)

## 2018-04-12 LAB — LIPID PANEL
Cholesterol: 113 mg/dL (ref 0–200)
HDL: 28 mg/dL — ABNORMAL LOW (ref >40)
LDL Cholesterol: 60 mg/dL (ref 0–99)
Total CHOL/HDL Ratio: 4 RATIO
Triglycerides: 123 mg/dL (ref <150)
VLDL: 25 mg/dL (ref 0–40)

## 2018-04-12 MED ORDER — SACUBITRIL-VALSARTAN 49-51 MG PO TABS: 1.0000 | ORAL_TABLET | Freq: Two times a day (BID) | ORAL | 3 refills | Status: DC

## 2018-04-12 NOTE — Patient Instructions (Signed)
Decrease Entresto 49/51 (1 tab), twice a day  Labs drawn today (if we do not call you, then your lab work was stable)   Your physician recommends that you schedule a follow-up appointment in: 2 months with [name]

## 2018-04-13 NOTE — Progress Notes (Signed)
Patient ID: [name], male   DOB: [birth year], 72 y.o.   MRN: [id number] [facility]  04/13/2018 JERIEL VIVANCO   12/25/1945  [id number]  Primary Physicia [name], MD Hematologist: Dr Alen Blew  Pulmonary: Dr Annamaria Boots Cardiology: [name]  Mr. Patrone is a pleasant 72 y.o. with a past medical history significant for morbid obesity, systolic heart failure with an EF of 30-35% (January 2014) -> 20-25% (March 2015 - RV normal), VT on amio, CAD s/p CABG 1996, LBBB, COPD, obstructive sleep apnea on CPAP, AAA repair (January 2015) who was admitted to [facility] on 02/24/14 for acute on chronic HF and acute COPD exacerbation. He also had iron deficiency anemia.   Admitted to [facility] July 24 through July 06 2014 with chest pain.  CEs negative. Had a LHC with no change from previous LHC with recommendations to continue medical management. Discharge weight was 255 pounds.   LHC 07/06/14 --No significant change from previous studies.  Left anterior descending (LAD): The LAD is a large vessel. There is a 40% stenosis immediately after the takeoff of a large septal perforator. There are 2 large diagonal branches without significant disease. Left circumflex (LCx): The LCx is occluded proximally. There are left to left collaterals.  Right coronary artery (RCA): The RCA is occluded proximally immediately following the conus branch. There are right to right and left to right collaterals. SVGs from CABG known to be totally occluded.   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   CTA chest/abd/pelvis (3/16) with moderate emphysema, 1.3 cm nodule RUL, left SFA totally occluded, right SFA with significant stenosis, s/p endovascular AAA repair (stable).   Echo (5/17) with EF 20-25%, severe LV dilation, mild MR, normal RV size with mildly decreased systolic function.   He has a RUL nodule that is most likely lung cancer, he is being treated with radiation.   Echo (8/18) with EF 30%, basal-mid inferior  and inferolateral AK, severe LV dilation, normal RV, mild MR.   RHC/LHC was done in 1/19 due to worsening exertional dyspnea.  This showed normal filling pressures and preserved cardiac output.  There was 30-40% mLAD stenosis.  The LAD provided collaterals to the LCx and RCA territories.  The LCx, RCA, SVG-OM, and SVG-PDA were all chronically occluded (known from the past).  No new disease.   He saw neurology about his tremor.  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  However, he is on amiodarone which may be contributing.    Since last appointment, he had another round of chest radiation for RLL pulmonary nodule.   ABIs in 2/19 at [facility] with 0.78 on right, 0.59 on left => left iliac > 50% stenosis.   He returns today for followup of CHF and CAD.  Since last radiation, his blood pressure has been running lower. He is mildly lightheaded at times.  Breathing is stable.  He is mainly limited by fatigue.  Weight is stable.  He is short of breath walking about 100 yards. Has oxygen for use at home for COPD, but not using regularly. He has cramps in his left leg when sleeping but not with exertion.  He follows at [facility] for PAD, for now Dr. Bridgett Larsson to manage medically.   Labs 3/15 Cr 1.5 K 5.6 Labs 07/06/14 K 3.9 Creatinine  0.98 Labs 9/15 K 5.1, creatinine 0.96 Labs 11/15 K 3.8, creatinine 0.83, LDL 61, LFTs normal, TSH normal Labs 11/26/14 K 5.0 Creatinine 0.73 , LFTs normal, TSH normal,  Labs 12/08/14 K 3.4 Creatinine 0.83  Labs 3/16 K 4.4, creatinine 1.03, LFTs normal, TSH normal, HCT 37.8, BNP 65 Labs 6/16 K 4.1, creatinine 0.93, TSH normal, LFTs normal Labs 11/16 K 5.1, creatinine 1.13, LFTs normal, TSH normal, LDL 83, HDL 33, TGs 162 Labs 1/17 K 4.4, creatinine 0.86, pro-BNP 154 Labs 2/17 BNP 113, K 3.8, creatinine 0.79, TSH normal, LFTs normal Labs 4/17 K 3.8, creatinine 0.91, HCT 48.1 Labs 6/17 K 3.5, creatinine 0.98 Labs 9/17 K 3.9, creatinine 0.79, LDL 69, HDL 29, TSH normal, LFTs  normal Labs 3/18 K 3.6, creatinine 0.87 Labs 8/18 K 3.3 => 4.2, creatinine 0.78 => 1.04, LDL 66, HDL 38, BNP 166 Labs 9/18: K 4.7, creatinine 0.9, BNP 144 Labs 11/18: K 4.8, creatinine 1.05, TSH normal, LFTs normal.  Labs 1/19: K 4.4, creatinine 1.08, LFTs normal  ROS: All systems reviewed and negative except as per HPI.   Past Medical History:  Diagnosis Date  . AAA (abdominal aortic aneurysm) ([place])    followed by Dr. Bridgett Larsson  . Adenomatous colon polyp 01/2004  . Anemia    hx  . Automatic implantable cardioverter-defibrillator in situ   . AVM (arteriovenous malformation)   . BPH (benign prostatic hypertrophy)   . CAD (coronary artery disease)    s/p CABGx2 in 1996  . Carotid artery stenosis    LCEA - Dr. Bridgett Larsson in 2013  . CHF (congestive heart failure) ([place])   . Complication of anesthesia    claustrophobic, unabe to lie on back more than 4 hours at time due to back  . COPD (chronic obstructive pulmonary disease) ([place])   . Diabetes mellitus    DIET CONTROLLED- pt states that this was a misdiagnosis, he was treated while in the hospital  but returned home, loss a massive amount of weight and he has not had a problem with his blood sugar since. States everything has been normal for about 3 years.  . Diverticulosis   . Dyspnea   . Dysrhythmia    ICD-defibrillator  . Fatigue   . GERD (gastroesophageal reflux disease)   . H/O hiatal hernia   . History of radiation therapy 04/09/17-04/17/17   SBRT right lung 54 Gy in 3 fractions  . HLD (hyperlipidemia)   . Hypertension   . Hypothyroidism   . Ischemic cardiomyopathy   . Myocardial infarction ([place])   . NSCL ca   . OSA on CPAP    AHI durign total sleep 14.69/hr, during REM 50.91/hr  . Peripheral vascular disease ([facility])    LCEA, L renal artery stent, bilat iliac stents, R SFA stenosis  . Tremor   . Ventricular tachycardia ([place]) 09/09/2014   Amiodarone was started after appropriate defibrillator shocks for ventricular tachycardia in  October 2008    Current Outpatient Medications  Medication Sig Dispense Refill  . albuterol (PROVENTIL HFA;VENTOLIN HFA) 108 (90 BASE) MCG/ACT inhaler Inhale 2 puffs into the lungs every 6 (six) hours as needed for wheezing or shortness of breath. 3 Inhaler 4  . albuterol (PROVENTIL) (2.5 MG/3ML) 0.083% nebulizer solution Take 3 mLs (2.5 mg total) by nebulization every 6 (six) hours. 120 mL 3  . amiodarone (PACERONE) 200 MG tablet Take 100 mg by mouth daily.    Marland Kitchen aspirin 81 MG tablet Take 81 mg by mouth daily.    Marland Kitchen atorvastatin (LIPITOR) 40 MG tablet TAKE 1 TABLET BY MOUTH  DAILY 90 tablet 3  . carvedilol (COREG) 25 MG tablet TAKE 1 TABLET BY MOUTH  TWICE  A DAY WITH MEALS 180 tablet 3  . clopidogrel (PLAVIX) 75 MG tablet TAKE 1 TABLET BY MOUTH  DAILY 90 tablet 3  . diazepam (VALIUM) 5 MG tablet 1-2 tabs every 8 hours if needed for tremor 30 tablet 5  . finasteride (PROSCAR) 5 MG tablet Take 5 mg by mouth daily.    . fluticasone (FLONASE) 50 MCG/ACT nasal spray Instill 1 spray in each  nostril daily 32 g 3  . Fluticasone-Umeclidin-Vilant (TRELEGY ELLIPTA) 100-62.5-25 MCG/INH AEPB Inhale 1 puff into the lungs daily. 30 each 5  . isosorbide mononitrate (IMDUR) 60 MG 24 hr tablet Take one (1) tablet (60 mg) by mouth daily. 90 tablet 0  . levothyroxine (SYNTHROID, LEVOTHROID) 50 MCG tablet TAKE 1 TABLET BY MOUTH  DAILY BEFORE BREAKFAST 90 tablet 1  . nitroGLYCERIN (NITROSTAT) 0.4 MG SL tablet DISSOLVE ONE TABLET UNDER  THE TONGUE EVERY 5 MINUTES  AS NEEDED FOR CHEST PAIN  (UP TO 3 TABLETS IN 15  MINUTES THEN CALL 911) 25 tablet 0  . NON FORMULARY at bedtime. CPAP And during the day as needed    . potassium chloride SA (K-DUR,KLOR-CON) 20 MEQ tablet Take 1 tablet (20 mEq total) by mouth daily. 90 tablet 3  . spironolactone (ALDACTONE) 25 MG tablet TAKE 1 TABLET BY MOUTH  DAILY 90 tablet 3  . torsemide (DEMADEX) 20 MG tablet Take 80mg  every morning. Take 40mg  every other evening. 540 tablet 3  .  sacubitril-valsartan (ENTRESTO) 49-51 MG Take 1 tablet by mouth 2 (two) times daily. 60 tablet 3   No current facility-administered medications for this encounter.     Allergies  Allergen Reactions  . Tussionex Pennkinetic Er [Hydrocod Polst-Cpm Polst Er] Other (See Comments)    UNSPECIFIED REACTION  > "caused prostate problems"  . Ace Inhibitors Cough    Social History   Socioeconomic History  . Marital status: Married    Spouse name: Not on file  . Number of children: 2  . Years of education: Not on file  . Highest education level: Not on file  Occupational History  . Occupation: retired Solicitor   Social Needs  . Financial resource strain: Not on file  . Food insecurity:    Worry: Not on file    Inability: Not on file  . Transportation needs:    Medical: Not on file    Non-medical: Not on file  Tobacco Use  . Smoking status: Former Smoker    Packs/day: 1.50    Years: 40.00    Pack years: 60.00    Types: Cigarettes, Pipe    Last attempt to quit: 12/11/2002    Years since quitting: 15.3  . Smokeless tobacco: Never Used  Substance and Sexual Activity  . Alcohol use: No    Alcohol/week: 0.0 oz  . Drug use: No  . Sexual activity: Not Currently  Lifestyle  . Physical activity:    Days per week: Not on file    Minutes per session: Not on file  . Stress: Not on file  Relationships  . Social connections:    Talks on phone: Not on file    Gets together: Not on file    Attends religious service: Not on file    Active member of club or organization: Not on file    Attends meetings of clubs or organizations: Not on file    Relationship status: Not on file  . Intimate partner violence:    Fear of current or ex  partner: Not on file    Emotionally abused: Not on file    Physically abused: Not on file    Forced sexual activity: Not on file  Other Topics Concern  . Not on file  Social History Narrative  . Not on file    Family History  Problem  Relation Age of Onset  . Lung cancer Mother   . Cancer Mother   . Diabetes Mother   . Hypertension Mother   . Hyperlipidemia Mother   . Heart disease Mother        before age 30  . Heart attack Father   . Heart disease Father        before age 30  . Hypertension Father   . Colon cancer Sister   . Cancer Sister   . Hypertension Sister   . Hyperlipidemia Sister   . Parkinson's disease Sister   . Diabetes Sister   . Heart disease Sister        before age 63  . Heart attack Maternal Grandmother   . Diabetes Daughter    Blood pressure (!) 110/48, pulse 76, weight 271 lb (122.9 kg), SpO2 95 %.   Wt Readings from Last 3 Encounters:  04/12/18 271 lb (122.9 kg)  03/21/18 275 lb 12.8 oz (125.1 kg)  03/11/18 283 lb 8 oz (128.6 kg)    General: NAD Neck: No JVD, no thyromegaly or thyroid nodule.  Lungs: Clear to auscultation bilaterally with normal respiratory effort. CV: Nondisplaced PMI.  Heart regular S1/S2, no S3/S4, no murmur.  No peripheral edema.  No carotid bruit.  Unable to palpate pedal pulses.  Abdomen: Soft, nontender, no hepatosplenomegaly, no distention.  Skin: Intact without lesions or rashes.  Neurologic: Alert and oriented x 3.  Psych: Normal affect. Extremities: No clubbing or cyanosis.  HEENT: Normal.   ASSESSMENT/PLAN:  1. Chronic systolic CHF: Ischemic cardiomyopathy. Last echo in 8/18 showed EF 30% with severe LV dilation and normal RV.  [employer] CRT-D.  RHC in 1/19 showed normal filling pressures and preserved cardiac output.  NYHA class III symptoms.  He is not volume overloaded on exam.  I think that his current symptoms are more related to COPD than CHF. BP ruhning lower since last cycle of chest radiation.  - Continue torsemide 80 qam/40 every other pm.  BMET today.  - Continue carvedilol 25 mg twice a day  - With lower BP, will decrease Entresto to 49/51 bid.     - Continue spironolactone 25 mg daily.  2. CAD s/p CABG: No chest pain. [place] 1/19  with stable anatomy (LAD is patent, other vessels occluded with collaterals from the LAD). - Continue 81 mg aspirin daily and atorvastatin, good lipids 8/18. 3. VT s/p ICD: On amiodarone for history of ICD discharges due to VT.   - Check LFTs and TSH. Knows he needs yearly eye exams.   - Given increased tremor, I cut back amiodarone to 100 mg daily. However, given multiple ICD discharges in the past, I do not want to stop it totally.  4. Morbid obesity: Still needs to work on diet/weight loss.  5. COPD: Moderate to severe.  I think that a lot of his symptoms are related to COPD.   - Followup with pulmonary. - Now has home oxygen.  6. Lung cancer: Has had radiation.  7. PAD: Occluded left SFA and significant right SFA stenosis on last CTA.  ABIs in 2/19 with significant disease bilaterally, worse on left.  Does not  get pain with exertion but has nocturnal cramps in left leg.  Followed by [facility], saw Dr. Bridgett Larsson recently with plan for medical management.  He is on statin with optimized lipids.  8. AAA: s/p repair.  Followed at [facility].  No endoleak on last evaluation.  9. Tremor: Most likely essential tremor given family history.  However, cannot rule out role for amiodarone.  I think that he needs to continue at least a low dose of amiodarone.  Dr. Carles Collet with neurology discussed deep brain stimulation.  I think he is stable from a cardiovascular standpoint to have this placed but would need to clear with pulmonary also given significant COPD.   Followup in 2 months.   [name] 04/13/2018

## 2018-05-09 DIAGNOSIS — Z961 Presence of intraocular lens: Secondary | ICD-10-CM | POA: Diagnosis not present

## 2018-05-09 DIAGNOSIS — H26493 Other secondary cataract, bilateral: Secondary | ICD-10-CM | POA: Diagnosis not present

## 2018-05-09 DIAGNOSIS — H524 Presbyopia: Secondary | ICD-10-CM | POA: Diagnosis not present

## 2018-05-09 LAB — HM DIABETES EYE EXAM

## 2018-05-18 ENCOUNTER — Other Ambulatory Visit: Payer: Self-pay | Admitting: Internal Medicine

## 2018-05-18 ENCOUNTER — Other Ambulatory Visit (HOSPITAL_COMMUNITY): Payer: Self-pay | Admitting: Cardiology

## 2018-05-18 ENCOUNTER — Other Ambulatory Visit: Payer: Self-pay | Admitting: Cardiovascular Disease

## 2018-05-18 DIAGNOSIS — I5042 Chronic combined systolic (congestive) and diastolic (congestive) heart failure: Secondary | ICD-10-CM

## 2018-05-20 ENCOUNTER — Ambulatory Visit (INDEPENDENT_AMBULATORY_CARE_PROVIDER_SITE_OTHER): Payer: Medicare Other | Admitting: Psychology

## 2018-05-20 ENCOUNTER — Encounter: Payer: Self-pay | Admitting: Psychology

## 2018-05-20 ENCOUNTER — Ambulatory Visit: Payer: Medicare Other | Admitting: Psychology

## 2018-05-20 DIAGNOSIS — R413 Other amnesia: Secondary | ICD-10-CM | POA: Diagnosis not present

## 2018-05-20 DIAGNOSIS — R251 Tremor, unspecified: Secondary | ICD-10-CM

## 2018-05-20 NOTE — Progress Notes (Signed)
   Neuropsychology Note  [name] completed 60 minutes of neuropsychological testing with technician, Milana Kidney, BS, under the supervision of Dr. Macarthur Critchley, Licensed Psychologist. The patient did not appear overtly distressed by the testing session, per behavioral observation or via self-report to the technician. Rest breaks were offered.   Clinical Decision Making: In considering the patient's current level of functioning, level of presumed impairment, nature of symptoms, emotional and behavioral responses during the interview, level of literacy, and observed level of motivation/effort, a battery of tests was selected and communicated to the psychometrician.  Communication between the psychologist and technician was ongoing throughout the testing session and changes were made as deemed necessary based on patient performance on testing, technician observations and additional pertinent factors such as those listed above.  [name] will return within approximately 2 weeks for an interactive feedback session with Dr. Si Raider at which time his test performances, clinical impressions and treatment recommendations will be reviewed in detail. The patient understands he can contact our office should he require our assistance before this time.  35 minutes spent performing neuropsychological evaluation services/clinical decision making (psychologist). [CPT 76151] 60 minutes spent face-to-face with patient administering standardized tests, 30 minutes spent scoring (technician). [CPT [id number], 83437]  Full report to follow.

## 2018-05-20 NOTE — Progress Notes (Signed)
NEUROBEHAVIORAL STATUS EXAM   Name: [name] Date of Birth: 24-Feb-1946 Date of Interview: 05/20/2018  Reason for Referral:  [name] is a 72 y.o. male who is referred for neuropsychological evaluation by Dr. Wells Guiles Tat of [facility] to assess his current level of cognitive functioning and assist in differential diagnosis. This was also part of a pre-surgical evaluation for deep brain stimulation (DBS) to assist in the management of essential tremor.  This patient is accompanied in the office by his wife who supplements the history.  History of Presenting Problem:  [name] has a history of tremor which started approximately 10 years ago and involves upper extremities equally. He has a family history of tremor in both sisters, and his daughter has started to demonstrate a tremor as well. The patient was evaluated by Dr. Carles Collet on 01/07/2018 and she felt his symptoms represented essential tremor which could be exacerbated by one of the medications he is taking, amiodarone. At that visit, the patient and his wife also endorsed short term memory changes. MoCA was 21/30 but it was noted that his performance was affected by his tremor. He was found to have low normal vitamin B12 and oral supplementation was recommended.   At today's visit (05/20/2018), the patient reports short term memory loss which he does not feel interferes with his daily functioning. His wife has noticed short term memory difficulties in him for the past 1-2 years. They both report that he will forget things she has told him, but it is unclear if the information ever registered to begin with. They also report word finding difficulty with slower retrieval but the word/name does eventually come to him. He does not forget recent events. He is not misplacing or losing items. He is very organized. He denies any difficulty with attention or concentration. He reads 6-8 hours per day and denies any difficulty with  comprehension. He denies any difficulty with navigation or driving. His wife does most of the driving, however. She has also always been the one to manage his appointments/calendar and the finances. He manages his own medications and is very organized with this and denies any problems or forgetfulness with this.   Physically, the patient complains of weakness related to recent radiation treatment for possible lung cancer. He sleeps more when undergoing this treatment. He reports mild instability in walking and uses a cane outside of his home. He has not had any falls. He denies any sleep difficulties. His appetite is "too good; I'm obese". He denies any history of head injury or concussion. He has not had any hallucinations.   Psychiatric history was denied. He has never been diagnosed with or treated for any mental health condition. His mood is generally good, "happy". He denies significant depression, irritability or anxiety. He denies past or present suicidal ideation or intention. He denies history of substance abuse or dependence. He does not drink any alcohol. He does not use any illicit substances.  He reports having a strong support system including his wife, several friends, his church and his children and grandchildren. He loves to learn and is always working on a project. Currently he is learning [place]. He also loves doing research on the computer. 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 He used to enjoy painting model cars but again his tremor prevents him from doing this. Because he can no longer do these things, he states "the only thing  left is my brain so I'm going to keep using it".    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  Marital history: Married x53 years, with 2 children (son and daughter), 4  grandchildren and 1 great grandchild. Alcohol: None Tobacco: None. Quit smoking about 14 years ago. Other substance use/abuse: None   Medical History: Past Medical History:  Diagnosis Date  . AAA (abdominal aortic aneurysm) ([facility])    followed by Dr. Bridgett Larsson  . Adenomatous colon polyp 01/2004  . Anemia    hx  . Automatic implantable cardioverter-defibrillator in situ   . AVM (arteriovenous malformation)   . BPH (benign prostatic hypertrophy)   . CAD (coronary artery disease)    s/p CABGx2 in 1996  . Carotid artery stenosis    LCEA - Dr. Bridgett Larsson in 2013  . CHF (congestive heart failure) ([place])   . Complication of anesthesia    claustrophobic, unabe to lie on back more than 4 hours at time due to back  . COPD (chronic obstructive pulmonary disease) ([place])   . Diabetes mellitus    DIET CONTROLLED- pt states that this was a misdiagnosis, he was treated while in the hospital  but returned home, loss a massive amount of weight and he has not had a problem with his blood sugar since. States everything has been normal for about 3 years.  . Diverticulosis   . Dyspnea   . Dysrhythmia    ICD-defibrillator  . Fatigue   . GERD (gastroesophageal reflux disease)   . H/O hiatal hernia   . History of radiation therapy 04/09/17-04/17/17   SBRT right lung 54 Gy in 3 fractions  . HLD (hyperlipidemia)   . Hypertension   . Hypothyroidism   . Ischemic cardiomyopathy   . Myocardial infarction ([place] [facility])   . NSCL ca   . OSA on CPAP    AHI durign total sleep 14.69/hr, during REM 50.91/hr  . Peripheral vascular disease ([facility])    LCEA, L renal artery stent, bilat iliac stents, R SFA stenosis  . Tremor   . Ventricular tachycardia ([place]) 09/09/2014   Amiodarone was started after appropriate defibrillator shocks for ventricular tachycardia in October 2008     Current Medications:  Outpatient Encounter Medications as of 05/20/2018  Medication Sig  . albuterol (PROVENTIL HFA;VENTOLIN HFA) 108 (90 BASE) MCG/ACT  inhaler Inhale 2 puffs into the lungs every 6 (six) hours as needed for wheezing or shortness of breath.  Marland Kitchen albuterol (PROVENTIL) (2.5 MG/3ML) 0.083% nebulizer solution Take 3 mLs (2.5 mg total) by nebulization every 6 (six) hours.  Marland Kitchen amiodarone (PACERONE) 200 MG tablet Take 100 mg by mouth daily.  Marland Kitchen aspirin 81 MG tablet Take 81 mg by mouth daily.  Marland Kitchen atorvastatin (LIPITOR) 40 MG tablet TAKE 1 TABLET BY MOUTH  DAILY  . carvedilol (COREG) 25 MG tablet TAKE 1 TABLET BY MOUTH  TWICE A DAY WITH MEALS  . clopidogrel (PLAVIX) 75 MG tablet TAKE 1 TABLET BY MOUTH  DAILY  . diazepam (VALIUM) 5 MG tablet 1-2 tabs every 8 hours if needed for tremor  . finasteride (PROSCAR) 5 MG tablet Take 5 mg by mouth daily.  . fluticasone (FLONASE) 50 MCG/ACT nasal spray Instill 1 spray in each  nostril daily  . Fluticasone-Umeclidin-Vilant (TRELEGY ELLIPTA) 100-62.5-25 MCG/INH AEPB Inhale 1 puff into the lungs daily.  . isosorbide mononitrate (IMDUR) 60 MG 24 hr tablet TAKE 1 TABLET BY MOUTH  DAILY  . levothyroxine (SYNTHROID, LEVOTHROID) 50 MCG tablet TAKE 1 TABLET BY MOUTH  DAILY BEFORE BREAKFAST  . nitroGLYCERIN (NITROSTAT) 0.4 MG SL tablet DISSOLVE ONE TABLET UNDER  THE TONGUE EVERY 5 MINUTES  AS NEEDED FOR CHEST PAIN  (UP TO 3 TABLETS IN 15  MINUTES THEN CALL 911)  . NON FORMULARY at bedtime. CPAP And during the day as needed  . potassium chloride SA (K-DUR,KLOR-CON) 20 MEQ tablet Take 1 tablet (20 mEq total) by mouth daily.  . sacubitril-valsartan (ENTRESTO) 49-51 MG Take 1 tablet by mouth 2 (two) times daily.  Marland Kitchen spironolactone (ALDACTONE) 25 MG tablet TAKE 1 TABLET BY MOUTH  DAILY  . torsemide (DEMADEX) 20 MG tablet Take 80mg  every morning. Take 40mg  every other evening.   No facility-administered encounter medications on file as of 05/20/2018.      Behavioral Observations:   Appearance: Neatly and appropriately dressed and groomed Gait: Ambulated with a cane, mild instability Speech: Fluent; normal rate,  rhythm and volume. No significant word finding difficulty observed during conversational speech. Thought process: Linear, goal directed Affect: Full, bright, jovial Interpersonal: Pleasant, appropriate   40 minutes spent face-to-face with patient completing neurobehavioral status exam. 25 minutes spent integrating medical records/clinical data and completing this report. CPT code [id number] unit.   TESTING: There is medical necessity to proceed with neuropsychological assessment as the results will be used to aid in differential diagnosis and clinical decision-making and to inform specific treatment recommendations. Per the patient and his wife, there has been a change in cognitive functioning and a reasonable suspicion of mild cognitive impairment. Additionally, there is medical necessity to proceed with pre-surgical neuropsychological evaluation to inform whether the patient might safely proceed with a medical or surgical procedure that may affect brain function (i.e., deep brain stimulation).   Clinical Decision Making: In considering the patient's current level of functioning, level of presumed impairment, nature of symptoms, emotional and behavioral responses during the interview, level of literacy, and observed level of motivation, a battery of tests was selected and communicated to the psychometrician.   Following the clinical interview/neurobehavioral status exam, the patient completed this full battery of neuropsychological testing with my psychometrician under my supervision (see separate note).   PLAN: Evaluation ongoing; full report to follow. Upon completion, results and report will be provided to Dr. Carles Collet, and the patient understands he will follow up with her for results and next steps.

## 2018-05-21 ENCOUNTER — Encounter: Payer: Self-pay | Admitting: Psychology

## 2018-05-21 ENCOUNTER — Ambulatory Visit (INDEPENDENT_AMBULATORY_CARE_PROVIDER_SITE_OTHER): Payer: Medicare Other | Admitting: *Deleted

## 2018-05-21 DIAGNOSIS — I255 Ischemic cardiomyopathy: Secondary | ICD-10-CM | POA: Diagnosis not present

## 2018-05-21 LAB — CUP PACEART REMOTE DEVICE CHECK
Battery Remaining Longevity: 78 mo
Battery Remaining Percentage: 100 %
Brady Statistic RA Percent Paced: 0 %
Brady Statistic RV Percent Paced: 92 %
Date Time Interrogation Session: 20190611064100
HighPow Impedance: 48 Ohm
Implantable Lead Implant Date: 20060411
Implantable Lead Implant Date: 20151030
Implantable Lead Implant Date: 20151030
Implantable Lead Location: 753858
Implantable Lead Location: 753859
Implantable Lead Location: 753860
Implantable Lead Model: 148
Implantable Lead Model: 4136
Implantable Lead Serial Number: 145070
Implantable Lead Serial Number: 29713415
Implantable Pulse Generator Implant Date: 20151030
Lead Channel Impedance Value: 540 Ohm
Lead Channel Impedance Value: 616 Ohm
Lead Channel Impedance Value: 699 Ohm
Lead Channel Pacing Threshold Amplitude: 0.6 V
Lead Channel Pacing Threshold Amplitude: 0.6 V
Lead Channel Pacing Threshold Amplitude: 1.9 V
Lead Channel Pacing Threshold Pulse Width: 0.4 ms
Lead Channel Pacing Threshold Pulse Width: 0.4 ms
Lead Channel Pacing Threshold Pulse Width: 0.9 ms
Lead Channel Setting Pacing Amplitude: 2 V
Lead Channel Setting Pacing Amplitude: 2 V
Lead Channel Setting Pacing Amplitude: 3.5 V
Lead Channel Setting Pacing Pulse Width: 0.4 ms
Lead Channel Setting Pacing Pulse Width: 0.9 ms
Lead Channel Setting Sensing Sensitivity: 0.6 mV
Lead Channel Setting Sensing Sensitivity: 1 mV
Pulse Gen Serial Number: 370874

## 2018-05-21 NOTE — Progress Notes (Signed)
Remote ICD transmission.   

## 2018-05-21 NOTE — Progress Notes (Signed)
NEUROPSYCHOLOGICAL EVALUATION   Name:    [name]  Date of Birth:   Aug 24, 1946 Date of Interview:  05/20/2018 Date of Testing:  05/20/2018   Date of Report:  05/21/2018       Background Information:  Reason for Referral:  [name] is a 72 y.o. male who is referred for neuropsychological evaluation by Dr. Wells Guiles Tat of [facility] to assess hiscurrent level of cognitive functioning and assist in differential diagnosis.This was also part of a pre-surgical evaluation for deep brain stimulation (DBS) to assist in the management of essential tremor.The current evaluation consisted of a review of available medical records, an interview with the patient and his wife, and the completion of a neuropsychological testing battery. Informed consent was obtained.  History of Presenting Problem:  [name] has a history of tremor which started approximately 10 years ago and involves upper extremities equally. He has a family history of tremor in both sisters, and his daughter has started to demonstrate a tremor as well. The patient was evaluated by Dr. Carles Collet on 01/07/2018 and she felt his symptoms represented essential tremor which could be exacerbated by one of the medications he is taking, amiodarone. At that visit, the patient and his wife also endorsed short term memory changes. MoCA was 21/30 but it was noted that his performance was affected by his tremor. He was found to have low normal vitamin B12 and oral supplementation was recommended.   At today's visit (05/20/2018), the patient reports short term memory loss which he does not feel interferes with his daily functioning. His wife has noticed short term memory difficulties in him for the past 1-2 years. They both report that he will forget things she has told him, but it is unclear if the information ever registered to begin with. They also report word finding difficulty with slower retrieval but the word/name does eventually come  to him. He does not forget recent events. He is not misplacing or losing items. He is very organized. He denies any difficulty with attention or concentration. He reads 6-8 hours per day and denies any difficulty with comprehension. He denies any difficulty with navigation or driving. His wife does most of the driving, however. She has also always been the one to manage his appointments/calendar and the finances. He manages his own medications and is very organized with this and denies any problems or forgetfulness with this.   Physically, the patient complains of weakness related to recent radiation treatment for possible lung cancer. He sleeps more when undergoing this treatment. He reports mild instability in walking and uses a cane outside of his home. He has not had any falls. He denies any sleep difficulties. His appetite is "too good; I'm obese". He denies any history of head injury or concussion. He has not had any hallucinations.   Psychiatric history was denied. He has never been diagnosed with or treated for any mental health condition. His mood is generally good, "happy". He denies significant depression, irritability or anxiety. He denies past or present suicidal ideation or intention. He denies history of substance abuse or dependence. He does not drink any alcohol. He does not use any illicit substances.  He reports having a strong support system including his wife, several friends, his church and his children and grandchildren. He loves to learn and is always working on a project. Currently he is learning [place]. He also loves doing research on the computer. 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 He used to enjoy painting model cars but again his tremor prevents him from doing this. Because he can no longer do these things, he states "the only thing left is my brain so I'm going to keep using it".    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  Marital history: Married x53 years, with 2 children (son and daughter), 4 grandchildren and 1 great grandchild. Alcohol: None Tobacco: None. Quit smoking about 14 years ago. Other substance use/abuse: None   Medical History:  Past Medical History:  Diagnosis Date  . AAA (abdominal aortic aneurysm) ([place])    followed by Dr. Bridgett Larsson  . Adenomatous colon polyp 01/2004  . Anemia    hx  . Automatic implantable cardioverter-defibrillator in situ   . AVM (arteriovenous malformation)   . BPH (benign prostatic hypertrophy)   . CAD (coronary artery disease)    s/p CABGx2 in 1996  . Carotid artery stenosis    LCEA - Dr. Bridgett Larsson in 2013  . CHF (congestive heart failure) ([place])   . Complication of anesthesia    claustrophobic, unabe to lie on back more than 4 hours at time due to back  . COPD (chronic obstructive pulmonary disease) ([place])   . Diabetes mellitus    DIET CONTROLLED- pt states that this was a misdiagnosis, he was treated while in the hospital  but returned home, loss a massive amount of weight and he has not had a problem with his blood sugar since. States everything has been normal for about 3 years.  . Diverticulosis   . Dyspnea   . Dysrhythmia    ICD-defibrillator  . Fatigue   . GERD (gastroesophageal reflux disease)   . H/O hiatal hernia   . History of radiation therapy 04/09/17-04/17/17   SBRT right lung 54 Gy in 3 fractions  . HLD (hyperlipidemia)   . Hypertension   . Hypothyroidism   . Ischemic cardiomyopathy   . Myocardial infarction ([place])   . NSCL ca   . OSA on CPAP    AHI durign total sleep 14.69/hr, during REM 50.91/hr  . Peripheral vascular disease ([facility])    LCEA, L renal artery stent, bilat iliac stents, R SFA stenosis  . Tremor   . Ventricular tachycardia ([place]) 09/09/2014   Amiodarone was started after appropriate defibrillator  shocks for ventricular tachycardia in October 2008    Current medications:  Outpatient Encounter Medications as of 05/20/2018  Medication Sig  . albuterol (PROVENTIL HFA;VENTOLIN HFA) 108 (90 BASE) MCG/ACT inhaler Inhale 2 puffs into the lungs every 6 (six) hours as needed for wheezing or shortness of breath.  Marland Kitchen albuterol (PROVENTIL) (2.5 MG/3ML) 0.083% nebulizer solution Take 3 mLs (2.5 mg total) by nebulization every 6 (six) hours.  Marland Kitchen amiodarone (PACERONE) 200 MG tablet Take 100 mg by mouth daily.  Marland Kitchen aspirin 81 MG tablet Take 81 mg by mouth daily.  Marland Kitchen atorvastatin (LIPITOR) 40 MG tablet TAKE 1 TABLET BY MOUTH  DAILY  . carvedilol (COREG) 25 MG tablet TAKE 1 TABLET BY MOUTH  TWICE A DAY WITH MEALS  . clopidogrel (PLAVIX) 75 MG tablet TAKE 1 TABLET BY MOUTH  DAILY  . diazepam (VALIUM) 5 MG tablet 1-2 tabs every 8 hours if needed for tremor  . finasteride (PROSCAR) 5 MG tablet Take 5 mg by mouth daily.  . fluticasone (FLONASE) 50 MCG/ACT nasal  spray Instill 1 spray in each  nostril daily  . Fluticasone-Umeclidin-Vilant (TRELEGY ELLIPTA) 100-62.5-25 MCG/INH AEPB Inhale 1 puff into the lungs daily.  . isosorbide mononitrate (IMDUR) 60 MG 24 hr tablet TAKE 1 TABLET BY MOUTH  DAILY  . levothyroxine (SYNTHROID, LEVOTHROID) 50 MCG tablet TAKE 1 TABLET BY MOUTH  DAILY BEFORE BREAKFAST  . NON FORMULARY at bedtime. CPAP And during the day as needed  . potassium chloride SA (K-DUR,KLOR-CON) 20 MEQ tablet Take 1 tablet (20 mEq total) by mouth daily.  . sacubitril-valsartan (ENTRESTO) 49-51 MG Take 1 tablet by mouth 2 (two) times daily.  Marland Kitchen torsemide (DEMADEX) 20 MG tablet Take 80mg  every morning. Take 40mg  every other evening.  . [DISCONTINUED] nitroGLYCERIN (NITROSTAT) 0.4 MG SL tablet DISSOLVE ONE TABLET UNDER  THE TONGUE EVERY 5 MINUTES  AS NEEDED FOR CHEST PAIN  (UP TO 3 TABLETS IN 15  MINUTES THEN CALL 911)  . [DISCONTINUED] spironolactone (ALDACTONE) 25 MG tablet TAKE 1 TABLET BY MOUTH  DAILY   No  facility-administered encounter medications on file as of 05/20/2018.      Current Examination:  Behavioral Observations:  Appearance: Neatly and appropriately dressed and groomed Gait: Ambulated with a cane, mild instability Speech: Fluent; normal rate, rhythm and volume. No significant word finding difficulty observed during conversational speech. Thought process: Linear, goal directed Affect: Full, bright, jovial Interpersonal: Pleasant, appropriate Orientation: Oriented to person, place and most aspects of time (one day off on the current date). Accurately named the current President and his predecessor.   Tests Administered: . Test of Premorbid Functioning (TOPF) . Wechsler Adult Intelligence Scale-Fourth Edition (WAIS-IV): Similarities, Block Design,  and Digit Span subtests . Wechsler Memory Scale-Fourth Edition (WMS-IV) Older Adult Version (ages 82-90): Logical Memory I, II and Recognition subtests  . [employer] Verbal Learning Test - 2nd Edition (CVLT-2) Short Form . Repeatable Battery for the Assessment of Neuropsychological Status (RBANS) Form A:  Figure Copy and Recall subtests and Semantic Fluency subtest . Boston Naming Test (BNT) . Boston Diagnostic Aphasia Examination: Complex Ideational Material subtest . Controlled Oral Word Association Test (COWAT) . Trail Making Test A and B . Clock drawing test . Symbol Digit Modalities Test (SDMT) - Oral only . Beck Depression Inventory - 2nd Edition (BDI-II) . Generalized Anxiety Disorder - 7 item screener (GAD-7) . Parkinson's Disease Questionnaire for tremor (PDQ-39)  Test Results: Note: Standardized scores are presented only for use by appropriately trained professionals and to allow for any future test-retest comparison. These scores should not be interpreted without consideration of all the information that is contained in the rest of the report. The most recent standardization samples from the test publisher or other  sources were used whenever possible to derive standard scores; scores were corrected for age, gender, ethnicity and education when available.   Test Scores:  Test Name Raw Score Standardized Score Descriptor  TOPF 51/70 SS= 108 Average  WAIS-IV Subtests     Similarities 27/36 ss= 12 High average  Block Design 29/66 ss= 10 Average  Digit Span Forward 12/16 ss= 13 High average  Digit Span Backward 4/16 ss= 5 Borderline  WMS-IV Subtests     LM I 20/53 ss= 6 Low average  LM II 7/39 ss= 5 Borderline  LM II Recognition 15/23 Cum %: 10-16 Below average  RBANS Subtests     Figure Copy 2/20 Z < -5 Severely impaired  Semantic Fluency 15 Z= -0.9 Low average  CVLT-II Scores     Trial 1 5/9 Z=  0 Average  Trial 4 6/9 Z= -1 Low average  Trials 1-4 total 22/36 T= 47 Average  SD Free Recall 4/9 Z= -1.5 Borderline  LD Free Recall 4/9 Z= -0.5 Average  LD Cued Recall 5/9 Z= -0.5 Average  Recognition Discriminability 9/9 hits, 0 false positives Z= 1 High average  Forced Choice Recognition 9/9  WNL  BNT 58/60 T= 57 High average  BDAE Subtest     Complex Ideational Material 12/12  WNL  COWAT-FAS 55 T= 61 High average  COWAT-Animals 17 T= 47 Average  Trail Making Test A - Oral 7" 0 errors    Trail Making Test B - Oral 124" 2 errors T= 48 Average  Clock Drawing   WNL  SDMT - Oral 29/110 Z= -1.8 Borderline  BDI-II 6/63  WNL  GAD-7 0/21  WNL  PDQ-39     Mobility 72.5%    Activities of Daily Living 66.6%    Emotional Well Being 41.66%    Stigma 100%    Social Support 0    Cognitive Impairment 18.75%    Communication 0    Bodily Discomfort 33.3%        Description of Test Results:  Premorbid verbal intellectual abilities were estimated to have been within the average range based on a test of word reading. Psychomotor processing speed was unable to be assessed due to his inability to control tremor and write. On a test of processing speed without a motor component, her performed within the  borderline range. Basic auditory attention was high average while more complex auditory memory (ie working memory) was borderline. Visual-spatial construction was average on a task that did not require writing but did require manipulation of three dimensional blocks. Meanwhile, on a task requiring him to draw a complex geometric figure, his performance was severely impaired due to tremor. Language abilities were intact. Specifically, confrontation naming was high average, and semantic verbal fluency was average. Auditory comprehension of complex ideational material was intact. With regard to verbal memory, encoding and acquisition of non-contextual information (i.e., word list) was average. After a brief distracter task, free recall was borderline (4/9 items). After a delay, free recall was average (4/9 items). Cued recall was average (5/9 items). Performance on a yes/no recognition task was high average with 100% accuracy. On another verbal memory test, encoding and acquisition of contextual auditory information (i.e., short stories) was low average. After a delay, free recall was borderline. Performance on a yes/no recognition task was below average. Executive functioning was intact overall. Mental flexibility and set-shifting were average on an oral version of Trails B. Verbal fluency with phonemic search restrictions was high average. Verbal abstract reasoning was high average. Performance on a clock drawing task was within normal limits when accounting for tremor. On a self-report measure of mood, the patient's responses were not indicative of clinically significant depression at the present time. He endorsed only fatigue, loss of energy, changes in sleep pattern and loss of libido. He denied suicidal ideation or intention. On a self-report measure of anxiety, the patient did not endorse any symptoms of generalized anxiety at the present time. On a self-report measure assessing the impact of tremor on the  patient's quality of life and daily functioning, he reported significant stigma related to his tremor. He also reported significant impairment in mobility and in ADLs. He reported mild changes in emotional well being (mild worry about the future), cognitive functioning (has felt memory is bad) and bodily discomfort (muscle cramps and spasms).  He denied problems with communication or social support.   Clinical Impressions: Mild cognitive impairment.   Results of cognitive testing were largely within normal limits and commensurate with estimated premorbid baseline. He did demonstrate mild impairment in working memory, processing speed and difficulty learning and remembering new information that was presented to him only once via auditory modality. Meanwhile, his ability to learn and remember information that was repeated to him during the acquisition phase was intact. His cognitive profile is reflective of mild subcortical dysfunction. Based on his test performances and his functioning in everyday life, he does NOT meet diagnostic criteria for a dementia syndrome. 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 It is important to note that individuals with essential tremor commonly demonstrate mild cognitive impairment, for reasons that are not yet fully understood. This patient also has several vascular risk factors, and as a result, there could be chronic microvascular ischemia in the brain, which often causes mild cognitive changes of this type. His cognitive profile is NOT indicative of underlying Alzheimer's disease.  Furthermore, there is no evidence of primary psychiatric disorder including mood disorder, anxiety disorder or psychosis. There is no psychiatric history. There is no history of suicidal ideation or intention. He denies current emotional distress or mood disturbance. He does report feeling stigmatized due to his tremor. He reports having a strong social support network. He  appears to have healthy coping skills for adjusting to life changes.    IMPORTANT CONSIDERATIONS FOR DBS CANDIDACY   1. IS THE PATIENT EXPERIENCING COGNITIVE SYMPTOMS THAT FAR EXCEED WHAT IS EXPECTED FOR ET? No. Research indicates that individuals with essential tremor commonly experience mild cognitive impairment with a profile similar to this patient's.   2. IS THERE A SEPARATE NEUROLOGICAL PROCESS AT WORK? I do not see any evidence of a separate neurologic process.   3. WERE ANY PSYCHOSOCIAL STRESSORS IDENTIFIED WITHIN THE INDIVIDUAL AND/OR FAMILY BEYOND ET THAT MAY IMPACT UPON POST-OPERATIVE ADJUSTMENT? No, [name] appears to have good social support and coping skills.   4. CAN THIS PERSON COPE WITH THE STRESS OF SURGERY AND BE COMPLIANT AS AN AWAKE PARTICIPANT IN SURGERY? It is my impression that he would be able to tolerate all aspects of the DBS procedure.   5. CAN THIS PERSON PARTICIPATE IN THE MULTIPLE POST-OPERATIVE PROGRAMMING SESSIONS AND MEDICATION ADJUSTMENTS? Yes. He demonstrates good motivation for all aspects of participation. He also appears to have a good level of social support from family and friends.    6. FROM A NEUROPSYCHOLOGICAL PERSPECTIVE, DOES THIS PERSON APPEAR TO BE A GOOD CANDIDATE FOR DBS? Yes.    Thank you for your referral of [name]. Please feel free to contact me if you have any questions or concerns regarding this report.   Total time spent on this patient's case: 65 minutes for neurobehavioral status exam with psychologist (CPT code [phone]); 90 minutes of testing/scoring by psychometrician under psychologist's supervision (CPT codes [phone], [phone] units); 155 minutes for integration of patient data, interpretation of standardized test results and clinical data, clinical decision making, treatment planning and preparation of this report by psychologist (CPT codes [phone], [phone] units).

## 2018-05-22 ENCOUNTER — Encounter: Payer: Self-pay | Admitting: Cardiology

## 2018-05-28 NOTE — Progress Notes (Signed)
Subjective:   [name] was seen in consultation in the movement disorder clinic at the request of [name].  Pts PCP is [name], MD.  The evaluation is for tremor and memory change.  This patient is accompanied in the office by his spouse who supplements the history. The records that were made available to me were reviewed.  Tremor started approximately 10 years ago and involves the UE equally.  Tremor is most noticeable when he writes or eats.  He cannot use a fork any longer.   There is a family hx of tremor in both sisters and his sisters daughter and patients daughter started to have tremor.  [name] asked the patient to decrease his amiodarone at 10/2017 appt to 100 mg daily but he didn't do that so he asked him to do that again at his 12/18/17 appt.  Affected by caffeine:  Unknown - cut out caffeine (1 cup coffee but its decaf) Affected by alcohol:  Cut out EtOH x 20 years ago Affected by stress:  No. Affected by fatigue:  No. Spills soup if on spoon:  Yes.  , uses straw for soup Spills glass of liquid if full:  Yes.   Affects ADL's (tying shoes, brushing teeth, etc):  Yes.   (trouble tying shoes; able to shave with blade)   Current medications that may exacerbate tremor:  Amiodarone (been on this for 10 years); albuterol  Outside reports reviewed: historical medical records, lab reports and office notes.  Memory is another c/o.    Living situation:  Pt lives with their spouse.  The patient does not do the finances in the home - wife has always done it.  The patient does drive but wife does most of driving.   The patient does not cook; wife does that.    ADL's:  The patient is able to perform hisown ADL's. The patient self medicates.  He prepares his own pill box.  The patients bladder and bowel are  under good control.   Behavior:   There have been no behavioral changes over the years.    Identified concerns:  Family and patient are concerned about short term  memory issues and ability to remember appointments/meeting. Pt states that he makes no effort to remember appointments/meetings because his wife will remember them and "I'm going whether I want to or not."  States that he knows that his wife will keep up to date with the meetings/appointments.  He reads 6 hours per day and is currently studying spanish and trying to learn that.  05/29/18 update: Patient is seen today in follow-up for tremor.  Patient is accompanied by his wife who supplements the history.  Patient has expressed interest in DBS therapy.  Because of that, the patient underwent neurocognitive testing on May 20, 2018.  Those records are reviewed.  There was evidence of mild cognitive impairment.  There was no evidence of dementia.  Patient has seen cardiology and pulmonary since our last visit.  Cardiology has reduced his amiodarone as far as they can reduce it.  It was felt that he needed a low dose of amiodarone.  [name] stated that he was stable from a cardiovascular standpoint to have DBS, but would need pulmonary clearance given COPD.  He did see [name] and asked him about this, but I do not see the answer that he received in terms of whether or not he would agree to this type of surgery from a  pulmonary standpoint.  Patient states that [name] did give him clearance.  Patient expresses desire to proceed with possible DBS surgery.    Allergies  Allergen Reactions  . Tussionex Pennkinetic Er [Hydrocod Polst-Cpm Polst Er] Other (See Comments)    UNSPECIFIED REACTION  > "caused prostate problems"  . Ace Inhibitors Cough    Outpatient Encounter Medications as of 05/29/2018  Medication Sig  . albuterol (PROVENTIL HFA;VENTOLIN HFA) 108 (90 BASE) MCG/ACT inhaler Inhale 2 puffs into the lungs every 6 (six) hours as needed for wheezing or shortness of breath.  Marland Kitchen albuterol (PROVENTIL) (2.5 MG/3ML) 0.083% nebulizer solution Take 3 mLs (2.5 mg total) by nebulization every 6 (six) hours.   Marland Kitchen amiodarone (PACERONE) 200 MG tablet Take 100 mg by mouth daily.  Marland Kitchen aspirin 81 MG tablet Take 81 mg by mouth daily.  Marland Kitchen atorvastatin (LIPITOR) 40 MG tablet TAKE 1 TABLET BY MOUTH  DAILY  . carvedilol (COREG) 25 MG tablet TAKE 1 TABLET BY MOUTH  TWICE A DAY WITH MEALS  . clopidogrel (PLAVIX) 75 MG tablet TAKE 1 TABLET BY MOUTH  DAILY  . diazepam (VALIUM) 5 MG tablet 1-2 tabs every 8 hours if needed for tremor  . finasteride (PROSCAR) 5 MG tablet Take 5 mg by mouth daily.  . fluticasone (FLONASE) 50 MCG/ACT nasal spray Instill 1 spray in each  nostril daily  . Fluticasone-Umeclidin-Vilant (TRELEGY ELLIPTA) 100-62.5-25 MCG/INH AEPB Inhale 1 puff into the lungs daily.  . isosorbide mononitrate (IMDUR) 60 MG 24 hr tablet TAKE 1 TABLET BY MOUTH  DAILY  . levothyroxine (SYNTHROID, LEVOTHROID) 50 MCG tablet TAKE 1 TABLET BY MOUTH  DAILY BEFORE BREAKFAST  . NON FORMULARY at bedtime. CPAP And during the day as needed  . potassium chloride SA (K-DUR,KLOR-CON) 20 MEQ tablet Take 1 tablet (20 mEq total) by mouth daily.  . sacubitril-valsartan (ENTRESTO) 49-51 MG Take 1 tablet by mouth 2 (two) times daily.  Marland Kitchen spironolactone (ALDACTONE) 25 MG tablet TAKE 1 TABLET BY MOUTH  DAILY  . torsemide (DEMADEX) 20 MG tablet Take 80mg  every morning. Take 40mg  every other evening.  . nitroGLYCERIN (NITROSTAT) 0.4 MG SL tablet Place 1 tablet (0.4 mg total) under the tongue every 5 (five) minutes as needed for chest pain. (Patient not taking: Reported on 05/29/2018)   No facility-administered encounter medications on file as of 05/29/2018.     Past Medical History:  Diagnosis Date  . AAA (abdominal aortic aneurysm) ([place])    followed by Dr. Bridgett Larsson  . Adenomatous colon polyp 01/2004  . Anemia    hx  . Automatic implantable cardioverter-defibrillator in situ   . AVM (arteriovenous malformation)   . BPH (benign prostatic hypertrophy)   . CAD (coronary artery disease)    s/p CABGx2 in 1996  . Carotid artery stenosis      LCEA - Dr. Bridgett Larsson in 2013  . CHF (congestive heart failure) ([place])   . Complication of anesthesia    claustrophobic, unabe to lie on back more than 4 hours at time due to back  . COPD (chronic obstructive pulmonary disease) ([place])   . Diabetes mellitus    DIET CONTROLLED- pt states that this was a misdiagnosis, he was treated while in the hospital  but returned home, loss a massive amount of weight and he has not had a problem with his blood sugar since. States everything has been normal for about 3 years.  . Diverticulosis   . Dyspnea   . Dysrhythmia    ICD-defibrillator  .  Fatigue   . GERD (gastroesophageal reflux disease)   . H/O hiatal hernia   . History of radiation therapy 04/09/17-04/17/17   SBRT right lung 54 Gy in 3 fractions  . HLD (hyperlipidemia)   . Hypertension   . Hypothyroidism   . Ischemic cardiomyopathy   . Myocardial infarction ([place])   . NSCL ca   . OSA on CPAP    AHI durign total sleep 14.69/hr, during REM 50.91/hr  . Peripheral vascular disease ([facility])    LCEA, L renal artery stent, bilat iliac stents, R SFA stenosis  . Tremor   . Ventricular tachycardia ([place]) 09/09/2014   Amiodarone was started after appropriate defibrillator shocks for ventricular tachycardia in October 2008    Past Surgical History:  Procedure Laterality Date  . ABDOMINAL AORTIC ENDOVASCULAR STENT GRAFT [name] 01/06/2014   Procedure: ABDOMINAL AORTIC ENDOVASCULAR STENT GRAFT- GORE; ULTRASOUND GUIDED;  Surgeon: Conrad Union Dale, MD;  Location: [place];  Service: Vascular;  Laterality: [name];  . ANGIOPLASTY     BILATERAL  LE  W/STENTS  . BI-VENTRICULAR IMPLANTABLE CARDIOVERTER DEFIBRILLATOR UPGRADE [name] 10/09/2014   Procedure: BI-VENTRICULAR IMPLANTABLE CARDIOVERTER DEFIBRILLATOR UPGRADE;  Surgeon: Evans Lance, MD;  Location: [facility] CATH LAB;  Service: Cardiovascular;  Laterality: [name];  . BIV ICD GENERTAOR CHANGE OUT  10/09/2014   UPGRADE TO BIV        BY DR Lovena Le  . CARDIAC CATHETERIZATION  09/16/2007    occlusion of both vein grafts, no significant LAD disease or diagonal disease, Cfx collaterals from the left, 70% in-stent restenosis of L renal artery, normal L main, RCA occluded ostially (Dr. Adora Fridge)  . CARDIAC CATHETERIZATION  10/03/2002   SVG sequentially to OM1 & OM2 totally occluded at ostium, SVG to PDA totally occluded within previously placed prox vein graft stent, smal distal AAA, bialt iliac stents with 30% left end-stent restenosis and 50% right end-stent restnosis(Dr. Gerrie Nordmann)  . CARDIAC CATHETERIZATION  06/11/1998   L main with 20% narrowing in distal 1/3; LAD with 1st diagonla having 70% ostial narrowing, 2nd diagonal with 40% narrowing in prox third, LIMA & RIMA widely patent; in-stent restenosis of RCIA with successful PTA and new prox SVTRCA stent for residual disease (Dr. Marella Chimes)  . CARDIAC DEFIBRILLATOR PLACEMENT  06/2005   Guidant Vitality HE - ischemic cardiomyopathy - Dr. Marella Chimes  . CAROTID ENDARTERECTOMY Left 01/04/12  . CORONARY ANGIOPLASTY  01/08/2004   cutting balloon atherectomy & percutaneous intervention of RCIA in-stent restenosis (Dr. Marella Chimes)  . CORONARY ARTERY BYPASS GRAFT  11/04/1985   x2 - PDA and sequential DX-OM (Dr. Redmond Pulling)  . ENDARTERECTOMY  01/04/2012   Procedure: ENDARTERECTOMY CAROTID;left  Surgeon: Hinda Lenis, MD;  Location: [place];  Service: Vascular;  Laterality: Left;  with patch angioplasty  . FUDUCIAL PLACEMENT [name] 02/23/2017   Procedure: PLACEMENT OF FUDUCIAL;  Surgeon: [name], MD;  Location: [place];  Service: Thoracic;  Laterality: [name];  . ICD GENERATOR CHANGE  05/02/2010   [employer]  . ILIAC ARTERY STENT Bilateral 03/1997   and L SFA PTA  . Iron infusion  June 16, 2012  . LEFT HEART CATHETERIZATION WITH CORONARY ANGIOGRAM [name] 07/06/2014   Procedure: LEFT HEART CATHETERIZATION WITH CORONARY ANGIOGRAM;  Surgeon: Peter M Martinique, MD;  Location: [facility] CATH LAB;  Service: Cardiovascular;  Laterality: [name];   . NM MYOCAR PERF WALL MOTION  09/2012   lexiscan myoview - mod-severe perfusion defect r/t infarct or scar w/mild periinfarct ishcemia in  basal inferior, mid inferior, apical inferior, basal inferolateral & mid inferoalteral regions - EF 21% low risk scan  . POLYPECTOMY    . RENAL ARTERY STENT Left 03/24/2004   6x50mm Genesis stent (Dr. Marella Chimes)  . RIGHT/LEFT HEART CATH AND CORONARY ANGIOGRAPHY [name] 12/28/2017   Procedure: RIGHT/LEFT HEART CATH AND CORONARY ANGIOGRAPHY;  Surgeon: Larey Dresser, MD;  Location: [place] CV LAB;  Service: Cardiovascular;  Laterality: [name];  . TRANSTHORACIC ECHOCARDIOGRAM  12/2012   EF 30-35; LV mod to severely dilated, mod concentric hypertrophy, severe hypokinesis of inferolateral myocardium, moderate hypokineis of anteroseptal region, grade 1 diastolic dysfunction; mod MR; LA mod-severely dialted; RV mod dialted; RA mildly dilated; PA peak pressure 34mmHg  . VIDEO BRONCHOSCOPY WITH ENDOBRONCHIAL NAVIGATION [name] 02/23/2017   Procedure: VIDEO BRONCHOSCOPY WITH ENDOBRONCHIAL NAVIGATION;  Surgeon: [name], MD;  Location: [place];  Service: Thoracic;  Laterality: [name];    Social History   Socioeconomic History  . Marital status: Married    Spouse name: Not on file  . Number of children: 2  . Years of education: Not on file  . Highest education level: Not on file  Occupational History  . Occupation: retired Solicitor   Social Needs  . Financial resource strain: Not on file  . Food insecurity:    Worry: Not on file    Inability: Not on file  . Transportation needs:    Medical: Not on file    Non-medical: Not on file  Tobacco Use  . Smoking status: Former Smoker    Packs/day: 1.50    Years: 40.00    Pack years: 60.00    Types: Cigarettes, Pipe    Last attempt to quit: 12/11/2002    Years since quitting: 15.4  . Smokeless tobacco: Never Used  Substance and Sexual Activity  . Alcohol use: No    Alcohol/week: 0.0 oz  . Drug  use: No  . Sexual activity: Not Currently  Lifestyle  . Physical activity:    Days per week: Not on file    Minutes per session: Not on file  . Stress: Not on file  Relationships  . Social connections:    Talks on phone: Not on file    Gets together: Not on file    Attends religious service: Not on file    Active member of club or organization: Not on file    Attends meetings of clubs or organizations: Not on file    Relationship status: Not on file  . Intimate partner violence:    Fear of current or ex partner: Not on file    Emotionally abused: Not on file    Physically abused: Not on file    Forced sexual activity: Not on file  Other Topics Concern  . Not on file  Social History Narrative  . Not on file    Family Status  Relation Name Status  . Mother  Deceased  . Father  Deceased  . Sister  Deceased  . Sister  Deceased  . MGM  Deceased  . Daughter  Alive  . MGF  Deceased  . PGM  Deceased  . PGF  Deceased  . Son  Alive    Review of Systems A complete 10 system ROS was obtained and was negative apart from what is mentioned.   Objective:   VITALS:   Vitals:   05/29/18 1110  BP: 120/76  Pulse: 74  SpO2: 93%  Weight: 277 lb (125.6 kg)  Height: 5\' 9"  (1.753 m)   Gen:  Appears stated age and in NAD. HEENT:  Normocephalic, atraumatic. The mucous membranes are moist. The superficial temporal arteries are without ropiness or tenderness. Cardiovascular: Regular rate and rhythm. Lungs: Clear to auscultation bilaterally. Neck: There are no carotid bruits noted bilaterally.  NEUROLOGICAL:  Orientation: some of MoCA affected by tremor Montreal Cognitive Assessment  01/07/2018  Visuospatial/ Executive (0/5) 0  Naming (0/3) 3  Attention: Read list of digits (0/2) 1  Attention: Read list of letters (0/1) 1  Attention: Serial 7 subtraction starting at 100 (0/3) 2  Language: Repeat phrase (0/2) 2  Language : Fluency (0/1) 1  Abstraction (0/2) 2  Delayed Recall  (0/5) 3  Orientation (0/6) 6  Total 21  Adjusted Score (based on education) 21    GEN:  The patient appears stated age and is in NAD. HEENT:  Normocephalic, atraumatic.  The mucous membranes are moist. The superficial temporal arteries are without ropiness or tenderness. CV:  RRR Lungs:  CTAB Neck/HEME:  There are no carotid bruits bilaterally.  Movement examination: Tone: There is normal tone in the upper and lower extremities.   Abnormal movements: There is moderate tremor in the left hand and mild to moderate on the right.  Archimedes spirals are actually drawn markedly better today with the right hand than they were in the past. Coordination:  There is no decremation with RAM's, with any form of RAMS, including alternating supination and pronation of the forearm, hand opening and closing, finger taps, heel taps and toe taps. Gait and Station: The patient pushes off of the chair to arise.  He ambulates with a cane.  Gait is somewhat antalgic.   Lab Results  Component Value Date   VITAMINB12 304 01/07/2018   Lab Results  Component Value Date   TSH 4.030 04/12/2018     Chemistry      Component Value Date/Time   NA 140 04/12/2018 0930   NA 145 02/16/2015 1427   K 4.6 04/12/2018 0930   K 4.2 02/16/2015 1427   CL 102 04/12/2018 0930   CL 94 (L) 10/02/2012 0937   CO2 29 04/12/2018 0930   CO2 27 02/16/2015 1427   BUN 34 (H) 04/12/2018 0930   BUN 18.5 02/16/2015 1427   CREATININE 1.43 (H) 04/12/2018 0930   CREATININE 1.0 02/16/2015 1427      Component Value Date/Time   CALCIUM 9.1 04/12/2018 0930   CALCIUM 9.5 02/16/2015 1427   ALKPHOS 63 04/12/2018 0930   ALKPHOS 108 02/16/2015 1427   AST 17 04/12/2018 0930   AST 17 02/16/2015 1427   ALT 16 (L) 04/12/2018 0930   ALT 17 02/16/2015 1427   BILITOT 0.8 04/12/2018 0930   BILITOT 0.42 02/16/2015 1427          Assessment/Plan:   1.  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  His wife indicates that tremor started around the time as amiodarone was started.   I told the patient that about 1/3 of patients who are on amiodarone will have some degree of tremor.  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   Cardiology records indicate that amiodarone is at  the lowest dose possible at this point in time.  Cardiology did clear him for DBS surgery.  He would need clearance from pulmonary given COPD, although patient states that he actually got this clearance already.  -I talked to the patient about the logistics associated with DBS therapy.  I talked to the patient about risks/benefits/side effects of DBS therapy.  We talked about risks which included but were not limited to infection, paralysis, intraoperative seizure, death, stroke, bleeding around the electrode.   I talked to patient about fiducial placement 1 week prior to DBS therapy.  I talked to the patient about what to expect in the operating room, including the fact that this is an awake surgery.  We talked about battery placement as well as which is done under general anesthesia, generally approximately one week following the initial surgery.  We also talked about the fact that the patient will need to be off of medications for surgery.  The patient and family were given the opportunity to ask questions, which they did, and I answered them to the best of my ability today.  After this discussion, the patient ultimately decided he would like a unilateral left VIM surgery (even though that the left hand is more affected than the right, he is right-handed).  We discussed various makers of the device and ultimately determined that he would be best fit with a Product/process development scientist.  He cannot have an MRI because of  his permanent pacemaker and defibrillator, so would need to have preop CT.  -We will arrange for the patient to have an appointment with Dr. Vertell Limber for consultation.  -If the above goes well, we will arrange preop CT and preop patient video.  2.  Mild B12 deficiency  -Was still somewhat deficient when last checked in January.  On supplement now.  3.  Much greater than 50% of this visit was spent in counseling and coordinating care.  Total face to face time:  40 min  CC:  [name], MD

## 2018-05-29 ENCOUNTER — Encounter: Payer: Self-pay | Admitting: Neurology

## 2018-05-29 ENCOUNTER — Ambulatory Visit (INDEPENDENT_AMBULATORY_CARE_PROVIDER_SITE_OTHER): Payer: Medicare Other | Admitting: Neurology

## 2018-05-29 ENCOUNTER — Ambulatory Visit: Payer: 59 | Admitting: Neurology

## 2018-05-29 VITALS — BP 120/76 | HR 74 | Ht 69.0 in | Wt 277.0 lb

## 2018-05-29 DIAGNOSIS — R251 Tremor, unspecified: Secondary | ICD-10-CM | POA: Diagnosis not present

## 2018-05-29 DIAGNOSIS — I255 Ischemic cardiomyopathy: Secondary | ICD-10-CM | POA: Diagnosis not present

## 2018-06-05 DIAGNOSIS — H524 Presbyopia: Secondary | ICD-10-CM | POA: Diagnosis not present

## 2018-06-05 DIAGNOSIS — Z961 Presence of intraocular lens: Secondary | ICD-10-CM | POA: Diagnosis not present

## 2018-06-05 DIAGNOSIS — H26492 Other secondary cataract, left eye: Secondary | ICD-10-CM | POA: Diagnosis not present

## 2018-06-06 DIAGNOSIS — Z6841 Body Mass Index (BMI) 40.0 and over, adult: Secondary | ICD-10-CM | POA: Diagnosis not present

## 2018-06-06 DIAGNOSIS — G250 Essential tremor: Secondary | ICD-10-CM | POA: Diagnosis not present

## 2018-06-06 DIAGNOSIS — I10 Essential (primary) hypertension: Secondary | ICD-10-CM | POA: Diagnosis not present

## 2018-06-10 ENCOUNTER — Ambulatory Visit
Admission: RE | Admit: 2018-06-10 | Discharge: 2018-06-10 | Disposition: A | Discharge status: Home / Self Care | Payer: Medicare Other | Source: Ambulatory Visit | Attending: Radiation Oncology | Admitting: Radiation Oncology

## 2018-06-10 ENCOUNTER — Encounter: Payer: Self-pay | Admitting: Radiation Oncology

## 2018-06-10 ENCOUNTER — Other Ambulatory Visit: Payer: Self-pay

## 2018-06-10 VITALS — BP 140/79 | HR 76 | Temp 98.7°F | Resp 20 | Wt 267.4 lb

## 2018-06-10 DIAGNOSIS — Z7902 Long term (current) use of antithrombotics/antiplatelets: Secondary | ICD-10-CM | POA: Diagnosis not present

## 2018-06-10 DIAGNOSIS — R5383 Other fatigue: Secondary | ICD-10-CM | POA: Diagnosis not present

## 2018-06-10 DIAGNOSIS — Z79890 Hormone replacement therapy (postmenopausal): Secondary | ICD-10-CM | POA: Insufficient documentation

## 2018-06-10 DIAGNOSIS — Z923 Personal history of irradiation: Secondary | ICD-10-CM | POA: Insufficient documentation

## 2018-06-10 DIAGNOSIS — I700 Atherosclerosis of aorta: Secondary | ICD-10-CM | POA: Diagnosis not present

## 2018-06-10 DIAGNOSIS — R0609 Other forms of dyspnea: Secondary | ICD-10-CM | POA: Insufficient documentation

## 2018-06-10 DIAGNOSIS — Z79899 Other long term (current) drug therapy: Secondary | ICD-10-CM | POA: Insufficient documentation

## 2018-06-10 DIAGNOSIS — J439 Emphysema, unspecified: Secondary | ICD-10-CM | POA: Insufficient documentation

## 2018-06-10 DIAGNOSIS — Z7982 Long term (current) use of aspirin: Secondary | ICD-10-CM | POA: Diagnosis not present

## 2018-06-10 DIAGNOSIS — Z09 Encounter for follow-up examination after completed treatment for conditions other than malignant neoplasm: Secondary | ICD-10-CM | POA: Diagnosis not present

## 2018-06-10 DIAGNOSIS — Z08 Encounter for follow-up examination after completed treatment for malignant neoplasm: Secondary | ICD-10-CM | POA: Diagnosis not present

## 2018-06-10 DIAGNOSIS — R911 Solitary pulmonary nodule: Secondary | ICD-10-CM | POA: Diagnosis not present

## 2018-06-10 NOTE — Progress Notes (Signed)
Radiation Oncology         [phone]) [phone] ________________________________  Name: [name] MRN: [id number]  Date: 06/10/2018  DOB: 06/10/1946  Follow-Up Visit Note  CC: [name], MD  [name], *    ICD-10-CM   1. Nodule of right lung R91.1     Diagnosis: Enlarging right lower lobe pulmonarynodule   Interval Since Last Radiation: 4 months  02/04/18-02/08/18: 54 Gy to the right lower lobe in 3 fractions (SBRT)  Narrative:  The patient returns today for routine follow-up since completion of his radiation treatments on 02/08/2018. He is accompanied by his wife today. He has been doing well overall.   Since his last visit to the office, he underwent CT chest wo contrast on 03/18/2018 with results of: Stable changes of external beam radiation within the right upper lobe. Small right upper lobe pulmonary nodules are not significantly changed in the interval. Stable appearance of previously described progressive nodules within the right lower lobe and left upper lobe. Aortic Atherosclerosis (ICD10-I70.0) and Emphysema (ICD10-J43.9).  On review of systems, he reports fatigue and DOE. He notes that he takes more naps more frequently since completion of his radiation treatment. he denies SOB, hemoptysis, CP, and any other symptoms. Pertinent positives are listed and detailed within the above HPI. His wife notes that he will be having a surgery to aid with decreasing his resting tremors in the near future.                     ALLERGIES:  is allergic to tussionex pennkinetic er [hydrocod polst-cpm polst er] and ace inhibitors.  Meds: Current Outpatient Medications  Medication Sig Dispense Refill  . albuterol (PROVENTIL HFA;VENTOLIN HFA) 108 (90 BASE) MCG/ACT inhaler Inhale 2 puffs into the lungs every 6 (six) hours as needed for wheezing or shortness of breath. 3 Inhaler 4  . albuterol (PROVENTIL) (2.5 MG/3ML) 0.083% nebulizer solution Take 3 mLs (2.5 mg total) by  nebulization every 6 (six) hours. 120 mL 3  . amiodarone (PACERONE) 200 MG tablet Take 100 mg by mouth daily.    Marland Kitchen aspirin 81 MG tablet Take 81 mg by mouth daily.    Marland Kitchen atorvastatin (LIPITOR) 40 MG tablet TAKE 1 TABLET BY MOUTH  DAILY 90 tablet 3  . carvedilol (COREG) 25 MG tablet TAKE 1 TABLET BY MOUTH  TWICE A DAY WITH MEALS 180 tablet 3  . clopidogrel (PLAVIX) 75 MG tablet TAKE 1 TABLET BY MOUTH  DAILY 90 tablet 3  . diazepam (VALIUM) 5 MG tablet 1-2 tabs every 8 hours if needed for tremor 30 tablet 5  . finasteride (PROSCAR) 5 MG tablet Take 5 mg by mouth daily.    . fluticasone (FLONASE) 50 MCG/ACT nasal spray Instill 1 spray in each  nostril daily 32 g 3  . Fluticasone-Umeclidin-Vilant (TRELEGY ELLIPTA) 100-62.5-25 MCG/INH AEPB Inhale 1 puff into the lungs daily. 30 each 5  . isosorbide mononitrate (IMDUR) 60 MG 24 hr tablet TAKE 1 TABLET BY MOUTH  DAILY 90 tablet 2  . levothyroxine (SYNTHROID, LEVOTHROID) 50 MCG tablet TAKE 1 TABLET BY MOUTH  DAILY BEFORE BREAKFAST 90 tablet 1  . nitroGLYCERIN (NITROSTAT) 0.4 MG SL tablet Place 1 tablet (0.4 mg total) under the tongue every 5 (five) minutes as needed for chest pain. 25 tablet 0  . NON FORMULARY at bedtime. CPAP And during the day as needed    . potassium chloride SA (K-DUR,KLOR-CON) 20 MEQ tablet Take 1 tablet (20  mEq total) by mouth daily. 90 tablet 3  . sacubitril-valsartan (ENTRESTO) 49-51 MG Take 1 tablet by mouth 2 (two) times daily. 60 tablet 3  . spironolactone (ALDACTONE) 25 MG tablet TAKE 1 TABLET BY MOUTH  DAILY 90 tablet 3  . torsemide (DEMADEX) 20 MG tablet Take 80mg  every morning. Take 40mg  every other evening. 540 tablet 3   No current facility-administered medications for this encounter.     Physical Findings: The patient is in no acute distress. Patient is alert and oriented.  weight is 267 lb 6.4 oz (121.3 kg). His temperature is 98.7 F (37.1 C). His blood pressure is 140/79 and his pulse is 76. His respiration is 20  and oxygen saturation is 94%. .    Lungs are clear to auscultation bilaterally. Heart has regular rate and rhythm. No palpable cervical, supraclavicular, or axillary adenopathy. Abdomen soft, non-tender, normal bowel sounds.   Lab Findings: Lab Results  Component Value Date   WBC 6.8 12/28/2017   HGB 14.9 12/28/2017   HCT 45.9 12/28/2017   MCV 96.8 12/28/2017   PLT 135 (L) 12/28/2017    Radiographic Findings: No results found.  Impression:  Clinically stable. Patient would like to proceed with repeat chest CT scan to make sure that everything is stable prior to proceeding with brain procedure.   Plan: chest CT scan in the next week. Follow up with radiation oncology in 6 months.   ____________________________________  This document serves as a record of services personally performed by Gery Pray, MD. It was created on his behalf by Steva Colder, a trained medical scribe. The creation of this record is based on the scribe's personal observations and the provider's statements to them. This document has been checked and approved by the attending provider.

## 2018-06-10 NOTE — Progress Notes (Signed)
Pt is here for follow up appointment with Dr. Sondra Come. Pt states fatigue is still moderate. Pt denies pain, difficulty swallowing, or cough. Pt denies hemoptysis or coughing up any phlegm. Pt accompanied by wife.   BP 140/79   Pulse 76   Temp 98.7 F (37.1 C)   Resp 20   Wt 267 lb 6.4 oz (121.3 kg)   SpO2 94%   BMI 39.49 kg/m   Wt Readings from Last 3 Encounters:  06/10/18 267 lb 6.4 oz (121.3 kg)  05/29/18 277 lb (125.6 kg)  04/12/18 271 lb (122.9 kg)

## 2018-06-17 ENCOUNTER — Encounter: Payer: Medicare Other | Admitting: Psychology

## 2018-06-19 ENCOUNTER — Ambulatory Visit (HOSPITAL_COMMUNITY)
Admission: RE | Admit: 2018-06-19 | Discharge: 2018-06-19 | Disposition: A | Discharge status: Home / Self Care | Payer: Medicare Other | Source: Ambulatory Visit | Attending: Cardiology | Admitting: Cardiology

## 2018-06-19 ENCOUNTER — Other Ambulatory Visit: Payer: Self-pay

## 2018-06-19 ENCOUNTER — Encounter (HOSPITAL_COMMUNITY): Payer: Self-pay | Admitting: Cardiology

## 2018-06-19 ENCOUNTER — Telehealth: Payer: Self-pay | Admitting: Neurology

## 2018-06-19 VITALS — BP 120/58 | HR 77 | Wt 272.0 lb

## 2018-06-19 DIAGNOSIS — Z7901 Long term (current) use of anticoagulants: Secondary | ICD-10-CM | POA: Diagnosis not present

## 2018-06-19 DIAGNOSIS — K219 Gastro-esophageal reflux disease without esophagitis: Secondary | ICD-10-CM | POA: Insufficient documentation

## 2018-06-19 DIAGNOSIS — I255 Ischemic cardiomyopathy: Secondary | ICD-10-CM | POA: Diagnosis not present

## 2018-06-19 DIAGNOSIS — E785 Hyperlipidemia, unspecified: Secondary | ICD-10-CM | POA: Diagnosis not present

## 2018-06-19 DIAGNOSIS — C3490 Malignant neoplasm of unspecified part of unspecified bronchus or lung: Secondary | ICD-10-CM | POA: Diagnosis not present

## 2018-06-19 DIAGNOSIS — Z7982 Long term (current) use of aspirin: Secondary | ICD-10-CM | POA: Diagnosis not present

## 2018-06-19 DIAGNOSIS — I2510 Atherosclerotic heart disease of native coronary artery without angina pectoris: Secondary | ICD-10-CM | POA: Insufficient documentation

## 2018-06-19 DIAGNOSIS — E039 Hypothyroidism, unspecified: Secondary | ICD-10-CM | POA: Insufficient documentation

## 2018-06-19 DIAGNOSIS — Z79899 Other long term (current) drug therapy: Secondary | ICD-10-CM | POA: Diagnosis not present

## 2018-06-19 DIAGNOSIS — E119 Type 2 diabetes mellitus without complications: Secondary | ICD-10-CM | POA: Insufficient documentation

## 2018-06-19 DIAGNOSIS — I739 Peripheral vascular disease, unspecified: Secondary | ICD-10-CM

## 2018-06-19 DIAGNOSIS — J449 Chronic obstructive pulmonary disease, unspecified: Secondary | ICD-10-CM

## 2018-06-19 DIAGNOSIS — I110 Hypertensive heart disease with heart failure: Secondary | ICD-10-CM | POA: Diagnosis not present

## 2018-06-19 DIAGNOSIS — Z923 Personal history of irradiation: Secondary | ICD-10-CM | POA: Insufficient documentation

## 2018-06-19 DIAGNOSIS — Z951 Presence of aortocoronary bypass graft: Secondary | ICD-10-CM | POA: Insufficient documentation

## 2018-06-19 DIAGNOSIS — I5022 Chronic systolic (congestive) heart failure: Secondary | ICD-10-CM | POA: Insufficient documentation

## 2018-06-19 DIAGNOSIS — I714 Abdominal aortic aneurysm, without rupture: Secondary | ICD-10-CM | POA: Insufficient documentation

## 2018-06-19 DIAGNOSIS — Z9981 Dependence on supplemental oxygen: Secondary | ICD-10-CM | POA: Insufficient documentation

## 2018-06-19 DIAGNOSIS — I252 Old myocardial infarction: Secondary | ICD-10-CM | POA: Diagnosis not present

## 2018-06-19 DIAGNOSIS — N400 Enlarged prostate without lower urinary tract symptoms: Secondary | ICD-10-CM | POA: Insufficient documentation

## 2018-06-19 DIAGNOSIS — I472 Ventricular tachycardia: Secondary | ICD-10-CM | POA: Diagnosis not present

## 2018-06-19 DIAGNOSIS — Z7902 Long term (current) use of antithrombotics/antiplatelets: Secondary | ICD-10-CM | POA: Diagnosis not present

## 2018-06-19 DIAGNOSIS — Z87891 Personal history of nicotine dependence: Secondary | ICD-10-CM | POA: Insufficient documentation

## 2018-06-19 DIAGNOSIS — I5042 Chronic combined systolic (congestive) and diastolic (congestive) heart failure: Secondary | ICD-10-CM

## 2018-06-19 DIAGNOSIS — G4733 Obstructive sleep apnea (adult) (pediatric): Secondary | ICD-10-CM | POA: Insufficient documentation

## 2018-06-19 DIAGNOSIS — Z95810 Presence of automatic (implantable) cardiac defibrillator: Secondary | ICD-10-CM | POA: Insufficient documentation

## 2018-06-19 LAB — COMPREHENSIVE METABOLIC PANEL
ALT: 24 U/L (ref 0–44)
AST: 18 U/L (ref 15–41)
Albumin: 3.8 g/dL (ref 3.5–5.0)
Alkaline Phosphatase: 73 U/L (ref 38–126)
Anion gap: 9 (ref 5–15)
BUN: 39 mg/dL — ABNORMAL HIGH (ref 8–23)
CO2: 25 mmol/L (ref 22–32)
Calcium: 9.4 mg/dL (ref 8.9–10.3)
Chloride: 104 mmol/L (ref 98–111)
Creatinine, Ser: 1.63 mg/dL — ABNORMAL HIGH (ref 0.61–1.24)
GFR calc Af Amer: 47 mL/min — ABNORMAL LOW (ref >60)
GFR calc non Af Amer: 40 mL/min — ABNORMAL LOW (ref >60)
Glucose, Bld: 140 mg/dL — ABNORMAL HIGH (ref 70–99)
Potassium: 4.1 mmol/L (ref 3.5–5.1)
Sodium: 138 mmol/L (ref 135–145)
Total Bilirubin: 0.7 mg/dL (ref 0.3–1.2)
Total Protein: 6.6 g/dL (ref 6.5–8.1)

## 2018-06-19 LAB — TSH: TSH: 4.092 u[IU]/mL (ref 0.350–4.500)

## 2018-06-19 NOTE — Telephone Encounter (Signed)
Patient was seen in Dr. Melven Sartorius office on 06/06/18 at 3:30 pm. Notes requested.   Dr. Carles Collet Juluis Rainier.

## 2018-06-19 NOTE — Patient Instructions (Signed)
Labs today  We will contact you in 3 months to schedule your next appointment.  

## 2018-06-20 NOTE — Telephone Encounter (Signed)
Ok.  Make sure we get notes because I didn't hear anything from their office re: possibly scheduling sx

## 2018-06-20 NOTE — Progress Notes (Signed)
Patient ID: ISAIR INABINET, male   DOB: 09-Jun-1946, 72 y.o.   MRN: [id number] [facility]  06/20/2018 [name]   [birth year]  [id number]  Primary Physicia [name], MD Hematologist: Dr Alen Blew  Pulmonary: Dr Annamaria Boots Cardiology: [name]  [name] is a pleasant 72 y.o. with a past medical history significant for morbid obesity, systolic heart failure with an EF of 30-35% (January 2014) -> 20-25% (March 2015 - RV normal), VT on amio, CAD s/p CABG 1996, LBBB, COPD, obstructive sleep apnea on CPAP, AAA repair (January 2015) who was admitted to [facility] on 02/24/14 for acute on chronic HF and acute COPD exacerbation. He also had iron deficiency anemia.   Admitted to [facility] July 24 through July 06 2014 with chest pain.  CEs negative. Had a LHC with no change from previous LHC with recommendations to continue medical management. Discharge weight was 255 pounds.   LHC 07/06/14 --No significant change from previous studies.  Left anterior descending (LAD): The LAD is a large vessel. There is a 40% stenosis immediately after the takeoff of a large septal perforator. There are 2 large diagonal branches without significant disease. Left circumflex (LCx): The LCx is occluded proximally. There are left to left collaterals.  Right coronary artery (RCA): The RCA is occluded proximally immediately following the conus branch. There are right to right and left to right collaterals. SVGs from CABG known to be totally occluded.   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   CTA chest/abd/pelvis (3/16) with moderate emphysema, 1.3 cm nodule RUL, left SFA totally occluded, right SFA with significant stenosis, s/p endovascular AAA repair (stable).   Echo (5/17) with EF 20-25%, severe LV dilation, mild MR, normal RV size with mildly decreased systolic function.   He has a RUL nodule that is most likely lung cancer, he is being treated with radiation.   Echo (8/18) with EF 30%, basal-mid inferior  and inferolateral AK, severe LV dilation, normal RV, mild MR.   RHC/LHC was done in 1/19 due to worsening exertional dyspnea.  This showed normal filling pressures and preserved cardiac output.  There was 30-40% mLAD stenosis.  The LAD provided collaterals to the LCx and RCA territories.  The LCx, RCA, SVG-OM, and SVG-PDA were all chronically occluded (known from the past).  No new disease.   He saw neurology about his tremor.  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  However, he is on amiodarone which may be contributing.  He has been seeing Dr. Vertell Limber with plan for deep brain stimulator.   He has completed chest radiation for RLL pulmonary nodule.   ABIs in 2/19 at [facility] with 0.78 on right, 0.59 on left => left iliac > 50% stenosis.   He returns today for followup of CHF and CAD.  BP better since cutting back on Entresto, SBP 100s-110s at home.  Weight is stable.  Breathing stable, gets short of breath with inclines/stairs.  No chest pain.  No orthopnea/PND.  Overall feels good.  He is using his CPAP.   Labs 3/15 Cr 1.5 K 5.6 Labs 07/06/14 K 3.9 Creatinine  0.98 Labs 9/15 K 5.1, creatinine 0.96 Labs 11/15 K 3.8, creatinine 0.83, LDL 61, LFTs normal, TSH normal Labs 11/26/14 K 5.0 Creatinine 0.73 , LFTs normal, TSH normal,  Labs 12/08/14 K 3.4 Creatinine 0.83  Labs 3/16 K 4.4, creatinine 1.03, LFTs normal, TSH normal, HCT 37.8, BNP 65 Labs 6/16 K 4.1, creatinine 0.93, TSH normal, LFTs normal Labs  11/16 K 5.1, creatinine 1.13, LFTs normal, TSH normal, LDL 83, HDL 33, TGs 162 Labs 1/17 K 4.4, creatinine 0.86, pro-BNP 154 Labs 2/17 BNP 113, K 3.8, creatinine 0.79, TSH normal, LFTs normal Labs 4/17 K 3.8, creatinine 0.91, HCT 48.1 Labs 6/17 K 3.5, creatinine 0.98 Labs 9/17 K 3.9, creatinine 0.79, LDL 69, HDL 29, TSH normal, LFTs normal Labs 3/18 K 3.6, creatinine 0.87 Labs 8/18 K 3.3 => 4.2, creatinine 0.78 => 1.04, LDL 66, HDL 38, BNP 166 Labs 9/18: K 4.7, creatinine 0.9, BNP 144 Labs  11/18: K 4.8, creatinine 1.05, TSH normal, LFTs normal.  Labs 1/19: K 4.4, creatinine 1.08, LFTs normal Labs 5/19: LDL 60, HDL 28, TSH normal, LFTs normal, K 4.6, creatinine 1.43  ECG (personally reviewed): NSR with BiV pacing  ROS: All systems reviewed and negative except as per HPI.   Past Medical History:  Diagnosis Date  . AAA (abdominal aortic aneurysm) ([place])    followed by Dr. Bridgett Larsson  . Adenomatous colon polyp 01/2004  . Anemia    hx  . Automatic implantable cardioverter-defibrillator in situ   . AVM (arteriovenous malformation)   . BPH (benign prostatic hypertrophy)   . CAD (coronary artery disease)    s/p CABGx2 in 1996  . Carotid artery stenosis    LCEA - Dr. Bridgett Larsson in 2013  . CHF (congestive heart failure) ([place])   . Complication of anesthesia    claustrophobic, unabe to lie on back more than 4 hours at time due to back  . COPD (chronic obstructive pulmonary disease) ([place])   . Diabetes mellitus    DIET CONTROLLED- pt states that this was a misdiagnosis, he was treated while in the hospital  but returned home, loss a massive amount of weight and he has not had a problem with his blood sugar since. States everything has been normal for about 3 years.  . Diverticulosis   . Dyspnea   . Dysrhythmia    ICD-defibrillator  . Fatigue   . GERD (gastroesophageal reflux disease)   . H/O hiatal hernia   . History of radiation therapy 04/09/17-04/17/17   SBRT right lung 54 Gy in 3 fractions  . HLD (hyperlipidemia)   . Hypertension   . Hypothyroidism   . Ischemic cardiomyopathy   . Myocardial infarction ([place])   . NSCL ca   . OSA on CPAP    AHI durign total sleep 14.69/hr, during REM 50.91/hr  . Peripheral vascular disease ([facility])    LCEA, L renal artery stent, bilat iliac stents, R SFA stenosis  . Tremor   . Ventricular tachycardia ([place]) 09/09/2014   Amiodarone was started after appropriate defibrillator shocks for ventricular tachycardia in October 2008    Current Outpatient  Medications  Medication Sig Dispense Refill  . albuterol (PROVENTIL HFA;VENTOLIN HFA) 108 (90 BASE) MCG/ACT inhaler Inhale 2 puffs into the lungs every 6 (six) hours as needed for wheezing or shortness of breath. 3 Inhaler 4  . albuterol (PROVENTIL) (2.5 MG/3ML) 0.083% nebulizer solution Take 3 mLs (2.5 mg total) by nebulization every 6 (six) hours. 120 mL 3  . amiodarone (PACERONE) 200 MG tablet Take 100 mg by mouth daily.    Marland Kitchen aspirin 81 MG tablet Take 81 mg by mouth daily.    Marland Kitchen atorvastatin (LIPITOR) 40 MG tablet TAKE 1 TABLET BY MOUTH  DAILY 90 tablet 3  . carvedilol (COREG) 25 MG tablet TAKE 1 TABLET BY MOUTH  TWICE A DAY WITH MEALS 180 tablet 3  . clopidogrel (  PLAVIX) 75 MG tablet TAKE 1 TABLET BY MOUTH  DAILY 90 tablet 3  . diazepam (VALIUM) 5 MG tablet 1-2 tabs every 8 hours if needed for tremor 30 tablet 5  . finasteride (PROSCAR) 5 MG tablet Take 5 mg by mouth daily.    . fluticasone (FLONASE) 50 MCG/ACT nasal spray Instill 1 spray in each  nostril daily 32 g 3  . Fluticasone-Umeclidin-Vilant (TRELEGY ELLIPTA) 100-62.5-25 MCG/INH AEPB Inhale 1 puff into the lungs daily. 30 each 5  . isosorbide mononitrate (IMDUR) 60 MG 24 hr tablet TAKE 1 TABLET BY MOUTH  DAILY 90 tablet 2  . levothyroxine (SYNTHROID, LEVOTHROID) 50 MCG tablet TAKE 1 TABLET BY MOUTH  DAILY BEFORE BREAKFAST 90 tablet 1  . nitroGLYCERIN (NITROSTAT) 0.4 MG SL tablet Place 1 tablet (0.4 mg total) under the tongue every 5 (five) minutes as needed for chest pain. 25 tablet 0  . NON FORMULARY at bedtime. CPAP And during the day as needed    . potassium chloride SA (K-DUR,KLOR-CON) 20 MEQ tablet Take 1 tablet (20 mEq total) by mouth daily. 90 tablet 3  . sacubitril-valsartan (ENTRESTO) 49-51 MG Take 1 tablet by mouth 2 (two) times daily. 60 tablet 3  . spironolactone (ALDACTONE) 25 MG tablet TAKE 1 TABLET BY MOUTH  DAILY 90 tablet 3  . torsemide (DEMADEX) 20 MG tablet Take 80mg  every morning. Take 40mg  every other evening. 540  tablet 3   No current facility-administered medications for this encounter.     Allergies  Allergen Reactions  . Tussionex Pennkinetic Er [Hydrocod Polst-Cpm Polst Er] Other (See Comments)    UNSPECIFIED REACTION  > "caused prostate problems"  . Ace Inhibitors Cough    Social History   Socioeconomic History  . Marital status: Married    Spouse name: Not on file  . Number of children: 2  . Years of education: Not on file  . Highest education level: Not on file  Occupational History  . Occupation: retired Solicitor   Social Needs  . Financial resource strain: Not on file  . Food insecurity:    Worry: Not on file    Inability: Not on file  . Transportation needs:    Medical: Not on file    Non-medical: Not on file  Tobacco Use  . Smoking status: Former Smoker    Packs/day: 1.50    Years: 40.00    Pack years: 60.00    Types: Cigarettes, Pipe    Last attempt to quit: 12/11/2002    Years since quitting: 15.5  . Smokeless tobacco: Never Used  Substance and Sexual Activity  . Alcohol use: No    Alcohol/week: 0.0 oz  . Drug use: No  . Sexual activity: Not Currently  Lifestyle  . Physical activity:    Days per week: Not on file    Minutes per session: Not on file  . Stress: Not on file  Relationships  . Social connections:    Talks on phone: Not on file    Gets together: Not on file    Attends religious service: Not on file    Active member of club or organization: Not on file    Attends meetings of clubs or organizations: Not on file    Relationship status: Not on file  . Intimate partner violence:    Fear of current or ex partner: Not on file    Emotionally abused: Not on file    Physically abused: Not on file  Forced sexual activity: Not on file  Other Topics Concern  . Not on file  Social History Narrative  . Not on file    Family History  Problem Relation Age of Onset  . Lung cancer Mother   . Cancer Mother   . Diabetes Mother   .  Hypertension Mother   . Hyperlipidemia Mother   . Heart disease Mother        before age 55  . Heart attack Father   . Heart disease Father        before age 73  . Hypertension Father   . Colon cancer Sister   . Cancer Sister   . Hypertension Sister   . Hyperlipidemia Sister   . Parkinson's disease Sister   . Diabetes Sister   . Heart disease Sister        before age 69  . Heart attack Maternal Grandmother   . Diabetes Daughter    Blood pressure (!) 120/58, pulse 77, weight 272 lb (123.4 kg), SpO2 94 %.   Wt Readings from Last 3 Encounters:  06/19/18 272 lb (123.4 kg)  06/10/18 267 lb 6.4 oz (121.3 kg)  05/29/18 277 lb (125.6 kg)    General: NAD Neck: No JVD, no thyromegaly or thyroid nodule.  Lungs: Clear to auscultation bilaterally with normal respiratory effort. CV: Nondisplaced PMI.  Heart regular S1/S2, no S3/S4, no murmur.  No peripheral edema.  No carotid bruit.  Normal pedal pulses.  Abdomen: Soft, nontender, no hepatosplenomegaly, no distention.  Skin: Intact without lesions or rashes.  Neurologic: Alert and oriented x 3.  Psych: Normal affect. Extremities: No clubbing or cyanosis.  HEENT: Normal.   ASSESSMENT/PLAN:  1. Chronic systolic CHF: Ischemic cardiomyopathy. Last echo in 8/18 showed EF 30% with severe LV dilation and normal RV.  [employer] CRT-D.  RHC in 1/19 showed normal filling pressures and preserved cardiac output.  Stable NYHA class II-III symptoms.  He is not volume overloaded on exam.  I think that his current dyspnea is related more to COPD than CHF. BP stable on lower Entresto. - Continue torsemide 80 qam/40 every other pm.  BMET today.  - Continue carvedilol 25 mg twice a day  - Continue Entresto to 49/51 bid.     - Continue spironolactone 25 mg daily.  2. CAD s/p CABG: No chest pain. [place] 1/19 with stable anatomy (LAD is patent, other vessels occluded with collaterals from the LAD). - Continue 81 mg aspirin daily and atorvastatin, good  lipids 8/18. -OK to hold Plavix for neurosurgery procedure.  He would be a good candidate to replace Plavix with Xarelto 2.5 mg bid given his PAD.  Can do this after his procedure.  3. VT s/p ICD: On amiodarone for history of ICD discharges due to VT.   - Check LFTs and TSH. Knows he needs yearly eye exams.   - Given increased tremor, I have cut back amiodarone to 100 mg daily. However, given multiple ICD discharges in the past, I do not want to stop it totally.  4. Morbid obesity: Still needs to work on diet/weight loss.  5. COPD: Moderate to severe.  I think that a lot of his symptoms are related to COPD.   - Followup with pulmonary. - Now has home oxygen.  6. Lung cancer: Has had radiation.  7. PAD: Occluded left SFA and significant right SFA stenosis on last CTA.  ABIs in 2/19 with significant disease bilaterally, worse on left.  Does not get  pain with exertion but has nocturnal cramps in left leg.  Followed by [facility], saw Dr. Bridgett Larsson with plan for medical management.  He is on statin with optimized lipids.  8. AAA: s/p repair.  Followed at [facility].  No endoleak on last evaluation.  9. Tremor: Most likely essential tremor given family history.  However, cannot rule out role for amiodarone. As above, I think that he needs to continue at least a low dose of amiodarone.  Dr. Carles Collet with neurology discussed deep brain stimulation => this will be placed by Dr. Vertell Limber.  I think he is stable from a cardiovascular standpoint to have this placed, can hold Plavix for procedure.   Followup in 3 months.   [name] 06/20/2018

## 2018-06-24 ENCOUNTER — Telehealth (HOSPITAL_COMMUNITY): Payer: Self-pay

## 2018-06-24 DIAGNOSIS — I5042 Chronic combined systolic (congestive) and diastolic (congestive) heart failure: Secondary | ICD-10-CM

## 2018-06-24 MED ORDER — TORSEMIDE 20 MG PO TABS: 80.0000 mg | ORAL_TABLET | Freq: Every day | ORAL | 3 refills | Status: DC

## 2018-06-24 NOTE — Telephone Encounter (Signed)
Notes recorded by Shirley Muscat, RN on 06/24/2018 at 4:53 PM EDT Pt wife aware of results, Lab appointment made (orders placed)   ------  Notes recorded by Larey Dresser, MD on 06/23/2018 at 11:57 AM EDT Creatinine higher. Stop the pm torsemide (continue am torsemide). BMET 1 week. Call us if weight trends up. ------  Notes recorded by [name], RN on 06/21/2018 at 3:15 PM EDT CR elevated will send to MD for review

## 2018-06-27 ENCOUNTER — Other Ambulatory Visit: Payer: Self-pay | Admitting: Neurosurgery

## 2018-06-27 ENCOUNTER — Other Ambulatory Visit (HOSPITAL_COMMUNITY): Payer: Self-pay | Admitting: Neurosurgery

## 2018-06-27 DIAGNOSIS — G250 Essential tremor: Secondary | ICD-10-CM

## 2018-06-27 DIAGNOSIS — H26491 Other secondary cataract, right eye: Secondary | ICD-10-CM | POA: Diagnosis not present

## 2018-07-02 ENCOUNTER — Ambulatory Visit (HOSPITAL_COMMUNITY)
Admission: RE | Admit: 2018-07-02 | Discharge: 2018-07-02 | Disposition: A | Discharge status: Home / Self Care | Payer: Medicare Other | Source: Ambulatory Visit | Attending: Cardiology | Admitting: Cardiology

## 2018-07-02 DIAGNOSIS — I5042 Chronic combined systolic (congestive) and diastolic (congestive) heart failure: Secondary | ICD-10-CM | POA: Diagnosis not present

## 2018-07-02 LAB — BASIC METABOLIC PANEL
Anion gap: 7 (ref 5–15)
BUN: 31 mg/dL — ABNORMAL HIGH (ref 8–23)
CO2: 26 mmol/L (ref 22–32)
Calcium: 9.1 mg/dL (ref 8.9–10.3)
Chloride: 108 mmol/L (ref 98–111)
Creatinine, Ser: 1.03 mg/dL (ref 0.61–1.24)
GFR calc Af Amer: >60 mL/min (ref >60)
GFR calc non Af Amer: >60 mL/min (ref >60)
Glucose, Bld: 107 mg/dL — ABNORMAL HIGH (ref 70–99)
Potassium: 4 mmol/L (ref 3.5–5.1)
Sodium: 141 mmol/L (ref 135–145)

## 2018-07-05 ENCOUNTER — Ambulatory Visit (HOSPITAL_COMMUNITY)
Admission: RE | Admit: 2018-07-05 | Discharge: 2018-07-05 | Disposition: A | Discharge status: Home / Self Care | Payer: Medicare Other | Source: Ambulatory Visit | Attending: Neurosurgery | Admitting: Neurosurgery

## 2018-07-05 DIAGNOSIS — G250 Essential tremor: Secondary | ICD-10-CM | POA: Diagnosis not present

## 2018-07-05 DIAGNOSIS — I6523 Occlusion and stenosis of bilateral carotid arteries: Secondary | ICD-10-CM | POA: Diagnosis not present

## 2018-07-24 ENCOUNTER — Other Ambulatory Visit (HOSPITAL_COMMUNITY): Payer: Self-pay | Admitting: *Deleted

## 2018-07-24 MED ORDER — SACUBITRIL-VALSARTAN 49-51 MG PO TABS: 1.0000 | ORAL_TABLET | Freq: Two times a day (BID) | ORAL | 3 refills | Status: DC

## 2018-08-20 ENCOUNTER — Ambulatory Visit (INDEPENDENT_AMBULATORY_CARE_PROVIDER_SITE_OTHER): Payer: Medicare Other | Admitting: *Deleted

## 2018-08-20 DIAGNOSIS — I255 Ischemic cardiomyopathy: Secondary | ICD-10-CM | POA: Diagnosis not present

## 2018-08-20 NOTE — Progress Notes (Signed)
Remote ICD transmission.   

## 2018-08-28 ENCOUNTER — Other Ambulatory Visit (HOSPITAL_COMMUNITY): Payer: Self-pay | Admitting: Internal Medicine

## 2018-09-09 ENCOUNTER — Other Ambulatory Visit (INDEPENDENT_AMBULATORY_CARE_PROVIDER_SITE_OTHER): Payer: Medicare Other

## 2018-09-09 ENCOUNTER — Ambulatory Visit (INDEPENDENT_AMBULATORY_CARE_PROVIDER_SITE_OTHER): Payer: Medicare Other | Admitting: Nurse Practitioner

## 2018-09-09 ENCOUNTER — Ambulatory Visit (INDEPENDENT_AMBULATORY_CARE_PROVIDER_SITE_OTHER)
Admission: RE | Admit: 2018-09-09 | Discharge: 2018-09-09 | Disposition: A | Discharge status: Home / Self Care | Payer: Medicare Other | Source: Ambulatory Visit | Attending: Nurse Practitioner | Admitting: Nurse Practitioner

## 2018-09-09 ENCOUNTER — Encounter: Payer: Self-pay | Admitting: Nurse Practitioner

## 2018-09-09 ENCOUNTER — Telehealth: Payer: Self-pay | Admitting: Internal Medicine

## 2018-09-09 ENCOUNTER — Other Ambulatory Visit: Payer: Self-pay | Admitting: Nurse Practitioner

## 2018-09-09 VITALS — BP 122/76 | HR 51 | Temp 97.6°F | Ht 69.0 in | Wt 285.0 lb

## 2018-09-09 DIAGNOSIS — R609 Edema, unspecified: Secondary | ICD-10-CM

## 2018-09-09 DIAGNOSIS — J209 Acute bronchitis, unspecified: Secondary | ICD-10-CM

## 2018-09-09 DIAGNOSIS — R059 Cough, unspecified: Secondary | ICD-10-CM

## 2018-09-09 DIAGNOSIS — I5042 Chronic combined systolic (congestive) and diastolic (congestive) heart failure: Secondary | ICD-10-CM

## 2018-09-09 DIAGNOSIS — R05 Cough: Secondary | ICD-10-CM | POA: Diagnosis not present

## 2018-09-09 DIAGNOSIS — R911 Solitary pulmonary nodule: Secondary | ICD-10-CM

## 2018-09-09 LAB — BRAIN NATRIURETIC PEPTIDE: Pro B Natriuretic peptide (BNP): 85 pg/mL (ref 0.0–100.0)

## 2018-09-09 MED ORDER — DOXYCYCLINE HYCLATE 100 MG PO TABS: 100.0000 mg | ORAL_TABLET | Freq: Two times a day (BID) | ORAL | 0 refills | Status: DC

## 2018-09-09 NOTE — Progress Notes (Signed)
orders

## 2018-09-09 NOTE — Telephone Encounter (Signed)
Rec'd. Call report from [facility] Radiology with Chest xray results:  IMPRESSION: 1. Question of pulmonary mass in the MEDIAL RIGHT lung base. 2. Recommend further evaluation with CT of the chest. 3. No pulmonary edema. 4. These results will be called to the ordering clinician or representative by the Radiologist Assistant, and communication documented in the PACS or zVision Dashboard.  Will send note to office, and notify Flow Coordinator of the above results avail. In Epic, for [employer] to review.

## 2018-09-09 NOTE — Patient Instructions (Signed)
Please head downstairs for lab work/x-rays. If any of your test results are critically abnormal, you will be contacted right away. Otherwise, I will contact you within a week about your test results and follow up recommendations  Please start doxycycline 100mg  twice daily for 10 days.  Please schedule your albuterol 1-2 puffs every 6 hours for the next 2 days.   Acute Bronchitis, Adult Acute bronchitis is when air tubes (bronchi) in the lungs suddenly get swollen. The condition can make it hard to breathe. It can also cause these symptoms:  A cough.  Coughing up clear, yellow, or green mucus.  Wheezing.  Chest congestion.  Shortness of breath.  A fever.  Body aches.  Chills.  A sore throat.  Follow these instructions at home: Medicines  Take over-the-counter and prescription medicines only as told by your doctor.  If you were prescribed an antibiotic medicine, take it as told by your doctor. Do not stop taking the antibiotic even if you start to feel better. General instructions  Rest.  Drink enough fluids to keep your pee (urine) clear or pale yellow.  Avoid smoking and secondhand smoke. If you smoke and you need help quitting, ask your doctor. Quitting will help your lungs heal faster.  Use an inhaler, cool mist vaporizer, or humidifier as told by your doctor.  Keep all follow-up visits as told by your doctor. This is important. How is this prevented? To lower your risk of getting this condition again:  Wash your hands often with soap and water. If you cannot use soap and water, use hand sanitizer.  Avoid contact with people who have cold symptoms.  Try not to touch your hands to your mouth, nose, or eyes.  Make sure to get the flu shot every year.  Contact a doctor if:  Your symptoms do not get better in 2 weeks. Get help right away if:  You cough up blood.  You have chest pain.  You have very bad shortness of breath.  You become  dehydrated.  You faint (pass out) or keep feeling like you are going to pass out.  You keep throwing up (vomiting).  You have a very bad headache.  Your fever or chills gets worse. This information is not intended to replace advice given to you by your health care provider. Make sure you discuss any questions you have with your health care provider. Document Released: 05/15/2008 Document Revised: 07/05/2016 Document Reviewed: 05/17/2016 Elsevier Interactive Patient Education  [employer].

## 2018-09-09 NOTE — Progress Notes (Signed)
Name: [name]   MRN: [id number]    DOB: 1945-12-20   Date:09/09/2018       Progress Note  Subjective  Chief Complaint  Chief Complaint  Patient presents with  . Cough    x 4weeks  . Foot Swelling    x 2-3 days    HPI [name] is here today, accompanied by wife, with c/o acute cough/cold symptoms, ongoing for about 4 weeks now, wife says over past weekend shes noticed he seems more SOB, coughing more so she made an appointment for him today. He says he is coughing up green sputum now and he also noticed some increased swelling in his lower legs yesterday, seems a little better today. He denies syncope, fevers, chills, nasal congestion, sore throat, chest pain, abdominal pain, nausea, vomiting. Has been using daily inhalers, albuterol inhaler BID with some temporary relief, inhaler seems to help for a short time/breaks up sputum, but then cough returns shortly after using his inhaler.  He has a history of bronchitis, CHF, COPD, no recent antibiotics, does not weigh himself regularly at home  Patient Active Problem List   Diagnosis Date Noted  . Atherosclerosis of native arteries of extremity with intermittent claudication ([place]) 02/06/2018  . Tremor 12/22/2017  . Prerenal renal failure 02/20/2017  . Allergy history, penicillin 01/12/2016  . COPD exacerbation ([place]) 10/14/2015  . Carotid artery disease ([place]) 10/08/2015  . Carotid artery occlusion with infarction ([place]) 10/08/2015  . Nodule of right lung 04/12/2015  . AAA (abdominal aortic aneurysm) without rupture ([place]) 04/02/2015  . Iron deficiency anemia 02/23/2015  . Hyperglycemia 02/17/2015  . Morbid obesity ([place]) 05/27/2014  . Chronic combined systolic and diastolic CHF, NYHA class 3 ([place]) 03/02/2014  . Diastolic dysfunction- grade 2 03/02/2014  . CAD -SVG-RCA PCI '99, CFX PCI '99. Cath '05 and '08 - medical Rx 03/02/2014  . AICD (automatic cardioverter/defibrillator) present 02/24/2014  . Aftercare following surgery of  the circulatory system, [place] 02/13/2014  . Cardiomyopathy, ischemic- EF 20-25% 2D 02/25/14 11/24/2013  . History of acute inferior wall MI 11/24/2013  . 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 PAD (peripheral artery disease) ([place]) 11/24/2013  . Carotid stenosis 10/31/2013  . Hyperlipidemia with target LDL less than 70 06/18/2013  . OSA on CPAP 04/18/2012  . Hypothyroidism 04/18/2012  . Abdominal aneurysm without mention of rupture 12/29/2011  . COPD mixed type ([place]) 10/14/2010  . Essential hypertension 11/22/2009  . HYPERTROPHY PROSTATE W/UR OBST & OTH LUTS 11/22/2009  . BACK PAIN 11/08/2009  . GERD 03/23/2009    Social History   Tobacco Use  . Smoking status: Former Smoker    Packs/day: 1.50    Years: 40.00    Pack years: 60.00    Types: Cigarettes, Pipe    Last attempt to quit: 12/11/2002    Years since quitting: 15.7  . Smokeless tobacco: Never Used  Substance Use Topics  . Alcohol use: No    Alcohol/week: 0.0 standard drinks     Current Outpatient Medications:  .  albuterol (PROVENTIL HFA;VENTOLIN HFA) 108 (90 BASE) MCG/ACT inhaler, Inhale 2 puffs into the lungs every 6 (six) hours as needed for wheezing or shortness of breath., Disp: 3 Inhaler, Rfl: 4 .  albuterol (PROVENTIL) (2.5 MG/3ML) 0.083% nebulizer solution, Take 3 mLs (2.5 mg total) by nebulization every 6 (six) hours., Disp: 120 mL, Rfl: 3 .  amiodarone (PACERONE) 200 MG tablet, Take 100 mg by mouth daily., Disp: , Rfl:  .  aspirin  81 MG tablet, Take 81 mg by mouth daily., Disp: , Rfl:  .  atorvastatin (LIPITOR) 40 MG tablet, TAKE 1 TABLET BY MOUTH  DAILY, Disp: 90 tablet, Rfl: 3 .  carvedilol (COREG) 25 MG tablet, TAKE 1 TABLET BY MOUTH  TWICE A DAY WITH MEALS, Disp: 180 tablet, Rfl: 3 .  clopidogrel (PLAVIX) 75 MG tablet, TAKE 1 TABLET BY MOUTH  DAILY, Disp: 90 tablet, Rfl: 3 .  diazepam (VALIUM) 5 MG tablet, 1-2 tabs every 8 hours if needed for tremor, Disp: 30 tablet, Rfl: 5 .  finasteride (PROSCAR) 5 MG tablet, Take 5  mg by mouth daily., Disp: , Rfl:  .  fluticasone (FLONASE) 50 MCG/ACT nasal spray, Instill 1 spray in each  nostril daily, Disp: 32 g, Rfl: 3 .  Fluticasone-Umeclidin-Vilant (TRELEGY ELLIPTA) 100-62.5-25 MCG/INH AEPB, Inhale 1 puff into the lungs daily., Disp: 30 each, Rfl: 5 .  isosorbide mononitrate (IMDUR) 60 MG 24 hr tablet, TAKE 1 TABLET BY MOUTH  DAILY, Disp: 90 tablet, Rfl: 2 .  KLOR-CON M20 20 MEQ tablet, TAKE 1 TABLET BY MOUTH EVERY DAY, Disp: 90 tablet, Rfl: 2 .  levothyroxine (SYNTHROID, LEVOTHROID) 50 MCG tablet, TAKE 1 TABLET BY MOUTH  DAILY BEFORE BREAKFAST, Disp: 90 tablet, Rfl: 1 .  nitroGLYCERIN (NITROSTAT) 0.4 MG SL tablet, Place 1 tablet (0.4 mg total) under the tongue every 5 (five) minutes as needed for chest pain., Disp: 25 tablet, Rfl: 0 .  NON FORMULARY, at bedtime. CPAP And during the day as needed, Disp: , Rfl:  .  sacubitril-valsartan (ENTRESTO) 49-51 MG, Take 1 tablet by mouth 2 (two) times daily., Disp: 60 tablet, Rfl: 3 .  spironolactone (ALDACTONE) 25 MG tablet, TAKE 1 TABLET BY MOUTH  DAILY, Disp: 90 tablet, Rfl: 3 .  torsemide (DEMADEX) 20 MG tablet, Take 4 tablets (80 mg total) by mouth daily., Disp: 180 tablet, Rfl: 3  Allergies  Allergen Reactions  . Tussionex Pennkinetic Er [Hydrocod Polst-Cpm Polst Er] Other (See Comments)    UNSPECIFIED REACTION  > "caused prostate problems"  . Ace Inhibitors Cough    ROS  No other specific complaints in a complete review of systems (except as listed in HPI above).  Objective  Vitals:   09/09/18 1028  BP: 122/76  Pulse: (!) 51  Temp: 97.6 F (36.4 C)  TempSrc: Oral  SpO2: 95%  Weight: 285 lb (129.3 kg)  Height: 5\' 9"  (1.753 m)    Body mass index is 42.09 kg/m.  Nursing Note and Vital Signs reviewed.  Physical Exam   Constitutional: Patient appears well-developed and well-nourished. No distress.  HEENT: head atraumatic, normocephalic, pupils equal and reactive to light, EOM's intact,  neck supple,  oropharynx pink and moist without exudate Cardiovascular: Normal rate, regular rhythm, S1/S2 present.  No murmur or rub heard. Scant BLE edema. Pulmonary/Chest: Effort normal. No respiratory distress or retractions. Mild wheezing throughout. Neurological: he is alert and oriented to person, place, and time. No cranial nerve deficit. Coordination, balance, strength, speech are normal. Ambulatory with cane. Skin: Skin is warm and dry. No rash noted. No erythema.  Psychiatric: Patient has a normal mood and affect. behavior is normal. Judgment and thought content normal.   Assessment & Plan  1. Cough - DG Chest 2 View; Future - B Nat Peptide; Future - doxycycline (VIBRA-TABS) 100 MG tablet; Take 1 tablet (100 mg total) by mouth 2 (two) times daily.  Dispense: 20 tablet; Refill: 0  2. Edema, unspecified type - DG Chest 2  View; Future - B Nat Peptide; Future  3. Acute bronchitis, unspecified organism - DG Chest 2 View; Future - doxycycline (VIBRA-TABS) 100 MG tablet; Take 1 tablet (100 mg total) by mouth 2 (two) times daily.  Dispense: 20 tablet; Refill: 0  4. Chronic combined systolic and diastolic CHF, NYHA class 3 ([facility]) - B Nat Peptide; Future  Will update CXR, BNP, start course of doxycyline, schedule albuterol treatments for acute symptoms/bronchitis-medication dosing and side effects discussed Home management, Red flags and when to present for emergency care or RTC including fever >101.79F, chest pain, shortness of breath, new/worsening/un-resolving symptoms, reviewed with patient at time of visit. Follow up and care instructions discussed and provided in AVS. F/U with further recommendations pending test results

## 2018-09-09 NOTE — Telephone Encounter (Signed)
Follow up sent in result notes

## 2018-09-10 LAB — CUP PACEART REMOTE DEVICE CHECK
Battery Remaining Longevity: 72 mo
Battery Remaining Percentage: 97 %
Brady Statistic RA Percent Paced: 0 %
Brady Statistic RV Percent Paced: 92 %
Date Time Interrogation Session: 20190910075900
HighPow Impedance: 48 Ohm
Implantable Lead Implant Date: 20060411
Implantable Lead Implant Date: 20151030
Implantable Lead Implant Date: 20151030
Implantable Lead Location: 753858
Implantable Lead Location: 753859
Implantable Lead Location: 753860
Implantable Lead Model: 148
Implantable Lead Model: 4136
Implantable Lead Serial Number: 145070
Implantable Lead Serial Number: 29713415
Implantable Pulse Generator Implant Date: 20151030
Lead Channel Impedance Value: 622 Ohm
Lead Channel Impedance Value: 631 Ohm
Lead Channel Impedance Value: 674 Ohm
Lead Channel Pacing Threshold Amplitude: 0.6 V
Lead Channel Pacing Threshold Amplitude: 0.6 V
Lead Channel Pacing Threshold Amplitude: 1.9 V
Lead Channel Pacing Threshold Pulse Width: 0.4 ms
Lead Channel Pacing Threshold Pulse Width: 0.4 ms
Lead Channel Pacing Threshold Pulse Width: 0.9 ms
Lead Channel Setting Pacing Amplitude: 2 V
Lead Channel Setting Pacing Amplitude: 2 V
Lead Channel Setting Pacing Amplitude: 3.5 V
Lead Channel Setting Pacing Pulse Width: 0.4 ms
Lead Channel Setting Pacing Pulse Width: 0.9 ms
Lead Channel Setting Sensing Sensitivity: 0.6 mV
Lead Channel Setting Sensing Sensitivity: 1 mV
Pulse Gen Serial Number: 370874

## 2018-09-16 ENCOUNTER — Telehealth: Payer: Self-pay | Admitting: *Deleted

## 2018-09-16 ENCOUNTER — Ambulatory Visit (HOSPITAL_COMMUNITY)
Admission: RE | Admit: 2018-09-16 | Discharge: 2018-09-16 | Disposition: A | Discharge status: Home / Self Care | Payer: Medicare Other | Source: Ambulatory Visit | Attending: Radiation Oncology | Admitting: Radiation Oncology

## 2018-09-16 ENCOUNTER — Other Ambulatory Visit: Payer: Self-pay

## 2018-09-16 ENCOUNTER — Encounter: Payer: Self-pay | Admitting: Radiation Oncology

## 2018-09-16 ENCOUNTER — Ambulatory Visit
Admission: RE | Admit: 2018-09-16 | Discharge: 2018-09-16 | Disposition: A | Discharge status: Home / Self Care | Payer: Medicare Other | Source: Ambulatory Visit | Attending: Radiation Oncology | Admitting: Radiation Oncology

## 2018-09-16 VITALS — BP 112/48 | HR 71 | Temp 97.6°F | Resp 20 | Ht 68.0 in | Wt 280.0 lb

## 2018-09-16 DIAGNOSIS — Z08 Encounter for follow-up examination after completed treatment for malignant neoplasm: Secondary | ICD-10-CM | POA: Diagnosis not present

## 2018-09-16 DIAGNOSIS — Z8709 Personal history of other diseases of the respiratory system: Secondary | ICD-10-CM | POA: Diagnosis not present

## 2018-09-16 DIAGNOSIS — R918 Other nonspecific abnormal finding of lung field: Secondary | ICD-10-CM | POA: Diagnosis not present

## 2018-09-16 DIAGNOSIS — Z79899 Other long term (current) drug therapy: Secondary | ICD-10-CM | POA: Insufficient documentation

## 2018-09-16 DIAGNOSIS — Z7982 Long term (current) use of aspirin: Secondary | ICD-10-CM | POA: Insufficient documentation

## 2018-09-16 DIAGNOSIS — Z9981 Dependence on supplemental oxygen: Secondary | ICD-10-CM | POA: Diagnosis not present

## 2018-09-16 DIAGNOSIS — I700 Atherosclerosis of aorta: Secondary | ICD-10-CM

## 2018-09-16 DIAGNOSIS — J439 Emphysema, unspecified: Secondary | ICD-10-CM

## 2018-09-16 DIAGNOSIS — Z7902 Long term (current) use of antithrombotics/antiplatelets: Secondary | ICD-10-CM | POA: Diagnosis not present

## 2018-09-16 DIAGNOSIS — J209 Acute bronchitis, unspecified: Secondary | ICD-10-CM | POA: Diagnosis not present

## 2018-09-16 DIAGNOSIS — R911 Solitary pulmonary nodule: Secondary | ICD-10-CM

## 2018-09-16 DIAGNOSIS — R590 Localized enlarged lymph nodes: Secondary | ICD-10-CM | POA: Insufficient documentation

## 2018-09-16 DIAGNOSIS — Z792 Long term (current) use of antibiotics: Secondary | ICD-10-CM | POA: Diagnosis not present

## 2018-09-16 DIAGNOSIS — J449 Chronic obstructive pulmonary disease, unspecified: Secondary | ICD-10-CM | POA: Diagnosis not present

## 2018-09-16 NOTE — Telephone Encounter (Signed)
CALLED PATIENT TO INFORM OF CT FOR 09-16-18- ARRIVAL TIME - 2:15 PM @ [facility], NO RESTRICTIONS TO TEST, TEST TO BEGIN @ 3 PM AND LAST 30 MINS., SPOKE WITH PATIENT'S WIFE - LINDA AND SHE IS AWARE OF THIS TEST.

## 2018-09-16 NOTE — Progress Notes (Signed)
Radiation Oncology         [phone] ________________________________  Name: [name] MRN: [id number]  Date: 09/16/2018  DOB: Apr 24, 1946  Follow-Up Visit Note  CC: [name], MD  [name], *    ICD-10-CM   1. Nodule of right lung R91.1     Diagnosis: Enlarging right lower lobe pulmonary nodule   Interval Since Last Radiation: 7 months  02/04/18 - 02/08/18: Lung, Right Lower/ 54 Gy delivered in 3 fractions (SBRT)  04/09/17 - 04/17/17: Lung, Right Upper/ 54 Gy delivered in 3 fractions (SBRT)   Narrative:  The patient returns today for routine follow-up. He is accompanied by his wife. He notes bronchitis for the last 6 weeks and saw his PCP last week. He is on day 8 out of 10 of an antibiotic regimen. He reports progressive weakness from bronchitis, cough that is occasionally productive with green sputum, and continued issues with breathing (feels like the air isn't getting into his lungs) and wheezing. His wife reports that he ran out of inhaler medication over the weekend, but he notes that he doesn't get much relief from it. He uses oxygen at home as needed.  Follow up chest CT scan performed earlier today revealed:  1. Radiation changes involving the right upper lobe. However, there are 2 areas of progressive density suspicious for recurrent tumor. PET-CT may be helpful for further evaluation. 2. Dense radiation changes involving the right middle lobe and right lower lobe. 3. Left upper lobe pulmonary nodule is less consolidated and now has more the appearance of small cluster of nodules. Recommend continued observation. 4. Slight interval enlargement of a pretracheal lymph node but no other new or progressive findings in the mediastinum or hilum. 5. No findings for upper abdominal metastatic disease.  Patient is scheduled to undergo bilateral deep brain stimulation for essential tremors on 11/22/18.                    ALLERGIES:  is allergic to tussionex  pennkinetic er [hydrocod polst-cpm polst er] and ace inhibitors.  Meds: Current Outpatient Medications  Medication Sig Dispense Refill  . albuterol (PROVENTIL HFA;VENTOLIN HFA) 108 (90 BASE) MCG/ACT inhaler Inhale 2 puffs into the lungs every 6 (six) hours as needed for wheezing or shortness of breath. 3 Inhaler 4  . albuterol (PROVENTIL) (2.5 MG/3ML) 0.083% nebulizer solution Take 3 mLs (2.5 mg total) by nebulization every 6 (six) hours. 120 mL 3  . amiodarone (PACERONE) 200 MG tablet Take 100 mg by mouth daily.    Marland Kitchen aspirin 81 MG tablet Take 81 mg by mouth daily.    Marland Kitchen atorvastatin (LIPITOR) 40 MG tablet TAKE 1 TABLET BY MOUTH  DAILY 90 tablet 3  . carvedilol (COREG) 25 MG tablet TAKE 1 TABLET BY MOUTH  TWICE A DAY WITH MEALS 180 tablet 3  . clopidogrel (PLAVIX) 75 MG tablet TAKE 1 TABLET BY MOUTH  DAILY 90 tablet 3  . diazepam (VALIUM) 5 MG tablet 1-2 tabs every 8 hours if needed for tremor 30 tablet 5  . doxycycline (VIBRA-TABS) 100 MG tablet Take 1 tablet (100 mg total) by mouth 2 (two) times daily. 20 tablet 0  . finasteride (PROSCAR) 5 MG tablet Take 5 mg by mouth daily.    . fluticasone (FLONASE) 50 MCG/ACT nasal spray Instill 1 spray in each  nostril daily 32 g 3  . Fluticasone-Umeclidin-Vilant (TRELEGY ELLIPTA) 100-62.5-25 MCG/INH AEPB Inhale 1 puff into the lungs daily. 30 each 5  .  isosorbide mononitrate (IMDUR) 60 MG 24 hr tablet TAKE 1 TABLET BY MOUTH  DAILY 90 tablet 2  . KLOR-CON M20 20 MEQ tablet TAKE 1 TABLET BY MOUTH EVERY DAY 90 tablet 2  . levothyroxine (SYNTHROID, LEVOTHROID) 50 MCG tablet TAKE 1 TABLET BY MOUTH  DAILY BEFORE BREAKFAST 90 tablet 1  . nitroGLYCERIN (NITROSTAT) 0.4 MG SL tablet Place 1 tablet (0.4 mg total) under the tongue every 5 (five) minutes as needed for chest pain. 25 tablet 0  . NON FORMULARY at bedtime. CPAP And during the day as needed    . sacubitril-valsartan (ENTRESTO) 49-51 MG Take 1 tablet by mouth 2 (two) times daily. 60 tablet 3  .  spironolactone (ALDACTONE) 25 MG tablet TAKE 1 TABLET BY MOUTH  DAILY 90 tablet 3  . torsemide (DEMADEX) 20 MG tablet Take 4 tablets (80 mg total) by mouth daily. 180 tablet 3   No current facility-administered medications for this encounter.     Physical Findings: The patient is in no acute distress. Patient is alert and oriented.  height is 5\' 8"  (1.727 m) and weight is 280 lb (127 kg). His oral temperature is 97.6 F (36.4 C). His blood pressure is 112/48 (abnormal) and his pulse is 71. His respiration is 20 and oxygen saturation is 96%.  Lungs are clear to auscultation bilaterally. Heart has regular rate and rhythm. No palpable cervical, supraclavicular, or axillary adenopathy. Abdomen soft, non-tender, normal bowel sounds.   Lab Findings: Lab Results  Component Value Date   WBC 6.8 12/28/2017   HGB 14.9 12/28/2017   HCT 45.9 12/28/2017   MCV 96.8 12/28/2017   PLT 135 (L) 12/28/2017    Radiographic Findings: Dg Chest 2 View  Result Date: 09/09/2018 CLINICAL DATA:  Cough, congestion, pedal edema and dyspnea x 1 mo; worsening. Hx of HTN, COPD/emphysems, MI, stent. Ex smoker. EXAM: CHEST - 2 VIEW COMPARISON:  12/18/2017 and 03/18/2018 CT FINDINGS: Median sternotomy. The patient has LEFT-sided transvenous pacemaker with leads to the RIGHT atrium and RIGHT ventricle and coronary sinus. Heart size is normal. There is streaky postoperative opacity in the RIGHT UPPER lobe, stable over prior studies. In the MEDIAL RIGHT lung base, there is an irregular opacity raising the question of pulmonary mass or early consolidation. No evidence for pulmonary edema. IMPRESSION: 1. Question of pulmonary mass in the MEDIAL RIGHT lung base. 2. Recommend further evaluation with CT of the chest. 3. No pulmonary edema. 4. These results will be called to the ordering clinician or representative by the Radiologist Assistant, and communication documented in the PACS or zVision Dashboard. Electronically Signed   By:  Nolon Nations M.D.   On: 09/09/2018 14:02   Ct Chest Wo Contrast  Result Date: 09/16/2018 CLINICAL DATA:  Followup pulmonary lesions. History of right upper lobe and right lower lobe radiation therapy. EXAM: CT CHEST WITHOUT CONTRAST TECHNIQUE: Multidetector CT imaging of the chest was performed following the standard protocol without IV contrast. COMPARISON:  CT scans 03/18/2018 and 01/11/2018 FINDINGS: Cardiovascular: The heart is within normal limits in size for age. No pericardial effusion. Stable tortuosity, ectasia and calcification of the thoracic aorta and stable coronary artery calcifications. 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 Mediastinum/Nodes: 8 mm pretracheal lymph node on image number 52 previously measured 6.5 mm. Other small scattered mediastinal and lymph nodes are stable. Lungs/Pleura: Right upper lobe radiation changes are again demonstrated. Increasing soft tissue density centrally could be further consolidation from radiation or recurrent tumor. This area of  density measures approximately 10 x 8 mm and now measures 38 x 19 mm. Second area of spiculated density more inferiorly and medially in the right upper lobe also appears larger. It previously measured 11.5 x 9 mm and now measures 17 x 16 mm on image number 55 of series 5. This is worrisome for recurrent tumor. The left upper lobe nodule now has more the appearance of small cluster of nodules measuring 12 mm. This previously measured 8 mm but had a more solid congruent appearance. Right lower lobe and right middle lobe radiation changes. The pulmonary nodule is no longer identified but there is a dense area of bow tie shaped density measuring 7 x 2.7 cm. No new pulmonary nodules. Stable underlying emphysematous changes. Upper Abdomen: No significant upper abdominal findings. Advanced atherosclerotic calcifications involving the aorta and branch vessels. No hepatic or adrenal gland lesions are identified.  Musculoskeletal: No significant bony findings. IMPRESSION: 1. Radiation changes involving the right upper lobe. However, there are 2 areas of progressive density suspicious for recurrent tumor. PET-CT may be helpful for further evaluation. 2. Dense radiation changes involving the right middle lobe and right lower lobe. 3. Left upper lobe pulmonary nodule is less consolidated and now has more the appearance of small cluster of nodules. Recommend continued observation. 4. Slight interval enlargement of a pretracheal lymph node but no other new or progressive findings in the mediastinum or hilum. 5. No findings for upper abdominal metastatic disease. Aortic Atherosclerosis (ICD10-I70.0) and Emphysema (ICD10-J43.9). Electronically Signed   By: Marijo Sanes M.D.   On: 09/16/2018 15:22    Impression:  Right lower lobe pulmonary nodule  Patient had a recent chest x-ray with questions of density in the right middle lobe. CT scan was recommended, which was performed earlier today. It showed the most recently treated nodule in the right lower lobe to be barely visible. However, in the right upper lobe, patient was noted to the 2 progressive dense areas suspicious for recurrent tumor. PET scan was recommended for further evaluation. Patient has been on antibiotics for his bronchitis/acute exacerbation of his COPD and continues on antibiotics at this time but has not had significant improvement in his breathing thus far. He continues on albuterol nebulizer at home but has run out of his trelogy. I recommend that he refill this tonight and restart this medication. Patient does understand that he should present to the ER if his breathing progressively worsens. He may require admission if he doesn't show improvement.  Plan: Patient will be scheduled for PET scan in the near future for further evaluation of new densities in the right upper lobe. Patient will be called with results from PET scan once available and will  continue follow up with radiation oncology following his SBRT treatments.   -----------------------------------  Blair Promise, PhD, MD  This document serves as a record of services personally performed by Gery Pray, MD. It was created on his behalf by Wilburn Mylar, a trained medical scribe. The creation of this record is based on the scribe's personal observations and the provider's statements to them. This document has been checked and approved by the attending provider.

## 2018-09-16 NOTE — Progress Notes (Signed)
Pt presents today for work in appt with Dr. Sondra Come. Pt and wife state that pt has had bronchitis s/s for 6 weeks. Pt reports progressive weakness over the past 6 weeks.  Cough is occasionally productive, green sputum. Pt is SOB upon interview and audibly wheezing. Pt and wife endorse having PCP visit last week with CXR. PCP wanted pt to come in for CT of chest, at which pt and wife decided to present to Dr. Sondra Come in the event there was new malignancy present on scan.   BP (!) 112/48 (BP Location: Left Arm, Patient Position: Sitting)   Pulse 71   Temp 97.6 F (36.4 C) (Oral)   Resp 20   Ht 5\' 8"  (1.727 m)   Wt 280 lb (127 kg)   SpO2 96%   BMI 42.57 kg/m   Wt Readings from Last 3 Encounters:  09/16/18 280 lb (127 kg)  09/09/18 285 lb (129.3 kg)  06/19/18 272 lb (123.4 kg)   Loma Sousa, RN BSN

## 2018-09-17 ENCOUNTER — Other Ambulatory Visit: Payer: Self-pay | Admitting: Nurse Practitioner

## 2018-09-17 DIAGNOSIS — R05 Cough: Secondary | ICD-10-CM

## 2018-09-17 DIAGNOSIS — R059 Cough, unspecified: Secondary | ICD-10-CM

## 2018-09-17 DIAGNOSIS — J209 Acute bronchitis, unspecified: Secondary | ICD-10-CM

## 2018-09-19 ENCOUNTER — Other Ambulatory Visit (HOSPITAL_COMMUNITY): Payer: Self-pay | Admitting: Neurosurgery

## 2018-09-19 DIAGNOSIS — G250 Essential tremor: Secondary | ICD-10-CM

## 2018-09-23 ENCOUNTER — Ambulatory Visit: Payer: Medicare Other | Admitting: Internal Medicine

## 2018-09-24 ENCOUNTER — Ambulatory Visit: Payer: 59 | Admitting: Neurology

## 2018-09-26 ENCOUNTER — Ambulatory Visit (INDEPENDENT_AMBULATORY_CARE_PROVIDER_SITE_OTHER): Payer: Medicare Other | Admitting: Internal Medicine

## 2018-09-26 ENCOUNTER — Encounter: Payer: Self-pay | Admitting: Internal Medicine

## 2018-09-26 VITALS — BP 124/60 | HR 75 | Temp 97.6°F | Resp 20 | Ht 68.0 in | Wt 284.0 lb

## 2018-09-26 DIAGNOSIS — Z23 Encounter for immunization: Secondary | ICD-10-CM | POA: Diagnosis not present

## 2018-09-26 DIAGNOSIS — J449 Chronic obstructive pulmonary disease, unspecified: Secondary | ICD-10-CM

## 2018-09-26 DIAGNOSIS — R05 Cough: Secondary | ICD-10-CM | POA: Diagnosis not present

## 2018-09-26 DIAGNOSIS — R059 Cough, unspecified: Secondary | ICD-10-CM

## 2018-09-26 DIAGNOSIS — I255 Ischemic cardiomyopathy: Secondary | ICD-10-CM

## 2018-09-26 LAB — POCT EXHALED NITRIC OXIDE: FeNO level (ppb): 49

## 2018-09-26 MED ORDER — BUDESONIDE 0.5 MG/2ML IN SUSP: 0.5000 mg | Freq: Two times a day (BID) | RESPIRATORY_TRACT | 5 refills | Status: DC

## 2018-09-26 MED ORDER — ARFORMOTEROL TARTRATE 15 MCG/2ML IN NEBU: 15.0000 ug | INHALATION_SOLUTION | Freq: Two times a day (BID) | RESPIRATORY_TRACT | 5 refills | Status: DC

## 2018-09-26 MED ORDER — METHYLPREDNISOLONE ACETATE 80 MG/ML IJ SUSP
120.0000 mg | Freq: Once | INTRAMUSCULAR | Status: AC
  Administered 2018-09-26: 120 mg via INTRAMUSCULAR

## 2018-09-26 MED ORDER — GLYCOPYRROLATE 25 MCG/ML IN SOLN: 1.0000 | Freq: Two times a day (BID) | RESPIRATORY_TRACT | 11 refills | Status: DC

## 2018-09-26 MED ORDER — METHYLPREDNISOLONE ACETATE 80 MG/ML IJ SUSP: 80.0000 mg | Freq: Once | INTRAMUSCULAR | Status: DC

## 2018-09-26 NOTE — Patient Instructions (Signed)

## 2018-09-26 NOTE — Progress Notes (Signed)
Subjective:  Patient ID: [name], male    DOB: 02-Oct-1946  Age: 72 y.o. MRN: [id number]  CC: Cough   HPI [name] presents for a 6-week history of cough.  It was initially productive of purulent phlegm but he took a course of doxycycline and now the cough is nonproductive.  He complains of wheezing and shortness of breath.  He is not getting much symptom relief with albuterol and Atrovent inhalers.  He is no longer using the Trelegy.  Outpatient Medications Prior to Visit  Medication Sig Dispense Refill  . albuterol (PROVENTIL HFA;VENTOLIN HFA) 108 (90 BASE) MCG/ACT inhaler Inhale 2 puffs into the lungs every 6 (six) hours as needed for wheezing or shortness of breath. 3 Inhaler 4  . albuterol (PROVENTIL) (2.5 MG/3ML) 0.083% nebulizer solution Take 3 mLs (2.5 mg total) by nebulization every 6 (six) hours. 120 mL 3  . amiodarone (PACERONE) 200 MG tablet Take 100 mg by mouth daily.    Marland Kitchen aspirin 81 MG tablet Take 81 mg by mouth daily.    Marland Kitchen atorvastatin (LIPITOR) 40 MG tablet TAKE 1 TABLET BY MOUTH  DAILY 90 tablet 3  . carvedilol (COREG) 25 MG tablet TAKE 1 TABLET BY MOUTH  TWICE A DAY WITH MEALS 180 tablet 3  . clopidogrel (PLAVIX) 75 MG tablet TAKE 1 TABLET BY MOUTH  DAILY 90 tablet 3  . diazepam (VALIUM) 5 MG tablet 1-2 tabs every 8 hours if needed for tremor 30 tablet 5  . fluticasone (FLONASE) 50 MCG/ACT nasal spray Instill 1 spray in each  nostril daily 32 g 3  . isosorbide mononitrate (IMDUR) 60 MG 24 hr tablet TAKE 1 TABLET BY MOUTH  DAILY 90 tablet 2  . KLOR-CON M20 20 MEQ tablet TAKE 1 TABLET BY MOUTH EVERY DAY 90 tablet 2  . levothyroxine (SYNTHROID, LEVOTHROID) 50 MCG tablet TAKE 1 TABLET BY MOUTH  DAILY BEFORE BREAKFAST 90 tablet 1  . nitroGLYCERIN (NITROSTAT) 0.4 MG SL tablet Place 1 tablet (0.4 mg total) under the tongue every 5 (five) minutes as needed for chest pain. 25 tablet 0  . NON FORMULARY at bedtime. CPAP And during the day as needed    .  sacubitril-valsartan (ENTRESTO) 49-51 MG Take 1 tablet by mouth 2 (two) times daily. 60 tablet 3  . spironolactone (ALDACTONE) 25 MG tablet TAKE 1 TABLET BY MOUTH  DAILY 90 tablet 3  . torsemide (DEMADEX) 20 MG tablet Take 4 tablets (80 mg total) by mouth daily. 180 tablet 3  . ATROVENT HFA 17 MCG/ACT inhaler TAKE 2 PUFFS BY MOUTH EVERY 4 HOURS AS NEEDED FOR WHEEZE  12  . finasteride (PROSCAR) 5 MG tablet Take 5 mg by mouth daily.    . Fluticasone-Umeclidin-Vilant (TRELEGY ELLIPTA) 100-62.5-25 MCG/INH AEPB Inhale 1 puff into the lungs daily. 30 each 5  . doxycycline (VIBRA-TABS) 100 MG tablet Take 1 tablet (100 mg total) by mouth 2 (two) times daily. 20 tablet 0   No facility-administered medications prior to visit.     ROS Review of Systems  Constitutional: Negative for chills, diaphoresis, fatigue and fever.  HENT: Negative.  Negative for sore throat.   Eyes: Negative.   Respiratory: Positive for cough, shortness of breath and wheezing. Negative for chest tightness and stridor.   Cardiovascular: Negative for chest pain, palpitations and leg swelling.  Gastrointestinal: Negative for abdominal pain, diarrhea, nausea and vomiting.  Endocrine: Negative.   Genitourinary: Negative.  Negative for difficulty urinating.  Musculoskeletal: Negative.  Negative  for arthralgias.  Skin: Negative.   Neurological: Negative.  Negative for dizziness, weakness and light-headedness.  Hematological: Negative for adenopathy. Does not bruise/bleed easily.  Psychiatric/Behavioral: Negative.     Objective:  BP 124/60 (BP Location: Left Arm, Patient Position: Sitting, Cuff Size: Large)   Pulse 75   Temp 97.6 F (36.4 C) (Oral)   Resp 20   Ht 5' 8"  (1.727 m)   Wt 284 lb (128.8 kg)   SpO2 95%   BMI 43.18 kg/m   BP Readings from Last 3 Encounters:  09/26/18 124/60  09/16/18 (!) 112/48  09/09/18 122/76    Wt Readings from Last 3 Encounters:  09/26/18 284 lb (128.8 kg)  09/16/18 280 lb (127 kg)    09/09/18 285 lb (129.3 kg)    Physical Exam  Constitutional: He is oriented to person, place, and time. No distress.  HENT:  Mouth/Throat: Oropharynx is clear and moist. No oropharyngeal exudate.  Eyes: Conjunctivae are normal. No scleral icterus.  Neck: Normal range of motion. Neck supple. No JVD present. No thyromegaly present.  Cardiovascular: Normal rate, regular rhythm, S1 normal and normal heart sounds. Exam reveals no gallop.  No murmur heard. Pulmonary/Chest: Effort normal. Tachypnea noted. No respiratory distress. He has decreased breath sounds. He has wheezes in the right upper field, the right middle field, the left upper field and the left middle field. He has rhonchi in the right lower field and the left lower field. He has no rales.  There are bilateral, expiratory wheezes in the middle and upper lobes on both sides.  Abdominal: Soft. Normal appearance and bowel sounds are normal. He exhibits no mass. There is no hepatosplenomegaly. There is no tenderness.  Musculoskeletal: Normal range of motion. He exhibits no edema, tenderness or deformity.  Lymphadenopathy:    He has no cervical adenopathy.  Neurological: He is alert and oriented to person, place, and time.  Skin: Skin is warm and dry. No rash noted. He is not diaphoretic.  Vitals reviewed.   Lab Results  Component Value Date   WBC 6.8 12/28/2017   HGB 14.9 12/28/2017   HCT 45.9 12/28/2017   PLT 135 (L) 12/28/2017   GLUCOSE 107 (H) 07/02/2018   CHOL 113 04/12/2018   TRIG 123 04/12/2018   HDL 28 (L) 04/12/2018   LDLCALC 60 04/12/2018   ALT 24 06/19/2018   AST 18 06/19/2018   NA 141 07/02/2018   K 4.0 07/02/2018   CL 108 07/02/2018   CREATININE 1.03 07/02/2018   BUN 31 (H) 07/02/2018   CO2 26 07/02/2018   TSH 4.092 06/19/2018   PSA 0.51 04/18/2012   INR 1.01 12/28/2017   HGBA1C 5.8 (H) 04/12/2018    Ct Chest Wo Contrast  Result Date: 09/16/2018 CLINICAL DATA:  Followup pulmonary lesions. History of  right upper lobe and right lower lobe radiation therapy. EXAM: CT CHEST WITHOUT CONTRAST TECHNIQUE: Multidetector CT imaging of the chest was performed following the standard protocol without IV contrast. COMPARISON:  CT scans 03/18/2018 and 01/11/2018 FINDINGS: Cardiovascular: The heart is within normal limits in size for age. No pericardial effusion. Stable tortuosity, ectasia and calcification of the thoracic aorta and stable coronary artery calcifications. 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 Mediastinum/Nodes: 8 mm pretracheal lymph node on image number 52 previously measured 6.5 mm. Other small scattered mediastinal and lymph nodes are stable. Lungs/Pleura: Right upper lobe radiation changes are again demonstrated. Increasing soft tissue density centrally could be further consolidation from radiation or recurrent  tumor. This area of density measures approximately 10 x 8 mm and now measures 38 x 19 mm. Second area of spiculated density more inferiorly and medially in the right upper lobe also appears larger. It previously measured 11.5 x 9 mm and now measures 17 x 16 mm on image number 55 of series 5. This is worrisome for recurrent tumor. The left upper lobe nodule now has more the appearance of small cluster of nodules measuring 12 mm. This previously measured 8 mm but had a more solid congruent appearance. Right lower lobe and right middle lobe radiation changes. The pulmonary nodule is no longer identified but there is a dense area of bow tie shaped density measuring 7 x 2.7 cm. No new pulmonary nodules. Stable underlying emphysematous changes. Upper Abdomen: No significant upper abdominal findings. Advanced atherosclerotic calcifications involving the aorta and branch vessels. No hepatic or adrenal gland lesions are identified. Musculoskeletal: No significant bony findings. IMPRESSION: 1. Radiation changes involving the right upper lobe. However, there are 2 areas of progressive  density suspicious for recurrent tumor. PET-CT may be helpful for further evaluation. 2. Dense radiation changes involving the right middle lobe and right lower lobe. 3. Left upper lobe pulmonary nodule is less consolidated and now has more the appearance of small cluster of nodules. Recommend continued observation. 4. Slight interval enlargement of a pretracheal lymph node but no other new or progressive findings in the mediastinum or hilum. 5. No findings for upper abdominal metastatic disease. Aortic Atherosclerosis (ICD10-I70.0) and Emphysema (ICD10-J43.9). Electronically Signed   By: Marijo Sanes M.D.   On: 09/16/2018 15:22    Assessment & Plan:   [name] was seen today for cough.  Diagnoses and all orders for this visit:  COPD mixed type [facility])- He is having a moderately severe exacerbation and he has an elevated FeNO score.  I therefore recommend that he receive a course of systemic steroids.  I also think he should upgrade to nebulized medicine with a LABA, LAMA,  and inhaled corticosteroid. -     Discontinue: arformoterol (BROVANA) 15 MCG/2ML NEBU; Take 2 mLs (15 mcg total) by nebulization 2 (two) times daily. -     Ambulatory referral to [place]: budesonide (PULMICORT) 0.5 MG/2ML nebulizer solution; Take 2 mLs (0.5 mg total) by nebulization 2 (two) times daily. -     Glycopyrrolate (LONHALA MAGNAIR REFILL KIT) 25 MCG/ML SOLN; Inhale 1 Act into the lungs 2 (two) times daily. -     Glycopyrrolate (LONHALA MAGNAIR STARTER KIT) 25 MCG/ML SOLN; Inhale 1 Act into the lungs 2 (two) times daily. -     budesonide (PULMICORT) 0.5 MG/2ML nebulizer solution; Take 2 mLs (0.5 mg total) by nebulization 2 (two) times daily. -     arformoterol (BROVANA) 15 MCG/2ML NEBU; Take 2 mLs (15 mcg total) by nebulization 2 (two) times daily. -     Discontinue: methylPREDNISolone acetate (DEPO-MEDROL) injection 80 mg -     methylPREDNISolone acetate (DEPO-MEDROL) injection 120 mg  Cough -      POCT EXHALED NITRIC OXIDE  Need for influenza vaccination -     Flu vaccine HIGH DOSE PF (Fluzone High dose)   I have discontinued Tyrone L. Lizotte "Yeng Kabat"'s finasteride, Fluticasone-Umeclidin-Vilant, doxycycline, and ATROVENT HFA. I am also having him start on Glycopyrrolate and Glycopyrrolate. Additionally, I am having him maintain his aspirin, NON FORMULARY, albuterol, fluticasone, albuterol, carvedilol, atorvastatin, clopidogrel, diazepam, amiodarone, levothyroxine, nitroGLYCERIN, isosorbide mononitrate, spironolactone, torsemide, sacubitril-valsartan, KLOR-CON M20,  budesonide, and arformoterol. We administered methylPREDNISolone acetate.  Meds ordered this encounter  Medications  . DISCONTD: arformoterol (BROVANA) 15 MCG/2ML NEBU    Sig: Take 2 mLs (15 mcg total) by nebulization 2 (two) times daily.    Dispense:  120 mL    Refill:  5  . DISCONTD: budesonide (PULMICORT) 0.5 MG/2ML nebulizer solution    Sig: Take 2 mLs (0.5 mg total) by nebulization 2 (two) times daily.    Dispense:  120 mL    Refill:  5  . Glycopyrrolate (LONHALA MAGNAIR REFILL KIT) 25 MCG/ML SOLN    Sig: Inhale 1 Act into the lungs 2 (two) times daily.    Dispense:  60 mL    Refill:  11  . Glycopyrrolate (LONHALA MAGNAIR STARTER KIT) 25 MCG/ML SOLN    Sig: Inhale 1 Act into the lungs 2 (two) times daily.    Dispense:  60 mL    Refill:  11  . budesonide (PULMICORT) 0.5 MG/2ML nebulizer solution    Sig: Take 2 mLs (0.5 mg total) by nebulization 2 (two) times daily.    Dispense:  120 mL    Refill:  5  . arformoterol (BROVANA) 15 MCG/2ML NEBU    Sig: Take 2 mLs (15 mcg total) by nebulization 2 (two) times daily.    Dispense:  120 mL    Refill:  5  . DISCONTD: methylPREDNISolone acetate (DEPO-MEDROL) injection 80 mg  . methylPREDNISolone acetate (DEPO-MEDROL) injection 120 mg     Follow-up: Return in about 3 months (around 12/27/2018).  [name], MD

## 2018-09-30 ENCOUNTER — Ambulatory Visit: Payer: Medicare Other | Admitting: Primary Care

## 2018-10-09 ENCOUNTER — Telehealth: Payer: Self-pay | Admitting: Internal Medicine

## 2018-10-09 DIAGNOSIS — I739 Peripheral vascular disease, unspecified: Secondary | ICD-10-CM | POA: Diagnosis not present

## 2018-10-09 DIAGNOSIS — Z87891 Personal history of nicotine dependence: Secondary | ICD-10-CM | POA: Diagnosis not present

## 2018-10-09 DIAGNOSIS — G4733 Obstructive sleep apnea (adult) (pediatric): Secondary | ICD-10-CM | POA: Diagnosis not present

## 2018-10-09 DIAGNOSIS — Z95810 Presence of automatic (implantable) cardiac defibrillator: Secondary | ICD-10-CM | POA: Diagnosis not present

## 2018-10-09 DIAGNOSIS — I2510 Atherosclerotic heart disease of native coronary artery without angina pectoris: Secondary | ICD-10-CM | POA: Diagnosis not present

## 2018-10-09 DIAGNOSIS — E039 Hypothyroidism, unspecified: Secondary | ICD-10-CM | POA: Diagnosis not present

## 2018-10-09 DIAGNOSIS — I700 Atherosclerosis of aorta: Secondary | ICD-10-CM | POA: Diagnosis not present

## 2018-10-09 DIAGNOSIS — D509 Iron deficiency anemia, unspecified: Secondary | ICD-10-CM | POA: Diagnosis not present

## 2018-10-09 DIAGNOSIS — Z7982 Long term (current) use of aspirin: Secondary | ICD-10-CM | POA: Diagnosis not present

## 2018-10-09 DIAGNOSIS — Z7902 Long term (current) use of antithrombotics/antiplatelets: Secondary | ICD-10-CM | POA: Diagnosis not present

## 2018-10-09 DIAGNOSIS — N401 Enlarged prostate with lower urinary tract symptoms: Secondary | ICD-10-CM | POA: Diagnosis not present

## 2018-10-09 DIAGNOSIS — N138 Other obstructive and reflux uropathy: Secondary | ICD-10-CM | POA: Diagnosis not present

## 2018-10-09 DIAGNOSIS — J441 Chronic obstructive pulmonary disease with (acute) exacerbation: Secondary | ICD-10-CM | POA: Diagnosis not present

## 2018-10-09 DIAGNOSIS — I5042 Chronic combined systolic (congestive) and diastolic (congestive) heart failure: Secondary | ICD-10-CM | POA: Diagnosis not present

## 2018-10-09 DIAGNOSIS — Z7951 Long term (current) use of inhaled steroids: Secondary | ICD-10-CM | POA: Diagnosis not present

## 2018-10-09 DIAGNOSIS — I255 Ischemic cardiomyopathy: Secondary | ICD-10-CM | POA: Diagnosis not present

## 2018-10-09 DIAGNOSIS — I714 Abdominal aortic aneurysm, without rupture: Secondary | ICD-10-CM | POA: Diagnosis not present

## 2018-10-09 DIAGNOSIS — Z9181 History of falling: Secondary | ICD-10-CM | POA: Diagnosis not present

## 2018-10-09 DIAGNOSIS — I110 Hypertensive heart disease with heart failure: Secondary | ICD-10-CM | POA: Diagnosis not present

## 2018-10-09 NOTE — Telephone Encounter (Signed)
LVM for pt to return call regarding Medical Necessity form. X1 LVM for Gilda with [employer] at [phone] requesting a medical necessity form to be faxed to our office at fax [phone] attn CY for O2 to be filled out and signed by CY At this time it is not in the folder up front nor CY look at or Katie's cubby. Waiting on form to be sent again by [facility].

## 2018-10-10 ENCOUNTER — Encounter (HOSPITAL_COMMUNITY): Payer: Medicare Other

## 2018-10-11 ENCOUNTER — Other Ambulatory Visit: Payer: Self-pay | Admitting: Internal Medicine

## 2018-10-11 ENCOUNTER — Telehealth: Payer: Self-pay | Admitting: Internal Medicine

## 2018-10-11 ENCOUNTER — Other Ambulatory Visit: Payer: Self-pay | Admitting: Cardiovascular Disease

## 2018-10-11 ENCOUNTER — Encounter: Payer: Self-pay | Admitting: Radiation Oncology

## 2018-10-11 DIAGNOSIS — I2510 Atherosclerotic heart disease of native coronary artery without angina pectoris: Secondary | ICD-10-CM | POA: Diagnosis not present

## 2018-10-11 DIAGNOSIS — I5042 Chronic combined systolic (congestive) and diastolic (congestive) heart failure: Secondary | ICD-10-CM | POA: Diagnosis not present

## 2018-10-11 DIAGNOSIS — I255 Ischemic cardiomyopathy: Secondary | ICD-10-CM | POA: Diagnosis not present

## 2018-10-11 DIAGNOSIS — I739 Peripheral vascular disease, unspecified: Secondary | ICD-10-CM | POA: Diagnosis not present

## 2018-10-11 DIAGNOSIS — J441 Chronic obstructive pulmonary disease with (acute) exacerbation: Secondary | ICD-10-CM | POA: Diagnosis not present

## 2018-10-11 DIAGNOSIS — I110 Hypertensive heart disease with heart failure: Secondary | ICD-10-CM | POA: Diagnosis not present

## 2018-10-11 NOTE — Telephone Encounter (Signed)
Called Cecille Rubin, left detailed message with verbal okay for ST as requested.

## 2018-10-11 NOTE — Telephone Encounter (Signed)
Rx request sent to pharmacy.  

## 2018-10-11 NOTE — Telephone Encounter (Signed)
Copied from [place] [id number]. Topic: Quick Communication - Home Health Verbal Orders >> Oct 11, 2018  8:59 AM Bea Graff, NT wrote: Caller/Agency: [facility] Number: [phone] Requesting OT/PT/Skilled Nursing/Social Work: Speech Therapy Frequency: For evaluation to help with breathing. The nurse will see pt 2 times a week.

## 2018-10-15 ENCOUNTER — Other Ambulatory Visit (INDEPENDENT_AMBULATORY_CARE_PROVIDER_SITE_OTHER): Payer: Medicare Other

## 2018-10-15 ENCOUNTER — Ambulatory Visit (INDEPENDENT_AMBULATORY_CARE_PROVIDER_SITE_OTHER): Payer: Medicare Other | Admitting: Internal Medicine

## 2018-10-15 ENCOUNTER — Encounter: Payer: Self-pay | Admitting: Internal Medicine

## 2018-10-15 VITALS — BP 122/58 | HR 76 | Temp 97.6°F | Resp 16 | Ht 68.0 in | Wt 275.2 lb

## 2018-10-15 DIAGNOSIS — E039 Hypothyroidism, unspecified: Secondary | ICD-10-CM

## 2018-10-15 DIAGNOSIS — D508 Other iron deficiency anemias: Secondary | ICD-10-CM

## 2018-10-15 DIAGNOSIS — I10 Essential (primary) hypertension: Secondary | ICD-10-CM | POA: Diagnosis not present

## 2018-10-15 DIAGNOSIS — I739 Peripheral vascular disease, unspecified: Secondary | ICD-10-CM

## 2018-10-15 DIAGNOSIS — I255 Ischemic cardiomyopathy: Secondary | ICD-10-CM

## 2018-10-15 DIAGNOSIS — M79606 Pain in leg, unspecified: Secondary | ICD-10-CM

## 2018-10-15 DIAGNOSIS — Z23 Encounter for immunization: Secondary | ICD-10-CM | POA: Diagnosis not present

## 2018-10-15 DIAGNOSIS — R7989 Other specified abnormal findings of blood chemistry: Secondary | ICD-10-CM | POA: Diagnosis not present

## 2018-10-15 LAB — MAGNESIUM: Magnesium: 2.3 mg/dL (ref 1.5–2.5)

## 2018-10-15 LAB — COMPREHENSIVE METABOLIC PANEL
ALT: 13 U/L (ref 0–53)
AST: 14 U/L (ref 0–37)
Albumin: 4.5 g/dL (ref 3.5–5.2)
Alkaline Phosphatase: 86 U/L (ref 39–117)
BUN: 32 mg/dL — ABNORMAL HIGH (ref 6–23)
CO2: 28 mEq/L (ref 19–32)
Calcium: 9.8 mg/dL (ref 8.4–10.5)
Chloride: 102 mEq/L (ref 96–112)
Creatinine, Ser: 1.42 mg/dL (ref 0.40–1.50)
GFR: 51.83 mL/min — ABNORMAL LOW (ref >60.00)
Glucose, Bld: 105 mg/dL — ABNORMAL HIGH (ref 70–99)
Potassium: 4.6 mEq/L (ref 3.5–5.1)
Sodium: 140 mEq/L (ref 135–145)
Total Bilirubin: 0.7 mg/dL (ref 0.2–1.2)
Total Protein: 7.3 g/dL (ref 6.0–8.3)

## 2018-10-15 LAB — CBC WITH DIFFERENTIAL/PLATELET
Basophils Absolute: 0.1 10*3/uL (ref 0.0–0.1)
Basophils Relative: 1 % (ref 0.0–3.0)
Eosinophils Absolute: 0.2 10*3/uL (ref 0.0–0.7)
Eosinophils Relative: 2.2 % (ref 0.0–5.0)
HCT: 45.2 % (ref 39.0–52.0)
Hemoglobin: 15.2 g/dL (ref 13.0–17.0)
Lymphocytes Relative: 14.6 % (ref 12.0–46.0)
Lymphs Abs: 1.3 10*3/uL (ref 0.7–4.0)
MCHC: 33.6 g/dL (ref 30.0–36.0)
MCV: 93.2 fl (ref 78.0–100.0)
Monocytes Absolute: 0.9 10*3/uL (ref 0.1–1.0)
Monocytes Relative: 9.4 % (ref 3.0–12.0)
Neutro Abs: 6.6 10*3/uL (ref 1.4–7.7)
Neutrophils Relative %: 72.8 % (ref 43.0–77.0)
Platelets: 199 10*3/uL (ref 150.0–400.0)
RBC: 4.85 Mil/uL (ref 4.22–5.81)
RDW: 14.1 % (ref 11.5–15.5)
WBC: 9.1 10*3/uL (ref 4.0–10.5)

## 2018-10-15 LAB — D-DIMER, QUANTITATIVE: D-Dimer, Quant: 1.29 mcg/mL FEU — ABNORMAL HIGH (ref <0.50)

## 2018-10-15 LAB — TSH: TSH: 3.59 u[IU]/mL (ref 0.35–4.50)

## 2018-10-15 LAB — CK: Total CK: 52 U/L (ref 7–232)

## 2018-10-15 NOTE — Telephone Encounter (Signed)
I have checked CY's look at and this form is not there. I will route message to Triage to be followed up on this AM as [facility] is not open at this time.

## 2018-10-15 NOTE — Patient Instructions (Signed)
Peripheral Vascular Disease Peripheral vascular disease (PVD) is a disease of the blood vessels that are not part of your heart and brain. A simple term for PVD is poor circulation. In most cases, PVD narrows the blood vessels that carry blood from your heart to the rest of your body. This can result in a decreased supply of blood to your arms, legs, and internal organs, like your stomach or kidneys. However, it most often affects a person's lower legs and feet. There are two types of PVD.  Organic PVD. This is the more common type. It is caused by damage to the structure of blood vessels.  Functional PVD. This is caused by conditions that make blood vessels contract and tighten (spasm).  Without treatment, PVD tends to get worse over time. PVD can also lead to acute ischemic limb. This is when an arm or limb suddenly has trouble getting enough blood. This is a medical emergency. What are the causes? Each type of PVD has many different causes. The most common cause of PVD is buildup of a fatty material (plaque) inside of your arteries (atherosclerosis). Small amounts of plaque can break off from the walls of the blood vessels and become lodged in a smaller artery. This blocks blood flow and can cause acute ischemic limb. Other common causes of PVD include:  Blood clots that form inside of blood vessels.  Injuries to blood vessels.  Diseases that cause inflammation of blood vessels or cause blood vessel spasms.  Health behaviors and health history that increase your risk of developing PVD.  What increases the risk? You may have a greater risk of PVD if you:  Have a family history of PVD.  Have certain medical conditions, including: ? High cholesterol. ? Diabetes. ? High blood pressure (hypertension). ? Coronary heart disease. ? Past problems with blood clots. ? Past injury, such as burns or a broken bone. These may have damaged blood vessels in your limbs. ? Buerger disease. This is  caused by inflamed blood vessels in your hands and feet. ? Some forms of arthritis. ? Rare birth defects that affect the arteries in your legs.  Use tobacco.  Do not get enough exercise.  Are obese.  Are age 50 or older.  What are the signs or symptoms? PVD may cause many different symptoms. Your symptoms depend on what part of your body is not getting enough blood. Some common signs and symptoms include:  Cramps in your lower legs. This may be a symptom of poor leg circulation (claudication).  Pain and weakness in your legs while you are physically active that goes away when you rest (intermittent claudication).  Leg pain when at rest.  Leg numbness, tingling, or weakness.  Coldness in a leg or foot, especially when compared with the other leg.  Skin or hair changes. These can include: ? Hair loss. ? Shiny skin. ? Pale or bluish skin. ? Thick toenails.  Inability to get or maintain an erection (erectile dysfunction).  People with PVD are more prone to developing ulcers and sores on their toes, feet, or legs. These may take longer than normal to heal. How is this diagnosed? Your health care provider may diagnose PVD from your signs and symptoms. The health care provider will also do a physical exam. You may have tests to find out what is causing your PVD and determine its severity. Tests may include:  Blood pressure recordings from your arms and legs and measurements of the strength of your pulses (  pulse volume recordings).  Imaging studies using sound waves to take pictures of the blood flow through your blood vessels (Doppler ultrasound).  Injecting a dye into your blood vessels before having imaging studies using: ? X-rays (angiogram or arteriogram). ? Computer-generated X-rays (CT angiogram). ? A powerful electromagnetic field and a computer (magnetic resonance angiogram or MRA).  How is this treated? Treatment for PVD depends on the cause of your condition and the  severity of your symptoms. It also depends on your age. Underlying causes need to be treated and controlled. These include long-lasting (chronic) conditions, such as diabetes, high cholesterol, and high blood pressure. You may need to first try making lifestyle changes and taking medicines. Surgery may be needed if these do not work. Lifestyle changes may include:  Quitting smoking.  Exercising regularly.  Following a low-fat, low-cholesterol diet.  Medicines may include:  Blood thinners to prevent blood clots.  Medicines to improve blood flow.  Medicines to improve your blood cholesterol levels.  Surgical procedures may include:  A procedure that uses an inflated balloon to open a blocked artery and improve blood flow (angioplasty).  A procedure to put in a tube (stent) to keep a blocked artery open (stent implant).  Surgery to reroute blood flow around a blocked artery (peripheral bypass surgery).  Surgery to remove dead tissue from an infected wound on the affected limb.  Amputation. This is surgical removal of the affected limb. This may be necessary in cases of acute ischemic limb that are not improved through medical or surgical treatments.  Follow these instructions at home:  Take medicines only as directed by your health care provider.  Do not use any tobacco products, including cigarettes, chewing tobacco, or electronic cigarettes. If you need help quitting, ask your health care provider.  Lose weight if you are overweight, and maintain a healthy weight as directed by your health care provider.  Eat a diet that is low in fat and cholesterol. If you need help, ask your health care provider.  Exercise regularly. Ask your health care provider to suggest some good activities for you.  Use compression stockings or other mechanical devices as directed by your health care provider.  Take good care of your feet. ? Wear comfortable shoes that fit well. ? Check your feet  often for any cuts or sores. Contact a health care provider if:  You have cramps in your legs while walking.  You have leg pain when you are at rest.  You have coldness in a leg or foot.  Your skin changes.  You have erectile dysfunction.  You have cuts or sores on your feet that are not healing. Get help right away if:  Your arm or leg turns cold and blue.  Your arms or legs become red, warm, swollen, painful, or numb.  You have chest pain or trouble breathing.  You suddenly have weakness in your face, arm, or leg.  You become very confused or lose the ability to speak.  You suddenly have a very bad headache or lose your vision. This information is not intended to replace advice given to you by your health care provider. Make sure you discuss any questions you have with your health care provider. Document Released: 01/04/2005 Document Revised: 05/04/2016 Document Reviewed: 05/07/2014 Elsevier Interactive Patient Education  2017 [employer]  

## 2018-10-15 NOTE — Telephone Encounter (Signed)
Called and spoke with [name] with [employer] at phone [phone] She states that there is NO medical necessity forms that needs completed by our office or the patient for O2 nor for CPAP machine at this time.  Called and spoke with pt's wife to explain that there is no documents here in office for [name] at this time. Advised [name] that I have spoke with [name] with [employer] qualifying no docs needed. [name] advised pt has appt scheduled  With [name] on 12/03/18  This will allow face to face notes, for pt to be able to still get supplies for his O2 and cpap machine. She verbalized understanding, that no documents at this time needed completed for [employer]. Nothing further needed.

## 2018-10-15 NOTE — Progress Notes (Signed)
Subjective:  Patient ID: [name], male    DOB: 02-Oct-1946  Age: 72 y.o. MRN: [id number]  CC: Hypertension; Hypothyroidism; and Anemia   HPI [name] presents for f/up - He complains of a 2-week history of discomfort in both calves.  Symptoms are worse on the left side than the right side.  He has had lower extremity discomfort before but he says these symptoms are different.  There are some cramps while sleeping and there is also some pain with activity during the day.  He has a history of PAD.  He feels like his left calf is bigger than it was right calf.  He has mild lower extremity edema.  Outpatient Medications Prior to Visit  Medication Sig Dispense Refill  . albuterol (PROVENTIL HFA;VENTOLIN HFA) 108 (90 BASE) MCG/ACT inhaler Inhale 2 puffs into the lungs every 6 (six) hours as needed for wheezing or shortness of breath. 3 Inhaler 4  . albuterol (PROVENTIL) (2.5 MG/3ML) 0.083% nebulizer solution Take 3 mLs (2.5 mg total) by nebulization every 6 (six) hours. 120 mL 3  . amiodarone (PACERONE) 200 MG tablet Take 100 mg by mouth daily.    Marland Kitchen arformoterol (BROVANA) 15 MCG/2ML NEBU Take 2 mLs (15 mcg total) by nebulization 2 (two) times daily. 120 mL 5  . aspirin 81 MG tablet Take 81 mg by mouth daily.    Marland Kitchen atorvastatin (LIPITOR) 40 MG tablet TAKE 1 TABLET BY MOUTH  DAILY 90 tablet 3  . budesonide (PULMICORT) 0.5 MG/2ML nebulizer solution Take 2 mLs (0.5 mg total) by nebulization 2 (two) times daily. 120 mL 5  . carvedilol (COREG) 25 MG tablet TAKE 1 TABLET BY MOUTH  TWICE A DAY WITH MEALS 180 tablet 3  . clopidogrel (PLAVIX) 75 MG tablet TAKE 1 TABLET BY MOUTH  DAILY 90 tablet 3  . diazepam (VALIUM) 5 MG tablet 1-2 tabs every 8 hours if needed for tremor 30 tablet 5  . fluticasone (FLONASE) 50 MCG/ACT nasal spray Instill 1 spray in each  nostril daily 32 g 3  . Glycopyrrolate (LONHALA MAGNAIR REFILL KIT) 25 MCG/ML SOLN Inhale 1 Act into the lungs 2 (two) times daily. 60 mL 11    . Glycopyrrolate (LONHALA MAGNAIR STARTER KIT) 25 MCG/ML SOLN Inhale 1 Act into the lungs 2 (two) times daily. 60 mL 11  . isosorbide mononitrate (IMDUR) 60 MG 24 hr tablet TAKE 1 TABLET BY MOUTH  DAILY 90 tablet 2  . KLOR-CON M20 20 MEQ tablet TAKE 1 TABLET BY MOUTH EVERY DAY 90 tablet 2  . levothyroxine (SYNTHROID, LEVOTHROID) 50 MCG tablet TAKE 1 TABLET BY MOUTH  DAILY BEFORE BREAKFAST 90 tablet 0  . nitroGLYCERIN (NITROSTAT) 0.4 MG SL tablet DISSOLVE 1 TABLET UNDER THE TONGUE EVERY 5 MINUTES AS  NEEDED FOR CHEST PAIN , MAX 3 TABLETS IN 15 MIN, CALL  911 IF CHEST PAIN PERSISTS 25 tablet 0  . NON FORMULARY at bedtime. CPAP And during the day as needed    . sacubitril-valsartan (ENTRESTO) 49-51 MG Take 1 tablet by mouth 2 (two) times daily. 60 tablet 3  . spironolactone (ALDACTONE) 25 MG tablet TAKE 1 TABLET BY MOUTH  DAILY 90 tablet 3  . torsemide (DEMADEX) 20 MG tablet Take 4 tablets (80 mg total) by mouth daily. 180 tablet 3   No facility-administered medications prior to visit.     ROS Review of Systems  Constitutional: Negative.   HENT: Negative.   Eyes: Negative.   Respiratory: Positive for  shortness of breath. Negative for cough, chest tightness and wheezing.   Cardiovascular: Positive for leg swelling. Negative for chest pain and palpitations.  Gastrointestinal: Negative for abdominal pain, constipation, diarrhea, nausea and vomiting.  Endocrine: Negative.   Genitourinary: Negative.  Negative for difficulty urinating.  Musculoskeletal: Positive for myalgias. Negative for arthralgias and back pain.  Skin: Negative.  Negative for color change and rash.  Neurological: Negative.  Negative for dizziness, weakness, light-headedness and numbness.  Hematological: Negative for adenopathy. Does not bruise/bleed easily.  Psychiatric/Behavioral: Negative.     Objective:  BP (!) 122/58 (BP Location: Left Arm, Patient Position: Sitting, Cuff Size: Large)   Pulse 76   Temp 97.6 F (36.4  C) (Oral)   Resp 16   Ht 5' 8"  (1.727 m)   Wt 275 lb 4 oz (124.9 kg)   SpO2 97%   BMI 41.85 kg/m   BP Readings from Last 3 Encounters:  10/15/18 (!) 122/58  09/26/18 124/60  09/16/18 (!) 112/48    Wt Readings from Last 3 Encounters:  10/15/18 275 lb 4 oz (124.9 kg)  09/26/18 284 lb (128.8 kg)  09/16/18 280 lb (127 kg)    Physical Exam  Constitutional: He is oriented to person, place, and time. No distress.  HENT:  Mouth/Throat: Oropharynx is clear and moist. No oropharyngeal exudate.  Eyes: Conjunctivae are normal. No scleral icterus.  Neck: Normal range of motion. Neck supple. No JVD present. No thyromegaly present.  Cardiovascular: Normal rate, regular rhythm and intact distal pulses. Exam reveals no gallop.  No murmur heard. Pulses are barely palpable in bilateral DP and PT.  Pulmonary/Chest: Effort normal. No tachypnea. No respiratory distress. He has no wheezes. He has rhonchi in the right middle field and the left middle field. He has no rales.  Abdominal: Soft. Bowel sounds are normal. He exhibits no mass. There is no tenderness.  Musculoskeletal: Normal range of motion. He exhibits edema. He exhibits no tenderness or deformity.  Trace pitting edema in BLE  Lymphadenopathy:    He has no cervical adenopathy.  Neurological: He is alert and oriented to person, place, and time.  Skin: Skin is warm and dry. No rash noted. He is not diaphoretic.    Lab Results  Component Value Date   WBC 9.1 10/15/2018   HGB 15.2 10/15/2018   HCT 45.2 10/15/2018   PLT 199.0 10/15/2018   GLUCOSE 105 (H) 10/15/2018   CHOL 113 04/12/2018   TRIG 123 04/12/2018   HDL 28 (L) 04/12/2018   LDLCALC 60 04/12/2018   ALT 13 10/15/2018   AST 14 10/15/2018   NA 140 10/15/2018   K 4.6 10/15/2018   CL 102 10/15/2018   CREATININE 1.42 10/15/2018   BUN 32 (H) 10/15/2018   CO2 28 10/15/2018   TSH 3.59 10/15/2018   PSA 0.51 04/18/2012   INR 1.01 12/28/2017   HGBA1C 5.8 (H) 04/12/2018     Ct Chest Wo Contrast  Result Date: 09/16/2018 CLINICAL DATA:  Followup pulmonary lesions. History of right upper lobe and right lower lobe radiation therapy. EXAM: CT CHEST WITHOUT CONTRAST TECHNIQUE: Multidetector CT imaging of the chest was performed following the standard protocol without IV contrast. COMPARISON:  CT scans 03/18/2018 and 01/11/2018 FINDINGS: Cardiovascular: The heart is within normal limits in size for age. No pericardial effusion. Stable tortuosity, ectasia and calcification of the thoracic aorta and stable coronary artery calcifications. 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 Mediastinum/Nodes: 8 mm pretracheal lymph node on image number  52 previously measured 6.5 mm. Other small scattered mediastinal and lymph nodes are stable. Lungs/Pleura: Right upper lobe radiation changes are again demonstrated. Increasing soft tissue density centrally could be further consolidation from radiation or recurrent tumor. This area of density measures approximately 10 x 8 mm and now measures 38 x 19 mm. Second area of spiculated density more inferiorly and medially in the right upper lobe also appears larger. It previously measured 11.5 x 9 mm and now measures 17 x 16 mm on image number 55 of series 5. This is worrisome for recurrent tumor. The left upper lobe nodule now has more the appearance of small cluster of nodules measuring 12 mm. This previously measured 8 mm but had a more solid congruent appearance. Right lower lobe and right middle lobe radiation changes. The pulmonary nodule is no longer identified but there is a dense area of bow tie shaped density measuring 7 x 2.7 cm. No new pulmonary nodules. Stable underlying emphysematous changes. Upper Abdomen: No significant upper abdominal findings. Advanced atherosclerotic calcifications involving the aorta and branch vessels. No hepatic or adrenal gland lesions are identified. Musculoskeletal: No significant bony findings.  IMPRESSION: 1. Radiation changes involving the right upper lobe. However, there are 2 areas of progressive density suspicious for recurrent tumor. PET-CT may be helpful for further evaluation. 2. Dense radiation changes involving the right middle lobe and right lower lobe. 3. Left upper lobe pulmonary nodule is less consolidated and now has more the appearance of small cluster of nodules. Recommend continued observation. 4. Slight interval enlargement of a pretracheal lymph node but no other new or progressive findings in the mediastinum or hilum. 5. No findings for upper abdominal metastatic disease. Aortic Atherosclerosis (ICD10-I70.0) and Emphysema (ICD10-J43.9). Electronically Signed   By: Marijo Sanes M.D.   On: 09/16/2018 15:22    Assessment & Plan:   Haile was seen today for hypertension, hypothyroidism and anemia.  Diagnoses and all orders for this visit:  Need for Tdap vaccination -     Tdap vaccine greater than or equal to 7yo IM  Need for pneumococcal vaccination -     Pneumococcal conjugate vaccine 13-valent  Pain of lower extremity, unspecified laterality- He has new onset lower extremity pain with mild edema and an elevated d-dimer.  I have asked him to go undergo a venous ultrasound to screen for deep venous thrombosis. -     D-dimer, quantitative (not at [facility]); Future -     CK; Future -     VAS [place] ABI WITH/WO TBI; Future -     VAS [place] LOWER EXTREMITY VENOUS (DVT); Future  Essential hypertension- His blood pressure is well controlled.  Electrolytes and renal function are normal. -     Comprehensive metabolic panel; Future -     Magnesium; Future  PAD (peripheral artery disease) ([place])- I have asked him to undergo arterial flow studies to see if this explains his lower extremity discomfort. -     VAS [place] ABI WITH/WO TBI; Future  Acquired hypothyroidism- His TSH is in the normal range.  He will stay on the current dose of levothyroxine. -     TSH; Future  Other iron  deficiency anemia- His H&H are normal now. -     CBC with Differential/Platelet; Future  D-dimer, elevated- As above. -     VAS [place] LOWER EXTREMITY VENOUS (DVT); Future   I am having Camari L. Mckeever "Abrian Seidman" maintain his aspirin, NON FORMULARY, albuterol, fluticasone, albuterol, carvedilol, atorvastatin, clopidogrel,  diazepam, amiodarone, isosorbide mononitrate, spironolactone, torsemide, sacubitril-valsartan, KLOR-CON M20, Glycopyrrolate, Glycopyrrolate, budesonide, arformoterol, nitroGLYCERIN, and levothyroxine.  No orders of the defined types were placed in this encounter.    Follow-up: Return in about 1 week (around 10/22/2018).  [name], MD

## 2018-10-16 ENCOUNTER — Encounter: Payer: Self-pay | Admitting: Internal Medicine

## 2018-10-16 DIAGNOSIS — R7989 Other specified abnormal findings of blood chemistry: Secondary | ICD-10-CM | POA: Insufficient documentation

## 2018-10-16 NOTE — Progress Notes (Signed)
Subjective:   [name] was seen in consultation in the movement disorder clinic at the request of [name].  Pts PCP is Tyrone Lima, MD.  The evaluation is for tremor and memory change.  This patient is accompanied in the office by his spouse who supplements the history. The records that were made available to me were reviewed.  Tremor started approximately 10 years ago and involves the UE equally.  Tremor is most noticeable when he writes or eats.  He cannot use a fork any longer.   There is a family hx of tremor in both sisters and his sisters daughter and patients daughter started to have tremor.  [name] asked the patient to decrease his amiodarone at 10/2017 appt to 100 mg daily but he didn't do that so he asked him to do that again at his 12/18/17 appt.  Affected by caffeine:  Unknown - cut out caffeine (1 cup coffee but its decaf) Affected by alcohol:  Cut out EtOH x 20 years ago Affected by stress:  No. Affected by fatigue:  No. Spills soup if on spoon:  Yes.  , uses straw for soup Spills glass of liquid if full:  Yes.   Affects ADL's (tying shoes, brushing teeth, etc):  Yes.   (trouble tying shoes; able to shave with blade)   Current medications that may exacerbate tremor:  Amiodarone (been on this for 10 years); albuterol  Outside reports reviewed: historical medical records, lab reports and office notes.  Memory is another c/o.    Living situation:  Pt lives with their spouse.  The patient does not do the finances in the home - wife has always done it.  The patient does drive but wife does most of driving.   The patient does not cook; wife does that.    ADL's:  The patient is able to perform hisown ADL's. The patient self medicates.  He prepares his own pill box.  The patients bladder and bowel are  under good control.   Behavior:   There have been no behavioral changes over the years.    Identified concerns:  Family and patient are concerned about short term  memory issues and ability to remember appointments/meeting. Pt states that he makes no effort to remember appointments/meetings because his wife will remember them and "I'm going whether I want to or not."  States that he knows that his wife will keep up to date with the meetings/appointments.  He reads 6 hours per day and is currently studying spanish and trying to learn that.  05/29/18 update: Patient is seen today in follow-up for tremor.  Patient is accompanied by his wife who supplements the history.  Patient has expressed interest in DBS therapy.  Because of that, the patient underwent neurocognitive testing on May 20, 2018.  Those records are reviewed.  There was evidence of mild cognitive impairment.  There was no evidence of dementia.  Patient has seen cardiology and pulmonary since our last visit.  Cardiology has reduced his amiodarone as far as they can reduce it.  It was felt that he needed a low dose of amiodarone.  [name] stated that he was stable from a cardiovascular standpoint to have DBS, but would need pulmonary clearance given COPD.  He did see [name] and asked him about this, but I do not see the answer that he received in terms of whether or not he would agree to this type of surgery from a  pulmonary standpoint.  Patient states that [name] did give him clearance.  Patient expresses desire to proceed with possible DBS surgery.  10/18/18 update: Patient seen today in follow-up for tremor and for preop DBS video.  Patient is accompanied by wife who supplements history.  Patient wants to proceed with DBS therapy.  Patient had a CT of the brain, but unfortunately this was done without contrast.  He will need a contrasted examination for imaging of the vessels if dbs was to be done.  I did notice that the patient had a CT of the chest since our last visit.  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   PET was ordered but not yet completed.  Using valium when goes out to eat.  Uses that  about 1 time per week.      Allergies  Allergen Reactions  . Tussionex Pennkinetic Er [Hydrocod Polst-Cpm Polst Er] Other (See Comments)    UNSPECIFIED REACTION  > "caused prostate problems"  . Ace Inhibitors Cough    Outpatient Encounter Medications as of 10/18/2018  Medication Sig  . albuterol (PROVENTIL HFA;VENTOLIN HFA) 108 (90 BASE) MCG/ACT inhaler Inhale 2 puffs into the lungs every 6 (six) hours as needed for wheezing or shortness of breath.  Marland Kitchen albuterol (PROVENTIL) (2.5 MG/3ML) 0.083% nebulizer solution Take 3 mLs (2.5 mg total) by nebulization every 6 (six) hours.  Marland Kitchen amiodarone (PACERONE) 200 MG tablet Take 100 mg by mouth daily.  Marland Kitchen arformoterol (BROVANA) 15 MCG/2ML NEBU Take 2 mLs (15 mcg total) by nebulization 2 (two) times daily.  Marland Kitchen aspirin 81 MG tablet Take 81 mg by mouth daily.  Marland Kitchen atorvastatin (LIPITOR) 40 MG tablet TAKE 1 TABLET BY MOUTH  DAILY  . budesonide (PULMICORT) 0.5 MG/2ML nebulizer solution Take 2 mLs (0.5 mg total) by nebulization 2 (two) times daily.  . carvedilol (COREG) 25 MG tablet TAKE 1 TABLET BY MOUTH  TWICE A DAY WITH MEALS  . clopidogrel (PLAVIX) 75 MG tablet TAKE 1 TABLET BY MOUTH  DAILY  . diazepam (VALIUM) 5 MG tablet 1-2 tabs every 8 hours if needed for tremor  . fluticasone (FLONASE) 50 MCG/ACT nasal spray Instill 1 spray in each  nostril daily  . Glycopyrrolate (LONHALA MAGNAIR REFILL KIT) 25 MCG/ML SOLN Inhale 1 Act into the lungs 2 (two) times daily.  . isosorbide mononitrate (IMDUR) 60 MG 24 hr tablet TAKE 1 TABLET BY MOUTH  DAILY  . KLOR-CON M20 20 MEQ tablet TAKE 1 TABLET BY MOUTH EVERY DAY  . levothyroxine (SYNTHROID, LEVOTHROID) 50 MCG tablet TAKE 1 TABLET BY MOUTH  DAILY BEFORE BREAKFAST  . nitroGLYCERIN (NITROSTAT) 0.4 MG SL tablet DISSOLVE 1 TABLET UNDER THE TONGUE EVERY 5 MINUTES AS  NEEDED FOR CHEST PAIN , MAX 3 TABLETS IN 15 MIN, CALL  911 IF CHEST PAIN PERSISTS  . NON FORMULARY at bedtime. CPAP And during the day as needed  .  sacubitril-valsartan (ENTRESTO) 49-51 MG Take 1 tablet by mouth 2 (two) times daily.  Marland Kitchen spironolactone (ALDACTONE) 25 MG tablet TAKE 1 TABLET BY MOUTH  DAILY  . torsemide (DEMADEX) 20 MG tablet Take 4 tablets (80 mg total) by mouth daily.  . [DISCONTINUED] Glycopyrrolate (LONHALA MAGNAIR STARTER KIT) 25 MCG/ML SOLN Inhale 1 Act into the lungs 2 (two) times daily.   No facility-administered encounter medications on file as of 10/18/2018.     Past Medical History:  Diagnosis Date  . AAA (abdominal aortic aneurysm) ([place])    followed by Dr. Bridgett Larsson  .  Adenomatous colon polyp 01/2004  . Anemia    hx  . Automatic implantable cardioverter-defibrillator in situ   . AVM (arteriovenous malformation)   . BPH (benign prostatic hypertrophy)   . CAD (coronary artery disease)    s/p CABGx2 in 1996  . Carotid artery stenosis    LCEA - Dr. Bridgett Larsson in 2013  . CHF (congestive heart failure) ([place])   . Complication of anesthesia    claustrophobic, unabe to lie on back more than 4 hours at time due to back  . COPD (chronic obstructive pulmonary disease) ([place])   . Diabetes mellitus    DIET CONTROLLED- pt states that this was a misdiagnosis, he was treated while in the hospital  but returned home, loss a massive amount of weight and he has not had a problem with his blood sugar since. States everything has been normal for about 3 years.  . Diverticulosis   . Dyspnea   . Dysrhythmia    ICD-defibrillator  . Fatigue   . GERD (gastroesophageal reflux disease)   . H/O hiatal hernia   . History of radiation therapy 04/09/17-04/17/17   SBRT right lung 54 Gy in 3 fractions  . HLD (hyperlipidemia)   . Hypertension   . Hypothyroidism   . Ischemic cardiomyopathy   . Myocardial infarction ([place])   . NSCL ca   . OSA on CPAP    AHI durign total sleep 14.69/hr, during REM 50.91/hr  . Peripheral vascular disease ([facility])    LCEA, L renal artery stent, bilat iliac stents, R SFA stenosis  . Tremor   . Ventricular tachycardia  ([place]) 09/09/2014   Amiodarone was started after appropriate defibrillator shocks for ventricular tachycardia in October 2008    Past Surgical History:  Procedure Laterality Date  . ABDOMINAL AORTIC ENDOVASCULAR STENT GRAFT [name] 01/06/2014   Procedure: ABDOMINAL AORTIC ENDOVASCULAR STENT GRAFT- GORE; ULTRASOUND GUIDED;  Surgeon: Conrad Mira Monte, MD;  Location: [place];  Service: Vascular;  Laterality: [name];  . ANGIOPLASTY     BILATERAL  LE  W/STENTS  . BI-VENTRICULAR IMPLANTABLE CARDIOVERTER DEFIBRILLATOR UPGRADE [name] 10/09/2014   Procedure: BI-VENTRICULAR IMPLANTABLE CARDIOVERTER DEFIBRILLATOR UPGRADE;  Surgeon: Evans Lance, MD;  Location[facility] CATH LAB;  Service: Cardiovascular;  Laterality: [name];  . BIV ICD GENERTAOR CHANGE OUT  10/09/2014   UPGRADE TO BIV        BY DR Lovena Le  . 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 Adora Fridge)  . CARDIAC CATHETERIZATION  10/03/2002   SVG sequentially to OM1 & OM2 totally occluded at ostium, SVG to PDA totally occluded within previously placed prox vein graft stent, smal distal AAA, bialt iliac stents with 30% left end-stent restenosis and 50% right end-stent restnosis(Dr. Gerrie Nordmann)  . CARDIAC CATHETERIZATION  06/11/1998   L main with 20% narrowing in distal 1/3; LAD with 1st diagonla having 70% ostial narrowing, 2nd diagonal with 40% narrowing in prox third, Small & RIMA widely patent; in-stent restenosis of RCIA with successful PTA and new prox SVTRCA stent for residual disease (Dr. Marella Chimes)  . CARDIAC DEFIBRILLATOR PLACEMENT  06/2005   Guidant Vitality HE - ischemic cardiomyopathy - Dr. Marella Chimes  . CAROTID ENDARTERECTOMY Left 01/04/12  . CORONARY ANGIOPLASTY  01/08/2004   cutting balloon atherectomy & percutaneous intervention of RCIA in-stent restenosis (Dr. Marella Chimes)  .  CORONARY ARTERY BYPASS GRAFT  11/04/1985   x2 - PDA and sequential DX-OM (Dr. Redmond Pulling)  . ENDARTERECTOMY  01/04/2012   Procedure: ENDARTERECTOMY CAROTID;left  Surgeon: Hinda Lenis, MD;  Location: [place];  Service: Vascular;  Laterality: Left;  with patch angioplasty  . FUDUCIAL PLACEMENT [name] 02/23/2017   Procedure: PLACEMENT OF FUDUCIAL;  Surgeon: [name], MD;  Location: [place];  Service: Thoracic;  Laterality: [name];  . ICD GENERATOR CHANGE  05/02/2010   [employer]  . ILIAC ARTERY STENT Bilateral 03/1997   and L SFA PTA  . Iron infusion  June 16, 2012  . LEFT HEART CATHETERIZATION WITH CORONARY ANGIOGRAM [name] 07/06/2014   Procedure: LEFT HEART CATHETERIZATION WITH CORONARY ANGIOGRAM;  Surgeon: Peter M Martinique, MD;  Location: [facility] CATH LAB;  Service: Cardiovascular;  Laterality: [name];  . NM MYOCAR PERF WALL MOTION  09/2012   lexiscan myoview - mod-severe perfusion defect r/t infarct or scar w/mild periinfarct ishcemia in basal inferior, mid inferior, apical inferior, basal inferolateral & mid inferoalteral regions - EF 21% low risk scan  . POLYPECTOMY    . RENAL ARTERY STENT Left 03/24/2004   6x10m Genesis stent (Dr. RMarella Chimes  . RIGHT/LEFT HEART CATH AND CORONARY ANGIOGRAPHY [name] 12/28/2017   Procedure: RIGHT/LEFT HEART CATH AND CORONARY ANGIOGRAPHY;  Surgeon: MLarey Dresser MD;  [facility];  Service: Cardiovascular;  Laterality: [name];  . TRANSTHORACIC ECHOCARDIOGRAM  12/2012   EF 30-35; LV mod to severely dilated, mod concentric hypertrophy, severe hypokinesis of inferolateral myocardium, moderate hypokineis of anteroseptal region, grade 1 diastolic dysfunction; mod MR; LA mod-severely dialted; RV mod dialted; RA mildly dilated; PA peak pressure 362mg  . VIDEO BRONCHOSCOPY WITH ENDOBRONCHIAL NAVIGATION [name] 02/23/2017   Procedure: VIDEO BRONCHOSCOPY WITH ENDOBRONCHIAL NAVIGATION;  Surgeon: StMelrose NakayamaMD;  [place]  Service: Thoracic;  Laterality: [name];    Social History   Socioeconomic History  . Marital status: Married    Spouse name: Not on file  . Number of children: 2  . Years of education: Not on file  . Highest education level: Not on file  Occupational History  . Occupation: retired poSolicitor Social Needs  . Financial resource strain: Not on file  . Food insecurity:    Worry: Not on file    Inability: Not on file  . Transportation needs:    Medical: Not on file    Non-medical: Not on file  Tobacco Use  . Smoking status: Former Smoker    Packs/day: 1.50    Years: 40.00    Pack years: 60.00    Types: Cigarettes, Pipe    Last attempt to quit: 12/11/2002    Years since quitting: 15.8  . Smokeless tobacco: Never Used  Substance and Sexual Activity  . Alcohol use: No    Alcohol/week: 0.0 standard drinks  . Drug use: No  . Sexual activity: Not Currently  Lifestyle  . Physical activity:    Days per week: Not on file    Minutes per session: Not on file  . Stress: Not on file  Relationships  . Social connections:    Talks on phone: Not on file    Gets together: Not on file    Attends religious service: Not on file    Active member of club or organization: Not on file    Attends meetings of clubs or organizations: Not on file    Relationship status: Not on  file  . Intimate partner violence:    Fear of current or ex partner: Not on file    Emotionally abused: Not on file    Physically abused: Not on file    Forced sexual activity: Not on file  Other Topics Concern  . Not on file  Social History Narrative  . Not on file    Family Status  Relation Name Status  . Mother  Deceased  . Father  Deceased  . Sister  Deceased  . Sister  Deceased  . MGM  Deceased  . Daughter  Alive  . MGF  Deceased  . PGM  Deceased  . PGF  Deceased  . Son  Alive    Review of Systems Review of Systems  Constitutional: Negative.   HENT: Negative.   Eyes: Negative.     Cardiovascular: Negative.   Gastrointestinal: Negative.   Genitourinary: Negative.   Skin: Negative.   Endo/Heme/Allergies: Negative.      Objective:   VITALS:   Vitals:   10/18/18 0817  BP: 114/68  Pulse: 72  SpO2: 95%  Weight: 268 lb (121.6 kg)  Height: _0  (1.727 m)   Gen:  Appears stated age and in NAD. HEENT:  Normocephalic, atraumatic. The mucous membranes are moist. The superficial temporal arteries are without ropiness or tenderness. Cardiovascular: Regular rate and rhythm. Lungs: Clear to auscultation bilaterally. Neck: There are no carotid bruits noted bilaterally.  NEUROLOGICAL:  Orientation: some of MoCA affected by tremor Montreal Cognitive Assessment  01/07/2018  Visuospatial/ Executive (0/5) 0  Naming (0/3) 3  Attention: Read list of digits (0/2) 1  Attention: Read list of letters (0/1) 1  Attention: Serial 7 subtraction starting at 100 (0/3) 2  Language: Repeat phrase (0/2) 2  Language : Fluency (0/1) 1  Abstraction (0/2) 2  Delayed Recall (0/5) 3  Orientation (0/6) 6  Total 21  Adjusted Score (based on education) 21    GEN:  The patient appears stated age and is in NAD. HEENT:  Normocephalic, atraumatic.  The mucous membranes are moist. The superficial temporal arteries are without ropiness or tenderness. CV:  RRR Lungs:  CTAB Neck/HEME:  There are no carotid bruits bilaterally.  Movement examination: Tone: There is normal tone in the upper and lower extremities.   Abnormal movements: There is moderate tremor in the left hand and mild to moderate on the right.  Archimedes spirals are actually drawn markedly better today with the right hand than they were in the past. Coordination:  There is no decremation with RAM's, with any form of RAMS, including alternating supination and pronation of the forearm, hand opening and closing, finger taps, heel taps and toe taps. Gait and Station: The patient pushes off of the chair to arise.  He ambulates with  a cane.  Gait is somewhat antalgic.   Lab Results  Component Value Date   VITAMINB12 304 01/07/2018   Lab Results  Component Value Date   TSH 3.59 10/15/2018     Chemistry      Component Value Date/Time   NA 140 10/15/2018 1636   NA 145 02/16/2015 1427   K 4.6 10/15/2018 1636   K 4.2 02/16/2015 1427   CL 102 10/15/2018 1636   CL 94 (L) 10/02/2012 0937   CO2 28 10/15/2018 1636   CO2 27 02/16/2015 1427   BUN 32 (H) 10/15/2018 1636   BUN 18.5 02/16/2015 1427   CREATININE 1.42 10/15/2018 1636   CREATININE 1.0 02/16/2015 1427  Component Value Date/Time   CALCIUM 9.8 10/15/2018 1636   CALCIUM 9.5 02/16/2015 1427   ALKPHOS 86 10/15/2018 1636   ALKPHOS 108 02/16/2015 1427   AST 14 10/15/2018 1636   AST 17 02/16/2015 1427   ALT 13 10/15/2018 1636   ALT 17 02/16/2015 1427   BILITOT 0.7 10/15/2018 1636   BILITOT 0.42 02/16/2015 1427          Assessment/Plan:   1.  Tremor  -I did tell the patient that while he likely has essential tremor, because he is also on amiodarone it would be difficult to rule out that as the cause of tremor, or at least a significant source of exacerbation of tremor.  His wife indicates that tremor started around the time as amiodarone was started.   I told the patient that about 1/3 of patients who are on amiodarone will have some degree of tremor.  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   Cardiology records indicate that amiodarone is at the lowest dose possible at this point in time.  Cardiology did clear him for DBS surgery.  He would need clearance from pulmonary given COPD, although patient states that he actually got this clearance already.  -we had previously planned for unilateral left VIM surgery (even though that the left hand is more affected than the right, he is right-handed).  We discussed various makers of the device and ultimately determined that he would be  best fit with a Product/process development scientist.  However, since that time, he has had had recent CT chest that was suspicious for possible recurrent tumor. Awaiting a PET scan to be scheduled.  I talked to Dr. Vertell Limber and Dr. Sondra Come and both agree that surgery needs to be cx for now.   Will need to make sure disease free before proceed with DBS.  Once he is cleared from that standpoint, we can schedule him for DBS.  Pt and wife were agreeable.  -asks about valium.  He is using that about 1 time a week when goes out to eat.  I don't have much objection but not sure that it will help a lot.  He is not to drive when using it.    2.  He is to call me after other medical problems are taken care of.  Much greater than 50% of this visit was spent in counseling and coordinating care.  Total face to face time:  25 min  CC:  Tyrone Lima, MD

## 2018-10-17 ENCOUNTER — Ambulatory Visit (HOSPITAL_COMMUNITY)
Admission: RE | Admit: 2018-10-17 | Discharge: 2018-10-17 | Disposition: A | Discharge status: Home / Self Care | Payer: Medicare Other | Source: Ambulatory Visit | Attending: Internal Medicine | Admitting: Internal Medicine

## 2018-10-17 DIAGNOSIS — M79606 Pain in leg, unspecified: Secondary | ICD-10-CM | POA: Insufficient documentation

## 2018-10-17 DIAGNOSIS — R7989 Other specified abnormal findings of blood chemistry: Secondary | ICD-10-CM | POA: Insufficient documentation

## 2018-10-18 ENCOUNTER — Encounter: Payer: Self-pay | Admitting: Neurology

## 2018-10-18 ENCOUNTER — Ambulatory Visit (INDEPENDENT_AMBULATORY_CARE_PROVIDER_SITE_OTHER): Payer: Medicare Other | Admitting: Neurology

## 2018-10-18 ENCOUNTER — Encounter: Payer: Self-pay | Admitting: Internal Medicine

## 2018-10-18 ENCOUNTER — Telehealth: Payer: Self-pay | Admitting: *Deleted

## 2018-10-18 VITALS — BP 114/68 | HR 72 | Ht 68.0 in | Wt 268.0 lb

## 2018-10-18 DIAGNOSIS — I739 Peripheral vascular disease, unspecified: Secondary | ICD-10-CM | POA: Diagnosis not present

## 2018-10-18 DIAGNOSIS — I5042 Chronic combined systolic (congestive) and diastolic (congestive) heart failure: Secondary | ICD-10-CM | POA: Diagnosis not present

## 2018-10-18 DIAGNOSIS — G250 Essential tremor: Secondary | ICD-10-CM

## 2018-10-18 DIAGNOSIS — I2510 Atherosclerotic heart disease of native coronary artery without angina pectoris: Secondary | ICD-10-CM | POA: Diagnosis not present

## 2018-10-18 DIAGNOSIS — I110 Hypertensive heart disease with heart failure: Secondary | ICD-10-CM | POA: Diagnosis not present

## 2018-10-18 DIAGNOSIS — J441 Chronic obstructive pulmonary disease with (acute) exacerbation: Secondary | ICD-10-CM | POA: Diagnosis not present

## 2018-10-18 DIAGNOSIS — I255 Ischemic cardiomyopathy: Secondary | ICD-10-CM

## 2018-10-18 NOTE — Telephone Encounter (Signed)
CALLED PATIENT TO INFORM OF PET SCAN FOR 10-24-18 - ARRIVAL TIME- 3:30 PM @ [facility], PT. TO BE NPO- 6 HRS. PRIOR TO TEST, SPOKE WITH PATIENT'S WIFE- LINDA AND SHE IS AWARE OF THIS TEST

## 2018-10-21 ENCOUNTER — Ambulatory Visit (HOSPITAL_COMMUNITY)
Admission: RE | Admit: 2018-10-21 | Discharge: 2018-10-21 | Disposition: A | Discharge status: Home / Self Care | Payer: Medicare Other | Source: Ambulatory Visit | Attending: Internal Medicine | Admitting: Internal Medicine

## 2018-10-21 DIAGNOSIS — I739 Peripheral vascular disease, unspecified: Secondary | ICD-10-CM | POA: Diagnosis not present

## 2018-10-21 DIAGNOSIS — M79606 Pain in leg, unspecified: Secondary | ICD-10-CM | POA: Diagnosis not present

## 2018-10-24 ENCOUNTER — Ambulatory Visit (HOSPITAL_COMMUNITY)
Admission: RE | Admit: 2018-10-24 | Discharge: 2018-10-24 | Disposition: A | Discharge status: Home / Self Care | Payer: Medicare Other | Source: Ambulatory Visit | Attending: Radiation Oncology | Admitting: Radiation Oncology

## 2018-10-24 DIAGNOSIS — C3490 Malignant neoplasm of unspecified part of unspecified bronchus or lung: Secondary | ICD-10-CM | POA: Diagnosis not present

## 2018-10-24 DIAGNOSIS — R911 Solitary pulmonary nodule: Secondary | ICD-10-CM

## 2018-10-24 LAB — GLUCOSE, CAPILLARY: Glucose-Capillary: 103 mg/dL — ABNORMAL HIGH (ref 70–99)

## 2018-10-24 MED ORDER — FLUDEOXYGLUCOSE F - 18 (FDG) INJECTION
13.7000 | Freq: Once | INTRAVENOUS | Status: AC | PRN
  Administered 2018-10-24: 13.7 via INTRAVENOUS

## 2018-10-25 DIAGNOSIS — I255 Ischemic cardiomyopathy: Secondary | ICD-10-CM | POA: Diagnosis not present

## 2018-10-25 DIAGNOSIS — J441 Chronic obstructive pulmonary disease with (acute) exacerbation: Secondary | ICD-10-CM | POA: Diagnosis not present

## 2018-10-25 DIAGNOSIS — I2510 Atherosclerotic heart disease of native coronary artery without angina pectoris: Secondary | ICD-10-CM | POA: Diagnosis not present

## 2018-10-25 DIAGNOSIS — I5042 Chronic combined systolic (congestive) and diastolic (congestive) heart failure: Secondary | ICD-10-CM | POA: Diagnosis not present

## 2018-10-25 DIAGNOSIS — I739 Peripheral vascular disease, unspecified: Secondary | ICD-10-CM | POA: Diagnosis not present

## 2018-10-25 DIAGNOSIS — I110 Hypertensive heart disease with heart failure: Secondary | ICD-10-CM | POA: Diagnosis not present

## 2018-10-28 ENCOUNTER — Encounter: Payer: Self-pay | Admitting: Family

## 2018-10-28 ENCOUNTER — Ambulatory Visit (INDEPENDENT_AMBULATORY_CARE_PROVIDER_SITE_OTHER): Payer: Medicare Other | Admitting: Family

## 2018-10-28 ENCOUNTER — Other Ambulatory Visit: Payer: Self-pay

## 2018-10-28 VITALS — BP 105/61 | HR 88 | Temp 97.7°F | Resp 18 | Ht 68.0 in | Wt 271.0 lb

## 2018-10-28 DIAGNOSIS — I714 Abdominal aortic aneurysm, without rupture, unspecified: Secondary | ICD-10-CM

## 2018-10-28 DIAGNOSIS — I739 Peripheral vascular disease, unspecified: Secondary | ICD-10-CM | POA: Diagnosis not present

## 2018-10-28 DIAGNOSIS — Z95828 Presence of other vascular implants and grafts: Secondary | ICD-10-CM | POA: Diagnosis not present

## 2018-10-28 DIAGNOSIS — I255 Ischemic cardiomyopathy: Secondary | ICD-10-CM | POA: Diagnosis not present

## 2018-10-28 NOTE — Progress Notes (Signed)
[facility] [place]  CC: Follow up s/p Endovascular Repair of Abdominal Aortic Aneurysm, aortoiliac occlusive disease     History of Present Illness  [name] is a 72 y.o. (1946/01/29) male who is s/p EVAR (Date: 01/06/14) by Dr. Bridgett Larsson.  EVAR duplex (Date: 10/06/16) demonstrated:noendoleak and ? Increasedsac size. Most recent CTA (Date: 07/20/17) demonstrates:noendoleak and slightly decreasedsac size. The patient has neverhad back or abdominal pain.  The patient has been diagnosed with lung cancer and is getting XRT for such.Pt is tolerating his XRT.  In regards to his legs, the patient has limited L leg intermittent claudication.  He doesn't feel this interferes with his ADL.  He also notes nocturnal cramps in left calf.  His sx are not c/w rest pain.    Dr. Bridgett Larsson last evaluated pt on 02-06-18. At that time pt's primary issue was his Lung cancer.  His EVAR duplex demonstrated stable size today without any obvious endoleak.  The velocities in the limbs look acceptable. In regards to his PAD, Dr. Bridgett Larsson advised medical manage given the bigger issue noted above.  Message to pt from Dr. Louie Bun on 10-18-18:  "The ultrasound was negative for blood clots in your legs (venous duplex).  They did, however, note that it looks like an artery in your left groin is occluded.  Please get in touch with your vascular doctor as soon as possible."   Pt is being seen by Dr. Carles Collet for essential tremor. Excerpt from her note on 10-18-18 as follows: "1.  Tremor             -I did tell the patient that while he likely has essential tremor, because he is also on amiodarone it would be difficult to rule out that as the cause of tremor, or at least a significant source of exacerbation of tremor.  His wife indicates that tremor started around the time as amiodarone was started.   I told the patient that about 1/3 of patients who are on amiodarone will have some degree of tremor.  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   Cardiology records indicate that amiodarone is at the lowest dose possible at this point in time.  Cardiology did clear him for DBS surgery.  He would need clearance from pulmonary given COPD, although patient states that he actually got this clearance already.             -we had previously planned for unilateral left VIM surgery (even though that the left hand is more affected than the right, he is right-handed).  We discussed various makers of the device and ultimately determined that he would be best fit with a Product/process development scientist.  However, since that time, he has had had recent CT chest that was suspicious for possible recurrent tumor. Awaiting a PET scan to be scheduled.  I talked to Dr. Vertell Limber and Dr. Sondra Come and both agree that surgery needs to be cx for now.   Will need to make sure disease free before proceed with DBS.  Once he is cleared from that standpoint, we can schedule him for DBS.  Pt and wife were agreeable.             -asks about valium.  He is using that about 1 time a week when goes out to eat.  I don't have much objection but not sure  that it will help a lot.  He is not to drive when using it.   2.  He is to call me after other medical problems are taken care of.  "   Diabetic: No Tobaccos use: former smoker, quit about 2009, smoked for about 30 years   Past Medical History:  Diagnosis Date  . AAA (abdominal aortic aneurysm) ([place])    followed by Dr. Bridgett Larsson  . Adenomatous colon polyp 01/2004  . Anemia    hx  . Automatic implantable cardioverter-defibrillator in situ   . AVM (arteriovenous malformation)   . BPH (benign prostatic hypertrophy)   . CAD (coronary artery disease)    s/p CABGx2 in 1996  . Carotid artery stenosis    LCEA - Dr. Bridgett Larsson in 2013  . CHF (congestive heart failure) ([place])   . Complication of anesthesia    claustrophobic, unabe to lie  on back more than 4 hours at time due to back  . COPD (chronic obstructive pulmonary disease) ([place])   . Diabetes mellitus    DIET CONTROLLED- pt states that this was a misdiagnosis, he was treated while in the hospital  but returned home, loss a massive amount of weight and he has not had a problem with his blood sugar since. States everything has been normal for about 3 years.  . Diverticulosis   . Dyspnea   . Dysrhythmia    ICD-defibrillator  . Fatigue   . GERD (gastroesophageal reflux disease)   . H/O hiatal hernia   . History of radiation therapy 04/09/17-04/17/17   SBRT right lung 54 Gy in 3 fractions  . HLD (hyperlipidemia)   . Hypertension   . Hypothyroidism   . Ischemic cardiomyopathy   . Myocardial infarction ([place])   . NSCL ca   . OSA on CPAP    AHI durign total sleep 14.69/hr, during REM 50.91/hr  . Peripheral vascular disease ([facility])    LCEA, L renal artery stent, bilat iliac stents, R SFA stenosis  . Tremor   . Ventricular tachycardia ([place]) 09/09/2014   Amiodarone was started after appropriate defibrillator shocks for ventricular tachycardia in October 2008   Past Surgical History:  Procedure Laterality Date  . ABDOMINAL AORTIC ENDOVASCULAR STENT GRAFT [name] 01/06/2014   Procedure: ABDOMINAL AORTIC ENDOVASCULAR STENT GRAFT- GORE; ULTRASOUND GUIDED;  Surgeon: Conrad Croswell, MD;  Location: [facility];  Service: Vascular;  Laterality: [name];  . ANGIOPLASTY     BILATERAL  LE  W/STENTS  . BI-VENTRICULAR IMPLANTABLE CARDIOVERTER DEFIBRILLATOR UPGRADE [name] 10/09/2014   Procedure: BI-VENTRICULAR IMPLANTABLE CARDIOVERTER DEFIBRILLATOR UPGRADE;  Surgeon: Evans Lance, MD;  Location: [facility] CATH LAB;  Service: Cardiovascular;  Laterality: [name];  . BIV ICD GENERTAOR CHANGE OUT  10/09/2014   UPGRADE TO BIV        BY DR Lovena Le  . CARDIAC CATHETERIZATION  09/16/2007   occlusion of both vein grafts, no significant LAD disease or diagonal disease, Cfx collaterals from the left, 70% in-stent restenosis of  L renal artery, normal L main, RCA occluded ostially (Dr. Adora Fridge)  . CARDIAC CATHETERIZATION  10/03/2002   SVG sequentially to OM1 & OM2 totally occluded at ostium, SVG to PDA totally occluded within previously placed prox vein graft stent, smal distal AAA, bialt iliac stents with 30% left end-stent restenosis and 50% right end-stent restnosis(Dr. Gerrie Nordmann)  . CARDIAC CATHETERIZATION  06/11/1998   L main with 20% narrowing in distal 1/3; LAD with 1st diagonla having 70% ostial narrowing, 2nd diagonal with 40%  narrowing in prox third, LIMA & RIMA widely patent; in-stent restenosis of RCIA with successful PTA and new prox SVTRCA stent for residual disease (Dr. Marella Chimes)  . CARDIAC DEFIBRILLATOR PLACEMENT  06/2005   Guidant Vitality HE - ischemic cardiomyopathy - Dr. Marella Chimes  . CAROTID ENDARTERECTOMY Left 01/04/12  . CORONARY ANGIOPLASTY  01/08/2004   cutting balloon atherectomy & percutaneous intervention of RCIA in-stent restenosis (Dr. Marella Chimes)  . CORONARY ARTERY BYPASS GRAFT  11/04/1985   x2 - PDA and sequential DX-OM (Dr. Redmond Pulling)  . ENDARTERECTOMY  01/04/2012   Procedure: ENDARTERECTOMY CAROTID;left  Surgeon: Hinda Lenis, MD;  Location: [facility];  Service: Vascular;  Laterality: Left;  with patch angioplasty  . FUDUCIAL PLACEMENT [name] 02/23/2017   Procedure: PLACEMENT OF FUDUCIAL;  Surgeon: [name], MD;  Location: [facility];  Service: Thoracic;  Laterality: [name];  . ICD GENERATOR CHANGE  05/02/2010   [employer], retail  . ILIAC ARTERY STENT Bilateral 03/1997   and L SFA PTA  . Iron infusion  June 16, 2012  . LEFT HEART CATHETERIZATION WITH CORONARY ANGIOGRAM [name] 07/06/2014   Procedure: LEFT HEART CATHETERIZATION WITH CORONARY ANGIOGRAM;  Surgeon: Peter M Martinique, MD;  [facility] CATH LAB;  Service: Cardiovascular;  Laterality: [name];  . NM MYOCAR PERF WALL MOTION  09/2012   lexiscan myoview - mod-severe perfusion defect r/t infarct or scar w/mild periinfarct  ishcemia in basal inferior, mid inferior, apical inferior, basal inferolateral & mid inferoalteral regions - EF 21% low risk scan  . POLYPECTOMY    . RENAL ARTERY STENT Left 03/24/2004   6x68m Genesis stent (Dr. RMarella Chimes  . RIGHT/LEFT HEART CATH AND CORONARY ANGIOGRAPHY [name] 12/28/2017   Procedure: RIGHT/LEFT HEART CATH AND CORONARY ANGIOGRAPHY;  Surgeon: MLarey Dresser MD;  [facility];  Service: Cardiovascular;  Laterality: [name];  . TRANSTHORACIC ECHOCARDIOGRAM  12/2012   EF 30-35; LV mod to severely dilated, mod concentric hypertrophy, severe hypokinesis of inferolateral myocardium, moderate hypokineis of anteroseptal region, grade 1 diastolic dysfunction; mod MR; LA mod-severely dialted; RV mod dialted; RA mildly dilated; PA peak pressure 339mg  . VIDEO BRONCHOSCOPY WITH ENDOBRONCHIAL NAVIGATION [name] 02/23/2017   Procedure: VIDEO BRONCHOSCOPY WITH ENDOBRONCHIAL NAVIGATION;  Surgeon: StMelrose NakayamaMD;  [facility] [facility] Service: Thoracic;  Laterality: [name];   Social History Social History   Tobacco Use  . Smoking status: Former Smoker    Packs/day: 1.50    Years: 40.00    Pack years: 60.00    Types: Cigarettes, Pipe    Last attempt to quit: 12/11/2002    Years since quitting: 15.8  . Smokeless tobacco: Never Used  Substance Use Topics  . Alcohol use: No    Alcohol/week: 0.0 standard drinks  . Drug use: No   Family History Family History  Problem Relation Age of Onset  . Lung cancer Mother   . Cancer Mother   . Diabetes Mother   . Hypertension Mother   . Hyperlipidemia Mother   . Heart disease Mother        before age 73. Heart attack Father   . Heart disease Father        before age 73. Hypertension Father   . Colon cancer Sister   . Cancer Sister   . Hypertension Sister   . Hyperlipidemia Sister   . Parkinson's disease Sister   . Diabetes Sister   . Heart disease Sister  before age 24  . Heart attack Maternal Grandmother   .  Diabetes Daughter    Current Outpatient Medications on File Prior to Visit  Medication Sig Dispense Refill  . albuterol (PROVENTIL HFA;VENTOLIN HFA) 108 (90 BASE) MCG/ACT inhaler Inhale 2 puffs into the lungs every 6 (six) hours as needed for wheezing or shortness of breath. 3 Inhaler 4  . albuterol (PROVENTIL) (2.5 MG/3ML) 0.083% nebulizer solution Take 3 mLs (2.5 mg total) by nebulization every 6 (six) hours. 120 mL 3  . amiodarone (PACERONE) 200 MG tablet Take 100 mg by mouth daily.    Marland Kitchen arformoterol (BROVANA) 15 MCG/2ML NEBU Take 2 mLs (15 mcg total) by nebulization 2 (two) times daily. 120 mL 5  . aspirin 81 MG tablet Take 81 mg by mouth daily.    Marland Kitchen atorvastatin (LIPITOR) 40 MG tablet TAKE 1 TABLET BY MOUTH  DAILY 90 tablet 3  . budesonide (PULMICORT) 0.5 MG/2ML nebulizer solution Take 2 mLs (0.5 mg total) by nebulization 2 (two) times daily. 120 mL 5  . carvedilol (COREG) 25 MG tablet TAKE 1 TABLET BY MOUTH  TWICE A DAY WITH MEALS 180 tablet 3  . clopidogrel (PLAVIX) 75 MG tablet TAKE 1 TABLET BY MOUTH  DAILY 90 tablet 3  . diazepam (VALIUM) 5 MG tablet 1-2 tabs every 8 hours if needed for tremor 30 tablet 5  . fluticasone (FLONASE) 50 MCG/ACT nasal spray Instill 1 spray in each  nostril daily 32 g 3  . Glycopyrrolate (LONHALA MAGNAIR REFILL KIT) 25 MCG/ML SOLN Inhale 1 Act into the lungs 2 (two) times daily. 60 mL 11  . isosorbide mononitrate (IMDUR) 60 MG 24 hr tablet TAKE 1 TABLET BY MOUTH  DAILY 90 tablet 2  . KLOR-CON M20 20 MEQ tablet TAKE 1 TABLET BY MOUTH EVERY DAY 90 tablet 2  . levothyroxine (SYNTHROID, LEVOTHROID) 50 MCG tablet TAKE 1 TABLET BY MOUTH  DAILY BEFORE BREAKFAST 90 tablet 0  . nitroGLYCERIN (NITROSTAT) 0.4 MG SL tablet DISSOLVE 1 TABLET UNDER THE TONGUE EVERY 5 MINUTES AS  NEEDED FOR CHEST PAIN , MAX 3 TABLETS IN 15 MIN, CALL  911 IF CHEST PAIN PERSISTS 25 tablet 0  . NON FORMULARY at bedtime. CPAP And during the day as needed    . sacubitril-valsartan (ENTRESTO)  49-51 MG Take 1 tablet by mouth 2 (two) times daily. 60 tablet 3  . spironolactone (ALDACTONE) 25 MG tablet TAKE 1 TABLET BY MOUTH  DAILY 90 tablet 3  . torsemide (DEMADEX) 20 MG tablet Take 4 tablets (80 mg total) by mouth daily. 180 tablet 3   No current facility-administered medications on file prior to visit.    Allergies  Allergen Reactions  . Tussionex Pennkinetic Er [Hydrocod Polst-Cpm Polst Er] Other (See Comments)    UNSPECIFIED REACTION  > "caused prostate problems"  . Ace Inhibitors Cough     ROS: See HPI for pertinent positives and negatives.  Physical Examination  Vitals:   10/28/18 1529  BP: 105/61  Pulse: 88  Resp: 18  Temp: 97.7 F (36.5 C)  TempSrc: Oral  SpO2: 94%  Weight: 271 lb (122.9 kg)  Height: _0  (1.727 m)   Body mass index is 41.21 kg/m.  General: A&O x 3, WD, morbidly obese, chronically ill appearing male HEENT: large neck Pulmonary: Sym exp, respirations are slightly labored at rest, limited air movement in all fields, no rales, rhonchi, or wheezes.  Cardiac: Regular rhythm and rate, no murmur appreciated  Vascular: Vessel Right Left  Radial Palpable Palpable  Carotid  without bruit  without bruit  Aorta Not palpable [name]  Femoral 2+Palpable 1+Palpable  Popliteal Not palpable Not palpable  PT Not Palpable Not Palpable  DP 1+Palpable Not Palpable   Gastrointestinal: soft, NTND, -G/R, - HSM, - palpable masses, large panus, - CVAT B. Musculoskeletal: M/S 5/5 in upper extremities, 4/5 in lower extremities, extremities without ischemic changes. Both feet are pink and warm.  Skin: No rashes, no ulcers, no cellulitis.   Neurologic: Pain and light touch intact in extremities, Motor exam as listed above. Psychiatric: Normal thought content, mood appropriate for clinical situation.    DATA  ABI (Date: 10-21-18, requested by Dr. [name]):  R:   ABI: 0.85 (was 0.78 on 02-06-18),   PT: mono  DP: bi  TBI:  0.72, toe pressure 73,  (was 0.61)   L:   ABI: 0.80 (was 0.59),   PT: mono  DP: mono  TBI: 0.70, toe pressure 71, (was 0.49) Improved bilateral ABI and TBI, mild disease bilaterally. Bi and monophasic waveforms in the right, monophasic in the left.    CTA Abd/Pelvis Duplex (Date: 07-20-17) . No evidence of endoleak with slightly decreasing size of the abdominal aortic aneurysm sac. The only change in appearance of the endograft is development of a small amount of anterior mural thrombus within the main body of the graft measuring approximately 6 mm in maximal thickness. 2. Potential significant stenosis involving the distal aspect of the native left common iliac artery just beyond the endograft. This is not well evaluated on the study. 3. Left SFA occlusion just beyond its origin.   EVAR Duplex (02/06/2018): Abdominal Aorta: The largest aortic measurement is 5.0 cm. The largest aortic diameter remains essentially unchanged compared to prior exam. Previous diameter measurement was 5.0 cm obtained on CT on 07/20/17.  All velocities obtained in the left iliac arteries are within the >50% stenosis range.  Aortoiliac Duplex (01-27-18): Visualization of the Aorta, left common iliac artery was limited.  Limitations: Obesity and air/bowel gas.    Medical Decision Making   [name] is a 72 y.o. male who is s/p EVAR (01/06/14), acute on chronic heart failure, Lung cancer, BLE mild PAD. Known potential significant stenosis involving the distal aspect of the native left common iliac artery just beyond the endograft. This is not well evaluated on the study. Left SFA occlusion just beyond its origin. From CTA abd/pelvis on 07-20-17.  Left femoral pulse is 1+ palpable, despite morbid obesity.  Both feet are pink and warm.   Serum creatinine was 1.42 on 10-15-18.    The next endograft duplex will be scheduled for February 2020, will include bilateral aortoiliac duplex to try to view left common and external  iliac arteries, will include ABI's.   The patient will follow up with Dr. Carlis Abbott at that time.  I emphasized the importance of maximal medical management including strict control of blood pressure, blood glucose, and lipid levels, antiplatelet agents, obtaining regular exercise, and cessation of smoking.   Thank you for allowing [place] to participate in this patient's care.  Clemon Chambers, RN, MSN, FNP-C [facility] [place] [facility] Office: [phone]  Clinic Physician: Trula Slade  10/28/2018, 6:14 PM

## 2018-10-28 NOTE — Patient Instructions (Signed)
Before your next abdominal ultrasound:  Avoid gas forming foods and beverages the day before the test.   Take two Extra-Strength Gas-X capsules at bedtime the night before the test. Take another two Extra-Strength Gas-X capsules in the middle of the night if you get up to the restroom, if not, first thing in the morning with water.  Do not chew gum.     

## 2018-11-01 DIAGNOSIS — I2510 Atherosclerotic heart disease of native coronary artery without angina pectoris: Secondary | ICD-10-CM | POA: Diagnosis not present

## 2018-11-01 DIAGNOSIS — I5042 Chronic combined systolic (congestive) and diastolic (congestive) heart failure: Secondary | ICD-10-CM | POA: Diagnosis not present

## 2018-11-01 DIAGNOSIS — I255 Ischemic cardiomyopathy: Secondary | ICD-10-CM | POA: Diagnosis not present

## 2018-11-01 DIAGNOSIS — I739 Peripheral vascular disease, unspecified: Secondary | ICD-10-CM | POA: Diagnosis not present

## 2018-11-01 DIAGNOSIS — I110 Hypertensive heart disease with heart failure: Secondary | ICD-10-CM | POA: Diagnosis not present

## 2018-11-01 DIAGNOSIS — J441 Chronic obstructive pulmonary disease with (acute) exacerbation: Secondary | ICD-10-CM | POA: Diagnosis not present

## 2018-11-11 ENCOUNTER — Other Ambulatory Visit: Payer: Self-pay | Admitting: Cardiovascular Disease

## 2018-11-11 ENCOUNTER — Other Ambulatory Visit: Payer: Self-pay | Admitting: Internal Medicine

## 2018-11-14 ENCOUNTER — Ambulatory Visit (HOSPITAL_COMMUNITY): Payer: Medicare Other

## 2018-11-14 ENCOUNTER — Encounter (HOSPITAL_COMMUNITY): Payer: Self-pay

## 2018-11-14 ENCOUNTER — Ambulatory Visit (HOSPITAL_COMMUNITY): Admit: 2018-11-14 | Payer: Medicare Other | Admitting: Neurosurgery

## 2018-11-14 DIAGNOSIS — J441 Chronic obstructive pulmonary disease with (acute) exacerbation: Secondary | ICD-10-CM | POA: Diagnosis not present

## 2018-11-14 DIAGNOSIS — I110 Hypertensive heart disease with heart failure: Secondary | ICD-10-CM | POA: Diagnosis not present

## 2018-11-14 DIAGNOSIS — I2510 Atherosclerotic heart disease of native coronary artery without angina pectoris: Secondary | ICD-10-CM | POA: Diagnosis not present

## 2018-11-14 DIAGNOSIS — I5042 Chronic combined systolic (congestive) and diastolic (congestive) heart failure: Secondary | ICD-10-CM | POA: Diagnosis not present

## 2018-11-14 DIAGNOSIS — I255 Ischemic cardiomyopathy: Secondary | ICD-10-CM | POA: Diagnosis not present

## 2018-11-14 DIAGNOSIS — I739 Peripheral vascular disease, unspecified: Secondary | ICD-10-CM | POA: Diagnosis not present

## 2018-11-14 SURGERY — MINOR PLACEMENT OF FIDUCIAL
Anesthesia: Monitor Anesthesia Care

## 2018-11-18 NOTE — Progress Notes (Signed)
Thoracic Location of Tumor / Histology: right upper lobe suspicious for recurrent tumor.   Tobacco/Marijuana/Snuff/ETOH use:  former smoker - quit in 2004 - smoked 1.5 ppd for 40 years, denies marijuana, snuff or ETOH use  Past/Anticipated interventions by cardiothoracic surgery, if any: None at this time.  Past/Anticipated interventions by medical oncology, if any: None at this time  Signs/Symptoms  Weight changes, if any: No  Respiratory complaints, if any: SOB at all time. Cough, non-productive  Hemoptysis, if any: No  Pain issues, if any:  No   SAFETY ISSUES:  Prior radiation? Site/dose:  Right lung/ 54 Gy in 3 fractions  Pacemaker/ICD? Yes  Possible current pregnancy?[name], pt is male  Is the patient on methotrexate? No  Current Complaints / other details:  Pt presents today for reconsult with Dr. Sondra Come for Radiation Oncology. Pt is accompanied by wife. Pt has a Pacemaker/AICD implanted.  BP (!) 143/122 (BP Location: Left Arm)   Pulse 62   Temp (!) 97.5 F (36.4 C) (Oral)   Resp (!) 22   Ht 5\' 8"  (1.727 m)   Wt 266 lb (120.7 kg)   SpO2 98%   BMI 40.45 kg/m   Wt Readings from Last 3 Encounters:  11/21/18 266 lb (120.7 kg)  10/28/18 271 lb (122.9 kg)  10/18/18 268 lb (121.6 kg)    Loma Sousa, RN BSN

## 2018-11-19 ENCOUNTER — Ambulatory Visit (INDEPENDENT_AMBULATORY_CARE_PROVIDER_SITE_OTHER): Payer: Medicare Other

## 2018-11-19 DIAGNOSIS — I255 Ischemic cardiomyopathy: Secondary | ICD-10-CM | POA: Diagnosis not present

## 2018-11-19 DIAGNOSIS — I428 Other cardiomyopathies: Secondary | ICD-10-CM

## 2018-11-19 DIAGNOSIS — I429 Cardiomyopathy, unspecified: Secondary | ICD-10-CM

## 2018-11-21 ENCOUNTER — Ambulatory Visit
Admission: RE | Admit: 2018-11-21 | Discharge: 2018-11-21 | Disposition: A | Discharge status: Home / Self Care | Payer: Medicare Other | Source: Ambulatory Visit | Attending: Radiation Oncology | Admitting: Radiation Oncology

## 2018-11-21 ENCOUNTER — Other Ambulatory Visit: Payer: Self-pay

## 2018-11-21 ENCOUNTER — Encounter: Payer: Self-pay | Admitting: Radiation Oncology

## 2018-11-21 VITALS — BP 143/122 | HR 62 | Temp 97.5°F | Resp 22 | Ht 68.0 in | Wt 266.0 lb

## 2018-11-21 DIAGNOSIS — Z79899 Other long term (current) drug therapy: Secondary | ICD-10-CM | POA: Insufficient documentation

## 2018-11-21 DIAGNOSIS — R05 Cough: Secondary | ICD-10-CM | POA: Diagnosis not present

## 2018-11-21 DIAGNOSIS — Z7982 Long term (current) use of aspirin: Secondary | ICD-10-CM | POA: Diagnosis not present

## 2018-11-21 DIAGNOSIS — R0602 Shortness of breath: Secondary | ICD-10-CM | POA: Diagnosis not present

## 2018-11-21 DIAGNOSIS — Z510 Encounter for antineoplastic radiation therapy: Secondary | ICD-10-CM | POA: Diagnosis not present

## 2018-11-21 DIAGNOSIS — Z9981 Dependence on supplemental oxygen: Secondary | ICD-10-CM | POA: Diagnosis not present

## 2018-11-21 DIAGNOSIS — Z85118 Personal history of other malignant neoplasm of bronchus and lung: Secondary | ICD-10-CM | POA: Diagnosis not present

## 2018-11-21 DIAGNOSIS — R5383 Other fatigue: Secondary | ICD-10-CM | POA: Insufficient documentation

## 2018-11-21 DIAGNOSIS — Z08 Encounter for follow-up examination after completed treatment for malignant neoplasm: Secondary | ICD-10-CM | POA: Diagnosis not present

## 2018-11-21 DIAGNOSIS — R918 Other nonspecific abnormal finding of lung field: Secondary | ICD-10-CM | POA: Diagnosis not present

## 2018-11-21 DIAGNOSIS — R911 Solitary pulmonary nodule: Secondary | ICD-10-CM

## 2018-11-21 NOTE — Progress Notes (Signed)
Remote ICD transmission.   

## 2018-11-21 NOTE — Progress Notes (Signed)
Radiation Oncology         [phone] ________________________________  Name: [name] MRN: [id number]  Date: 11/21/2018  DOB: Apr 13, 1946  Re-Consultation Note  CC: [name], MD  [name], MD    ICD-10-CM   1. Nodule of right lung R91.1     Diagnosis:  Suspicious Right Upper Lobe Recurrence  Interval Since Last Radiation:  9 months and 1 week;  (1st course: 1 year and 7 months)  2/25, 2/27, 02/08/18: Right lower lung / 54 Gy in 3 fractions (SBRT)  4/30, 5/3, 04/17/17: Right lung upper lung / 54 Gy in 3 fractions (SBRT)  Narrative:  The patient returns today for re-consultation. Follow up chest CT scan performed on 09/16/18 demonstrated 2 areas of progressive density suspicious for recurrent tumor in the right upper chest in the area of his previous initial course of SBRT. He proceeded to PET scan on 10/24/18, which confirmed abnormal increased uptake within the right upper lobe. A mild increased uptake was also noted to the posterior left upper lobe nodular density (SUV max 2.16). The patient comes in today to review his PET scan and discuss further radiation treatment options.  He reports shortness of breath at all times, chronic fatigue, and dry cough. He reports improvement in his breathing, but he requires 2.5L of oxygen and uses CPAP at night.                        ALLERGIES:  is allergic to tussionex pennkinetic er [hydrocod polst-cpm polst er] and ace inhibitors.  Meds: Current Outpatient Medications  Medication Sig Dispense Refill  . albuterol (PROVENTIL HFA;VENTOLIN HFA) 108 (90 BASE) MCG/ACT inhaler Inhale 2 puffs into the lungs every 6 (six) hours as needed for wheezing or shortness of breath. 3 Inhaler 4  . albuterol (PROVENTIL) (2.5 MG/3ML) 0.083% nebulizer solution Take 3 mLs (2.5 mg total) by nebulization every 6 (six) hours. 120 mL 3  . amiodarone (PACERONE) 200 MG tablet Take 100 mg by mouth daily.    Marland Kitchen arformoterol (BROVANA) 15 MCG/2ML NEBU  Take 2 mLs (15 mcg total) by nebulization 2 (two) times daily. 120 mL 5  . aspirin 81 MG tablet Take 81 mg by mouth daily.    Marland Kitchen atorvastatin (LIPITOR) 40 MG tablet TAKE 1 TABLET BY MOUTH  DAILY 90 tablet 3  . ATROVENT HFA 17 MCG/ACT inhaler TAKE 2 PUFFS BY MOUTH EVERY 4 HOURS AS NEEDED FOR WHEEZE  12  . budesonide (PULMICORT) 0.5 MG/2ML nebulizer solution Take 2 mLs (0.5 mg total) by nebulization 2 (two) times daily. 120 mL 5  . carvedilol (COREG) 25 MG tablet TAKE 1 TABLET BY MOUTH  TWICE A DAY WITH MEALS 180 tablet 3  . clopidogrel (PLAVIX) 75 MG tablet TAKE 1 TABLET BY MOUTH  DAILY 90 tablet 3  . diazepam (VALIUM) 5 MG tablet 1-2 tabs every 8 hours if needed for tremor 30 tablet 5  . fluticasone (FLONASE) 50 MCG/ACT nasal spray Instill 1 spray in each  nostril daily 32 g 3  . Glycopyrrolate (LONHALA MAGNAIR REFILL KIT) 25 MCG/ML SOLN Inhale 1 Act into the lungs 2 (two) times daily. 60 mL 11  . isosorbide mononitrate (IMDUR) 60 MG 24 hr tablet TAKE 1 TABLET BY MOUTH  DAILY 90 tablet 2  . KLOR-CON M20 20 MEQ tablet TAKE 1 TABLET BY MOUTH EVERY DAY 90 tablet 2  . levothyroxine (SYNTHROID, LEVOTHROID) 50 MCG tablet TAKE 1 TABLET BY  MOUTH  DAILY BEFORE BREAKFAST 90 tablet 0  . nitroGLYCERIN (NITROSTAT) 0.4 MG SL tablet DISSOLVE 1 TABLET UNDER THE TONGUE EVERY 5 MINUTES AS  NEEDED FOR CHEST PAIN , MAX 3 TABLETS IN 15 MIN, CALL  911 IF CHEST PAIN PERSISTS 25 tablet 0  . NON FORMULARY at bedtime. CPAP And during the day as needed    . sacubitril-valsartan (ENTRESTO) 49-51 MG Take 1 tablet by mouth 2 (two) times daily. 60 tablet 3  . spironolactone (ALDACTONE) 25 MG tablet TAKE 1 TABLET BY MOUTH  DAILY 90 tablet 3  . torsemide (DEMADEX) 20 MG tablet Take 4 tablets (80 mg total) by mouth daily. 180 tablet 3   No current facility-administered medications for this encounter.     Physical Findings: The patient is in no acute distress. Patient is alert and oriented.  height is 5' 8"  (1.727 m) and  weight is 266 lb (120.7 kg). His oral temperature is 97.5 F (36.4 C) (abnormal). His blood pressure is 143/122 (abnormal) and his pulse is 62. His respiration is 22 (abnormal) and oxygen saturation is 98%.  No significant changes. Lungs are clear to auscultation bilaterally. Heart has regular rate and rhythm. No palpable cervical, supraclavicular, or axillary adenopathy. Abdomen soft, non-tender, normal bowel sounds.   Lab Findings: Lab Results  Component Value Date   WBC 9.1 10/15/2018   HGB 15.2 10/15/2018   HCT 45.2 10/15/2018   MCV 93.2 10/15/2018   PLT 199.0 10/15/2018    Radiographic Findings: Nm Pet Image Restag (ps) Skull Base To Thigh  Result Date: 10/25/2018 CLINICAL DATA:  Subsequent treatment strategy for lung cancer . EXAM: NUCLEAR MEDICINE PET SKULL BASE TO THIGH TECHNIQUE: 13.7 mCi F-18 FDG was injected intravenously. Full-ring PET imaging was performed from the skull base to thigh after the radiotracer. CT data was obtained and used for attenuation correction and anatomic localization. Fasting blood glucose: 103 mg/dl COMPARISON:  01/17/2017 FINDINGS: Mediastinal blood pool activity: SUV max 3.38 NECK: No hypermetabolic lymph nodes in the neck. Incidental CT findings: none CHEST: No hypermetabolic axillary, supraclavicular, mediastinal, or hilar lymph nodes. There is abnormal increased radiotracer uptake localizing to 1.6 x 1.7 cm medial right upper lobe nodular density, image 21/8. Suspicious for recurrent tumor. The hypermetabolism extends into the area geographic area of fibrosis secondary to external beam radiation, and also extends towards the suprahilar region. The nodular density within the posterior left upper lobe described on recent chest CT exhibits mild uptake within SUV max of 2.16. Incidental CT findings: Moderate change of emphysema. Previous median sternotomy and CABG procedure. The heart size is enlarged. Aortic atherosclerosis. A left chest wall ICD is noted with  leads in the right atrial appendage and right ventricle. ABDOMEN/PELVIS: No abnormal hypermetabolic activity within the liver, pancreas, adrenal glands, or spleen. No hypermetabolic lymph nodes in the abdomen or pelvis. Incidental CT findings: Infrarenal abdominal aortic aneurysm is noted status post stent graft repair. The aneurysm sac measures 4.7 cm, image 133/4. Previously 4.9 cm. SKELETON: No focal hypermetabolic activity to suggest skeletal metastasis. Incidental CT findings: none IMPRESSION: 1. There is abnormal increased radiotracer uptake localizing to the progressive increased soft tissue within the right upper lobe suspicious for recurrent tumor. 2. Mild increased radiotracer uptake is noted localizing to the posterior left upper lobe nodular density with SUV max of 2.16, nonspecific. 3. Aortic Atherosclerosis (ICD10-I70.0) and Emphysema (ICD10-J43.9). Electronically Signed   By: Kerby Moors M.D.   On: 10/25/2018 10:02    Impression:  Probably  recurrence in the right upper lobe area. This is adjacent to his previous SBRT field, for which the patient was treated in 4/30, 5/3, 04/17/17. We discussed potential options, with one being presentation at the multidisciplinary thoracic conference for discussion of treatment management. We also discussed close follow up with repeat chest CT scan in 3 months. Third option is proceeding with additional radiation therapy without additional tissue. The patient discussed with his wife, who is his healthcare power of attorney. The both agreed to proceed with radiation therapy. We discussed that since he's had prior SBRT to this area, he would not be a candidate for additional SBRT for management of this recurrence. He would be a candidate for conventional radiation therapy, extending over 6 weeks of treatment.  Today, I talked to the patient and wife about the findings and work-up thus far.  We discussed the natural history of recurrent lung cancer and general  treatment, highlighting the role of radiotherapy in the management.  We discussed the available radiation techniques, and focused on the details of logistics and delivery.  We reviewed the anticipated acute and late sequelae associated with radiation in this setting.  The patient was encouraged to ask questions that I answered to the best of my ability.  A patient consent form was discussed and signed.  We retained a copy for our records.  The patient would like to proceed with radiation and will be scheduled for CT simulation.   The patient does understand with repeat radiation treatment to this area, he is a risk for additional fibrosis and higher risk for major complications in light of re-treatment issues. He unfortunately does not have any other options at this time.  Plan:  CT simulation is scheduled for next Wednesday, 11/27/18 at 1 pm.  -----------------------------------  Blair Promise, PhD, MD  This document serves as a record of services personally performed by Gery Pray, MD. It was created on his behalf by Wilburn Mylar, a trained medical scribe. The creation of this record is based on the scribe's personal observations and the provider's statements to them. This document has been checked and approved by the attending provider.

## 2018-11-22 ENCOUNTER — Inpatient Hospital Stay (HOSPITAL_COMMUNITY): Admit: 2018-11-22 | Payer: Medicare Other | Admitting: Neurosurgery

## 2018-11-22 ENCOUNTER — Encounter (HOSPITAL_COMMUNITY): Payer: Self-pay

## 2018-11-22 DIAGNOSIS — I5042 Chronic combined systolic (congestive) and diastolic (congestive) heart failure: Secondary | ICD-10-CM | POA: Diagnosis not present

## 2018-11-22 DIAGNOSIS — J441 Chronic obstructive pulmonary disease with (acute) exacerbation: Secondary | ICD-10-CM | POA: Diagnosis not present

## 2018-11-22 DIAGNOSIS — I2510 Atherosclerotic heart disease of native coronary artery without angina pectoris: Secondary | ICD-10-CM | POA: Diagnosis not present

## 2018-11-22 DIAGNOSIS — I110 Hypertensive heart disease with heart failure: Secondary | ICD-10-CM | POA: Diagnosis not present

## 2018-11-22 DIAGNOSIS — I739 Peripheral vascular disease, unspecified: Secondary | ICD-10-CM | POA: Diagnosis not present

## 2018-11-22 DIAGNOSIS — I255 Ischemic cardiomyopathy: Secondary | ICD-10-CM | POA: Diagnosis not present

## 2018-11-22 SURGERY — SUBTHALAMIC STIMULATOR INSERTION
Anesthesia: General | Laterality: Bilateral

## 2018-11-26 ENCOUNTER — Other Ambulatory Visit (HOSPITAL_COMMUNITY): Payer: Self-pay | Admitting: Cardiology

## 2018-11-27 ENCOUNTER — Ambulatory Visit
Admission: RE | Admit: 2018-11-27 | Discharge: 2018-11-27 | Disposition: A | Discharge status: Home / Self Care | Payer: Medicare Other | Source: Ambulatory Visit | Attending: Radiation Oncology | Admitting: Radiation Oncology

## 2018-11-27 DIAGNOSIS — R5383 Other fatigue: Secondary | ICD-10-CM | POA: Diagnosis not present

## 2018-11-27 DIAGNOSIS — R0602 Shortness of breath: Secondary | ICD-10-CM | POA: Diagnosis not present

## 2018-11-27 DIAGNOSIS — R911 Solitary pulmonary nodule: Secondary | ICD-10-CM

## 2018-11-27 DIAGNOSIS — Z510 Encounter for antineoplastic radiation therapy: Secondary | ICD-10-CM | POA: Diagnosis not present

## 2018-11-27 DIAGNOSIS — R05 Cough: Secondary | ICD-10-CM | POA: Diagnosis not present

## 2018-11-27 DIAGNOSIS — Z79899 Other long term (current) drug therapy: Secondary | ICD-10-CM | POA: Diagnosis not present

## 2018-11-27 NOTE — Progress Notes (Signed)
  Radiation Oncology         [phone] ________________________________  Name: [name] MRN: [id number]  Date: 11/27/2018  DOB: 10-26-1946  SIMULATION AND TREATMENT PLANNING NOTE   DIAGNOSIS: Presumptive Right Upper Lobe Recurrence  NARRATIVE:  The patient was brought to the [place] suite.  Identity was confirmed.  All relevant records and images related to the planned course of therapy were reviewed.  The patient freely provided informed written consent to proceed with treatment after reviewing the details related to the planned course of therapy. The consent form was witnessed and verified by the simulation staff.  Then, the patient was set-up in a stable reproducible  supine position for radiation therapy.  CT images were obtained.  Surface markings were placed.  The CT images were loaded into the planning software.  Then the target and avoidance structures were contoured.  Treatment planning then occurred.  The radiation prescription was entered and confirmed.  Then, I designed and supervised the construction of a total of 2 medically necessary complex treatment devices.  I have requested : Intensity Modulated Radiotherapy (IMRT) is medically necessary for this case for the following reason:  Previous treatment to this area..  I have ordered:dose calc.  PLAN:  The patient will receive 60 Gy in 30 fractions.  -----------------------------------  Blair Promise, PhD, MD  This document serves as a record of services personally performed by Gery Pray, MD. It was created on his behalf by Mary-Margaret Loma Messing, a trained medical scribe. The creation of this record is based on the scribe's personal observations and the provider's statements to them. This document has been checked and approved by the attending provider.

## 2018-11-28 DIAGNOSIS — I5042 Chronic combined systolic (congestive) and diastolic (congestive) heart failure: Secondary | ICD-10-CM | POA: Diagnosis not present

## 2018-11-28 DIAGNOSIS — J441 Chronic obstructive pulmonary disease with (acute) exacerbation: Secondary | ICD-10-CM | POA: Diagnosis not present

## 2018-11-28 DIAGNOSIS — I2510 Atherosclerotic heart disease of native coronary artery without angina pectoris: Secondary | ICD-10-CM | POA: Diagnosis not present

## 2018-11-28 DIAGNOSIS — I110 Hypertensive heart disease with heart failure: Secondary | ICD-10-CM | POA: Diagnosis not present

## 2018-11-28 DIAGNOSIS — I739 Peripheral vascular disease, unspecified: Secondary | ICD-10-CM | POA: Diagnosis not present

## 2018-11-28 DIAGNOSIS — I255 Ischemic cardiomyopathy: Secondary | ICD-10-CM | POA: Diagnosis not present

## 2018-11-29 ENCOUNTER — Encounter: Payer: Self-pay | Admitting: Internal Medicine

## 2018-11-29 ENCOUNTER — Ambulatory Visit (HOSPITAL_COMMUNITY): Admit: 2018-11-29 | Payer: Medicare Other | Admitting: Neurosurgery

## 2018-11-29 ENCOUNTER — Encounter (HOSPITAL_COMMUNITY): Payer: Self-pay

## 2018-11-29 ENCOUNTER — Ambulatory Visit (INDEPENDENT_AMBULATORY_CARE_PROVIDER_SITE_OTHER): Payer: Medicare Other | Admitting: Internal Medicine

## 2018-11-29 ENCOUNTER — Other Ambulatory Visit (HOSPITAL_COMMUNITY): Payer: Self-pay | Admitting: Cardiology

## 2018-11-29 DIAGNOSIS — J9611 Chronic respiratory failure with hypoxia: Secondary | ICD-10-CM

## 2018-11-29 DIAGNOSIS — R911 Solitary pulmonary nodule: Secondary | ICD-10-CM | POA: Diagnosis not present

## 2018-11-29 DIAGNOSIS — G4733 Obstructive sleep apnea (adult) (pediatric): Secondary | ICD-10-CM

## 2018-11-29 DIAGNOSIS — I255 Ischemic cardiomyopathy: Secondary | ICD-10-CM

## 2018-11-29 DIAGNOSIS — Z9989 Dependence on other enabling machines and devices: Secondary | ICD-10-CM

## 2018-11-29 DIAGNOSIS — J449 Chronic obstructive pulmonary disease, unspecified: Secondary | ICD-10-CM | POA: Diagnosis not present

## 2018-11-29 SURGERY — BILATERAL PULSE GENERATOR IMPLANT
Anesthesia: General | Laterality: Bilateral

## 2018-11-29 MED ORDER — DIAZEPAM 5 MG PO TABS: ORAL_TABLET | ORAL | 5 refills | Status: DC

## 2018-11-29 NOTE — Patient Instructions (Signed)
Script printed refilling diazepam for tremor. Other meds are ok.  You can talk with [facility] about a portable O2 concentrator compared with your current portable system.  Ok to continue O2 at 2-3 L/min  Ok to continue CPAP 12, mask of choice, humidifier, supplies, AirView

## 2018-11-29 NOTE — Progress Notes (Signed)
HPI 72 year old male former smoker followed for dyspnea/COPD, lung nodules/ XRT, OSA complicated by CAD/CHF/MI/AICD/CM/AAA, AICD, morbid obesity DM 2 HBP, GERD, tremor/ deep brainstimulator NPSG 11/05/07- [place]- AHI 14.6/ hr, desaturation to 84%, body weight 279 lbs  -------------------------------------------------------------------  03/21/18- 72 year old male former smoker followed for dyspnea/COPD, lung nodule/ XRT, OSA complicated by CAD/CHF/MI/AICD/CM/AAA, morbid obesity,  DM 2 HBP, GERD CPAP 12 / [facility]  O2 2L -----OSA: DME: [facility]. Pt wears CPAP nightly and DL attached. Pt continues to use O2 as well.  Trelegy, albuterol hfa, neb albuterol Occasional unexplained weakness lasts about a day.  No change otherwise in his breathing and no acute events.  Radiation oncology is tracking another lung nodule-previous XRT without biopsy. He uses oxygen during the day with any activity, CPAP at night.  No cough or wheeze.  Nebulizer twice daily, Trelegy once daily. Very comfortable with CPAP and quite sure he feels better because of it.  Download compliance 100% AHI 1.0/hour. He asks okay to undergo placement of deep brain stimulator for his tremor, saying cardiology has already cleared him.  11/29/18- 72 year old male former smoker followed for dyspnea/COPD, lung nodules/ XRT, OSA complicated by CAD/CHF/MI/AICD/CM/AAA, morbid obesity,  DM 2 HBP, GERD, tremor/ deep brain stimulator CPAP 12 / [facility]  O2 2L Download 97% compliance AHI 1.3/hour -----Cpap is doing well and mask is fitting well. Having Sob but his base line.  Albuterol HFA, neb albuterol/ budesonide, Atrovent HFA, He starts new round of XRT for apparently progressive lung cancer at end of December. Deep brain stimulator has been postponed several times because of cancer status. He continues with CPAP and oxygen.  Using oxygen between 2.5 and 3 L. Valium helps some with tremor control. Little routine cough or wheeze but  activity is very limited. PET scan 10/25/2018-  IMPRESSION: 1. There is abnormal increased radiotracer uptake localizing to the progressive increased soft tissue within the right upper lobe suspicious for recurrent tumor. 2. Mild increased radiotracer uptake is noted localizing to the posterior left upper lobe nodular density with SUV max of 2.16, nonspecific. 3. Aortic Atherosclerosis (ICD10-I70.0) and Emphysema (ICD10-J43.9).  ROS-see HPI    + = positive Constitutional:  +  weight loss, night sweats, fevers, chills, fatigue, lassitude. HEENT:   No-  headaches, difficulty swallowing, tooth/dental problems, sore throat,       No-  sneezing, itching, ear ache, nasal congestion, post nasal drip,  CV:  No-   chest pain, +orthopnea, PND, +swelling in lower extremities, No-anasarca, dizziness, palpitations Resp: + shortness of breath with exertion or at rest.              No-   productive cough,  No non-productive cough,  No- coughing up of blood.              No-   change in color of mucus.  No- wheezing.   Skin: No-   rash or lesions. GI:  No-   heartburn, indigestion, abdominal pain, nausea, vomiting, GU: . MS:  + joint pain or swelling.  Neuro-     nothing unusual Psych:  No- change in mood or affect. No depression or anxiety.  No memory loss.  OBJ- Physical Exam   96%t saturation at rest on room air on arrival. +Wheelchair  General- Alert, Oriented, Affect-appropriate, Distress- none acute, + morbidly obese  Skin-+ ecchymoses on arms. Lymphadenopathy- none Head- atraumatic            Eyes- Gross vision intact, PERRLA, conjunctivae and secretions  clear            Ears- Hearing, canals-normal            Nose- clear, no-Septal dev, mucus, polyps, erosion, perforation             Throat- Mallampati III, mucosa clear , drainage- none, tonsils- atrophic Neck- flexible , trachea midline, no stridor , thyroid nl, carotid no bruit Chest - symmetrical excursion , unlabored            Heart/CV- RRR , no murmur , no gallop  , no rub, nl s1 s2                           - JVD- none , edema -none, stasis changes- none, varices- none           Lung-+unlabored shallow inspiratory effort, rales- none, wheeze-none, cough- none ,                         dullness-none, rub- none,            Chest wall- sternotomy scar,  Pacemaker defibrillator L Abd-  Br/ Gen/ Rectal- Not done, not indicated Extrem- cyanosis- none, clubbing, none, atrophy- none, strength- nl, vein donor scars Neuro- +head bob tremor

## 2018-12-01 DIAGNOSIS — J9611 Chronic respiratory failure with hypoxia: Secondary | ICD-10-CM | POA: Insufficient documentation

## 2018-12-01 NOTE — Assessment & Plan Note (Signed)
Remains dependent on oxygen at 2.5-3 L/min.

## 2018-12-01 NOTE — Assessment & Plan Note (Signed)
Exercise tolerance is poor for several reasons but he is not having active bronchitic symptoms and appears to be near baseline.

## 2018-12-01 NOTE — Assessment & Plan Note (Signed)
Continues to benefit from CPAP with improved sleep.  Wife confirms.  Download confirms. Plan-continue CPAP 12

## 2018-12-01 NOTE — Assessment & Plan Note (Signed)
Clinically this is behaving like a recurrent bronchogenic carcinoma after initial XRT.  No biopsy was done because of his poor clinical status originally.

## 2018-12-03 ENCOUNTER — Ambulatory Visit: Payer: Medicare Other | Admitting: Internal Medicine

## 2018-12-06 DIAGNOSIS — R911 Solitary pulmonary nodule: Secondary | ICD-10-CM | POA: Diagnosis not present

## 2018-12-06 DIAGNOSIS — R05 Cough: Secondary | ICD-10-CM | POA: Diagnosis not present

## 2018-12-06 DIAGNOSIS — R5383 Other fatigue: Secondary | ICD-10-CM | POA: Diagnosis not present

## 2018-12-06 DIAGNOSIS — Z79899 Other long term (current) drug therapy: Secondary | ICD-10-CM | POA: Diagnosis not present

## 2018-12-06 DIAGNOSIS — R0602 Shortness of breath: Secondary | ICD-10-CM | POA: Diagnosis not present

## 2018-12-06 DIAGNOSIS — Z510 Encounter for antineoplastic radiation therapy: Secondary | ICD-10-CM | POA: Diagnosis not present

## 2018-12-09 ENCOUNTER — Ambulatory Visit
Admission: RE | Admit: 2018-12-09 | Discharge: 2018-12-09 | Disposition: A | Discharge status: Home / Self Care | Payer: Medicare Other | Source: Ambulatory Visit | Attending: Radiation Oncology | Admitting: Radiation Oncology

## 2018-12-09 DIAGNOSIS — R5383 Other fatigue: Secondary | ICD-10-CM | POA: Diagnosis not present

## 2018-12-09 DIAGNOSIS — R0602 Shortness of breath: Secondary | ICD-10-CM | POA: Diagnosis not present

## 2018-12-09 DIAGNOSIS — R911 Solitary pulmonary nodule: Secondary | ICD-10-CM | POA: Diagnosis not present

## 2018-12-09 DIAGNOSIS — Z79899 Other long term (current) drug therapy: Secondary | ICD-10-CM | POA: Diagnosis not present

## 2018-12-09 DIAGNOSIS — R05 Cough: Secondary | ICD-10-CM | POA: Diagnosis not present

## 2018-12-09 DIAGNOSIS — Z510 Encounter for antineoplastic radiation therapy: Secondary | ICD-10-CM | POA: Diagnosis not present

## 2018-12-09 NOTE — Progress Notes (Signed)
  Radiation Oncology         [phone] ________________________________  Name: [name] MRN: [id number]  Date: 12/09/2018  DOB: 01/28/1946  Simulation Verification Note    ICD-10-CM   1. Nodule of right lung R91.1     Status: outpatient  NARRATIVE: The patient was brought to the treatment unit and placed in the planned treatment position. The clinical setup was verified. Then port films were obtained and uploaded to the radiation oncology medical record software.  The treatment beams were carefully compared against the planned radiation fields. The position location and shape of the radiation fields was reviewed. They targeted volume of tissue appears to be appropriately covered by the radiation beams. Organs at risk appear to be excluded as planned.  Based on my personal review, I approved the simulation verification. The patient's treatment will proceed as planned.  -----------------------------------  Blair Promise, PhD, MD

## 2018-12-10 ENCOUNTER — Ambulatory Visit
Admission: RE | Admit: 2018-12-10 | Discharge: 2018-12-10 | Disposition: A | Discharge status: Home / Self Care | Payer: Medicare Other | Source: Ambulatory Visit | Attending: Radiation Oncology | Admitting: Radiation Oncology

## 2018-12-10 DIAGNOSIS — R911 Solitary pulmonary nodule: Secondary | ICD-10-CM | POA: Diagnosis not present

## 2018-12-10 DIAGNOSIS — R5383 Other fatigue: Secondary | ICD-10-CM | POA: Diagnosis not present

## 2018-12-10 DIAGNOSIS — R0602 Shortness of breath: Secondary | ICD-10-CM | POA: Diagnosis not present

## 2018-12-10 DIAGNOSIS — R05 Cough: Secondary | ICD-10-CM | POA: Diagnosis not present

## 2018-12-10 DIAGNOSIS — Z510 Encounter for antineoplastic radiation therapy: Secondary | ICD-10-CM | POA: Diagnosis not present

## 2018-12-10 DIAGNOSIS — Z79899 Other long term (current) drug therapy: Secondary | ICD-10-CM | POA: Diagnosis not present

## 2018-12-10 MED ORDER — SONAFINE EX EMUL
1.0000 "application " | Freq: Two times a day (BID) | CUTANEOUS | Status: DC
  Administered 2018-12-10: 1 via TOPICAL

## 2018-12-12 ENCOUNTER — Other Ambulatory Visit (HOSPITAL_COMMUNITY): Payer: Self-pay

## 2018-12-12 ENCOUNTER — Ambulatory Visit
Admission: RE | Admit: 2018-12-12 | Discharge: 2018-12-12 | Disposition: A | Discharge status: Home / Self Care | Payer: Medicare Other | Source: Ambulatory Visit | Attending: Radiation Oncology | Admitting: Radiation Oncology

## 2018-12-12 ENCOUNTER — Ambulatory Visit: Payer: Medicare Other | Admitting: Radiation Oncology

## 2018-12-12 DIAGNOSIS — R0602 Shortness of breath: Secondary | ICD-10-CM | POA: Insufficient documentation

## 2018-12-12 DIAGNOSIS — Z510 Encounter for antineoplastic radiation therapy: Secondary | ICD-10-CM | POA: Insufficient documentation

## 2018-12-12 DIAGNOSIS — Z79899 Other long term (current) drug therapy: Secondary | ICD-10-CM | POA: Insufficient documentation

## 2018-12-12 DIAGNOSIS — Z7982 Long term (current) use of aspirin: Secondary | ICD-10-CM | POA: Insufficient documentation

## 2018-12-12 DIAGNOSIS — R911 Solitary pulmonary nodule: Secondary | ICD-10-CM | POA: Diagnosis not present

## 2018-12-12 DIAGNOSIS — R5383 Other fatigue: Secondary | ICD-10-CM | POA: Insufficient documentation

## 2018-12-12 DIAGNOSIS — R05 Cough: Secondary | ICD-10-CM | POA: Diagnosis not present

## 2018-12-13 ENCOUNTER — Ambulatory Visit
Admission: RE | Admit: 2018-12-13 | Discharge: 2018-12-13 | Disposition: A | Discharge status: Home / Self Care | Payer: Medicare Other | Source: Ambulatory Visit | Attending: Radiation Oncology | Admitting: Radiation Oncology

## 2018-12-13 DIAGNOSIS — R05 Cough: Secondary | ICD-10-CM | POA: Diagnosis not present

## 2018-12-13 DIAGNOSIS — Z510 Encounter for antineoplastic radiation therapy: Secondary | ICD-10-CM | POA: Diagnosis not present

## 2018-12-13 DIAGNOSIS — R911 Solitary pulmonary nodule: Secondary | ICD-10-CM | POA: Diagnosis not present

## 2018-12-13 DIAGNOSIS — R5383 Other fatigue: Secondary | ICD-10-CM | POA: Diagnosis not present

## 2018-12-13 DIAGNOSIS — R0602 Shortness of breath: Secondary | ICD-10-CM | POA: Diagnosis not present

## 2018-12-13 DIAGNOSIS — Z79899 Other long term (current) drug therapy: Secondary | ICD-10-CM | POA: Diagnosis not present

## 2018-12-16 ENCOUNTER — Other Ambulatory Visit (HOSPITAL_COMMUNITY): Payer: Self-pay

## 2018-12-16 ENCOUNTER — Ambulatory Visit
Admission: RE | Admit: 2018-12-16 | Discharge: 2018-12-16 | Disposition: A | Discharge status: Home / Self Care | Payer: Medicare Other | Source: Ambulatory Visit | Attending: Radiation Oncology | Admitting: Radiation Oncology

## 2018-12-16 DIAGNOSIS — R5383 Other fatigue: Secondary | ICD-10-CM | POA: Diagnosis not present

## 2018-12-16 DIAGNOSIS — Z510 Encounter for antineoplastic radiation therapy: Secondary | ICD-10-CM | POA: Diagnosis not present

## 2018-12-16 DIAGNOSIS — Z79899 Other long term (current) drug therapy: Secondary | ICD-10-CM | POA: Diagnosis not present

## 2018-12-16 DIAGNOSIS — R911 Solitary pulmonary nodule: Secondary | ICD-10-CM | POA: Diagnosis not present

## 2018-12-16 DIAGNOSIS — R05 Cough: Secondary | ICD-10-CM | POA: Diagnosis not present

## 2018-12-16 DIAGNOSIS — R0602 Shortness of breath: Secondary | ICD-10-CM | POA: Diagnosis not present

## 2018-12-17 ENCOUNTER — Ambulatory Visit
Admission: RE | Admit: 2018-12-17 | Discharge: 2018-12-17 | Disposition: A | Discharge status: Home / Self Care | Payer: Medicare Other | Source: Ambulatory Visit | Attending: Radiation Oncology | Admitting: Radiation Oncology

## 2018-12-17 DIAGNOSIS — Z79899 Other long term (current) drug therapy: Secondary | ICD-10-CM | POA: Diagnosis not present

## 2018-12-17 DIAGNOSIS — R05 Cough: Secondary | ICD-10-CM | POA: Diagnosis not present

## 2018-12-17 DIAGNOSIS — Z510 Encounter for antineoplastic radiation therapy: Secondary | ICD-10-CM | POA: Diagnosis not present

## 2018-12-17 DIAGNOSIS — R911 Solitary pulmonary nodule: Secondary | ICD-10-CM | POA: Diagnosis not present

## 2018-12-17 DIAGNOSIS — R0602 Shortness of breath: Secondary | ICD-10-CM | POA: Diagnosis not present

## 2018-12-17 DIAGNOSIS — R5383 Other fatigue: Secondary | ICD-10-CM | POA: Diagnosis not present

## 2018-12-18 ENCOUNTER — Ambulatory Visit
Admission: RE | Admit: 2018-12-18 | Discharge: 2018-12-18 | Disposition: A | Discharge status: Home / Self Care | Payer: Medicare Other | Source: Ambulatory Visit | Attending: Radiation Oncology | Admitting: Radiation Oncology

## 2018-12-18 DIAGNOSIS — R0602 Shortness of breath: Secondary | ICD-10-CM | POA: Diagnosis not present

## 2018-12-18 DIAGNOSIS — R911 Solitary pulmonary nodule: Secondary | ICD-10-CM | POA: Diagnosis not present

## 2018-12-18 DIAGNOSIS — Z510 Encounter for antineoplastic radiation therapy: Secondary | ICD-10-CM | POA: Diagnosis not present

## 2018-12-18 DIAGNOSIS — R5383 Other fatigue: Secondary | ICD-10-CM | POA: Diagnosis not present

## 2018-12-18 DIAGNOSIS — Z79899 Other long term (current) drug therapy: Secondary | ICD-10-CM | POA: Diagnosis not present

## 2018-12-18 DIAGNOSIS — R05 Cough: Secondary | ICD-10-CM | POA: Diagnosis not present

## 2018-12-19 ENCOUNTER — Ambulatory Visit
Admission: RE | Admit: 2018-12-19 | Discharge: 2018-12-19 | Disposition: A | Discharge status: Home / Self Care | Payer: Medicare Other | Source: Ambulatory Visit | Attending: Radiation Oncology | Admitting: Radiation Oncology

## 2018-12-19 DIAGNOSIS — R5383 Other fatigue: Secondary | ICD-10-CM | POA: Diagnosis not present

## 2018-12-19 DIAGNOSIS — Z79899 Other long term (current) drug therapy: Secondary | ICD-10-CM | POA: Diagnosis not present

## 2018-12-19 DIAGNOSIS — R911 Solitary pulmonary nodule: Secondary | ICD-10-CM | POA: Diagnosis not present

## 2018-12-19 DIAGNOSIS — Z510 Encounter for antineoplastic radiation therapy: Secondary | ICD-10-CM | POA: Diagnosis not present

## 2018-12-19 DIAGNOSIS — R05 Cough: Secondary | ICD-10-CM | POA: Diagnosis not present

## 2018-12-19 DIAGNOSIS — R0602 Shortness of breath: Secondary | ICD-10-CM | POA: Diagnosis not present

## 2018-12-20 ENCOUNTER — Ambulatory Visit
Admission: RE | Admit: 2018-12-20 | Discharge: 2018-12-20 | Disposition: A | Discharge status: Home / Self Care | Payer: Medicare Other | Source: Ambulatory Visit | Attending: Radiation Oncology | Admitting: Radiation Oncology

## 2018-12-20 DIAGNOSIS — R05 Cough: Secondary | ICD-10-CM | POA: Diagnosis not present

## 2018-12-20 DIAGNOSIS — R0602 Shortness of breath: Secondary | ICD-10-CM | POA: Diagnosis not present

## 2018-12-20 DIAGNOSIS — R5383 Other fatigue: Secondary | ICD-10-CM | POA: Diagnosis not present

## 2018-12-20 DIAGNOSIS — Z510 Encounter for antineoplastic radiation therapy: Secondary | ICD-10-CM | POA: Diagnosis not present

## 2018-12-20 DIAGNOSIS — R911 Solitary pulmonary nodule: Secondary | ICD-10-CM | POA: Diagnosis not present

## 2018-12-20 DIAGNOSIS — Z79899 Other long term (current) drug therapy: Secondary | ICD-10-CM | POA: Diagnosis not present

## 2018-12-23 ENCOUNTER — Ambulatory Visit
Admission: RE | Admit: 2018-12-23 | Discharge: 2018-12-23 | Disposition: A | Discharge status: Home / Self Care | Payer: Medicare Other | Source: Ambulatory Visit | Attending: Radiation Oncology | Admitting: Radiation Oncology

## 2018-12-23 DIAGNOSIS — R05 Cough: Secondary | ICD-10-CM | POA: Diagnosis not present

## 2018-12-23 DIAGNOSIS — R911 Solitary pulmonary nodule: Secondary | ICD-10-CM | POA: Diagnosis not present

## 2018-12-23 DIAGNOSIS — Z510 Encounter for antineoplastic radiation therapy: Secondary | ICD-10-CM | POA: Diagnosis not present

## 2018-12-23 DIAGNOSIS — R5383 Other fatigue: Secondary | ICD-10-CM | POA: Diagnosis not present

## 2018-12-23 DIAGNOSIS — N471 Phimosis: Secondary | ICD-10-CM | POA: Diagnosis not present

## 2018-12-23 DIAGNOSIS — Z79899 Other long term (current) drug therapy: Secondary | ICD-10-CM | POA: Diagnosis not present

## 2018-12-23 DIAGNOSIS — R351 Nocturia: Secondary | ICD-10-CM | POA: Diagnosis not present

## 2018-12-23 DIAGNOSIS — N401 Enlarged prostate with lower urinary tract symptoms: Secondary | ICD-10-CM | POA: Diagnosis not present

## 2018-12-23 DIAGNOSIS — R0602 Shortness of breath: Secondary | ICD-10-CM | POA: Diagnosis not present

## 2018-12-24 ENCOUNTER — Ambulatory Visit
Admission: RE | Admit: 2018-12-24 | Discharge: 2018-12-24 | Disposition: A | Discharge status: Home / Self Care | Payer: Medicare Other | Source: Ambulatory Visit | Attending: Radiation Oncology | Admitting: Radiation Oncology

## 2018-12-24 DIAGNOSIS — R911 Solitary pulmonary nodule: Secondary | ICD-10-CM | POA: Diagnosis not present

## 2018-12-24 DIAGNOSIS — R0602 Shortness of breath: Secondary | ICD-10-CM | POA: Diagnosis not present

## 2018-12-24 DIAGNOSIS — Z79899 Other long term (current) drug therapy: Secondary | ICD-10-CM | POA: Diagnosis not present

## 2018-12-24 DIAGNOSIS — R5383 Other fatigue: Secondary | ICD-10-CM | POA: Diagnosis not present

## 2018-12-24 DIAGNOSIS — R05 Cough: Secondary | ICD-10-CM | POA: Diagnosis not present

## 2018-12-24 DIAGNOSIS — Z510 Encounter for antineoplastic radiation therapy: Secondary | ICD-10-CM | POA: Diagnosis not present

## 2018-12-25 ENCOUNTER — Ambulatory Visit
Admission: RE | Admit: 2018-12-25 | Discharge: 2018-12-25 | Disposition: A | Discharge status: Home / Self Care | Payer: Medicare Other | Source: Ambulatory Visit | Attending: Radiation Oncology | Admitting: Radiation Oncology

## 2018-12-25 DIAGNOSIS — Z79899 Other long term (current) drug therapy: Secondary | ICD-10-CM | POA: Diagnosis not present

## 2018-12-25 DIAGNOSIS — R911 Solitary pulmonary nodule: Secondary | ICD-10-CM | POA: Diagnosis not present

## 2018-12-25 DIAGNOSIS — R0602 Shortness of breath: Secondary | ICD-10-CM | POA: Diagnosis not present

## 2018-12-25 DIAGNOSIS — R5383 Other fatigue: Secondary | ICD-10-CM | POA: Diagnosis not present

## 2018-12-25 DIAGNOSIS — Z510 Encounter for antineoplastic radiation therapy: Secondary | ICD-10-CM | POA: Diagnosis not present

## 2018-12-25 DIAGNOSIS — R05 Cough: Secondary | ICD-10-CM | POA: Diagnosis not present

## 2018-12-26 ENCOUNTER — Ambulatory Visit
Admission: RE | Admit: 2018-12-26 | Discharge: 2018-12-26 | Disposition: A | Discharge status: Home / Self Care | Payer: Medicare Other | Source: Ambulatory Visit | Attending: Radiation Oncology | Admitting: Radiation Oncology

## 2018-12-26 DIAGNOSIS — R911 Solitary pulmonary nodule: Secondary | ICD-10-CM | POA: Diagnosis not present

## 2018-12-26 DIAGNOSIS — Z510 Encounter for antineoplastic radiation therapy: Secondary | ICD-10-CM | POA: Diagnosis not present

## 2018-12-26 DIAGNOSIS — R5383 Other fatigue: Secondary | ICD-10-CM | POA: Diagnosis not present

## 2018-12-26 DIAGNOSIS — R05 Cough: Secondary | ICD-10-CM | POA: Diagnosis not present

## 2018-12-26 DIAGNOSIS — R0602 Shortness of breath: Secondary | ICD-10-CM | POA: Diagnosis not present

## 2018-12-26 DIAGNOSIS — Z79899 Other long term (current) drug therapy: Secondary | ICD-10-CM | POA: Diagnosis not present

## 2018-12-27 ENCOUNTER — Ambulatory Visit
Admission: RE | Admit: 2018-12-27 | Discharge: 2018-12-27 | Disposition: A | Discharge status: Home / Self Care | Payer: Medicare Other | Source: Ambulatory Visit | Attending: Radiation Oncology | Admitting: Radiation Oncology

## 2018-12-27 DIAGNOSIS — R911 Solitary pulmonary nodule: Secondary | ICD-10-CM | POA: Diagnosis not present

## 2018-12-27 DIAGNOSIS — R05 Cough: Secondary | ICD-10-CM | POA: Diagnosis not present

## 2018-12-27 DIAGNOSIS — R0602 Shortness of breath: Secondary | ICD-10-CM | POA: Diagnosis not present

## 2018-12-27 DIAGNOSIS — Z510 Encounter for antineoplastic radiation therapy: Secondary | ICD-10-CM | POA: Diagnosis not present

## 2018-12-27 DIAGNOSIS — R5383 Other fatigue: Secondary | ICD-10-CM | POA: Diagnosis not present

## 2018-12-27 DIAGNOSIS — Z79899 Other long term (current) drug therapy: Secondary | ICD-10-CM | POA: Diagnosis not present

## 2018-12-30 ENCOUNTER — Ambulatory Visit
Admission: RE | Admit: 2018-12-30 | Discharge: 2018-12-30 | Disposition: A | Discharge status: Home / Self Care | Payer: Medicare Other | Source: Ambulatory Visit | Attending: Radiation Oncology | Admitting: Radiation Oncology

## 2018-12-30 ENCOUNTER — Ambulatory Visit: Payer: Medicare Other | Admitting: Neurology

## 2018-12-30 DIAGNOSIS — R5383 Other fatigue: Secondary | ICD-10-CM | POA: Diagnosis not present

## 2018-12-30 DIAGNOSIS — Z510 Encounter for antineoplastic radiation therapy: Secondary | ICD-10-CM | POA: Diagnosis not present

## 2018-12-30 DIAGNOSIS — Z79899 Other long term (current) drug therapy: Secondary | ICD-10-CM | POA: Diagnosis not present

## 2018-12-30 DIAGNOSIS — R05 Cough: Secondary | ICD-10-CM | POA: Diagnosis not present

## 2018-12-30 DIAGNOSIS — R911 Solitary pulmonary nodule: Secondary | ICD-10-CM | POA: Diagnosis not present

## 2018-12-30 DIAGNOSIS — R0602 Shortness of breath: Secondary | ICD-10-CM | POA: Diagnosis not present

## 2018-12-31 ENCOUNTER — Ambulatory Visit
Admission: RE | Admit: 2018-12-31 | Discharge: 2018-12-31 | Disposition: A | Discharge status: Home / Self Care | Payer: Medicare Other | Source: Ambulatory Visit | Attending: Radiation Oncology | Admitting: Radiation Oncology

## 2018-12-31 DIAGNOSIS — R911 Solitary pulmonary nodule: Secondary | ICD-10-CM | POA: Diagnosis not present

## 2018-12-31 DIAGNOSIS — Z79899 Other long term (current) drug therapy: Secondary | ICD-10-CM | POA: Diagnosis not present

## 2018-12-31 DIAGNOSIS — R0602 Shortness of breath: Secondary | ICD-10-CM | POA: Diagnosis not present

## 2018-12-31 DIAGNOSIS — Z510 Encounter for antineoplastic radiation therapy: Secondary | ICD-10-CM | POA: Diagnosis not present

## 2018-12-31 DIAGNOSIS — R05 Cough: Secondary | ICD-10-CM | POA: Diagnosis not present

## 2018-12-31 DIAGNOSIS — R5383 Other fatigue: Secondary | ICD-10-CM | POA: Diagnosis not present

## 2019-01-01 ENCOUNTER — Ambulatory Visit
Admission: RE | Admit: 2019-01-01 | Discharge: 2019-01-01 | Disposition: A | Discharge status: Home / Self Care | Payer: Medicare Other | Source: Ambulatory Visit | Attending: Radiation Oncology | Admitting: Radiation Oncology

## 2019-01-01 DIAGNOSIS — Z510 Encounter for antineoplastic radiation therapy: Secondary | ICD-10-CM | POA: Diagnosis not present

## 2019-01-01 DIAGNOSIS — R05 Cough: Secondary | ICD-10-CM | POA: Diagnosis not present

## 2019-01-01 DIAGNOSIS — R0602 Shortness of breath: Secondary | ICD-10-CM | POA: Diagnosis not present

## 2019-01-01 DIAGNOSIS — R5383 Other fatigue: Secondary | ICD-10-CM | POA: Diagnosis not present

## 2019-01-01 DIAGNOSIS — Z79899 Other long term (current) drug therapy: Secondary | ICD-10-CM | POA: Diagnosis not present

## 2019-01-01 DIAGNOSIS — R911 Solitary pulmonary nodule: Secondary | ICD-10-CM | POA: Diagnosis not present

## 2019-01-02 ENCOUNTER — Ambulatory Visit
Admission: RE | Admit: 2019-01-02 | Discharge: 2019-01-02 | Disposition: A | Discharge status: Home / Self Care | Payer: Medicare Other | Source: Ambulatory Visit | Attending: Radiation Oncology | Admitting: Radiation Oncology

## 2019-01-02 DIAGNOSIS — R0602 Shortness of breath: Secondary | ICD-10-CM | POA: Diagnosis not present

## 2019-01-02 DIAGNOSIS — R05 Cough: Secondary | ICD-10-CM | POA: Diagnosis not present

## 2019-01-02 DIAGNOSIS — R5383 Other fatigue: Secondary | ICD-10-CM | POA: Diagnosis not present

## 2019-01-02 DIAGNOSIS — Z510 Encounter for antineoplastic radiation therapy: Secondary | ICD-10-CM | POA: Diagnosis not present

## 2019-01-02 DIAGNOSIS — Z79899 Other long term (current) drug therapy: Secondary | ICD-10-CM | POA: Diagnosis not present

## 2019-01-02 DIAGNOSIS — R911 Solitary pulmonary nodule: Secondary | ICD-10-CM | POA: Diagnosis not present

## 2019-01-03 ENCOUNTER — Telehealth: Payer: Self-pay | Admitting: Cardiovascular Disease

## 2019-01-03 ENCOUNTER — Ambulatory Visit
Admission: RE | Admit: 2019-01-03 | Discharge: 2019-01-03 | Disposition: A | Discharge status: Home / Self Care | Payer: Medicare Other | Source: Ambulatory Visit | Attending: Radiation Oncology | Admitting: Radiation Oncology

## 2019-01-03 DIAGNOSIS — R0602 Shortness of breath: Secondary | ICD-10-CM | POA: Diagnosis not present

## 2019-01-03 DIAGNOSIS — R911 Solitary pulmonary nodule: Secondary | ICD-10-CM | POA: Diagnosis not present

## 2019-01-03 DIAGNOSIS — R5383 Other fatigue: Secondary | ICD-10-CM | POA: Diagnosis not present

## 2019-01-03 DIAGNOSIS — R05 Cough: Secondary | ICD-10-CM | POA: Diagnosis not present

## 2019-01-03 DIAGNOSIS — Z79899 Other long term (current) drug therapy: Secondary | ICD-10-CM | POA: Diagnosis not present

## 2019-01-03 DIAGNOSIS — Z510 Encounter for antineoplastic radiation therapy: Secondary | ICD-10-CM | POA: Diagnosis not present

## 2019-01-03 NOTE — Telephone Encounter (Signed)
Error

## 2019-01-04 LAB — CUP PACEART REMOTE DEVICE CHECK
Battery Remaining Longevity: 72 mo
Battery Remaining Percentage: 100 %
Brady Statistic RA Percent Paced: 0 %
Brady Statistic RV Percent Paced: 92 %
Date Time Interrogation Session: 20191210074000
HighPow Impedance: 49 Ohm
Implantable Lead Implant Date: 20060411
Implantable Lead Implant Date: 20151030
Implantable Lead Implant Date: 20151030
Implantable Lead Location: 753858
Implantable Lead Location: 753859
Implantable Lead Location: 753860
Implantable Lead Model: 148
Implantable Lead Model: 4136
Implantable Lead Serial Number: 145070
Implantable Lead Serial Number: 29713415
Implantable Pulse Generator Implant Date: 20151030
Lead Channel Impedance Value: 592 Ohm
Lead Channel Impedance Value: 614 Ohm
Lead Channel Impedance Value: 645 Ohm
Lead Channel Pacing Threshold Amplitude: 0.6 V
Lead Channel Pacing Threshold Amplitude: 0.6 V
Lead Channel Pacing Threshold Amplitude: 1.9 V
Lead Channel Pacing Threshold Pulse Width: 0.4 ms
Lead Channel Pacing Threshold Pulse Width: 0.4 ms
Lead Channel Pacing Threshold Pulse Width: 0.9 ms
Lead Channel Setting Pacing Amplitude: 2 V
Lead Channel Setting Pacing Amplitude: 2 V
Lead Channel Setting Pacing Amplitude: 3.5 V
Lead Channel Setting Pacing Pulse Width: 0.4 ms
Lead Channel Setting Pacing Pulse Width: 0.9 ms
Lead Channel Setting Sensing Sensitivity: 0.6 mV
Lead Channel Setting Sensing Sensitivity: 1 mV
Pulse Gen Serial Number: 370874

## 2019-01-06 ENCOUNTER — Ambulatory Visit (HOSPITAL_COMMUNITY)
Admission: RE | Admit: 2019-01-06 | Discharge: 2019-01-06 | Disposition: A | Discharge status: Home / Self Care | Payer: Medicare Other | Source: Ambulatory Visit | Attending: Cardiology | Admitting: Cardiology

## 2019-01-06 ENCOUNTER — Ambulatory Visit: Payer: Medicare Other | Admitting: Neurology

## 2019-01-06 ENCOUNTER — Ambulatory Visit
Admission: RE | Admit: 2019-01-06 | Discharge: 2019-01-06 | Disposition: A | Discharge status: Home / Self Care | Payer: Medicare Other | Source: Ambulatory Visit | Attending: Radiation Oncology | Admitting: Radiation Oncology

## 2019-01-06 ENCOUNTER — Telehealth (HOSPITAL_COMMUNITY): Payer: Self-pay

## 2019-01-06 ENCOUNTER — Encounter (HOSPITAL_COMMUNITY): Payer: Self-pay | Admitting: Cardiology

## 2019-01-06 ENCOUNTER — Other Ambulatory Visit: Payer: Self-pay

## 2019-01-06 VITALS — BP 126/74 | HR 75 | Wt 264.1 lb

## 2019-01-06 DIAGNOSIS — E1151 Type 2 diabetes mellitus with diabetic peripheral angiopathy without gangrene: Secondary | ICD-10-CM | POA: Diagnosis not present

## 2019-01-06 DIAGNOSIS — N400 Enlarged prostate without lower urinary tract symptoms: Secondary | ICD-10-CM | POA: Diagnosis not present

## 2019-01-06 DIAGNOSIS — Z833 Family history of diabetes mellitus: Secondary | ICD-10-CM | POA: Insufficient documentation

## 2019-01-06 DIAGNOSIS — I255 Ischemic cardiomyopathy: Secondary | ICD-10-CM | POA: Diagnosis not present

## 2019-01-06 DIAGNOSIS — I739 Peripheral vascular disease, unspecified: Secondary | ICD-10-CM

## 2019-01-06 DIAGNOSIS — I2510 Atherosclerotic heart disease of native coronary artery without angina pectoris: Secondary | ICD-10-CM | POA: Diagnosis not present

## 2019-01-06 DIAGNOSIS — E039 Hypothyroidism, unspecified: Secondary | ICD-10-CM | POA: Insufficient documentation

## 2019-01-06 DIAGNOSIS — R251 Tremor, unspecified: Secondary | ICD-10-CM | POA: Diagnosis not present

## 2019-01-06 DIAGNOSIS — Z951 Presence of aortocoronary bypass graft: Secondary | ICD-10-CM | POA: Diagnosis not present

## 2019-01-06 DIAGNOSIS — Z9981 Dependence on supplemental oxygen: Secondary | ICD-10-CM | POA: Insufficient documentation

## 2019-01-06 DIAGNOSIS — G4733 Obstructive sleep apnea (adult) (pediatric): Secondary | ICD-10-CM | POA: Insufficient documentation

## 2019-01-06 DIAGNOSIS — K219 Gastro-esophageal reflux disease without esophagitis: Secondary | ICD-10-CM | POA: Diagnosis not present

## 2019-01-06 DIAGNOSIS — Z95810 Presence of automatic (implantable) cardiac defibrillator: Secondary | ICD-10-CM | POA: Diagnosis not present

## 2019-01-06 DIAGNOSIS — Z7901 Long term (current) use of anticoagulants: Secondary | ICD-10-CM | POA: Insufficient documentation

## 2019-01-06 DIAGNOSIS — J449 Chronic obstructive pulmonary disease, unspecified: Secondary | ICD-10-CM | POA: Diagnosis not present

## 2019-01-06 DIAGNOSIS — C3490 Malignant neoplasm of unspecified part of unspecified bronchus or lung: Secondary | ICD-10-CM | POA: Diagnosis not present

## 2019-01-06 DIAGNOSIS — Z510 Encounter for antineoplastic radiation therapy: Secondary | ICD-10-CM | POA: Diagnosis not present

## 2019-01-06 DIAGNOSIS — Z79890 Hormone replacement therapy (postmenopausal): Secondary | ICD-10-CM | POA: Diagnosis not present

## 2019-01-06 DIAGNOSIS — Z86010 Personal history of colonic polyps: Secondary | ICD-10-CM | POA: Insufficient documentation

## 2019-01-06 DIAGNOSIS — Z7982 Long term (current) use of aspirin: Secondary | ICD-10-CM | POA: Diagnosis not present

## 2019-01-06 DIAGNOSIS — I5042 Chronic combined systolic (congestive) and diastolic (congestive) heart failure: Secondary | ICD-10-CM

## 2019-01-06 DIAGNOSIS — I472 Ventricular tachycardia, unspecified: Secondary | ICD-10-CM

## 2019-01-06 DIAGNOSIS — R0602 Shortness of breath: Secondary | ICD-10-CM | POA: Diagnosis not present

## 2019-01-06 DIAGNOSIS — E785 Hyperlipidemia, unspecified: Secondary | ICD-10-CM | POA: Insufficient documentation

## 2019-01-06 DIAGNOSIS — R911 Solitary pulmonary nodule: Secondary | ICD-10-CM | POA: Diagnosis not present

## 2019-01-06 DIAGNOSIS — I110 Hypertensive heart disease with heart failure: Secondary | ICD-10-CM | POA: Insufficient documentation

## 2019-01-06 DIAGNOSIS — Z8249 Family history of ischemic heart disease and other diseases of the circulatory system: Secondary | ICD-10-CM | POA: Insufficient documentation

## 2019-01-06 DIAGNOSIS — Z7902 Long term (current) use of antithrombotics/antiplatelets: Secondary | ICD-10-CM | POA: Diagnosis not present

## 2019-01-06 DIAGNOSIS — Z79899 Other long term (current) drug therapy: Secondary | ICD-10-CM | POA: Diagnosis not present

## 2019-01-06 DIAGNOSIS — Z801 Family history of malignant neoplasm of trachea, bronchus and lung: Secondary | ICD-10-CM | POA: Insufficient documentation

## 2019-01-06 DIAGNOSIS — I252 Old myocardial infarction: Secondary | ICD-10-CM | POA: Insufficient documentation

## 2019-01-06 DIAGNOSIS — Z87891 Personal history of nicotine dependence: Secondary | ICD-10-CM | POA: Insufficient documentation

## 2019-01-06 DIAGNOSIS — I5022 Chronic systolic (congestive) heart failure: Secondary | ICD-10-CM | POA: Diagnosis not present

## 2019-01-06 DIAGNOSIS — Z800 Family history of malignant neoplasm of digestive organs: Secondary | ICD-10-CM | POA: Insufficient documentation

## 2019-01-06 DIAGNOSIS — R05 Cough: Secondary | ICD-10-CM | POA: Diagnosis not present

## 2019-01-06 DIAGNOSIS — R5383 Other fatigue: Secondary | ICD-10-CM | POA: Diagnosis not present

## 2019-01-06 LAB — COMPREHENSIVE METABOLIC PANEL
ALT: 14 U/L (ref 0–44)
AST: 14 U/L — ABNORMAL LOW (ref 15–41)
Albumin: 3.7 g/dL (ref 3.5–5.0)
Alkaline Phosphatase: 65 U/L (ref 38–126)
Anion gap: 10 (ref 5–15)
BUN: 24 mg/dL — ABNORMAL HIGH (ref 8–23)
CO2: 25 mmol/L (ref 22–32)
Calcium: 8.9 mg/dL (ref 8.9–10.3)
Chloride: 103 mmol/L (ref 98–111)
Creatinine, Ser: 1.12 mg/dL (ref 0.61–1.24)
GFR calc Af Amer: >60 mL/min (ref >60)
GFR calc non Af Amer: >60 mL/min (ref >60)
Glucose, Bld: 107 mg/dL — ABNORMAL HIGH (ref 70–99)
Potassium: 4.2 mmol/L (ref 3.5–5.1)
Sodium: 138 mmol/L (ref 135–145)
Total Bilirubin: 0.9 mg/dL (ref 0.3–1.2)
Total Protein: 6.3 g/dL — ABNORMAL LOW (ref 6.5–8.1)

## 2019-01-06 LAB — LIPID PANEL
Cholesterol: 148 mg/dL (ref 0–200)
HDL: 32 mg/dL — ABNORMAL LOW (ref >40)
LDL Cholesterol: 80 mg/dL (ref 0–99)
Total CHOL/HDL Ratio: 4.6 RATIO
Triglycerides: 182 mg/dL — ABNORMAL HIGH (ref <150)
VLDL: 36 mg/dL (ref 0–40)

## 2019-01-06 LAB — TSH: TSH: 3.91 u[IU]/mL (ref 0.350–4.500)

## 2019-01-06 MED ORDER — ATORVASTATIN CALCIUM 80 MG PO TABS: 80.0000 mg | ORAL_TABLET | Freq: Every day | ORAL | 6 refills | Status: DC

## 2019-01-06 MED ORDER — RIVAROXABAN 2.5 MG PO TABS: 2.5000 mg | ORAL_TABLET | Freq: Two times a day (BID) | ORAL | 3 refills | Status: DC

## 2019-01-06 NOTE — Telephone Encounter (Signed)
Spoke with patients wife, aware of LDL results. Per MD, plan to increase atorvastatin to 80mg  daily. Will repeat labs in 2 months, appointment made. States understanding of all.

## 2019-01-06 NOTE — Telephone Encounter (Signed)
-----   Message from Larey Dresser, MD sent at 01/06/2019  2:33 PM EST ----- LDL is a little high, increase atorvastatin to 80 mg daily. Lipids/LFTs in 2 months.

## 2019-01-06 NOTE — Progress Notes (Signed)
Patient ID: [name], male   DOB: [birth year], 73 y.o.   MRN: [id number] [facility]  01/06/2019 [name]   05-Oct-1946  [id number]  Primary Physicia Tyrone Lima, MD Hematologist: Dr [name]  Pulmonary: Dr [name] Cardiology: Dr. Aundra Small  [name] is a pleasant 73 y.o. with a past medical history significant for morbid obesity, systolic heart failure with an EF of 30-35% (January 2014) -> 20-25% (March 2015 - RV normal), VT on amio, CAD s/p CABG 1996, LBBB, COPD, obstructive sleep apnea on CPAP, AAA repair (January 2015) who was admitted to [facility] on 02/24/14 for acute on chronic HF and acute COPD exacerbation. He also had iron deficiency anemia.   Admitted to [facility] July 24 through July 06 2014 with chest pain.  CEs negative. Had a LHC with no change from previous LHC with recommendations to continue medical management. Discharge weight was 255 pounds.   LHC 07/06/14 --No significant change from previous studies.  Left anterior descending (LAD): The LAD is a large vessel. There is a 40% stenosis immediately after the takeoff of a large septal perforator. There are 2 large diagonal branches without significant disease. Left circumflex (LCx): The LCx is occluded proximally. There are left to left collaterals.  Right coronary artery (RCA): The RCA is occluded proximally immediately following the conus branch. There are right to right and left to right collaterals. SVGs from CABG known to be totally occluded.   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   CTA chest/abd/pelvis (3/16) with moderate emphysema, 1.3 cm nodule RUL, left SFA totally occluded, right SFA with significant stenosis, s/p endovascular AAA repair (stable).   Echo (5/17) with EF 20-25%, severe LV dilation, mild MR, normal RV size with mildly decreased systolic function.   He has a RUL nodule that is most likely lung cancer, he is being treated with radiation.   Echo (8/18) with EF 30%, basal-mid inferior  and inferolateral AK, severe LV dilation, normal RV, mild MR.   RHC/LHC was done in 1/19 due to worsening exertional dyspnea.  This showed normal filling pressures and preserved cardiac output.  There was 30-40% mLAD stenosis.  The LAD provided collaterals to the LCx and RCA territories.  The LCx, RCA, SVG-OM, and SVG-PDA were all chronically occluded (known from the past).  No new disease.   [facility] in 2/19 at [facility] with 0.78 on right, 0.59 on left => left iliac > 50% stenosis.  11/19 ABIs 0.78 right, 0.59 left (stable).   He saw neurology about his tremor.  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  However, he is on amiodarone which may be contributing.  He has been seeing Dr. Vertell Small with plan for deep brain stimulator, on hold currently due to cancer treatment.   He has completed chest radiation for RLL lung cancer.  However, CT showed new RUL lesion in 11/19 and he restart XRT in 12/19.   He returns today for followup of CHF and CAD.  He is using oxygen with exertion and CPAP at night.  He has leg cramps at night but no claudication walking around. He walks slowly, short of breath after about 30 yards. No chest pain.  No orthopnea/PND. No lightheadedness or falls. Weight is down 8 lbs.   Labs 3/15 Cr 1.5 K 5.6 Labs 07/06/14 K 3.9 Creatinine  0.98 Labs 9/15 K 5.1, creatinine 0.96 Labs 11/15 K 3.8, creatinine 0.83, LDL 61, LFTs normal, TSH normal Labs 11/26/14 K 5.0 Creatinine 0.73 , LFTs  normal, TSH normal,  Labs 12/08/14 K 3.4 Creatinine 0.83  Labs 3/16 K 4.4, creatinine 1.03, LFTs normal, TSH normal, HCT 37.8, BNP 65 Labs 6/16 K 4.1, creatinine 0.93, TSH normal, LFTs normal Labs 11/16 K 5.1, creatinine 1.13, LFTs normal, TSH normal, LDL 83, HDL 33, TGs 162 Labs 1/17 K 4.4, creatinine 0.86, pro-BNP 154 Labs 2/17 BNP 113, K 3.8, creatinine 0.79, TSH normal, LFTs normal Labs 4/17 K 3.8, creatinine 0.91, HCT 48.1 Labs 6/17 K 3.5, creatinine 0.98 Labs 9/17 K 3.9, creatinine 0.79, LDL 69, HDL 29,  TSH normal, LFTs normal Labs 3/18 K 3.6, creatinine 0.87 Labs 8/18 K 3.3 => 4.2, creatinine 0.78 => 1.04, LDL 66, HDL 38, BNP 166 Labs 9/18: K 4.7, creatinine 0.9, BNP 144 Labs 11/18: K 4.8, creatinine 1.05, TSH normal, LFTs normal.  Labs 1/19: K 4.4, creatinine 1.08, LFTs normal Labs 5/19: LDL 60, HDL 28, TSH normal, LFTs normal, K 4.6, creatinine 1.43 Labs 11/19: K 4.6, creatinine 1.42, LFTs normal, TSH normal, Mg 2.3  ROS: All systems reviewed and negative except as per HPI.   Past Medical History:  Diagnosis Date  . AAA (abdominal aortic aneurysm) ([place])    followed by Dr. Bridgett Small  . Adenomatous colon polyp 01/2004  . Anemia    hx  . Automatic implantable cardioverter-defibrillator in situ   . AVM (arteriovenous malformation)   . BPH (benign prostatic hypertrophy)   . CAD (coronary artery disease)    s/p CABGx2 in 1996  . Carotid artery stenosis    LCEA - Dr. Bridgett Small in 2013  . CHF (congestive heart failure) ([place])   . Complication of anesthesia    claustrophobic, unabe to lie on back more than 4 hours at time due to back  . COPD (chronic obstructive pulmonary disease) ([place])   . Diabetes mellitus    DIET CONTROLLED- pt states that this was a misdiagnosis, he was treated while in the hospital  but returned home, loss a massive amount of weight and he has not had a problem with his blood sugar since. States everything has been normal for about 3 years.  . Diverticulosis   . Dyspnea   . Dysrhythmia    ICD-defibrillator  . Fatigue   . GERD (gastroesophageal reflux disease)   . H/O hiatal hernia   . History of radiation therapy 04/09/17-04/17/17   SBRT right lung 54 Gy in 3 fractions  . HLD (hyperlipidemia)   . Hypertension   . Hypothyroidism   . Ischemic cardiomyopathy   . Myocardial infarction ([place])   . NSCL ca   . OSA on CPAP    AHI durign total sleep 14.69/hr, during REM 50.91/hr  . Peripheral vascular disease ([facility])    LCEA, L renal artery stent, bilat iliac stents, R SFA  stenosis  . Tremor   . Ventricular tachycardia ([place]) 09/09/2014   Amiodarone was started after appropriate defibrillator shocks for ventricular tachycardia in October 2008    Current Outpatient Medications  Medication Sig Dispense Refill  . albuterol (PROVENTIL HFA;VENTOLIN HFA) 108 (90 BASE) MCG/ACT inhaler Inhale 2 puffs into the lungs every 6 (six) hours as needed for wheezing or shortness of breath. 3 Inhaler 4  . albuterol (PROVENTIL) (2.5 MG/3ML) 0.083% nebulizer solution Take 3 mLs (2.5 mg total) by nebulization every 6 (six) hours. 120 mL 3  . amiodarone (PACERONE) 200 MG tablet Take 0.5 tablets (100 mg total) by mouth daily. 45 tablet 3  . arformoterol (BROVANA) 15 MCG/2ML NEBU Take 2 mLs (15  mcg total) by nebulization 2 (two) times daily. 120 mL 5  . aspirin 81 MG tablet Take 81 mg by mouth daily.    . ATROVENT HFA 17 MCG/ACT inhaler TAKE 2 PUFFS BY MOUTH EVERY 4 HOURS AS NEEDED FOR WHEEZE  12  . budesonide (PULMICORT) 0.5 MG/2ML nebulizer solution Take 2 mLs (0.5 mg total) by nebulization 2 (two) times daily. 120 mL 5  . carvedilol (COREG) 25 MG tablet TAKE 1 TABLET BY MOUTH  TWICE A DAY WITH MEALS 180 tablet 3  . diazepam (VALIUM) 5 MG tablet 1-2 tabs every 8 hours if needed for tremor 30 tablet 5  . ENTRESTO 49-51 MG TAKE 1 TABLET BY MOUTH TWICE A DAY 60 tablet 6  . fluticasone (FLONASE) 50 MCG/ACT nasal spray Instill 1 spray in each  nostril daily 32 g 3  . Glycopyrrolate (LONHALA MAGNAIR REFILL KIT) 25 MCG/ML SOLN Inhale 1 Act into the lungs 2 (two) times daily. 60 mL 11  . isosorbide mononitrate (IMDUR) 60 MG 24 hr tablet TAKE 1 TABLET BY MOUTH  DAILY 90 tablet 2  . KLOR-CON M20 20 MEQ tablet TAKE 1 TABLET BY MOUTH EVERY DAY 90 tablet 2  . levothyroxine (SYNTHROID, LEVOTHROID) 50 MCG tablet TAKE 1 TABLET BY MOUTH  DAILY BEFORE BREAKFAST 90 tablet 0  . nitroGLYCERIN (NITROSTAT) 0.4 MG SL tablet DISSOLVE 1 TABLET UNDER THE TONGUE EVERY 5 MINUTES AS  NEEDED FOR CHEST PAIN , MAX 3  TABLETS IN 15 MIN, CALL  911 IF CHEST PAIN PERSISTS 25 tablet 0  . NON FORMULARY at bedtime. CPAP And during the day as needed    . spironolactone (ALDACTONE) 25 MG tablet TAKE 1 TABLET BY MOUTH  DAILY 90 tablet 3  . torsemide (DEMADEX) 20 MG tablet Take 4 tablets (80 mg total) by mouth daily. 180 tablet 3  . atorvastatin (LIPITOR) 80 MG tablet Take 1 tablet (80 mg total) by mouth daily. 30 tablet 6  . rivaroxaban (XARELTO) 2.5 MG TABS tablet Take 1 tablet (2.5 mg total) by mouth 2 (two) times daily. 60 tablet 3   No current facility-administered medications for this encounter.     Allergies  Allergen Reactions  . Tussionex Pennkinetic Er [Hydrocod Polst-Cpm Polst Er] Other (See Comments)    UNSPECIFIED REACTION  > "caused prostate problems"  . Ace Inhibitors Cough    Social History   Socioeconomic History  . Marital status: Married    Spouse name: Not on file  . Number of children: 2  . Years of education: Not on file  . Highest education level: Not on file  Occupational History  . Occupation: retired Solicitor   Social Needs  . Financial resource strain: Not on file  . Food insecurity:    Worry: Not on file    Inability: Not on file  . Transportation needs:    Medical: Not on file    Non-medical: Not on file  Tobacco Use  . Smoking status: Former Smoker    Packs/day: 1.50    Years: 40.00    Pack years: 60.00    Types: Cigarettes, Pipe    Last attempt to quit: 12/11/2002    Years since quitting: 16.0  . Smokeless tobacco: Never Used  Substance and Sexual Activity  . Alcohol use: No    Alcohol/week: 0.0 standard drinks  . Drug use: No  . Sexual activity: Not Currently  Lifestyle  . Physical activity:    Days per week: Not on file  Minutes per session: Not on file  . Stress: Not on file  Relationships  . Social connections:    Talks on phone: Not on file    Gets together: Not on file    Attends religious service: Not on file    Active member  of club or organization: Not on file    Attends meetings of clubs or organizations: Not on file    Relationship status: Not on file  . Intimate partner violence:    Fear of current or ex partner: Not on file    Emotionally abused: Not on file    Physically abused: Not on file    Forced sexual activity: Not on file  Other Topics Concern  . Not on file  Social History Narrative  . Not on file    Family History  Problem Relation Age of Onset  . Lung cancer Mother   . Cancer Mother   . Diabetes Mother   . Hypertension Mother   . Hyperlipidemia Mother   . Heart disease Mother        before age 83  . Heart attack Father   . Heart disease Father        before age 32  . Hypertension Father   . Colon cancer Sister   . Cancer Sister   . Hypertension Sister   . Hyperlipidemia Sister   . Parkinson's disease Sister   . Diabetes Sister   . Heart disease Sister        before age 49  . Heart attack Maternal Grandmother   . Diabetes Daughter    Blood pressure 126/74, pulse 75, weight 119.8 kg (264 lb 2 oz), SpO2 98 %.   Wt Readings from Last 3 Encounters:  01/06/19 119.8 kg (264 lb 2 oz)  11/29/18 119.7 kg (264 lb)  11/21/18 120.7 kg (266 lb)  General: NAD Neck: No JVD, no thyromegaly or thyroid nodule.  Lungs: Distant breath sounds.  CV: Nondisplaced PMI.  Heart regular S1/S2, no S3/S4, no murmur.  No peripheral edema.  No carotid bruit.  Unable to palpate pedal pulses.  Abdomen: Soft, nontender, no hepatosplenomegaly, no distention.  Skin: Intact without lesions or rashes.  Neurologic: Alert and oriented x 3.  Psych: Normal affect. Extremities: No clubbing or cyanosis.  HEENT: Normal.    ASSESSMENT/PLAN:  1. Chronic systolic CHF: Ischemic cardiomyopathy. Last echo in 8/18 showed EF 30% with severe LV dilation and normal RV.  [employer] CRT-D.  RHC in 1/19 showed normal filling pressures and preserved cardiac output.  Stable NYHA class III symptoms.  He is not volume  overloaded on exam.  I think that his current dyspnea is related more to COPD than CHF. BP stable on lower Entresto. - Continue torsemide 80 mg daily.  BMET today.  - Continue carvedilol 25 mg twice a day  - Continue Entresto to 49/51 bid.     - Continue spironolactone 25 mg daily.  - I will arrange for repeat echo.  2. CAD s/p CABG: No chest pain. [place] 1/19 with stable anatomy (LAD is patent, other vessels occluded with collaterals from the LAD). - Continue 81 mg aspirin daily and atorvastatin, check lipids today. - Stop Plavix and start Xarelto 2.5 mg bid given his PAD.  3. VT s/p ICD: On amiodarone for history of ICD discharges due to VT.   - Check LFTs and TSH. Knows he needs yearly eye exams.   - Given increased tremor, I have cut back amiodarone to  100 mg daily. However, given multiple ICD discharges in the past, I do not want to stop it totally.  4. Morbid obesity: Still needs to work on diet/weight loss.  5. COPD: Moderate to severe.  I think that a lot of his symptoms are related to COPD.   - Followup with pulmonary. - Now has home oxygen.  6. Lung cancer: Undergoing radiation again.  7. PAD: Occluded left SFA and significant right SFA stenosis on last CTA.  ABIs in 11/19 with significant disease bilaterally, worse on left, but stable.  Does not get leg pain with exertion but has nocturnal cramps.  Followed by [facility], medical management for now.  He is on statin with optimized lipids.  8. AAA: s/p repair.  Followed at [facility].  No endoleak on last evaluation.  9. Tremor: Most likely essential tremor given family history.  However, cannot rule out role for amiodarone. As above, I think that he needs to continue at least a low dose of amiodarone.  Dr. Carles Collet with neurology discussed deep brain stimulation => this is on hold while he is being treated for lung cancer.    Followup in 3 months.   [name] 01/06/2019

## 2019-01-06 NOTE — Patient Instructions (Addendum)
STOP taking Plavix.  Begin taking Xarelto 2.5mg  (1 tab) twice a day.   Labs were done today. We will call you with any ABNORMAL results. No news is good news!  Your physician has requested that you have an echocardiogram. Echocardiography is a painless test that uses sound waves to create images of your heart. It provides your doctor with information about the size and shape of your heart and how well your heart's chambers and valves are working. This procedure takes approximately one hour. There are no restrictions for this procedure.  Your physician recommends that you schedule a follow-up appointment in 3 months, your ECHO will be scheduled the same day.

## 2019-01-07 ENCOUNTER — Ambulatory Visit
Admission: RE | Admit: 2019-01-07 | Discharge: 2019-01-07 | Disposition: A | Discharge status: Home / Self Care | Payer: Medicare Other | Source: Ambulatory Visit | Attending: Radiation Oncology | Admitting: Radiation Oncology

## 2019-01-07 DIAGNOSIS — Z510 Encounter for antineoplastic radiation therapy: Secondary | ICD-10-CM | POA: Diagnosis not present

## 2019-01-07 DIAGNOSIS — Z79899 Other long term (current) drug therapy: Secondary | ICD-10-CM | POA: Diagnosis not present

## 2019-01-07 DIAGNOSIS — R911 Solitary pulmonary nodule: Secondary | ICD-10-CM | POA: Diagnosis not present

## 2019-01-07 DIAGNOSIS — R0602 Shortness of breath: Secondary | ICD-10-CM | POA: Diagnosis not present

## 2019-01-07 DIAGNOSIS — R5383 Other fatigue: Secondary | ICD-10-CM | POA: Diagnosis not present

## 2019-01-07 DIAGNOSIS — R05 Cough: Secondary | ICD-10-CM | POA: Diagnosis not present

## 2019-01-08 ENCOUNTER — Telehealth (HOSPITAL_COMMUNITY): Payer: Self-pay | Admitting: Licensed Clinical Social Worker

## 2019-01-08 ENCOUNTER — Ambulatory Visit
Admission: RE | Admit: 2019-01-08 | Discharge: 2019-01-08 | Disposition: A | Discharge status: Home / Self Care | Payer: Medicare Other | Source: Ambulatory Visit | Attending: Radiation Oncology | Admitting: Radiation Oncology

## 2019-01-08 DIAGNOSIS — R5383 Other fatigue: Secondary | ICD-10-CM | POA: Diagnosis not present

## 2019-01-08 DIAGNOSIS — R05 Cough: Secondary | ICD-10-CM | POA: Diagnosis not present

## 2019-01-08 DIAGNOSIS — R0602 Shortness of breath: Secondary | ICD-10-CM | POA: Diagnosis not present

## 2019-01-08 DIAGNOSIS — R911 Solitary pulmonary nodule: Secondary | ICD-10-CM | POA: Diagnosis not present

## 2019-01-08 DIAGNOSIS — Z79899 Other long term (current) drug therapy: Secondary | ICD-10-CM | POA: Diagnosis not present

## 2019-01-08 DIAGNOSIS — Z510 Encounter for antineoplastic radiation therapy: Secondary | ICD-10-CM | POA: Diagnosis not present

## 2019-01-08 NOTE — Telephone Encounter (Signed)
CSW called pt to discuss re-enrolling for the [employer]- pt and wife in agreement and grant approved:  Member ID: [id number] Group ID: [id number] RxBin ID: [id number] Eligibility Start Date: 12/22/2018 Eligibility End Date: 12/22/2019 Assistance Amount: $1,000.00  CSW will continue to follow and assist as needed  Jorge Ny, LCSW Clinical Social Worker [facility] [phone]

## 2019-01-09 ENCOUNTER — Ambulatory Visit
Admission: RE | Admit: 2019-01-09 | Discharge: 2019-01-09 | Disposition: A | Discharge status: Home / Self Care | Payer: Medicare Other | Source: Ambulatory Visit | Attending: Radiation Oncology | Admitting: Radiation Oncology

## 2019-01-09 DIAGNOSIS — R5383 Other fatigue: Secondary | ICD-10-CM | POA: Diagnosis not present

## 2019-01-09 DIAGNOSIS — Z510 Encounter for antineoplastic radiation therapy: Secondary | ICD-10-CM | POA: Diagnosis not present

## 2019-01-09 DIAGNOSIS — Z79899 Other long term (current) drug therapy: Secondary | ICD-10-CM | POA: Diagnosis not present

## 2019-01-09 DIAGNOSIS — R05 Cough: Secondary | ICD-10-CM | POA: Diagnosis not present

## 2019-01-09 DIAGNOSIS — R0602 Shortness of breath: Secondary | ICD-10-CM | POA: Diagnosis not present

## 2019-01-09 DIAGNOSIS — R911 Solitary pulmonary nodule: Secondary | ICD-10-CM | POA: Diagnosis not present

## 2019-01-10 ENCOUNTER — Ambulatory Visit
Admission: RE | Admit: 2019-01-10 | Discharge: 2019-01-10 | Disposition: A | Discharge status: Home / Self Care | Payer: Medicare Other | Source: Ambulatory Visit | Attending: Radiation Oncology | Admitting: Radiation Oncology

## 2019-01-10 DIAGNOSIS — R05 Cough: Secondary | ICD-10-CM | POA: Diagnosis not present

## 2019-01-10 DIAGNOSIS — Z79899 Other long term (current) drug therapy: Secondary | ICD-10-CM | POA: Diagnosis not present

## 2019-01-10 DIAGNOSIS — R5383 Other fatigue: Secondary | ICD-10-CM | POA: Diagnosis not present

## 2019-01-10 DIAGNOSIS — R0602 Shortness of breath: Secondary | ICD-10-CM | POA: Diagnosis not present

## 2019-01-10 DIAGNOSIS — Z510 Encounter for antineoplastic radiation therapy: Secondary | ICD-10-CM | POA: Diagnosis not present

## 2019-01-10 DIAGNOSIS — R911 Solitary pulmonary nodule: Secondary | ICD-10-CM | POA: Diagnosis not present

## 2019-01-13 ENCOUNTER — Ambulatory Visit
Admission: RE | Admit: 2019-01-13 | Discharge: 2019-01-13 | Disposition: A | Discharge status: Home / Self Care | Payer: Medicare Other | Source: Ambulatory Visit | Attending: Radiation Oncology | Admitting: Radiation Oncology

## 2019-01-13 DIAGNOSIS — R5383 Other fatigue: Secondary | ICD-10-CM | POA: Insufficient documentation

## 2019-01-13 DIAGNOSIS — R05 Cough: Secondary | ICD-10-CM | POA: Insufficient documentation

## 2019-01-13 DIAGNOSIS — Z510 Encounter for antineoplastic radiation therapy: Secondary | ICD-10-CM | POA: Diagnosis not present

## 2019-01-13 DIAGNOSIS — Z79899 Other long term (current) drug therapy: Secondary | ICD-10-CM | POA: Diagnosis not present

## 2019-01-13 DIAGNOSIS — R0602 Shortness of breath: Secondary | ICD-10-CM | POA: Insufficient documentation

## 2019-01-13 DIAGNOSIS — R911 Solitary pulmonary nodule: Secondary | ICD-10-CM | POA: Diagnosis not present

## 2019-01-13 DIAGNOSIS — Z7982 Long term (current) use of aspirin: Secondary | ICD-10-CM | POA: Diagnosis not present

## 2019-01-14 ENCOUNTER — Ambulatory Visit
Admission: RE | Admit: 2019-01-14 | Discharge: 2019-01-14 | Disposition: A | Discharge status: Home / Self Care | Payer: Medicare Other | Source: Ambulatory Visit | Attending: Radiation Oncology | Admitting: Radiation Oncology

## 2019-01-14 DIAGNOSIS — R911 Solitary pulmonary nodule: Secondary | ICD-10-CM | POA: Diagnosis not present

## 2019-01-14 DIAGNOSIS — R5383 Other fatigue: Secondary | ICD-10-CM | POA: Diagnosis not present

## 2019-01-14 DIAGNOSIS — Z79899 Other long term (current) drug therapy: Secondary | ICD-10-CM | POA: Diagnosis not present

## 2019-01-14 DIAGNOSIS — R0602 Shortness of breath: Secondary | ICD-10-CM | POA: Diagnosis not present

## 2019-01-14 DIAGNOSIS — Z510 Encounter for antineoplastic radiation therapy: Secondary | ICD-10-CM | POA: Diagnosis not present

## 2019-01-14 DIAGNOSIS — R05 Cough: Secondary | ICD-10-CM | POA: Diagnosis not present

## 2019-01-15 ENCOUNTER — Ambulatory Visit
Admission: RE | Admit: 2019-01-15 | Discharge: 2019-01-15 | Disposition: A | Discharge status: Home / Self Care | Payer: Medicare Other | Source: Ambulatory Visit | Attending: Radiation Oncology | Admitting: Radiation Oncology

## 2019-01-15 DIAGNOSIS — R911 Solitary pulmonary nodule: Secondary | ICD-10-CM | POA: Diagnosis not present

## 2019-01-15 DIAGNOSIS — R0602 Shortness of breath: Secondary | ICD-10-CM | POA: Diagnosis not present

## 2019-01-15 DIAGNOSIS — Z510 Encounter for antineoplastic radiation therapy: Secondary | ICD-10-CM | POA: Diagnosis not present

## 2019-01-15 DIAGNOSIS — Z79899 Other long term (current) drug therapy: Secondary | ICD-10-CM | POA: Diagnosis not present

## 2019-01-15 DIAGNOSIS — R05 Cough: Secondary | ICD-10-CM | POA: Diagnosis not present

## 2019-01-15 DIAGNOSIS — R5383 Other fatigue: Secondary | ICD-10-CM | POA: Diagnosis not present

## 2019-01-16 ENCOUNTER — Ambulatory Visit
Admission: RE | Admit: 2019-01-16 | Discharge: 2019-01-16 | Disposition: A | Discharge status: Home / Self Care | Payer: Medicare Other | Source: Ambulatory Visit | Attending: Radiation Oncology | Admitting: Radiation Oncology

## 2019-01-16 DIAGNOSIS — R911 Solitary pulmonary nodule: Secondary | ICD-10-CM | POA: Diagnosis not present

## 2019-01-16 DIAGNOSIS — Z79899 Other long term (current) drug therapy: Secondary | ICD-10-CM | POA: Diagnosis not present

## 2019-01-16 DIAGNOSIS — R5383 Other fatigue: Secondary | ICD-10-CM | POA: Diagnosis not present

## 2019-01-16 DIAGNOSIS — R0602 Shortness of breath: Secondary | ICD-10-CM | POA: Diagnosis not present

## 2019-01-16 DIAGNOSIS — Z510 Encounter for antineoplastic radiation therapy: Secondary | ICD-10-CM | POA: Diagnosis not present

## 2019-01-16 DIAGNOSIS — R05 Cough: Secondary | ICD-10-CM | POA: Diagnosis not present

## 2019-01-17 ENCOUNTER — Ambulatory Visit
Admission: RE | Admit: 2019-01-17 | Discharge: 2019-01-17 | Disposition: A | Discharge status: Home / Self Care | Payer: Medicare Other | Source: Ambulatory Visit | Attending: Radiation Oncology | Admitting: Radiation Oncology

## 2019-01-17 DIAGNOSIS — R5383 Other fatigue: Secondary | ICD-10-CM | POA: Diagnosis not present

## 2019-01-17 DIAGNOSIS — R05 Cough: Secondary | ICD-10-CM | POA: Diagnosis not present

## 2019-01-17 DIAGNOSIS — Z510 Encounter for antineoplastic radiation therapy: Secondary | ICD-10-CM | POA: Diagnosis not present

## 2019-01-17 DIAGNOSIS — R911 Solitary pulmonary nodule: Secondary | ICD-10-CM | POA: Diagnosis not present

## 2019-01-17 DIAGNOSIS — R0602 Shortness of breath: Secondary | ICD-10-CM | POA: Diagnosis not present

## 2019-01-17 DIAGNOSIS — Z79899 Other long term (current) drug therapy: Secondary | ICD-10-CM | POA: Diagnosis not present

## 2019-01-20 ENCOUNTER — Ambulatory Visit
Admission: RE | Admit: 2019-01-20 | Discharge: 2019-01-20 | Disposition: A | Discharge status: Home / Self Care | Payer: Medicare Other | Source: Ambulatory Visit | Attending: Radiation Oncology | Admitting: Radiation Oncology

## 2019-01-20 DIAGNOSIS — Z510 Encounter for antineoplastic radiation therapy: Secondary | ICD-10-CM | POA: Diagnosis not present

## 2019-01-20 DIAGNOSIS — Z79899 Other long term (current) drug therapy: Secondary | ICD-10-CM | POA: Diagnosis not present

## 2019-01-20 DIAGNOSIS — R0602 Shortness of breath: Secondary | ICD-10-CM | POA: Diagnosis not present

## 2019-01-20 DIAGNOSIS — R5383 Other fatigue: Secondary | ICD-10-CM | POA: Diagnosis not present

## 2019-01-20 DIAGNOSIS — R911 Solitary pulmonary nodule: Secondary | ICD-10-CM | POA: Diagnosis not present

## 2019-01-20 DIAGNOSIS — R05 Cough: Secondary | ICD-10-CM | POA: Diagnosis not present

## 2019-01-22 ENCOUNTER — Encounter: Payer: Self-pay | Admitting: Radiation Oncology

## 2019-01-22 NOTE — Progress Notes (Signed)
  Radiation Oncology         [phone] ________________________________  Name: [name] MRN: [id number]  Date: 01/22/2019  DOB: 12-17-1945  End of Treatment Note  Diagnosis:   Nodule of right lung     Indication for treatment:  Curative       Radiation treatment dates:   12/09/18-01/20/19  Site/dose:   Right lung; 60 Gy in 30 fractions of 2 Gy (previous SBRT in this area so repeat SBRT not possible)  Beams/energy:   IMRT Photon; 6X  Narrative: The patient tolerated radiation treatment relatively well.     At the beginning of treatment, pt reported having some fatigue. Pt denied cough, hemoptysis, and difficulty swallowing throughout treatments. Overall the pt was without complaints. His wife admitted that he was a little more fatigued.  Plan: The patient has completed radiation treatment. The patient will return to radiation oncology clinic for routine followup in one month. I advised them to call or return sooner if they have any questions or concerns related to their recovery or treatment.  -----------------------------------  Blair Promise, PhD, MD  This document serves as a record of services personally performed by Gery Pray, MD. It was created on his behalf by Mary-Margaret Loma Messing, a trained medical scribe. The creation of this record is based on the scribe's personal observations and the provider's statements to them. This document has been checked and approved by the attending provider.

## 2019-02-07 ENCOUNTER — Other Ambulatory Visit: Payer: Self-pay

## 2019-02-07 DIAGNOSIS — I714 Abdominal aortic aneurysm, without rupture, unspecified: Secondary | ICD-10-CM

## 2019-02-07 DIAGNOSIS — I771 Stricture of artery: Secondary | ICD-10-CM

## 2019-02-07 DIAGNOSIS — I739 Peripheral vascular disease, unspecified: Secondary | ICD-10-CM

## 2019-02-11 ENCOUNTER — Ambulatory Visit (INDEPENDENT_AMBULATORY_CARE_PROVIDER_SITE_OTHER): Payer: Medicare Other | Admitting: Vascular Surgery

## 2019-02-11 ENCOUNTER — Other Ambulatory Visit: Payer: Self-pay

## 2019-02-11 ENCOUNTER — Ambulatory Visit (HOSPITAL_COMMUNITY)
Admission: RE | Admit: 2019-02-11 | Discharge: 2019-02-11 | Disposition: A | Discharge status: Home / Self Care | Payer: Medicare Other | Source: Ambulatory Visit | Attending: Vascular Surgery | Admitting: Vascular Surgery

## 2019-02-11 ENCOUNTER — Ambulatory Visit (INDEPENDENT_AMBULATORY_CARE_PROVIDER_SITE_OTHER)
Admission: RE | Admit: 2019-02-11 | Discharge: 2019-02-11 | Disposition: A | Discharge status: Home / Self Care | Payer: Medicare Other | Source: Ambulatory Visit | Attending: Vascular Surgery | Admitting: Vascular Surgery

## 2019-02-11 ENCOUNTER — Encounter: Payer: Self-pay | Admitting: Internal Medicine

## 2019-02-11 ENCOUNTER — Encounter: Payer: Self-pay | Admitting: Vascular Surgery

## 2019-02-11 ENCOUNTER — Ambulatory Visit (INDEPENDENT_AMBULATORY_CARE_PROVIDER_SITE_OTHER): Payer: Medicare Other | Admitting: Internal Medicine

## 2019-02-11 VITALS — BP 112/60 | HR 72 | Ht 68.0 in | Wt 275.0 lb

## 2019-02-11 VITALS — BP 82/48 | HR 71 | Resp 18 | Ht 68.0 in | Wt 273.7 lb

## 2019-02-11 DIAGNOSIS — I771 Stricture of artery: Secondary | ICD-10-CM | POA: Diagnosis not present

## 2019-02-11 DIAGNOSIS — I714 Abdominal aortic aneurysm, without rupture, unspecified: Secondary | ICD-10-CM

## 2019-02-11 DIAGNOSIS — I739 Peripheral vascular disease, unspecified: Secondary | ICD-10-CM

## 2019-02-11 DIAGNOSIS — Z95810 Presence of automatic (implantable) cardiac defibrillator: Secondary | ICD-10-CM

## 2019-02-11 DIAGNOSIS — I447 Left bundle-branch block, unspecified: Secondary | ICD-10-CM | POA: Diagnosis not present

## 2019-02-11 DIAGNOSIS — I5022 Chronic systolic (congestive) heart failure: Secondary | ICD-10-CM | POA: Diagnosis not present

## 2019-02-11 LAB — CUP PACEART INCLINIC DEVICE CHECK
Date Time Interrogation Session: 20200303155810
Implantable Lead Implant Date: 20060411
Implantable Lead Implant Date: 20151030
Implantable Lead Implant Date: 20151030
Implantable Lead Location: 753858
Implantable Lead Location: 753859
Implantable Lead Location: 753860
Implantable Lead Model: 148
Implantable Lead Model: 4136
Implantable Lead Serial Number: 145070
Implantable Lead Serial Number: 29713415
Implantable Pulse Generator Implant Date: 20151030
Pulse Gen Serial Number: 370874

## 2019-02-11 NOTE — Patient Instructions (Signed)
Medication Instructions:  Your physician recommends that you continue on your current medications as directed. Please refer to the Current Medication list given to you today.  Labwork: None ordered.  Testing/Procedures: None ordered.  Follow-Up: Your physician wants you to follow-up in: one year with Dr. Lovena Le.   You will receive a reminder letter in the mail two months in advance. If you don't receive a letter, please call our office to schedule the follow-up appointment.  Remote monitoring is used to monitor your ICD from home. This monitoring reduces the number of office visits required to check your device to one time per year. It allows [place] to keep an eye on the functioning of your device to ensure it is working properly. You are scheduled for a device check from home on 02/18/2019. You may send your transmission at any time that day. If you have a wireless device, the transmission will be sent automatically. After your physician reviews your transmission, you will receive a postcard with your next transmission date.  Any Other Special Instructions Will Be Listed Below (If Applicable).  If you need a refill on your cardiac medications before your next appointment, please call your pharmacy.

## 2019-02-11 NOTE — Progress Notes (Signed)
HPI [name] returns today for ICD followup. He is a very pleasant 73 year old man with chronic systolic heart failure, class III, left bundle branch block, prior coronary artery disease and bypass grafting, with multiple remote stents status post percutaneous coronary intervention, ventricular tachycardia with VT storm in 2008, managed with chronic suppressive amiodarone therapy. He admits to some dietary indiscretion, but he is trying to lose weight. Down four pounds from his last visit. He is s/p BiV ICD upgrade several months ago. He now has class 2CHF (from class 3B).  Allergies  Allergen Reactions  . Tussionex Pennkinetic Er [Hydrocod Polst-Cpm Polst Er] Other (See Comments)    UNSPECIFIED REACTION  > "caused prostate problems"  . Ace Inhibitors Cough     Current Outpatient Medications  Medication Sig Dispense Refill  . albuterol (PROVENTIL HFA;VENTOLIN HFA) 108 (90 BASE) MCG/ACT inhaler Inhale 2 puffs into the lungs every 6 (six) hours as needed for wheezing or shortness of breath. 3 Inhaler 4  . albuterol (PROVENTIL) (2.5 MG/3ML) 0.083% nebulizer solution Take 3 mLs (2.5 mg total) by nebulization every 6 (six) hours. 120 mL 3  . amiodarone (PACERONE) 200 MG tablet Take 0.5 tablets (100 mg total) by mouth daily. 45 tablet 3  . arformoterol (BROVANA) 15 MCG/2ML NEBU Take 2 mLs (15 mcg total) by nebulization 2 (two) times daily. 120 mL 5  . aspirin 81 MG tablet Take 81 mg by mouth daily.    Marland Kitchen atorvastatin (LIPITOR) 80 MG tablet Take 1 tablet (80 mg total) by mouth daily. 30 tablet 6  . ATROVENT HFA 17 MCG/ACT inhaler TAKE 2 PUFFS BY MOUTH EVERY 4 HOURS AS NEEDED FOR WHEEZE  12  . budesonide (PULMICORT) 0.5 MG/2ML nebulizer solution Take 2 mLs (0.5 mg total) by nebulization 2 (two) times daily. 120 mL 5  . carvedilol (COREG) 25 MG tablet TAKE 1 TABLET BY MOUTH  TWICE A DAY WITH MEALS 180 tablet 3  . diazepam (VALIUM) 5 MG tablet 1-2 tabs every 8 hours if needed for tremor 30  tablet 5  . ENTRESTO 49-51 MG TAKE 1 TABLET BY MOUTH TWICE A DAY 60 tablet 6  . fluticasone (FLONASE) 50 MCG/ACT nasal spray Instill 1 spray in each  nostril daily 32 g 3  . Glycopyrrolate (LONHALA MAGNAIR REFILL KIT) 25 MCG/ML SOLN Inhale 1 Act into the lungs 2 (two) times daily. 60 mL 11  . isosorbide mononitrate (IMDUR) 60 MG 24 hr tablet TAKE 1 TABLET BY MOUTH  DAILY 90 tablet 2  . KLOR-CON M20 20 MEQ tablet TAKE 1 TABLET BY MOUTH EVERY DAY 90 tablet 2  . levothyroxine (SYNTHROID, LEVOTHROID) 50 MCG tablet TAKE 1 TABLET BY MOUTH  DAILY BEFORE BREAKFAST 90 tablet 0  . nitroGLYCERIN (NITROSTAT) 0.4 MG SL tablet DISSOLVE 1 TABLET UNDER THE TONGUE EVERY 5 MINUTES AS  NEEDED FOR CHEST PAIN , MAX 3 TABLETS IN 15 MIN, CALL  911 IF CHEST PAIN PERSISTS 25 tablet 0  . NON FORMULARY at bedtime. CPAP And during the day as needed    . rivaroxaban (XARELTO) 2.5 MG TABS tablet Take 1 tablet (2.5 mg total) by mouth 2 (two) times daily. 60 tablet 3  . spironolactone (ALDACTONE) 25 MG tablet TAKE 1 TABLET BY MOUTH  DAILY 90 tablet 3  . torsemide (DEMADEX) 20 MG tablet Take 4 tablets (80 mg total) by mouth daily. 180 tablet 3   No current facility-administered medications for this visit.      Past Medical  History:  Diagnosis Date  . AAA (abdominal aortic aneurysm) ([place])    followed by Dr. Bridgett Larsson  . Adenomatous colon polyp 01/2004  . Anemia    hx  . Automatic implantable cardioverter-defibrillator in situ   . AVM (arteriovenous malformation)   . BPH (benign prostatic hypertrophy)   . CAD (coronary artery disease)    s/p CABGx2 in 1996  . Carotid artery stenosis    LCEA - Dr. Bridgett Larsson in 2013  . CHF (congestive heart failure) ([place])   . Complication of anesthesia    claustrophobic, unabe to lie on back more than 4 hours at time due to back  . COPD (chronic obstructive pulmonary disease) ([place])   . Diabetes mellitus    DIET CONTROLLED- pt states that this was a misdiagnosis, he was treated while in the  hospital  but returned home, loss a massive amount of weight and he has not had a problem with his blood sugar since. States everything has been normal for about 3 years.  . Diverticulosis   . Dyspnea   . Dysrhythmia    ICD-defibrillator  . Fatigue   . GERD (gastroesophageal reflux disease)   . H/O hiatal hernia   . History of radiation therapy 04/09/17-04/17/17   SBRT right lung 54 Gy in 3 fractions  . HLD (hyperlipidemia)   . Hypertension   . Hypothyroidism   . Ischemic cardiomyopathy   . Myocardial infarction ([place])   . NSCL ca   . OSA on CPAP    AHI durign total sleep 14.69/hr, during REM 50.91/hr  . Peripheral vascular disease ([facility])    LCEA, L renal artery stent, bilat iliac stents, R SFA stenosis  . Tremor   . Ventricular tachycardia ([place]) 09/09/2014   Amiodarone was started after appropriate defibrillator shocks for ventricular tachycardia in October 2008    ROS:   All systems reviewed and negative except as noted in the HPI.   Past Surgical History:  Procedure Laterality Date  . ABDOMINAL AORTIC ENDOVASCULAR STENT GRAFT [name] 01/06/2014   Procedure: ABDOMINAL AORTIC ENDOVASCULAR STENT GRAFT- GORE; ULTRASOUND GUIDED;  Surgeon: Conrad Ganado, MD;  Location: [place];  Service: Vascular;  Laterality: [name];  . ANGIOPLASTY     BILATERAL  LE  W/STENTS  . BI-VENTRICULAR IMPLANTABLE CARDIOVERTER DEFIBRILLATOR UPGRADE [name] 10/09/2014   Procedure: BI-VENTRICULAR IMPLANTABLE CARDIOVERTER DEFIBRILLATOR UPGRADE;  Surgeon: Evans Lance, MD;  Location: [facility] CATH LAB;  Service: Cardiovascular;  Laterality: [name];  . BIV ICD GENERTAOR CHANGE OUT  10/09/2014   UPGRADE TO BIV        BY DR Lovena Le  . 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 Adora Fridge)  . CARDIAC CATHETERIZATION  10/03/2002   SVG sequentially to OM1  & OM2 totally occluded at ostium, SVG to PDA totally occluded within previously placed prox vein graft stent, smal distal AAA, bialt iliac stents with 30% left end-stent restenosis and 50% right end-stent restnosis(Dr. Gerrie Nordmann)  . CARDIAC CATHETERIZATION  06/11/1998   L main with 20% narrowing in distal 1/3; LAD with 1st diagonla having 70% ostial narrowing, 2nd diagonal with 40% narrowing in prox third, LIMA & RIMA widely patent; in-stent restenosis of RCIA with successful PTA and new prox SVTRCA stent for residual disease (Dr. Marella Chimes)  . CARDIAC DEFIBRILLATOR PLACEMENT  06/2005   [employer]  [facility] - ischemic cardiomyopathy - Dr. Marella Chimes  . CAROTID ENDARTERECTOMY Left 01/04/12  . CORONARY ANGIOPLASTY  01/08/2004   cutting balloon atherectomy & percutaneous intervention of RCIA in-stent restenosis (Dr. Marella Chimes)  . CORONARY ARTERY BYPASS GRAFT  11/04/1985   x2 - PDA and sequential DX-OM (Dr. Redmond Pulling)  . ENDARTERECTOMY  01/04/2012   Procedure: ENDARTERECTOMY CAROTID;left  Surgeon: Hinda Lenis, MD;  Location: [place];  Service: Vascular;  Laterality: Left;  with patch angioplasty  . FUDUCIAL PLACEMENT [name] 02/23/2017   Procedure: PLACEMENT OF FUDUCIAL;  Surgeon: [name], MD;  Location: [place];  Service: Thoracic;  Laterality: [name];  . ICD GENERATOR CHANGE  05/02/2010   [employer]  . ILIAC ARTERY STENT Bilateral 03/1997   and L SFA PTA  . Iron infusion  June 16, 2012  . LEFT HEART CATHETERIZATION WITH CORONARY ANGIOGRAM [name] 07/06/2014   Procedure: LEFT HEART CATHETERIZATION WITH CORONARY ANGIOGRAM;  Surgeon: Peter M Martinique, MD;  Location: [facility] CATH LAB;  Service: Cardiovascular;  Laterality: [name];  . NM MYOCAR PERF WALL MOTION  09/2012   lexiscan myoview - mod-severe perfusion defect r/t infarct or scar w/mild periinfarct ishcemia in basal inferior, mid inferior, apical inferior, basal inferolateral & mid inferoalteral regions - EF 21% low risk scan  .  POLYPECTOMY    . RENAL ARTERY STENT Left 03/24/2004   6x2m [employer] stent (Dr. RMarella Chimes  . RIGHT/LEFT HEART CATH AND CORONARY ANGIOGRAPHY [name] 12/28/2017   Procedure: RIGHT/LEFT HEART CATH AND CORONARY ANGIOGRAPHY;  Surgeon: MLarey Dresser MD;  [facility];  Service: Cardiovascular;  Laterality: [name];  . TRANSTHORACIC ECHOCARDIOGRAM  12/2012   EF 30-35; LV mod to severely dilated, mod concentric hypertrophy, severe hypokinesis of inferolateral myocardium, moderate hypokineis of anteroseptal region, grade 1 diastolic dysfunction; mod MR; LA mod-severely dialted; RV mod dialted; RA mildly dilated; PA peak pressure 341mg  . VIDEO BRONCHOSCOPY WITH ENDOBRONCHIAL NAVIGATION [name] 02/23/2017   Procedure: VIDEO BRONCHOSCOPY WITH ENDOBRONCHIAL NAVIGATION;  Surgeon: StMelrose NakayamaMD;  [facility];  Service: Thoracic;  Laterality: [name];     Family History  Problem Relation Age of Onset  . Lung cancer Mother   . Cancer Mother   . Diabetes Mother   . Hypertension Mother   . Hyperlipidemia Mother   . Heart disease Mother        before age 74. Heart attack Father   . Heart disease Father        before age 74. Hypertension Father   . Colon cancer Sister   . Cancer Sister   . Hypertension Sister   . Hyperlipidemia Sister   . Parkinson's disease Sister   . Diabetes Sister   . Heart disease Sister        before age 74. Heart attack Maternal Grandmother   . Diabetes Daughter      Social History   Socioeconomic History  . Marital status: Married    Spouse name: Not on file  . Number of children: 2  . Years of education: Not on file  . Highest education level: Not on file  Occupational History  . Occupation: retired poSolicitor Social Needs  . Financial resource strain: Not on file  . Food insecurity:    Worry: Not on file    Inability: Not on file  . Transportation needs:    Medical: Not on file  Non-medical: Not on file    Tobacco Use  . Smoking status: Former Smoker    Packs/day: 1.50    Years: 40.00    Pack years: 60.00    Types: Cigarettes, Pipe    Last attempt to quit: 12/11/2002    Years since quitting: 16.1  . Smokeless tobacco: Never Used  Substance and Sexual Activity  . Alcohol use: No    Alcohol/week: 0.0 standard drinks  . Drug use: No  . Sexual activity: Not Currently  Lifestyle  . Physical activity:    Days per week: Not on file    Minutes per session: Not on file  . Stress: Not on file  Relationships  . Social connections:    Talks on phone: Not on file    Gets together: Not on file    Attends religious service: Not on file    Active member of club or organization: Not on file    Attends meetings of clubs or organizations: Not on file    Relationship status: Not on file  . Intimate partner violence:    Fear of current or ex partner: Not on file    Emotionally abused: Not on file    Physically abused: Not on file    Forced sexual activity: Not on file  Other Topics Concern  . Not on file  Social History Narrative  . Not on file     BP 112/60   Pulse 72   Ht _0  (1.727 m)   Wt 275 lb (124.7 kg)   SpO2 98%   BMI 41.81 kg/m   Physical Exam:  Well appearing obese 73 yo man, NAD HEENT: Unremarkable Neck:  6 cm JVD, no thyromegally Lymphatics:  No adenopathy Back:  No CVA tenderness Lungs:  Clear with wheezes HEART:  Regular rate rhythm, no murmurs, no rubs, no clicks Abd:  soft, positive bowel sounds, no organomegally, no rebound, no guarding Ext:  2 plus pulses, no edema, no cyanosis, no clubbing Skin:  No rashes no nodules Neuro:  CN II through XII intact, motor grossly intact  EKG - NSR with biv pacing  DEVICE  Normal device function.  See PaceArt for details.   Assess/Plan: 1. VT - he is maintaining NSR s/p ICD insertion. 2. Chronic systolic heart failure - his symptoms are class 2 with. He is encouraged to lose weight and reduce his salt intake. 3. ICD  - his [employer] ICD is working normally. 4. CAD - he denies anginal symptoms. We will follow. 5. Obesity - his weight is down 4 lbs and I have asked him to lose at least 10 lbs.  [name].D.

## 2019-02-11 NOTE — Progress Notes (Signed)
Patient name: [name] MRN: [id number] DOB: 1946/05/25 Sex: male  REASON FOR VISIT: 1 year follow-up status post EVAR  HPI: [name] is a 73 y.o. 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  Patient reports no new abdominal or back pain.  He underwent an EVAR on 01/06/2014 with Dr. Bridgett Larsson.  He is noted to have a stenosis of his left iliac distal to the endograft limb as well as a left SFA occlusion.  He presents today and states he can walk with a cain about 1000 steps and gets no lower extremity pain consistent with claudication.  He does describe some intermittent cramping at night in his left leg.  He is due for another CT in the next week or two to monitor his response from RT for lung cancer.  Past Medical History:  Diagnosis Date  . AAA (abdominal aortic aneurysm) ([place])    followed by Dr. Bridgett Larsson  . Adenomatous colon polyp 01/2004  . Anemia    hx  . Automatic implantable cardioverter-defibrillator in situ   . AVM (arteriovenous malformation)   . BPH (benign prostatic hypertrophy)   . CAD (coronary artery disease)    s/p CABGx2 in 1996  . Carotid artery stenosis    LCEA - Dr. Bridgett Larsson in 2013  . CHF (congestive heart failure) ([place])   . Complication of anesthesia    claustrophobic, unabe to lie on back more than 4 hours at time due to back  . COPD (chronic obstructive pulmonary disease) ([place])   . Diabetes mellitus    DIET CONTROLLED- pt states that this was a misdiagnosis, he was treated while in the hospital  but returned home, loss a massive amount of weight and he has not had a problem with his blood sugar since. States everything has been normal for about 3 years.  . Diverticulosis   . Dyspnea   . Dysrhythmia    ICD-defibrillator  . Fatigue   . GERD (gastroesophageal reflux disease)   . H/O hiatal hernia   . History of  radiation therapy 04/09/17-04/17/17   SBRT right lung 54 Gy in 3 fractions  . HLD (hyperlipidemia)   . Hypertension   . Hypothyroidism   . Ischemic cardiomyopathy   . Myocardial infarction ([place])   . NSCL ca   . OSA on CPAP    AHI durign total sleep 14.69/hr, during REM 50.91/hr  . Peripheral vascular disease ([facility])    LCEA, L renal artery stent, bilat iliac stents, R SFA stenosis  . Tremor   . Ventricular tachycardia ([place]) 09/09/2014   Amiodarone was started after appropriate defibrillator shocks for ventricular tachycardia in October 2008    Past Surgical History:  Procedure Laterality Date  . ABDOMINAL AORTIC ENDOVASCULAR STENT GRAFT [name] 01/06/2014   Procedure: ABDOMINAL AORTIC ENDOVASCULAR STENT GRAFT- GORE; ULTRASOUND GUIDED;  Surgeon: Conrad [place], MD;  Location: [place];  Service: Vascular;  Laterality: [name];  . ANGIOPLASTY     BILATERAL  LE  W/STENTS  . BI-VENTRICULAR IMPLANTABLE CARDIOVERTER DEFIBRILLATOR UPGRADE [name] 10/09/2014   Procedure: BI-VENTRICULAR IMPLANTABLE CARDIOVERTER DEFIBRILLATOR UPGRADE;  Surgeon: Evans Lance, MD;  Location: [facility] CATH LAB;  Service: Cardiovascular;  Laterality: [name];  . BIV ICD GENERTAOR CHANGE OUT  10/09/2014   UPGRADE TO BIV        BY DR Lovena Le  . CARDIAC CATHETERIZATION  09/16/2007  occlusion of both vein grafts, no significant LAD disease or diagonal disease, Cfx collaterals from the left, 70% in-stent restenosis of L renal artery, normal L main, RCA occluded ostially (Dr. Adora Fridge)  . CARDIAC CATHETERIZATION  10/03/2002   SVG sequentially to OM1 & OM2 totally occluded at ostium, SVG to PDA totally occluded within previously placed prox vein graft stent, smal distal AAA, bialt iliac stents with 30% left end-stent restenosis and 50% right end-stent restnosis(Dr. Gerrie Nordmann)  . CARDIAC CATHETERIZATION  06/11/1998   L main with 20% narrowing in distal 1/3; LAD with 1st diagonla having 70% ostial narrowing, 2nd diagonal with 40% narrowing in prox third,  LIMA & RIMA widely patent; in-stent restenosis of RCIA with successful PTA and new prox SVTRCA stent for residual disease (Dr. Marella Chimes)  . CARDIAC DEFIBRILLATOR PLACEMENT  06/2005   [employer] Vitality HE - ischemic cardiomyopathy - Dr. Marella Chimes  . CAROTID ENDARTERECTOMY Left 01/04/12  . CORONARY ANGIOPLASTY  01/08/2004   cutting balloon atherectomy & percutaneous intervention of RCIA in-stent restenosis (Dr. Marella Chimes)  . CORONARY ARTERY BYPASS GRAFT  11/04/1985   x2 - PDA and sequential DX-OM (Dr. Redmond Pulling)  . ENDARTERECTOMY  01/04/2012   Procedure: ENDARTERECTOMY CAROTID;left  Surgeon: Hinda Lenis, MD;  Location: [place];  Service: Vascular;  Laterality: Left;  with patch angioplasty  . FUDUCIAL PLACEMENT [name] 02/23/2017   Procedure: PLACEMENT OF FUDUCIAL;  Surgeon: [name], MD;  Location: [place];  Service: Thoracic;  Laterality: [name];  . ICD GENERATOR CHANGE  05/02/2010   [employer], [employer]  . ILIAC ARTERY STENT Bilateral 03/1997   and L SFA PTA  . Iron infusion  June 16, 2012  . LEFT HEART CATHETERIZATION WITH CORONARY ANGIOGRAM [name] 07/06/2014   Procedure: LEFT HEART CATHETERIZATION WITH CORONARY ANGIOGRAM;  Surgeon: Peter M Martinique, MD;  Location[facility] C[facility] LAB;  Service: Cardiovascular;  Laterality: [name];  . NM MYOCAR PERF WALL MOTION  09/2012   lexiscan myoview - mod-severe perfusion defect r/t infarct or scar w/mild periinfarct ishcemia in basal inferior, mid inferior, apical inferior, basal inferolateral & mid inferoalteral regions - EF 21% low risk scan  . POLYPECTOMY    . RENAL ARTERY STENT Left 03/24/2004   6x7m Genesis stent (Dr. RMarella Chimes  . RIGHT/LEFT HEART CATH AND CORONARY ANGIOGRAPHY [name] 12/28/2017   Procedure: RIGHT/LEFT HEART CATH AND CORONARY ANGIOGRAPHY;  Surgeon: MLarey Dresser MD;  [facility];  Service: Cardiovascular;  Laterality: [name];  . TRANSTHORACIC ECHOCARDIOGRAM  12/2012   EF 30-35; LV mod to severely dilated, mod  concentric hypertrophy, severe hypokinesis of inferolateral myocardium, moderate hypokineis of anteroseptal region, grade 1 diastolic dysfunction; mod MR; LA mod-severely dialted; RV mod dialted; RA mildly dilated; PA peak pressure 362mg  . VIDEO BRONCHOSCOPY WITH ENDOBRONCHIAL NAVIGATION [name] 02/23/2017   Procedure: VIDEO BRONCHOSCOPY WITH ENDOBRONCHIAL NAVIGATION;  Surgeon: StMelrose NakayamaMD;  [facility];  Service: Thoracic;  Laterality: [name];    Family History  Problem Relation Age of Onset  . Lung cancer Mother   . Cancer Mother   . Diabetes Mother   . Hypertension Mother   . Hyperlipidemia Mother   . Heart disease Mother        before age 277. Heart attack Father   . Heart disease Father        before age 27101. Hypertension Father   . Colon cancer Sister   . Cancer Sister   .  Hypertension Sister   . Hyperlipidemia Sister   . Parkinson's disease Sister   . Diabetes Sister   . Heart disease Sister        before age 69  . Heart attack Maternal Grandmother   . Diabetes Daughter     SOCIAL HISTORY: Social History   Tobacco Use  . Smoking status: Former Smoker    Packs/day: 1.50    Years: 40.00    Pack years: 60.00    Types: Cigarettes, Pipe    Last attempt to quit: 12/11/2002    Years since quitting: 16.1  . Smokeless tobacco: Never Used  Substance Use Topics  . Alcohol use: No    Alcohol/week: 0.0 standard drinks    Allergies  Allergen Reactions  . Tussionex Pennkinetic Er [Hydrocod Polst-Cpm Polst Er] Other (See Comments)    UNSPECIFIED REACTION  > "caused prostate problems"  . Ace Inhibitors Cough    Current Outpatient Medications  Medication Sig Dispense Refill  . albuterol (PROVENTIL HFA;VENTOLIN HFA) 108 (90 BASE) MCG/ACT inhaler Inhale 2 puffs into the lungs every 6 (six) hours as needed for wheezing or shortness of breath. 3 Inhaler 4  . albuterol (PROVENTIL) (2.5 MG/3ML) 0.083% nebulizer solution Take 3 mLs (2.5 mg total) by nebulization  every 6 (six) hours. 120 mL 3  . amiodarone (PACERONE) 200 MG tablet Take 0.5 tablets (100 mg total) by mouth daily. 45 tablet 3  . arformoterol (BROVANA) 15 MCG/2ML NEBU Take 2 mLs (15 mcg total) by nebulization 2 (two) times daily. 120 mL 5  . aspirin 81 MG tablet Take 81 mg by mouth daily.    Marland Kitchen atorvastatin (LIPITOR) 80 MG tablet Take 1 tablet (80 mg total) by mouth daily. 30 tablet 6  . ATROVENT HFA 17 MCG/ACT inhaler TAKE 2 PUFFS BY MOUTH EVERY 4 HOURS AS NEEDED FOR WHEEZE  12  . budesonide (PULMICORT) 0.5 MG/2ML nebulizer solution Take 2 mLs (0.5 mg total) by nebulization 2 (two) times daily. 120 mL 5  . carvedilol (COREG) 25 MG tablet TAKE 1 TABLET BY MOUTH  TWICE A DAY WITH MEALS 180 tablet 3  . diazepam (VALIUM) 5 MG tablet 1-2 tabs every 8 hours if needed for tremor 30 tablet 5  . ENTRESTO 49-51 MG TAKE 1 TABLET BY MOUTH TWICE A DAY 60 tablet 6  . fluticasone (FLONASE) 50 MCG/ACT nasal spray Instill 1 spray in each  nostril daily 32 g 3  . Glycopyrrolate (LONHALA MAGNAIR REFILL KIT) 25 MCG/ML SOLN Inhale 1 Act into the lungs 2 (two) times daily. 60 mL 11  . isosorbide mononitrate (IMDUR) 60 MG 24 hr tablet TAKE 1 TABLET BY MOUTH  DAILY 90 tablet 2  . KLOR-CON M20 20 MEQ tablet TAKE 1 TABLET BY MOUTH EVERY DAY 90 tablet 2  . levothyroxine (SYNTHROID, LEVOTHROID) 50 MCG tablet TAKE 1 TABLET BY MOUTH  DAILY BEFORE BREAKFAST 90 tablet 0  . nitroGLYCERIN (NITROSTAT) 0.4 MG SL tablet DISSOLVE 1 TABLET UNDER THE TONGUE EVERY 5 MINUTES AS  NEEDED FOR CHEST PAIN , MAX 3 TABLETS IN 15 MIN, CALL  911 IF CHEST PAIN PERSISTS 25 tablet 0  . NON FORMULARY at bedtime. CPAP And during the day as needed    . rivaroxaban (XARELTO) 2.5 MG TABS tablet Take 1 tablet (2.5 mg total) by mouth 2 (two) times daily. 60 tablet 3  . spironolactone (ALDACTONE) 25 MG tablet TAKE 1 TABLET BY MOUTH  DAILY 90 tablet 3  . torsemide (DEMADEX) 20 MG tablet Take  4 tablets (80 mg total) by mouth daily. 180 tablet 3   No  current facility-administered medications for this visit.     REVIEW OF SYSTEMS:  [X]  denotes positive finding, [ ]  denotes negative finding Cardiac  Comments:  Chest pain or chest pressure:    Shortness of breath upon exertion:    Short of breath when lying flat:    Irregular heart rhythm:        Vascular    Pain in calf, thigh, or hip brought on by ambulation:    Pain in feet at night that wakes you up from your sleep:     Blood clot in your veins:    Leg swelling:         Pulmonary    Oxygen at home:    Productive cough:     Wheezing:         Neurologic    Sudden weakness in arms or legs:     Sudden numbness in arms or legs:     Sudden onset of difficulty speaking or slurred speech:    Temporary loss of vision in one eye:     Problems with dizziness:         Gastrointestinal    Blood in stool:     Vomited blood:         Genitourinary    Burning when urinating:     Blood in urine:        Psychiatric    Major depression:         Hematologic    Bleeding problems:    Problems with blood clotting too easily:        Skin    Rashes or ulcers:        Constitutional    Fever or chills:      PHYSICAL EXAM: Vitals:   02/11/19 1055  BP: (!) 82/48  Pulse: 71  Resp: 18  SpO2: 95%  Weight: 273 lb 11.2 oz (124.2 kg)  Height: 5' 8"  (1.727 m)    GENERAL: The patient is a well-nourished male, in no acute distress. The vital signs are documented above. CARDIAC: There is a regular rate and rhythm.  VASCULAR:  2+ femoral pulse on right 2+ femoral pulse on left Monophasic DP/PT signals PULMONARY: There is good air exchange bilaterally without wheezing or rales. ABDOMEN: Soft and non-tender with normal pitched bowel sounds.  Morbidly obese abdomen. MUSCULOSKELETAL: There are no major deformities or cyanosis. NEUROLOGIC: No focal weakness or paresthesias are detected. SKIN: There are no ulcers or rashes noted. PSYCHIATRIC: The patient has a normal affect.  DATA:     Aneurysm duplex: No evidence of endoleak aneurysm measured 5.1 from 5.0 cm one year go.  He has preserved ABI of 0.66 on the left and 0.74 on the right.  Left iliac duplex with velocities in the 250s on the left.  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  On follow-up duplex today he has stable aneurysm sac that now measures 5.1 cm from 5 cm 1 year ago with no evidence of endoleak.  Offered reassurance to the patient that this is all encouraging.  Should be noted that in Dr. Lianne Moris records he has a known left SFA occlusion and a left common iliac stenosis distal to his endograft.  On follow-up today his ABIs are preserved 0.74 and 0.66 bilaterally.  No claudication symptoms with walking.  He has a palpable left femoral pulse distal  to the stenosis.  The velocity on the left are in the 250 range on duplex and I do not have a recent comparison.  In discussing with the patient given that he does not claudicate when he walks and has a palpable femoral pulse on that side and states he cannot lay flat for an arteriogram at this time due to his heart failure I think I will follow this with close surveillance for now.  I will plan to see him back in 6 months with another aortoiliac duplex and ABIs.  Discussed we will get another aneurysm survey in one year.   Marty Heck, MD [facility] Office: [phone] Pager: [place]

## 2019-02-18 ENCOUNTER — Ambulatory Visit (INDEPENDENT_AMBULATORY_CARE_PROVIDER_SITE_OTHER): Payer: Medicare Other | Admitting: *Deleted

## 2019-02-18 DIAGNOSIS — I255 Ischemic cardiomyopathy: Secondary | ICD-10-CM

## 2019-02-19 LAB — CUP PACEART REMOTE DEVICE CHECK
Battery Remaining Longevity: 60 mo
Battery Remaining Percentage: 86 %
Brady Statistic RA Percent Paced: 0 %
Brady Statistic RV Percent Paced: 89 %
Date Time Interrogation Session: 20200310064100
HighPow Impedance: 44 Ohm
Implantable Lead Implant Date: 20060411
Implantable Lead Implant Date: 20151030
Implantable Lead Implant Date: 20151030
Implantable Lead Location: 753858
Implantable Lead Location: 753859
Implantable Lead Location: 753860
Implantable Lead Model: 148
Implantable Lead Model: 4136
Implantable Lead Serial Number: 145070
Implantable Lead Serial Number: 29713415
Implantable Pulse Generator Implant Date: 20151030
Lead Channel Impedance Value: 524 Ohm
Lead Channel Impedance Value: 647 Ohm
Lead Channel Impedance Value: 665 Ohm
Lead Channel Pacing Threshold Amplitude: 0.8 V
Lead Channel Pacing Threshold Amplitude: 0.8 V
Lead Channel Pacing Threshold Amplitude: 2.1 V
Lead Channel Pacing Threshold Pulse Width: 0.4 ms
Lead Channel Pacing Threshold Pulse Width: 0.4 ms
Lead Channel Pacing Threshold Pulse Width: 0.9 ms
Lead Channel Setting Pacing Amplitude: 2 V
Lead Channel Setting Pacing Amplitude: 2 V
Lead Channel Setting Pacing Amplitude: 3.5 V
Lead Channel Setting Pacing Pulse Width: 0.4 ms
Lead Channel Setting Pacing Pulse Width: 0.9 ms
Lead Channel Setting Sensing Sensitivity: 0.6 mV
Lead Channel Setting Sensing Sensitivity: 1 mV
Pulse Gen Serial Number: 370874

## 2019-02-26 ENCOUNTER — Encounter: Payer: Self-pay | Admitting: Cardiology

## 2019-02-26 NOTE — Progress Notes (Signed)
Remote ICD transmission.   

## 2019-03-03 ENCOUNTER — Other Ambulatory Visit (HOSPITAL_COMMUNITY): Payer: Self-pay | Admitting: Cardiology

## 2019-03-04 ENCOUNTER — Other Ambulatory Visit (HOSPITAL_COMMUNITY): Payer: Medicare Other

## 2019-03-13 ENCOUNTER — Other Ambulatory Visit: Payer: Self-pay | Admitting: Internal Medicine

## 2019-03-21 ENCOUNTER — Other Ambulatory Visit: Payer: Self-pay | Admitting: Internal Medicine

## 2019-04-02 ENCOUNTER — Ambulatory Visit (HOSPITAL_COMMUNITY)
Admission: RE | Admit: 2019-04-02 | Discharge: 2019-04-02 | Disposition: A | Discharge status: Home / Self Care | Payer: Medicare Other | Source: Ambulatory Visit | Attending: Cardiology | Admitting: Cardiology

## 2019-04-02 ENCOUNTER — Other Ambulatory Visit: Payer: Self-pay

## 2019-04-02 DIAGNOSIS — I5042 Chronic combined systolic (congestive) and diastolic (congestive) heart failure: Secondary | ICD-10-CM | POA: Insufficient documentation

## 2019-04-02 LAB — LIPID PANEL
Cholesterol: 117 mg/dL (ref 0–200)
HDL: 28 mg/dL — ABNORMAL LOW (ref >40)
LDL Cholesterol: 42 mg/dL (ref 0–99)
Total CHOL/HDL Ratio: 4.2 RATIO
Triglycerides: 234 mg/dL — ABNORMAL HIGH (ref <150)
VLDL: 47 mg/dL — ABNORMAL HIGH (ref 0–40)

## 2019-04-02 LAB — HEPATIC FUNCTION PANEL
ALT: 11 U/L (ref 0–44)
AST: 14 U/L — ABNORMAL LOW (ref 15–41)
Albumin: 3.5 g/dL (ref 3.5–5.0)
Alkaline Phosphatase: 79 U/L (ref 38–126)
Bilirubin, Direct: <0.1 mg/dL (ref 0.0–0.2)
Total Bilirubin: 0.5 mg/dL (ref 0.3–1.2)
Total Protein: 6.5 g/dL (ref 6.5–8.1)

## 2019-04-03 ENCOUNTER — Ambulatory Visit (HOSPITAL_COMMUNITY)
Admission: RE | Admit: 2019-04-03 | Discharge: 2019-04-03 | Disposition: A | Discharge status: Home / Self Care | Payer: Medicare Other | Source: Ambulatory Visit | Attending: Cardiology | Admitting: Cardiology

## 2019-04-03 ENCOUNTER — Telehealth: Payer: Self-pay

## 2019-04-03 DIAGNOSIS — I2510 Atherosclerotic heart disease of native coronary artery without angina pectoris: Secondary | ICD-10-CM | POA: Diagnosis not present

## 2019-04-03 DIAGNOSIS — I5042 Chronic combined systolic (congestive) and diastolic (congestive) heart failure: Secondary | ICD-10-CM | POA: Diagnosis not present

## 2019-04-03 MED ORDER — TORSEMIDE 20 MG PO TABS: ORAL_TABLET | ORAL | 3 refills | Status: DC

## 2019-04-03 NOTE — Progress Notes (Signed)
Spoke to the patient's wife. She said the patient is unable to write and went for a nap. She was able to schedule upcoming appointments and is agreeable to the medication changes. Denies further refill needs.

## 2019-04-03 NOTE — Patient Instructions (Addendum)
Lab work will need to be done in 1 week. This is scheduled for April 30th at 10:15am  INCREASE Torsemide 80mg  (4 tabs) every morning AND 40mg  (2 tabs) every evening.  Your physician has requested that you have an echocardiogram. Echocardiography is a painless test that uses sound waves to create images of your heart. It provides your doctor with information about the size and shape of your heart and how well your heart's chambers and valves are working. This procedure takes approximately one hour. There are no restrictions for this procedure. This will be done when available again. Someone will be in contact with you to schedule this visit.  You have been referred to [place]. She will be in contact with you to schedule a home visit in order to do a RedS vest reading.  Please follow up with [name] in 3 weeks with a telehealth visit. This is scheduled for: May 15th at 2:40pm

## 2019-04-03 NOTE — Progress Notes (Signed)
Heart Failure TeleHealth Note  Due to national recommendations of social distancing due to [place] 19, Audio/video telehealth visit is felt to be most appropriate for this patient at this time.  See MyChart message from today for patient consent regarding telehealth for [name].  Date:  04/03/2019   ID:  [name], DOB 1946/04/30, MRN [id number]  Location: Home  Provider location: [place] [facility] Type of Visit: Established patient  PCP:  [name], MD  Cardiologist:  [name]  Chief Complaint: Shortness of breath   History of Present Illness: [name] is a 73 y.o. male who presents via audio/video conferencing for a telehealth visit today.     he denies symptoms worrisome for COVID 19.   Patient has a past medical history significant for morbid obesity, systolic heart failure with an EF of 30-35% (January 2014) -> 20-25% (March 2015 - RV normal), VT on amio, CAD s/p CABG 1996, LBBB, COPD, obstructive sleep apnea on CPAP, AAA repair (January 2015).   Admitted to [facility] July 24 through July 06 2014 with chest pain.  CEs negative. Had a LHC with no change from previous LHC with recommendations to continue medical management. Discharge weight was 255 pounds.   LHC 07/06/14 --No significant change from previous studies.  Left anterior descending (LAD): The LAD is a large vessel. There is a 40% stenosis immediately after the takeoff of a large septal perforator. There are 2 large diagonal branches without significant disease. Left circumflex (LCx): The LCx is occluded proximally. There are left to left collaterals.  Right coronary artery (RCA): The RCA is occluded proximally immediately following the conus branch. There are right to right and left to right collaterals. SVGs from CABG known to be totally occluded.   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   CTA chest/abd/pelvis (3/16) with moderate emphysema, 1.3 cm nodule RUL, left SFA  totally occluded, right SFA with significant stenosis, s/p endovascular AAA repair (stable).   Echo (5/17) with EF 20-25%, severe LV dilation, mild MR, normal RV size with mildly decreased systolic function.   He has a RUL nodule that is most likely lung cancer, he is being treated with radiation.   Echo (8/18) with EF 30%, basal-mid inferior and inferolateral AK, severe LV dilation, normal RV, mild MR.   RHC/LHC was done in 1/19 due to worsening exertional dyspnea.  This showed normal filling pressures and preserved cardiac output.  There was 30-40% mLAD stenosis.  The LAD provided collaterals to the LCx and RCA territories.  The LCx, RCA, SVG-OM, and SVG-PDA were all chronically occluded (known from the past).  No new disease.   [facility] with 0.78 on right, 0.59 on left => left iliac > 50% stenosis.  11/19 ABIs 0.78 right, 0.59 left.  3/20 ABIs 0.74 right, 0.66 left.   He saw neurology about his tremor.  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  However, he is on amiodarone which may be contributing.  He has been seeing Dr. Vertell Limber with plan for deep brain stimulator, on hold currently due to cancer treatment.   He had chest radiation for RLL lung cancer.  However, CT showed new RUL lesion in 11/19 and he restart XRT in 12/19, repeat XRT is now completed.   Weight has been stable recently.  However, he is noting more exertional dyspnea.  He is short of breath bathing, dressing, and washing dishes.  He generally does ok walking  from room to room in the house.  No chest pain.  He has chronic orthopnea and sleeps propped on several pillows.  No palpitations or lightheadedness.  He has fatigued easily since he started his 2nd round of radiation.   Labs 3/15 Cr 1.5 K 5.6 Labs 07/06/14 K 3.9 Creatinine  0.98 Labs 9/15 K 5.1, creatinine 0.96 Labs 11/15 K 3.8, creatinine 0.83, LDL 61, LFTs normal, TSH normal Labs 11/26/14 K 5.0 Creatinine 0.73 , LFTs normal, TSH normal,  Labs  12/08/14 K 3.4 Creatinine 0.83  Labs 3/16 K 4.4, creatinine 1.03, LFTs normal, TSH normal, HCT 37.8, BNP 65 Labs 6/16 K 4.1, creatinine 0.93, TSH normal, LFTs normal Labs 11/16 K 5.1, creatinine 1.13, LFTs normal, TSH normal, LDL 83, HDL 33, TGs 162 Labs 1/17 K 4.4, creatinine 0.86, pro-BNP 154 Labs 2/17 BNP 113, K 3.8, creatinine 0.79, TSH normal, LFTs normal Labs 4/17 K 3.8, creatinine 0.91, HCT 48.1 Labs 6/17 K 3.5, creatinine 0.98 Labs 9/17 K 3.9, creatinine 0.79, LDL 69, HDL 29, TSH normal, LFTs normal Labs 3/18 K 3.6, creatinine 0.87 Labs 8/18 K 3.3 => 4.2, creatinine 0.78 => 1.04, LDL 66, HDL 38, BNP 166 Labs 9/18: K 4.7, creatinine 0.9, BNP 144 Labs 11/18: K 4.8, creatinine 1.05, TSH normal, LFTs normal.  Labs 1/19: K 4.4, creatinine 1.08, LFTs normal Labs 5/19: LDL 60, HDL 28, TSH normal, LFTs normal, K 4.6, creatinine 1.43 Labs 11/19: K 4.6, creatinine 1.42, LFTs normal, TSH normal, Mg 2.3 Labs 1/20: TSH normal, K 4.2, creatinine 1.12, LFTs normal Labs 4/20: LDL 42, HDL 28, TGs 234  Past Medical History:  Diagnosis Date   AAA (abdominal aortic aneurysm) ([place])    followed by Dr. Bridgett Larsson   Adenomatous colon polyp 01/2004   Anemia    hx   Automatic implantable cardioverter-defibrillator in situ    AVM (arteriovenous malformation)    BPH (benign prostatic hypertrophy)    CAD (coronary artery disease)    s/p CABGx2 in 1996   Carotid artery stenosis    LCEA - Dr. Bridgett Larsson in 2013   CHF (congestive heart failure) (HCC)    Complication of anesthesia    claustrophobic, unabe to lie on back more than 4 hours at time due to back   COPD (chronic obstructive pulmonary disease) ([place])    Diabetes mellitus    DIET CONTROLLED- pt states that this was a misdiagnosis, he was treated while in the [facility]  but returned home, loss a massive amount of weight and he has not had a problem with his blood sugar since. States everything has been normal for about 3 years.   Diverticulosis      Dyspnea    Dysrhythmia    ICD-defibrillator   Fatigue    GERD (gastroesophageal reflux disease)    H/O hiatal hernia    History of radiation therapy 04/09/17-04/17/17   SBRT right lung 54 Gy in 3 fractions   HLD (hyperlipidemia)    Hypertension    Hypothyroidism    Ischemic cardiomyopathy    Myocardial infarction ([place])    NSCL ca    OSA on CPAP    AHI durign total sleep 14.69/hr, during REM 50.91/hr   Peripheral vascular disease (HCC)    LCEA, L renal artery stent, bilat iliac stents, R SFA stenosis   Tremor    Ventricular tachycardia ([place]) 09/09/2014   Amiodarone was started after appropriate defibrillator shocks for ventricular tachycardia in October 2008   Past Surgical History:  Procedure Laterality Date  ABDOMINAL AORTIC ENDOVASCULAR STENT GRAFT [name] 01/06/2014   Procedure: ABDOMINAL AORTIC ENDOVASCULAR STENT GRAFT- GORE; ULTRASOUND GUIDED;  Surgeon: Conrad [place], MD;  Location: [facility];  Service: Vascular;  Laterality: [name];   ANGIOPLASTY     BILATERAL  LE  W/STENTS   BI-VENTRICULAR IMPLANTABLE CARDIOVERTER DEFIBRILLATOR UPGRADE [name] 10/09/2014   Procedure: BI-VENTRICULAR IMPLANTABLE CARDIOVERTER DEFIBRILLATOR UPGRADE;  Surgeon: Evans Lance, MD;  Location: [facility];  Service: Cardiovascular;  Laterality: [name];   BIV ICD GENERTAOR CHANGE OUT  10/09/2014   UPGRADE TO BIV        BY DR Lovena Le   CARDIAC CATHETERIZATION  09/16/2007   occlusion of both vein grafts, no significant LAD disease or diagonal disease, Cfx collaterals from the left, 70% in-stent restenosis of L renal artery, normal L main, RCA occluded ostially (Dr. Adora Fridge)   CARDIAC CATHETERIZATION  10/03/2002   SVG sequentially to Brady totally occluded at ostium, SVG to PDA totally occluded within previously placed prox vein graft stent, smal distal AAA, bialt iliac stents with 30% left end-stent restenosis and 50% right end-stent restnosis(Dr. Gerrie Nordmann)   CARDIAC CATHETERIZATION  06/11/1998    L main with 20% narrowing in distal 1/3; LAD with 1st diagonla having 70% ostial narrowing, 2nd diagonal with 40% narrowing in prox third, LIMA & RIMA widely patent; in-stent restenosis of RCIA with successful PTA and new prox SVTRCA stent for residual disease (Dr. Marella Chimes)   [place]  06/2005   Guidant Vitality HE - ischemic cardiomyopathy - Dr. Marella Chimes   CAROTID ENDARTERECTOMY Left 01/04/12   CORONARY ANGIOPLASTY  01/08/2004   cutting balloon atherectomy & percutaneous intervention of RCIA in-stent restenosis (Dr. Marella Chimes)   CORONARY ARTERY BYPASS GRAFT  11/04/1985   x2 - PDA and sequential DX-OM (Dr. Redmond Pulling)   ENDARTERECTOMY  01/04/2012   Procedure: ENDARTERECTOMY CAROTID;left  Surgeon: Hinda Lenis, MD;  Location: [place];  Service: Vascular;  Laterality: Left;  with patch angioplasty   FUDUCIAL PLACEMENT [name] 02/23/2017   Procedure: PLACEMENT OF FUDUCIAL;  Surgeon: [name], MD;  Location: [place];  Service: Thoracic;  Laterality: [name];   ICD GENERATOR CHANGE  05/02/2010   [employer] Teligen   ILIAC ARTERY STENT Bilateral 03/1997   and L SFA PTA   Iron infusion  June 16, 2012   LEFT HEART CATHETERIZATION WITH CORONARY ANGIOGRAM [name] 07/06/2014   Procedure: LEFT HEART CATHETERIZATION WITH CORONARY ANGIOGRAM;  Surgeon: Peter M Martinique, MD;  Location: [facility] CATH LAB;  Service: Cardiovascular;  Laterality: [name];   NM MYOCAR PERF WALL MOTION  09/2012   lexiscan myoview - mod-severe perfusion defect r/t infarct or scar w/mild periinfarct ishcemia in basal inferior, mid inferior, apical inferior, basal inferolateral & mid inferoalteral regions - EF 21% low risk scan   POLYPECTOMY     RENAL ARTERY STENT Left 03/24/2004   6x24m Genesis stent (Dr. RMarella Chimes   RIGHT/LEFT HEART CATH AND CORONARY ANGIOGRAPHY [name] 12/28/2017   Procedure: RIGHT/LEFT HEART CATH AND CORONARY ANGIOGRAPHY;  Surgeon: MLarey Dresser MD;  Location: [facility];  Service: Cardiovascular;  Laterality: [name];   TRANSTHORACIC ECHOCARDIOGRAM  12/2012   EF 30-35; LV mod to severely dilated, mod concentric hypertrophy, severe hypokinesis of inferolateral myocardium, moderate hypokineis of anteroseptal region, grade 1 diastolic dysfunction; mod MR; LA mod-severely dialted; RV mod dialted; RA mildly dilated; PA peak pressure 392mg   VIDEO BRONCHOSCOPY WITH ENDOBRONCHIAL NAVIGATION [name] 02/23/2017   Procedure:  VIDEO BRONCHOSCOPY WITH ENDOBRONCHIAL NAVIGATION;  Surgeon: [name], MD;  Location: [facility];  Service: Thoracic;  Laterality: [name];     Current Outpatient Medications  Medication Sig Dispense Refill   albuterol (PROVENTIL HFA;VENTOLIN HFA) 108 (90 BASE) MCG/ACT inhaler Inhale 2 puffs into the lungs every 6 (six) hours as needed for wheezing or shortness of breath. 3 Inhaler 4   albuterol (PROVENTIL) (2.5 MG/3ML) 0.083% nebulizer solution Take 3 mLs (2.5 mg total) by nebulization every 6 (six) hours. 120 mL 3   amiodarone (PACERONE) 200 MG tablet Take 0.5 tablets (100 mg total) by mouth daily. 45 tablet 3   arformoterol (BROVANA) 15 MCG/2ML NEBU Take 2 mLs (15 mcg total) by nebulization 2 (two) times daily. 120 mL 5   aspirin 81 MG tablet Take 81 mg by mouth daily.     atorvastatin (LIPITOR) 80 MG tablet Take 1 tablet (80 mg total) by mouth daily. 30 tablet 6   ATROVENT HFA 17 MCG/ACT inhaler TAKE 2 PUFFS BY MOUTH EVERY 4 HOURS AS NEEDED FOR WHEEZE  12   budesonide (PULMICORT) 0.5 MG/2ML nebulizer solution Take 2 mLs (0.5 mg total) by nebulization 2 (two) times daily. 120 mL 5   carvedilol (COREG) 25 MG tablet TAKE 1 TABLET BY MOUTH  TWICE A DAY WITH MEALS 180 tablet 3   diazepam (VALIUM) 5 MG tablet 1-2 tabs every 8 hours if needed for tremor 30 tablet 5   ENTRESTO 49-51 MG TAKE 1 TABLET BY MOUTH TWICE A DAY 60 tablet 6   fluticasone (FLONASE) 50 MCG/ACT nasal spray Instill 1 spray in each  nostril daily 32 g 3    Glycopyrrolate (LONHALA MAGNAIR REFILL KIT) 25 MCG/ML SOLN Inhale 1 Act into the lungs 2 (two) times daily. 60 mL 11   isosorbide mononitrate (IMDUR) 60 MG 24 hr tablet TAKE 1 TABLET BY MOUTH  DAILY 90 tablet 3   KLOR-CON M20 20 MEQ tablet TAKE 1 TABLET BY MOUTH EVERY DAY 90 tablet 2   levothyroxine (SYNTHROID, LEVOTHROID) 50 MCG tablet TAKE 1 TABLET BY MOUTH  DAILY BEFORE BREAKFAST 90 tablet 0   nitroGLYCERIN (NITROSTAT) 0.4 MG SL tablet DISSOLVE 1 TABLET UNDER THE TONGUE EVERY 5 MINUTES AS  NEEDED FOR CHEST PAIN , MAX 3 TABLETS IN 15 MIN, CALL  911 IF CHEST PAIN PERSISTS 25 tablet 0   NON FORMULARY at bedtime. CPAP And during the day as needed     rivaroxaban (XARELTO) 2.5 MG TABS tablet Take 1 tablet (2.5 mg total) by mouth 2 (two) times daily. 60 tablet 3   spironolactone (ALDACTONE) 25 MG tablet TAKE 1 TABLET BY MOUTH  DAILY 90 tablet 3   torsemide (DEMADEX) 20 MG tablet Take 4 tablets (80 mg total) by mouth every morning AND 2 tablets (40 mg total) every evening. 540 tablet 3   No current facility-administered medications for this encounter.     Allergies:   Tussionex pennkinetic er [hydrocod polst-cpm polst er] and Ace inhibitors   Social History:  The patient  reports that he quit smoking about 16 years ago. His smoking use included cigarettes and pipe. He has a 60.00 pack-year smoking history. He has never used smokeless tobacco. He reports that he does not drink alcohol or use drugs.   Family History:  The patient's family history includes Cancer in his mother and sister; Colon cancer in his sister; Diabetes in his daughter, mother, and sister; Heart attack in his father and maternal grandmother; Heart disease in his  father, mother, and sister; Hyperlipidemia in his mother and sister; Hypertension in his father, mother, and sister; Lung cancer in his mother; Parkinson's disease in his sister.   ROS:  Please see the history of present illness.   All other systems are personally  reviewed and negative.   Exam:  (Video/Tele Health Call; Exam is subjective and or/visual.) General:  Speaks in full sentences. No resp difficulty. JVP 10-12 cm Lungs: Normal respiratory effort with conversation.  Abdomen: Non-distended per patient report Extremities: Pt denies edema. Neuro: Alert & oriented x 3.   Recent Labs: 09/09/2018: Pro B Natriuretic peptide (BNP) 85.0 10/15/2018: Hemoglobin 15.2; Magnesium 2.3; Platelets 199.0 01/06/2019: BUN 24; Creatinine, Ser 1.12; Potassium 4.2; Sodium 138; TSH 3.910 04/02/2019: ALT 11  Personally reviewed   Wt Readings from Last 3 Encounters:  02/11/19 124.7 kg (275 lb)  02/11/19 124.2 kg (273 lb 11.2 oz)  01/06/19 119.8 kg (264 lb 2 oz)      ASSESSMENT AND PLAN:  1. Chronic systolic CHF: Ischemic cardiomyopathy. Last echo in 8/18 showed EF 30% with severe LV dilation and normal RV.  [employer] CRT-D.  RHC in 1/19 showed normal filling pressures and preserved cardiac output.  NYHA class III symptoms, somewhat worse recently.  Exam via video suggests volume overload.  - Increase torsemide to 80 qam/40 qpm.  Cut back on sodium in diet.  BMET 1 week.  - Continue carvedilol 25 mg twice a day  - Continue Entresto to 49/51 bid.     - Continue spironolactone 25 mg daily.  - I will arrange for repeat echo when coronavirus restrictions are lifted.  - I will arrange for him to get a ReDS vest measurement at home.  2. CAD s/p CABG: No chest pain. [place] 1/19 with stable anatomy (LAD is patent, other vessels occluded with collaterals from the LAD). - Continue 81 mg aspirin daily and atorvastatin. - Continue Xarelto 2.5 mg bid.  3. VT s/p ICD: On amiodarone for history of ICD discharges due to VT.   - Check LFTs and TSH. Knows he needs yearly eye exams.   - Given increased tremor, I have cut back amiodarone to 100 mg daily. However, given multiple ICD discharges in the past, I do not want to stop it totally.  4. Morbid obesity: Still needs to  work on diet/weight loss.  5. COPD: Moderate to severe.  I think that a lot of his symptoms are related to COPD.   - Followup with pulmonary. - Now has home oxygen.  6. Lung cancer: He has completed radiation.   7. PAD: Occluded left SFA and significant right SFA stenosis on last CTA.  ABIs in 3/20 with significant disease bilaterally but stable.  Does not get leg pain with exertion but has nocturnal cramps.  [facility], medical management for now.  He is on statin with optimized LDL.  8. AAA: s/p repair.  Followed at [facility].  No endoleak on last evaluation.  9. Tremor: Most likely essential tremor given family history.  However, cannot rule out role for amiodarone. As above, I think that he needs to continue at least a low dose of amiodarone.  Dr. Carles Collet with neurology discussed deep brain stimulation => this is on hold while he is being treated for lung cancer.   10. Hyperlipidemia: Good LDL on atorvastatin but triglycerides high.  - I will have him start Vascepa 2 g bid. Lipids 2 months.   COVID screen The patient does not have any  symptoms that suggest any further testing/ screening at this time.  Social distancing reinforced today.  Patient Risk: After full review of this patients clinical status, I feel that they are at moderate risk for cardiac decompensation at this time.  Relevant cardiac medications were reviewed at length with the patient today. The patient does not have concerns regarding their medications at this time.   Recommended follow-up:  3 wks via telehealth  Today, I have spent 21 minutes with the patient with telehealth technology discussing the above issues .    Signed, [name], MD  04/03/2019 3:42 PM  [facility] [address] [facility] [place] [address] [phone] (office) [phone] (fax)

## 2019-04-03 NOTE — Telephone Encounter (Signed)
Left VM for [name] to return call regarding home visit as requested by HF clinic for ReDS clip reading

## 2019-04-04 ENCOUNTER — Telehealth (HOSPITAL_COMMUNITY): Payer: Self-pay

## 2019-04-04 ENCOUNTER — Telehealth (HOSPITAL_COMMUNITY): Payer: Self-pay | Admitting: Cardiology

## 2019-04-04 NOTE — Telephone Encounter (Signed)
Spoke with patients wife, aware of results. Reports will monitor diet and make adjustments.

## 2019-04-04 NOTE — Telephone Encounter (Signed)
Received call from Vanita Ingles, RN with [facility]. Dr Aundra Dubin had requested ReDS reading and vitals on [name] today.  ReDS reading is 31%. Unclear accuracy as he is just over the BMI limit and it took 5 attempts to get a reading. BP 100/56, HR 75, O2 93-94% on RA (has home oxygen), weight 263 lbs. No edema. Remains SOB on exertion and fatigued.   Will continue current regimen. Dr Aundra Dubin increased his torsemide to 80 mg am, 40 mg pm yesterday. He is scheduled for bloodwork next week. I asked that he weigh every day to help [place] manage him remotely.  Georgiana Gugel, NP

## 2019-04-04 NOTE — Addendum Note (Signed)
Encounter addended by: Marlise Eves, RN on: 04/04/2019 9:04 AM  Actions taken: Order list changed, Diagnosis association updated

## 2019-04-04 NOTE — Telephone Encounter (Signed)
-----   Message from Larey Dresser, MD sent at 04/02/2019 11:15 AM EDT ----- Good LDL.  Triglycerides still high, watch diet.

## 2019-04-05 NOTE — Progress Notes (Signed)
G[facility]y [facility]     DOB: [birth year]  Purpose of Visit: Home Visit: ReDS clip reading HF provider: [facility]  Medications: Is the patient taking all medications listed on MAR from Epic? Yes  List any medications that are not being taken correctly: [name]  List any medication refills needed: No  Is the patient able to pick up medications? Yes  Vitals: BP:  100/56  HR: 75  Oxygen: 93-94% RA Weight:  263lb        Physical Exam: Circle, add more if applicable.   Peripheral edema: no  Wounds: no  Location:  Any patient concerns?  He c/o feeling more fatigued x last 3-4 weeks.  He c/o getting shob w/exertion.  Reported to Caryl Pina, NP at HF clinic who suggests that Mr. Cage wear his O2 aat, as did his oncologist per his wife.   ReDS Vest/Clip Reading:31% (took 5 tries to obtain possibly d/t his elevated BMI)  Rhythm Strip:[name]  Is Home Health recommended? No If yes, state reason:   Per Caryl Pina, NP at HF clinic, he can take higher dose of torsemide for now and has labs scheduled at the clinic next week.  He should also take his weight Qday since he isn't currently (states that if he sees the vessels in his feet, he doesn't always weigh).  Pt and wife state understanding and know to call the clinic prn.    Vanita Ingles, RN 04/05/19

## 2019-04-07 ENCOUNTER — Ambulatory Visit (HOSPITAL_COMMUNITY): Payer: Medicare Other

## 2019-04-07 ENCOUNTER — Encounter (HOSPITAL_COMMUNITY): Payer: Medicare Other

## 2019-04-10 ENCOUNTER — Ambulatory Visit (HOSPITAL_COMMUNITY)
Admission: RE | Admit: 2019-04-10 | Discharge: 2019-04-10 | Disposition: A | Discharge status: Home / Self Care | Payer: Medicare Other | Source: Ambulatory Visit | Attending: Cardiology | Admitting: Cardiology

## 2019-04-10 ENCOUNTER — Other Ambulatory Visit: Payer: Self-pay

## 2019-04-10 DIAGNOSIS — I5042 Chronic combined systolic (congestive) and diastolic (congestive) heart failure: Secondary | ICD-10-CM | POA: Diagnosis not present

## 2019-04-10 LAB — COMPREHENSIVE METABOLIC PANEL
ALT: 12 U/L (ref 0–44)
AST: 13 U/L — ABNORMAL LOW (ref 15–41)
Albumin: 3.7 g/dL (ref 3.5–5.0)
Alkaline Phosphatase: 87 U/L (ref 38–126)
Anion gap: 11 (ref 5–15)
BUN: 45 mg/dL — ABNORMAL HIGH (ref 8–23)
CO2: 27 mmol/L (ref 22–32)
Calcium: 9.3 mg/dL (ref 8.9–10.3)
Chloride: 102 mmol/L (ref 98–111)
Creatinine, Ser: 1.57 mg/dL — ABNORMAL HIGH (ref 0.61–1.24)
GFR calc Af Amer: 50 mL/min — ABNORMAL LOW (ref >60)
GFR calc non Af Amer: 43 mL/min — ABNORMAL LOW (ref >60)
Glucose, Bld: 99 mg/dL (ref 70–99)
Potassium: 3.9 mmol/L (ref 3.5–5.1)
Sodium: 140 mmol/L (ref 135–145)
Total Bilirubin: 0.7 mg/dL (ref 0.3–1.2)
Total Protein: 6.7 g/dL (ref 6.5–8.1)

## 2019-04-10 LAB — CBC
HCT: 43.4 % (ref 39.0–52.0)
Hemoglobin: 13.7 g/dL (ref 13.0–17.0)
MCH: 30.4 pg (ref 26.0–34.0)
MCHC: 31.6 g/dL (ref 30.0–36.0)
MCV: 96.4 fL (ref 80.0–100.0)
Platelets: 154 10*3/uL (ref 150–400)
RBC: 4.5 MIL/uL (ref 4.22–5.81)
RDW: 13 % (ref 11.5–15.5)
WBC: 6.7 10*3/uL (ref 4.0–10.5)
nRBC: 0 % (ref 0.0–0.2)

## 2019-04-10 LAB — TSH: TSH: 4.61 u[IU]/mL — ABNORMAL HIGH (ref 0.350–4.500)

## 2019-04-11 ENCOUNTER — Telehealth (HOSPITAL_COMMUNITY): Payer: Self-pay

## 2019-04-11 DIAGNOSIS — I5042 Chronic combined systolic (congestive) and diastolic (congestive) heart failure: Secondary | ICD-10-CM

## 2019-04-11 MED ORDER — TORSEMIDE 20 MG PO TABS: ORAL_TABLET | ORAL | 3 refills | Status: DC

## 2019-04-11 NOTE — Telephone Encounter (Signed)
-----   Message from Larey Dresser, MD sent at 04/11/2019  3:17 PM EDT ----- BUN/creatinine up, drop torsemide back to 60 qam/40 qpm.  Have him get BMET in 10 days along with TSH, free T3 and free T4 given elevated TSH.

## 2019-04-11 NOTE — Telephone Encounter (Signed)
Spoke with wife, aware of lab results. Per Dr Aundra Dubin, torsemide dose changed to 60mg  qam/ 40mg  qpm.  Labs to be drawn in 10 days as directed. Wife verbalized understanding.

## 2019-04-21 ENCOUNTER — Ambulatory Visit (HOSPITAL_COMMUNITY)
Admission: RE | Admit: 2019-04-21 | Discharge: 2019-04-21 | Disposition: A | Discharge status: Home / Self Care | Payer: Medicare Other | Source: Ambulatory Visit | Attending: Cardiology | Admitting: Cardiology

## 2019-04-21 ENCOUNTER — Other Ambulatory Visit: Payer: Self-pay

## 2019-04-21 DIAGNOSIS — I5042 Chronic combined systolic (congestive) and diastolic (congestive) heart failure: Secondary | ICD-10-CM | POA: Diagnosis not present

## 2019-04-21 LAB — BASIC METABOLIC PANEL
Anion gap: 13 (ref 5–15)
BUN: 51 mg/dL — ABNORMAL HIGH (ref 8–23)
CO2: 25 mmol/L (ref 22–32)
Calcium: 9.1 mg/dL (ref 8.9–10.3)
Chloride: 103 mmol/L (ref 98–111)
Creatinine, Ser: 1.95 mg/dL — ABNORMAL HIGH (ref 0.61–1.24)
GFR calc Af Amer: 38 mL/min — ABNORMAL LOW (ref >60)
GFR calc non Af Amer: 33 mL/min — ABNORMAL LOW (ref >60)
Glucose, Bld: 157 mg/dL — ABNORMAL HIGH (ref 70–99)
Potassium: 4.8 mmol/L (ref 3.5–5.1)
Sodium: 141 mmol/L (ref 135–145)

## 2019-04-21 LAB — TSH: TSH: 3.683 u[IU]/mL (ref 0.350–4.500)

## 2019-04-21 LAB — T4, FREE: Free T4: 1.2 ng/dL (ref 0.82–1.77)

## 2019-04-22 ENCOUNTER — Telehealth (HOSPITAL_COMMUNITY): Payer: Self-pay

## 2019-04-22 DIAGNOSIS — I5032 Chronic diastolic (congestive) heart failure: Secondary | ICD-10-CM

## 2019-04-22 LAB — T3, FREE: T3, Free: 2.1 pg/mL (ref 2.0–4.4)

## 2019-04-22 MED ORDER — TORSEMIDE 20 MG PO TABS: 80.0000 mg | ORAL_TABLET | Freq: Every day | ORAL | 5 refills | Status: DC

## 2019-04-22 NOTE — Telephone Encounter (Signed)
-----   Message from Larey Dresser, MD sent at 04/21/2019  1:17 PM EDT ----- Hold torsemide x 2 days then decrease dose to 80 mg daily.  BMET in 1 week.

## 2019-04-22 NOTE — Telephone Encounter (Signed)
Creatinine 1.95.  Spoke with patient and wife, aware of lab results.  Advised to hold torsemide x2 days and resume at 80mg  daily.  Will repeat blood work next Tuesday. Verbalized understanding of all.

## 2019-04-23 ENCOUNTER — Ambulatory Visit (HOSPITAL_COMMUNITY)
Admission: RE | Admit: 2019-04-23 | Discharge: 2019-04-23 | Disposition: A | Discharge status: Home / Self Care | Payer: Medicare Other | Source: Ambulatory Visit | Attending: Cardiology | Admitting: Cardiology

## 2019-04-23 ENCOUNTER — Other Ambulatory Visit: Payer: Self-pay

## 2019-04-23 ENCOUNTER — Encounter (HOSPITAL_COMMUNITY): Payer: Self-pay

## 2019-04-23 DIAGNOSIS — I5042 Chronic combined systolic (congestive) and diastolic (congestive) heart failure: Secondary | ICD-10-CM | POA: Diagnosis not present

## 2019-04-23 MED ORDER — ICOSAPENT ETHYL 1 G PO CAPS: 2.0000 g | ORAL_CAPSULE | Freq: Two times a day (BID) | ORAL | 0 refills | Status: DC

## 2019-04-23 MED ORDER — CARVEDILOL 25 MG PO TABS: 12.5000 mg | ORAL_TABLET | Freq: Two times a day (BID) | ORAL | 3 refills | Status: DC

## 2019-04-23 MED ORDER — TORSEMIDE 20 MG PO TABS: 80.0000 mg | ORAL_TABLET | Freq: Once | ORAL | 2 refills | Status: DC

## 2019-04-23 NOTE — Progress Notes (Signed)
Heart Failure TeleHealth Note  Due to national recommendations of social distancing due to [place] 19, Audio/video telehealth visit is felt to be most appropriate for this patient at this time.  See MyChart message from today for patient consent regarding telehealth for [name].  Date:  04/23/2019   ID:  [name], DOB 11-17-1946, MRN [id number]  Location: Home  Provider location: [place] [facility] Type of Visit: Established patient  PCP:  [name], MD  Cardiologist:  [name]  Chief Complaint: Shortness of breath   History of Present Illness: [name] is a 73 y.o. male who presents via audio/video conferencing for a telehealth visit today.     he denies symptoms worrisome for COVID 19.   Patient has a past medical history significant for morbid obesity, systolic heart failure with an EF of 30-35% (January 2014) -> 20-25% (March 2015 - RV normal), VT on amio, CAD s/p CABG 1996, LBBB, COPD, obstructive sleep apnea on CPAP, AAA repair (January 2015).   Admitted to [facility] July 24 through July 06 2014 with chest pain.  CEs negative. Had a LHC with no change from previous LHC with recommendations to continue medical management. Discharge weight was 255 pounds.   LHC 07/06/14 --No significant change from previous studies.  Left anterior descending (LAD): The LAD is a large vessel. There is a 40% stenosis immediately after the takeoff of a large septal perforator. There are 2 large diagonal branches without significant disease. Left circumflex (LCx): The LCx is occluded proximally. There are left to left collaterals.  Right coronary artery (RCA): The RCA is occluded proximally immediately following the conus branch. There are right to right and left to right collaterals. SVGs from CABG known to be totally occluded.   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   CTA chest/abd/pelvis (3/16) with moderate emphysema, 1.3 cm nodule RUL, left SFA  totally occluded, right SFA with significant stenosis, s/p endovascular AAA repair (stable).   Echo (5/17) with EF 20-25%, severe LV dilation, mild MR, normal RV size with mildly decreased systolic function.   He has a RUL nodule that is most likely lung cancer, he is being treated with radiation.   Echo (8/18) with EF 30%, basal-mid inferior and inferolateral AK, severe LV dilation, normal RV, mild MR.   RHC/LHC was done in 1/19 due to worsening exertional dyspnea.  This showed normal filling pressures and preserved cardiac output.  There was 30-40% mLAD stenosis.  The LAD provided collaterals to the LCx and RCA territories.  The LCx, RCA, SVG-OM, and SVG-PDA were all chronically occluded (known from the past).  No new disease.   [facility] with 0.78 on right, 0.59 on left => left iliac > 50% stenosis.  11/19 ABIs 0.78 right, 0.59 left.  3/20 ABIs 0.74 right, 0.66 left.   He saw neurology about his tremor.  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  However, he is on amiodarone which may be contributing.  He has been seeing Dr. Vertell Limber with plan for deep brain stimulator, on hold currently due to cancer treatment.   He had chest radiation for RLL lung cancer.  However, CT showed new RUL lesion in 11/19 and he restart XRT in 12/19, repeat XRT is now completed.   Since his last round of radiation, he has felt more fatigued.  He feels like he just has not recovered from it.  At last appointment, he was volume overloaded  and I increased his torsemide. REDS vest reading after increasing torsemide was 31%.  He developed AKI and I had to cut it back down.  He is not taking torsemide for the next 2 days and will restart torsemide 80 mg daily on Friday.  In terms of breathing, he says that it has improved, but he still has good days and bad days.  Able to get around his house without dyspnea.  No chest pain.  He is lightheaded if he bends over.  BP today 86/50, generally runs in 33X-OVA 919T  systolic.    Labs 3/15 Cr 1.5 K 5.6 Labs 07/06/14 K 3.9 Creatinine  0.98 Labs 9/15 K 5.1, creatinine 0.96 Labs 11/15 K 3.8, creatinine 0.83, LDL 61, LFTs normal, TSH normal Labs 11/26/14 K 5.0 Creatinine 0.73 , LFTs normal, TSH normal,  Labs 12/08/14 K 3.4 Creatinine 0.83  Labs 3/16 K 4.4, creatinine 1.03, LFTs normal, TSH normal, HCT 37.8, BNP 65 Labs 6/16 K 4.1, creatinine 0.93, TSH normal, LFTs normal Labs 11/16 K 5.1, creatinine 1.13, LFTs normal, TSH normal, LDL 83, HDL 33, TGs 162 Labs 1/17 K 4.4, creatinine 0.86, pro-BNP 154 Labs 2/17 BNP 113, K 3.8, creatinine 0.79, TSH normal, LFTs normal Labs 4/17 K 3.8, creatinine 0.91, HCT 48.1 Labs 6/17 K 3.5, creatinine 0.98 Labs 9/17 K 3.9, creatinine 0.79, LDL 69, HDL 29, TSH normal, LFTs normal Labs 3/18 K 3.6, creatinine 0.87 Labs 8/18 K 3.3 => 4.2, creatinine 0.78 => 1.04, LDL 66, HDL 38, BNP 166 Labs 9/18: K 4.7, creatinine 0.9, BNP 144 Labs 11/18: K 4.8, creatinine 1.05, TSH normal, LFTs normal.  Labs 1/19: K 4.4, creatinine 1.08, LFTs normal Labs 5/19: LDL 60, HDL 28, TSH normal, LFTs normal, K 4.6, creatinine 1.43 Labs 11/19: K 4.6, creatinine 1.42, LFTs normal, TSH normal, Mg 2.3 Labs 1/20: TSH normal, K 4.2, creatinine 1.12, LFTs normal Labs 4/20: LDL 42, HDL 28, TGs 234 Labs 5/20: K 4.8, creatinine 1.95, TSH normal, LFTs normal  Past Medical History:  Diagnosis Date  . AAA (abdominal aortic aneurysm) ([place])    followed by Dr. Bridgett Larsson  . Adenomatous colon polyp 01/2004  . Anemia    hx  . Automatic implantable cardioverter-defibrillator in situ   . AVM (arteriovenous malformation)   . BPH (benign prostatic hypertrophy)   . CAD (coronary artery disease)    s/p CABGx2 in 1996  . Carotid artery stenosis    LCEA - Dr. Bridgett Larsson in 2013  . CHF (congestive heart failure) ([place])   . Complication of anesthesia    claustrophobic, unabe to lie on back more than 4 hours at time due to back  . COPD (chronic obstructive pulmonary disease)  ([place])   . Diabetes mellitus    DIET CONTROLLED- pt states that this was a misdiagnosis, he was treated while in the hospital  but returned home, loss a massive amount of weight and he has not had a problem with his blood sugar since. States everything has been normal for about 3 years.  . Diverticulosis   . Dyspnea   . Dysrhythmia    ICD-defibrillator  . Fatigue   . GERD (gastroesophageal reflux disease)   . H/O hiatal hernia   . History of radiation therapy 04/09/17-04/17/17   SBRT right lung 54 Gy in 3 fractions  . HLD (hyperlipidemia)   . Hypertension   . Hypothyroidism   . Ischemic cardiomyopathy   . Myocardial infarction ([place])   . NSCL ca   . OSA on  CPAP    AHI durign total sleep 14.69/hr, during REM 50.91/hr  . Peripheral vascular disease (H[facility])    LCEA, L renal artery stent, bilat iliac stents, R SFA stenosis  . Tremor   . Ventricular tachycardia ([place]) 09/09/2014   Amiodarone was started after appropriate defibrillator shocks for ventricular tachycardia in October 2008   Past Surgical History:  Procedure Laterality Date  . ABDOMINAL AORTIC ENDOVASCULAR STENT GRAFT [name] 01/06/2014   Procedure: ABDOMINAL AORTIC ENDOVASCULAR STENT GRAFT- GORE; ULTRASOUND GUIDED;  Surgeon: Conrad Cibecue, MD;  Location: [place];  Service: Vascular;  Laterality: [name];  . ANGIOPLASTY     BILATERAL  LE  W/STENTS  . BI-VENTRICULAR IMPLANTABLE CARDIOVERTER DEFIBRILLATOR UPGRADE [name] 10/09/2014   Procedure: BI-VENTRICULAR IMPLANTABLE CARDIOVERTER DEFIBRILLATOR UPGRADE;  Surgeon: Evans Lance, MD;  Location: [facility] CATH LAB;  Service: Cardiovascular;  Laterality: [name];  . BIV ICD GENERTAOR CHANGE OUT  10/09/2014   UPGRADE TO BIV        BY DR Lovena Le  . 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 Adora Fridge)  . CARDIAC CATHETERIZATION   10/03/2002   SVG sequentially to OM1 & OM2 totally occluded at ostium, SVG to PDA totally occluded within previously placed prox vein graft stent, smal distal AAA, bialt iliac stents with 30% left end-stent restenosis and 50% right end-stent restnosis(Dr. Gerrie Nordmann)  . CARDIAC CATHETERIZATION  06/11/1998   L main with 20% narrowing in distal 1/3; LAD with 1st diagonla having 70% ostial narrowing, 2nd diagonal with 40% narrowing in prox third, LIMA & RIMA widely patent; in-stent restenosis of RCIA with successful PTA and new prox SVTRCA stent for residual disease (Dr. Marella Chimes)  . CARDIAC DEFIBRILLATOR PLACEMENT  06/2005   [employer] Vitality HE - ischemic cardiomyopathy - Dr. Marella Chimes  . CAROTID ENDARTERECTOMY Left 01/04/12  . CORONARY ANGIOPLASTY  01/08/2004   cutting balloon atherectomy & percutaneous intervention of RCIA in-stent restenosis (Dr. Marella Chimes)  . CORONARY ARTERY BYPASS GRAFT  11/04/1985   x2 - PDA and sequential DX-OM (Dr. Redmond Pulling)  . ENDARTERECTOMY  01/04/2012   Procedure: ENDARTERECTOMY CAROTID;left  Surgeon: Hinda Lenis, MD;  Location: [place];  Service: Vascular;  Laterality: Left;  with patch angioplasty  . FUDUCIAL PLACEMENT [name] 02/23/2017   Procedure: PLACEMENT OF FUDUCIAL;  Surgeon: [name], MD;  Location: [place];  Service: Thoracic;  Laterality: [name];  . ICD GENERATOR CHANGE  05/02/2010   [employer], [employer]  . ILIAC ARTERY STENT Bilateral 03/1997   and L SFA PTA  . Iron infusion  June 16, 2012  . LEFT HEART CATHETERIZATION WITH CORONARY ANGIOGRAM [name] 07/06/2014   Procedure: LEFT HEART CATHETERIZATION WITH CORONARY ANGIOGRAM;  Surgeon: Peter M Martinique, MD;  Location: [facility] CATH LAB;  Service: Cardiovascular;  Laterality: [name];  . NM MYOCAR PERF WALL MOTION  09/2012   lexiscan myoview - mod-severe perfusion defect r/t infarct or scar w/mild periinfarct ishcemia in basal inferior, mid inferior, apical inferior, basal inferolateral & mid inferoalteral  regions - EF 21% low risk scan  . POLYPECTOMY    . RENAL ARTERY STENT Left 03/24/2004   6x37m Genesis stent (Dr. RMarella Chimes  . RIGHT/LEFT HEART CATH AND CORONARY ANGIOGRAPHY [name] 12/28/2017   Procedure: RIGHT/LEFT HEART CATH AND CORONARY ANGIOGRAPHY;  Surgeon: MLarey Dresser MD;  [facility]  INVAS[facility] CV [facility];  Service: Cardiovascular;  Laterality: [name];  . TRANSTHORACIC ECHOCARDIOGRAM  12/2012   EF 30-35; LV mod to severely dilated, mod concentric hypertrophy, severe hypokinesis of inferolateral myocardium, moderate hypokineis of anteroseptal region, grade 1 diastolic dysfunction; mod MR; LA mod-severely dialted; RV mod dialted; RA mildly dilated; PA peak pressure 44mHg  . VIDEO BRONCHOSCOPY WITH ENDOBRONCHIAL NAVIGATION [name] 02/23/2017   Procedure: VIDEO BRONCHOSCOPY WITH ENDOBRONCHIAL NAVIGATION;  Surgeon: SMelrose Nakayama MD;  Location[place]  Service: Thoracic;  Laterality: [name];     Current Outpatient Medications  Medication Sig Dispense Refill  . albuterol (PROVENTIL HFA;VENTOLIN HFA) 108 (90 BASE) MCG/ACT inhaler Inhale 2 puffs into the lungs every 6 (six) hours as needed for wheezing or shortness of breath. 3 Inhaler 4  . albuterol (PROVENTIL) (2.5 MG/3ML) 0.083% nebulizer solution Take 3 mLs (2.5 mg total) by nebulization every 6 (six) hours. 120 mL 3  . amiodarone (PACERONE) 200 MG tablet Take 0.5 tablets (100 mg total) by mouth daily. 45 tablet 3  . arformoterol (BROVANA) 15 MCG/2ML NEBU Take 2 mLs (15 mcg total) by nebulization 2 (two) times daily. 120 mL 5  . aspirin 81 MG tablet Take 81 mg by mouth daily.    .Marland Kitchenatorvastatin (LIPITOR) 80 MG tablet Take 1 tablet (80 mg total) by mouth daily. 30 tablet 6  . ATROVENT HFA 17 MCG/ACT inhaler TAKE 2 PUFFS BY MOUTH EVERY 4 HOURS AS NEEDED FOR WHEEZE  12  . budesonide (PULMICORT) 0.5 MG/2ML nebulizer solution Take 2 mLs (0.5 mg total) by nebulization 2 (two) times daily. 120 mL 5  . carvedilol (COREG) 25 MG tablet Take 0.5 tablets  (12.5 mg total) by mouth 2 (two) times daily with a meal. 45 tablet 3  . diazepam (VALIUM) 5 MG tablet 1-2 tabs every 8 hours if needed for tremor 30 tablet 5  . ENTRESTO 49-51 MG TAKE 1 TABLET BY MOUTH TWICE A DAY 60 tablet 6  . fluticasone (FLONASE) 50 MCG/ACT nasal spray Instill 1 spray in each  nostril daily 32 g 3  . Glycopyrrolate (LONHALA MAGNAIR REFILL KIT) 25 MCG/ML SOLN Inhale 1 Act into the lungs 2 (two) times daily. 60 mL 11  . Icosapent Ethyl 1 g CAPS Take 2 capsules (2 g total) by mouth 2 (two) times a day. 180 capsule 0  . isosorbide mononitrate (IMDUR) 60 MG 24 hr tablet TAKE 1 TABLET BY MOUTH  DAILY 90 tablet 3  . KLOR-CON M20 20 MEQ tablet TAKE 1 TABLET BY MOUTH EVERY DAY 90 tablet 2  . levothyroxine (SYNTHROID, LEVOTHROID) 50 MCG tablet TAKE 1 TABLET BY MOUTH  DAILY BEFORE BREAKFAST 90 tablet 0  . nitroGLYCERIN (NITROSTAT) 0.4 MG SL tablet DISSOLVE 1 TABLET UNDER THE TONGUE EVERY 5 MINUTES AS  NEEDED FOR CHEST PAIN , MAX 3 TABLETS IN 15 MIN, CALL  911 IF CHEST PAIN PERSISTS 25 tablet 0  . NON FORMULARY at bedtime. CPAP And during the day as needed    . rivaroxaban (XARELTO) 2.5 MG TABS tablet Take 1 tablet (2.5 mg total) by mouth 2 (two) times daily. 60 tablet 3  . spironolactone (ALDACTONE) 25 MG tablet TAKE 1 TABLET BY MOUTH  DAILY 90 tablet 3  . torsemide (DEMADEX) 20 MG tablet Take 4 tablets (80 mg total) by mouth once for 1 dose. ON FRIDAYS ONLY 90 tablet 2   No current facility-administered medications for this encounter.     Allergies:   Tussionex pennkinetic er [[employer]  er] and Ace inhibitors   Social History:  The patient  reports that he quit smoking about 16 years ago. His smoking use included cigarettes and pipe. He has a 60.00 pack-year smoking history. He has never used smokeless tobacco. He reports that he does not drink alcohol or use drugs.   Family History:  The patient's family history includes Cancer in his mother and sister; Colon cancer  in his sister; Diabetes in his daughter, mother, and sister; Heart attack in his father and maternal grandmother; Heart disease in his father, mother, and sister; Hyperlipidemia in his mother and sister; Hypertension in his father, mother, and sister; Lung cancer in his mother; Parkinson's disease in his sister.   ROS:  Please see the history of present illness.   All other systems are personally reviewed and negative.   Exam:  (Video/Tele Health Call; Exam is subjective and or/visual.) BP 86/50, P 70, T 95.1 General:  Speaks in full sentences. No resp difficulty. JVP not elevated Lungs: Normal respiratory effort with conversation.  Abdomen: Non-distended per patient report Extremities: Pt denies edema. Neuro: Alert & oriented x 3.   Recent Labs: 09/09/2018: Pro B Natriuretic peptide (BNP) 85.0 10/15/2018: Magnesium 2.3 04/10/2019: ALT 12; Hemoglobin 13.7; Platelets 154 04/21/2019: BUN 51; Creatinine, Ser 1.95; Potassium 4.8; Sodium 141; TSH 3.683  Personally reviewed   Wt Readings from Last 3 Encounters:  04/04/19 119.3 kg (263 lb)  02/11/19 124.7 kg (275 lb)  02/11/19 124.2 kg (273 lb 11.2 oz)      ASSESSMENT AND PLAN:  1. Chronic systolic CHF: Ischemic cardiomyopathy. Last echo in 8/18 showed EF 30% with severe LV dilation and normal RV.  [employer] CRT-D.  RHC in 1/19 showed normal filling pressures and preserved cardiac output.  NYHA class III symptoms, volume status appears to be better now but creatinine up to 1.95.   BP has been soft.  - He is going to hold torsemide until Friday then resume at lower dose, 80 mg daily.  BMET next week.  - With low BP, I will decrease Coreg to 12.5 mg bid.   - Continue Entresto to 49/51 bid.     - Continue spironolactone 25 mg daily.  - I will arrange for repeat echo at followup in 2 months.  2. CAD s/p CABG: No chest pain. [place] 1/19 with stable anatomy (LAD is patent, other vessels occluded with collaterals from the LAD). - Continue 81  mg aspirin daily and atorvastatin. - Continue Xarelto 2.5 mg bid.  3. VT s/p ICD: On amiodarone for history of ICD discharges due to VT.   - LFTs and TSH recently normal. Knows he needs yearly eye exams.   - Given increased tremor, I have cut back amiodarone to 100 mg daily. However, given multiple ICD discharges in the past, I do not want to stop it totally.  4. Morbid obesity: Still needs to work on diet/weight loss.  5. COPD: Moderate to severe.  I think that a lot of his dyspnea is related to COPD.   - Followup with pulmonary. - Now has home oxygen.  6. Lung cancer: He has completed radiation.   7. PAD: Occluded left SFA and significant right SFA stenosis on last CTA.  ABIs in 3/20 with significant disease bilaterally but stable.  Does not get leg pain with exertion but has nocturnal cramps.  [facility] by [facility], medical management for now.  He is on statin with optimized LDL.  8. AAA: s/p repair.  Followed at  [facility].  No endoleak on last evaluation.  9. Tremor: Most likely essential tremor given family history.  However, cannot rule out role for amiodarone. As above, I think that he needs to continue at least a low dose of amiodarone.  Dr. Carles Collet with neurology discussed deep brain stimulation => this is on hold while he is being treated for lung cancer.   10. Hyperlipidemia: Good LDL on atorvastatin but triglycerides high.  - I will have him start Vascepa 2 g bid. Lipids 2 months.   COVID screen The patient does not have any symptoms that suggest any further testing/ screening at this time.  Social distancing reinforced today.  Patient Risk: After full review of this patients clinical status, I feel that they are at moderate risk for cardiac decompensation at this time.  Relevant cardiac medications were reviewed at length with the patient today. The patient does not have concerns regarding their medications at this time.   Recommended follow-up:  2 months with echo  Today, I have spent 18  minutes with the patient with telehealth technology discussing the above issues .    Signed, [name], MD  04/23/2019  [place] [address] [facility] [place] [address] [phone] (office) [phone] (fax)

## 2019-04-25 ENCOUNTER — Telehealth (HOSPITAL_COMMUNITY): Payer: Medicare Other | Admitting: Cardiology

## 2019-04-29 ENCOUNTER — Ambulatory Visit (HOSPITAL_COMMUNITY)
Admission: RE | Admit: 2019-04-29 | Discharge: 2019-04-29 | Disposition: A | Discharge status: Home / Self Care | Payer: Medicare Other | Source: Ambulatory Visit | Attending: Internal Medicine | Admitting: Internal Medicine

## 2019-04-29 ENCOUNTER — Other Ambulatory Visit: Payer: Self-pay

## 2019-04-29 DIAGNOSIS — I5032 Chronic diastolic (congestive) heart failure: Secondary | ICD-10-CM | POA: Diagnosis not present

## 2019-04-29 LAB — BASIC METABOLIC PANEL
Anion gap: 9 (ref 5–15)
BUN: 36 mg/dL — ABNORMAL HIGH (ref 8–23)
CO2: 28 mmol/L (ref 22–32)
Calcium: 9.3 mg/dL (ref 8.9–10.3)
Chloride: 103 mmol/L (ref 98–111)
Creatinine, Ser: 1.38 mg/dL — ABNORMAL HIGH (ref 0.61–1.24)
GFR calc Af Amer: 58 mL/min — ABNORMAL LOW (ref >60)
GFR calc non Af Amer: 50 mL/min — ABNORMAL LOW (ref >60)
Glucose, Bld: 129 mg/dL — ABNORMAL HIGH (ref 70–99)
Potassium: 4.4 mmol/L (ref 3.5–5.1)
Sodium: 140 mmol/L (ref 135–145)

## 2019-05-11 ENCOUNTER — Other Ambulatory Visit (HOSPITAL_COMMUNITY): Payer: Self-pay | Admitting: Cardiology

## 2019-05-16 ENCOUNTER — Other Ambulatory Visit (HOSPITAL_COMMUNITY): Payer: Self-pay | Admitting: Cardiology

## 2019-05-17 ENCOUNTER — Other Ambulatory Visit (HOSPITAL_COMMUNITY): Payer: Self-pay | Admitting: Internal Medicine

## 2019-05-20 ENCOUNTER — Ambulatory Visit (INDEPENDENT_AMBULATORY_CARE_PROVIDER_SITE_OTHER): Payer: Medicare Other | Admitting: *Deleted

## 2019-05-20 DIAGNOSIS — I429 Cardiomyopathy, unspecified: Secondary | ICD-10-CM

## 2019-05-20 DIAGNOSIS — I5032 Chronic diastolic (congestive) heart failure: Secondary | ICD-10-CM

## 2019-05-20 DIAGNOSIS — I428 Other cardiomyopathies: Secondary | ICD-10-CM

## 2019-05-20 LAB — CUP PACEART REMOTE DEVICE CHECK
Battery Remaining Longevity: 66 mo
Battery Remaining Percentage: 91 %
Brady Statistic RA Percent Paced: 0 %
Brady Statistic RV Percent Paced: 90 %
Date Time Interrogation Session: 20200609072000
HighPow Impedance: 49 Ohm
Implantable Lead Implant Date: 20060411
Implantable Lead Implant Date: 20151030
Implantable Lead Implant Date: 20151030
Implantable Lead Location: 753858
Implantable Lead Location: 753859
Implantable Lead Location: 753860
Implantable Lead Model: 148
Implantable Lead Model: 4136
Implantable Lead Serial Number: 145070
Implantable Lead Serial Number: 29713415
Implantable Pulse Generator Implant Date: 20151030
Lead Channel Impedance Value: 591 Ohm
Lead Channel Impedance Value: 608 Ohm
Lead Channel Impedance Value: 657 Ohm
Lead Channel Setting Pacing Amplitude: 2 V
Lead Channel Setting Pacing Amplitude: 2 V
Lead Channel Setting Pacing Amplitude: 3.5 V
Lead Channel Setting Pacing Pulse Width: 0.4 ms
Lead Channel Setting Pacing Pulse Width: 0.9 ms
Lead Channel Setting Sensing Sensitivity: 0.6 mV
Lead Channel Setting Sensing Sensitivity: 1 mV
Pulse Gen Serial Number: 370874

## 2019-05-22 ENCOUNTER — Other Ambulatory Visit: Payer: Self-pay | Admitting: Radiation Oncology

## 2019-05-22 DIAGNOSIS — R911 Solitary pulmonary nodule: Secondary | ICD-10-CM

## 2019-05-29 NOTE — Progress Notes (Signed)
Remote ICD transmission.   

## 2019-06-01 ENCOUNTER — Encounter: Payer: Self-pay | Admitting: Internal Medicine

## 2019-06-03 ENCOUNTER — Other Ambulatory Visit: Payer: Self-pay

## 2019-06-03 ENCOUNTER — Ambulatory Visit (INDEPENDENT_AMBULATORY_CARE_PROVIDER_SITE_OTHER): Payer: Medicare Other

## 2019-06-03 ENCOUNTER — Encounter: Payer: Self-pay | Admitting: Internal Medicine

## 2019-06-03 ENCOUNTER — Ambulatory Visit (INDEPENDENT_AMBULATORY_CARE_PROVIDER_SITE_OTHER): Payer: Medicare Other | Admitting: Internal Medicine

## 2019-06-03 VITALS — BP 118/60 | HR 74 | Temp 97.8°F | Ht 66.75 in | Wt 267.4 lb

## 2019-06-03 DIAGNOSIS — J9611 Chronic respiratory failure with hypoxia: Secondary | ICD-10-CM | POA: Diagnosis not present

## 2019-06-03 DIAGNOSIS — R918 Other nonspecific abnormal finding of lung field: Secondary | ICD-10-CM

## 2019-06-03 DIAGNOSIS — C3490 Malignant neoplasm of unspecified part of unspecified bronchus or lung: Secondary | ICD-10-CM | POA: Diagnosis not present

## 2019-06-03 DIAGNOSIS — Z9989 Dependence on other enabling machines and devices: Secondary | ICD-10-CM

## 2019-06-03 DIAGNOSIS — I255 Ischemic cardiomyopathy: Secondary | ICD-10-CM

## 2019-06-03 DIAGNOSIS — J449 Chronic obstructive pulmonary disease, unspecified: Secondary | ICD-10-CM | POA: Diagnosis not present

## 2019-06-03 DIAGNOSIS — G4733 Obstructive sleep apnea (adult) (pediatric): Secondary | ICD-10-CM

## 2019-06-03 MED ORDER — TRELEGY ELLIPTA 100-62.5-25 MCG/INH IN AEPB: 1.0000 | INHALATION_SPRAY | Freq: Every day | RESPIRATORY_TRACT | 0 refills | Status: DC

## 2019-06-03 NOTE — Progress Notes (Signed)
HPI 73 year old male former smoker followed for dyspnea/COPD, lung cancer/ XRT, OSA complicated by CAD/CHF/MI/AICD/CM/AAA, AICD, morbid obesity DM 2 HBP, GERD, tremor/ deep brainstimulator NPSG 11/05/07- [place]- AHI 14.6/ hr, desaturation to 84%, body weight 279 lbs PET scan 10/25/2018-  progressive increased soft tissue within the right upper lobe suspicious for recurrent tumor. -------------------------------------------------------------------  11/29/18- 73 year old male former smoker followed for dyspnea/COPD, lung nodules/ XRT, OSA complicated by CAD/CHF/MI/AICD/CM/AAA, morbid obesity,  DM 2 HBP, GERD, tremor/ deep brain stimulator CPAP 12 / [employer]  O2 2L Download 97% compliance AHI 1.3/hour -----Cpap is doing well and mask is fitting well. Having Sob but his base line.  Albuterol HFA, neb albuterol/ budesonide, Atrovent HFA, He starts new round of XRT for apparently progressive lung cancer at end of December. Deep brain stimulator has been postponed several times because of cancer status. He continues with CPAP and oxygen.  Using oxygen between 2.5 and 3 L. Valium helps some with tremor control. Little routine cough or wheeze but activity is very limited. PET scan 10/25/2018-  IMPRESSION: 1. There is abnormal increased radiotracer uptake localizing to the progressive increased soft tissue within the right upper lobe suspicious for recurrent tumor. 2. Mild increased radiotracer uptake is noted localizing to the posterior left upper lobe nodular density with SUV max of 2.16, nonspecific. 3. Aortic Atherosclerosis (ICD10-I70.0) and Emphysema (ICD10-J43.9).  06/03/2019- 73 year old male former smoker followed for dyspnea/COPD, lung nodules/ XRT, OSA complicated by CAD/CHF/MI/AICD/CM/AAA, morbid obesity,  DM 2 HBP, GERD, tremor/ deep brain stimulator CPAP 12 / [employer]/ [facility]  O2 2L. Likes his CPAP. Download 97% compliance, AHI 1.2/ hr Cardiology continues to follow for CM/ dCHF Rad/Onc  continues to follow for RUL nodule/ mass after XRT(2 rounds), empirically Rxd as CA w/o BX. -----OSA on CPAP  Doing well w CPAP. Unclear why DME hasn't bled in his O2. Gets very SOB w trivial exertion of ADLs when off O2- given permission to stay on O2 . Inhalers work well. Needs a maintenance inhaler- will sample Trelegy  ROS-see HPI    + = positive Constitutional:  +  weight loss, night sweats, fevers, chills, fatigue, lassitude. HEENT:   No-  headaches, difficulty swallowing, tooth/dental problems, sore throat,       No-  sneezing, itching, ear ache, nasal congestion, post nasal drip,  CV:  No-   chest pain, +orthopnea, PND, +swelling in lower extremities, No-anasarca, dizziness, palpitations Resp: + shortness of breath with exertion or at rest.              No-   productive cough,  No non-productive cough,  No- coughing up of blood.              No-   change in color of mucus.  No- wheezing.   Skin: No-   rash or lesions. GI:  No-   heartburn, indigestion, abdominal pain, nausea, vomiting, GU: . MS:  + joint pain or swelling.  Neuro-     nothing unusual Psych:  No- change in mood or affect. No depression or anxiety.  No memory loss.  OBJ- Physical Exam   97% on O2 3L. +Wheelchair  General- Alert, Oriented, Affect-appropriate, Distress- none acute, + morbidly obese  Skin-+ ecchymoses on arms. Lymphadenopathy- none Head- atraumatic            Eyes- Gross vision intact, PERRLA, conjunctivae and secretions clear            Ears- Hearing, canals-normal  Nose- clear, no-Septal dev, mucus, polyps, erosion, perforation             Throat- Mallampati III, mucosa clear , drainage- none, tonsils- atrophic Neck- flexible , trachea midline, no stridor , thyroid nl, carotid no bruit Chest - symmetrical excursion , unlabored           Heart/CV- RRR , no murmur , no gallop  , no rub, nl s1 s2                           - JVD- none , edema -none, stasis changes- none, varices- none            Lung-+unlabored shallow inspiratory effort, rales- none, wheeze+, cough- none ,                         dullness-none, rub- none,            Chest wall- sternotomy scar,  Pacemaker defibrillator L Abd-  Br/ Gen/ Rectal- Not done, not indicated Extrem- cyanosis- none, clubbing, none, atrophy- none, strength- nl, vein donor scars Neuro- +head bob tremor

## 2019-06-03 NOTE — Assessment & Plan Note (Signed)
He continues to benefit from CPAP with good compliance and control Plan- continue CPAP 12, bleed in his O2 at 3L during sleep.

## 2019-06-03 NOTE — Assessment & Plan Note (Signed)
Persistent wheeze Plan try adding maintenance inhaler Trelegy sample

## 2019-06-03 NOTE — Assessment & Plan Note (Signed)
He is dependent on O2 Plan- Continue O2 2.5-3L continuous and portable

## 2019-06-03 NOTE — Patient Instructions (Signed)
Order- CXR    Dx XRT for RuL mass  Order- [employer]- please help patient connect his O2 3L through his CPAP while sleeping  Dx chronic hypoxic respiratory failure, lung cancer  Order- Sample Trelegy inhaler sample    Inhale 1 puff, then rinse mouth, once daily  Please call if we can help

## 2019-06-04 ENCOUNTER — Telehealth: Payer: Self-pay | Admitting: *Deleted

## 2019-06-04 NOTE — Telephone Encounter (Signed)
XXXX 

## 2019-06-04 NOTE — Telephone Encounter (Signed)
Received a call back from [facility] Dr. Clabe Seal nurse. She informed they are working on scheduling CT and will contact pt. Called [name] patient's spouse back and informed. Nothing further needed at this time.

## 2019-06-04 NOTE — Telephone Encounter (Signed)
CALLED PATIENT TO INFORM OF CT FOR  06-11-19 - ARRIVAL TIME- 1:15 PM @ [facility], NO RESTRICTIONS TO TEST, PT. TO FU WITH DR. KINARD ON 06-12-19 @ 10:30 AM FOR RESULTS, SPOKE WITH PATIENT'S WIFE AND SHE IS AWARE OF THESE APPTS.

## 2019-06-06 ENCOUNTER — Other Ambulatory Visit: Payer: Self-pay | Admitting: Internal Medicine

## 2019-06-07 ENCOUNTER — Encounter: Payer: Self-pay | Admitting: Radiation Oncology

## 2019-06-11 ENCOUNTER — Encounter (HOSPITAL_COMMUNITY): Payer: Self-pay

## 2019-06-11 ENCOUNTER — Other Ambulatory Visit (HOSPITAL_COMMUNITY): Payer: Self-pay | Admitting: Cardiology

## 2019-06-11 ENCOUNTER — Ambulatory Visit (HOSPITAL_COMMUNITY)
Admission: RE | Admit: 2019-06-11 | Discharge: 2019-06-11 | Disposition: A | Discharge status: Home / Self Care | Payer: Medicare Other | Source: Ambulatory Visit | Attending: Radiation Oncology | Admitting: Radiation Oncology

## 2019-06-11 ENCOUNTER — Other Ambulatory Visit: Payer: Self-pay

## 2019-06-11 DIAGNOSIS — C3490 Malignant neoplasm of unspecified part of unspecified bronchus or lung: Secondary | ICD-10-CM | POA: Diagnosis not present

## 2019-06-11 DIAGNOSIS — R911 Solitary pulmonary nodule: Secondary | ICD-10-CM | POA: Diagnosis not present

## 2019-06-12 ENCOUNTER — Encounter: Payer: Self-pay | Admitting: Radiation Oncology

## 2019-06-12 ENCOUNTER — Ambulatory Visit
Admission: RE | Admit: 2019-06-12 | Discharge: 2019-06-12 | Disposition: A | Discharge status: Home / Self Care | Payer: Medicare Other | Source: Ambulatory Visit | Attending: Radiation Oncology | Admitting: Radiation Oncology

## 2019-06-12 ENCOUNTER — Other Ambulatory Visit: Payer: Self-pay

## 2019-06-12 VITALS — BP 104/51 | HR 77 | Temp 99.6°F | Resp 24 | Ht 66.75 in | Wt 267.6 lb

## 2019-06-12 DIAGNOSIS — J439 Emphysema, unspecified: Secondary | ICD-10-CM | POA: Diagnosis not present

## 2019-06-12 DIAGNOSIS — Z08 Encounter for follow-up examination after completed treatment for malignant neoplasm: Secondary | ICD-10-CM | POA: Diagnosis not present

## 2019-06-12 DIAGNOSIS — Z7901 Long term (current) use of anticoagulants: Secondary | ICD-10-CM | POA: Insufficient documentation

## 2019-06-12 DIAGNOSIS — I700 Atherosclerosis of aorta: Secondary | ICD-10-CM | POA: Diagnosis not present

## 2019-06-12 DIAGNOSIS — R911 Solitary pulmonary nodule: Secondary | ICD-10-CM | POA: Diagnosis not present

## 2019-06-12 DIAGNOSIS — Z7982 Long term (current) use of aspirin: Secondary | ICD-10-CM | POA: Insufficient documentation

## 2019-06-12 DIAGNOSIS — Z79899 Other long term (current) drug therapy: Secondary | ICD-10-CM | POA: Diagnosis not present

## 2019-06-12 DIAGNOSIS — Z8709 Personal history of other diseases of the respiratory system: Secondary | ICD-10-CM | POA: Diagnosis not present

## 2019-06-12 NOTE — Progress Notes (Signed)
Pt presents today for f/u with Dr. Sondra Come. Pt had a CT chest yesterday and is here with wife to review results of imaging. Pt states recently that he has had a more difficult time coughing up sputum. Pt reports clear sputum with cough. Pt denies hemoptysis. Pt is audibly SOB. Pt reports SOB interrupts ADLs and needs to stop personal care "4 or 5 times to rest with CPAP". Pt denies difficulty swallowing.   BP (!) 104/51 (BP Location: Left Arm, Patient Position: Sitting)   Pulse 77   Temp 99.6 F (37.6 C) (Oral)   Resp (!) 24   Ht 5' 6.75" (1.695 m)   Wt 267 lb 9.6 oz (121.4 kg)   SpO2 99%   BMI 42.23 kg/m   Wt Readings from Last 3 Encounters:  06/12/19 267 lb 9.6 oz (121.4 kg)  06/03/19 267 lb 6.4 oz (121.3 kg)  04/04/19 263 lb (119.3 kg)   Loma Sousa, RN BSN

## 2019-06-12 NOTE — Patient Instructions (Signed)
Coronavirus (COVID-19) Are you at risk?  Are you at risk for the Coronavirus (COVID-19)?  To be considered HIGH RISK for Coronavirus (COVID-19), you have to meet the following criteria:  . Traveled to [place], [place], [place], [place] or [place]; or in the [place] to [place], [place], [place], or [place]; and have fever, cough, and shortness of breath within the last 2 weeks of travel OR . Been in close contact with a person diagnosed with COVID-19 within the last 2 weeks and have fever, cough, and shortness of breath . IF YOU DO NOT MEET THESE CRITERIA, YOU ARE CONSIDERED LOW RISK FOR COVID-19.  What to do if you are HIGH RISK for COVID-19?  . If you are having a medical emergency, call 911. . Seek medical care right away. Before you go to a doctor's office, urgent care or emergency department, call ahead and tell them about your recent travel, contact with someone diagnosed with COVID-19, and your symptoms. You should receive instructions from your physician's office regarding next steps of care.  . When you arrive at healthcare provider, tell the healthcare staff immediately you have returned from visiting [place], [place], [place], [place] or [place]; or traveled in the [place] to [place], [place], [place], or [place]; in the last two weeks or you have been in close contact with a person diagnosed with COVID-19 in the last 2 weeks.   . Tell the health care staff about your symptoms: fever, cough and shortness of breath. . After you have been seen by a medical provider, you will be either: o Tested for (COVID-19) and discharged home on quarantine except to seek medical care if symptoms worsen, and asked to  - Stay home and avoid contact with others until you get your results (4-5 days)  - Avoid travel on public transportation if possible (such as bus, train, or airplane) or o Sent to the Emergency Department by EMS for evaluation, COVID-19 testing, and possible  admission depending on your condition and test results.  What to do if you are LOW RISK for COVID-19?  Reduce your risk of any infection by using the same precautions used for avoiding the common cold or flu:  . Wash your hands often with soap and warm water for at least 20 seconds.  If soap and water are not readily available, use an alcohol-based hand sanitizer with at least 60% alcohol.  . If coughing or sneezing, cover your mouth and nose by coughing or sneezing into the elbow areas of your shirt or coat, into a tissue or into your sleeve (not your hands). . Avoid shaking hands with others and consider head nods or verbal greetings only. . Avoid touching your eyes, nose, or mouth with unwashed hands.  . Avoid close contact with people who are sick. . Avoid places or events with large numbers of people in one location, like concerts or sporting events. . Carefully consider travel plans you have or are making. . If you are planning any travel outside or inside the [place], visit the CDC's Travelers' Health webpage for the latest health notices. . If you have some symptoms but not all symptoms, continue to monitor at home and seek medical attention if your symptoms worsen. . If you are having a medical emergency, call 911.   ADDITIONAL HEALTHCARE OPTIONS FOR PATIENTS  [facility] / e-Visit: [email or web address]         [facility]: [phone]  [facility]: [phone]                   [facility]: [phone]   

## 2019-06-12 NOTE — Progress Notes (Addendum)
Radiation Oncology         [phone] ________________________________  Name: [name] MRN: [id number]  Date: 06/12/2019  DOB: 28-Feb-1946  Follow-Up Visit Note  CC: [name], MD  [name], MD    ICD-10-CM   1. Nodule of right lung  R91.1 NM PET Image Restag (PS) Skull Base To Thigh    Diagnosis:   Presumptive Right Upper Lobe Recurrence  Interval Since Last Radiation:  5 months  12/09/2018 - 01/20/2019: Right upper lung (retx) / 60 Gy in 30 fractions (IMRT)  2/25, 2/27, 02/08/18: Right lower lung / 54 Gy in 3 fractions (SBRT)  4/30, 5/3, 04/17/17: Right upper lung / 54 Gy in 3 fractions (SBRT)  Narrative:  The patient returns today for routine follow-up. He is accompanied by his wife after completion of his treatment to the right upper lung the patient developed significant fatigue and worsening of his breathing.  Has recently been placed on Trelegy which has helped significantly with his wheezing and breathing.  He however fatigues easily exertion.  He denies any pain in the chest area significant cough or hemoptysis  Since his last visit, he underwent chest CT yesterday, 06/11/2019. This revealed: progressive changes of external beam radiation within the right upper lobe; left upper lobe lung lesion has clearly increased in size and appear more solid on today's study, worrisome for progressive disease.     ALLERGIES:  is allergic to tussionex pennkinetic er [hydrocod polst-cpm polst er] and ace inhibitors.  Meds: Current Outpatient Medications  Medication Sig Dispense Refill   albuterol (PROVENTIL HFA;VENTOLIN HFA) 108 (90 BASE) MCG/ACT inhaler Inhale 2 puffs into the lungs every 6 (six) hours as needed for wheezing or shortness of breath. 3 Inhaler 4   albuterol (PROVENTIL) (2.5 MG/3ML) 0.083% nebulizer solution Take 3 mLs (2.5 mg total) by nebulization every 6 (six) hours. 120 mL 3   amiodarone (PACERONE) 200 MG tablet Take 0.5 tablets (100 mg total) by  mouth daily. 45 tablet 3   arformoterol (BROVANA) 15 MCG/2ML NEBU Take 2 mLs (15 mcg total) by nebulization 2 (two) times daily. 120 mL 5   aspirin 81 MG tablet Take 81 mg by mouth daily.     atorvastatin (LIPITOR) 80 MG tablet Take 1 tablet (80 mg total) by mouth daily. 30 tablet 6   ATROVENT HFA 17 MCG/ACT inhaler TAKE 2 PUFFS BY MOUTH EVERY 4 HOURS AS NEEDED FOR WHEEZE  12   budesonide (PULMICORT) 0.5 MG/2ML nebulizer solution Take 2 mLs (0.5 mg total) by nebulization 2 (two) times daily. 120 mL 5   carvedilol (COREG) 25 MG tablet Take 0.5 tablets (12.5 mg total) by mouth 2 (two) times daily with a meal. 45 tablet 3   diazepam (VALIUM) 5 MG tablet 1-2 tabs every 8 hours if needed for tremor 30 tablet 5   ENTRESTO 49-51 MG TAKE 1 TABLET BY MOUTH TWICE A DAY 60 tablet 6   fluticasone (FLONASE) 50 MCG/ACT nasal spray Instill 1 spray in each  nostril daily 32 g 3   Fluticasone-Umeclidin-Vilant (TRELEGY ELLIPTA) 100-62.5-25 MCG/INH AEPB Inhale 1 puff into the lungs daily. 1 each 0   Glycopyrrolate (LONHALA MAGNAIR REFILL KIT) 25 MCG/ML SOLN Inhale 1 Act into the lungs 2 (two) times daily. 60 mL 11   Icosapent Ethyl 1 g CAPS Take 2 capsules (2 g total) by mouth 2 (two) times a day. 180 capsule 0   isosorbide mononitrate (IMDUR) 60 MG 24 hr tablet TAKE 1  TABLET BY MOUTH  DAILY 90 tablet 3   KLOR-CON M20 20 MEQ tablet TAKE 1 TABLET BY MOUTH EVERY DAY 90 tablet 2   levothyroxine (SYNTHROID) 50 MCG tablet TAKE 1 TABLET BY MOUTH  DAILY BEFORE BREAKFAST 90 tablet 0   nitroGLYCERIN (NITROSTAT) 0.4 MG SL tablet DISSOLVE 1 TABLET UNDER THE TONGUE EVERY 5 MINUTES AS  NEEDED FOR CHEST PAIN , MAX 3 TABLETS IN 15 MIN, CALL  911 IF CHEST PAIN PERSISTS 25 tablet 0   NON FORMULARY at bedtime. CPAP And during the day as needed     spironolactone (ALDACTONE) 25 MG tablet TAKE 1 TABLET BY MOUTH  DAILY 90 tablet 3   torsemide (DEMADEX) 20 MG tablet Take 4 tablets (80 mg total) by mouth once for 1  dose. ON FRIDAYS ONLY 90 tablet 2   XARELTO 2.5 MG TABS tablet TAKE 1 TABLET BY MOUTH TWICE A DAY 60 tablet 3   No current facility-administered medications for this encounter.     Physical Findings: The patient is in no acute distress. Patient is alert and oriented.  height is 5' 6.75" (1.695 m) and weight is 267 lb 9.6 oz (121.4 kg). His oral temperature is 99.6 F (37.6 C). His blood pressure is 104/51 (abnormal) and his pulse is 77. His respiration is 24 (abnormal) and oxygen saturation is 99%.  Lungs are clear to auscultation bilaterally. Heart has regular rate and rhythm. No palpable cervical, supraclavicular, or axillary adenopathy. Abdomen soft, non-tender, normal bowel sounds. Remains in a wheelchair for evaluation  Lab Findings: Lab Results  Component Value Date   WBC 6.7 04/10/2019   HGB 13.7 04/10/2019   HCT 43.4 04/10/2019   MCV 96.4 04/10/2019   PLT 154 04/10/2019    Radiographic Findings: Dg Chest 2 View  Result Date: 06/03/2019 CLINICAL DATA:  73 year old male with history of lung cancer, suspicion of recurrent right upper lobe tumor on 2019 PET. EXAM: CHEST - 2 VIEW COMPARISON:  PET-CT 10/24/2018 and earlier. FINDINGS: Chronic left chest AICD. Cardiac and mediastinal contours remain stable. Stable left lung. No pneumothorax, pulmonary edema or pleural effusion. Increasing thickness of linear opacity extending from the right hilum into the upper lobe, approximately 20 millimeters now versus 10-12 millimeters in September. Interval decreased size of a superimposed right infrahilar nodular density. No new right lung opacity. No acute osseous abnormality identified. Radiographic artifact in the upper abdomen. IMPRESSION: 1. Radiographic progression of the right upper lobe opacity since 2019 further increasing suspicion of recurrent tumor. 2. No new cardiopulmonary abnormality. Electronically Signed   By: Genevie Ann M.D.   On: 06/03/2019 15:49   Ct Chest Wo Contrast  Result  Date: 06/11/2019 CLINICAL DATA:  Non-small cell lung cancer follow-up EXAM: CT CHEST WITHOUT CONTRAST TECHNIQUE: Multidetector CT imaging of the chest was performed following the standard protocol without IV contrast. COMPARISON:  09/16/2018 FINDINGS: Cardiovascular: The heart size appears within normal limits. No pericardial effusion identified. Aortic atherosclerosis. Previous median sternotomy with CABG procedure. Mediastinum/Nodes: Normal appearance of the thyroid gland. The trachea appears patent and is midline. Normal appearance of the esophagus. No mediastinal or hilar adenopathy. Lungs/Pleura: Moderately advanced changes of emphysema. Progressive changes of external beam radiation within the right upper lobe are identified including fibrosis and masslike architectural distortion. The previously referenced area of increased soft tissue centrally measures 5.1 by 2.0 cm, image 50/7. Previously 3.8 x 1.9 cm. The second area more inferiorly and medially in the right upper lobe is no longer measurable. This  may be obscured by progressive changes due to external beam radiation or resolved. Within the posterior left upper lobe there is a nodule which is becoming progressively more solid measuring 1.4 x 0.9 by 1.3 cm,, image 38/7. PReviously this measured 1.2 x 0.7 by 0.8 cm. Upper Abdomen: No acute abnormality. Musculoskeletal: Spondylosis noted within the thoracic spine. No aggressive lytic or sclerotic bone lesions. IMPRESSION: 1. Progressive changes of external beam radiation within the right upper lobe. The previously described central area of increased soft tissue is mildly increased from 09/16/2018. The appearance is nonspecific in may reflect progressive changes of external beam radiation. The second area reported previously up progressive increased soft tissue is no longer visualized and may have resolved or is obscured by progressive radiation changes 2. Left upper lobe lung lesion has clearly increased in  size and appears more solid on today's study and is worrisome for progressive disease. 3. Aortic Atherosclerosis (ICD10-I70.0) and Emphysema (ICD10-J43.9). Electronically Signed   By: Kerby Moors M.D.   On: 06/11/2019 15:16    Impression:   Presumptive Right Upper Lobe Recurrence.  Recent chest CT scan shows radiation changes in the area of retreatment with no obvious active tumor.  Unfortunately the patient appears to have a progressive lesion in the left lung which is ~ 1.4 cm in size or solid c/w with malignancy.  The patient would be unable to obtain a biopsy at this area given his performance status and lung situation.  We discussed options for management including close follow-up repeating PET scan or proceeding with stereotactic body radiation therapy for the left lung lesion.  This time the patient and his wife feel most comfortable with proceeding with a PET scan for further evaluation.  I will order this study and then will speak with him over the phone to discuss and decide whether stereotactic body radiation therapy is indicated.  Plan: Patient will be scheduled for PET scan in the near future with management details depending on PET scan.  ____________________________________ Gery Pray, MD   This document serves as a record of services personally performed by Gery Pray, MD. It was created on his behalf by Wilburn Mylar, a trained medical scribe. The creation of this record is based on the scribe's personal observations and the provider's statements to them. This document has been checked and approved by the attending provider.

## 2019-06-20 ENCOUNTER — Other Ambulatory Visit (HOSPITAL_COMMUNITY): Payer: Self-pay | Admitting: Cardiology

## 2019-07-02 ENCOUNTER — Other Ambulatory Visit (HOSPITAL_COMMUNITY): Payer: Self-pay | Admitting: Cardiology

## 2019-07-08 ENCOUNTER — Ambulatory Visit (HOSPITAL_COMMUNITY)
Admission: RE | Admit: 2019-07-08 | Discharge: 2019-07-08 | Disposition: A | Discharge status: Home / Self Care | Payer: Medicare Other | Source: Ambulatory Visit | Attending: Cardiology | Admitting: Cardiology

## 2019-07-08 ENCOUNTER — Other Ambulatory Visit: Payer: Self-pay

## 2019-07-08 ENCOUNTER — Encounter (HOSPITAL_COMMUNITY): Payer: Self-pay | Admitting: Cardiology

## 2019-07-08 ENCOUNTER — Ambulatory Visit (HOSPITAL_BASED_OUTPATIENT_CLINIC_OR_DEPARTMENT_OTHER)
Admission: RE | Admit: 2019-07-08 | Discharge: 2019-07-08 | Disposition: A | Discharge status: Home / Self Care | Payer: Medicare Other | Source: Ambulatory Visit | Attending: Cardiology | Admitting: Cardiology

## 2019-07-08 VITALS — BP 110/84 | HR 73 | Wt 270.0 lb

## 2019-07-08 DIAGNOSIS — E785 Hyperlipidemia, unspecified: Secondary | ICD-10-CM | POA: Insufficient documentation

## 2019-07-08 DIAGNOSIS — I5042 Chronic combined systolic (congestive) and diastolic (congestive) heart failure: Secondary | ICD-10-CM

## 2019-07-08 DIAGNOSIS — Z7982 Long term (current) use of aspirin: Secondary | ICD-10-CM | POA: Insufficient documentation

## 2019-07-08 DIAGNOSIS — Z8249 Family history of ischemic heart disease and other diseases of the circulatory system: Secondary | ICD-10-CM | POA: Diagnosis not present

## 2019-07-08 DIAGNOSIS — I5022 Chronic systolic (congestive) heart failure: Secondary | ICD-10-CM | POA: Diagnosis not present

## 2019-07-08 DIAGNOSIS — I2510 Atherosclerotic heart disease of native coronary artery without angina pectoris: Secondary | ICD-10-CM | POA: Insufficient documentation

## 2019-07-08 DIAGNOSIS — J449 Chronic obstructive pulmonary disease, unspecified: Secondary | ICD-10-CM | POA: Diagnosis not present

## 2019-07-08 DIAGNOSIS — Z79890 Hormone replacement therapy (postmenopausal): Secondary | ICD-10-CM | POA: Insufficient documentation

## 2019-07-08 DIAGNOSIS — I110 Hypertensive heart disease with heart failure: Secondary | ICD-10-CM | POA: Diagnosis not present

## 2019-07-08 DIAGNOSIS — Z79899 Other long term (current) drug therapy: Secondary | ICD-10-CM | POA: Diagnosis not present

## 2019-07-08 DIAGNOSIS — Z7951 Long term (current) use of inhaled steroids: Secondary | ICD-10-CM | POA: Insufficient documentation

## 2019-07-08 DIAGNOSIS — N400 Enlarged prostate without lower urinary tract symptoms: Secondary | ICD-10-CM | POA: Diagnosis not present

## 2019-07-08 DIAGNOSIS — I447 Left bundle-branch block, unspecified: Secondary | ICD-10-CM | POA: Diagnosis not present

## 2019-07-08 DIAGNOSIS — I352 Nonrheumatic aortic (valve) stenosis with insufficiency: Secondary | ICD-10-CM | POA: Insufficient documentation

## 2019-07-08 DIAGNOSIS — G4733 Obstructive sleep apnea (adult) (pediatric): Secondary | ICD-10-CM | POA: Diagnosis not present

## 2019-07-08 DIAGNOSIS — R251 Tremor, unspecified: Secondary | ICD-10-CM | POA: Insufficient documentation

## 2019-07-08 DIAGNOSIS — Z7901 Long term (current) use of anticoagulants: Secondary | ICD-10-CM | POA: Insufficient documentation

## 2019-07-08 DIAGNOSIS — K219 Gastro-esophageal reflux disease without esophagitis: Secondary | ICD-10-CM | POA: Insufficient documentation

## 2019-07-08 DIAGNOSIS — Z86010 Personal history of colonic polyps: Secondary | ICD-10-CM | POA: Insufficient documentation

## 2019-07-08 DIAGNOSIS — E039 Hypothyroidism, unspecified: Secondary | ICD-10-CM | POA: Diagnosis not present

## 2019-07-08 DIAGNOSIS — I252 Old myocardial infarction: Secondary | ICD-10-CM | POA: Insufficient documentation

## 2019-07-08 DIAGNOSIS — C3431 Malignant neoplasm of lower lobe, right bronchus or lung: Secondary | ICD-10-CM | POA: Insufficient documentation

## 2019-07-08 DIAGNOSIS — Z95810 Presence of automatic (implantable) cardiac defibrillator: Secondary | ICD-10-CM | POA: Diagnosis not present

## 2019-07-08 DIAGNOSIS — Z951 Presence of aortocoronary bypass graft: Secondary | ICD-10-CM | POA: Insufficient documentation

## 2019-07-08 DIAGNOSIS — I739 Peripheral vascular disease, unspecified: Secondary | ICD-10-CM | POA: Insufficient documentation

## 2019-07-08 DIAGNOSIS — I255 Ischemic cardiomyopathy: Secondary | ICD-10-CM | POA: Diagnosis not present

## 2019-07-08 DIAGNOSIS — I714 Abdominal aortic aneurysm, without rupture: Secondary | ICD-10-CM | POA: Insufficient documentation

## 2019-07-08 DIAGNOSIS — Z87891 Personal history of nicotine dependence: Secondary | ICD-10-CM | POA: Insufficient documentation

## 2019-07-08 DIAGNOSIS — Z923 Personal history of irradiation: Secondary | ICD-10-CM | POA: Insufficient documentation

## 2019-07-08 LAB — LIPID PANEL
Cholesterol: 165 mg/dL (ref 0–200)
HDL: 38 mg/dL — ABNORMAL LOW (ref >40)
LDL Cholesterol: 97 mg/dL (ref 0–99)
Total CHOL/HDL Ratio: 4.3 RATIO
Triglycerides: 150 mg/dL — ABNORMAL HIGH (ref <150)
VLDL: 30 mg/dL (ref 0–40)

## 2019-07-08 LAB — BASIC METABOLIC PANEL
Anion gap: 12 (ref 5–15)
BUN: 34 mg/dL — ABNORMAL HIGH (ref 8–23)
CO2: 25 mmol/L (ref 22–32)
Calcium: 9.1 mg/dL (ref 8.9–10.3)
Chloride: 102 mmol/L (ref 98–111)
Creatinine, Ser: 1.22 mg/dL (ref 0.61–1.24)
GFR calc Af Amer: >60 mL/min (ref >60)
GFR calc non Af Amer: 58 mL/min — ABNORMAL LOW (ref >60)
Glucose, Bld: 112 mg/dL — ABNORMAL HIGH (ref 70–99)
Potassium: 4.1 mmol/L (ref 3.5–5.1)
Sodium: 139 mmol/L (ref 135–145)

## 2019-07-08 NOTE — Progress Notes (Signed)
  Echocardiogram 2D Echocardiogram has been performed.  Cadance Raus G Corrin Sieling 07/08/2019, 11:04 AM

## 2019-07-08 NOTE — Patient Instructions (Addendum)
Labs today We will only contact you if something comes back abnormal or we need to make some changes. Otherwise no news is good news!   Your physician recommends that you schedule a follow-up appointment in: 3 months with Dr Aundra Dubin   At the [facility], you and your health needs are our priority. As part of our continuing mission to provide you with exceptional heart care, we have created designated Provider Care Teams. These Care Teams include your primary Cardiologist (physician) and Advanced Practice Providers (APPs- Physician Assistants and Nurse Practitioners) who all work together to provide you with the care you need, when you need it.   You may see any of the following providers on your designated Care Team at your next follow up: Marland Kitchen Dr [name] . Dr [name] . Darrick Grinder, NP

## 2019-07-08 NOTE — Progress Notes (Signed)
Date:  07/08/2019   ID:  [name], DOB [birth year], MRN [id number]    Provider location: [facility] Type of Visit: Established patient  PCP:  Tyrone Lima, MD  Cardiologist:  Dr. Aundra Small  Chief Complaint: Shortness of breath   History of Present Illness: [name] is a 73 y.o. male who has a past medical history significant for morbid obesity, systolic heart failure with an EF of 30-35% (January 2014) -> 20-25% (March 2015 - RV normal), VT on amio, CAD s/p CABG 1996, LBBB, COPD, obstructive sleep apnea on CPAP, AAA repair (January 2015).   Admitted to [facility] July 24 through July 06 2014 with chest pain.  CEs negative. Had a LHC with no change from previous LHC with recommendations to continue medical management. Discharge weight was 255 pounds.   LHC 07/06/14 --No significant change from previous studies.  Left anterior descending (LAD): The LAD is a large vessel. There is a 40% stenosis immediately after the takeoff of a large septal perforator. There are 2 large diagonal branches without significant disease. Left circumflex (LCx): The LCx is occluded proximally. There are left to left collaterals.  Right coronary artery (RCA): The RCA is occluded proximally immediately following the conus branch. There are right to right and left to right collaterals. SVGs from CABG known to be totally occluded.   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   CTA chest/abd/pelvis (3/16) with moderate emphysema, 1.3 cm nodule RUL, left SFA totally occluded, right SFA with significant stenosis, s/p endovascular AAA repair (stable).   Echo (5/17) with EF 20-25%, severe LV dilation, mild MR, normal RV size with mildly decreased systolic function.   He has a RUL nodule that is most likely lung cancer, he is being treated with radiation.   Echo (8/18) with EF 30%, basal-mid inferior and inferolateral AK, severe LV dilation, normal RV, mild MR.   RHC/LHC was  done in 1/19 due to worsening exertional dyspnea.  This showed normal filling pressures and preserved cardiac output.  There was 30-40% mLAD stenosis.  The LAD provided collaterals to the LCx and RCA territories.  The LCx, RCA, SVG-OM, and SVG-PDA were all chronically occluded (known from the past).  No new disease.   [facility] with 0.78 on right, 0.59 on left => left iliac > 50% stenosis.  11/19 ABIs 0.78 right, 0.59 left.  3/20 ABIs 0.74 right, 0.66 left.   He saw neurology about his tremor.  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  However, he is on amiodarone which may be contributing.  He has been seeing Dr. Vertell Small with plan for deep brain stimulator, on hold currently due to cancer treatment.   He had chest radiation for RLL lung cancer.  However, CT showed new RUL lesion in 11/19 and he restart XRT in 12/19, repeat XRT is now completed.   Last CT chest showed a lesion in the left upper lobe, PET scan has been ordered.   Echo was done today and reviewed. EF 35-40%, basal to mid inferolateral and inferior akinesis, mildly decreased RV systolic function, mild AS, PASP 22 mmHg.    He returns for followup of CHF and CAD.  He has stable dyspnea when walking a relatively short distance.  No dyspnea at rest.  He is wearing home oxygen now.  No orthopnea/PND.  No chest pain.  BP has been stable, no lightheadedness.  Wheezing has improved with use of Trelegy  inhaler.    [employer] device interrogation: 92% BiV pacing, no AF or VT.    Labs 3/15 Cr 1.5 K 5.6 Labs 07/06/14 K 3.9 Creatinine  0.98 Labs 9/15 K 5.1, creatinine 0.96 Labs 11/15 K 3.8, creatinine 0.83, LDL 61, LFTs normal, TSH normal Labs 11/26/14 K 5.0 Creatinine 0.73 , LFTs normal, TSH normal,  Labs 12/08/14 K 3.4 Creatinine 0.83  Labs 3/16 K 4.4, creatinine 1.03, LFTs normal, TSH normal, HCT 37.8, BNP 65 Labs 6/16 K 4.1, creatinine 0.93, TSH normal, LFTs normal Labs 11/16 K 5.1, creatinine 1.13, LFTs normal, TSH  normal, LDL 83, HDL 33, TGs 162 Labs 1/17 K 4.4, creatinine 0.86, pro-BNP 154 Labs 2/17 BNP 113, K 3.8, creatinine 0.79, TSH normal, LFTs normal Labs 4/17 K 3.8, creatinine 0.91, HCT 48.1 Labs 6/17 K 3.5, creatinine 0.98 Labs 9/17 K 3.9, creatinine 0.79, LDL 69, HDL 29, TSH normal, LFTs normal Labs 3/18 K 3.6, creatinine 0.87 Labs 8/18 K 3.3 => 4.2, creatinine 0.78 => 1.04, LDL 66, HDL 38, BNP 166 Labs 9/18: K 4.7, creatinine 0.9, BNP 144 Labs 11/18: K 4.8, creatinine 1.05, TSH normal, LFTs normal.  Labs 1/19: K 4.4, creatinine 1.08, LFTs normal Labs 5/19: LDL 60, HDL 28, TSH normal, LFTs normal, K 4.6, creatinine 1.43 Labs 11/19: K 4.6, creatinine 1.42, LFTs normal, TSH normal, Mg 2.3 Labs 1/20: TSH normal, K 4.2, creatinine 1.12, LFTs normal Labs 4/20: LDL 42, HDL 28, TGs 234 Labs 5/20: K 4.8 => 4.4, creatinine 1.95 => 1.38, TSH normal, LFTs normal  Past Medical History:  Diagnosis Date   AAA (abdominal aortic aneurysm) ([place])    followed by Dr. Bridgett Small   Adenomatous colon polyp 01/2004   Anemia    hx   Automatic implantable cardioverter-defibrillator in situ    AVM (arteriovenous malformation)    BPH (benign prostatic hypertrophy)    CAD (coronary artery disease)    s/p CABGx2 in 1996   Carotid artery stenosis    LCEA - Dr. Bridgett Small in 2013   CHF (congestive heart failure) (HCC)    Complication of anesthesia    claustrophobic, unabe to lie on back more than 4 hours at time due to back   COPD (chronic obstructive pulmonary disease) ([place])    Diabetes mellitus    DIET CONTROLLED- pt states that this was a misdiagnosis, he was treated while in the hospital  but returned home, loss a massive amount of weight and he has not had a problem with his blood sugar since. States everything has been normal for about 3 years.   Diverticulosis    Dyspnea    Dysrhythmia    ICD-defibrillator   Fatigue    GERD (gastroesophageal reflux disease)    H/O hiatal hernia    History of  radiation therapy 04/09/17-04/17/17   SBRT right lung 54 Gy in 3 fractions   HLD (hyperlipidemia)    Hypertension    Hypothyroidism    Ischemic cardiomyopathy    Myocardial infarction ([place])    NSCL ca    OSA on CPAP    AHI durign total sleep 14.69/hr, during REM 50.91/hr   Peripheral vascular disease (HCC)    LCEA, L renal artery stent, bilat iliac stents, R SFA stenosis   Tremor    Ventricular tachycardia ([place]) 09/09/2014   Amiodarone was started after appropriate defibrillator shocks for ventricular tachycardia in October 2008   Past Surgical History:  Procedure Laterality Date   ABDOMINAL AORTIC ENDOVASCULAR STENT GRAFT [name] 01/06/2014  Procedure: ABDOMINAL AORTIC ENDOVASCULAR STENT GRAFT- GORE; ULTRASOUND GUIDED;  Surgeon: Conrad Arnaudville, MD;  Location: [facility];  Service: Vascular;  Laterality: [name];   ANGIOPLASTY     BILATERAL  LE  W/STENTS   BI-VENTRICULAR IMPLANTABLE CARDIOVERTER DEFIBRILLATOR UPGRADE [name] 10/09/2014   Procedure: BI-VENTRICULAR IMPLANTABLE CARDIOVERTER DEFIBRILLATOR UPGRADE;  Surgeon: Evans Lance, MD;  Location[facility];  Service: Cardiovascular;  Laterality: [name];   BIV ICD GENERTAOR CHANGE OUT  10/09/2014   UPGRADE TO BIV        BY DR Lovena Le   CARDIAC CATHETERIZATION  09/16/2007   occlusion of both vein grafts, no significant LAD disease or diagonal disease, Cfx collaterals from the left, 70% in-stent restenosis of L renal artery, normal L main, RCA occluded ostially (Dr. Adora Fridge)   CARDIAC CATHETERIZATION  10/03/2002   SVG sequentially to [place] totally occluded at ostium, SVG to PDA totally occluded within previously placed prox vein graft stent, smal distal AAA, bialt iliac stents with 30% left end-stent restenosis and 50% right end-stent restnosis(Dr. Gerrie Nordmann)   CARDIAC CATHETERIZATION  06/11/1998   L main with 20% narrowing in distal 1/3; LAD with 1st diagonla having 70% ostial narrowing, 2nd diagonal with 40% narrowing in prox third, Small  & RIMA widely patent; in-stent restenosis of RCIA with successful PTA and new prox SVTRCA stent for residual disease (Dr. Marella Chimes)   [place]  06/2005   Guidant Vitality HE - ischemic cardiomyopathy - Dr. Marella Chimes   CAROTID ENDARTERECTOMY Left 01/04/12   CORONARY ANGIOPLASTY  01/08/2004   cutting balloon atherectomy & percutaneous intervention of RCIA in-stent restenosis (Dr. Marella Chimes)   CORONARY ARTERY BYPASS GRAFT  11/04/1985   x2 - PDA and sequential DX-OM (Dr. Redmond Pulling)   ENDARTERECTOMY  01/04/2012   Procedure: ENDARTERECTOMY CAROTID;left  Surgeon: Hinda Lenis, MD;  Location: [facility];  Service: Vascular;  Laterality: Left;  with patch angioplasty   FUDUCIAL PLACEMENT [name] 02/23/2017   Procedure: PLACEMENT OF FUDUCIAL;  Surgeon: [name], MD;  Location: [facility];  Service: Thoracic;  Laterality: [name];   ICD GENERATOR CHANGE  05/02/2010   [employer] Teligen   ILIAC ARTERY STENT Bilateral 03/1997   and L SFA PTA   Iron infusion  June 16, 2012   LEFT HEART CATHETERIZATION WITH CORONARY ANGIOGRAM [name] 07/06/2014   Procedure: LEFT HEART CATHETERIZATION WITH CORONARY ANGIOGRAM;  Surgeon: Peter M Martinique, MD;  Location: [facility] CATH LAB;  Service: Cardiovascular;  Laterality: [name];   NM MYOCAR PERF WALL MOTION  09/2012   lexiscan myoview - mod-severe perfusion defect r/t infarct or scar w/mild periinfarct ishcemia in basal inferior, mid inferior, apical inferior, basal inferolateral & mid inferoalteral regions - EF 21% low risk scan   POLYPECTOMY     RENAL ARTERY STENT Left 03/24/2004   6x52m Genesis stent (Dr. RMarella Chimes   RIGHT/LEFT HEART CATH AND CORONARY ANGIOGRAPHY [name] 12/28/2017   Procedure: RIGHT/LEFT HEART CATH AND CORONARY ANGIOGRAPHY;  Surgeon: MLarey Dresser MD;  [facility];  Service: Cardiovascular;  Laterality: [name];   TRANSTHORACIC ECHOCARDIOGRAM  12/2012   EF 30-35; LV mod to severely dilated, mod  concentric hypertrophy, severe hypokinesis of inferolateral myocardium, moderate hypokineis of anteroseptal region, grade 1 diastolic dysfunction; mod MR; LA mod-severely dialted; RV mod dialted; RA mildly dilated; PA peak pressure 337mg   VIDEO BRONCHOSCOPY WITH ENDOBRONCHIAL NAVIGATION [name] 02/23/2017   Procedure: VIDEO BRONCHOSCOPY WITH ENDOBRONCHIAL NAVIGATION;  Surgeon: StRevonda Standard  Roxan Hockey, MD;  Location: [facility];  Service: Thoracic;  Laterality: [name];     Current Outpatient Medications  Medication Sig Dispense Refill   albuterol (PROVENTIL HFA;VENTOLIN HFA) 108 (90 BASE) MCG/ACT inhaler Inhale 2 puffs into the lungs every 6 (six) hours as needed for wheezing or shortness of breath. 3 Inhaler 4   albuterol (PROVENTIL) (2.5 MG/3ML) 0.083% nebulizer solution Take 3 mLs (2.5 mg total) by nebulization every 6 (six) hours. 120 mL 3   amiodarone (PACERONE) 200 MG tablet Take 0.5 tablets (100 mg total) by mouth daily. 45 tablet 3   arformoterol (BROVANA) 15 MCG/2ML NEBU Take 2 mLs (15 mcg total) by nebulization 2 (two) times daily. 120 mL 5   aspirin 81 MG tablet Take 81 mg by mouth daily.     atorvastatin (LIPITOR) 80 MG tablet TAKE 1 TABLET BY MOUTH EVERY DAY 90 tablet 2   ATROVENT HFA 17 MCG/ACT inhaler TAKE 2 PUFFS BY MOUTH EVERY 4 HOURS AS NEEDED FOR WHEEZE  12   budesonide (PULMICORT) 0.5 MG/2ML nebulizer solution Take 2 mLs (0.5 mg total) by nebulization 2 (two) times daily. 120 mL 5   carvedilol (COREG) 25 MG tablet Take 0.5 tablets (12.5 mg total) by mouth 2 (two) times daily with a meal. 45 tablet 3   diazepam (VALIUM) 5 MG tablet 1-2 tabs every 8 hours if needed for tremor 30 tablet 5   ENTRESTO 49-51 MG TAKE 1 TABLET BY MOUTH TWICE A DAY 60 tablet 6   finasteride (PROSCAR) 5 MG tablet Take 5 mg by mouth daily.     fluticasone (FLONASE) 50 MCG/ACT nasal spray Instill 1 spray in each  nostril daily 32 g 3   Fluticasone-Umeclidin-Vilant (TRELEGY ELLIPTA) 100-62.5-25 MCG/INH  AEPB Inhale 1 puff into the lungs daily. 1 each 0   Glycopyrrolate (LONHALA MAGNAIR REFILL KIT) 25 MCG/ML SOLN Inhale 1 Act into the lungs 2 (two) times daily. 60 mL 11   isosorbide mononitrate (IMDUR) 60 MG 24 hr tablet TAKE 1 TABLET BY MOUTH  DAILY 90 tablet 3   KLOR-CON M20 20 MEQ tablet TAKE 1 TABLET BY MOUTH EVERY DAY 90 tablet 2   levothyroxine (SYNTHROID) 50 MCG tablet TAKE 1 TABLET BY MOUTH  DAILY BEFORE BREAKFAST 90 tablet 0   nitroGLYCERIN (NITROSTAT) 0.4 MG SL tablet DISSOLVE 1 TABLET UNDER THE TONGUE EVERY 5 MINUTES AS  NEEDED FOR CHEST PAIN , MAX 3 TABLETS IN 15 MIN, CALL  911 IF CHEST PAIN PERSISTS 25 tablet 0   NON FORMULARY at bedtime. CPAP And during the day as needed     spironolactone (ALDACTONE) 25 MG tablet TAKE 1 TABLET BY MOUTH  DAILY 90 tablet 3   torsemide (DEMADEX) 20 MG tablet Take 4 tablets (80 mg total) by mouth once for 1 dose. ON FRIDAYS ONLY 90 tablet 2   VASCEPA 1 g CAPS TAKE 2 CAPSULES (2 G TOTAL) BY MOUTH 2 (TWO) TIMES A DAY. 120 capsule 1   XARELTO 2.5 MG TABS tablet TAKE 1 TABLET BY MOUTH TWICE A DAY 60 tablet 3   No current facility-administered medications for this encounter.     Allergies:   Tussionex pennkinetic er [hydrocod polst-cpm polst er] and Ace inhibitors   Social History:  The patient  reports that he quit smoking about 16 years ago. His smoking use included cigarettes and pipe. He has a 60.00 pack-year smoking history. He has never used smokeless tobacco. He reports that he does not drink alcohol or use drugs.  Family History:  The patient's family history includes Cancer in his mother and sister; Colon cancer in his sister; Diabetes in his daughter, mother, and sister; Heart attack in his father and maternal grandmother; Heart disease in his father, mother, and sister; Hyperlipidemia in his mother and sister; Hypertension in his father, mother, and sister; Lung cancer in his mother; Parkinson's disease in his sister.   ROS:  Please  see the history of present illness.   All other systems are personally reviewed and negative.   Exam:   BP 110/84    Pulse 73    Wt 122.5 kg (270 lb)    SpO2 98% Comment: 3L of oxygen   BMI 42.61 kg/m  General: NAD Neck: No JVD, no thyromegaly or thyroid nodule.  Lungs: Distant BS CV: Nondisplaced PMI.  Heart regular S1/S2, no S3/S4, no murmur.  Trace ankle edema.  No carotid bruit.  Normal pedal pulses.  Abdomen: Soft, nontender, no hepatosplenomegaly, no distention.  Skin: Intact without lesions or rashes.  Neurologic: Alert and oriented x 3.  Psych: Normal affect. Extremities: No clubbing or cyanosis.  HEENT: Normal.    Recent Labs: 09/09/2018: Pro B Natriuretic peptide (BNP) 85.0 10/15/2018: Magnesium 2.3 04/10/2019: ALT 12; Hemoglobin 13.7; Platelets 154 04/21/2019: TSH 3.683 07/08/2019: BUN 34; Creatinine, Ser 1.22; Potassium 4.1; Sodium 139  Personally reviewed   Wt Readings from Last 3 Encounters:  07/08/19 122.5 kg (270 lb)  06/12/19 121.4 kg (267 lb 9.6 oz)  06/03/19 121.3 kg (267 lb 6.4 oz)      ASSESSMENT AND PLAN:  1. Chronic systolic CHF: Ischemic cardiomyopathy. Echo in 8/18 showed EF 30% with severe LV dilation and normal RV.  [employer] CRT-D.  RHC in 1/19 showed normal filling pressures and preserved cardiac output.  Echo was done today and reviewed, EF 35-40% with mildly decreased RV systolic function. NYHA class III symptoms, stable.  He is not volume overloaded; I think that his dyspnea is primarily lung related (COPD, lung radiation).  - Continue torsemide 80 mg daily with BMET today.  - Continue Coreg 12.5 mg bid.   - Continue Entresto to 49/51 bid.     - Continue spironolactone 25 mg daily.  2. CAD s/p CABG: No chest pain. [place] 1/19 with stable anatomy (LAD is patent, other vessels occluded with collaterals from the LAD). - Continue 81 mg aspirin daily and atorvastatin.  Check lipids today.  - Continue Xarelto 2.5 mg bid.  3. VT s/p ICD: On  amiodarone for history of ICD discharges due to VT.   - Recent TSH normal, check LFTs today. Knows he needs yearly eye exams.   - Given increased tremor, I cut back amiodarone to 100 mg daily. However, given multiple ICD discharges in the past, I do not want to stop it totally.  4. Morbid obesity: Still needs to work on diet/weight loss.  5. COPD: Moderate to severe.  I think that a lot of his dyspnea is related to COPD.  Trelegy has helped.  - Followup with pulmonary. - Now has home oxygen.  6. Lung cancer: He has completed radiation to right lung.  Most recent CT with lesion left upper lobe.  He needs a PET scan (ordered).  7. PAD: Occluded left SFA and significant right SFA stenosis on last CTA.  ABIs in 3/20 with significant disease bilaterally but stable.  Does not get leg pain with exertion but has nocturnal cramps.  Follow[facility] by [facility], medical management for now.  He is  on statin, need to check lipids today.  8. AAA: s/p repair.  Followed at [facility].  No endoleak on last evaluation.  9. Tremor: Most likely essential tremor given family history.  However, cannot rule out role for amiodarone. As above, I think that he needs to continue at least a low dose of amiodarone.  Dr. Carles Collet with neurology discussed deep brain stimulation => this is on hold while he is being treated for lung cancer.   10. Hyperlipidemia: He is on Vascepa and atorvastatin.  - Check lipids today.   Followup in 3 months.   Signed, [name], MD  07/08/2019  [facility] [address] [facility] [place] [address] [phone] (office) [phone] (fax)

## 2019-07-09 ENCOUNTER — Telehealth (HOSPITAL_COMMUNITY): Payer: Self-pay

## 2019-07-09 DIAGNOSIS — I739 Peripheral vascular disease, unspecified: Secondary | ICD-10-CM

## 2019-07-09 DIAGNOSIS — I5022 Chronic systolic (congestive) heart failure: Secondary | ICD-10-CM

## 2019-07-09 MED ORDER — EZETIMIBE 10 MG PO TABS: 10.0000 mg | ORAL_TABLET | Freq: Every day | ORAL | 3 refills | Status: DC

## 2019-07-09 NOTE — Telephone Encounter (Signed)
Patient and wife aware of results.  Medicine called into pharmacy.  Appt card mailed.

## 2019-07-09 NOTE — Telephone Encounter (Signed)
-----   Message from Larey Dresser, MD sent at 07/08/2019 11:16 PM EDT ----- Triglycerides better but LDL is too high.  Add Zetia 10 mg daily with lipids/LFTs in 2 months.

## 2019-07-17 ENCOUNTER — Telehealth: Payer: Self-pay | Admitting: *Deleted

## 2019-07-17 NOTE — Telephone Encounter (Signed)
Called patient to inform of Pet Scan on 07-23-19 - arrival time- 10:45 am, pt. to be NPO- 6 hrs.prior to test, spoke with patient's wife - [name] and she is aware of this test

## 2019-07-21 ENCOUNTER — Other Ambulatory Visit: Payer: Self-pay | Admitting: Cardiology

## 2019-07-23 ENCOUNTER — Other Ambulatory Visit: Payer: Self-pay

## 2019-07-23 ENCOUNTER — Ambulatory Visit (HOSPITAL_COMMUNITY)
Admission: RE | Admit: 2019-07-23 | Discharge: 2019-07-23 | Disposition: A | Discharge status: Home / Self Care | Payer: Medicare Other | Source: Ambulatory Visit | Attending: Radiation Oncology | Admitting: Radiation Oncology

## 2019-07-23 DIAGNOSIS — I700 Atherosclerosis of aorta: Secondary | ICD-10-CM | POA: Diagnosis not present

## 2019-07-23 DIAGNOSIS — N200 Calculus of kidney: Secondary | ICD-10-CM | POA: Insufficient documentation

## 2019-07-23 DIAGNOSIS — Z79899 Other long term (current) drug therapy: Secondary | ICD-10-CM | POA: Diagnosis not present

## 2019-07-23 DIAGNOSIS — K7460 Unspecified cirrhosis of liver: Secondary | ICD-10-CM | POA: Diagnosis not present

## 2019-07-23 DIAGNOSIS — R911 Solitary pulmonary nodule: Secondary | ICD-10-CM | POA: Diagnosis not present

## 2019-07-23 DIAGNOSIS — K8020 Calculus of gallbladder without cholecystitis without obstruction: Secondary | ICD-10-CM | POA: Insufficient documentation

## 2019-07-23 DIAGNOSIS — C3412 Malignant neoplasm of upper lobe, left bronchus or lung: Secondary | ICD-10-CM | POA: Diagnosis not present

## 2019-07-23 LAB — GLUCOSE, CAPILLARY: Glucose-Capillary: 86 mg/dL (ref 70–99)

## 2019-07-23 MED ORDER — FLUDEOXYGLUCOSE F - 18 (FDG) INJECTION
14.1600 | Freq: Once | INTRAVENOUS | Status: AC
  Administered 2019-07-23: 14.16 via INTRAVENOUS

## 2019-07-30 ENCOUNTER — Other Ambulatory Visit: Payer: Self-pay | Admitting: Cardiovascular Disease

## 2019-07-30 ENCOUNTER — Other Ambulatory Visit: Payer: Self-pay | Admitting: Internal Medicine

## 2019-07-30 ENCOUNTER — Other Ambulatory Visit (HOSPITAL_COMMUNITY): Payer: Self-pay | Admitting: Cardiology

## 2019-07-30 DIAGNOSIS — I5042 Chronic combined systolic (congestive) and diastolic (congestive) heart failure: Secondary | ICD-10-CM

## 2019-07-31 ENCOUNTER — Ambulatory Visit
Admission: RE | Admit: 2019-07-31 | Discharge: 2019-07-31 | Disposition: A | Discharge status: Home / Self Care | Payer: Medicare Other | Source: Ambulatory Visit | Attending: Radiation Oncology | Admitting: Radiation Oncology

## 2019-07-31 ENCOUNTER — Other Ambulatory Visit: Payer: Self-pay

## 2019-07-31 DIAGNOSIS — R918 Other nonspecific abnormal finding of lung field: Secondary | ICD-10-CM | POA: Diagnosis not present

## 2019-07-31 DIAGNOSIS — Z510 Encounter for antineoplastic radiation therapy: Secondary | ICD-10-CM | POA: Diagnosis not present

## 2019-07-31 DIAGNOSIS — R911 Solitary pulmonary nodule: Secondary | ICD-10-CM

## 2019-08-01 NOTE — Telephone Encounter (Signed)
This is a CHF pt Dr Aundra Dubin.

## 2019-08-06 DIAGNOSIS — R918 Other nonspecific abnormal finding of lung field: Secondary | ICD-10-CM | POA: Diagnosis not present

## 2019-08-06 DIAGNOSIS — Z510 Encounter for antineoplastic radiation therapy: Secondary | ICD-10-CM | POA: Diagnosis not present

## 2019-08-12 ENCOUNTER — Ambulatory Visit: Payer: Medicare Other | Admitting: Radiation Oncology

## 2019-08-13 ENCOUNTER — Other Ambulatory Visit (HOSPITAL_COMMUNITY): Payer: Self-pay | Admitting: Internal Medicine

## 2019-08-14 ENCOUNTER — Ambulatory Visit
Admission: RE | Admit: 2019-08-14 | Discharge: 2019-08-14 | Disposition: A | Discharge status: Home / Self Care | Payer: Medicare Other | Source: Ambulatory Visit | Attending: Radiation Oncology | Admitting: Radiation Oncology

## 2019-08-14 ENCOUNTER — Other Ambulatory Visit: Payer: Self-pay

## 2019-08-14 DIAGNOSIS — Z510 Encounter for antineoplastic radiation therapy: Secondary | ICD-10-CM | POA: Diagnosis not present

## 2019-08-14 DIAGNOSIS — R918 Other nonspecific abnormal finding of lung field: Secondary | ICD-10-CM | POA: Diagnosis not present

## 2019-08-14 DIAGNOSIS — R911 Solitary pulmonary nodule: Secondary | ICD-10-CM

## 2019-08-14 NOTE — Progress Notes (Signed)
  Radiation Oncology         [phone] ________________________________  Name: [name] MRN: [id number]  Date: 08/14/2019  DOB: 1946-01-08  Stereotactic Body Radiotherapy Treatment Procedure Note  NARRATIVE:  [name] was brought to the stereotactic radiation treatment machine and placed supine on the CT couch. The patient was set up for stereotactic body radiotherapy on the body fix pillow.  3D TREATMENT PLANNING AND DOSIMETRY:  The patient's radiation plan was reviewed and approved prior to starting treatment.  It showed 3-dimensional radiation distributions overlaid onto the planning CT.  The [facility] for the target structures as well as the organs at risk were reviewed. The documentation of this is filed in the radiation oncology EMR.  SIMULATION VERIFICATION:  The patient underwent CT imaging on the treatment unit.  These were carefully aligned to document that the ablative radiation dose would cover the target volume and maximally spare the nearby organs at risk according to the planned distribution.  SPECIAL TREATMENT PROCEDURE: [name] received high dose ablative stereotactic body radiotherapy to the planned target volume without unforeseen complications. Treatment was delivered uneventfully. The high doses associated with stereotactic body radiotherapy and the significant potential risks require careful treatment set up and patient monitoring constituting a special treatment procedure   STEREOTACTIC TREATMENT MANAGEMENT:  Following delivery, the patient was evaluated clinically. The patient tolerated treatment without significant acute effects, and was discharged to home in stable condition.    PLAN: Continue treatment as planned.  ________________________________  Blair Promise, PhD, MD   This document serves as a record of services personally performed by Gery Pray, MD. It was created on his behalf by Wilburn Mylar, a trained medical scribe. The  creation of this record is based on the scribe's personal observations and the provider's statements to them. This document has been checked and approved by the attending provider.

## 2019-08-19 ENCOUNTER — Ambulatory Visit: Payer: Medicare Other | Admitting: Radiation Oncology

## 2019-08-19 ENCOUNTER — Ambulatory Visit
Admission: RE | Admit: 2019-08-19 | Discharge: 2019-08-19 | Disposition: A | Discharge status: Home / Self Care | Payer: Medicare Other | Source: Ambulatory Visit | Attending: Radiation Oncology | Admitting: Radiation Oncology

## 2019-08-19 DIAGNOSIS — R911 Solitary pulmonary nodule: Secondary | ICD-10-CM

## 2019-08-19 DIAGNOSIS — R918 Other nonspecific abnormal finding of lung field: Secondary | ICD-10-CM | POA: Diagnosis not present

## 2019-08-19 DIAGNOSIS — Z510 Encounter for antineoplastic radiation therapy: Secondary | ICD-10-CM | POA: Diagnosis not present

## 2019-08-19 NOTE — Progress Notes (Signed)
  Radiation Oncology         [phone] ________________________________  Name: [name] MRN: [id number]  Date: 08/19/2019  DOB: Jan 18, 1946  Stereotactic Body Radiotherapy Treatment Procedure Note  NARRATIVE:  [name] was brought to the stereotactic radiation treatment machine and placed supine on the CT couch. The patient was set up for stereotactic body radiotherapy on the body fix pillow.  3D TREATMENT PLANNING AND DOSIMETRY:  The patient's radiation plan was reviewed and approved prior to starting treatment.  It showed 3-dimensional radiation distributions overlaid onto the planning CT.  The [facility] for the target structures as well as the organs at risk were reviewed. The documentation of this is filed in the radiation oncology EMR.  SIMULATION VERIFICATION:  The patient underwent CT imaging on the treatment unit.  These were carefully aligned to document that the ablative radiation dose would cover the target volume and maximally spare the nearby organs at risk according to the planned distribution.  SPECIAL TREATMENT PROCEDURE: [name] received high dose ablative stereotactic body radiotherapy to the planned target volume without unforeseen complications. Treatment was delivered uneventfully. The high doses associated with stereotactic body radiotherapy and the significant potential risks require careful treatment set up and patient monitoring constituting a special treatment procedure   STEREOTACTIC TREATMENT MANAGEMENT:  Following delivery, the patient was evaluated clinically. The patient tolerated treatment without significant acute effects, and was discharged to home in stable condition.    PLAN: Continue treatment as planned.  ________________________________  Blair Promise, PhD, MD

## 2019-08-20 ENCOUNTER — Ambulatory Visit: Payer: Medicare Other | Admitting: Radiation Oncology

## 2019-08-21 ENCOUNTER — Ambulatory Visit
Admission: RE | Admit: 2019-08-21 | Discharge: 2019-08-21 | Disposition: A | Discharge status: Home / Self Care | Payer: Medicare Other | Source: Ambulatory Visit | Attending: Radiation Oncology | Admitting: Radiation Oncology

## 2019-08-21 ENCOUNTER — Encounter: Payer: Self-pay | Admitting: Radiation Oncology

## 2019-08-21 ENCOUNTER — Other Ambulatory Visit: Payer: Self-pay

## 2019-08-21 DIAGNOSIS — R918 Other nonspecific abnormal finding of lung field: Secondary | ICD-10-CM | POA: Diagnosis not present

## 2019-08-21 DIAGNOSIS — Z510 Encounter for antineoplastic radiation therapy: Secondary | ICD-10-CM | POA: Diagnosis not present

## 2019-08-21 DIAGNOSIS — R911 Solitary pulmonary nodule: Secondary | ICD-10-CM

## 2019-08-21 NOTE — Progress Notes (Signed)
  Radiation Oncology         (336) [phone] ________________________________  Name: [name] MRN: [id number]  Date: 08/21/2019  DOB: 12/12/1945  Stereotactic Body Radiotherapy Treatment Procedure Note  NARRATIVE:  [name] was brought to the stereotactic radiation treatment machine and placed supine on the CT couch. The patient was set up for stereotactic body radiotherapy on the body fix pillow.  3D TREATMENT PLANNING AND DOSIMETRY:  The patient's radiation plan was reviewed and approved prior to starting treatment.  It showed 3-dimensional radiation distributions overlaid onto the planning CT.  The [facility] for the target structures as well as the organs at risk were reviewed. The documentation of this is filed in the radiation oncology EMR.  SIMULATION VERIFICATION:  The patient underwent CT imaging on the treatment unit.  These were carefully aligned to document that the ablative radiation dose would cover the target volume and maximally spare the nearby organs at risk according to the planned distribution.  SPECIAL TREATMENT PROCEDURE: [name] received high dose ablative stereotactic body radiotherapy to the planned target volume without unforeseen complications. Treatment was delivered uneventfully. The high doses associated with stereotactic body radiotherapy and the significant potential risks require careful treatment set up and patient monitoring constituting a special treatment procedure   STEREOTACTIC TREATMENT MANAGEMENT:  Following delivery, the patient was evaluated clinically. The patient tolerated treatment without significant acute effects, and was discharged to home in stable condition.    PLAN: Continue treatment as planned.  ________________________________  Blair Promise, PhD, MD

## 2019-08-22 ENCOUNTER — Ambulatory Visit (INDEPENDENT_AMBULATORY_CARE_PROVIDER_SITE_OTHER): Payer: Medicare Other | Admitting: *Deleted

## 2019-08-22 DIAGNOSIS — I255 Ischemic cardiomyopathy: Secondary | ICD-10-CM

## 2019-08-22 DIAGNOSIS — I5042 Chronic combined systolic (congestive) and diastolic (congestive) heart failure: Secondary | ICD-10-CM | POA: Diagnosis not present

## 2019-08-22 LAB — CUP PACEART REMOTE DEVICE CHECK
Battery Remaining Longevity: 66 mo
Battery Remaining Percentage: 87 %
Brady Statistic RA Percent Paced: 0 %
Brady Statistic RV Percent Paced: 88 %
Date Time Interrogation Session: 20200911064100
HighPow Impedance: 43 Ohm
Implantable Lead Implant Date: 20060411
Implantable Lead Implant Date: 20151030
Implantable Lead Implant Date: 20151030
Implantable Lead Location: 753858
Implantable Lead Location: 753859
Implantable Lead Location: 753860
Implantable Lead Model: 148
Implantable Lead Model: 4136
Implantable Lead Serial Number: 145070
Implantable Lead Serial Number: 29713415
Implantable Pulse Generator Implant Date: 20151030
Lead Channel Impedance Value: 579 Ohm
Lead Channel Impedance Value: 653 Ohm
Lead Channel Impedance Value: 679 Ohm
Lead Channel Pacing Threshold Amplitude: 0.8 V
Lead Channel Pacing Threshold Amplitude: 0.8 V
Lead Channel Pacing Threshold Amplitude: 2.1 V
Lead Channel Pacing Threshold Pulse Width: 0.4 ms
Lead Channel Pacing Threshold Pulse Width: 0.4 ms
Lead Channel Pacing Threshold Pulse Width: 0.9 ms
Lead Channel Setting Pacing Amplitude: 2 V
Lead Channel Setting Pacing Amplitude: 2 V
Lead Channel Setting Pacing Amplitude: 3.5 V
Lead Channel Setting Pacing Pulse Width: 0.4 ms
Lead Channel Setting Pacing Pulse Width: 0.9 ms
Lead Channel Setting Sensing Sensitivity: 0.6 mV
Lead Channel Setting Sensing Sensitivity: 1 mV
Pulse Gen Serial Number: 370874

## 2019-08-26 ENCOUNTER — Telehealth: Payer: Self-pay | Admitting: *Deleted

## 2019-08-26 DIAGNOSIS — I493 Ventricular premature depolarization: Secondary | ICD-10-CM

## 2019-08-26 NOTE — Telephone Encounter (Signed)
Latitude alert received for decreased BiV pacing, now 88% (02/11/19-08/26/19), previously averaged 92% (02/07/18-02/11/19). 78% BiVP in the past 24hrs. Presenting EGM shows AS/BiVP @ 65bpm with PVCs, trigeminy noted at times. See histograms below. No VT/VF or AT/AF episodes since last reset. Available lead trends stable. Currently taking amiodarone 100mg  daily, previously decreased due to tremors. As of 07/08/19, EF 35-40% per Dr. Claris Gladden note.   Routed to Dr. Lovena Le and [name] for review and recommendations.     Presenting rhythm:

## 2019-08-26 NOTE — Telephone Encounter (Signed)
Increase amiodarone to 200 mg daily for now.  Arrange for Zio patch x 3 days to quantify PVCs

## 2019-08-27 ENCOUNTER — Other Ambulatory Visit: Payer: Self-pay

## 2019-08-27 ENCOUNTER — Ambulatory Visit (HOSPITAL_COMMUNITY)
Admission: RE | Admit: 2019-08-27 | Discharge: 2019-08-27 | Disposition: A | Discharge status: Home / Self Care | Payer: Medicare Other | Source: Ambulatory Visit | Attending: Cardiology | Admitting: Cardiology

## 2019-08-27 DIAGNOSIS — I493 Ventricular premature depolarization: Secondary | ICD-10-CM

## 2019-08-27 MED ORDER — AMIODARONE HCL 200 MG PO TABS: 200.0000 mg | ORAL_TABLET | Freq: Every day | ORAL | 3 refills | Status: DC

## 2019-08-27 NOTE — Telephone Encounter (Signed)
Spoke with patient and wife, aware that he needs to increase amiodarone to 200mg  daily due to increase PVC's noted on device.  Pt will come in at 11am for nursing visit for 3 day zio patch.

## 2019-08-29 NOTE — Progress Notes (Signed)
Remote ICD transmission.   

## 2019-09-09 ENCOUNTER — Other Ambulatory Visit: Payer: Self-pay

## 2019-09-09 ENCOUNTER — Ambulatory Visit (HOSPITAL_COMMUNITY)
Admission: RE | Admit: 2019-09-09 | Discharge: 2019-09-09 | Disposition: A | Discharge status: Home / Self Care | Payer: Medicare Other | Source: Ambulatory Visit | Attending: Cardiology | Admitting: Cardiology

## 2019-09-09 DIAGNOSIS — I739 Peripheral vascular disease, unspecified: Secondary | ICD-10-CM | POA: Insufficient documentation

## 2019-09-09 DIAGNOSIS — I5022 Chronic systolic (congestive) heart failure: Secondary | ICD-10-CM | POA: Insufficient documentation

## 2019-09-09 LAB — HEPATIC FUNCTION PANEL
ALT: 19 U/L (ref 0–44)
AST: 19 U/L (ref 15–41)
Albumin: 3.8 g/dL (ref 3.5–5.0)
Alkaline Phosphatase: 61 U/L (ref 38–126)
Bilirubin, Direct: 0.2 mg/dL (ref 0.0–0.2)
Indirect Bilirubin: 0.9 mg/dL (ref 0.3–0.9)
Total Bilirubin: 1.1 mg/dL (ref 0.3–1.2)
Total Protein: 6.5 g/dL (ref 6.5–8.1)

## 2019-09-09 LAB — LIPID PANEL
Cholesterol: 94 mg/dL (ref 0–200)
HDL: 29 mg/dL — ABNORMAL LOW (ref >40)
LDL Cholesterol: 36 mg/dL (ref 0–99)
Total CHOL/HDL Ratio: 3.2 RATIO
Triglycerides: 147 mg/dL (ref <150)
VLDL: 29 mg/dL (ref 0–40)

## 2019-09-10 ENCOUNTER — Other Ambulatory Visit (HOSPITAL_COMMUNITY): Payer: Self-pay | Admitting: Cardiology

## 2019-09-10 DIAGNOSIS — I491 Atrial premature depolarization: Secondary | ICD-10-CM | POA: Diagnosis not present

## 2019-09-18 ENCOUNTER — Ambulatory Visit
Admission: RE | Admit: 2019-09-18 | Discharge: 2019-09-18 | Disposition: A | Discharge status: Home / Self Care | Payer: Medicare Other | Source: Ambulatory Visit | Attending: Radiation Oncology | Admitting: Radiation Oncology

## 2019-09-18 ENCOUNTER — Encounter: Payer: Self-pay | Admitting: Radiation Oncology

## 2019-09-18 ENCOUNTER — Other Ambulatory Visit: Payer: Self-pay

## 2019-09-18 VITALS — BP 85/38 | HR 64 | Temp 98.7°F | Resp 18 | Ht 60.75 in

## 2019-09-18 DIAGNOSIS — R911 Solitary pulmonary nodule: Secondary | ICD-10-CM | POA: Diagnosis not present

## 2019-09-18 DIAGNOSIS — Z7982 Long term (current) use of aspirin: Secondary | ICD-10-CM | POA: Diagnosis not present

## 2019-09-18 DIAGNOSIS — Z7901 Long term (current) use of anticoagulants: Secondary | ICD-10-CM | POA: Diagnosis not present

## 2019-09-18 DIAGNOSIS — J449 Chronic obstructive pulmonary disease, unspecified: Secondary | ICD-10-CM | POA: Diagnosis not present

## 2019-09-18 DIAGNOSIS — Z79899 Other long term (current) drug therapy: Secondary | ICD-10-CM | POA: Insufficient documentation

## 2019-09-18 DIAGNOSIS — Z923 Personal history of irradiation: Secondary | ICD-10-CM | POA: Insufficient documentation

## 2019-09-18 MED ORDER — PREDNISONE 10 MG PO TABS: ORAL_TABLET | ORAL | 0 refills | Status: DC

## 2019-09-18 NOTE — Patient Instructions (Signed)
Coronavirus (COVID-19) Are you at risk?  Are you at risk for the Coronavirus (COVID-19)?  To be considered HIGH RISK for Coronavirus (COVID-19), you have to meet the following criteria:  . Traveled to [place], [place], [place], [place] or [place]; or in the [place] to [place], [place], [place], or [place]; and have fever, cough, and shortness of breath within the last 2 weeks of travel OR . Been in close contact with a person diagnosed with COVID-19 within the last 2 weeks and have fever, cough, and shortness of breath . IF YOU DO NOT MEET THESE CRITERIA, YOU ARE CONSIDERED LOW RISK FOR COVID-19.  What to do if you are HIGH RISK for COVID-19?  . If you are having a medical emergency, call 911. . Seek medical care right away. Before you go to a doctor's office, urgent care or emergency department, call ahead and tell them about your recent travel, contact with someone diagnosed with COVID-19, and your symptoms. You should receive instructions from your physician's office regarding next steps of care.  . When you arrive at healthcare provider, tell the healthcare staff immediately you have returned from visiting [place], [place], [place], [place] or [place]; or traveled in the [place] to [place], [place], [place], or [place]; in the last two weeks or you have been in close contact with a person diagnosed with COVID-19 in the last 2 weeks.   . Tell the health care staff about your symptoms: fever, cough and shortness of breath. . After you have been seen by a medical provider, you will be either: o Tested for (COVID-19) and discharged home on quarantine except to seek medical care if symptoms worsen, and asked to  - Stay home and avoid contact with others until you get your results (4-5 days)  - Avoid travel on public transportation if possible (such as bus, train, or airplane) or o Sent to the Emergency Department by EMS for evaluation, COVID-19 testing, and possible  admission depending on your condition and test results.  What to do if you are LOW RISK for COVID-19?  Reduce your risk of any infection by using the same precautions used for avoiding the common cold or flu:  . Wash your hands often with soap and warm water for at least 20 seconds.  If soap and water are not readily available, use an alcohol-based hand sanitizer with at least 60% alcohol.  . If coughing or sneezing, cover your mouth and nose by coughing or sneezing into the elbow areas of your shirt or coat, into a tissue or into your sleeve (not your hands). . Avoid shaking hands with others and consider head nods or verbal greetings only. . Avoid touching your eyes, nose, or mouth with unwashed hands.  . Avoid close contact with people who are sick. . Avoid places or events with large numbers of people in one location, like concerts or sporting events. . Carefully consider travel plans you have or are making. . If you are planning any travel outside or inside the [place], visit the CDC's Travelers' Health webpage for the latest health notices. . If you have some symptoms but not all symptoms, continue to monitor at home and seek medical attention if your symptoms worsen. . If you are having a medical emergency, call 911.   ADDITIONAL HEALTHCARE OPTIONS FOR PATIENTS  [facility] / e-Visit: [email or web address]         [facility]: [phone]  [facility]: [phone]                   [facility]: [phone]   

## 2019-09-18 NOTE — Progress Notes (Signed)
  Patient Name: [name] MRN: [id number] DOB: 1946-06-04 Referring Physician: Audry Pili (Profile Not Attached) Date of Service: 08/21/2019 [facility], [place]                                                        End Of Treatment Note  Diagnoses: R91.8-Other nonspecific abnormal finding of lung field  Cancer Staging: Progressive LUL lesion  Intent: Curative  Radiation Treatment Dates: 08/14/2019 through 08/21/2019 Site Technique Total Dose Dose per Fx Completed Fx Beam Energies  Thorax: Lung_Lt SBRT 54/54 18 3/3 6XFFF   Narrative: The patient tolerated radiation therapy relatively well. He noted shortness of breath and difficulty breathing at baseline. He reported severe fatigue, causing him to sleep approximately 16 hours each day. He denied chest pain, difficulty swallowing, cough, and hemoptysis.  Plan: The patient will follow-up with radiation oncology in 1 month.  ________________________________________________   Blair Promise, PhD, MD  This document serves as a record of services personally performed by Gery Pray, MD. It was created on his behalf by Wilburn Mylar, a trained medical scribe. The creation of this record is based on the scribe's personal observations and the provider's statements to them. This document has been checked and approved by the attending provider.

## 2019-09-18 NOTE — Progress Notes (Signed)
  Radiation Oncology         [phone] ________________________________  Name: [name] MRN: [id number]  Date: 07/31/2019  DOB: Sep 05, 1946   STEREOTACTIC BODY RADIOTHERAPY SIMULATION AND TREATMENT PLANNING NOTE    DIAGNOSIS: Solitary, PET positive pulmonary nodule in the left upper lung  NARRATIVE:  The patient was brought to the [facility] suite.  Identity was confirmed.  All relevant records and images related to the planned course of therapy were reviewed.  The patient freely provided informed written consent to proceed with treatment after reviewing the details related to the planned course of therapy. The consent form was witnessed and verified by the simulation staff.  Then, the patient was set-up in a stable reproducible  supine position for radiation therapy.  A BodyFix immobilization pillow was fabricated for reproducible positioning.  Then I personally applied the abdominal compression paddle to limit respiratory excursion.  4D respiratoy motion management CT images were obtained.  Surface markings were placed.  The CT images were loaded into the planning software.  Then, using Cine, MIP, and standard views, the internal target volume (ITV) and planning target volumes (PTV) were delinieated, and avoidance structures were contoured.  Treatment planning then occurred.  The radiation prescription was entered and confirmed.  A total of two complex treatment devices were fabricated in the form of the BodyFix immobilization pillow and a neck accuform cushion.  I have requested : 3D Simulation  I have requested a DVH of the following structures: Heart, Lungs, Esophagus, Chest Wall, Brachial Plexus, Major Blood Vessels, and targets.  PLAN:  The patient will receive 54 Gy in 3 fractions.  -----------------------------------  Blair Promise, PhD, MD

## 2019-09-18 NOTE — Progress Notes (Signed)
Radiation Oncology         [phone] ________________________________  Name: [name] MRN: [id number]  Date: 09/18/2019  DOB: [birth year]  Follow-Up Visit Note  CC: [name], MD  [name], MD    ICD-10-CM   1. Nodule of upper lobe of left lung  R91.1     Diagnosis: PET positive pulmonary nodule in the left upper lobe  Interval Since Last Radiation:  1 month   08/14/2019 through 08/21/2019 Site Technique Total Dose Dose per Fx Completed Fx Beam Energies  Thorax: Lung_Lt SBRT 54/54 18 3/3 6XFFF   12/09/2018 - 01/20/2019: Right lung (retx) / 60 Gy in 30 fractions (IMRT)  2/25, 2/27, 02/08/18: Right lower lung / 54 Gy in 3 fractions(SBRT)  4/30, 5/3, 04/17/17: Rightupper lung/ 54 Gy in 3 fractions (SBRT)  Narrative:  The patient returns today for routine follow-up. He is accompanied by his wife.   On review of systems, his wife reports his shortness of breath is worse. Nurse notes he is visibly and audibly short of breath. Patient attributes it to COPD and is on 3L of oxygen. He denies pain, cough, hemoptysis, difficulty swallowing, and any worsening of existing fatigue.  ALLERGIES:  is allergic to tussionex pennkinetic er [hydrocod polst-cpm polst er] and ace inhibitors.  Meds: Current Outpatient Medications  Medication Sig Dispense Refill  . albuterol (PROVENTIL HFA;VENTOLIN HFA) 108 (90 BASE) MCG/ACT inhaler Inhale 2 puffs into the lungs every 6 (six) hours as needed for wheezing or shortness of breath. 3 Inhaler 4  . albuterol (PROVENTIL) (2.5 MG/3ML) 0.083% nebulizer solution Take 3 mLs (2.5 mg total) by nebulization every 6 (six) hours. 120 mL 3  . amiodarone (PACERONE) 200 MG tablet Take 1 tablet (200 mg total) by mouth daily. 45 tablet 3  . arformoterol (BROVANA) 15 MCG/2ML NEBU Take 2 mLs (15 mcg total) by nebulization 2 (two) times daily. 120 mL 5  . aspirin 81 MG tablet Take 81 mg by mouth daily.    Marland Kitchen atorvastatin (LIPITOR) 80 MG tablet TAKE 1  TABLET BY MOUTH EVERY DAY 90 tablet 2  . ATROVENT HFA 17 MCG/ACT inhaler TAKE 2 PUFFS BY MOUTH EVERY 4 HOURS AS NEEDED FOR WHEEZE  12  . budesonide (PULMICORT) 0.5 MG/2ML nebulizer solution Take 2 mLs (0.5 mg total) by nebulization 2 (two) times daily. 120 mL 5  . carvedilol (COREG) 25 MG tablet Take 0.5 tablets (12.5 mg total) by mouth 2 (two) times daily with a meal. 45 tablet 3  . diazepam (VALIUM) 5 MG tablet 1-2 tabs every 8 hours if needed for tremor 30 tablet 5  . ENTRESTO 49-51 MG TAKE 1 TABLET BY MOUTH TWICE A DAY 60 tablet 6  . ezetimibe (ZETIA) 10 MG tablet Take 1 tablet (10 mg total) by mouth daily. 90 tablet 3  . finasteride (PROSCAR) 5 MG tablet Take 5 mg by mouth daily.    . fluticasone (FLONASE) 50 MCG/ACT nasal spray Instill 1 spray in each  nostril daily 32 g 3  . Fluticasone-Umeclidin-Vilant (TRELEGY ELLIPTA) 100-62.5-25 MCG/INH AEPB Inhale 1 puff into the lungs daily. 1 each 0  . Glycopyrrolate (LONHALA MAGNAIR REFILL KIT) 25 MCG/ML SOLN Inhale 1 Act into the lungs 2 (two) times daily. 60 mL 11  . isosorbide mononitrate (IMDUR) 60 MG 24 hr tablet TAKE 1 TABLET BY MOUTH  DAILY 90 tablet 3  . KLOR-CON M20 20 MEQ tablet TAKE 1 TABLET BY MOUTH EVERY DAY 90 tablet 2  .  levothyroxine (SYNTHROID) 50 MCG tablet TAKE 1 TABLET BY MOUTH  DAILY BEFORE BREAKFAST 90 tablet 0  . nitroGLYCERIN (NITROSTAT) 0.4 MG SL tablet DISSOLVE 1 TABLET UNDER THE TONGUE EVERY 5 MINUTES AS  NEEDED FOR CHEST PAIN , MAX 3 TABLETS IN 15 MIN, CALL  911 IF CHEST PAIN PERSISTS 25 tablet 0  . NON FORMULARY at bedtime. CPAP And during the day as needed    . spironolactone (ALDACTONE) 25 MG tablet TAKE 1 TABLET BY MOUTH  DAILY 90 tablet 3  . torsemide (DEMADEX) 20 MG tablet Take 4 tablets (80 mg total) by mouth once for 1 dose. ON FRIDAYS ONLY 90 tablet 2  . VASCEPA 1 g CAPS TAKE 2 CAPSULES (2 G TOTAL) BY MOUTH 2 (TWO) TIMES A DAY. 120 capsule 1  . XARELTO 2.5 MG TABS tablet TAKE 1 TABLET BY MOUTH TWICE A DAY 60  tablet 3   No current facility-administered medications for this encounter.     Physical Findings: The patient is in no acute distress. Patient is alert and oriented.  height is 5' 0.75" (1.543 m). His temporal temperature is 98.7 F (37.1 C). His blood pressure is 85/38 (abnormal) and his pulse is 64. His respiration is 18 and oxygen saturation is 99%. .  No significant changes. Lungs are clear to auscultation bilaterally. Heart has regular rate and rhythm. No palpable cervical, supraclavicular, or axillary adenopathy. Abdomen soft, non-tender, normal bowel sounds.   Lab Findings: Lab Results  Component Value Date   WBC 6.7 04/10/2019   HGB 13.7 04/10/2019   HCT 43.4 04/10/2019   MCV 96.4 04/10/2019   PLT 154 04/10/2019    Radiographic Findings: No results found.  Impression:  The patient is recovering from the effects of radiation.  His breathing seems to be a little worse since completion of his most recent course of stereotactic body radiation therapy  Plan: Patient will be scheduled for CT scan of the chest in approximately 3 months.  Patient will be placed on prednisone to see if this will help with his breathing issues.  Follow-up in 3 to 4 weeks to assess his response to steroids.  ____________________________________ Gery Pray, MD   This document serves as a record of services personally performed by Gery Pray, MD. It was created on his behalf by Wilburn Mylar, a trained medical scribe. The creation of this record is based on the scribe's personal observations and the provider's statements to them. This document has been checked and approved by the attending provider.

## 2019-09-18 NOTE — Progress Notes (Signed)
Pt presents today for f/u with Dr. Sondra Come. Pt denies c/o pain. Pt denies any worsening of existing fatigue. Pt visibly and audibly SOB during interview. Pt denies cough, hemoptysis. Pt denies difficulty swallowing. Pt's wife reports SOB is worse and pt attributes to COPD. Pt is on 3L [place].   BP (!) 85/38 (BP Location: Left Arm, Patient Position: Sitting)   Pulse 64   Temp 98.7 F (37.1 C) (Temporal)   Resp 18   Ht 5' 0.75" (1.543 m)   SpO2 99%   BMI 51.44 kg/m   Wt Readings from Last 3 Encounters:  07/08/19 270 lb (122.5 kg)  06/12/19 267 lb 9.6 oz (121.4 kg)  06/03/19 267 lb 6.4 oz (121.3 kg)   Loma Sousa, RN BSN

## 2019-09-22 ENCOUNTER — Telehealth (HOSPITAL_COMMUNITY): Payer: Self-pay | Admitting: *Deleted

## 2019-09-22 ENCOUNTER — Other Ambulatory Visit: Payer: Self-pay | Admitting: Internal Medicine

## 2019-09-22 MED ORDER — CARVEDILOL 12.5 MG PO TABS: 18.7500 mg | ORAL_TABLET | Freq: Two times a day (BID) | ORAL | 3 refills | Status: DC

## 2019-09-22 NOTE — Telephone Encounter (Signed)
-----   Message from Larey Dresser, MD sent at 09/19/2019  9:55 AM EDT ----- Frequent PVCs.  Increase Coreg to 18.75 mg bid, continue amiodarone 200 mg daily.

## 2019-09-22 NOTE — Telephone Encounter (Signed)
Notes recorded by Harvie Junior, CMA on 09/22/2019 at 9:47 AM EDT  Spoke with pts wife she is aware and agreeable with plan.  ------   Notes recorded by Valeda Malm, RN on 09/19/2019 at 3:15 PM EDT  LM for patient to call office  ------   Notes recorded by Larey Dresser, MD on 09/19/2019 at 9:55 AM EDT  Frequent PVCs. Increase Coreg to 18.75 mg bid, continue amiodarone 200 mg daily.

## 2019-09-29 DIAGNOSIS — Z23 Encounter for immunization: Secondary | ICD-10-CM | POA: Diagnosis not present

## 2019-10-04 ENCOUNTER — Other Ambulatory Visit (HOSPITAL_COMMUNITY): Payer: Self-pay | Admitting: Internal Medicine

## 2019-10-06 ENCOUNTER — Encounter (HOSPITAL_COMMUNITY): Payer: Self-pay | Admitting: *Deleted

## 2019-10-06 ENCOUNTER — Telehealth (HOSPITAL_COMMUNITY): Payer: Self-pay | Admitting: Pharmacy Technician

## 2019-10-06 ENCOUNTER — Telehealth (HOSPITAL_COMMUNITY): Payer: Self-pay

## 2019-10-06 ENCOUNTER — Encounter (HOSPITAL_COMMUNITY): Payer: Self-pay | Admitting: Cardiology

## 2019-10-06 ENCOUNTER — Ambulatory Visit (HOSPITAL_COMMUNITY)
Admission: RE | Admit: 2019-10-06 | Discharge: 2019-10-06 | Disposition: A | Discharge status: Home / Self Care | Payer: Medicare Other | Source: Ambulatory Visit | Attending: Cardiology | Admitting: Cardiology

## 2019-10-06 ENCOUNTER — Other Ambulatory Visit: Payer: Self-pay

## 2019-10-06 VITALS — BP 109/79 | HR 61 | Wt 271.2 lb

## 2019-10-06 DIAGNOSIS — Z8249 Family history of ischemic heart disease and other diseases of the circulatory system: Secondary | ICD-10-CM | POA: Insufficient documentation

## 2019-10-06 DIAGNOSIS — C3431 Malignant neoplasm of lower lobe, right bronchus or lung: Secondary | ICD-10-CM | POA: Insufficient documentation

## 2019-10-06 DIAGNOSIS — I2510 Atherosclerotic heart disease of native coronary artery without angina pectoris: Secondary | ICD-10-CM

## 2019-10-06 DIAGNOSIS — Z7982 Long term (current) use of aspirin: Secondary | ICD-10-CM | POA: Diagnosis not present

## 2019-10-06 DIAGNOSIS — I739 Peripheral vascular disease, unspecified: Secondary | ICD-10-CM | POA: Diagnosis not present

## 2019-10-06 DIAGNOSIS — I472 Ventricular tachycardia: Secondary | ICD-10-CM | POA: Diagnosis not present

## 2019-10-06 DIAGNOSIS — Z951 Presence of aortocoronary bypass graft: Secondary | ICD-10-CM | POA: Diagnosis not present

## 2019-10-06 DIAGNOSIS — I5022 Chronic systolic (congestive) heart failure: Secondary | ICD-10-CM | POA: Insufficient documentation

## 2019-10-06 DIAGNOSIS — E785 Hyperlipidemia, unspecified: Secondary | ICD-10-CM | POA: Insufficient documentation

## 2019-10-06 DIAGNOSIS — I5042 Chronic combined systolic (congestive) and diastolic (congestive) heart failure: Secondary | ICD-10-CM | POA: Diagnosis not present

## 2019-10-06 DIAGNOSIS — E039 Hypothyroidism, unspecified: Secondary | ICD-10-CM | POA: Diagnosis not present

## 2019-10-06 DIAGNOSIS — I714 Abdominal aortic aneurysm, without rupture: Secondary | ICD-10-CM | POA: Diagnosis not present

## 2019-10-06 DIAGNOSIS — J449 Chronic obstructive pulmonary disease, unspecified: Secondary | ICD-10-CM | POA: Diagnosis not present

## 2019-10-06 DIAGNOSIS — N400 Enlarged prostate without lower urinary tract symptoms: Secondary | ICD-10-CM | POA: Insufficient documentation

## 2019-10-06 DIAGNOSIS — E1151 Type 2 diabetes mellitus with diabetic peripheral angiopathy without gangrene: Secondary | ICD-10-CM | POA: Insufficient documentation

## 2019-10-06 DIAGNOSIS — Z6841 Body Mass Index (BMI) 40.0 and over, adult: Secondary | ICD-10-CM | POA: Insufficient documentation

## 2019-10-06 DIAGNOSIS — I255 Ischemic cardiomyopathy: Secondary | ICD-10-CM | POA: Diagnosis not present

## 2019-10-06 DIAGNOSIS — Z923 Personal history of irradiation: Secondary | ICD-10-CM | POA: Insufficient documentation

## 2019-10-06 DIAGNOSIS — R251 Tremor, unspecified: Secondary | ICD-10-CM | POA: Insufficient documentation

## 2019-10-06 DIAGNOSIS — G4733 Obstructive sleep apnea (adult) (pediatric): Secondary | ICD-10-CM | POA: Insufficient documentation

## 2019-10-06 DIAGNOSIS — Z7951 Long term (current) use of inhaled steroids: Secondary | ICD-10-CM | POA: Insufficient documentation

## 2019-10-06 DIAGNOSIS — I6522 Occlusion and stenosis of left carotid artery: Secondary | ICD-10-CM | POA: Insufficient documentation

## 2019-10-06 DIAGNOSIS — I110 Hypertensive heart disease with heart failure: Secondary | ICD-10-CM | POA: Diagnosis not present

## 2019-10-06 DIAGNOSIS — Z79899 Other long term (current) drug therapy: Secondary | ICD-10-CM | POA: Diagnosis not present

## 2019-10-06 DIAGNOSIS — I447 Left bundle-branch block, unspecified: Secondary | ICD-10-CM | POA: Insufficient documentation

## 2019-10-06 DIAGNOSIS — I493 Ventricular premature depolarization: Secondary | ICD-10-CM | POA: Insufficient documentation

## 2019-10-06 DIAGNOSIS — I252 Old myocardial infarction: Secondary | ICD-10-CM | POA: Insufficient documentation

## 2019-10-06 DIAGNOSIS — Z95810 Presence of automatic (implantable) cardiac defibrillator: Secondary | ICD-10-CM | POA: Diagnosis not present

## 2019-10-06 DIAGNOSIS — Z800 Family history of malignant neoplasm of digestive organs: Secondary | ICD-10-CM | POA: Insufficient documentation

## 2019-10-06 DIAGNOSIS — Z801 Family history of malignant neoplasm of trachea, bronchus and lung: Secondary | ICD-10-CM | POA: Insufficient documentation

## 2019-10-06 DIAGNOSIS — R9431 Abnormal electrocardiogram [ECG] [EKG]: Secondary | ICD-10-CM | POA: Insufficient documentation

## 2019-10-06 DIAGNOSIS — I25810 Atherosclerosis of coronary artery bypass graft(s) without angina pectoris: Secondary | ICD-10-CM | POA: Diagnosis not present

## 2019-10-06 DIAGNOSIS — Z7952 Long term (current) use of systemic steroids: Secondary | ICD-10-CM | POA: Insufficient documentation

## 2019-10-06 DIAGNOSIS — Z820 Family history of epilepsy and other diseases of the nervous system: Secondary | ICD-10-CM | POA: Insufficient documentation

## 2019-10-06 DIAGNOSIS — Z7901 Long term (current) use of anticoagulants: Secondary | ICD-10-CM | POA: Diagnosis not present

## 2019-10-06 DIAGNOSIS — Z87891 Personal history of nicotine dependence: Secondary | ICD-10-CM | POA: Insufficient documentation

## 2019-10-06 DIAGNOSIS — Z79890 Hormone replacement therapy (postmenopausal): Secondary | ICD-10-CM | POA: Insufficient documentation

## 2019-10-06 LAB — TSH: TSH: 4.041 u[IU]/mL (ref 0.350–4.500)

## 2019-10-06 MED ORDER — CARVEDILOL 25 MG PO TABS: 25.0000 mg | ORAL_TABLET | Freq: Two times a day (BID) | ORAL | 3 refills | Status: DC

## 2019-10-06 NOTE — Progress Notes (Signed)
Date:  10/06/2019   ID:  [name], DOB 1946/12/06, MRN [id number]    Provider location: [place] [facility] Type of Visit: Established patient  PCP:  Tyrone Lima, MD  Cardiologist:  Dr. Aundra Small  Chief Complaint: Shortness of breath   History of Present Illness: [name] is a 73 y.o. male who has a past medical history significant for morbid obesity, systolic heart failure with an EF of 30-35% (January 2014) -> 20-25% (March 2015 - RV normal), VT on amio, CAD s/p CABG 1996, LBBB, COPD, obstructive sleep apnea on CPAP, AAA repair (January 2015).   Admitted to [facility] July 24 through July 06 2014 with chest pain.  CEs negative. Had a LHC with no change from previous LHC with recommendations to continue medical management. Discharge weight was 255 pounds.   LHC 07/06/14 --No significant change from previous studies.  Left anterior descending (LAD): The LAD is a large vessel. There is a 40% stenosis immediately after the takeoff of a large septal perforator. There are 2 large diagonal branches without significant disease. Left circumflex (LCx): The LCx is occluded proximally. There are left to left collaterals.  Right coronary artery (RCA): The RCA is occluded proximally immediately following the conus branch. There are right to right and left to right collaterals. SVGs from CABG known to be totally occluded.   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   CTA chest/abd/pelvis (3/16) with moderate emphysema, 1.3 cm nodule RUL, left SFA totally occluded, right SFA with significant stenosis, s/p endovascular AAA repair (stable).   Echo (5/17) with EF 20-25%, severe LV dilation, mild MR, normal RV size with mildly decreased systolic function.   He has a RUL nodule that is most likely lung cancer, he is being treated with radiation.   Echo (8/18) with EF 30%, basal-mid inferior and inferolateral AK, severe LV dilation, normal RV, mild MR.   RHC/LHC was  done in 1/19 due to worsening exertional dyspnea.  This showed normal filling pressures and preserved cardiac output.  There was 30-40% mLAD stenosis.  The LAD provided collaterals to the LCx and RCA territories.  The LCx, RCA, SVG-OM, and SVG-PDA were all chronically occluded (known from the past).  No new disease.   [facility] with 0.78 on right, 0.59 on left => left iliac > 50% stenosis.  11/19 ABIs 0.78 right, 0.59 left.  3/20 ABIs 0.74 right, 0.66 left.   He saw neurology about his tremor.  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  However, he is on amiodarone which may be contributing.  He has been seeing Dr. Vertell Small with plan for deep brain stimulator, on hold currently due to cancer treatment.   He had chest radiation for RLL lung cancer.  However, CT showed new RUL lesion in 11/19 and he restart XRT in 12/19, repeat XRT is now completed.   Last CT chest showed a lesion in the left upper lobe.  He had XRT again, finishing in 9/20.   Echo in 7/20 showed EF 35-40%, basal to mid inferolateral and inferior akinesis, mildly decreased RV systolic function, mild AS, PASP 22 mmHg.    He returns for followup of CHF and CAD.  He remains short of breath after walking 50-100 feet. Worse recently, no wheezing. He came in in a wheelchair today.  No orthopnea/PND.  No chest pain.  Using Trelegy only prn.  Weight is stable.  No lightheadedness.  Occasional palpitations.  He is using home oxygen.  No claudication though he is not very active.   [employer] device interrogation: 88% BiV pacing, no AF or VT, frequent PVCs  ECG (personally reviewed): NSR, BiV pacing  Labs 3/15 Cr 1.5 K 5.6 Labs 07/06/14 K 3.9 Creatinine  0.98 Labs 9/15 K 5.1, creatinine 0.96 Labs 11/15 K 3.8, creatinine 0.83, LDL 61, LFTs normal, TSH normal Labs 11/26/14 K 5.0 Creatinine 0.73 , LFTs normal, TSH normal,  Labs 12/08/14 K 3.4 Creatinine 0.83  Labs 3/16 K 4.4, creatinine 1.03, LFTs normal, TSH normal, HCT  37.8, BNP 65 Labs 6/16 K 4.1, creatinine 0.93, TSH normal, LFTs normal Labs 11/16 K 5.1, creatinine 1.13, LFTs normal, TSH normal, LDL 83, HDL 33, TGs 162 Labs 1/17 K 4.4, creatinine 0.86, pro-BNP 154 Labs 2/17 BNP 113, K 3.8, creatinine 0.79, TSH normal, LFTs normal Labs 4/17 K 3.8, creatinine 0.91, HCT 48.1 Labs 6/17 K 3.5, creatinine 0.98 Labs 9/17 K 3.9, creatinine 0.79, LDL 69, HDL 29, TSH normal, LFTs normal Labs 3/18 K 3.6, creatinine 0.87 Labs 8/18 K 3.3 => 4.2, creatinine 0.78 => 1.04, LDL 66, HDL 38, BNP 166 Labs 9/18: K 4.7, creatinine 0.9, BNP 144 Labs 11/18: K 4.8, creatinine 1.05, TSH normal, LFTs normal.  Labs 1/19: K 4.4, creatinine 1.08, LFTs normal Labs 5/19: LDL 60, HDL 28, TSH normal, LFTs normal, K 4.6, creatinine 1.43 Labs 11/19: K 4.6, creatinine 1.42, LFTs normal, TSH normal, Mg 2.3 Labs 1/20: TSH normal, K 4.2, creatinine 1.12, LFTs normal Labs 4/20: LDL 42, HDL 28, TGs 234 Labs 5/20: K 4.8 => 4.4, creatinine 1.95 => 1.38, TSH normal, LFTs normal Labs 7/20: K 4.1, creatinine 1.22 Labs 9/20: LDL 36, HDL 29  Past Medical History:  Diagnosis Date  . AAA (abdominal aortic aneurysm) (HCC)    followed by Dr. Chen  . Adenomatous colon polyp 01/2004  . Anemia    hx  . Automatic implantable cardioverter-defibrillator in situ   . AVM (arteriovenous malformation)   . BPH (benign prostatic hypertrophy)   . CAD (coronary artery disease)    s/p CABGx2 in 1996  . Carotid artery stenosis    LCEA - Dr. Chen in 2013  . CHF (congestive heart failure) (HCC)   . Complication of anesthesia    claustrophobic, unabe to lie on back more than 4 hours at time due to back  . COPD (chronic obstructive pulmonary disease) (HCC)   . Diabetes mellitus    DIET CONTROLLED- pt states that this was a misdiagnosis, he was treated while in the hospital  but returned home, loss a massive amount of weight and he has not had a problem with his blood sugar since. States everything has been  normal for about 3 years.  . Diverticulosis   . Dyspnea   . Dysrhythmia    ICD-defibrillator  . Fatigue   . GERD (gastroesophageal reflux disease)   . H/O hiatal hernia   . History of radiation therapy 04/09/17-04/17/17   SBRT right lung 54 Gy in 3 fractions  . HLD (hyperlipidemia)   . Hypertension   . Hypothyroidism   . Ischemic cardiomyopathy   . Myocardial infarction (HCC)   . NSCL ca   . OSA on CPAP    AHI durign total sleep 14.69/hr, during REM 50.91/hr  . Peripheral vascular disease (HCC)    LCEA, L renal artery stent, bilat iliac stents, R SFA stenosis  . Tremor   . Ventricular tachycardia (HCC) 09/09/2014   Amiodarone was   started after appropriate defibrillator shocks for ventricular tachycardia in October 2008   Past Surgical History:  Procedure Laterality Date  . ABDOMINAL AORTIC ENDOVASCULAR STENT GRAFT [name] 01/06/2014   Procedure: ABDOMINAL AORTIC ENDOVASCULAR STENT GRAFT- GORE; ULTRASOUND GUIDED;  Surgeon: Conrad Latimer, MD;  Location: [place];  Service: Vascular;  Laterality: [name];  . ANGIOPLASTY     BILATERAL  LE  W/STENTS  . BI-VENTRICULAR IMPLANTABLE CARDIOVERTER DEFIBRILLATOR UPGRADE [name] 10/09/2014   Procedure: BI-VENTRICULAR IMPLANTABLE CARDIOVERTER DEFIBRILLATOR UPGRADE;  Surgeon: Evans Lance, MD;  Location: [facility] C[facility] LAB;  Service: Cardiovascular;  Laterality: [name];  . BIV ICD GENERTAOR CHANGE OUT  10/09/2014   UPGRADE TO BIV        BY DR Lovena Le  . 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 Adora Fridge)  . CARDIAC CATHETERIZATION  10/03/2002   SVG sequentially to OM1 & OM2 totally occluded at ostium, SVG to PDA totally occluded within previously placed prox vein graft stent, smal distal AAA, bialt iliac stents with 30% left end-stent restenosis and 50% right end-stent restnosis(Dr. Gerrie Nordmann)  . CARDIAC CATHETERIZATION  06/11/1998   L main with 20% narrowing in distal 1/3; LAD with 1st diagonla having 70% ostial narrowing, 2nd diagonal with 40% narrowing in prox third, Small & RIMA widely patent; in-stent restenosis of RCIA with successful PTA and new prox SVTRCA stent for residual disease (Dr. Marella Chimes)  . CARDIAC DEFIBRILLATOR PLACEMENT  06/2005   Guidant Vitality HE - ischemic cardiomyopathy - Dr. Marella Chimes  . CAROTID ENDARTERECTOMY Left 01/04/12  . CORONARY ANGIOPLASTY  01/08/2004   cutting balloon atherectomy & percutaneous intervention of RCIA in-stent restenosis (Dr. Marella Chimes)  . CORONARY ARTERY BYPASS GRAFT  11/04/1985   x2 - PDA and sequential DX-OM (Dr. Redmond Pulling)  . ENDARTERECTOMY  01/04/2012   Procedure: ENDARTERECTOMY CAROTID;left  Surgeon: Hinda Lenis, MD;  Location: [place];  Service: Vascular;  Laterality: Left;  with patch angioplasty  . FUDUCIAL PLACEMENT [name] 02/23/2017   Procedure: PLACEMENT OF FUDUCIAL;  Surgeon: [name], MD;  Location: [place];  Service: Thoracic;  Laterality: [name];  . ICD GENERATOR CHANGE  05/02/2010   [employer], [employer]  . ILIAC ARTERY STENT Bilateral 03/1997   and L SFA PTA  . Iron infusion  June 16, 2012  . LEFT HEART CATHETERIZATION WITH CORONARY ANGIOGRAM [name] 07/06/2014   Procedure: LEFT HEART CATHETERIZATION WITH CORONARY ANGIOGRAM;  Surgeon: Peter M Martinique, MD;  Location: [facility] CATH LAB;  Service: Cardiovascular;  Laterality: [name];  . NM MYOCAR PERF WALL MOTION  09/2012   lexiscan myoview - mod-severe perfusion defect r/t infarct or scar w/mild periinfarct ishcemia in basal inferior, mid inferior, apical inferior, basal inferolateral & mid inferoalteral regions - EF 21% low risk scan  . POLYPECTOMY    . RENAL ARTERY STENT Left 03/24/2004   6x48m Genesis stent (Dr. RMarella Chimes  . RIGHT/LEFT HEART CATH AND CORONARY ANGIOGRAPHY [name] 12/28/2017   Procedure: RIGHT/LEFT HEART CATH AND CORONARY ANGIOGRAPHY;   Surgeon: MLarey Dresser MD;  [facility];  Service: Cardiovascular;  Laterality: [name];  . TRANSTHORACIC ECHOCARDIOGRAM  12/2012   EF 30-35; LV mod to severely dilated, mod concentric hypertrophy, severe hypokinesis of inferolateral myocardium, moderate hypokineis of anteroseptal region, grade 1 diastolic dysfunction; mod MR; LA mod-severely dialted;  RV mod dialted; RA mildly dilated; PA peak pressure 26mHg  . VIDEO BRONCHOSCOPY WITH ENDOBRONCHIAL NAVIGATION [name] 02/23/2017   Procedure: VIDEO BRONCHOSCOPY WITH ENDOBRONCHIAL NAVIGATION;  Surgeon: SMelrose Nakayama MD;  [place]  Service: Thoracic;  Laterality: [name];     Current Outpatient Medications  Medication Sig Dispense Refill  . albuterol (PROVENTIL HFA;VENTOLIN HFA) 108 (90 BASE) MCG/ACT inhaler Inhale 2 puffs into the lungs every 6 (six) hours as needed for wheezing or shortness of breath. 3 Inhaler 4  . albuterol (PROVENTIL) (2.5 MG/3ML) 0.083% nebulizer solution USE 1 VIAL IN NEBULIZER 4 TIMES DAILY 75 mL 11  . amiodarone (PACERONE) 200 MG tablet Take 1 tablet (200 mg total) by mouth daily. 45 tablet 3  . arformoterol (BROVANA) 15 MCG/2ML NEBU Take 2 mLs (15 mcg total) by nebulization 2 (two) times daily. 120 mL 5  . aspirin 81 MG tablet Take 81 mg by mouth daily.    .Marland Kitchenatorvastatin (LIPITOR) 80 MG tablet TAKE 1 TABLET BY MOUTH EVERY DAY 90 tablet 2  . ATROVENT HFA 17 MCG/ACT inhaler TAKE 2 PUFFS BY MOUTH EVERY 4 HOURS AS NEEDED FOR WHEEZE  12  . budesonide (PULMICORT) 0.5 MG/2ML nebulizer solution Take 2 mLs (0.5 mg total) by nebulization 2 (two) times daily. 120 mL 5  . carvedilol (COREG) 25 MG tablet Take 1 tablet (25 mg total) by mouth 2 (two) times daily with a meal. 180 tablet 3  . diazepam (VALIUM) 5 MG tablet 1-2 tabs every 8 hours if needed for tremor 30 tablet 5  . ENTRESTO 49-51 MG TAKE 1 TABLET BY MOUTH TWICE A DAY 60 tablet 6  . ezetimibe (ZETIA) 10 MG tablet Take 1 tablet (10 mg total) by mouth  daily. 90 tablet 3  . finasteride (PROSCAR) 5 MG tablet Take 5 mg by mouth daily.    . fluticasone (FLONASE) 50 MCG/ACT nasal spray Instill 1 spray in each  nostril daily 32 g 3  . Fluticasone-Umeclidin-Vilant (TRELEGY ELLIPTA) 100-62.5-25 MCG/INH AEPB Inhale 1 puff into the lungs daily. 1 each 0  . Glycopyrrolate (LONHALA MAGNAIR REFILL KIT) 25 MCG/ML SOLN Inhale 1 Act into the lungs 2 (two) times daily. 60 mL 11  . isosorbide mononitrate (IMDUR) 60 MG 24 hr tablet TAKE 1 TABLET BY MOUTH  DAILY 90 tablet 3  . KLOR-CON M20 20 MEQ tablet TAKE 1 TABLET BY MOUTH EVERY DAY 90 tablet 2  . levothyroxine (SYNTHROID) 50 MCG tablet TAKE 1 TABLET BY MOUTH  DAILY BEFORE BREAKFAST 90 tablet 0  . nitroGLYCERIN (NITROSTAT) 0.4 MG SL tablet DISSOLVE 1 TABLET UNDER THE TONGUE EVERY 5 MINUTES AS  NEEDED FOR CHEST PAIN , MAX 3 TABLETS IN 15 MIN, CALL  911 IF CHEST PAIN PERSISTS 25 tablet 0  . NON FORMULARY at bedtime. CPAP And during the day as needed    . predniSONE (DELTASONE) 10 MG tablet 1 tablet daily 30 tablet 0  . spironolactone (ALDACTONE) 25 MG tablet TAKE 1 TABLET BY MOUTH  DAILY 90 tablet 3  . torsemide (DEMADEX) 20 MG tablet Take 1 pill by mouth every morning and 2 tablets by mouth every evening    . VASCEPA 1 g CAPS TAKE 2 CAPSULES (2 G TOTAL) BY MOUTH 2 (TWO) TIMES A DAY. 120 capsule 1  . XARELTO 2.5 MG TABS tablet TAKE 1 TABLET BY MOUTH TWICE A DAY 60 tablet 3   No current facility-administered medications for this encounter.     Allergies:   Tussionex pennkinetic  er [hydrocod polst-cpm polst er] and Ace inhibitors   Social History:  The patient  reports that he quit smoking about 16 years ago. His smoking use included cigarettes and pipe. He has a 60.00 pack-year smoking history. He has never used smokeless tobacco. He reports that he does not drink alcohol or use drugs.   Family History:  The patient's family history includes Cancer in his mother and sister; Colon cancer in his sister;  Diabetes in his daughter, mother, and sister; Heart attack in his father and maternal grandmother; Heart disease in his father, mother, and sister; Hyperlipidemia in his mother and sister; Hypertension in his father, mother, and sister; Lung cancer in his mother; Parkinson's disease in his sister.   ROS:  Please see the history of present illness.   All other systems are personally reviewed and negative.   Exam:   BP 109/79   Pulse 61   Wt 123 kg (271 lb 3.2 oz)   SpO2 98%   BMI 51.67 kg/m  General: NAD Neck: No JVD, no thyromegaly or thyroid nodule.  Lungs: Distant BS. CV: Nondisplaced PMI.  Heart regular S1/S2, no S3/S4, 1/6 SEM RUSB.  No peripheral edema.  No carotid bruit.  Normal pedal pulses.  Abdomen: Soft, nontender, no hepatosplenomegaly, no distention.  Skin: Intact without lesions or rashes.  Neurologic: Alert and oriented x 3.  Psych: Normal affect. Extremities: No clubbing or cyanosis.  HEENT: Normal.    Recent Labs: 10/15/2018: Magnesium 2.3 04/10/2019: Hemoglobin 13.7; Platelets 154 07/08/2019: BUN 34; Creatinine, Ser 1.22; Potassium 4.1; Sodium 139 09/09/2019: ALT 19 10/06/2019: TSH 4.041  Personally reviewed   Wt Readings from Last 3 Encounters:  10/06/19 123 kg (271 lb 3.2 oz)  07/08/19 122.5 kg (270 lb)  06/12/19 121.4 kg (267 lb 9.6 oz)      ASSESSMENT AND PLAN:  1. Chronic systolic CHF: Ischemic cardiomyopathy. Echo in 8/18 showed EF 30% with severe LV dilation and normal RV.  [employer] CRT-D.  RHC in 1/19 showed normal filling pressures and preserved cardiac output.  Last echo in 7/20 showed EF 35-40% with mildly decreased RV systolic function. NYHA class III symptoms, stable.  He is not volume overloaded; I think that his dyspnea is primarily lung related (COPD, lung radiation).  I suspect low percentage of BiV pacing (88%) is due to PVCs.  - Continue torsemide 40 qpm, 20 qpm with BMET today.  - Increase Coreg to 25 mg bid with frequent PVCs and  only 88% BiV pacing.     - Continue Entresto to 49/51 bid.     - Continue spironolactone 25 mg daily.  2. CAD s/p CABG: No chest pain. LHC 1/19 with stable anatomy (LAD is patent, other vessels occluded with collaterals from the LAD). - Continue 81 mg aspirin daily and atorvastatin.  Good lipids in 9/20.  - Continue Xarelto 2.5 mg bid.  3. VT s/p ICD: On amiodarone for history of ICD discharges due to VT.   - Check TSH and LFTs. Knows he needs yearly eye exams.   4. Morbid obesity: Still needs to work on diet/weight loss.  5. COPD: Moderate to severe.  I think that a lot of his dyspnea is related to COPD.   - Followup with pulmonary. - Now has home oxygen.  - Needs to use Trelegy every day rather than prn.  6. Lung cancer: He completed another round of radiation in 9/20.  7. PAD: Occluded left SFA and significant right SFA stenosis on   last CTA.  ABIs in 3/20 with significant disease bilaterally but stable.  Does not get leg pain with exertion but has nocturnal cramps.  [facility] by [facility], medical management for now.  He is on statin, good lipids in 9/20.  8. AAA: s/p repair.  Followed at [facility].  No endoleak on last evaluation.  9. Tremor: Most likely essential tremor given family history.  However, cannot rule out role for amiodarone. As above, I think that he needs to continue at least a low dose of amiodarone.  Dr. Tat with neurology discussed deep brain stimulation => this is on hold while he is being treated for lung cancer.   10. Hyperlipidemia: He is on Vascepa and atorvastatin.  - Good lipids in 9/20.  11. PVCs: Frequent PVCs noted on device interrogation, suspect that this decreases the percentage of BiV pacing and also my contribute to low EF.  - Continue amiodarone.  - Increase Coreg to 25 mg bid as above.   Followup in 3 months.   Signed, Dalton McLean, MD  10/06/2019  [facility] [address] [facility] [place] [address]  [phone] (office) [phone] (fax) 

## 2019-10-06 NOTE — Telephone Encounter (Signed)
Saw patient in clinic and they were sent a letter from [employer] stating that he only had around $77 left in fund. Went ahead and signed him up for the waiting list as [employer] was closed. Explained to them because we are at the end of the year, we will use up the [employer] fund and then seek [employer] help. Patient's current co-pay for Delene Loll is $28.82.  Charlann Boxer, CPhT

## 2019-10-06 NOTE — Progress Notes (Signed)
Received office referral from [name] for this pt to participate in Pulmonary rehab.  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  Have asked for new referral with the diagnosis for combined CHF.  Pt does not have recent PFT with post bronch results per medicare a/b criteria guidelines for COPD.  Await new referral.  Pt seen in follow up today by [name] in the heart failure clinic.  Pt is also seen by [name] recently on 6/23 for dyspnea/COPD, lung CA, OSA. Pt is appropriate for scheduling.  Pt with Covid Risk Score 9.  Will forward to support staff for insurance benefits for Combined CHF.  Await new referral. Maurice Small RN, BSN Cardiac and Pulmonary Rehab Nurse Navigator

## 2019-10-06 NOTE — Patient Instructions (Addendum)
INCREASE Coreg to 25mg  (1 tab) twice a day  USE Trelogy EVERY DAY   Labs today We will only contact you if something comes back abnormal or we need to make some changes. Otherwise no news is good news!  You have been referred to Pulmonary Rehab at [facility].  They will call you to schedule an appointment.  EKG done in office today  Your physician recommends that you schedule a follow-up appointment in: 3 months with Dr Aundra Dubin  At the [facility], you and your health needs are our priority. As part of our continuing mission to provide you with exceptional heart care, we have created designated Provider Care Teams. These Care Teams include your primary Cardiologist (physician) and Advanced Practice Providers (APPs- Physician Assistants and Nurse Practitioners) who all work together to provide you with the care you need, when you need it.   You may see any of the following providers on your designated Care Team at your next follow up: Marland Kitchen Dr [name] . Dr [name] . Darrick Grinder, NP . Lyda Jester, PA   Please be sure to bring in all your medications bottles to every appointment.

## 2019-10-06 NOTE — Telephone Encounter (Signed)
Pt insurance is active and benefits verified through Medicare A/B Co-pay $0.00, DED $198.00/$198.00 met, out of pocket $0.00/$0.00 met, co-insurance 20%. No pre-authorization required.  10/06/2019 @ 415PM  2ndary insurance is active and benefits verified through [place]. Co-pay $0.00, DED $0.00/$0.00 met, out of pocket $0.00/$0.00 met, co-insurance 0%. No pre-authorization required.  10/06/2019 @ 416PM

## 2019-10-17 ENCOUNTER — Telehealth (HOSPITAL_COMMUNITY): Payer: Self-pay

## 2019-10-24 ENCOUNTER — Telehealth (HOSPITAL_COMMUNITY): Payer: Self-pay

## 2019-10-24 ENCOUNTER — Other Ambulatory Visit (HOSPITAL_COMMUNITY): Payer: Self-pay | Admitting: Cardiology

## 2019-10-29 ENCOUNTER — Other Ambulatory Visit: Payer: Self-pay | Admitting: Cardiology

## 2019-10-30 ENCOUNTER — Telehealth (HOSPITAL_COMMUNITY): Payer: Self-pay

## 2019-11-03 ENCOUNTER — Ambulatory Visit (HOSPITAL_COMMUNITY): Payer: Medicare Other

## 2019-11-03 ENCOUNTER — Telehealth (HOSPITAL_COMMUNITY): Payer: Self-pay | Admitting: Internal Medicine

## 2019-11-04 ENCOUNTER — Ambulatory Visit (HOSPITAL_COMMUNITY): Payer: Medicare Other

## 2019-11-08 ENCOUNTER — Other Ambulatory Visit (HOSPITAL_COMMUNITY): Payer: Self-pay | Admitting: Cardiology

## 2019-11-08 ENCOUNTER — Other Ambulatory Visit (HOSPITAL_COMMUNITY): Payer: Self-pay | Admitting: Internal Medicine

## 2019-11-21 ENCOUNTER — Ambulatory Visit (INDEPENDENT_AMBULATORY_CARE_PROVIDER_SITE_OTHER): Payer: Medicare Other | Admitting: *Deleted

## 2019-11-21 DIAGNOSIS — Z95810 Presence of automatic (implantable) cardiac defibrillator: Secondary | ICD-10-CM | POA: Diagnosis not present

## 2019-11-21 LAB — CUP PACEART REMOTE DEVICE CHECK
Battery Remaining Longevity: 60 mo
Battery Remaining Percentage: 82 %
Brady Statistic RA Percent Paced: 0 %
Brady Statistic RV Percent Paced: 89 %
Date Time Interrogation Session: 20201211033400
HighPow Impedance: 46 Ohm
Implantable Lead Implant Date: 20060411
Implantable Lead Implant Date: 20151030
Implantable Lead Implant Date: 20151030
Implantable Lead Location: 753858
Implantable Lead Location: 753859
Implantable Lead Location: 753860
Implantable Lead Model: 148
Implantable Lead Model: 4136
Implantable Lead Serial Number: 145070
Implantable Lead Serial Number: 29713415
Implantable Pulse Generator Implant Date: 20151030
Lead Channel Impedance Value: 569 Ohm
Lead Channel Impedance Value: 612 Ohm
Lead Channel Impedance Value: 684 Ohm
Lead Channel Pacing Threshold Amplitude: 0.8 V
Lead Channel Pacing Threshold Amplitude: 0.8 V
Lead Channel Pacing Threshold Amplitude: 2.1 V
Lead Channel Pacing Threshold Pulse Width: 0.4 ms
Lead Channel Pacing Threshold Pulse Width: 0.4 ms
Lead Channel Pacing Threshold Pulse Width: 0.9 ms
Lead Channel Setting Pacing Amplitude: 2 V
Lead Channel Setting Pacing Amplitude: 2 V
Lead Channel Setting Pacing Amplitude: 3.5 V
Lead Channel Setting Pacing Pulse Width: 0.4 ms
Lead Channel Setting Pacing Pulse Width: 0.9 ms
Lead Channel Setting Sensing Sensitivity: 0.6 mV
Lead Channel Setting Sensing Sensitivity: 1 mV
Pulse Gen Serial Number: 370874

## 2019-11-27 ENCOUNTER — Encounter: Payer: Self-pay | Admitting: Internal Medicine

## 2019-11-28 ENCOUNTER — Other Ambulatory Visit (HOSPITAL_COMMUNITY): Payer: Self-pay | Admitting: Internal Medicine

## 2019-12-02 ENCOUNTER — Ambulatory Visit (INDEPENDENT_AMBULATORY_CARE_PROVIDER_SITE_OTHER): Payer: Medicare Other | Admitting: Internal Medicine

## 2019-12-02 ENCOUNTER — Other Ambulatory Visit: Payer: Self-pay

## 2019-12-02 DIAGNOSIS — R911 Solitary pulmonary nodule: Secondary | ICD-10-CM | POA: Diagnosis not present

## 2019-12-02 DIAGNOSIS — G4733 Obstructive sleep apnea (adult) (pediatric): Secondary | ICD-10-CM

## 2019-12-02 DIAGNOSIS — J449 Chronic obstructive pulmonary disease, unspecified: Secondary | ICD-10-CM | POA: Diagnosis not present

## 2019-12-02 DIAGNOSIS — Z9989 Dependence on other enabling machines and devices: Secondary | ICD-10-CM | POA: Diagnosis not present

## 2019-12-02 MED ORDER — TRELEGY ELLIPTA 100-62.5-25 MCG/INH IN AEPB: 1.0000 | INHALATION_SPRAY | Freq: Every day | RESPIRATORY_TRACT | 12 refills | Status: DC

## 2019-12-02 NOTE — Progress Notes (Signed)
HPI 73 year old male former smoker followed for dyspnea/COPD, lung cancer/ XRT, OSA complicated by CAD/CHF/MI/AICD/CM/AAA, AICD, morbid obesity DM 2 HBP, GERD, tremor/ deep brainstimulator NPSG 11/05/07- [place]- AHI 14.6/ hr, desaturation to 84%, body weight 279 lbs PET scan 10/25/2018-  progressive increased soft tissue within the right upper lobe suspicious for recurrent tumor. -------------------------------------------------------------------   06/03/2019- 73 year old male former smoker followed for dyspnea/COPD, lung nodules/ XRT, OSA complicated by CAD/CHF/MI/AICD/CM/AAA, morbid obesity,  DM 2 HBP, GERD, tremor/ deep brain stimulator CPAP 12 / [employer]/ [facility]HP  O2 2L. Likes his CPAP. Download 97% compliance, AHI 1.2/ hr Cardiology continues to follow for CM/ dCHF Rad/Onc continues to follow for RUL nodule/ mass after XRT(2 rounds), empirically Rxd as CA w/o BX. -----OSA on CPAP  Doing well w CPAP. Unclear why DME hasn't bled in his O2. Gets very SOB w trivial exertion of ADLs when off O2- given permission to stay on O2 . Inhalers work well. Needs a maintenance inhaler- will sample Trelegy  12/02/2019- Virtual Visit via Telephone Note  I connected with [name] on 12/02/19 at 10:00 AM EST by telephone and verified that I am speaking with the correct person using two identifiers.  Location: Patient: H Provider: O   I discussed the limitations, risks, security and privacy concerns of performing an evaluation and management service by telephone and the availability of in person appointments. I also discussed with the patient that there may be a patient responsible charge related to this service. The patient expressed understanding and agreed to proceed.   History of Present Illness: 73 year old male former smoker followed for dyspnea/COPD, lung nodules/ XRT, OSA complicated by CAD/CHF/MI/AICD/CM/AAA, morbid obesity,  DM 2 HBP, GERD, tremor/ deep brain stimulator CPAP 12 / [employer]/  AHP  O2 2- 3 L. Download compliance 97%, AHI 2.1/ hr Albuterol hfa, neb albuterol, neb Brovana, Atrovent HFA, neb Pulmicort, Trelegy,  Lonhala Magnair, Pred 10 mg daily,  Says CPAP machine broke. Still using O2 Continues Rad Onc f/u for relapsed lung mass presumed malignant.  Trying to walk more.    Observations/Objective: PET 07/23/2019- IMPRESSION: 1. Hypermetabolic left upper lobe nodule, most consistent with bronchogenic carcinoma. 2. No additional abnormal hypermetabolism in the neck, chest, abdomen or pelvis. 3. Cirrhosis. 4. Cholelithiasis. 5. Right renal stone. 6. 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 7.  Aortic atherosclerosis (ICD10-170.0).  CT chest 06/11/2019- IMPRESSION: 1. Progressive changes of external beam radiation within the right upper lobe. The previously described central area of increased soft tissue is mildly increased from 09/16/2018. The appearance is nonspecific in may reflect progressive changes of external beam radiation. The second area reported previously up progressive increased soft tissue is no longer visualized and may have resolved or is obscured by progressive radiation changes 2. Left upper lobe lung lesion has clearly increased in size and appears more solid on today's study and is worrisome for progressive disease. 3. Aortic Atherosclerosis (ICD10-I70.0) and Emphysema (ICD10-J43.9).   Assessment and Plan: OSA- replace broken machine auto 10-15 COPD - refill Trelegy Lung Cancer- progression LUL after XRT R lung with radiation fibrosis  Follow Up Instructions: 6 months   I discussed the assessment and treatment plan with the patient. The patient was provided an opportunity to ask questions and all were answered. The patient agreed with the plan and demonstrated an understanding of the instructions.   The patient was advised to call back or seek an in-person evaluation if the symptoms worsen or if the condition fails to improve as  anticipated.  I provided 21 minutes of non-face-to-face time during this encounter.   Baird Lyons, MD    ROS-see HPI    + = positive Constitutional:  +  weight loss, night sweats, fevers, chills, fatigue, lassitude. HEENT:   No-  headaches, difficulty swallowing, tooth/dental problems, sore throat,       No-  sneezing, itching, ear ache, nasal congestion, post nasal drip,  CV:  No-   chest pain, +orthopnea, PND, +swelling in lower extremities, No-anasarca, dizziness, palpitations Resp: + shortness of breath with exertion or at rest.              No-   productive cough,  No non-productive cough,  No- coughing up of blood.              No-   change in color of mucus.  No- wheezing.   Skin: No-   rash or lesions. GI:  No-   heartburn, indigestion, abdominal pain, nausea, vomiting, GU: . MS:  + joint pain or swelling.  Neuro-     nothing unusual Psych:  No- change in mood or affect. No depression or anxiety.  No memory loss.  OBJ- Physical Exam   97% on O2 3L. +Wheelchair  General- Alert, Oriented, Affect-appropriate, Distress- none acute, + morbidly obese  Skin-+ ecchymoses on arms. Lymphadenopathy- none Head- atraumatic            Eyes- Gross vision intact, PERRLA, conjunctivae and secretions clear            Ears- Hearing, canals-normal            Nose- clear, no-Septal dev, mucus, polyps, erosion, perforation             Throat- Mallampati III, mucosa clear , drainage- none, tonsils- atrophic Neck- flexible , trachea midline, no stridor , thyroid nl, carotid no bruit Chest - symmetrical excursion , unlabored           Heart/CV- RRR , no murmur , no gallop  , no rub, nl s1 s2                           - JVD- none , edema -none, stasis changes- none, varices- none           Lung-+unlabored shallow inspiratory effort, rales- none, wheeze+, cough- none ,                         dullness-none, rub- none,            Chest wall- sternotomy scar,  Pacemaker defibrillator L Abd-  Br/  Gen/ Rectal- Not done, not indicated Extrem- cyanosis- none, clubbing, none, atrophy- none, strength- nl, vein donor scars Neuro- +head bob tremor

## 2019-12-02 NOTE — Patient Instructions (Signed)
I have taken Lonhala, Brovana and Pulmicort nebulizer meds off your list- you aren't using them anymore.  Refill script sent for Trelegy maintenance inhaler   Glad to hear you are pacing yourself and getting along. Have a Merry Christmas, and please call if we can help.

## 2019-12-09 ENCOUNTER — Telehealth: Payer: Self-pay | Admitting: *Deleted

## 2019-12-09 NOTE — Telephone Encounter (Signed)
Called patient to inform of CT for 12-19-19 - arrival time- 1:15 pm @ [facility], no restrictions to test, patient to receive results from Dr. Sondra Come on 12-22-19 @ 10:30 am, lvm for a return call

## 2019-12-11 ENCOUNTER — Other Ambulatory Visit (HOSPITAL_COMMUNITY): Payer: Self-pay | Admitting: Cardiology

## 2019-12-15 ENCOUNTER — Other Ambulatory Visit (HOSPITAL_COMMUNITY): Payer: Self-pay | Admitting: Cardiology

## 2019-12-19 ENCOUNTER — Telehealth (HOSPITAL_COMMUNITY): Payer: Self-pay | Admitting: Pharmacist

## 2019-12-19 ENCOUNTER — Other Ambulatory Visit: Payer: Self-pay

## 2019-12-19 ENCOUNTER — Encounter (HOSPITAL_COMMUNITY): Payer: Self-pay

## 2019-12-19 ENCOUNTER — Ambulatory Visit (HOSPITAL_COMMUNITY)
Admission: RE | Admit: 2019-12-19 | Discharge: 2019-12-19 | Disposition: A | Discharge status: Home / Self Care | Payer: Medicare Other | Source: Ambulatory Visit | Attending: Radiation Oncology | Admitting: Radiation Oncology

## 2019-12-19 DIAGNOSIS — R911 Solitary pulmonary nodule: Secondary | ICD-10-CM | POA: Diagnosis not present

## 2019-12-19 DIAGNOSIS — C3490 Malignant neoplasm of unspecified part of unspecified bronchus or lung: Secondary | ICD-10-CM | POA: Diagnosis not present

## 2019-12-19 NOTE — Telephone Encounter (Addendum)
[place] for copay assistance for [employer].  Member ID: [id number] Group ID: [id number] RxBin ID: [id number] PCN: [id number] Eligibility Start Date: 12/23/2019 Eligibility End Date: 12/21/2020 Assistance Amount: $1,000.00  Updated pharmacy with new card information.  Audry Riles, PharmD, BCPS, BCCP, CPP [facility] Pharmacist [phone]

## 2019-12-22 ENCOUNTER — Encounter: Payer: Self-pay | Admitting: Radiation Oncology

## 2019-12-22 ENCOUNTER — Other Ambulatory Visit: Payer: Self-pay

## 2019-12-22 ENCOUNTER — Ambulatory Visit
Admission: RE | Admit: 2019-12-22 | Discharge: 2019-12-22 | Disposition: A | Discharge status: Home / Self Care | Payer: Medicare Other | Source: Ambulatory Visit | Attending: Radiation Oncology | Admitting: Radiation Oncology

## 2019-12-22 VITALS — BP 109/47 | HR 66 | Temp 98.3°F | Resp 24 | Wt 293.2 lb

## 2019-12-22 DIAGNOSIS — Z7982 Long term (current) use of aspirin: Secondary | ICD-10-CM | POA: Insufficient documentation

## 2019-12-22 DIAGNOSIS — Z08 Encounter for follow-up examination after completed treatment for malignant neoplasm: Secondary | ICD-10-CM | POA: Diagnosis not present

## 2019-12-22 DIAGNOSIS — Z7901 Long term (current) use of anticoagulants: Secondary | ICD-10-CM | POA: Insufficient documentation

## 2019-12-22 DIAGNOSIS — Z923 Personal history of irradiation: Secondary | ICD-10-CM | POA: Diagnosis not present

## 2019-12-22 DIAGNOSIS — R911 Solitary pulmonary nodule: Secondary | ICD-10-CM | POA: Diagnosis not present

## 2019-12-22 DIAGNOSIS — Z79899 Other long term (current) drug therapy: Secondary | ICD-10-CM | POA: Insufficient documentation

## 2019-12-22 DIAGNOSIS — Z9981 Dependence on supplemental oxygen: Secondary | ICD-10-CM | POA: Diagnosis not present

## 2019-12-22 NOTE — Progress Notes (Signed)
Radiation Oncology         [phone] ________________________________  Name: [name] MRN: [id number]  Date: 12/22/2019  DOB: Oct 07, 1946  Follow-Up Visit Note  CC: [name], MD  [name], MD    ICD-10-CM   1. Nodule of upper lobe of left lung  R91.1     Diagnosis: PET positive pulmonary nodule in the left upper lobe  Interval Since Last Radiation: Four months and one day.  08/14/2019 through 08/21/2019 Site Technique Total Dose Dose per Fx Completed Fx Beam Energies  [place]: Lung_Lt SBRT 54/54 18 3/3 6XFFF   12/09/2018 - 01/20/2019: Right lung (retx) / 60 Gy in 30 fractions (IMRT)  2/25, 2/27, 02/08/18: Right lower lung / 54 Gy in 3 fractions(SBRT)  4/30, 5/3, 04/17/17: Rightupper lung/ 54 Gy in 3 fractions (SBRT)  Narrative:  The patient returns today for routine follow-up. He is doing well overall.  Since last visit, patient had a CT of chest done on 12/19/2019 that showed marked interval decrease in the size of the left upper lobe pulmonary nodule seen to be hypermetabolic on PET-CT of 79/01/4096. There was also interval evolution of post treatment changes in the right lung consistent with prior radiation therapy.   On review of systems, he reports breathing is stable. He denies significant cough chest pain or hemoptysis.  Continues on 3 L of oxygen.  ALLERGIES:  is allergic to tussionex pennkinetic er [hydrocod polst-cpm polst er] and ace inhibitors.  Meds: Current Outpatient Medications  Medication Sig Dispense Refill  . albuterol (PROVENTIL HFA;VENTOLIN HFA) 108 (90 BASE) MCG/ACT inhaler Inhale 2 puffs into the lungs every 6 (six) hours as needed for wheezing or shortness of breath. 3 Inhaler 4  . albuterol (PROVENTIL) (2.5 MG/3ML) 0.083% nebulizer solution USE 1 VIAL IN NEBULIZER 4 TIMES DAILY 75 mL 11  . amiodarone (PACERONE) 200 MG tablet TAKE ONE-HALF TABLET BY  MOUTH DAILY 45 tablet 3  . aspirin 81 MG tablet Take 81 mg by mouth daily.    Marland Kitchen  atorvastatin (LIPITOR) 80 MG tablet TAKE 1 TABLET BY MOUTH EVERY DAY 90 tablet 2  . ATROVENT HFA 17 MCG/ACT inhaler TAKE 2 PUFFS BY MOUTH EVERY 4 HOURS AS NEEDED FOR WHEEZE  12  . budesonide (PULMICORT) 0.5 MG/2ML nebulizer solution Take 2 mLs (0.5 mg total) by nebulization 2 (two) times daily. 120 mL 5  . carvedilol (COREG) 25 MG tablet Take 1 tablet (25 mg total) by mouth 2 (two) times daily with a meal. 180 tablet 1  . clopidogrel (PLAVIX) 75 MG tablet TAKE 1 TABLET BY MOUTH  DAILY 90 tablet 3  . ENTRESTO 49-51 MG TAKE 1 TABLET BY MOUTH TWICE A DAY 60 tablet 6  . finasteride (PROSCAR) 5 MG tablet Take 5 mg by mouth daily.    . fluticasone (FLONASE) 50 MCG/ACT nasal spray Instill 1 spray in each  nostril daily 32 g 3  . Fluticasone-Umeclidin-Vilant (TRELEGY ELLIPTA) 100-62.5-25 MCG/INH AEPB Inhale 1 puff into the lungs daily. 1 each 0  . Fluticasone-Umeclidin-Vilant (TRELEGY ELLIPTA) 100-62.5-25 MCG/INH AEPB Inhale 1 puff into the lungs daily. Rinse mouth 60 each 12  . KLOR-CON M20 20 MEQ tablet TAKE 1 TABLET BY MOUTH EVERY DAY 90 tablet 2  . levothyroxine (SYNTHROID) 50 MCG tablet TAKE 1 TABLET BY MOUTH  DAILY BEFORE BREAKFAST 90 tablet 0  . nitroGLYCERIN (NITROSTAT) 0.4 MG SL tablet DISSOLVE 1 TABLET UNDER THE TONGUE EVERY 5 MINUTES AS  NEEDED FOR CHEST PAIN ,  MAX 3 TABLETS IN 15 MIN, CALL  911 IF CHEST PAIN PERSISTS 25 tablet 0  . NON FORMULARY at bedtime. CPAP And during the day as needed    . spironolactone (ALDACTONE) 25 MG tablet TAKE 1 TABLET BY MOUTH  DAILY 90 tablet 3  . torsemide (DEMADEX) 20 MG tablet Take 1 pill by mouth every morning and 2 tablets by mouth every evening    . VASCEPA 1 g capsule TAKE 2 CAPSULES (2 G TOTAL) BY MOUTH 2 (TWO) TIMES A DAY. 120 capsule 1  . XARELTO 2.5 MG TABS tablet TAKE 1 TABLET BY MOUTH TWICE A DAY 60 tablet 3  . arformoterol (BROVANA) 15 MCG/2ML NEBU Take 2 mLs (15 mcg total) by nebulization 2 (two) times daily. (Patient not taking: Reported on  12/02/2019) 120 mL 5  . atorvastatin (LIPITOR) 40 MG tablet TAKE 1 TABLET BY MOUTH  DAILY (Patient not taking: Reported on 12/02/2019) 90 tablet 3  . diazepam (VALIUM) 5 MG tablet 1-2 tabs every 8 hours if needed for tremor (Patient not taking: Reported on 12/02/2019) 30 tablet 5  . ezetimibe (ZETIA) 10 MG tablet Take 1 tablet (10 mg total) by mouth daily. 90 tablet 3  . Glycopyrrolate (LONHALA MAGNAIR REFILL KIT) 25 MCG/ML SOLN Inhale 1 Act into the lungs 2 (two) times daily. 60 mL 11  . isosorbide mononitrate (IMDUR) 60 MG 24 hr tablet TAKE 1 TABLET BY MOUTH  DAILY (Patient not taking: Reported on 12/22/2019) 90 tablet 3  . predniSONE (DELTASONE) 10 MG tablet 1 tablet daily (Patient not taking: Reported on 12/22/2019) 30 tablet 0   No current facility-administered medications for this encounter.    Physical Findings: The patient is in no acute distress. Patient is alert and oriented.  weight is 293 lb 3.2 oz (133 kg). His temperature is 98.3 F (36.8 C). His blood pressure is 109/47 (abnormal) and his pulse is 66. His respiration is 24 (abnormal) and oxygen saturation is 99%. .  No significant changes. Lungs are clear to auscultation bilaterally. Heart has regular rate and rhythm. No palpable cervical, supraclavicular, or axillary adenopathy. Abdomen soft, non-tender, normal bowel sounds.  No wheezing.   Lab Findings: Lab Results  Component Value Date   WBC 6.7 04/10/2019   HGB 13.7 04/10/2019   HCT 43.4 04/10/2019   MCV 96.4 04/10/2019   PLT 154 04/10/2019    Radiographic Findings: CT Chest Wo Contrast  Result Date: 12/19/2019 CLINICAL DATA:  Non-small-cell lung cancer.  Restaging. EXAM: CT CHEST WITHOUT CONTRAST TECHNIQUE: Multidetector CT imaging of the chest was performed following the standard protocol without IV contrast. COMPARISON:  PET-CT 07/23/2019.  Chest CT 06/11/2019. FINDINGS: Cardiovascular: The heart size is normal. No substantial pericardial effusion. The patient is  status post CABG. Left permanent pacemaker noted. Atherosclerotic calcification is noted in the wall of the thoracic aorta. Mediastinum/Nodes: No mediastinal lymphadenopathy. No evidence for gross hilar lymphadenopathy although assessment is limited by the lack of intravenous contrast on today's study. Fluid in the distal esophagus may be related to dysmotility or reflux. There is no axillary lymphadenopathy. Lungs/Pleura: Centrilobular emphsyema noted. Similar appearance posterior right apical scarring. Radiation fibrosis in the right upper lobe encompasses fiducial markers in there has been interval evolution with post treatment changes now appearing more retracted and confluent. Small area of bronchiectasis and scarring in the posterior right middle lobe and infrahilar right lower lobe is stable. Tiny calcified granulomata noted in the right lung. Interval decrease in size of the posterior left  upper lobe pulmonary nodule, measuring 6 mm today on image 37/7 compared to 15 mm on the PET-CT of 07/23/2019. No new suspicious pulmonary nodule or mass.  No pleural effusion. Upper Abdomen: Unremarkable Musculoskeletal: No worrisome lytic or sclerotic osseous abnormality. IMPRESSION: 1. Marked interval decrease in size of the left upper lobe pulmonary nodule seen to be hypermetabolic on PET-CT of 82/42/3536. 2. Interval evolution of post treatment changes in the right lung consistent with prior radiation therapy. 3.  Emphysema. (ICD10-J43.9) 4.  Aortic Atherosclerois (ICD10-170.0) Electronically Signed   By: Misty Stanley M.D.   On: 12/19/2019 14:42    Impression:  PET positive pulmonary nodule in the left upper lobe.  Recent chest CT scan shows significant response to his stereotactic body radiation therapy for his PET positive left upper lobe pulmonary nodule.  No new other issues within the chest.  Plan: Routine follow-up in 6 months.  Prior to this follow-up the patient will undergo a CT scan of the  chest.  ____________________________________   Blair Promise, PhD, MD  This document serves as a record of services personally performed by Gery Pray, MD. It was created on his behalf by Clerance Lav, a trained medical scribe. The creation of this record is based on the scribe's personal observations and the provider's statements to them. This document has been checked and approved by the attending provider.

## 2019-12-22 NOTE — Progress Notes (Addendum)
Mr Heidelberger is a  follow appointment today.Patient states that he has some fatigue. Patient states that he has sob with activity and at rest. Patient denies any coughing. Patient states that his appetite   is  good. Patient denies any difficulty with swallowing.Patient denies any issues with his skin. Patients blood pressure was low states that it is always low. Vitals:   12/22/19 1048  BP: (!) 109/47  Pulse: 66  Resp: (!) 24  Temp: 98.3 F (36.8 C)  SpO2: 99%  Weight: 293 lb 3.2 oz (133 kg)  PF: (!) 3 L/min

## 2019-12-27 NOTE — Progress Notes (Signed)
ICD remote 

## 2020-01-02 ENCOUNTER — Other Ambulatory Visit (HOSPITAL_COMMUNITY): Payer: Self-pay | Admitting: Cardiology

## 2020-01-05 ENCOUNTER — Telehealth (HOSPITAL_COMMUNITY): Payer: Self-pay

## 2020-01-05 NOTE — Telephone Encounter (Signed)

## 2020-01-06 ENCOUNTER — Ambulatory Visit (HOSPITAL_COMMUNITY)
Admission: RE | Admit: 2020-01-06 | Discharge: 2020-01-06 | Disposition: A | Discharge status: Home / Self Care | Payer: Medicare Other | Source: Ambulatory Visit | Attending: Cardiology | Admitting: Cardiology

## 2020-01-06 ENCOUNTER — Other Ambulatory Visit: Payer: Self-pay

## 2020-01-06 ENCOUNTER — Telehealth (HOSPITAL_COMMUNITY): Payer: Self-pay

## 2020-01-06 ENCOUNTER — Encounter (HOSPITAL_COMMUNITY): Payer: Self-pay | Admitting: Cardiology

## 2020-01-06 ENCOUNTER — Telehealth: Payer: Self-pay

## 2020-01-06 ENCOUNTER — Other Ambulatory Visit (HOSPITAL_COMMUNITY): Payer: Self-pay | Admitting: Cardiology

## 2020-01-06 VITALS — BP 107/74 | HR 73 | Wt 300.6 lb

## 2020-01-06 DIAGNOSIS — J449 Chronic obstructive pulmonary disease, unspecified: Secondary | ICD-10-CM | POA: Diagnosis not present

## 2020-01-06 DIAGNOSIS — I4819 Other persistent atrial fibrillation: Secondary | ICD-10-CM

## 2020-01-06 DIAGNOSIS — I252 Old myocardial infarction: Secondary | ICD-10-CM | POA: Insufficient documentation

## 2020-01-06 DIAGNOSIS — Z85118 Personal history of other malignant neoplasm of bronchus and lung: Secondary | ICD-10-CM | POA: Insufficient documentation

## 2020-01-06 DIAGNOSIS — E785 Hyperlipidemia, unspecified: Secondary | ICD-10-CM | POA: Diagnosis not present

## 2020-01-06 DIAGNOSIS — Z8349 Family history of other endocrine, nutritional and metabolic diseases: Secondary | ICD-10-CM | POA: Insufficient documentation

## 2020-01-06 DIAGNOSIS — I5042 Chronic combined systolic (congestive) and diastolic (congestive) heart failure: Secondary | ICD-10-CM

## 2020-01-06 DIAGNOSIS — Z95810 Presence of automatic (implantable) cardiac defibrillator: Secondary | ICD-10-CM | POA: Diagnosis not present

## 2020-01-06 DIAGNOSIS — Z7982 Long term (current) use of aspirin: Secondary | ICD-10-CM | POA: Diagnosis not present

## 2020-01-06 DIAGNOSIS — Z951 Presence of aortocoronary bypass graft: Secondary | ICD-10-CM | POA: Insufficient documentation

## 2020-01-06 DIAGNOSIS — Z79899 Other long term (current) drug therapy: Secondary | ICD-10-CM | POA: Insufficient documentation

## 2020-01-06 DIAGNOSIS — Z7951 Long term (current) use of inhaled steroids: Secondary | ICD-10-CM | POA: Diagnosis not present

## 2020-01-06 DIAGNOSIS — I255 Ischemic cardiomyopathy: Secondary | ICD-10-CM | POA: Insufficient documentation

## 2020-01-06 DIAGNOSIS — Z7902 Long term (current) use of antithrombotics/antiplatelets: Secondary | ICD-10-CM | POA: Insufficient documentation

## 2020-01-06 DIAGNOSIS — E039 Hypothyroidism, unspecified: Secondary | ICD-10-CM | POA: Diagnosis not present

## 2020-01-06 DIAGNOSIS — Z79890 Hormone replacement therapy (postmenopausal): Secondary | ICD-10-CM | POA: Diagnosis not present

## 2020-01-06 DIAGNOSIS — Z8249 Family history of ischemic heart disease and other diseases of the circulatory system: Secondary | ICD-10-CM | POA: Insufficient documentation

## 2020-01-06 DIAGNOSIS — R251 Tremor, unspecified: Secondary | ICD-10-CM | POA: Diagnosis not present

## 2020-01-06 DIAGNOSIS — I493 Ventricular premature depolarization: Secondary | ICD-10-CM | POA: Insufficient documentation

## 2020-01-06 DIAGNOSIS — Z9981 Dependence on supplemental oxygen: Secondary | ICD-10-CM | POA: Diagnosis not present

## 2020-01-06 DIAGNOSIS — I4891 Unspecified atrial fibrillation: Secondary | ICD-10-CM | POA: Diagnosis not present

## 2020-01-06 DIAGNOSIS — Z833 Family history of diabetes mellitus: Secondary | ICD-10-CM | POA: Insufficient documentation

## 2020-01-06 DIAGNOSIS — K219 Gastro-esophageal reflux disease without esophagitis: Secondary | ICD-10-CM | POA: Insufficient documentation

## 2020-01-06 DIAGNOSIS — Z7952 Long term (current) use of systemic steroids: Secondary | ICD-10-CM | POA: Insufficient documentation

## 2020-01-06 DIAGNOSIS — I110 Hypertensive heart disease with heart failure: Secondary | ICD-10-CM | POA: Insufficient documentation

## 2020-01-06 DIAGNOSIS — I5022 Chronic systolic (congestive) heart failure: Secondary | ICD-10-CM | POA: Insufficient documentation

## 2020-01-06 DIAGNOSIS — Z923 Personal history of irradiation: Secondary | ICD-10-CM | POA: Diagnosis not present

## 2020-01-06 DIAGNOSIS — I2510 Atherosclerotic heart disease of native coronary artery without angina pectoris: Secondary | ICD-10-CM | POA: Insufficient documentation

## 2020-01-06 DIAGNOSIS — G4733 Obstructive sleep apnea (adult) (pediatric): Secondary | ICD-10-CM | POA: Diagnosis not present

## 2020-01-06 DIAGNOSIS — Z888 Allergy status to other drugs, medicaments and biological substances status: Secondary | ICD-10-CM | POA: Insufficient documentation

## 2020-01-06 DIAGNOSIS — N400 Enlarged prostate without lower urinary tract symptoms: Secondary | ICD-10-CM | POA: Insufficient documentation

## 2020-01-06 DIAGNOSIS — E1151 Type 2 diabetes mellitus with diabetic peripheral angiopathy without gangrene: Secondary | ICD-10-CM | POA: Insufficient documentation

## 2020-01-06 DIAGNOSIS — Z801 Family history of malignant neoplasm of trachea, bronchus and lung: Secondary | ICD-10-CM | POA: Insufficient documentation

## 2020-01-06 DIAGNOSIS — Z820 Family history of epilepsy and other diseases of the nervous system: Secondary | ICD-10-CM | POA: Insufficient documentation

## 2020-01-06 DIAGNOSIS — Z87891 Personal history of nicotine dependence: Secondary | ICD-10-CM | POA: Insufficient documentation

## 2020-01-06 LAB — COMPREHENSIVE METABOLIC PANEL
ALT: 18 U/L (ref 0–44)
AST: 21 U/L (ref 15–41)
Albumin: 3.7 g/dL (ref 3.5–5.0)
Alkaline Phosphatase: 57 U/L (ref 38–126)
Anion gap: 11 (ref 5–15)
BUN: 54 mg/dL — ABNORMAL HIGH (ref 8–23)
CO2: 23 mmol/L (ref 22–32)
Calcium: 8.7 mg/dL — ABNORMAL LOW (ref 8.9–10.3)
Chloride: 105 mmol/L (ref 98–111)
Creatinine, Ser: 1.64 mg/dL — ABNORMAL HIGH (ref 0.61–1.24)
GFR calc Af Amer: 47 mL/min — ABNORMAL LOW (ref >60)
GFR calc non Af Amer: 40 mL/min — ABNORMAL LOW (ref >60)
Glucose, Bld: 149 mg/dL — ABNORMAL HIGH (ref 70–99)
Potassium: 4.7 mmol/L (ref 3.5–5.1)
Sodium: 139 mmol/L (ref 135–145)
Total Bilirubin: 0.6 mg/dL (ref 0.3–1.2)
Total Protein: 6.2 g/dL — ABNORMAL LOW (ref 6.5–8.1)

## 2020-01-06 LAB — MAGNESIUM: Magnesium: 2.4 mg/dL (ref 1.7–2.4)

## 2020-01-06 LAB — TSH: TSH: 6.916 u[IU]/mL — ABNORMAL HIGH (ref 0.350–4.500)

## 2020-01-06 MED ORDER — TORSEMIDE 20 MG PO TABS: ORAL_TABLET | ORAL | 5 refills | Status: DC

## 2020-01-06 MED ORDER — RIVAROXABAN 20 MG PO TABS: 20.0000 mg | ORAL_TABLET | Freq: Every day | ORAL | 5 refills | Status: DC

## 2020-01-06 NOTE — Progress Notes (Signed)
Date:  01/06/2020   ID:  [name], DOB 28-Jan-1946, MRN [id number]    Provider location: [place] [facility] Type of Visit: Established patient  PCP:  [name], MD  Cardiologist:  [name]  Chief Complaint: Shortness of breath   History of Present Illness: [name] is a 74 y.o. male who has a past medical history significant for morbid obesity, systolic heart failure with an EF of 30-35% (January 2014) -> 20-25% (March 2015 - RV normal), VT on amio, CAD s/p CABG 1996, LBBB, COPD, obstructive sleep apnea on CPAP, AAA repair (January 2015).   Admitted to [facility] July 24 through July 06 2014 with chest pain.  CEs negative. Had a LHC with no change from previous LHC with recommendations to continue medical management. Discharge weight was 255 pounds.   LHC 07/06/14 --No significant change from previous studies.  Left anterior descending (LAD): The LAD is a large vessel. There is a 40% stenosis immediately after the takeoff of a large septal perforator. There are 2 large diagonal branches without significant disease. Left circumflex (LCx): The LCx is occluded proximally. There are left to left collaterals.  Right coronary artery (RCA): The RCA is occluded proximally immediately following the conus branch. There are right to right and left to right collaterals. SVGs from CABG known to be totally occluded.   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   CTA chest/abd/pelvis (3/16) with moderate emphysema, 1.3 cm nodule RUL, left SFA totally occluded, right SFA with significant stenosis, s/p endovascular AAA repair (stable).   Echo (5/17) with EF 20-25%, severe LV dilation, mild MR, normal RV size with mildly decreased systolic function.   He has a RUL nodule that is most likely lung cancer, he is being treated with radiation.   Echo (8/18) with EF 30%, basal-mid inferior and inferolateral AK, severe LV dilation, normal RV, mild MR.   RHC/LHC was  done in 1/19 due to worsening exertional dyspnea.  This showed normal filling pressures and preserved cardiac output.  There was 30-40% mLAD stenosis.  The LAD provided collaterals to the LCx and RCA territories.  The LCx, RCA, SVG-OM, and SVG-PDA were all chronically occluded (known from the past).  No new disease.   [facility] in [facility]/19 at [facility] with 0.78 on right, 0.59 on left => left iliac > 50% stenosis.  11/19 ABIs 0.78 right, 0.59 left.  3/20 ABIs 0.74 right, 0.66 left.   He saw neurology about his tremor.  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  However, he is on amiodarone which may be contributing.  He has been seeing Dr. Vertell Limber with plan for deep brain stimulator, on hold currently due to cancer treatment.   He had chest radiation for RLL lung cancer.  However, CT showed new RUL lesion in 11/19 and he restart XRT in 12/19, repeat XRT is now completed.   Last CT chest showed a lesion in the left upper lobe.  He had XRT again, finishing in 9/20.   Echo in 7/20 showed EF 35-40%, basal to mid inferolateral and inferior akinesis, mildly decreased RV systolic function, mild AS, PASP 22 mmHg.    He returns for followup of CHF and CAD.  Weight is up 29 lbs.  He says that he has not been watching what he eats recently.  He is more short of breath, notes dyspnea walking to the mailbox or up stairs. Wearing oxygen 2 L [place] at all times, walks  slowly.  No orthopnea/PND.  He does not leg cramps when lying in bed.  He has generalized fatigue.  He does not feel palpitations, but ECG today showed atrial fibrillation.   [employer] device interrogation: 88% BiV pacing, no VT  ECG (personally reviewed): Atrial fibrillation, rate 107  Labs 3/15 Cr 1.5 K 5.6 Labs 07/06/14 K 3.9 Creatinine  0.98 Labs 9/15 K 5.1, creatinine 0.96 Labs 11/15 K 3.8, creatinine 0.83, LDL 61, LFTs normal, TSH normal Labs 11/26/14 K 5.0 Creatinine 0.73 , LFTs normal, TSH normal,  Labs 12/08/14 K 3.4 Creatinine 0.83  Labs  3/16 K 4.4, creatinine 1.03, LFTs normal, TSH normal, HCT 37.8, BNP 65 Labs 6/16 K 4.1, creatinine 0.93, TSH normal, LFTs normal Labs 11/16 K 5.1, creatinine 1.13, LFTs normal, TSH normal, LDL 83, HDL 33, TGs 162 Labs 1/17 K 4.4, creatinine 0.86, pro-BNP 154 Labs 2/17 BNP 113, K 3.8, creatinine 0.79, TSH normal, LFTs normal Labs 4/17 K 3.8, creatinine 0.91, HCT 48.1 Labs 6/17 K 3.5, creatinine 0.98 Labs 9/17 K 3.9, creatinine 0.79, LDL 69, HDL 29, TSH normal, LFTs normal Labs 3/18 K 3.6, creatinine 0.87 Labs 8/18 K 3.3 => 4.2, creatinine 0.78 => 1.04, LDL 66, HDL 38, BNP 166 Labs 9/18: K 4.7, creatinine 0.9, BNP 144 Labs 11/18: K 4.8, creatinine 1.05, TSH normal, LFTs normal.  Labs 1/19: K 4.4, creatinine 1.08, LFTs normal Labs 5/19: LDL 60, HDL 28, TSH normal, LFTs normal, K 4.6, creatinine 1.43 Labs 11/19: K 4.6, creatinine 1.42, LFTs normal, TSH normal, Mg 2.3 Labs 1/20: TSH normal, K 4.2, creatinine 1.12, LFTs normal Labs 4/20: LDL 42, HDL 28, TGs 234 Labs 5/20: K 4.8 => 4.4, creatinine 1.95 => 1.38, TSH normal, LFTs normal Labs 7/20: K 4.1, creatinine 1.22 Labs 9/20: LDL 36, HDL 29  Past Medical History:  Diagnosis Date  . AAA (abdominal aortic aneurysm) ([place])    followed by Dr. Bridgett Larsson  . Adenomatous colon polyp 01/2004  . Anemia    hx  . Automatic implantable cardioverter-defibrillator in situ   . AVM (arteriovenous malformation)   . BPH (benign prostatic hypertrophy)   . CAD (coronary artery disease)    s/p CABGx2 in 1996  . Carotid artery stenosis    LCEA - Dr. Bridgett Larsson in 2013  . CHF (congestive heart failure) ([place])   . Complication of anesthesia    claustrophobic, unabe to lie on back more than 4 hours at time due to back  . COPD (chronic obstructive pulmonary disease) ([place])   . Diabetes mellitus    DIET CONTROLLED- pt states that this was a misdiagnosis, he was treated while in the hospital  but returned home, loss a massive amount of weight and he has not had a problem  with his blood sugar since. States everything has been normal for about 3 years.  . Diverticulosis   . Dyspnea   . Dysrhythmia    ICD-defibrillator  . Fatigue   . GERD (gastroesophageal reflux disease)   . H/O hiatal hernia   . History of radiation therapy 04/09/17-04/17/17   SBRT right lung 54 Gy in 3 fractions  . HLD (hyperlipidemia)   . Hypertension   . Hypothyroidism   . Ischemic cardiomyopathy   . Myocardial infarction ([place])   . 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 OSA on CPAP    AHI durign total sleep 14.69/hr, during REM 50.91/hr  . Peripheral vascular disease ([facility])    LCEA, L renal artery stent, bilat  iliac stents, R SFA stenosis  . Tremor   . Ventricular tachycardia ([place]) 09/09/2014   Amiodarone was started after appropriate defibrillator shocks for ventricular tachycardia in October 2008   Past Surgical History:  Procedure Laterality Date  . ABDOMINAL AORTIC ENDOVASCULAR STENT GRAFT [name] 01/06/2014   Procedure: ABDOMINAL AORTIC ENDOVASCULAR STENT GRAFT- GORE; ULTRASOUND GUIDED;  Surgeon: Conrad Friendship, MD;  Location: [facility];  Service: Vascular;  Laterality: [name];  . ANGIOPLASTY     BILATERAL  LE  W/STENTS  . BI-VENTRICULAR IMPLANTABLE CARDIOVERTER DEFIBRILLATOR UPGRADE [name] 10/09/2014   Procedure: BI-VENTRICULAR IMPLANTABLE CARDIOVERTER DEFIBRILLATOR UPGRADE;  Surgeon: Evans Lance, MD;  Location: [facility] CATH LAB;  Service: Cardiovascular;  Laterality: [name];  . BIV ICD GENERTAOR CHANGE OUT  10/09/2014   UPGRADE TO BIV        BY DR Lovena Le  . 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 Adora Fridge)  . CARDIAC CATHETERIZATION  10/03/2002   SVG sequentially to OM1 & OM2 totally occluded at ostium, SVG to PDA totally occluded within previously placed prox vein graft stent, smal distal AAA, bialt iliac stents  with 30% left end-stent restenosis and 50% right end-stent restnosis(Dr. Gerrie Nordmann)  . CARDIAC CATHETERIZATION  06/11/1998   L main with 20% narrowing in distal 1/3; LAD with 1st diagonla having 70% ostial narrowing, 2nd diagonal with 40% narrowing in prox third, LIMA & RIMA widely patent; in-stent restenosis of RCIA with successful PTA and new prox SVTRCA stent for residual disease (Dr. Marella Chimes)  . CARDIAC DEFIBRILLATOR PLACEMENT  06/2005   [employer] Vitality HE - ischemic cardiomyopathy - Dr. Marella Chimes  . CAROTID ENDARTERECTOMY Left 01/04/12  . CORONARY ANGIOPLASTY  01/08/2004   cutting balloon atherectomy & percutaneous intervention of RCIA in-stent restenosis (Dr. Marella Chimes)  . CORONARY ARTERY BYPASS GRAFT  11/04/1985   x2 - PDA and sequential DX-OM (Dr. Redmond Pulling)  . ENDARTERECTOMY  01/04/2012   Procedure: ENDARTERECTOMY CAROTID;left  Surgeon: Hinda Lenis, MD;  Location: [facility];  Service: Vascular;  Laterality: Left;  with patch angioplasty  . FUDUCIAL PLACEMENT [name] 02/23/2017   Procedure: PLACEMENT OF FUDUCIAL;  Surgeon: [name], MD;  Location: [facility];  Service: Thoracic;  Laterality: [name];  . ICD GENERATOR CHANGE  05/02/2010   [employer], [employer]  . ILIAC ARTERY STENT Bilateral 03/1997   and L SFA PTA  . Iron infusion  June 16, 2012  . LEFT HEART CATHETERIZATION WITH CORONARY ANGIOGRAM [name] 07/06/2014   Procedure: LEFT HEART CATHETERIZATION WITH CORONARY ANGIOGRAM;  Surgeon: Peter M Martinique, MD;  Location[facility] C[facility] LAB;  Service: Cardiovascular;  Laterality: [name];  . NM MYOCAR PERF WALL MOTION  09/2012   lexiscan myoview - mod-severe perfusion defect r/t infarct or scar w/mild periinfarct ishcemia in basal inferior, mid inferior, apical inferior, basal inferolateral & mid inferoalteral regions - EF 21% low risk scan  . POLYPECTOMY    . RENAL ARTERY STENT Left 03/24/2004   [id number] Genesis stent (Dr. RMarella Chimes  . RIGHT/LEFT HEART CATH AND CORONARY ANGIOGRAPHY [name]  12/28/2017   Procedure: RIGHT/LEFT HEART CATH AND CORONARY ANGIOGRAPHY;  Surgeon: MLarey Dresser MD;  [facility];  Service: Cardiovascular;  Laterality: [name];  . TRANSTHORACIC ECHOCARDIOGRAM  12/2012   EF 30-35; LV mod to severely dilated, mod concentric hypertrophy,  severe hypokinesis of inferolateral myocardium, moderate hypokineis of anteroseptal region, grade 1 diastolic dysfunction; mod MR; LA mod-severely dialted; RV mod dialted; RA mildly dilated; PA peak pressure 32mHg  . VIDEO BRONCHOSCOPY WITH ENDOBRONCHIAL NAVIGATION [name] 02/23/2017   Procedure: VIDEO BRONCHOSCOPY WITH ENDOBRONCHIAL NAVIGATION;  Surgeon: SMelrose Nakayama MD;  [place]  Service: Thoracic;  Laterality: [name];     Current Outpatient Medications  Medication Sig Dispense Refill  . albuterol (PROVENTIL HFA;VENTOLIN HFA) 108 (90 BASE) MCG/ACT inhaler Inhale 2 puffs into the lungs every 6 (six) hours as needed for wheezing or shortness of breath. 3 Inhaler 4  . albuterol (PROVENTIL) (2.5 MG/3ML) 0.083% nebulizer solution USE 1 VIAL IN NEBULIZER 4 TIMES DAILY 75 mL 11  . amiodarone (PACERONE) 200 MG tablet TAKE ONE-HALF TABLET BY  MOUTH DAILY 45 tablet 3  . arformoterol (BROVANA) 15 MCG/2ML NEBU Take 2 mLs (15 mcg total) by nebulization 2 (two) times daily. 120 mL 5  . atorvastatin (LIPITOR) 80 MG tablet TAKE 1 TABLET BY MOUTH EVERY DAY 90 tablet 2  . ATROVENT HFA 17 MCG/ACT inhaler TAKE 2 PUFFS BY MOUTH EVERY 4 HOURS AS NEEDED FOR WHEEZE  12  . budesonide (PULMICORT) 0.5 MG/2ML nebulizer solution Take 2 mLs (0.5 mg total) by nebulization 2 (two) times daily. 120 mL 5  . carvedilol (COREG) 25 MG tablet Take 1 tablet (25 mg total) by mouth 2 (two) times daily with a meal. 180 tablet 1  . diazepam (VALIUM) 5 MG tablet 1-2 tabs every 8 hours if needed for tremor 30 tablet 5  . ENTRESTO 49-51 MG TAKE 1 TABLET BY MOUTH TWICE A DAY 60 tablet 6  . ezetimibe (ZETIA) 10 MG tablet Take 1 tablet (10 mg total)  by mouth daily. 90 tablet 3  . finasteride (PROSCAR) 5 MG tablet Take 5 mg by mouth daily.    . fluticasone (FLONASE) 50 MCG/ACT nasal spray Instill 1 spray in each  nostril daily 32 g 3  . Fluticasone-Umeclidin-Vilant (TRELEGY ELLIPTA) 100-62.5-25 MCG/INH AEPB Inhale 1 puff into the lungs daily. Rinse mouth 60 each 12  . Glycopyrrolate (LONHALA MAGNAIR REFILL KIT) 25 MCG/ML SOLN Inhale 1 Act into the lungs 2 (two) times daily. 60 mL 11  . isosorbide mononitrate (IMDUR) 60 MG 24 hr tablet TAKE 1 TABLET BY MOUTH  DAILY 90 tablet 3  . KLOR-CON M20 20 MEQ tablet TAKE 1 TABLET BY MOUTH EVERY DAY 90 tablet 2  . levothyroxine (SYNTHROID) 50 MCG tablet TAKE 1 TABLET BY MOUTH  DAILY BEFORE BREAKFAST 90 tablet 0  . nitroGLYCERIN (NITROSTAT) 0.4 MG SL tablet DISSOLVE 1 TABLET UNDER THE TONGUE EVERY 5 MINUTES AS  NEEDED FOR CHEST PAIN , MAX 3 TABLETS IN 15 MIN, CALL  911 IF CHEST PAIN PERSISTS 25 tablet 0  . NON FORMULARY at bedtime. CPAP And during the day as needed    . predniSONE (DELTASONE) 10 MG tablet 1 tablet daily 30 tablet 0  . spironolactone (ALDACTONE) 25 MG tablet TAKE 1 TABLET BY MOUTH  DAILY 90 tablet 3  . torsemide (DEMADEX) 20 MG tablet Take 4 tablets (80 mg total) by mouth every morning AND 3 tablets (60 mg total) every evening. 210 tablet 5  . VASCEPA 1 g capsule TAKE 2 CAPSULES (2 G TOTAL) BY MOUTH 2 (TWO) TIMES A DAY. 120 capsule 1  . rivaroxaban (XARELTO) 20 MG TABS tablet Take 1 tablet (20 mg total) by mouth daily with supper. 30 tablet 5   No current  facility-administered medications for this encounter.    Allergies:   Tussionex pennkinetic er [hydrocod polst-cpm polst er] and Ace inhibitors   Social History:  The patient  reports that he quit smoking about 17 years ago. His smoking use included cigarettes and pipe. He has a 60.00 pack-year smoking history. He has never used smokeless tobacco. He reports that he does not drink alcohol or use drugs.   Family History:  The patient's  family history includes Cancer in his mother and sister; Colon cancer in his sister; Diabetes in his daughter, mother, and sister; Heart attack in his father and maternal grandmother; Heart disease in his father, mother, and sister; Hyperlipidemia in his mother and sister; Hypertension in his father, mother, and sister; Lung cancer in his mother; Parkinson's disease in his sister.   ROS:  Please see the history of present illness.   All other systems are personally reviewed and negative.   Exam:   BP 107/74   Pulse 73   Wt (!) 136.4 kg (300 lb 9.6 oz)   SpO2 97% Comment: 3L of oxygen  BMI 57.27 kg/m  General: NAD Neck: JVP 10 cm, no thyromegaly or thyroid nodule.  Lungs: Clear to auscultation bilaterally with normal respiratory effort. CV: Nondisplaced PMI.  Heart regular S1/S2, no S3/S4, no murmur.  2+ edema to knees bilaterally.  No carotid bruit.  Normal pedal pulses.  Abdomen: Soft, nontender, no hepatosplenomegaly, no distention.  Skin: Intact without lesions or rashes.  Neurologic: Alert and oriented x 3.  Psych: Normal affect. Extremities: No clubbing or cyanosis.  HEENT: Normal.   Recent Labs: 04/10/2019: Hemoglobin 13.7; Platelets 154 01/06/2020: ALT 18; BUN 54; Creatinine, Ser 1.64; Magnesium 2.4; Potassium 4.7; Sodium 139; TSH 6.916  Personally reviewed   Wt Readings from Last 3 Encounters:  01/06/20 (!) 136.4 kg (300 lb 9.6 oz)  12/22/19 133 kg (293 lb 3.2 oz)  10/06/19 123 kg (271 lb 3.2 oz)      ASSESSMENT AND PLAN:  1. Chronic systolic CHF: Ischemic cardiomyopathy. Echo in 8/18 showed EF 30% with severe LV dilation and normal RV.  [employer] CRT-D.  RHC in 1/19 showed normal filling pressures and preserved cardiac output.  Last echo in 7/20 showed EF 35-40% with mildly decreased RV systolic function.  I suspect low percentage of BiV pacing (88%) is due to PVCs.  NYHA class III symptoms, worse recently.  Weight is up almost 30 lbs and he is volume overloaded on  exam.  Atrial fibrillation likely is making CHF worse.  - Increase torsemide to 80 qam/60 qpm with BMET today and in 10 days.  Cut back on sodium intake.  - Continue Coreg 25 mg bid.   - Continue Entresto to 49/51 bid.     - Continue spironolactone 25 mg daily.  - Refer to EP for device interrogation, can we increase his BiV pacing percentage?  - Wear graded compression stockings.  2. CAD s/p CABG: No chest pain. [place] 1/19 with stable anatomy (LAD is patent, other vessels occluded with collaterals from the LAD). - He will be starting full dose Xarelto for atrial fibrillation (20 mg daily) so I will stop ASA and Plavix.   - Continue atorvastatin.  3. VT s/p ICD: On amiodarone for history of ICD discharges due to VT.   - Check TSH and LFTs. Knows he needs yearly eye exams.   4. Morbid obesity: Needs to work on diet/weight loss.  5. COPD: Moderate to severe.  Historically, a lot  of his dyspnea has seemed to be due to COPD.   - Followup with pulmonary. - Now has home oxygen.  6. Lung cancer: He completed radiation.  7. PAD: Occluded left SFA and significant right SFA stenosis on last CTA.  ABIs in 3/20 with significant disease bilaterally but stable.  Does not get leg pain with exertion but has nocturnal cramps.  [facility] by [facility], medical management for now.  He is on statin, good lipids in 9/20.  8. AAA: s/p repair.  Followed at [facility].  No endoleak on last evaluation.  9. Tremor: Most likely essential tremor given family history.  However, cannot rule out role for amiodarone. As above, I think that he needs to continue at least a low dose of amiodarone.  Dr. Carles Collet with neurology discussed deep brain stimulation => this is on hold while he is being treated for lung cancer.   10. Hyperlipidemia: He is on Vascepa and atorvastatin.  - Good lipids in 9/20.  11. 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  - Continue  amiodarone.  - Continue Coreg 25 mg bid . 12. Atrial fibrillation: New diagnosis today.   - Continue Coreg and amiodarone.  - Increase Xarelto to 20 mg daily and stop ASA and Plavix.  - If he is in atrial fibrillation at 10 day followup, will arrange for TEE-guided DCCV versus wait 3 more weeks for DCCV w/o TEE (depending on how he is doing).   Followup in 10 days with NP/PA.    Signed, [name], MD  01/06/2020  [facility] [address] [facility] [place] [address] [phone] (office) [phone] (fax)

## 2020-01-06 NOTE — Telephone Encounter (Signed)
Called patient, wife [name] picked up. Advised of EKG results and to stop Aspirin and Plavix. Pt will also increase dose of Xarelto to full dose 20mg  once daily. Advised to stop current dose of Xarelto 2.5.  Verbalized understanding.

## 2020-01-06 NOTE — Patient Instructions (Signed)
STOP Plavix (Clopidigrel)  INCREASE Torsemide to 80mg  (4 tabs) in the morning AND 60mg  (3 tabs) in the evening  You were given a prescription for Compression Stockings. You can take this to [facility] supply on [facility] today We will only contact you if something comes back abnormal or we need to make some changes. Otherwise no news is good news!  Please call EP office for an appointment with Dr Lovena Le (low BiV- pacing percentage)   Your physician recommends that you schedule a follow-up appointment in: 10 days with Nurse Practitioner/Physicians Assistant.   Please call office at [phone] option 2 if you have any questions or concerns.   At the [facility], you and your health needs are our priority. As part of our continuing mission to provide you with exceptional heart care, we have created designated Provider Care Teams. These Care Teams include your primary Cardiologist (physician) and Advanced Practice Providers (APPs- Physician Assistants and Nurse Practitioners) who all work together to provide you with the care you need, when you need it.   You may see any of the following providers on your designated Care Team at your next follow up: Marland Kitchen Dr [name] . Dr [name] . Darrick Grinder, NP . Lyda Jester, PA . Audry Riles, PharmD   Please be sure to bring in all your medications bottles to every appointment.

## 2020-01-06 NOTE — Telephone Encounter (Signed)
Patient is scheduled for f/u with Dr. Lovena Le on 01/15/20.

## 2020-01-06 NOTE — Telephone Encounter (Signed)
Alert received for low CRT pacing.  Pt seen in CHF clinic today, advised by [name] to make appt with Dr. Lovena Le.  Message sent to scheduling to contact pt.

## 2020-01-07 ENCOUNTER — Telehealth (HOSPITAL_COMMUNITY): Payer: Self-pay

## 2020-01-07 DIAGNOSIS — I5042 Chronic combined systolic (congestive) and diastolic (congestive) heart failure: Secondary | ICD-10-CM

## 2020-01-07 NOTE — Telephone Encounter (Signed)
-----   Message from Larey Dresser, MD sent at 01/06/2020  4:26 PM EST ----- Would get repeat BMET in 1 week as creatinine is higher.  Still needs fluid off.

## 2020-01-07 NOTE — Telephone Encounter (Signed)
Pts wife aware of lab results and need for repeat bmet next week. Verbalized understanding

## 2020-01-08 ENCOUNTER — Other Ambulatory Visit: Payer: Self-pay | Admitting: Cardiology

## 2020-01-13 ENCOUNTER — Other Ambulatory Visit: Payer: Self-pay | Admitting: Cardiology

## 2020-01-14 ENCOUNTER — Ambulatory Visit (HOSPITAL_COMMUNITY)
Admission: RE | Admit: 2020-01-14 | Discharge: 2020-01-14 | Disposition: A | Discharge status: Home / Self Care | Payer: Medicare Other | Source: Ambulatory Visit | Attending: Internal Medicine | Admitting: Internal Medicine

## 2020-01-14 ENCOUNTER — Other Ambulatory Visit: Payer: Self-pay

## 2020-01-14 DIAGNOSIS — I5042 Chronic combined systolic (congestive) and diastolic (congestive) heart failure: Secondary | ICD-10-CM | POA: Diagnosis not present

## 2020-01-14 LAB — BASIC METABOLIC PANEL
Anion gap: 11 (ref 5–15)
BUN: 63 mg/dL — ABNORMAL HIGH (ref 8–23)
CO2: 25 mmol/L (ref 22–32)
Calcium: 9.2 mg/dL (ref 8.9–10.3)
Chloride: 104 mmol/L (ref 98–111)
Creatinine, Ser: 1.93 mg/dL — ABNORMAL HIGH (ref 0.61–1.24)
GFR calc Af Amer: 38 mL/min — ABNORMAL LOW (ref >60)
GFR calc non Af Amer: 33 mL/min — ABNORMAL LOW (ref >60)
Glucose, Bld: 121 mg/dL — ABNORMAL HIGH (ref 70–99)
Potassium: 4.6 mmol/L (ref 3.5–5.1)
Sodium: 140 mmol/L (ref 135–145)

## 2020-01-15 ENCOUNTER — Encounter: Payer: Self-pay | Admitting: Internal Medicine

## 2020-01-15 ENCOUNTER — Telehealth (HOSPITAL_COMMUNITY): Payer: Self-pay

## 2020-01-15 ENCOUNTER — Ambulatory Visit (INDEPENDENT_AMBULATORY_CARE_PROVIDER_SITE_OTHER): Payer: Medicare Other | Admitting: Internal Medicine

## 2020-01-15 VITALS — BP 100/60 | HR 68 | Ht 68.0 in | Wt 292.6 lb

## 2020-01-15 DIAGNOSIS — I5042 Chronic combined systolic (congestive) and diastolic (congestive) heart failure: Secondary | ICD-10-CM

## 2020-01-15 DIAGNOSIS — Z95810 Presence of automatic (implantable) cardiac defibrillator: Secondary | ICD-10-CM

## 2020-01-15 MED ORDER — TORSEMIDE 20 MG PO TABS: ORAL_TABLET | ORAL | 5 refills | Status: DC

## 2020-01-15 NOTE — Progress Notes (Signed)
HPI [name] returns today having been referred back by [name] for ICD adjustment. He is a pleasant morbidly obese 74 yo man with chronic systolic heart failure, HTN, and PVC's. He was noted on his device interrogation to have 12% PVC's and a reduced effective biv pacing. He denies chest pain. He is fairly sedentary.  Allergies  Allergen Reactions  . Tussionex Pennkinetic Er [Hydrocod Polst-Cpm Polst Er] Other (See Comments)    UNSPECIFIED REACTION  > "caused prostate problems"  . Ace Inhibitors Cough     Current Outpatient Medications  Medication Sig Dispense Refill  . albuterol (PROVENTIL HFA;VENTOLIN HFA) 108 (90 BASE) MCG/ACT inhaler Inhale 2 puffs into the lungs every 6 (six) hours as needed for wheezing or shortness of breath. 3 Inhaler 4  . albuterol (PROVENTIL) (2.5 MG/3ML) 0.083% nebulizer solution USE 1 VIAL IN NEBULIZER 4 TIMES DAILY 75 mL 11  . amiodarone (PACERONE) 200 MG tablet TAKE 1 TABLET BY MOUTH EVERY DAY 90 tablet 1  . arformoterol (BROVANA) 15 MCG/2ML NEBU Take 2 mLs (15 mcg total) by nebulization 2 (two) times daily. 120 mL 5  . atorvastatin (LIPITOR) 80 MG tablet TAKE 1 TABLET BY MOUTH EVERY DAY 90 tablet 2  . ATROVENT HFA 17 MCG/ACT inhaler TAKE 2 PUFFS BY MOUTH EVERY 4 HOURS AS NEEDED FOR WHEEZE  12  . budesonide (PULMICORT) 0.5 MG/2ML nebulizer solution Take 2 mLs (0.5 mg total) by nebulization 2 (two) times daily. 120 mL 5  . carvedilol (COREG) 25 MG tablet Take 1 tablet (25 mg total) by mouth 2 (two) times daily with a meal. 180 tablet 1  . diazepam (VALIUM) 5 MG tablet 1-2 tabs every 8 hours if needed for tremor 30 tablet 5  . ENTRESTO 49-51 MG TAKE 1 TABLET BY MOUTH TWICE A DAY 60 tablet 6  . finasteride (PROSCAR) 5 MG tablet Take 5 mg by mouth daily.    . fluticasone (FLONASE) 50 MCG/ACT nasal spray Instill 1 spray in each  nostril daily 32 g 3  . Fluticasone-Umeclidin-Vilant (TRELEGY ELLIPTA) 100-62.5-25 MCG/INH AEPB Inhale 1 puff into the lungs  daily. Rinse mouth 60 each 12  . Glycopyrrolate (LONHALA MAGNAIR REFILL KIT) 25 MCG/ML SOLN Inhale 1 Act into the lungs 2 (two) times daily. 60 mL 11  . isosorbide mononitrate (IMDUR) 60 MG 24 hr tablet TAKE 1 TABLET BY MOUTH  DAILY 90 tablet 3  . KLOR-CON M20 20 MEQ tablet TAKE 1 TABLET BY MOUTH EVERY DAY 90 tablet 2  . levothyroxine (SYNTHROID) 50 MCG tablet TAKE 1 TABLET BY MOUTH  DAILY BEFORE BREAKFAST 90 tablet 0  . nitroGLYCERIN (NITROSTAT) 0.4 MG SL tablet DISSOLVE 1 TABLET UNDER THE TONGUE EVERY 5 MINUTES AS  NEEDED FOR CHEST PAIN , MAX 3 TABLETS IN 15 MIN, CALL  911 IF CHEST PAIN PERSISTS 25 tablet 0  . NON FORMULARY at bedtime. CPAP And during the day as needed    . predniSONE (DELTASONE) 10 MG tablet 1 tablet daily 30 tablet 0  . rivaroxaban (XARELTO) 20 MG TABS tablet Take 1 tablet (20 mg total) by mouth daily with supper. 30 tablet 5  . spironolactone (ALDACTONE) 25 MG tablet TAKE 1 TABLET BY MOUTH  DAILY 90 tablet 3  . torsemide (DEMADEX) 20 MG tablet Take 4 tablets (80 mg total) by mouth every morning AND 3 tablets (60 mg total) every evening. 210 tablet 5  . VASCEPA 1 g capsule TAKE 2 CAPSULES (2 G TOTAL) BY MOUTH  2 (TWO) TIMES A DAY. 120 capsule 1  . ezetimibe (ZETIA) 10 MG tablet Take 1 tablet (10 mg total) by mouth daily. 90 tablet 3   No current facility-administered medications for this visit.     Past Medical History:  Diagnosis Date  . AAA (abdominal aortic aneurysm) ([place])    followed by Dr. Bridgett Larsson  . Adenomatous colon polyp 01/2004  . Anemia    hx  . Automatic implantable cardioverter-defibrillator in situ   . AVM (arteriovenous malformation)   . BPH (benign prostatic hypertrophy)   . CAD (coronary artery disease)    s/p CABGx2 in 1996  . Carotid artery stenosis    LCEA - Dr. Bridgett Larsson in 2013  . CHF (congestive heart failure) ([place])   . Complication of anesthesia    claustrophobic, unabe to lie on back more than 4 hours at time due to back  . COPD (chronic  obstructive pulmonary disease) ([place])   . Diabetes mellitus    DIET CONTROLLED- pt states that this was a misdiagnosis, he was treated while in the hospital  but returned home, loss a massive amount of weight and he has not had a problem with his blood sugar since. States everything has been normal for about 3 years.  . Diverticulosis   . Dyspnea   . Dysrhythmia    ICD-defibrillator  . Fatigue   . GERD (gastroesophageal reflux disease)   . H/O hiatal hernia   . History of radiation therapy 04/09/17-04/17/17   SBRT right lung 54 Gy in 3 fractions  . HLD (hyperlipidemia)   . Hypertension   . Hypothyroidism   . Ischemic cardiomyopathy   . Myocardial infarction ([place])   . 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 OSA on CPAP    AHI durign total sleep 14.69/hr, during REM 50.91/hr  . Peripheral vascular disease ([facility])    LCEA, L renal artery stent, bilat iliac stents, R SFA stenosis  . Tremor   . Ventricular tachycardia ([place]) 09/09/2014   Amiodarone was started after appropriate defibrillator shocks for ventricular tachycardia in October 2008    ROS:   All systems reviewed and negative except as noted in the HPI.   Past Surgical History:  Procedure Laterality Date  . ABDOMINAL AORTIC ENDOVASCULAR STENT GRAFT [name] 01/06/2014   Procedure: ABDOMINAL AORTIC ENDOVASCULAR STENT GRAFT- GORE; ULTRASOUND GUIDED;  Surgeon: Conrad [place], MD;  Location: [facility];  Service: Vascular;  Laterality: [name];  . ANGIOPLASTY     BILATERAL  LE  W/STENTS  . BI-VENTRICULAR IMPLANTABLE CARDIOVERTER DEFIBRILLATOR UPGRADE [name] 10/09/2014   Procedure: BI-VENTRICULAR IMPLANTABLE CARDIOVERTER DEFIBRILLATOR UPGRADE;  Surgeon: Evans Lance, MD;  Location: [facility] CATH LAB;  Service: Cardiovascular;  Laterality: [name];  . BIV ICD GENERTAOR CHANGE OUT  10/09/2014   UPGRADE TO BIV        BY DR Lovena Le  . CARDIAC CATHETERIZATION  09/16/2007   occlusion of both vein grafts, no significant LAD disease or diagonal disease, Cfx collaterals from  the left, 70% in-stent restenosis of L renal artery, normal L main, RCA occluded ostially (Dr. Adora Fridge)  . CARDIAC CATHETERIZATION  10/03/2002   SVG sequentially to OM1 & OM2 totally occluded at ostium, SVG to PDA totally occluded within previously placed prox vein graft stent, smal distal AAA, bialt iliac stents with 30% left end-stent restenosis and 50% right end-stent restnosis(Dr. Gerrie Nordmann)  . CARDIAC CATHETERIZATION  06/11/1998   L main with 20% narrowing in distal 1/3; LAD with  1st diagonla having 70% ostial narrowing, 2nd diagonal with 40% narrowing in prox third, LIMA & RIMA widely patent; in-stent restenosis of RCIA with successful PTA and new prox SVTRCA stent for residual disease (Dr. Marella Chimes)  . CARDIAC DEFIBRILLATOR PLACEMENT  06/2005   Guidant Vitality HE - ischemic cardiomyopathy - Dr. Marella Chimes  . CAROTID ENDARTERECTOMY Left 01/04/12  . CORONARY ANGIOPLASTY  01/08/2004   cutting balloon atherectomy & percutaneous intervention of RCIA in-stent restenosis (Dr. Marella Chimes)  . CORONARY ARTERY BYPASS GRAFT  11/04/1985   x2 - PDA and sequential DX-OM (Dr. Redmond Pulling)  . ENDARTERECTOMY  01/04/2012   Procedure: ENDARTERECTOMY CAROTID;left  Surgeon: Hinda Lenis, MD;  Location: [facility];  Service: Vascular;  Laterality: Left;  with patch angioplasty  . FUDUCIAL PLACEMENT [name] 02/23/2017   Procedure: PLACEMENT OF FUDUCIAL;  Surgeon: [name], MD;  Location: [facility];  Service: Thoracic;  Laterality: [name];  . ICD GENERATOR CHANGE  05/02/2010   [employer], retail  . ILIAC ARTERY STENT Bilateral 03/1997   and L SFA PTA  . Iron infusion  June 16, 2012  . LEFT HEART CATHETERIZATION WITH CORONARY ANGIOGRAM [name] 07/06/2014   Procedure: LEFT HEART CATHETERIZATION WITH CORONARY ANGIOGRAM;  Surgeon: Peter M Martinique, MD;  Location: [facility] CATH LAB;  Service: Cardiovascular;  Laterality: [name];  . NM MYOCAR PERF WALL MOTION  09/2012   lexiscan myoview - mod-severe perfusion defect r/t  infarct or scar w/mild periinfarct ishcemia in basal inferior, mid inferior, apical inferior, basal inferolateral & mid inferoalteral regions - EF 21% low risk scan  . POLYPECTOMY    . RENAL ARTERY STENT Left 03/24/2004   6x32m Genesis stent (Dr. RMarella Chimes  . RIGHT/LEFT HEART CATH AND CORONARY ANGIOGRAPHY [name] 12/28/2017   Procedure: RIGHT/LEFT HEART CATH AND CORONARY ANGIOGRAPHY;  Surgeon: MLarey Dresser MD;  [facility];  Service: Cardiovascular;  Laterality: [name];  . TRANSTHORACIC ECHOCARDIOGRAM  12/2012   EF 30-35; LV mod to severely dilated, mod concentric hypertrophy, severe hypokinesis of inferolateral myocardium, moderate hypokineis of anteroseptal region, grade 1 diastolic dysfunction; mod MR; LA mod-severely dialted; RV mod dialted; RA mildly dilated; PA peak pressure 359mg  . VIDEO BRONCHOSCOPY WITH ENDOBRONCHIAL NAVIGATION [name] 02/23/2017   Procedure: VIDEO BRONCHOSCOPY WITH ENDOBRONCHIAL NAVIGATION;  Surgeon: StMelrose NakayamaMD;  [facility];  Service: Thoracic;  Laterality: [name];     Family History  Problem Relation Age of Onset  . Lung cancer Mother   . Cancer Mother   . Diabetes Mother   . Hypertension Mother   . Hyperlipidemia Mother   . Heart disease Mother        before age 75. Heart attack Father   . Heart disease Father        before age 75. Hypertension Father   . Colon cancer Sister   . Cancer Sister   . Hypertension Sister   . Hyperlipidemia Sister   . Parkinson's disease Sister   . Diabetes Sister   . Heart disease Sister        before age 75. Heart attack Maternal Grandmother   . Diabetes Daughter      Social History   Socioeconomic History  . Marital status: Married    Spouse name: Not on file  . Number of children: 2  . Years of education: Not on file  . Highest education level: Not on file  Occupational History  . Occupation: retired poNeurosurgeon  officer & jeweler   Tobacco Use  . Smoking status: Former Smoker      Packs/day: 1.50    Years: 40.00    Pack years: 60.00    Types: Cigarettes, Pipe    Quit date: 12/11/2002    Years since quitting: 17.1  . Smokeless tobacco: Never Used  . Tobacco comment: Pt reports smoking 1 pack/day before quitting in 2004  Substance and Sexual Activity  . Alcohol use: No    Alcohol/week: 0.0 standard drinks  . Drug use: No  . Sexual activity: Not Currently  Other Topics Concern  . Not on file  Social History Narrative  . Not on file   Social Determinants of Health   Financial Resource Strain:   . Difficulty of Paying Living Expenses: Not on file  Food Insecurity:   . Worried About Charity fundraiser in the Last Year: Not on file  . Ran Out of Food in the Last Year: Not on file  Transportation Needs:   . Lack of Transportation (Medical): Not on file  . Lack of Transportation (Non-Medical): Not on file  Physical Activity:   . Days of Exercise per Week: Not on file  . Minutes of Exercise per Session: Not on file  Stress:   . Feeling of Stress : Not on file  Social Connections:   . Frequency of Communication with Friends and Family: Not on file  . Frequency of Social Gatherings with Friends and Family: Not on file  . Attends Religious Services: Not on file  . Active Member of Clubs or Organizations: Not on file  . Attends Archivist Meetings: Not on file  . Marital Status: Not on file  Intimate Partner Violence:   . Fear of Current or Ex-Partner: Not on file  . Emotionally Abused: Not on file  . Physically Abused: Not on file  . Sexually Abused: Not on file     BP 100/60   Pulse 68   Ht 5' 8"  (1.727 m)   Wt 292 lb 9.6 oz (132.7 kg)   SpO2 99%   BMI 44.49 kg/m   Physical Exam:  Morbidly obese appearing 74 yo man, NAD HEENT: Unremarkable Neck:  6 cm JVD, no thyromegally Lymphatics:  No adenopathy Back:  No CVA tenderness Lungs:  Clear with no wheezes HEART:  Regular rate rhythm, no murmurs, no rubs, no clicks Abd:  soft,  positive bowel sounds, no organomegally, no rebound, no guarding Ext:  2 plus pulses, no edema, no cyanosis, no clubbing Skin:  No rashes no nodules Neuro:  CN II through XII intact, motor grossly intact  DEVICE  Normal device function.  See PaceArt for details.   Assess/Plan: 1. PVC's - today we reprogrammed his ICD to a lower rate of 70 in hopes of increasing biv pacing. 2. Chronic systolic heart failure - with uptitration of his meds and support stockings, his symptoms have improved and his peripheral edema is much better.  3. Obesity - I strongly encouraged the patient to lose weight.  4. ICD - his [place] ICD is working normally. We will recheck in several months.  [name].D.

## 2020-01-15 NOTE — Patient Instructions (Addendum)
Medication Instructions:  Your physician recommends that you continue on your current medications as directed. Please refer to the Current Medication list given to you today.  Labwork: None ordered.  Testing/Procedures: None ordered.  Follow-Up: Your physician wants you to follow-up in: one year with Dr. Lovena Le.   You will receive a reminder letter in the mail two months in advance. If you don't receive a letter, please call our office to schedule the follow-up appointment.  Remote monitoring is used to monitor your ICD from home. This monitoring reduces the number of office visits required to check your device to one time per year. It allows [place] to keep an eye on the functioning of your device to ensure it is working properly. You are scheduled for a device check from home on 02/20/2020. You may send your transmission at any time that day. If you have a wireless device, the transmission will be sent automatically.   Any Other Special Instructions Will Be Listed Below (If Applicable).  If you need a refill on your cardiac medications before your next appointment, please call your pharmacy.

## 2020-01-15 NOTE — Telephone Encounter (Signed)
Spoke with patients wife, aware of lab results and recommendations. Will repeat blood work at time of visit on 2/9.  Verbalized understanding.

## 2020-01-15 NOTE — Telephone Encounter (Signed)
-----   Message from Larey Dresser, MD sent at 01/14/2020  4:18 PM EST ----- Hold torsemide for a day, decrease torsemide to 60 qam/40 qpm after that and get BMET 1 week.  Needs followup with me or APP.

## 2020-01-19 ENCOUNTER — Encounter: Payer: Self-pay | Admitting: Internal Medicine

## 2020-01-19 ENCOUNTER — Telehealth: Payer: Self-pay | Admitting: Emergency Medicine

## 2020-01-19 NOTE — Assessment & Plan Note (Signed)
Did benefit with good compliance and control  Plan- replace broken machine auto 10-15

## 2020-01-19 NOTE — Assessment & Plan Note (Signed)
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

## 2020-01-19 NOTE — Assessment & Plan Note (Signed)
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 Never biopsied.

## 2020-01-19 NOTE — Telephone Encounter (Signed)
LMOM to call office.  Alert received for EGM that show AT for 01/19/20. Need to assess for symptoms and if currently taking amiodorone as directed at 200 mg daily.

## 2020-01-20 ENCOUNTER — Other Ambulatory Visit: Payer: Self-pay

## 2020-01-20 ENCOUNTER — Ambulatory Visit (HOSPITAL_COMMUNITY)
Admission: RE | Admit: 2020-01-20 | Discharge: 2020-01-20 | Disposition: A | Discharge status: Home / Self Care | Payer: Medicare Other | Source: Ambulatory Visit | Attending: Adult Health | Admitting: Adult Health

## 2020-01-20 ENCOUNTER — Encounter (HOSPITAL_COMMUNITY): Payer: Self-pay

## 2020-01-20 VITALS — BP 118/84 | HR 74 | Wt 291.0 lb

## 2020-01-20 DIAGNOSIS — I110 Hypertensive heart disease with heart failure: Secondary | ICD-10-CM | POA: Diagnosis not present

## 2020-01-20 DIAGNOSIS — Z79899 Other long term (current) drug therapy: Secondary | ICD-10-CM | POA: Insufficient documentation

## 2020-01-20 DIAGNOSIS — N400 Enlarged prostate without lower urinary tract symptoms: Secondary | ICD-10-CM | POA: Insufficient documentation

## 2020-01-20 DIAGNOSIS — E785 Hyperlipidemia, unspecified: Secondary | ICD-10-CM | POA: Insufficient documentation

## 2020-01-20 DIAGNOSIS — I255 Ischemic cardiomyopathy: Secondary | ICD-10-CM | POA: Insufficient documentation

## 2020-01-20 DIAGNOSIS — G4733 Obstructive sleep apnea (adult) (pediatric): Secondary | ICD-10-CM | POA: Diagnosis not present

## 2020-01-20 DIAGNOSIS — K219 Gastro-esophageal reflux disease without esophagitis: Secondary | ICD-10-CM | POA: Insufficient documentation

## 2020-01-20 DIAGNOSIS — Z6841 Body Mass Index (BMI) 40.0 and over, adult: Secondary | ICD-10-CM | POA: Diagnosis not present

## 2020-01-20 DIAGNOSIS — I480 Paroxysmal atrial fibrillation: Secondary | ICD-10-CM | POA: Insufficient documentation

## 2020-01-20 DIAGNOSIS — Z87891 Personal history of nicotine dependence: Secondary | ICD-10-CM | POA: Diagnosis not present

## 2020-01-20 DIAGNOSIS — I252 Old myocardial infarction: Secondary | ICD-10-CM | POA: Insufficient documentation

## 2020-01-20 DIAGNOSIS — Z7901 Long term (current) use of anticoagulants: Secondary | ICD-10-CM | POA: Diagnosis not present

## 2020-01-20 DIAGNOSIS — R251 Tremor, unspecified: Secondary | ICD-10-CM | POA: Insufficient documentation

## 2020-01-20 DIAGNOSIS — E039 Hypothyroidism, unspecified: Secondary | ICD-10-CM | POA: Insufficient documentation

## 2020-01-20 DIAGNOSIS — Z923 Personal history of irradiation: Secondary | ICD-10-CM | POA: Insufficient documentation

## 2020-01-20 DIAGNOSIS — I2510 Atherosclerotic heart disease of native coronary artery without angina pectoris: Secondary | ICD-10-CM | POA: Insufficient documentation

## 2020-01-20 DIAGNOSIS — I5022 Chronic systolic (congestive) heart failure: Secondary | ICD-10-CM | POA: Insufficient documentation

## 2020-01-20 DIAGNOSIS — Z8349 Family history of other endocrine, nutritional and metabolic diseases: Secondary | ICD-10-CM | POA: Insufficient documentation

## 2020-01-20 DIAGNOSIS — I5042 Chronic combined systolic (congestive) and diastolic (congestive) heart failure: Secondary | ICD-10-CM | POA: Diagnosis not present

## 2020-01-20 DIAGNOSIS — J449 Chronic obstructive pulmonary disease, unspecified: Secondary | ICD-10-CM | POA: Diagnosis not present

## 2020-01-20 DIAGNOSIS — Z7951 Long term (current) use of inhaled steroids: Secondary | ICD-10-CM | POA: Insufficient documentation

## 2020-01-20 DIAGNOSIS — Z801 Family history of malignant neoplasm of trachea, bronchus and lung: Secondary | ICD-10-CM | POA: Diagnosis not present

## 2020-01-20 DIAGNOSIS — Z79890 Hormone replacement therapy (postmenopausal): Secondary | ICD-10-CM | POA: Insufficient documentation

## 2020-01-20 DIAGNOSIS — E1151 Type 2 diabetes mellitus with diabetic peripheral angiopathy without gangrene: Secondary | ICD-10-CM | POA: Insufficient documentation

## 2020-01-20 DIAGNOSIS — Z8249 Family history of ischemic heart disease and other diseases of the circulatory system: Secondary | ICD-10-CM | POA: Diagnosis not present

## 2020-01-20 DIAGNOSIS — I493 Ventricular premature depolarization: Secondary | ICD-10-CM

## 2020-01-20 DIAGNOSIS — Z951 Presence of aortocoronary bypass graft: Secondary | ICD-10-CM | POA: Diagnosis not present

## 2020-01-20 DIAGNOSIS — Z95810 Presence of automatic (implantable) cardiac defibrillator: Secondary | ICD-10-CM | POA: Diagnosis not present

## 2020-01-20 DIAGNOSIS — Z7952 Long term (current) use of systemic steroids: Secondary | ICD-10-CM | POA: Insufficient documentation

## 2020-01-20 LAB — BASIC METABOLIC PANEL
Anion gap: 10 (ref 5–15)
BUN: 74 mg/dL — ABNORMAL HIGH (ref 8–23)
CO2: 23 mmol/L (ref 22–32)
Calcium: 9 mg/dL (ref 8.9–10.3)
Chloride: 105 mmol/L (ref 98–111)
Creatinine, Ser: 1.89 mg/dL — ABNORMAL HIGH (ref 0.61–1.24)
GFR calc Af Amer: 39 mL/min — ABNORMAL LOW (ref >60)
GFR calc non Af Amer: 34 mL/min — ABNORMAL LOW (ref >60)
Glucose, Bld: 133 mg/dL — ABNORMAL HIGH (ref 70–99)
Potassium: 5 mmol/L (ref 3.5–5.1)
Sodium: 138 mmol/L (ref 135–145)

## 2020-01-20 NOTE — Progress Notes (Signed)
Date:  01/20/2020   ID:  [name], DOB 07-27-1946, MRN [id number]    Provider location:  Advanced Heart Failure Type of Visit: Established patient  PCP:  [name], MD  Cardiologist:  [name]  Chief Complaint: Shortness of breath   History of Present Illness: [name] is a 74 y.o. male who has a past medical history significant for morbid obesity, systolic heart failure with an EF of 30-35% (January 2014) -> 20-25% (March 2015 - RV normal), VT on amio, CAD s/p CABG 1996, LBBB, COPD, obstructive sleep apnea on CPAP, AAA repair (January 2015).   Admitted to [facility] July 24 through July 06 2014 with chest pain.  CEs negative. Had a LHC with no change from previous LHC with recommendations to continue medical management. Discharge weight was 255 pounds.   LHC 07/06/14 --No significant change from previous studies.  Left anterior descending (LAD): The LAD is a large vessel. There is a 40% stenosis immediately after the takeoff of a large septal perforator. There are 2 large diagonal branches without significant disease. Left circumflex (LCx): The LCx is occluded proximally. There are left to left collaterals.  Right coronary artery (RCA): The RCA is occluded proximally immediately following the conus branch. There are right to right and left to right collaterals. SVGs from CABG known to be totally occluded.   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   CTA chest/abd/pelvis (3/16) with moderate emphysema, 1.3 cm nodule RUL, left SFA totally occluded, right SFA with significant stenosis, s/p endovascular AAA repair (stable).   Echo (5/17) with EF 20-25%, severe LV dilation, mild MR, normal RV size with mildly decreased systolic function.   He has a RUL nodule that is most likely lung cancer, he is being treated with radiation.   Echo (8/18) with EF 30%, basal-mid inferior and inferolateral AK, severe LV dilation, normal RV, mild MR.   RHC/LHC was done  in 1/19 due to worsening exertional dyspnea.  This showed normal filling pressures and preserved cardiac output.  There was 30-40% mLAD stenosis.  The LAD provided collaterals to the LCx and RCA territories.  The LCx, RCA, SVG-OM, and SVG-PDA were all chronically occluded (known from the past).  No new disease.   [facility] with 0.78 on right, 0.59 on left => left iliac > 50% stenosis.  11/19 ABIs 0.78 right, 0.59 left.  3/20 ABIs 0.74 right, 0.66 left.   He saw neurology about his tremor.  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  However, he is on amiodarone which may be contributing.  He has been seeing Dr. Vertell Limber with plan for deep brain stimulator, on hold currently due to cancer treatment.   He had chest radiation for RLL lung cancer.  However, CT showed new RUL lesion in 11/19 and he restart XRT in 12/19, repeat XRT is now completed.   Last CT chest showed a lesion in the left upper lobe.  He had XRT again, finishing in 9/20.   Echo in 7/20 showed EF 35-40%, basal to mid inferolateral and inferior akinesis, mildly decreased RV systolic function, mild AS, PASP 22 mmHg.    He was seen by Dr Aundra Dubin on 1/27 and was volume overloaded so torsemide was increased to 80 qam/60 This was thought to be in part due to A fib. Last week he was seen by Dr Lovena Le and had ICD reprogrammed.   Today he returns for HF follow up with  his wife. Overall feeling much better. Says he has had fewer palpitations. Mild SOB with exertion. Remains on oxygen. No chest pain.  Denies PND/Orthopnea. Appetite ok. No fever or chills. He has not been weighing at home. Taking all medications.   [employer] device interrogation: 82 % BiV pacing. No A fib on interrogation   ECG (personally reviewed):  AV dual paced 75 bpm with occasional PVCs  Labs 3/15 Cr 1.5 K 5.6 Labs 07/06/14 K 3.9 Creatinine  0.98 Labs 9/15 K 5.1, creatinine 0.96 Labs 11/15 K 3.8, creatinine 0.83, LDL 61, LFTs normal, TSH normal Labs  11/26/14 K 5.0 Creatinine 0.73 , LFTs normal, TSH normal,  Labs 12/08/14 K 3.4 Creatinine 0.83  Labs 3/16 K 4.4, creatinine 1.03, LFTs normal, TSH normal, HCT 37.8, BNP 65 Labs 6/16 K 4.1, creatinine 0.93, TSH normal, LFTs normal Labs 11/16 K 5.1, creatinine 1.13, LFTs normal, TSH normal, LDL 83, HDL 33, TGs 162 Labs 1/17 K 4.4, creatinine 0.86, pro-BNP 154 Labs 2/17 BNP 113, K 3.8, creatinine 0.79, TSH normal, LFTs normal Labs 4/17 K 3.8, creatinine 0.91, HCT 48.1 Labs 6/17 K 3.5, creatinine 0.98 Labs 9/17 K 3.9, creatinine 0.79, LDL 69, HDL 29, TSH normal, LFTs normal Labs 3/18 K 3.6, creatinine 0.87 Labs 8/18 K 3.3 => 4.2, creatinine 0.78 => 1.04, LDL 66, HDL 38, BNP 166 Labs 9/18: K 4.7, creatinine 0.9, BNP 144 Labs 11/18: K 4.8, creatinine 1.05, TSH normal, LFTs normal.  Labs 1/19: K 4.4, creatinine 1.08, LFTs normal Labs 5/19: LDL 60, HDL 28, TSH normal, LFTs normal, K 4.6, creatinine 1.43 Labs 11/19: K 4.6, creatinine 1.42, LFTs normal, TSH normal, Mg 2.3 Labs 1/20: TSH normal, K 4.2, creatinine 1.12, LFTs normal Labs 4/20: LDL 42, HDL 28, TGs 234 Labs 5/20: K 4.8 => 4.4, creatinine 1.95 => 1.38, TSH normal, LFTs normal Labs 7/20: K 4.1, creatinine 1.22 Labs 9/20: LDL 36, HDL 29  Past Medical History:  Diagnosis Date  . AAA (abdominal aortic aneurysm) ([place])    followed by Dr. Bridgett Larsson  . Adenomatous colon polyp 01/2004  . Anemia    hx  . Automatic implantable cardioverter-defibrillator in situ   . AVM (arteriovenous malformation)   . BPH (benign prostatic hypertrophy)   . CAD (coronary artery disease)    s/p CABGx2 in 1996  . Carotid artery stenosis    LCEA - Dr. Bridgett Larsson in 2013  . CHF (congestive heart failure) ([place])   . Complication of anesthesia    claustrophobic, unabe to lie on back more than 4 hours at time due to back  . COPD (chronic obstructive pulmonary disease) ([place])   . Diabetes mellitus    DIET CONTROLLED- pt states that this was a misdiagnosis, he was treated  while in the hospital  but returned home, loss a massive amount of weight and he has not had a problem with his blood sugar since. States everything has been normal for about 3 years.  . Diverticulosis   . Dyspnea   . Dysrhythmia    ICD-defibrillator  . Fatigue   . GERD (gastroesophageal reflux disease)   . H/O hiatal hernia   . History of radiation therapy 04/09/17-04/17/17   SBRT right lung 54 Gy in 3 fractions  . HLD (hyperlipidemia)   . Hypertension   . Hypothyroidism   . Ischemic cardiomyopathy   . Myocardial infarction ([place])   . 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 OSA on CPAP    AHI durign total  sleep 14.69/hr, during REM 50.91/hr  . Peripheral vascular disease (HCC)    LCEA, L renal artery stent, bilat iliac stents, R SFA stenosis  . Tremor   . Ventricular tachycardia ([place]) 09/09/2014   Amiodarone was started after appropriate defibrillator shocks for ventricular tachycardia in October 2008   Past Surgical History:  Procedure Laterality Date  . ABDOMINAL AORTIC ENDOVASCULAR STENT GRAFT [name] 01/06/2014   Procedure: ABDOMINAL AORTIC ENDOVASCULAR STENT GRAFT- GORE; ULTRASOUND GUIDED;  Surgeon: Conrad [place], MD;  Location: [place];  Service: Vascular;  Laterality: [name];  . ANGIOPLASTY     BILATERAL  LE  W/STENTS  . BI-VENTRICULAR IMPLANTABLE CARDIOVERTER DEFIBRILLATOR UPGRADE [name] 10/09/2014   Procedure: BI-VENTRICULAR IMPLANTABLE CARDIOVERTER DEFIBRILLATOR UPGRADE;  Surgeon: Evans Lance, MD;  Location: [facility] C[facility] LAB;  Service: Cardiovascular;  Laterality: [name];  . BIV ICD GENERTAOR CHANGE OUT  10/09/2014   UPGRADE TO BIV        BY DR Lovena Le  . 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 Adora Fridge)  . CARDIAC CATHETERIZATION  10/03/2002   SVG sequentially to OM1 & OM2 totally occluded at ostium, SVG to  PDA totally occluded within previously placed prox vein graft stent, smal distal AAA, bialt iliac stents with 30% left end-stent restenosis and 50% right end-stent restnosis(Dr. Gerrie Nordmann)  . CARDIAC CATHETERIZATION  06/11/1998   L main with 20% narrowing in distal 1/3; LAD with 1st diagonla having 70% ostial narrowing, 2nd diagonal with 40% narrowing in prox third, LIMA & RIMA widely patent; in-stent restenosis of RCIA with successful PTA and new prox SVTRCA stent for residual disease (Dr. Marella Chimes)  . CARDIAC DEFIBRILLATOR PLACEMENT  06/2005   Guidant Vitality HE - ischemic cardiomyopathy - Dr. Marella Chimes  . CAROTID ENDARTERECTOMY Left 01/04/12  . CORONARY ANGIOPLASTY  01/08/2004   cutting balloon atherectomy & percutaneous intervention of RCIA in-stent restenosis (Dr. Marella Chimes)  . CORONARY ARTERY BYPASS GRAFT  11/04/1985   x2 - PDA and sequential DX-OM (Dr. Redmond Pulling)  . ENDARTERECTOMY  01/04/2012   Procedure: ENDARTERECTOMY CAROTID;left  Surgeon: Hinda Lenis, MD;  Location: [place];  Service: Vascular;  Laterality: Left;  with patch angioplasty  . FUDUCIAL PLACEMENT [name] 02/23/2017   Procedure: PLACEMENT OF FUDUCIAL;  Surgeon: [name], MD;  Location: [place];  Service: Thoracic;  Laterality: [name];  . ICD GENERATOR CHANGE  05/02/2010   [employer], [employer]  . ILIAC ARTERY STENT Bilateral 03/1997   and L SFA PTA  . Iron infusion  June 16, 2012  . LEFT HEART CATHETERIZATION WITH CORONARY ANGIOGRAM [name] 07/06/2014   Procedure: LEFT HEART CATHETERIZATION WITH CORONARY ANGIOGRAM;  Surgeon: Peter M Martinique, MD;  Location[facility] C[facility] LAB;  Service: Cardiovascular;  Laterality: [name];  . NM MYOCAR PERF WALL MOTION  09/2012   lexiscan myoview - mod-severe perfusion defect r/t infarct or scar w/mild periinfarct ishcemia in basal inferior, mid inferior, apical inferior, basal inferolateral & mid inferoalteral regions - EF 21% low risk scan  . POLYPECTOMY    . RENAL ARTERY STENT Left  03/24/2004   [id number] Genesis stent (Dr. RMarella Chimes  . RIGHT/LEFT HEART CATH AND CORONARY ANGIOGRAPHY [name] 12/28/2017   Procedure: RIGHT/LEFT HEART CATH AND CORONARY ANGIOGRAPHY;  Surgeon: MLarey Dresser MD;  [facility];  Service: Cardiovascular;  Laterality: [name];  . TRANSTHORACIC ECHOCARDIOGRAM  12/2012   EF 30-35; LV mod to severely dilated, mod concentric hypertrophy, severe hypokinesis of inferolateral myocardium, moderate hypokineis of anteroseptal region, grade 1 diastolic dysfunction; mod MR; LA mod-severely dialted; RV mod dialted; RA mildly dilated; PA peak pressure 58mHg  . VIDEO BRONCHOSCOPY WITH ENDOBRONCHIAL NAVIGATION [name] 02/23/2017   Procedure: VIDEO BRONCHOSCOPY WITH ENDOBRONCHIAL NAVIGATION;  Surgeon: SMelrose Nakayama MD;  [place]  Service: Thoracic;  Laterality: [name];     Current Outpatient Medications  Medication Sig Dispense Refill  . albuterol (PROVENTIL HFA;VENTOLIN HFA) 108 (90 BASE) MCG/ACT inhaler Inhale 2 puffs into the lungs every 6 (six) hours as needed for wheezing or shortness of breath. 3 Inhaler 4  . albuterol (PROVENTIL) (2.5 MG/3ML) 0.083% nebulizer solution USE 1 VIAL IN NEBULIZER 4 TIMES DAILY 75 mL 11  . amiodarone (PACERONE) 200 MG tablet TAKE 1 TABLET BY MOUTH EVERY DAY 90 tablet 1  . arformoterol (BROVANA) 15 MCG/2ML NEBU Take 2 mLs (15 mcg total) by nebulization 2 (two) times daily. 120 mL 5  . atorvastatin (LIPITOR) 80 MG tablet TAKE 1 TABLET BY MOUTH EVERY DAY 90 tablet 2  . ATROVENT HFA 17 MCG/ACT inhaler TAKE 2 PUFFS BY MOUTH EVERY 4 HOURS AS NEEDED FOR WHEEZE  12  . budesonide (PULMICORT) 0.5 MG/2ML nebulizer solution Take 2 mLs (0.5 mg total) by nebulization 2 (two) times daily. 120 mL 5  . carvedilol (COREG) 25 MG tablet Take 1 tablet (25 mg total) by mouth 2 (two) times daily with a meal. 180 tablet 1  . diazepam (VALIUM) 5 MG tablet 1-2 tabs every 8 hours if needed for tremor 30 tablet 5  . ENTRESTO 49-51 MG TAKE 1  TABLET BY MOUTH TWICE A DAY 60 tablet 6  . ezetimibe (ZETIA) 10 MG tablet Take 1 tablet (10 mg total) by mouth daily. 90 tablet 3  . finasteride (PROSCAR) 5 MG tablet Take 5 mg by mouth daily.    . fluticasone (FLONASE) 50 MCG/ACT nasal spray Instill 1 spray in each  nostril daily 32 g 3  . Fluticasone-Umeclidin-Vilant (TRELEGY ELLIPTA) 100-62.5-25 MCG/INH AEPB Inhale 1 puff into the lungs daily. Rinse mouth 60 each 12  . Glycopyrrolate (LONHALA MAGNAIR REFILL KIT) 25 MCG/ML SOLN Inhale 1 Act into the lungs 2 (two) times daily. 60 mL 11  . isosorbide mononitrate (IMDUR) 60 MG 24 hr tablet TAKE 1 TABLET BY MOUTH  DAILY 90 tablet 3  . KLOR-CON M20 20 MEQ tablet TAKE 1 TABLET BY MOUTH EVERY DAY 90 tablet 2  . levothyroxine (SYNTHROID) 50 MCG tablet TAKE 1 TABLET BY MOUTH  DAILY BEFORE BREAKFAST 90 tablet 0  . nitroGLYCERIN (NITROSTAT) 0.4 MG SL tablet DISSOLVE 1 TABLET UNDER THE TONGUE EVERY 5 MINUTES AS  NEEDED FOR CHEST PAIN , MAX 3 TABLETS IN 15 MIN, CALL  911 IF CHEST PAIN PERSISTS 25 tablet 0  . NON FORMULARY at bedtime. CPAP And during the day as needed    . predniSONE (DELTASONE) 10 MG tablet 1 tablet daily 30 tablet 0  . rivaroxaban (XARELTO) 20 MG TABS tablet Take 1 tablet (20 mg total) by mouth daily with supper. 30 tablet 5  . spironolactone (ALDACTONE) 25 MG tablet TAKE 1 TABLET BY MOUTH  DAILY 90 tablet 3  . torsemide (DEMADEX) 20 MG tablet Take 3 tablets (60 mg total) by mouth every morning AND 2 tablets (40 mg total) every evening. 150 tablet 5  . VASCEPA 1 g capsule  TAKE 2 CAPSULES (2 G TOTAL) BY MOUTH 2 (TWO) TIMES A DAY. 120 capsule 1   No current facility-administered medications for this encounter.    Allergies:   Tussionex pennkinetic er [hydrocod polst-cpm polst er] and Ace inhibitors   Social History:  The patient  reports that he quit smoking about 17 years ago. His smoking use included cigarettes and pipe. He has a 60.00 pack-year smoking history. He has never used  smokeless tobacco. He reports that he does not drink alcohol or use drugs.   Family History:  The patient's family history includes Cancer in his mother and sister; Colon cancer in his sister; Diabetes in his daughter, mother, and sister; Heart attack in his father and maternal grandmother; Heart disease in his father, mother, and sister; Hyperlipidemia in his mother and sister; Hypertension in his father, mother, and sister; Lung cancer in his mother; Parkinson's disease in his sister.   ROS:  Please see the history of present illness.   All other systems are personally reviewed and negative.   Exam:   BP 118/84   Pulse 74   Wt 132 kg (291 lb)   SpO2 99% Comment: on 3L  BMI 44.25 kg/m   Wt Readings from Last 3 Encounters:  01/20/20 132 kg (291 lb)  01/15/20 132.7 kg (292 lb 9.6 oz)  01/06/20 (!) 136.4 kg (300 lb 9.6 oz)   General: Appears chronically ill. Arrived in a wheel chair. No resp difficulty HEENT: normal Neck: supple. no JVD. Carotids 2+ bilat; no bruits. No lymphadenopathy or thryomegaly appreciated. Cor: PMI nondisplaced. Regular rate & rhythm. No rubs, gallops or murmurs. Lungs: clear Abdomen: soft, nontender, nondistended. No hepatosplenomegaly. No bruits or masses. Good bowel sounds. Extremities: no cyanosis, clubbing, rash, edema Neuro: alert & orientedx3, cranial nerves grossly intact. moves all 4 extremities w/o difficulty. Affect pleasant  Recent Labs: 04/10/2019: Hemoglobin 13.7; Platelets 154 01/06/2020: ALT 18; Magnesium 2.4; TSH 6.916 01/14/2020: BUN 63; Creatinine, Ser 1.93; Potassium 4.6; Sodium 140  Personally reviewed   Wt Readings from Last 3 Encounters:  01/20/20 132 kg (291 lb)  01/15/20 132.7 kg (292 lb 9.6 oz)  01/06/20 (!) 136.4 kg (300 lb 9.6 oz)      ASSESSMENT AND PLAN:  1. Chronic systolic CHF: Ischemic cardiomyopathy. Echo in 8/18 showed EF 30% with severe LV dilation and normal RV.  [employer] CRT-D.  RHC in 1/19 showed normal  filling pressures and preserved cardiac output.  Last echo in 7/20 showed EF 35-40% with mildly decreased RV systolic function.  I suspect low percentage of BiV pacing (88%) is due to PVCs.    Atrial fibrillation likely is making CHF worse.  - NYHA III. Back to his baseline. Continue torsemide 60 mg /40 mg  - Continue Coreg 25 mg bid.   - Continue Entresto to 49/51 bid.     - Continue spironolactone 25 mg daily.  - EP reprogrammed ICD.  - Wear graded compression stockings.  2. CAD s/p CABG: No chest pain. [place] 1/19 with stable anatomy (LAD is patent, other vessels occluded with collaterals from the LAD). - No chest pain.  - Continue xarelto 20 mg daily.  - Continue atorvastatin.  3. VT s/p ICD: On amiodarone for history of ICD discharges due to VT.   - TSH elevated 1/26. Repeat next visit.  -  LFTs ok on 1/26. Knows he needs yearly eye exams.   4. Morbid obesity: Body mass index is 44.25 kg/m. Discussed portion control.  5. COPD: Moderate to severe.  Historically, a lot of his dyspnea has seemed to be due to COPD.   - Follow up with pulmonary. - Now has home oxygen. O2 sats stable.  6. Lung cancer: He completed radiation.  7. PAD: Occluded left SFA and significant right SFA stenosis on last CTA.  ABIs in 3/20 with significant disease bilaterally but stable.  Does not get leg pain with exertion but has nocturnal cramps.  [facility] by [facility], medical management for now.  He is on statin, good lipids in 9/20.  8. AAA: s/p repair.  Followed at [facility].  No endoleak on last evaluation.  9. Tremor: Most likely essential tremor given family history.  However, cannot rule out role for amiodarone. As above, he needs to continue at least a low dose of amiodarone.  Dr. Carles Collet with neurology discussed deep brain stimulation => this is on hold while he is being treated for lung cancer.   10. Hyperlipidemia: He is on Vascepa and atorvastatin.  - Good lipids in 9/20.  11. 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   - Repeat TSH next visit.  - Continue amiodarone.  - Continue Coreg 25 mg bid . 12.PAF: - Out of A fib today.  - Continue Coreg and amiodarone.  -Continue Xarelto to 20 mg daily   Check BMET Follow up in 2 months with Dr Aundra Dubin.     Jeanmarie Hubert, NP  01/20/2020  [place] [address] [facility] and [place] [address] [phone] (office) [phone] (fax)

## 2020-01-20 NOTE — Patient Instructions (Addendum)
Lab work done today. We will notify you of any abnormal lab work. No news is good news!  Please follow up with the [facility] in 8 weeks.  At the [facility], you and your health needs are our priority. As part of our continuing mission to provide you with exceptional heart care, we have created designated Provider Care Teams. These Care Teams include your primary Cardiologist (physician) and Advanced Practice Providers (APPs- Physician Assistants and Nurse Practitioners) who all work together to provide you with the care you need, when you need it.   You may see any of the following providers on your designated Care Team at your next follow up: Marland Kitchen Dr [name] . Dr [name] . Darrick Grinder, NP . Lyda Jester, PA . Audry Riles, PharmD   Please be sure to bring in all your medications bottles to every appointment.

## 2020-01-21 NOTE — Telephone Encounter (Signed)
Patient's wife reported he had appointment with Darrick Grinder NP yesterday and she recommended patient follow up with Dr Lovena Le. Did not see recommendation for EP follow up in Epic note from visit 01/20/20. Will verify patient needs appointment with Dr Lovena Le . Patient denies any symptoms at this time.

## 2020-01-22 NOTE — Telephone Encounter (Signed)
Discussed with Dr. Lovena Le. No reprogramming recommendations at this time as patient is back in AP/BiVP rhythm (as of 01/20/20). No additional alerts received since 01/19/20. Per Dr. Lovena Le, plan to reassess BiV pacing % on 02/20/20 transmission. Will make patient aware when he returns call.

## 2020-01-22 NOTE — Telephone Encounter (Signed)
I talked to Dr Aundra Dubin.   He wants to seen if BiV pacing can be improved.   Amy Clegg  NP-C  9:25 AM

## 2020-01-22 NOTE — Telephone Encounter (Addendum)
LMOM for patient requesting call back to DC. Direct number and office hours provided.  Per Dr. Lovena Le, if patient can come in this afternoon, can try to pace him out of AT if rhythm is ongoing. Original transmission is from 01/19/20, patient was A/V paced at 75bpm per OV note from A. Ninfa Meeker, NP, on 01/20/20.

## 2020-01-23 NOTE — Telephone Encounter (Signed)
Patient's wife, [name] [facility]), returned call. Advised of Dr. Tanna Furry recommendations. She is in agreement with plan to wait on 02/20/20 transmission results prior to making any other changes. She denies additional questions or concerns at this time.

## 2020-01-25 ENCOUNTER — Ambulatory Visit: Payer: Medicare Other | Attending: Internal Medicine

## 2020-01-25 ENCOUNTER — Other Ambulatory Visit (HOSPITAL_COMMUNITY): Payer: Self-pay | Admitting: Internal Medicine

## 2020-01-25 DIAGNOSIS — Z23 Encounter for immunization: Secondary | ICD-10-CM | POA: Insufficient documentation

## 2020-01-25 NOTE — Progress Notes (Signed)
   Covid-19 Vaccination Clinic  Name:  [name]    MRN: [id number] DOB: 07/12/1946  01/25/2020  [name] was observed post Covid-19 immunization for 15 minutes without incidence. He was provided with Vaccine Information Sheet and instruction to access the V-Safe system.   [name] was instructed to call 911 with any severe reactions post vaccine: Marland Kitchen Difficulty breathing  . Swelling of your face and throat  . A fast heartbeat  . A bad rash all over your body  . Dizziness and weakness    Immunizations Administered    Name Date Dose VIS Date Route   Pfizer COVID-19 Vaccine 01/25/2020  9:21 AM 0.3 mL 11/21/2019 Intramuscular   Manufacturer: [place]   Lot: [id number]   [place]: [id number]

## 2020-02-10 ENCOUNTER — Other Ambulatory Visit (HOSPITAL_COMMUNITY): Payer: Self-pay | Admitting: Internal Medicine

## 2020-02-13 DIAGNOSIS — R351 Nocturia: Secondary | ICD-10-CM | POA: Diagnosis not present

## 2020-02-13 DIAGNOSIS — N471 Phimosis: Secondary | ICD-10-CM | POA: Diagnosis not present

## 2020-02-13 DIAGNOSIS — N401 Enlarged prostate with lower urinary tract symptoms: Secondary | ICD-10-CM | POA: Diagnosis not present

## 2020-02-16 ENCOUNTER — Ambulatory Visit: Payer: Medicare Other | Attending: Internal Medicine

## 2020-02-16 ENCOUNTER — Telehealth: Payer: Self-pay | Admitting: Emergency Medicine

## 2020-02-16 DIAGNOSIS — Z23 Encounter for immunization: Secondary | ICD-10-CM | POA: Insufficient documentation

## 2020-02-16 NOTE — Telephone Encounter (Signed)
Spoke with the patient and he reports he feels better since his last office visit with Dr Lovena Le on 01/15/20. Patient has felt palpitations that are shorter in duration. Will notify Dr Lovena Le of decrease in BiV pacing % and patient informed if his treatment plan is changed then he will receive a call .

## 2020-02-16 NOTE — Progress Notes (Signed)
   Covid-19 Vaccination Clinic  Name:  [name]    MRN: [id number] DOB: 07/08/1946  02/16/2020  [name] was observed post Covid-19 immunization for 30 minutes based on pre-vaccination screening without incident. He was provided with Vaccine Information Sheet and instruction to access the V-Safe system.   [name] was instructed to call 911 with any severe reactions post vaccine: Marland Kitchen Difficulty breathing  . Swelling of face and throat  . A fast heartbeat  . A bad rash all over body  . Dizziness and weakness   Immunizations Administered    Name Date Dose VIS Date Route   Pfizer COVID-19 Vaccine 02/16/2020  6:26 PM 0.3 mL 11/21/2019 Intramuscular   Manufacturer: [place]   Lot: [id number]   [place]: [id number]

## 2020-02-16 NOTE — Telephone Encounter (Signed)
LMOM, Assess patient due to decreased BiV pacing to 80% from 88 %. Total PVCs decreased from last visit with Dr Lovena Le. [facility] and she will have patient call office when she arrives home.

## 2020-02-19 NOTE — Telephone Encounter (Signed)
Continue current meds 

## 2020-02-20 ENCOUNTER — Ambulatory Visit (INDEPENDENT_AMBULATORY_CARE_PROVIDER_SITE_OTHER): Payer: Medicare Other | Admitting: *Deleted

## 2020-02-20 DIAGNOSIS — Z95810 Presence of automatic (implantable) cardiac defibrillator: Secondary | ICD-10-CM | POA: Diagnosis not present

## 2020-02-20 LAB — CUP PACEART REMOTE DEVICE CHECK
Battery Remaining Longevity: 54 mo
Battery Remaining Percentage: 71 %
Brady Statistic RA Percent Paced: 54 %
Brady Statistic RV Percent Paced: 77 %
Date Time Interrogation Session: 20210312025300
HighPow Impedance: 45 Ohm
Implantable Lead Implant Date: 20060411
Implantable Lead Implant Date: 20151030
Implantable Lead Implant Date: 20151030
Implantable Lead Location: 753858
Implantable Lead Location: 753859
Implantable Lead Location: 753860
Implantable Lead Model: 148
Implantable Lead Model: 4136
Implantable Lead Serial Number: 145070
Implantable Lead Serial Number: 29713415
Implantable Pulse Generator Implant Date: 20151030
Lead Channel Impedance Value: 565 Ohm
Lead Channel Impedance Value: 603 Ohm
Lead Channel Impedance Value: 702 Ohm
Lead Channel Pacing Threshold Amplitude: 0.4 V
Lead Channel Pacing Threshold Amplitude: 0.9 V
Lead Channel Pacing Threshold Amplitude: 0.9 V
Lead Channel Pacing Threshold Pulse Width: 0.4 ms
Lead Channel Pacing Threshold Pulse Width: 0.4 ms
Lead Channel Pacing Threshold Pulse Width: 0.9 ms
Lead Channel Setting Pacing Amplitude: 2 V
Lead Channel Setting Pacing Amplitude: 2 V
Lead Channel Setting Pacing Amplitude: 3.5 V
Lead Channel Setting Pacing Pulse Width: 0.4 ms
Lead Channel Setting Pacing Pulse Width: 0.9 ms
Lead Channel Setting Sensing Sensitivity: 0.6 mV
Lead Channel Setting Sensing Sensitivity: 1 mV
Pulse Gen Serial Number: 370874

## 2020-02-20 NOTE — Progress Notes (Signed)
ICD Remote  

## 2020-02-23 ENCOUNTER — Ambulatory Visit (INDEPENDENT_AMBULATORY_CARE_PROVIDER_SITE_OTHER): Payer: Medicare Other | Admitting: Family

## 2020-02-23 DIAGNOSIS — J440 Chronic obstructive pulmonary disease with acute lower respiratory infection: Secondary | ICD-10-CM | POA: Diagnosis not present

## 2020-02-23 DIAGNOSIS — J209 Acute bronchitis, unspecified: Secondary | ICD-10-CM

## 2020-02-23 MED ORDER — PREDNISONE 20 MG PO TABS: 40.0000 mg | ORAL_TABLET | Freq: Every day | ORAL | 0 refills | Status: DC

## 2020-02-23 MED ORDER — AMOXICILLIN-POT CLAVULANATE 875-125 MG PO TABS: 1.0000 | ORAL_TABLET | Freq: Two times a day (BID) | ORAL | 0 refills | Status: AC

## 2020-02-23 MED ORDER — BENZONATATE 100 MG PO CAPS: 200.0000 mg | ORAL_CAPSULE | Freq: Three times a day (TID) | ORAL | 0 refills | Status: DC | PRN

## 2020-02-23 NOTE — Progress Notes (Signed)
[name] is a 74 y.o. male with the following history as recorded in EpicCare:  Patient Active Problem List   Diagnosis Date Noted  . Nodule of upper lobe of left lung 09/18/2019  . Chronic respiratory failure with hypoxia ([place]) 12/01/2018  . D-dimer, elevated 10/16/2018  . Atherosclerosis of native arteries of extremity with intermittent claudication ([place]) 02/06/2018  . Carotid artery disease ([place]) 10/08/2015  . Carotid artery occlusion with infarction ([place]) 10/08/2015  . Nodule of right lung 04/12/2015  . AAA (abdominal aortic aneurysm) without rupture ([place]) 04/02/2015  . Iron deficiency anemia 02/23/2015  . Hyperglycemia 02/17/2015  . Morbid obesity ([place]) 05/27/2014  . Chronic combined systolic and diastolic CHF, NYHA class 3 ([place]) 03/02/2014  . Diastolic dysfunction- grade 2 03/02/2014  . CAD -SVG-RCA PCI '99, CFX PCI '99. Cath '05 and '08 - medical Rx 03/02/2014  . Biventricular ICD (implantable cardioverter-defibrillator) in place 02/24/2014  . Cardiomyopathy, ischemic- EF 20-25% 2D 02/25/14 11/24/2013  . PAD (peripheral artery disease) ([place]) 11/24/2013  . Hyperlipidemia with target LDL less than 70 06/18/2013  . OSA on CPAP 04/18/2012  . Hypothyroidism 04/18/2012  . COPD mixed type ([place]) 10/14/2010  . Essential hypertension 11/22/2009  . HYPERTROPHY PROSTATE W/UR OBST & OTH LUTS 11/22/2009  . GERD 03/23/2009    Current Outpatient Medications  Medication Sig Dispense Refill  . levothyroxine (SYNTHROID) 50 MCG tablet TAKE 1 TABLET BY MOUTH  DAILY BEFORE BREAKFAST 90 tablet 0  . nitroGLYCERIN (NITROSTAT) 0.4 MG SL tablet DISSOLVE 1 TABLET UNDER THE TONGUE EVERY 5 MINUTES AS  NEEDED FOR CHEST PAIN , MAX 3 TABLETS IN 15 MIN, CALL  911 IF CHEST PAIN PERSISTS 25 tablet 0  . NON FORMULARY at bedtime. CPAP And during the day as needed    . rivaroxaban (XARELTO) 20 MG TABS tablet Take 1 tablet (20 mg total) by mouth daily with supper. 30 tablet 5  . spironolactone (ALDACTONE)  25 MG tablet TAKE 1 TABLET BY MOUTH  DAILY 90 tablet 3  . torsemide (DEMADEX) 20 MG tablet Take 3 tablets (60 mg total) by mouth every morning AND 2 tablets (40 mg total) every evening. 150 tablet 5  . VASCEPA 1 g capsule TAKE 2 CAPSULES (2 G TOTAL) BY MOUTH 2 (TWO) TIMES A DAY. 120 capsule 11  . albuterol (PROVENTIL HFA;VENTOLIN HFA) 108 (90 BASE) MCG/ACT inhaler Inhale 2 puffs into the lungs every 6 (six) hours as needed for wheezing or shortness of breath. 3 Inhaler 4  . albuterol (PROVENTIL) (2.5 MG/3ML) 0.083% nebulizer solution USE 1 VIAL IN NEBULIZER 4 TIMES DAILY 75 mL 11  . amiodarone (PACERONE) 200 MG tablet TAKE 1 TABLET BY MOUTH EVERY DAY 90 tablet 1  . amoxicillin-clavulanate (AUGMENTIN) 875-125 MG tablet Take 1 tablet by mouth 2 (two) times daily for 10 days. 20 tablet 0  . atorvastatin (LIPITOR) 80 MG tablet TAKE 1 TABLET BY MOUTH EVERY DAY 90 tablet 2  . ATROVENT HFA 17 MCG/ACT inhaler TAKE 2 PUFFS BY MOUTH EVERY 4 HOURS AS NEEDED FOR WHEEZE  12  . benzonatate (TESSALON) 100 MG capsule Take 2 capsules (200 mg total) by mouth 3 (three) times daily as needed for cough. 20 capsule 0  . carvedilol (COREG) 25 MG tablet Take 1 tablet (25 mg total) by mouth 2 (two) times daily with a meal. 180 tablet 1  . diazepam (VALIUM) 5 MG tablet 1-2 tabs every 8 hours if needed for tremor 30 tablet 5  . ENTRESTO 49-51 MG  TAKE 1 TABLET BY MOUTH TWICE A DAY 60 tablet 6  . ezetimibe (ZETIA) 10 MG tablet Take 1 tablet (10 mg total) by mouth daily. 90 tablet 3  . finasteride (PROSCAR) 5 MG tablet Take 5 mg by mouth daily.    . fluticasone (FLONASE) 50 MCG/ACT nasal spray Instill 1 spray in each  nostril daily 32 g 3  . Fluticasone-Umeclidin-Vilant (TRELEGY ELLIPTA) 100-62.5-25 MCG/INH AEPB Inhale 1 puff into the lungs daily. Rinse mouth 60 each 12  . isosorbide mononitrate (IMDUR) 60 MG 24 hr tablet TAKE 1 TABLET BY MOUTH  DAILY 90 tablet 3  . KLOR-CON M20 20 MEQ tablet TAKE 1 TABLET BY MOUTH EVERY DAY 90  tablet 2  . predniSONE (DELTASONE) 20 MG tablet Take 2 tablets (40 mg total) by mouth daily with breakfast. 10 tablet 0   No current facility-administered medications for this visit.    Allergies: Tussionex pennkinetic er [hydrocod polst-cpm polst er] and Ace inhibitors  Past Medical History:  Diagnosis Date  . AAA (abdominal aortic aneurysm) ([place])    followed by Dr. Bridgett Larsson  . Adenomatous colon polyp 01/2004  . Anemia    hx  . Automatic implantable cardioverter-defibrillator in situ   . AVM (arteriovenous malformation)   . BPH (benign prostatic hypertrophy)   . CAD (coronary artery disease)    s/p CABGx2 in 1996  . Carotid artery stenosis    LCEA - Dr. Bridgett Larsson in 2013  . CHF (congestive heart failure) ([place])   . Complication of anesthesia    claustrophobic, unabe to lie on back more than 4 hours at time due to back  . COPD (chronic obstructive pulmonary disease) ([place])   . Diabetes mellitus    DIET CONTROLLED- pt states that this was a misdiagnosis, he was treated while in the hospital  but returned home, loss a massive amount of weight and he has not had a problem with his blood sugar since. States everything has been normal for about 3 years.  . Diverticulosis   . Dyspnea   . Dysrhythmia    ICD-defibrillator  . Fatigue   . GERD (gastroesophageal reflux disease)   . H/O hiatal hernia   . History of radiation therapy 04/09/17-04/17/17   SBRT right lung 54 Gy in 3 fractions  . HLD (hyperlipidemia)   . Hypertension   . Hypothyroidism   . Ischemic cardiomyopathy   . Myocardial infarction ([place])   . 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 OSA on CPAP    AHI durign total sleep 14.69/hr, during REM 50.91/hr  . Peripheral vascular disease (HCC)    LCEA, L renal artery stent, bilat iliac stents, R SFA stenosis  . Tremor   . Ventricular tachycardia ([place]) 09/09/2014   Amiodarone was started after appropriate defibrillator shocks for ventricular tachycardia in October 2008    Past Surgical  History:  Procedure Laterality Date  . ABDOMINAL AORTIC ENDOVASCULAR STENT GRAFT [name] 01/06/2014   Procedure: ABDOMINAL AORTIC ENDOVASCULAR STENT GRAFT- GORE; ULTRASOUND GUIDED;  Surgeon: Conrad Wright, MD;  Location: [facility];  Service: Vascular;  Laterality: [name];  . ANGIOPLASTY     BILATERAL  LE  W/STENTS  . BI-VENTRICULAR IMPLANTABLE CARDIOVERTER DEFIBRILLATOR UPGRADE [name] 10/09/2014   Procedure: BI-VENTRICULAR IMPLANTABLE CARDIOVERTER DEFIBRILLATOR UPGRADE;  Surgeon: Evans Lance, MD;  Location: [facility] C[facility] LAB;  Service: Cardiovascular;  Laterality: [name];  . BIV ICD GENERTAOR CHANGE OUT  10/09/2014   UPGRADE TO BIV  BY DR Lovena Le  . 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 Adora Fridge)  . CARDIAC CATHETERIZATION  10/03/2002   SVG sequentially to OM1 & OM2 totally occluded at ostium, SVG to PDA totally occluded within previously placed prox vein graft stent, smal distal AAA, bialt iliac stents with 30% left end-stent restenosis and 50% right end-stent restnosis(Dr. Gerrie Nordmann)  . CARDIAC CATHETERIZATION  06/11/1998   L main with 20% narrowing in distal 1/3; LAD with 1st diagonla having 70% ostial narrowing, 2nd diagonal with 40% narrowing in prox third, LIMA & RIMA widely patent; in-stent restenosis of RCIA with successful PTA and new prox SVTRCA stent for residual disease (Dr. Marella Chimes)  . CARDIAC DEFIBRILLATOR PLACEMENT  06/2005   [employer] Vitality HE - ischemic cardiomyopathy - Dr. Marella Chimes  . CAROTID ENDARTERECTOMY Left 01/04/12  . CORONARY ANGIOPLASTY  01/08/2004   cutting balloon atherectomy & percutaneous intervention of RCIA in-stent restenosis (Dr. Marella Chimes)  . CORONARY ARTERY BYPASS GRAFT  11/04/1985   x2 - PDA and sequential DX-OM (Dr. Redmond Pulling)  . ENDARTERECTOMY  01/04/2012   Procedure:  ENDARTERECTOMY CAROTID;left  Surgeon: Hinda Lenis, MD;  Location: [facility];  Service: Vascular;  Laterality: Left;  with patch angioplasty  . FUDUCIAL PLACEMENT [name] 02/23/2017   Procedure: PLACEMENT OF FUDUCIAL;  Surgeon: [name], MD;  Location: [facility];  Service: Thoracic;  Laterality: [name];  . ICD GENERATOR CHANGE  05/02/2010   [employer], retail  . ILIAC ARTERY STENT Bilateral 03/1997   and L SFA PTA  . Iron infusion  June 16, 2012  . LEFT HEART CATHETERIZATION WITH CORONARY ANGIOGRAM [name] 07/06/2014   Procedure: LEFT HEART CATHETERIZATION WITH CORONARY ANGIOGRAM;  Surgeon: Peter M Martinique, MD;  Location: [facility] CATH LAB;  Service: Cardiovascular;  Laterality: [name];  . NM MYOCAR PERF WALL MOTION  09/2012   lexiscan myoview - mod-severe perfusion defect r/t infarct or scar w/mild periinfarct ishcemia in basal inferior, mid inferior, apical inferior, basal inferolateral & mid inferoalteral regions - EF 21% low risk scan  . POLYPECTOMY    . RENAL ARTERY STENT Left 03/24/2004   6x53mm Genesis stent (Dr. Marella Chimes)  . RIGHT/LEFT HEART CATH AND CORONARY ANGIOGRAPHY [name] 12/28/2017   Procedure: RIGHT/LEFT HEART CATH AND CORONARY ANGIOGRAPHY;  Surgeon: Larey Dresser, MD;  Location: [facility] CV LAB;  Service: Cardiovascular;  Laterality: [name];  . TRANSTHORACIC ECHOCARDIOGRAM  12/2012   EF 30-35; LV mod to severely dilated, mod concentric hypertrophy, severe hypokinesis of inferolateral myocardium, moderate hypokineis of anteroseptal region, grade 1 diastolic dysfunction; mod MR; LA mod-severely dialted; RV mod dialted; RA mildly dilated; PA peak pressure 71mmHg  . VIDEO BRONCHOSCOPY WITH ENDOBRONCHIAL NAVIGATION [name] 02/23/2017   Procedure: VIDEO BRONCHOSCOPY WITH ENDOBRONCHIAL NAVIGATION;  Surgeon: [name], MD;  Location: [facility] OR;  Service: Thoracic;  Laterality: [name];    Family History  Problem Relation Age of Onset  . Lung cancer Mother   . Cancer Mother   . Diabetes  Mother   . Hypertension Mother   . Hyperlipidemia Mother   . Heart disease Mother        before age 20  . Heart attack Father   . Heart disease Father        before age 12  . Hypertension Father   .  Colon cancer Sister   . Cancer Sister   . Hypertension Sister   . Hyperlipidemia Sister   . Parkinson's disease Sister   . Diabetes Sister   . Heart disease Sister        before age 59  . Heart attack Maternal Grandmother   . Diabetes Daughter     Social History   Tobacco Use  . Smoking status: Former Smoker    Packs/day: 1.50    Years: 40.00    Pack years: 60.00    Types: Cigarettes, Pipe    Quit date: 12/11/2002    Years since quitting: 17.2  . Smokeless tobacco: Never Used  . Tobacco comment: Pt reports smoking 1 pack/day before quitting in 2004  Substance Use Topics  . Alcohol use: No    Alcohol/week: 0.0 standard drinks    Subjective:   I connected with [name] on 02/23/20 at  3:40 PM EDT by a video enabled telemedicine application and verified that I am speaking with the correct person using two identifiers.   I discussed the limitations of evaluation and management by telemedicine and the availability of in person appointments. The patient expressed understanding and agreed to proceed. Provider in office/ patient is at home; provider, patient and patient's wife are only 3 people on video call.   Concern for COPD flare; notes he has had "bronchitis" x 1 week and feeling like he is having more difficulty with his breathing; has completed both COVID vaccines; no fever; using his Trelegy and albuterol as prescribed; no concerns for CHF flare- no weight change or swelling in extremities;    Objective:  There were no vitals filed for this visit.  General: Well developed, well nourished, in no acute distress; speaks in full, complete sentences during course of phone call Head: Normocephalic and atraumatic  Lungs: Respirations unlabored;  Neurologic: Alert and  oriented; speech intact; face symmetrical; moves all extremities well; CNII-XII intact without focal deficit   Assessment:  1. COPD with acute bronchitis (HCC)     Plan:  Rx for Augmentin 875 mg bid x 10 days, Prednisone 40 mg qd x 5 days and Tessalon Perles 200 mg tid; strict follow-up/ ER precautions discussed due to complicated medical history;  Otherwise, plan to see his PCP next month in follow-up to re-check thyroid level; may want to consider CXR to ensure complete resolution of current flare;   No follow-ups on file.  No orders of the defined types were placed in this encounter.   Requested Prescriptions   Signed Prescriptions Disp Refills  . amoxicillin-clavulanate (AUGMENTIN) 875-125 MG tablet 20 tablet 0    Sig: Take 1 tablet by mouth 2 (two) times daily for 10 days.  . predniSONE (DELTASONE) 20 MG tablet 10 tablet 0    Sig: Take 2 tablets (40 mg total) by mouth daily with breakfast.  . benzonatate (TESSALON) 100 MG capsule 20 capsule 0    Sig: Take 2 capsules (200 mg total) by mouth 3 (three) times daily as needed for cough.

## 2020-02-27 ENCOUNTER — Ambulatory Visit (INDEPENDENT_AMBULATORY_CARE_PROVIDER_SITE_OTHER): Payer: Medicare Other | Admitting: Student

## 2020-02-27 ENCOUNTER — Other Ambulatory Visit: Payer: Self-pay

## 2020-02-27 ENCOUNTER — Telehealth: Payer: Self-pay | Admitting: Student

## 2020-02-27 DIAGNOSIS — J449 Chronic obstructive pulmonary disease, unspecified: Secondary | ICD-10-CM

## 2020-02-27 DIAGNOSIS — I5042 Chronic combined systolic (congestive) and diastolic (congestive) heart failure: Secondary | ICD-10-CM

## 2020-02-27 DIAGNOSIS — I4819 Other persistent atrial fibrillation: Secondary | ICD-10-CM

## 2020-02-27 DIAGNOSIS — I493 Ventricular premature depolarization: Secondary | ICD-10-CM

## 2020-02-27 LAB — CBC
Hematocrit: 35.5 % — ABNORMAL LOW (ref 37.5–51.0)
Hemoglobin: 11.8 g/dL — ABNORMAL LOW (ref 13.0–17.7)
MCH: 31 pg (ref 26.6–33.0)
MCHC: 33.2 g/dL (ref 31.5–35.7)
MCV: 93 fL (ref 79–97)
Platelets: 164 10*3/uL (ref 150–450)
RBC: 3.81 x10E6/uL — ABNORMAL LOW (ref 4.14–5.80)
RDW: 12.2 % (ref 11.6–15.4)
WBC: 8.3 10*3/uL (ref 3.4–10.8)

## 2020-02-27 LAB — CUP PACEART INCLINIC DEVICE CHECK
Date Time Interrogation Session: 20210319132755
HighPow Impedance: 38 Ohm
HighPow Impedance: 46 Ohm
Implantable Lead Implant Date: 20060411
Implantable Lead Implant Date: 20151030
Implantable Lead Implant Date: 20151030
Implantable Lead Location: 753858
Implantable Lead Location: 753859
Implantable Lead Location: 753860
Implantable Lead Model: 148
Implantable Lead Model: 4136
Implantable Lead Serial Number: 145070
Implantable Lead Serial Number: 29713415
Implantable Pulse Generator Implant Date: 20151030
Lead Channel Impedance Value: 497 Ohm
Lead Channel Impedance Value: 525 Ohm
Lead Channel Impedance Value: 709 Ohm
Lead Channel Pacing Threshold Amplitude: 0.4 V
Lead Channel Pacing Threshold Amplitude: 0.9 V
Lead Channel Pacing Threshold Amplitude: 0.9 V
Lead Channel Pacing Threshold Pulse Width: 0.4 ms
Lead Channel Pacing Threshold Pulse Width: 0.4 ms
Lead Channel Pacing Threshold Pulse Width: 0.9 ms
Lead Channel Sensing Intrinsic Amplitude: 13.5 mV
Lead Channel Sensing Intrinsic Amplitude: 18.4 mV
Lead Channel Sensing Intrinsic Amplitude: 2.2 mV
Lead Channel Setting Pacing Amplitude: 2 V
Lead Channel Setting Pacing Amplitude: 2 V
Lead Channel Setting Pacing Amplitude: 3.5 V
Lead Channel Setting Pacing Pulse Width: 0.4 ms
Lead Channel Setting Pacing Pulse Width: 0.9 ms
Lead Channel Setting Sensing Sensitivity: 0.6 mV
Lead Channel Setting Sensing Sensitivity: 1 mV
Pulse Gen Serial Number: 370874

## 2020-02-27 LAB — BASIC METABOLIC PANEL
BUN/Creatinine Ratio: 33 — ABNORMAL HIGH (ref 10–24)
BUN: 58 mg/dL — ABNORMAL HIGH (ref 8–27)
CO2: 21 mmol/L (ref 20–29)
Calcium: 9 mg/dL (ref 8.6–10.2)
Chloride: 101 mmol/L (ref 96–106)
Creatinine, Ser: 1.76 mg/dL — ABNORMAL HIGH (ref 0.76–1.27)
GFR calc Af Amer: 43 mL/min/{1.73_m2} — ABNORMAL LOW (ref >59)
GFR calc non Af Amer: 37 mL/min/{1.73_m2} — ABNORMAL LOW (ref >59)
Glucose: 138 mg/dL — ABNORMAL HIGH (ref 65–99)
Potassium: 4.5 mmol/L (ref 3.5–5.2)
Sodium: 137 mmol/L (ref 134–144)

## 2020-02-27 LAB — MAGNESIUM: Magnesium: 2.3 mg/dL (ref 1.6–2.3)

## 2020-02-27 NOTE — Telephone Encounter (Signed)
Latitude alert for patient receiving multiple ATP therapies yesterday.   All appear in setting of AT with RVR, likely in setting of ongoing COPD exacerbation.   Pt to come in this afternoon for reprogramming, at least temporarily.  Will discuss further with Dr. Lovena Le.   Legrand Como [address]" [place], PA-C  02/27/2020 9:59 AM   All other EGMs appear similar to below.

## 2020-02-27 NOTE — Telephone Encounter (Signed)
The pt wife called back stating the pt is having CP. I let her talk with Jonni Sanger, [place].

## 2020-02-27 NOTE — Progress Notes (Signed)
ICD check in clinic. Normal device function. Pt called to come into device clinic for multiple ATP for AT/AF with RVR. 11 episodes total, all available EGMs appear to be atrially driven. Discussed with Dr. Lovena Le and adjusted VT windows. Fastest rate yesterday was 176 bpm. VT-1 zone changed to 180 bpm and detection extended to 20.0s per [facility] rep recommendation. VT-2 zone moved from 190-200. VF zone left as is at 220. RV/LV Thresholds and sensing consistent with previous device measurements. RA threshold not tested in AT/AF. Impedance trends stable over time. Device programmed at appropriate safety margins. Estimated longevity 4 years. Pt enrolled in remote follow-up. Patient education completed including shock plan. RTC 3 weeks for potentially reprogramming to previous settings after acute illness.  Labs drawn as well.  Continue amio and coreg. Encouraged to follow up with PCP on Monday if COPD exacerbation not better.  Pt complains of chronic, stable angina that has slightly increased in setting of COPD exacerbation. No exertional component. Resolves with NTG.    Instructed to report to ED with any chest pain that is unresolved by NTG, or is accompanied by increased SOB, near syncope, syncope, diaphoresis, or overall worsening of symptoms. Suspect this has been exacerbated in the setting of his COPD exacerbation.  LHC 12/2017 with known significant but stable CAD.

## 2020-02-27 NOTE — Telephone Encounter (Signed)
Pt having CP likely consistent with his AT with RVR; he has been having this pain since his productive cough started.  No exertional component.   Will continue to follow. He has in office check this afternoon to discuss further.   Legrand Como [address]" [place], PA-C  02/27/2020 10:58 AM

## 2020-03-19 ENCOUNTER — Other Ambulatory Visit: Payer: Self-pay

## 2020-03-19 ENCOUNTER — Other Ambulatory Visit (HOSPITAL_COMMUNITY): Payer: Self-pay | Admitting: Cardiology

## 2020-03-19 ENCOUNTER — Encounter (HOSPITAL_COMMUNITY): Payer: Self-pay | Admitting: Cardiology

## 2020-03-19 ENCOUNTER — Ambulatory Visit (HOSPITAL_COMMUNITY)
Admission: RE | Admit: 2020-03-19 | Discharge: 2020-03-19 | Disposition: A | Discharge status: Home / Self Care | Payer: Medicare Other | Source: Ambulatory Visit | Attending: Cardiology | Admitting: Cardiology

## 2020-03-19 VITALS — BP 106/52 | HR 70 | Wt 281.0 lb

## 2020-03-19 DIAGNOSIS — Z95810 Presence of automatic (implantable) cardiac defibrillator: Secondary | ICD-10-CM | POA: Diagnosis not present

## 2020-03-19 DIAGNOSIS — Z951 Presence of aortocoronary bypass graft: Secondary | ICD-10-CM | POA: Diagnosis not present

## 2020-03-19 DIAGNOSIS — I5022 Chronic systolic (congestive) heart failure: Secondary | ICD-10-CM | POA: Insufficient documentation

## 2020-03-19 DIAGNOSIS — I2510 Atherosclerotic heart disease of native coronary artery without angina pectoris: Secondary | ICD-10-CM | POA: Diagnosis not present

## 2020-03-19 DIAGNOSIS — I4819 Other persistent atrial fibrillation: Secondary | ICD-10-CM | POA: Diagnosis not present

## 2020-03-19 DIAGNOSIS — I110 Hypertensive heart disease with heart failure: Secondary | ICD-10-CM | POA: Diagnosis not present

## 2020-03-19 DIAGNOSIS — R251 Tremor, unspecified: Secondary | ICD-10-CM | POA: Diagnosis not present

## 2020-03-19 DIAGNOSIS — I5042 Chronic combined systolic (congestive) and diastolic (congestive) heart failure: Secondary | ICD-10-CM | POA: Diagnosis not present

## 2020-03-19 DIAGNOSIS — I255 Ischemic cardiomyopathy: Secondary | ICD-10-CM | POA: Diagnosis not present

## 2020-03-19 DIAGNOSIS — J449 Chronic obstructive pulmonary disease, unspecified: Secondary | ICD-10-CM | POA: Diagnosis not present

## 2020-03-19 DIAGNOSIS — Z7901 Long term (current) use of anticoagulants: Secondary | ICD-10-CM | POA: Insufficient documentation

## 2020-03-19 DIAGNOSIS — I480 Paroxysmal atrial fibrillation: Secondary | ICD-10-CM | POA: Insufficient documentation

## 2020-03-19 DIAGNOSIS — Z79899 Other long term (current) drug therapy: Secondary | ICD-10-CM | POA: Diagnosis not present

## 2020-03-19 DIAGNOSIS — E785 Hyperlipidemia, unspecified: Secondary | ICD-10-CM | POA: Diagnosis not present

## 2020-03-19 LAB — BASIC METABOLIC PANEL
Anion gap: 11 (ref 5–15)
BUN: 43 mg/dL — ABNORMAL HIGH (ref 8–23)
CO2: 26 mmol/L (ref 22–32)
Calcium: 9.1 mg/dL (ref 8.9–10.3)
Chloride: 103 mmol/L (ref 98–111)
Creatinine, Ser: 1.63 mg/dL — ABNORMAL HIGH (ref 0.61–1.24)
GFR calc Af Amer: 47 mL/min — ABNORMAL LOW (ref >60)
GFR calc non Af Amer: 41 mL/min — ABNORMAL LOW (ref >60)
Glucose, Bld: 127 mg/dL — ABNORMAL HIGH (ref 70–99)
Potassium: 4.4 mmol/L (ref 3.5–5.1)
Sodium: 140 mmol/L (ref 135–145)

## 2020-03-19 LAB — CBC
HCT: 41.2 % (ref 39.0–52.0)
Hemoglobin: 12.8 g/dL — ABNORMAL LOW (ref 13.0–17.0)
MCH: 30.3 pg (ref 26.0–34.0)
MCHC: 31.1 g/dL (ref 30.0–36.0)
MCV: 97.6 fL (ref 80.0–100.0)
Platelets: 130 10*3/uL — ABNORMAL LOW (ref 150–400)
RBC: 4.22 MIL/uL (ref 4.22–5.81)
RDW: 13.8 % (ref 11.5–15.5)
WBC: 7.2 10*3/uL (ref 4.0–10.5)
nRBC: 0 % (ref 0.0–0.2)

## 2020-03-19 MED ORDER — EZETIMIBE 10 MG PO TABS: 10.0000 mg | ORAL_TABLET | Freq: Every day | ORAL | 3 refills | Status: DC

## 2020-03-19 MED ORDER — ISOSORBIDE MONONITRATE ER 60 MG PO TB24: 90.0000 mg | ORAL_TABLET | Freq: Every day | ORAL | 5 refills | Status: DC

## 2020-03-19 NOTE — Patient Instructions (Addendum)
INCREASE Imdur to 90mg  (1.5 tabs) daily  A refill for Zetia 10mg  has been sent to your pharmacy   Labs today We will only contact you if something comes back abnormal or we need to make some changes. Otherwise no news is good news!   Your physician recommends that you schedule a follow-up appointment in: 6 weeks with Dr Aundra Dubin    Please call office at [phone] option 2 if you have any questions or concerns.    At the [facility], you and your health needs are our priority. As part of our continuing mission to provide you with exceptional heart care, we have created designated Provider Care Teams. These Care Teams include your primary Cardiologist (physician) and Advanced Practice Providers (APPs- Physician Assistants and Nurse Practitioners) who all work together to provide you with the care you need, when you need it.   You may see any of the following providers on your designated Care Team at your next follow up: Marland Kitchen Dr [name] . Dr [name] . Darrick Grinder, NP . Lyda Jester, PA . Audry Riles, PharmD   Please be sure to bring in all your medications bottles to every appointment.

## 2020-03-21 NOTE — Progress Notes (Signed)
Date:  03/21/2020   ID:  [name], DOB [birth year], MRN [id number]    Provider location: [place] [facility] Type of Visit: Established patient  PCP:  [name], MD  Cardiologist:  [name]  Chief Complaint: Shortness of breath   History of Present Illness: AYMEN WIDRIG is a 74 y.o. male who has a past medical history significant for morbid obesity, systolic heart failure with an EF of 30-35% (January 2014) -> 20-25% (March 2015 - RV normal), VT on amio, CAD s/p CABG 1996, LBBB, COPD, obstructive sleep apnea on CPAP, AAA repair (January 2015).   Admitted to [facility] July 24 through July 06 2014 with chest pain.  CEs negative. Had a LHC with no change from previous LHC with recommendations to continue medical management. Discharge weight was 255 pounds.   LHC 07/06/14 --No significant change from previous studies.  Left anterior descending (LAD): The LAD is a large vessel. There is a 40% stenosis immediately after the takeoff of a large septal perforator. There are 2 large diagonal branches without significant disease. Left circumflex (LCx): The LCx is occluded proximally. There are left to left collaterals.  Right coronary artery (RCA): The RCA is occluded proximally immediately following the conus branch. There are right to right and left to right collaterals. SVGs from CABG known to be totally occluded.   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   CTA chest/abd/pelvis (3/16) with moderate emphysema, 1.3 cm nodule RUL, left SFA totally occluded, right SFA with significant stenosis, s/p endovascular AAA repair (stable).   Echo (5/17) with EF 20-25%, severe LV dilation, mild MR, normal RV size with mildly decreased systolic function.   He has a RUL nodule that is most likely lung cancer, he is being treated with radiation.   Echo (8/18) with EF 30%, basal-mid inferior and inferolateral AK, severe LV dilation, normal RV, mild MR.   RHC/LHC was  done in 1/19 due to worsening exertional dyspnea.  This showed normal filling pressures and preserved cardiac output.  There was 30-40% mLAD stenosis.  The LAD provided collaterals to the LCx and RCA territories.  The LCx, RCA, SVG-OM, and SVG-PDA were all chronically occluded (known from the past).  No new disease.   [facility] at [facility] with 0.78 on right, 0.59 on left => left iliac > 50% stenosis.  11/19 ABIs 0.78 right, 0.59 left.  3/20 ABIs 0.74 right, 0.66 left.   He saw neurology about his tremor.  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  However, he is on amiodarone which may be contributing.  He has been seeing Dr. Vertell Limber with plan for deep brain stimulator, on hold currently due to cancer treatment.   He had chest radiation for RLL lung cancer.  However, CT showed new RUL lesion in 11/19 and he restart XRT in 12/19, repeat XRT is now completed.   Followup CT chest showed a lesion in the left upper lobe.  He had XRT again, finishing in 9/20.   Echo in 7/20 showed EF 35-40%, basal to mid inferolateral and inferior akinesis, mildly decreased RV systolic function, mild AS, PASP 22 mmHg.    He returns for followup of CHF and CAD.  Weight is down 10 lbs.  He has had episodes of tachypalpitations, more commonly several weeks ago and now improved.  In 3/21, device interrogation showed atrial fibrillation with RVR.  When his heart was racing, he would note chest tightness.  This  happened 7-8 times last week but has not happened this week.  No exertional chest pain.  He continues to have significant exertional dyspnea, he is short of breath just walking across the house.  No orthopnea/PND.   He continues to use home oxygen because of COPD.   [employer] device interrogation: He had episodes of atrial fibrillation but not in the last week.   ECG (personally reviewed): NSR with BiV pacing, he had a short run of regular SVT on a rhythm strip  Labs 3/15 Cr 1.5 K 5.6 Labs 07/06/14 K 3.9  Creatinine  0.98 Labs 9/15 K 5.1, creatinine 0.96 Labs 11/15 K 3.8, creatinine 0.83, LDL 61, LFTs normal, TSH normal Labs 11/26/14 K 5.0 Creatinine 0.73 , LFTs normal, TSH normal,  Labs 12/08/14 K 3.4 Creatinine 0.83  Labs 3/16 K 4.4, creatinine 1.03, LFTs normal, TSH normal, HCT 37.8, BNP 65 Labs 6/16 K 4.1, creatinine 0.93, TSH normal, LFTs normal Labs 11/16 K 5.1, creatinine 1.13, LFTs normal, TSH normal, LDL 83, HDL 33, TGs 162 Labs 1/17 K 4.4, creatinine 0.86, pro-BNP 154 Labs 2/17 BNP 113, K 3.8, creatinine 0.79, TSH normal, LFTs normal Labs 4/17 K 3.8, creatinine 0.91, HCT 48.1 Labs 6/17 K 3.5, creatinine 0.98 Labs 9/17 K 3.9, creatinine 0.79, LDL 69, HDL 29, TSH normal, LFTs normal Labs 3/18 K 3.6, creatinine 0.87 Labs 8/18 K 3.3 => 4.2, creatinine 0.78 => 1.04, LDL 66, HDL 38, BNP 166 Labs 9/18: K 4.7, creatinine 0.9, BNP 144 Labs 11/18: K 4.8, creatinine 1.05, TSH normal, LFTs normal.  Labs 1/19: K 4.4, creatinine 1.08, LFTs normal Labs 5/19: LDL 60, HDL 28, TSH normal, LFTs normal, K 4.6, creatinine 1.43 Labs 11/19: K 4.6, creatinine 1.42, LFTs normal, TSH normal, Mg 2.3 Labs 1/20: TSH normal, K 4.2, creatinine 1.12, LFTs normal Labs 4/20: LDL 42, HDL 28, TGs 234 Labs 5/20: K 4.8 => 4.4, creatinine 1.95 => 1.38, TSH normal, LFTs normal Labs 7/20: K 4.1, creatinine 1.22 Labs 9/20: LDL 36, HDL 29 Labs 3/21: K 4.5, creatinine 1.76, hgb 11.8  Past Medical History:  Diagnosis Date  . AAA (abdominal aortic aneurysm) ([place])    followed by Dr. Bridgett Larsson  . Adenomatous colon polyp 01/2004  . Anemia    hx  . Automatic implantable cardioverter-defibrillator in situ   . AVM (arteriovenous malformation)   . BPH (benign prostatic hypertrophy)   . CAD (coronary artery disease)    s/p CABGx2 in 1996  . Carotid artery stenosis    LCEA - Dr. Bridgett Larsson in 2013  . CHF (congestive heart failure) ([place])   . Complication of anesthesia    claustrophobic, unabe to lie on back more than 4 hours at  time due to back  . COPD (chronic obstructive pulmonary disease) ([place])   . Diabetes mellitus    DIET CONTROLLED- pt states that this was a misdiagnosis, he was treated while in the hospital  but returned home, loss a massive amount of weight and he has not had a problem with his blood sugar since. States everything has been normal for about 3 years.  . Diverticulosis   . Dyspnea   . Dysrhythmia    ICD-defibrillator  . Fatigue   . GERD (gastroesophageal reflux disease)   . H/O hiatal hernia   . History of radiation therapy 04/09/17-04/17/17   SBRT right lung 54 Gy in 3 fractions  . HLD (hyperlipidemia)   . Hypertension   . Hypothyroidism   . Ischemic cardiomyopathy   .  Myocardial infarction ([place])   . 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 OSA on CPAP    AHI durign total sleep 14.69/hr, during REM 50.91/hr  . Peripheral vascular disease ([facility])    LCEA, L renal artery stent, bilat iliac stents, R SFA stenosis  . Tremor   . Ventricular tachycardia ([place]) 09/09/2014   Amiodarone was started after appropriate defibrillator shocks for ventricular tachycardia in October 2008   Past Surgical History:  Procedure Laterality Date  . ABDOMINAL AORTIC ENDOVASCULAR STENT GRAFT [name] 01/06/2014   Procedure: ABDOMINAL AORTIC ENDOVASCULAR STENT GRAFT- GORE; ULTRASOUND GUIDED;  Surgeon: Conrad [place], MD;  Location: [place];  Service: Vascular;  Laterality: [name];  . ANGIOPLASTY     BILATERAL  LE  W/STENTS  . BI-VENTRICULAR IMPLANTABLE CARDIOVERTER DEFIBRILLATOR UPGRADE [name] 10/09/2014   Procedure: BI-VENTRICULAR IMPLANTABLE CARDIOVERTER DEFIBRILLATOR UPGRADE;  Surgeon: Evans Lance, MD;  Location[facility] C[facility] LAB;  Service: Cardiovascular;  Laterality: [name];  . BIV ICD GENERTAOR CHANGE OUT  10/09/2014   UPGRADE TO BIV        BY DR Lovena Le  . CARDIAC CATHETERIZATION  09/16/2007   occlusion of both vein grafts, no significant LAD disease or diagonal disease, Cfx collaterals from the left, 70% in-stent restenosis of L renal  artery, normal L main, RCA occluded ostially (Dr. Adora Fridge)  . CARDIAC CATHETERIZATION  10/03/2002   SVG sequentially to OM1 & OM2 totally occluded at ostium, SVG to PDA totally occluded within previously placed prox vein graft stent, smal distal AAA, bialt iliac stents with 30% left end-stent restenosis and 50% right end-stent restnosis(Dr. Gerrie Nordmann)  . CARDIAC CATHETERIZATION  06/11/1998   L main with 20% narrowing in distal 1/3; LAD with 1st diagonla having 70% ostial narrowing, 2nd diagonal with 40% narrowing in prox third, LIMA & RIMA widely patent; in-stent restenosis of RCIA with successful PTA and new prox SVTRCA stent for residual disease (Dr. Marella Chimes)  . CARDIAC DEFIBRILLATOR PLACEMENT  06/2005   [employer] Vitality HE - ischemic cardiomyopathy - Dr. Marella Chimes  . CAROTID ENDARTERECTOMY Left 01/04/12  . CORONARY ANGIOPLASTY  01/08/2004   cutting balloon atherectomy & percutaneous intervention of RCIA in-stent restenosis (Dr. Marella Chimes)  . CORONARY ARTERY BYPASS GRAFT  11/04/1985   x2 - PDA and sequential DX-OM (Dr. Redmond Pulling)  . ENDARTERECTOMY  01/04/2012   Procedure: ENDARTERECTOMY CAROTID;left  Surgeon: Hinda Lenis, MD;  Location: [place];  Service: Vascular;  Laterality: Left;  with patch angioplasty  . FUDUCIAL PLACEMENT [name] 02/23/2017   Procedure: PLACEMENT OF FUDUCIAL;  Surgeon: [name], MD;  Location: [place];  Service: Thoracic;  Laterality: [name];  . ICD GENERATOR CHANGE  05/02/2010   [employer], retail  . ILIAC ARTERY STENT Bilateral 03/1997   and L SFA PTA  . Iron infusion  June 16, 2012  . LEFT HEART CATHETERIZATION WITH CORONARY ANGIOGRAM [name] 07/06/2014   Procedure: LEFT HEART CATHETERIZATION WITH CORONARY ANGIOGRAM;  Surgeon: Peter M Martinique, MD;  Location: [facility] CATH LAB;  Service: Cardiovascular;  Laterality: [name];  . NM MYOCAR PERF WALL MOTION  09/2012   lexiscan myoview - mod-severe perfusion defect r/t infarct or scar w/mild periinfarct ishcemia in  basal inferior, mid inferior, apical inferior, basal inferolateral & mid inferoalteral regions - EF 21% low risk scan  . POLYPECTOMY    . RENAL ARTERY STENT Left 03/24/2004   6x1mm Genesis stent (Dr. Marella Chimes)  . RIGHT/LEFT HEART CATH AND CORONARY ANGIOGRAPHY [name] 12/28/2017  Procedure: RIGHT/LEFT HEART CATH AND CORONARY ANGIOGRAPHY;  Surgeon: Larey Dresser, MD;  Location: [place] CV LAB;  Service: Cardiovascular;  Laterality: [name];  . TRANSTHORACIC ECHOCARDIOGRAM  12/2012   EF 30-35; LV mod to severely dilated, mod concentric hypertrophy, severe hypokinesis of inferolateral myocardium, moderate hypokineis of anteroseptal region, grade 1 diastolic dysfunction; mod MR; LA mod-severely dialted; RV mod dialted; RA mildly dilated; PA peak pressure 47mmHg  . VIDEO BRONCHOSCOPY WITH ENDOBRONCHIAL NAVIGATION [name] 02/23/2017   Procedure: VIDEO BRONCHOSCOPY WITH ENDOBRONCHIAL NAVIGATION;  Surgeon: [name], MD;  Location: [place];  Service: Thoracic;  Laterality: [name];     Current Outpatient Medications  Medication Sig Dispense Refill  . albuterol (PROVENTIL HFA;VENTOLIN HFA) 108 (90 BASE) MCG/ACT inhaler Inhale 2 puffs into the lungs every 6 (six) hours as needed for wheezing or shortness of breath. 3 Inhaler 4  . albuterol (PROVENTIL) (2.5 MG/3ML) 0.083% nebulizer solution USE 1 VIAL IN NEBULIZER 4 TIMES DAILY 75 mL 11  . amiodarone (PACERONE) 200 MG tablet TAKE 1 TABLET BY MOUTH EVERY DAY 90 tablet 1  . atorvastatin (LIPITOR) 80 MG tablet TAKE 1 TABLET BY MOUTH EVERY DAY 90 tablet 2  . ATROVENT HFA 17 MCG/ACT inhaler TAKE 2 PUFFS BY MOUTH EVERY 4 HOURS AS NEEDED FOR WHEEZE  12  . benzonatate (TESSALON) 100 MG capsule Take 2 capsules (200 mg total) by mouth 3 (three) times daily as needed for cough. 20 capsule 0  . carvedilol (COREG) 25 MG tablet Take 1 tablet (25 mg total) by mouth 2 (two) times daily with a meal. 180 tablet 1  . diazepam (VALIUM) 5 MG tablet 1-2 tabs every 8 hours if  needed for tremor 30 tablet 5  . ENTRESTO 49-51 MG TAKE 1 TABLET BY MOUTH TWICE A DAY 60 tablet 6  . ezetimibe (ZETIA) 10 MG tablet Take 1 tablet (10 mg total) by mouth daily. 90 tablet 3  . finasteride (PROSCAR) 5 MG tablet Take 5 mg by mouth daily.    . fluticasone (FLONASE) 50 MCG/ACT nasal spray Instill 1 spray in each  nostril daily 32 g 3  . Fluticasone-Umeclidin-Vilant (TRELEGY ELLIPTA) 100-62.5-25 MCG/INH AEPB Inhale 1 puff into the lungs daily. Rinse mouth 60 each 12  . isosorbide mononitrate (IMDUR) 60 MG 24 hr tablet Take 1.5 tablets (90 mg total) by mouth daily. 45 tablet 5  . KLOR-CON M20 20 MEQ tablet TAKE 1 TABLET BY MOUTH EVERY DAY 90 tablet 2  . levothyroxine (SYNTHROID) 50 MCG tablet TAKE 1 TABLET BY MOUTH  DAILY BEFORE BREAKFAST 90 tablet 0  . nitroGLYCERIN (NITROSTAT) 0.4 MG SL tablet DISSOLVE 1 TABLET UNDER THE TONGUE EVERY 5 MINUTES AS  NEEDED FOR CHEST PAIN , MAX 3 TABLETS IN 15 MIN, CALL  911 IF CHEST PAIN PERSISTS 25 tablet 0  . NON FORMULARY at bedtime. CPAP And during the day as needed    . predniSONE (DELTASONE) 20 MG tablet Take 2 tablets (40 mg total) by mouth daily with breakfast. 10 tablet 0  . rivaroxaban (XARELTO) 20 MG TABS tablet Take 1 tablet (20 mg total) by mouth daily with supper. 30 tablet 5  . spironolactone (ALDACTONE) 25 MG tablet TAKE 1 TABLET BY MOUTH  DAILY 90 tablet 3  . torsemide (DEMADEX) 20 MG tablet Take 3 tablets (60 mg total) by mouth every morning AND 2 tablets (40 mg total) every evening. 150 tablet 5  . VASCEPA 1 g capsule TAKE 2 CAPSULES (2 G TOTAL) BY MOUTH 2 (  TWO) TIMES A DAY. 120 capsule 11   No current facility-administered medications for this encounter.    Allergies:   Tussionex pennkinetic er [hydrocod polst-cpm polst er] and Ace inhibitors   Social History:  The patient  reports that he quit smoking about 17 years ago. His smoking use included cigarettes and pipe. He has a 60.00 pack-year smoking history. He has never used  smokeless tobacco. He reports that he does not drink alcohol or use drugs.   Family History:  The patient's family history includes Cancer in his mother and sister; Colon cancer in his sister; Diabetes in his daughter, mother, and sister; Heart attack in his father and maternal grandmother; Heart disease in his father, mother, and sister; Hyperlipidemia in his mother and sister; Hypertension in his father, mother, and sister; Lung cancer in his mother; Parkinson's disease in his sister.   ROS:  Please see the history of present illness.   All other systems are personally reviewed and negative.   Exam:   BP (!) 106/52   Pulse 70   Wt 127.5 kg (281 lb)   SpO2 99% Comment: on 2.5L  BMI 42.73 kg/m  General: NAD Neck: No JVD, no thyromegaly or thyroid nodule.  Lungs: Clear to auscultation bilaterally with normal respiratory effort. CV: Nondisplaced PMI.  Heart regular S1/S2, no S3/S4, no murmur.  Trace ankle edema.  No carotid bruit.  Normal pedal pulses.  Abdomen: Soft, nontender, no hepatosplenomegaly, no distention.  Skin: Intact without lesions or rashes.  Neurologic: Alert and oriented x 3.  Psych: Normal affect. Extremities: No clubbing or cyanosis.  HEENT: Normal.   Recent Labs: 01/06/2020: ALT 18; TSH 6.916 02/27/2020: Magnesium 2.3 03/19/2020: BUN 43; Creatinine, Ser 1.63; Hemoglobin 12.8; Platelets 130; Potassium 4.4; Sodium 140  Personally reviewed   Wt Readings from Last 3 Encounters:  03/19/20 127.5 kg (281 lb)  01/20/20 132 kg (291 lb)  01/15/20 132.7 kg (292 lb 9.6 oz)      ASSESSMENT AND PLAN:  1. Chronic systolic CHF: Ischemic cardiomyopathy. Echo in 8/18 showed EF 30% with severe LV dilation and normal RV.  [employer] CRT-D.  RHC in 1/19 showed normal filling pressures and preserved cardiac output.  Last echo in 7/20 showed EF 35-40% with mildly decreased RV systolic function.  NYHA class III symptoms chronically.  He does not appear significantly volume  overloaded on exam today; I suspect that his dyspnea is more due to his COPD.    - Continue torsemide 60 qam/40 qpm.  BMET today.  - Continue Coreg 25 mg bid.   - Continue Entresto to 49/51 bid.     - Continue spironolactone 25 mg daily.  2. CAD s/p CABG: LHC 1/19 with stable anatomy (LAD is patent, other vessels occluded with collaterals from the LAD).  He has had angina with atrial fibrillation/RVR runs.  He does not regularly have angina.  - Do not think he needs coronary angiography.  - Continue atorvastatin.   - He is not on ASA due to Xarelto use.   - Increase Imdur to 90 mg daily.  - Consider LHC if anginal pattern worsens.   3. VT s/p ICD: On amiodarone for history of ICD discharges due to VT.   - Next appt need to check TSH and LFTs.  4. Morbid obesity: Weight is lower.  5. COPD: Moderate to severe.  Historically, a lot of his dyspnea has seemed to be due to COPD.   - Followup with pulmonary. - Now has  home oxygen.  6. Lung cancer: He completed radiation.  7. PAD: Occluded left SFA and significant right SFA stenosis on last CTA.  ABIs in 3/20 with significant disease bilaterally but stable.  Does not get leg pain with exertion but has nocturnal cramps.  [facility] by [facility], medical management for now.  He is on statin, good lipids in 9/20.  8. AAA: s/p repair.  Followed at [facility].  No endoleak on last evaluation.  9. Tremor: Most likely essential tremor given family history.  However, cannot rule out role for amiodarone. As above, I think that he needs to continue at least a low dose of amiodarone.  Dr. Carles Collet with neurology discussed deep brain stimulation => this is on hold while he is being treated for lung cancer.   10. Hyperlipidemia: He is on Vascepa and atorvastatin.  - Good lipids in 9/20.  11. 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  - Continue amiodarone.  - Continue Coreg 25 mg bid . 12.  Atrial fibrillation: Paroxysmal.    - Continue Coreg and amiodarone.  - Continue Xarelto, check CBC.   Signed, [name], MD  03/21/2020  [facility] [address] [facility] [place] [address] [phone] (office) [phone] (fax)

## 2020-03-23 ENCOUNTER — Ambulatory Visit (INDEPENDENT_AMBULATORY_CARE_PROVIDER_SITE_OTHER): Payer: Medicare Other

## 2020-03-23 ENCOUNTER — Encounter: Payer: Self-pay | Admitting: Internal Medicine

## 2020-03-23 ENCOUNTER — Other Ambulatory Visit: Payer: Self-pay

## 2020-03-23 ENCOUNTER — Encounter: Payer: Medicare Other | Admitting: Student

## 2020-03-23 ENCOUNTER — Ambulatory Visit (INDEPENDENT_AMBULATORY_CARE_PROVIDER_SITE_OTHER): Payer: Medicare Other | Admitting: Internal Medicine

## 2020-03-23 VITALS — BP 84/66 | HR 70 | Temp 99.0°F | Resp 20 | Ht 68.0 in | Wt 279.0 lb

## 2020-03-23 DIAGNOSIS — I10 Essential (primary) hypertension: Secondary | ICD-10-CM | POA: Diagnosis not present

## 2020-03-23 DIAGNOSIS — J0101 Acute recurrent maxillary sinusitis: Secondary | ICD-10-CM

## 2020-03-23 DIAGNOSIS — R739 Hyperglycemia, unspecified: Secondary | ICD-10-CM | POA: Diagnosis not present

## 2020-03-23 DIAGNOSIS — R059 Cough, unspecified: Secondary | ICD-10-CM

## 2020-03-23 DIAGNOSIS — D539 Nutritional anemia, unspecified: Secondary | ICD-10-CM

## 2020-03-23 DIAGNOSIS — G250 Essential tremor: Secondary | ICD-10-CM | POA: Diagnosis not present

## 2020-03-23 DIAGNOSIS — N1831 Chronic kidney disease, stage 3a: Secondary | ICD-10-CM

## 2020-03-23 DIAGNOSIS — D508 Other iron deficiency anemias: Secondary | ICD-10-CM

## 2020-03-23 DIAGNOSIS — R05 Cough: Secondary | ICD-10-CM | POA: Diagnosis not present

## 2020-03-23 DIAGNOSIS — Z23 Encounter for immunization: Secondary | ICD-10-CM | POA: Diagnosis not present

## 2020-03-23 DIAGNOSIS — J449 Chronic obstructive pulmonary disease, unspecified: Secondary | ICD-10-CM | POA: Diagnosis not present

## 2020-03-23 DIAGNOSIS — E039 Hypothyroidism, unspecified: Secondary | ICD-10-CM

## 2020-03-23 DIAGNOSIS — D510 Vitamin B12 deficiency anemia due to intrinsic factor deficiency: Secondary | ICD-10-CM | POA: Diagnosis not present

## 2020-03-23 LAB — IBC PANEL
Iron: 40 ug/dL — ABNORMAL LOW (ref 42–165)
Saturation Ratios: 12.9 % — ABNORMAL LOW (ref 20.0–50.0)
Transferrin: 222 mg/dL (ref 212.0–360.0)

## 2020-03-23 LAB — FOLATE: Folate: 15.2 ng/mL (ref >5.9)

## 2020-03-23 LAB — TSH: TSH: 4.67 u[IU]/mL — ABNORMAL HIGH (ref 0.35–4.50)

## 2020-03-23 LAB — FERRITIN: Ferritin: 55.7 ng/mL (ref 22.0–322.0)

## 2020-03-23 LAB — HEMOGLOBIN A1C: Hgb A1c MFr Bld: 6.4 % (ref 4.6–6.5)

## 2020-03-23 LAB — VITAMIN B12: Vitamin B-12: 185 pg/mL — ABNORMAL LOW (ref 211–911)

## 2020-03-23 MED ORDER — AMOXICILLIN-POT CLAVULANATE 875-125 MG PO TABS: 1.0000 | ORAL_TABLET | Freq: Two times a day (BID) | ORAL | 0 refills | Status: AC

## 2020-03-23 MED ORDER — DIAZEPAM 5 MG PO TABS: 5.0000 mg | ORAL_TABLET | Freq: Three times a day (TID) | ORAL | 5 refills | Status: DC | PRN

## 2020-03-23 MED ORDER — FUSION PLUS PO CAPS: 1.0000 | ORAL_CAPSULE | Freq: Every day | ORAL | 1 refills | Status: DC

## 2020-03-23 MED ORDER — DIAZEPAM 5 MG PO TABS: 5.0000 mg | ORAL_TABLET | Freq: Three times a day (TID) | ORAL | 5 refills | Status: AC | PRN

## 2020-03-23 NOTE — Patient Instructions (Signed)
Sinusitis, Adult Sinusitis is inflammation of your sinuses. Sinuses are hollow spaces in the bones around your face. Your sinuses are located:  Around your eyes.  In the middle of your forehead.  Behind your nose.  In your cheekbones. Mucus normally drains out of your sinuses. When your nasal tissues become inflamed or swollen, mucus can become trapped or blocked. This allows bacteria, viruses, and fungi to grow, which leads to infection. Most infections of the sinuses are caused by a virus. Sinusitis can develop quickly. It can last for up to 4 weeks (acute) or for more than 12 weeks (chronic). Sinusitis often develops after a cold. What are the causes? This condition is caused by anything that creates swelling in the sinuses or stops mucus from draining. This includes:  Allergies.  Asthma.  Infection from bacteria or viruses.  Deformities or blockages in your nose or sinuses.  Abnormal growths in the nose (nasal polyps).  Pollutants, such as chemicals or irritants in the air.  Infection from fungi (rare). What increases the risk? You are more likely to develop this condition if you:  Have a weak body defense system (immune system).  Do a lot of swimming or diving.  Overuse nasal sprays.  Smoke. What are the signs or symptoms? The main symptoms of this condition are pain and a feeling of pressure around the affected sinuses. Other symptoms include:  Stuffy nose or congestion.  Thick drainage from your nose.  Swelling and warmth over the affected sinuses.  Headache.  Upper toothache.  A cough that may get worse at night.  Extra mucus that collects in the throat or the back of the nose (postnasal drip).  Decreased sense of smell and taste.  Fatigue.  A fever.  Sore throat.  Bad breath. How is this diagnosed? This condition is diagnosed based on:  Your symptoms.  Your medical history.  A physical exam.  Tests to find out if your condition is  acute or chronic. This may include: ? Checking your nose for nasal polyps. ? Viewing your sinuses using a device that has a light (endoscope). ? Testing for allergies or bacteria. ? Imaging tests, such as an MRI or CT scan. In rare cases, a bone biopsy may be done to rule out more serious types of fungal sinus disease. How is this treated? Treatment for sinusitis depends on the cause and whether your condition is chronic or acute.  If caused by a virus, your symptoms should go away on their own within 10 days. You may be given medicines to relieve symptoms. They include: ? Medicines that shrink swollen nasal passages (topical intranasal decongestants). ? Medicines that treat allergies (antihistamines). ? A spray that eases inflammation of the nostrils (topical intranasal corticosteroids). ? Rinses that help get rid of thick mucus in your nose (nasal saline washes).  If caused by bacteria, your health care provider may recommend waiting to see if your symptoms improve. Most bacterial infections will get better without antibiotic medicine. You may be given antibiotics if you have: ? A severe infection. ? A weak immune system.  If caused by narrow nasal passages or nasal polyps, you may need to have surgery. Follow these instructions at home: Medicines  Take, use, or apply over-the-counter and prescription medicines only as told by your health care provider. These may include nasal sprays.  If you were prescribed an antibiotic medicine, take it as told by your health care provider. Do not stop taking the antibiotic even if you start   to feel better. Hydrate and humidify   Drink enough fluid to keep your urine pale yellow. Staying hydrated will help to thin your mucus.  Use a cool mist humidifier to keep the humidity level in your home above 50%.  Inhale steam for 10-15 minutes, 3-4 times a day, or as told by your health care provider. You can do this in the bathroom while a hot shower is  running.  Limit your exposure to cool or dry air. Rest  Rest as much as possible.  Sleep with your head raised (elevated).  Make sure you get enough sleep each night. General instructions   Apply a warm, moist washcloth to your face 3-4 times a day or as told by your health care provider. This will help with discomfort.  Wash your hands often with soap and water to reduce your exposure to germs. If soap and water are not available, use hand sanitizer.  Do not smoke. Avoid being around people who are smoking (secondhand smoke).  Keep all follow-up visits as told by your health care provider. This is important. Contact a health care provider if:  You have a fever.  Your symptoms get worse.  Your symptoms do not improve within 10 days. Get help right away if:  You have a severe headache.  You have persistent vomiting.  You have severe pain or swelling around your face or eyes.  You have vision problems.  You develop confusion.  Your neck is stiff.  You have trouble breathing. Summary  Sinusitis is soreness and inflammation of your sinuses. Sinuses are hollow spaces in the bones around your face.  This condition is caused by nasal tissues that become inflamed or swollen. The swelling traps or blocks the flow of mucus. This allows bacteria, viruses, and fungi to grow, which leads to infection.  If you were prescribed an antibiotic medicine, take it as told by your health care provider. Do not stop taking the antibiotic even if you start to feel better.  Keep all follow-up visits as told by your health care provider. This is important. This information is not intended to replace advice given to you by your health care provider. Make sure you discuss any questions you have with your health care provider. Document Revised: 04/29/2018 Document Reviewed: 04/29/2018 Elsevier Patient Education  2020 [employer]  

## 2020-03-23 NOTE — Progress Notes (Signed)
Subjective:  Patient ID: [name], male    DOB: 10/24/1946  Age: 74 y.o. MRN: [id number]  CC: Hypertension, Hypothyroidism, and Anemia  This visit occurred during the SARS-CoV-2 public health emergency.  Safety protocols were in place, including screening questions prior to the visit, additional usage of staff PPE, and extensive cleaning of exam room while observing appropriate contact time as indicated for disinfecting solutions.    HPI [name] presents for f/up - He tells me he has been treated for bronchitis elsewhere over the last 5 weeks.  He continues to complain of cough but he says is nonproductive.  He is blowing a lot of phlegm out of his nose and he says it is yellow and green.  He also has facial pain.  He denies fever, chills, night sweats, hemoptysis, or chest pain.  He has not had a chest imaging performed.  He takes diazepam intermittently for tremors and requests a refill.  Outpatient Medications Prior to Visit  Medication Sig Dispense Refill  . albuterol (PROVENTIL HFA;VENTOLIN HFA) 108 (90 BASE) MCG/ACT inhaler Inhale 2 puffs into the lungs every 6 (six) hours as needed for wheezing or shortness of breath. 3 Inhaler 4  . albuterol (PROVENTIL) (2.5 MG/3ML) 0.083% nebulizer solution USE 1 VIAL IN NEBULIZER 4 TIMES DAILY 75 mL 11  . amiodarone (PACERONE) 200 MG tablet TAKE 1 TABLET BY MOUTH EVERY DAY 90 tablet 1  . atorvastatin (LIPITOR) 80 MG tablet TAKE 1 TABLET BY MOUTH EVERY DAY 90 tablet 2  . ATROVENT HFA 17 MCG/ACT inhaler TAKE 2 PUFFS BY MOUTH EVERY 4 HOURS AS NEEDED FOR WHEEZE  12  . benzonatate (TESSALON) 100 MG capsule Take 2 capsules (200 mg total) by mouth 3 (three) times daily as needed for cough. 20 capsule 0  . carvedilol (COREG) 25 MG tablet Take 1 tablet (25 mg total) by mouth 2 (two) times daily with a meal. 180 tablet 1  . ENTRESTO 49-51 MG TAKE 1 TABLET BY MOUTH TWICE A DAY 60 tablet 6  . ezetimibe (ZETIA) 10 MG tablet Take 1 tablet (10 mg  total) by mouth daily. 90 tablet 3  . finasteride (PROSCAR) 5 MG tablet Take 5 mg by mouth daily.    . fluticasone (FLONASE) 50 MCG/ACT nasal spray Instill 1 spray in each  nostril daily 32 g 3  . Fluticasone-Umeclidin-Vilant (TRELEGY ELLIPTA) 100-62.5-25 MCG/INH AEPB Inhale 1 puff into the lungs daily. Rinse mouth 60 each 12  . isosorbide mononitrate (IMDUR) 60 MG 24 hr tablet Take 1.5 tablets (90 mg total) by mouth daily. 45 tablet 5  . KLOR-CON M20 20 MEQ tablet TAKE 1 TABLET BY MOUTH EVERY DAY 90 tablet 2  . levothyroxine (SYNTHROID) 50 MCG tablet TAKE 1 TABLET BY MOUTH  DAILY BEFORE BREAKFAST 90 tablet 0  . nitroGLYCERIN (NITROSTAT) 0.4 MG SL tablet DISSOLVE 1 TABLET UNDER THE TONGUE EVERY 5 MINUTES AS  NEEDED FOR CHEST PAIN , MAX 3 TABLETS IN 15 MIN, CALL  911 IF CHEST PAIN PERSISTS 25 tablet 0  . NON FORMULARY at bedtime. CPAP And during the day as needed    . predniSONE (DELTASONE) 20 MG tablet Take 2 tablets (40 mg total) by mouth daily with breakfast. 10 tablet 0  . rivaroxaban (XARELTO) 20 MG TABS tablet Take 1 tablet (20 mg total) by mouth daily with supper. 30 tablet 5  . spironolactone (ALDACTONE) 25 MG tablet TAKE 1 TABLET BY MOUTH  DAILY 90 tablet 3  . torsemide (  DEMADEX) 20 MG tablet Take 3 tablets (60 mg total) by mouth every morning AND 2 tablets (40 mg total) every evening. 150 tablet 5  . VASCEPA 1 g capsule TAKE 2 CAPSULES (2 G TOTAL) BY MOUTH 2 (TWO) TIMES A DAY. 120 capsule 11  . diazepam (VALIUM) 5 MG tablet 1-2 tabs every 8 hours if needed for tremor 30 tablet 5   No facility-administered medications prior to visit.    ROS Review of Systems  Constitutional: Negative.  Negative for appetite change, chills, diaphoresis, fatigue and fever.  HENT: Positive for congestion, postnasal drip, rhinorrhea, sinus pressure and sinus pain. Negative for ear pain, facial swelling, sore throat, trouble swallowing and voice change.   Respiratory: Positive for cough, shortness of  breath and wheezing. Negative for chest tightness.   Cardiovascular: Negative for chest pain, palpitations and leg swelling.  Gastrointestinal: Negative for abdominal pain, blood in stool, constipation, diarrhea, nausea and vomiting.  Genitourinary: Negative.  Negative for difficulty urinating.  Musculoskeletal: Negative for arthralgias and myalgias.  Skin: Negative.   Neurological: Positive for tremors. Negative for dizziness, weakness, light-headedness and numbness.  Hematological: Negative for adenopathy. Does not bruise/bleed easily.  Psychiatric/Behavioral: Negative.     Objective:  BP (!) 84/66 (BP Location: Left Arm, Patient Position: Sitting, Cuff Size: Large)   Pulse 70   Temp 99 F (37.2 C) (Oral)   Resp 20   Ht 5\' 8"  (1.727 m)   Wt 279 lb (126.6 kg)   SpO2 95%   BMI 42.42 kg/m   BP Readings from Last 3 Encounters:  03/23/20 (!) 84/66  03/19/20 (!) 106/52  01/20/20 118/84    Wt Readings from Last 3 Encounters:  03/23/20 279 lb (126.6 kg)  03/19/20 281 lb (127.5 kg)  01/20/20 291 lb (132 kg)    Physical Exam Vitals reviewed.  Constitutional:      General: He is not in acute distress.    Appearance: He is obese. He is not toxic-appearing or diaphoretic.  HENT:     Nose: Rhinorrhea present.     Right Nostril: No epistaxis.     Left Nostril: No epistaxis.     Right Sinus: Maxillary sinus tenderness present. No frontal sinus tenderness.     Left Sinus: Maxillary sinus tenderness present. No frontal sinus tenderness.     Mouth/Throat:     Mouth: Mucous membranes are moist.  Eyes:     General: No scleral icterus.    Conjunctiva/sclera: Conjunctivae normal.  Cardiovascular:     Rate and Rhythm: Normal rate and regular rhythm.     Heart sounds: S1 normal and S2 normal. Heart sounds are distant. No murmur. No gallop.   Pulmonary:     Effort: No tachypnea.     Breath sounds: No stridor or decreased air movement. Examination of the right-middle field reveals  rales. Examination of the left-middle field reveals rales. Examination of the right-lower field reveals rales. Examination of the left-lower field reveals rales. Rales present. No decreased breath sounds, wheezing or rhonchi.  Abdominal:     General: Abdomen is protuberant. Bowel sounds are normal. There is no distension.     Palpations: Abdomen is soft. There is no hepatomegaly, splenomegaly or mass.     Tenderness: There is no abdominal tenderness.  Musculoskeletal:     Cervical back: Neck supple.     Right lower leg: No edema.     Left lower leg: No edema.  Lymphadenopathy:     Cervical: No cervical adenopathy.  Neurological:     Mental Status: He is alert.     Motor: Weakness, tremor and atrophy present.     Lab Results  Component Value Date   WBC 7.2 03/19/2020   HGB 12.8 (L) 03/19/2020   HCT 41.2 03/19/2020   PLT 130 (L) 03/19/2020   GLUCOSE 127 (H) 03/19/2020   CHOL 94 09/09/2019   TRIG 147 09/09/2019   HDL 29 (L) 09/09/2019   LDLCALC 36 09/09/2019   ALT 18 01/06/2020   AST 21 01/06/2020   NA 140 03/19/2020   K 4.4 03/19/2020   CL 103 03/19/2020   CREATININE 1.63 (H) 03/19/2020   BUN 43 (H) 03/19/2020   CO2 26 03/19/2020   TSH 4.67 (H) 03/23/2020   PSA 0.51 04/18/2012   INR 1.01 12/28/2017   HGBA1C 6.4 03/23/2020    DG Chest 2 View  Result Date: 03/23/2020 CLINICAL DATA:  History of CHF, COPD, and lung cancer. No current chest complaints. EXAM: CHEST - 2 VIEW COMPARISON:  CT chest dated December 19, 2019. Chest x-ray dated June 03, 2019. FINDINGS: Unchanged left chest wall pacemaker. The heart remains borderline enlarged. Normal pulmonary vascularity. 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 Interval evolution of post treatment changes in the left upper lobe consistent with prior radiation. No focal consolidation, pleural effusion, or pneumothorax. No acute osseous abnormality. IMPRESSION: 1.  No active cardiopulmonary disease. 2. Post radiation  changes in both upper lobes. Electronically Signed   By: Titus Dubin M.D.   On: 03/23/2020 11:19    Assessment & Plan:   Dejay was seen today for hypertension, hypothyroidism and anemia.  Diagnoses and all orders for this visit:  Essential hypertension- His blood pressure is over controlled but he is tolerating it this well.  His antihypertensive meds are managed by cardiology.  Acquired hypothyroidism- His TSH is in the acceptable range.  He will remain on the current dose of levothyroxine. -     TSH; Future -     TSH  Other iron deficiency anemia- I have asked him to start taking an iron supplement. -     IBC panel; Future -     Ferritin; Future -     Ferritin -     IBC panel -     Iron-FA-B Cmp-C-Biot-Probiotic (FUSION PLUS) CAPS; Take 1 capsule by mouth daily.  Deficiency anemia- I will screen him for vitamin deficiencies. -     IBC panel; Future -     Vitamin B12; Future -     Folate; Future -     Ferritin; Future -     Vitamin B1; Future -     Vitamin B1 -     Ferritin -     Folate -     Vitamin B12 -     IBC panel  Hyperglycemia- His A1c is at 6.4%.  He is prediabetic.  Medical therapy is not indicated. -     Hemoglobin A1c; Future -     Hemoglobin A1c  Cough- His chest x-ray is negative for mass, infiltrate, or edema. -     DG Chest 2 View; Future  Stage 3a chronic kidney disease- He will avoid nephrotoxic agents.  Tremor, essential -     Discontinue: diazepam (VALIUM) 5 MG tablet; Take 1 tablet (5 mg total) by mouth every 8 (eight) hours as needed for anxiety. 1-2 tabs every 8 hours if needed for tremor -     diazepam (VALIUM) 5 MG  tablet; Take 1 tablet (5 mg total) by mouth every 8 (eight) hours as needed for anxiety. 1-2 tabs every 8 hours if needed for tremor  Need for pneumococcal vaccination -     Pneumococcal polysaccharide vaccine 23-valent greater than or equal to 2yo subcutaneous/IM  Acute recurrent maxillary sinusitis -      amoxicillin-clavulanate (AUGMENTIN) 875-125 MG tablet; Take 1 tablet by mouth 2 (two) times daily for 10 days.  Vitamin B12 deficiency anemia due to intrinsic factor deficiency- I recommended that he start receiving parenteral B12 replacement therapy.   I am having [name] start on amoxicillin-clavulanate and Fusion Plus. I am also having him maintain his NON FORMULARY, albuterol, fluticasone, Atrovent HFA, Klor-Con M20, atorvastatin, finasteride, nitroGLYCERIN, spironolactone, albuterol, carvedilol, Trelegy Ellipta, Entresto, rivaroxaban, amiodarone, torsemide, Vascepa, levothyroxine, predniSONE, benzonatate, isosorbide mononitrate, ezetimibe, and diazepam.  Meds ordered this encounter  Medications  . DISCONTD: diazepam (VALIUM) 5 MG tablet    Sig: Take 1 tablet (5 mg total) by mouth every 8 (eight) hours as needed for anxiety. 1-2 tabs every 8 hours if needed for tremor    Dispense:  30 tablet    Refill:  5  . diazepam (VALIUM) 5 MG tablet    Sig: Take 1 tablet (5 mg total) by mouth every 8 (eight) hours as needed for anxiety. 1-2 tabs every 8 hours if needed for tremor    Dispense:  30 tablet    Refill:  5  . amoxicillin-clavulanate (AUGMENTIN) 875-125 MG tablet    Sig: Take 1 tablet by mouth 2 (two) times daily for 10 days.    Dispense:  20 tablet    Refill:  0  . Iron-FA-B Cmp-C-Biot-Probiotic (FUSION PLUS) CAPS    Sig: Take 1 capsule by mouth daily.    Dispense:  90 capsule    Refill:  1   I spent 50 minutes in preparing to see the patient by review of recent labs, imaging and procedures, obtaining and reviewing separately obtained history, communicating with the patient and family or caregiver, ordering medications, tests or procedures, and documenting clinical information in the EHR including the differential Dx, treatment, and any further evaluation and other management of 1. Essential hypertension 2. Acquired hypothyroidism 3. Other iron deficiency anemia 4. Deficiency  anemia 5. Hyperglycemia 6. Cough 7. Stage 3a chronic kidney disease 8. Tremor, essential 9. Acute recurrent maxillary sinusitis 10. Vitamin B12 deficiency anemia due to intrinsic factor deficiency   Follow-up: Return in about 3 months (around 06/22/2020).  [name], MD

## 2020-03-31 ENCOUNTER — Other Ambulatory Visit: Payer: Medicare Other

## 2020-03-31 ENCOUNTER — Other Ambulatory Visit: Payer: Self-pay

## 2020-03-31 ENCOUNTER — Ambulatory Visit (INDEPENDENT_AMBULATORY_CARE_PROVIDER_SITE_OTHER): Payer: Medicare Other | Admitting: *Deleted

## 2020-03-31 DIAGNOSIS — E538 Deficiency of other specified B group vitamins: Secondary | ICD-10-CM | POA: Diagnosis not present

## 2020-03-31 DIAGNOSIS — D539 Nutritional anemia, unspecified: Secondary | ICD-10-CM

## 2020-03-31 MED ORDER — CYANOCOBALAMIN 1000 MCG/ML IJ SOLN
1000.0000 ug | Freq: Once | INTRAMUSCULAR | Status: AC
  Administered 2020-03-31: 1000 ug via INTRAMUSCULAR

## 2020-03-31 NOTE — Progress Notes (Addendum)
Pls cosign for B12 inj../lmb I have reviewed and agree  

## 2020-04-02 NOTE — Progress Notes (Signed)
Electrophysiology Office Note Date: 04/05/2020  ID:  Tyrone, Small Sep 08, 1946, MRN [id number]  PCP: [name], MD Primary Cardiologist: No primary care provider on file. Electrophysiologist: [name]  CC: Routine ICD follow-up  [name] is a 74 y.o. male seen today for [name] for routine electrophysiology followup.  Since last being seen in our clinic the patient reports doing well. No further ICD therapies. He continues to have intermittent tachypalpitations, but overall AF burden low. Does have accompanying angina at times with rapid HRs. Takes NTG at least once weekly. Dr. Aundra Small following for possible LHC if worsens. He denies PND, orthopnea, nausea, vomiting, dizziness, syncope, edema, weight gain, or early satiety. He has not had ICD shocks since last visit.   Device History: [employer] BiV ICD implanted 2006, gen change 2011, upgrade 2015 for Chronic systolic CHF History of appropriate therapy: Yes History of AAD therapy: Yes; currently on amiodarone   Past Medical History:  Diagnosis Date  . AAA (abdominal aortic aneurysm) ([place])    followed by Dr. Bridgett Small  . Adenomatous colon polyp 01/2004  . Anemia    hx  . Automatic implantable cardioverter-defibrillator in situ   . AVM (arteriovenous malformation)   . BPH (benign prostatic hypertrophy)   . CAD (coronary artery disease)    s/p CABGx2 in 1996  . Carotid artery stenosis    LCEA - Dr. Bridgett Small in 2013  . CHF (congestive heart failure) ([place])   . Complication of anesthesia    claustrophobic, unabe to lie on back more than 4 hours at time due to back  . COPD (chronic obstructive pulmonary disease) ([place])   . Diabetes mellitus    DIET CONTROLLED- pt states that this was a misdiagnosis, he was treated while in the hospital  but returned home, loss a massive amount of weight and he has not had a problem with his blood sugar since. States everything has been normal for about 3 years.  . Diverticulosis    . Dyspnea   . Dysrhythmia    ICD-defibrillator  . Fatigue   . GERD (gastroesophageal reflux disease)   . H/O hiatal hernia   . History of radiation therapy 04/09/17-04/17/17   SBRT right lung 54 Gy in 3 fractions  . HLD (hyperlipidemia)   . Hypertension   . Hypothyroidism   . Ischemic cardiomyopathy   . Myocardial infarction ([place])   . 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 OSA on CPAP    AHI durign total sleep 14.69/hr, during REM 50.91/hr  . Peripheral vascular disease ([facility])    LCEA, L renal artery stent, bilat iliac stents, R SFA stenosis  . Tremor   . Ventricular tachycardia ([place]) 09/09/2014   Amiodarone was started after appropriate defibrillator shocks for ventricular tachycardia in October 2008   Past Surgical History:  Procedure Laterality Date  . ABDOMINAL AORTIC ENDOVASCULAR STENT GRAFT [name] 01/06/2014   Procedure: ABDOMINAL AORTIC ENDOVASCULAR STENT GRAFT- GORE; ULTRASOUND GUIDED;  Surgeon: Tyrone Vidor, MD;  Location: [place];  Service: Vascular;  Laterality: [name];  . ANGIOPLASTY     BILATERAL  Small  W/STENTS  . BI-VENTRICULAR IMPLANTABLE CARDIOVERTER DEFIBRILLATOR UPGRADE [name] 10/09/2014   Procedure: BI-VENTRICULAR IMPLANTABLE CARDIOVERTER DEFIBRILLATOR UPGRADE;  Surgeon: Tyrone Lance, MD;  Location[facility] C[facility] LAB;  Service: Cardiovascular;  Laterality: [name];  . BIV ICD GENERTAOR CHANGE OUT  10/09/2014   UPGRADE TO BIV        BY DR Lovena Small  .  CARDIAC CATHETERIZATION  09/16/2007   occlusion of both vein grafts, no significant LAD disease or diagonal disease, Cfx collaterals from the left, 70% in-stent restenosis of L renal artery, normal L main, RCA occluded ostially ([name])  . CARDIAC CATHETERIZATION  10/03/2002   SVG sequentially to OM1 & OM2 totally occluded at ostium, SVG to PDA totally occluded within previously placed prox vein graft stent, smal distal AAA, bialt iliac stents with 30% left end-stent restenosis and 50% right end-stent restnosis([name])  . CARDIAC  CATHETERIZATION  06/11/1998   L main with 20% narrowing in distal 1/3; LAD with 1st diagonla having 70% ostial narrowing, 2nd diagonal with 40% narrowing in prox third, Small & RIMA widely patent; in-stent restenosis of RCIA with successful PTA and new prox SVTRCA stent for residual disease ([name])  . CARDIAC DEFIBRILLATOR PLACEMENT  06/2005   Guidant Vitality HE - ischemic cardiomyopathy - [name]  . CAROTID ENDARTERECTOMY Left 01/04/12  . CORONARY ANGIOPLASTY  01/08/2004   cutting balloon atherectomy & percutaneous intervention of RCIA in-stent restenosis ([name])  . CORONARY ARTERY BYPASS GRAFT  11/04/1985   x2 - PDA and sequential DX-OM (Dr. Redmond Pulling)  . ENDARTERECTOMY  01/04/2012   Procedure: ENDARTERECTOMY CAROTID;left  Surgeon: Tyrone Lenis, MD;  Location: [place];  Service: Vascular;  Laterality: Left;  with patch angioplasty  . FUDUCIAL PLACEMENT [name] 02/23/2017   Procedure: PLACEMENT OF FUDUCIAL;  Surgeon: Tyrone Nakayama, MD;  Location: [place];  Service: Thoracic;  Laterality: [name];  . ICD GENERATOR CHANGE  05/02/2010   [employer], retail  . ILIAC ARTERY STENT Bilateral 03/1997   and L SFA PTA  . Iron infusion  June 16, 2012  . LEFT HEART CATHETERIZATION WITH CORONARY ANGIOGRAM [name] 07/06/2014   Procedure: LEFT HEART CATHETERIZATION WITH CORONARY ANGIOGRAM;  Surgeon: Tyrone M Martinique, MD;  Location: [facility] CATH LAB;  Service: Cardiovascular;  Laterality: [name];  . NM MYOCAR PERF WALL MOTION  09/2012   lexiscan myoview - mod-severe perfusion defect r/t infarct or scar w/mild periinfarct ishcemia in basal inferior, mid inferior, apical inferior, basal inferolateral & mid inferoalteral regions - EF 21% low risk scan  . POLYPECTOMY    . RENAL ARTERY STENT Left 03/24/2004   6x30mm Genesis stent ([name])  . RIGHT/LEFT HEART CATH AND CORONARY ANGIOGRAPHY [name] 12/28/2017   Procedure: RIGHT/LEFT HEART CATH AND CORONARY ANGIOGRAPHY;  Surgeon: Tyrone Dresser,  MD;  Location: [place] CV LAB;  Service: Cardiovascular;  Laterality: [name];  . TRANSTHORACIC ECHOCARDIOGRAM  12/2012   EF 30-35; LV mod to severely dilated, mod concentric hypertrophy, severe hypokinesis of inferolateral myocardium, moderate hypokineis of anteroseptal region, grade 1 diastolic dysfunction; mod MR; LA mod-severely dialted; RV mod dialted; RA mildly dilated; PA peak pressure 52mmHg  . VIDEO BRONCHOSCOPY WITH ENDOBRONCHIAL NAVIGATION [name] 02/23/2017   Procedure: VIDEO BRONCHOSCOPY WITH ENDOBRONCHIAL NAVIGATION;  Surgeon: Tyrone Nakayama, MD;  Location: [place];  Service: Thoracic;  Laterality: [name];    Current Outpatient Medications  Medication Sig Dispense Refill  . albuterol (PROVENTIL HFA;VENTOLIN HFA) 108 (90 BASE) MCG/ACT inhaler Inhale 2 puffs into the lungs every 6 (six) hours as needed for wheezing or shortness of breath. 3 Inhaler 4  . albuterol (PROVENTIL) (2.5 MG/3ML) 0.083% nebulizer solution USE 1 VIAL IN NEBULIZER 4 TIMES DAILY 75 mL 11  . amiodarone (PACERONE) 200 MG tablet TAKE 1 TABLET BY MOUTH EVERY DAY 90 tablet 1  . atorvastatin (LIPITOR) 80  MG tablet TAKE 1 TABLET BY MOUTH EVERY DAY 90 tablet 2  . ATROVENT HFA 17 MCG/ACT inhaler TAKE 2 PUFFS BY MOUTH EVERY 4 HOURS AS NEEDED FOR WHEEZE  12  . carvedilol (COREG) 25 MG tablet Take 1 tablet (25 mg total) by mouth 2 (two) times daily with a meal. 180 tablet 1  . diazepam (VALIUM) 5 MG tablet Take 1 tablet (5 mg total) by mouth every 8 (eight) hours as needed for anxiety. 1-2 tabs every 8 hours if needed for tremor 30 tablet 5  . ENTRESTO 49-51 MG TAKE 1 TABLET BY MOUTH TWICE A DAY 60 tablet 6  . ezetimibe (ZETIA) 10 MG tablet Take 1 tablet (10 mg total) by mouth daily. 90 tablet 3  . finasteride (PROSCAR) 5 MG tablet Take 5 mg by mouth daily.    . fluticasone (FLONASE) 50 MCG/ACT nasal spray Instill 1 spray in each  nostril daily 32 g 3  . Fluticasone-Umeclidin-Vilant (TRELEGY ELLIPTA) 100-62.5-25 MCG/INH AEPB  Inhale 1 puff into the lungs daily. Rinse mouth 60 each 12  . Iron-FA-B Cmp-C-Biot-Probiotic (FUSION PLUS) CAPS Take 1 capsule by mouth daily. 90 capsule 1  . isosorbide mononitrate (IMDUR) 60 MG 24 hr tablet Take 1.5 tablets (90 mg total) by mouth daily. 45 tablet 5  . KLOR-CON M20 20 MEQ tablet TAKE 1 TABLET BY MOUTH EVERY DAY 90 tablet 2  . levothyroxine (SYNTHROID) 50 MCG tablet TAKE 1 TABLET BY MOUTH  DAILY BEFORE BREAKFAST 90 tablet 0  . nitroGLYCERIN (NITROSTAT) 0.4 MG SL tablet DISSOLVE 1 TABLET UNDER THE TONGUE EVERY 5 MINUTES AS  NEEDED FOR CHEST PAIN , MAX 3 TABLETS IN 15 MIN, CALL  911 IF CHEST PAIN PERSISTS 25 tablet 0  . NON FORMULARY at bedtime. CPAP And during the day as needed    . rivaroxaban (XARELTO) 20 MG TABS tablet Take 1 tablet (20 mg total) by mouth daily with supper. 30 tablet 5  . spironolactone (ALDACTONE) 25 MG tablet TAKE 1 TABLET BY MOUTH  DAILY 90 tablet 3  . thiamine 100 MG tablet Take 1 tablet (100 mg total) by mouth every other day. 45 tablet 0  . torsemide (DEMADEX) 20 MG tablet Take 3 tablets (60 mg total) by mouth every morning AND 2 tablets (40 mg total) every evening. 150 tablet 5  . VASCEPA 1 g capsule TAKE 2 CAPSULES (2 G TOTAL) BY MOUTH 2 (TWO) TIMES A DAY. 120 capsule 11   No current facility-administered medications for this visit.    Allergies:   Tussionex pennkinetic er [hydrocod polst-cpm polst er] and Ace inhibitors   Social History: Social History   Socioeconomic History  . Marital status: Married    Spouse name: Not on file  . Number of children: 2  . Years of education: Not on file  . Highest education level: Not on file  Occupational History  . Occupation: retired Solicitor   Tobacco Use  . Smoking status: Former Smoker    Packs/day: 1.50    Years: 40.00    Pack years: 60.00    Types: Cigarettes, Pipe    Quit date: 12/11/2002    Years since quitting: 17.3  . Smokeless tobacco: Never Used  . Tobacco comment: Pt  reports smoking 1 pack/day before quitting in 2004  Substance and Sexual Activity  . Alcohol use: No    Alcohol/week: 0.0 standard drinks  . Drug use: No  . Sexual activity: Not Currently  Other Topics Concern  .  Not on file  Social History Narrative  . Not on file   Social Determinants of Health   Financial Resource Strain:   . Difficulty of Paying Living Expenses:   Food Insecurity:   . Worried About Charity fundraiser in the Last Year:   . Arboriculturist in the Last Year:   Transportation Needs:   . Film/video editor (Medical):   Marland Kitchen Lack of Transportation (Non-Medical):   Physical Activity:   . Days of Exercise per Week:   . Minutes of Exercise per Session:   Stress:   . Feeling of Stress :   Social Connections:   . Frequency of Communication with Friends and Family:   . Frequency of Social Gatherings with Friends and Family:   . Attends Religious Services:   . Active Member of Clubs or Organizations:   . Attends Archivist Meetings:   Marland Kitchen Marital Status:   Intimate Partner Violence:   . Fear of Current or Ex-Partner:   . Emotionally Abused:   Marland Kitchen Physically Abused:   . Sexually Abused:     Family History: Family History  Problem Relation Age of Onset  . Lung cancer Mother   . Cancer Mother   . Diabetes Mother   . Hypertension Mother   . Hyperlipidemia Mother   . Heart disease Mother        before age 48  . Heart attack Father   . Heart disease Father        before age 42  . Hypertension Father   . Colon cancer Sister   . Cancer Sister   . Hypertension Sister   . Hyperlipidemia Sister   . Parkinson's disease Sister   . Diabetes Sister   . Heart disease Sister        before age 33  . Heart attack Maternal Grandmother   . Diabetes Daughter     Review of Systems: All other systems reviewed and are otherwise negative except as noted above.   Physical Exam: Vitals:   04/05/20 1045  BP: 90/60  Pulse: 70  SpO2: 97%  Weight: 273 lb  3.2 oz (123.9 kg)  Height: 5\' 8"  (1.727 m)     GEN- The patient is well appearing, alert and oriented x 3 today.   HEENT: normocephalic, atraumatic; sclera clear, conjunctiva pink; hearing intact; oropharynx clear; neck supple, no JVP Lymph- no cervical lymphadenopathy Lungs- Clear to ausculation bilaterally, normal work of breathing.  No wheezes, rales, rhonchi Heart- Regular rate and rhythm, no murmurs, rubs or gallops, PMI not laterally displaced GI- soft, non-tender, non-distended, bowel sounds present, no hepatosplenomegaly Extremities- no clubbing, cyanosis, or edema; DP/PT/radial pulses 2+ bilaterally MS- no significant deformity or atrophy Skin- warm and dry, no rash or lesion; ICD pocket well healed Psych- euthymic mood, full affect Neuro- strength and sensation are intact  ICD interrogation- reviewed in detail today,  See PACEART report  EKG:  EKG is not ordered today.  Recent Labs: 01/06/2020: ALT 18 02/27/2020: Magnesium 2.3 03/19/2020: BUN 43; Creatinine, Ser 1.63; Hemoglobin 12.8; Platelets 130; Potassium 4.4; Sodium 140 03/23/2020: TSH 4.67   Wt Readings from Last 3 Encounters:  04/05/20 273 lb 3.2 oz (123.9 kg)  03/23/20 279 lb (126.6 kg)  03/19/20 281 lb (127.5 kg)     Other studies Reviewed: Additional studies/ records that were reviewed today include:  Previous EP and CHF office notes, most recent ECho, most recent labs, Previous remote reports  Assessment and Plan:  1.  Chronic systolic dysfunction s/p Chemical engineer CRT-D  euvolemic today Stable on an appropriate medical regimen followed by AHF clinic Normal ICD function See Pace Art report No changes today  2. Paroxysmal atrial fibrillation Burden <1% Continue Xarelto for CHA2DS2VASC of at least 6   Rates do get up into 130-140s when he has, but only short episodes seen recently.   3. CAD s/p CABG LHC 12/2017 stable anatomy Does have angina with AF RVR runs Dr. Aundra Small following for worsening and  LHC consideration.  Imdur recently increased.   Current medicines are reviewed at length with the patient today.   The patient does not have concerns regarding his medicines.  The following changes were made today:  none  Labs/ tests ordered today include:  No orders of the defined types were placed in this encounter.   Disposition:   Follow up with [name] in 6 months. Sooner with issues. Next remote check 05/2020.   Jacalyn Lefevre, PA-C  04/05/2020 10:53 AM  [facility] [address] [place] [address] [phone] (office) [phone] (fax)

## 2020-04-03 ENCOUNTER — Other Ambulatory Visit: Payer: Self-pay | Admitting: Internal Medicine

## 2020-04-03 ENCOUNTER — Encounter: Payer: Self-pay | Admitting: Internal Medicine

## 2020-04-03 DIAGNOSIS — E519 Thiamine deficiency, unspecified: Secondary | ICD-10-CM

## 2020-04-03 LAB — VITAMIN B1: Vitamin B1 (Thiamine): <6 nmol/L — ABNORMAL LOW (ref 8–30)

## 2020-04-03 MED ORDER — THIAMINE HCL 100 MG PO TABS: 100.0000 mg | ORAL_TABLET | ORAL | 0 refills | Status: DC

## 2020-04-05 ENCOUNTER — Encounter: Payer: Self-pay | Admitting: Student

## 2020-04-05 ENCOUNTER — Ambulatory Visit (INDEPENDENT_AMBULATORY_CARE_PROVIDER_SITE_OTHER): Payer: Medicare Other | Admitting: Student

## 2020-04-05 ENCOUNTER — Other Ambulatory Visit: Payer: Self-pay

## 2020-04-05 VITALS — BP 90/60 | HR 70 | Ht 68.0 in | Wt 273.2 lb

## 2020-04-05 DIAGNOSIS — I493 Ventricular premature depolarization: Secondary | ICD-10-CM | POA: Diagnosis not present

## 2020-04-05 DIAGNOSIS — I2510 Atherosclerotic heart disease of native coronary artery without angina pectoris: Secondary | ICD-10-CM | POA: Diagnosis not present

## 2020-04-05 DIAGNOSIS — I4819 Other persistent atrial fibrillation: Secondary | ICD-10-CM

## 2020-04-05 DIAGNOSIS — I5042 Chronic combined systolic (congestive) and diastolic (congestive) heart failure: Secondary | ICD-10-CM

## 2020-04-05 LAB — CUP PACEART INCLINIC DEVICE CHECK
Date Time Interrogation Session: 20210426111232
HighPow Impedance: 38 Ohm
HighPow Impedance: 46 Ohm
Implantable Lead Implant Date: 20060411
Implantable Lead Implant Date: 20151030
Implantable Lead Implant Date: 20151030
Implantable Lead Location: 753858
Implantable Lead Location: 753859
Implantable Lead Location: 753860
Implantable Lead Model: 148
Implantable Lead Model: 4136
Implantable Lead Serial Number: 145070
Implantable Lead Serial Number: 29713415
Implantable Pulse Generator Implant Date: 20151030
Lead Channel Impedance Value: 524 Ohm
Lead Channel Impedance Value: 577 Ohm
Lead Channel Impedance Value: 681 Ohm
Lead Channel Pacing Threshold Amplitude: 0.5 V
Lead Channel Pacing Threshold Amplitude: 0.7 V
Lead Channel Pacing Threshold Amplitude: 1.4 V
Lead Channel Pacing Threshold Pulse Width: 0.4 ms
Lead Channel Pacing Threshold Pulse Width: 0.4 ms
Lead Channel Pacing Threshold Pulse Width: 0.9 ms
Lead Channel Sensing Intrinsic Amplitude: 11.6 mV
Lead Channel Sensing Intrinsic Amplitude: 21.4 mV
Lead Channel Sensing Intrinsic Amplitude: 3.3 mV
Lead Channel Setting Pacing Amplitude: 2 V
Lead Channel Setting Pacing Amplitude: 2.2 V
Lead Channel Setting Pacing Amplitude: 3.5 V
Lead Channel Setting Pacing Pulse Width: 0.4 ms
Lead Channel Setting Pacing Pulse Width: 0.9 ms
Lead Channel Setting Sensing Sensitivity: 0.6 mV
Lead Channel Setting Sensing Sensitivity: 1 mV
Pulse Gen Serial Number: 370874

## 2020-04-05 MED ORDER — NITROGLYCERIN 0.4 MG SL SUBL: 0.4000 mg | SUBLINGUAL_TABLET | SUBLINGUAL | 3 refills | Status: DC | PRN

## 2020-04-05 NOTE — Patient Instructions (Addendum)
Medication Instructions:  none *If you need a refill on your cardiac medications before your next appointment, please call your pharmacy*   Lab Work: none If you have labs (blood work) drawn today and your tests are completely normal, you will receive your results only by: Marland Kitchen MyChart Message (if you have MyChart) OR . A paper copy in the mail If you have any lab test that is abnormal or we need to change your treatment, we will call you to review the results.   Testing/Procedures: none   Follow-Up: At [facility], you and your health needs are our priority.  As part of our continuing mission to provide you with exceptional heart care, we have created designated Provider Care Teams.  These Care Teams include your primary Cardiologist (physician) and Advanced Practice Providers (APPs -  Physician Assistants and Nurse Practitioners) who all work together to provide you with the care you need, when you need it.  Your next appointment:   6 months  The format for your next appointment:   Either In Person or Virtual  Provider:   Dr Lovena Le   Other Instructions Remote monitoring is used to monitor your ICD from home. This monitoring reduces the number of office visits required to check your device to one time per year. It allows [place] to keep an eye on the functioning of your device to ensure it is working properly. You are scheduled for a device check from home on 05/21/20. You may send your transmission at any time that day. If you have a wireless device, the transmission will be sent automatically. After your physician reviews your transmission, you will receive a postcard with your next transmission date.

## 2020-04-19 ENCOUNTER — Telehealth (INDEPENDENT_AMBULATORY_CARE_PROVIDER_SITE_OTHER): Payer: Medicare Other | Admitting: Family

## 2020-04-19 DIAGNOSIS — J440 Chronic obstructive pulmonary disease with acute lower respiratory infection: Secondary | ICD-10-CM

## 2020-04-19 DIAGNOSIS — J209 Acute bronchitis, unspecified: Secondary | ICD-10-CM | POA: Diagnosis not present

## 2020-04-19 DIAGNOSIS — R05 Cough: Secondary | ICD-10-CM | POA: Diagnosis not present

## 2020-04-19 DIAGNOSIS — R059 Cough, unspecified: Secondary | ICD-10-CM

## 2020-04-19 MED ORDER — BENZONATATE 200 MG PO CAPS: 200.0000 mg | ORAL_CAPSULE | Freq: Three times a day (TID) | ORAL | 0 refills | Status: DC | PRN

## 2020-04-19 MED ORDER — DOXYCYCLINE HYCLATE 100 MG PO TABS: 100.0000 mg | ORAL_TABLET | Freq: Two times a day (BID) | ORAL | 0 refills | Status: DC

## 2020-04-19 NOTE — Progress Notes (Signed)
[name] is a 74 y.o. male with the following history as recorded in EpicCare:  Patient Active Problem List   Diagnosis Date Noted  . Thiamine deficiency 04/03/2020  . Deficiency anemia 03/23/2020  . Cough 03/23/2020  . Stage 3a chronic kidney disease 03/23/2020  . Need for pneumococcal vaccination 03/23/2020  . Tremor, essential 03/23/2020  . Acute recurrent maxillary sinusitis 03/23/2020  . Vitamin B12 deficiency anemia due to intrinsic factor deficiency 03/23/2020  . Chronic respiratory failure with hypoxia ([place]) 12/01/2018  . Atherosclerosis of native arteries of extremity with intermittent claudication ([place]) 02/06/2018  . Carotid artery disease ([place]) 10/08/2015  . Carotid artery occlusion with infarction ([place]) 10/08/2015  . AAA (abdominal aortic aneurysm) without rupture ([place]) 04/02/2015  . Iron deficiency anemia 02/23/2015  . Hyperglycemia 02/17/2015  . Morbid obesity ([place]) 05/27/2014  . Chronic combined systolic and diastolic CHF, NYHA class 3 ([place]) 03/02/2014  . Diastolic dysfunction- grade 2 03/02/2014  . CAD -SVG-RCA PCI '99, CFX PCI '99. Cath '05 and '08 - medical Rx 03/02/2014  . Biventricular ICD (implantable cardioverter-defibrillator) in place 02/24/2014  . Cardiomyopathy, ischemic- EF 20-25% 2D 02/25/14 11/24/2013  . PAD (peripheral artery disease) ([place]) 11/24/2013  . Hyperlipidemia with target LDL less than 70 06/18/2013  . OSA on CPAP 04/18/2012  . Hypothyroidism 04/18/2012  . COPD mixed type ([place]) 10/14/2010  . Essential hypertension 11/22/2009  . HYPERTROPHY PROSTATE W/UR OBST & OTH LUTS 11/22/2009  . GERD 03/23/2009    Current Outpatient Medications  Medication Sig Dispense Refill  . albuterol (PROVENTIL HFA;VENTOLIN HFA) 108 (90 BASE) MCG/ACT inhaler Inhale 2 puffs into the lungs every 6 (six) hours as needed for wheezing or shortness of breath. 3 Inhaler 4  . albuterol (PROVENTIL) (2.5 MG/3ML) 0.083% nebulizer solution USE 1 VIAL IN NEBULIZER 4 TIMES  DAILY 75 mL 11  . amiodarone (PACERONE) 200 MG tablet TAKE 1 TABLET BY MOUTH EVERY DAY 90 tablet 1  . atorvastatin (LIPITOR) 80 MG tablet TAKE 1 TABLET BY MOUTH EVERY DAY 90 tablet 2  . ATROVENT HFA 17 MCG/ACT inhaler TAKE 2 PUFFS BY MOUTH EVERY 4 HOURS AS NEEDED FOR WHEEZE  12  . benzonatate (TESSALON) 200 MG capsule Take 1 capsule (200 mg total) by mouth 3 (three) times daily as needed for cough. 60 capsule 0  . carvedilol (COREG) 25 MG tablet Take 1 tablet (25 mg total) by mouth 2 (two) times daily with a meal. 180 tablet 1  . diazepam (VALIUM) 5 MG tablet Take 1 tablet (5 mg total) by mouth every 8 (eight) hours as needed for anxiety. 1-2 tabs every 8 hours if needed for tremor 30 tablet 5  . doxycycline (VIBRA-TABS) 100 MG tablet Take 1 tablet (100 mg total) by mouth 2 (two) times daily. 28 tablet 0  . ENTRESTO 49-51 MG TAKE 1 TABLET BY MOUTH TWICE A DAY 60 tablet 6  . ezetimibe (ZETIA) 10 MG tablet Take 1 tablet (10 mg total) by mouth daily. 90 tablet 3  . finasteride (PROSCAR) 5 MG tablet Take 5 mg by mouth daily.    . fluticasone (FLONASE) 50 MCG/ACT nasal spray Instill 1 spray in each  nostril daily 32 g 3  . Fluticasone-Umeclidin-Vilant (TRELEGY ELLIPTA) 100-62.5-25 MCG/INH AEPB Inhale 1 puff into the lungs daily. Rinse mouth 60 each 12  . Iron-FA-B Cmp-C-Biot-Probiotic (FUSION PLUS) CAPS Take 1 capsule by mouth daily. 90 capsule 1  . isosorbide mononitrate (IMDUR) 60 MG 24 hr tablet Take 1.5 tablets (90 mg total)  by mouth daily. 45 tablet 5  . KLOR-CON M20 20 MEQ tablet TAKE 1 TABLET BY MOUTH EVERY DAY 90 tablet 2  . levothyroxine (SYNTHROID) 50 MCG tablet TAKE 1 TABLET BY MOUTH  DAILY BEFORE BREAKFAST 90 tablet 0  . nitroGLYCERIN (NITROSTAT) 0.4 MG SL tablet Place 1 tablet (0.4 mg total) under the tongue every 5 (five) minutes as needed for chest pain. 25 tablet 3  . NON FORMULARY at bedtime. CPAP And during the day as needed    . rivaroxaban (XARELTO) 20 MG TABS tablet Take 1 tablet  (20 mg total) by mouth daily with supper. 30 tablet 5  . spironolactone (ALDACTONE) 25 MG tablet TAKE 1 TABLET BY MOUTH  DAILY 90 tablet 3  . thiamine 100 MG tablet Take 1 tablet (100 mg total) by mouth every other day. 45 tablet 0  . torsemide (DEMADEX) 20 MG tablet Take 3 tablets (60 mg total) by mouth every morning AND 2 tablets (40 mg total) every evening. 150 tablet 5  . VASCEPA 1 g capsule TAKE 2 CAPSULES (2 G TOTAL) BY MOUTH 2 (TWO) TIMES A DAY. 120 capsule 11   No current facility-administered medications for this visit.    Allergies: Tussionex pennkinetic er [hydrocod polst-cpm polst er] and Ace inhibitors  Past Medical History:  Diagnosis Date  . AAA (abdominal aortic aneurysm) ([place])    followed by Dr. Bridgett Larsson  . Adenomatous colon polyp 01/2004  . Anemia    hx  . Automatic implantable cardioverter-defibrillator in situ   . AVM (arteriovenous malformation)   . BPH (benign prostatic hypertrophy)   . CAD (coronary artery disease)    s/p CABGx2 in 1996  . Carotid artery stenosis    LCEA - Dr. Bridgett Larsson in 2013  . CHF (congestive heart failure) ([place])   . Complication of anesthesia    claustrophobic, unabe to lie on back more than 4 hours at time due to back  . COPD (chronic obstructive pulmonary disease) ([place])   . Diabetes mellitus    DIET CONTROLLED- pt states that this was a misdiagnosis, he was treated while in the hospital  but returned home, loss a massive amount of weight and he has not had a problem with his blood sugar since. States everything has been normal for about 3 years.  . Diverticulosis   . Dyspnea   . Dysrhythmia    ICD-defibrillator  . Fatigue   . GERD (gastroesophageal reflux disease)   . H/O hiatal hernia   . History of radiation therapy 04/09/17-04/17/17   SBRT right lung 54 Gy in 3 fractions  . HLD (hyperlipidemia)   . Hypertension   . Hypothyroidism   . Ischemic cardiomyopathy   . Myocardial infarction ([place])   . 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 OSA on  CPAP    AHI durign total sleep 14.69/hr, during REM 50.91/hr  . Peripheral vascular disease ([facility])    LCEA, L renal artery stent, bilat iliac stents, R SFA stenosis  . Tremor   . Ventricular tachycardia ([place]) 09/09/2014   Amiodarone was started after appropriate defibrillator shocks for ventricular tachycardia in October 2008    Past Surgical History:  Procedure Laterality Date  . ABDOMINAL AORTIC ENDOVASCULAR STENT GRAFT [name] 01/06/2014   Procedure: ABDOMINAL AORTIC ENDOVASCULAR STENT GRAFT- GORE; ULTRASOUND GUIDED;  Surgeon: Conrad [place], MD;  Location: [facility];  Service: Vascular;  Laterality: [name];  . ANGIOPLASTY     BILATERAL  LE  W/STENTS  .  BI-VENTRICULAR IMPLANTABLE CARDIOVERTER DEFIBRILLATOR UPGRADE [name] 10/09/2014   Procedure: BI-VENTRICULAR IMPLANTABLE CARDIOVERTER DEFIBRILLATOR UPGRADE;  Surgeon: Evans Lance, MD;  Location: [facility] CATH LAB;  Service: Cardiovascular;  Laterality: [name];  . BIV ICD GENERTAOR CHANGE OUT  10/09/2014   UPGRADE TO BIV        BY DR Lovena Le  . 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 Adora Fridge)  . CARDIAC CATHETERIZATION  10/03/2002   SVG sequentially to OM1 & OM2 totally occluded at ostium, SVG to PDA totally occluded within previously placed prox vein graft stent, smal distal AAA, bialt iliac stents with 30% left end-stent restenosis and 50% right end-stent restnosis(Dr. Gerrie Nordmann)  . CARDIAC CATHETERIZATION  06/11/1998   L main with 20% narrowing in distal 1/3; LAD with 1st diagonla having 70% ostial narrowing, 2nd diagonal with 40% narrowing in prox third, LIMA & RIMA widely patent; in-stent restenosis of RCIA with successful PTA and new prox SVTRCA stent for residual disease (Dr. Marella Chimes)  . CARDIAC DEFIBRILLATOR PLACEMENT  06/2005   Guidant Vitality HE - ischemic  cardiomyopathy - Dr. Marella Chimes  . CAROTID ENDARTERECTOMY Left 01/04/12  . CORONARY ANGIOPLASTY  01/08/2004   cutting balloon atherectomy & percutaneous intervention of RCIA in-stent restenosis (Dr. Marella Chimes)  . CORONARY ARTERY BYPASS GRAFT  11/04/1985   x2 - PDA and sequential DX-OM (Dr. Redmond Pulling)  . ENDARTERECTOMY  01/04/2012   Procedure: ENDARTERECTOMY CAROTID;left  Surgeon: Hinda Lenis, MD;  Location: [facility];  Service: Vascular;  Laterality: Left;  with patch angioplasty  . FUDUCIAL PLACEMENT [name] 02/23/2017   Procedure: PLACEMENT OF FUDUCIAL;  Surgeon: [name], MD;  Location: [place];  Service: Thoracic;  Laterality: [name];  . ICD GENERATOR CHANGE  05/02/2010   [employer], retail  . ILIAC ARTERY STENT Bilateral 03/1997   and L SFA PTA  . Iron infusion  June 16, 2012  . LEFT HEART CATHETERIZATION WITH CORONARY ANGIOGRAM [name] 07/06/2014   Procedure: LEFT HEART CATHETERIZATION WITH CORONARY ANGIOGRAM;  Surgeon: Peter M Martinique, MD;  Location: [facility] CATH LAB;  Service: Cardiovascular;  Laterality: [name];  . NM MYOCAR PERF WALL MOTION  09/2012   lexiscan myoview - mod-severe perfusion defect r/t infarct or scar w/mild periinfarct ishcemia in basal inferior, mid inferior, apical inferior, basal inferolateral & mid inferoalteral regions - EF 21% low risk scan  . POLYPECTOMY    . RENAL ARTERY STENT Left 03/24/2004   6x35mm Genesis stent (Dr. Marella Chimes)  . RIGHT/LEFT HEART CATH AND CORONARY ANGIOGRAPHY [name] 12/28/2017   Procedure: RIGHT/LEFT HEART CATH AND CORONARY ANGIOGRAPHY;  Surgeon: Larey Dresser, MD;  Location: [facility] CV LAB;  Service: Cardiovascular;  Laterality: [name];  . TRANSTHORACIC ECHOCARDIOGRAM  12/2012   EF 30-35; LV mod to severely dilated, mod concentric hypertrophy, severe hypokinesis of inferolateral myocardium, moderate hypokineis of anteroseptal region, grade 1 diastolic dysfunction; mod MR; LA mod-severely dialted; RV mod dialted; RA mildly dilated; PA peak  pressure 77mmHg  . VIDEO BRONCHOSCOPY WITH ENDOBRONCHIAL NAVIGATION [name] 02/23/2017   Procedure: VIDEO BRONCHOSCOPY WITH ENDOBRONCHIAL NAVIGATION;  Surgeon: [name], MD;  Location: [facility] OR;  Service: Thoracic;  Laterality: [name];    Family History  Problem Relation Age of Onset  . Lung cancer Mother   . Cancer Mother   . Diabetes Mother   .  Hypertension Mother   . Hyperlipidemia Mother   . Heart disease Mother        before age 74  . Heart attack Father   . Heart disease Father        before age 45  . Hypertension Father   . Colon cancer Sister   . Cancer Sister   . Hypertension Sister   . Hyperlipidemia Sister   . Parkinson's disease Sister   . Diabetes Sister   . Heart disease Sister        before age 33  . Heart attack Maternal Grandmother   . Diabetes Daughter     Social History   Tobacco Use  . Smoking status: Former Smoker    Packs/day: 1.50    Years: 40.00    Pack years: 60.00    Types: Cigarettes, Pipe    Quit date: 12/11/2002    Years since quitting: 17.3  . Smokeless tobacco: Never Used  . Tobacco comment: Pt reports smoking 1 pack/day before quitting in 2004  Substance Use Topics  . Alcohol use: No    Alcohol/week: 0.0 standard drinks    Subjective:   I connected with [name] on 04/19/20 at  1:00 PM EDT by a video enabled telemedicine application and verified that I am speaking with the correct person using two identifiers.   I discussed the limitations of evaluation and management by telemedicine and the availability of in person appointments. The patient expressed understanding and agreed to proceed. Provider in office/ patient is at home; provider and patient are only 2 people on video call.   Patient has known COPD and chronic bronchitis; complaining of re-flare of symptoms again; I originally saw patient with similar symptoms in March- he got better on Augmentin but did not clear completely; was re-treated in mid- April with another  course of Augmentin and had normal CXR at that time; notes that symptoms have begun to re-flare in the past few weeks; uses Trelegy and albuterol regularly;    Objective:  There were no vitals filed for this visit.  General: Well developed, well nourished, in no acute distress  Head: Normocephalic and atraumatic  Lungs: Respirations unlabored;  Neurologic: Alert and oriented; speech intact; face symmetrical;   Assessment:  1. COPD with acute bronchitis ([place])   2. Cough     Plan:  Will re-treat with Doxycycline x 14 days; refill given on Tessalon Perles; patient is encouraged to try and see his pulmonologist in follow up sooner than the end of June- he is in agreement; follow-up as needed otherwise.  No follow-ups on file.  No orders of the defined types were placed in this encounter.   Requested Prescriptions   Signed Prescriptions Disp Refills  . doxycycline (VIBRA-TABS) 100 MG tablet 28 tablet 0    Sig: Take 1 tablet (100 mg total) by mouth 2 (two) times daily.  . benzonatate (TESSALON) 200 MG capsule 60 capsule 0    Sig: Take 1 capsule (200 mg total) by mouth 3 (three) times daily as needed for cough.

## 2020-04-20 ENCOUNTER — Other Ambulatory Visit: Payer: Self-pay | Admitting: Cardiology

## 2020-04-30 ENCOUNTER — Encounter (HOSPITAL_COMMUNITY): Payer: Self-pay | Admitting: Cardiology

## 2020-04-30 ENCOUNTER — Telehealth (HOSPITAL_COMMUNITY): Payer: Self-pay | Admitting: Pharmacy Technician

## 2020-04-30 ENCOUNTER — Ambulatory Visit (HOSPITAL_COMMUNITY)
Admission: RE | Admit: 2020-04-30 | Discharge: 2020-04-30 | Disposition: A | Discharge status: Home / Self Care | Payer: Medicare Other | Source: Ambulatory Visit | Attending: Cardiology | Admitting: Cardiology

## 2020-04-30 ENCOUNTER — Other Ambulatory Visit: Payer: Self-pay

## 2020-04-30 ENCOUNTER — Telehealth (HOSPITAL_COMMUNITY): Payer: Self-pay | Admitting: Cardiology

## 2020-04-30 VITALS — BP 120/70 | HR 70 | Ht 68.0 in | Wt 271.8 lb

## 2020-04-30 DIAGNOSIS — Z8349 Family history of other endocrine, nutritional and metabolic diseases: Secondary | ICD-10-CM | POA: Insufficient documentation

## 2020-04-30 DIAGNOSIS — E785 Hyperlipidemia, unspecified: Secondary | ICD-10-CM | POA: Insufficient documentation

## 2020-04-30 DIAGNOSIS — Z8249 Family history of ischemic heart disease and other diseases of the circulatory system: Secondary | ICD-10-CM | POA: Insufficient documentation

## 2020-04-30 DIAGNOSIS — I480 Paroxysmal atrial fibrillation: Secondary | ICD-10-CM | POA: Insufficient documentation

## 2020-04-30 DIAGNOSIS — K219 Gastro-esophageal reflux disease without esophagitis: Secondary | ICD-10-CM | POA: Diagnosis not present

## 2020-04-30 DIAGNOSIS — Z79899 Other long term (current) drug therapy: Secondary | ICD-10-CM | POA: Insufficient documentation

## 2020-04-30 DIAGNOSIS — J449 Chronic obstructive pulmonary disease, unspecified: Secondary | ICD-10-CM | POA: Diagnosis not present

## 2020-04-30 DIAGNOSIS — I447 Left bundle-branch block, unspecified: Secondary | ICD-10-CM | POA: Diagnosis not present

## 2020-04-30 DIAGNOSIS — C3431 Malignant neoplasm of lower lobe, right bronchus or lung: Secondary | ICD-10-CM | POA: Insufficient documentation

## 2020-04-30 DIAGNOSIS — Z87891 Personal history of nicotine dependence: Secondary | ICD-10-CM | POA: Insufficient documentation

## 2020-04-30 DIAGNOSIS — N400 Enlarged prostate without lower urinary tract symptoms: Secondary | ICD-10-CM | POA: Diagnosis not present

## 2020-04-30 DIAGNOSIS — G4733 Obstructive sleep apnea (adult) (pediatric): Secondary | ICD-10-CM | POA: Diagnosis not present

## 2020-04-30 DIAGNOSIS — R911 Solitary pulmonary nodule: Secondary | ICD-10-CM | POA: Diagnosis not present

## 2020-04-30 DIAGNOSIS — E1151 Type 2 diabetes mellitus with diabetic peripheral angiopathy without gangrene: Secondary | ICD-10-CM | POA: Insufficient documentation

## 2020-04-30 DIAGNOSIS — E039 Hypothyroidism, unspecified: Secondary | ICD-10-CM | POA: Insufficient documentation

## 2020-04-30 DIAGNOSIS — I252 Old myocardial infarction: Secondary | ICD-10-CM | POA: Insufficient documentation

## 2020-04-30 DIAGNOSIS — R251 Tremor, unspecified: Secondary | ICD-10-CM | POA: Diagnosis not present

## 2020-04-30 DIAGNOSIS — I493 Ventricular premature depolarization: Secondary | ICD-10-CM | POA: Diagnosis not present

## 2020-04-30 DIAGNOSIS — I5022 Chronic systolic (congestive) heart failure: Secondary | ICD-10-CM | POA: Insufficient documentation

## 2020-04-30 DIAGNOSIS — I110 Hypertensive heart disease with heart failure: Secondary | ICD-10-CM | POA: Insufficient documentation

## 2020-04-30 DIAGNOSIS — I739 Peripheral vascular disease, unspecified: Secondary | ICD-10-CM

## 2020-04-30 DIAGNOSIS — Z7901 Long term (current) use of anticoagulants: Secondary | ICD-10-CM | POA: Insufficient documentation

## 2020-04-30 DIAGNOSIS — Z95810 Presence of automatic (implantable) cardiac defibrillator: Secondary | ICD-10-CM | POA: Diagnosis not present

## 2020-04-30 DIAGNOSIS — Z951 Presence of aortocoronary bypass graft: Secondary | ICD-10-CM | POA: Diagnosis not present

## 2020-04-30 DIAGNOSIS — Z923 Personal history of irradiation: Secondary | ICD-10-CM | POA: Diagnosis not present

## 2020-04-30 DIAGNOSIS — I2510 Atherosclerotic heart disease of native coronary artery without angina pectoris: Secondary | ICD-10-CM | POA: Diagnosis not present

## 2020-04-30 DIAGNOSIS — I255 Ischemic cardiomyopathy: Secondary | ICD-10-CM

## 2020-04-30 DIAGNOSIS — Z801 Family history of malignant neoplasm of trachea, bronchus and lung: Secondary | ICD-10-CM | POA: Insufficient documentation

## 2020-04-30 DIAGNOSIS — I5042 Chronic combined systolic (congestive) and diastolic (congestive) heart failure: Secondary | ICD-10-CM

## 2020-04-30 LAB — COMPREHENSIVE METABOLIC PANEL
ALT: 15 U/L (ref 0–44)
AST: 16 U/L (ref 15–41)
Albumin: 3.7 g/dL (ref 3.5–5.0)
Alkaline Phosphatase: 70 U/L (ref 38–126)
Anion gap: 10 (ref 5–15)
BUN: 86 mg/dL — ABNORMAL HIGH (ref 8–23)
CO2: 23 mmol/L (ref 22–32)
Calcium: 9 mg/dL (ref 8.9–10.3)
Chloride: 103 mmol/L (ref 98–111)
Creatinine, Ser: 2.77 mg/dL — ABNORMAL HIGH (ref 0.61–1.24)
GFR calc Af Amer: 25 mL/min — ABNORMAL LOW (ref >60)
GFR calc non Af Amer: 21 mL/min — ABNORMAL LOW (ref >60)
Glucose, Bld: 114 mg/dL — ABNORMAL HIGH (ref 70–99)
Potassium: 4.8 mmol/L (ref 3.5–5.1)
Sodium: 136 mmol/L (ref 135–145)
Total Bilirubin: 1.3 mg/dL — ABNORMAL HIGH (ref 0.3–1.2)
Total Protein: 6.3 g/dL — ABNORMAL LOW (ref 6.5–8.1)

## 2020-04-30 LAB — TSH: TSH: 4.784 u[IU]/mL — ABNORMAL HIGH (ref 0.350–4.500)

## 2020-04-30 MED ORDER — JARDIANCE 10 MG PO TABS
10.0000 mg | ORAL_TABLET | Freq: Every day | ORAL | 11 refills | Status: DC
Start: 2020-04-30 — End: 2020-04-30

## 2020-04-30 MED ORDER — ENTRESTO 24-26 MG PO TABS
1.0000 | ORAL_TABLET | Freq: Two times a day (BID) | ORAL | 11 refills | Status: DC
Start: 2020-05-03 — End: 2020-12-24

## 2020-04-30 MED ORDER — AMIODARONE HCL 100 MG PO TABS: 100.0000 mg | ORAL_TABLET | Freq: Every day | ORAL | 11 refills | Status: DC

## 2020-04-30 MED ORDER — TORSEMIDE 20 MG PO TABS: ORAL_TABLET | ORAL | 5 refills | Status: DC

## 2020-04-30 NOTE — Telephone Encounter (Signed)
Starting patient assistance application for both Xarelto ($394.69) and Jardiance ($146.71). Called and left the patient a message to return my call.  Will follow up.

## 2020-04-30 NOTE — Patient Instructions (Signed)
START Jardiance 10 mg, one tab daily DECREASE Amiodarone to 100 mg, one tab daily  Labs today We will only contact you if something comes back abnormal or we need to make some changes. Otherwise no news is good news!  Labs needed in 10-14 days  Your physician recommends that you schedule a follow-up appointment in: 3 months with Dr Aundra Dubin  Do the following things EVERYDAY: 1) Weigh yourself in the morning before breakfast. Write it down and keep it in a log. 2) Take your medicines as prescribed 3) Eat low salt foods--Limit salt (sodium) to 2000 mg per day.  4) Stay as active as you can everyday 5) Limit all fluids for the day to less than 2 liters  At the [facility], you and your health needs are our priority. As part of our continuing mission to provide you with exceptional heart care, we have created designated Provider Care Teams. These Care Teams include your primary Cardiologist (physician) and Advanced Practice Providers (APPs- Physician Assistants and Nurse Practitioners) who all work together to provide you with the care you need, when you need it.   You may see any of the following providers on your designated Care Team at your next follow up: Marland Kitchen Dr [name] . Dr [name] . Darrick Grinder, NP . Lyda Jester, PA . Audry Riles, PharmD   Please be sure to bring in all your medications bottles to every appointment.

## 2020-04-30 NOTE — Telephone Encounter (Signed)
PT AWARE VIA WIFE AND VOICED UNDERSTANDING REPEAT LABS 5/25 @ 12 WILL GIVE FOLLOW UP APPT AT LAB VISIT

## 2020-04-30 NOTE — Telephone Encounter (Signed)
-----   Message from Larey Dresser, MD sent at 04/30/2020  2:39 PM EDT ----- Stop Delene Loll for 2 days, decrease dose to 24/26 bid after that.  Stop torsemide for 2 days, decrease to 40 qam/20 qpm after that.  Do not start dapagliflozin yet. Needs BMET early next week and will need followup with NP/PA in 10-14 days.

## 2020-05-01 ENCOUNTER — Other Ambulatory Visit: Payer: Self-pay | Admitting: Internal Medicine

## 2020-05-02 NOTE — Progress Notes (Signed)
Date:  05/02/2020   ID:  [name], DOB 1946/02/24, MRN [id number]    Provider location: [facility] Type of Visit: Established patient  PCP:  [name], MD  Cardiologist:  [name]  Chief Complaint: Shortness of breath   History of Present Illness: [name] is a 74 y.o. male who has a past medical history significant for morbid obesity, systolic heart failure with an EF of 30-35% (January 2014) -> 20-25% (March 2015 - RV normal), VT on amio, CAD s/p CABG 1996, LBBB, COPD, obstructive sleep apnea on CPAP, AAA repair (January 2015).   Admitted to [facility] July 24 through July 06 2014 with chest pain.  CEs negative. Had a LHC with no change from previous LHC with recommendations to continue medical management. Discharge weight was 255 pounds.   LHC 07/06/14 --No significant change from previous studies.  Left anterior descending (LAD): The LAD is a large vessel. There is a 40% stenosis immediately after the takeoff of a large septal perforator. There are 2 large diagonal branches without significant disease. Left circumflex (LCx): The LCx is occluded proximally. There are left to left collaterals.  Right coronary artery (RCA): The RCA is occluded proximally immediately following the conus branch. There are right to right and left to right collaterals. SVGs from CABG known to be totally occluded.   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   CTA chest/abd/pelvis (3/16) with moderate emphysema, 1.3 cm nodule RUL, left SFA totally occluded, right SFA with significant stenosis, s/p endovascular AAA repair (stable).   Echo (5/17) with EF 20-25%, severe LV dilation, mild MR, normal RV size with mildly decreased systolic function.   He has a RUL nodule that is most likely lung cancer, he is being treated with radiation.   Echo (8/18) with EF 30%, basal-mid inferior and inferolateral AK, severe LV dilation, normal RV, mild MR.   RHC/LHC was  done in 1/19 due to worsening exertional dyspnea.  This showed normal filling pressures and preserved cardiac output.  There was 30-40% mLAD stenosis.  The LAD provided collaterals to the LCx and RCA territories.  The LCx, RCA, SVG-OM, and SVG-PDA were all chronically occluded (known from the past).  No new disease.   [facility] with 0.78 on right, 0.59 on left => left iliac > 50% stenosis.  11/19 ABIs 0.78 right, 0.59 left.  3/20 ABIs 0.74 right, 0.66 left.   He saw neurology about his tremor.  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  However, he is on amiodarone which may be contributing.  He has been seeing Dr. Vertell Limber with plan for deep brain stimulator, on hold currently due to cancer treatment.   He had chest radiation for RLL lung cancer.  However, CT showed new RUL lesion in 11/19 and he restart XRT in 12/19, repeat XRT is now completed.   Followup CT chest showed a lesion in the left upper lobe.  He had XRT again, finishing in 9/20.   Echo in 7/20 showed EF 35-40%, basal to mid inferolateral and inferior akinesis, mildly decreased RV systolic function, mild AS, PASP 22 mmHg.    He returns for followup of CHF and CAD.  Weight is down 10 lbs.  He is no longer having episodes of chest pain associated with heart racing.  He thinks that the Imdur has helped with the chest pain.  He is breathing somewhat better with weight loss and is only  using oxygen prn. He does ok walking in the house, gets short of breath walking longer distances.  Short of breath bathing and dressing.    [employer] device interrogation: 1 short episode of atrial fibrillation since last interrogation.  ECG (personally reviewed): A-V sequential pacing  Labs 3/15 Cr 1.5 K 5.6 Labs 07/06/14 K 3.9 Creatinine  0.98 Labs 9/15 K 5.1, creatinine 0.96 Labs 11/15 K 3.8, creatinine 0.83, LDL 61, LFTs normal, TSH normal Labs 11/26/14 K 5.0 Creatinine 0.73 , LFTs normal, TSH normal,  Labs 12/08/14 K 3.4  Creatinine 0.83  Labs 3/16 K 4.4, creatinine 1.03, LFTs normal, TSH normal, HCT 37.8, BNP 65 Labs 6/16 K 4.1, creatinine 0.93, TSH normal, LFTs normal Labs 11/16 K 5.1, creatinine 1.13, LFTs normal, TSH normal, LDL 83, HDL 33, TGs 162 Labs 1/17 K 4.4, creatinine 0.86, pro-BNP 154 Labs 2/17 BNP 113, K 3.8, creatinine 0.79, TSH normal, LFTs normal Labs 4/17 K 3.8, creatinine 0.91, HCT 48.1 Labs 6/17 K 3.5, creatinine 0.98 Labs 9/17 K 3.9, creatinine 0.79, LDL 69, HDL 29, TSH normal, LFTs normal Labs 3/18 K 3.6, creatinine 0.87 Labs 8/18 K 3.3 => 4.2, creatinine 0.78 => 1.04, LDL 66, HDL 38, BNP 166 Labs 9/18: K 4.7, creatinine 0.9, BNP 144 Labs 11/18: K 4.8, creatinine 1.05, TSH normal, LFTs normal.  Labs 1/19: K 4.4, creatinine 1.08, LFTs normal Labs 5/19: LDL 60, HDL 28, TSH normal, LFTs normal, K 4.6, creatinine 1.43 Labs 11/19: K 4.6, creatinine 1.42, LFTs normal, TSH normal, Mg 2.3 Labs 1/20: TSH normal, K 4.2, creatinine 1.12, LFTs normal Labs 4/20: LDL 42, HDL 28, TGs 234 Labs 5/20: K 4.8 => 4.4, creatinine 1.95 => 1.38, TSH normal, LFTs normal Labs 7/20: K 4.1, creatinine 1.22 Labs 9/20: LDL 36, HDL 29 Labs 3/21: K 4.5, creatinine 1.76, hgb 11.8 Labs 4/21: K 4.4, creatinine 1.63, hgb 12.8, TSH 4.67 (elevated)  Past Medical History:  Diagnosis Date  . AAA (abdominal aortic aneurysm) ([place])    followed by Dr. Bridgett Larsson  . Adenomatous colon polyp 01/2004  . Anemia    hx  . Automatic implantable cardioverter-defibrillator in situ   . AVM (arteriovenous malformation)   . BPH (benign prostatic hypertrophy)   . CAD (coronary artery disease)    s/p CABGx2 in 1996  . Carotid artery stenosis    LCEA - Dr. Bridgett Larsson in 2013  . CHF (congestive heart failure) ([place])   . Complication of anesthesia    claustrophobic, unabe to lie on back more than 4 hours at time due to back  . COPD (chronic obstructive pulmonary disease) ([place])   . Diabetes mellitus    DIET CONTROLLED- pt states that this was a  misdiagnosis, he was treated while in the hospital  but returned home, loss a massive amount of weight and he has not had a problem with his blood sugar since. States everything has been normal for about 3 years.  . Diverticulosis   . Dyspnea   . Dysrhythmia    ICD-defibrillator  . Fatigue   . GERD (gastroesophageal reflux disease)   . H/O hiatal hernia   . History of radiation therapy 04/09/17-04/17/17   SBRT right lung 54 Gy in 3 fractions  . HLD (hyperlipidemia)   . Hypertension   . Hypothyroidism   . Ischemic cardiomyopathy   . Myocardial infarction ([place])   . 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 OSA on CPAP    AHI durign total sleep 14.69/hr, during REM  50.91/hr  . Peripheral vascular disease (H[facility])    LCEA, L renal artery stent, bilat iliac stents, R SFA stenosis  . Tremor   . Ventricular tachycardia ([place]) 09/09/2014   Amiodarone was started after appropriate defibrillator shocks for ventricular tachycardia in October 2008   Past Surgical History:  Procedure Laterality Date  . ABDOMINAL AORTIC ENDOVASCULAR STENT GRAFT [name] 01/06/2014   Procedure: ABDOMINAL AORTIC ENDOVASCULAR STENT GRAFT- GORE; ULTRASOUND GUIDED;  Surgeon: Conrad Pine [place], MD;  Location: [place];  Service: Vascular;  Laterality: [name];  . ANGIOPLASTY     BILATERAL  LE  W/STENTS  . BI-VENTRICULAR IMPLANTABLE CARDIOVERTER DEFIBRILLATOR UPGRADE [name] 10/09/2014   Procedure: BI-VENTRICULAR IMPLANTABLE CARDIOVERTER DEFIBRILLATOR UPGRADE;  Surgeon: Evans Lance, MD;  Location: [facility] CATH LAB;  Service: Cardiovascular;  Laterality: [name];  . BIV ICD GENERTAOR CHANGE OUT  10/09/2014   UPGRADE TO BIV        BY DR Lovena Le  . 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 Adora Fridge)  . CARDIAC CATHETERIZATION  10/03/2002   SVG sequentially to OM1 & OM2 totally  occluded at ostium, SVG to PDA totally occluded within previously placed prox vein graft stent, smal distal AAA, bialt iliac stents with 30% left end-stent restenosis and 50% right end-stent restnosis(Dr. Gerrie Nordmann)  . CARDIAC CATHETERIZATION  06/11/1998   L main with 20% narrowing in distal 1/3; LAD with 1st diagonla having 70% ostial narrowing, 2nd diagonal with 40% narrowing in prox third, LIMA & RIMA widely patent; in-stent restenosis of RCIA with successful PTA and new prox SVTRCA stent for residual disease (Dr. Marella Chimes)  . CARDIAC DEFIBRILLATOR PLACEMENT  06/2005   Guidant Vitality HE - ischemic cardiomyopathy - Dr. Marella Chimes  . CAROTID ENDARTERECTOMY Left 01/04/12  . CORONARY ANGIOPLASTY  01/08/2004   cutting balloon atherectomy & percutaneous intervention of RCIA in-stent restenosis (Dr. Marella Chimes)  . CORONARY ARTERY BYPASS GRAFT  11/04/1985   x2 - PDA and sequential DX-OM (Dr. Redmond Pulling)  . ENDARTERECTOMY  01/04/2012   Procedure: ENDARTERECTOMY CAROTID;left  Surgeon: Hinda Lenis, MD;  Location: [place];  Service: Vascular;  Laterality: Left;  with patch angioplasty  . FUDUCIAL PLACEMENT [name] 02/23/2017   Procedure: PLACEMENT OF FUDUCIAL;  Surgeon: [name], MD;  Location: [place];  Service: Thoracic;  Laterality: [name];  . ICD GENERATOR CHANGE  05/02/2010   [employer], retail  . ILIAC ARTERY STENT Bilateral 03/1997   and L SFA PTA  . Iron infusion  June 16, 2012  . LEFT HEART CATHETERIZATION WITH CORONARY ANGIOGRAM [name] 07/06/2014   Procedure: LEFT HEART CATHETERIZATION WITH CORONARY ANGIOGRAM;  Surgeon: Peter M Martinique, MD;  Location: [facility] CATH LAB;  Service: Cardiovascular;  Laterality: [name];  . NM MYOCAR PERF WALL MOTION  09/2012   lexiscan myoview - mod-severe perfusion defect r/t infarct or scar w/mild periinfarct ishcemia in basal inferior, mid inferior, apical inferior, basal inferolateral & mid inferoalteral regions - EF 21% low risk scan  . POLYPECTOMY    .  RENAL ARTERY STENT Left 03/24/2004   6x56mm Genesis stent (Dr. Marella Chimes)  . RIGHT/LEFT HEART CATH AND CORONARY ANGIOGRAPHY [name] 12/28/2017   Procedure: RIGHT/LEFT HEART CATH AND CORONARY ANGIOGRAPHY;  Surgeon: Larey Dresser, MD;  Location: [place] CV LAB;  Service: Cardiovascular;  Laterality: [name];  .  TRANSTHORACIC ECHOCARDIOGRAM  12/2012   EF 30-35; LV mod to severely dilated, mod concentric hypertrophy, severe hypokinesis of inferolateral myocardium, moderate hypokineis of anteroseptal region, grade 1 diastolic dysfunction; mod MR; LA mod-severely dialted; RV mod dialted; RA mildly dilated; PA peak pressure 58mmHg  . VIDEO BRONCHOSCOPY WITH ENDOBRONCHIAL NAVIGATION [name] 02/23/2017   Procedure: VIDEO BRONCHOSCOPY WITH ENDOBRONCHIAL NAVIGATION;  Surgeon: [name], MD;  Location: [place];  Service: Thoracic;  Laterality: [name];     Current Outpatient Medications  Medication Sig Dispense Refill  . albuterol (PROVENTIL HFA;VENTOLIN HFA) 108 (90 BASE) MCG/ACT inhaler Inhale 2 puffs into the lungs every 6 (six) hours as needed for wheezing or shortness of breath. 3 Inhaler 4  . albuterol (PROVENTIL) (2.5 MG/3ML) 0.083% nebulizer solution USE 1 VIAL IN NEBULIZER 4 TIMES DAILY 75 mL 11  . amiodarone (PACERONE) 100 MG tablet Take 1 tablet (100 mg total) by mouth daily. 30 tablet 11  . atorvastatin (LIPITOR) 80 MG tablet TAKE 1 TABLET BY MOUTH EVERY DAY 90 tablet 2  . ATROVENT HFA 17 MCG/ACT inhaler TAKE 2 PUFFS BY MOUTH EVERY 4 HOURS AS NEEDED FOR WHEEZE  12  . carvedilol (COREG) 25 MG tablet Take 1 tablet (25 mg total) by mouth 2 (two) times daily with a meal. 180 tablet 1  . diazepam (VALIUM) 5 MG tablet Take 1 tablet (5 mg total) by mouth every 8 (eight) hours as needed for anxiety. 1-2 tabs every 8 hours if needed for tremor 30 tablet 5  . ezetimibe (ZETIA) 10 MG tablet Take 1 tablet (10 mg total) by mouth daily. 90 tablet 3  . finasteride (PROSCAR) 5 MG tablet Take 5 mg by mouth daily.     . fluticasone (FLONASE) 50 MCG/ACT nasal spray Instill 1 spray in each  nostril daily 32 g 3  . Fluticasone-Umeclidin-Vilant (TRELEGY ELLIPTA) 100-62.5-25 MCG/INH AEPB Inhale 1 puff into the lungs daily. Rinse mouth 60 each 12  . Iron-FA-B Cmp-C-Biot-Probiotic (FUSION PLUS) CAPS Take 1 capsule by mouth daily. 90 capsule 1  . isosorbide mononitrate (IMDUR) 60 MG 24 hr tablet Take 1.5 tablets (90 mg total) by mouth daily. 45 tablet 5  . KLOR-CON M20 20 MEQ tablet TAKE 1 TABLET BY MOUTH EVERY DAY 90 tablet 2  . nitroGLYCERIN (NITROSTAT) 0.4 MG SL tablet Place 1 tablet (0.4 mg total) under the tongue every 5 (five) minutes as needed for chest pain. 25 tablet 3  . NON FORMULARY at bedtime. CPAP And during the day as needed    . rivaroxaban (XARELTO) 20 MG TABS tablet Take 1 tablet (20 mg total) by mouth daily with supper. 30 tablet 5  . spironolactone (ALDACTONE) 25 MG tablet TAKE 1 TABLET BY MOUTH  DAILY 90 tablet 3  . thiamine 100 MG tablet Take 1 tablet (100 mg total) by mouth every other day. 45 tablet 0  . VASCEPA 1 g capsule TAKE 2 CAPSULES (2 G TOTAL) BY MOUTH 2 (TWO) TIMES A DAY. 120 capsule 11  . levothyroxine (SYNTHROID) 50 MCG tablet TAKE 1 TABLET BY MOUTH  DAILY BEFORE BREAKFAST 90 tablet 1  . [START ON 05/03/2020] sacubitril-valsartan (ENTRESTO) 24-26 MG Take 1 tablet by mouth 2 (two) times daily. 60 tablet 11  . [START ON 05/03/2020] torsemide (DEMADEX) 20 MG tablet Take 2 tablets (40 mg total) by mouth every morning AND 1 tablet (20 mg total) every evening. 150 tablet 5   No current facility-administered medications for this encounter.    Allergies:   Tussionex  pennkinetic er [hydrocod polst-cpm polst er] and Ace inhibitors   Social History:  The patient  reports that he quit smoking about 17 years ago. His smoking use included cigarettes and pipe. He has a 60.00 pack-year smoking history. He has never used smokeless tobacco. He reports that he does not drink alcohol or use drugs.    Family History:  The patient's family history includes Cancer in his mother and sister; Colon cancer in his sister; Diabetes in his daughter, mother, and sister; Heart attack in his father and maternal grandmother; Heart disease in his father, mother, and sister; Hyperlipidemia in his mother and sister; Hypertension in his father, mother, and sister; Lung cancer in his mother; Parkinson's disease in his sister.   ROS:  Please see the history of present illness.   All other systems are personally reviewed and negative.   Exam:   BP 120/70   Pulse 70   Ht 5\' 8"  (1.727 m)   Wt 123.3 kg (271 lb 12.8 oz)   SpO2 99%   BMI 41.33 kg/m  General: NAD Neck: No JVD, no thyromegaly or thyroid nodule.  Lungs: Clear to auscultation bilaterally with normal respiratory effort. CV: Nondisplaced PMI.  Heart regular S1/S2, no S3/S4, no murmur.  No peripheral edema.  No carotid bruit.  Normal pedal pulses.  Abdomen: Soft, nontender, no hepatosplenomegaly, no distention.  Skin: Intact without lesions or rashes.  Neurologic: Alert and oriented x 3.  Psych: Normal affect. Extremities: No clubbing or cyanosis.  HEENT: Normal.   Recent Labs: 02/27/2020: Magnesium 2.3 03/19/2020: Hemoglobin 12.8; Platelets 130 04/30/2020: ALT 15; BUN 86; Creatinine, Ser 2.77; Potassium 4.8; Sodium 136; TSH 4.784  Personally reviewed   Wt Readings from Last 3 Encounters:  04/30/20 123.3 kg (271 lb 12.8 oz)  04/05/20 123.9 kg (273 lb 3.2 oz)  03/23/20 126.6 kg (279 lb)      ASSESSMENT AND PLAN:  1. Chronic systolic CHF: Ischemic cardiomyopathy. Echo in 8/18 showed EF 30% with severe LV dilation and normal RV.  [employer] CRT-D.  RHC in 1/19 showed normal filling pressures and preserved cardiac output.  Last echo in 7/20 showed EF 35-40% with mildly decreased RV systolic function.  NYHA class III symptoms chronically.  He does not appear significantly volume overloaded on exam today; I suspect that his dyspnea is more  due to his COPD.  Weight is down 10 lbs.   - Continue torsemide 60 qam/40 qpm.  BMET today.  - Continue Coreg 25 mg bid.   - Continue Entresto to 49/51 bid.     - Continue spironolactone 25 mg daily.  - If creatinine is stable today, add dapagliflozin 10 mg daily.  2. CAD s/p CABG: LHC 1/19 with stable anatomy (LAD is patent, other vessels occluded with collaterals from the LAD).  He has had angina with atrial fibrillation/RVR runs, however this seems to be better on Imdur.  No recent chest pain.  - Do not think he needs coronary angiography.  - Continue atorvastatin.   - He is not on ASA due to Xarelto use.   - Continue Imdur 90 mg daily.  3. VT s/p ICD: On amiodarone 100 mg daily for history of ICD discharges due to VT.   - CMET/TSH today.  He will need a regular eye exam.  4. Morbid obesity: Weight is lower.  5. COPD: Moderate to severe.  Historically, a lot of his dyspnea has seemed to be due to COPD.   - Followup with pulmonary. -  Now has home oxygen.  6. Lung cancer: He completed radiation.  7. PAD: Occluded left SFA and significant right SFA stenosis on last CTA.  ABIs in 3/20 with significant disease bilaterally but stable.  Does not get leg pain with exertion but has nocturnal cramps.  [facility] by [facility], medical management for now.  He is on statin, good lipids in 9/20.  8. AAA: s/p repair.  Followed at [facility].  No endoleak on last evaluation.  9. Tremor: Most likely essential tremor given family history.  However, cannot rule out role for amiodarone. As above, I think that he needs to continue at least a low dose of amiodarone.  Dr. Carles Collet with neurology discussed deep brain stimulation => this is on hold while he is being treated for lung cancer.   10. Hyperlipidemia: He is on Vascepa and atorvastatin.  - Good lipids in 9/20.  11. PVCs: Frequent PVCs noted in the past, suspect that this decreases the percentage of BiV pacing and also may contribute to low EF.  - Continue amiodarone to  suppress PVCs.  - Continue Coreg 25 mg bid . 12. Atrial fibrillation: Paroxysmal.    - Continue Coreg and amiodarone.  - Continue Xarelto, check CBC.   Followup in 3 months.   Signed, [name], MD  05/02/2020  [facility] [address] [facility] [place] [address] [phone] (office) [phone] (fax)

## 2020-05-04 ENCOUNTER — Other Ambulatory Visit: Payer: Self-pay

## 2020-05-04 ENCOUNTER — Ambulatory Visit (HOSPITAL_COMMUNITY)
Admission: RE | Admit: 2020-05-04 | Discharge: 2020-05-04 | Disposition: A | Discharge status: Home / Self Care | Payer: Medicare Other | Source: Ambulatory Visit | Attending: Internal Medicine | Admitting: Internal Medicine

## 2020-05-04 DIAGNOSIS — I5042 Chronic combined systolic (congestive) and diastolic (congestive) heart failure: Secondary | ICD-10-CM | POA: Diagnosis not present

## 2020-05-04 LAB — BASIC METABOLIC PANEL
Anion gap: 9 (ref 5–15)
BUN: 88 mg/dL — ABNORMAL HIGH (ref 8–23)
CO2: 23 mmol/L (ref 22–32)
Calcium: 9 mg/dL (ref 8.9–10.3)
Chloride: 107 mmol/L (ref 98–111)
Creatinine, Ser: 2.37 mg/dL — ABNORMAL HIGH (ref 0.61–1.24)
GFR calc Af Amer: 30 mL/min — ABNORMAL LOW (ref >60)
GFR calc non Af Amer: 26 mL/min — ABNORMAL LOW (ref >60)
Glucose, Bld: 109 mg/dL — ABNORMAL HIGH (ref 70–99)
Potassium: 4.7 mmol/L (ref 3.5–5.1)
Sodium: 139 mmol/L (ref 135–145)

## 2020-05-11 ENCOUNTER — Other Ambulatory Visit: Payer: Self-pay

## 2020-05-11 ENCOUNTER — Ambulatory Visit (HOSPITAL_COMMUNITY)
Admission: RE | Admit: 2020-05-11 | Discharge: 2020-05-11 | Disposition: A | Discharge status: Home / Self Care | Payer: Medicare Other | Source: Ambulatory Visit | Attending: Cardiology | Admitting: Cardiology

## 2020-05-11 DIAGNOSIS — I255 Ischemic cardiomyopathy: Secondary | ICD-10-CM | POA: Diagnosis not present

## 2020-05-11 LAB — BASIC METABOLIC PANEL
Anion gap: 10 (ref 5–15)
BUN: 62 mg/dL — ABNORMAL HIGH (ref 8–23)
CO2: 23 mmol/L (ref 22–32)
Calcium: 9.2 mg/dL (ref 8.9–10.3)
Chloride: 108 mmol/L (ref 98–111)
Creatinine, Ser: 1.69 mg/dL — ABNORMAL HIGH (ref 0.61–1.24)
GFR calc Af Amer: 45 mL/min — ABNORMAL LOW (ref >60)
GFR calc non Af Amer: 39 mL/min — ABNORMAL LOW (ref >60)
Glucose, Bld: 100 mg/dL — ABNORMAL HIGH (ref 70–99)
Potassium: 4.4 mmol/L (ref 3.5–5.1)
Sodium: 141 mmol/L (ref 135–145)

## 2020-05-11 NOTE — Telephone Encounter (Signed)
Spoke with patient's wife, will email her the Xarelto PAP application. Holding off on Jardiance start at this time, due to labs. Will call wife after labs have been checked to update. Have 14 day free card to use if we do start Jardiance.   Will follow up.

## 2020-05-21 ENCOUNTER — Ambulatory Visit (INDEPENDENT_AMBULATORY_CARE_PROVIDER_SITE_OTHER): Payer: Medicare Other | Admitting: *Deleted

## 2020-05-21 DIAGNOSIS — I255 Ischemic cardiomyopathy: Secondary | ICD-10-CM

## 2020-05-21 LAB — CUP PACEART REMOTE DEVICE CHECK
Battery Remaining Longevity: 48 mo
Battery Remaining Percentage: 64 %
Brady Statistic RA Percent Paced: 77 %
Brady Statistic RV Percent Paced: 90 %
Date Time Interrogation Session: 20210611025500
HighPow Impedance: 49 Ohm
Implantable Lead Implant Date: 20060411
Implantable Lead Implant Date: 20151030
Implantable Lead Implant Date: 20151030
Implantable Lead Location: 753858
Implantable Lead Location: 753859
Implantable Lead Location: 753860
Implantable Lead Model: 148
Implantable Lead Model: 4136
Implantable Lead Serial Number: 145070
Implantable Lead Serial Number: 29713415
Implantable Pulse Generator Implant Date: 20151030
Lead Channel Impedance Value: 581 Ohm
Lead Channel Impedance Value: 603 Ohm
Lead Channel Impedance Value: 809 Ohm
Lead Channel Pacing Threshold Amplitude: 0.5 V
Lead Channel Pacing Threshold Amplitude: 0.7 V
Lead Channel Pacing Threshold Amplitude: 1.4 V
Lead Channel Pacing Threshold Pulse Width: 0.4 ms
Lead Channel Pacing Threshold Pulse Width: 0.4 ms
Lead Channel Pacing Threshold Pulse Width: 0.9 ms
Lead Channel Setting Pacing Amplitude: 2 V
Lead Channel Setting Pacing Amplitude: 2.2 V
Lead Channel Setting Pacing Amplitude: 3.5 V
Lead Channel Setting Pacing Pulse Width: 0.4 ms
Lead Channel Setting Pacing Pulse Width: 0.9 ms
Lead Channel Setting Sensing Sensitivity: 0.6 mV
Lead Channel Setting Sensing Sensitivity: 1 mV
Pulse Gen Serial Number: 370874

## 2020-05-21 NOTE — Progress Notes (Signed)
Remote ICD transmission.   

## 2020-05-23 ENCOUNTER — Other Ambulatory Visit (HOSPITAL_COMMUNITY): Payer: Self-pay | Admitting: Cardiology

## 2020-05-23 DIAGNOSIS — I5042 Chronic combined systolic (congestive) and diastolic (congestive) heart failure: Secondary | ICD-10-CM

## 2020-05-25 NOTE — Telephone Encounter (Signed)
I have yet to receive patient's assistance application. Sent follow up email.

## 2020-05-31 ENCOUNTER — Ambulatory Visit (INDEPENDENT_AMBULATORY_CARE_PROVIDER_SITE_OTHER): Payer: Medicare Other | Admitting: Internal Medicine

## 2020-05-31 ENCOUNTER — Other Ambulatory Visit: Payer: Self-pay

## 2020-05-31 ENCOUNTER — Encounter: Payer: Self-pay | Admitting: Internal Medicine

## 2020-05-31 VITALS — BP 122/72 | HR 69 | Temp 97.3°F | Ht 68.0 in | Wt 269.7 lb

## 2020-05-31 DIAGNOSIS — J449 Chronic obstructive pulmonary disease, unspecified: Secondary | ICD-10-CM | POA: Diagnosis not present

## 2020-05-31 DIAGNOSIS — J9611 Chronic respiratory failure with hypoxia: Secondary | ICD-10-CM | POA: Diagnosis not present

## 2020-05-31 DIAGNOSIS — G4733 Obstructive sleep apnea (adult) (pediatric): Secondary | ICD-10-CM

## 2020-05-31 DIAGNOSIS — I255 Ischemic cardiomyopathy: Secondary | ICD-10-CM

## 2020-05-31 DIAGNOSIS — Z9989 Dependence on other enabling machines and devices: Secondary | ICD-10-CM | POA: Diagnosis not present

## 2020-05-31 MED ORDER — TRELEGY ELLIPTA 100-62.5-25 MCG/INH IN AEPB: 1.0000 | INHALATION_SPRAY | Freq: Every day | RESPIRATORY_TRACT | 12 refills | Status: AC

## 2020-05-31 NOTE — Patient Instructions (Addendum)
Order- DME [facility]- please replace old CPAP machine, change to auto 10-20, bleed in O2 3L, continue mask of choice, humidifier, supplies, AirView/ card  Refill sent for Trelegy 100 inhaler  Please call if we can help

## 2020-05-31 NOTE — Progress Notes (Signed)
HPI 74 year old male former smoker followed for dyspnea/COPD, lung cancer/ XRT, OSA complicated by CAD/CHF/MI/AICD/CM/AAA, AICD, morbid obesity DM 2 HBP, GERD, tremor/ deep brainstimulator NPSG 11/05/07- [name]- AHI 14.6/ hr, desaturation to 84%, body weight 279 lbs PET scan 10/25/2018-  progressive increased soft tissue within the right upper lobe suspicious for recurrent tumor. -------------------------------------------------------------------   12/02/2019- Virtual Visit via Telephone Note  History of Present Illness: 74 year old male former smoker followed for dyspnea/COPD, lung nodules/ XRT, OSA complicated by CAD/CHF/MI/AICD/CM/AAA, morbid obesity,  DM 2 HBP, GERD, tremor/ deep brain stimulator CPAP 12 / [facility]/ AHP  O2 2- 3 L. Download compliance 97%, AHI 2.1/ hr Albuterol hfa, neb albuterol, neb Brovana, Atrovent HFA, neb Pulmicort, Trelegy,  Lonhala Magnair, Pred 10 mg daily,  Says CPAP machine broke. Still using O2 Continues Rad Onc f/u for relapsed lung mass presumed malignant.  Trying to walk more.    Observations/Objective: PET 07/23/2019- IMPRESSION: 1. Hypermetabolic left upper lobe nodule, most consistent with bronchogenic carcinoma. 2. No additional abnormal hypermetabolism in the neck, chest, abdomen or pelvis. 3. Cirrhosis. 4. Cholelithiasis. 5. Right renal stone. 6. 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 7.  Aortic atherosclerosis (ICD10-170.0).  CT chest 06/11/2019- IMPRESSION: 1. Progressive changes of external beam radiation within the right upper lobe. The previously described central area of increased soft tissue is mildly increased from 09/16/2018. The appearance is nonspecific in may reflect progressive changes of external beam radiation. The second area reported previously up progressive increased soft tissue is no longer visualized and may have resolved or is obscured by progressive radiation changes 2. Left upper lobe lung lesion has clearly  increased in size and appears more solid on today's study and is worrisome for progressive disease. 3. Aortic Atherosclerosis (ICD10-I70.0) and Emphysema (ICD10-J43.9).   Assessment and Plan: OSA- replace broken machine auto 10-15 COPD - refill Trelegy Lung Cancer- progression LUL after XRT R lung with radiation fibrosis  Follow Up Instructions: 6 months   --------------- 05/31/20- 74 year old male former smoker followed for dyspnea/COPD, lung nodules/ XRT, OSA complicated by CAD/CHF/MI/AICD/CM/AAA, morbid obesity,  DM 2 HBP, GERD, tremor/ deep brain stimulator CPAP 12 / [facility]/ AHP  O2 2- 3 L. -----OSA on CPAP Body weight 269 lbs Neb albuterol, albuterol hfa, Trelegy 100,  Continues xarelto and amiodarone [facility] reports he is compliant with CPAP and he says he sleeps better with it and oxygen, but no download available at this time. Covid vax- 2 Phizer Prolonged sinus infection and bronchitis respsonded finally to antibiotics with tessalon. CT chest 12/19/19- IMPRESSION: 1. Marked interval decrease in size of the left upper lobe pulmonary nodule seen to be hypermetabolic on PET-CT of 33/82/5053. 2. Interval evolution of post treatment changes in the right lung consistent with prior radiation therapy. 3.  Emphysema. (ICD10-J43.9) 4.  Aortic Atherosclerois (ICD10-170.0) CXR 03/23/20- IMPRESSION: 1.  No active cardiopulmonary disease. 2. Post radiation changes in both upper lobes.  ROS-see HPI    + = positive Constitutional:  +  weight loss, night sweats, fevers, chills, fatigue, lassitude. HEENT:   No-  headaches, difficulty swallowing, tooth/dental problems, sore throat,       No-  sneezing, itching, ear ache, nasal congestion, post nasal drip,  CV:  No-   chest pain, +orthopnea, PND, +swelling in lower extremities, No-anasarca, dizziness, palpitations Resp: + shortness of breath with exertion or at rest.              No-   productive cough,  No non-productive cough,  No-  coughing  up of blood.              No-   change in color of mucus.  No- wheezing.   Skin: No-   rash or lesions. GI:  No-   heartburn, indigestion, abdominal pain, nausea, vomiting, GU: . MS:  + joint pain or swelling.  Neuro-     nothing unusual Psych:  No- change in mood or affect. No depression or anxiety.  No memory loss.  OBJ- Physical Exam   97% on O2 3L. +Wheelchair  General- Alert, Oriented, Affect-appropriate, Distress- none acute, + morbidly obese  Skin-+ ecchymoses on arms. Lymphadenopathy- none Head- atraumatic            Eyes- Gross vision intact, PERRLA, conjunctivae and secretions clear            Ears- Hearing, canals-normal            Nose- clear, no-Septal dev, mucus, polyps, erosion, perforation             Throat- Mallampati III, mucosa clear , drainage- none, tonsils- atrophic Neck- flexible , trachea midline, no stridor , thyroid nl, carotid no bruit Chest - symmetrical excursion , unlabored           Heart/CV- RRR , no murmur , no gallop  , no rub, nl s1 s2                           - JVD- none , edema -none, stasis changes- none, varices- none           Lung-+unlabored shallow inspiratory effort, rales- none, wheeze+, cough- none ,                          dullness-none, rub- none,            Chest wall- sternotomy scar,  Pacemaker defibrillator L Abd-  Br/ Gen/ Rectal- Not done, not indicated Extrem- cyanosis- none, clubbing, none, atrophy- none, strength- nl, vein donor scars Neuro- +head bob tremor

## 2020-06-06 ENCOUNTER — Other Ambulatory Visit (HOSPITAL_COMMUNITY): Payer: Self-pay | Admitting: Cardiology

## 2020-06-08 NOTE — Telephone Encounter (Signed)
Called and left message. Will be here to assist once return communication has been received.  Charlann Boxer, CPhT

## 2020-06-09 ENCOUNTER — Other Ambulatory Visit: Payer: Self-pay | Admitting: Internal Medicine

## 2020-06-15 ENCOUNTER — Other Ambulatory Visit (HOSPITAL_COMMUNITY): Payer: Self-pay | Admitting: Internal Medicine

## 2020-06-18 ENCOUNTER — Other Ambulatory Visit: Payer: Self-pay

## 2020-06-18 ENCOUNTER — Ambulatory Visit (HOSPITAL_COMMUNITY)
Admission: RE | Admit: 2020-06-18 | Discharge: 2020-06-18 | Disposition: A | Discharge status: Home / Self Care | Payer: Medicare Other | Source: Ambulatory Visit | Attending: Radiation Oncology | Admitting: Radiation Oncology

## 2020-06-18 DIAGNOSIS — R911 Solitary pulmonary nodule: Secondary | ICD-10-CM | POA: Insufficient documentation

## 2020-06-21 ENCOUNTER — Other Ambulatory Visit: Payer: Self-pay

## 2020-06-21 ENCOUNTER — Ambulatory Visit
Admission: RE | Admit: 2020-06-21 | Discharge: 2020-06-21 | Disposition: A | Discharge status: Home / Self Care | Payer: Medicare Other | Source: Ambulatory Visit | Attending: Radiation Oncology | Admitting: Radiation Oncology

## 2020-06-21 ENCOUNTER — Encounter: Payer: Self-pay | Admitting: Radiation Oncology

## 2020-06-21 VITALS — BP 116/76 | HR 78 | Temp 98.9°F | Resp 18 | Ht 68.0 in

## 2020-06-21 DIAGNOSIS — I509 Heart failure, unspecified: Secondary | ICD-10-CM | POA: Diagnosis not present

## 2020-06-21 DIAGNOSIS — Z79899 Other long term (current) drug therapy: Secondary | ICD-10-CM | POA: Insufficient documentation

## 2020-06-21 DIAGNOSIS — I700 Atherosclerosis of aorta: Secondary | ICD-10-CM | POA: Diagnosis not present

## 2020-06-21 DIAGNOSIS — I2510 Atherosclerotic heart disease of native coronary artery without angina pectoris: Secondary | ICD-10-CM | POA: Diagnosis not present

## 2020-06-21 DIAGNOSIS — Z9981 Dependence on supplemental oxygen: Secondary | ICD-10-CM | POA: Diagnosis not present

## 2020-06-21 DIAGNOSIS — J439 Emphysema, unspecified: Secondary | ICD-10-CM | POA: Insufficient documentation

## 2020-06-21 DIAGNOSIS — R911 Solitary pulmonary nodule: Secondary | ICD-10-CM | POA: Diagnosis not present

## 2020-06-21 DIAGNOSIS — K229 Disease of esophagus, unspecified: Secondary | ICD-10-CM | POA: Insufficient documentation

## 2020-06-21 DIAGNOSIS — C3481 Malignant neoplasm of overlapping sites of right bronchus and lung: Secondary | ICD-10-CM | POA: Diagnosis not present

## 2020-06-21 DIAGNOSIS — Z7901 Long term (current) use of anticoagulants: Secondary | ICD-10-CM | POA: Diagnosis not present

## 2020-06-21 DIAGNOSIS — K449 Diaphragmatic hernia without obstruction or gangrene: Secondary | ICD-10-CM | POA: Insufficient documentation

## 2020-06-21 DIAGNOSIS — Z08 Encounter for follow-up examination after completed treatment for malignant neoplasm: Secondary | ICD-10-CM | POA: Diagnosis not present

## 2020-06-21 DIAGNOSIS — R1110 Vomiting, unspecified: Secondary | ICD-10-CM | POA: Diagnosis not present

## 2020-06-21 NOTE — Progress Notes (Signed)
Patient states having lower back pain that is a 8 out of 10 on the pain scale. Patient states moderate fatigue. Patient states naps are needed to relieve fatigue. Patient denies having a cough or hemoptysis. Patient state shaving shortness of breath on exertion. Patient is on continuous oxygen/ 2 liters. Patient states difficult swallowing. Patient states food get stuck due to hiatal hernia. Patient states that it causes vomiting. Patient states no skin changes. Patient states that he is not currently on chemotherapy treatments.   BP 116/76 (BP Location: Left Arm, Patient Position: Sitting)    Pulse 78    Temp 98.9 F (37.2 C) (Oral)    Resp 18    Ht 5\' 8"  (1.727 m)    SpO2 99%    BMI 41.01 kg/m

## 2020-06-21 NOTE — Progress Notes (Signed)
Radiation Oncology         [phone] ________________________________  Name: [name] MRN: [id number]  Date: 06/21/2020  DOB: 1946-04-02  Follow-Up Visit Note  CC: [name], MD  [name], *    ICD-10-CM   1. Nodule of upper lobe of left lung  R91.1 CT Chest Wo Contrast    Diagnosis: PET positive pulmonary nodule in the left upper lobe  Interval Since Last Radiation: Ten months and two days.  08/14/2019 through 08/21/2019 Site Technique Total Dose Dose per Fx Completed Fx Beam Energies  Thorax: Lung_Lt SBRT 54/54 18 3/3 6XFFF   12/09/2018 - 01/20/2019: Right lung (retx) / 60 Gy in 30 fractions (IMRT)  2/25, 2/27, 02/08/18: Right lower lung / 54 Gy in 3 fractions(SBRT)  4/30, 5/3, 04/17/17: Rightupper lung/ 54 Gy in 3 fractions (SBRT)  Narrative:  The patient returns today for routine follow-up. He is doing well overall.  Since his last visit, he underwent a CT scan of chest on 06/18/2020 that showed stable post-radiation changes in the right lung. There was a new area of airspace disease in the posterior left upper lobe, which obscured the previously seen pulmonary nodule at that site, consistent with radiation pneumonitis. The mild diffuse esophageal wall thickening was stable and consistent with esophagitis. There was no evidence of lymphadenopathy or pleural effusion.  Of note, the patient is being followed by cardiology for history of heart failure.   On review of systems, he reports low back pain rated 8/10. He also reports moderate fatigue relieved with naps and shortness of breath with exertion. The patient is on continuous 2.5L supplemental oxygen. He also reports difficulty swallowing secondary to hiatal hernia that also causes vomiting. He denies cough, hemoptysis, and skin changes.  ALLERGIES:  is allergic to tussionex pennkinetic er [hydrocod polst-cpm polst er] and ace inhibitors.  Meds: Current Outpatient Medications  Medication Sig  Dispense Refill  . albuterol (PROVENTIL HFA;VENTOLIN HFA) 108 (90 BASE) MCG/ACT inhaler Inhale 2 puffs into the lungs every 6 (six) hours as needed for wheezing or shortness of breath. 3 Inhaler 4  . albuterol (PROVENTIL) (2.5 MG/3ML) 0.083% nebulizer solution USE 1 VIAL IN NEBULIZER 4 TIMES DAILY 75 mL 11  . amiodarone (PACERONE) 100 MG tablet Take 1 tablet (100 mg total) by mouth daily. 30 tablet 11  . atorvastatin (LIPITOR) 80 MG tablet TAKE 1 TABLET BY MOUTH EVERY DAY 90 tablet 2  . ATROVENT HFA 17 MCG/ACT inhaler TAKE 2 PUFFS BY MOUTH EVERY 4 HOURS AS NEEDED FOR WHEEZE  12  . carvedilol (COREG) 25 MG tablet Take 1 tablet (25 mg total) by mouth 2 (two) times daily with a meal. 180 tablet 1  . diazepam (VALIUM) 5 MG tablet Take 1 tablet (5 mg total) by mouth every 8 (eight) hours as needed for anxiety. 1-2 tabs every 8 hours if needed for tremor 30 tablet 5  . finasteride (PROSCAR) 5 MG tablet Take 5 mg by mouth daily.    . fluticasone (FLONASE) 50 MCG/ACT nasal spray Instill 1 spray in each  nostril daily 32 g 3  . Fluticasone-Umeclidin-Vilant (TRELEGY ELLIPTA) 100-62.5-25 MCG/INH AEPB Inhale 1 puff into the lungs daily. Rinse mouth 60 each 12  . Iron-FA-B Cmp-C-Biot-Probiotic (FUSION PLUS) CAPS Take 1 capsule by mouth daily. 90 capsule 1  . isosorbide mononitrate (IMDUR) 60 MG 24 hr tablet TAKE 1 TABLET BY MOUTH  DAILY 90 tablet 3  . KLOR-CON M20 20 MEQ tablet TAKE 1 TABLET  BY MOUTH EVERY DAY 90 tablet 3  . levothyroxine (SYNTHROID) 50 MCG tablet TAKE 1 TABLET BY MOUTH  DAILY BEFORE BREAKFAST 90 tablet 1  . nitroGLYCERIN (NITROSTAT) 0.4 MG SL tablet Place 1 tablet (0.4 mg total) under the tongue every 5 (five) minutes as needed for chest pain. 25 tablet 3  . NON FORMULARY at bedtime. CPAP And during the day as needed    . rivaroxaban (XARELTO) 20 MG TABS tablet Take 1 tablet (20 mg total) by mouth daily with supper. 30 tablet 5  . sacubitril-valsartan (ENTRESTO) 24-26 MG Take 1 tablet by  mouth 2 (two) times daily. 60 tablet 11  . spironolactone (ALDACTONE) 25 MG tablet TAKE 1 TABLET BY MOUTH  DAILY 90 tablet 3  . thiamine 100 MG tablet Take 1 tablet (100 mg total) by mouth every other day. 45 tablet 0  . torsemide (DEMADEX) 20 MG tablet Take 2 tablets (40 mg total) by mouth every morning AND 1 tablet (20 mg total) every evening. 150 tablet 5  . VASCEPA 1 g capsule TAKE 2 CAPSULES (2 G TOTAL) BY MOUTH 2 (TWO) TIMES A DAY. 120 capsule 11  . ezetimibe (ZETIA) 10 MG tablet Take 1 tablet (10 mg total) by mouth daily. 90 tablet 3   No current facility-administered medications for this encounter.    Physical Findings: The patient is in no acute distress. Patient is alert and oriented.  height is 5\' 8"  (1.727 m). His oral temperature is 98.9 F (37.2 C). His blood pressure is 116/76 and his pulse is 78. His respiration is 18 and oxygen saturation is 99%. .   Lungs are clear to auscultation bilaterally. Heart has regular rate and rhythm. No palpable cervical, supraclavicular, or axillary adenopathy. Abdomen soft, non-tender, normal bowel sounds.  No wheezing.  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 Previous radiation therapy. EXAM: CT CHEST WITHOUT CONTRAST TECHNIQUE: Multidetector CT imaging of the chest was performed following the standard protocol without IV contrast. COMPARISON:  12/19/2019 FINDINGS: Cardiovascular: No acute findings. 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 Aortic and coronary artery atherosclerosis noted. Prior CABG again noted. Mediastinum/Nodes: No masses or pathologically enlarged lymph nodes identified on this unenhanced exam. Mild diffuse esophageal wall thickening again  seen, consistent with esophagitis. Lungs/Pleura: Mild centrilobular emphysema again seen. Confluent opacity with central air bronchograms and fiducial markers again seen in the central right upper lobe, which is stable in appearance. Linear areas of pulmonary opacity are also seen in the medial right middle and lower lobes which are stable. These findings are consistent with post radiation changes. New area of airspace disease is seen in the posterior left upper lobe which contains central air bronchograms. This is seen in location of the previously demonstrated sub-cm pulmonary nodule, and is consistent with radiation pneumonitis. No evidence of pleural effusion. Upper Abdomen:  Unremarkable. Musculoskeletal:  No suspicious bone lesions. IMPRESSION: Stable post radiation changes in right lung. New area of airspace disease in posterior left upper lobe which obscures previously seen pulmonary nodule at this site, consistent with radiation pneumonitis. No evidence of lymphadenopathy or pleural effusion. Stable mild diffuse esophageal wall thickening, consistent with esophagitis. Aortic Atherosclerosis (  ICD10-I70.0) and Emphysema (ICD10-J43.9). Electronically Signed   By: Marlaine Hind M.D.   On: 06/19/2020 16:04    Impression:  PET positive pulmonary nodule in the left upper lobe  Recent chest CT scan is stable.  No new areas.  no clinical evidence of recurrence on exam today.  Recommended he follow-up with his primary care physician concerning his low back pain.  Plan: Routine follow-up in six months with repeat CT scan of chest prior to that visit.   Total time spent in this encounter was 20 minutes which included reviewing the patient's most recent chest CT scan, follow-ups, physical examination, and documentation. ____________________________________   Blair Promise, PhD, MD  This document serves as a record of services personally performed by Gery Pray, MD. It was created on his behalf by  Clerance Lav, a trained medical scribe. The creation of this record is based on the scribe's personal observations and the provider's statements to them. This document has been checked and approved by the attending provider.

## 2020-06-26 ENCOUNTER — Other Ambulatory Visit: Payer: Self-pay | Admitting: Internal Medicine

## 2020-06-26 DIAGNOSIS — E519 Thiamine deficiency, unspecified: Secondary | ICD-10-CM

## 2020-06-28 ENCOUNTER — Telehealth (HOSPITAL_COMMUNITY): Payer: Self-pay | Admitting: Pharmacy Technician

## 2020-06-28 ENCOUNTER — Telehealth (HOSPITAL_COMMUNITY): Payer: Self-pay | Admitting: Cardiology

## 2020-06-28 NOTE — Telephone Encounter (Signed)
Spoke to patient's wife and informed her that the email that she sent me contained no information. I am going to send her the application for Xarelto assistance again.  Will follow up.

## 2020-06-28 NOTE — Telephone Encounter (Signed)
Pharmacy team reviewed need to start patient assistance for farxiga   Per Dr Aundra Dubin on 04/30/20 Cr 2.77 BUN 86 K 4.8 Na 136 -Stop Entresto for 2 days, decrease dose to 24/26 bid after that. Stop torsemide for 2 days, decrease to 40 qam/20 qpm after that. Do not start dapagliflozin yet. Needs BMET early next week and will need followup with NP/PA in 10-14 days.   Repeat labs 05/11/20 Cr 1.69 BUN 62 K 4.4 Na 141  -Creatinine better.    Above reviewed with Dr Aundra Dubin- ok to start farxiga

## 2020-06-29 ENCOUNTER — Telehealth (HOSPITAL_COMMUNITY): Payer: Self-pay | Admitting: Pharmacist

## 2020-06-29 MED ORDER — DAPAGLIFLOZIN PROPANEDIOL 10 MG PO TABS
10.0000 mg | ORAL_TABLET | Freq: Every day | ORAL | 11 refills | Status: AC
Start: 2020-06-29 — End: ?

## 2020-06-29 NOTE — Telephone Encounter (Signed)
Per [name], patient will start Farxiga 10 mg daily. Prescription sent to [facility].  Audry Riles, PharmD, BCPS, BCCP, CPP [facility] Failure Clinic Pharmacist [phone]

## 2020-06-29 NOTE — Telephone Encounter (Signed)
After confirming, we are going to start the patient on Farxiga instead of Jardiance. [place] copay is $28.50 for a 30 day supply. Will update the patient's wife as well.

## 2020-07-01 ENCOUNTER — Other Ambulatory Visit (HOSPITAL_COMMUNITY): Payer: Self-pay | Admitting: Cardiology

## 2020-07-12 ENCOUNTER — Other Ambulatory Visit: Payer: Self-pay | Admitting: Cardiology

## 2020-07-14 ENCOUNTER — Ambulatory Visit (INDEPENDENT_AMBULATORY_CARE_PROVIDER_SITE_OTHER): Payer: Medicare Other

## 2020-07-14 ENCOUNTER — Encounter: Payer: Self-pay | Admitting: Internal Medicine

## 2020-07-14 ENCOUNTER — Other Ambulatory Visit: Payer: Self-pay

## 2020-07-14 ENCOUNTER — Ambulatory Visit (INDEPENDENT_AMBULATORY_CARE_PROVIDER_SITE_OTHER): Payer: Medicare Other | Admitting: Internal Medicine

## 2020-07-14 VITALS — BP 110/58 | HR 71 | Temp 98.1°F | Ht 68.0 in | Wt 265.2 lb

## 2020-07-14 DIAGNOSIS — R7303 Prediabetes: Secondary | ICD-10-CM | POA: Diagnosis not present

## 2020-07-14 DIAGNOSIS — E519 Thiamine deficiency, unspecified: Secondary | ICD-10-CM | POA: Diagnosis not present

## 2020-07-14 DIAGNOSIS — E039 Hypothyroidism, unspecified: Secondary | ICD-10-CM | POA: Diagnosis not present

## 2020-07-14 DIAGNOSIS — D508 Other iron deficiency anemias: Secondary | ICD-10-CM

## 2020-07-14 DIAGNOSIS — M47816 Spondylosis without myelopathy or radiculopathy, lumbar region: Secondary | ICD-10-CM | POA: Diagnosis not present

## 2020-07-14 DIAGNOSIS — M545 Low back pain, unspecified: Secondary | ICD-10-CM

## 2020-07-14 DIAGNOSIS — D510 Vitamin B12 deficiency anemia due to intrinsic factor deficiency: Secondary | ICD-10-CM

## 2020-07-14 DIAGNOSIS — I255 Ischemic cardiomyopathy: Secondary | ICD-10-CM | POA: Diagnosis not present

## 2020-07-14 DIAGNOSIS — G8929 Other chronic pain: Secondary | ICD-10-CM | POA: Diagnosis not present

## 2020-07-14 DIAGNOSIS — I714 Abdominal aortic aneurysm, without rupture, unspecified: Secondary | ICD-10-CM

## 2020-07-14 DIAGNOSIS — N1831 Chronic kidney disease, stage 3a: Secondary | ICD-10-CM

## 2020-07-14 DIAGNOSIS — M48061 Spinal stenosis, lumbar region without neurogenic claudication: Secondary | ICD-10-CM | POA: Diagnosis not present

## 2020-07-14 DIAGNOSIS — M5136 Other intervertebral disc degeneration, lumbar region: Secondary | ICD-10-CM | POA: Diagnosis not present

## 2020-07-14 DIAGNOSIS — E785 Hyperlipidemia, unspecified: Secondary | ICD-10-CM

## 2020-07-14 MED ORDER — TRAMADOL HCL 50 MG PO TABS: 50.0000 mg | ORAL_TABLET | Freq: Four times a day (QID) | ORAL | 3 refills | Status: AC | PRN

## 2020-07-14 NOTE — Patient Instructions (Signed)
Acute Back Pain, Adult Acute back pain is sudden and usually short-lived. It is often caused by an injury to the muscles and tissues in the back. The injury may result from:  A muscle or ligament getting overstretched or torn (strained). Ligaments are tissues that connect bones to each other. Lifting something improperly can cause a back strain.  Wear and tear (degeneration) of the spinal disks. Spinal disks are circular tissue that provides cushioning between the bones of the spine (vertebrae).  Twisting motions, such as while playing sports or doing yard work.  A hit to the back.  Arthritis. You may have a physical exam, lab tests, and imaging tests to find the cause of your pain. Acute back pain usually goes away with rest and home care. Follow these instructions at home: Managing pain, stiffness, and swelling  Take over-the-counter and prescription medicines only as told by your health care provider.  Your health care provider may recommend applying ice during the first 24-48 hours after your pain starts. To do this: ? Put ice in a plastic bag. ? Place a towel between your skin and the bag. ? Leave the ice on for 20 minutes, 2-3 times a day.  If directed, apply heat to the affected area as often as told by your health care provider. Use the heat source that your health care provider recommends, such as a moist heat pack or a heating pad. ? Place a towel between your skin and the heat source. ? Leave the heat on for 20-30 minutes. ? Remove the heat if your skin turns bright red. This is especially important if you are unable to feel pain, heat, or cold. You have a greater risk of getting burned. Activity   Do not stay in bed. Staying in bed for more than 1-2 days can delay your recovery.  Sit up and stand up straight. Avoid leaning forward when you sit, or hunching over when you stand. ? If you work at a desk, sit close to it so you do not need to lean over. Keep your chin tucked  in. Keep your neck drawn back, and keep your elbows bent at a right angle. Your arms should look like the letter "L." ? Sit high and close to the steering wheel when you drive. Add lower back (lumbar) support to your car seat, if needed.  Take short walks on even surfaces as soon as you are able. Try to increase the length of time you walk each day.  Do not sit, drive, or stand in one place for more than 30 minutes at a time. Sitting or standing for long periods of time can put stress on your back.  Do not drive or use heavy machinery while taking prescription pain medicine.  Use proper lifting techniques. When you bend and lift, use positions that put less stress on your back: ? Bend your knees. ? Keep the load close to your body. ? Avoid twisting.  Exercise regularly as told by your health care provider. Exercising helps your back heal faster and helps prevent back injuries by keeping muscles strong and flexible.  Work with a physical therapist to make a safe exercise program, as recommended by your health care provider. Do any exercises as told by your physical therapist. Lifestyle  Maintain a healthy weight. Extra weight puts stress on your back and makes it difficult to have good posture.  Avoid activities or situations that make you feel anxious or stressed. Stress and anxiety increase muscle   tension and can make back pain worse. Learn ways to manage anxiety and stress, such as through exercise. General instructions  Sleep on a firm mattress in a comfortable position. Try lying on your side with your knees slightly bent. If you lie on your back, put a pillow under your knees.  Follow your treatment plan as told by your health care provider. This may include: ? Cognitive or behavioral therapy. ? Acupuncture or massage therapy. ? Meditation or yoga. Contact a health care provider if:  You have pain that is not relieved with rest or medicine.  You have increasing pain going down  into your legs or buttocks.  Your pain does not improve after 2 weeks.  You have pain at night.  You lose weight without trying.  You have a fever or chills. Get help right away if:  You develop new bowel or bladder control problems.  You have unusual weakness or numbness in your arms or legs.  You develop nausea or vomiting.  You develop abdominal pain.  You feel faint. Summary  Acute back pain is sudden and usually short-lived.  Use proper lifting techniques. When you bend and lift, use positions that put less stress on your back.  Take over-the-counter and prescription medicines and apply heat or ice as directed by your health care provider. This information is not intended to replace advice given to you by your health care provider. Make sure you discuss any questions you have with your health care provider. Document Revised: 03/18/2019 Document Reviewed: 07/11/2017 [employer] Patient Education  2020 [employer]  

## 2020-07-14 NOTE — Progress Notes (Signed)
Subjective:  Patient ID: CAIDON FOTI, male    DOB: [birth year]  Age: 74 y.o. MRN: [id number]  CC: Back Pain, Anemia, Diabetes, and Hyperlipidemia  This visit occurred during the SARS-CoV-2 public health emergency.  Safety protocols were in place, including screening questions prior to the visit, additional usage of staff PPE, and extensive cleaning of exam room while observing appropriate contact time as indicated for disinfecting solutions.    HPI [name] presents for f/up - He complains of a 37-month history of low back pain.  At times the pain is 7 out of 10.  The pain does not radiate towards his lower extremity and he denies paresthesias.  He has not gotten much symptom relief with Tylenol.  He is also due for follow-up on anemia.  He is not aware of any sources of blood loss.  Outpatient Medications Prior to Visit  Medication Sig Dispense Refill  . albuterol (PROVENTIL HFA;VENTOLIN HFA) 108 (90 BASE) MCG/ACT inhaler Inhale 2 puffs into the lungs every 6 (six) hours as needed for wheezing or shortness of breath. 3 Inhaler 4  . albuterol (PROVENTIL) (2.5 MG/3ML) 0.083% nebulizer solution USE 1 VIAL IN NEBULIZER 4 TIMES DAILY 75 mL 11  . amiodarone (PACERONE) 100 MG tablet Take 1 tablet (100 mg total) by mouth daily. 30 tablet 11  . atorvastatin (LIPITOR) 80 MG tablet TAKE 1 TABLET BY MOUTH EVERY DAY 90 tablet 2  . ATROVENT HFA 17 MCG/ACT inhaler TAKE 2 PUFFS BY MOUTH EVERY 4 HOURS AS NEEDED FOR WHEEZE  12  . carvedilol (COREG) 25 MG tablet Take 1 tablet (25 mg total) by mouth 2 (two) times daily with a meal. 180 tablet 1  . dapagliflozin propanediol (FARXIGA) 10 MG TABS tablet Take 1 tablet (10 mg total) by mouth daily before breakfast. 30 tablet 11  . diazepam (VALIUM) 5 MG tablet Take 1 tablet (5 mg total) by mouth every 8 (eight) hours as needed for anxiety. 1-2 tabs every 8 hours if needed for tremor 30 tablet 5  . ezetimibe (ZETIA) 10 MG tablet Take 1 tablet (10 mg total) by  mouth daily. 90 tablet 3  . finasteride (PROSCAR) 5 MG tablet Take 5 mg by mouth daily.    . fluticasone (FLONASE) 50 MCG/ACT nasal spray Instill 1 spray in each  nostril daily 32 g 3  . Fluticasone-Umeclidin-Vilant (TRELEGY ELLIPTA) 100-62.5-25 MCG/INH AEPB Inhale 1 puff into the lungs daily. Rinse mouth 60 each 12  . Iron-FA-B Cmp-C-Biot-Probiotic (FUSION PLUS) CAPS Take 1 capsule by mouth daily. 90 capsule 1  . isosorbide mononitrate (IMDUR) 60 MG 24 hr tablet TAKE 1 TABLET BY MOUTH  DAILY 90 tablet 3  . KLOR-CON M20 20 MEQ tablet TAKE 1 TABLET BY MOUTH EVERY DAY 90 tablet 3  . levothyroxine (SYNTHROID) 50 MCG tablet TAKE 1 TABLET BY MOUTH  DAILY BEFORE BREAKFAST 90 tablet 1  . nitroGLYCERIN (NITROSTAT) 0.4 MG SL tablet Place 1 tablet (0.4 mg total) under the tongue every 5 (five) minutes as needed for chest pain. 25 tablet 3  . NON FORMULARY at bedtime. CPAP And during the day as needed    . sacubitril-valsartan (ENTRESTO) 24-26 MG Take 1 tablet by mouth 2 (two) times daily. 60 tablet 11  . spironolactone (ALDACTONE) 25 MG tablet TAKE 1 TABLET BY MOUTH  DAILY 90 tablet 3  . torsemide (DEMADEX) 20 MG tablet Take 2 tablets (40 mg total) by mouth every morning AND 1 tablet (20 mg total) every evening.  150 tablet 5  . VASCEPA 1 g capsule TAKE 2 CAPSULES (2 G TOTAL) BY MOUTH 2 (TWO) TIMES A DAY. 120 capsule 11  . XARELTO 20 MG TABS tablet TAKE 1 TABLET (20 MG TOTAL) BY MOUTH DAILY WITH SUPPER. STOP ASPIRIN AND CLOPIDIGREL 90 tablet 3  . thiamine 100 MG tablet TAKE 1 TABLET BY MOUTH EVERY OTHER DAY 45 tablet 0   No facility-administered medications prior to visit.    ROS Review of Systems  Constitutional: Negative for appetite change, chills, diaphoresis, fatigue and fever.  HENT: Negative.   Eyes: Negative for visual disturbance.  Respiratory: Negative for cough, chest tightness, shortness of breath and wheezing.   Cardiovascular: Negative for chest pain, palpitations and leg swelling.    Gastrointestinal: Negative for abdominal pain, constipation, diarrhea, nausea and vomiting.  Endocrine: Negative for cold intolerance and heat intolerance.  Genitourinary: Negative.  Negative for difficulty urinating.  Musculoskeletal: Positive for back pain. Negative for arthralgias and neck pain.  Skin: Negative for color change, pallor and rash.  Neurological: Negative.  Negative for dizziness, weakness, light-headedness, numbness and headaches.  Hematological: Negative for adenopathy. Does not bruise/bleed easily.  Psychiatric/Behavioral: Negative.     Objective:  BP (!) 110/58 (BP Location: Left Arm, Patient Position: Sitting, Cuff Size: Large)   Pulse 71   Temp 98.1 F (36.7 C) (Oral)   Ht 5\' 8"  (1.727 m)   Wt 265 lb 4 oz (120.3 kg)   SpO2 97%   BMI 40.33 kg/m   BP Readings from Last 3 Encounters:  07/14/20 (!) 110/58  06/21/20 116/76  05/31/20 122/72    Wt Readings from Last 3 Encounters:  07/14/20 265 lb 4 oz (120.3 kg)  05/31/20 269 lb 11.2 oz (122.3 kg)  04/30/20 271 lb 12.8 oz (123.3 kg)    Physical Exam Vitals reviewed.  Constitutional:      Appearance: He is obese.  HENT:     Nose: Nose normal.     Mouth/Throat:     Mouth: Mucous membranes are moist.  Eyes:     General: No scleral icterus.    Conjunctiva/sclera: Conjunctivae normal.  Cardiovascular:     Rate and Rhythm: Normal rate and regular rhythm.     Heart sounds: No murmur heard.   Pulmonary:     Effort: Pulmonary effort is normal.     Breath sounds: No stridor. No wheezing, rhonchi or rales.  Abdominal:     General: Abdomen is protuberant. Bowel sounds are normal. There is no distension.     Palpations: Abdomen is soft. There is no hepatomegaly, splenomegaly or mass.     Tenderness: There is no abdominal tenderness.  Musculoskeletal:     Cervical back: Normal and neck supple.     Thoracic back: Normal.     Lumbar back: Tenderness present. No deformity or bony tenderness. Decreased range  of motion. Negative right straight leg raise test and negative left straight leg raise test.     Right lower leg: No edema.     Left lower leg: No edema.  Lymphadenopathy:     Cervical: No cervical adenopathy.  Skin:    General: Skin is warm and dry.     Coloration: Skin is not pale.  Neurological:     General: No focal deficit present.     Mental Status: He is alert.     Cranial Nerves: Cranial nerves are intact.     Sensory: Sensation is intact.     Motor: Motor function is  intact. No weakness or atrophy.     Coordination: Coordination is intact.     Gait: Gait is intact.     Deep Tendon Reflexes: Reflexes normal.     Reflex Scores:      Tricep reflexes are 0 on the right side and 0 on the left side.      Bicep reflexes are 0 on the right side and 0 on the left side.      Brachioradialis reflexes are 0 on the right side and 0 on the left side.      Patellar reflexes are 0 on the right side and 0 on the left side.      Achilles reflexes are 0 on the right side and 0 on the left side.    Lab Results  Component Value Date   WBC 7.1 07/14/2020   HGB 12.9 (L) 07/14/2020   HCT 41.6 07/14/2020   PLT 156 07/14/2020   GLUCOSE 116 (H) 07/14/2020   CHOL 95 07/14/2020   TRIG 151 (H) 07/14/2020   HDL 30 (L) 07/14/2020   LDLCALC 42 07/14/2020   ALT 15 04/30/2020   AST 16 04/30/2020   NA 139 07/14/2020   K 4.5 07/14/2020   CL 104 07/14/2020   CREATININE 1.66 (H) 07/14/2020   BUN 46 (H) 07/14/2020   CO2 28 07/14/2020   TSH 3.34 07/14/2020   PSA 0.51 04/18/2012   INR 1.01 12/28/2017   HGBA1C 5.5 07/14/2020    CLINICAL DATA:  Chronic low back pain  EXAM: LUMBAR SPINE - COMPLETE 4+ VIEW  COMPARISON:  None.  FINDINGS: Frontal, lateral, spot lumbosacral lateral, and bilateral oblique views were obtained. There are 5 non-rib-bearing lumbar type vertebral bodies. No fracture or spondylolisthesis. There is moderate disc space narrowing at L4-5. Other disc spaces  appear unremarkable. There is facet osteoarthritic change at L5-S1 on the left and to a lesser extent at L4-5 bilaterally and at L5-S1 on the right. There is a stent in the distal aorta and common iliac artery regions.  IMPRESSION: Moderate disc space narrowing at L4-5. 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 No fracture or spondylolisthesis. Aortic and common iliac artery stent present.   Electronically Signed   By: Lowella Grip III M.D.   On: 07/14/2020 11:31  Assessment & Plan:   Ashok was seen today for back pain, anemia, diabetes and hyperlipidemia.  Diagnoses and all orders for this visit:  Acquired hypothyroidism- His TSH is in the normal range.  He will stay on the current dose of levothyroxine. -     TSH; Future -     TSH  Other iron deficiency anemia- He is mildly anemic and his iron level remains slightly low.  I recommended that he continue taking the iron supplement. -     CBC with Differential/Platelet; Future -     Iron; Future -     Ferritin; Future -     Ferritin -     Iron -     CBC with Differential/Platelet  Thiamine deficiency- His thiamine level is too high.  I recommended that he stop taking the thiamine supplement. -     CBC with Differential/Platelet; Future -     Vitamin B1; Future -     Vitamin B1 -     CBC with Differential/Platelet  Vitamin B12 deficiency anemia due to intrinsic factor deficiency- His B12 level is normal. -     CBC with Differential/Platelet;  Future -     Vitamin B12; Future -     Folate; Future -     Folate -     Vitamin B12 -     CBC with Differential/Platelet  Hyperlipidemia with target LDL less than 70- He has achieved his LDL goal. -     Lipid panel; Future -     Lipid panel  Prediabetes -     BASIC METABOLIC PANEL WITH GFR; Future -     Hemoglobin A1c; Future -     Hemoglobin A1c -     BASIC METABOLIC PANEL WITH GFR  Chronic bilateral low back pain without  sciatica- Based on his symptoms, exam, and x-ray he is has degenerative disc disease.  NSAIDs are contraindicated.  Will treat this with tramadol. -     DG Lumbar Spine Complete; Future -     traMADol (ULTRAM) 50 MG tablet; Take 1 tablet (50 mg total) by mouth every 6 (six) hours as needed.  DDD (degenerative disc disease), lumbar- See above -     traMADol (ULTRAM) 50 MG tablet; Take 1 tablet (50 mg total) by mouth every 6 (six) hours as needed.  AAA (abdominal aortic aneurysm) without rupture ([place])- He is due for a f/up on this. -     VAS [place] AAA DUPLEX; Future  Stage 3a chronic kidney disease- His blood pressure is adequately well controlled.  Will avoid nephrotoxic agents.   I have discontinued Oracio L. Gidney's thiamine. I am also having him start on traMADol. Additionally, I am having him maintain his NON FORMULARY, albuterol, fluticasone, Atrovent HFA, atorvastatin, finasteride, albuterol, carvedilol, Vascepa, ezetimibe, diazepam, Fusion Plus, nitroGLYCERIN, amiodarone, torsemide, Entresto, levothyroxine, isosorbide mononitrate, spironolactone, Trelegy Ellipta, Klor-Con M20, dapagliflozin propanediol, and Xarelto.  Meds ordered this encounter  Medications  . traMADol (ULTRAM) 50 MG tablet    Sig: Take 1 tablet (50 mg total) by mouth every 6 (six) hours as needed.    Dispense:  75 tablet    Refill:  3   I spent 50 minutes in preparing to see the patient by review of recent labs, imaging and procedures, obtaining and reviewing separately obtained history, communicating with the patient and family or caregiver, ordering medications, tests or procedures, and documenting clinical information in the EHR including the differential Dx, treatment, and any further evaluation and other management of 1. Acquired hypothyroidism 2. Other iron deficiency anemia 3. Thiamine deficiency 4. Vitamin B12 deficiency anemia due to intrinsic factor deficiency 5. Hyperlipidemia with target LDL less than  70 6. Prediabetes 7. Chronic bilateral low back pain without sciatica 8. DDD (degenerative disc disease), lumbar 9. AAA (abdominal aortic aneurysm) without rupture ([place]) 10. Stage 3a chronic kidney disease     Follow-up: Return in about 3 months (around 10/14/2020).  [name], MD

## 2020-07-15 LAB — IRON: Iron: 56 ug/dL (ref 50–180)

## 2020-07-19 LAB — CBC WITH DIFFERENTIAL/PLATELET
Absolute Monocytes: 717 cells/uL (ref 200–950)
Basophils Absolute: 28 cells/uL (ref 0–200)
Basophils Relative: 0.4 %
Eosinophils Absolute: 320 cells/uL (ref 15–500)
Eosinophils Relative: 4.5 %
HCT: 41.6 % (ref 38.5–50.0)
Hemoglobin: 12.9 g/dL — ABNORMAL LOW (ref 13.2–17.1)
Lymphs Abs: 959 cells/uL (ref 850–3900)
MCH: 29.6 pg (ref 27.0–33.0)
MCHC: 31 g/dL — ABNORMAL LOW (ref 32.0–36.0)
MCV: 95.4 fL (ref 80.0–100.0)
MPV: 11.2 fL (ref 7.5–12.5)
Monocytes Relative: 10.1 %
Neutro Abs: 5077 cells/uL (ref 1500–7800)
Neutrophils Relative %: 71.5 %
Platelets: 156 10*3/uL (ref 140–400)
RBC: 4.36 10*6/uL (ref 4.20–5.80)
RDW: 12.9 % (ref 11.0–15.0)
Total Lymphocyte: 13.5 %
WBC: 7.1 10*3/uL (ref 3.8–10.8)

## 2020-07-19 LAB — LIPID PANEL
Cholesterol: 95 mg/dL (ref <200)
HDL: 30 mg/dL — ABNORMAL LOW (ref >=40)
LDL Cholesterol (Calc): 42 mg/dL (calc)
Non-HDL Cholesterol (Calc): 65 mg/dL (calc) (ref <130)
Total CHOL/HDL Ratio: 3.2 (calc) (ref <5.0)
Triglycerides: 151 mg/dL — ABNORMAL HIGH (ref <150)

## 2020-07-19 LAB — BASIC METABOLIC PANEL WITH GFR
BUN/Creatinine Ratio: 28 (calc) — ABNORMAL HIGH (ref 6–22)
BUN: 46 mg/dL — ABNORMAL HIGH (ref 7–25)
CO2: 28 mmol/L (ref 20–32)
Calcium: 8.8 mg/dL (ref 8.6–10.3)
Chloride: 104 mmol/L (ref 98–110)
Creat: 1.66 mg/dL — ABNORMAL HIGH (ref 0.70–1.18)
GFR, Est African American: 46 mL/min/{1.73_m2} — ABNORMAL LOW (ref >=60)
GFR, Est Non African American: 40 mL/min/{1.73_m2} — ABNORMAL LOW (ref >=60)
Glucose, Bld: 116 mg/dL — ABNORMAL HIGH (ref 65–99)
Potassium: 4.5 mmol/L (ref 3.5–5.3)
Sodium: 139 mmol/L (ref 135–146)

## 2020-07-19 LAB — TSH: TSH: 3.34 mIU/L (ref 0.40–4.50)

## 2020-07-19 LAB — FERRITIN: Ferritin: 19 ng/mL — ABNORMAL LOW (ref 24–380)

## 2020-07-19 LAB — HEMOGLOBIN A1C
Hgb A1c MFr Bld: 5.5 % of total Hgb (ref <5.7)
Mean Plasma Glucose: 111 (calc)
eAG (mmol/L): 6.2 (calc)

## 2020-07-19 LAB — FOLATE: Folate: 7.4 ng/mL

## 2020-07-19 LAB — VITAMIN B1: Vitamin B1 (Thiamine): 113 nmol/L — ABNORMAL HIGH (ref 8–30)

## 2020-07-19 LAB — VITAMIN B12: Vitamin B-12: 244 pg/mL (ref 200–1100)

## 2020-07-22 NOTE — Telephone Encounter (Signed)
Called patient's wife, left her a message regarding Wilder Glade and to check the status of the patient's Xarelto assistance application.  Charlann Boxer, CPhT

## 2020-07-25 ENCOUNTER — Other Ambulatory Visit: Payer: Self-pay | Admitting: Student

## 2020-07-25 DIAGNOSIS — I2510 Atherosclerotic heart disease of native coronary artery without angina pectoris: Secondary | ICD-10-CM

## 2020-07-28 DIAGNOSIS — G453 Amaurosis fugax: Secondary | ICD-10-CM | POA: Diagnosis not present

## 2020-07-28 DIAGNOSIS — Z961 Presence of intraocular lens: Secondary | ICD-10-CM | POA: Diagnosis not present

## 2020-07-28 DIAGNOSIS — H353112 Nonexudative age-related macular degeneration, right eye, intermediate dry stage: Secondary | ICD-10-CM | POA: Diagnosis not present

## 2020-07-28 DIAGNOSIS — H353111 Nonexudative age-related macular degeneration, right eye, early dry stage: Secondary | ICD-10-CM | POA: Diagnosis not present

## 2020-07-29 ENCOUNTER — Encounter: Payer: Self-pay | Admitting: Internal Medicine

## 2020-08-04 ENCOUNTER — Other Ambulatory Visit: Payer: Self-pay | Admitting: Internal Medicine

## 2020-08-04 ENCOUNTER — Ambulatory Visit (HOSPITAL_COMMUNITY)
Admission: RE | Admit: 2020-08-04 | Discharge: 2020-08-04 | Disposition: A | Discharge status: Home / Self Care | Payer: Medicare Other | Source: Ambulatory Visit | Attending: Internal Medicine | Admitting: Internal Medicine

## 2020-08-04 ENCOUNTER — Other Ambulatory Visit: Payer: Self-pay

## 2020-08-04 DIAGNOSIS — I714 Abdominal aortic aneurysm, without rupture, unspecified: Secondary | ICD-10-CM

## 2020-08-06 ENCOUNTER — Encounter (HOSPITAL_COMMUNITY): Payer: Self-pay | Admitting: Cardiology

## 2020-08-06 ENCOUNTER — Other Ambulatory Visit: Payer: Self-pay

## 2020-08-06 ENCOUNTER — Ambulatory Visit (HOSPITAL_COMMUNITY)
Admission: RE | Admit: 2020-08-06 | Discharge: 2020-08-06 | Disposition: A | Discharge status: Home / Self Care | Payer: Medicare Other | Source: Ambulatory Visit | Attending: Cardiology | Admitting: Cardiology

## 2020-08-06 VITALS — BP 104/52 | HR 70 | Wt 267.8 lb

## 2020-08-06 DIAGNOSIS — N400 Enlarged prostate without lower urinary tract symptoms: Secondary | ICD-10-CM | POA: Diagnosis not present

## 2020-08-06 DIAGNOSIS — G453 Amaurosis fugax: Secondary | ICD-10-CM | POA: Diagnosis not present

## 2020-08-06 DIAGNOSIS — I480 Paroxysmal atrial fibrillation: Secondary | ICD-10-CM | POA: Diagnosis not present

## 2020-08-06 DIAGNOSIS — I5042 Chronic combined systolic (congestive) and diastolic (congestive) heart failure: Secondary | ICD-10-CM | POA: Diagnosis not present

## 2020-08-06 DIAGNOSIS — I252 Old myocardial infarction: Secondary | ICD-10-CM | POA: Diagnosis not present

## 2020-08-06 DIAGNOSIS — I5022 Chronic systolic (congestive) heart failure: Secondary | ICD-10-CM | POA: Insufficient documentation

## 2020-08-06 DIAGNOSIS — Z95810 Presence of automatic (implantable) cardiac defibrillator: Secondary | ICD-10-CM | POA: Insufficient documentation

## 2020-08-06 DIAGNOSIS — I493 Ventricular premature depolarization: Secondary | ICD-10-CM | POA: Diagnosis not present

## 2020-08-06 DIAGNOSIS — I739 Peripheral vascular disease, unspecified: Secondary | ICD-10-CM

## 2020-08-06 DIAGNOSIS — E1151 Type 2 diabetes mellitus with diabetic peripheral angiopathy without gangrene: Secondary | ICD-10-CM | POA: Insufficient documentation

## 2020-08-06 DIAGNOSIS — Z6841 Body Mass Index (BMI) 40.0 and over, adult: Secondary | ICD-10-CM | POA: Diagnosis not present

## 2020-08-06 DIAGNOSIS — R911 Solitary pulmonary nodule: Secondary | ICD-10-CM | POA: Insufficient documentation

## 2020-08-06 DIAGNOSIS — I779 Disorder of arteries and arterioles, unspecified: Secondary | ICD-10-CM | POA: Diagnosis not present

## 2020-08-06 DIAGNOSIS — J449 Chronic obstructive pulmonary disease, unspecified: Secondary | ICD-10-CM

## 2020-08-06 DIAGNOSIS — I110 Hypertensive heart disease with heart failure: Secondary | ICD-10-CM | POA: Insufficient documentation

## 2020-08-06 DIAGNOSIS — Z87891 Personal history of nicotine dependence: Secondary | ICD-10-CM | POA: Insufficient documentation

## 2020-08-06 DIAGNOSIS — G4733 Obstructive sleep apnea (adult) (pediatric): Secondary | ICD-10-CM | POA: Diagnosis not present

## 2020-08-06 DIAGNOSIS — Z7984 Long term (current) use of oral hypoglycemic drugs: Secondary | ICD-10-CM | POA: Insufficient documentation

## 2020-08-06 DIAGNOSIS — Z85118 Personal history of other malignant neoplasm of bronchus and lung: Secondary | ICD-10-CM | POA: Diagnosis not present

## 2020-08-06 DIAGNOSIS — Z951 Presence of aortocoronary bypass graft: Secondary | ICD-10-CM | POA: Insufficient documentation

## 2020-08-06 DIAGNOSIS — Z923 Personal history of irradiation: Secondary | ICD-10-CM | POA: Insufficient documentation

## 2020-08-06 DIAGNOSIS — I255 Ischemic cardiomyopathy: Secondary | ICD-10-CM | POA: Insufficient documentation

## 2020-08-06 DIAGNOSIS — R251 Tremor, unspecified: Secondary | ICD-10-CM | POA: Diagnosis not present

## 2020-08-06 DIAGNOSIS — Z801 Family history of malignant neoplasm of trachea, bronchus and lung: Secondary | ICD-10-CM | POA: Insufficient documentation

## 2020-08-06 DIAGNOSIS — E039 Hypothyroidism, unspecified: Secondary | ICD-10-CM

## 2020-08-06 DIAGNOSIS — Z8349 Family history of other endocrine, nutritional and metabolic diseases: Secondary | ICD-10-CM | POA: Insufficient documentation

## 2020-08-06 DIAGNOSIS — K219 Gastro-esophageal reflux disease without esophagitis: Secondary | ICD-10-CM | POA: Insufficient documentation

## 2020-08-06 DIAGNOSIS — Z79899 Other long term (current) drug therapy: Secondary | ICD-10-CM | POA: Insufficient documentation

## 2020-08-06 DIAGNOSIS — Z8249 Family history of ischemic heart disease and other diseases of the circulatory system: Secondary | ICD-10-CM | POA: Insufficient documentation

## 2020-08-06 DIAGNOSIS — Z79890 Hormone replacement therapy (postmenopausal): Secondary | ICD-10-CM | POA: Insufficient documentation

## 2020-08-06 DIAGNOSIS — I2510 Atherosclerotic heart disease of native coronary artery without angina pectoris: Secondary | ICD-10-CM | POA: Insufficient documentation

## 2020-08-06 DIAGNOSIS — Z7901 Long term (current) use of anticoagulants: Secondary | ICD-10-CM | POA: Insufficient documentation

## 2020-08-06 DIAGNOSIS — E785 Hyperlipidemia, unspecified: Secondary | ICD-10-CM | POA: Diagnosis not present

## 2020-08-06 LAB — COMPREHENSIVE METABOLIC PANEL
ALT: 13 U/L (ref 0–44)
AST: 13 U/L — ABNORMAL LOW (ref 15–41)
Albumin: 3.6 g/dL (ref 3.5–5.0)
Alkaline Phosphatase: 65 U/L (ref 38–126)
Anion gap: 9 (ref 5–15)
BUN: 34 mg/dL — ABNORMAL HIGH (ref 8–23)
CO2: 24 mmol/L (ref 22–32)
Calcium: 8.5 mg/dL — ABNORMAL LOW (ref 8.9–10.3)
Chloride: 106 mmol/L (ref 98–111)
Creatinine, Ser: 1.45 mg/dL — ABNORMAL HIGH (ref 0.61–1.24)
GFR calc Af Amer: 54 mL/min — ABNORMAL LOW (ref >60)
GFR calc non Af Amer: 47 mL/min — ABNORMAL LOW (ref >60)
Glucose, Bld: 111 mg/dL — ABNORMAL HIGH (ref 70–99)
Potassium: 4.2 mmol/L (ref 3.5–5.1)
Sodium: 139 mmol/L (ref 135–145)
Total Bilirubin: 0.5 mg/dL (ref 0.3–1.2)
Total Protein: 6.1 g/dL — ABNORMAL LOW (ref 6.5–8.1)

## 2020-08-06 LAB — TSH: TSH: 3.623 u[IU]/mL (ref 0.350–4.500)

## 2020-08-06 NOTE — Patient Instructions (Signed)
Lab work done today. We will notify you of any abnormal lab work. No news is good news!  EKG done today.  Your physician has requested that you have a carotid duplex. This test is an ultrasound of the carotid arteries in your neck. It looks at blood flow through these arteries that supply the brain with blood. Allow one hour for this exam. There are no restrictions or special instructions.  Please follow up with the [facility] in 3 months.  At the [facility], you and your health needs are our priority. As part of our continuing mission to provide you with exceptional heart care, we have created designated Provider Care Teams. These Care Teams include your primary Cardiologist (physician) and Advanced Practice Providers (APPs- Physician Assistants and Nurse Practitioners) who all work together to provide you with the care you need, when you need it.   You may see any of the following providers on your designated Care Team at your next follow up: Marland Kitchen Dr [name] . Dr [name] . Darrick Grinder, NP . Lyda Jester, PA . Audry Riles, PharmD   Please be sure to bring in all your medications bottles to every appointment.

## 2020-08-07 NOTE — Progress Notes (Signed)
Date:  08/07/2020   ID:  [name], DOB 1946/03/17, MRN [id number]    Provider location: [place] [facility] Type of Visit: Established patient  PCP:  Tyrone Lima, MD  Cardiologist:  Dr. Aundra Small   History of Present Illness: [name] is a 74 y.o. male who has a past medical history significant for morbid obesity, systolic heart failure with an EF of 30-35% (January 2014) -> 20-25% (March 2015 - RV normal), VT on amio, CAD s/p CABG 1996, LBBB, COPD, obstructive sleep apnea on CPAP, AAA repair (January 2015).   Admitted to [facility] July 24 through July 06 2014 with chest pain.  CEs negative. Had a LHC with no change from previous LHC with recommendations to continue medical management. Discharge weight was 255 pounds.   LHC 07/06/14 --No significant change from previous studies.  Left anterior descending (LAD): The LAD is a large vessel. There is a 40% stenosis immediately after the takeoff of a large septal perforator. There are 2 large diagonal branches without significant disease. Left circumflex (LCx): The LCx is occluded proximally. There are left to left collaterals.  Right coronary artery (RCA): The RCA is occluded proximally immediately following the conus branch. There are right to right and left to right collaterals. SVGs from CABG known to be totally occluded.   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   CTA chest/abd/pelvis (3/16) with moderate emphysema, 1.3 cm nodule RUL, left SFA totally occluded, right SFA with significant stenosis, s/p endovascular AAA repair (stable).   Echo (5/17) with EF 20-25%, severe LV dilation, mild MR, normal RV size with mildly decreased systolic function.   He has a RUL nodule that is most likely lung cancer, he is being treated with radiation.   Echo (8/18) with EF 30%, basal-mid inferior and inferolateral AK, severe LV dilation, normal RV, mild MR.   RHC/LHC was done in 1/19 due to worsening exertional  dyspnea.  This showed normal filling pressures and preserved cardiac output.  There was 30-40% mLAD stenosis.  The LAD provided collaterals to the LCx and RCA territories.  The LCx, RCA, SVG-OM, and SVG-PDA were all chronically occluded (known from the past).  No new disease.   [facility] with 0.78 on right, 0.59 on left => left iliac > 50% stenosis.  11/19 ABIs 0.78 right, 0.59 left.  3/20 ABIs 0.74 right, 0.66 left.   He saw neurology about his tremor.  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  However, he is on amiodarone which may be contributing.  He has been seeing Dr. Vertell Small with plan for deep brain stimulator, on hold currently due to cancer treatment.   He had chest radiation for RLL lung cancer.  However, CT showed new RUL lesion in 11/19 and he restart XRT in 12/19, repeat XRT is now completed.   Followup CT chest showed a lesion in the left upper lobe.  He had XRT again, finishing in 9/20.   Echo in 7/20 showed EF 35-40%, basal to mid inferolateral and inferior akinesis, mildly decreased RV systolic function, mild AS, PASP 22 mmHg.    He returns for followup of CHF and CAD.  Weight is down 4 lbs.  Creatinine was elevated to 2.37 in 5/21, Entresto was decreased to 24/26 bid.  He is using CPAP at night and oxygen prn. About 2 wks ago, he had an episode of likely amaurosis fugax with right visual loss for about 15-20 minutes.  No chest pain.  Chronic exertional dyspnea when walking more than 25-30 feet.  No orthopnea/PND.    [employer] device interrogation: 91% BiV pacing, no AF/AFL.  ECG (personally reviewed): A-BiV sequential pacing  Labs 3/15 Cr 1.5 K 5.6 Labs 07/06/14 K 3.9 Creatinine  0.98 Labs 9/15 K 5.1, creatinine 0.96 Labs 11/15 K 3.8, creatinine 0.83, LDL 61, LFTs normal, TSH normal Labs 11/26/14 K 5.0 Creatinine 0.73 , LFTs normal, TSH normal,  Labs 12/08/14 K 3.4 Creatinine 0.83  Labs 3/16 K 4.4, creatinine 1.03, LFTs normal, TSH normal, HCT 37.8,  BNP 65 Labs 6/16 K 4.1, creatinine 0.93, TSH normal, LFTs normal Labs 11/16 K 5.1, creatinine 1.13, LFTs normal, TSH normal, LDL 83, HDL 33, TGs 162 Labs 1/17 K 4.4, creatinine 0.86, pro-BNP 154 Labs 2/17 BNP 113, K 3.8, creatinine 0.79, TSH normal, LFTs normal Labs 4/17 K 3.8, creatinine 0.91, HCT 48.1 Labs 6/17 K 3.5, creatinine 0.98 Labs 9/17 K 3.9, creatinine 0.79, LDL 69, HDL 29, TSH normal, LFTs normal Labs 3/18 K 3.6, creatinine 0.87 Labs 8/18 K 3.3 => 4.2, creatinine 0.78 => 1.04, LDL 66, HDL 38, BNP 166 Labs 9/18: K 4.7, creatinine 0.9, BNP 144 Labs 11/18: K 4.8, creatinine 1.05, TSH normal, LFTs normal.  Labs 1/19: K 4.4, creatinine 1.08, LFTs normal Labs 5/19: LDL 60, HDL 28, TSH normal, LFTs normal, K 4.6, creatinine 1.43 Labs 11/19: K 4.6, creatinine 1.42, LFTs normal, TSH normal, Mg 2.3 Labs 1/20: TSH normal, K 4.2, creatinine 1.12, LFTs normal Labs 4/20: LDL 42, HDL 28, TGs 234 Labs 5/20: K 4.8 => 4.4, creatinine 1.95 => 1.38, TSH normal, LFTs normal Labs 7/20: K 4.1, creatinine 1.22 Labs 9/20: LDL 36, HDL 29 Labs 3/21: K 4.5, creatinine 1.76, hgb 11.8 Labs 4/21: K 4.4, creatinine 1.63, hgb 12.8, TSH 4.67 (elevated) Labs 8/21: K 4.5, creatinine 2.37 => 1.66, hgb 12.9, LDL 42  Past Medical History:  Diagnosis Date  . AAA (abdominal aortic aneurysm) ([place])    followed by Dr. Bridgett Larsson  . Adenomatous colon polyp 01/2004  . Anemia    hx  . Automatic implantable cardioverter-defibrillator in situ   . AVM (arteriovenous malformation)   . BPH (benign prostatic hypertrophy)   . CAD (coronary artery disease)    s/p CABGx2 in 1996  . Carotid artery stenosis    LCEA - Dr. Bridgett Larsson in 2013  . CHF (congestive heart failure) ([place])   . Complication of anesthesia    claustrophobic, unabe to lie on back more than 4 hours at time due to back  . COPD (chronic obstructive pulmonary disease) ([place])   . Diabetes mellitus    DIET CONTROLLED- pt states that this was a misdiagnosis, he was  treated while in the hospital  but returned home, loss a massive amount of weight and he has not had a problem with his blood sugar since. States everything has been normal for about 3 years.  . Diverticulosis   . Dyspnea   . Dysrhythmia    ICD-defibrillator  . Fatigue   . GERD (gastroesophageal reflux disease)   . H/O hiatal hernia   . History of radiation therapy 04/09/17-04/17/17   SBRT right lung 54 Gy in 3 fractions  . HLD (hyperlipidemia)   . Hypertension   . Hypothyroidism   . Ischemic cardiomyopathy   . Myocardial infarction ([place])   . 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 OSA on CPAP    AHI durign total sleep 14.69/hr, during REM  50.91/hr  . Peripheral vascular disease (HCC)    LCEA, L renal artery stent, bilat iliac stents, R SFA stenosis  . Tremor   . Ventricular tachycardia ([place]) 09/09/2014   Amiodarone was started after appropriate defibrillator shocks for ventricular tachycardia in October 2008   Past Surgical History:  Procedure Laterality Date  . ABDOMINAL AORTIC ENDOVASCULAR STENT GRAFT [name] 01/06/2014   Procedure: ABDOMINAL AORTIC ENDOVASCULAR STENT GRAFT- GORE; ULTRASOUND GUIDED;  Surgeon: Conrad Hilltop, MD;  Location: [place];  Service: Vascular;  Laterality: [name];  . ANGIOPLASTY     BILATERAL  LE  W/STENTS  . BI-VENTRICULAR IMPLANTABLE CARDIOVERTER DEFIBRILLATOR UPGRADE [name] 10/09/2014   Procedure: BI-VENTRICULAR IMPLANTABLE CARDIOVERTER DEFIBRILLATOR UPGRADE;  Surgeon: Evans Lance, MD;  Location[facility] C[facility] LAB;  Service: Cardiovascular;  Laterality: [name];  . BIV ICD GENERTAOR CHANGE OUT  10/09/2014   UPGRADE TO BIV        BY DR Lovena Le  . 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 Adora Fridge)  . CARDIAC CATHETERIZATION  10/03/2002   SVG sequentially to OM1 & OM2 totally occluded at ostium,  SVG to PDA totally occluded within previously placed prox vein graft stent, smal distal AAA, bialt iliac stents with 30% left end-stent restenosis and 50% right end-stent restnosis(Dr. Gerrie Nordmann)  . CARDIAC CATHETERIZATION  06/11/1998   L main with 20% narrowing in distal 1/3; LAD with 1st diagonla having 70% ostial narrowing, 2nd diagonal with 40% narrowing in prox third, Small & RIMA widely patent; in-stent restenosis of RCIA with successful PTA and new prox SVTRCA stent for residual disease (Dr. Marella Chimes)  . CARDIAC DEFIBRILLATOR PLACEMENT  06/2005   Guidant Vitality HE - ischemic cardiomyopathy - Dr. Marella Chimes  . CAROTID ENDARTERECTOMY Left 01/04/12  . CORONARY ANGIOPLASTY  01/08/2004   cutting balloon atherectomy & percutaneous intervention of RCIA in-stent restenosis (Dr. Marella Chimes)  . CORONARY ARTERY BYPASS GRAFT  11/04/1985   x2 - PDA and sequential DX-OM (Dr. Redmond Pulling)  . ENDARTERECTOMY  01/04/2012   Procedure: ENDARTERECTOMY CAROTID;left  Surgeon: Hinda Lenis, MD;  Location: [place];  Service: Vascular;  Laterality: Left;  with patch angioplasty  . FUDUCIAL PLACEMENT [name] 02/23/2017   Procedure: PLACEMENT OF FUDUCIAL;  Surgeon: [name], MD;  Location: [place];  Service: Thoracic;  Laterality: [name];  . ICD GENERATOR CHANGE  05/02/2010   [employer], retail  . ILIAC ARTERY STENT Bilateral 03/1997   and L SFA PTA  . Iron infusion  June 16, 2012  . LEFT HEART CATHETERIZATION WITH CORONARY ANGIOGRAM [name] 07/06/2014   Procedure: LEFT HEART CATHETERIZATION WITH CORONARY ANGIOGRAM;  Surgeon: Peter M Martinique, MD;  Location[facility] C[facility] LAB;  Service: Cardiovascular;  Laterality: [name];  . NM MYOCAR PERF WALL MOTION  09/2012   lexiscan myoview - mod-severe perfusion defect r/t infarct or scar w/mild periinfarct ishcemia in basal inferior, mid inferior, apical inferior, basal inferolateral & mid inferoalteral regions - EF 21% low risk scan  . POLYPECTOMY    . RENAL ARTERY STENT Left  03/24/2004   6x70mm Genesis stent (Dr. Marella Chimes)  . RIGHT/LEFT HEART CATH AND CORONARY ANGIOGRAPHY [name] 12/28/2017   Procedure: RIGHT/LEFT HEART CATH AND CORONARY ANGIOGRAPHY;  Surgeon: Larey Dresser, MD;  Location: [place] CV LAB;  Service: Cardiovascular;  Laterality: [name];  .  TRANSTHORACIC ECHOCARDIOGRAM  12/2012   EF 30-35; LV mod to severely dilated, mod concentric hypertrophy, severe hypokinesis of inferolateral myocardium, moderate hypokineis of anteroseptal region, grade 1 diastolic dysfunction; mod MR; LA mod-severely dialted; RV mod dialted; RA mildly dilated; PA peak pressure 43mmHg  . VIDEO BRONCHOSCOPY WITH ENDOBRONCHIAL NAVIGATION [name] 02/23/2017   Procedure: VIDEO BRONCHOSCOPY WITH ENDOBRONCHIAL NAVIGATION;  Surgeon: [name], MD;  Location: [place];  Service: Thoracic;  Laterality: [name];     Current Outpatient Medications  Medication Sig Dispense Refill  . albuterol (PROVENTIL HFA;VENTOLIN HFA) 108 (90 BASE) MCG/ACT inhaler Inhale 2 puffs into the lungs every 6 (six) hours as needed for wheezing or shortness of breath. 3 Inhaler 4  . albuterol (PROVENTIL) (2.5 MG/3ML) 0.083% nebulizer solution USE 1 VIAL IN NEBULIZER 4 TIMES DAILY 75 mL 11  . amiodarone (PACERONE) 100 MG tablet Take 1 tablet (100 mg total) by mouth daily. 30 tablet 11  . atorvastatin (LIPITOR) 80 MG tablet TAKE 1 TABLET BY MOUTH EVERY DAY 90 tablet 2  . ATROVENT HFA 17 MCG/ACT inhaler TAKE 2 PUFFS BY MOUTH EVERY 4 HOURS AS NEEDED FOR WHEEZE  12  . carvedilol (COREG) 25 MG tablet Take 1 tablet (25 mg total) by mouth 2 (two) times daily with a meal. 180 tablet 1  . dapagliflozin propanediol (FARXIGA) 10 MG TABS tablet Take 1 tablet (10 mg total) by mouth daily before breakfast. 30 tablet 11  . diazepam (VALIUM) 5 MG tablet Take 1 tablet (5 mg total) by mouth every 8 (eight) hours as needed for anxiety. 1-2 tabs every 8 hours if needed for tremor 30 tablet 5  . ezetimibe (ZETIA) 10 MG tablet Take 1  tablet (10 mg total) by mouth daily. 90 tablet 3  . finasteride (PROSCAR) 5 MG tablet Take 5 mg by mouth daily.    . fluticasone (FLONASE) 50 MCG/ACT nasal spray Instill 1 spray in each  nostril daily 32 g 3  . Fluticasone-Umeclidin-Vilant (TRELEGY ELLIPTA) 100-62.5-25 MCG/INH AEPB Inhale 1 puff into the lungs daily. Rinse mouth 60 each 12  . Iron-FA-B Cmp-C-Biot-Probiotic (FUSION PLUS) CAPS Take 1 capsule by mouth daily. 90 capsule 1  . isosorbide mononitrate (IMDUR) 60 MG 24 hr tablet TAKE 1 TABLET BY MOUTH  DAILY 90 tablet 3  . KLOR-CON M20 20 MEQ tablet TAKE 1 TABLET BY MOUTH EVERY DAY 90 tablet 3  . levothyroxine (SYNTHROID) 50 MCG tablet TAKE 1 TABLET BY MOUTH  DAILY BEFORE BREAKFAST 90 tablet 1  . nitroGLYCERIN (NITROSTAT) 0.4 MG SL tablet PLACE 1 TABLET (0.4 MG TOTAL) UNDER THE TONGUE EVERY 5 (FIVE) MINUTES AS NEEDED FOR CHEST PAIN. 75 tablet 1  . NON FORMULARY at bedtime. CPAP And during the day as needed    . sacubitril-valsartan (ENTRESTO) 24-26 MG Take 1 tablet by mouth 2 (two) times daily. 60 tablet 11  . spironolactone (ALDACTONE) 25 MG tablet TAKE 1 TABLET BY MOUTH  DAILY 90 tablet 3  . torsemide (DEMADEX) 20 MG tablet Take 2 tablets (40 mg total) by mouth every morning AND 1 tablet (20 mg total) every evening. 150 tablet 5  . traMADol (ULTRAM) 50 MG tablet Take 1 tablet (50 mg total) by mouth every 6 (six) hours as needed. 75 tablet 3  . VASCEPA 1 g capsule TAKE 2 CAPSULES (2 G TOTAL) BY MOUTH 2 (TWO) TIMES A DAY. 120 capsule 11  . XARELTO 20 MG TABS tablet TAKE 1 TABLET (20 MG TOTAL) BY MOUTH DAILY WITH SUPPER. STOP  ASPIRIN AND CLOPIDIGREL 90 tablet 3   No current facility-administered medications for this encounter.    Allergies:   Tussionex pennkinetic er [hydrocod polst-cpm polst er] and Ace inhibitors   Social History:  The patient  reports that he quit smoking about 17 years ago. His smoking use included cigarettes and pipe. He has a 60.00 pack-year smoking history. He  has never used smokeless tobacco. He reports that he does not drink alcohol and does not use drugs.   Family History:  The patient's family history includes Cancer in his mother and sister; Colon cancer in his sister; Diabetes in his daughter, mother, and sister; Heart attack in his father and maternal grandmother; Heart disease in his father, mother, and sister; Hyperlipidemia in his mother and sister; Hypertension in his father, mother, and sister; Lung cancer in his mother; Parkinson's disease in his sister.   ROS:  Please see the history of present illness.   All other systems are personally reviewed and negative.   Exam:   BP (!) 104/52   Pulse 70   Wt 121.5 kg (267 lb 12.8 oz)   SpO2 96%   BMI 40.72 kg/m  General: NAD Neck: No JVD, no thyromegaly or thyroid nodule.  Lungs: Distant BS CV: Nondisplaced PMI.  Heart regular S1/S2, no S3/S4, no murmur.  No peripheral edema.  No carotid bruit.  Normal pedal pulses.  Abdomen: Soft, nontender, no hepatosplenomegaly, no distention.  Skin: Intact without lesions or rashes.  Neurologic: Alert and oriented x 3.  Psych: Normal affect. Extremities: No clubbing or cyanosis.  HEENT: Normal.   Recent Labs: 02/27/2020: Magnesium 2.3 07/14/2020: Hemoglobin 12.9; Platelets 156 08/06/2020: ALT 13; BUN 34; Creatinine, Ser 1.45; Potassium 4.2; Sodium 139; TSH 3.623  Personally reviewed   Wt Readings from Last 3 Encounters:  08/06/20 121.5 kg (267 lb 12.8 oz)  07/14/20 120.3 kg (265 lb 4 oz)  05/31/20 122.3 kg (269 lb 11.2 oz)      ASSESSMENT AND PLAN:  1. Chronic systolic CHF: Ischemic cardiomyopathy. Echo in 8/18 showed EF 30% with severe LV dilation and normal RV.  [employer] CRT-D.  RHC in 1/19 showed normal filling pressures and preserved cardiac output.  Last echo in 7/20 showed EF 35-40% with mildly decreased RV systolic function.  NYHA class IIIb symptoms chronically.  He does not appear significantly volume overloaded on exam today;  I suspect that his dyspnea is more due to his COPD.  Weight is down 4 lbs.   - Continue torsemide 40 qam/20 qpm.  BMET today.  - Continue Coreg 25 mg bid.   - Continue Entresto 24/26 bid, dose lowered due to renal dysfunction.     - Continue spironolactone 25 mg daily.  - Continue dapagliflozin 10 mg daily.  - Arrange for repeat echo at next appt.  2. CAD s/p CABG: LHC 1/19 with stable anatomy (LAD is patent, other vessels occluded with collaterals from the LAD).  He has had angina with atrial fibrillation/RVR runs, however this seems to be better on Imdur.  No recent chest pain.  - Continue atorvastatin, good lipids in 8/21.   - He is not on ASA due to Xarelto use.   - Continue Imdur 60 mg daily.  3. VT s/p ICD: On amiodarone 100 mg daily for history of ICD discharges due to VT.   - CMET/TSH today.  He will need a regular eye exam.  4. Morbid obesity: Weight is lower.  5. COPD: Moderate to severe.  Historically,  a lot of his dyspnea has seemed to be due to COPD.   - Followup with pulmonary. - Now has home oxygen which he uses prn.  6. Lung cancer: He completed radiation.  7. PAD: Occluded left SFA and significant right SFA stenosis on last CTA.  ABIs in 3/20 with significant disease bilaterally but stable.  Does not get leg pain with exertion but has nocturnal cramps.  [facility], medical management for now.  He is on statin, good lipids in 8/21.  8. AAA: s/p repair.  Followed at [facility].  No endoleak on last evaluation.  9. Tremor: Most likely essential tremor given family history.  However, cannot rule out role for amiodarone. As above, I think that he needs to continue at least a low dose of amiodarone.  Dr. Carles Collet with neurology discussed deep brain stimulation => this is on hold since he had a diagnosis of lung cancer.   10. Hyperlipidemia: He is on Vascepa and atorvastatin.  - Good lipids in 8/21.  11. PVCs: Frequent PVCs noted in the past, suspect that this decreases the percentage of  BiV pacing and also may contribute to low EF.  - Continue amiodarone to suppress PVCs.  - Continue Coreg 25 mg bid. 12. Atrial fibrillation: Paroxysmal.  None noted recently on device interrogation.   - Continue Coreg and amiodarone.  - Continue Xarelto but will decrease dose to 15 mg daily based on GFR from today, check CBC.  13. OSA: Using CPAP.  14. Amaurosis fugax: 8/21, loss of vision transiently in right eye. Possibly related to atrial fibrillation though he has been on Xarelto without missing doses.  - Check carotid dopplers (known occluded RICA).   Followup in 3 months.   Signed, [name], MD  08/07/2020  [place] [address] [facility] [place] [address] [phone] (office) [phone] (fax)

## 2020-08-10 ENCOUNTER — Telehealth (HOSPITAL_COMMUNITY): Payer: Self-pay | Admitting: *Deleted

## 2020-08-10 MED ORDER — RIVAROXABAN 15 MG PO TABS: 15.0000 mg | ORAL_TABLET | Freq: Every day | ORAL | 11 refills | Status: AC

## 2020-08-10 NOTE — Telephone Encounter (Signed)
-----   Message from Larey Dresser, MD sent at 08/07/2020  9:48 PM EDT ----- Based on current GFR, Mr Labelle needs to decrease Xarelto to 15 mg daily. Please arrange.

## 2020-08-10 NOTE — Telephone Encounter (Signed)
Pt's wife is aware, agreeable, and verbalized understanding, new rx sent in

## 2020-08-10 NOTE — Telephone Encounter (Signed)
Attempted to call pt w/recommendation and Left message to call back

## 2020-08-20 ENCOUNTER — Ambulatory Visit (INDEPENDENT_AMBULATORY_CARE_PROVIDER_SITE_OTHER): Payer: Medicare Other | Admitting: *Deleted

## 2020-08-20 DIAGNOSIS — I255 Ischemic cardiomyopathy: Secondary | ICD-10-CM

## 2020-08-20 LAB — CUP PACEART REMOTE DEVICE CHECK
Battery Remaining Longevity: 42 mo
Battery Remaining Percentage: 61 %
Brady Statistic RA Percent Paced: 85 %
Brady Statistic RV Percent Paced: 92 %
Date Time Interrogation Session: 20210910033300
HighPow Impedance: 45 Ohm
Implantable Lead Implant Date: 20060411
Implantable Lead Implant Date: 20151030
Implantable Lead Implant Date: 20151030
Implantable Lead Location: 753858
Implantable Lead Location: 753859
Implantable Lead Location: 753860
Implantable Lead Model: 148
Implantable Lead Model: 4136
Implantable Lead Serial Number: 145070
Implantable Lead Serial Number: 29713415
Implantable Pulse Generator Implant Date: 20151030
Lead Channel Impedance Value: 569 Ohm
Lead Channel Impedance Value: 595 Ohm
Lead Channel Impedance Value: 703 Ohm
Lead Channel Pacing Threshold Amplitude: 0.5 V
Lead Channel Pacing Threshold Amplitude: 0.7 V
Lead Channel Pacing Threshold Amplitude: 1.4 V
Lead Channel Pacing Threshold Pulse Width: 0.4 ms
Lead Channel Pacing Threshold Pulse Width: 0.4 ms
Lead Channel Pacing Threshold Pulse Width: 0.9 ms
Lead Channel Setting Pacing Amplitude: 2 V
Lead Channel Setting Pacing Amplitude: 2.2 V
Lead Channel Setting Pacing Amplitude: 3.5 V
Lead Channel Setting Pacing Pulse Width: 0.4 ms
Lead Channel Setting Pacing Pulse Width: 0.9 ms
Lead Channel Setting Sensing Sensitivity: 0.6 mV
Lead Channel Setting Sensing Sensitivity: 1 mV
Pulse Gen Serial Number: 370874

## 2020-08-23 ENCOUNTER — Other Ambulatory Visit (HOSPITAL_COMMUNITY): Payer: Self-pay

## 2020-08-23 MED ORDER — POTASSIUM CHLORIDE CRYS ER 20 MEQ PO TBCR: 20.0000 meq | EXTENDED_RELEASE_TABLET | Freq: Every day | ORAL | 1 refills | Status: DC

## 2020-08-23 MED ORDER — EZETIMIBE 10 MG PO TABS: 10.0000 mg | ORAL_TABLET | Freq: Every day | ORAL | 1 refills | Status: DC

## 2020-08-23 NOTE — Progress Notes (Signed)
Remote ICD transmission.   

## 2020-08-24 ENCOUNTER — Encounter (HOSPITAL_COMMUNITY): Payer: Medicare Other

## 2020-08-25 ENCOUNTER — Ambulatory Visit (HOSPITAL_COMMUNITY)
Admission: RE | Admit: 2020-08-25 | Discharge: 2020-08-25 | Disposition: A | Discharge status: Home / Self Care | Payer: Medicare Other | Source: Ambulatory Visit | Attending: Cardiology | Admitting: Cardiology

## 2020-08-25 ENCOUNTER — Telehealth: Payer: Self-pay | Admitting: Internal Medicine

## 2020-08-25 ENCOUNTER — Other Ambulatory Visit (HOSPITAL_COMMUNITY): Payer: Self-pay | Admitting: Cardiology

## 2020-08-25 ENCOUNTER — Other Ambulatory Visit: Payer: Self-pay

## 2020-08-25 DIAGNOSIS — I779 Disorder of arteries and arterioles, unspecified: Secondary | ICD-10-CM

## 2020-08-25 DIAGNOSIS — I6521 Occlusion and stenosis of right carotid artery: Secondary | ICD-10-CM

## 2020-08-25 NOTE — Telephone Encounter (Signed)
ATC [facility] closed attempt 08/26/20

## 2020-08-26 NOTE — Assessment & Plan Note (Signed)
He benefits and depends on O2 Plan- Continue O2 3:, including through CPAP during sleep

## 2020-08-26 NOTE — Telephone Encounter (Signed)
Horris Latino, has this fax come through?

## 2020-08-26 NOTE — Telephone Encounter (Signed)
Yes I have the Fax.Spoke with wendell at [place] regarding patient .Windell stated that in patient's last office note from 05/31/20 missing use and benerfits from  CPAP. Is patient still using CPAP? Gave Dr.Young paper's   Dr.Young can you please advise.  Thank you

## 2020-08-26 NOTE — Assessment & Plan Note (Signed)
He benefits from CPAP with good compliance reported Plan- continue CPAP, replacing old machine with auto 10-20 and continuing to bleed in 3L O2.

## 2020-08-26 NOTE — Assessment & Plan Note (Signed)
Bronchitis exacerbation responded ot antibiotics. Now at baseline.

## 2020-08-27 NOTE — Telephone Encounter (Signed)
I have completed the note and included the information [facility] needs

## 2020-08-31 NOTE — Telephone Encounter (Signed)
Has this been faxed back to [place]? Thanks!

## 2020-08-31 NOTE — Telephone Encounter (Signed)
Forms have been faxed back to [facility] on 08/30/20. Nothing else further needed.

## 2020-09-10 ENCOUNTER — Telehealth: Payer: Self-pay | Admitting: Internal Medicine

## 2020-09-10 NOTE — Telephone Encounter (Signed)
Will have to call 10/4 since they are now closed

## 2020-09-13 NOTE — Telephone Encounter (Signed)
Called and spoke to [place] with [facility]. She states she received the office note but did not get the prescription form. Per telephone note from 9.15.21 this was already faxed back on 9/20. [name] is asking we re-fax this to her at fax: [phone].   Will forward this to triage to see if it can be located.

## 2020-09-14 NOTE — Telephone Encounter (Signed)
Checked CY's scan folder, this document can't be located. Called [facility] to have this resent to [place] but was placed on a hold with no one coming to the line. Will try back.

## 2020-09-15 NOTE — Telephone Encounter (Signed)
Faxed original referral for CPAP with [name]' s NPI and our address included to [place] at [facility]. Nothing further needed at this time.

## 2020-09-15 NOTE — Telephone Encounter (Signed)
[place] states fax was cut off at the bottom.  Missing NPI, and address.  Please refax.  Fax:  [phone].  Fax was prescription for CPAP.  Please advise.  [phone]

## 2020-09-17 ENCOUNTER — Other Ambulatory Visit (HOSPITAL_COMMUNITY): Payer: Self-pay | Admitting: Cardiology

## 2020-10-05 ENCOUNTER — Ambulatory Visit (INDEPENDENT_AMBULATORY_CARE_PROVIDER_SITE_OTHER): Payer: Medicare Other | Admitting: Internal Medicine

## 2020-10-05 ENCOUNTER — Other Ambulatory Visit: Payer: Self-pay

## 2020-10-05 ENCOUNTER — Encounter: Payer: Self-pay | Admitting: Internal Medicine

## 2020-10-05 VITALS — BP 106/62 | HR 73 | Ht 68.0 in | Wt 261.0 lb

## 2020-10-05 DIAGNOSIS — Z95810 Presence of automatic (implantable) cardiac defibrillator: Secondary | ICD-10-CM | POA: Diagnosis not present

## 2020-10-05 DIAGNOSIS — Z951 Presence of aortocoronary bypass graft: Secondary | ICD-10-CM

## 2020-10-05 DIAGNOSIS — I255 Ischemic cardiomyopathy: Secondary | ICD-10-CM

## 2020-10-05 DIAGNOSIS — I5022 Chronic systolic (congestive) heart failure: Secondary | ICD-10-CM | POA: Insufficient documentation

## 2020-10-05 DIAGNOSIS — I480 Paroxysmal atrial fibrillation: Secondary | ICD-10-CM | POA: Diagnosis not present

## 2020-10-05 NOTE — Progress Notes (Signed)
HPI [name] returns today for followup. He is a pleasant morbidly obese 74 yo man with chronic systolic heart failure, HTN, and PVC's. He was noted on his device interrogation to have 12% PVC's and a reduced effective biv pacing. We increased his pacing rate from 60 to 70/min and his PVC burden markedly improved. He denies chest pain. He is fairly sedentary. He has lost 30 lbs since his last visit. Allergies  Allergen Reactions   Tussionex Pennkinetic Er [Hydrocod Polst-Cpm Polst Er] Other (See Comments)    UNSPECIFIED REACTION  > "caused prostate problems"   Ace Inhibitors Cough     Current Outpatient Medications  Medication Sig Dispense Refill   albuterol (PROVENTIL HFA;VENTOLIN HFA) 108 (90 BASE) MCG/ACT inhaler Inhale 2 puffs into the lungs every 6 (six) hours as needed for wheezing or shortness of breath. 3 Inhaler 4   albuterol (PROVENTIL) (2.5 MG/3ML) 0.083% nebulizer solution USE 1 VIAL IN NEBULIZER 4 TIMES DAILY 75 mL 11   amiodarone (PACERONE) 100 MG tablet Take 1 tablet (100 mg total) by mouth daily. 30 tablet 11   atorvastatin (LIPITOR) 80 MG tablet TAKE 1 TABLET BY MOUTH EVERY DAY 90 tablet 2   ATROVENT HFA 17 MCG/ACT inhaler TAKE 2 PUFFS BY MOUTH EVERY 4 HOURS AS NEEDED FOR WHEEZE  12   carvedilol (COREG) 25 MG tablet TAKE 1 TABLET BY MOUTH  TWICE DAILY WITH A MEAL 180 tablet 3   dapagliflozin propanediol (FARXIGA) 10 MG TABS tablet Take 1 tablet (10 mg total) by mouth daily before breakfast. 30 tablet 11   diazepam (VALIUM) 5 MG tablet Take 1 tablet (5 mg total) by mouth every 8 (eight) hours as needed for anxiety. 1-2 tabs every 8 hours if needed for tremor 30 tablet 5   ezetimibe (ZETIA) 10 MG tablet Take 1 tablet (10 mg total) by mouth daily. 90 tablet 1   finasteride (PROSCAR) 5 MG tablet Take 5 mg by mouth daily.     fluticasone (FLONASE) 50 MCG/ACT nasal spray Instill 1 spray in each  nostril daily 32 g 3   Fluticasone-Umeclidin-Vilant (TRELEGY  ELLIPTA) 100-62.5-25 MCG/INH AEPB Inhale 1 puff into the lungs daily. Rinse mouth 60 each 12   Iron-FA-B Cmp-C-Biot-Probiotic (FUSION PLUS) CAPS Take 1 capsule by mouth daily. 90 capsule 1   isosorbide mononitrate (IMDUR) 60 MG 24 hr tablet TAKE 1 TABLET BY MOUTH  DAILY 90 tablet 3   levothyroxine (SYNTHROID) 50 MCG tablet TAKE 1 TABLET BY MOUTH  DAILY BEFORE BREAKFAST 90 tablet 1   nitroGLYCERIN (NITROSTAT) 0.4 MG SL tablet PLACE 1 TABLET (0.4 MG TOTAL) UNDER THE TONGUE EVERY 5 (FIVE) MINUTES AS NEEDED FOR CHEST PAIN. 75 tablet 1   NON FORMULARY at bedtime. CPAP And during the day as needed     potassium chloride SA (KLOR-CON M20) 20 MEQ tablet Take 1 tablet (20 mEq total) by mouth daily. 90 tablet 1   Rivaroxaban (XARELTO) 15 MG TABS tablet Take 1 tablet (15 mg total) by mouth daily with supper. 30 tablet 11   sacubitril-valsartan (ENTRESTO) 24-26 MG Take 1 tablet by mouth 2 (two) times daily. 60 tablet 11   spironolactone (ALDACTONE) 25 MG tablet TAKE 1 TABLET BY MOUTH  DAILY 90 tablet 3   torsemide (DEMADEX) 20 MG tablet Take 2 tablets (40 mg total) by mouth every morning AND 1 tablet (20 mg total) every evening. 150 tablet 5   traMADol (ULTRAM) 50 MG tablet Take 1 tablet (50 mg  total) by mouth every 6 (six) hours as needed. 75 tablet 3   VASCEPA 1 g capsule TAKE 2 CAPSULES (2 G TOTAL) BY MOUTH 2 (TWO) TIMES A DAY. 120 capsule 11   No current facility-administered medications for this visit.     Past Medical History:  Diagnosis Date   AAA (abdominal aortic aneurysm) ([place])    followed by Dr. Bridgett Larsson   Adenomatous colon polyp 01/2004   Anemia    hx   Automatic implantable cardioverter-defibrillator in situ    AVM (arteriovenous malformation)    BPH (benign prostatic hypertrophy)    CAD (coronary artery disease)    s/p CABGx2 in 1996   Carotid artery stenosis    LCEA - Dr. Bridgett Larsson in 2013   CHF (congestive heart failure) (H[facility])    Complication of anesthesia     claustrophobic, unabe to lie on back more than 4 hours at time due to back   COPD (chronic obstructive pulmonary disease) ([place])    Diabetes mellitus    DIET CONTROLLED- pt states that this was a misdiagnosis, he was treated while in the hospital  but returned home, loss a massive amount of weight and he has not had a problem with his blood sugar since. States everything has been normal for about 3 years.   Diverticulosis    Dyspnea    Dysrhythmia    ICD-defibrillator   Fatigue    GERD (gastroesophageal reflux disease)    H/O hiatal hernia    History of radiation therapy 04/09/17-04/17/17   SBRT right lung 54 Gy in 3 fractions   HLD (hyperlipidemia)    Hypertension    Hypothyroidism    Ischemic cardiomyopathy    Myocardial infarction ([place])    NSCL ca 2018   recurrence x 3   OSA on CPAP    AHI durign total sleep 14.69/hr, during REM 50.91/hr   Peripheral vascular disease (HCC)    LCEA, L renal artery stent, bilat iliac stents, R SFA stenosis   Tremor    Ventricular tachycardia ([place]) 09/09/2014   Amiodarone was started after appropriate defibrillator shocks for ventricular tachycardia in October 2008    ROS:   All systems reviewed and negative except as noted in the HPI.   Past Surgical History:  Procedure Laterality Date   ABDOMINAL AORTIC ENDOVASCULAR STENT GRAFT [name] 01/06/2014   Procedure: ABDOMINAL AORTIC ENDOVASCULAR STENT GRAFT- GORE; ULTRASOUND GUIDED;  Surgeon: Conrad McFarland, MD;  Location: [place];  Service: Vascular;  Laterality: [name];   ANGIOPLASTY     BILATERAL  LE  W/STENTS   BI-VENTRICULAR IMPLANTABLE CARDIOVERTER DEFIBRILLATOR UPGRADE [name] 10/09/2014   Procedure: BI-VENTRICULAR IMPLANTABLE CARDIOVERTER DEFIBRILLATOR UPGRADE;  Surgeon: Evans Lance, MD;  Location: [facility] CATH LAB;  Service: Cardiovascular;  Laterality: [name];   BIV ICD GENERTAOR CHANGE OUT  10/09/2014   UPGRADE TO BIV        BY DR Lovena Le   CARDIAC CATHETERIZATION  09/16/2007    occlusion of both vein grafts, no significant LAD disease or diagonal disease, Cfx collaterals from the left, 70% in-stent restenosis of L renal artery, normal L main, RCA occluded ostially (Dr. Adora Fridge)   CARDIAC CATHETERIZATION  10/03/2002   SVG sequentially to OM1 & OM2 totally occluded at ostium, SVG to PDA totally occluded within previously placed prox vein graft stent, smal distal AAA, bialt iliac stents with 30% left end-stent restenosis and 50% right end-stent restnosis(Dr. Gerrie Nordmann)   CARDIAC CATHETERIZATION  06/11/1998   L main with  20% narrowing in distal 1/3; LAD with 1st diagonla having 70% ostial narrowing, 2nd diagonal with 40% narrowing in prox third, LIMA & RIMA widely patent; in-stent restenosis of RCIA with successful PTA and new prox SVTRCA stent for residual disease (Dr. Marella Chimes)   [place]  06/2005   [employer]ant Vitality HE - ischemic cardiomyopathy - Dr. Marella Chimes   CAROTID ENDARTERECTOMY Left 01/04/12   CORONARY ANGIOPLASTY  01/08/2004   cutting balloon atherectomy & percutaneous intervention of RCIA in-stent restenosis (Dr. Marella Chimes)   CORONARY ARTERY BYPASS GRAFT  11/04/1985   x2 - PDA and sequential DX-OM (Dr. Redmond Pulling)   ENDARTERECTOMY  01/04/2012   Procedure: ENDARTERECTOMY CAROTID;left  Surgeon: Hinda Lenis, MD;  Location: [place];  Service: Vascular;  Laterality: Left;  with patch angioplasty   FUDUCIAL PLACEMENT [name] 02/23/2017   Procedure: PLACEMENT OF FUDUCIAL;  Surgeon: [name], MD;  Location: [place];  Service: Thoracic;  Laterality: [name];   ICD GENERATOR CHANGE  05/02/2010   [employer] Teligen   ILIAC ARTERY STENT Bilateral 03/1997   and L SFA PTA   Iron infusion  June 16, 2012   LEFT HEART CATHETERIZATION WITH CORONARY ANGIOGRAM [name] 07/06/2014   Procedure: LEFT HEART CATHETERIZATION WITH CORONARY ANGIOGRAM;  Surgeon: Peter M Martinique, MD;  Location: [facility] CATH LAB;  Service: Cardiovascular;  Laterality: [name];    NM MYOCAR PERF WALL MOTION  09/2012   lexiscan myoview - mod-severe perfusion defect r/t infarct or scar w/mild periinfarct ishcemia in basal inferior, mid inferior, apical inferior, basal inferolateral & mid inferoalteral regions - EF 21% low risk scan   POLYPECTOMY     RENAL ARTERY STENT Left 03/24/2004   6x29mm Genesis stent (Dr. Marella Chimes)   RIGHT/LEFT HEART CATH AND CORONARY ANGIOGRAPHY [name] 12/28/2017   Procedure: RIGHT/LEFT HEART CATH AND CORONARY ANGIOGRAPHY;  Surgeon: Larey Dresser, MD;  Location: [place] CV LAB;  Service: Cardiovascular;  Laterality: [name];   TRANSTHORACIC ECHOCARDIOGRAM  12/2012   EF 30-35; LV mod to severely dilated, mod concentric hypertrophy, severe hypokinesis of inferolateral myocardium, moderate hypokineis of anteroseptal region, grade 1 diastolic dysfunction; mod MR; LA mod-severely dialted; RV mod dialted; RA mildly dilated; PA peak pressure 14mmHg   VIDEO BRONCHOSCOPY WITH ENDOBRONCHIAL NAVIGATION [name] 02/23/2017   Procedure: VIDEO BRONCHOSCOPY WITH ENDOBRONCHIAL NAVIGATION;  Surgeon: [name], MD;  Location: [place];  Service: Thoracic;  Laterality: [name];     Family History  Problem Relation Age of Onset   Lung cancer Mother    Cancer Mother    Diabetes Mother    Hypertension Mother    Hyperlipidemia Mother    Heart disease Mother        before age 51   Heart attack Father    Heart disease Father        before age 31   Hypertension Father    Colon cancer Sister    Cancer Sister    Hypertension Sister    Hyperlipidemia Sister    Parkinson's disease Sister    Diabetes Sister    Heart disease Sister        before age 50   Heart attack Maternal Grandmother    Diabetes Daughter      Social History   Socioeconomic History   Marital status: Married    Spouse name: Not on file   Number of children: 2   Years of education: Not on file   Highest education level: Not on file  Occupational History    Occupation: retired Solicitor   Tobacco Use   Smoking status: Former Smoker    Packs/day: 1.50    Years: 40.00    Pack years: 60.00    Types: Cigarettes, Pipe    Quit date: 12/11/2002    Years since quitting: 17.8   Smokeless tobacco: Never Used   Tobacco comment: Pt reports smoking 1 pack/day before quitting in 2004  Vaping Use   Vaping Use: Never used  Substance and Sexual Activity   Alcohol use: No    Alcohol/week: 0.0 standard drinks   Drug use: No   Sexual activity: Not Currently  Other Topics Concern   Not on file  Social History Narrative   Not on file   Social Determinants of Health   Financial Resource Strain:    Difficulty of Paying Living Expenses: Not on file  Food Insecurity:    Worried About Charity fundraiser in the Last Year: Not on file   [employer] of Food in the Last Year: Not on file  Transportation Needs:    Lack of Transportation (Medical): Not on file   Lack of Transportation (Non-Medical): Not on file  Physical Activity:    Days of Exercise per Week: Not on file   Minutes of Exercise per Session: Not on file  Stress:    Feeling of Stress : Not on file  Social Connections:    Frequency of Communication with Friends and Family: Not on file   Frequency of Social Gatherings with Friends and Family: Not on file   Attends Religious Services: Not on file   Active Member of Clubs or Organizations: Not on file   Attends Archivist Meetings: Not on file   Marital Status: Not on file  Intimate Partner Violence:    Fear of Current or Ex-Partner: Not on file   Emotionally Abused: Not on file   Physically Abused: Not on file   Sexually Abused: Not on file     BP 106/62    Pulse 73    Ht 5\' 8"  (1.727 m)    Wt 261 lb (118.4 kg)    SpO2 97%    BMI 39.68 kg/m   Physical Exam:  Morbidly obese appearing NAD HEENT: Unremarkable Neck:  No JVD, no thyromegally Lymphatics:  No adenopathy Back:  No CVA  tenderness Lungs:  Clear with no wheezes HEART:  Regular rate rhythm, no murmurs, no rubs, no clicks Abd:  soft, positive bowel sounds, no organomegally, no rebound, no guarding Ext:  2 plus pulses, no edema, no cyanosis, no clubbing Skin:  No rashes no nodules Neuro:  CN II through XII intact, motor grossly intact  EKG - nsr with biv pacing  DEVICE  Normal device function.  See PaceArt for details.   Assess/Plan: 1. Morbid obesity - I discussed intermittent fasting. He is not diabetic. I strongly encouraged him to try and lose weight 2. Chronic systolic heart failure -his symptoms have improved. 3. PVC's - he has about 2% burden since we increased his pacing rate from 60 to 70. 4. HTN - is bp is well controlled. No change in his meds.  Tyrone Overlie Tiziana Cislo,MD

## 2020-10-05 NOTE — Patient Instructions (Signed)
Medication Instructions:  Your physician recommends that you continue on your current medications as directed. Please refer to the Current Medication list given to you today.  Labwork: None ordered.  Testing/Procedures: None ordered.  Follow-Up: Your physician wants you to follow-up in: one year with Dr. Lovena Le.   You will receive a reminder letter in the mail two months in advance. If you don't receive a letter, please call our office to schedule the follow-up appointment.  Remote monitoring is used to monitor your ICD from home. This monitoring reduces the number of office visits required to check your device to one time per year. It allows [place] to keep an eye on the functioning of your device to ensure it is working properly. You are scheduled for a device check from home on 11/19/2020. You may send your transmission at any time that day. If you have a wireless device, the transmission will be sent automatically. After your physician reviews your transmission, you will receive a postcard with your next transmission date.  Any Other Special Instructions Will Be Listed Below (If Applicable).  If you need a refill on your cardiac medications before your next appointment, please call your pharmacy.

## 2020-10-07 ENCOUNTER — Other Ambulatory Visit: Payer: Self-pay | Admitting: Internal Medicine

## 2020-10-18 ENCOUNTER — Other Ambulatory Visit: Payer: Self-pay | Admitting: Internal Medicine

## 2020-10-18 ENCOUNTER — Other Ambulatory Visit: Payer: Self-pay | Admitting: Family

## 2020-10-18 DIAGNOSIS — E519 Thiamine deficiency, unspecified: Secondary | ICD-10-CM

## 2020-10-26 DIAGNOSIS — Z23 Encounter for immunization: Secondary | ICD-10-CM | POA: Diagnosis not present

## 2020-10-27 ENCOUNTER — Other Ambulatory Visit (HOSPITAL_COMMUNITY): Payer: Self-pay | Admitting: Cardiology

## 2020-11-12 ENCOUNTER — Ambulatory Visit (HOSPITAL_COMMUNITY)
Admission: RE | Admit: 2020-11-12 | Discharge: 2020-11-12 | Disposition: A | Discharge status: Home / Self Care | Payer: Medicare Other | Source: Ambulatory Visit | Attending: Cardiology | Admitting: Cardiology

## 2020-11-12 ENCOUNTER — Encounter (HOSPITAL_COMMUNITY): Payer: Self-pay | Admitting: Cardiology

## 2020-11-12 ENCOUNTER — Other Ambulatory Visit: Payer: Self-pay

## 2020-11-12 VITALS — BP 102/58 | HR 92 | Ht 68.0 in | Wt 272.4 lb

## 2020-11-12 DIAGNOSIS — I5022 Chronic systolic (congestive) heart failure: Secondary | ICD-10-CM | POA: Diagnosis not present

## 2020-11-12 DIAGNOSIS — I5042 Chronic combined systolic (congestive) and diastolic (congestive) heart failure: Secondary | ICD-10-CM

## 2020-11-12 DIAGNOSIS — I480 Paroxysmal atrial fibrillation: Secondary | ICD-10-CM | POA: Diagnosis not present

## 2020-11-12 DIAGNOSIS — I4819 Other persistent atrial fibrillation: Secondary | ICD-10-CM | POA: Diagnosis not present

## 2020-11-12 DIAGNOSIS — Z79899 Other long term (current) drug therapy: Secondary | ICD-10-CM | POA: Insufficient documentation

## 2020-11-12 DIAGNOSIS — I252 Old myocardial infarction: Secondary | ICD-10-CM | POA: Insufficient documentation

## 2020-11-12 DIAGNOSIS — Z923 Personal history of irradiation: Secondary | ICD-10-CM | POA: Insufficient documentation

## 2020-11-12 DIAGNOSIS — Z6841 Body Mass Index (BMI) 40.0 and over, adult: Secondary | ICD-10-CM | POA: Diagnosis not present

## 2020-11-12 DIAGNOSIS — Z87891 Personal history of nicotine dependence: Secondary | ICD-10-CM | POA: Diagnosis not present

## 2020-11-12 DIAGNOSIS — I25119 Atherosclerotic heart disease of native coronary artery with unspecified angina pectoris: Secondary | ICD-10-CM | POA: Diagnosis not present

## 2020-11-12 DIAGNOSIS — Z8249 Family history of ischemic heart disease and other diseases of the circulatory system: Secondary | ICD-10-CM | POA: Insufficient documentation

## 2020-11-12 DIAGNOSIS — J449 Chronic obstructive pulmonary disease, unspecified: Secondary | ICD-10-CM | POA: Insufficient documentation

## 2020-11-12 DIAGNOSIS — G4733 Obstructive sleep apnea (adult) (pediatric): Secondary | ICD-10-CM | POA: Insufficient documentation

## 2020-11-12 DIAGNOSIS — E785 Hyperlipidemia, unspecified: Secondary | ICD-10-CM | POA: Diagnosis not present

## 2020-11-12 DIAGNOSIS — Z85118 Personal history of other malignant neoplasm of bronchus and lung: Secondary | ICD-10-CM | POA: Insufficient documentation

## 2020-11-12 DIAGNOSIS — Z951 Presence of aortocoronary bypass graft: Secondary | ICD-10-CM | POA: Insufficient documentation

## 2020-11-12 DIAGNOSIS — I110 Hypertensive heart disease with heart failure: Secondary | ICD-10-CM | POA: Diagnosis not present

## 2020-11-12 DIAGNOSIS — G453 Amaurosis fugax: Secondary | ICD-10-CM | POA: Insufficient documentation

## 2020-11-12 DIAGNOSIS — Z95810 Presence of automatic (implantable) cardiac defibrillator: Secondary | ICD-10-CM | POA: Diagnosis not present

## 2020-11-12 DIAGNOSIS — E1151 Type 2 diabetes mellitus with diabetic peripheral angiopathy without gangrene: Secondary | ICD-10-CM | POA: Diagnosis not present

## 2020-11-12 DIAGNOSIS — I493 Ventricular premature depolarization: Secondary | ICD-10-CM | POA: Insufficient documentation

## 2020-11-12 DIAGNOSIS — Z7901 Long term (current) use of anticoagulants: Secondary | ICD-10-CM | POA: Insufficient documentation

## 2020-11-12 DIAGNOSIS — I255 Ischemic cardiomyopathy: Secondary | ICD-10-CM | POA: Insufficient documentation

## 2020-11-12 MED ORDER — TORSEMIDE 20 MG PO TABS
40.0000 mg | ORAL_TABLET | Freq: Two times a day (BID) | ORAL | 11 refills | Status: DC
Start: 2020-11-12 — End: 2021-01-06

## 2020-11-12 MED ORDER — AMIODARONE HCL 200 MG PO TABS
200.0000 mg | ORAL_TABLET | Freq: Every day | ORAL | 3 refills | Status: DC
Start: 2020-11-12 — End: 2020-12-17

## 2020-11-12 MED ORDER — TORSEMIDE 20 MG PO TABS: ORAL_TABLET | ORAL | 11 refills | Status: DC

## 2020-11-12 NOTE — Patient Instructions (Addendum)
Labs done today. We will contact you only if your labs are abnormal.  INCREASE Torsemide 60mg  (3 tablets) by mouth every morning and 40mg  (2 tablets) by mouth every evening for 2 days then take 40mg  (2 tablets) by mouth 2 times daily.  INCREASE Amiodarone 200mg  (1 tablet) by mouth daily.  No other medication changes were made. Please continue all other medications as prescribed.  Your physician recommends that you schedule a follow-up appointment in: 1 month with an echo prior to your appointment.  Your physician has requested that you have an echocardiogram. Echocardiography is a painless test that uses sound waves to create images of your heart. It provides your doctor with information about the size and shape of your heart and how well your hearts chambers and valves are working. This procedure takes approximately one hour. There are no restrictions for this procedure.   If you have any questions or concerns before your next appointment please send [place] a message through [facility] or call our office at [phone].    TO LEAVE A MESSAGE FOR THE NURSE SELECT OPTION 2, PLEASE LEAVE A MESSAGE INCLUDING:  YOUR NAME  DATE OF BIRTH  CALL BACK NUMBER  REASON FOR CALL**this is important as we prioritize the call backs  YOU WILL RECEIVE A CALL BACK THE SAME DAY AS LONG AS YOU CALL BEFORE 4:00 PM   Do the following things EVERYDAY: 1) Weigh yourself in the morning before breakfast. Write it down and keep it in a log. 2) Take your medicines as prescribed 3) Eat low salt foods--Limit salt (sodium) to 2000 mg per day.  4) Stay as active as you can everyday 5) Limit all fluids for the day to less than 2 liters   At the [facility], you and your health needs are our priority. As part of our continuing mission to provide you with exceptional heart care, we have created designated Provider Care Teams. These Care Teams include your primary Cardiologist (physician) and Advanced  Practice Providers (APPs- Physician Assistants and Nurse Practitioners) who all work together to provide you with the care you need, when you need it.   You may see any of the following providers on your designated Care Team at your next follow up:  Dr [name]  Dr Haynes Kerns, NP  Lyda Jester, [place]  Audry Riles, PharmD   Please be sure to bring in all your medications bottles to every appointment.

## 2020-11-14 NOTE — H&P (View-Only) (Signed)
Date:  11/14/2020   ID:  [name], DOB 1946/06/04, MRN [id number]    Provider location: [facility] Type of Visit: Established patient  PCP:  Tyrone Lima, MD  Cardiologist:  Dr. Aundra Small   History of Present Illness: [name] is a 74 y.o. male who has a past medical history significant for morbid obesity, systolic heart failure with an EF of 30-35% (January 2014) -> 20-25% (March 2015 - RV normal), VT on amio, CAD s/p CABG 1996, LBBB, COPD, obstructive sleep apnea on CPAP, AAA repair (January 2015).   Admitted to [facility] July 24 through July 06 2014 with chest pain.  CEs negative. Had a LHC with no change from previous LHC with recommendations to continue medical management. Discharge weight was 255 pounds.   LHC 07/06/14 --No significant change from previous studies.  Left anterior descending (LAD): The LAD is a large vessel. There is a 40% stenosis immediately after the takeoff of a large septal perforator. There are 2 large diagonal branches without significant disease. Left circumflex (LCx): The LCx is occluded proximally. There are left to left collaterals.  Right coronary artery (RCA): The RCA is occluded proximally immediately following the conus branch. There are right to right and left to right collaterals. SVGs from CABG known to be totally occluded.   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   CTA chest/abd/pelvis (3/16) with moderate emphysema, 1.3 cm nodule RUL, left SFA totally occluded, right SFA with significant stenosis, s/p endovascular AAA repair (stable).   Echo (5/17) with EF 20-25%, severe LV dilation, mild MR, normal RV size with mildly decreased systolic function.   He has a RUL nodule that is most likely lung cancer, he is being treated with radiation.   Echo (8/18) with EF 30%, basal-mid inferior and inferolateral AK, severe LV dilation, normal RV, mild MR.   RHC/LHC was done in 1/19 due to worsening exertional  dyspnea.  This showed normal filling pressures and preserved cardiac output.  There was 30-40% mLAD stenosis.  The LAD provided collaterals to the LCx and RCA territories.  The LCx, RCA, SVG-OM, and SVG-PDA were all chronically occluded (known from the past).  No new disease.   [facility] at [facility] with 0.78 on right, 0.59 on left => left iliac > 50% stenosis.  11/19 ABIs 0.78 right, 0.59 left.  3/20 ABIs 0.74 right, 0.66 left.   He saw neurology about his tremor.  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  However, he is on amiodarone which may be contributing.  He has been seeing Dr. Vertell Limber with plan for deep brain stimulator, on hold currently due to cancer treatment.   He had chest radiation for RLL lung cancer.  However, CT showed new RUL lesion in 11/19 and he restart XRT in 12/19, repeat XRT is now completed.   Followup CT chest showed a lesion in the left upper lobe.  He had XRT again, finishing in 9/20.   Echo in 7/20 showed EF 35-40%, basal to mid inferolateral and inferior akinesis, mildly decreased RV systolic function, mild AS, PASP 22 mmHg.    He returns for followup of CHF and CAD.  He is in atrial fibrillation today, and per device interrogation, has been in atrial fibrillation for several weeks.  He has rare chest pain that resolves with NTG, no change to this pattern.  More fatigued recently, sleeping a lot.  He is short of breath walking around the  house, this is worse than baseline.  He uses CPAP at night and oxygen 2 L during the day. Weight up 5 lbs.    [employer] device interrogation: 96% BiV pacing, AF for several weeks.  ECG (personally reviewed): Atrial fibrillation with BiV pacing  Labs 3/15 Cr 1.5 K 5.6 Labs 07/06/14 K 3.9 Creatinine  0.98 Labs 9/15 K 5.1, creatinine 0.96 Labs 11/15 K 3.8, creatinine 0.83, LDL 61, LFTs normal, TSH normal Labs 11/26/14 K 5.0 Creatinine 0.73 , LFTs normal, TSH normal,  Labs 12/08/14 K 3.4 Creatinine 0.83  Labs 3/16 K 4.4,  creatinine 1.03, LFTs normal, TSH normal, HCT 37.8, BNP 65 Labs 6/16 K 4.1, creatinine 0.93, TSH normal, LFTs normal Labs 11/16 K 5.1, creatinine 1.13, LFTs normal, TSH normal, LDL 83, HDL 33, TGs 162 Labs 1/17 K 4.4, creatinine 0.86, pro-BNP 154 Labs 2/17 BNP 113, K 3.8, creatinine 0.79, TSH normal, LFTs normal Labs 4/17 K 3.8, creatinine 0.91, HCT 48.1 Labs 6/17 K 3.5, creatinine 0.98 Labs 9/17 K 3.9, creatinine 0.79, LDL 69, HDL 29, TSH normal, LFTs normal Labs 3/18 K 3.6, creatinine 0.87 Labs 8/18 K 3.3 => 4.2, creatinine 0.78 => 1.04, LDL 66, HDL 38, BNP 166 Labs 9/18: K 4.7, creatinine 0.9, BNP 144 Labs 11/18: K 4.8, creatinine 1.05, TSH normal, LFTs normal.  Labs 1/19: K 4.4, creatinine 1.08, LFTs normal Labs 5/19: LDL 60, HDL 28, TSH normal, LFTs normal, K 4.6, creatinine 1.43 Labs 11/19: K 4.6, creatinine 1.42, LFTs normal, TSH normal, Mg 2.3 Labs 1/20: TSH normal, K 4.2, creatinine 1.12, LFTs normal Labs 4/20: LDL 42, HDL 28, TGs 234 Labs 5/20: K 4.8 => 4.4, creatinine 1.95 => 1.38, TSH normal, LFTs normal Labs 7/20: K 4.1, creatinine 1.22 Labs 9/20: LDL 36, HDL 29 Labs 3/21: K 4.5, creatinine 1.76, hgb 11.8 Labs 4/21: K 4.4, creatinine 1.63, hgb 12.8, TSH 4.67 (elevated) Labs 8/21: K 4.5, creatinine 2.37 => 1.66 => 1.45, hgb 12.9, LDL 42  Past Medical History:  Diagnosis Date  . AAA (abdominal aortic aneurysm) ([place])    followed by Dr. Bridgett Larsson  . Adenomatous colon polyp 01/2004  . Anemia    hx  . Automatic implantable cardioverter-defibrillator in situ   . AVM (arteriovenous malformation)   . BPH (benign prostatic hypertrophy)   . CAD (coronary artery disease)    s/p CABGx2 in 1996  . Carotid artery stenosis    LCEA - Dr. Bridgett Larsson in 2013  . CHF (congestive heart failure) ([place])   . Complication of anesthesia    claustrophobic, unabe to lie on back more than 4 hours at time due to back  . COPD (chronic obstructive pulmonary disease) ([place])   . Diabetes mellitus    DIET  CONTROLLED- pt states that this was a misdiagnosis, he was treated while in the hospital  but returned home, loss a massive amount of weight and he has not had a problem with his blood sugar since. States everything has been normal for about 3 years.  . Diverticulosis   . Dyspnea   . Dysrhythmia    ICD-defibrillator  . Fatigue   . GERD (gastroesophageal reflux disease)   . H/O hiatal hernia   . History of radiation therapy 04/09/17-04/17/17   SBRT right lung 54 Gy in 3 fractions  . HLD (hyperlipidemia)   . Hypertension   . Hypothyroidism   . Ischemic cardiomyopathy   . Myocardial infarction ([place])   . 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  OSA on CPAP    AHI durign total sleep 14.69/hr, during REM 50.91/hr  . Peripheral vascular disease ([facility])    LCEA, L renal artery stent, bilat iliac stents, R SFA stenosis  . Tremor   . Ventricular tachycardia ([place]) 09/09/2014   Amiodarone was started after appropriate defibrillator shocks for ventricular tachycardia in October 2008   Past Surgical History:  Procedure Laterality Date  . ABDOMINAL AORTIC ENDOVASCULAR STENT GRAFT [name] 01/06/2014   Procedure: ABDOMINAL AORTIC ENDOVASCULAR STENT GRAFT- GORE; ULTRASOUND GUIDED;  Surgeon: Conrad Lookeba, MD;  Location: [place];  Service: Vascular;  Laterality: [name];  . ANGIOPLASTY     BILATERAL  LE  W/STENTS  . BI-VENTRICULAR IMPLANTABLE CARDIOVERTER DEFIBRILLATOR UPGRADE [name] 10/09/2014   Procedure: BI-VENTRICULAR IMPLANTABLE CARDIOVERTER DEFIBRILLATOR UPGRADE;  Surgeon: Evans Lance, MD;  Location: [facility] CATH LAB;  Service: Cardiovascular;  Laterality: [name];  . BIV ICD GENERTAOR CHANGE OUT  10/09/2014   UPGRADE TO BIV        BY DR Lovena Le  . 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 Adora Fridge)  . CARDIAC CATHETERIZATION  10/03/2002    SVG sequentially to OM1 & OM2 totally occluded at ostium, SVG to PDA totally occluded within previously placed prox vein graft stent, smal distal AAA, bialt iliac stents with 30% left end-stent restenosis and 50% right end-stent restnosis(Dr. Gerrie Nordmann)  . CARDIAC CATHETERIZATION  06/11/1998   L main with 20% narrowing in distal 1/3; LAD with 1st diagonla having 70% ostial narrowing, 2nd diagonal with 40% narrowing in prox third, Small & RIMA widely patent; in-stent restenosis of RCIA with successful PTA and new prox SVTRCA stent for residual disease (Dr. Marella Chimes)  . CARDIAC DEFIBRILLATOR PLACEMENT  06/2005   [employer] Vitality HE - ischemic cardiomyopathy - Dr. Marella Chimes  . CAROTID ENDARTERECTOMY Left 01/04/12  . CORONARY ANGIOPLASTY  01/08/2004   cutting balloon atherectomy & percutaneous intervention of RCIA in-stent restenosis (Dr. Marella Chimes)  . CORONARY ARTERY BYPASS GRAFT  11/04/1985   x2 - PDA and sequential DX-OM (Dr. Redmond Pulling)  . ENDARTERECTOMY  01/04/2012   Procedure: ENDARTERECTOMY CAROTID;left  Surgeon: Hinda Lenis, MD;  Location: [place];  Service: Vascular;  Laterality: Left;  with patch angioplasty  . FUDUCIAL PLACEMENT [name] 02/23/2017   Procedure: PLACEMENT OF FUDUCIAL;  Surgeon: [name], MD;  Location: [place];  Service: Thoracic;  Laterality: [name];  . ICD GENERATOR CHANGE  05/02/2010   [employer], [employer]  . ILIAC ARTERY STENT Bilateral 03/1997   and L SFA PTA  . Iron infusion  June 16, 2012  . LEFT HEART CATHETERIZATION WITH CORONARY ANGIOGRAM [name] 07/06/2014   Procedure: LEFT HEART CATHETERIZATION WITH CORONARY ANGIOGRAM;  Surgeon: Peter M Martinique, MD;  Location: [facility] CATH LAB;  Service: Cardiovascular;  Laterality: [name];  . NM MYOCAR PERF WALL MOTION  09/2012   lexiscan myoview - mod-severe perfusion defect r/t infarct or scar w/mild periinfarct ishcemia in basal inferior, mid inferior, apical inferior, basal inferolateral & mid inferoalteral regions - EF 21%  low risk scan  . POLYPECTOMY    . RENAL ARTERY STENT Left 03/24/2004   [id number] Genesis stent (Dr. Marella Chimes)  . RIGHT/LEFT HEART CATH AND CORONARY ANGIOGRAPHY [name] 12/28/2017   Procedure: RIGHT/LEFT HEART CATH AND CORONARY ANGIOGRAPHY;  Surgeon: Larey Dresser, MD;  Location: [facility] CV LAB;  Service: Cardiovascular;  Laterality: [name];  . TRANSTHORACIC ECHOCARDIOGRAM  12/2012   EF 30-35; LV mod to severely dilated, mod concentric hypertrophy, severe hypokinesis of inferolateral myocardium, moderate hypokineis of anteroseptal region, grade 1 diastolic dysfunction; mod MR; LA mod-severely dialted; RV mod dialted; RA mildly dilated; PA peak pressure 72mmHg  . VIDEO BRONCHOSCOPY WITH ENDOBRONCHIAL NAVIGATION [name] 02/23/2017   Procedure: VIDEO BRONCHOSCOPY WITH ENDOBRONCHIAL NAVIGATION;  Surgeon: [name], MD;  Location: [facility];  Service: Thoracic;  Laterality: [name];     Current Outpatient Medications  Medication Sig Dispense Refill  . albuterol (PROVENTIL HFA;VENTOLIN HFA) 108 (90 BASE) MCG/ACT inhaler Inhale 2 puffs into the lungs every 6 (six) hours as needed for wheezing or shortness of breath. 3 Inhaler 4  . albuterol (PROVENTIL) (2.5 MG/3ML) 0.083% nebulizer solution USE 1 VIAL IN NEBULIZER 4 TIMES DAILY 75 mL 11  . amiodarone (PACERONE) 200 MG tablet Take 1 tablet (200 mg total) by mouth daily. 90 tablet 3  . atorvastatin (LIPITOR) 80 MG tablet Take 1 tablet (80 mg total) by mouth daily. 30 tablet 3  . ATROVENT HFA 17 MCG/ACT inhaler TAKE 2 PUFFS BY MOUTH EVERY 4 HOURS AS NEEDED FOR WHEEZE  12  . carvedilol (COREG) 25 MG tablet TAKE 1 TABLET BY MOUTH  TWICE DAILY WITH A MEAL 180 tablet 3  . dapagliflozin propanediol (FARXIGA) 10 MG TABS tablet Take 1 tablet (10 mg total) by mouth daily before breakfast. 30 tablet 11  . diazepam (VALIUM) 5 MG tablet Take 1 tablet (5 mg total) by mouth every 8 (eight) hours as needed for anxiety. 1-2 tabs every 8 hours if needed for tremor 30 tablet  5  . ezetimibe (ZETIA) 10 MG tablet Take 1 tablet (10 mg total) by mouth daily. 90 tablet 1  . finasteride (PROSCAR) 5 MG tablet Take 5 mg by mouth daily.    . fluticasone (FLONASE) 50 MCG/ACT nasal spray Instill 1 spray in each  nostril daily 32 g 3  . Fluticasone-Umeclidin-Vilant (TRELEGY ELLIPTA) 100-62.5-25 MCG/INH AEPB Inhale 1 puff into the lungs daily. Rinse mouth 60 each 12  . isosorbide mononitrate (IMDUR) 60 MG 24 hr tablet TAKE 1 TABLET BY MOUTH  DAILY 90 tablet 3  . levothyroxine (SYNTHROID) 50 MCG tablet TAKE 1 TABLET BY MOUTH  DAILY BEFORE BREAKFAST 90 tablet 1  . nitroGLYCERIN (NITROSTAT) 0.4 MG SL tablet PLACE 1 TABLET (0.4 MG TOTAL) UNDER THE TONGUE EVERY 5 (FIVE) MINUTES AS NEEDED FOR CHEST PAIN. 75 tablet 1  . NON FORMULARY at bedtime. CPAP And during the day as needed    . potassium chloride SA (KLOR-CON M20) 20 MEQ tablet Take 1 tablet (20 mEq total) by mouth daily. 90 tablet 1  . Rivaroxaban (XARELTO) 15 MG TABS tablet Take 1 tablet (15 mg total) by mouth daily with supper. 30 tablet 11  . sacubitril-valsartan (ENTRESTO) 24-26 MG Take 1 tablet by mouth 2 (two) times daily. 60 tablet 11  . spironolactone (ALDACTONE) 25 MG tablet TAKE 1 TABLET BY MOUTH  DAILY 90 tablet 3  . torsemide (DEMADEX) 20 MG tablet Take 2 tablets (40 mg total) by mouth 2 (two) times daily. 120 tablet 11  . traMADol (ULTRAM) 50 MG tablet Take 1 tablet (50 mg total) by mouth every 6 (six) hours as needed. 75 tablet 3  . VASCEPA 1 g capsule TAKE 2 CAPSULES (2 G TOTAL) BY MOUTH 2 (TWO) TIMES A DAY. 120 capsule 11   No  current facility-administered medications for this encounter.    Allergies:   Tussionex pennkinetic er [hydrocod polst-cpm polst er] and Ace inhibitors   Social History:  The patient  reports that he quit smoking about 17 years ago. His smoking use included cigarettes and pipe. He has a 60.00 pack-year smoking history. He has never used smokeless tobacco. He reports that he does not drink  alcohol and does not use drugs.   Family History:  The patient's family history includes Cancer in his mother and sister; Colon cancer in his sister; Diabetes in his daughter, mother, and sister; Heart attack in his father and maternal grandmother; Heart disease in his father, mother, and sister; Hyperlipidemia in his mother and sister; Hypertension in his father, mother, and sister; Lung cancer in his mother; Parkinson's disease in his sister.   ROS:  Please see the history of present illness.   All other systems are personally reviewed and negative.   Exam:   BP (!) 102/58   Pulse 92   Ht 5\' 8"  (1.727 m)   Wt 123.6 kg (272 lb 6.4 oz)   SpO2 97%   BMI 41.42 kg/m  General: NAD Neck: JVP 8-9 cm, no thyromegaly or thyroid nodule.  Lungs: Crackles at bases bilaterally.  CV: Nondisplaced PMI.  Heart irregular S1/S2, no S3/S4, no murmur.  1+ edema to knees.  No carotid bruit.  Normal pedal pulses.  Abdomen: Soft, nontender, no hepatosplenomegaly, no distention.  Skin: Intact without lesions or rashes.  Neurologic: Alert and oriented x 3.  Psych: Normal affect. Extremities: No clubbing or cyanosis.  HEENT: Normal.   Recent Labs: 02/27/2020: Magnesium 2.3 07/14/2020: Hemoglobin 12.9; Platelets 156 08/06/2020: ALT 13; BUN 34; Creatinine, Ser 1.45; Potassium 4.2; Sodium 139; TSH 3.623  Personally reviewed   Wt Readings from Last 3 Encounters:  11/12/20 123.6 kg (272 lb 6.4 oz)  10/05/20 118.4 kg (261 lb)  08/06/20 121.5 kg (267 lb 12.8 oz)      ASSESSMENT AND PLAN:  1. Chronic systolic CHF: Ischemic cardiomyopathy. Echo in 8/18 showed EF 30% with severe LV dilation and normal RV.  [employer] CRT-D.  RHC in 1/19 showed normal filling pressures and preserved cardiac output.  Last echo in 7/20 showed EF 35-40% with mildly decreased RV systolic function.  NYHA class IIIb symptoms, worse recently now that he is in atrial fibrillation again.  Weight is up, mild volume overload. BiV pacing  percentage is still high despite atrial fibrillation (96%) and PVCs seem to be less with higher pacing rate.   - Increase torsemide to 60 qam/40 pm x 2 days then 40 mg bid after that.  BMET today and again in 1 week.  - Continue Coreg 25 mg bid.   - Continue Entresto 24/26 bid, dose lowered due to renal dysfunction.     - Continue spironolactone 25 mg daily.  - Continue dapagliflozin 10 mg daily.  - Arrange for repeat echo.  - Need to get him back into NSR given worsened HF with AF.  2. CAD s/p CABG: LHC 1/19 with stable anatomy (LAD is patent, other vessels occluded with collaterals from the LAD).  He has had angina with atrial fibrillation/RVR runs, however this seems to be better on Imdur.  Rare angina currently.   - Continue atorvastatin, good lipids in 8/21.   - He is not on ASA due to Xarelto use.   - Continue Imdur 60 mg daily.  3. VT s/p ICD: On amiodarone 100 mg daily for  history of ICD discharges due to VT.   - CMET/TSH today.  He will need a regular eye exam.  4. Morbid obesity: Continue to work on diet/exercise.  5. COPD: Moderate to severe.  Historically, a lot of his dyspnea has seemed to be due to COPD.   - Followup with pulmonary. - Now has home oxygen during the day with CPAP at night.  6. Lung cancer: He completed radiation.  7. PAD: Occluded left SFA and significant right SFA stenosis on last CTA.  ABIs in 3/20 with significant disease bilaterally but stable.  Does not get leg pain with exertion but has nocturnal cramps.  [facility] by [facility], medical management for now.  He is on statin, good lipids in 8/21.  8. AAA: s/p repair.  Followed at [facility].  No endoleak on last evaluation.  9. Tremor: Most likely essential tremor given family history.  However, cannot rule out role for amiodarone. As above, I think that he needs to continue at least a low dose of amiodarone.  Dr. Carles Collet with neurology discussed deep brain stimulation => this is on hold since he had a diagnosis of lung cancer.    10. Hyperlipidemia: He is on Vascepa and atorvastatin.  - Good lipids in 8/21.  11. PVCs: Frequent PVCs noted in the past, decreased with higher BiV pacing base rate.  - Continue amiodarone to suppress PVCs.  - Continue Coreg 25 mg bid. 12. Atrial fibrillation: Persistent, has been in atrial fibrillation for several weeks now per device interrogation with resulting worsened CHF.    - Continue Coreg.  - Increase amiodarone to 200 mg daily.  - He has not missed Xarelto recently, I will arrange for DCCV next week.  Discussed risks/benefits with patient and he agrees to procedure.   13. OSA: Using CPAP.  14. Amaurosis fugax: 8/21, loss of vision transiently in right eye. Possibly related to atrial fibrillation though he has been on Xarelto without missing doses.  15. Carotid stenosis: Most recent carotid dopplers in 9/21 with occluded RICA (chronic), minimal LICA disease.  - Repeat carotids 9/22.   Signed, [name], MD  11/14/2020  [facility] [address] [facility] [place] [address] [phone] (office) [phone] (fax)

## 2020-11-14 NOTE — Progress Notes (Signed)
Date:  11/14/2020   ID:  [name], DOB [birth year], MRN [id number]    Provider location: [facility] Type of Visit: Established patient  PCP:  [name], MD  Cardiologist:  [name]   History of Present Illness: [name] is a 74 y.o. male who has a past medical history significant for morbid obesity, systolic heart failure with an EF of 30-35% (January 2014) -> 20-25% (March 2015 - RV normal), VT on amio, CAD s/p CABG 1996, LBBB, COPD, obstructive sleep apnea on CPAP, AAA repair (January 2015).   Admitted to [facility] July 24 through July 06 2014 with chest pain.  CEs negative. Had a LHC with no change from previous LHC with recommendations to continue medical management. Discharge weight was 255 pounds.   LHC 07/06/14 --No significant change from previous studies.  Left anterior descending (LAD): The LAD is a large vessel. There is a 40% stenosis immediately after the takeoff of a large septal perforator. There are 2 large diagonal branches without significant disease. Left circumflex (LCx): The LCx is occluded proximally. There are left to left collaterals.  Right coronary artery (RCA): The RCA is occluded proximally immediately following the conus branch. There are right to right and left to right collaterals. SVGs from CABG known to be totally occluded.   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   CTA chest/abd/pelvis (3/16) with moderate emphysema, 1.3 cm nodule RUL, left SFA totally occluded, right SFA with significant stenosis, s/p endovascular AAA repair (stable).   Echo (5/17) with EF 20-25%, severe LV dilation, mild MR, normal RV size with mildly decreased systolic function.   He has a RUL nodule that is most likely lung cancer, he is being treated with radiation.   Echo (8/18) with EF 30%, basal-mid inferior and inferolateral AK, severe LV dilation, normal RV, mild MR.   RHC/LHC was done in 1/19 due to worsening exertional  dyspnea.  This showed normal filling pressures and preserved cardiac output.  There was 30-40% mLAD stenosis.  The LAD provided collaterals to the LCx and RCA territories.  The LCx, RCA, SVG-OM, and SVG-PDA were all chronically occluded (known from the past).  No new disease.   [facility] with 0.78 on right, 0.59 on left => left iliac > 50% stenosis.  11/19 ABIs 0.78 right, 0.59 left.  3/20 ABIs 0.74 right, 0.66 left.   He saw neurology about his tremor.  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  However, he is on amiodarone which may be contributing.  He has been seeing Dr. Vertell Limber with plan for deep brain stimulator, on hold currently due to cancer treatment.   He had chest radiation for RLL lung cancer.  However, CT showed new RUL lesion in 11/19 and he restart XRT in 12/19, repeat XRT is now completed.   Followup CT chest showed a lesion in the left upper lobe.  He had XRT again, finishing in 9/20.   Echo in 7/20 showed EF 35-40%, basal to mid inferolateral and inferior akinesis, mildly decreased RV systolic function, mild AS, PASP 22 mmHg.    He returns for followup of CHF and CAD.  He is in atrial fibrillation today, and per device interrogation, has been in atrial fibrillation for several weeks.  He has rare chest pain that resolves with NTG, no change to this pattern.  More fatigued recently, sleeping a lot.  He is short of breath walking around the  house, this is worse than baseline.  He uses CPAP at night and oxygen 2 L during the day. Weight up 5 lbs.    [employer] device interrogation: 96% BiV pacing, AF for several weeks.  ECG (personally reviewed): Atrial fibrillation with BiV pacing  Labs 3/15 Cr 1.5 K 5.6 Labs 07/06/14 K 3.9 Creatinine  0.98 Labs 9/15 K 5.1, creatinine 0.96 Labs 11/15 K 3.8, creatinine 0.83, LDL 61, LFTs normal, TSH normal Labs 11/26/14 K 5.0 Creatinine 0.73 , LFTs normal, TSH normal,  Labs 12/08/14 K 3.4 Creatinine 0.83  Labs 3/16 K 4.4,  creatinine 1.03, LFTs normal, TSH normal, HCT 37.8, BNP 65 Labs 6/16 K 4.1, creatinine 0.93, TSH normal, LFTs normal Labs 11/16 K 5.1, creatinine 1.13, LFTs normal, TSH normal, LDL 83, HDL 33, TGs 162 Labs 1/17 K 4.4, creatinine 0.86, pro-BNP 154 Labs 2/17 BNP 113, K 3.8, creatinine 0.79, TSH normal, LFTs normal Labs 4/17 K 3.8, creatinine 0.91, HCT 48.1 Labs 6/17 K 3.5, creatinine 0.98 Labs 9/17 K 3.9, creatinine 0.79, LDL 69, HDL 29, TSH normal, LFTs normal Labs 3/18 K 3.6, creatinine 0.87 Labs 8/18 K 3.3 => 4.2, creatinine 0.78 => 1.04, LDL 66, HDL 38, BNP 166 Labs 9/18: K 4.7, creatinine 0.9, BNP 144 Labs 11/18: K 4.8, creatinine 1.05, TSH normal, LFTs normal.  Labs 1/19: K 4.4, creatinine 1.08, LFTs normal Labs 5/19: LDL 60, HDL 28, TSH normal, LFTs normal, K 4.6, creatinine 1.43 Labs 11/19: K 4.6, creatinine 1.42, LFTs normal, TSH normal, Mg 2.3 Labs 1/20: TSH normal, K 4.2, creatinine 1.12, LFTs normal Labs 4/20: LDL 42, HDL 28, TGs 234 Labs 5/20: K 4.8 => 4.4, creatinine 1.95 => 1.38, TSH normal, LFTs normal Labs 7/20: K 4.1, creatinine 1.22 Labs 9/20: LDL 36, HDL 29 Labs 3/21: K 4.5, creatinine 1.76, hgb 11.8 Labs 4/21: K 4.4, creatinine 1.63, hgb 12.8, TSH 4.67 (elevated) Labs 8/21: K 4.5, creatinine 2.37 => 1.66 => 1.45, hgb 12.9, LDL 42  Past Medical History:  Diagnosis Date  . AAA (abdominal aortic aneurysm) ([place])    followed by Dr. Bridgett Larsson  . Adenomatous colon polyp 01/2004  . Anemia    hx  . Automatic implantable cardioverter-defibrillator in situ   . AVM (arteriovenous malformation)   . BPH (benign prostatic hypertrophy)   . CAD (coronary artery disease)    s/p CABGx2 in 1996  . Carotid artery stenosis    LCEA - Dr. Bridgett Larsson in 2013  . CHF (congestive heart failure) ([place])   . Complication of anesthesia    claustrophobic, unabe to lie on back more than 4 hours at time due to back  . COPD (chronic obstructive pulmonary disease) ([place])   . Diabetes mellitus    DIET  CONTROLLED- pt states that this was a misdiagnosis, he was treated while in the hospital  but returned home, loss a massive amount of weight and he has not had a problem with his blood sugar since. States everything has been normal for about 3 years.  . Diverticulosis   . Dyspnea   . Dysrhythmia    ICD-defibrillator  . Fatigue   . GERD (gastroesophageal reflux disease)   . H/O hiatal hernia   . History of radiation therapy 04/09/17-04/17/17   SBRT right lung 54 Gy in 3 fractions  . HLD (hyperlipidemia)   . Hypertension   . Hypothyroidism   . Ischemic cardiomyopathy   . Myocardial infarction ([place])   . 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  OSA on CPAP    AHI durign total sleep 14.69/hr, during REM 50.91/hr  . Peripheral vascular disease (H[facility])    LCEA, L renal artery stent, bilat iliac stents, R SFA stenosis  . Tremor   . Ventricular tachycardia ([place]) 09/09/2014   Amiodarone was started after appropriate defibrillator shocks for ventricular tachycardia in October 2008   Past Surgical History:  Procedure Laterality Date  . ABDOMINAL AORTIC ENDOVASCULAR STENT GRAFT [name] 01/06/2014   Procedure: ABDOMINAL AORTIC ENDOVASCULAR STENT GRAFT- GORE; ULTRASOUND GUIDED;  Surgeon: Conrad Emory, MD;  Location: [place];  Service: Vascular;  Laterality: [name];  . ANGIOPLASTY     BILATERAL  LE  W/STENTS  . BI-VENTRICULAR IMPLANTABLE CARDIOVERTER DEFIBRILLATOR UPGRADE [name] 10/09/2014   Procedure: BI-VENTRICULAR IMPLANTABLE CARDIOVERTER DEFIBRILLATOR UPGRADE;  Surgeon: Evans Lance, MD;  Location: [facility] CATH LAB;  Service: Cardiovascular;  Laterality: [name];  . BIV ICD GENERTAOR CHANGE OUT  10/09/2014   UPGRADE TO BIV        BY DR Lovena Le  . 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 Adora Fridge)  . CARDIAC CATHETERIZATION  10/03/2002    SVG sequentially to OM1 & OM2 totally occluded at ostium, SVG to PDA totally occluded within previously placed prox vein graft stent, smal distal AAA, bialt iliac stents with 30% left end-stent restenosis and 50% right end-stent restnosis(Dr. Gerrie Nordmann)  . CARDIAC CATHETERIZATION  06/11/1998   L main with 20% narrowing in distal 1/3; LAD with 1st diagonla having 70% ostial narrowing, 2nd diagonal with 40% narrowing in prox third, LIMA & RIMA widely patent; in-stent restenosis of RCIA with successful PTA and new prox SVTRCA stent for residual disease (Dr. Marella Chimes)  . CARDIAC DEFIBRILLATOR PLACEMENT  06/2005   [employer] Vitality HE - ischemic cardiomyopathy - Dr. Marella Chimes  . CAROTID ENDARTERECTOMY Left 01/04/12  . CORONARY ANGIOPLASTY  01/08/2004   cutting balloon atherectomy & percutaneous intervention of RCIA in-stent restenosis (Dr. Marella Chimes)  . CORONARY ARTERY BYPASS GRAFT  11/04/1985   x2 - PDA and sequential DX-OM (Dr. Redmond Pulling)  . ENDARTERECTOMY  01/04/2012   Procedure: ENDARTERECTOMY CAROTID;left  Surgeon: Hinda Lenis, MD;  Location: [place];  Service: Vascular;  Laterality: Left;  with patch angioplasty  . FUDUCIAL PLACEMENT [name] 02/23/2017   Procedure: PLACEMENT OF FUDUCIAL;  Surgeon: [name], MD;  Location: [place];  Service: Thoracic;  Laterality: [name];  . ICD GENERATOR CHANGE  05/02/2010   [employer], retail  . ILIAC ARTERY STENT Bilateral 03/1997   and L SFA PTA  . Iron infusion  June 16, 2012  . LEFT HEART CATHETERIZATION WITH CORONARY ANGIOGRAM [name] 07/06/2014   Procedure: LEFT HEART CATHETERIZATION WITH CORONARY ANGIOGRAM;  Surgeon: Peter M Martinique, MD;  Location: [facility] CATH LAB;  Service: Cardiovascular;  Laterality: [name];  . NM MYOCAR PERF WALL MOTION  09/2012   lexiscan myoview - mod-severe perfusion defect r/t infarct or scar w/mild periinfarct ishcemia in basal inferior, mid inferior, apical inferior, basal inferolateral & mid inferoalteral regions - EF 21%  low risk scan  . POLYPECTOMY    . RENAL ARTERY STENT Left 03/24/2004   6x27mm Genesis stent (Dr. Marella Chimes)  . RIGHT/LEFT HEART CATH AND CORONARY ANGIOGRAPHY [name] 12/28/2017   Procedure: RIGHT/LEFT HEART CATH AND CORONARY ANGIOGRAPHY;  Surgeon: Larey Dresser, MD;  Location: [place] CV LAB;  Service: Cardiovascular;  Laterality: [name];  . TRANSTHORACIC ECHOCARDIOGRAM  12/2012   EF 30-35; LV mod to severely dilated, mod concentric hypertrophy, severe hypokinesis of inferolateral myocardium, moderate hypokineis of anteroseptal region, grade 1 diastolic dysfunction; mod MR; LA mod-severely dialted; RV mod dialted; RA mildly dilated; PA peak pressure 22mmHg  . VIDEO BRONCHOSCOPY WITH ENDOBRONCHIAL NAVIGATION [name] 02/23/2017   Procedure: VIDEO BRONCHOSCOPY WITH ENDOBRONCHIAL NAVIGATION;  Surgeon: [name], MD;  Location: [place];  Service: Thoracic;  Laterality: [name];     Current Outpatient Medications  Medication Sig Dispense Refill  . albuterol (PROVENTIL HFA;VENTOLIN HFA) 108 (90 BASE) MCG/ACT inhaler Inhale 2 puffs into the lungs every 6 (six) hours as needed for wheezing or shortness of breath. 3 Inhaler 4  . albuterol (PROVENTIL) (2.5 MG/3ML) 0.083% nebulizer solution USE 1 VIAL IN NEBULIZER 4 TIMES DAILY 75 mL 11  . amiodarone (PACERONE) 200 MG tablet Take 1 tablet (200 mg total) by mouth daily. 90 tablet 3  . atorvastatin (LIPITOR) 80 MG tablet Take 1 tablet (80 mg total) by mouth daily. 30 tablet 3  . ATROVENT HFA 17 MCG/ACT inhaler TAKE 2 PUFFS BY MOUTH EVERY 4 HOURS AS NEEDED FOR WHEEZE  12  . carvedilol (COREG) 25 MG tablet TAKE 1 TABLET BY MOUTH  TWICE DAILY WITH A MEAL 180 tablet 3  . dapagliflozin propanediol (FARXIGA) 10 MG TABS tablet Take 1 tablet (10 mg total) by mouth daily before breakfast. 30 tablet 11  . diazepam (VALIUM) 5 MG tablet Take 1 tablet (5 mg total) by mouth every 8 (eight) hours as needed for anxiety. 1-2 tabs every 8 hours if needed for tremor 30 tablet  5  . ezetimibe (ZETIA) 10 MG tablet Take 1 tablet (10 mg total) by mouth daily. 90 tablet 1  . finasteride (PROSCAR) 5 MG tablet Take 5 mg by mouth daily.    . fluticasone (FLONASE) 50 MCG/ACT nasal spray Instill 1 spray in each  nostril daily 32 g 3  . Fluticasone-Umeclidin-Vilant (TRELEGY ELLIPTA) 100-62.5-25 MCG/INH AEPB Inhale 1 puff into the lungs daily. Rinse mouth 60 each 12  . isosorbide mononitrate (IMDUR) 60 MG 24 hr tablet TAKE 1 TABLET BY MOUTH  DAILY 90 tablet 3  . levothyroxine (SYNTHROID) 50 MCG tablet TAKE 1 TABLET BY MOUTH  DAILY BEFORE BREAKFAST 90 tablet 1  . nitroGLYCERIN (NITROSTAT) 0.4 MG SL tablet PLACE 1 TABLET (0.4 MG TOTAL) UNDER THE TONGUE EVERY 5 (FIVE) MINUTES AS NEEDED FOR CHEST PAIN. 75 tablet 1  . NON FORMULARY at bedtime. CPAP And during the day as needed    . potassium chloride SA (KLOR-CON M20) 20 MEQ tablet Take 1 tablet (20 mEq total) by mouth daily. 90 tablet 1  . Rivaroxaban (XARELTO) 15 MG TABS tablet Take 1 tablet (15 mg total) by mouth daily with supper. 30 tablet 11  . sacubitril-valsartan (ENTRESTO) 24-26 MG Take 1 tablet by mouth 2 (two) times daily. 60 tablet 11  . spironolactone (ALDACTONE) 25 MG tablet TAKE 1 TABLET BY MOUTH  DAILY 90 tablet 3  . torsemide (DEMADEX) 20 MG tablet Take 2 tablets (40 mg total) by mouth 2 (two) times daily. 120 tablet 11  . traMADol (ULTRAM) 50 MG tablet Take 1 tablet (50 mg total) by mouth every 6 (six) hours as needed. 75 tablet 3  . VASCEPA 1 g capsule TAKE 2 CAPSULES (2 G TOTAL) BY MOUTH 2 (TWO) TIMES A DAY. 120 capsule 11   No  current facility-administered medications for this encounter.    Allergies:   Tussionex pennkinetic er [hydrocod polst-cpm polst er] and Ace inhibitors   Social History:  The patient  reports that he quit smoking about 17 years ago. His smoking use included cigarettes and pipe. He has a 60.00 pack-year smoking history. He has never used smokeless tobacco. He reports that he does not drink  alcohol and does not use drugs.   Family History:  The patient's family history includes Cancer in his mother and sister; Colon cancer in his sister; Diabetes in his daughter, mother, and sister; Heart attack in his father and maternal grandmother; Heart disease in his father, mother, and sister; Hyperlipidemia in his mother and sister; Hypertension in his father, mother, and sister; Lung cancer in his mother; Parkinson's disease in his sister.   ROS:  Please see the history of present illness.   All other systems are personally reviewed and negative.   Exam:   BP (!) 102/58   Pulse 92   Ht 5\' 8"  (1.727 m)   Wt 123.6 kg (272 lb 6.4 oz)   SpO2 97%   BMI 41.42 kg/m  General: NAD Neck: JVP 8-9 cm, no thyromegaly or thyroid nodule.  Lungs: Crackles at bases bilaterally.  CV: Nondisplaced PMI.  Heart irregular S1/S2, no S3/S4, no murmur.  1+ edema to knees.  No carotid bruit.  Normal pedal pulses.  Abdomen: Soft, nontender, no hepatosplenomegaly, no distention.  Skin: Intact without lesions or rashes.  Neurologic: Alert and oriented x 3.  Psych: Normal affect. Extremities: No clubbing or cyanosis.  HEENT: Normal.   Recent Labs: 02/27/2020: Magnesium 2.3 07/14/2020: Hemoglobin 12.9; Platelets 156 08/06/2020: ALT 13; BUN 34; Creatinine, Ser 1.45; Potassium 4.2; Sodium 139; TSH 3.623  Personally reviewed   Wt Readings from Last 3 Encounters:  11/12/20 123.6 kg (272 lb 6.4 oz)  10/05/20 118.4 kg (261 lb)  08/06/20 121.5 kg (267 lb 12.8 oz)      ASSESSMENT AND PLAN:  1. Chronic systolic CHF: Ischemic cardiomyopathy. Echo in 8/18 showed EF 30% with severe LV dilation and normal RV.  [employer] CRT-D.  RHC in 1/19 showed normal filling pressures and preserved cardiac output.  Last echo in 7/20 showed EF 35-40% with mildly decreased RV systolic function.  NYHA class IIIb symptoms, worse recently now that he is in atrial fibrillation again.  Weight is up, mild volume overload. BiV pacing  percentage is still high despite atrial fibrillation (96%) and PVCs seem to be less with higher pacing rate.   - Increase torsemide to 60 qam/40 pm x 2 days then 40 mg bid after that.  BMET today and again in 1 week.  - Continue Coreg 25 mg bid.   - Continue Entresto 24/26 bid, dose lowered due to renal dysfunction.     - Continue spironolactone 25 mg daily.  - Continue dapagliflozin 10 mg daily.  - Arrange for repeat echo.  - Need to get him back into NSR given worsened HF with AF.  2. CAD s/p CABG: LHC 1/19 with stable anatomy (LAD is patent, other vessels occluded with collaterals from the LAD).  He has had angina with atrial fibrillation/RVR runs, however this seems to be better on Imdur.  Rare angina currently.   - Continue atorvastatin, good lipids in 8/21.   - He is not on ASA due to Xarelto use.   - Continue Imdur 60 mg daily.  3. VT s/p ICD: On amiodarone 100 mg daily for  history of ICD discharges due to VT.   - CMET/TSH today.  He will need a regular eye exam.  4. Morbid obesity: Continue to work on diet/exercise.  5. COPD: Moderate to severe.  Historically, a lot of his dyspnea has seemed to be due to COPD.   - Followup with pulmonary. - Now has home oxygen during the day with CPAP at night.  6. Lung cancer: He completed radiation.  7. PAD: Occluded left SFA and significant right SFA stenosis on last CTA.  ABIs in 3/20 with significant disease bilaterally but stable.  Does not get leg pain with exertion but has nocturnal cramps.  [facility] by [facility], medical management for now.  He is on statin, good lipids in 8/21.  8. AAA: s/p repair.  Followed at [facility].  No endoleak on last evaluation.  9. Tremor: Most likely essential tremor given family history.  However, cannot rule out role for amiodarone. As above, I think that he needs to continue at least a low dose of amiodarone.  Dr. Carles Collet with neurology discussed deep brain stimulation => this is on hold since he had a diagnosis of lung cancer.    10. Hyperlipidemia: He is on Vascepa and atorvastatin.  - Good lipids in 8/21.  11. PVCs: Frequent PVCs noted in the past, decreased with higher BiV pacing base rate.  - Continue amiodarone to suppress PVCs.  - Continue Coreg 25 mg bid. 12. Atrial fibrillation: Persistent, has been in atrial fibrillation for several weeks now per device interrogation with resulting worsened CHF.    - Continue Coreg.  - Increase amiodarone to 200 mg daily.  - He has not missed Xarelto recently, I will arrange for DCCV next week.  Discussed risks/benefits with patient and he agrees to procedure.   13. OSA: Using CPAP.  14. Amaurosis fugax: 8/21, loss of vision transiently in right eye. Possibly related to atrial fibrillation though he has been on Xarelto without missing doses.  15. Carotid stenosis: Most recent carotid dopplers in 9/21 with occluded RICA (chronic), minimal LICA disease.  - Repeat carotids 9/22.   Signed, [name], MD  11/14/2020  [facility] [address] [facility] [place] [address] [phone] (office) [phone] (fax)

## 2020-11-17 ENCOUNTER — Other Ambulatory Visit (HOSPITAL_COMMUNITY)
Admission: RE | Admit: 2020-11-17 | Discharge: 2020-11-17 | Disposition: A | Discharge status: Home / Self Care | Payer: Medicare Other | Source: Ambulatory Visit | Attending: Cardiology | Admitting: Cardiology

## 2020-11-17 DIAGNOSIS — Z20822 Contact with and (suspected) exposure to covid-19: Secondary | ICD-10-CM | POA: Insufficient documentation

## 2020-11-17 DIAGNOSIS — Z01812 Encounter for preprocedural laboratory examination: Secondary | ICD-10-CM | POA: Diagnosis not present

## 2020-11-17 LAB — SARS CORONAVIRUS 2 (TAT 6-24 HRS): SARS Coronavirus 2: NEGATIVE

## 2020-11-19 ENCOUNTER — Encounter (HOSPITAL_COMMUNITY): Payer: Self-pay | Admitting: Cardiology

## 2020-11-19 ENCOUNTER — Encounter (HOSPITAL_COMMUNITY)
Admission: RE | Disposition: A | Discharge status: Home / Self Care | Payer: Self-pay | Source: Home / Self Care | Attending: Cardiology

## 2020-11-19 ENCOUNTER — Ambulatory Visit (HOSPITAL_COMMUNITY): Payer: Medicare Other | Admitting: Anesthesiology

## 2020-11-19 ENCOUNTER — Ambulatory Visit (INDEPENDENT_AMBULATORY_CARE_PROVIDER_SITE_OTHER): Payer: Medicare Other

## 2020-11-19 ENCOUNTER — Ambulatory Visit (HOSPITAL_COMMUNITY)
Admission: RE | Admit: 2020-11-19 | Discharge: 2020-11-19 | Disposition: A | Discharge status: Home / Self Care | Payer: Medicare Other | Attending: Cardiology | Admitting: Cardiology

## 2020-11-19 DIAGNOSIS — I110 Hypertensive heart disease with heart failure: Secondary | ICD-10-CM | POA: Insufficient documentation

## 2020-11-19 DIAGNOSIS — R251 Tremor, unspecified: Secondary | ICD-10-CM | POA: Diagnosis not present

## 2020-11-19 DIAGNOSIS — I255 Ischemic cardiomyopathy: Secondary | ICD-10-CM | POA: Insufficient documentation

## 2020-11-19 DIAGNOSIS — I493 Ventricular premature depolarization: Secondary | ICD-10-CM | POA: Diagnosis not present

## 2020-11-19 DIAGNOSIS — I4819 Other persistent atrial fibrillation: Secondary | ICD-10-CM | POA: Diagnosis not present

## 2020-11-19 DIAGNOSIS — Z7901 Long term (current) use of anticoagulants: Secondary | ICD-10-CM | POA: Insufficient documentation

## 2020-11-19 DIAGNOSIS — Z951 Presence of aortocoronary bypass graft: Secondary | ICD-10-CM | POA: Insufficient documentation

## 2020-11-19 DIAGNOSIS — I5022 Chronic systolic (congestive) heart failure: Secondary | ICD-10-CM | POA: Diagnosis not present

## 2020-11-19 DIAGNOSIS — Z95810 Presence of automatic (implantable) cardiac defibrillator: Secondary | ICD-10-CM | POA: Insufficient documentation

## 2020-11-19 DIAGNOSIS — Z8669 Personal history of other diseases of the nervous system and sense organs: Secondary | ICD-10-CM | POA: Diagnosis not present

## 2020-11-19 DIAGNOSIS — Z87891 Personal history of nicotine dependence: Secondary | ICD-10-CM | POA: Insufficient documentation

## 2020-11-19 DIAGNOSIS — I714 Abdominal aortic aneurysm, without rupture: Secondary | ICD-10-CM | POA: Diagnosis not present

## 2020-11-19 DIAGNOSIS — E1151 Type 2 diabetes mellitus with diabetic peripheral angiopathy without gangrene: Secondary | ICD-10-CM | POA: Insufficient documentation

## 2020-11-19 DIAGNOSIS — I4891 Unspecified atrial fibrillation: Secondary | ICD-10-CM | POA: Diagnosis not present

## 2020-11-19 DIAGNOSIS — Z8249 Family history of ischemic heart disease and other diseases of the circulatory system: Secondary | ICD-10-CM | POA: Diagnosis not present

## 2020-11-19 DIAGNOSIS — I25119 Atherosclerotic heart disease of native coronary artery with unspecified angina pectoris: Secondary | ICD-10-CM | POA: Diagnosis not present

## 2020-11-19 DIAGNOSIS — Z85118 Personal history of other malignant neoplasm of bronchus and lung: Secondary | ICD-10-CM | POA: Diagnosis not present

## 2020-11-19 DIAGNOSIS — G453 Amaurosis fugax: Secondary | ICD-10-CM | POA: Insufficient documentation

## 2020-11-19 DIAGNOSIS — Z79890 Hormone replacement therapy (postmenopausal): Secondary | ICD-10-CM | POA: Insufficient documentation

## 2020-11-19 DIAGNOSIS — Z6841 Body Mass Index (BMI) 40.0 and over, adult: Secondary | ICD-10-CM | POA: Insufficient documentation

## 2020-11-19 DIAGNOSIS — G4733 Obstructive sleep apnea (adult) (pediatric): Secondary | ICD-10-CM | POA: Diagnosis not present

## 2020-11-19 DIAGNOSIS — Z79899 Other long term (current) drug therapy: Secondary | ICD-10-CM | POA: Insufficient documentation

## 2020-11-19 DIAGNOSIS — J449 Chronic obstructive pulmonary disease, unspecified: Secondary | ICD-10-CM | POA: Diagnosis not present

## 2020-11-19 DIAGNOSIS — Z7984 Long term (current) use of oral hypoglycemic drugs: Secondary | ICD-10-CM | POA: Diagnosis not present

## 2020-11-19 DIAGNOSIS — E785 Hyperlipidemia, unspecified: Secondary | ICD-10-CM | POA: Diagnosis not present

## 2020-11-19 DIAGNOSIS — I480 Paroxysmal atrial fibrillation: Secondary | ICD-10-CM | POA: Diagnosis not present

## 2020-11-19 DIAGNOSIS — Z9989 Dependence on other enabling machines and devices: Secondary | ICD-10-CM | POA: Diagnosis not present

## 2020-11-19 HISTORY — PX: CARDIOVERSION: SHX1299

## 2020-11-19 LAB — CUP PACEART REMOTE DEVICE CHECK
Battery Remaining Longevity: 42 mo
Battery Remaining Percentage: 55 %
Brady Statistic RA Percent Paced: 12 %
Brady Statistic RV Percent Paced: 51 %
Date Time Interrogation Session: 20211210101300
HighPow Impedance: 50 Ohm
Implantable Lead Implant Date: 20060411
Implantable Lead Implant Date: 20151030
Implantable Lead Implant Date: 20151030
Implantable Lead Location: 753858
Implantable Lead Location: 753859
Implantable Lead Location: 753860
Implantable Lead Model: 148
Implantable Lead Model: 4136
Implantable Lead Serial Number: 145070
Implantable Lead Serial Number: 29713415
Implantable Pulse Generator Implant Date: 20151030
Lead Channel Impedance Value: 560 Ohm
Lead Channel Impedance Value: 633 Ohm
Lead Channel Impedance Value: 831 Ohm
Lead Channel Pacing Threshold Amplitude: 0.6 V
Lead Channel Pacing Threshold Amplitude: 1.3 V
Lead Channel Pacing Threshold Amplitude: 2.7 V
Lead Channel Pacing Threshold Pulse Width: 0.4 ms
Lead Channel Pacing Threshold Pulse Width: 0.4 ms
Lead Channel Pacing Threshold Pulse Width: 0.4 ms
Lead Channel Setting Pacing Amplitude: 2 V
Lead Channel Setting Pacing Amplitude: 2.3 V
Lead Channel Setting Pacing Amplitude: 2.7 V
Lead Channel Setting Pacing Pulse Width: 0.4 ms
Lead Channel Setting Pacing Pulse Width: 1.2 ms
Lead Channel Setting Sensing Sensitivity: 0.6 mV
Lead Channel Setting Sensing Sensitivity: 1 mV
Pulse Gen Serial Number: 370874

## 2020-11-19 SURGERY — CARDIOVERSION
Anesthesia: General

## 2020-11-19 MED ORDER — LIDOCAINE 2% (20 MG/ML) 5 ML SYRINGE
INTRAMUSCULAR | Status: DC | PRN
  Administered 2020-11-19: 60 mg via INTRAVENOUS

## 2020-11-19 MED ORDER — PROPOFOL 10 MG/ML IV BOLUS
INTRAVENOUS | Status: DC | PRN
  Administered 2020-11-19: 75 mg via INTRAVENOUS

## 2020-11-19 MED ORDER — SODIUM CHLORIDE 0.9 % IV SOLN: INTRAVENOUS | Status: DC

## 2020-11-19 NOTE — Transfer of Care (Signed)
Immediate Anesthesia Transfer of Care Note  Patient: [name]  Procedure(s) Performed: CARDIOVERSION ([name] )  Patient Location: PACU  Anesthesia Type:General  Level of Consciousness: drowsy  Airway & Oxygen Therapy: Patient Spontanous Breathing  Post-op Assessment: Report given to RN and Post -op Vital signs reviewed and stable  Post vital signs: Reviewed and stable  Last Vitals:  Vitals Value Taken Time  BP    Temp    Pulse    Resp    SpO2      Last Pain:  Vitals:   11/19/20 0947  TempSrc: Temporal  PainSc: 0-No pain         Complications: No complications documented.

## 2020-11-19 NOTE — Anesthesia Procedure Notes (Signed)
Procedure Name: General with mask airway Date/Time: 11/19/2020 10:45 AM Performed by: Imagene Riches, CRNA Pre-anesthesia Checklist: Patient identified, Emergency Drugs available, Suction available, Patient being monitored and Timeout performed Patient Re-evaluated:Patient Re-evaluated prior to induction Oxygen Delivery Method: Ambu bag Preoxygenation: Pre-oxygenation with 100% oxygen

## 2020-11-19 NOTE — Discharge Instructions (Signed)
Electrical Cardioversion Electrical cardioversion is the delivery of a jolt of electricity to restore a normal rhythm to the heart. A rhythm that is too fast or is not regular keeps the heart from pumping well. In this procedure, sticky patches or metal paddles are placed on the chest to deliver electricity to the heart from a device. This procedure may be done in an emergency if:  There is low or no blood pressure as a result of the heart rhythm.  Normal rhythm must be restored as fast as possible to protect the brain and heart from further damage.  It may save a life. This may also be a scheduled procedure for irregular or fast heart rhythms that are not immediately life-threatening. Tell a health care provider about:  Any allergies you have.  All medicines you are taking, including vitamins, herbs, eye drops, creams, and over-the-counter medicines.  Any problems you or family members have had with anesthetic medicines.  Any blood disorders you have.  Any surgeries you have had.  Any medical conditions you have.  Whether you are pregnant or may be pregnant. What are the risks? Generally, this is a safe procedure. However, problems may occur, including:  Allergic reactions to medicines.  A blood clot that breaks free and travels to other parts of your body.  The possible return of an abnormal heart rhythm within hours or days after the procedure.  Your heart stopping (cardiac arrest). This is rare. What happens before the procedure? Medicines  Your health care provider may have you start taking: ? Blood-thinning medicines (anticoagulants) so your blood does not clot as easily. ? Medicines to help stabilize your heart rate and rhythm.  Ask your health care provider about: ? Changing or stopping your regular medicines. This is especially important if you are taking diabetes medicines or blood thinners. ? Taking medicines such as aspirin and ibuprofen. These medicines can  thin your blood. Do not take these medicines unless your health care provider tells you to take them. ? Taking over-the-counter medicines, vitamins, herbs, and supplements. General instructions  Follow instructions from your health care provider about eating or drinking restrictions.  Plan to have someone take you home from the hospital or clinic.  If you will be going home right after the procedure, plan to have someone with you for 24 hours.  Ask your health care provider what steps will be taken to help prevent infection. These may include washing your skin with a germ-killing soap. What happens during the procedure?   An IV will be inserted into one of your veins.  Sticky patches (electrodes) or metal paddles may be placed on your chest.  You will be given a medicine to help you relax (sedative).  An electrical shock will be delivered. The procedure may vary among health care providers and hospitals. What can I expect after the procedure?  Your blood pressure, heart rate, breathing rate, and blood oxygen level will be monitored until you leave the hospital or clinic.  Your heart rhythm will be watched to make sure it does not change.  You may have some redness on the skin where the shocks were given. Follow these instructions at home:  Do not drive for 24 hours if you were given a sedative during your procedure.  Take over-the-counter and prescription medicines only as told by your health care provider.  Ask your health care provider how to check your pulse. Check it often.  Rest for 48 hours after the procedure or   as told by your health care provider.  Avoid or limit your caffeine use as told by your health care provider.  Keep all follow-up visits as told by your health care provider. This is important. Contact a health care provider if:  You feel like your heart is beating too quickly or your pulse is not regular.  You have a serious muscle cramp that does not go  away. Get help right away if:  You have discomfort in your chest.  You are dizzy or you feel faint.  You have trouble breathing or you are short of breath.  Your speech is slurred.  You have trouble moving an arm or leg on one side of your body.  Your fingers or toes turn cold or blue. Summary  Electrical cardioversion is the delivery of a jolt of electricity to restore a normal rhythm to the heart.  This procedure may be done right away in an emergency or may be a scheduled procedure if the condition is not an emergency.  Generally, this is a safe procedure.  After the procedure, check your pulse often as told by your health care provider. This information is not intended to replace advice given to you by your health care provider. Make sure you discuss any questions you have with your health care provider. Document Revised: 06/30/2019 Document Reviewed: 06/30/2019 Elsevier Patient Education  2020 [employer]  

## 2020-11-19 NOTE — Procedures (Signed)
Electrical Cardioversion Procedure Note [name] [id number] 1946-02-04  Procedure: Electrical Cardioversion Indications:  Atrial Fibrillation  Procedure Details Consent: Risks of procedure as well as the alternatives and risks of each were explained to the (patient/caregiver).  Consent for procedure obtained. Time Out: Verified patient identification, verified procedure, site/side was marked, verified correct patient position, special equipment/implants available, medications/allergies/relevent history reviewed, required imaging and test results available.  Performed  Patient placed on cardiac monitor, pulse oximetry, supplemental oxygen as necessary.  Sedation given: propofol per anesthesiology Pacer pads placed anterior and posterior chest.  Cardioverted 1 time(s).  Cardioverted at [place].  Evaluation Findings: Post procedure EKG shows: NSR Complications: None Patient did tolerate procedure well.   [name] 11/19/2020, 10:55 AM

## 2020-11-19 NOTE — Interval H&P Note (Signed)
History and Physical Interval Note:  11/19/2020 10:45 AM  [name]  has presented today for surgery, with the diagnosis of AFIB.  The various methods of treatment have been discussed with the patient and family. After consideration of risks, benefits and other options for treatment, the patient has consented to  Procedure(s): CARDIOVERSION ([name]) as a surgical intervention.  The patient's history has been reviewed, patient examined, no change in status, stable for surgery.  I have reviewed the patient's chart and labs.  Questions were answered to the patient's satisfaction.     Rayhaan Huster [employer]

## 2020-11-19 NOTE — Anesthesia Preprocedure Evaluation (Signed)
Anesthesia Evaluation  Patient identified by MRN, date of birth, ID band Patient awake    Reviewed: Allergy & Precautions, NPO status , Patient's Chart, lab work & pertinent test results  Airway Mallampati: III  TM Distance: >3 FB Neck ROM: Full    Dental  (+) Poor Dentition, Chipped,    Pulmonary shortness of breath and with exertion, sleep apnea and Continuous Positive Airway Pressure Ventilation , COPD (3 L Sebastian prn),  COPD inhaler and oxygen dependent, former smoker,    Pulmonary exam normal breath sounds clear to auscultation       Cardiovascular hypertension, Pt. on home beta blockers and Pt. on medications + angina + CAD, + Past MI, + Cardiac Stents, + CABG, + Peripheral Vascular Disease and +CHF  + dysrhythmias Atrial Fibrillation + Cardiac Defibrillator  Rhythm:Irregular Rate:Tachycardia  BiVICD   Neuro/Psych negative neurological ROS  negative psych ROS   GI/Hepatic Neg liver ROS, hiatal hernia,   Endo/Other  diabetesHypothyroidism   Renal/GU negative Renal ROS     Musculoskeletal negative musculoskeletal ROS (+)   Abdominal   Peds  Hematology negative hematology ROS (+)   Anesthesia Other Findings A-fib with RVR  Reproductive/Obstetrics                             Anesthesia Physical Anesthesia Plan  ASA: IV  Anesthesia Plan: General   Post-op Pain Management:    Induction: Intravenous  PONV Risk Score and Plan: 2 and Propofol infusion and Treatment may vary due to age or medical condition  Airway Management Planned: Mask  Additional Equipment:   Intra-op Plan:   Post-operative Plan:   Informed Consent: I have reviewed the patients History and Physical, chart, labs and discussed the procedure including the risks, benefits and alternatives for the proposed anesthesia with the patient or authorized representative who has indicated his/her understanding and acceptance.      Dental advisory given  Plan Discussed with: CRNA  Anesthesia Plan Comments:         Anesthesia Quick Evaluation

## 2020-11-20 NOTE — Anesthesia Postprocedure Evaluation (Signed)
Anesthesia Post Note  Patient: [name]  Procedure(s) Performed: CARDIOVERSION ([name] )     Patient location during evaluation: Endoscopy Anesthesia Type: General Level of consciousness: awake Pain management: pain level controlled Vital Signs Assessment: post-procedure vital signs reviewed and stable Respiratory status: spontaneous breathing, nonlabored ventilation, respiratory function stable and patient connected to nasal cannula oxygen Cardiovascular status: blood pressure returned to baseline and stable Postop Assessment: no apparent nausea or vomiting Anesthetic complications: no   No complications documented.  Last Vitals:  Vitals:   11/19/20 1106 11/19/20 1115  BP: 122/65 118/60  Pulse: 71 72  Resp: 18 18  Temp:    SpO2: 90% 92%    Last Pain:  Vitals:   11/19/20 1115  TempSrc:   PainSc: 0-No pain                 Dashanique Brownstein P Taris Galindo

## 2020-11-21 ENCOUNTER — Encounter (HOSPITAL_COMMUNITY): Payer: Self-pay | Admitting: Cardiology

## 2020-11-22 LAB — POCT I-STAT, CHEM 8
BUN: 45 mg/dL — ABNORMAL HIGH (ref 8–23)
Calcium, Ion: 1.19 mmol/L (ref 1.15–1.40)
Chloride: 103 mmol/L (ref 98–111)
Creatinine, Ser: 1.3 mg/dL — ABNORMAL HIGH (ref 0.61–1.24)
Glucose, Bld: 144 mg/dL — ABNORMAL HIGH (ref 70–99)
HCT: 46 % (ref 39.0–52.0)
Hemoglobin: 15.6 g/dL (ref 13.0–17.0)
Potassium: 4 mmol/L (ref 3.5–5.1)
Sodium: 141 mmol/L (ref 135–145)
TCO2: 26 mmol/L (ref 22–32)

## 2020-11-29 ENCOUNTER — Encounter: Payer: Self-pay | Admitting: Internal Medicine

## 2020-11-29 NOTE — Progress Notes (Signed)
HPI 74 year old male former smoker followed for dyspnea/COPD, lung cancer/ XRT, OSA complicated by CAD/CHF/MI/AICD/CM/AAA, AICD, morbid obesity DM 2 HBP, GERD, tremor/ deep brainstimulator NPSG 11/05/07- [place]- AHI 14.6/ hr, desaturation to 84%, body weight 279 lbs PET scan 10/25/2018-  progressive increased soft tissue within the right upper lobe suspicious for recurrent tumor. -------------------------------------------------------------------  05/31/20- 74 year old male former smoker followed for dyspnea/COPD, lung nodules/ XRT, OSA complicated by CAD/CHF/MI/AICD/CM/AAA, morbid obesity,  DM 2 HBP, GERD, tremor/ deep brain stimulator CPAP 12 / [employer]/ AHP  O2 2- 3 L. -----OSA on CPAP Body weight 269 lbs Neb albuterol, albuterol hfa, Trelegy 100,  Continues xarelto and amiodarone [facility] reports he is compliant with CPAP and he says he sleeps better with it and oxygen, but no download available at this time. Covid vax- 2 [employer] Prolonged sinus infection and bronchitis respsonded finally to antibiotics with tessalon. CT chest 12/19/19- IMPRESSION: 1. Marked interval decrease in size of the left upper lobe pulmonary nodule seen to be hypermetabolic on PET-CT of 71/05/2693. 2. Interval evolution of post treatment changes in the right lung consistent with prior radiation therapy. 3.  Emphysema. (ICD10-J43.9) 4.  Aortic Atherosclerois (ICD10-170.0) CXR 03/23/20- IMPRESSION: 1.  No active cardiopulmonary disease. 2. Post radiation changes in both upper lobes.  11/30/20- 74 year old male former smoker followed for dyspnea/COPD, lung nodules/ Presumed CA/ XRT, OSA complicated by CAD/CHF/MI/AICD/CM/AAA, morbid obesity,  DM 2 HBP, GERD, tremor/ deep brain stimulator - albuterol hfa, Atrovent hfa,  Neb albuterol, Trelegy 100,  CPAP auto 10-20 / [facility]/ AHP  O2 2- 3 L. Download- compliance since 12/6 = 100%, AHI 1.1/ hr      [facility] record begins 12/6. Body weight today- 274 lbs Covid vax-3  Phizer Flu vax- had Cardioversion 12/10 Using O2 at night and as needed in day. Feeling pretty well recently. Had more XRT to L lung this summer.  O2 sat today on room air on arrival 97%.  Very compliant with CPAP- can't breathe lying down without it.  CT chest (Dr Kinard)-06/19/20-  IMPRESSION: Stable post radiation changes in right lung. New area of airspace disease in posterior left upper lobe which obscures previously seen pulmonary nodule at this site, consistent with radiation pneumonitis. No evidence of lymphadenopathy or pleural effusion. Stable mild diffuse esophageal wall thickening, consistent with esophagitis. Aortic Atherosclerosis (ICD10-I70.0) and Emphysema (ICD10-J43.9).  ROS-see HPI    + = positive Constitutional:  +  weight loss, night sweats, fevers, chills, fatigue, lassitude. HEENT:   No-  headaches, difficulty swallowing, tooth/dental problems, sore throat,       No-  sneezing, itching, ear ache, nasal congestion, post nasal drip,  CV:  No-   chest pain, +orthopnea, PND, +swelling in lower extremities, No-anasarca, dizziness, palpitations Resp: + shortness of breath with exertion or at rest.              No-   productive cough,  No non-productive cough,  No- coughing up of blood.              No-   change in color of mucus.  No- wheezing.   Skin: No-   rash or lesions. GI:  No-   heartburn, indigestion, abdominal pain, nausea, vomiting, GU: . MS:  + joint pain or swelling.  Neuro-     nothing unusual Psych:  No- change in mood or affect. No depression or anxiety.  No memory loss.  OBJ- Physical Exam   97% on O2 3L. +Wheelchair  General- Alert, Oriented, Affect-appropriate, Distress-  none acute, + morbidly obese  Skin-+ ecchymoses on arms. Lymphadenopathy- none Head- atraumatic            Eyes- Gross vision intact, PERRLA, conjunctivae and secretions clear            Ears- Hearing, canals-normal            Nose- clear, no-Septal dev, mucus, polyps,  erosion, perforation             Throat- Mallampati III, mucosa clear , drainage- none, tonsils- atrophic Neck- flexible , trachea midline, no stridor , thyroid nl, carotid no bruit Chest - symmetrical excursion , unlabored           Heart/CV- RRR , no murmur , no gallop  , no rub, nl s1 s2                           - JVD- none , edema -none, stasis changes- none, varices- none           Lung-+unlabored shallow inspiratory effort, rales- none, wheeze- none, cough- none ,                          dullness-none, rub- none,            Chest wall- sternotomy scar,  Pacemaker defibrillator L Abd-  Br/ Gen/ Rectal- Not done, not indicated Extrem- cyanosis- none, clubbing, none, atrophy- none, strength- nl, vein donor scars Neuro- +head bob tremor

## 2020-11-30 ENCOUNTER — Ambulatory Visit (INDEPENDENT_AMBULATORY_CARE_PROVIDER_SITE_OTHER): Payer: Medicare Other | Admitting: Internal Medicine

## 2020-11-30 ENCOUNTER — Encounter: Payer: Self-pay | Admitting: Internal Medicine

## 2020-11-30 ENCOUNTER — Other Ambulatory Visit: Payer: Self-pay

## 2020-11-30 DIAGNOSIS — I255 Ischemic cardiomyopathy: Secondary | ICD-10-CM | POA: Diagnosis not present

## 2020-11-30 DIAGNOSIS — J449 Chronic obstructive pulmonary disease, unspecified: Secondary | ICD-10-CM

## 2020-11-30 DIAGNOSIS — G4733 Obstructive sleep apnea (adult) (pediatric): Secondary | ICD-10-CM | POA: Diagnosis not present

## 2020-11-30 DIAGNOSIS — Z9989 Dependence on other enabling machines and devices: Secondary | ICD-10-CM | POA: Diagnosis not present

## 2020-11-30 NOTE — Patient Instructions (Addendum)
We can continue CPAP auto 10-20 and oxygen 2-3 liters/ minute  We can current meds  Please call if we can help

## 2020-12-01 ENCOUNTER — Encounter: Payer: Self-pay | Admitting: Internal Medicine

## 2020-12-01 NOTE — Assessment & Plan Note (Signed)
COPD is stable, without recent exacerbation, but complicated by XRT and obesity. Trelegy helps. Plan- continue current meds.

## 2020-12-01 NOTE — Assessment & Plan Note (Signed)
Benefits from CPAP/ O2 with good compliance and control.No concerns. Plan- continue auto 10-20 with O2 2-3L

## 2020-12-02 NOTE — Progress Notes (Signed)
Remote ICD transmission.   

## 2020-12-07 ENCOUNTER — Other Ambulatory Visit (HOSPITAL_COMMUNITY): Payer: Self-pay

## 2020-12-07 MED ORDER — ATORVASTATIN CALCIUM 80 MG PO TABS
80.0000 mg | ORAL_TABLET | Freq: Every day | ORAL | 1 refills | Status: DC
Start: 2020-12-07 — End: 2020-12-20

## 2020-12-16 ENCOUNTER — Other Ambulatory Visit (HOSPITAL_COMMUNITY): Payer: Self-pay | Admitting: Cardiology

## 2020-12-18 ENCOUNTER — Other Ambulatory Visit (HOSPITAL_COMMUNITY): Payer: Self-pay | Admitting: Cardiology

## 2020-12-22 ENCOUNTER — Other Ambulatory Visit: Payer: Self-pay

## 2020-12-22 ENCOUNTER — Ambulatory Visit (HOSPITAL_COMMUNITY)
Admission: RE | Admit: 2020-12-22 | Discharge: 2020-12-22 | Disposition: A | Discharge status: Home / Self Care | Payer: Medicare Other | Source: Ambulatory Visit | Attending: Radiation Oncology | Admitting: Radiation Oncology

## 2020-12-22 ENCOUNTER — Encounter (HOSPITAL_COMMUNITY): Payer: Self-pay

## 2020-12-22 DIAGNOSIS — C3490 Malignant neoplasm of unspecified part of unspecified bronchus or lung: Secondary | ICD-10-CM | POA: Diagnosis not present

## 2020-12-22 DIAGNOSIS — R911 Solitary pulmonary nodule: Secondary | ICD-10-CM | POA: Insufficient documentation

## 2020-12-22 NOTE — Progress Notes (Signed)
Radiation Oncology         [phone] ________________________________  Name: [name] MRN: [id number]  Date: 12/23/2020  DOB: 03-Mar-1946  Follow-Up Visit Note  CC: [name], MD  [name], *    ICD-10-CM   1. Solitary pulmonary nodule  R91.1 CT Chest Wo Contrast    Diagnosis: PET positive pulmonary nodule in the left upper lobe  Interval Since Last Radiation: One year, four months, and three days  08/14/2019 through 08/21/2019 Site Technique Total Dose Dose per Fx Completed Fx Beam Energies  [place]: Lung_Lt SBRT 54/54 18 3/3 6XFFF    12/09/2018 - 01/20/2019: Right lung (retx) / 60 Gy in 30 fractions (IMRT)  2/25, 2/27, 02/08/18: Right lower lung / 54 Gy in 3 fractions(SBRT)  4/30, 5/3, 04/17/17: Rightupper lung/ 54 Gy in 3 fractions (SBRT)  Narrative:  The patient returns today for routine follow-up. He is doing well overall.  Since his last visit, he underwent a chest CT scan on 12/22/2020 that showed bilateral upper lobe radiation change without findings of recurrent or metastatic disease. There was a subtle reticulonodular opacity in the superior segment of the right lower lobe that was felt to be slightly progressive when compared to 06/18/2020 and was favored to represent a focus of infection or inflammation.  Of note, the patient is being still being followed by cardiology for history of heart failure. Additionally, he underwent an electrical cardioversion on 11/29/2020 for atrial fibrillation.   On review of systems, he reports fatigue and dyspnea which limits his activity. He denies chest pain, significant cough or hemoptysis.Marland Kitchen  ALLERGIES:  is allergic to tussionex pennkinetic er [hydrocod polst-cpm polst er] and ace inhibitors.  Meds: Current Outpatient Medications  Medication Sig Dispense Refill  . albuterol (PROVENTIL HFA;VENTOLIN HFA) 108 (90 BASE) MCG/ACT inhaler Inhale 2 puffs into the lungs every 6 (six) hours as needed for wheezing or  shortness of breath. 3 Inhaler 4  . albuterol (PROVENTIL) (2.5 MG/3ML) 0.083% nebulizer solution USE 1 VIAL IN NEBULIZER 4 TIMES DAILY (Patient taking differently: Take 2.5 mg by nebulization every 6 (six) hours as needed for wheezing or shortness of breath.) 75 mL 11  . amiodarone (PACERONE) 200 MG tablet Take 1 tablet (200 mg total) by mouth daily. 45 tablet 3  . atorvastatin (LIPITOR) 80 MG tablet TAKE 1 TABLET BY MOUTH  DAILY 90 tablet 3  . ATROVENT HFA 17 MCG/ACT inhaler Inhale 2 puffs into the lungs every 4 (four) hours as needed for wheezing.   12  . carvedilol (COREG) 25 MG tablet TAKE 1 TABLET BY MOUTH  TWICE DAILY WITH A MEAL (Patient taking differently: Take 25 mg by mouth 2 (two) times daily with a meal.) 180 tablet 3  . dapagliflozin propanediol (FARXIGA) 10 MG TABS tablet Take 1 tablet (10 mg total) by mouth daily before breakfast. 30 tablet 11  . ezetimibe (ZETIA) 10 MG tablet Take 1 tablet (10 mg total) by mouth daily. 90 tablet 1  . finasteride (PROSCAR) 5 MG tablet Take 5 mg by mouth daily.    . fluticasone (FLONASE) 50 MCG/ACT nasal spray Instill 1 spray in each  nostril daily (Patient taking differently: Place 1 spray into both nostrils daily.) 32 g 3  . Fluticasone-Umeclidin-Vilant (TRELEGY ELLIPTA) 100-62.5-25 MCG/INH AEPB Inhale 1 puff into the lungs daily. Rinse mouth 60 each 12  . isosorbide mononitrate (IMDUR) 60 MG 24 hr tablet TAKE 1 TABLET BY MOUTH  DAILY (Patient taking differently: Take 60 mg  by mouth daily.) 90 tablet 3  . levothyroxine (SYNTHROID) 50 MCG tablet TAKE 1 TABLET BY MOUTH  DAILY BEFORE BREAKFAST (Patient taking differently: Take 50 mcg by mouth daily before breakfast.) 90 tablet 1  . Multiple Vitamins-Minerals (MULTIVITAMIN WITH MINERALS) tablet Take 1 tablet by mouth daily.    . Multiple Vitamins-Minerals (PRESERVISION AREDS 2 PO) Take 1 capsule by mouth at bedtime.    . nitroGLYCERIN (NITROSTAT) 0.4 MG SL tablet PLACE 1 TABLET (0.4 MG TOTAL) UNDER THE  TONGUE EVERY 5 (FIVE) MINUTES AS NEEDED FOR CHEST PAIN. 75 tablet 1  . NON FORMULARY at bedtime. CPAP And during the day as needed    . Omega-3 Fatty Acids (FISH OIL) 1000 MG CAPS Take 1,000 mg by mouth daily.    . potassium chloride SA (KLOR-CON M20) 20 MEQ tablet Take 1 tablet (20 mEq total) by mouth daily. 90 tablet 1  . Rivaroxaban (XARELTO) 15 MG TABS tablet Take 1 tablet (15 mg total) by mouth daily with supper. 30 tablet 11  . sacubitril-valsartan (ENTRESTO) 24-26 MG Take 1 tablet by mouth 2 (two) times daily. 60 tablet 11  . torsemide (DEMADEX) 20 MG tablet Take 2 tablets (40 mg total) by mouth 2 (two) times daily. 120 tablet 11  . traMADol (ULTRAM) 50 MG tablet Take 1 tablet (50 mg total) by mouth every 6 (six) hours as needed. (Patient taking differently: Take 50 mg by mouth every 6 (six) hours as needed for severe pain.) 75 tablet 3  . VASCEPA 1 g capsule TAKE 2 CAPSULES (2 G TOTAL) BY MOUTH 2 (TWO) TIMES A DAY. (Patient taking differently: Take 2 g by mouth 2 (two) times daily.) 120 capsule 11  . diazepam (VALIUM) 5 MG tablet Take 1 tablet (5 mg total) by mouth every 8 (eight) hours as needed for anxiety. 1-2 tabs every 8 hours if needed for tremor (Patient not taking: Reported on 12/23/2020) 30 tablet 5   No current facility-administered medications for this encounter.    Physical Findings: The patient is in no acute distress. Patient is alert and oriented.  temperature is 98.1 F (36.7 C). His blood pressure is 126/47 (abnormal) and his pulse is 70. His respiration is 24 (abnormal) and oxygen saturation is 97%.  Lungs are clear to auscultation bilaterally. Heart has regular rate and rhythm.  He appears to be in normal sinus rhythm at this time. no palpable cervical, supraclavicular, or axillary adenopathy. Abdomen soft, non-tender, normal bowel sounds. No wheezing.    Lab Findings: Lab Results  Component Value Date   WBC 7.1 07/14/2020   HGB 15.6 11/19/2020   HCT 46.0  11/19/2020   MCV 95.4 07/14/2020   PLT 156 07/14/2020    Radiographic Findings: CT Chest Wo Contrast  Result Date: 12/22/2020 CLINICAL DATA:  Non-small-cell lung cancer. Evaluate treatment response. Status post radiation therapy. EXAM: CT CHEST WITHOUT CONTRAST TECHNIQUE: Multidetector CT imaging of the chest was performed following the standard protocol without IV contrast. COMPARISON:  06/18/2020 FINDINGS: Cardiovascular: Pacer/AICD device. Advanced aortic and branch vessel atherosclerosis. Mild cardiomegaly, without pericardial effusion. Multivessel coronary artery atherosclerosis. Prior median sternotomy for CABG. Mediastinum/Nodes: No mediastinal or definite hilar adenopathy, given limitations of unenhanced CT. Mildly dilated, fluid-filled esophagus. Lungs/Pleura: No pleural fluid. Moderate centrilobular emphysema. Bilateral upper lobe areas of radiation induced consolidation are again identified. No findings of locally recurrent disease Right lower lobe tiny calcified granulomas. Right lower and middle lobe scarring. Subtle reticulonodular opacity in the superior segment right lower lobe, including  on 46/5, is similar to slightly increased. Upper Abdomen: Normal imaged portions of the liver, spleen, stomach, pancreas, adrenal glands, kidneys. Abdominal aortic stent graft is incompletely imaged. Musculoskeletal: Osteopenia. IMPRESSION: 1. Bilateral upper lobe radiation change, without findings of recurrent or metastatic disease. 2. Subtle reticulonodular opacity in the superior segment right lower lobe, felt to be slightly progressive compared to 06/18/2020. Favored to represent a focus of infection or inflammation. Recommend attention on follow-up. 3. Dilated, fluid-filled esophagus, suggesting dysmotility and/or gastroesophageal reflux. 4. Aortic atherosclerosis (ICD10-I70.0) and emphysema (ICD10-J43.9). Electronically Signed   By: Abigail Miyamoto M.D.   On: 12/22/2020 14:46    Impression:  PET  positive pulmonary nodule in the left upper lobe  Recent chest CT scan is stable.  No new areas. No clinical evidence of recurrence on exam today.   Plan: Routine follow-up in six months with repeat CT scan of chest prior to that visit.  He will follow-up with [name] tomorrow.  Total time spent in this encounter was 20 minutes which included reviewing the patient's most recent chest CT scan, follow-ups, cardioversion, physical examination, and documentation. ____________________________________   Blair Promise, PhD, MD  This document serves as a record of services personally performed by Gery Pray, MD. It was created on his behalf by Clerance Lav, a trained medical scribe. The creation of this record is based on the scribe's personal observations and the provider's statements to them. This document has been checked and approved by the attending provider.

## 2020-12-23 ENCOUNTER — Other Ambulatory Visit: Payer: Self-pay

## 2020-12-23 ENCOUNTER — Encounter: Payer: Self-pay | Admitting: Radiation Oncology

## 2020-12-23 ENCOUNTER — Ambulatory Visit
Admission: RE | Admit: 2020-12-23 | Discharge: 2020-12-23 | Disposition: A | Discharge status: Home / Self Care | Payer: Medicare Other | Source: Ambulatory Visit | Attending: Radiation Oncology | Admitting: Radiation Oncology

## 2020-12-23 VITALS — BP 126/47 | HR 70 | Temp 98.1°F | Resp 24

## 2020-12-23 DIAGNOSIS — Z7984 Long term (current) use of oral hypoglycemic drugs: Secondary | ICD-10-CM | POA: Diagnosis not present

## 2020-12-23 DIAGNOSIS — Z7901 Long term (current) use of anticoagulants: Secondary | ICD-10-CM | POA: Insufficient documentation

## 2020-12-23 DIAGNOSIS — M8580 Other specified disorders of bone density and structure, unspecified site: Secondary | ICD-10-CM | POA: Diagnosis not present

## 2020-12-23 DIAGNOSIS — I4891 Unspecified atrial fibrillation: Secondary | ICD-10-CM | POA: Insufficient documentation

## 2020-12-23 DIAGNOSIS — R911 Solitary pulmonary nodule: Secondary | ICD-10-CM | POA: Diagnosis not present

## 2020-12-23 DIAGNOSIS — Z79899 Other long term (current) drug therapy: Secondary | ICD-10-CM | POA: Diagnosis not present

## 2020-12-23 DIAGNOSIS — R5383 Other fatigue: Secondary | ICD-10-CM | POA: Diagnosis not present

## 2020-12-23 DIAGNOSIS — I700 Atherosclerosis of aorta: Secondary | ICD-10-CM | POA: Diagnosis not present

## 2020-12-23 DIAGNOSIS — Z08 Encounter for follow-up examination after completed treatment for malignant neoplasm: Secondary | ICD-10-CM | POA: Diagnosis not present

## 2020-12-23 DIAGNOSIS — I2510 Atherosclerotic heart disease of native coronary artery without angina pectoris: Secondary | ICD-10-CM | POA: Insufficient documentation

## 2020-12-23 DIAGNOSIS — C3481 Malignant neoplasm of overlapping sites of right bronchus and lung: Secondary | ICD-10-CM | POA: Diagnosis not present

## 2020-12-23 NOTE — Progress Notes (Signed)
Patient is here for follow up to SBRT radiation from 2018/2019/2020.  He denies having any pain today but does report fatigue that limits activities.  Patient denies any issues with appetite.  Shortness of breath is sparodic and worse with mask wearing.  Patient denies coughing. He reports limitations in ambulation and has to walk with assistance from walls and furniture.  Denies nausea or vomiting.  Reports having hernia and sometimes feels like food gets lodged.    Vitals:   12/23/20 1042  BP: (!) 126/47  Pulse: 70  Resp: (!) 24  Temp: 98.1 F (36.7 C)  SpO2: 97%

## 2020-12-23 NOTE — Addendum Note (Signed)
Encounter addended by: Wilmon Arms, RN on: 12/23/2020 11:49 AM  Actions taken: Charge Capture section accepted

## 2020-12-24 ENCOUNTER — Encounter (HOSPITAL_COMMUNITY): Payer: Self-pay | Admitting: Cardiology

## 2020-12-24 ENCOUNTER — Ambulatory Visit (HOSPITAL_COMMUNITY)
Admission: RE | Admit: 2020-12-24 | Discharge: 2020-12-24 | Disposition: A | Discharge status: Home / Self Care | Payer: Medicare Other | Source: Ambulatory Visit | Attending: Cardiology | Admitting: Cardiology

## 2020-12-24 ENCOUNTER — Ambulatory Visit (HOSPITAL_BASED_OUTPATIENT_CLINIC_OR_DEPARTMENT_OTHER)
Admission: RE | Admit: 2020-12-24 | Discharge: 2020-12-24 | Disposition: A | Discharge status: Home / Self Care | Payer: Medicare Other | Source: Ambulatory Visit | Attending: Internal Medicine | Admitting: Internal Medicine

## 2020-12-24 VITALS — BP 128/70 | HR 70 | Wt 275.6 lb

## 2020-12-24 DIAGNOSIS — I480 Paroxysmal atrial fibrillation: Secondary | ICD-10-CM | POA: Diagnosis not present

## 2020-12-24 DIAGNOSIS — I493 Ventricular premature depolarization: Secondary | ICD-10-CM | POA: Insufficient documentation

## 2020-12-24 DIAGNOSIS — K219 Gastro-esophageal reflux disease without esophagitis: Secondary | ICD-10-CM | POA: Insufficient documentation

## 2020-12-24 DIAGNOSIS — I5022 Chronic systolic (congestive) heart failure: Secondary | ICD-10-CM | POA: Insufficient documentation

## 2020-12-24 DIAGNOSIS — Z95810 Presence of automatic (implantable) cardiac defibrillator: Secondary | ICD-10-CM | POA: Insufficient documentation

## 2020-12-24 DIAGNOSIS — Z951 Presence of aortocoronary bypass graft: Secondary | ICD-10-CM | POA: Insufficient documentation

## 2020-12-24 DIAGNOSIS — G8929 Other chronic pain: Secondary | ICD-10-CM | POA: Diagnosis not present

## 2020-12-24 DIAGNOSIS — Z6841 Body Mass Index (BMI) 40.0 and over, adult: Secondary | ICD-10-CM | POA: Insufficient documentation

## 2020-12-24 DIAGNOSIS — Z7901 Long term (current) use of anticoagulants: Secondary | ICD-10-CM | POA: Insufficient documentation

## 2020-12-24 DIAGNOSIS — I255 Ischemic cardiomyopathy: Secondary | ICD-10-CM | POA: Insufficient documentation

## 2020-12-24 DIAGNOSIS — E785 Hyperlipidemia, unspecified: Secondary | ICD-10-CM | POA: Insufficient documentation

## 2020-12-24 DIAGNOSIS — J449 Chronic obstructive pulmonary disease, unspecified: Secondary | ICD-10-CM | POA: Insufficient documentation

## 2020-12-24 DIAGNOSIS — D510 Vitamin B12 deficiency anemia due to intrinsic factor deficiency: Secondary | ICD-10-CM

## 2020-12-24 DIAGNOSIS — I110 Hypertensive heart disease with heart failure: Secondary | ICD-10-CM | POA: Insufficient documentation

## 2020-12-24 DIAGNOSIS — I2510 Atherosclerotic heart disease of native coronary artery without angina pectoris: Secondary | ICD-10-CM | POA: Insufficient documentation

## 2020-12-24 DIAGNOSIS — E1151 Type 2 diabetes mellitus with diabetic peripheral angiopathy without gangrene: Secondary | ICD-10-CM | POA: Insufficient documentation

## 2020-12-24 DIAGNOSIS — I5042 Chronic combined systolic (congestive) and diastolic (congestive) heart failure: Secondary | ICD-10-CM | POA: Diagnosis not present

## 2020-12-24 DIAGNOSIS — I714 Abdominal aortic aneurysm, without rupture: Secondary | ICD-10-CM | POA: Diagnosis not present

## 2020-12-24 DIAGNOSIS — E039 Hypothyroidism, unspecified: Secondary | ICD-10-CM | POA: Diagnosis not present

## 2020-12-24 DIAGNOSIS — M545 Low back pain, unspecified: Secondary | ICD-10-CM | POA: Diagnosis not present

## 2020-12-24 DIAGNOSIS — I4819 Other persistent atrial fibrillation: Secondary | ICD-10-CM | POA: Insufficient documentation

## 2020-12-24 DIAGNOSIS — Z87891 Personal history of nicotine dependence: Secondary | ICD-10-CM | POA: Insufficient documentation

## 2020-12-24 DIAGNOSIS — I252 Old myocardial infarction: Secondary | ICD-10-CM | POA: Diagnosis not present

## 2020-12-24 LAB — TSH: TSH: 3.971 u[IU]/mL (ref 0.350–4.500)

## 2020-12-24 LAB — COMPREHENSIVE METABOLIC PANEL
ALT: 19 U/L (ref 0–44)
AST: 18 U/L (ref 15–41)
Albumin: 3.9 g/dL (ref 3.5–5.0)
Alkaline Phosphatase: 76 U/L (ref 38–126)
Anion gap: 10 (ref 5–15)
BUN: 30 mg/dL — ABNORMAL HIGH (ref 8–23)
CO2: 29 mmol/L (ref 22–32)
Calcium: 8.9 mg/dL (ref 8.9–10.3)
Chloride: 102 mmol/L (ref 98–111)
Creatinine, Ser: 1.45 mg/dL — ABNORMAL HIGH (ref 0.61–1.24)
GFR, Estimated: 50 mL/min — ABNORMAL LOW (ref >60)
Glucose, Bld: 112 mg/dL — ABNORMAL HIGH (ref 70–99)
Potassium: 4.1 mmol/L (ref 3.5–5.1)
Sodium: 141 mmol/L (ref 135–145)
Total Bilirubin: 0.8 mg/dL (ref 0.3–1.2)
Total Protein: 6.5 g/dL (ref 6.5–8.1)

## 2020-12-24 LAB — CBC
HCT: 44.4 % (ref 39.0–52.0)
Hemoglobin: 14.3 g/dL (ref 13.0–17.0)
MCH: 30.2 pg (ref 26.0–34.0)
MCHC: 32.2 g/dL (ref 30.0–36.0)
MCV: 93.7 fL (ref 80.0–100.0)
Platelets: 152 10*3/uL (ref 150–400)
RBC: 4.74 MIL/uL (ref 4.22–5.81)
RDW: 15 % (ref 11.5–15.5)
WBC: 6.9 10*3/uL (ref 4.0–10.5)
nRBC: 0 % (ref 0.0–0.2)

## 2020-12-24 LAB — ECHOCARDIOGRAM COMPLETE
Area-P 1/2: 3.37 cm2
Calc EF: 42 %
MV M vel: 3.47 m/s
MV Peak grad: 48.2 mmHg
S' Lateral: 6 cm
Single Plane A2C EF: 44 %
Single Plane A4C EF: 35.8 %

## 2020-12-24 MED ORDER — POTASSIUM CHLORIDE CRYS ER 20 MEQ PO TBCR: 20.0000 meq | EXTENDED_RELEASE_TABLET | Freq: Every day | ORAL | 3 refills | Status: DC

## 2020-12-24 MED ORDER — ENTRESTO 49-51 MG PO TABS: 1.0000 | ORAL_TABLET | Freq: Two times a day (BID) | ORAL | 3 refills | Status: DC

## 2020-12-24 NOTE — Progress Notes (Signed)
  Echocardiogram 2D Echocardiogram has been performed.  Michiel Cowboy 12/24/2020, 2:39 PM

## 2020-12-24 NOTE — Progress Notes (Signed)
Patient is being seen in the [facility]. VS, EKG, and device interrogations performed in the clinic. Dr McLean is at home and seeing patients via telemedicine as he is in quarantine.   

## 2020-12-24 NOTE — Patient Instructions (Addendum)
Labs done today. We will contact you only if your labs are abnormal.  INCREASE Entresto 49-51mg  (1 tablet) by mouth 2 times daily.   Your Potassium was refilled.   No other medication changes were made. Please continue all current medications as prescribed.  Your physician recommends that you schedule a follow-up appointment in: 10 days for a lab only appointment and in 6 weeks.    If you have any questions or concerns before your next appointment please send [place] a message through [place] or call our office at [phone].    TO LEAVE A MESSAGE FOR THE NURSE SELECT OPTION 2, PLEASE LEAVE A MESSAGE INCLUDING: . YOUR NAME . DATE OF BIRTH . CALL BACK NUMBER . REASON FOR CALL**this is important as we prioritize the call backs  YOU WILL RECEIVE A CALL BACK THE SAME DAY AS LONG AS YOU CALL BEFORE 4:00 PM   Do the following things EVERYDAY: 1) Weigh yourself in the morning before breakfast. Write it down and keep it in a log. 2) Take your medicines as prescribed 3) Eat low salt foods-Limit salt (sodium) to 2000 mg per day.  4) Stay as active as you can everyday 5) Limit all fluids for the day to less than 2 liters   At the [facility], you and your health needs are our priority. As part of our continuing mission to provide you with exceptional heart care, we have created designated Provider Care Teams. These Care Teams include your primary Cardiologist (physician) and Advanced Practice Providers (APPs- Physician Assistants and Nurse Practitioners) who all work together to provide you with the care you need, when you need it.   You may see any of the following providers on your designated Care Team at your next follow up: Marland Kitchen Dr [name] . Dr [name] . Darrick Grinder, NP . Lyda Jester, PA . Audry Riles, PharmD   Please be sure to bring in all your medications bottles to every appointment.

## 2020-12-26 NOTE — Progress Notes (Signed)
Heart Failure TeleHealth Note  Due to national recommendations of social distancing due to [place] 19, Audio/video telehealth visit is felt to be most appropriate for this patient at this time.  See MyChart message from today for patient consent regarding telehealth for [name].  Date:  12/26/2020   ID:  [name], DOB 1946/04/20, MRN [id number]  Location: Home  Provider location: [place] [facility] Type of Visit: Established patient   PCP:  [name], MD  Cardiologist:  [name]   History of Present Illness: [name] is a 75 y.o. male who presents via audio/video conferencing for a telehealth visit today.     he denies symptoms worrisome for COVID 19.   Patient has a past medical history significant for morbid obesity, systolic heart failure with an EF of 30-35% (January 2014) -> 20-25% (March 2015 - RV normal), VT on amio, CAD s/p CABG 1996, LBBB, COPD, obstructive sleep apnea on CPAP, AAA repair (January 2015).   Admitted to [facility] July 24 through July 06 2014 with chest pain.  CEs negative. Had a LHC with no change from previous LHC with recommendations to continue medical management. Discharge weight was 255 pounds.   LHC 07/06/14 --No significant change from previous studies.  Left anterior descending (LAD): The LAD is a large vessel. There is a 40% stenosis immediately after the takeoff of a large septal perforator. There are 2 large diagonal branches without significant disease. Left circumflex (LCx): The LCx is occluded proximally. There are left to left collaterals.  Right coronary artery (RCA): The RCA is occluded proximally immediately following the conus branch. There are right to right and left to right collaterals. SVGs from CABG known to be totally occluded.   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   CTA chest/abd/pelvis (3/16) with moderate emphysema, 1.3 cm nodule RUL, left SFA totally occluded, right SFA with  significant stenosis, s/p endovascular AAA repair (stable).   Echo (5/17) with EF 20-25%, severe LV dilation, mild MR, normal RV size with mildly decreased systolic function.   He has a RUL nodule that is most likely lung cancer, he is being treated with radiation.   Echo (8/18) with EF 30%, basal-mid inferior and inferolateral AK, severe LV dilation, normal RV, mild MR.   RHC/LHC was done in 1/19 due to worsening exertional dyspnea.  This showed normal filling pressures and preserved cardiac output.  There was 30-40% mLAD stenosis.  The LAD provided collaterals to the LCx and RCA territories.  The LCx, RCA, SVG-OM, and SVG-PDA were all chronically occluded (known from the past).  No new disease.   [facility] in [facility] at [facility] with 0.78 on right, 0.59 on left => left iliac > 50% stenosis.  11/19 ABIs 0.78 right, 0.59 left.  3/20 ABIs 0.74 right, 0.66 left.   He saw neurology about his tremor.  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  However, he is on amiodarone which may be contributing.  He has been seeing Dr. Vertell Limber with plan for deep brain stimulator, on hold currently due to cancer treatment.   He had chest radiation for RLL lung cancer.  However, CT showed new RUL lesion in 11/19 and he restart XRT in 12/19, repeat XRT is now completed.   Followup CT chest showed a lesion in the left upper lobe.  He had XRT again, finishing in 9/20.   Echo in 7/20 showed EF 35-40%, basal to mid inferolateral and inferior akinesis,  mildly decreased RV systolic function, mild AS, PASP 22 mmHg.    Patient went into atrial fibrillation in 11/21 and was cardioverted back to NSR in 12/21.  Echo was done today and reviewed, EF 40-45% with inferolateral and basal inferior akinesis, mild MR, mild RV dilation with mildly decreased RV systolic function, normal IVC.   He returns for followup of CHF and CAD.  He is in NSR today. He is feeling better in NSR.  No dyspnea walking around the house.  No chest pain.  No  orthopnea/PND.  Weight is up about 3 lbs.  No edema.       ECG (personally reviewed): A-BiV sequential pacing.   Labs 3/15 Cr 1.5 K 5.6 Labs 07/06/14 K 3.9 Creatinine  0.98 Labs 9/15 K 5.1, creatinine 0.96 Labs 11/15 K 3.8, creatinine 0.83, LDL 61, LFTs normal, TSH normal Labs 11/26/14 K 5.0 Creatinine 0.73 , LFTs normal, TSH normal,  Labs 12/08/14 K 3.4 Creatinine 0.83  Labs 3/16 K 4.4, creatinine 1.03, LFTs normal, TSH normal, HCT 37.8, BNP 65 Labs 6/16 K 4.1, creatinine 0.93, TSH normal, LFTs normal Labs 11/16 K 5.1, creatinine 1.13, LFTs normal, TSH normal, LDL 83, HDL 33, TGs 162 Labs 1/17 K 4.4, creatinine 0.86, pro-BNP 154 Labs 2/17 BNP 113, K 3.8, creatinine 0.79, TSH normal, LFTs normal Labs 4/17 K 3.8, creatinine 0.91, HCT 48.1 Labs 6/17 K 3.5, creatinine 0.98 Labs 9/17 K 3.9, creatinine 0.79, LDL 69, HDL 29, TSH normal, LFTs normal Labs 3/18 K 3.6, creatinine 0.87 Labs 8/18 K 3.3 => 4.2, creatinine 0.78 => 1.04, LDL 66, HDL 38, BNP 166 Labs 9/18: K 4.7, creatinine 0.9, BNP 144 Labs 11/18: K 4.8, creatinine 1.05, TSH normal, LFTs normal.  Labs 1/19: K 4.4, creatinine 1.08, LFTs normal Labs 5/19: LDL 60, HDL 28, TSH normal, LFTs normal, K 4.6, creatinine 1.43 Labs 11/19: K 4.6, creatinine 1.42, LFTs normal, TSH normal, Mg 2.3 Labs 1/20: TSH normal, K 4.2, creatinine 1.12, LFTs normal Labs 4/20: LDL 42, HDL 28, TGs 234 Labs 5/20: K 4.8 => 4.4, creatinine 1.95 => 1.38, TSH normal, LFTs normal Labs 7/20: K 4.1, creatinine 1.22 Labs 9/20: LDL 36, HDL 29 Labs 3/21: K 4.5, creatinine 1.76, hgb 11.8 Labs 4/21: K 4.4, creatinine 1.63, hgb 12.8, TSH 4.67 (elevated) Labs 8/21: K 4.5, creatinine 2.37 => 1.66 => 1.45, hgb 12.9, LDL 42 Labs 12/21: K 4, creatinine 1.3  Past Medical History:  Diagnosis Date  . AAA (abdominal aortic aneurysm) ([place])    followed by Dr. Bridgett Larsson  . Adenomatous colon polyp 01/2004  . Anemia    hx  . Automatic implantable cardioverter-defibrillator in situ    . AVM (arteriovenous malformation)   . BPH (benign prostatic hypertrophy)   . CAD (coronary artery disease)    s/p CABGx2 in 1996  . Carotid artery stenosis    LCEA - Dr. Bridgett Larsson in 2013  . CHF (congestive heart failure) ([place])   . Complication of anesthesia    claustrophobic, unabe to lie on back more than 4 hours at time due to back  . COPD (chronic obstructive pulmonary disease) ([place])   . Diabetes mellitus    DIET CONTROLLED- pt states that this was a misdiagnosis, he was treated while in the hospital  but returned home, loss a massive amount of weight and he has not had a problem with his blood sugar since. States everything has been normal for about 3 years.  . Diverticulosis   . Dyspnea   .  Dysrhythmia    ICD-defibrillator  . Fatigue   . GERD (gastroesophageal reflux disease)   . H/O hiatal hernia   . History of radiation therapy 04/09/17-04/17/17   SBRT right lung 54 Gy in 3 fractions  . HLD (hyperlipidemia)   . Hypertension   . Hypothyroidism   . Ischemic cardiomyopathy   . Myocardial infarction ([place])   . 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 OSA on CPAP    AHI durign total sleep 14.69/hr, during REM 50.91/hr  . Peripheral vascular disease (H[facility])    LCEA, L renal artery stent, bilat iliac stents, R SFA stenosis  . Tremor   . Ventricular tachycardia ([place]) 09/09/2014   Amiodarone was started after appropriate defibrillator shocks for ventricular tachycardia in October 2008   Past Surgical History:  Procedure Laterality Date  . ABDOMINAL AORTIC ENDOVASCULAR STENT GRAFT [name] 01/06/2014   Procedure: ABDOMINAL AORTIC ENDOVASCULAR STENT GRAFT- GORE; ULTRASOUND GUIDED;  Surgeon: Conrad Greenleaf, MD;  Location: [facility];  Service: Vascular;  Laterality: [name];  . ANGIOPLASTY     BILATERAL  LE  W/STENTS  . BI-VENTRICULAR IMPLANTABLE CARDIOVERTER DEFIBRILLATOR UPGRADE [name] 10/09/2014   Procedure: BI-VENTRICULAR IMPLANTABLE CARDIOVERTER DEFIBRILLATOR UPGRADE;  Surgeon: Evans Lance, MD;  Location:  [facility] CATH LAB;  Service: Cardiovascular;  Laterality: [name];  . BIV ICD GENERTAOR CHANGE OUT  10/09/2014   UPGRADE TO BIV        BY DR Lovena Le  . 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 Adora Fridge)  . CARDIAC CATHETERIZATION  10/03/2002   SVG sequentially to OM1 & OM2 totally occluded at ostium, SVG to PDA totally occluded within previously placed prox vein graft stent, smal distal AAA, bialt iliac stents with 30% left end-stent restenosis and 50% right end-stent restnosis(Dr. Gerrie Nordmann)  . CARDIAC CATHETERIZATION  06/11/1998   L main with 20% narrowing in distal 1/3; LAD with 1st diagonla having 70% ostial narrowing, 2nd diagonal with 40% narrowing in prox third, LIMA & RIMA widely patent; in-stent restenosis of RCIA with successful PTA and new prox SVTRCA stent for residual disease (Dr. Marella Chimes)  . CARDIAC DEFIBRILLATOR PLACEMENT  06/2005   Guidant Vitality HE - ischemic cardiomyopathy - Dr. Marella Chimes  . CARDIOVERSION [name] 11/19/2020   Procedure: CARDIOVERSION;  Surgeon: Larey Dresser, MD;  Location: [facility] ENDOSCOPY;  Service: Cardiovascular;  Laterality: [name];  . CAROTID ENDARTERECTOMY Left 01/04/12  . CORONARY ANGIOPLASTY  01/08/2004   cutting balloon atherectomy & percutaneous intervention of RCIA in-stent restenosis (Dr. Marella Chimes)  . CORONARY ARTERY BYPASS GRAFT  11/04/1985   x2 - PDA and sequential DX-OM (Dr. Redmond Pulling)  . ENDARTERECTOMY  01/04/2012   Procedure: ENDARTERECTOMY CAROTID;left  Surgeon: Hinda Lenis, MD;  Location: [place];  Service: Vascular;  Laterality: Left;  with patch angioplasty  . FUDUCIAL PLACEMENT [name] 02/23/2017   Procedure: PLACEMENT OF FUDUCIAL;  Surgeon: [name], MD;  Location: [facility];  Service: Thoracic;  Laterality: [name];  . ICD GENERATOR CHANGE  05/02/2010    [employer], retail  . ILIAC ARTERY STENT Bilateral 03/1997   and L SFA PTA  . Iron infusion  June 16, 2012  . LEFT HEART CATHETERIZATION WITH CORONARY ANGIOGRAM [name] 07/06/2014   Procedure: LEFT HEART CATHETERIZATION WITH CORONARY ANGIOGRAM;  Surgeon: Peter M Martinique, MD;  Location: [facility] CATH LAB;  Service: Cardiovascular;  Laterality: [name];  . NM MYOCAR PERF WALL MOTION  09/2012   lexiscan myoview - mod-severe perfusion defect r/t infarct or scar w/mild periinfarct ishcemia in basal inferior, mid inferior, apical inferior, basal inferolateral & mid inferoalteral regions - EF 21% low risk scan  . POLYPECTOMY    . RENAL ARTERY STENT Left 03/24/2004   6x65mm Genesis stent (Dr. Marella Chimes)  . RIGHT/LEFT HEART CATH AND CORONARY ANGIOGRAPHY [name] 12/28/2017   Procedure: RIGHT/LEFT HEART CATH AND CORONARY ANGIOGRAPHY;  Surgeon: Larey Dresser, MD;  Location: [facility] CV LAB;  Service: Cardiovascular;  Laterality: [name];  . TRANSTHORACIC ECHOCARDIOGRAM  12/2012   EF 30-35; LV mod to severely dilated, mod concentric hypertrophy, severe hypokinesis of inferolateral myocardium, moderate hypokineis of anteroseptal region, grade 1 diastolic dysfunction; mod MR; LA mod-severely dialted; RV mod dialted; RA mildly dilated; PA peak pressure 51mmHg  . VIDEO BRONCHOSCOPY WITH ENDOBRONCHIAL NAVIGATION [name] 02/23/2017   Procedure: VIDEO BRONCHOSCOPY WITH ENDOBRONCHIAL NAVIGATION;  Surgeon: [name], MD;  Location: [facility];  Service: Thoracic;  Laterality: [name];     Current Outpatient Medications  Medication Sig Dispense Refill  . albuterol (PROVENTIL HFA;VENTOLIN HFA) 108 (90 BASE) MCG/ACT inhaler Inhale 2 puffs into the lungs every 6 (six) hours as needed for wheezing or shortness of breath. 3 Inhaler 4  . albuterol (PROVENTIL) (2.5 MG/3ML) 0.083% nebulizer solution USE 1 VIAL IN NEBULIZER 4 TIMES DAILY 75 mL 11  . amiodarone (PACERONE) 200 MG tablet Take 1 tablet (200 mg total) by mouth daily. 45 tablet  3  . atorvastatin (LIPITOR) 80 MG tablet TAKE 1 TABLET BY MOUTH  DAILY 90 tablet 3  . ATROVENT HFA 17 MCG/ACT inhaler Inhale 2 puffs into the lungs every 4 (four) hours as needed for wheezing.   12  . carvedilol (COREG) 25 MG tablet TAKE 1 TABLET BY MOUTH  TWICE DAILY WITH A MEAL 180 tablet 3  . dapagliflozin propanediol (FARXIGA) 10 MG TABS tablet Take 1 tablet (10 mg total) by mouth daily before breakfast. 30 tablet 11  . diazepam (VALIUM) 5 MG tablet Take 1 tablet (5 mg total) by mouth every 8 (eight) hours as needed for anxiety. 1-2 tabs every 8 hours if needed for tremor 30 tablet 5  . ezetimibe (ZETIA) 10 MG tablet Take 1 tablet (10 mg total) by mouth daily. 90 tablet 1  . finasteride (PROSCAR) 5 MG tablet Take 5 mg by mouth daily.    . fluticasone (FLONASE) 50 MCG/ACT nasal spray Instill 1 spray in each  nostril daily 32 g 3  . Fluticasone-Umeclidin-Vilant (TRELEGY ELLIPTA) 100-62.5-25 MCG/INH AEPB Inhale 1 puff into the lungs daily. Rinse mouth 60 each 12  . isosorbide mononitrate (IMDUR) 60 MG 24 hr tablet TAKE 1 TABLET BY MOUTH  DAILY 90 tablet 3  . levothyroxine (SYNTHROID) 50 MCG tablet TAKE 1 TABLET BY MOUTH  DAILY BEFORE BREAKFAST 90 tablet 1  . Multiple Vitamins-Minerals (MULTIVITAMIN WITH MINERALS) tablet Take 1 tablet by mouth daily.    . Multiple Vitamins-Minerals (PRESERVISION AREDS 2 PO) Take 1 capsule by mouth at bedtime.    . nitroGLYCERIN (NITROSTAT) 0.4 MG SL tablet PLACE 1 TABLET (0.4 MG TOTAL) UNDER THE TONGUE EVERY 5 (FIVE) MINUTES AS NEEDED FOR CHEST PAIN. 75 tablet 1  . NON FORMULARY at bedtime. CPAP And during the day as needed    . Omega-3 Fatty Acids (FISH OIL) 1000 MG CAPS Take 1,000 mg by mouth daily.    Marland Kitchen  Rivaroxaban (XARELTO) 15 MG TABS tablet Take 1 tablet (15 mg total) by mouth daily with supper. 30 tablet 11  . sacubitril-valsartan (ENTRESTO) 49-51 MG Take 1 tablet by mouth 2 (two) times daily. 180 tablet 3  . torsemide (DEMADEX) 20 MG tablet Take 2 tablets  (40 mg total) by mouth 2 (two) times daily. 120 tablet 11  . traMADol (ULTRAM) 50 MG tablet Take 1 tablet (50 mg total) by mouth every 6 (six) hours as needed. (Patient taking differently: Take 50 mg by mouth every 6 (six) hours as needed for severe pain.) 75 tablet 3  . VASCEPA 1 g capsule TAKE 2 CAPSULES (2 G TOTAL) BY MOUTH 2 (TWO) TIMES A DAY. 120 capsule 11  . potassium chloride SA (KLOR-CON M20) 20 MEQ tablet Take 1 tablet (20 mEq total) by mouth daily. 90 tablet 3   No current facility-administered medications for this encounter.    Allergies:   Tussionex pennkinetic er [hydrocod polst-cpm polst er] and Ace inhibitors   Social History:  The patient  reports that he quit smoking about 18 years ago. His smoking use included cigarettes and pipe. He has a 60.00 pack-year smoking history. He has never used smokeless tobacco. He reports that he does not drink alcohol and does not use drugs.   Family History:  The patient's family history includes Cancer in his mother and sister; Colon cancer in his sister; Diabetes in his daughter, mother, and sister; Heart attack in his father and maternal grandmother; Heart disease in his father, mother, and sister; Hyperlipidemia in his mother and sister; Hypertension in his father, mother, and sister; Lung cancer in his mother; Parkinson's disease in his sister.   ROS:  Please see the history of present illness.   All other systems are personally reviewed and negative.   Exam:   BP 128/70   Pulse 70   Wt 125 kg (275 lb 9.6 oz)   SpO2 96%   BMI 41.90 kg/m  Exam:  (Video/Tele Health Call; Exam is subjective and or/visual.) General:  Speaks in full sentences. No resp difficulty. Neck: No JVD Lungs: Normal respiratory effort with conversation.  Abdomen: Non-distended per patient report Extremities: Pt denies edema. Neuro: Alert & oriented x 3.   Recent Labs: 02/27/2020: Magnesium 2.3 12/24/2020: ALT 19; BUN 30; Creatinine, Ser 1.45; Hemoglobin 14.3;  Platelets 152; Potassium 4.1; Sodium 141; TSH 3.971  Personally reviewed   Wt Readings from Last 3 Encounters:  12/24/20 125 kg (275 lb 9.6 oz)  11/30/20 124.6 kg (274 lb 9.6 oz)  11/12/20 123.6 kg (272 lb 6.4 oz)      ASSESSMENT AND PLAN:  1. Chronic systolic CHF: Ischemic cardiomyopathy. Echo in 8/18 showed EF 30% with severe LV dilation and normal RV.  [employer] CRT-D.  RHC in 1/19 showed normal filling pressures and preserved cardiac output.  Echo in 7/20 showed EF 35-40% with mildly decreased RV systolic function.  Echo done today showed EF 40-45%, mildly decreased RV systolic function.  NYHA class III symptoms, improved after DCCV to NSR in 12/21.  Volume status looks ok.  - Continue torsemide 40 mg bid after that.    - Continue Coreg 25 mg bid.   - I think we can increase Entresto today to 49/51 bid with BMET today and in 10 days.      - Continue spironolactone 25 mg daily.  - Continue dapagliflozin 10 mg daily.  2. CAD s/p CABG: LHC 1/19 with stable anatomy (LAD is patent, other  vessels occluded with collaterals from the LAD).  He has had angina with atrial fibrillation/RVR runs, however this seems to be better on Imdur.  No recent chest pain.   - Continue atorvastatin, good lipids in 8/21.   - He is not on ASA due to Xarelto use.   - Continue Imdur 60 mg daily.  3. VT s/p ICD: On amiodarone 200 mg daily for history of ICD discharges due to VT.   - CMET/TSH today.  He will need a regular eye exam.  4. Morbid obesity: Continue to work on diet/exercise.  5. COPD: Moderate to severe.  Historically, a lot of his dyspnea has seemed to be due to COPD.   - Followup with pulmonary. - Now has home oxygen during the day with CPAP at night.  6. Lung cancer: He completed radiation.  7. PAD: Occluded left SFA and significant right SFA stenosis on last CTA.  ABIs in 3/20 with significant disease bilaterally but stable.  Does not get leg pain with exertion but has nocturnal cramps.   [facility] by [facility], medical management for now.  He is on statin, good lipids in 8/21.  8. AAA: s/p repair.  Followed at [facility].  No endoleak on last evaluation.  9. Tremor: Most likely essential tremor given family history.  However, cannot rule out role for amiodarone. As above, I think that he needs to continue at least a low dose of amiodarone.  Dr. Carles Collet with neurology discussed deep brain stimulation => this is on hold since he had a diagnosis of lung cancer.   10. Hyperlipidemia: He is on Vascepa and atorvastatin.  - Good lipids in 8/21.  11. PVCs: Frequent PVCs noted in the past, decreased with higher BiV pacing base rate.  - Continue amiodarone to suppress PVCs.  - Continue Coreg 25 mg bid. 12. Atrial fibrillation: Persistent, has been in atrial fibrillation for several weeks now per device interrogation with resulting worsened CHF.    - Continue Coreg.  - Continue amiodarone 200 mg daily.  - Continue Xarelto.  13. OSA: Using CPAP.  14. Amaurosis fugax: 8/21, loss of vision transiently in right eye. Possibly related to atrial fibrillation though he has been on Xarelto without missing doses.  15. Carotid stenosis: Most recent carotid dopplers in 9/21 with occluded RICA (chronic), minimal LICA disease.  - Repeat carotids 9/22.   COVID screen The patient does not have any symptoms that suggest any further testing/ screening at this time.  Social distancing reinforced today.  Patient Risk: After full review of this patients clinical status, I feel that they are at moderate risk for cardiac decompensation at this time.  Relevant cardiac medications were reviewed at length with the patient today. The patient does not have concerns regarding their medications at this time.   Recommended follow-up:  6 wks  Today, I have spent 20 minutes with the patient with telehealth technology discussing the above issues .    Signed, [name], MD  12/26/2020  [facility] [address] [facility] [place] [address] [phone] (office) [phone] (fax)

## 2020-12-30 ENCOUNTER — Other Ambulatory Visit: Payer: Self-pay | Admitting: Internal Medicine

## 2021-01-04 ENCOUNTER — Ambulatory Visit (HOSPITAL_COMMUNITY)
Admission: RE | Admit: 2021-01-04 | Discharge: 2021-01-04 | Disposition: A | Discharge status: Home / Self Care | Payer: Medicare Other | Source: Ambulatory Visit | Attending: Cardiology | Admitting: Cardiology

## 2021-01-04 ENCOUNTER — Other Ambulatory Visit: Payer: Self-pay

## 2021-01-04 DIAGNOSIS — I5042 Chronic combined systolic (congestive) and diastolic (congestive) heart failure: Secondary | ICD-10-CM | POA: Diagnosis not present

## 2021-01-04 LAB — BASIC METABOLIC PANEL
Anion gap: 11 (ref 5–15)
BUN: 31 mg/dL — ABNORMAL HIGH (ref 8–23)
CO2: 27 mmol/L (ref 22–32)
Calcium: 9 mg/dL (ref 8.9–10.3)
Chloride: 104 mmol/L (ref 98–111)
Creatinine, Ser: 2.01 mg/dL — ABNORMAL HIGH (ref 0.61–1.24)
GFR, Estimated: 34 mL/min — ABNORMAL LOW (ref >60)
Glucose, Bld: 112 mg/dL — ABNORMAL HIGH (ref 70–99)
Potassium: 4.3 mmol/L (ref 3.5–5.1)
Sodium: 142 mmol/L (ref 135–145)

## 2021-01-06 ENCOUNTER — Other Ambulatory Visit (HOSPITAL_COMMUNITY): Payer: Self-pay | Admitting: *Deleted

## 2021-01-06 DIAGNOSIS — I5042 Chronic combined systolic (congestive) and diastolic (congestive) heart failure: Secondary | ICD-10-CM

## 2021-01-06 MED ORDER — TORSEMIDE 20 MG PO TABS: ORAL_TABLET | ORAL | 11 refills | Status: DC

## 2021-01-11 ENCOUNTER — Other Ambulatory Visit: Payer: Self-pay

## 2021-01-11 ENCOUNTER — Ambulatory Visit (HOSPITAL_COMMUNITY)
Admission: RE | Admit: 2021-01-11 | Discharge: 2021-01-11 | Disposition: A | Discharge status: Home / Self Care | Payer: Medicare Other | Source: Ambulatory Visit | Attending: Cardiology | Admitting: Cardiology

## 2021-01-11 DIAGNOSIS — I5042 Chronic combined systolic (congestive) and diastolic (congestive) heart failure: Secondary | ICD-10-CM | POA: Diagnosis not present

## 2021-01-11 LAB — BASIC METABOLIC PANEL
Anion gap: 12 (ref 5–15)
BUN: 40 mg/dL — ABNORMAL HIGH (ref 8–23)
CO2: 24 mmol/L (ref 22–32)
Calcium: 8.8 mg/dL — ABNORMAL LOW (ref 8.9–10.3)
Chloride: 104 mmol/L (ref 98–111)
Creatinine, Ser: 1.54 mg/dL — ABNORMAL HIGH (ref 0.61–1.24)
GFR, Estimated: 46 mL/min — ABNORMAL LOW (ref >60)
Glucose, Bld: 142 mg/dL — ABNORMAL HIGH (ref 70–99)
Potassium: 4.4 mmol/L (ref 3.5–5.1)
Sodium: 140 mmol/L (ref 135–145)

## 2021-01-14 ENCOUNTER — Telehealth: Payer: Self-pay | Admitting: Internal Medicine

## 2021-01-14 ENCOUNTER — Other Ambulatory Visit: Payer: Self-pay | Admitting: Cardiology

## 2021-01-14 DIAGNOSIS — I10 Essential (primary) hypertension: Secondary | ICD-10-CM

## 2021-01-14 DIAGNOSIS — I2510 Atherosclerotic heart disease of native coronary artery without angina pectoris: Secondary | ICD-10-CM

## 2021-01-14 NOTE — Progress Notes (Signed)
  Chronic Care Management   Note  01/14/2021 Name: [name] MRN: [id number] DOB: 23-Nov-1946  [name] is a 75 y.o. year old male who is a primary care patient of [name], MD. I reached out to [name] by phone today in response to a referral sent by Mr. Cira Rue Asquith's PCP, [name], MD.   Mr. Gallery was given information about Chronic Care Management services today including:  1. CCM service includes personalized support from designated clinical staff supervised by his physician, including individualized plan of care and coordination with other care providers 2. 24/7 contact phone numbers for assistance for urgent and routine care needs. 3. Service will only be billed when office clinical staff spend 20 minutes or more in a month to coordinate care. 4. Only one practitioner may furnish and bill the service in a calendar month. 5. The patient may stop CCM services at any time (effective at the end of the month) by phone call to the office staff.   Patient agreed to services and verbal consent obtained.   Follow up plan:   Carley Perdue [employer] Scheduler

## 2021-01-20 ENCOUNTER — Ambulatory Visit (INDEPENDENT_AMBULATORY_CARE_PROVIDER_SITE_OTHER): Payer: Medicare Other | Admitting: Pharmacist

## 2021-01-20 ENCOUNTER — Other Ambulatory Visit: Payer: Self-pay

## 2021-01-20 DIAGNOSIS — I10 Essential (primary) hypertension: Secondary | ICD-10-CM | POA: Diagnosis not present

## 2021-01-20 DIAGNOSIS — I5042 Chronic combined systolic (congestive) and diastolic (congestive) heart failure: Secondary | ICD-10-CM | POA: Diagnosis not present

## 2021-01-20 DIAGNOSIS — N400 Enlarged prostate without lower urinary tract symptoms: Secondary | ICD-10-CM | POA: Diagnosis not present

## 2021-01-20 DIAGNOSIS — I2510 Atherosclerotic heart disease of native coronary artery without angina pectoris: Secondary | ICD-10-CM | POA: Diagnosis not present

## 2021-01-20 DIAGNOSIS — M545 Low back pain, unspecified: Secondary | ICD-10-CM

## 2021-01-20 DIAGNOSIS — E785 Hyperlipidemia, unspecified: Secondary | ICD-10-CM

## 2021-01-20 DIAGNOSIS — J440 Chronic obstructive pulmonary disease with acute lower respiratory infection: Secondary | ICD-10-CM

## 2021-01-20 DIAGNOSIS — E039 Hypothyroidism, unspecified: Secondary | ICD-10-CM | POA: Diagnosis not present

## 2021-01-20 DIAGNOSIS — J209 Acute bronchitis, unspecified: Secondary | ICD-10-CM | POA: Diagnosis not present

## 2021-01-20 DIAGNOSIS — G8929 Other chronic pain: Secondary | ICD-10-CM

## 2021-01-20 NOTE — Progress Notes (Signed)
Chronic Care Management Pharmacy Note  01/23/2021 Name:  [name] MRN:  [id number] DOB:  1946/02/20  Subjective: [name] is an 75 y.o. year old male who is a primary patient of [name], MD.  The CCM team was consulted for assistance with disease management and care coordination needs.    Engaged with patient by telephone for initial visit in response to provider referral for pharmacy case management and/or care coordination services.   Consent to Services:  The patient was given the following information about Chronic Care Management services today, agreed to services, and gave verbal consent: 1. CCM service includes personalized support from designated clinical staff supervised by the primary care provider, including individualized plan of care and coordination with other care providers 2. 24/7 contact phone numbers for assistance for urgent and routine care needs. 3. Service will only be billed when office clinical staff spend 20 minutes or more in a month to coordinate care. 4. Only one practitioner may furnish and bill the service in a calendar month. 5.The patient may stop CCM services at any time (effective at the end of the month) by phone call to the office staff. 6. The patient will be responsible for cost sharing (co-pay) of up to 20% of the service fee (after annual deductible is met). Patient agreed to services and consent obtained.  Patient Care Team: [name], MD as PCP - Dorisann Frames, MD as Attending Physician (Urology) Larey Dresser, MD as Consulting Physician (Cardiology) Charlton Haws, [facility] as Pharmacist (Pharmacist)  Patient lives at home with his wife. She manages his medications for him and sets up pill box each week.  Recent office visits: 07/14/20 Dr Ronnald Ramp OV: chronic f/u, c/o back pain, stop thiamine. Rx'd tramadol  Recent consult visits: 12/24/20 Dr Aundra Dubin (HF clinic [facility]): CHF NYHA III symptoms improved after DCCV 11/2020.  Increase Entresto to 49/51. Amiodarone may be contributing to tremor but rec'd continue.  12/23/20 Dr Sondra Come (oncology): f/u lung cancer. No clinical evidence of recurrence.   11/30/20 Dr Annamaria Boots (pulmonary): COPD stable, no med changes. Continue O2 2-3 L and CPAP.  11/19/20 admission for cardioversion.  11/12/20 Dr Aundra Dubin (HF clinic): increased torsemide to 60 mg AM and 40 mg PM x 2 days then 40 mg BID. Increased amiodarone to 200 mg.  Hospital visits: None in previous 6 months  Objective:  Lab Results  Component Value Date   CREATININE 1.54 (H) 01/11/2021   BUN 40 (H) 01/11/2021   GFR 51.83 (L) 10/15/2018   GFRNONAA 46 (L) 01/11/2021   GFRAA 54 (L) 08/06/2020   NA 140 01/11/2021   K 4.4 01/11/2021   CALCIUM 8.8 (L) 01/11/2021   CO2 24 01/11/2021    Lab Results  Component Value Date/Time   HGBA1C 5.5 07/14/2020 11:29 AM   HGBA1C 6.4 03/23/2020 11:02 AM   GFR 51.83 (L) 10/15/2018 04:36 PM   GFR 91.65 02/20/2017 11:32 AM    Last diabetic Eye exam:  Lab Results  Component Value Date/Time   HMDIABEYEEXA No Retinopathy 05/09/2018 12:00 AM    Last diabetic Foot exam: No results found for: HMDIABFOOTEX   Lab Results  Component Value Date   CHOL 95 07/14/2020   HDL 30 (L) 07/14/2020   LDLCALC 42 07/14/2020   TRIG 151 (H) 07/14/2020   CHOLHDL 3.2 07/14/2020    Hepatic Function Latest Ref Rng & Units 12/24/2020 08/06/2020 04/30/2020  Total Protein 6.5 - 8.1 g/dL 6.5 6.1(L) 6.3(L)  Albumin 3.5 - 5.0 g/dL 3.9 3.6 3.7  AST 15 - 41 U/L 18 13(L) 16  ALT 0 - 44 U/L _0 Alk Phosphatase 38 - 126 U/L 76 65 70  Total Bilirubin 0.3 - 1.2 mg/dL 0.8 0.5 1.3(H)  Bilirubin, Direct 0.0 - 0.2 mg/dL - - -    Lab Results  Component Value Date/Time   TSH 3.971 12/24/2020 03:44 PM   TSH 3.623 08/06/2020 11:48 AM   TSH 3.34 07/14/2020 11:29 AM   TSH 4.67 (H) 03/23/2020 11:02 AM   FREET4 1.20 04/21/2019 10:01 AM    CBC Latest Ref Rng & Units 12/24/2020 11/19/2020 07/14/2020  WBC  4.0 - 10.5 K/uL 6.9 - 7.1  Hemoglobin 13.0 - 17.0 g/dL 14.3 15.6 12.9(L)  Hematocrit 39.0 - 52.0 % 44.4 46.0 41.6  Platelets 150 - 400 K/uL 152 - 156    No results found for: VD25OH  Clinical ASCVD: Yes  The ASCVD Risk score Mikey Bussing DC Jr., et al., 2013) failed to calculate for the following reasons:   The valid total cholesterol range is 130 to 320 mg/dL    Depression screen [facility] 2/9 03/23/2020 11/21/2018 06/10/2018  Decreased Interest 0 0 0  Down, Depressed, Hopeless 0 0 0  PHQ - 2 Score 0 0 0  Altered sleeping 0 - -  Tired, decreased energy 1 - -  Change in appetite 0 - -  Feeling bad or failure about yourself  0 - -  Trouble concentrating 0 - -  Moving slowly or fidgety/restless 0 - -  Suicidal thoughts 0 - -  PHQ-9 Score 1 - -  Difficult doing work/chores Not difficult at all - -  Some recent data might be hidden     CHA2DS2-VASc Score = 5  The patient's score is based upon: CHF History: Yes HTN History: Yes Diabetes History: No Stroke History: No Vascular Disease History: Yes Age Score: 2 Gender Score: 0     Social History   Tobacco Use  Smoking Status Former Smoker  . Packs/day: 1.50  . Years: 40.00  . Pack years: 60.00  . Types: Cigarettes, Pipe  . Quit date: 12/11/2002  . Years since quitting: 18.1  Smokeless Tobacco Never Used  Tobacco Comment   Pt reports smoking 1 pack/day before quitting in 2004   BP Readings from Last 3 Encounters:  12/24/20 128/70  12/23/20 (!) 126/47  11/30/20 118/62   Pulse Readings from Last 3 Encounters:  12/24/20 70  12/23/20 70  11/30/20 65   Wt Readings from Last 3 Encounters:  12/24/20 275 lb 9.6 oz (125 kg)  11/30/20 274 lb 9.6 oz (124.6 kg)  11/12/20 272 lb 6.4 oz (123.6 kg)    Assessment/Interventions: Review of patient past medical history, allergies, medications, health status, including review of consultants reports, laboratory and other test data, was performed as part of comprehensive evaluation and provision  of chronic care management services.   SDOH:  (Social Determinants of Health) assessments and interventions performed: Yes SDOH Interventions   Flowsheet Row Most Recent Value  SDOH Interventions   Financial Strain Interventions Other (Comment)  [pursue Farxiga PAP, obtained [employer] grant for [place]  Allergies  Allergen Reactions  . Tussionex Pennkinetic Er [Hydrocod Polst-Cpm Polst Er] Other (See Comments)    UNSPECIFIED REACTION  > "caused prostate problems"  . Ace Inhibitors Cough    Medications Reviewed Today    Reviewed by Charlton Haws, [facility] (Pharmacist)  on 01/20/21 at 1613  Med List Status: <None>  Medication Order Taking? Sig Documenting Provider Last Dose Status Informant  albuterol (PROVENTIL HFA;VENTOLIN HFA) 108 (90 BASE) MCG/ACT inhaler [id number] Yes Inhale 2 puffs into the lungs every 6 (six) hours as needed for wheezing or shortness of breath. [name], MD Taking Active Spouse/Significant Other  albuterol (PROVENTIL) (2.5 MG/3ML) 0.083% nebulizer solution [id number] Yes USE 1 VIAL IN NEBULIZER 4 TIMES DAILY Young, Clinton D, MD Taking Active   amiodarone (PACERONE) 200 MG tablet [id number] Yes Take 1 tablet (200 mg total) by mouth daily. Larey Dresser, MD Taking Active   atorvastatin (LIPITOR) 80 MG tablet [id number] Yes TAKE 1 TABLET BY MOUTH  DAILY Larey Dresser, MD Taking Active   ATROVENT HFA 17 MCG/ACT inhaler [id number] Yes Inhale 2 puffs into the lungs every 4 (four) hours as needed for wheezing.  [provider] Taking Active Spouse/Significant Other  carvedilol (COREG) 25 MG tablet [id number] Yes TAKE 1 TABLET BY MOUTH  TWICE DAILY WITH A MEAL Larey Dresser, MD Taking Active   dapagliflozin propanediol (FARXIGA) 10 MG TABS tablet [id number] Yes Take 1 tablet (10 mg total) by mouth daily before breakfast. Larey Dresser, MD Taking Active Spouse/Significant Other  diazepam (VALIUM) 5 MG tablet [id number] Yes Take 1  tablet (5 mg total) by mouth every 8 (eight) hours as needed for anxiety. 1-2 tabs every 8 hours if needed for tremor [name], MD Taking Active   ezetimibe (ZETIA) 10 MG tablet [id number]  Take 1 tablet (10 mg total) by mouth daily. Larey Dresser, MD  Expired 11/21/20 2359 Spouse/Significant Other  finasteride (PROSCAR) 5 MG tablet [id number] Yes Take 5 mg by mouth daily. [provider] Taking Active Spouse/Significant Other  fluticasone (FLONASE) 50 MCG/ACT nasal spray [id number] Yes Instill 1 spray in each  nostril daily Larey Dresser, MD Taking Active   Fluticasone-Umeclidin-Vilant (TRELEGY ELLIPTA) 100-62.5-25 MCG/INH AEPB [id number] Yes Inhale 1 puff into the lungs daily. Rinse mouth Baird Lyons D, MD Taking Active Spouse/Significant Other  isosorbide mononitrate (IMDUR) 60 MG 24 hr tablet [id number] Yes TAKE 1 TABLET BY MOUTH  DAILY Larey Dresser, MD Taking Active   levothyroxine (SYNTHROID) 50 MCG tablet [id number] Yes TAKE 1 TABLET BY MOUTH  DAILY BEFORE Elita Boone [name], MD Taking Active   Multiple Vitamins-Minerals (MULTIVITAMIN WITH MINERALS) tablet [id number] Yes Take 1 tablet by mouth daily. [provider] Taking Active Spouse/Significant Other  Multiple Vitamins-Minerals (PRESERVISION AREDS 2 PO) [id number] Yes Take 1 capsule by mouth at bedtime. [provider] Taking Active Spouse/Significant Other  nitroGLYCERIN (NITROSTAT) 0.4 MG SL tablet [id number] Yes PLACE 1 TABLET UNDER THE TONGUE EVERY 5 MINUTES AS NEEDED FOR CHEST PAIN Larey Dresser, MD Taking Active   NON FORMULARY [id number] Yes at bedtime. CPAP And during the day as needed [provider] Taking Active Spouse/Significant Other  Omega-3 Fatty Acids (FISH OIL) 1000 MG CAPS [id number] Yes Take 1,000 mg by mouth daily. [provider] Taking Active Spouse/Significant Other  potassium chloride SA (KLOR-CON M20) 20 MEQ tablet [id number] Yes Take 1 tablet (20 mEq  total) by mouth daily. Larey Dresser, MD Taking Active   Rivaroxaban (XARELTO) 15 MG TABS tablet [id number] Yes Take 1 tablet (15 mg total) by mouth daily with supper. Larey Dresser, MD Taking Active Spouse/Significant Other  sacubitril-valsartan (ENTRESTO) 49-51 MG [id number] Yes Take 1 tablet by mouth 2 (two) times daily. Larey Dresser, MD Taking Active  spironolactone (ALDACTONE) 25 MG tablet [id number] Yes Take 25 mg by mouth daily. [provider] Taking Active   torsemide (DEMADEX) 20 MG tablet [id number] Yes Take 2 tablets (40 mg total) by mouth in the morning AND 1 tablet (20 mg total) every evening. Larey Dresser, MD Taking Active   traMADol Veatrice Bourbon) 50 MG tablet [id number] Yes Take 1 tablet (50 mg total) by mouth every 6 (six) hours as needed.  Patient taking differently: Take 50 mg by mouth every 6 (six) hours as needed for severe pain.   [name], MD Taking Active            Med Note Rosana Berger, SANDY L   Fri Nov 19, 2020  9:59 AM) Not started yet   VASCEPA 1 g capsule [id number] Yes TAKE 2 CAPSULES (2 G TOTAL) BY MOUTH 2 (TWO) TIMES A DAY. Bensimhon, Shaune Pascal, MD Taking Active           Patient Active Problem List   Diagnosis Date Noted  . Chronic systolic heart failure ([place]) 10/05/2020  . Paroxysmal atrial fibrillation ([place]) 10/05/2020  . Chronic bilateral low back pain without sciatica 07/14/2020  . DDD (degenerative disc disease), lumbar 07/14/2020  . Thiamine deficiency 04/03/2020  . Stage 3a chronic kidney disease ([place]) 03/23/2020  . Tremor, essential 03/23/2020  . Vitamin B12 deficiency anemia due to intrinsic factor deficiency 03/23/2020  . Solitary pulmonary nodule 09/18/2019  . Chronic respiratory failure with hypoxia ([place]) 12/01/2018  . Atherosclerosis of native arteries of extremity with intermittent claudication ([place]) 02/06/2018  . Carotid artery disease ([place]) 10/08/2015  . Carotid artery occlusion with infarction ([place]) 10/08/2015  . AAA  (abdominal aortic aneurysm) without rupture ([place]) 04/02/2015  . Iron deficiency anemia 02/23/2015  . Prediabetes 02/17/2015  . Morbid obesity ([place]) 05/27/2014  . Chronic combined systolic and diastolic CHF, NYHA class 3 ([place]) 03/02/2014  . Diastolic dysfunction- grade 2 03/02/2014  . CAD -SVG-RCA PCI '99, CFX PCI '99. Cath '05 and '08 - medical Rx 03/02/2014  . Biventricular ICD (implantable cardioverter-defibrillator) in place 02/24/2014  . Cardiomyopathy, ischemic- EF 20-25% 2D 02/25/14 11/24/2013  . Hx of CABG 11/24/2013  . PAD (peripheral artery disease) ([place]) 11/24/2013  . Hyperlipidemia with target LDL less than 70 06/18/2013  . OSA on CPAP 04/18/2012  . Hypothyroidism 04/18/2012  . COPD mixed type ([place]) 10/14/2010  . Essential hypertension 11/22/2009  . HYPERTROPHY PROSTATE W/UR OBST & OTH LUTS 11/22/2009  . GERD 03/23/2009    Immunization History  Administered Date(s) Administered  . Fluad Quad(high Dose 65+) 10/26/2020  . Influenza Split 11/13/2011, 12/23/2014, 11/10/2017  . Influenza Whole 10/11/2009, 08/24/2010, 10/11/2012  . Influenza, High Dose Seasonal PF 09/26/2018, 09/29/2019  . Influenza,inj,Quad PF,6+ Mos 08/17/2016  . Influenza-Unspecified 12/04/2013, 11/17/2015  . PFIZER(Purple Top)SARS-COV-2 Vaccination 01/25/2020, 02/16/2020, 10/26/2020  . Pneumococcal Conjugate-13 10/15/2018  . Pneumococcal Polysaccharide-23 12/11/2006, 11/13/2011, 03/23/2020  . Td 12/11/2005  . Tdap 10/15/2018    Conditions to be addressed/monitored:  Hypertension, Hyperlipidemia, Atrial Fibrillation, Heart Failure, Coronary Artery Disease, COPD, Hypothyroidism and BPH, Back pain  Care Plan : CCM Pharmacy Care Plan  Updates made by Charlton Haws, [place] since 01/23/2021 12:00 AM    Problem: Hypertension, Hyperlipidemia, Atrial Fibrillation, Heart Failure, Coronary Artery Disease, COPD, Hypothyroidism and BPH, Back pain   Priority: High    Long-Range Goal: Disease management    Start Date: 01/23/2021  Expected End Date: 07/23/2021  This Visit's Progress: On track  Priority: High  Note:  Current Barriers:  . Unable to independently afford treatment regimen . Unable to independently monitor therapeutic efficacy  Pharmacist Clinical Goal(s):  Marland Kitchen Over the next 90 days, patient will verbalize ability to afford treatment regimen . achieve adherence to monitoring guidelines and medication adherence to achieve therapeutic efficacy through collaboration with PharmD and provider.   Interventions: . 1:1 collaboration with [name], MD regarding development and update of comprehensive plan of care as evidenced by provider attestation and co-signature . Inter-disciplinary care team collaboration (see longitudinal plan of care) . Comprehensive medication review performed; medication list updated in electronic medical record  Hyperlipidemia / CAD (LDL goal < 70) -controlled -s/p CABG, multiple PCI -Current treatment: . Atorvastatin 80 mg daily . Ezetimibe 10 mg daily . Vascepa 1 g - 2 cap BID . Nitroglycerin 0.4 mg SL prn -Educated on Cholesterol goals;  Benefits of statin for ASCVD risk reduction; -Recommended to continue current medication Assessed patient finances. Obtained [employer] grant for high cost Vascepa  Atrial Fibrillation (Goal: prevent stroke and major bleeding) -controlled  -CHADSVASC: 5 -Current treatment: . Rate control: Amiodarone 200 mg daily, carvedilol 25 mg BID . Anticoagulation: Xarelto 15 mg daily -Pt bruises easily but denies bleeding  -Counseled on increased risk of stroke due to Afib and benefits of anticoagulation for stroke prevention; importance of adherence to anticoagulant exactly as prescribed; bleeding risk associated with Xarelto and importance of self-monitoring for signs/symptoms of bleeding; avoidance of NSAIDs due to increased bleeding risk with anticoagulants; seeking medical attention after a head injury or if  there is blood in the urine/stool; -Recommended to continue current medication  Heart Failure (Goal: BP < 130/80, control symptoms and prevent exacerbations) controlled Type: Combined Systolic and Diastolic -NYHA Class: III (marked limitation of activity) -Ejection fraction: 40-45% (Date: 12/24/20) -Current treatment:  Carvedilol 25 mg BID  Isosorbide MN 60 - 1.5 tab (90 mg) daily  Entresto 49-51 mg BID  Torsemide 20 mg - 2 tab AM and 1 tab PM  Farxiga 10 mg daily  Spironolactone 25 mg daily HS  Klor-Con 20 mEq daily  -Current home BP/HR readings: 115/60, HR 60-70 -Educated on Benefits of medications for managing symptoms and prolonging life Importance of weighing daily; if you gain more than 3 pounds in one day or 5 pounds in one week, contact cardiologist Proper diuretic administration and potassium supplementation -Recommended to continue current medication  COPD (Goal: control symptoms and prevent exacerbations) -controlled -Current treatment  . Trelegy Ellipta 1 puff daily . Albuterol HFA prn . Albuterol nebulizer BID . Atrovent HFA  -MMRC/CAT score: not on file -Pulmonary function testing: not on file -Exacerbations requiring treatment in last 6 months: none -Patient reports consistent use of maintenance inhaler -Frequency of rescue inhaler use: rare - uses nebulizer twice a day -Counseled on Proper inhaler technique; Benefits of consistent maintenance inhaler use When to use rescue inhaler -Recommended to continue current medication  Hypothyroidism (Goal: maintain TSH in goal range) -controlled -Current treatment  . Levothyroxine 50 mcg daily -Recommended to continue current medication  Tremor (Goal: improve tremors) -controlled -Current treatment: . Diazepam 5 mg q8h PRN - does not use often, only when going out of house -Medications previously tried/failed: none -Recommended to continue current medication  BPH (Goal: improve urinary  frequency) -controlled -Current treatment  . Finasteride 5 mg daily -Recommended to continue current medication  Back pain (Goal: manage pain) -controlled -Current treatment  . Tramadol 50 mg q6h PRN -Recommended to continue current medication  Health Maintenance -Vaccine  gaps: Shingrix -Current therapy:  . Fluticasone nasal spray PRN . Multivitamin . Preservision . Ferrous sulfate 325 mg daily HS -Patient is satisfied with current therapy and denies issues -Recommended to continue current medication  Patient Goals/Self-Care Activities . Over the next 90 days, patient will:  - take medications as prescribed focus on medication adherence by pill box weigh daily, and contact provider if weight gain of 3 lbs overnight or 5 lbs in a week collaborate with provider on medication access solutions  Follow Up Plan: Telephone follow up appointment with care management team member scheduled for: 3 months      Medication Assistance: - Application for Farxiga   medication assistance program. in process.  Anticipated assistance start date 02/17/21.  See plan of care for additional detail.  -Pt will not qualify for Xarelto until they meet the 4% out-of-pocket minimum  -Pt will not qualify for Trelegy PAP since household income is >250% [employer] -[employer] grant obtained for Vascepa copay. End date 12/20/2021  Pharmacy ID: [id number]  Rx BIN: [id number]  Rx GRP: [id number]  Rx PCN: [id number]  Patient's preferred pharmacy is:  [id number] [place] [place] [place] [address]Phone: [phone]: [phone] [place] [address] [place]Phone: [phone]: [phone] Uses pill box? Yes Pt endorses 100% compliance  We discussed: Current pharmacy is preferred with insurance plan and patient is satisfied with pharmacy services Patient  decided to: Continue current medication management strategy  Care Plan and Follow Up Patient Decision:  Patient agrees to Care Plan and Follow-up.  Plan: Telephone follow up appointment with care management team member scheduled for:  3 months  LCharlene Brooke PharmD, BStephens County HospitalClinical Pharmacist LPerryPrimary [facility] at [phone]

## 2021-01-21 ENCOUNTER — Other Ambulatory Visit: Payer: Self-pay | Admitting: Cardiology

## 2021-01-21 DIAGNOSIS — I2510 Atherosclerotic heart disease of native coronary artery without angina pectoris: Secondary | ICD-10-CM

## 2021-01-23 NOTE — Patient Instructions (Addendum)
Visit Information  Phone number for Pharmacist: [phone]  Thank you for meeting with me to discuss your medications! I look forward to working with you to achieve your health care goals. Below is a summary of what we talked about during the visit:  Goals Addressed            This Visit's Progress   . Manage My Medicine       Timeframe:  Long-Range Goal Priority:  High Start Date:    01/23/21                         Expected End Date:      07/23/21                 Follow Up Date 05/10/2021    - call for medicine refill 2 or 3 days before it runs out - call if I am sick and can't take my medicine - keep a list of all the medicines I take; vitamins and herbals too - use a pillbox to sort medicine  - work with clinical pharmacist to apply for manufacturer assistance for [place] to cover Vascepa copays at [facility]   Why is this important?   . These steps will help you keep on track with your medicines.       Patient Care Plan: CCM Pharmacy Care Plan    Problem Identified: Hypertension, Hyperlipidemia, Atrial Fibrillation, Heart Failure, Coronary Artery Disease, COPD, Hypothyroidism and BPH, Back pain   Priority: High    Long-Range Goal: Disease management   Start Date: 01/23/2021  Expected End Date: 07/23/2021  This Visit's Progress: On track  Priority: High  Note:   Current Barriers:  . Unable to independently afford treatment regimen . Unable to independently monitor therapeutic efficacy  Pharmacist Clinical Goal(s):  Marland Kitchen Over the next 90 days, patient will verbalize ability to afford treatment regimen . achieve adherence to monitoring guidelines and medication adherence to achieve therapeutic efficacy through collaboration with PharmD and provider.   Interventions: . 1:1 collaboration with [name], MD regarding development and update of comprehensive plan of care as evidenced by provider attestation and co-signature . Inter-disciplinary care  team collaboration (see longitudinal plan of care) . Comprehensive medication review performed; medication list updated in electronic medical record  Hyperlipidemia / CAD (LDL goal < 70) -controlled -s/p CABG, multiple PCI -Current treatment: . Atorvastatin 80 mg daily . Ezetimibe 10 mg daily . Vascepa 1 g - 2 cap BID . Nitroglycerin 0.4 mg SL prn -Educated on Cholesterol goals;  Benefits of statin for ASCVD risk reduction; -Recommended to continue current medication Assessed patient finances. Obtained [employer] grant for high cost Vascepa  Atrial Fibrillation (Goal: prevent stroke and major bleeding) -controlled  -CHADSVASC: 5 -Current treatment: . Rate control: Amiodarone 200 mg daily, carvedilol 25 mg BID . Anticoagulation: Xarelto 15 mg daily -Pt bruises easily but denies bleeding  -Counseled on increased risk of stroke due to Afib and benefits of anticoagulation for stroke prevention; importance of adherence to anticoagulant exactly as prescribed; bleeding risk associated with Xarelto and importance of self-monitoring for signs/symptoms of bleeding; avoidance of NSAIDs due to increased bleeding risk with anticoagulants; seeking medical attention after a head injury or if there is blood in the urine/stool; -Recommended to continue current medication  Heart Failure (Goal: BP < 130/80, control symptoms and prevent exacerbations) controlled Type: Combined Systolic and Diastolic -NYHA Class: III (marked limitation of activity) -Ejection  fraction: 40-45% (Date: 12/24/20) -Current treatment:  Carvedilol 25 mg BID  Isosorbide MN 60 - 1.5 tab (90 mg) daily  Entresto 49-51 mg BID  Torsemide 20 mg - 2 tab AM and 1 tab PM  Farxiga 10 mg daily  Spironolactone 25 mg daily HS  Klor-Con 20 mEq daily  -Current home BP/HR readings: 115/60, HR 60-70 -Educated on Benefits of medications for managing symptoms and prolonging life Importance of weighing daily; if you gain more than  3 pounds in one day or 5 pounds in one week, contact cardiologist Proper diuretic administration and potassium supplementation -Recommended to continue current medication  COPD (Goal: control symptoms and prevent exacerbations) -controlled -Current treatment  . Trelegy Ellipta 1 puff daily . Albuterol HFA prn . Albuterol nebulizer BID . Atrovent HFA  -MMRC/CAT score: not on file -Pulmonary function testing: not on file -Exacerbations requiring treatment in last 6 months: none -Patient reports consistent use of maintenance inhaler -Frequency of rescue inhaler use: rare - uses nebulizer twice a day -Counseled on Proper inhaler technique; Benefits of consistent maintenance inhaler use When to use rescue inhaler -Recommended to continue current medication  Hypothyroidism (Goal: maintain TSH in goal range) -controlled -Current treatment  . Levothyroxine 50 mcg daily -Recommended to continue current medication  Tremor (Goal: improve tremors) -controlled -Current treatment: . Diazepam 5 mg q8h PRN - does not use often, only when going out of house -Medications previously tried/failed: none -Recommended to continue current medication  BPH (Goal: improve urinary frequency) -controlled -Current treatment  . Finasteride 5 mg daily -Recommended to continue current medication  Back pain (Goal: manage pain) -controlled -Current treatment  . Tramadol 50 mg q6h PRN -Recommended to continue current medication  Health Maintenance -Vaccine gaps: Shingrix -Current therapy:  . Fluticasone nasal spray PRN . Multivitamin . Preservision . Ferrous sulfate 325 mg daily HS -Patient is satisfied with current therapy and denies issues -Recommended to continue current medication  Patient Goals/Self-Care Activities . Over the next 90 days, patient will:  - take medications as prescribed focus on medication adherence by pill box weigh daily, and contact provider if weight gain of 3 lbs  overnight or 5 lbs in a week collaborate with provider on medication access solutions  Follow Up Plan: Telephone follow up appointment with care management team member scheduled for: 3 months      [name] was given information about Chronic Care Management services today including:  1. CCM service includes personalized support from designated clinical staff supervised by his physician, including individualized plan of care and coordination with other care providers 2. 24/7 contact phone numbers for assistance for urgent and routine care needs. 3. Standard insurance, coinsurance, copays and deductibles apply for chronic care management only during months in which we provide at least 20 minutes of these services. Most insurances cover these services at 100%, however patients may be responsible for any copay, coinsurance and/or deductible if applicable. This service may help you avoid the need for more expensive face-to-face services. 4. Only one practitioner may furnish and bill the service in a calendar month. 5. The patient may stop CCM services at any time (effective at the end of the month) by phone call to the office staff.  Patient agreed to services and verbal consent obtained.   The patient verbalized understanding of instructions, educational materials, and care plan provided today and agreed to receive a mailed copy of patient instructions, educational materials, and care plan.  Telephone follow up appointment with pharmacy team  member scheduled for: 3 months  Charlene Brooke, PharmD, BCACP Clinical Pharmacist [facility] [phone]   Heart Failure Eating Plan Heart failure, also called congestive heart failure, occurs when your heart does not pump blood well enough to meet your body's needs for oxygen-rich blood. Heart failure is a long-term (chronic) condition. Living with heart failure can be challenging. Following your health care provider's  instructions about a healthy lifestyle and working with a dietitian to choose the right foods may help to improve your symptoms. An eating plan for someone with heart failure will include changes that limit the intake of salt (sodium) and unhealthy fat. What are tips for following this plan? Reading food labels  Check food labels for the amount of sodium per serving. Choose foods that have less than 140 mg (milligrams) of sodium in each serving.  Check food labels for the number of calories per serving. This is important if you need to limit your daily calorie intake to lose weight.  Check food labels for the serving size. If you eat more than one serving, you will be eating more sodium and calories than what is listed on the label.  Look for foods that are labeled as "sodium-free," "very low sodium," or "low sodium." ? Foods labeled as "reduced sodium" or "lightly salted" may still have more sodium than what is recommended for you. Cooking  Avoid adding salt when cooking. Ask your health care provider or dietitian before using salt substitutes.  Season food with salt-free seasonings, spices, or herbs. Check the label of seasoning mixes to make sure they do not contain salt.  Cook with heart-healthy oils, such as olive, canola, soybean, or sunflower oil.  Do not fry foods. Cook foods using low-fat methods, such as baking, boiling, grilling, and broiling.  Limit unhealthy fats when cooking by: ? Removing the skin from poultry, such as chicken. ? Removing all visible fats from meats. ? Skimming the fat off from stews, soups, and gravies before serving them. Meal planning  Limit your intake of: ? Processed, canned, or prepackaged foods. ? Foods that are high in trans fat, such as fried foods. ? Sweets, desserts, sugary drinks, and other foods with added sugar. ? Full-fat dairy products, such as whole milk.  Eat a balanced diet. This may include: ? 4-5 servings of fruit each day and  4-5 servings of vegetables each day. At each meal, try to fill one-half of your plate with fruits and vegetables. ? Up to 6-8 servings of whole grains each day. ? Up to 2 servings of lean meat, poultry, or fish each day. One serving of meat is equal to 3 oz (85 g). This is about the same size as a deck of cards. ? 2 servings of low-fat dairy each day. ? Heart-healthy fats. Healthy fats called omega-3 fatty acids are found in foods such as flaxseed and cold-water fish like sardines, salmon, and mackerel.  Aim to eat 25-35 g (grams) of fiber a day. Foods that are high in fiber include apples, broccoli, carrots, beans, peas, and whole grains.  Do not add salt or condiments that contain salt (such as soy sauce) to foods before eating.  When eating at a restaurant, ask that your food be prepared with less salt or no salt, if possible.  Try to eat 2 or more vegetarian meals each week.  Eat more home-cooked food and eat less restaurant, buffet, and fast food.   General information  Do not eat more  than 2,300 mg of sodium a day. The amount of sodium that is recommended for you may be lower, depending on your condition.  Maintain a healthy body weight as directed. Ask your health care provider what a healthy weight is for you. ? Check your weight every day. ? Work with your health care provider and dietitian to make a plan that is right for you to lose weight or maintain your current weight.  Limit how much fluid you drink. Ask your health care provider or dietitian how much fluid you can have each day.  Limit or avoid alcohol as told by your health care provider or dietitian. Recommended foods Fruits All fresh, frozen, and canned fruits. Dried fruits, such as raisins, prunes, and cranberries. Vegetables All fresh vegetables. Vegetables that are frozen without sauce or added salt. Low-sodium or sodium-free canned vegetables. Grains Bread with less than 80 mg of sodium per slice. Whole-wheat  pasta, quinoa, and brown rice. Oats and oatmeal. Barley. [place]. Grits and cream of wheat. Whole-grain and whole-wheat cold cereal. Meats and other protein foods Lean cuts of meat. Skinless chicken and [place]. Fish with high omega-3 fatty acids, such as salmon, sardines, and other cold-water fishes. Eggs. Dried beans, peas, and edamame. Unsalted nuts and nut butters. Dairy Low-fat or nonfat (skim) milk and dried milk. Rice milk, soy milk, and almond milk. Low-fat or nonfat yogurt. Small amounts of reduced-sodium block cheese. Low-sodium cottage cheese. Fats and oils Olive, canola, soybean, flaxseed, avocado, or sunflower oil. Sweets and desserts Applesauce. Granola bars. Sugar-free pudding and gelatin. Frozen fruit bars. Seasoning and other foods Fresh and dried herbs. Lemon or lime juice. Vinegar. Low-sodium ketchup. Salt-free marinades, salad dressings, sauces, and seasonings. The items listed above may not be a complete list of foods and beverages you can eat. Contact a dietitian for more information. Foods to avoid Fruits Fruits that are dried with sodium-containing preservatives. Vegetables Canned vegetables. Frozen vegetables with sauce or seasonings. Creamed vegetables. [place] fries. Onion rings. Pickled vegetables and sauerkraut. Grains Bread with more than 80 mg of sodium per slice. Hot or cold cereal with more than 140 mg sodium per serving. Salted pretzels and crackers. Prepackaged breadcrumbs. Bagels, croissants, and biscuits. Meats and other protein foods Ribs and chicken wings. Bacon, ham, pepperoni, bologna, salami, and packaged luncheon meats. Hot dogs, bratwurst, and sausage. Canned meat. Smoked meat and fish. Salted nuts and seeds. Dairy Whole milk, half-and-half, and cream. Buttermilk. Processed cheese, cheese spreads, and cheese curds. Regular cottage cheese. Feta cheese. Shredded cheese. String cheese. Fats and oils Butter, lard, shortening, ghee, and bacon fat. Canned  and packaged gravies. Seasoning and other foods Onion salt, garlic salt, table salt, and sea salt. Marinades. Regular salad dressings. Relishes, pickles, and olives. Meat flavorings and tenderizers, and bouillon cubes. Horseradish, ketchup, and mustard. Worcestershire sauce. Teriyaki sauce, soy sauce (including reduced sodium). Hot sauce and Tabasco sauce. Steak sauce, fish sauce, oyster sauce, and cocktail sauce. Taco seasonings. Barbecue sauce. Tartar sauce. The items listed above may not be a complete list of foods and beverages you should avoid. Contact a dietitian for more information. Summary  A heart failure eating plan includes changes that limit your intake of sodium and unhealthy fat, and it may help you lose weight or maintain a healthy weight. Your health care provider may also recommend limiting how much fluid you drink.  Most people with heart failure should eat no more than 2,300 mg of salt (sodium) a day. The amount of sodium that is  recommended for you may be lower, depending on your condition.  Contact your health care provider or dietitian before making any major changes to your diet. This information is not intended to replace advice given to you by your health care provider. Make sure you discuss any questions you have with your health care provider. Document Revised: 07/12/2020 Document Reviewed: 07/12/2020 Elsevier Patient Education  2021 [employer].

## 2021-01-29 ENCOUNTER — Other Ambulatory Visit (HOSPITAL_COMMUNITY): Payer: Self-pay | Admitting: Cardiology

## 2021-02-02 ENCOUNTER — Telehealth: Payer: Self-pay | Admitting: Pharmacist

## 2021-02-02 NOTE — Progress Notes (Signed)
   Call was made to [employer] to check on status of patient assistant application for [place]. Representative Alvie Heidelberg) stated that income documentation was not in application. Re-fax with income sheet to [phone].   Wendy Poet, Clinical Pharmacist Assistant [facility] [phone]   Time spent:30 with representative

## 2021-02-02 NOTE — Addendum Note (Signed)
Addended by: Hinda Kehr on: 02/02/2021 04:39 PM   Modules accepted: Orders

## 2021-02-07 NOTE — Progress Notes (Signed)
Called Mr. Sancho this morning and left a message for him to bring proof of income to Dr. Ronnald Ramp office as soon as possible.  [place] [phone]

## 2021-02-09 ENCOUNTER — Telehealth: Payer: Self-pay | Admitting: Pharmacist

## 2021-02-09 NOTE — Progress Notes (Signed)
Made a call to [employer] to check on the status of the patient's patient assistance application. Spoke with representative Kennyth Lose) and told her that the applications was faxed twice once on 01/26/21 and then again on 02/02/21.  She stated that the patient is still missing UHC part D, and Medicare insurance.   Wendy Poet, [place] [phone]   Time spent:23

## 2021-02-11 NOTE — Telephone Encounter (Signed)
Re-faxed [employer] [place]) application.   Ensured insurance info attached.  Will follow up in 3-5 business days.

## 2021-02-15 ENCOUNTER — Other Ambulatory Visit: Payer: Self-pay

## 2021-02-15 ENCOUNTER — Ambulatory Visit (HOSPITAL_COMMUNITY)
Admission: RE | Admit: 2021-02-15 | Discharge: 2021-02-15 | Disposition: A | Discharge status: Home / Self Care | Payer: Medicare Other | Source: Ambulatory Visit | Attending: Cardiology | Admitting: Cardiology

## 2021-02-15 ENCOUNTER — Encounter (HOSPITAL_COMMUNITY): Payer: Self-pay | Admitting: Cardiology

## 2021-02-15 VITALS — BP 138/70 | HR 80 | Wt 271.4 lb

## 2021-02-15 DIAGNOSIS — Z7984 Long term (current) use of oral hypoglycemic drugs: Secondary | ICD-10-CM | POA: Diagnosis not present

## 2021-02-15 DIAGNOSIS — I255 Ischemic cardiomyopathy: Secondary | ICD-10-CM | POA: Diagnosis not present

## 2021-02-15 DIAGNOSIS — I110 Hypertensive heart disease with heart failure: Secondary | ICD-10-CM | POA: Diagnosis not present

## 2021-02-15 DIAGNOSIS — Z6841 Body Mass Index (BMI) 40.0 and over, adult: Secondary | ICD-10-CM | POA: Diagnosis not present

## 2021-02-15 DIAGNOSIS — I472 Ventricular tachycardia: Secondary | ICD-10-CM | POA: Insufficient documentation

## 2021-02-15 DIAGNOSIS — E785 Hyperlipidemia, unspecified: Secondary | ICD-10-CM | POA: Insufficient documentation

## 2021-02-15 DIAGNOSIS — G4733 Obstructive sleep apnea (adult) (pediatric): Secondary | ICD-10-CM | POA: Insufficient documentation

## 2021-02-15 DIAGNOSIS — Z7901 Long term (current) use of anticoagulants: Secondary | ICD-10-CM | POA: Insufficient documentation

## 2021-02-15 DIAGNOSIS — Z8249 Family history of ischemic heart disease and other diseases of the circulatory system: Secondary | ICD-10-CM | POA: Insufficient documentation

## 2021-02-15 DIAGNOSIS — Z801 Family history of malignant neoplasm of trachea, bronchus and lung: Secondary | ICD-10-CM | POA: Diagnosis not present

## 2021-02-15 DIAGNOSIS — Z95810 Presence of automatic (implantable) cardiac defibrillator: Secondary | ICD-10-CM | POA: Diagnosis not present

## 2021-02-15 DIAGNOSIS — G453 Amaurosis fugax: Secondary | ICD-10-CM | POA: Diagnosis not present

## 2021-02-15 DIAGNOSIS — J439 Emphysema, unspecified: Secondary | ICD-10-CM | POA: Diagnosis not present

## 2021-02-15 DIAGNOSIS — I6529 Occlusion and stenosis of unspecified carotid artery: Secondary | ICD-10-CM | POA: Insufficient documentation

## 2021-02-15 DIAGNOSIS — I493 Ventricular premature depolarization: Secondary | ICD-10-CM | POA: Insufficient documentation

## 2021-02-15 DIAGNOSIS — I5022 Chronic systolic (congestive) heart failure: Secondary | ICD-10-CM | POA: Insufficient documentation

## 2021-02-15 DIAGNOSIS — Z87891 Personal history of nicotine dependence: Secondary | ICD-10-CM | POA: Diagnosis not present

## 2021-02-15 DIAGNOSIS — Z951 Presence of aortocoronary bypass graft: Secondary | ICD-10-CM | POA: Insufficient documentation

## 2021-02-15 DIAGNOSIS — R251 Tremor, unspecified: Secondary | ICD-10-CM | POA: Diagnosis not present

## 2021-02-15 DIAGNOSIS — Z923 Personal history of irradiation: Secondary | ICD-10-CM | POA: Diagnosis not present

## 2021-02-15 DIAGNOSIS — I2510 Atherosclerotic heart disease of native coronary artery without angina pectoris: Secondary | ICD-10-CM | POA: Diagnosis not present

## 2021-02-15 DIAGNOSIS — I480 Paroxysmal atrial fibrillation: Secondary | ICD-10-CM | POA: Insufficient documentation

## 2021-02-15 DIAGNOSIS — Z7952 Long term (current) use of systemic steroids: Secondary | ICD-10-CM | POA: Diagnosis not present

## 2021-02-15 DIAGNOSIS — I714 Abdominal aortic aneurysm, without rupture: Secondary | ICD-10-CM | POA: Insufficient documentation

## 2021-02-15 DIAGNOSIS — E1151 Type 2 diabetes mellitus with diabetic peripheral angiopathy without gangrene: Secondary | ICD-10-CM | POA: Insufficient documentation

## 2021-02-15 DIAGNOSIS — Z79899 Other long term (current) drug therapy: Secondary | ICD-10-CM | POA: Diagnosis not present

## 2021-02-15 DIAGNOSIS — G459 Transient cerebral ischemic attack, unspecified: Secondary | ICD-10-CM | POA: Diagnosis not present

## 2021-02-15 LAB — COMPREHENSIVE METABOLIC PANEL
ALT: 16 U/L (ref 0–44)
AST: 16 U/L (ref 15–41)
Albumin: 3.8 g/dL (ref 3.5–5.0)
Alkaline Phosphatase: 78 U/L (ref 38–126)
Anion gap: 9 (ref 5–15)
BUN: 49 mg/dL — ABNORMAL HIGH (ref 8–23)
CO2: 24 mmol/L (ref 22–32)
Calcium: 9.1 mg/dL (ref 8.9–10.3)
Chloride: 103 mmol/L (ref 98–111)
Creatinine, Ser: 1.76 mg/dL — ABNORMAL HIGH (ref 0.61–1.24)
GFR, Estimated: 40 mL/min — ABNORMAL LOW (ref >60)
Glucose, Bld: 128 mg/dL — ABNORMAL HIGH (ref 70–99)
Potassium: 5.1 mmol/L (ref 3.5–5.1)
Sodium: 136 mmol/L (ref 135–145)
Total Bilirubin: 0.6 mg/dL (ref 0.3–1.2)
Total Protein: 6.6 g/dL (ref 6.5–8.1)

## 2021-02-15 LAB — CBC
HCT: 44 % (ref 39.0–52.0)
Hemoglobin: 14 g/dL (ref 13.0–17.0)
MCH: 30.2 pg (ref 26.0–34.0)
MCHC: 31.8 g/dL (ref 30.0–36.0)
MCV: 95 fL (ref 80.0–100.0)
Platelets: 198 10*3/uL (ref 150–400)
RBC: 4.63 MIL/uL (ref 4.22–5.81)
RDW: 14.4 % (ref 11.5–15.5)
WBC: 9.8 10*3/uL (ref 4.0–10.5)
nRBC: 0 % (ref 0.0–0.2)

## 2021-02-15 LAB — MAGNESIUM: Magnesium: 2.4 mg/dL (ref 1.7–2.4)

## 2021-02-15 MED ORDER — DOXYCYCLINE HYCLATE 100 MG PO TABS: 100.0000 mg | ORAL_TABLET | Freq: Two times a day (BID) | ORAL | 0 refills | Status: AC

## 2021-02-15 MED ORDER — PREDNISONE 20 MG PO TABS: 20.0000 mg | ORAL_TABLET | Freq: Every day | ORAL | 0 refills | Status: AC

## 2021-02-15 NOTE — Patient Instructions (Addendum)
EKG done today.  Labs done today. We will contact you only if your labs are abnormal.  INCREASE Torsemide to 40mg (2 tablets) by mouth 2 times daily for 5 days THEN DECREASE back to 40mg  (2 tablets) every morning and 20mg  (1 tablet) by mouth every evening.  START Doxycycline 100mg (1 tablet) by mouth 2 times daily for 7 days.  START Prednisone 20mg  (1 tablet) by mouth daily for 5 days.   No other medication changes were made. Please continue all current medications as prescribed.  Your physician recommends that you schedule a follow-up appointment in: 10 days for a lab only appointment and in 1 month for an appointment with our [facility] here in office.  Your provider recommends that you have an appointment with your Eye Doctor soon.  Please take your at home COVID test today.  Your physician has requested that you have a carotid duplex. This test is an ultrasound of the carotid arteries in your neck. It looks at blood flow through these arteries that supply the brain with blood. Allow one hour for this exam. There are no restrictions or special instructions. This has to be approved through your insurance company prior to scheduling,once approved we will contact you to schedule an appointment.   If you have any questions or concerns before your next appointment please send [place] a message through [place] or call our office at [phone].    TO LEAVE A MESSAGE FOR THE NURSE SELECT OPTION 2, PLEASE LEAVE A MESSAGE INCLUDING: . YOUR NAME . DATE OF BIRTH . CALL BACK NUMBER . REASON FOR CALL**this is important as we prioritize the call backs  YOU WILL RECEIVE A CALL BACK THE SAME DAY AS LONG AS YOU CALL BEFORE 4:00 PM   Do the following things EVERYDAY: 1) Weigh yourself in the morning before breakfast. Write it down and keep it in a log. 2) Take your medicines as prescribed 3) Eat low salt foods--Limit salt (sodium) to 2000 mg per day.  4) Stay as active as you can everyday 5) Limit  all fluids for the day to less than 2 liters   At the [facility], you and your health needs are our priority. As part of our continuing mission to provide you with exceptional heart care, we have created designated Provider Care Teams. These Care Teams include your primary Cardiologist (physician) and Advanced Practice Providers (APPs- Physician Assistants and Nurse Practitioners) who all work together to provide you with the care you need, when you need it.   You may see any of the following providers on your designated Care Team at your next follow up: Marland Kitchen Dr [name] . Dr [name] . Darrick Grinder, NP . Lyda Jester, PA . Audry Riles, PharmD   Please be sure to bring in all your medications bottles to every appointment.

## 2021-02-16 NOTE — Progress Notes (Signed)
ID:  KAWIKA BISCHOFF, DOB [birth year], MRN [id number]   Provider location: [facility] Type of Visit: Established patient   PCP:  [name], MD  Cardiologist:  [name]   History of Present Illness: [name] is a 75 y.o. male who has a past medical history significant for morbid obesity, systolic heart failure with an EF of 30-35% (January 2014) -> 20-25% (March 2015 - RV normal), VT on amio, CAD s/p CABG 1996, LBBB, COPD, obstructive sleep apnea on CPAP, AAA repair (January 2015).   Admitted to [facility] July 24 through July 06 2014 with chest pain.  CEs negative. Had a LHC with no change from previous LHC with recommendations to continue medical management. Discharge weight was 255 pounds.   LHC 07/06/14 --No significant change from previous studies.  Left anterior descending (LAD): The LAD is a large vessel. There is a 40% stenosis immediately after the takeoff of a large septal perforator. There are 2 large diagonal branches without significant disease. Left circumflex (LCx): The LCx is occluded proximally. There are left to left collaterals.  Right coronary artery (RCA): The RCA is occluded proximally immediately following the conus branch. There are right to right and left to right collaterals. SVGs from CABG known to be totally occluded.   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   CTA chest/abd/pelvis (3/16) with moderate emphysema, 1.3 cm nodule RUL, left SFA totally occluded, right SFA with significant stenosis, s/p endovascular AAA repair (stable).   Echo (5/17) with EF 20-25%, severe LV dilation, mild MR, normal RV size with mildly decreased systolic function.   He has a RUL nodule that is most likely lung cancer, he is being treated with radiation.   Echo (8/18) with EF 30%, basal-mid inferior and inferolateral AK, severe LV dilation, normal RV, mild MR.   RHC/LHC was done in 1/19 due to worsening exertional dyspnea.  This  showed normal filling pressures and preserved cardiac output.  There was 30-40% mLAD stenosis.  The LAD provided collaterals to the LCx and RCA territories.  The LCx, RCA, SVG-OM, and SVG-PDA were all chronically occluded (known from the past).  No new disease.   [facility] in 2/19 at [facility] with 0.78 on right, 0.59 on left => left iliac > 50% stenosis.  11/19 ABIs 0.78 right, 0.59 left.  3/20 ABIs 0.74 right, 0.66 left.   He saw neurology about his tremor.  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  However, he is on amiodarone which may be contributing.  He has been seeing Dr. Vertell Limber with plan for deep brain stimulator, on hold currently due to cancer treatment.   He had chest radiation for RLL lung cancer.  However, CT showed new RUL lesion in 11/19 and he restart XRT in 12/19, repeat XRT is now completed.   Followup CT chest showed a lesion in the left upper lobe.  He had XRT again, finishing in 9/20.   Echo in 7/20 showed EF 35-40%, basal to mid inferolateral and inferior akinesis, mildly decreased RV systolic function, mild AS, PASP 22 mmHg.    Patient went into atrial fibrillation in 11/21 and was cardioverted back to NSR in 12/21.  Echo in 1/22 showed EF 40-45% with inferolateral and basal inferior akinesis, mild MR, mild RV dilation with mildly decreased RV systolic function, normal IVC.   He returns for followup of CHF and CAD.  He is in NSR today. No chest pain or lightheadedness.  He feels like he has had bronchitis for about 2 wks.  He has been wheezing and more short of breath, getting dyspneic walking around his house.  Weight is down 4 lbs, no edema.  He uses Trelegy and albuterol.  He also reports that he lost the vision in his right lower visual field for about 5 minutes over the weekend with complete resolution.  This happened once before for about 15 minutes around a year ago.  No other neurological symptoms.    ECG (personally reviewed): ?A-BiV sequential pacing (regular but difficult  to tell a-pacing definitively).   Labs 3/15 Cr 1.5 K 5.6 Labs 07/06/14 K 3.9 Creatinine  0.98 Labs 9/15 K 5.1, creatinine 0.96 Labs 11/15 K 3.8, creatinine 0.83, LDL 61, LFTs normal, TSH normal Labs 11/26/14 K 5.0 Creatinine 0.73 , LFTs normal, TSH normal,  Labs 12/08/14 K 3.4 Creatinine 0.83  Labs 3/16 K 4.4, creatinine 1.03, LFTs normal, TSH normal, HCT 37.8, BNP 65 Labs 6/16 K 4.1, creatinine 0.93, TSH normal, LFTs normal Labs 11/16 K 5.1, creatinine 1.13, LFTs normal, TSH normal, LDL 83, HDL 33, TGs 162 Labs 1/17 K 4.4, creatinine 0.86, pro-BNP 154 Labs 2/17 BNP 113, K 3.8, creatinine 0.79, TSH normal, LFTs normal Labs 4/17 K 3.8, creatinine 0.91, HCT 48.1 Labs 6/17 K 3.5, creatinine 0.98 Labs 9/17 K 3.9, creatinine 0.79, LDL 69, HDL 29, TSH normal, LFTs normal Labs 3/18 K 3.6, creatinine 0.87 Labs 8/18 K 3.3 => 4.2, creatinine 0.78 => 1.04, LDL 66, HDL 38, BNP 166 Labs 9/18: K 4.7, creatinine 0.9, BNP 144 Labs 11/18: K 4.8, creatinine 1.05, TSH normal, LFTs normal.  Labs 1/19: K 4.4, creatinine 1.08, LFTs normal Labs 5/19: LDL 60, HDL 28, TSH normal, LFTs normal, K 4.6, creatinine 1.43 Labs 11/19: K 4.6, creatinine 1.42, LFTs normal, TSH normal, Mg 2.3 Labs 1/20: TSH normal, K 4.2, creatinine 1.12, LFTs normal Labs 4/20: LDL 42, HDL 28, TGs 234 Labs 5/20: K 4.8 => 4.4, creatinine 1.95 => 1.38, TSH normal, LFTs normal Labs 7/20: K 4.1, creatinine 1.22 Labs 9/20: LDL 36, HDL 29 Labs 3/21: K 4.5, creatinine 1.76, hgb 11.8 Labs 4/21: K 4.4, creatinine 1.63, hgb 12.8, TSH 4.67 (elevated) Labs 8/21: K 4.5, creatinine 2.37 => 1.66 => 1.45, hgb 12.9, LDL 42 Labs 12/21: K 4, creatinine 1.3 Labs 1/22: TSH normal, LFTs normal Labs 2/22: K 4.4, creatinine 1.54  Past Medical History:  Diagnosis Date  . AAA (abdominal aortic aneurysm) ([place])    followed by Dr. Bridgett Larsson  . Adenomatous colon polyp 01/2004  . Anemia    hx  . Automatic implantable cardioverter-defibrillator in situ   . AVM  (arteriovenous malformation)   . BPH (benign prostatic hypertrophy)   . CAD (coronary artery disease)    s/p CABGx2 in 1996  . Carotid artery stenosis    LCEA - Dr. Bridgett Larsson in 2013  . CHF (congestive heart failure) ([place])   . Complication of anesthesia    claustrophobic, unabe to lie on back more than 4 hours at time due to back  . COPD (chronic obstructive pulmonary disease) ([place])   . Diabetes mellitus    DIET CONTROLLED- pt states that this was a misdiagnosis, he was treated while in the hospital  but returned home, loss a massive amount of weight and he has not had a problem with his blood sugar since. States everything has been normal for about 3 years.  . Diverticulosis   . Dyspnea   . Dysrhythmia  ICD-defibrillator  . Fatigue   . GERD (gastroesophageal reflux disease)   . H/O hiatal hernia   . History of radiation therapy 04/09/17-04/17/17   SBRT right lung 54 Gy in 3 fractions  . HLD (hyperlipidemia)   . Hypertension   . Hypothyroidism   . Ischemic cardiomyopathy   . Myocardial infarction ([place])   . 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 OSA on CPAP    AHI durign total sleep 14.69/hr, during REM 50.91/hr  . Peripheral vascular disease (H[facility])    LCEA, L renal artery stent, bilat iliac stents, R SFA stenosis  . Tremor   . Ventricular tachycardia ([place]) 09/09/2014   Amiodarone was started after appropriate defibrillator shocks for ventricular tachycardia in October 2008   Past Surgical History:  Procedure Laterality Date  . ABDOMINAL AORTIC ENDOVASCULAR STENT GRAFT [name] 01/06/2014   Procedure: ABDOMINAL AORTIC ENDOVASCULAR STENT GRAFT- GORE; ULTRASOUND GUIDED;  Surgeon: Conrad Pasatiempo, MD;  Location: [employer];  Service: Vascular;  Laterality: [name];  . ANGIOPLASTY     BILATERAL  LE  W/STENTS  . BI-VENTRICULAR IMPLANTABLE CARDIOVERTER DEFIBRILLATOR UPGRADE [name] 10/09/2014   Procedure: BI-VENTRICULAR IMPLANTABLE CARDIOVERTER DEFIBRILLATOR UPGRADE;  Surgeon: Evans Lance, MD;  Location: [facility] CATH  LAB;  Service: Cardiovascular;  Laterality: [name];  . BIV ICD GENERTAOR CHANGE OUT  10/09/2014   UPGRADE TO BIV        BY DR Lovena Le  . 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 Adora Fridge)  . CARDIAC CATHETERIZATION  10/03/2002   SVG sequentially to OM1 & OM2 totally occluded at ostium, SVG to PDA totally occluded within previously placed prox vein graft stent, smal distal AAA, bialt iliac stents with 30% left end-stent restenosis and 50% right end-stent restnosis(Dr. Gerrie Nordmann)  . CARDIAC CATHETERIZATION  06/11/1998   L main with 20% narrowing in distal 1/3; LAD with 1st diagonla having 70% ostial narrowing, 2nd diagonal with 40% narrowing in prox third, LIMA & RIMA widely patent; in-stent restenosis of RCIA with successful PTA and new prox SVTRCA stent for residual disease (Dr. Marella Chimes)  . CARDIAC DEFIBRILLATOR PLACEMENT  06/2005   Guidant Vitality HE - ischemic cardiomyopathy - Dr. Marella Chimes  . CARDIOVERSION [name] 11/19/2020   Procedure: CARDIOVERSION;  Surgeon: Larey Dresser, MD;  Location: [facility] ENDOSCOPY;  Service: Cardiovascular;  Laterality: [name];  . CAROTID ENDARTERECTOMY Left 01/04/12  . CORONARY ANGIOPLASTY  01/08/2004   cutting balloon atherectomy & percutaneous intervention of RCIA in-stent restenosis (Dr. Marella Chimes)  . CORONARY ARTERY BYPASS GRAFT  11/04/1985   x2 - PDA and sequential DX-OM (Dr. Redmond Pulling)  . ENDARTERECTOMY  01/04/2012   Procedure: ENDARTERECTOMY CAROTID;left  Surgeon: Hinda Lenis, MD;  Location: [place];  Service: Vascular;  Laterality: Left;  with patch angioplasty  . FUDUCIAL PLACEMENT [name] 02/23/2017   Procedure: PLACEMENT OF FUDUCIAL;  Surgeon: [name], MD;  Location: [place];  Service: Thoracic;  Laterality: [name];  . ICD GENERATOR CHANGE  05/02/2010   [employer]  . ILIAC ARTERY STENT Bilateral 03/1997   and L SFA PTA  . Iron infusion  June 16, 2012  . LEFT HEART CATHETERIZATION WITH CORONARY ANGIOGRAM [name] 07/06/2014   Procedure: LEFT HEART CATHETERIZATION WITH CORONARY ANGIOGRAM;  Surgeon: Peter M Martinique, MD;  Location[facility] CATH  LAB;  Service: Cardiovascular;  Laterality: [name];  . NM MYOCAR PERF WALL MOTION  09/2012   lexiscan myoview - mod-severe perfusion defect r/t infarct or scar w/mild periinfarct ishcemia in basal inferior, mid inferior, apical inferior, basal inferolateral & mid inferoalteral regions - EF 21% low risk scan  . POLYPECTOMY    . RENAL ARTERY STENT Left 03/24/2004   6x49mm Genesis stent (Dr. Marella Chimes)  . RIGHT/LEFT HEART CATH AND CORONARY ANGIOGRAPHY [name] 12/28/2017   Procedure: RIGHT/LEFT HEART CATH AND CORONARY ANGIOGRAPHY;  Surgeon: Larey Dresser, MD;  Location: [place] CV LAB;  Service: Cardiovascular;  Laterality: [name];  . TRANSTHORACIC ECHOCARDIOGRAM  12/2012   EF 30-35; LV mod to severely dilated, mod concentric hypertrophy, severe hypokinesis of inferolateral myocardium, moderate hypokineis of anteroseptal region, grade 1 diastolic dysfunction; mod MR; LA mod-severely dialted; RV mod dialted; RA mildly dilated; PA peak pressure 4mmHg  . VIDEO BRONCHOSCOPY WITH ENDOBRONCHIAL NAVIGATION [name] 02/23/2017   Procedure: VIDEO BRONCHOSCOPY WITH ENDOBRONCHIAL NAVIGATION;  Surgeon: [name], MD;  Location: [place];  Service: Thoracic;  Laterality: [name];     Current Outpatient Medications  Medication Sig Dispense Refill  . albuterol (PROVENTIL HFA;VENTOLIN HFA) 108 (90 BASE) MCG/ACT inhaler Inhale 2 puffs into the lungs every 6 (six) hours as needed for wheezing or shortness of breath. 3 Inhaler 4  . albuterol (PROVENTIL) (2.5 MG/3ML) 0.083% nebulizer solution USE 1 VIAL IN NEBULIZER 4 TIMES DAILY 120 mL 11  . amiodarone (PACERONE) 200 MG tablet Take 1 tablet (200 mg total) by mouth daily. 45 tablet 3  .  atorvastatin (LIPITOR) 80 MG tablet TAKE 1 TABLET BY MOUTH EVERY DAY 30 tablet 1  . ATROVENT HFA 17 MCG/ACT inhaler Inhale 2 puffs into the lungs every 4 (four) hours as needed for wheezing.   12  . carvedilol (COREG) 25 MG tablet TAKE 1 TABLET BY MOUTH  TWICE DAILY WITH A MEAL 180 tablet 3  . dapagliflozin propanediol (FARXIGA) 10 MG TABS tablet Take 1 tablet (10 mg total) by mouth daily before breakfast. 30 tablet 11  . diazepam (VALIUM) 5 MG tablet Take 1 tablet (5 mg total) by mouth every 8 (eight) hours as needed for anxiety. 1-2 tabs every 8 hours if needed for tremor 30 tablet 5  . doxycycline (VIBRA-TABS) 100 MG tablet Take 1 tablet (100 mg total) by mouth 2 (two) times daily for 7 days. 14 tablet 0  . ezetimibe (ZETIA) 10 MG tablet Take 1 tablet (10 mg total) by mouth daily. 90 tablet 1  . finasteride (PROSCAR) 5 MG tablet Take 5 mg by mouth daily.    . fluticasone (FLONASE) 50 MCG/ACT nasal spray Instill 1 spray in each  nostril daily 32 g 3  . Fluticasone-Umeclidin-Vilant (TRELEGY ELLIPTA) 100-62.5-25 MCG/INH AEPB Inhale 1 puff into the lungs daily. Rinse mouth 60 each 12  . isosorbide mononitrate (IMDUR) 60 MG 24 hr tablet TAKE 1 TABLET BY MOUTH  DAILY 90 tablet 3  . levothyroxine (SYNTHROID) 50 MCG tablet TAKE 1 TABLET BY MOUTH  DAILY BEFORE BREAKFAST 90 tablet 1  . Multiple Vitamins-Minerals (MULTIVITAMIN WITH MINERALS) tablet Take 1 tablet by mouth daily.    . Multiple Vitamins-Minerals (PRESERVISION AREDS 2 PO) Take 1 capsule by mouth at bedtime.    . nitroGLYCERIN (NITROSTAT) 0.4 MG SL tablet PLACE 1 TABLET UNDER THE TONGUE EVERY 5 MINUTES AS NEEDED FOR CHEST PAIN. 75 tablet 1  . NON FORMULARY at bedtime. CPAP And during the day as needed    .  potassium chloride SA (KLOR-CON M20) 20 MEQ tablet Take 1 tablet (20 mEq total) by mouth daily. 90 tablet 3  . predniSONE (DELTASONE) 20 MG tablet Take 1 tablet (20 mg total) by mouth daily with breakfast for 5 days. 5 tablet 0  .  Rivaroxaban (XARELTO) 15 MG TABS tablet Take 1 tablet (15 mg total) by mouth daily with supper. 30 tablet 11  . sacubitril-valsartan (ENTRESTO) 49-51 MG Take 1 tablet by mouth 2 (two) times daily. 180 tablet 3  . spironolactone (ALDACTONE) 25 MG tablet Take 25 mg by mouth daily.    Marland Kitchen torsemide (DEMADEX) 20 MG tablet Take 2 tablets (40 mg total) by mouth in the morning AND 1 tablet (20 mg total) every evening. 120 tablet 11  . traMADol (ULTRAM) 50 MG tablet Take 1 tablet (50 mg total) by mouth every 6 (six) hours as needed. 75 tablet 3  . VASCEPA 1 g capsule TAKE 2 CAPSULES (2 G TOTAL) BY MOUTH 2 (TWO) TIMES A DAY. 120 capsule 11   No current facility-administered medications for this encounter.    Allergies:   Tussionex pennkinetic er [hydrocod polst-cpm polst er] and Ace inhibitors   Social History:  The patient  reports that he quit smoking about 18 years ago. His smoking use included cigarettes and pipe. He has a 60.00 pack-year smoking history. He has never used smokeless tobacco. He reports that he does not drink alcohol and does not use drugs.   Family History:  The patient's family history includes Cancer in his mother and sister; Colon cancer in his sister; Diabetes in his daughter, mother, and sister; Heart attack in his father and maternal grandmother; Heart disease in his father, mother, and sister; Hyperlipidemia in his mother and sister; Hypertension in his father, mother, and sister; Lung cancer in his mother; Parkinson's disease in his sister.   ROS:  Please see the history of present illness.   All other systems are personally reviewed and negative.   Exam:   BP 138/70   Pulse 80   Wt 123.1 kg (271 lb 6.4 oz)   SpO2 96%   BMI 41.27 kg/m  General: NAD Neck: JVP 7-8 cm, no thyromegaly or thyroid nodule.  Lungs: Distant BS CV: Nondisplaced PMI.  Heart regular S1/S2, no S3/S4, no murmur.  1+ ankle edema R>L.  No carotid bruit.  Normal pedal pulses.  Abdomen: Soft, nontender,  no hepatosplenomegaly, no distention.  Skin: Intact without lesions or rashes.  Neurologic: Alert and oriented x 3.  Psych: Normal affect. Extremities: No clubbing or cyanosis.  HEENT: Normal.   Recent Labs: 12/24/2020: TSH 3.971 02/15/2021: ALT 16; BUN 49; Creatinine, Ser 1.76; Hemoglobin 14.0; Magnesium 2.4; Platelets 198; Potassium 5.1; Sodium 136  Personally reviewed   Wt Readings from Last 3 Encounters:  02/15/21 123.1 kg (271 lb 6.4 oz)  12/24/20 125 kg (275 lb 9.6 oz)  11/30/20 124.6 kg (274 lb 9.6 oz)      ASSESSMENT AND PLAN:  1. Chronic systolic CHF: Ischemic cardiomyopathy. Echo in 8/18 showed EF 30% with severe LV dilation and normal RV.  [employer] CRT-D.  RHC in 1/19 showed normal filling pressures and preserved cardiac output.  Echo in 7/20 showed EF 35-40% with mildly decreased RV systolic function.  Echo in 1/22 showed EF 40-45%, mildly decreased RV systolic function.  NYHA class III symptoms, worse recently but concerned for COPD exacerbation.  Suspect no more than mild volume overload.  - Increase torsemide to 40 mg bid x  5 days then decrease back to 40 qam/20 qpm.  BMET today and in 10 days.  - Continue Coreg 25 mg bid.   - Continue Entresto 49/51 bid.      - Continue spironolactone 25 mg daily.  - Continue dapagliflozin 10 mg daily.  2. CAD s/p CABG: LHC 1/19 with stable anatomy (LAD is patent, other vessels occluded with collaterals from the LAD).  He has had angina with atrial fibrillation/RVR runs, however this seems to be better on Imdur.  No recent chest pain.   - Continue atorvastatin, good lipids in 8/21.   - He is not on ASA due to Xarelto use.   - Continue Imdur 60 mg daily.  3. VT s/p ICD: On amiodarone 200 mg daily for history of ICD discharges due to VT.  LFTs and TSH normal recently, he will need a regular eye exam. 4. Morbid obesity: Continue to work on diet/exercise.  5. COPD: Moderate to severe.  Historically, a lot of his dyspnea has seemed  to be due to COPD.  I suspect that currently he is having a COPD exacerbation.  Audibly wheezing with distant breath sounds bilaterally on exam.  - Continue home oxygen during the day with CPAP at night.  - Continue his baseline inhalers. - I will given him prednisone 20 mg daily x 5 days and doxycycline 100 mg bid x 7 days.  If this does not help, he will need to see pulmonary.  - He should take a COVID test (has one at home).  6. Lung cancer: He completed radiation.  7. PAD: Occluded left SFA and significant right SFA stenosis on last CTA.  ABIs in 3/20 with significant disease bilaterally but stable.  Does not get leg pain with exertion but has nocturnal cramps.  Followed by [facility], medical management for now.  He is on statin, good lipids in 8/21.  8. AAA: s/p repair.  Followed at [facility].  No endoleak on last evaluation.  9. Tremor: Most likely essential tremor given family history.  However, cannot rule out role for amiodarone. As above, I think that he needs to continue at least a low dose of amiodarone.  Dr. Carles Collet with neurology discussed deep brain stimulation => this is on hold since he had a diagnosis of lung cancer.   10. Hyperlipidemia: He is on Vascepa and atorvastatin.  - Good lipids in 8/21.  11. PVCs: Frequent PVCs noted in the past, decreased with higher BiV pacing base rate.  - Continue amiodarone to suppress PVCs.  - Continue Coreg 25 mg bid. 12. Atrial fibrillation: Paroxysmal, s/p DCCV in 12/21.  Regular rhythm today, I think that he is a-paced but atrial pacing spikes are difficult to discern with certainty.    - Continue Coreg.  - Continue amiodarone 200 mg daily.  - Continue Xarelto.  13. OSA: Using CPAP.  14. Amaurosis fugax: 8/21, loss of vision transiently in right eye. Possibly related to atrial fibrillation though he has been on Xarelto without missing doses.  He describes another episode for about 5 minutes of right lower visual field loss for about 5 minutes last weekend.  This also sounds like amaurosis fugax. He has not missed Xarelto.  - Continue Xarelto.  - Check carotid dopplers.   - Followup with his ophthalmologist.  15. Carotid stenosis: Most recent carotid dopplers in 9/21 with occluded RICA (chronic), minimal LICA disease.  - Repeat carotid dopplers as above with concern for amaurosis fugax.   Signed, [name], MD  02/16/2021  [place] [address] [phone] (office) [phone] (fax)

## 2021-02-20 ENCOUNTER — Other Ambulatory Visit (HOSPITAL_COMMUNITY): Payer: Self-pay | Admitting: Cardiology

## 2021-02-21 ENCOUNTER — Other Ambulatory Visit (HOSPITAL_COMMUNITY): Payer: Self-pay | Admitting: Internal Medicine

## 2021-02-25 ENCOUNTER — Other Ambulatory Visit: Payer: Self-pay

## 2021-02-25 ENCOUNTER — Ambulatory Visit (HOSPITAL_COMMUNITY)
Admission: RE | Admit: 2021-02-25 | Discharge: 2021-02-25 | Disposition: A | Discharge status: Home / Self Care | Payer: Medicare Other | Source: Ambulatory Visit | Attending: Cardiology | Admitting: Cardiology

## 2021-02-25 ENCOUNTER — Ambulatory Visit (HOSPITAL_BASED_OUTPATIENT_CLINIC_OR_DEPARTMENT_OTHER)
Admission: RE | Admit: 2021-02-25 | Discharge: 2021-02-25 | Disposition: A | Discharge status: Home / Self Care | Payer: Medicare Other | Source: Ambulatory Visit | Attending: Cardiology | Admitting: Cardiology

## 2021-02-25 DIAGNOSIS — Z87891 Personal history of nicotine dependence: Secondary | ICD-10-CM | POA: Insufficient documentation

## 2021-02-25 DIAGNOSIS — Z8673 Personal history of transient ischemic attack (TIA), and cerebral infarction without residual deficits: Secondary | ICD-10-CM | POA: Diagnosis not present

## 2021-02-25 DIAGNOSIS — I5022 Chronic systolic (congestive) heart failure: Secondary | ICD-10-CM

## 2021-02-25 DIAGNOSIS — I2510 Atherosclerotic heart disease of native coronary artery without angina pectoris: Secondary | ICD-10-CM | POA: Insufficient documentation

## 2021-02-25 DIAGNOSIS — E785 Hyperlipidemia, unspecified: Secondary | ICD-10-CM | POA: Diagnosis not present

## 2021-02-25 DIAGNOSIS — I10 Essential (primary) hypertension: Secondary | ICD-10-CM | POA: Diagnosis not present

## 2021-02-25 DIAGNOSIS — G459 Transient cerebral ischemic attack, unspecified: Secondary | ICD-10-CM | POA: Insufficient documentation

## 2021-02-25 DIAGNOSIS — I779 Disorder of arteries and arterioles, unspecified: Secondary | ICD-10-CM | POA: Insufficient documentation

## 2021-02-25 LAB — BASIC METABOLIC PANEL
Anion gap: 6 (ref 5–15)
BUN: 57 mg/dL — ABNORMAL HIGH (ref 8–23)
CO2: 23 mmol/L (ref 22–32)
Calcium: 8.8 mg/dL — ABNORMAL LOW (ref 8.9–10.3)
Chloride: 110 mmol/L (ref 98–111)
Creatinine, Ser: 1.64 mg/dL — ABNORMAL HIGH (ref 0.61–1.24)
GFR, Estimated: 43 mL/min — ABNORMAL LOW (ref >60)
Glucose, Bld: 107 mg/dL — ABNORMAL HIGH (ref 70–99)
Potassium: 5.2 mmol/L — ABNORMAL HIGH (ref 3.5–5.1)
Sodium: 139 mmol/L (ref 135–145)

## 2021-03-07 ENCOUNTER — Ambulatory Visit (HOSPITAL_COMMUNITY)
Admission: RE | Admit: 2021-03-07 | Discharge: 2021-03-07 | Disposition: A | Discharge status: Home / Self Care | Payer: Medicare Other | Source: Ambulatory Visit | Attending: Internal Medicine | Admitting: Internal Medicine

## 2021-03-07 ENCOUNTER — Other Ambulatory Visit: Payer: Self-pay

## 2021-03-07 DIAGNOSIS — I5022 Chronic systolic (congestive) heart failure: Secondary | ICD-10-CM | POA: Insufficient documentation

## 2021-03-07 LAB — BASIC METABOLIC PANEL
Anion gap: 5 (ref 5–15)
BUN: 36 mg/dL — ABNORMAL HIGH (ref 8–23)
CO2: 28 mmol/L (ref 22–32)
Calcium: 9 mg/dL (ref 8.9–10.3)
Chloride: 108 mmol/L (ref 98–111)
Creatinine, Ser: 1.58 mg/dL — ABNORMAL HIGH (ref 0.61–1.24)
GFR, Estimated: 45 mL/min — ABNORMAL LOW (ref >60)
Glucose, Bld: 101 mg/dL — ABNORMAL HIGH (ref 70–99)
Potassium: 5.6 mmol/L — ABNORMAL HIGH (ref 3.5–5.1)
Sodium: 141 mmol/L (ref 135–145)

## 2021-03-08 ENCOUNTER — Telehealth: Payer: Self-pay | Admitting: Pharmacist

## 2021-03-08 ENCOUNTER — Other Ambulatory Visit (HOSPITAL_COMMUNITY): Payer: Medicare Other

## 2021-03-08 ENCOUNTER — Telehealth (HOSPITAL_COMMUNITY): Payer: Self-pay | Admitting: *Deleted

## 2021-03-08 DIAGNOSIS — I5022 Chronic systolic (congestive) heart failure: Secondary | ICD-10-CM

## 2021-03-08 NOTE — Telephone Encounter (Signed)
-----   Message from Larey Dresser, MD sent at 03/07/2021  4:45 PM EDT ----- Make sure he has stopped KCl supplement and high K foods/drinks.  Would like to keep him on spironolactone.  Have him hold spironolactone for a day and start Veltassa 8.4 g daily.  BMET 1 week.

## 2021-03-08 NOTE — Telephone Encounter (Signed)
[name], [place]  03/08/2021 10:19 AM EDT Back to Top     Pts wife returned call. She stated pt stopped potassium and has been watching the potassium in his diet. She is aware of med changes and lab appt scheduled. Veltassa samples left at the front desk for pick up.   [place], RN  03/07/2021 5:05 PM EDT      Left message to call back    Larey Dresser, MD  03/07/2021 4:45 PM EDT      Make sure he has stopped KCl supplement and high K foods/drinks. Would like to keep him on spironolactone. Have him hold spironolactone for a day and start Veltassa 8.4 g daily. BMET 1 week.

## 2021-03-08 NOTE — Progress Notes (Signed)
    Chronic Care Management Pharmacy Assistant   Name: [name]  MRN: [id number] DOB: 06-24-1946   Reason for Encounter: Chart Review    Medications: Outpatient Encounter Medications as of 03/08/2021  Medication Sig Note  . albuterol (PROVENTIL HFA;VENTOLIN HFA) 108 (90 BASE) MCG/ACT inhaler Inhale 2 puffs into the lungs every 6 (six) hours as needed for wheezing or shortness of breath.   Marland Kitchen albuterol (PROVENTIL) (2.5 MG/3ML) 0.083% nebulizer solution USE 1 VIAL IN NEBULIZER 4 TIMES DAILY   . amiodarone (PACERONE) 200 MG tablet Take 1 tablet (200 mg total) by mouth daily.   Marland Kitchen atorvastatin (LIPITOR) 80 MG tablet TAKE 1 TABLET BY MOUTH EVERY DAY   . ATROVENT HFA 17 MCG/ACT inhaler Inhale 2 puffs into the lungs every 4 (four) hours as needed for wheezing.    . carvedilol (COREG) 25 MG tablet TAKE 1 TABLET BY MOUTH  TWICE DAILY WITH A MEAL   . dapagliflozin propanediol (FARXIGA) 10 MG TABS tablet Take 1 tablet (10 mg total) by mouth daily before breakfast.   . diazepam (VALIUM) 5 MG tablet Take 1 tablet (5 mg total) by mouth every 8 (eight) hours as needed for anxiety. 1-2 tabs every 8 hours if needed for tremor   . ezetimibe (ZETIA) 10 MG tablet TAKE 1 TABLET BY MOUTH  DAILY   . finasteride (PROSCAR) 5 MG tablet Take 5 mg by mouth daily.   . fluticasone (FLONASE) 50 MCG/ACT nasal spray Instill 1 spray in each  nostril daily   . Fluticasone-Umeclidin-Vilant (TRELEGY ELLIPTA) 100-62.5-25 MCG/INH AEPB Inhale 1 puff into the lungs daily. Rinse mouth   . icosapent Ethyl (VASCEPA) 1 g capsule TAKE 2 CAPSULES (2 G TOTAL) BY MOUTH 2 (TWO) TIMES A DAY.   . isosorbide mononitrate (IMDUR) 60 MG 24 hr tablet TAKE 1 TABLET BY MOUTH  DAILY   . levothyroxine (SYNTHROID) 50 MCG tablet TAKE 1 TABLET BY MOUTH  DAILY BEFORE BREAKFAST   . Multiple Vitamins-Minerals (MULTIVITAMIN WITH MINERALS) tablet Take 1 tablet by mouth daily.   . Multiple Vitamins-Minerals (PRESERVISION AREDS 2 PO) Take 1 capsule by  mouth at bedtime.   . nitroGLYCERIN (NITROSTAT) 0.4 MG SL tablet PLACE 1 TABLET UNDER THE TONGUE EVERY 5 MINUTES AS NEEDED FOR CHEST PAIN.   . NON FORMULARY at bedtime. CPAP And during the day as needed   . potassium chloride SA (KLOR-CON M20) 20 MEQ tablet Take 1 tablet (20 mEq total) by mouth daily.   . Rivaroxaban (XARELTO) 15 MG TABS tablet Take 1 tablet (15 mg total) by mouth daily with supper.   . sacubitril-valsartan (ENTRESTO) 49-51 MG Take 1 tablet by mouth 2 (two) times daily.   Marland Kitchen spironolactone (ALDACTONE) 25 MG tablet Take 25 mg by mouth daily.   Marland Kitchen torsemide (DEMADEX) 20 MG tablet Take 2 tablets (40 mg total) by mouth in the morning AND 1 tablet (20 mg total) every evening.   . traMADol (ULTRAM) 50 MG tablet Take 1 tablet (50 mg total) by mouth every 6 (six) hours as needed. 02/15/2021: .   No facility-administered encounter medications on file as of 03/08/2021.    Reviewed chart for medication changes and adherence.    No gaps in adherence identified. Patient has follow up scheduled with pharmacy team. No further action required.   Alicia [phone]

## 2021-03-09 DIAGNOSIS — G453 Amaurosis fugax: Secondary | ICD-10-CM | POA: Diagnosis not present

## 2021-03-09 DIAGNOSIS — H353111 Nonexudative age-related macular degeneration, right eye, early dry stage: Secondary | ICD-10-CM | POA: Diagnosis not present

## 2021-03-09 DIAGNOSIS — Z961 Presence of intraocular lens: Secondary | ICD-10-CM | POA: Diagnosis not present

## 2021-03-09 DIAGNOSIS — H353112 Nonexudative age-related macular degeneration, right eye, intermediate dry stage: Secondary | ICD-10-CM | POA: Diagnosis not present

## 2021-03-14 DIAGNOSIS — H35373 Puckering of macula, bilateral: Secondary | ICD-10-CM | POA: Diagnosis not present

## 2021-03-14 DIAGNOSIS — H353132 Nonexudative age-related macular degeneration, bilateral, intermediate dry stage: Secondary | ICD-10-CM | POA: Diagnosis not present

## 2021-03-14 DIAGNOSIS — G453 Amaurosis fugax: Secondary | ICD-10-CM | POA: Diagnosis not present

## 2021-03-14 DIAGNOSIS — H35033 Hypertensive retinopathy, bilateral: Secondary | ICD-10-CM | POA: Diagnosis not present

## 2021-03-16 ENCOUNTER — Other Ambulatory Visit: Payer: Self-pay

## 2021-03-16 ENCOUNTER — Ambulatory Visit (HOSPITAL_COMMUNITY)
Admission: RE | Admit: 2021-03-16 | Discharge: 2021-03-16 | Disposition: A | Discharge status: Home / Self Care | Payer: Medicare Other | Source: Ambulatory Visit | Attending: Cardiology | Admitting: Cardiology

## 2021-03-16 DIAGNOSIS — I5022 Chronic systolic (congestive) heart failure: Secondary | ICD-10-CM | POA: Diagnosis not present

## 2021-03-16 LAB — BASIC METABOLIC PANEL
Anion gap: 5 (ref 5–15)
BUN: 29 mg/dL — ABNORMAL HIGH (ref 8–23)
CO2: 28 mmol/L (ref 22–32)
Calcium: 9.1 mg/dL (ref 8.9–10.3)
Chloride: 109 mmol/L (ref 98–111)
Creatinine, Ser: 1.45 mg/dL — ABNORMAL HIGH (ref 0.61–1.24)
GFR, Estimated: 50 mL/min — ABNORMAL LOW (ref >60)
Glucose, Bld: 106 mg/dL — ABNORMAL HIGH (ref 70–99)
Potassium: 4.2 mmol/L (ref 3.5–5.1)
Sodium: 142 mmol/L (ref 135–145)

## 2021-03-17 ENCOUNTER — Telehealth (HOSPITAL_COMMUNITY): Payer: Self-pay

## 2021-03-17 NOTE — Telephone Encounter (Signed)
Received a fax requesting medical records from [facility]. Records were successfully faxed to: [phone] ,which was the number provided.. Medical request form will be scanned into patients chart.

## 2021-03-21 ENCOUNTER — Encounter (HOSPITAL_COMMUNITY): Payer: Self-pay

## 2021-03-21 ENCOUNTER — Other Ambulatory Visit: Payer: Self-pay

## 2021-03-21 ENCOUNTER — Ambulatory Visit (HOSPITAL_COMMUNITY)
Admission: RE | Admit: 2021-03-21 | Discharge: 2021-03-21 | Disposition: A | Discharge status: Home / Self Care | Payer: Medicare Other | Source: Ambulatory Visit | Attending: Cardiology | Admitting: Cardiology

## 2021-03-21 VITALS — BP 116/72 | HR 70 | Wt 279.8 lb

## 2021-03-21 DIAGNOSIS — Z7984 Long term (current) use of oral hypoglycemic drugs: Secondary | ICD-10-CM | POA: Diagnosis not present

## 2021-03-21 DIAGNOSIS — G453 Amaurosis fugax: Secondary | ICD-10-CM | POA: Insufficient documentation

## 2021-03-21 DIAGNOSIS — I714 Abdominal aortic aneurysm, without rupture: Secondary | ICD-10-CM | POA: Diagnosis not present

## 2021-03-21 DIAGNOSIS — I110 Hypertensive heart disease with heart failure: Secondary | ICD-10-CM | POA: Insufficient documentation

## 2021-03-21 DIAGNOSIS — Z951 Presence of aortocoronary bypass graft: Secondary | ICD-10-CM | POA: Diagnosis not present

## 2021-03-21 DIAGNOSIS — Z95810 Presence of automatic (implantable) cardiac defibrillator: Secondary | ICD-10-CM | POA: Diagnosis not present

## 2021-03-21 DIAGNOSIS — Z923 Personal history of irradiation: Secondary | ICD-10-CM | POA: Insufficient documentation

## 2021-03-21 DIAGNOSIS — E785 Hyperlipidemia, unspecified: Secondary | ICD-10-CM | POA: Diagnosis not present

## 2021-03-21 DIAGNOSIS — I5022 Chronic systolic (congestive) heart failure: Secondary | ICD-10-CM | POA: Diagnosis not present

## 2021-03-21 DIAGNOSIS — Z8249 Family history of ischemic heart disease and other diseases of the circulatory system: Secondary | ICD-10-CM | POA: Insufficient documentation

## 2021-03-21 DIAGNOSIS — G4733 Obstructive sleep apnea (adult) (pediatric): Secondary | ICD-10-CM | POA: Insufficient documentation

## 2021-03-21 DIAGNOSIS — I480 Paroxysmal atrial fibrillation: Secondary | ICD-10-CM | POA: Diagnosis not present

## 2021-03-21 DIAGNOSIS — Z6841 Body Mass Index (BMI) 40.0 and over, adult: Secondary | ICD-10-CM | POA: Diagnosis not present

## 2021-03-21 DIAGNOSIS — R251 Tremor, unspecified: Secondary | ICD-10-CM | POA: Insufficient documentation

## 2021-03-21 DIAGNOSIS — I255 Ischemic cardiomyopathy: Secondary | ICD-10-CM | POA: Insufficient documentation

## 2021-03-21 DIAGNOSIS — Z7901 Long term (current) use of anticoagulants: Secondary | ICD-10-CM | POA: Insufficient documentation

## 2021-03-21 DIAGNOSIS — I493 Ventricular premature depolarization: Secondary | ICD-10-CM | POA: Diagnosis not present

## 2021-03-21 DIAGNOSIS — Z79899 Other long term (current) drug therapy: Secondary | ICD-10-CM | POA: Insufficient documentation

## 2021-03-21 DIAGNOSIS — I472 Ventricular tachycardia: Secondary | ICD-10-CM | POA: Diagnosis not present

## 2021-03-21 DIAGNOSIS — E1151 Type 2 diabetes mellitus with diabetic peripheral angiopathy without gangrene: Secondary | ICD-10-CM | POA: Insufficient documentation

## 2021-03-21 DIAGNOSIS — J449 Chronic obstructive pulmonary disease, unspecified: Secondary | ICD-10-CM | POA: Diagnosis not present

## 2021-03-21 DIAGNOSIS — Z801 Family history of malignant neoplasm of trachea, bronchus and lung: Secondary | ICD-10-CM | POA: Insufficient documentation

## 2021-03-21 DIAGNOSIS — Z87891 Personal history of nicotine dependence: Secondary | ICD-10-CM | POA: Diagnosis not present

## 2021-03-21 DIAGNOSIS — I2510 Atherosclerotic heart disease of native coronary artery without angina pectoris: Secondary | ICD-10-CM | POA: Insufficient documentation

## 2021-03-21 LAB — BASIC METABOLIC PANEL
Anion gap: 7 (ref 5–15)
BUN: 27 mg/dL — ABNORMAL HIGH (ref 8–23)
CO2: 28 mmol/L (ref 22–32)
Calcium: 8.7 mg/dL — ABNORMAL LOW (ref 8.9–10.3)
Chloride: 106 mmol/L (ref 98–111)
Creatinine, Ser: 1.44 mg/dL — ABNORMAL HIGH (ref 0.61–1.24)
GFR, Estimated: 50 mL/min — ABNORMAL LOW (ref >60)
Glucose, Bld: 105 mg/dL — ABNORMAL HIGH (ref 70–99)
Potassium: 4.4 mmol/L (ref 3.5–5.1)
Sodium: 141 mmol/L (ref 135–145)

## 2021-03-21 MED ORDER — TORSEMIDE 20 MG PO TABS: 40.0000 mg | ORAL_TABLET | Freq: Two times a day (BID) | ORAL | 11 refills | Status: DC

## 2021-03-21 NOTE — Progress Notes (Signed)
ID:  BEN Small, DOB [birth year], MRN [id number]   Provider location: [facility] Type of Visit: Established patient   PCP:  [name], MD  Cardiologist:  [name]   History of Present Illness: [name] is a 75 y.o. male who has a past medical history significant for morbid obesity, systolic heart failure with an EF of 30-35% (January 2014) -> 20-25% (March 2015 - RV normal), VT on amio, CAD s/p CABG 1996, LBBB, COPD, obstructive sleep apnea on CPAP, AAA repair (January 2015).   Admitted to [facility] July 24 through July 06 2014 with chest pain.  CEs negative. Had a LHC with no change from previous LHC with recommendations to continue medical management. Discharge weight was 255 pounds.   LHC 07/06/14 --No significant change from previous studies.  Left anterior descending (LAD): The LAD is a large vessel. There is a 40% stenosis immediately after the takeoff of a large septal perforator. There are 2 large diagonal branches without significant disease. Left circumflex (LCx): The LCx is occluded proximally. There are left to left collaterals.  Right coronary artery (RCA): The RCA is occluded proximally immediately following the conus branch. There are right to right and left to right collaterals. SVGs from CABG known to be totally occluded.   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   CTA chest/abd/pelvis (3/16) with moderate emphysema, 1.3 cm nodule RUL, left SFA totally occluded, right SFA with significant stenosis, s/p endovascular AAA repair (stable).   Echo (5/17) with EF 20-25%, severe LV dilation, mild MR, normal RV size with mildly decreased systolic function.   He has a RUL nodule that is most likely lung cancer, he is being treated with radiation.   Echo (8/18) with EF 30%, basal-mid inferior and inferolateral AK, severe LV dilation, normal RV, mild MR.   RHC/LHC was done in 1/19 due to worsening exertional dyspnea.  This  showed normal filling pressures and preserved cardiac output.  There was 30-40% mLAD stenosis.  The LAD provided collaterals to the LCx and RCA territories.  The LCx, RCA, SVG-OM, and SVG-PDA were all chronically occluded (known from the past).  No new disease.   [facility] at [facility] with 0.78 on right, 0.59 on left => left iliac > 50% stenosis.  11/19 ABIs 0.78 right, 0.59 left.  3/20 ABIs 0.74 right, 0.66 left.   He saw neurology about his tremor.  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  However, he is on amiodarone which may be contributing.  He has been seeing Dr. Vertell Limber with plan for deep brain stimulator, on hold currently due to cancer treatment.   He had chest radiation for RLL lung cancer.  However, CT showed new RUL lesion in 11/19 and he restart XRT in 12/19, repeat XRT is now completed.   Followup CT chest showed a lesion in the left upper lobe.  He had XRT again, finishing in 9/20.   Echo in 7/20 showed EF 35-40%, basal to mid inferolateral and inferior akinesis, mildly decreased RV systolic function, mild AS, PASP 22 mmHg.    Patient went into atrial fibrillation in 11/21 and was cardioverted back to NSR in 12/21.  Echo in 1/22 showed EF 40-45% with inferolateral and basal inferior akinesis, mild MR, mild RV dilation with mildly decreased RV systolic function, normal IVC.   Seen by [name] on 02/15/21. He endorsed increased dyspnea and cough. He was felt to have mid COPD  flare and also midly fluid overloaded on exam. He was prescribed prednisone 20 mg daily x 5 days and doxycycline 100 mg bid x 7 days.  He was instructed to increase torsemide to 40 mg bid x 5 days then decrease back to 40 qam/20 qpm. He has also reported  That he had lost vision in his right lower visual field for about 5 minutes a few days prior w/ complete resolution.  This happened once before for about 15 minutes around a year ago.  No other neurological symptoms. [name] ordered carotid dopplers. Study was  completed 3/18 and showed totally occluded RICA. The left w/ only minimal disease. Repeat study in 1 year was recommended. He was also referred to opthalmology.   He presents back today for f/u. Here w/ his wife. His wt is actually up 8 lb from previous visit. ReDs clip elevated at 42%. He feels that his breathing has improved some but his wife disagrees. He still seems visibly SOB to her when he ambulates around the house. He denies any wheezing or cough. Reports full med compliance. Device interrogated. No Afib.     ECG (personally reviewed): not performed today, no afib on device interrogation.    Labs 3/15 Cr 1.5 K 5.6 Labs 07/06/14 K 3.9 Creatinine  0.98 Labs 9/15 K 5.1, creatinine 0.96 Labs 11/15 K 3.8, creatinine 0.83, LDL 61, LFTs normal, TSH normal Labs 11/26/14 K 5.0 Creatinine 0.73 , LFTs normal, TSH normal,  Labs 12/08/14 K 3.4 Creatinine 0.83  Labs 3/16 K 4.4, creatinine 1.03, LFTs normal, TSH normal, HCT 37.8, BNP 65 Labs 6/16 K 4.1, creatinine 0.93, TSH normal, LFTs normal Labs 11/16 K 5.1, creatinine 1.13, LFTs normal, TSH normal, LDL 83, HDL 33, TGs 162 Labs 1/17 K 4.4, creatinine 0.86, pro-BNP 154 Labs 2/17 BNP 113, K 3.8, creatinine 0.79, TSH normal, LFTs normal Labs 4/17 K 3.8, creatinine 0.91, HCT 48.1 Labs 6/17 K 3.5, creatinine 0.98 Labs 9/17 K 3.9, creatinine 0.79, LDL 69, HDL 29, TSH normal, LFTs normal Labs 3/18 K 3.6, creatinine 0.87 Labs 8/18 K 3.3 => 4.2, creatinine 0.78 => 1.04, LDL 66, HDL 38, BNP 166 Labs 9/18: K 4.7, creatinine 0.9, BNP 144 Labs 11/18: K 4.8, creatinine 1.05, TSH normal, LFTs normal.  Labs 1/19: K 4.4, creatinine 1.08, LFTs normal Labs 5/19: LDL 60, HDL 28, TSH normal, LFTs normal, K 4.6, creatinine 1.43 Labs 11/19: K 4.6, creatinine 1.42, LFTs normal, TSH normal, Mg 2.3 Labs 1/20: TSH normal, K 4.2, creatinine 1.12, LFTs normal Labs 4/20: LDL 42, HDL 28, TGs 234 Labs 5/20: K 4.8 => 4.4, creatinine 1.95 => 1.38, TSH normal, LFTs  normal Labs 7/20: K 4.1, creatinine 1.22 Labs 9/20: LDL 36, HDL 29 Labs 3/21: K 4.5, creatinine 1.76, hgb 11.8 Labs 4/21: K 4.4, creatinine 1.63, hgb 12.8, TSH 4.67 (elevated) Labs 8/21: K 4.5, creatinine 2.37 => 1.66 => 1.45, hgb 12.9, LDL 42 Labs 12/21: K 4, creatinine 1.3 Labs 1/22: TSH normal, LFTs normal Labs 2/22: K 4.4, creatinine 1.54 Labs 3/22: K 5.6, creatinine 1.58 Labs 4/22: K 4.2, creatinine 1.45   Past Medical History:  Diagnosis Date  . AAA (abdominal aortic aneurysm) ([place])    followed by Dr. Bridgett Larsson  . Adenomatous colon polyp 01/2004  . Anemia    hx  . Automatic implantable cardioverter-defibrillator in situ   . AVM (arteriovenous malformation)   . BPH (benign prostatic hypertrophy)   . CAD (coronary artery disease)    s/p CABGx2 in 1996  .  Carotid artery stenosis    LCEA - Dr. Bridgett Larsson in 2013  . CHF (congestive heart failure) ([place])   . Complication of anesthesia    claustrophobic, unabe to lie on back more than 4 hours at time due to back  . COPD (chronic obstructive pulmonary disease) ([place])   . Diabetes mellitus    DIET CONTROLLED- pt states that this was a misdiagnosis, he was treated while in the hospital  but returned home, loss a massive amount of weight and he has not had a problem with his blood sugar since. States everything has been normal for about 3 years.  . Diverticulosis   . Dyspnea   . Dysrhythmia    ICD-defibrillator  . Fatigue   . GERD (gastroesophageal reflux disease)   . H/O hiatal hernia   . History of radiation therapy 04/09/17-04/17/17   SBRT right lung 54 Gy in 3 fractions  . HLD (hyperlipidemia)   . Hypertension   . Hypothyroidism   . Ischemic cardiomyopathy   . Myocardial infarction ([place])   . 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 OSA on CPAP    AHI durign total sleep 14.69/hr, during REM 50.91/hr  . Peripheral vascular disease ([facility])    LCEA, L renal artery stent, bilat iliac stents, R SFA stenosis  . Tremor   . Ventricular tachycardia  ([place]) 09/09/2014   Amiodarone was started after appropriate defibrillator shocks for ventricular tachycardia in October 2008   Past Surgical History:  Procedure Laterality Date  . ABDOMINAL AORTIC ENDOVASCULAR STENT GRAFT [name] 01/06/2014   Procedure: ABDOMINAL AORTIC ENDOVASCULAR STENT GRAFT- GORE; ULTRASOUND GUIDED;  Surgeon: Conrad Penhook, MD;  Location: [place];  Service: Vascular;  Laterality: [name];  . ANGIOPLASTY     BILATERAL  LE  W/STENTS  . BI-VENTRICULAR IMPLANTABLE CARDIOVERTER DEFIBRILLATOR UPGRADE [name] 10/09/2014   Procedure: BI-VENTRICULAR IMPLANTABLE CARDIOVERTER DEFIBRILLATOR UPGRADE;  Surgeon: Evans Lance, MD;  Location: [facility] CATH LAB;  Service: Cardiovascular;  Laterality: [name];  . BIV ICD GENERTAOR CHANGE OUT  10/09/2014   UPGRADE TO BIV        BY DR Lovena Le  . 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 Adora Fridge)  . CARDIAC CATHETERIZATION  10/03/2002   SVG sequentially to OM1 & OM2 totally occluded at ostium, SVG to PDA totally occluded within previously placed prox vein graft stent, smal distal AAA, bialt iliac stents with 30% left end-stent restenosis and 50% right end-stent restnosis(Dr. Gerrie Nordmann)  . CARDIAC CATHETERIZATION  06/11/1998   L main with 20% narrowing in distal 1/3; LAD with 1st diagonla having 70% ostial narrowing, 2nd diagonal with 40% narrowing in prox third, LIMA & RIMA widely patent; in-stent restenosis of RCIA with successful PTA and new prox SVTRCA stent for residual disease (Dr. Marella Chimes)  . CARDIAC DEFIBRILLATOR PLACEMENT  06/2005   Guidant Vitality HE - ischemic cardiomyopathy - Dr. Marella Chimes  . CARDIOVERSION [name] 11/19/2020   Procedure: CARDIOVERSION;  Surgeon: Larey Dresser, MD;  Location: [facility] ENDOSCOPY;  Service: Cardiovascular;  Laterality: [name];  . CAROTID  ENDARTERECTOMY Left 01/04/12  . CORONARY ANGIOPLASTY  01/08/2004   cutting balloon atherectomy & percutaneous intervention of RCIA in-stent restenosis (Dr. Marella Chimes)  . CORONARY ARTERY BYPASS GRAFT  11/04/1985   x2 - PDA and sequential DX-OM (Dr. Redmond Pulling)  .  ENDARTERECTOMY  01/04/2012   Procedure: ENDARTERECTOMY CAROTID;left  Surgeon: Hinda Lenis, MD;  Location: [place];  Service: Vascular;  Laterality: Left;  with patch angioplasty  . FUDUCIAL PLACEMENT [name] 02/23/2017   Procedure: PLACEMENT OF FUDUCIAL;  Surgeon: [name], MD;  Location: [facility];  Service: Thoracic;  Laterality: [name];  . ICD GENERATOR CHANGE  05/02/2010   [employer], retail  . ILIAC ARTERY STENT Bilateral 03/1997   and L SFA PTA  . Iron infusion  June 16, 2012  . LEFT HEART CATHETERIZATION WITH CORONARY ANGIOGRAM [name] 07/06/2014   Procedure: LEFT HEART CATHETERIZATION WITH CORONARY ANGIOGRAM;  Surgeon: Peter M Martinique, MD;  Location: [facility] CATH LAB;  Service: Cardiovascular;  Laterality: [name];  . NM MYOCAR PERF WALL MOTION  09/2012   lexiscan myoview - mod-severe perfusion defect r/t infarct or scar w/mild periinfarct ishcemia in basal inferior, mid inferior, apical inferior, basal inferolateral & mid inferoalteral regions - EF 21% low risk scan  . POLYPECTOMY    . RENAL ARTERY STENT Left 03/24/2004   6x7mm Genesis stent (Dr. Marella Chimes)  . RIGHT/LEFT HEART CATH AND CORONARY ANGIOGRAPHY [name] 12/28/2017   Procedure: RIGHT/LEFT HEART CATH AND CORONARY ANGIOGRAPHY;  Surgeon: Larey Dresser, MD;  Location: [place] CV LAB;  Service: Cardiovascular;  Laterality: [name];  . TRANSTHORACIC ECHOCARDIOGRAM  12/2012   EF 30-35; LV mod to severely dilated, mod concentric hypertrophy, severe hypokinesis of inferolateral myocardium, moderate hypokineis of anteroseptal region, grade 1 diastolic dysfunction; mod MR; LA mod-severely dialted; RV mod dialted; RA mildly dilated; PA peak pressure 38mmHg  . VIDEO BRONCHOSCOPY WITH  ENDOBRONCHIAL NAVIGATION [name] 02/23/2017   Procedure: VIDEO BRONCHOSCOPY WITH ENDOBRONCHIAL NAVIGATION;  Surgeon: [name], MD;  Location: [place];  Service: Thoracic;  Laterality: [name];     Current Outpatient Medications  Medication Sig Dispense Refill  . albuterol (PROVENTIL HFA;VENTOLIN HFA) 108 (90 BASE) MCG/ACT inhaler Inhale 2 puffs into the lungs every 6 (six) hours as needed for wheezing or shortness of breath. 3 Inhaler 4  . albuterol (PROVENTIL) (2.5 MG/3ML) 0.083% nebulizer solution USE 1 VIAL IN NEBULIZER 4 TIMES DAILY 120 mL 11  . amiodarone (PACERONE) 200 MG tablet Take 1 tablet (200 mg total) by mouth daily. 45 tablet 3  . atorvastatin (LIPITOR) 80 MG tablet TAKE 1 TABLET BY MOUTH EVERY DAY 30 tablet 1  . ATROVENT HFA 17 MCG/ACT inhaler Inhale 2 puffs into the lungs every 4 (four) hours as needed for wheezing.   12  . carvedilol (COREG) 25 MG tablet TAKE 1 TABLET BY MOUTH  TWICE DAILY WITH A MEAL 180 tablet 3  . dapagliflozin propanediol (FARXIGA) 10 MG TABS tablet Take 1 tablet (10 mg total) by mouth daily before breakfast. 30 tablet 11  . diazepam (VALIUM) 5 MG tablet Take 1 tablet (5 mg total) by mouth every 8 (eight) hours as needed for anxiety. 1-2 tabs every 8 hours if needed for tremor 30 tablet 5  . ezetimibe (ZETIA) 10 MG tablet TAKE 1 TABLET BY MOUTH  DAILY 90 tablet 3  . finasteride (PROSCAR) 5 MG tablet Take 5 mg by mouth daily.    . fluticasone (FLONASE) 50 MCG/ACT nasal spray Instill 1 spray in each  nostril daily 32 g 3  . Fluticasone-Umeclidin-Vilant (TRELEGY ELLIPTA) 100-62.5-25 MCG/INH AEPB Inhale 1 puff into the lungs daily. Rinse mouth 60 each 12  . icosapent Ethyl (VASCEPA) 1 g capsule TAKE 2 CAPSULES (2 G TOTAL) BY MOUTH 2 (TWO) TIMES A  DAY. 120 capsule 11  . isosorbide mononitrate (IMDUR) 60 MG 24 hr tablet TAKE 1 TABLET BY MOUTH  DAILY 90 tablet 3  . levothyroxine (SYNTHROID) 50 MCG tablet TAKE 1 TABLET BY MOUTH  DAILY BEFORE BREAKFAST 90 tablet 1   . Multiple Vitamins-Minerals (MULTIVITAMIN WITH MINERALS) tablet Take 1 tablet by mouth daily.    . Multiple Vitamins-Minerals (PRESERVISION AREDS 2 PO) Take 1 capsule by mouth at bedtime.    . nitroGLYCERIN (NITROSTAT) 0.4 MG SL tablet PLACE 1 TABLET UNDER THE TONGUE EVERY 5 MINUTES AS NEEDED FOR CHEST PAIN. 75 tablet 1  . NON FORMULARY at bedtime. CPAP And during the day as needed    . potassium chloride SA (KLOR-CON M20) 20 MEQ tablet Take 1 tablet (20 mEq total) by mouth daily. 90 tablet 3  . Rivaroxaban (XARELTO) 15 MG TABS tablet Take 1 tablet (15 mg total) by mouth daily with supper. 30 tablet 11  . sacubitril-valsartan (ENTRESTO) 49-51 MG Take 1 tablet by mouth 2 (two) times daily. 180 tablet 3  . spironolactone (ALDACTONE) 25 MG tablet Take 25 mg by mouth daily.    Marland Kitchen torsemide (DEMADEX) 20 MG tablet Take 2 tablets (40 mg total) by mouth in the morning AND 1 tablet (20 mg total) every evening. 120 tablet 11  . traMADol (ULTRAM) 50 MG tablet Take 1 tablet (50 mg total) by mouth every 6 (six) hours as needed. 75 tablet 3   No current facility-administered medications for this encounter.    Allergies:   Tussionex pennkinetic er [hydrocod polst-cpm polst er] and Ace inhibitors   Social History:  The patient  reports that he quit smoking about 18 years ago. His smoking use included cigarettes and pipe. He has a 60.00 pack-year smoking history. He has never used smokeless tobacco. He reports that he does not drink alcohol and does not use drugs.   Family History:  The patient's family history includes Cancer in his mother and sister; Colon cancer in his sister; Diabetes in his daughter, mother, and sister; Heart attack in his father and maternal grandmother; Heart disease in his father, mother, and sister; Hyperlipidemia in his mother and sister; Hypertension in his father, mother, and sister; Lung cancer in his mother; Parkinson's disease in his sister.   ROS:  Please see the history of  present illness.   All other systems are personally reviewed and negative.   Exam:   BP 116/72   Pulse 70   Wt 126.9 kg (279 lb 12.8 oz)   SpO2 94%   BMI 42.54 kg/m  ReDS Clip 42%  General:  Well appearing. No respiratory difficulty HEENT: normal Neck: supple. JVD 9-10 cm Carotids 2+ bilat; no bruits. No lymphadenopathy or thyromegaly appreciated. Cor: PMI nondisplaced. Regular rate & rhythm. No rubs, gallops or murmurs. Lungs: decreased BS at the bases bilaterally  Abdomen: soft, nontender, nondistended. No hepatosplenomegaly. No bruits or masses. Good bowel sounds. Extremities: no cyanosis, clubbing, rash, trace- 1 + bilateral LE edema, R>L  Neuro: alert & oriented x 3, cranial nerves grossly intact. moves all 4 extremities w/o difficulty. Affect pleasant.   Recent Labs: 12/24/2020: TSH 3.971 02/15/2021: ALT 16; Hemoglobin 14.0; Magnesium 2.4; Platelets 198 03/16/2021: BUN 29; Creatinine, Ser 1.45; Potassium 4.2; Sodium 142  Personally reviewed   Wt Readings from Last 3 Encounters:  03/21/21 126.9 kg (279 lb 12.8 oz)  02/15/21 123.1 kg (271 lb 6.4 oz)  12/24/20 125 kg (275 lb 9.6 oz)      ASSESSMENT  AND PLAN:  1. Chronic systolic CHF: Ischemic cardiomyopathy. Echo in 8/18 showed EF 30% with severe LV dilation and normal RV.  [employer] CRT-D.  RHC in 1/19 showed normal filling pressures and preserved cardiac output.  Echo in 7/20 showed EF 35-40% with mildly decreased RV systolic function.  Echo in 1/22 showed EF 40-45%, mildly decreased RV systolic function.  NYHA class III symptoms. Fluid overloaded on exam. Wt up 8 lb and ReDs Clip elevated at 42%.  - Increase torsemide to 40 mg bid   - Continue Coreg 25 mg bid.   - Continue Entresto 49/51 bid.      - Continue spironolactone 25 mg daily.  - Continue dapagliflozin 10 mg daily.  - Check BMP today and in 7 days. He has had issues w/ hyperkalemia  and recently has required Veltassa but not currently taking. He would  benefit from staying on Spiro and may need Entresto dose increase. May need daily Veltassa if still hyperkalemic.  2. CAD s/p CABG: LHC 1/19 with stable anatomy (LAD is patent, other vessels occluded with collaterals from the LAD).  He has had angina with atrial fibrillation/RVR runs, however this seems to be better on Imdur.  He denies recent chest pain.   - Continue atorvastatin, good lipids in 8/21.   - He is not on ASA due to Xarelto use.   - Continue Imdur 60 mg daily.  3. VT s/p ICD: On amiodarone 200 mg daily for history of ICD discharges due to VT.  LFTs and TSH normal recently, he will need a regular eye exam. He has been referred to opthalmology.  4. Morbid obesity: Continue to work on diet/exercise.  5. COPD: Moderate to severe.  Historically, a lot of his dyspnea has seemed to be due to COPD. Recently treated w/ course of prednisone and doxy. No further wheezing or cough.  - Continue home oxygen during the day with CPAP at night.  - Continue his baseline inhalers. - f/u w/ pulmonology  6. Lung cancer: He completed radiation.  7. PAD: Occluded left SFA and significant right SFA stenosis on last CTA.  ABIs in 3/20 with significant disease bilaterally but stable.  Does not get leg pain with exertion but has nocturnal cramps.  [facility] by [facility], medical management for now.  He is on statin, good lipids in 8/21.  8. AAA: s/p repair.  Followed at [facility].  No endoleak on last evaluation.  9. Tremor: Most likely essential tremor given family history.  However, cannot rule out role for amiodarone. As above, I think that he needs to continue at least a low dose of amiodarone.  Dr. Carles Collet with neurology discussed deep brain stimulation => this is on hold since he had a diagnosis of lung cancer.   10. Hyperlipidemia: He is on Vascepa and atorvastatin.  - Good lipids in 8/21.  11. PVCs: Frequent PVCs noted in the past, decreased with higher BiV pacing base rate.  - Continue amiodarone to suppress PVCs.  -  Continue Coreg 25 mg bid. 12. Atrial fibrillation: Paroxysmal, s/p DCCV in 12/21.  Regular rhythm today. Device interrogated, no recent Afib detection  - Continue Coreg.  - Continue amiodarone 200 mg daily.  - Continue Xarelto. Denies abnormal bleeding  13. OSA: Using CPAP.  14. Amaurosis fugax: 8/21, loss of vision transiently in right eye. Possibly related to atrial fibrillation though he has been on Xarelto without missing doses. Had similar episode 3/22, lasting ~15 min.   He has not  missed Xarelto. Carotid dopplers 3/22 showed totally occluded RICA, left w/ minimal disease. He denies any further symptoms and has been referred to opthalmology.  - Continue Xarelto.  15. Carotid stenosis: Most recent carotid dopplers in 3/22 with occluded RICA (chronic), minimal LICA disease.  - Repeat carotid dopplers in 1 year  F/u BMP in 1 week + RN visit to repeat ReDs Clip. F/u w/ APP in 4 weeks.   Signed, Lyda Jester, PA-C  03/21/2021  [facility] [address] [facility] [place] [address] [phone] (office) [phone] (fax)

## 2021-03-21 NOTE — Patient Instructions (Signed)
INCREASE Torsemide to 40 mg, twice a day  Labs today We will only contact you if something comes back abnormal or we need to make some changes. Otherwise no news is good news!  Your physician recommends that you schedule a follow-up appointment in: 1 week for labs and 4 weeks   in the Advanced Practitioners (PA/NP) Clinic  Do the following things EVERYDAY: 1) Weigh yourself in the morning before breakfast. Write it down and keep it in a log. 2) Take your medicines as prescribed 3) Eat low salt foods--Limit salt (sodium) to 2000 mg per day.  4) Stay as active as you can everyday 5) Limit all fluids for the day to less than 2 liters  At the [facility], you and your health needs are our priority. As part of our continuing mission to provide you with exceptional heart care, we have created designated Provider Care Teams. These Care Teams include your primary Cardiologist (physician) and Advanced Practice Providers (APPs- Physician Assistants and Nurse Practitioners) who all work together to provide you with the care you need, when you need it.   You may see any of the following providers on your designated Care Team at your next follow up: Marland Kitchen Dr [name] . Dr [name] . Dr Vickki Muff . Darrick Grinder, NP . Lyda Jester, [place] . Audry Riles, PharmD   Please be sure to bring in all your medications bottles to every appointment.

## 2021-03-21 NOTE — Progress Notes (Signed)
ReDS Vest / Clip - 03/21/21 1500      ReDS Vest / Clip   Station Marker D    Ruler Value 41    ReDS Value Range High volume overload    ReDS Actual Value 42    Anatomical Comments sitting

## 2021-03-29 ENCOUNTER — Other Ambulatory Visit (HOSPITAL_COMMUNITY): Payer: Medicare Other

## 2021-03-29 ENCOUNTER — Other Ambulatory Visit: Payer: Self-pay

## 2021-03-29 ENCOUNTER — Ambulatory Visit (HOSPITAL_COMMUNITY)
Admission: RE | Admit: 2021-03-29 | Discharge: 2021-03-29 | Disposition: A | Discharge status: Home / Self Care | Payer: Medicare Other | Source: Ambulatory Visit | Attending: Cardiology | Admitting: Cardiology

## 2021-03-29 DIAGNOSIS — I5042 Chronic combined systolic (congestive) and diastolic (congestive) heart failure: Secondary | ICD-10-CM | POA: Insufficient documentation

## 2021-03-29 LAB — BASIC METABOLIC PANEL
Anion gap: 8 (ref 5–15)
BUN: 25 mg/dL — ABNORMAL HIGH (ref 8–23)
CO2: 30 mmol/L (ref 22–32)
Calcium: 8.4 mg/dL — ABNORMAL LOW (ref 8.9–10.3)
Chloride: 105 mmol/L (ref 98–111)
Creatinine, Ser: 1.83 mg/dL — ABNORMAL HIGH (ref 0.61–1.24)
GFR, Estimated: 38 mL/min — ABNORMAL LOW (ref >60)
Glucose, Bld: 110 mg/dL — ABNORMAL HIGH (ref 70–99)
Potassium: 3.8 mmol/L (ref 3.5–5.1)
Sodium: 143 mmol/L (ref 135–145)

## 2021-04-02 ENCOUNTER — Other Ambulatory Visit: Payer: Self-pay | Admitting: Cardiology

## 2021-04-02 DIAGNOSIS — I2510 Atherosclerotic heart disease of native coronary artery without angina pectoris: Secondary | ICD-10-CM

## 2021-04-05 ENCOUNTER — Other Ambulatory Visit (HOSPITAL_COMMUNITY): Payer: Self-pay

## 2021-04-05 ENCOUNTER — Telehealth (HOSPITAL_COMMUNITY): Payer: Self-pay | Admitting: Pharmacy Technician

## 2021-04-05 NOTE — Telephone Encounter (Signed)
I received a message from the patient's wife regarding his Entresto co-pay. The patient has hit the donut hole, causing the co-pays to become unaffordable.  Started an application for [employer] assistance, will be at the check in desk for the patient to sign at 5/10 appointment, per the patient's wife.

## 2021-04-09 ENCOUNTER — Other Ambulatory Visit (HOSPITAL_COMMUNITY): Payer: Self-pay | Admitting: Cardiology

## 2021-04-09 ENCOUNTER — Other Ambulatory Visit: Payer: Self-pay | Admitting: Urology

## 2021-04-14 ENCOUNTER — Other Ambulatory Visit (HOSPITAL_COMMUNITY): Payer: Self-pay | Admitting: Cardiology

## 2021-04-18 ENCOUNTER — Other Ambulatory Visit: Payer: Self-pay | Admitting: Cardiology

## 2021-04-18 DIAGNOSIS — I2510 Atherosclerotic heart disease of native coronary artery without angina pectoris: Secondary | ICD-10-CM

## 2021-04-19 ENCOUNTER — Other Ambulatory Visit: Payer: Self-pay

## 2021-04-19 ENCOUNTER — Ambulatory Visit (HOSPITAL_COMMUNITY)
Admission: RE | Admit: 2021-04-19 | Discharge: 2021-04-19 | Disposition: A | Discharge status: Home / Self Care | Payer: Medicare Other | Source: Ambulatory Visit | Attending: Cardiology | Admitting: Cardiology

## 2021-04-19 ENCOUNTER — Encounter (HOSPITAL_COMMUNITY): Payer: Self-pay

## 2021-04-19 VITALS — BP 102/56 | HR 69 | Wt 286.0 lb

## 2021-04-19 DIAGNOSIS — Z6841 Body Mass Index (BMI) 40.0 and over, adult: Secondary | ICD-10-CM | POA: Diagnosis not present

## 2021-04-19 DIAGNOSIS — I480 Paroxysmal atrial fibrillation: Secondary | ICD-10-CM | POA: Insufficient documentation

## 2021-04-19 DIAGNOSIS — J449 Chronic obstructive pulmonary disease, unspecified: Secondary | ICD-10-CM | POA: Insufficient documentation

## 2021-04-19 DIAGNOSIS — Z801 Family history of malignant neoplasm of trachea, bronchus and lung: Secondary | ICD-10-CM | POA: Insufficient documentation

## 2021-04-19 DIAGNOSIS — Z8249 Family history of ischemic heart disease and other diseases of the circulatory system: Secondary | ICD-10-CM | POA: Insufficient documentation

## 2021-04-19 DIAGNOSIS — E785 Hyperlipidemia, unspecified: Secondary | ICD-10-CM | POA: Insufficient documentation

## 2021-04-19 DIAGNOSIS — I493 Ventricular premature depolarization: Secondary | ICD-10-CM | POA: Diagnosis not present

## 2021-04-19 DIAGNOSIS — I6529 Occlusion and stenosis of unspecified carotid artery: Secondary | ICD-10-CM | POA: Insufficient documentation

## 2021-04-19 DIAGNOSIS — I739 Peripheral vascular disease, unspecified: Secondary | ICD-10-CM | POA: Insufficient documentation

## 2021-04-19 DIAGNOSIS — I5042 Chronic combined systolic (congestive) and diastolic (congestive) heart failure: Secondary | ICD-10-CM

## 2021-04-19 DIAGNOSIS — Z7901 Long term (current) use of anticoagulants: Secondary | ICD-10-CM | POA: Diagnosis not present

## 2021-04-19 DIAGNOSIS — I714 Abdominal aortic aneurysm, without rupture: Secondary | ICD-10-CM | POA: Insufficient documentation

## 2021-04-19 DIAGNOSIS — G4733 Obstructive sleep apnea (adult) (pediatric): Secondary | ICD-10-CM | POA: Diagnosis not present

## 2021-04-19 DIAGNOSIS — I255 Ischemic cardiomyopathy: Secondary | ICD-10-CM | POA: Insufficient documentation

## 2021-04-19 DIAGNOSIS — I5022 Chronic systolic (congestive) heart failure: Secondary | ICD-10-CM | POA: Insufficient documentation

## 2021-04-19 DIAGNOSIS — R0602 Shortness of breath: Secondary | ICD-10-CM | POA: Insufficient documentation

## 2021-04-19 DIAGNOSIS — Z923 Personal history of irradiation: Secondary | ICD-10-CM | POA: Diagnosis not present

## 2021-04-19 DIAGNOSIS — Z7984 Long term (current) use of oral hypoglycemic drugs: Secondary | ICD-10-CM | POA: Insufficient documentation

## 2021-04-19 DIAGNOSIS — Z87891 Personal history of nicotine dependence: Secondary | ICD-10-CM | POA: Insufficient documentation

## 2021-04-19 DIAGNOSIS — I2510 Atherosclerotic heart disease of native coronary artery without angina pectoris: Secondary | ICD-10-CM | POA: Diagnosis not present

## 2021-04-19 DIAGNOSIS — Z951 Presence of aortocoronary bypass graft: Secondary | ICD-10-CM | POA: Insufficient documentation

## 2021-04-19 DIAGNOSIS — Z95810 Presence of automatic (implantable) cardiac defibrillator: Secondary | ICD-10-CM | POA: Insufficient documentation

## 2021-04-19 DIAGNOSIS — D649 Anemia, unspecified: Secondary | ICD-10-CM | POA: Insufficient documentation

## 2021-04-19 DIAGNOSIS — Z79899 Other long term (current) drug therapy: Secondary | ICD-10-CM | POA: Insufficient documentation

## 2021-04-19 DIAGNOSIS — R251 Tremor, unspecified: Secondary | ICD-10-CM | POA: Insufficient documentation

## 2021-04-19 LAB — BASIC METABOLIC PANEL
Anion gap: 8 (ref 5–15)
BUN: 22 mg/dL (ref 8–23)
CO2: 30 mmol/L (ref 22–32)
Calcium: 8.7 mg/dL — ABNORMAL LOW (ref 8.9–10.3)
Chloride: 105 mmol/L (ref 98–111)
Creatinine, Ser: 1.33 mg/dL — ABNORMAL HIGH (ref 0.61–1.24)
GFR, Estimated: 55 mL/min — ABNORMAL LOW (ref >60)
Glucose, Bld: 125 mg/dL — ABNORMAL HIGH (ref 70–99)
Potassium: 3.6 mmol/L (ref 3.5–5.1)
Sodium: 143 mmol/L (ref 135–145)

## 2021-04-19 LAB — CBC
HCT: 43.5 % (ref 39.0–52.0)
Hemoglobin: 13.6 g/dL (ref 13.0–17.0)
MCH: 31.7 pg (ref 26.0–34.0)
MCHC: 31.3 g/dL (ref 30.0–36.0)
MCV: 101.4 fL — ABNORMAL HIGH (ref 80.0–100.0)
Platelets: 130 10*3/uL — ABNORMAL LOW (ref 150–400)
RBC: 4.29 MIL/uL (ref 4.22–5.81)
RDW: 14.5 % (ref 11.5–15.5)
WBC: 5.7 10*3/uL (ref 4.0–10.5)
nRBC: 0 % (ref 0.0–0.2)

## 2021-04-19 LAB — IRON AND TIBC
Iron: 59 ug/dL (ref 45–182)
Saturation Ratios: 19 % (ref 17.9–39.5)
TIBC: 315 ug/dL (ref 250–450)
UIBC: 256 ug/dL

## 2021-04-19 LAB — BRAIN NATRIURETIC PEPTIDE: B Natriuretic Peptide: 274.7 pg/mL — ABNORMAL HIGH (ref 0.0–100.0)

## 2021-04-19 LAB — FERRITIN: Ferritin: 47 ng/mL (ref 24–336)

## 2021-04-19 MED ORDER — TORSEMIDE 20 MG PO TABS: ORAL_TABLET | ORAL | 11 refills | Status: AC

## 2021-04-19 NOTE — Progress Notes (Signed)
ID:  [name], DOB 09-13-1946, MRN [id number]   Provider location: [facility] Type of Visit: Established patient   PCP:  [name], MD  Cardiologist:  [name]  Reason for visit: f/u for chronic systolic heart failure    History of Present Illness: [name] is a 75 y.o. male who has a past medical history significant for morbid obesity, systolic heart failure with an EF of 30-35% (January 2014) -> 20-25% (March 2015 - RV normal), VT on amio, CAD s/p CABG 1996, LBBB, COPD, obstructive sleep apnea on CPAP, AAA repair (January 2015).   Admitted to [facility] July 24 through July 06 2014 with chest pain.  CEs negative. Had a LHC with no change from previous LHC with recommendations to continue medical management. Discharge weight was 255 pounds.   LHC 07/06/14 --No significant change from previous studies.  Left anterior descending (LAD): The LAD is a large vessel. There is a 40% stenosis immediately after the takeoff of a large septal perforator. There are 2 large diagonal branches without significant disease. Left circumflex (LCx): The LCx is occluded proximally. There are left to left collaterals.  Right coronary artery (RCA): The RCA is occluded proximally immediately following the conus branch. There are right to right and left to right collaterals. SVGs from CABG known to be totally occluded.   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   CTA chest/abd/pelvis (3/16) with moderate emphysema, 1.3 cm nodule RUL, left SFA totally occluded, right SFA with significant stenosis, s/p endovascular AAA repair (stable).   Echo (5/17) with EF 20-25%, severe LV dilation, mild MR, normal RV size with mildly decreased systolic function.   He has a RUL nodule that is most likely lung cancer, he is being treated with radiation.   Echo (8/18) with EF 30%, basal-mid inferior and inferolateral AK, severe LV dilation, normal RV, mild MR.   RHC/LHC was  done in 1/19 due to worsening exertional dyspnea.  This showed normal filling pressures and preserved cardiac output.  There was 30-40% mLAD stenosis.  The LAD provided collaterals to the LCx and RCA territories.  The LCx, RCA, SVG-OM, and SVG-PDA were all chronically occluded (known from the past).  No new disease.   [facility] in [facility]/19 at [facility] with 0.78 on right, 0.59 on left => left iliac > 50% stenosis.  11/19 ABIs 0.78 right, 0.59 left.  3/20 ABIs 0.74 right, 0.66 left.   He saw neurology about his tremor.  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  However, he is on amiodarone which may be contributing.  He has been seeing Dr. Vertell Limber with plan for deep brain stimulator, on hold currently due to cancer treatment.   He had chest radiation for RLL lung cancer.  However, CT showed new RUL lesion in 11/19 and he restart XRT in 12/19, repeat XRT is now completed.   Followup CT chest showed a lesion in the left upper lobe.  He had XRT again, finishing in 9/20.   Echo in 7/20 showed EF 35-40%, basal to mid inferolateral and inferior akinesis, mildly decreased RV systolic function, mild AS, PASP 22 mmHg.    Patient went into atrial fibrillation in 11/21 and was cardioverted back to NSR in 12/21.  Echo in 1/22 showed EF 40-45% with inferolateral and basal inferior akinesis, mild MR, mild RV dilation with mildly decreased RV systolic function, normal IVC.   Seen by [name] on 02/15/21. He endorsed  increased dyspnea and cough. He was felt to have mid COPD flare and also midly fluid overloaded on exam. He was prescribed prednisone 20 mg daily x 5 days and doxycycline 100 mg bid x 7 days.  He was instructed to increase torsemide to 40 mg bid x 5 days then decrease back to 40 qam/20 qpm. He has also reported  That he had lost vision in his right lower visual field for about 5 minutes a few days prior w/ complete resolution.  This happened once before for about 15 minutes around a year ago.  No other neurological  symptoms. [name] ordered carotid dopplers. Study was completed 3/18 and showed totally occluded RICA. The left w/ only minimal disease. Repeat study in 1 year was recommended. He was also referred to opthalmology.   Had f/u on 4/11 and wt was actually up 8 lb from previous visit. ReDs clip elevated at 42%. Was still SOB w/ exertion. Reports full med compliance. Device interrogated. No Afib. Torsemide was increased to 40 mg bid.   He returns back today for f/u. Here w/ wife. Wt continues to trend up, up 7 more lb at 286 lb today. Still SOB w/ physical activity, NYHA Class II-III. Reports full compliance w/ torsemide and reports good UOP but admits to dietary indiscretion w/ sodium. Unable to get ReDs Clip reading, reading low quality. Denies resting dyspnea. BP 102/56. HR 70s.   ECG (personally reviewed): V-paced rhythm, 70 bpm   Labs 3/15 Cr 1.5 K 5.6 Labs 07/06/14 K 3.9 Creatinine  0.98 Labs 9/15 K 5.1, creatinine 0.96 Labs 11/15 K 3.8, creatinine 0.83, LDL 61, LFTs normal, TSH normal Labs 11/26/14 K 5.0 Creatinine 0.73 , LFTs normal, TSH normal,  Labs 12/08/14 K 3.4 Creatinine 0.83  Labs 3/16 K 4.4, creatinine 1.03, LFTs normal, TSH normal, HCT 37.8, BNP 65 Labs 6/16 K 4.1, creatinine 0.93, TSH normal, LFTs normal Labs 11/16 K 5.1, creatinine 1.13, LFTs normal, TSH normal, LDL 83, HDL 33, TGs 162 Labs 1/17 K 4.4, creatinine 0.86, pro-BNP 154 Labs 2/17 BNP 113, K 3.8, creatinine 0.79, TSH normal, LFTs normal Labs 4/17 K 3.8, creatinine 0.91, HCT 48.1 Labs 6/17 K 3.5, creatinine 0.98 Labs 9/17 K 3.9, creatinine 0.79, LDL 69, HDL 29, TSH normal, LFTs normal Labs 3/18 K 3.6, creatinine 0.87 Labs 8/18 K 3.3 => 4.2, creatinine 0.78 => 1.04, LDL 66, HDL 38, BNP 166 Labs 9/18: K 4.7, creatinine 0.9, BNP 144 Labs 11/18: K 4.8, creatinine 1.05, TSH normal, LFTs normal.  Labs 1/19: K 4.4, creatinine 1.08, LFTs normal Labs 5/19: LDL 60, HDL 28, TSH normal, LFTs normal, K 4.6, creatinine  1.43 Labs 11/19: K 4.6, creatinine 1.42, LFTs normal, TSH normal, Mg 2.3 Labs 1/20: TSH normal, K 4.2, creatinine 1.12, LFTs normal Labs 4/20: LDL 42, HDL 28, TGs 234 Labs 5/20: K 4.8 => 4.4, creatinine 1.95 => 1.38, TSH normal, LFTs normal Labs 7/20: K 4.1, creatinine 1.22 Labs 9/20: LDL 36, HDL 29 Labs 3/21: K 4.5, creatinine 1.76, hgb 11.8 Labs 4/21: K 4.4, creatinine 1.63, hgb 12.8, TSH 4.67 (elevated) Labs 8/21: K 4.5, creatinine 2.37 => 1.66 => 1.45, hgb 12.9, LDL 42 Labs 12/21: K 4, creatinine 1.3 Labs 1/22: TSH normal, LFTs normal Labs 2/22: K 4.4, creatinine 1.54 Labs 3/22: K 5.6, creatinine 1.58 Labs 4/22: K 4.2, creatinine 1.45     Past Medical History:  Diagnosis Date  . AAA (abdominal aortic aneurysm) ([place])    followed by Dr. Bridgett Larsson  .  Adenomatous colon polyp 01/2004  . Anemia    hx  . Automatic implantable cardioverter-defibrillator in situ   . AVM (arteriovenous malformation)   . BPH (benign prostatic hypertrophy)   . CAD (coronary artery disease)    s/p CABGx2 in 1996  . Carotid artery stenosis    LCEA - Dr. Bridgett Larsson in 2013  . CHF (congestive heart failure) ([place])   . Complication of anesthesia    claustrophobic, unabe to lie on back more than 4 hours at time due to back  . COPD (chronic obstructive pulmonary disease) ([place])   . Diabetes mellitus    DIET CONTROLLED- pt states that this was a misdiagnosis, he was treated while in the hospital  but returned home, loss a massive amount of weight and he has not had a problem with his blood sugar since. States everything has been normal for about 3 years.  . Diverticulosis   . Dyspnea   . Dysrhythmia    ICD-defibrillator  . Fatigue   . GERD (gastroesophageal reflux disease)   . H/O hiatal hernia   . History of radiation therapy 04/09/17-04/17/17   SBRT right lung 54 Gy in 3 fractions  . HLD (hyperlipidemia)   . Hypertension   . Hypothyroidism   . Ischemic cardiomyopathy   . Myocardial infarction ([place])   . NSCL ca  2018   recurrence x 3  . OSA on CPAP    AHI durign total sleep 14.69/hr, during REM 50.91/hr  . Peripheral vascular disease ([facility])    LCEA, L renal artery stent, bilat iliac stents, R SFA stenosis  . Tremor   . Ventricular tachycardia ([place]) 09/09/2014   Amiodarone was started after appropriate defibrillator shocks for ventricular tachycardia in October 2008   Past Surgical History:  Procedure Laterality Date  . ABDOMINAL AORTIC ENDOVASCULAR STENT GRAFT [name] 01/06/2014   Procedure: ABDOMINAL AORTIC ENDOVASCULAR STENT GRAFT- GORE; ULTRASOUND GUIDED;  Surgeon: Conrad Richardton, MD;  Location: [facility];  Service: Vascular;  Laterality: [name];  . ANGIOPLASTY     BILATERAL  LE  W/STENTS  . BI-VENTRICULAR IMPLANTABLE CARDIOVERTER DEFIBRILLATOR UPGRADE [name] 10/09/2014   Procedure: BI-VENTRICULAR IMPLANTABLE CARDIOVERTER DEFIBRILLATOR UPGRADE;  Surgeon: Evans Lance, MD;  Location: [facility] CATH LAB;  Service: Cardiovascular;  Laterality: [name];  . BIV ICD GENERTAOR CHANGE OUT  10/09/2014   UPGRADE TO BIV        BY DR Lovena Le  . 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 Adora Fridge)  . CARDIAC CATHETERIZATION  10/03/2002   SVG sequentially to OM1 & OM2 totally occluded at ostium, SVG to PDA totally occluded within previously placed prox vein graft stent, smal distal AAA, bialt iliac stents with 30% left end-stent restenosis and 50% right end-stent restnosis(Dr. Gerrie Nordmann)  . CARDIAC CATHETERIZATION  06/11/1998   L main with 20% narrowing in distal 1/3; LAD with 1st diagonla having 70% ostial narrowing, 2nd diagonal with 40% narrowing in prox third, LIMA & RIMA widely patent; in-stent restenosis of RCIA with successful PTA and new prox SVTRCA stent for residual disease (Dr. Marella Chimes)  . CARDIAC DEFIBRILLATOR PLACEMENT  06/2005    Guidant Vitality HE - ischemic cardiomyopathy - Dr. Marella Chimes  . CARDIOVERSION [name] 11/19/2020   Procedure: CARDIOVERSION;  Surgeon: Larey Dresser, MD;  Location: [facility] ENDOSCOPY;  Service: Cardiovascular;  Laterality: [name];  . CAROTID ENDARTERECTOMY Left 01/04/12  . CORONARY ANGIOPLASTY  01/08/2004   cutting balloon atherectomy & percutaneous intervention of RCIA in-stent restenosis (Dr. Marella Chimes)  . CORONARY ARTERY BYPASS GRAFT  11/04/1985   x2 - PDA and sequential DX-OM (Dr. Redmond Pulling)  . ENDARTERECTOMY  01/04/2012   Procedure: ENDARTERECTOMY CAROTID;left  Surgeon: Hinda Lenis, MD;  Location: [facility];  Service: Vascular;  Laterality: Left;  with patch angioplasty  . FUDUCIAL PLACEMENT [name] 02/23/2017   Procedure: PLACEMENT OF FUDUCIAL;  Surgeon: [name], MD;  Location: [facility];  Service: Thoracic;  Laterality: [name];  . ICD GENERATOR CHANGE  05/02/2010   [employer], [employer]  . ILIAC ARTERY STENT Bilateral 03/1997   and L SFA PTA  . Iron infusion  June 16, 2012  . LEFT HEART CATHETERIZATION WITH CORONARY ANGIOGRAM [name] 07/06/2014   Procedure: LEFT HEART CATHETERIZATION WITH CORONARY ANGIOGRAM;  Surgeon: Peter M Martinique, MD;  Location: [facility] CATH LAB;  Service: Cardiovascular;  Laterality: [name];  . NM MYOCAR PERF WALL MOTION  09/2012   lexiscan myoview - mod-severe perfusion defect r/t infarct or scar w/mild periinfarct ishcemia in basal inferior, mid inferior, apical inferior, basal inferolateral & mid inferoalteral regions - EF 21% low risk scan  . POLYPECTOMY    . RENAL ARTERY STENT Left 03/24/2004   6x65mm Genesis stent (Dr. Marella Chimes)  . RIGHT/LEFT HEART CATH AND CORONARY ANGIOGRAPHY [name] 12/28/2017   Procedure: RIGHT/LEFT HEART CATH AND CORONARY ANGIOGRAPHY;  Surgeon: Larey Dresser, MD;  Location: [facility] CV LAB;  Service: Cardiovascular;  Laterality: [name];  . TRANSTHORACIC ECHOCARDIOGRAM  12/2012   EF 30-35; LV mod to severely dilated, mod concentric hypertrophy,  severe hypokinesis of inferolateral myocardium, moderate hypokineis of anteroseptal region, grade 1 diastolic dysfunction; mod MR; LA mod-severely dialted; RV mod dialted; RA mildly dilated; PA peak pressure 68mmHg  . VIDEO BRONCHOSCOPY WITH ENDOBRONCHIAL NAVIGATION [name] 02/23/2017   Procedure: VIDEO BRONCHOSCOPY WITH ENDOBRONCHIAL NAVIGATION;  Surgeon: [name], MD;  Location: [facility];  Service: Thoracic;  Laterality: [name];     Current Outpatient Medications  Medication Sig Dispense Refill  . albuterol (PROVENTIL HFA;VENTOLIN HFA) 108 (90 BASE) MCG/ACT inhaler Inhale 2 puffs into the lungs every 6 (six) hours as needed for wheezing or shortness of breath. 3 Inhaler 4  . albuterol (PROVENTIL) (2.5 MG/3ML) 0.083% nebulizer solution USE 1 VIAL IN NEBULIZER 4 TIMES DAILY 120 mL 11  . amiodarone (PACERONE) 200 MG tablet Take 1 tablet (200 mg total) by mouth daily. 45 tablet 3  . atorvastatin (LIPITOR) 80 MG tablet TAKE 1 TABLET BY MOUTH EVERY DAY 30 tablet 1  . ATROVENT HFA 17 MCG/ACT inhaler Inhale 2 puffs into the lungs every 4 (four) hours as needed for wheezing.   12  . carvedilol (COREG) 25 MG tablet TAKE 1 TABLET BY MOUTH  TWICE DAILY WITH A MEAL 180 tablet 3  . dapagliflozin propanediol (FARXIGA) 10 MG TABS tablet Take 1 tablet (10 mg total) by mouth daily before breakfast. 30 tablet 11  . diazepam (VALIUM) 5 MG tablet Take 1 tablet (5 mg total) by mouth every 8 (eight) hours as needed for anxiety. 1-2 tabs every 8 hours if needed for tremor 30 tablet 5  . ezetimibe (ZETIA) 10 MG tablet TAKE 1 TABLET BY MOUTH  DAILY 90 tablet 3  . finasteride (PROSCAR) 5 MG tablet TAKE 1 TABLET BY MOUTH  DAILY 90 tablet 3  . fluticasone (FLONASE) 50 MCG/ACT nasal spray  Instill 1 spray in each  nostril daily 32 g 3  . Fluticasone-Umeclidin-Vilant (TRELEGY ELLIPTA) 100-62.5-25 MCG/INH AEPB Inhale 1 puff into the lungs daily. Rinse mouth 60 each 12  . icosapent Ethyl (VASCEPA) 1 g capsule TAKE 2 CAPSULES  (2 G TOTAL) BY MOUTH 2 (TWO) TIMES A DAY. 120 capsule 11  . isosorbide mononitrate (IMDUR) 60 MG 24 hr tablet TAKE 1.5 TABLETS (90 MG TOTAL) BY MOUTH DAILY. 90 tablet 1  . levothyroxine (SYNTHROID) 50 MCG tablet TAKE 1 TABLET BY MOUTH  DAILY BEFORE BREAKFAST 90 tablet 1  . Multiple Vitamins-Minerals (MULTIVITAMIN WITH MINERALS) tablet Take 1 tablet by mouth daily.    . Multiple Vitamins-Minerals (PRESERVISION AREDS 2 PO) Take 1 capsule by mouth at bedtime.    . nitroGLYCERIN (NITROSTAT) 0.4 MG SL tablet PLACE 1 TABLET UNDER THE TONGUE EVERY 5 MINUTES AS NEEDED FOR CHEST PAIN. 25 tablet 3  . NON FORMULARY at bedtime. CPAP And during the day as needed    . potassium chloride SA (KLOR-CON M20) 20 MEQ tablet Take 1 tablet (20 mEq total) by mouth daily. 90 tablet 3  . Rivaroxaban (XARELTO) 15 MG TABS tablet Take 1 tablet (15 mg total) by mouth daily with supper. 30 tablet 11  . sacubitril-valsartan (ENTRESTO) 49-51 MG Take 1 tablet by mouth 2 (two) times daily. 180 tablet 3  . spironolactone (ALDACTONE) 25 MG tablet TAKE 1 TABLET BY MOUTH  DAILY 90 tablet 3  . torsemide (DEMADEX) 20 MG tablet Take 2 tablets (40 mg total) by mouth 2 (two) times daily. 120 tablet 11  . traMADol (ULTRAM) 50 MG tablet Take 1 tablet (50 mg total) by mouth every 6 (six) hours as needed. 75 tablet 3   No current facility-administered medications for this encounter.    Allergies:   Tussionex pennkinetic er [hydrocod polst-cpm polst er] and Ace inhibitors   Social History:  The patient  reports that he quit smoking about 18 years ago. His smoking use included cigarettes and pipe. He has a 60.00 pack-year smoking history. He has never used smokeless tobacco. He reports that he does not drink alcohol and does not use drugs.   Family History:  The patient's family history includes Cancer in his mother and sister; Colon cancer in his sister; Diabetes in his daughter, mother, and sister; Heart attack in his father and maternal  grandmother; Heart disease in his father, mother, and sister; Hyperlipidemia in his mother and sister; Hypertension in his father, mother, and sister; Lung cancer in his mother; Parkinson's disease in his sister.   ROS:  Please see the history of present illness.   All other systems are personally reviewed and negative.   Exam:   BP (!) 102/56   Pulse 69   Wt 129.7 kg (286 lb)   SpO2 93%   BMI 43.49 kg/m  PHYSICAL EXAM: General:  Well appearing, obese. No respiratory difficulty HEENT: normal Neck: supple. JVD 10 cm Carotids 2+ bilat; no bruits. No lymphadenopathy or thyromegaly appreciated. Cor: PMI nondisplaced. Regular rate & rhythm. No rubs, gallops or murmurs. Lungs: clear Abdomen: obese, soft, nontender, nondistended. No hepatosplenomegaly. No bruits or masses. Good bowel sounds. Extremities: no cyanosis, clubbing, rash, 1+ bilateral pretibial/ ankle edema Neuro: alert & oriented x 3, cranial nerves grossly intact. moves all 4 extremities w/o difficulty. Affect pleasant.    Recent Labs: 12/24/2020: TSH 3.971 02/15/2021: ALT 16; Hemoglobin 14.0; Magnesium 2.4; Platelets 198 03/29/2021: BUN 25; Creatinine, Ser 1.83; Potassium 3.8; Sodium 143  Personally  reviewed   Wt Readings from Last 3 Encounters:  04/19/21 129.7 kg (286 lb)  03/21/21 126.9 kg (279 lb 12.8 oz)  02/15/21 123.1 kg (271 lb 6.4 oz)      ASSESSMENT AND PLAN:  1. Chronic systolic CHF: Ischemic cardiomyopathy. Echo in 8/18 showed EF 30% with severe LV dilation and normal RV.  [employer] CRT-D.  RHC in 1/19 showed normal filling pressures and preserved cardiac output.  Echo in 7/20 showed EF 35-40% with mildly decreased RV systolic function.  Echo in 1/22 showed EF 40-45%, mildly decreased RV systolic function.  NYHA class III symptoms. Remains fluid overloaded on exam despite recent diuretic increase. Unable to get ReDs Clip reading (low quality)  - Increase torsemide to 60 qam/ 40 qpm x 2 days then back to 40  mg bid - Reduce coreg to 12.5 mg bid to allow BP room for Entresto titration.  - Increase Entresto to 97-103 mg bid.      - Continue spironolactone 25 mg daily.  - Continue dapagliflozin 10 mg daily.  - Check BMP today and again in 7 days - If he continues w/ difficulties diuresing, will discuss RHC at next visit - discussed fluid restriction and low sodium diet  2. CAD s/p CABG: LHC 1/19 with stable anatomy (LAD is patent, other vessels occluded with collaterals from the LAD).  He has had angina with atrial fibrillation/RVR runs, however this seems to be better on Imdur.  He denies recent chest pain.   - Continue atorvastatin, good lipids in 8/21.   - He is not on ASA due to Xarelto use.   - Continue Imdur 60 mg daily.  3. VT s/p ICD: On amiodarone 200 mg daily for history of ICD discharges due to VT.  LFTs and TSH normal recently, he will need a regular eye exam. He has been referred to opthalmology.  4. Morbid obesity: Continue to work on diet/exercise.  5. COPD: Moderate to severe.  Historically, a lot of his dyspnea has seemed to be due to COPD. Recently treated w/ course of prednisone and doxy. No further wheezing or cough.  - Continue home oxygen during the day with CPAP at night.  - Continue his baseline inhalers. - f/u w/ pulmonology  6. Lung cancer: He completed radiation.  7. PAD: Occluded left SFA and significant right SFA stenosis on last CTA.  ABIs in 3/20 with significant disease bilaterally but stable.  Does not get leg pain with exertion but has nocturnal cramps.  [facility], medical management for now.  He is on statin, good lipids in 8/21.  8. AAA: s/p repair.  Followed at [facility].  No endoleak on last evaluation.  9. Tremor: Most likely essential tremor given family history.  However, cannot rule out role for amiodarone. As above, I think that he needs to continue at least a low dose of amiodarone.  Dr. Carles Collet with neurology discussed deep brain stimulation => this is on hold  since he had a diagnosis of lung cancer.   10. Hyperlipidemia: He is on Vascepa and atorvastatin.  - Good lipids in 8/21.  11. PVCs: Frequent PVCs noted in the past, decreased with higher BiV pacing base rate.  - Continue amiodarone to suppress PVCs.  - Continue Coreg, reduce to 12.5 mg bid per above  12. Atrial fibrillation: Paroxysmal, s/p DCCV in 12/21.  Regular rhythm today. V paced on EKG  - Continue Coreg, 12.5 mg bid - Continue amiodarone 200 mg daily.  -  Continue Xarelto. Denies abnormal bleeding  13. OSA: Using CPAP.  14. Amaurosis fugax: 8/21, loss of vision transiently in right eye. Possibly related to atrial fibrillation though he has been on Xarelto without missing doses. Had similar episode 3/22, lasting ~15 min.   He has not missed Xarelto. Carotid dopplers 3/22 showed totally occluded RICA, left w/ minimal disease. He denies any further symptoms and has been referred to opthalmology.  - Continue Xarelto.  15. Carotid stenosis: Most recent carotid dopplers in 3/22 with occluded RICA (chronic), minimal LICA disease.  - Repeat carotid dopplers in 1 year 16. H/o anemia: has required IV Fe in past. ? If contributing to exertional dyspnea.  - check CBC and iron panel   F/u BMP in 1 week. Provider f/u in 4 weeks   Signed, Lyda Jester, PA-C  04/19/2021  [place] [address] [phone] (office) [phone] (fax)

## 2021-04-19 NOTE — Patient Instructions (Addendum)
INCREASE Torsemide to 60 mg in the AM and 40 mg in the PM  Labs today We will only contact you if something comes back abnormal or we need to make some changes. Otherwise no news is good news!  Do the following things EVERYDAY: 1) Weigh yourself in the morning before breakfast. Write it down and keep it in a log. 2) Take your medicines as prescribed 3) Eat low salt foods--Limit salt (sodium) to 2000 mg per day.  4) Stay as active as you can everyday 5) Limit all fluids for the day to less than 2 liters At the [facility], you and your health needs are our priority. As part of our continuing mission to provide you with exceptional heart care, we have created designated Provider Care Teams. These Care Teams include your primary Cardiologist (physician) and Advanced Practice Providers (APPs- Physician Assistants and Nurse Practitioners) who all work together to provide you with the care you need, when you need it.   You may see any of the following providers on your designated Care Team at your next follow up: Marland Kitchen Dr [name] . Dr [name] . Dr Vickki Muff . Darrick Grinder, NP . Lyda Jester, [place] . Audry Riles, PharmD   Please be sure to bring in all your medications bottles to every appointment.   If you have any questions or concerns before your next appointment please send [place] a message through [place] or call our office at [phone].    TO LEAVE A MESSAGE FOR THE NURSE SELECT OPTION 2, PLEASE LEAVE A MESSAGE INCLUDING: . YOUR NAME . DATE OF BIRTH . CALL BACK NUMBER . REASON FOR CALL**this is important as we prioritize the call backs  YOU WILL RECEIVE A CALL BACK THE SAME DAY AS LONG AS YOU CALL BEFORE 4:00 PM

## 2021-04-20 ENCOUNTER — Telehealth (HOSPITAL_COMMUNITY): Payer: Self-pay | Admitting: Pharmacy Technician

## 2021-04-20 NOTE — Telephone Encounter (Signed)
Sent in [employer] application via fax.  Will follow up.

## 2021-04-21 ENCOUNTER — Telehealth (HOSPITAL_COMMUNITY): Payer: Self-pay | Admitting: Cardiology

## 2021-04-21 ENCOUNTER — Encounter (HOSPITAL_COMMUNITY): Payer: Self-pay | Admitting: Cardiology

## 2021-04-21 MED ORDER — CARVEDILOL 12.5 MG PO TABS: 12.5000 mg | ORAL_TABLET | Freq: Two times a day (BID) | ORAL | 11 refills | Status: AC

## 2021-04-21 MED ORDER — ENTRESTO 97-103 MG PO TABS: 1.0000 | ORAL_TABLET | Freq: Two times a day (BID) | ORAL | 3 refills | Status: AC

## 2021-04-21 NOTE — Telephone Encounter (Signed)
-----   Message from Consuelo Pandy, [place] sent at 04/19/2021  4:34 PM EDT ----- SCr better. Increase torsemide to 60 mg qam/ 40 mg qpm x 2 days, then return to 40 mg bid. Increase Entresto to 97-103 mg bid and reduce Coreg to 12.5 mg bid to allow BP room for diuretic increase. Closely monitor BP at home and call if BP too low of if any dizziness. F/u BMP in 7 days. Keep f/u w/ [name] next month.

## 2021-04-21 NOTE — Telephone Encounter (Signed)
Pt aware via wife Reports understanding Repeat labs 5/17 already scheduled

## 2021-04-22 ENCOUNTER — Telehealth: Payer: Medicare Other

## 2021-04-22 NOTE — Progress Notes (Deleted)
Chronic Care Management Pharmacy Note  04/22/2021 Name:  [name] MRN:  [id number] DOB:  March 15, 1946  Subjective: [name] is an 75 y.o. year old male who is a primary patient of [name], MD.  The CCM team was consulted for assistance with disease management and care coordination needs.    Engaged with patient by telephone for initial visit in response to provider referral for pharmacy case management and/or care coordination services.   Consent to Services:  The patient was given the following information about Chronic Care Management services today, agreed to services, and gave verbal consent: 1. CCM service includes personalized support from designated clinical staff supervised by the primary care provider, including individualized plan of care and coordination with other care providers 2. 24/7 contact phone numbers for assistance for urgent and routine care needs. 3. Service will only be billed when office clinical staff spend 20 minutes or more in a month to coordinate care. 4. Only one practitioner may furnish and bill the service in a calendar month. 5.The patient may stop CCM services at any time (effective at the end of the month) by phone call to the office staff. 6. The patient will be responsible for cost sharing (co-pay) of up to 20% of the service fee (after annual deductible is met). Patient agreed to services and consent obtained.  Patient Care Team: [name], MD as PCP - Dorisann Frames, MD as Attending Physician (Urology) Larey Dresser, MD as Consulting Physician (Cardiology) Charlton Haws, [facility] as Pharmacist (Pharmacist)  Patient lives at home with his wife. She manages his medications for him and sets up pill box each week.  Recent office visits: 07/14/20 Dr Ronnald Ramp OV: chronic f/u, c/o back pain, stop thiamine. Rx'd tramadol  Recent consult visits: 04/19/21 PA Rosita Fire (HF clinic): fluid overload, increased torsemide to 60 qAM/40qpm x  2 days; reduce Coreg to 12.5 mg. Increase Entresto to 97-103 mg BID  12/24/20 Dr Aundra Dubin (HF clinic [facility]): CHF NYHA III symptoms improved after DCCV 11/2020. Increase Entresto to 49/51. Amiodarone may be contributing to tremor but rec'd continue.  12/23/20 Dr Sondra Come (oncology): f/u lung cancer. No clinical evidence of recurrence.   11/30/20 Dr Annamaria Boots (pulmonary): COPD stable, no med changes. Continue O2 2-3 L and CPAP.  11/19/20 admission for cardioversion.  11/12/20 Dr Aundra Dubin (HF clinic): increased torsemide to 60 mg AM and 40 mg PM x 2 days then 40 mg BID. Increased amiodarone to 200 mg.  Hospital visits: None in previous 6 months  Objective:  Lab Results  Component Value Date   CREATININE 1.33 (H) 04/19/2021   BUN 22 04/19/2021   GFR 51.83 (L) 10/15/2018   GFRNONAA 55 (L) 04/19/2021   GFRAA 54 (L) 08/06/2020   NA 143 04/19/2021   K 3.6 04/19/2021   CALCIUM 8.7 (L) 04/19/2021   CO2 30 04/19/2021    Lab Results  Component Value Date/Time   HGBA1C 5.5 07/14/2020 11:29 AM   HGBA1C 6.4 03/23/2020 11:02 AM   GFR 51.83 (L) 10/15/2018 04:36 PM   GFR 91.65 02/20/2017 11:32 AM    Last diabetic Eye exam:  Lab Results  Component Value Date/Time   HMDIABEYEEXA No Retinopathy 05/09/2018 12:00 AM    Last diabetic Foot exam: No results found for: HMDIABFOOTEX   Lab Results  Component Value Date   CHOL 95 07/14/2020   HDL 30 (L) 07/14/2020   LDLCALC 42 07/14/2020   TRIG 151 (H) 07/14/2020   CHOLHDL  3.2 07/14/2020    Hepatic Function Latest Ref Rng & Units 02/15/2021 12/24/2020 08/06/2020  Total Protein 6.5 - 8.1 g/dL 6.6 6.5 6.1(L)  Albumin 3.5 - 5.0 g/dL 3.8 3.9 3.6  AST 15 - 41 U/L 16 18 13(L)  ALT 0 - 44 U/L 16 19 13   Alk Phosphatase 38 - 126 U/L 78 76 65  Total Bilirubin 0.3 - 1.2 mg/dL 0.6 0.8 0.5  Bilirubin, Direct 0.0 - 0.2 mg/dL - - -    Lab Results  Component Value Date/Time   TSH 3.971 12/24/2020 03:44 PM   TSH 3.623 08/06/2020 11:48 AM   TSH 3.34 07/14/2020 11:29  AM   TSH 4.67 (H) 03/23/2020 11:02 AM   FREET4 1.20 04/21/2019 10:01 AM    CBC Latest Ref Rng & Units 04/19/2021 02/15/2021 12/24/2020  WBC 4.0 - 10.5 K/uL 5.7 9.8 6.9  Hemoglobin 13.0 - 17.0 g/dL 13.6 14.0 14.3  Hematocrit 39.0 - 52.0 % 43.5 44.0 44.4  Platelets 150 - 400 K/uL 130(L) 198 152    No results found for: VD25OH  Clinical ASCVD: Yes  The ASCVD Risk score Mikey Bussing DC Jr., et al., 2013) failed to calculate for the following reasons:   The valid total cholesterol range is 130 to 320 mg/dL    Depression screen [facility] 2/9 03/23/2020 11/21/2018 06/10/2018  Decreased Interest 0 0 0  Down, Depressed, Hopeless 0 0 0  PHQ - 2 Score 0 0 0  Altered sleeping 0 - -  Tired, decreased energy 1 - -  Change in appetite 0 - -  Feeling bad or failure about yourself  0 - -  Trouble concentrating 0 - -  Moving slowly or fidgety/restless 0 - -  Suicidal thoughts 0 - -  PHQ-9 Score 1 - -  Difficult doing work/chores Not difficult at all - -  Some recent data might be hidden     CHA2DS2-VASc Score = 5  The patient's score is based upon: CHF History: Yes HTN History: Yes Diabetes History: No Stroke History: No Vascular Disease History: Yes Age Score: 2 Gender Score: 0     Social History   Tobacco Use  Smoking Status Former Smoker  . Packs/day: 1.50  . Years: 40.00  . Pack years: 60.00  . Types: Cigarettes, Pipe  . Quit date: 12/11/2002  . Years since quitting: 18.3  Smokeless Tobacco Never Used  Tobacco Comment   Pt reports smoking 1 pack/day before quitting in 2004   BP Readings from Last 3 Encounters:  04/19/21 (!) 102/56  03/21/21 116/72  02/15/21 138/70   Pulse Readings from Last 3 Encounters:  04/19/21 69  03/21/21 70  02/15/21 80   Wt Readings from Last 3 Encounters:  04/19/21 286 lb (129.7 kg)  03/21/21 279 lb 12.8 oz (126.9 kg)  02/15/21 271 lb 6.4 oz (123.1 kg)    Assessment/Interventions: Review of patient past medical history, allergies, medications, health  status, including review of consultants reports, laboratory and other test data, was performed as part of comprehensive evaluation and provision of chronic care management services.   SDOH:  (Social Determinants of Health) assessments and interventions performed: Yes   CCM Care Plan  Allergies  Allergen Reactions  . Tussionex Pennkinetic Er [Hydrocod Polst-Cpm Polst Er] Other (See Comments)    UNSPECIFIED REACTION  > "caused prostate problems"  . Ace Inhibitors Cough    Medications Reviewed Today    Reviewed by Consuelo Pandy, PA-C (Physician Assistant) on 04/19/21 at 1647  Med  List Status: <None>  Medication Order Taking? Sig Documenting Provider Last Dose Status Informant  albuterol (PROVENTIL HFA;VENTOLIN HFA) 108 (90 BASE) MCG/ACT inhaler [id number] Yes Inhale 2 puffs into the lungs every 6 (six) hours as needed for wheezing or shortness of breath. [name], MD Taking Active Spouse/Significant Other  albuterol (PROVENTIL) (2.5 MG/3ML) 0.083% nebulizer solution [id number] Yes USE 1 VIAL IN NEBULIZER 4 TIMES DAILY Young, Clinton D, MD Taking Active   amiodarone (PACERONE) 200 MG tablet [id number] Yes Take 1 tablet (200 mg total) by mouth daily. Larey Dresser, MD Taking Active   atorvastatin (LIPITOR) 80 MG tablet [id number] Yes TAKE 1 TABLET BY MOUTH EVERY DAY Larey Dresser, MD Taking Active   ATROVENT HFA 17 MCG/ACT inhaler [id number] Yes Inhale 2 puffs into the lungs every 4 (four) hours as needed for wheezing.  [provider] Taking Active Spouse/Significant Other  carvedilol (COREG) 25 MG tablet [id number] Yes TAKE 1 TABLET BY MOUTH  TWICE DAILY WITH A MEAL Larey Dresser, MD Taking Active   dapagliflozin propanediol (FARXIGA) 10 MG TABS tablet [id number] Yes Take 1 tablet (10 mg total) by mouth daily before breakfast. Larey Dresser, MD Taking Active Spouse/Significant Other  diazepam (VALIUM) 5 MG tablet [id number] Yes Take 1 tablet (5 mg total) by mouth every  8 (eight) hours as needed for anxiety. 1-2 tabs every 8 hours if needed for tremor [name], MD Taking Active   ezetimibe (ZETIA) 10 MG tablet [id number] Yes TAKE 1 TABLET BY MOUTH  DAILY Larey Dresser, MD Taking Active   finasteride (PROSCAR) 5 MG tablet [id number] Yes TAKE 1 TABLET BY MOUTH  DAILY Irine Seal, MD Taking Active   fluticasone [facility]) 50 MCG/ACT nasal spray [id number] Yes Instill 1 spray in each  nostril daily Larey Dresser, MD Taking Active   Fluticasone-Umeclidin-Vilant (TRELEGY ELLIPTA) 100-62.5-25 MCG/INH AEPB [id number] Yes Inhale 1 puff into the lungs daily. Rinse mouth Baird Lyons D, MD Taking Active Spouse/Significant Other  icosapent Ethyl (VASCEPA) 1 g capsule [id number] Yes TAKE 2 CAPSULES (2 G TOTAL) BY MOUTH 2 (TWO) TIMES A DAY. Bensimhon, Shaune Pascal, MD Taking Active   isosorbide mononitrate (IMDUR) 60 MG 24 hr tablet [id number] Yes TAKE 1.5 TABLETS (90 MG TOTAL) BY MOUTH DAILY. Bensimhon, Shaune Pascal, MD Taking Active   levothyroxine (SYNTHROID) 50 MCG tablet [id number] Yes TAKE 1 TABLET BY MOUTH  DAILY BEFORE BREAKFAST [name], MD Taking Active   Multiple Vitamins-Minerals (MULTIVITAMIN WITH MINERALS) tablet [id number] Yes Take 1 tablet by mouth daily. [provider] Taking Active Spouse/Significant Other  Multiple Vitamins-Minerals (PRESERVISION AREDS 2 PO) [id number] Yes Take 1 capsule by mouth at bedtime. [provider] Taking Active Spouse/Significant Other  nitroGLYCERIN (NITROSTAT) 0.4 MG SL tablet [id number] Yes PLACE 1 TABLET UNDER THE TONGUE EVERY 5 MINUTES AS NEEDED FOR CHEST PAIN. Larey Dresser, MD Taking Active   NON FORMULARY [id number] Yes at bedtime. CPAP And during the day as needed [provider] Taking Active Spouse/Significant Other  potassium chloride SA (KLOR-CON M20) 20 MEQ tablet [id number] Yes Take 1 tablet (20 mEq total) by mouth daily. Larey Dresser, MD Taking Active   Rivaroxaban (XARELTO) 15 MG TABS  tablet [id number] Yes Take 1 tablet (15 mg total) by mouth daily with supper. Larey Dresser, MD Taking Active Spouse/Significant Other  sacubitril-valsartan (ENTRESTO) 49-51 MG [id number] Yes Take 1 tablet by mouth 2 (two) times daily. Larey Dresser, MD Taking Active   spironolactone (ALDACTONE)  25 MG tablet [id number] Yes TAKE 1 TABLET BY MOUTH  DAILY Larey Dresser, MD Taking Active   torsemide (DEMADEX) 20 MG tablet [id number]  Take 3 tablets (60 mg total) by mouth in the morning AND 2 tablets (40 mg total) every evening. Lyda Jester M, PA-C  Active   traMADol Veatrice Bourbon) 50 MG tablet [id number] Yes Take 1 tablet (50 mg total) by mouth every 6 (six) hours as needed. [name], MD Taking Active            Med Note [place]   Tue Feb 15, 2021  3:28 PM) .          Patient Active Problem List   Diagnosis Date Noted  . Chronic systolic heart failure ([place]) 10/05/2020  . Paroxysmal atrial fibrillation ([place]) 10/05/2020  . Chronic bilateral low back pain without sciatica 07/14/2020  . DDD (degenerative disc disease), lumbar 07/14/2020  . Thiamine deficiency 04/03/2020  . Stage 3a chronic kidney disease ([place]) 03/23/2020  . Tremor, essential 03/23/2020  . Vitamin B12 deficiency anemia due to intrinsic factor deficiency 03/23/2020  . Solitary pulmonary nodule 09/18/2019  . Chronic respiratory failure with hypoxia ([place]) 12/01/2018  . Atherosclerosis of native arteries of extremity with intermittent claudication ([place]) 02/06/2018  . Carotid artery disease ([place]) 10/08/2015  . Carotid artery occlusion with infarction ([place]) 10/08/2015  . AAA (abdominal aortic aneurysm) without rupture ([place]) 04/02/2015  . Iron deficiency anemia 02/23/2015  . Prediabetes 02/17/2015  . Morbid obesity ([place]) 05/27/2014  . Chronic combined systolic and diastolic CHF, NYHA class 3 ([place]) 03/02/2014  . Diastolic dysfunction- grade 2 03/02/2014  . CAD -SVG-RCA PCI '99, CFX PCI '99. Cath '05 and '08 -  medical Rx 03/02/2014  . Biventricular ICD (implantable cardioverter-defibrillator) in place 02/24/2014  . Cardiomyopathy, ischemic- EF 20-25% 2D 02/25/14 11/24/2013  . Hx of CABG 11/24/2013  . PAD (peripheral artery disease) ([place]) 11/24/2013  . Hyperlipidemia with target LDL less than 70 06/18/2013  . OSA on CPAP 04/18/2012  . Hypothyroidism 04/18/2012  . COPD mixed type ([place]) 10/14/2010  . Essential hypertension 11/22/2009  . BPH (benign prostatic hyperplasia) 11/22/2009  . GERD 03/23/2009    Immunization History  Administered Date(s) Administered  . Fluad Quad(high Dose 65+) 10/26/2020  . Influenza Split 11/13/2011, 12/23/2014, 11/10/2017  . Influenza Whole 10/11/2009, 08/24/2010, 10/11/2012  . Influenza, High Dose Seasonal PF 09/26/2018, 09/29/2019  . Influenza,inj,Quad PF,6+ Mos 08/17/2016  . Influenza-Unspecified 12/04/2013, 11/17/2015  . PFIZER(Purple Top)SARS-COV-2 Vaccination 01/25/2020, 02/16/2020, 10/26/2020  . Pneumococcal Conjugate-13 10/15/2018  . Pneumococcal Polysaccharide-23 12/11/2006, 11/13/2011, 03/23/2020  . Td 12/11/2005  . Tdap 10/15/2018    Conditions to be addressed/monitored:  Hypertension, Hyperlipidemia, Atrial Fibrillation, Heart Failure, Coronary Artery Disease, COPD, Hypothyroidism and BPH, Back pain  Patient Care Plan: CCM Pharmacy Care Plan    Problem Identified: Hypertension, Hyperlipidemia, Atrial Fibrillation, Heart Failure, Coronary Artery Disease, COPD, Hypothyroidism and BPH, Back pain   Priority: High    Long-Range Goal: Disease management   Start Date: 01/23/2021  Expected End Date: 07/23/2021  This Visit's Progress: On track  Priority: High  Note:   Current Barriers:  . Unable to independently afford treatment regimen . Unable to independently monitor therapeutic efficacy  Pharmacist Clinical Goal(s):  Marland Kitchen Over the next 90 days, patient will verbalize ability to afford treatment regimen . achieve adherence to monitoring guidelines  and medication adherence to achieve therapeutic efficacy through collaboration with PharmD and provider.   Interventions: . 1:1 collaboration with [name]  L, MD regarding development and update of comprehensive plan of care as evidenced by provider attestation and co-signature . Inter-disciplinary care team collaboration (see longitudinal plan of care) . Comprehensive medication review performed; medication list updated in electronic medical record  Hyperlipidemia / CAD (LDL goal < 70) -controlled -s/p CABG, multiple PCI -Current treatment: . Atorvastatin 80 mg daily . Ezetimibe 10 mg daily . Vascepa 1 g - 2 cap BID . Nitroglycerin 0.4 mg SL prn -Educated on Cholesterol goals;  Benefits of statin for ASCVD risk reduction; -Recommended to continue current medication Assessed patient finances. Obtained [employer] grant for high cost Vascepa  Atrial Fibrillation (Goal: prevent stroke and major bleeding) -controlled  -CHADSVASC: 5 -Current treatment: . Rate control: Amiodarone 200 mg daily, carvedilol 25 mg BID . Anticoagulation: Xarelto 15 mg daily -Pt bruises easily but denies bleeding  -Counseled on increased risk of stroke due to Afib and benefits of anticoagulation for stroke prevention; importance of adherence to anticoagulant exactly as prescribed; bleeding risk associated with Xarelto and importance of self-monitoring for signs/symptoms of bleeding; avoidance of NSAIDs due to increased bleeding risk with anticoagulants; seeking medical attention after a head injury or if there is blood in the urine/stool; -Recommended to continue current medication  Heart Failure (Goal: BP < 130/80, control symptoms and prevent exacerbations) controlled Type: Combined Systolic and Diastolic -NYHA Class: III (marked limitation of activity) -Ejection fraction: 40-45% (Date: 12/24/20) -Current treatment:  Carvedilol 25 mg BID  Isosorbide MN 60 - 1.5 tab (90 mg) daily  Entresto 49-51  mg BID  Torsemide 20 mg - 2 tab AM and 1 tab PM  Farxiga 10 mg daily  Spironolactone 25 mg daily HS  Klor-Con 20 mEq daily  -Current home BP/HR readings: 115/60, HR 60-70 -Educated on Benefits of medications for managing symptoms and prolonging life Importance of weighing daily; if you gain more than 3 pounds in one day or 5 pounds in one week, contact cardiologist Proper diuretic administration and potassium supplementation -Recommended to continue current medication  COPD (Goal: control symptoms and prevent exacerbations) -controlled -Current treatment  . Trelegy Ellipta 1 puff daily . Albuterol HFA prn . Albuterol nebulizer BID . Atrovent HFA  -MMRC/CAT score: not on file -Pulmonary function testing: not on file -Exacerbations requiring treatment in last 6 months: none -Patient reports consistent use of maintenance inhaler -Frequency of rescue inhaler use: rare - uses nebulizer twice a day -Counseled on Proper inhaler technique; Benefits of consistent maintenance inhaler use When to use rescue inhaler -Recommended to continue current medication  Hypothyroidism (Goal: maintain TSH in goal range) -controlled -Current treatment  . Levothyroxine 50 mcg daily -Recommended to continue current medication  Tremor (Goal: improve tremors) -controlled -Current treatment: . Diazepam 5 mg q8h PRN - does not use often, only when going out of house -Medications previously tried/failed: none -Recommended to continue current medication  BPH (Goal: improve urinary frequency) -controlled -Current treatment  . Finasteride 5 mg daily -Recommended to continue current medication  Back pain (Goal: manage pain) -controlled -Current treatment  . Tramadol 50 mg q6h PRN -Recommended to continue current medication  Health Maintenance -Vaccine gaps: Shingrix -Current therapy:  . Fluticasone nasal spray PRN . Multivitamin . Preservision . Ferrous sulfate 325 mg daily HS -Patient  is satisfied with current therapy and denies issues -Recommended to continue current medication  Patient Goals/Self-Care Activities . Over the next 90 days, patient will:  - take medications as prescribed focus on medication adherence by pill box weigh daily, and contact  provider if weight gain of 3 lbs overnight or 5 lbs in a week collaborate with provider on medication access solutions  Follow Up Plan: Telephone follow up appointment with care management team member scheduled for: 3 months      Medication Assistance: - Farxiga (AZ&Me) - approved through 12/10/21 -Entresto ([employer]) - PAP in process through cardiology, planned approval 04/29/21 -Pt will not qualify for Xarelto until they meet the 4% out-of-pocket minimum  -Pt will not qualify for Trelegy PAP since household income is >250% FPL -[place] obtained for Vascepa copay. End date 12/20/2021  Pharmacy ID: [id number]  Rx BIN: [id number]  Rx GRP: [id number]  Rx PCN: [id number]  Patient's preferred pharmacy is:  [id number] [place] [address] [place] [address]Phone: [phone]: [phone] [address] [place]Phone: [phone]: [phone] [address] [address] [place]ne [place] [place]Phone: [phone]: [phone] Uses pill box? Yes Pt endorses 100% compliance  We discussed: Current pharmacy is preferred with insurance plan and patient is satisfied with pharmacy services Patient decided to: Continue current medication management strategy  Care Plan and Follow Up Patient Decision:  Patient agrees to Care Plan and Follow-up.  Plan: Telephone follow up appointment with care management team member scheduled for:  3 months  LCharlene Brooke PharmD, BWestchase Surgery Center LtdClinical Pharmacist LOrmond-by-the-Sea [facility] at GSamaritan [facility]oscopy [phone]

## 2021-04-26 ENCOUNTER — Other Ambulatory Visit (HOSPITAL_COMMUNITY): Payer: Medicare Other

## 2021-04-29 ENCOUNTER — Ambulatory Visit (HOSPITAL_COMMUNITY)
Admission: RE | Admit: 2021-04-29 | Discharge: 2021-04-29 | Disposition: A | Discharge status: Home / Self Care | Payer: Medicare Other | Source: Ambulatory Visit | Attending: Cardiology | Admitting: Cardiology

## 2021-04-29 ENCOUNTER — Other Ambulatory Visit: Payer: Self-pay

## 2021-04-29 DIAGNOSIS — I5022 Chronic systolic (congestive) heart failure: Secondary | ICD-10-CM | POA: Insufficient documentation

## 2021-04-29 LAB — BASIC METABOLIC PANEL
Anion gap: 10 (ref 5–15)
BUN: 24 mg/dL — ABNORMAL HIGH (ref 8–23)
CO2: 27 mmol/L (ref 22–32)
Calcium: 8.8 mg/dL — ABNORMAL LOW (ref 8.9–10.3)
Chloride: 101 mmol/L (ref 98–111)
Creatinine, Ser: 1.38 mg/dL — ABNORMAL HIGH (ref 0.61–1.24)
GFR, Estimated: 53 mL/min — ABNORMAL LOW (ref >60)
Glucose, Bld: 170 mg/dL — ABNORMAL HIGH (ref 70–99)
Potassium: 3.4 mmol/L — ABNORMAL LOW (ref 3.5–5.1)
Sodium: 138 mmol/L (ref 135–145)

## 2021-05-05 ENCOUNTER — Telehealth (HOSPITAL_COMMUNITY): Payer: Self-pay

## 2021-05-05 MED ORDER — POTASSIUM CHLORIDE CRYS ER 20 MEQ PO TBCR: 30.0000 meq | EXTENDED_RELEASE_TABLET | Freq: Every day | ORAL | 3 refills | Status: AC

## 2021-05-05 NOTE — Telephone Encounter (Signed)
Patients wife linda advised and verbalized understanding. New Rx sent into patients pharmacy.    Meds ordered this encounter  Medications  . potassium chloride SA (KLOR-CON M20) 20 MEQ tablet    Sig: Take 1.5 tablets (30 mEq total) by mouth daily.    Dispense:  135 tablet    Refill:  3    Please cancel all previous orders for current medication. Change in dosage or pill size.

## 2021-05-05 NOTE — Telephone Encounter (Signed)
-----   Message from Consuelo Pandy, [place] sent at 05/03/2021  4:14 PM EDT ----- Renal function stable, K low. Increase KCl to 30 mEq daily.

## 2021-05-08 ENCOUNTER — Inpatient Hospital Stay (HOSPITAL_COMMUNITY)
Admission: EM | Admit: 2021-05-08 | Discharge: 2021-06-10 | DRG: 291 | Disposition: E | Payer: Medicare Other | Attending: Pulmonary Disease | Admitting: Pulmonary Disease

## 2021-05-08 ENCOUNTER — Emergency Department (HOSPITAL_COMMUNITY): Payer: Medicare Other

## 2021-05-08 DIAGNOSIS — E1165 Type 2 diabetes mellitus with hyperglycemia: Secondary | ICD-10-CM | POA: Diagnosis present

## 2021-05-08 DIAGNOSIS — Z79890 Hormone replacement therapy (postmenopausal): Secondary | ICD-10-CM

## 2021-05-08 DIAGNOSIS — Z8249 Family history of ischemic heart disease and other diseases of the circulatory system: Secondary | ICD-10-CM

## 2021-05-08 DIAGNOSIS — I447 Left bundle-branch block, unspecified: Secondary | ICD-10-CM | POA: Diagnosis present

## 2021-05-08 DIAGNOSIS — I70203 Unspecified atherosclerosis of native arteries of extremities, bilateral legs: Secondary | ICD-10-CM | POA: Diagnosis present

## 2021-05-08 DIAGNOSIS — R092 Respiratory arrest: Secondary | ICD-10-CM | POA: Diagnosis not present

## 2021-05-08 DIAGNOSIS — Z452 Encounter for adjustment and management of vascular access device: Secondary | ICD-10-CM

## 2021-05-08 DIAGNOSIS — N170 Acute kidney failure with tubular necrosis: Secondary | ICD-10-CM | POA: Diagnosis present

## 2021-05-08 DIAGNOSIS — R579 Shock, unspecified: Secondary | ICD-10-CM

## 2021-05-08 DIAGNOSIS — K625 Hemorrhage of anus and rectum: Secondary | ICD-10-CM | POA: Diagnosis present

## 2021-05-08 DIAGNOSIS — E785 Hyperlipidemia, unspecified: Secondary | ICD-10-CM | POA: Diagnosis present

## 2021-05-08 DIAGNOSIS — E11649 Type 2 diabetes mellitus with hypoglycemia without coma: Secondary | ICD-10-CM | POA: Diagnosis present

## 2021-05-08 DIAGNOSIS — I130 Hypertensive heart and chronic kidney disease with heart failure and stage 1 through stage 4 chronic kidney disease, or unspecified chronic kidney disease: Secondary | ICD-10-CM | POA: Diagnosis not present

## 2021-05-08 DIAGNOSIS — R570 Cardiogenic shock: Secondary | ICD-10-CM | POA: Diagnosis not present

## 2021-05-08 DIAGNOSIS — J9601 Acute respiratory failure with hypoxia: Secondary | ICD-10-CM

## 2021-05-08 DIAGNOSIS — E039 Hypothyroidism, unspecified: Secondary | ICD-10-CM | POA: Diagnosis present

## 2021-05-08 DIAGNOSIS — Z4659 Encounter for fitting and adjustment of other gastrointestinal appliance and device: Secondary | ICD-10-CM

## 2021-05-08 DIAGNOSIS — R61 Generalized hyperhidrosis: Secondary | ICD-10-CM | POA: Diagnosis not present

## 2021-05-08 DIAGNOSIS — Z95810 Presence of automatic (implantable) cardiac defibrillator: Secondary | ICD-10-CM

## 2021-05-08 DIAGNOSIS — E875 Hyperkalemia: Secondary | ICD-10-CM | POA: Diagnosis not present

## 2021-05-08 DIAGNOSIS — R680 Hypothermia, not associated with low environmental temperature: Secondary | ICD-10-CM | POA: Diagnosis present

## 2021-05-08 DIAGNOSIS — I5023 Acute on chronic systolic (congestive) heart failure: Secondary | ICD-10-CM | POA: Diagnosis present

## 2021-05-08 DIAGNOSIS — Z781 Physical restraint status: Secondary | ICD-10-CM

## 2021-05-08 DIAGNOSIS — G4733 Obstructive sleep apnea (adult) (pediatric): Secondary | ICD-10-CM | POA: Diagnosis present

## 2021-05-08 DIAGNOSIS — Z66 Do not resuscitate: Secondary | ICD-10-CM | POA: Diagnosis not present

## 2021-05-08 DIAGNOSIS — T380X5A Adverse effect of glucocorticoids and synthetic analogues, initial encounter: Secondary | ICD-10-CM | POA: Diagnosis present

## 2021-05-08 DIAGNOSIS — J470 Bronchiectasis with acute lower respiratory infection: Secondary | ICD-10-CM | POA: Diagnosis present

## 2021-05-08 DIAGNOSIS — R7989 Other specified abnormal findings of blood chemistry: Secondary | ICD-10-CM

## 2021-05-08 DIAGNOSIS — I252 Old myocardial infarction: Secondary | ICD-10-CM

## 2021-05-08 DIAGNOSIS — E1151 Type 2 diabetes mellitus with diabetic peripheral angiopathy without gangrene: Secondary | ICD-10-CM | POA: Diagnosis present

## 2021-05-08 DIAGNOSIS — Z87891 Personal history of nicotine dependence: Secondary | ICD-10-CM

## 2021-05-08 DIAGNOSIS — E870 Hyperosmolality and hypernatremia: Secondary | ICD-10-CM | POA: Diagnosis not present

## 2021-05-08 DIAGNOSIS — N184 Chronic kidney disease, stage 4 (severe): Secondary | ICD-10-CM | POA: Diagnosis present

## 2021-05-08 DIAGNOSIS — I482 Chronic atrial fibrillation, unspecified: Secondary | ICD-10-CM | POA: Diagnosis present

## 2021-05-08 DIAGNOSIS — E872 Acidosis: Secondary | ICD-10-CM | POA: Diagnosis present

## 2021-05-08 DIAGNOSIS — Z955 Presence of coronary angioplasty implant and graft: Secondary | ICD-10-CM

## 2021-05-08 DIAGNOSIS — I469 Cardiac arrest, cause unspecified: Secondary | ICD-10-CM | POA: Diagnosis not present

## 2021-05-08 DIAGNOSIS — O3480 Maternal care for other abnormalities of pelvic organs, unspecified trimester: Secondary | ICD-10-CM

## 2021-05-08 DIAGNOSIS — R000 Tachycardia, unspecified: Secondary | ICD-10-CM

## 2021-05-08 DIAGNOSIS — A419 Sepsis, unspecified organism: Secondary | ICD-10-CM | POA: Diagnosis not present

## 2021-05-08 DIAGNOSIS — R0902 Hypoxemia: Secondary | ICD-10-CM | POA: Diagnosis not present

## 2021-05-08 DIAGNOSIS — N471 Phimosis: Secondary | ICD-10-CM | POA: Diagnosis present

## 2021-05-08 DIAGNOSIS — J9801 Acute bronchospasm: Secondary | ICD-10-CM | POA: Diagnosis present

## 2021-05-08 DIAGNOSIS — Z83438 Family history of other disorder of lipoprotein metabolism and other lipidemia: Secondary | ICD-10-CM

## 2021-05-08 DIAGNOSIS — D6959 Other secondary thrombocytopenia: Secondary | ICD-10-CM | POA: Diagnosis not present

## 2021-05-08 DIAGNOSIS — I472 Ventricular tachycardia: Secondary | ICD-10-CM | POA: Diagnosis present

## 2021-05-08 DIAGNOSIS — J189 Pneumonia, unspecified organism: Secondary | ICD-10-CM | POA: Diagnosis not present

## 2021-05-08 DIAGNOSIS — N138 Other obstructive and reflux uropathy: Secondary | ICD-10-CM | POA: Diagnosis present

## 2021-05-08 DIAGNOSIS — J9600 Acute respiratory failure, unspecified whether with hypoxia or hypercapnia: Secondary | ICD-10-CM | POA: Diagnosis not present

## 2021-05-08 DIAGNOSIS — R4020 Unspecified coma: Secondary | ICD-10-CM | POA: Diagnosis not present

## 2021-05-08 DIAGNOSIS — N179 Acute kidney failure, unspecified: Secondary | ICD-10-CM | POA: Diagnosis not present

## 2021-05-08 DIAGNOSIS — R0603 Acute respiratory distress: Secondary | ICD-10-CM

## 2021-05-08 DIAGNOSIS — Z79899 Other long term (current) drug therapy: Secondary | ICD-10-CM

## 2021-05-08 DIAGNOSIS — Z0189 Encounter for other specified special examinations: Secondary | ICD-10-CM

## 2021-05-08 DIAGNOSIS — K5900 Constipation, unspecified: Secondary | ICD-10-CM | POA: Diagnosis not present

## 2021-05-08 DIAGNOSIS — Z515 Encounter for palliative care: Secondary | ICD-10-CM | POA: Diagnosis not present

## 2021-05-08 DIAGNOSIS — R778 Other specified abnormalities of plasma proteins: Secondary | ICD-10-CM

## 2021-05-08 DIAGNOSIS — J80 Acute respiratory distress syndrome: Secondary | ICD-10-CM | POA: Diagnosis not present

## 2021-05-08 DIAGNOSIS — J9602 Acute respiratory failure with hypercapnia: Secondary | ICD-10-CM | POA: Diagnosis present

## 2021-05-08 DIAGNOSIS — D638 Anemia in other chronic diseases classified elsewhere: Secondary | ICD-10-CM | POA: Diagnosis present

## 2021-05-08 DIAGNOSIS — K219 Gastro-esophageal reflux disease without esophagitis: Secondary | ICD-10-CM | POA: Diagnosis present

## 2021-05-08 DIAGNOSIS — I255 Ischemic cardiomyopathy: Secondary | ICD-10-CM | POA: Diagnosis present

## 2021-05-08 DIAGNOSIS — Z20822 Contact with and (suspected) exposure to covid-19: Secondary | ICD-10-CM | POA: Diagnosis not present

## 2021-05-08 DIAGNOSIS — R319 Hematuria, unspecified: Secondary | ICD-10-CM | POA: Diagnosis not present

## 2021-05-08 DIAGNOSIS — J90 Pleural effusion, not elsewhere classified: Secondary | ICD-10-CM

## 2021-05-08 DIAGNOSIS — N390 Urinary tract infection, site not specified: Secondary | ICD-10-CM | POA: Diagnosis present

## 2021-05-08 DIAGNOSIS — Z7901 Long term (current) use of anticoagulants: Secondary | ICD-10-CM

## 2021-05-08 DIAGNOSIS — N320 Bladder-neck obstruction: Secondary | ICD-10-CM | POA: Diagnosis present

## 2021-05-08 DIAGNOSIS — Z85118 Personal history of other malignant neoplasm of bronchus and lung: Secondary | ICD-10-CM

## 2021-05-08 DIAGNOSIS — Z9889 Other specified postprocedural states: Secondary | ICD-10-CM

## 2021-05-08 DIAGNOSIS — G9341 Metabolic encephalopathy: Secondary | ICD-10-CM | POA: Diagnosis not present

## 2021-05-08 DIAGNOSIS — Z888 Allergy status to other drugs, medicaments and biological substances status: Secondary | ICD-10-CM

## 2021-05-08 DIAGNOSIS — I509 Heart failure, unspecified: Secondary | ICD-10-CM

## 2021-05-08 DIAGNOSIS — I4892 Unspecified atrial flutter: Secondary | ICD-10-CM | POA: Diagnosis not present

## 2021-05-08 DIAGNOSIS — Z951 Presence of aortocoronary bypass graft: Secondary | ICD-10-CM

## 2021-05-08 DIAGNOSIS — I080 Rheumatic disorders of both mitral and aortic valves: Secondary | ICD-10-CM | POA: Diagnosis present

## 2021-05-08 DIAGNOSIS — R34 Anuria and oliguria: Secondary | ICD-10-CM | POA: Diagnosis present

## 2021-05-08 DIAGNOSIS — I5082 Biventricular heart failure: Secondary | ICD-10-CM | POA: Diagnosis not present

## 2021-05-08 DIAGNOSIS — N401 Enlarged prostate with lower urinary tract symptoms: Secondary | ICD-10-CM | POA: Diagnosis present

## 2021-05-08 DIAGNOSIS — R6521 Severe sepsis with septic shock: Secondary | ICD-10-CM | POA: Diagnosis not present

## 2021-05-08 DIAGNOSIS — J9690 Respiratory failure, unspecified, unspecified whether with hypoxia or hypercapnia: Secondary | ICD-10-CM

## 2021-05-08 DIAGNOSIS — L89311 Pressure ulcer of right buttock, stage 1: Secondary | ICD-10-CM | POA: Diagnosis not present

## 2021-05-08 DIAGNOSIS — I2510 Atherosclerotic heart disease of native coronary artery without angina pectoris: Secondary | ICD-10-CM | POA: Diagnosis present

## 2021-05-08 DIAGNOSIS — Z9289 Personal history of other medical treatment: Secondary | ICD-10-CM

## 2021-05-08 DIAGNOSIS — E1122 Type 2 diabetes mellitus with diabetic chronic kidney disease: Secondary | ICD-10-CM | POA: Diagnosis present

## 2021-05-08 DIAGNOSIS — Z794 Long term (current) use of insulin: Secondary | ICD-10-CM

## 2021-05-08 DIAGNOSIS — J449 Chronic obstructive pulmonary disease, unspecified: Secondary | ICD-10-CM | POA: Diagnosis not present

## 2021-05-08 DIAGNOSIS — Z6841 Body Mass Index (BMI) 40.0 and over, adult: Secondary | ICD-10-CM

## 2021-05-08 DIAGNOSIS — Z923 Personal history of irradiation: Secondary | ICD-10-CM

## 2021-05-08 MED ORDER — FUROSEMIDE 10 MG/ML IJ SOLN
40.0000 mg | Freq: Once | INTRAMUSCULAR | Status: AC
  Administered 2021-05-08: 40 mg via INTRAVENOUS
  Filled 2021-05-08: qty 4

## 2021-05-08 MED ORDER — FENTANYL 2500MCG IN NS 250ML (10MCG/ML) PREMIX INFUSION
0.0000 ug/h | INTRAVENOUS | Status: DC
  Administered 2021-05-08: 25 ug/h via INTRAVENOUS
  Administered 2021-05-09: 150 ug/h via INTRAVENOUS
  Administered 2021-05-10 (×2): 200 ug/h via INTRAVENOUS
  Filled 2021-05-08 (×4): qty 250

## 2021-05-08 MED ORDER — VECURONIUM BROMIDE 10 MG IV SOLR
10.0000 mg | Freq: Once | INTRAVENOUS | Status: AC
  Administered 2021-05-08: 10 mg via INTRAVENOUS

## 2021-05-08 MED ORDER — VECURONIUM BROMIDE 10 MG IV SOLR
INTRAVENOUS | Status: AC
  Administered 2021-05-08: 10 mg via INTRAVENOUS
  Filled 2021-05-08: qty 10

## 2021-05-08 NOTE — ED Triage Notes (Signed)
Pt arrives via EMS unresponsive from home. 18g & 20g placed, pt received duoneb, epi, solumedrol, mag en route. Paced rhythm. Pt BVM on arrival, intubated by EDP on arrival.

## 2021-05-08 NOTE — ED Provider Notes (Signed)
Procedure Name: Intubation Date/Time: 04/11/2021 11:34 PM Performed by: Abigail Butts, PA-C Pre-anesthesia Checklist: Patient identified, Patient being monitored, Emergency Drugs available, Timeout performed and Suction available Oxygen Delivery Method: Non-rebreather mask Preoxygenation: Pre-oxygenation with 100% oxygen Induction Type: Rapid sequence Ventilation: Mask ventilation without difficulty Laryngoscope Size: Glidescope and 4 Tube size: 7.5 mm Number of attempts: 2 Airway Equipment and Method: Video-laryngoscopy Placement Confirmation: ETT inserted through vocal cords under direct vision,  CO2 detector and Breath sounds checked- equal and bilateral Secured at: 24 cm Tube secured with: ETT holder Dental Injury: Teeth and Oropharynx as per pre-operative assessment  Difficulty Due To: Difficulty was anticipated        [name] 04/22/2021 [id number]    Palumbo, April, MD 05/09/21 0010

## 2021-05-08 NOTE — ED Provider Notes (Signed)
[facility] EMERGENCY DEPARTMENT Provider Note   CSN: [id number] Arrival date & time: 04/26/2021  2313     History No chief complaint on file.   [name] is a 75 y.o. male.  The history is provided by the EMS personnel. The history is limited by the condition of the patient.  Shortness of Breath Severity:  Severe Onset quality:  Sudden Timing:  Constant Progression:  Worsening Chronicity:  New Context: not weather changes   Relieved by:  Nothing Worsened by:  Nothing Ineffective treatments: magnesium solumedrol nebs and epi. Associated symptoms: no fever and no vomiting   Risk factors: no recent surgery   EMS called out for SOB.  Medications given but patient became unresponsive and needed assisted ventilations.       Past Medical History:  Diagnosis Date  . AAA (abdominal aortic aneurysm) ([place])    followed by Dr. Bridgett Larsson  . Adenomatous colon polyp 01/2004  . Anemia    hx  . Automatic implantable cardioverter-defibrillator in situ   . AVM (arteriovenous malformation)   . BPH (benign prostatic hypertrophy)   . CAD (coronary artery disease)    s/p CABGx2 in 1996  . Carotid artery stenosis    LCEA - Dr. Bridgett Larsson in 2013  . CHF (congestive heart failure) ([place])   . Complication of anesthesia    claustrophobic, unabe to lie on back more than 4 hours at time due to back  . COPD (chronic obstructive pulmonary disease) ([place])   . Diabetes mellitus    DIET CONTROLLED- pt states that this was a misdiagnosis, he was treated while in the hospital  but returned home, loss a massive amount of weight and he has not had a problem with his blood sugar since. States everything has been normal for about 3 years.  . Diverticulosis   . Dyspnea   . Dysrhythmia    ICD-defibrillator  . Fatigue   . GERD (gastroesophageal reflux disease)   . H/O hiatal hernia   . History of radiation therapy 04/09/17-04/17/17   SBRT right lung 54 Gy in 3 fractions  . HLD (hyperlipidemia)    . Hypertension   . Hypothyroidism   . Ischemic cardiomyopathy   . Myocardial infarction ([place])   . 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 OSA on CPAP    AHI durign total sleep 14.69/hr, during REM 50.91/hr  . Peripheral vascular disease ([facility])    LCEA, L renal artery stent, bilat iliac stents, R SFA stenosis  . Tremor   . Ventricular tachycardia ([place]) 09/09/2014   Amiodarone was started after appropriate defibrillator shocks for ventricular tachycardia in October 2008    Patient Active Problem List   Diagnosis Date Noted  . Chronic systolic heart failure ([place]) 10/05/2020  . Paroxysmal atrial fibrillation ([place]) 10/05/2020  . Chronic bilateral low back pain without sciatica 07/14/2020  . DDD (degenerative disc disease), lumbar 07/14/2020  . Thiamine deficiency 04/03/2020  . Stage 3a chronic kidney disease ([place]) 03/23/2020  . Tremor, essential 03/23/2020  . Vitamin B12 deficiency anemia due to intrinsic factor deficiency 03/23/2020  . Solitary pulmonary nodule 09/18/2019  . Chronic respiratory failure with hypoxia ([place]) 12/01/2018  . Atherosclerosis of native arteries of extremity with intermittent claudication ([place]) 02/06/2018  . Carotid artery disease ([place]) 10/08/2015  . Carotid artery occlusion with infarction ([place]) 10/08/2015  . AAA (abdominal aortic aneurysm) without rupture ([place]) 04/02/2015  . Iron deficiency anemia 02/23/2015  . Prediabetes 02/17/2015  . Morbid  obesity ([place]) 05/27/2014  . Chronic combined systolic and diastolic CHF, NYHA class 3 ([place]) 03/02/2014  . Diastolic dysfunction- grade 2 03/02/2014  . CAD -SVG-RCA PCI '99, CFX PCI '99. Cath '05 and '08 - medical Rx 03/02/2014  . Biventricular ICD (implantable cardioverter-defibrillator) in place 02/24/2014  . Cardiomyopathy, ischemic- EF 20-25% 2D 02/25/14 11/24/2013  . Hx of CABG 11/24/2013  . PAD (peripheral artery disease) ([place]) 11/24/2013  . Hyperlipidemia with target LDL less than 70 06/18/2013  . OSA on CPAP  04/18/2012  . Hypothyroidism 04/18/2012  . COPD mixed type ([place]) 10/14/2010  . Essential hypertension 11/22/2009  . BPH (benign prostatic hyperplasia) 11/22/2009  . GERD 03/23/2009    Past Surgical History:  Procedure Laterality Date  . ABDOMINAL AORTIC ENDOVASCULAR STENT GRAFT [name] 01/06/2014   Procedure: ABDOMINAL AORTIC ENDOVASCULAR STENT GRAFT- GORE; ULTRASOUND GUIDED;  Surgeon: Conrad Mercer, MD;  Location: [facility];  Service: Vascular;  Laterality: [name];  . ANGIOPLASTY     BILATERAL  LE  W/STENTS  . BI-VENTRICULAR IMPLANTABLE CARDIOVERTER DEFIBRILLATOR UPGRADE [name] 10/09/2014   Procedure: BI-VENTRICULAR IMPLANTABLE CARDIOVERTER DEFIBRILLATOR UPGRADE;  Surgeon: Evans Lance, MD;  Location[facility] CATH LAB;  Service: Cardiovascular;  Laterality: [name];  . BIV ICD GENERTAOR CHANGE OUT  10/09/2014   UPGRADE TO BIV        BY DR Lovena Le  . 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 Adora Fridge)  . CARDIAC CATHETERIZATION  10/03/2002   SVG sequentially to OM1 & OM2 totally occluded at ostium, SVG to PDA totally occluded within previously placed prox vein graft stent, smal distal AAA, bialt iliac stents with 30% left end-stent restenosis and 50% right end-stent restnosis(Dr. Gerrie Nordmann)  . CARDIAC CATHETERIZATION  06/11/1998   L main with 20% narrowing in distal 1/3; LAD with 1st diagonla having 70% ostial narrowing, 2nd diagonal with 40% narrowing in prox third, LIMA & RIMA widely patent; in-stent restenosis of RCIA with successful PTA and new prox SVTRCA stent for residual disease (Dr. Marella Chimes)  . CARDIAC DEFIBRILLATOR PLACEMENT  06/2005   Guidant Vitality HE - ischemic cardiomyopathy - Dr. Marella Chimes  . CARDIOVERSION [name] 11/19/2020   Procedure: CARDIOVERSION;  Surgeon: Larey Dresser, MD;  Location: [facility] ENDOSCOPY;   Service: Cardiovascular;  Laterality: [name];  . CAROTID ENDARTERECTOMY Left 01/04/12  . CORONARY ANGIOPLASTY  01/08/2004   cutting balloon atherectomy & percutaneous intervention of RCIA in-stent restenosis (Dr. Marella Chimes)  . CORONARY ARTERY BYPASS GRAFT  11/04/1985   x2 - PDA and sequential DX-OM (Dr. Redmond Pulling)  . ENDARTERECTOMY  01/04/2012   Procedure: ENDARTERECTOMY CAROTID;left  Surgeon: Hinda Lenis, MD;  Location: [facility];  Service: Vascular;  Laterality: Left;  with patch angioplasty  . FUDUCIAL PLACEMENT [name] 02/23/2017   Procedure: PLACEMENT OF FUDUCIAL;  Surgeon: [name], MD;  Location: [facility];  Service: Thoracic;  Laterality: [name];  . ICD GENERATOR CHANGE  05/02/2010   [employer], retail  . ILIAC ARTERY STENT Bilateral 03/1997   and L SFA PTA  . Iron infusion  June 16, 2012  . LEFT HEART CATHETERIZATION WITH CORONARY ANGIOGRAM [name] 07/06/2014   Procedure: LEFT HEART CATHETERIZATION WITH CORONARY ANGIOGRAM;  Surgeon: Peter M Martinique, MD;  Location: [facility] CATH LAB;  Service: Cardiovascular;  Laterality: [name];  . NM [place]  09/2012   lexiscan myoview - mod-severe perfusion defect r/t infarct or scar w/mild periinfarct ishcemia in basal inferior, mid inferior, apical inferior, basal inferolateral & mid inferoalteral regions - EF 21% low risk scan  . POLYPECTOMY    . RENAL ARTERY STENT Left 03/24/2004   6x30mm Genesis stent (Dr. Marella Chimes)  . RIGHT/LEFT HEART CATH AND CORONARY ANGIOGRAPHY [name] 12/28/2017   Procedure: RIGHT/LEFT HEART CATH AND CORONARY ANGIOGRAPHY;  Surgeon: Larey Dresser, MD;  Location: [facility] CV LAB;  Service: Cardiovascular;  Laterality: [name];  . TRANSTHORACIC ECHOCARDIOGRAM  12/2012   EF 30-35; LV mod to severely dilated, mod concentric hypertrophy, severe hypokinesis of inferolateral myocardium, moderate hypokineis of anteroseptal region, grade 1 diastolic dysfunction; mod MR; LA mod-severely dialted; RV mod dialted; RA mildly  dilated; PA peak pressure 77mmHg  . VIDEO BRONCHOSCOPY WITH ENDOBRONCHIAL NAVIGATION [name] 02/23/2017   Procedure: VIDEO BRONCHOSCOPY WITH ENDOBRONCHIAL NAVIGATION;  Surgeon: [name], MD;  Location[facility] OR;  Service: Thoracic;  Laterality: [name];       Family History  Problem Relation Age of Onset  . Lung cancer Mother   . Cancer Mother   . Diabetes Mother   . Hypertension Mother   . Hyperlipidemia Mother   . Heart disease Mother        before age 7  . Heart attack Father   . Heart disease Father        before age 52  . Hypertension Father   . Colon cancer Sister   . Cancer Sister   . Hypertension Sister   . Hyperlipidemia Sister   . Parkinson's disease Sister   . Diabetes Sister   . Heart disease Sister        before age 3  . Heart attack Maternal Grandmother   . Diabetes Daughter     Social History   Tobacco Use  . Smoking status: Former Smoker    Packs/day: 1.50    Years: 40.00    Pack years: 60.00    Types: Cigarettes, Pipe    Quit date: 12/11/2002    Years since quitting: 18.4  . Smokeless tobacco: Never Used  . Tobacco comment: Pt reports smoking 1 pack/day before quitting in 2004  Vaping Use  . Vaping Use: Never used  Substance Use Topics  . Alcohol use: No    Alcohol/week: 0.0 standard drinks  . Drug use: No    Home Medications Prior to Admission medications   Medication Sig Start Date End Date Taking? Authorizing Provider  albuterol (PROVENTIL HFA;VENTOLIN HFA) 108 (90 BASE) MCG/ACT inhaler Inhale 2 puffs into the lungs every 6 (six) hours as needed for wheezing or shortness of breath. 12/25/14   [name], MD  albuterol (PROVENTIL) (2.5 MG/3ML) 0.083% nebulizer solution USE 1 VIAL IN NEBULIZER 4 TIMES DAILY 12/30/20   Baird Lyons D, MD  amiodarone (PACERONE) 200 MG tablet Take 1 tablet (200 mg total) by mouth daily. 12/17/20   Larey Dresser, MD  atorvastatin (LIPITOR) 80 MG tablet TAKE 1 TABLET BY MOUTH EVERY DAY 01/31/21   Larey Dresser, MD  ATROVENT [facility] 17 MCG/ACT inhaler Inhale 2 puffs into the lungs every 4 (four) hours as needed for wheezing.  11/13/18   [provider]  carvedilol (COREG) 12.5 MG tablet Take 1 tablet (12.5 mg total) by mouth 2 (two) times daily with a meal. 04/21/21   Lyda Jester M, PA-C  dapagliflozin propanediol (FARXIGA) 10 MG TABS tablet Take 1 tablet (10  mg total) by mouth daily before breakfast. 06/29/20   Larey Dresser, MD  diazepam (VALIUM) 5 MG tablet Take 1 tablet (5 mg total) by mouth every 8 (eight) hours as needed for anxiety. 1-2 tabs every 8 hours if needed for tremor 03/23/20   [name], MD  ezetimibe (ZETIA) 10 MG tablet TAKE 1 TABLET BY MOUTH  DAILY 02/21/21   Larey Dresser, MD  finasteride (PROSCAR) 5 MG tablet TAKE 1 TABLET BY MOUTH  DAILY 04/13/21   Irine Seal, MD  fluticasone [facility]) 50 MCG/ACT nasal spray Instill 1 spray in each  nostril daily 06/01/16   Larey Dresser, MD  Fluticasone-Umeclidin-Vilant (TRELEGY ELLIPTA) 100-62.5-25 MCG/INH AEPB Inhale 1 puff into the lungs daily. Rinse mouth 05/31/20   Young, Tarri Fuller D, MD  icosapent Ethyl (VASCEPA) 1 g capsule TAKE 2 CAPSULES (2 G TOTAL) BY MOUTH 2 (TWO) TIMES A DAY. 02/21/21   Bensimhon, Shaune Pascal, MD  isosorbide mononitrate (IMDUR) 60 MG 24 hr tablet TAKE 1.5 TABLETS (90 MG TOTAL) BY MOUTH DAILY. 04/14/21   Bensimhon, Shaune Pascal, MD  levothyroxine (SYNTHROID) 50 MCG tablet TAKE 1 TABLET BY MOUTH  DAILY BEFORE BREAKFAST 05/02/20   [name], MD  Multiple Vitamins-Minerals (MULTIVITAMIN WITH MINERALS) tablet Take 1 tablet by mouth daily.    [provider]  Multiple Vitamins-Minerals (PRESERVISION AREDS 2 PO) Take 1 capsule by mouth at bedtime.    [provider]  nitroGLYCERIN (NITROSTAT) 0.4 MG SL tablet PLACE 1 TABLET UNDER THE TONGUE EVERY 5 MINUTES AS NEEDED FOR CHEST PAIN. 04/18/21   Larey Dresser, MD  NON FORMULARY at bedtime. CPAP And during the day as needed    [provider]  potassium chloride SA (KLOR-CON M20) 20 MEQ tablet Take 1.5 tablets (30 mEq total) by mouth daily. 05/05/21   Consuelo Pandy, PA-C  Rivaroxaban (XARELTO) 15 MG TABS tablet Take 1 tablet (15 mg total) by mouth daily with supper. 08/10/20   Larey Dresser, MD  sacubitril-valsartan (ENTRESTO) 97-103 MG Take 1 tablet by mouth 2 (two) times daily. 04/21/21   Lyda Jester M, PA-C  spironolactone (ALDACTONE) 25 MG tablet TAKE 1 TABLET BY MOUTH  DAILY 04/14/21   Larey Dresser, MD  torsemide (DEMADEX) 20 MG tablet Take 3 tablets (60 mg total) by mouth in the morning AND 2 tablets (40 mg total) every evening. 04/19/21   Lyda Jester M, PA-C  traMADol (ULTRAM) 50 MG tablet Take 1 tablet (50 mg total) by mouth every 6 (six) hours as needed. 07/14/20   [name], MD    Allergies    Tussionex pennkinetic er Jerene Canny polst-cpm polst er] and Ace inhibitors  Review of Systems   Review of Systems  Unable to perform ROS: Severe respiratory distress  Constitutional: Negative for fever.  HENT: Negative for facial swelling.   Eyes: Negative for redness.  Respiratory: Positive for shortness of breath.   Cardiovascular: Positive for leg swelling.  Gastrointestinal: Negative for vomiting.  Skin: Negative for wound.    Physical Exam Updated Vital Signs BP 134/76   Pulse 84   Temp (!) 95.9 F (35.5 C) (Temporal)   Resp (!) 24   Ht 5\' 7"  (1.702 m)   SpO2 98%   BMI 44.79 kg/m   Physical Exam Vitals and nursing note reviewed.  Constitutional:      Comments: Unresponsive   HENT:     Head: Normocephalic and atraumatic.     Nose: Nose  normal.     Mouth/Throat:     Comments: Frothy pink secretions Eyes:     Conjunctiva/sclera: Conjunctivae normal.     Pupils: Pupils are equal, round, and reactive to light.  Cardiovascular:     Rate and Rhythm: Regular rhythm. Bradycardia present.     Heart sounds: Normal heart sounds.  Pulmonary:     Breath sounds: Decreased  air movement present. Wheezing and rales present.  Abdominal:     General: Bowel sounds are normal.     Hernia: No hernia is present.  Musculoskeletal:     Cervical back: No rigidity.     Right lower leg: Edema present.     Left lower leg: Edema present.  Skin:    General: Skin is dry.     Capillary Refill: Capillary refill takes 2 to 3 seconds.  Neurological:     Deep Tendon Reflexes: Reflexes normal.  Psychiatric:     Comments: Unable      ED Results / Procedures / Treatments   Labs (all labs ordered are listed, but only abnormal results are displayed) Results for orders placed or performed during the hospital encounter of 04/29/2021  CBC with Differential/Platelet  Result Value Ref Range   WBC 14.8 (H) 4.0 - 10.5 K/uL   RBC 5.32 4.22 - 5.81 MIL/uL   Hemoglobin 16.5 13.0 - 17.0 g/dL   HCT 53.3 (H) 39.0 - 52.0 %   MCV 100.2 (H) 80.0 - 100.0 fL   MCH 31.0 26.0 - 34.0 pg   MCHC 31.0 30.0 - 36.0 g/dL   RDW 13.3 11.5 - 15.5 %   Platelets 253 150 - 400 K/uL   nRBC 0.0 0.0 - 0.2 %   Neutrophils Relative % 59 %   Neutro Abs 8.8 (H) 1.7 - 7.7 K/uL   Lymphocytes Relative 30 %   Lymphs Abs 4.4 (H) 0.7 - 4.0 K/uL   Monocytes Relative 5 %   Monocytes Absolute 0.7 0.1 - 1.0 K/uL   Eosinophils Relative 3 %   Eosinophils Absolute 0.5 0.0 - 0.5 K/uL   Basophils Relative 1 %   Basophils Absolute 0.1 0.0 - 0.1 K/uL   Immature Granulocytes 2 %   Abs Immature Granulocytes 0.31 (H) 0.00 - 0.07 K/uL  Comprehensive metabolic panel  Result Value Ref Range   Sodium 138 135 - 145 mmol/L   Potassium 5.0 3.5 - 5.1 mmol/L   Chloride 104 98 - 111 mmol/L   CO2 23 22 - 32 mmol/L   Glucose, Bld 308 (H) 70 - 99 mg/dL   BUN 37 (H) 8 - 23 mg/dL   Creatinine, Ser 2.14 (H) 0.61 - 1.24 mg/dL   Calcium 8.1 (L) 8.9 - 10.3 mg/dL   Total Protein 6.3 (L) 6.5 - 8.1 g/dL   Albumin 3.3 (L) 3.5 - 5.0 g/dL   AST 36 15 - 41 U/L   ALT 26 0 - 44 U/L   Alkaline Phosphatase 108 38 - 126 U/L   Total Bilirubin  1.0 0.3 - 1.2 mg/dL   GFR, Estimated 31 (L) >60 mL/min   Anion gap 11 5 - 15  I-Stat arterial blood gas, ED  Result Value Ref Range   pH, Arterial 7.172 (LL) 7.350 - 7.450   pCO2 arterial 65.1 (HH) 32.0 - 48.0 mmHg   pO2, Arterial 77 (L) 83.0 - 108.0 mmHg   Bicarbonate 24.3 20.0 - 28.0 mmol/L   TCO2 26 22 - 32 mmol/L   O2 Saturation 92.0 %  Acid-base deficit 7.0 (H) 0.0 - 2.0 mmol/L   Sodium 137 135 - 145 mmol/L   Potassium 4.8 3.5 - 5.1 mmol/L   Calcium, Ion 1.16 1.15 - 1.40 mmol/L   HCT 52.0 39.0 - 52.0 %   Hemoglobin 17.7 (H) 13.0 - 17.0 g/dL   Patient temperature 95.9 F    Collection site Radial    Drawn by Operator    Sample type ARTERIAL   Troponin I (High Sensitivity)  Result Value Ref Range   Troponin I (High Sensitivity) 92 (H) <18 ng/L   DG Chest Portable 1 View  Result Date: 05/01/2021 CLINICAL DATA:  COPD, unresponsive, intubated EXAM: PORTABLE CHEST 1 VIEW COMPARISON:  03/23/2020 FINDINGS: Two supine frontal views of the chest are obtained. Endotracheal tube overlies tracheal air column, tip midway between thoracic inlet and carina. Enteric catheter passes below diaphragm tip excluded by collimation. Multi lead pacer/AICD is unchanged. Cardiac silhouette is enlarged, accentuated by portable AP technique and supine positioning. Post therapeutic changes are seen within the upper lobes. There is diffuse bilateral ground-glass airspace disease, favor edema over pneumonia. No large effusion. No pneumothorax. IMPRESSION: 1. Support devices as above. 2. Widespread bilateral ground-glass airspace disease, favor edema over infection. 3. Stable post therapeutic changes within the upper lobes. Electronically Signed   By: Randa Ngo M.D.   On: 05/07/2021 23:43    EKG  EKG Interpretation  Date/Time:  Monday May 09 2021 00:10:46 EDT Ventricular Rate:  82 PR Interval:    QRS Duration: 203 QT Interval:  464 QTC Calculation: 542 R Axis:   151 Text Interpretation: Electronic  ventricular pacemaker Premature ventricular complexes Right bundle branch block Confirmed by Randal Buba, Nova Schmuhl ([id number]) on 05/09/2021 12:36:59 AM       Radiology DG Chest Portable 1 View  Result Date: 04/10/2021 CLINICAL DATA:  COPD, unresponsive, intubated EXAM: PORTABLE CHEST 1 VIEW COMPARISON:  03/23/2020 FINDINGS: Two supine frontal views of the chest are obtained. Endotracheal tube overlies tracheal air column, tip midway between thoracic inlet and carina. Enteric catheter passes below diaphragm tip excluded by collimation. Multi lead pacer/AICD is unchanged. Cardiac silhouette is enlarged, accentuated by portable AP technique and supine positioning. Post therapeutic changes are seen within the upper lobes. There is diffuse bilateral ground-glass airspace disease, favor edema over pneumonia. No large effusion. No pneumothorax. IMPRESSION: 1. Support devices as above. 2. Widespread bilateral ground-glass airspace disease, favor edema over infection. 3. Stable post therapeutic changes within the upper lobes. Electronically Signed   By: Randa Ngo M.D.   On: 05/01/2021 23:43    Procedures Procedures   Medications Ordered in ED Medications  fentaNYL 2532mcg in NS 226mL (30mcg/ml) infusion-PREMIX (50 mcg/hr Intravenous Rate/Dose Change 05/03/2021 2348)  furosemide (LASIX) injection 40 mg (40 mg Intravenous Given 05/09/2021 2335)  vecuronium (NORCURON) injection 10 mg (10 mg Intravenous Given 05/01/2021 2352)    ED Course  I have reviewed the triage vital signs and the nursing notes.  Pertinent labs & imaging results that were available during my care of the patient were reviewed by me and considered in my medical decision making (see chart for details).  MDM Reviewed: previous chart, nursing note and vitals Interpretation: ECG, labs and x-ray (elevated troponin, pulmonary edema by me) Total time providing critical care: 75-105 minutes (RSI and fentanyl drip ). This excludes time spent performing  separately reportable procedures and services. Consults: critical care  CRITICAL CARE Performed by: Mathan Darroch K Alaisha Eversley-Rasch Total critical care time: 75 minutes Critical care time was  exclusive of separately billable procedures and treating other patients. Critical care was necessary to treat or prevent imminent or life-threatening deterioration. Critical care was time spent personally by me on the following activities: development of treatment plan with patient and/or surrogate as well as nursing, discussions with consultants, evaluation of patient's response to treatment, examination of patient, obtaining history from patient or surrogate, ordering and performing treatments and interventions, ordering and review of laboratory studies, ordering and review of radiographic studies, pulse oximetry and re-evaluation of patient's condition.  [name] was evaluated in Emergency Department on 05/09/2021 for the symptoms described in the history of present illness. He was evaluated in the context of the global COVID-19 pandemic, which necessitated consideration that the patient might be at risk for infection with the SARS-CoV-2 virus that causes COVID-19. Institutional protocols and algorithms that pertain to the evaluation of patients at risk for COVID-19 are in a state of rapid change based on information released by regulatory bodies including the CDC and federal and state organizations. These policies and algorithms were followed during the patient's care in the ED.  Final Clinical Impression(s) / ED Diagnoses Final diagnoses:  Respiratory arrest [facility])    Admit to ICU   Kristi Norment, MD 05/09/21 [id number]

## 2021-05-09 ENCOUNTER — Inpatient Hospital Stay (HOSPITAL_COMMUNITY): Payer: Medicare Other

## 2021-05-09 ENCOUNTER — Encounter: Payer: Self-pay | Admitting: Oncology

## 2021-05-09 ENCOUNTER — Other Ambulatory Visit: Payer: Self-pay

## 2021-05-09 ENCOUNTER — Encounter (HOSPITAL_COMMUNITY): Payer: Self-pay | Admitting: Emergency Medicine

## 2021-05-09 DIAGNOSIS — Z7189 Other specified counseling: Secondary | ICD-10-CM | POA: Diagnosis not present

## 2021-05-09 DIAGNOSIS — I4891 Unspecified atrial fibrillation: Secondary | ICD-10-CM | POA: Diagnosis not present

## 2021-05-09 DIAGNOSIS — J9602 Acute respiratory failure with hypercapnia: Secondary | ICD-10-CM

## 2021-05-09 DIAGNOSIS — J90 Pleural effusion, not elsewhere classified: Secondary | ICD-10-CM | POA: Diagnosis not present

## 2021-05-09 DIAGNOSIS — R34 Anuria and oliguria: Secondary | ICD-10-CM | POA: Diagnosis present

## 2021-05-09 DIAGNOSIS — I5020 Unspecified systolic (congestive) heart failure: Secondary | ICD-10-CM | POA: Diagnosis not present

## 2021-05-09 DIAGNOSIS — I130 Hypertensive heart and chronic kidney disease with heart failure and stage 1 through stage 4 chronic kidney disease, or unspecified chronic kidney disease: Secondary | ICD-10-CM | POA: Diagnosis not present

## 2021-05-09 DIAGNOSIS — I5082 Biventricular heart failure: Secondary | ICD-10-CM | POA: Diagnosis not present

## 2021-05-09 DIAGNOSIS — J9690 Respiratory failure, unspecified, unspecified whether with hypoxia or hypercapnia: Secondary | ICD-10-CM | POA: Diagnosis not present

## 2021-05-09 DIAGNOSIS — N281 Cyst of kidney, acquired: Secondary | ICD-10-CM | POA: Diagnosis not present

## 2021-05-09 DIAGNOSIS — J9621 Acute and chronic respiratory failure with hypoxia: Secondary | ICD-10-CM | POA: Diagnosis not present

## 2021-05-09 DIAGNOSIS — J9811 Atelectasis: Secondary | ICD-10-CM | POA: Diagnosis not present

## 2021-05-09 DIAGNOSIS — J810 Acute pulmonary edema: Secondary | ICD-10-CM | POA: Diagnosis not present

## 2021-05-09 DIAGNOSIS — Z4682 Encounter for fitting and adjustment of non-vascular catheter: Secondary | ICD-10-CM | POA: Diagnosis not present

## 2021-05-09 DIAGNOSIS — A419 Sepsis, unspecified organism: Secondary | ICD-10-CM | POA: Diagnosis not present

## 2021-05-09 DIAGNOSIS — N179 Acute kidney failure, unspecified: Secondary | ICD-10-CM

## 2021-05-09 DIAGNOSIS — R000 Tachycardia, unspecified: Secondary | ICD-10-CM | POA: Diagnosis not present

## 2021-05-09 DIAGNOSIS — E1151 Type 2 diabetes mellitus with diabetic peripheral angiopathy without gangrene: Secondary | ICD-10-CM | POA: Diagnosis present

## 2021-05-09 DIAGNOSIS — J470 Bronchiectasis with acute lower respiratory infection: Secondary | ICD-10-CM | POA: Diagnosis present

## 2021-05-09 DIAGNOSIS — I509 Heart failure, unspecified: Secondary | ICD-10-CM | POA: Diagnosis not present

## 2021-05-09 DIAGNOSIS — E039 Hypothyroidism, unspecified: Secondary | ICD-10-CM | POA: Diagnosis present

## 2021-05-09 DIAGNOSIS — I4892 Unspecified atrial flutter: Secondary | ICD-10-CM | POA: Diagnosis not present

## 2021-05-09 DIAGNOSIS — I480 Paroxysmal atrial fibrillation: Secondary | ICD-10-CM | POA: Diagnosis not present

## 2021-05-09 DIAGNOSIS — R0600 Dyspnea, unspecified: Secondary | ICD-10-CM | POA: Diagnosis not present

## 2021-05-09 DIAGNOSIS — I517 Cardiomegaly: Secondary | ICD-10-CM | POA: Diagnosis not present

## 2021-05-09 DIAGNOSIS — J984 Other disorders of lung: Secondary | ICD-10-CM | POA: Diagnosis not present

## 2021-05-09 DIAGNOSIS — R6521 Severe sepsis with septic shock: Secondary | ICD-10-CM | POA: Diagnosis not present

## 2021-05-09 DIAGNOSIS — I340 Nonrheumatic mitral (valve) insufficiency: Secondary | ICD-10-CM | POA: Diagnosis not present

## 2021-05-09 DIAGNOSIS — R092 Respiratory arrest: Secondary | ICD-10-CM | POA: Diagnosis not present

## 2021-05-09 DIAGNOSIS — I359 Nonrheumatic aortic valve disorder, unspecified: Secondary | ICD-10-CM | POA: Diagnosis not present

## 2021-05-09 DIAGNOSIS — E875 Hyperkalemia: Secondary | ICD-10-CM | POA: Diagnosis not present

## 2021-05-09 DIAGNOSIS — J9601 Acute respiratory failure with hypoxia: Secondary | ICD-10-CM | POA: Diagnosis not present

## 2021-05-09 DIAGNOSIS — D696 Thrombocytopenia, unspecified: Secondary | ICD-10-CM | POA: Diagnosis not present

## 2021-05-09 DIAGNOSIS — K117 Disturbances of salivary secretion: Secondary | ICD-10-CM | POA: Diagnosis not present

## 2021-05-09 DIAGNOSIS — Z20822 Contact with and (suspected) exposure to covid-19: Secondary | ICD-10-CM | POA: Diagnosis not present

## 2021-05-09 DIAGNOSIS — C3490 Malignant neoplasm of unspecified part of unspecified bronchus or lung: Secondary | ICD-10-CM | POA: Diagnosis not present

## 2021-05-09 DIAGNOSIS — N471 Phimosis: Secondary | ICD-10-CM | POA: Diagnosis not present

## 2021-05-09 DIAGNOSIS — J9600 Acute respiratory failure, unspecified whether with hypoxia or hypercapnia: Secondary | ICD-10-CM | POA: Diagnosis not present

## 2021-05-09 DIAGNOSIS — E8779 Other fluid overload: Secondary | ICD-10-CM | POA: Diagnosis not present

## 2021-05-09 DIAGNOSIS — I255 Ischemic cardiomyopathy: Secondary | ICD-10-CM | POA: Diagnosis not present

## 2021-05-09 DIAGNOSIS — J9620 Acute and chronic respiratory failure, unspecified whether with hypoxia or hypercapnia: Secondary | ICD-10-CM | POA: Diagnosis not present

## 2021-05-09 DIAGNOSIS — R0602 Shortness of breath: Secondary | ICD-10-CM | POA: Diagnosis not present

## 2021-05-09 DIAGNOSIS — R570 Cardiogenic shock: Secondary | ICD-10-CM | POA: Diagnosis not present

## 2021-05-09 DIAGNOSIS — R579 Shock, unspecified: Secondary | ICD-10-CM | POA: Diagnosis not present

## 2021-05-09 DIAGNOSIS — N184 Chronic kidney disease, stage 4 (severe): Secondary | ICD-10-CM | POA: Diagnosis present

## 2021-05-09 DIAGNOSIS — I4819 Other persistent atrial fibrillation: Secondary | ICD-10-CM | POA: Diagnosis not present

## 2021-05-09 DIAGNOSIS — Z452 Encounter for adjustment and management of vascular access device: Secondary | ICD-10-CM | POA: Diagnosis not present

## 2021-05-09 DIAGNOSIS — R29898 Other symptoms and signs involving the musculoskeletal system: Secondary | ICD-10-CM | POA: Diagnosis not present

## 2021-05-09 DIAGNOSIS — N170 Acute kidney failure with tubular necrosis: Secondary | ICD-10-CM | POA: Diagnosis not present

## 2021-05-09 DIAGNOSIS — R0902 Hypoxemia: Secondary | ICD-10-CM | POA: Diagnosis not present

## 2021-05-09 DIAGNOSIS — E1122 Type 2 diabetes mellitus with diabetic chronic kidney disease: Secondary | ICD-10-CM | POA: Diagnosis present

## 2021-05-09 DIAGNOSIS — Z515 Encounter for palliative care: Secondary | ICD-10-CM | POA: Diagnosis not present

## 2021-05-09 DIAGNOSIS — L89311 Pressure ulcer of right buttock, stage 1: Secondary | ICD-10-CM | POA: Diagnosis not present

## 2021-05-09 DIAGNOSIS — J811 Chronic pulmonary edema: Secondary | ICD-10-CM | POA: Diagnosis not present

## 2021-05-09 DIAGNOSIS — N1832 Chronic kidney disease, stage 3b: Secondary | ICD-10-CM | POA: Diagnosis not present

## 2021-05-09 DIAGNOSIS — Z66 Do not resuscitate: Secondary | ICD-10-CM | POA: Diagnosis not present

## 2021-05-09 DIAGNOSIS — Z6841 Body Mass Index (BMI) 40.0 and over, adult: Secondary | ICD-10-CM | POA: Diagnosis not present

## 2021-05-09 DIAGNOSIS — G9341 Metabolic encephalopathy: Secondary | ICD-10-CM | POA: Diagnosis not present

## 2021-05-09 DIAGNOSIS — Z9911 Dependence on respirator [ventilator] status: Secondary | ICD-10-CM | POA: Diagnosis not present

## 2021-05-09 DIAGNOSIS — I5023 Acute on chronic systolic (congestive) heart failure: Secondary | ICD-10-CM | POA: Diagnosis not present

## 2021-05-09 DIAGNOSIS — R609 Edema, unspecified: Secondary | ICD-10-CM | POA: Diagnosis not present

## 2021-05-09 DIAGNOSIS — J449 Chronic obstructive pulmonary disease, unspecified: Secondary | ICD-10-CM | POA: Diagnosis not present

## 2021-05-09 DIAGNOSIS — E1165 Type 2 diabetes mellitus with hyperglycemia: Secondary | ICD-10-CM | POA: Diagnosis present

## 2021-05-09 DIAGNOSIS — J189 Pneumonia, unspecified organism: Secondary | ICD-10-CM | POA: Diagnosis not present

## 2021-05-09 LAB — I-STAT ARTERIAL BLOOD GAS, ED
Acid-base deficit: 7 mmol/L — ABNORMAL HIGH (ref 0.0–2.0)
Bicarbonate: 24.3 mmol/L (ref 20.0–28.0)
Calcium, Ion: 1.16 mmol/L (ref 1.15–1.40)
HCT: 52 % (ref 39.0–52.0)
Hemoglobin: 17.7 g/dL — ABNORMAL HIGH (ref 13.0–17.0)
O2 Saturation: 92 %
Patient temperature: 95.9
Potassium: 4.8 mmol/L (ref 3.5–5.1)
Sodium: 137 mmol/L (ref 135–145)
TCO2: 26 mmol/L (ref 22–32)
pCO2 arterial: 65.1 mmHg (ref 32.0–48.0)
pH, Arterial: 7.172 — CL (ref 7.350–7.450)
pO2, Arterial: 77 mmHg — ABNORMAL LOW (ref 83.0–108.0)

## 2021-05-09 LAB — CBC
HCT: 54.3 % — ABNORMAL HIGH (ref 39.0–52.0)
HCT: 49.9 % (ref 39.0–52.0)
Hemoglobin: 17.2 g/dL — ABNORMAL HIGH (ref 13.0–17.0)
Hemoglobin: 15.8 g/dL (ref 13.0–17.0)
MCH: 31 pg (ref 26.0–34.0)
MCH: 30.6 pg (ref 26.0–34.0)
MCHC: 31.7 g/dL (ref 30.0–36.0)
MCHC: 31.7 g/dL (ref 30.0–36.0)
MCV: 97.8 fL (ref 80.0–100.0)
MCV: 96.7 fL (ref 80.0–100.0)
Platelets: 184 10*3/uL (ref 150–400)
Platelets: 263 10*3/uL (ref 150–400)
RBC: 5.55 MIL/uL (ref 4.22–5.81)
RBC: 5.16 MIL/uL (ref 4.22–5.81)
RDW: 13.5 % (ref 11.5–15.5)
RDW: 13.4 % (ref 11.5–15.5)
WBC: 13.6 10*3/uL — ABNORMAL HIGH (ref 4.0–10.5)
WBC: 21.8 10*3/uL — ABNORMAL HIGH (ref 4.0–10.5)
nRBC: 0 % (ref 0.0–0.2)
nRBC: 0 % (ref 0.0–0.2)

## 2021-05-09 LAB — POCT I-STAT 7, (LYTES, BLD GAS, ICA,H+H)
Acid-base deficit: 6 mmol/L — ABNORMAL HIGH (ref 0.0–2.0)
Acid-base deficit: 4 mmol/L — ABNORMAL HIGH (ref 0.0–2.0)
Bicarbonate: 22.6 mmol/L (ref 20.0–28.0)
Bicarbonate: 20.4 mmol/L (ref 20.0–28.0)
Calcium, Ion: 1.17 mmol/L (ref 1.15–1.40)
Calcium, Ion: 1.17 mmol/L (ref 1.15–1.40)
HCT: 54 % — ABNORMAL HIGH (ref 39.0–52.0)
HCT: 46 % (ref 39.0–52.0)
Hemoglobin: 18.4 g/dL — ABNORMAL HIGH (ref 13.0–17.0)
Hemoglobin: 15.6 g/dL (ref 13.0–17.0)
O2 Saturation: 84 %
O2 Saturation: 93 %
Patient temperature: 98.6
Patient temperature: 100.8
Potassium: 4.8 mmol/L (ref 3.5–5.1)
Potassium: 3.8 mmol/L (ref 3.5–5.1)
Sodium: 140 mmol/L (ref 135–145)
Sodium: 139 mmol/L (ref 135–145)
TCO2: 24 mmol/L (ref 22–32)
TCO2: 21 mmol/L — ABNORMAL LOW (ref 22–32)
pCO2 arterial: 55.8 mmHg — ABNORMAL HIGH (ref 32.0–48.0)
pCO2 arterial: 36.3 mmHg (ref 32.0–48.0)
pH, Arterial: 7.215 — ABNORMAL LOW (ref 7.350–7.450)
pH, Arterial: 7.362 (ref 7.350–7.450)
pO2, Arterial: 60 mmHg — ABNORMAL LOW (ref 83.0–108.0)
pO2, Arterial: 72 mmHg — ABNORMAL LOW (ref 83.0–108.0)

## 2021-05-09 LAB — URINALYSIS, ROUTINE W REFLEX MICROSCOPIC
Bacteria, UA: NONE SEEN
Bilirubin Urine: NEGATIVE
Glucose, UA: >=500 mg/dL — AB
Ketones, ur: NEGATIVE mg/dL
Leukocytes,Ua: NEGATIVE
Nitrite: NEGATIVE
Protein, ur: NEGATIVE mg/dL
RBC / HPF: >50 RBC/hpf — ABNORMAL HIGH (ref 0–5)
Specific Gravity, Urine: 1.018 (ref 1.005–1.030)
pH: 5 (ref 5.0–8.0)

## 2021-05-09 LAB — BASIC METABOLIC PANEL
Anion gap: 12 (ref 5–15)
BUN: 41 mg/dL — ABNORMAL HIGH (ref 8–23)
CO2: 18 mmol/L — ABNORMAL LOW (ref 22–32)
Calcium: 8.5 mg/dL — ABNORMAL LOW (ref 8.9–10.3)
Chloride: 107 mmol/L (ref 98–111)
Creatinine, Ser: 2.21 mg/dL — ABNORMAL HIGH (ref 0.61–1.24)
GFR, Estimated: 30 mL/min — ABNORMAL LOW (ref >60)
Glucose, Bld: 289 mg/dL — ABNORMAL HIGH (ref 70–99)
Potassium: 4.5 mmol/L (ref 3.5–5.1)
Sodium: 137 mmol/L (ref 135–145)

## 2021-05-09 LAB — CBC WITH DIFFERENTIAL/PLATELET
Abs Immature Granulocytes: 0.31 10*3/uL — ABNORMAL HIGH (ref 0.00–0.07)
Basophils Absolute: 0.1 10*3/uL (ref 0.0–0.1)
Basophils Relative: 1 %
Eosinophils Absolute: 0.5 10*3/uL (ref 0.0–0.5)
Eosinophils Relative: 3 %
HCT: 53.3 % — ABNORMAL HIGH (ref 39.0–52.0)
Hemoglobin: 16.5 g/dL (ref 13.0–17.0)
Immature Granulocytes: 2 %
Lymphocytes Relative: 30 %
Lymphs Abs: 4.4 10*3/uL — ABNORMAL HIGH (ref 0.7–4.0)
MCH: 31 pg (ref 26.0–34.0)
MCHC: 31 g/dL (ref 30.0–36.0)
MCV: 100.2 fL — ABNORMAL HIGH (ref 80.0–100.0)
Monocytes Absolute: 0.7 10*3/uL (ref 0.1–1.0)
Monocytes Relative: 5 %
Neutro Abs: 8.8 10*3/uL — ABNORMAL HIGH (ref 1.7–7.7)
Neutrophils Relative %: 59 %
Platelets: 253 10*3/uL (ref 150–400)
RBC: 5.32 MIL/uL (ref 4.22–5.81)
RDW: 13.3 % (ref 11.5–15.5)
WBC: 14.8 10*3/uL — ABNORMAL HIGH (ref 4.0–10.5)
nRBC: 0 % (ref 0.0–0.2)

## 2021-05-09 LAB — GLUCOSE, CAPILLARY
Glucose-Capillary: 304 mg/dL — ABNORMAL HIGH (ref 70–99)
Glucose-Capillary: 214 mg/dL — ABNORMAL HIGH (ref 70–99)
Glucose-Capillary: 204 mg/dL — ABNORMAL HIGH (ref 70–99)
Glucose-Capillary: 294 mg/dL — ABNORMAL HIGH (ref 70–99)
Glucose-Capillary: 263 mg/dL — ABNORMAL HIGH (ref 70–99)

## 2021-05-09 LAB — COMPREHENSIVE METABOLIC PANEL
ALT: 26 U/L (ref 0–44)
AST: 36 U/L (ref 15–41)
Albumin: 3.3 g/dL — ABNORMAL LOW (ref 3.5–5.0)
Alkaline Phosphatase: 108 U/L (ref 38–126)
Anion gap: 11 (ref 5–15)
BUN: 37 mg/dL — ABNORMAL HIGH (ref 8–23)
CO2: 23 mmol/L (ref 22–32)
Calcium: 8.1 mg/dL — ABNORMAL LOW (ref 8.9–10.3)
Chloride: 104 mmol/L (ref 98–111)
Creatinine, Ser: 2.14 mg/dL — ABNORMAL HIGH (ref 0.61–1.24)
GFR, Estimated: 31 mL/min — ABNORMAL LOW (ref >60)
Glucose, Bld: 308 mg/dL — ABNORMAL HIGH (ref 70–99)
Potassium: 5 mmol/L (ref 3.5–5.1)
Sodium: 138 mmol/L (ref 135–145)
Total Bilirubin: 1 mg/dL (ref 0.3–1.2)
Total Protein: 6.3 g/dL — ABNORMAL LOW (ref 6.5–8.1)

## 2021-05-09 LAB — COOXEMETRY PANEL
Carboxyhemoglobin: 0.6 % (ref 0.5–1.5)
Methemoglobin: 1 % (ref 0.0–1.5)
O2 Saturation: 81.4 %
Total hemoglobin: 16.1 g/dL — ABNORMAL HIGH (ref 12.0–16.0)

## 2021-05-09 LAB — LACTIC ACID, PLASMA
Lactic Acid, Venous: 2.5 mmol/L (ref 0.5–1.9)
Lactic Acid, Venous: 2.1 mmol/L (ref 0.5–1.9)

## 2021-05-09 LAB — APTT
aPTT: 46 seconds — ABNORMAL HIGH (ref 24–36)
aPTT: 70 seconds — ABNORMAL HIGH (ref 24–36)

## 2021-05-09 LAB — MAGNESIUM: Magnesium: 3.2 mg/dL — ABNORMAL HIGH (ref 1.7–2.4)

## 2021-05-09 LAB — TROPONIN I (HIGH SENSITIVITY)
Troponin I (High Sensitivity): 92 ng/L — ABNORMAL HIGH (ref <18)
Troponin I (High Sensitivity): 276 ng/L (ref <18)

## 2021-05-09 LAB — HEPARIN LEVEL (UNFRACTIONATED): Heparin Unfractionated: 0.87 IU/mL — ABNORMAL HIGH (ref 0.30–0.70)

## 2021-05-09 LAB — RESP PANEL BY RT-PCR (FLU A&B, COVID) ARPGX2
Influenza A by PCR: NEGATIVE
Influenza B by PCR: NEGATIVE
SARS Coronavirus 2 by RT PCR: NEGATIVE

## 2021-05-09 LAB — MRSA PCR SCREENING: MRSA by PCR: NEGATIVE

## 2021-05-09 LAB — PROTIME-INR
INR: 1.2 (ref 0.8–1.2)
Prothrombin Time: 14.9 seconds (ref 11.4–15.2)

## 2021-05-09 LAB — PHOSPHORUS: Phosphorus: 6.4 mg/dL — ABNORMAL HIGH (ref 2.5–4.6)

## 2021-05-09 LAB — BRAIN NATRIURETIC PEPTIDE: B Natriuretic Peptide: 281.2 pg/mL — ABNORMAL HIGH (ref 0.0–100.0)

## 2021-05-09 LAB — CORTISOL: Cortisol, Plasma: 32.2 ug/dL

## 2021-05-09 LAB — PROCALCITONIN: Procalcitonin: <0.1 ng/mL

## 2021-05-09 MED ORDER — ARFORMOTEROL TARTRATE 15 MCG/2ML IN NEBU
15.0000 ug | INHALATION_SOLUTION | Freq: Two times a day (BID) | RESPIRATORY_TRACT | Status: DC
  Administered 2021-05-09 – 2021-06-01 (×47): 15 ug via RESPIRATORY_TRACT
  Filled 2021-05-09 (×48): qty 2

## 2021-05-09 MED ORDER — INSULIN GLARGINE 100 UNIT/ML ~~LOC~~ SOLN
8.0000 [IU] | Freq: Two times a day (BID) | SUBCUTANEOUS | Status: DC
  Administered 2021-05-09 (×2): 8 [IU] via SUBCUTANEOUS
  Filled 2021-05-09 (×4): qty 0.08

## 2021-05-09 MED ORDER — DOCUSATE SODIUM 100 MG PO CAPS
100.0000 mg | ORAL_CAPSULE | Freq: Two times a day (BID) | ORAL | Status: DC | PRN
  Filled 2021-05-09: qty 1

## 2021-05-09 MED ORDER — METHYLPREDNISOLONE SODIUM SUCC 40 MG IJ SOLR
40.0000 mg | Freq: Two times a day (BID) | INTRAMUSCULAR | Status: DC
  Administered 2021-05-09: 40 mg via INTRAVENOUS
  Filled 2021-05-09: qty 1

## 2021-05-09 MED ORDER — SUCCINYLCHOLINE CHLORIDE 20 MG/ML IJ SOLN
INTRAMUSCULAR | Status: DC | PRN
  Administered 2021-05-08: 150 mg via INTRAVENOUS

## 2021-05-09 MED ORDER — METHYLPREDNISOLONE SODIUM SUCC 40 MG IJ SOLR
40.0000 mg | Freq: Four times a day (QID) | INTRAMUSCULAR | Status: DC
  Administered 2021-05-09: 40 mg via INTRAVENOUS
  Filled 2021-05-09: qty 1

## 2021-05-09 MED ORDER — IPRATROPIUM-ALBUTEROL 0.5-2.5 (3) MG/3ML IN SOLN
3.0000 mL | RESPIRATORY_TRACT | Status: DC
  Administered 2021-05-09 (×3): 3 mL via RESPIRATORY_TRACT
  Filled 2021-05-09 (×3): qty 3

## 2021-05-09 MED ORDER — INSULIN ASPART 100 UNIT/ML IJ SOLN
0.0000 [IU] | INTRAMUSCULAR | Status: DC
  Administered 2021-05-09: 7 [IU] via SUBCUTANEOUS
  Administered 2021-05-09 (×2): 8 [IU] via SUBCUTANEOUS
  Administered 2021-05-09 (×2): 5 [IU] via SUBCUTANEOUS
  Administered 2021-05-09 – 2021-05-10 (×3): 8 [IU] via SUBCUTANEOUS
  Administered 2021-05-10: 5 [IU] via SUBCUTANEOUS
  Administered 2021-05-10: 8 [IU] via SUBCUTANEOUS
  Administered 2021-05-10: 5 [IU] via SUBCUTANEOUS
  Administered 2021-05-11: 8 [IU] via SUBCUTANEOUS
  Administered 2021-05-11: 5 [IU] via SUBCUTANEOUS
  Administered 2021-05-11 (×2): 11 [IU] via SUBCUTANEOUS
  Administered 2021-05-11 (×2): 5 [IU] via SUBCUTANEOUS
  Administered 2021-05-12 (×3): 8 [IU] via SUBCUTANEOUS
  Administered 2021-05-12: 5 [IU] via SUBCUTANEOUS
  Administered 2021-05-12: 8 [IU] via SUBCUTANEOUS
  Administered 2021-05-12 – 2021-05-13 (×2): 5 [IU] via SUBCUTANEOUS
  Administered 2021-05-13: 3 [IU] via SUBCUTANEOUS
  Administered 2021-05-13: 8 [IU] via SUBCUTANEOUS
  Administered 2021-05-13: 5 [IU] via SUBCUTANEOUS

## 2021-05-09 MED ORDER — CHLORHEXIDINE GLUCONATE 0.12% ORAL RINSE (MEDLINE KIT)
15.0000 mL | Freq: Two times a day (BID) | OROMUCOSAL | Status: DC
  Administered 2021-05-09 – 2021-06-01 (×46): 15 mL via OROMUCOSAL

## 2021-05-09 MED ORDER — SODIUM CHLORIDE 0.9 % IV SOLN
2.0000 g | INTRAVENOUS | Status: AC
  Administered 2021-05-09 – 2021-05-15 (×6): 2 g via INTRAVENOUS
  Filled 2021-05-09: qty 2
  Filled 2021-05-09 (×6): qty 20

## 2021-05-09 MED ORDER — HYDROCORTISONE NA SUCCINATE PF 100 MG IJ SOLR
100.0000 mg | Freq: Two times a day (BID) | INTRAMUSCULAR | Status: DC
  Administered 2021-05-09 – 2021-05-10 (×2): 100 mg via INTRAVENOUS
  Filled 2021-05-09 (×2): qty 2

## 2021-05-09 MED ORDER — ORAL CARE MOUTH RINSE
15.0000 mL | OROMUCOSAL | Status: DC
  Administered 2021-05-09 – 2021-05-30 (×212): 15 mL via OROMUCOSAL

## 2021-05-09 MED ORDER — AMIODARONE HCL 200 MG PO TABS
200.0000 mg | ORAL_TABLET | Freq: Every day | ORAL | Status: DC
  Administered 2021-05-09 – 2021-05-17 (×9): 200 mg
  Filled 2021-05-09 (×9): qty 1

## 2021-05-09 MED ORDER — NOREPINEPHRINE 16 MG/250ML-% IV SOLN
0.0000 ug/min | INTRAVENOUS | Status: DC
  Administered 2021-05-09: 30 ug/min via INTRAVENOUS
  Administered 2021-05-10: 8 ug/min via INTRAVENOUS
  Administered 2021-05-13: 3 ug/min via INTRAVENOUS
  Administered 2021-05-15: 2 ug/min via INTRAVENOUS
  Filled 2021-05-09 (×6): qty 250

## 2021-05-09 MED ORDER — INSULIN ASPART 100 UNIT/ML IJ SOLN: 0.0000 [IU] | INTRAMUSCULAR | Status: DC

## 2021-05-09 MED ORDER — CHLORHEXIDINE GLUCONATE CLOTH 2 % EX PADS
6.0000 | MEDICATED_PAD | Freq: Every day | CUTANEOUS | Status: DC
  Administered 2021-05-09 – 2021-06-01 (×21): 6 via TOPICAL

## 2021-05-09 MED ORDER — INSULIN GLARGINE 100 UNIT/ML ~~LOC~~ SOLN: 12.0000 [IU] | Freq: Two times a day (BID) | SUBCUTANEOUS | Status: DC

## 2021-05-09 MED ORDER — BUDESONIDE 0.5 MG/2ML IN SUSP
0.5000 mg | Freq: Two times a day (BID) | RESPIRATORY_TRACT | Status: DC
  Administered 2021-05-09 – 2021-06-01 (×46): 0.5 mg via RESPIRATORY_TRACT
  Filled 2021-05-09 (×47): qty 2

## 2021-05-09 MED ORDER — LACTATED RINGERS IV BOLUS
1000.0000 mL | Freq: Once | INTRAVENOUS | Status: AC
  Administered 2021-05-09: 1000 mL via INTRAVENOUS

## 2021-05-09 MED ORDER — PANTOPRAZOLE SODIUM 40 MG IV SOLR
40.0000 mg | Freq: Every day | INTRAVENOUS | Status: DC
  Administered 2021-05-09 – 2021-05-15 (×7): 40 mg via INTRAVENOUS
  Filled 2021-05-09 (×8): qty 40

## 2021-05-09 MED ORDER — ETOMIDATE 2 MG/ML IV SOLN
INTRAVENOUS | Status: DC | PRN
  Administered 2021-05-08: 20 mg via INTRAVENOUS

## 2021-05-09 MED ORDER — SODIUM CHLORIDE 0.9 % IV SOLN
250.0000 mL | INTRAVENOUS | Status: DC
  Administered 2021-05-09 – 2021-05-19 (×2): 250 mL via INTRAVENOUS
  Administered 2021-05-19: 500 mL via INTRAVENOUS
  Administered 2021-05-21 – 2021-05-28 (×3): 250 mL via INTRAVENOUS

## 2021-05-09 MED ORDER — VASOPRESSIN 20 UNITS/100 ML INFUSION FOR SHOCK
0.0400 [IU]/min | INTRAVENOUS | Status: DC
  Administered 2021-05-09 – 2021-05-11 (×5): 0.04 [IU]/min via INTRAVENOUS
  Filled 2021-05-09 (×4): qty 100

## 2021-05-09 MED ORDER — POLYETHYLENE GLYCOL 3350 17 G PO PACK
17.0000 g | PACK | Freq: Every day | ORAL | Status: DC | PRN
  Administered 2021-05-11 – 2021-05-17 (×2): 17 g via ORAL
  Filled 2021-05-09 (×2): qty 1

## 2021-05-09 MED ORDER — DEXMEDETOMIDINE HCL IN NACL 400 MCG/100ML IV SOLN
0.4000 ug/kg/h | INTRAVENOUS | Status: DC
  Administered 2021-05-09: 0.4 ug/kg/h via INTRAVENOUS
  Administered 2021-05-10 (×2): 0.6 ug/kg/h via INTRAVENOUS
  Administered 2021-05-10: 0.4 ug/kg/h via INTRAVENOUS
  Administered 2021-05-11: 0.5 ug/kg/h via INTRAVENOUS
  Administered 2021-05-11 (×2): 0.7 ug/kg/h via INTRAVENOUS
  Filled 2021-05-09 (×6): qty 100

## 2021-05-09 MED ORDER — SODIUM CHLORIDE 0.9 % IV SOLN
500.0000 mg | INTRAVENOUS | Status: DC
  Administered 2021-05-09 – 2021-05-13 (×5): 500 mg via INTRAVENOUS
  Filled 2021-05-09 (×5): qty 500

## 2021-05-09 MED ORDER — LACTATED RINGERS IV SOLN: INTRAVENOUS | Status: AC

## 2021-05-09 MED ORDER — IPRATROPIUM-ALBUTEROL 0.5-2.5 (3) MG/3ML IN SOLN
RESPIRATORY_TRACT | Status: AC
  Filled 2021-05-09: qty 3

## 2021-05-09 MED ORDER — SODIUM CHLORIDE 0.9 % IV SOLN
2.0000 g | Freq: Once | INTRAVENOUS | Status: AC
  Administered 2021-05-09: 2 g via INTRAVENOUS
  Filled 2021-05-09: qty 20

## 2021-05-09 MED ORDER — NOREPINEPHRINE 4 MG/250ML-% IV SOLN
0.0000 ug/min | INTRAVENOUS | Status: DC
  Administered 2021-05-09: 2 ug/min via INTRAVENOUS
  Administered 2021-05-09: 20 ug/min via INTRAVENOUS
  Filled 2021-05-09 (×2): qty 250

## 2021-05-09 MED ORDER — LEVOTHYROXINE SODIUM 50 MCG PO TABS
50.0000 ug | ORAL_TABLET | Freq: Every day | ORAL | Status: DC
  Administered 2021-05-09 – 2021-06-01 (×24): 50 ug
  Filled 2021-05-09 (×24): qty 1

## 2021-05-09 MED ORDER — HEPARIN SODIUM (PORCINE) 5000 UNIT/ML IJ SOLN: 5000.0000 [IU] | Freq: Three times a day (TID) | INTRAMUSCULAR | Status: DC

## 2021-05-09 MED ORDER — SODIUM CHLORIDE 0.9 % IV SOLN: INTRAVENOUS | Status: DC | PRN

## 2021-05-09 MED ORDER — HEPARIN (PORCINE) 25000 UT/250ML-% IV SOLN
1500.0000 [IU]/h | INTRAVENOUS | Status: DC
  Administered 2021-05-09: 1450 [IU]/h via INTRAVENOUS
  Administered 2021-05-09 – 2021-05-11 (×3): 1850 [IU]/h via INTRAVENOUS
  Filled 2021-05-09 (×4): qty 250

## 2021-05-09 NOTE — Progress Notes (Signed)
eLink Physician-Brief Progress Note Patient Name: [name] DOB: 01/20/1946 MRN: [id number]   Date of Service  05/09/2021  HPI/Events of Note  LA 2.5 MAP ok. On vent.  Asking for restraints, self extubation risk.   eICU Interventions  Follow LA Keep MAP > 65  Bilateral soft non violent wrist restraints ordered to prevent self injury and harm from pulling lines and         Intervention Category Intermediate Interventions: Other:;Diagnostic test evaluation  Elmer Sow 05/09/2021, 3:15 AM

## 2021-05-09 NOTE — Consult Note (Signed)
Subjective:    Consult requested by Dr. Ina Homes.  I was asked to see [name] for foley placement.   I last saw him in the office in 3/21. He has a history of BPH with BOO and has been on finasteride.  He has long history of phimosis and penile retraction.  He was admitted for acute respiratory failure and an attempt at foley placement was unsuccessful because of the severe phimosis.  ROS:  Review of Systems  Unable to perform ROS: Intubated    Allergies  Allergen Reactions  . Tussionex Pennkinetic Er [Hydrocod Polst-Cpm Polst Er] Other (See Comments)    UNSPECIFIED REACTION  > "caused prostate problems"  . Ace Inhibitors Cough    Past Medical History:  Diagnosis Date  . AAA (abdominal aortic aneurysm) ([place])    followed by Dr. Bridgett Larsson  . Adenomatous colon polyp 01/2004  . Anemia    hx  . Automatic implantable cardioverter-defibrillator in situ   . AVM (arteriovenous malformation)   . BPH (benign prostatic hypertrophy)   . CAD (coronary artery disease)    s/p CABGx2 in 1996  . Carotid artery stenosis    LCEA - Dr. Bridgett Larsson in 2013  . CHF (congestive heart failure) ([place])   . Complication of anesthesia    claustrophobic, unabe to lie on back more than 4 hours at time due to back  . COPD (chronic obstructive pulmonary disease) ([place])   . Diabetes mellitus    DIET CONTROLLED- pt states that this was a misdiagnosis, he was treated while in the hospital  but returned home, loss a massive amount of weight and he has not had a problem with his blood sugar since. States everything has been normal for about 3 years.  . Diverticulosis   . Dyspnea   . Dysrhythmia    ICD-defibrillator  . Fatigue   . GERD (gastroesophageal reflux disease)   . H/O hiatal hernia   . History of radiation therapy 04/09/17-04/17/17   SBRT right lung 54 Gy in 3 fractions  . HLD (hyperlipidemia)   . Hypertension   . Hypothyroidism   . Ischemic cardiomyopathy   . Myocardial infarction ([place])   . 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 OSA on CPAP    AHI durign total sleep 14.69/hr, during REM 50.91/hr  . Peripheral vascular disease ([facility])    LCEA, L renal artery stent, bilat iliac stents, R SFA stenosis  . Tremor   . Ventricular tachycardia ([place]) 09/09/2014   Amiodarone was started after appropriate defibrillator shocks for ventricular tachycardia in October 2008    Past Surgical History:  Procedure Laterality Date  . ABDOMINAL AORTIC ENDOVASCULAR STENT GRAFT [name] 01/06/2014   Procedure: ABDOMINAL AORTIC ENDOVASCULAR STENT GRAFT- GORE; ULTRASOUND GUIDED;  Surgeon: Conrad Quasqueton, MD;  Location: [facility];  Service: Vascular;  Laterality: [name];  . ANGIOPLASTY     BILATERAL  LE  W/STENTS  . BI-VENTRICULAR IMPLANTABLE CARDIOVERTER DEFIBRILLATOR UPGRADE [name] 10/09/2014   Procedure: BI-VENTRICULAR IMPLANTABLE CARDIOVERTER DEFIBRILLATOR UPGRADE;  Surgeon: Evans Lance, MD;  Location: [facility] CATH LAB;  Service: Cardiovascular;  Laterality: [name];  . BIV ICD GENERTAOR CHANGE OUT  10/09/2014   UPGRADE TO BIV        BY DR Lovena Le  . 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  Adora Fridge)  . CARDIAC CATHETERIZATION  10/03/2002   SVG sequentially to OM1 & OM2 totally occluded at ostium, SVG to PDA totally occluded within previously placed prox vein graft stent, smal distal AAA, bialt iliac stents with 30% left end-stent restenosis and 50% right end-stent restnosis(Dr. Gerrie Nordmann)  . CARDIAC CATHETERIZATION  06/11/1998   L main with 20% narrowing in distal 1/3; LAD with 1st diagonla having 70% ostial narrowing, 2nd diagonal with 40% narrowing in prox third, LIMA & RIMA widely patent; in-stent restenosis of RCIA with successful PTA and new prox SVTRCA stent for residual disease (Dr. Marella Chimes)  . CARDIAC DEFIBRILLATOR PLACEMENT  06/2005   [employer]  Vit[employer] HE - ischemic cardiomyopathy - Dr. Marella Chimes  . CARDIOVERSION [name] 11/19/2020   Procedure: CARDIOVERSION;  Surgeon: Larey Dresser, MD;  Location: [facility] ENDOSCOPY;  Service: Cardiovascular;  Laterality: [name];  . CAROTID ENDARTERECTOMY Left 01/04/12  . CORONARY ANGIOPLASTY  01/08/2004   cutting balloon atherectomy & percutaneous intervention of RCIA in-stent restenosis (Dr. Marella Chimes)  . CORONARY ARTERY BYPASS GRAFT  11/04/1985   x2 - PDA and sequential DX-OM (Dr. Redmond Pulling)  . ENDARTERECTOMY  01/04/2012   Procedure: ENDARTERECTOMY CAROTID;left  Surgeon: Hinda Lenis, MD;  Location: [place];  Service: Vascular;  Laterality: Left;  with patch angioplasty  . FUDUCIAL PLACEMENT [name] 02/23/2017   Procedure: PLACEMENT OF FUDUCIAL;  Surgeon: [name], MD;  Location: [place];  Service: Thoracic;  Laterality: [name];  . ICD GENERATOR CHANGE  05/02/2010   [employer], [employer]  . ILIAC ARTERY STENT Bilateral 03/1997   and L SFA PTA  . Iron infusion  June 16, 2012  . LEFT HEART CATHETERIZATION WITH CORONARY ANGIOGRAM [name] 07/06/2014   Procedure: LEFT HEART CATHETERIZATION WITH CORONARY ANGIOGRAM;  Surgeon: Peter M Martinique, MD;  Location: [facility] CATH LAB;  Service: Cardiovascular;  Laterality: [name];  . NM MYOCAR PERF WALL MOTION  09/2012   lexiscan myoview - mod-severe perfusion defect r/t infarct or scar w/mild periinfarct ishcemia in basal inferior, mid inferior, apical inferior, basal inferolateral & mid inferoalteral regions - EF 21% low risk scan  . POLYPECTOMY    . RENAL ARTERY STENT Left 03/24/2004   6x67m Genesis stent (Dr. RMarella Chimes  . RIGHT/LEFT HEART CATH AND CORONARY ANGIOGRAPHY [name] 12/28/2017   Procedure: RIGHT/LEFT HEART CATH AND CORONARY ANGIOGRAPHY;  Surgeon: MLarey Dresser MD;  [facility];  Service: Cardiovascular;  Laterality: [name];  . TRANSTHORACIC ECHOCARDIOGRAM  12/2012   EF 30-35; LV mod to severely dilated, mod concentric hypertrophy, severe  hypokinesis of inferolateral myocardium, moderate hypokineis of anteroseptal region, grade 1 diastolic dysfunction; mod MR; LA mod-severely dialted; RV mod dialted; RA mildly dilated; PA peak pressure 383mg  . VIDEO BRONCHOSCOPY WITH ENDOBRONCHIAL NAVIGATION [name] 02/23/2017   Procedure: VIDEO BRONCHOSCOPY WITH ENDOBRONCHIAL NAVIGATION;  Surgeon: StMelrose NakayamaMD;  [place]: [facility] Service: Thoracic;  Laterality: [name];    Social History   Socioeconomic History  . Marital status: Married    Spouse name: Not on file  . Number of children: 2  . Years of education: Not on file  . Highest education level: Not on file  Occupational History  . Occupation: retired poSolicitor Tobacco Use  . Smoking status: Former Smoker    Packs/day: 1.50    Years: 40.00    Pack years: 60.00    Types: Cigarettes, Pipe    Quit date: 12/11/2002    Years  since quitting: 18.4  . Smokeless tobacco: Never Used  . Tobacco comment: Pt reports smoking 1 pack/day before quitting in 2004  Vaping Use  . Vaping Use: Never used  Substance and Sexual Activity  . Alcohol use: No    Alcohol/week: 0.0 standard drinks  . Drug use: No  . Sexual activity: Not Currently  Other Topics Concern  . Not on file  Social History Narrative  . Not on file   Social Determinants of Health   Financial Resource Strain: Medium Risk  . Difficulty of Paying Living Expenses: Somewhat hard  Food Insecurity: Not on file  Transportation Needs: Not on file  Physical Activity: Not on file  Stress: Not on file  Social Connections: Not on file  Intimate Partner Violence: Not on file    Family History  Problem Relation Age of Onset  . Lung cancer Mother   . Cancer Mother   . Diabetes Mother   . Hypertension Mother   . Hyperlipidemia Mother   . Heart disease Mother        before age 56  . Heart attack Father   . Heart disease Father        before age 41  . Hypertension Father   . Colon cancer Sister   .  Cancer Sister   . Hypertension Sister   . Hyperlipidemia Sister   . Parkinson's disease Sister   . Diabetes Sister   . Heart disease Sister        before age 15  . Heart attack Maternal Grandmother   . Diabetes Daughter     Anti-infectives: Anti-infectives (From admission, onward)   Start     Dose/Rate Route Frequency Ordered Stop   05/10/21 0000  cefTRIAXone (ROCEPHIN) 2 g in sodium chloride 0.9 % 100 mL IVPB        2 g 200 mL/hr over 30 Minutes Intravenous Every 24 hours 05/09/21 0226     05/09/21 0400  azithromycin (ZITHROMAX) 500 mg in sodium chloride 0.9 % 250 mL IVPB        500 mg 250 mL/hr over 60 Minutes Intravenous Every 24 hours 05/09/21 0226     05/09/21 0330  cefTRIAXone (ROCEPHIN) 2 g in sodium chloride 0.9 % 100 mL IVPB        2 g 200 mL/hr over 30 Minutes Intravenous  Once 05/09/21 0230 05/09/21 0436      Current Facility-Administered Medications  Medication Dose Route Frequency Provider Last Rate Last Admin  . 0.9 %  sodium chloride infusion  250 mL Intravenous Continuous Candee Furbish, MD 10 mL/hr at 05/09/21 1318 250 mL at 05/09/21 1318  . amiodarone (PACERONE) tablet 200 mg  200 mg Per Tube Daily Jennelle Human B, NP   200 mg at 05/09/21 1031  . arformoterol (BROVANA) nebulizer solution 15 mcg  15 mcg Nebulization BID Jennelle Human B, NP   15 mcg at 05/09/21 0750  . azithromycin (ZITHROMAX) 500 mg in sodium chloride 0.9 % 250 mL IVPB  500 mg Intravenous Q24H Jennelle Human B, NP 10 mL/hr at 05/09/21 1200 Infusion Verify at 05/09/21 1200  . budesonide (PULMICORT) nebulizer solution 0.5 mg  0.5 mg Nebulization BID Jennelle Human B, NP   0.5 mg at 05/09/21 0750  . [START ON 05/10/2021] cefTRIAXone (ROCEPHIN) 2 g in sodium chloride 0.9 % 100 mL IVPB  2 g Intravenous Q24H Jennelle Human B, NP      . chlorhexidine gluconate (MEDLINE KIT) (PERIDEX) 0.12 % solution 15  mL  15 mL Mouth Rinse BID Jennelle Human B, NP   15 mL at 05/09/21 0955  . Chlorhexidine Gluconate  Cloth 2 % PADS 6 each  6 each Topical Q0600 Tyna Jaksch, MD   6 each at 05/09/21 0100  . dexmedetomidine (PRECEDEX) 400 MCG/100ML (4 mcg/mL) infusion  0.4-1.2 mcg/kg/hr Intravenous Titrated Jennelle Human B, NP 13 mL/hr at 05/09/21 1506 0.4 mcg/kg/hr at 05/09/21 1506  . docusate sodium (COLACE) capsule 100 mg  100 mg Oral BID PRN Jennelle Human B, NP      . fentaNYL 2571mg in NS 2522m(1059mml) infusion-PREMIX  0-200 mcg/hr Intravenous Continuous SimJennelle Human NP 15 mL/hr at 05/09/21 1200 150 mcg/hr at 05/09/21 1200  . heparin ADULT infusion 100 units/mL (25000 units/250m8m1,850 Units/hr Intravenous Continuous MeyeKris MoutonH 18.5 mL/hr at 05/09/21 1306 1,850 Units/hr at 05/09/21 1306  . insulin aspart (novoLOG) injection 0-15 Units  0-15 Units Subcutaneous Q4H SimpJennelle HumanNP   5 Units at 05/09/21 1211  . insulin glargine (LANTUS) injection 8 Units  8 Units Subcutaneous BID SmitCandee Furbish      . lactated ringers infusion   Intravenous Continuous SmitCandee Furbish 125 mL/hr at 05/09/21 1200 Infusion Verify at 05/09/21 1200  . levothyroxine (SYNTHROID) tablet 50 mcg  50 mcg Per Tube Q0600 SimpJennelle HumanNP   50 mcg at 05/09/21 1031  . MEDLINE mouth rinse  15 mL Mouth Rinse 10 times per day SimpJennelle HumanNP   15 mL at 05/09/21 1320  . methylPREDNISolone sodium succinate (SOLU-MEDROL) 40 mg/mL injection 40 mg  40 mg Intravenous Q12H SmitCandee Furbish   40 mg at 05/09/21 1513  . norepinephrine (LEVOPHED) 4mg 70m250mL 16mix infusion  0-40 mcg/min Intravenous Titrated Smith,Candee Furbish5 mL/hr at 05/09/21 1347 20 mcg/min at 05/09/21 1347  . pantoprazole (PROTONIX) injection 40 mg  40 mg Intravenous Daily SimpsoJennelle Human   40 mg at 05/09/21 1031  . polyethylene glycol (MIRALAX / GLYCOLAX) packet 17 g  17 g Oral Daily PRN SimpsoJennelle Human      . vasopressin (PITRESSIN) 20 Units in sodium chloride 0.9 % 100 mL infusion-*FOR SHOCK*  0.04 Units/min  Intravenous Continuous Smith,Candee Furbish2 mL/hr at 05/09/21 1512 0.04 Units/min at 05/09/21 1512     Objective: Vital signs in last 24 hours: BP (!) 98/32   Pulse 85   Temp 99 F (37.2 C)   Resp (!) 24   Ht 5' 7"  (1.702 m)   Wt 130 kg   SpO2 99%   BMI 44.89 kg/m   Intake/Output from previous day: 05/29 0701 - 05/30 0700 In: 446.6 [I.V.:61.9; IV Piggyback:384.7] Out: -  Intake/Output this shift: Total I/O In: 511.8 [I.V.:482.3; IV Piggyback:29.6] Out: 0    Physical Exam Vitals reviewed.  Constitutional:      Appearance: He is obese.     Comments: On vent  Abdominal:     Comments: Soft, morbidly obese.   Genitourinary:    Comments: Phimotic foreskin with pinhole opening and penile retraction with the large panniculus.     Lab Results:  Results for orders placed or performed during the hospital encounter of 04/14/2021 (from the past 24 hour(s))  Resp Panel by RT-PCR (Flu A&B, Covid) Nasopharyngeal Swab     Status: None   Collection Time: 05/06/2021 11:18 PM   Specimen: Nasopharyngeal Swab; Nasopharyngeal(NP) swabs in vial transport medium  Result Value Ref Range   SARS Coronavirus 2 by RT PCR NEGATIVE NEGATIVE   Influenza A by PCR NEGATIVE NEGATIVE   Influenza B by PCR NEGATIVE NEGATIVE  CBC with Differential/Platelet     Status: Abnormal   Collection Time: 05/01/2021 11:21 PM  Result Value Ref Range   WBC 14.8 (H) 4.0 - 10.5 K/uL   RBC 5.32 4.22 - 5.81 MIL/uL   Hemoglobin 16.5 13.0 - 17.0 g/dL   HCT 53.3 (H) 39.0 - 52.0 %   MCV 100.2 (H) 80.0 - 100.0 fL   MCH 31.0 26.0 - 34.0 pg   MCHC 31.0 30.0 - 36.0 g/dL   RDW 13.3 11.5 - 15.5 %   Platelets 253 150 - 400 K/uL   nRBC 0.0 0.0 - 0.2 %   Neutrophils Relative % 59 %   Neutro Abs 8.8 (H) 1.7 - 7.7 K/uL   Lymphocytes Relative 30 %   Lymphs Abs 4.4 (H) 0.7 - 4.0 K/uL   Monocytes Relative 5 %   Monocytes Absolute 0.7 0.1 - 1.0 K/uL   Eosinophils Relative 3 %   Eosinophils Absolute 0.5 0.0 - 0.5 K/uL    Basophils Relative 1 %   Basophils Absolute 0.1 0.0 - 0.1 K/uL   Immature Granulocytes 2 %   Abs Immature Granulocytes 0.31 (H) 0.00 - 0.07 K/uL  Comprehensive metabolic panel     Status: Abnormal   Collection Time: 04/20/2021 11:21 PM  Result Value Ref Range   Sodium 138 135 - 145 mmol/L   Potassium 5.0 3.5 - 5.1 mmol/L   Chloride 104 98 - 111 mmol/L   CO2 23 22 - 32 mmol/L   Glucose, Bld 308 (H) 70 - 99 mg/dL   BUN 37 (H) 8 - 23 mg/dL   Creatinine, Ser 2.14 (H) 0.61 - 1.24 mg/dL   Calcium 8.1 (L) 8.9 - 10.3 mg/dL   Total Protein 6.3 (L) 6.5 - 8.1 g/dL   Albumin 3.3 (L) 3.5 - 5.0 g/dL   AST 36 15 - 41 U/L   ALT 26 0 - 44 U/L   Alkaline Phosphatase 108 38 - 126 U/L   Total Bilirubin 1.0 0.3 - 1.2 mg/dL   GFR, Estimated 31 (L) >60 mL/min   Anion gap 11 5 - 15  Troponin I (High Sensitivity)     Status: Abnormal   Collection Time: 04/12/2021 11:21 PM  Result Value Ref Range   Troponin I (High Sensitivity) 92 (H) <18 ng/L  Brain natriuretic peptide     Status: Abnormal   Collection Time: 04/19/2021 11:22 PM  Result Value Ref Range   B Natriuretic Peptide 281.2 (H) 0.0 - 100.0 pg/mL  I-Stat arterial blood gas, ED     Status: Abnormal   Collection Time: 05/09/21 12:20 AM  Result Value Ref Range   pH, Arterial 7.172 (LL) 7.350 - 7.450   pCO2 arterial 65.1 (HH) 32.0 - 48.0 mmHg   pO2, Arterial 77 (L) 83.0 - 108.0 mmHg   Bicarbonate 24.3 20.0 - 28.0 mmol/L   TCO2 26 22 - 32 mmol/L   O2 Saturation 92.0 %   Acid-base deficit 7.0 (H) 0.0 - 2.0 mmol/L   Sodium 137 135 - 145 mmol/L   Potassium 4.8 3.5 - 5.1 mmol/L   Calcium, Ion 1.16 1.15 - 1.40 mmol/L   HCT 52.0 39.0 - 52.0 %   Hemoglobin 17.7 (H) 13.0 - 17.0 g/dL   Patient temperature 95.9 F    Collection site Radial  Drawn by Operator    Sample type ARTERIAL   Lactic acid, plasma     Status: Abnormal   Collection Time: 05/09/21  1:16 AM  Result Value Ref Range   Lactic Acid, Venous 2.5 (HH) 0.5 - 1.9 mmol/L  Magnesium      Status: Abnormal   Collection Time: 05/09/21  1:16 AM  Result Value Ref Range   Magnesium 3.2 (H) 1.7 - 2.4 mg/dL  Phosphorus     Status: Abnormal   Collection Time: 05/09/21  1:16 AM  Result Value Ref Range   Phosphorus 6.4 (H) 2.5 - 4.6 mg/dL  Procalcitonin     Status: None   Collection Time: 05/09/21  1:16 AM  Result Value Ref Range   Procalcitonin <0.10 ng/mL  Cortisol     Status: None   Collection Time: 05/09/21  1:16 AM  Result Value Ref Range   Cortisol, Plasma 32.2 ug/dL  Protime-INR     Status: None   Collection Time: 05/09/21  1:16 AM  Result Value Ref Range   Prothrombin Time 14.9 11.4 - 15.2 seconds   INR 1.2 0.8 - 1.2  MRSA PCR Screening     Status: None   Collection Time: 05/09/21  1:33 AM   Specimen: Nasopharyngeal  Result Value Ref Range   MRSA by PCR NEGATIVE NEGATIVE  I-STAT 7, (LYTES, BLD GAS, ICA, H+H)     Status: Abnormal   Collection Time: 05/09/21  3:30 AM  Result Value Ref Range   pH, Arterial 7.215 (L) 7.350 - 7.450   pCO2 arterial 55.8 (H) 32.0 - 48.0 mmHg   pO2, Arterial 60 (L) 83.0 - 108.0 mmHg   Bicarbonate 22.6 20.0 - 28.0 mmol/L   TCO2 24 22 - 32 mmol/L   O2 Saturation 84.0 %   Acid-base deficit 6.0 (H) 0.0 - 2.0 mmol/L   Sodium 140 135 - 145 mmol/L   Potassium 4.8 3.5 - 5.1 mmol/L   Calcium, Ion 1.17 1.15 - 1.40 mmol/L   HCT 54.0 (H) 39.0 - 52.0 %   Hemoglobin 18.4 (H) 13.0 - 17.0 g/dL   Patient temperature 98.6 F    Collection site Radial    Drawn by RT    Sample type ARTERIAL   Glucose, capillary     Status: Abnormal   Collection Time: 05/09/21  3:32 AM  Result Value Ref Range   Glucose-Capillary 304 (H) 70 - 99 mg/dL  Lactic acid, plasma     Status: Abnormal   Collection Time: 05/09/21  4:09 AM  Result Value Ref Range   Lactic Acid, Venous 2.1 (HH) 0.5 - 1.9 mmol/L  Basic metabolic panel     Status: Abnormal   Collection Time: 05/09/21  4:09 AM  Result Value Ref Range   Sodium 137 135 - 145 mmol/L   Potassium 4.5 3.5 - 5.1  mmol/L   Chloride 107 98 - 111 mmol/L   CO2 18 (L) 22 - 32 mmol/L   Glucose, Bld 289 (H) 70 - 99 mg/dL   BUN 41 (H) 8 - 23 mg/dL   Creatinine, Ser 2.21 (H) 0.61 - 1.24 mg/dL   Calcium 8.5 (L) 8.9 - 10.3 mg/dL   GFR, Estimated 30 (L) >60 mL/min   Anion gap 12 5 - 15  CBC     Status: Abnormal   Collection Time: 05/09/21  4:09 AM  Result Value Ref Range   WBC 13.6 (H) 4.0 - 10.5 K/uL   RBC 5.55 4.22 - 5.81 MIL/uL  Hemoglobin 17.2 (H) 13.0 - 17.0 g/dL   HCT 54.3 (H) 39.0 - 52.0 %   MCV 97.8 80.0 - 100.0 fL   MCH 31.0 26.0 - 34.0 pg   MCHC 31.7 30.0 - 36.0 g/dL   RDW 13.5 11.5 - 15.5 %   Platelets 184 150 - 400 K/uL   nRBC 0.0 0.0 - 0.2 %  Troponin I (High Sensitivity)     Status: Abnormal   Collection Time: 05/09/21  4:31 AM  Result Value Ref Range   Troponin I (High Sensitivity) 276 (HH) <18 ng/L  Culture, respiratory (tracheal aspirate)     Status: None (Preliminary result)   Collection Time: 05/09/21  5:07 AM   Specimen: Tracheal Aspirate; Respiratory  Result Value Ref Range   Specimen Description TRACHEAL ASPIRATE    Special Requests NONE    Gram Stain      MODERATE WBC PRESENT,BOTH PMN AND MONONUCLEAR FEW GRAM POSITIVE COCCI IN CHAINS Performed at [facility], [address], [place],  [address]    Culture PENDING    Report Status PENDING   Glucose, capillary     Status: Abnormal   Collection Time: 05/09/21  7:39 AM  Result Value Ref Range   Glucose-Capillary 214 (H) 70 - 99 mg/dL  Heparin level (unfractionated)     Status: Abnormal   Collection Time: 05/09/21  9:35 AM  Result Value Ref Range   Heparin Unfractionated 0.87 (H) 0.30 - 0.70 IU/mL  APTT     Status: Abnormal   Collection Time: 05/09/21  9:35 AM  Result Value Ref Range   aPTT 46 (H) 24 - 36 seconds  Glucose, capillary     Status: Abnormal   Collection Time: 05/09/21 11:44 AM  Result Value Ref Range   Glucose-Capillary 204 (H) 70 - 99 mg/dL    BMET Recent Labs    05/07/2021 2321  05/09/21 0020 05/09/21 0330 05/09/21 0409  NA 138   < > 140 137  K 5.0   < > 4.8 4.5  CL 104  --   --  107  CO2 23  --   --  18*  GLUCOSE 308*  --   --  289*  BUN 37*  --   --  41*  CREATININE 2.14*  --   --  2.21*  CALCIUM 8.1*  --   --  8.5*   < > = values in this interval not displayed.   PT/INR Recent Labs    05/09/21 0116  LABPROT 14.9  INR 1.2   ABG Recent Labs    05/09/21 0020 05/09/21 0330  PHART 7.172* 7.215*  HCO3 24.3 22.6    Studies/Results: DG Chest 1 View  Result Date: 05/09/2021 CLINICAL DATA:  75 year old male with history of central line placement. EXAM: CHEST  1 VIEW COMPARISON:  Chest x-ray 05/09/2021. FINDINGS: Nasogastric tube extending into the mid body of the stomach. Widespread patchy multifocal interstitial and airspace disease scattered throughout the lungs bilaterally (right greater than left) with relative sparing in the left upper lobe. Small bilateral pleural effusions. Heart size is mildly enlarged. The patient is rotated to the left on today's exam, resulting in distortion of the mediastinal contours and reduced diagnostic sensitivity and specificity for mediastinal pathology. Status post median sternotomy for CABG. Left-sided biventricular pacemaker/AICD. IMPRESSION: 1. Support apparatus and postoperative changes, as above. 2. Multilobar bilateral pneumonia redemonstrated, as above. 3. Cardiomegaly. Electronically Signed   By: Vinnie Langton M.D.   On: 05/09/2021 13:48  US RENAL  Result Date: 05/09/2021 CLINICAL DATA:  Acute renal injury EXAM: RENAL / URINARY TRACT ULTRASOUND COMPLETE COMPARISON:  None. FINDINGS: Right Kidney: Renal measurements: 10.6 x 5.8 x 5.3 cm. = volume: 171 mL. 6 mm cortical cyst is noted in the upper pole. No mass lesion or hydronephrosis is noted. Left Kidney: Renal measurements: 9.5 x 5.1 x 4.4 cm. = volume: 112 mL. Echogenicity within normal limits. No mass or hydronephrosis visualized. Bladder: Appears normal for  degree of bladder distention. Other: None. IMPRESSION: Tiny right renal cyst.  No other focal abnormality is noted. Electronically Signed   By: Inez Catalina M.D.   On: 05/09/2021 15:31   DG Chest Port 1 View  Result Date: 05/09/2021 CLINICAL DATA:  Respiratory failure EXAM: PORTABLE CHEST 1 VIEW COMPARISON:  04/11/2021 FINDINGS: Endotracheal tube terminates 3.5 cm above the carina. Radiation changes in the central right upper lobe with fiducial markers. Multifocal patchy opacities, right lower lobe predominant, favoring multifocal pneumonia over interstitial edema. No definite pleural effusions. No pneumothorax. Cardiomegaly. Postsurgical changes related to prior CABG. Left subclavian ICD. Enteric tube courses into the stomach. IMPRESSION: Endotracheal tube terminates 3.5 cm above the carina. Multifocal patchy opacities, right lower lobe predominant, favoring multifocal pneumonia over interstitial edema. Radiation changes in the right upper lobe. Additional support apparatus as above. Electronically Signed   By: Julian Hy M.D.   On: 05/09/2021 08:14   DG Chest Portable 1 View  Result Date: 04/26/2021 CLINICAL DATA:  COPD, unresponsive, intubated EXAM: PORTABLE CHEST 1 VIEW COMPARISON:  03/23/2020 FINDINGS: Two supine frontal views of the chest are obtained. Endotracheal tube overlies tracheal air column, tip midway between thoracic inlet and carina. Enteric catheter passes below diaphragm tip excluded by collimation. Multi lead pacer/AICD is unchanged. Cardiac silhouette is enlarged, accentuated by portable AP technique and supine positioning. Post therapeutic changes are seen within the upper lobes. There is diffuse bilateral ground-glass airspace disease, favor edema over pneumonia. No large effusion. No pneumothorax. IMPRESSION: 1. Support devices as above. 2. Widespread bilateral ground-glass airspace disease, favor edema over infection. 3. Stable post therapeutic changes within the upper lobes.  Electronically Signed   By: Randa Ngo M.D.   On: 04/26/2021 23:43   I have reviewed the hospital records and our office notes.   Procedure:  He was prepped with betadine and I initially attempted to dilate the phimotic ring with male sounds but after initial dilation to 8 french I had to use a hemostat to further spread the ring.  I was then able to pass an 22f male sound into the meatus and distal urethral.  I was note able to pass a 119fstrainght foley but was able to successfully pass a 1447foude with return of clear urine.  The balloon was filled with 51m71m sterile fluid and the foley was placed to drainage.   Assessment/Plan: Severe phimosis with difficult foley placement.   I dilated the phimotic ring and was able to place a [id number].   Continue catheter drainage until not needed for medical management.  Depending on his recovery, he could be considered for a dorsal slit procedure to expose the meatus at a later date.  BPH with BOO.  He has been on chronic finasteride.  Resume when possible.         No follow-ups on file.    CC: Dr. DanieIna Homes Cannan Beeck Irine Seal/2022 [phone]

## 2021-05-09 NOTE — Progress Notes (Signed)
05/09/2021   I have seen and evaluated the patient for respiratory failure.  S:  Awake on vent, in pain, fentanyl increased. Having thick secretions from ETT.  O: Blood pressure (!) 110/58, pulse 90, temperature 98.3 F (36.8 C), temperature source Oral, resp. rate (!) 24, height 5\' 7"  (1.702 m), weight 130 kg, SpO2 100 %.  Chronically ill man on vent Lungs with wheezing L>R Moves all 4 ext to command Not much edema  A:  Acute hypercapneic and hypoxemic respirator failure due to some combination of suspected aspiration and bronchospasm Acute kidney injury  Hx NSLC with 3 occurrences and chronic changes on CXR Hx of CHF  P:  - Increase sedation - Start LR - Check renal US - Let rest on vent today, wean FiO2 as able  Patient critically ill due to respiratory failure Interventions to address this today vent titration Risk of deterioration without these interventions is high  I personally spent 35 minutes providing critical care not including any separately billable procedures  Erskine Emery MD [facility] Prefer epic messenger for cross cover needs If after hours, please call E-link

## 2021-05-09 NOTE — Progress Notes (Signed)
eLink Physician-Brief Progress Note Patient Name: [name] DOB: 04/04/1946 MRN: [id number]   Date of Service  05/09/2021  HPI/Events of Note  Brief new admit note:  admitted to ICU on Vent for AHRF.  Hx of Morbid obesity, HFrEF (12/24/20 EF 40-45%, G2DD, mildly reduced systolic RV, mild MR), VT on amio/ ICD, CAD s/p CABG 1996, LBBB, HLD, COPD, OSA on CPAP, AAA (s/p repair 2015), NSCL lung CA s/p XRT 2018, anemia, BPH, DMT2, GERD, hiatal hernia, PVD, tremor  Camera: Morbidly obese, getting foley. VS: 80's HR, 134/108, sats 95% on PRVC 530 ( < 8 ml/ibw)/5/21/100%. On fenta gtt.   Data: Reviewed.  EKG HR 82, Qtc 542. Pace maker , RBBB. EF 45% from 12/2020.  7.11/54/77, Hg 17.7 K 5, Cr 2.14 Wbc 14K Bg 305 BNP 281, trop 92 CxR: ET in place. Basal air space could be CHF vs pneumonitis. PM status. Atelectasis, RML segment? Vs RUL.   A/P 1. Acute on chronic type 2 failure from COPD/OSA/low EF CHF exacerbation. Acute on CKD. CAD. PM status. 2. DM type 2    Continue current Rx a s per CCM team on bax. Follow cultures - SAT and SBT daily in AM as tolerated - VAP bundle - lung protective ventilation.wean Vt to goals  VTE: on Heparin CBG: goal < 180 - follow troponin.   eICU Interventions       Intervention Category Major Interventions: Respiratory failure - evaluation and management Evaluation Type: New Patient Evaluation  Elmer Sow 05/09/2021, 1:00 AM

## 2021-05-09 NOTE — Progress Notes (Signed)
[place] for heparin Indication: atrial fibrillation  Allergies  Allergen Reactions  . Tussionex Pennkinetic Er [Hydrocod Polst-Cpm Polst Er] Other (See Comments)    UNSPECIFIED REACTION  > "caused prostate problems"  . Ace Inhibitors Cough    Patient Measurements: Height: 5\' 7"  (170.2 cm) Weight: 130 kg (286 lb 9.6 oz) IBW/kg (Calculated) : 66.1 Heparin Dosing Weight: 96.8 kg  Vital Signs: Temp: 99 F (37.2 C) (05/30 1152) Temp Source: Oral (05/30 0809) BP: 97/35 (05/30 1140) Pulse Rate: 78 (05/30 1140)  Labs: Recent Labs    05/07/2021 2321 05/09/21 0020 05/09/21 0116 05/09/21 0330 05/09/21 0409 05/09/21 0431 05/09/21 0935  HGB 16.5 17.7*  --  18.4* 17.2*  --   --   HCT 53.3* 52.0  --  54.0* 54.3*  --   --   PLT 253  --   --   --  184  --   --   APTT  --   --   --   --   --   --  46*  LABPROT  --   --  14.9  --   --   --   --   INR  --   --  1.2  --   --   --   --   HEPARINUNFRC  --   --   --   --   --   --  0.87*  CREATININE 2.14*  --   --   --  2.21*  --   --   TROPONINIHS 92*  --   --   --   --  276*  --     Estimated Creatinine Clearance: 36.9 mL/min (A) (by C-G formula based on SCr of 2.21 mg/dL (H)).   Medical History: Past Medical History:  Diagnosis Date  . AAA (abdominal aortic aneurysm) ([place])    followed by Dr. Bridgett Larsson  . Adenomatous colon polyp 01/2004  . Anemia    hx  . Automatic implantable cardioverter-defibrillator in situ   . AVM (arteriovenous malformation)   . BPH (benign prostatic hypertrophy)   . CAD (coronary artery disease)    s/p CABGx2 in 1996  . Carotid artery stenosis    LCEA - Dr. Bridgett Larsson in 2013  . CHF (congestive heart failure) ([place])   . Complication of anesthesia    claustrophobic, unabe to lie on back more than 4 hours at time due to back  . COPD (chronic obstructive pulmonary disease) ([place])   . Diabetes mellitus    DIET CONTROLLED- pt states that this was a misdiagnosis, he was treated  while in the hospital  but returned home, loss a massive amount of weight and he has not had a problem with his blood sugar since. States everything has been normal for about 3 years.  . Diverticulosis   . Dyspnea   . Dysrhythmia    ICD-defibrillator  . Fatigue   . GERD (gastroesophageal reflux disease)   . H/O hiatal hernia   . History of radiation therapy 04/09/17-04/17/17   SBRT right lung 54 Gy in 3 fractions  . HLD (hyperlipidemia)   . Hypertension   . Hypothyroidism   . Ischemic cardiomyopathy   . Myocardial infarction ([place])   . 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 OSA on CPAP    AHI durign total sleep 14.69/hr, during REM 50.91/hr  . Peripheral vascular disease ([facility])    LCEA, L renal artery stent, bilat iliac stents,  R SFA stenosis  . Tremor   . Ventricular tachycardia ([place]) 09/09/2014   Amiodarone was started after appropriate defibrillator shocks for ventricular tachycardia in October 2008    Medications:  Medications Prior to Admission  Medication Sig Dispense Refill Last Dose  . albuterol (PROVENTIL HFA;VENTOLIN HFA) 108 (90 BASE) MCG/ACT inhaler Inhale 2 puffs into the lungs every 6 (six) hours as needed for wheezing or shortness of breath. 3 Inhaler 4 Past Month at Unknown time  . albuterol (PROVENTIL) (2.5 MG/3ML) 0.083% nebulizer solution USE 1 VIAL IN NEBULIZER 4 TIMES DAILY (Patient taking differently: Take 2.5 mg by nebulization 4 (four) times daily.) 120 mL 11 04/24/2021 at Unknown time  . amiodarone (PACERONE) 200 MG tablet Take 1 tablet (200 mg total) by mouth daily. 45 tablet 3 05/07/2021  . atorvastatin (LIPITOR) 80 MG tablet TAKE 1 TABLET BY MOUTH EVERY DAY (Patient taking differently: Take 80 mg by mouth daily.) 30 tablet 1 05/07/2021  . ATROVENT HFA 17 MCG/ACT inhaler Inhale 2 puffs into the lungs every 4 (four) hours as needed for wheezing.   12 Past Month at Unknown time  . carvedilol (COREG) 12.5 MG tablet Take 1 tablet (12.5 mg total) by mouth 2 (two) times daily  with a meal. 60 tablet 11 05/07/2021  . dapagliflozin propanediol (FARXIGA) 10 MG TABS tablet Take 1 tablet (10 mg total) by mouth daily before breakfast. 30 tablet 11 05/07/2021  . diazepam (VALIUM) 5 MG tablet Take 1 tablet (5 mg total) by mouth every 8 (eight) hours as needed for anxiety. 1-2 tabs every 8 hours if needed for tremor 30 tablet 5 unk  . ezetimibe (ZETIA) 10 MG tablet TAKE 1 TABLET BY MOUTH  DAILY (Patient taking differently: Take 10 mg by mouth daily.) 90 tablet 3 05/07/2021  . finasteride (PROSCAR) 5 MG tablet TAKE 1 TABLET BY MOUTH  DAILY (Patient taking differently: Take 5 mg by mouth daily.) 90 tablet 3 05/07/2021  . fluticasone (FLONASE) 50 MCG/ACT nasal spray Instill 1 spray in each  nostril daily (Patient taking differently: Place 1 spray into both nostrils daily as needed for allergies.) 32 g 3 unk  . Fluticasone-Umeclidin-Vilant (TRELEGY ELLIPTA) 100-62.5-25 MCG/INH AEPB Inhale 1 puff into the lungs daily. Rinse mouth 60 each 12 05/05/2021 at Unknown time  . icosapent Ethyl (VASCEPA) 1 g capsule TAKE 2 CAPSULES (2 G TOTAL) BY MOUTH 2 (TWO) TIMES A DAY. (Patient taking differently: Take 2 g by mouth 2 (two) times daily.) 120 capsule 11 05/07/2021  . isosorbide mononitrate (IMDUR) 60 MG 24 hr tablet TAKE 1.5 TABLETS (90 MG TOTAL) BY MOUTH DAILY. (Patient taking differently: Take 90 mg by mouth daily.) 90 tablet 1 05/07/2021  . levothyroxine (SYNTHROID) 50 MCG tablet TAKE 1 TABLET BY MOUTH  DAILY BEFORE BREAKFAST (Patient taking differently: Take 50 mcg by mouth daily before breakfast.) 90 tablet 1 05/07/2021  . Multiple Vitamins-Minerals (MULTIVITAMIN WITH MINERALS) tablet Take 1 tablet by mouth daily.   05/07/2021  . Multiple Vitamins-Minerals (PRESERVISION AREDS 2 PO) Take 1 capsule by mouth at bedtime.   05/07/2021  . nitroGLYCERIN (NITROSTAT) 0.4 MG SL tablet PLACE 1 TABLET UNDER THE TONGUE EVERY 5 MINUTES AS NEEDED FOR CHEST PAIN. (Patient taking differently: Place 0.4 mg under the  tongue every 5 (five) minutes as needed for chest pain.) 25 tablet 3 unk  . NON FORMULARY See admin instructions. CPAP At bedtime And during the day as needed when napping     . OXYGEN Inhale 2.5-3 L into  the lungs continuous.     . potassium chloride SA (KLOR-CON M20) 20 MEQ tablet Take 1.5 tablets (30 mEq total) by mouth daily. 135 tablet 3   . Rivaroxaban (XARELTO) 15 MG TABS tablet Take 1 tablet (15 mg total) by mouth daily with supper. 30 tablet 11 05/07/2021  . sacubitril-valsartan (ENTRESTO) 49-51 MG Take 1 tablet by mouth 2 (two) times daily.   05/07/2021  . sacubitril-valsartan (ENTRESTO) 97-103 MG Take 1 tablet by mouth 2 (two) times daily. 180 tablet 3   . spironolactone (ALDACTONE) 25 MG tablet TAKE 1 TABLET BY MOUTH  DAILY (Patient taking differently: Take 25 mg by mouth daily.) 90 tablet 3 05/07/2021  . torsemide (DEMADEX) 20 MG tablet Take 3 tablets (60 mg total) by mouth in the morning AND 2 tablets (40 mg total) every evening. 150 tablet 11 05/07/2021  . traMADol (ULTRAM) 50 MG tablet Take 1 tablet (50 mg total) by mouth every 6 (six) hours as needed. (Patient taking differently: Take 50 mg by mouth every 6 (six) hours as needed for moderate pain.) 75 tablet 3 Past Month at Unknown time    Assessment: 75 yo man to start heparin while xarelto on hold with VDRF and AKI.  Last dose xarelto 5/28.   -aPTT below goal  Goal of Therapy:  APTT 66-102 sec Heparin level 0.3-0.7 units/ml Monitor platelets by anticoagulation protocol: Yes   Plan:  -Increase heparin to 1850 units/hr -Heparin level in 8 hours and daily wth CBC daily  Hildred Laser, PharmD Clinical Pharmacist **Pharmacist phone directory can now be found on [email or web address] ([email or web address]).  Listed under [place].

## 2021-05-09 NOTE — Progress Notes (Signed)
Respiratory culture obtained and sent to the lab at this time.

## 2021-05-09 NOTE — H&P (Signed)
NAME:  [name], MRN:  [id number], DOB:  1946/06/08, LOS: 0 ADMISSION DATE:  04/20/2021, CONSULTATION DATE:  05/09/2021 REFERRING MD:  Dr. Randal Buba, CHIEF COMPLAINT:  SOB  History of Present Illness:   HPI obtained from medical chart review as patient is currently intubated and sedated on mechanical ventilation.  No family at bedside.  42 yoM with past medical hx as below presenting from home with complaints of SOB/ respiratory distress.  Treated by EMS with duonebs, epi, solumedrol, and mag without improvement, requiring BVM.  On arrival to ER, patient required intubation as he was peri- arrest, unresponsive, bradycardic, temp 95.9, hypotensive, and apneic with diffuse apical wheeze requiring emergent intubation on arrival.  Noted to have pink frothy secretions on intubation. S/p lasix 40 mg.  Post intubation hypotensive resolved.  Placed on fentanyl gtt for sedation.   Labs significant for WBC 14.8, glucose 308, BUN 37, sCr 2.14 (previously 1.38 on 5/20), low albumin and protein, Mag 3.2, BNP 281, trop hs 92.  EKG noted for paced atrial rhythm.  CXR with bilateral ground glass suggesting edema vs infection.  Post intubation ABG 7.172/ 65/ 77/ 24.3.  Rate increased to 24, limited due to auto-peeping.  PCCM called for admit.   Pertinent  Medical History  Morbid obesity, HFrEF (12/24/20 EF 40-45%, G2DD, PAF, mildly reduced systolic RV, mild MR), VT on amio/ ICD, CAD s/p CABG 1996, LBBB, HLD, COPD, OSA on CPAP, AAA (s/p repair 2015), NSCL lung CA s/p XRT 2018, anemia, BPH, DMT2, GERD, hiatal hernia, PVD, tremor  Significant Hospital Events: Including procedures, antibiotic start and stop dates in addition to other pertinent events   . 5/30 admitted for respiratory failure/ intubated, empiric CAP coverage/ duresis  Interim History / Subjective:  Sedation held on arrival to ICU- reportedly following all commands, MAE Bedside RN unable to place foley- unable to retract constricted foreskin and  very small meatus, bladder scan with 59 ml  Objective   Blood pressure 134/76, pulse 84, temperature (!) 95.9 F (35.5 C), temperature source Temporal, resp. rate (!) 24, height 5\' 7"  (1.702 m), SpO2 98 %.    Vent Mode: PRVC FiO2 (%):  [100 %] 100 % Set Rate:  [20 bmp] 20 bmp Vt Set:  [530 mL] 530 mL PEEP:  [55 cmH20] 55 cmH20 Plateau Pressure:  [32 cmH20] 32 cmH20  No intake or output data in the 24 hours ending 05/09/21 0015 There were no vitals filed for this visit.  Examination: General:  Critically ill morbidly obese appearing elderly male sedated on MV HEENT: MM pink/moist, pupils 3/reactive, ETT/OGT Neuro: opens eyes to verbal, intermittently f/c, MAE CV: paced PULM:  MV supported breaths, diffuse insp/ exp wheeze with poor air movement, frothy pink secretions, Pplat 22, PIP 28 GI: obese, bs +, NT Extremities: warm/dry, +3 LE edema  Skin: no rashes  Labs/imaging that I havepersonally reviewed  (right click and "Reselect all SmartList Selections" daily)  CBC, CMET, mag, phos, ABG CXR, ABG  Resolved Hospital Problem list    Assessment & Plan:   Acute hypoxic/ hypercarbic respiratory failure ddx including pulmonary edema with HF exacerbation, +/- CAP, and AECOPD Hx OSA -Continue MV support, 8cc/kg IBW with goal Pplat <30 and DP<15  - Rate 24, limited given auto-peeping -VAP prevention protocol/ PPI -PAD protocol for sedation> fentanyl gtt, consider adding precedex if needed, for RASS goal 0/-1, w/ bowel regimen -wean FiO2 as able for SpO2 >92%  -daily SAT & SBT - solumedrol 40 mg q 6,  duonebs q 4, brovanna and pulmicort nebs - s/p lasix in ER - send RVP, trach asp, check PCT - empiric CAP coverage for now   Acute exacerbation of HFrEF (12/24/20 EF 40-45%, G2DD, mildly reduced systolic RV, mild MR) Hx of VT on amio w/ICD Hx CAD s/p CABG 1996  Hx LBBB HTN PAF - telemetry monitoring - consider HF team consult in am, patient of [name] - trend troponin and  checking BNP - s/p lasix 40mg  in ER, will likely need repeat lasix dose, BMET first and see if BP tolerates additional dosing, borderline hypotensive in ER - pending lactate  - TTE in am  - holding home imdur, coreg, entresto, torsemide, spironolactone - continue amiodarone 200mg  daily - hold home Xarelto, has bled score > 3, will start heparin gtt no bolus  AKI on CKD 3b - unable to place foley due to anatomy, will need urology in am - bladder scan q 4hrs - repeat BMET at 0500 - Trend BMP / urinary output - Replace electrolytes as indicated - Avoid nephrotoxic agents, ensure adequate renal perfusion  Leukocytosis, hypothermic on arrival - pan culture - RVP - empiric CAP coverage - checking PCT   Hypothyroidism - continue levothyroxine 50 mcg daily per tube  Hyperglycemia, possible exacerbated by steroids/ stress response - check HA1c, previous ok at 5.5 07/14/2020 - SSI moderate  BPH - hold home proscar  Morbid obesity, BMI 44.89 kg/m2 - weight loss counseling when appropriate   Best practice (right click and "Reselect all SmartList Selections" daily)  Diet:  NPO, start TF in am if not extubated Pain/Anxiety/Delirium protocol (if indicated): Yes (RASS goal -1) VAP protocol (if indicated): Yes DVT prophylaxis: Systemic AC GI prophylaxis: PPI Glucose control:  SSI Yes Central venous access:  [name] Arterial line:  [name] Foley:  [name] Mobility:  bed rest  PT consulted: [name] Last date of multidisciplinary goals of care discussion [pending] Code Status:  full code Disposition: admit ICU  Labs   CBC: Recent Labs  Lab 04/10/2021 2321  WBC 14.8*  NEUTROABS 8.8*  HGB 16.5  HCT 53.3*  MCV 100.2*  PLT 749    Basic Metabolic Panel: No results for input(s): NA, K, CL, CO2, GLUCOSE, BUN, CREATININE, CALCIUM, MG, PHOS in the last 168 hours. GFR: CrCl cannot be calculated (Unknown ideal weight.). Recent Labs  Lab 05/06/2021 2321  WBC 14.8*    Liver Function Tests: No  results for input(s): AST, ALT, ALKPHOS, BILITOT, PROT, ALBUMIN in the last 168 hours. No results for input(s): LIPASE, AMYLASE in the last 168 hours. No results for input(s): AMMONIA in the last 168 hours.  ABG    Component Value Date/Time   PHART 7.321 (L) 12/28/2017 1500   PHART 7.315 (L) 12/28/2017 1500   PCO2ART 49.7 (H) 12/28/2017 1500   PCO2ART 49.3 (H) 12/28/2017 1500   PO2ART 44.0 (L) 12/28/2017 1500   PO2ART 40.0 (LL) 12/28/2017 1500   HCO3 25.7 12/28/2017 1500   HCO3 25.1 12/28/2017 1500   TCO2 26 11/19/2020 1004   ACIDBASEDEF 1.0 12/28/2017 1500   ACIDBASEDEF 2.0 12/28/2017 1500   O2SAT 75.0 12/28/2017 1500   O2SAT 70.0 12/28/2017 1500     Coagulation Profile: No results for input(s): INR, PROTIME in the last 168 hours.  Cardiac Enzymes: No results for input(s): CKTOTAL, CKMB, CKMBINDEX, TROPONINI in the last 168 hours.  HbA1C: Hgb A1c MFr Bld  Date/Time Value Ref Range Status  07/14/2020 11:29 AM 5.5 <5.7 % of total Hgb Final  Comment:    For the purpose of screening for the presence of diabetes: . <5.7%       Consistent with the absence of diabetes 5.7-6.4%    Consistent with increased risk for diabetes             (prediabetes) > or =6.5%  Consistent with diabetes . This assay result is consistent with a decreased risk of diabetes. . Currently, no consensus exists regarding use of hemoglobin A1c for diagnosis of diabetes in children. . According to American Diabetes Association (ADA) guidelines, hemoglobin A1c <7.0% represents optimal control in non-pregnant diabetic patients. Different metrics may apply to specific patient populations.  Standards of Medical Care in Diabetes(ADA). Marland Kitchen   03/23/2020 11:02 AM 6.4 4.6 - 6.5 % Final    Comment:    Glycemic Control Guidelines for People with Diabetes:Non Diabetic:  <6%Goal of Therapy: <7%Additional Action Suggested:  >8%     CBG: No results for input(s): GLUCAP in the last 168 hours.  Review of  Systems:   unable  Past Medical History:  He,  has a past medical history of AAA (abdominal aortic aneurysm) ([place]), Adenomatous colon polyp (01/2004), Anemia, Automatic implantable cardioverter-defibrillator in situ, AVM (arteriovenous malformation), BPH (benign prostatic hypertrophy), CAD (coronary artery disease), Carotid artery stenosis, CHF (congestive heart failure) ([place]), Complication of anesthesia, COPD (chronic obstructive pulmonary disease) ([place]), Diabetes mellitus, Diverticulosis, Dyspnea, Dysrhythmia, Fatigue, GERD (gastroesophageal reflux disease), H/O hiatal hernia, History of radiation therapy (04/09/17-04/17/17), HLD (hyperlipidemia), Hypertension, Hypothyroidism, Ischemic cardiomyopathy, Myocardial infarction ([place]), NSCL ca (2018), OSA on CPAP, Peripheral vascular disease ([place]), Tremor, and Ventricular tachycardia ([place]) (09/09/2014).   Surgical History:   Past Surgical History:  Procedure Laterality Date  . ABDOMINAL AORTIC ENDOVASCULAR STENT GRAFT [name] 01/06/2014   Procedure: ABDOMINAL AORTIC ENDOVASCULAR STENT GRAFT- GORE; ULTRASOUND GUIDED;  Surgeon: Conrad Williamsburg, MD;  Location: [place];  Service: Vascular;  Laterality: [name];  . ANGIOPLASTY     BILATERAL  LE  W/STENTS  . BI-VENTRICULAR IMPLANTABLE CARDIOVERTER DEFIBRILLATOR UPGRADE [name] 10/09/2014   Procedure: BI-VENTRICULAR IMPLANTABLE CARDIOVERTER DEFIBRILLATOR UPGRADE;  Surgeon: Evans Lance, MD;  Location: [facility] CATH LAB;  Service: Cardiovascular;  Laterality: [name];  . BIV ICD GENERTAOR CHANGE OUT  10/09/2014   UPGRADE TO BIV        BY DR Lovena Le  . 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 Adora Fridge)  . CARDIAC CATHETERIZATION  10/03/2002   SVG sequentially to OM1 & OM2 totally occluded at ostium, SVG to PDA totally occluded within previously placed  prox vein graft stent, smal distal AAA, bialt iliac stents with 30% left end-stent restenosis and 50% right end-stent restnosis(Dr. Gerrie Nordmann)  . CARDIAC CATHETERIZATION  06/11/1998   L main with 20% narrowing in distal 1/3; LAD with 1st diagonla having 70% ostial narrowing, 2nd diagonal with 40% narrowing in prox third, LIMA & RIMA widely patent; in-stent restenosis of RCIA with successful PTA and new prox SVTRCA stent for residual disease (Dr. Marella Chimes)  . CARDIAC DEFIBRILLATOR PLACEMENT  06/2005   Guidant Vitality HE - ischemic cardiomyopathy - Dr. Marella Chimes  . CARDIOVERSION [name] 11/19/2020   Procedure: CARDIOVERSION;  Surgeon: Larey Dresser, MD;  Location: [facility] ENDOSCOPY;  Service: Cardiovascular;  Laterality: [name];  . CAROTID ENDARTERECTOMY Left 01/04/12  . CORONARY ANGIOPLASTY  01/08/2004   cutting  balloon atherectomy & percutaneous intervention of RCIA in-stent restenosis (Dr. Marella Chimes)  . CORONARY ARTERY BYPASS GRAFT  11/04/1985   x2 - PDA and sequential DX-OM (Dr. Redmond Pulling)  . ENDARTERECTOMY  01/04/2012   Procedure: ENDARTERECTOMY CAROTID;left  Surgeon: Hinda Lenis, MD;  Location: [place];  Service: Vascular;  Laterality: Left;  with patch angioplasty  . FUDUCIAL PLACEMENT [name] 02/23/2017   Procedure: PLACEMENT OF FUDUCIAL;  Surgeon: [name], MD;  Location: [place];  Service: Thoracic;  Laterality: [name];  . ICD GENERATOR CHANGE  05/02/2010   [employer], [employer]  . ILIAC ARTERY STENT Bilateral 03/1997   and L SFA PTA  . Iron infusion  June 16, 2012  . LEFT HEART CATHETERIZATION WITH CORONARY ANGIOGRAM [name] 07/06/2014   Procedure: LEFT HEART CATHETERIZATION WITH CORONARY ANGIOGRAM;  Surgeon: Peter M Martinique, MD;  Location: [facility] C[facility] LAB;  Service: Cardiovascular;  Laterality: [name];  . NM MYOCAR PERF WALL MOTION  09/2012   lexiscan myoview - mod-severe perfusion defect r/t infarct or scar w/mild periinfarct ishcemia in basal inferior, mid inferior, apical inferior, basal  inferolateral & mid inferoalteral regions - EF 21% low risk scan  . POLYPECTOMY    . RENAL ARTERY STENT Left 03/24/2004   6x75mm Genesis stent (Dr. Marella Chimes)  . RIGHT/LEFT HEART CATH AND CORONARY ANGIOGRAPHY [name] 12/28/2017   Procedure: RIGHT/LEFT HEART CATH AND CORONARY ANGIOGRAPHY;  Surgeon: Larey Dresser, MD;  Location: [place] CV LAB;  Service: Cardiovascular;  Laterality: [name];  . TRANSTHORACIC ECHOCARDIOGRAM  12/2012   EF 30-35; LV mod to severely dilated, mod concentric hypertrophy, severe hypokinesis of inferolateral myocardium, moderate hypokineis of anteroseptal region, grade 1 diastolic dysfunction; mod MR; LA mod-severely dialted; RV mod dialted; RA mildly dilated; PA peak pressure 60mmHg  . VIDEO BRONCHOSCOPY WITH ENDOBRONCHIAL NAVIGATION [name] 02/23/2017   Procedure: VIDEO BRONCHOSCOPY WITH ENDOBRONCHIAL NAVIGATION;  Surgeon: [name], MD;  Location: [place];  Service: Thoracic;  Laterality: [name];     Social History:   reports that he quit smoking about 18 years ago. His smoking use included cigarettes and pipe. He has a 60.00 pack-year smoking history. He has never used smokeless tobacco. He reports that he does not drink alcohol and does not use drugs.   Family History:  His family history includes Cancer in his mother and sister; Colon cancer in his sister; Diabetes in his daughter, mother, and sister; Heart attack in his father and maternal grandmother; Heart disease in his father, mother, and sister; Hyperlipidemia in his mother and sister; Hypertension in his father, mother, and sister; Lung cancer in his mother; Parkinson's disease in his sister.   Allergies Allergies  Allergen Reactions  . Tussionex Pennkinetic Er [Hydrocod Polst-Cpm Polst Er] Other (See Comments)    UNSPECIFIED REACTION  > "caused prostate problems"  . Ace Inhibitors Cough     Home Medications  Prior to Admission medications   Medication Sig Start Date End Date Taking? Authorizing  Provider  albuterol (PROVENTIL HFA;VENTOLIN HFA) 108 (90 BASE) MCG/ACT inhaler Inhale 2 puffs into the lungs every 6 (six) hours as needed for wheezing or shortness of breath. 12/25/14   [name], MD  albuterol (PROVENTIL) (2.5 MG/3ML) 0.083% nebulizer solution USE 1 VIAL IN NEBULIZER 4 TIMES DAILY 12/30/20   Baird Lyons D, MD  amiodarone (PACERONE) 200 MG tablet Take 1 tablet (200 mg total) by mouth daily. 12/17/20   Larey Dresser, MD  atorvastatin (LIPITOR) 80 MG tablet TAKE 1 TABLET  BY MOUTH EVERY DAY 01/31/21   Larey Dresser, MD  ATROVENT [facility] 17 MCG/ACT inhaler Inhale 2 puffs into the lungs every 4 (four) hours as needed for wheezing.  11/13/18   [provider]  carvedilol (COREG) 12.5 MG tablet Take 1 tablet (12.5 mg total) by mouth 2 (two) times daily with a meal. 04/21/21   Lyda Jester M, PA-C  dapagliflozin propanediol (FARXIGA) 10 MG TABS tablet Take 1 tablet (10 mg total) by mouth daily before breakfast. 06/29/20   Larey Dresser, MD  diazepam (VALIUM) 5 MG tablet Take 1 tablet (5 mg total) by mouth every 8 (eight) hours as needed for anxiety. 1-2 tabs every 8 hours if needed for tremor 03/23/20   [name], MD  ezetimibe (ZETIA) 10 MG tablet TAKE 1 TABLET BY MOUTH  DAILY 02/21/21   Larey Dresser, MD  finasteride (PROSCAR) 5 MG tablet TAKE 1 TABLET BY MOUTH  DAILY 04/13/21   Irine Seal, MD  fluticasone [facility]) 50 MCG/ACT nasal spray Instill 1 spray in each  nostril daily 06/01/16   Larey Dresser, MD  Fluticasone-Umeclidin-Vilant (TRELEGY ELLIPTA) 100-62.5-25 MCG/INH AEPB Inhale 1 puff into the lungs daily. Rinse mouth 05/31/20   Young, Tarri Fuller D, MD  icosapent Ethyl (VASCEPA) 1 g capsule TAKE 2 CAPSULES (2 G TOTAL) BY MOUTH 2 (TWO) TIMES A DAY. 02/21/21   Bensimhon, Shaune Pascal, MD  isosorbide mononitrate (IMDUR) 60 MG 24 hr tablet TAKE 1.5 TABLETS (90 MG TOTAL) BY MOUTH DAILY. 04/14/21   Bensimhon, Shaune Pascal, MD  levothyroxine (SYNTHROID) 50 MCG tablet TAKE 1  TABLET BY MOUTH  DAILY BEFORE BREAKFAST 05/02/20   [name], MD  Multiple Vitamins-Minerals (MULTIVITAMIN WITH MINERALS) tablet Take 1 tablet by mouth daily.    [provider]  Multiple Vitamins-Minerals (PRESERVISION AREDS 2 PO) Take 1 capsule by mouth at bedtime.    [provider]  nitroGLYCERIN (NITROSTAT) 0.4 MG SL tablet PLACE 1 TABLET UNDER THE TONGUE EVERY 5 MINUTES AS NEEDED FOR CHEST PAIN. 04/18/21   Larey Dresser, MD  NON FORMULARY at bedtime. CPAP And during the day as needed    [provider]  potassium chloride SA (KLOR-CON M20) 20 MEQ tablet Take 1.5 tablets (30 mEq total) by mouth daily. 05/05/21   Consuelo Pandy, PA-C  Rivaroxaban (XARELTO) 15 MG TABS tablet Take 1 tablet (15 mg total) by mouth daily with supper. 08/10/20   Larey Dresser, MD  sacubitril-valsartan (ENTRESTO) 97-103 MG Take 1 tablet by mouth 2 (two) times daily. 04/21/21   Lyda Jester M, PA-C  spironolactone (ALDACTONE) 25 MG tablet TAKE 1 TABLET BY MOUTH  DAILY 04/14/21   Larey Dresser, MD  torsemide (DEMADEX) 20 MG tablet Take 3 tablets (60 mg total) by mouth in the morning AND 2 tablets (40 mg total) every evening. 04/19/21   Lyda Jester M, PA-C  traMADol (ULTRAM) 50 MG tablet Take 1 tablet (50 mg total) by mouth every 6 (six) hours as needed. 07/14/20   [name], MD     Critical care time: 31 mins       Kennieth Rad, ACNP [facility] 05/09/2021, 3:37 AM

## 2021-05-09 NOTE — Procedures (Signed)
Arterial Catheter Insertion Procedure Note  COBIE MARCOUX  [id number]  06-26-1946  Date:05/09/21  Time:7:42 PM    Provider Performing: Candee Furbish    Procedure: Insertion of Arterial Line [phone]) with US guidance (19147)   Indication(s) Blood pressure monitoring and/or need for frequent ABGs  Consent Risks of the procedure as well as the alternatives and risks of each were explained to the patient and/or caregiver.  Consent for the procedure was obtained and is signed in the bedside chart  Anesthesia None   Time Out Verified patient identification, verified procedure, site/side was marked, verified correct patient position, special equipment/implants available, medications/allergies/relevant history reviewed, required imaging and test results available.   Sterile Technique Maximal sterile technique including full sterile barrier drape, hand hygiene, sterile gown, sterile gloves, mask, hair covering, sterile ultrasound probe cover (if used).   Procedure Description Area of catheter insertion was cleaned with chlorhexidine and draped in sterile fashion. With real-time ultrasound guidance an arterial catheter was placed into the left radial artery.  Appropriate arterial tracings confirmed on monitor.     Complications/Tolerance None; patient tolerated the procedure well.   EBL Minimal   Specimen(s) None

## 2021-05-09 NOTE — Procedures (Signed)
Central Venous Catheter Insertion Procedure Note  CHASTON BRADBURN  [id number]  [birth year]  Date:05/09/21  Time:1:03 PM   Provider Performing:Maevyn Riordan Cipriano Mile   Procedure: Insertion of Non-tunneled Central Venous [phone]) with US guidance (02725)   Indication(s) Medication administration  Consent Risks of the procedure as well as the alternatives and risks of each were explained to the patient and/or caregiver.  Consent for the procedure was obtained and is signed in the bedside chart  Anesthesia Topical only with 1% lidocaine   Timeout Verified patient identification, verified procedure, site/side was marked, verified correct patient position, special equipment/implants available, medications/allergies/relevant history reviewed, required imaging and test results available.  Sterile Technique Maximal sterile technique including full sterile barrier drape, hand hygiene, sterile gown, sterile gloves, mask, hair covering, sterile ultrasound probe cover (if used).  Procedure Description Area of catheter insertion was cleaned with chlorhexidine and draped in sterile fashion.  With real-time ultrasound guidance a central venous catheter was placed into the right internal jugular vein. Nonpulsatile blood flow and easy flushing noted in all ports.  The catheter was sutured in place and sterile dressing applied.  Complications/Tolerance None; patient tolerated the procedure well. Chest X-ray is ordered to verify placement for internal jugular or subclavian cannulation.   Chest x-ray is not ordered for femoral cannulation.  EBL Minimal  Specimen(s) None

## 2021-05-09 NOTE — Progress Notes (Signed)
[place] for heparin Indication: atrial fibrillation  Allergies  Allergen Reactions  . Tussionex Pennkinetic Er [Hydrocod Polst-Cpm Polst Er] Other (See Comments)    UNSPECIFIED REACTION  > "caused prostate problems"  . Ace Inhibitors Cough    Patient Measurements: Height: 5\' 7"  (170.2 cm) Weight: 130 kg (286 lb 9.6 oz) IBW/kg (Calculated) : 66.1 Heparin Dosing Weight: 96.8 kg  Vital Signs: Temp: 100.8 F (38.2 C) (05/30 1949) Temp Source: Axillary (05/30 1949) BP: 138/64 (05/30 2100) Pulse Rate: 78 (05/30 1925)  Labs: Recent Labs    05/07/2021 2321 05/09/21 0020 05/09/21 0116 05/09/21 0330 05/09/21 0409 05/09/21 0431 05/09/21 0935 05/09/21 2044 05/09/21 2112  HGB 16.5   < >  --    < > 17.2*  --   --  15.8 15.6  HCT 53.3*   < >  --    < > 54.3*  --   --  49.9 46.0  PLT 253  --   --   --  184  --   --  263  --   APTT  --   --   --   --   --   --  46* 70*  --   LABPROT  --   --  14.9  --   --   --   --   --   --   INR  --   --  1.2  --   --   --   --   --   --   HEPARINUNFRC  --   --   --   --   --   --  0.87*  --   --   CREATININE 2.14*  --   --   --  2.21*  --   --   --   --   TROPONINIHS 92*  --   --   --   --  276*  --   --   --    < > = values in this interval not displayed.    Estimated Creatinine Clearance: 36.9 mL/min (A) (by C-G formula based on SCr of 2.21 mg/dL (H)).   Medical History: Past Medical History:  Diagnosis Date  . AAA (abdominal aortic aneurysm) ([place])    followed by Dr. Bridgett Larsson  . Adenomatous colon polyp 01/2004  . Anemia    hx  . Automatic implantable cardioverter-defibrillator in situ   . AVM (arteriovenous malformation)   . BPH (benign prostatic hypertrophy)   . CAD (coronary artery disease)    s/p CABGx2 in 1996  . Carotid artery stenosis    LCEA - Dr. Bridgett Larsson in 2013  . CHF (congestive heart failure) ([place])   . Complication of anesthesia    claustrophobic, unabe to lie on back more than 4 hours  at time due to back  . COPD (chronic obstructive pulmonary disease) ([place])   . Diabetes mellitus    DIET CONTROLLED- pt states that this was a misdiagnosis, he was treated while in the hospital  but returned home, loss a massive amount of weight and he has not had a problem with his blood sugar since. States everything has been normal for about 3 years.  . Diverticulosis   . Dyspnea   . Dysrhythmia    ICD-defibrillator  . Fatigue   . GERD (gastroesophageal reflux disease)   . H/O hiatal hernia   . History of radiation therapy 04/09/17-04/17/17   SBRT right lung 54 Gy in  3 fractions  . HLD (hyperlipidemia)   . Hypertension   . Hypothyroidism   . Ischemic cardiomyopathy   . Myocardial infarction ([place])   . 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 OSA on CPAP    AHI durign total sleep 14.69/hr, during REM 50.91/hr  . Peripheral vascular disease ([facility])    LCEA, L renal artery stent, bilat iliac stents, R SFA stenosis  . Tremor   . Ventricular tachycardia ([place]) 09/09/2014   Amiodarone was started after appropriate defibrillator shocks for ventricular tachycardia in October 2008    Assessment: 75 yo man to start heparin while xarelto on hold with VDRF and AKI.  Last dose xarelto 5/28.    Aptt tonight after rate adjustment is at goal (70s). No bleeding issues noted.   Goal of Therapy:  APTT 66-102 sec Heparin level 0.3-0.7 units/ml Monitor platelets by anticoagulation protocol: Yes   Plan:  -Continue heparin at 1850 units/hr -Heparin daily wth CBC   Erin Hearing PharmD., BCPS Clinical Pharmacist 05/09/2021 10:08 PM

## 2021-05-09 NOTE — Progress Notes (Signed)
ANTICOAGULATION CONSULT NOTE - Initial Consult  Pharmacy Consult for heparin Indication: atrial fibrillation  Allergies  Allergen Reactions  . Tussionex Pennkinetic Er [Hydrocod Polst-Cpm Polst Er] Other (See Comments)    UNSPECIFIED REACTION  > "caused prostate problems"  . Ace Inhibitors Cough    Patient Measurements: Height: 5\' 7"  (170.2 cm) Weight: 130 kg (286 lb 9.6 oz) IBW/kg (Calculated) : 66.1 Heparin Dosing Weight: 96.8 kg  Vital Signs: Temp: 98.6 F (37 C) (05/30 0100) Temp Source: Oral (05/30 0100) BP: 139/78 (05/30 0328) Pulse Rate: 101 (05/30 0328)  Labs: Recent Labs    04/16/2021 2321 05/09/21 0020 05/09/21 0116  HGB 16.5 17.7*  --   HCT 53.3* 52.0  --   PLT 253  --   --   LABPROT  --   --  14.9  INR  --   --  1.2  CREATININE 2.14*  --   --   TROPONINIHS 92*  --   --     Estimated Creatinine Clearance: 38.1 mL/min (A) (by C-G formula based on SCr of 2.14 mg/dL (H)).   Medical History: Past Medical History:  Diagnosis Date  . AAA (abdominal aortic aneurysm) ([place])    followed by Dr. Bridgett Larsson  . Adenomatous colon polyp 01/2004  . Anemia    hx  . Automatic implantable cardioverter-defibrillator in situ   . AVM (arteriovenous malformation)   . BPH (benign prostatic hypertrophy)   . CAD (coronary artery disease)    s/p CABGx2 in 1996  . Carotid artery stenosis    LCEA - Dr. Bridgett Larsson in 2013  . CHF (congestive heart failure) ([place])   . Complication of anesthesia    claustrophobic, unabe to lie on back more than 4 hours at time due to back  . COPD (chronic obstructive pulmonary disease) ([place])   . Diabetes mellitus    DIET CONTROLLED- pt states that this was a misdiagnosis, he was treated while in the hospital  but returned home, loss a massive amount of weight and he has not had a problem with his blood sugar since. States everything has been normal for about 3 years.  . Diverticulosis   . Dyspnea   . Dysrhythmia    ICD-defibrillator  . Fatigue   . GERD  (gastroesophageal reflux disease)   . H/O hiatal hernia   . History of radiation therapy 04/09/17-04/17/17   SBRT right lung 54 Gy in 3 fractions  . HLD (hyperlipidemia)   . Hypertension   . Hypothyroidism   . Ischemic cardiomyopathy   . Myocardial infarction ([place])   . 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 OSA on CPAP    AHI durign total sleep 14.69/hr, during REM 50.91/hr  . Peripheral vascular disease (HCC)    LCEA, L renal artery stent, bilat iliac stents, R SFA stenosis  . Tremor   . Ventricular tachycardia ([place]) 09/09/2014   Amiodarone was started after appropriate defibrillator shocks for ventricular tachycardia in October 2008    Medications:  Medications Prior to Admission  Medication Sig Dispense Refill Last Dose  . albuterol (PROVENTIL HFA;VENTOLIN HFA) 108 (90 BASE) MCG/ACT inhaler Inhale 2 puffs into the lungs every 6 (six) hours as needed for wheezing or shortness of breath. 3 Inhaler 4 Past Month at Unknown time  . albuterol (PROVENTIL) (2.5 MG/3ML) 0.083% nebulizer solution USE 1 VIAL IN NEBULIZER 4 TIMES DAILY (Patient taking differently: Take 2.5 mg by nebulization 4 (four) times daily.) 120 mL 11 05/10/2021 at Unknown time  .  amiodarone (PACERONE) 200 MG tablet Take 1 tablet (200 mg total) by mouth daily. 45 tablet 3 05/07/2021  . atorvastatin (LIPITOR) 80 MG tablet TAKE 1 TABLET BY MOUTH EVERY DAY (Patient taking differently: Take 80 mg by mouth daily.) 30 tablet 1 05/07/2021  . ATROVENT HFA 17 MCG/ACT inhaler Inhale 2 puffs into the lungs every 4 (four) hours as needed for wheezing.   12 Past Month at Unknown time  . carvedilol (COREG) 12.5 MG tablet Take 1 tablet (12.5 mg total) by mouth 2 (two) times daily with a meal. 60 tablet 11 05/07/2021  . dapagliflozin propanediol (FARXIGA) 10 MG TABS tablet Take 1 tablet (10 mg total) by mouth daily before breakfast. 30 tablet 11 05/07/2021  . diazepam (VALIUM) 5 MG tablet Take 1 tablet (5 mg total) by mouth every 8 (eight) hours as  needed for anxiety. 1-2 tabs every 8 hours if needed for tremor 30 tablet 5 unk  . ezetimibe (ZETIA) 10 MG tablet TAKE 1 TABLET BY MOUTH  DAILY (Patient taking differently: Take 10 mg by mouth daily.) 90 tablet 3 05/07/2021  . finasteride (PROSCAR) 5 MG tablet TAKE 1 TABLET BY MOUTH  DAILY (Patient taking differently: Take 5 mg by mouth daily.) 90 tablet 3 05/07/2021  . fluticasone (FLONASE) 50 MCG/ACT nasal spray Instill 1 spray in each  nostril daily (Patient taking differently: Place 1 spray into both nostrils daily as needed for allergies.) 32 g 3 unk  . Fluticasone-Umeclidin-Vilant (TRELEGY ELLIPTA) 100-62.5-25 MCG/INH AEPB Inhale 1 puff into the lungs daily. Rinse mouth 60 each 12 04/27/2021 at Unknown time  . icosapent Ethyl (VASCEPA) 1 g capsule TAKE 2 CAPSULES (2 G TOTAL) BY MOUTH 2 (TWO) TIMES A DAY. (Patient taking differently: Take 2 g by mouth 2 (two) times daily.) 120 capsule 11 05/07/2021  . isosorbide mononitrate (IMDUR) 60 MG 24 hr tablet TAKE 1.5 TABLETS (90 MG TOTAL) BY MOUTH DAILY. (Patient taking differently: Take 90 mg by mouth daily.) 90 tablet 1 05/07/2021  . levothyroxine (SYNTHROID) 50 MCG tablet TAKE 1 TABLET BY MOUTH  DAILY BEFORE BREAKFAST (Patient taking differently: Take 50 mcg by mouth daily before breakfast.) 90 tablet 1 05/07/2021  . Multiple Vitamins-Minerals (MULTIVITAMIN WITH MINERALS) tablet Take 1 tablet by mouth daily.   05/07/2021  . Multiple Vitamins-Minerals (PRESERVISION AREDS 2 PO) Take 1 capsule by mouth at bedtime.   05/07/2021  . nitroGLYCERIN (NITROSTAT) 0.4 MG SL tablet PLACE 1 TABLET UNDER THE TONGUE EVERY 5 MINUTES AS NEEDED FOR CHEST PAIN. (Patient taking differently: Place 0.4 mg under the tongue every 5 (five) minutes as needed for chest pain.) 25 tablet 3 unk  . NON FORMULARY See admin instructions. CPAP At bedtime And during the day as needed when napping     . OXYGEN Inhale 2.5-3 L into the lungs continuous.     . potassium chloride SA (KLOR-CON M20) 20  MEQ tablet Take 1.5 tablets (30 mEq total) by mouth daily. 135 tablet 3   . Rivaroxaban (XARELTO) 15 MG TABS tablet Take 1 tablet (15 mg total) by mouth daily with supper. 30 tablet 11 05/07/2021  . sacubitril-valsartan (ENTRESTO) 49-51 MG Take 1 tablet by mouth 2 (two) times daily.   05/07/2021  . sacubitril-valsartan (ENTRESTO) 97-103 MG Take 1 tablet by mouth 2 (two) times daily. 180 tablet 3   . spironolactone (ALDACTONE) 25 MG tablet TAKE 1 TABLET BY MOUTH  DAILY (Patient taking differently: Take 25 mg by mouth daily.) 90 tablet 3 05/07/2021  . torsemide (  DEMADEX) 20 MG tablet Take 3 tablets (60 mg total) by mouth in the morning AND 2 tablets (40 mg total) every evening. 150 tablet 11 05/07/2021  . traMADol (ULTRAM) 50 MG tablet Take 1 tablet (50 mg total) by mouth every 6 (six) hours as needed. (Patient taking differently: Take 50 mg by mouth every 6 (six) hours as needed for moderate pain.) 75 tablet 3 Past Month at Unknown time    Assessment: 75 yo man to start heparin while xarelto on hold.  Last dose xarelto 5/28.  Hg 17.7, PTLC 253 Goal of Therapy:  APTT 66-102 sec Heparin level 0.3-0.7 units/ml Monitor platelets by anticoagulation protocol: Yes   Plan:  Start heparin drip at 1450 units/hr with no bolus Check heparin level and aPTT 6-8 hours after start. Daily aPTT, HL and CBC Monitor for bleeding complications  [name] 05/09/2021,4:24 AM

## 2021-05-09 NOTE — Progress Notes (Signed)
RT attempted to obtain sputum sample at this time. Unable to obtain a large enough sample to send to lab. Sputum trap is connected to inline suction.

## 2021-05-10 DIAGNOSIS — R092 Respiratory arrest: Secondary | ICD-10-CM | POA: Diagnosis not present

## 2021-05-10 LAB — TSH: TSH: 0.965 u[IU]/mL (ref 0.350–4.500)

## 2021-05-10 LAB — BASIC METABOLIC PANEL
Anion gap: 15 (ref 5–15)
BUN: 38 mg/dL — ABNORMAL HIGH (ref 8–23)
CO2: 20 mmol/L — ABNORMAL LOW (ref 22–32)
Calcium: 8.6 mg/dL — ABNORMAL LOW (ref 8.9–10.3)
Chloride: 104 mmol/L (ref 98–111)
Creatinine, Ser: 1.84 mg/dL — ABNORMAL HIGH (ref 0.61–1.24)
GFR, Estimated: 38 mL/min — ABNORMAL LOW (ref >60)
Glucose, Bld: 276 mg/dL — ABNORMAL HIGH (ref 70–99)
Potassium: 3.9 mmol/L (ref 3.5–5.1)
Sodium: 139 mmol/L (ref 135–145)

## 2021-05-10 LAB — GLUCOSE, CAPILLARY
Glucose-Capillary: 256 mg/dL — ABNORMAL HIGH (ref 70–99)
Glucose-Capillary: 244 mg/dL — ABNORMAL HIGH (ref 70–99)
Glucose-Capillary: 260 mg/dL — ABNORMAL HIGH (ref 70–99)
Glucose-Capillary: 251 mg/dL — ABNORMAL HIGH (ref 70–99)
Glucose-Capillary: 287 mg/dL — ABNORMAL HIGH (ref 70–99)
Glucose-Capillary: 243 mg/dL — ABNORMAL HIGH (ref 70–99)

## 2021-05-10 LAB — CBC
HCT: 47.3 % (ref 39.0–52.0)
Hemoglobin: 15.2 g/dL (ref 13.0–17.0)
MCH: 31.2 pg (ref 26.0–34.0)
MCHC: 32.1 g/dL (ref 30.0–36.0)
MCV: 97.1 fL (ref 80.0–100.0)
Platelets: 209 10*3/uL (ref 150–400)
RBC: 4.87 MIL/uL (ref 4.22–5.81)
RDW: 13.5 % (ref 11.5–15.5)
WBC: 17.6 10*3/uL — ABNORMAL HIGH (ref 4.0–10.5)
nRBC: 0 % (ref 0.0–0.2)

## 2021-05-10 LAB — COOXEMETRY PANEL
Carboxyhemoglobin: 0.7 % (ref 0.5–1.5)
Methemoglobin: 1.1 % (ref 0.0–1.5)
O2 Saturation: 73.7 %
Total hemoglobin: 15 g/dL (ref 12.0–16.0)

## 2021-05-10 LAB — HEMOGLOBIN A1C
Hgb A1c MFr Bld: 5.9 % — ABNORMAL HIGH (ref 4.8–5.6)
Mean Plasma Glucose: 123 mg/dL

## 2021-05-10 LAB — APTT: aPTT: 85 seconds — ABNORMAL HIGH (ref 24–36)

## 2021-05-10 LAB — HEPARIN LEVEL (UNFRACTIONATED): Heparin Unfractionated: 0.83 IU/mL — ABNORMAL HIGH (ref 0.30–0.70)

## 2021-05-10 MED ORDER — DIAZEPAM 2 MG PO TABS
2.0000 mg | ORAL_TABLET | Freq: Three times a day (TID) | ORAL | Status: DC
  Administered 2021-05-10 (×3): 2 mg
  Filled 2021-05-10 (×3): qty 1

## 2021-05-10 MED ORDER — BETHANECHOL CHLORIDE 10 MG PO TABS
10.0000 mg | ORAL_TABLET | Freq: Three times a day (TID) | ORAL | Status: DC
  Administered 2021-05-10 – 2021-05-26 (×49): 10 mg
  Filled 2021-05-10 (×50): qty 1

## 2021-05-10 MED ORDER — SODIUM CHLORIDE 0.9% FLUSH
10.0000 mL | Freq: Two times a day (BID) | INTRAVENOUS | Status: DC
  Administered 2021-05-10 – 2021-05-18 (×15): 10 mL
  Administered 2021-05-19: 20 mL
  Administered 2021-05-19 – 2021-05-22 (×5): 10 mL

## 2021-05-10 MED ORDER — INSULIN GLARGINE 100 UNIT/ML ~~LOC~~ SOLN
15.0000 [IU] | Freq: Two times a day (BID) | SUBCUTANEOUS | Status: DC
  Administered 2021-05-10 – 2021-05-11 (×3): 15 [IU] via SUBCUTANEOUS
  Filled 2021-05-10 (×4): qty 0.15

## 2021-05-10 MED ORDER — FENTANYL CITRATE (PF) 100 MCG/2ML IJ SOLN
100.0000 ug | Freq: Once | INTRAMUSCULAR | Status: AC
  Administered 2021-05-10: 100 ug via INTRAVENOUS

## 2021-05-10 MED ORDER — FINASTERIDE 5 MG PO TABS: 5.0000 mg | ORAL_TABLET | Freq: Every day | ORAL | Status: DC

## 2021-05-10 MED ORDER — PROSOURCE TF PO LIQD
45.0000 mL | Freq: Two times a day (BID) | ORAL | Status: DC
  Administered 2021-05-10: 45 mL
  Filled 2021-05-10: qty 45

## 2021-05-10 MED ORDER — FENTANYL CITRATE (PF) 100 MCG/2ML IJ SOLN
INTRAMUSCULAR | Status: AC
  Filled 2021-05-10: qty 2

## 2021-05-10 MED ORDER — VITAL 1.5 CAL PO LIQD
1000.0000 mL | ORAL | Status: DC
  Administered 2021-05-10: 1000 mL

## 2021-05-10 MED ORDER — ATORVASTATIN CALCIUM 80 MG PO TABS
80.0000 mg | ORAL_TABLET | Freq: Every day | ORAL | Status: DC
  Administered 2021-05-10 – 2021-05-31 (×22): 80 mg
  Filled 2021-05-10 (×22): qty 1

## 2021-05-10 MED ORDER — SODIUM CHLORIDE 0.9% FLUSH
10.0000 mL | INTRAVENOUS | Status: DC | PRN
  Administered 2021-05-18: 10 mL

## 2021-05-10 MED ORDER — PROPOFOL 1000 MG/100ML IV EMUL
5.0000 ug/kg/min | INTRAVENOUS | Status: DC
  Administered 2021-05-10: 12.5 ug/kg/min via INTRAVENOUS
  Administered 2021-05-10: 5 ug/kg/min via INTRAVENOUS
  Administered 2021-05-11: 20 ug/kg/min via INTRAVENOUS
  Administered 2021-05-11: 25 ug/kg/min via INTRAVENOUS
  Administered 2021-05-11: 20 ug/kg/min via INTRAVENOUS
  Administered 2021-05-11: 25 ug/kg/min via INTRAVENOUS
  Administered 2021-05-12: 20 ug/kg/min via INTRAVENOUS
  Filled 2021-05-10 (×7): qty 100

## 2021-05-10 MED ORDER — VITAL HIGH PROTEIN PO LIQD: 1000.0000 mL | ORAL | Status: DC

## 2021-05-10 MED ORDER — ACETAMINOPHEN 160 MG/5ML PO SOLN
650.0000 mg | Freq: Four times a day (QID) | ORAL | Status: DC | PRN
  Administered 2021-05-10 – 2021-05-20 (×12): 650 mg
  Filled 2021-05-10 (×11): qty 20.3

## 2021-05-10 MED ORDER — PROSOURCE TF PO LIQD
90.0000 mL | Freq: Three times a day (TID) | ORAL | Status: DC
  Administered 2021-05-10 – 2021-06-01 (×65): 90 mL
  Filled 2021-05-10 (×67): qty 90

## 2021-05-10 MED ORDER — INSULIN GLARGINE 100 UNIT/ML ~~LOC~~ SOLN: 20.0000 [IU] | Freq: Two times a day (BID) | SUBCUTANEOUS | Status: DC

## 2021-05-10 MED ORDER — METHYLPREDNISOLONE SODIUM SUCC 40 MG IJ SOLR
40.0000 mg | Freq: Every day | INTRAMUSCULAR | Status: DC
  Administered 2021-05-10 – 2021-05-12 (×3): 40 mg via INTRAVENOUS
  Filled 2021-05-10 (×3): qty 1

## 2021-05-10 MED ORDER — VITAL 1.5 CAL PO LIQD
1000.0000 mL | ORAL | Status: DC
  Administered 2021-05-10 – 2021-05-14 (×5): 1000 mL

## 2021-05-10 NOTE — Progress Notes (Signed)
Inpatient Diabetes Program Recommendations  AACE/ADA: New Consensus Statement on Inpatient Glycemic Control (2015)  Target Ranges:  Prepandial:   less than 140 mg/dL      Peak postprandial:   less than 180 mg/dL (1-2 hours)      Critically ill patients:  140 - 180 mg/dL   Lab Results  Component Value Date   GLUCAP 251 (H) 05/10/2021   HGBA1C 5.9 (H) 05/09/2021    Review of Glycemic Control Results for [name], [name] (MRN [id number]) as of 05/10/2021 14:12  Ref. Range 05/09/2021 19:46 05/09/2021 23:52 05/10/2021 04:52 05/10/2021 07:47 05/10/2021 11:31  Glucose-Capillary Latest Ref Range: 70 - 99 mg/dL 263 (H) 256 (H) 244 (H) 260 (H) 251 (H)   Diabetes history: DM 2 Outpatient Diabetes medications:  Farxiga 10 mg daily Current orders for Inpatient glycemic control:  Novolog moderate q 4 hours Vital 45 cc/ hr Lantus 15  Units bid (increased today) Solumedrol 40 mg IV daily Inpatient Diabetes Program Recommendations:    Note feeds started today.  Lantus increased.  Consider adding Novolog tube feed coverage 3 units q 4 hours.   Thanks,  Adah Perl, RN, BC-ADM Inpatient Diabetes Coordinator Pager [phone] (8a-5p)

## 2021-05-10 NOTE — Plan of Care (Signed)
  Problem: Education: Goal: Knowledge of General Education information will improve Description: Including pain rating scale, medication(s)/side effects and non-pharmacologic comfort measures Outcome: Progressing   Problem: Health Behavior/Discharge Planning: Goal: Ability to manage health-related needs will improve Outcome: Completed/Met   Problem: Clinical Measurements: Goal: Ability to maintain clinical measurements within normal limits will improve Outcome: Progressing Goal: Will remain free from infection Outcome: Not Progressing Goal: Diagnostic test results will improve Outcome: Progressing Goal: Respiratory complications will improve Outcome: Progressing

## 2021-05-10 NOTE — Progress Notes (Signed)
NAME:  [name], MRN:  [id number], DOB:  June 30, 1946, LOS: 1 ADMISSION DATE:  04/10/2021, CONSULTATION DATE:  05/09/2021 REFERRING MD:  Dr. Randal Buba, CHIEF COMPLAINT:  SOB  History of Present Illness:   HPI obtained from medical chart review as patient is currently intubated and sedated on mechanical ventilation.  No family at bedside.  68 yoM with past medical hx as below presenting from home with complaints of SOB/ respiratory distress.  Treated by EMS with duonebs, epi, solumedrol, and mag without improvement, requiring BVM.  On arrival to ER, patient required intubation as he was peri- arrest, unresponsive, bradycardic, temp 95.9, hypotensive, and apneic with diffuse apical wheeze requiring emergent intubation on arrival.  Noted to have pink frothy secretions on intubation. S/p lasix 40 mg.  Post intubation hypotensive resolved.  Placed on fentanyl gtt for sedation.   Labs significant for WBC 14.8, glucose 308, BUN 37, sCr 2.14 (previously 1.38 on 5/20), low albumin and protein, Mag 3.2, BNP 281, trop hs 92.  EKG noted for paced atrial rhythm.  CXR with bilateral ground glass suggesting edema vs infection.  Post intubation ABG 7.172/ 65/ 77/ 24.3.  Rate increased to 24, limited due to auto-peeping.  PCCM called for admit.   Pertinent  Medical History  Morbid obesity, HFrEF (12/24/20 EF 40-45%, G2DD, PAF, mildly reduced systolic RV, mild MR), VT on amio/ ICD, CAD s/p CABG 1996, LBBB, HLD, COPD, OSA on CPAP, AAA (s/p repair 2015), NSCL lung CA s/p XRT 2018, anemia, BPH, DMT2, GERD, hiatal hernia, PVD, tremor  Significant Hospital Events: Including procedures, antibiotic start and stop dates in addition to other pertinent events   . 5/30 admitted for respiratory failure/ intubated, empiric CAP coverage/ duresis  Interim History / Subjective:  Awake on vent. Denies pain. Failed SBT due to WOB. Low grade fevers overnight.  Objective   Blood pressure (!) 169/84, pulse (!) 109, temperature  (!) 100.5 F (38.1 C), temperature source Axillary, resp. rate (!) 24, height 5\' 7"  (1.702 m), weight 131.1 kg, SpO2 98 %. CVP:  [14 mmHg] 14 mmHg  Vent Mode: PRVC FiO2 (%):  [40 %-80 %] 40 % Set Rate:  [24 bmp] 24 bmp Vt Set:  [530 mL] 530 mL PEEP:  [8 cmH20] 8 cmH20 Plateau Pressure:  [17 cmH20-26 cmH20] 26 cmH20   Intake/Output Summary (Last 24 hours) at 05/10/2021 0900 Last data filed at 05/10/2021 0600 Gross per 24 hour  Intake 5515.25 ml  Output 2530 ml  Net 2985.25 ml   Filed Weights   05/09/21 0100 05/09/21 0400 05/10/21 0454  Weight: 130 kg 130 kg 131.1 kg    Examination: Constitutional: chronically ill man in NAD  Eyes: EOMI, pupils equal, tracking Ears, nose, mouth, and throat: ETT with small secretions Cardiovascular: RRR, paced rhythm on monitor Respiratory: Diminished bases with crackles, triggers vent Gastrointestinal: soft, +BS Skin: No rashes, normal turgor Neurologic: moves all 4 ext to command Psychiatric: anxious, RASS 0   Labs/imaging that I havepersonally reviewed  (right click and "Reselect all SmartList Selections" daily)  coox 81% ABG good CBG high BUN/Cr improved  Resolved Hospital Problem list    Assessment & Plan:   Overall most c/w septic shock 2/2 PNA in patient with poor cardiopulmonary baseline resulting in multiorgan dysfunction. - Pressors titrated to MAP 65 - Monitor fever curve  Acute hypoxic/ hypercarbic respiratory failure with sL>R multifocal infiltrates c/w CAP Hx of lung cancer with 3 recurrences with baseline abnormal CXR COPD questionable flare Failed SBT 5/31  due to WOB - Continue vent support, VAP prevention bundle - Solumedrol 40/day x 5 days - Ceftriaxone/azithromycin - PAD protocol with fent/precedex titrated to RASS 0 and CPOT < 3  HFrEF (12/24/20 EF 40-45%, G2DD, mildly reduced systolic RV, mild MR); SvO2 good Hx of VT on amio w/ICD Hx CAD s/p CABG 1996  Hx LBBB HTN PAF - heparin gtt - hold BB, entresto,  spirinolactone, torsemide, imdur,  - continue amiodarone, atorvastatin  AKI on CKD 3b BPH Phimosis- required bedside dilation by urology for foley placement 5/30 - resume home proscar - Improving with hydration, continue for now then likely will need to diurese - Avoid nephrotoxic agents, ensure adequate renal perfusion - Appreciate urology help placing foley, can consider OP referral  Hypothyroidism - continue levothyroxine 50 mcg daily per tube  Hyperglycemia, possible exacerbated by steroids/ stress response - lantus/SSI, goal CBG 100-180  Class 3 obesity  Baseline anxiety d/o- start standing valium low dose to prevent w/d  Best practice (right click and "Reselect all SmartList Selections" daily)  Diet:  TF Pain/Anxiety/Delirium protocol (if indicated): Yes (RASS goal -1) VAP protocol (if indicated): Yes DVT prophylaxis: Systemic AC GI prophylaxis: PPI Glucose control:  SSI/lantus Central venous access:  yes Arterial line:  yes Foley:  yes Mobility:  bed rest  PT consulted: [name] Last date of multidisciplinary goals of care discussion [pending] Code Status:  full code Disposition: admit ICU  Patient critically ill due to respiratory failure, shock Interventions to address this today vent and pressor titration Risk of deterioration without these interventions is high  I personally spent 38 minutes providing critical care not including any separately billable procedures  Erskine Emery MD [facility]  Prefer epic messenger for cross cover needs If after hours, please call E-link

## 2021-05-10 NOTE — Progress Notes (Signed)
eLink Physician-Brief Progress Note Patient Name: [name] DOB: August 15, 1946 MRN: [id number]   Date of Service  05/10/2021  HPI/Events of Note  Fever to 101.1 F - Already on Azithromycin and Rocephin for CAP. AST and ALT are both normal.   eICU Interventions  Plan: 1. Tylenol Solution 650 mg per tube Q 6 hours PRN Temp > 101.0 F.     Intervention Category Major Interventions: Other:  Lysle Dingwall 05/10/2021, 12:30 AM

## 2021-05-10 NOTE — Progress Notes (Signed)
Initial Nutrition Assessment  DOCUMENTATION CODES:   Obesity unspecified  INTERVENTION:   Tube Feeding via OG:  Vital 1.5 at 45 ml/hr Pro-Stat 90 mL TID Provides 1860 kcals, 139 g of protein and 821 mL of free water   NUTRITION DIAGNOSIS:   Inadequate oral intake related to acute illness as evidenced by NPO status.  GOAL:   Patient will meet greater than or equal to 90% of their needs  MONITOR:   Vent status,TF tolerance,Labs,Weight trends  REASON FOR ASSESSMENT:   Consult,Ventilator Enteral/tube feeding initiation and management  ASSESSMENT:   75 yo male admitted with acute respiratory failure, CHF, hx of lung cancer with 3 recurrences, AKI on CKD. PMH includes CHF, DM, CAD/CABG, HLD, COPD, GERD, PVD, GERD, hiatal hernia   Pt currently sedated on vent support, failed vent wean this AM Requiring levpohed and vasopressin  Wife at beside and reports pt with very good appetite and weight gain PTA. Wife believes that weight gain was a combination of fluid weight and true weight given pt was eating more than he usually does.   Current wt 131.1 kg. Noted per weight encounters, weight has trended up over the past year. Unsure of actual dry weight at this time. Noted weight of 129.7 kg earlier this month, 127 kg in April  OG tube with tip in stomach per chest xray  Labs: CBGs 244-260 Meds: ss novolog, lantus, solumedrol  NUTRITION - FOCUSED PHYSICAL EXAM:  Flowsheet Row Most Recent Value  Orbital Region Mild depletion  Upper Arm Region No depletion  Thoracic and Lumbar Region No depletion  Buccal Region Unable to assess  Temple Region Mild depletion  Clavicle Bone Region No depletion  Clavicle and Acromion Bone Region No depletion  Scapular Bone Region No depletion  Dorsal Hand Unable to assess  Patellar Region Unable to assess  Anterior Thigh Region Unable to assess  Posterior Calf Region Unable to assess  Edema (RD Assessment) Moderate       Diet Order:    Diet Order            Diet NPO time specified  Diet effective now                 EDUCATION NEEDS:   Not appropriate for education at this time  Skin:  Skin Assessment: Reviewed RN Assessment  Last BM:  no documented BM  Height:   Ht Readings from Last 1 Encounters:  05/09/21 5\' 7"  (1.702 m)    Weight:   Wt Readings from Last 1 Encounters:  05/10/21 131.1 kg    BMI:  Body mass index is 45.27 kg/m.  Estimated Nutritional Needs:   Kcal:  1860 kcals  Protein:  135-150 g  Fluid:  >/= 1.8 L   Kerman Passey MS, RDN, LDN, CNSC Registered Dietitian III Clinical Nutrition RD Pager and On-Call Pager Number Located in [place]

## 2021-05-10 NOTE — Progress Notes (Signed)
[facility] for heparin Indication: atrial fibrillation  Allergies  Allergen Reactions  . Tussionex Pennkinetic Er [Hydrocod Polst-Cpm Polst Er] Other (See Comments)    UNSPECIFIED REACTION  > "caused prostate problems"  . Ace Inhibitors Cough    Patient Measurements: Height: 5\' 7"  (170.2 cm) Weight: 131.1 kg (289 lb 0.4 oz) IBW/kg (Calculated) : 66.1 Heparin Dosing Weight: 96.8 kg  Vital Signs: Temp: 100.5 F (38.1 C) (05/31 0733) Temp Source: Axillary (05/31 0733) BP: 121/51 (05/31 0700) Pulse Rate: 85 (05/31 0726)  Labs: Recent Labs    04/20/2021 2321 05/09/21 0020 05/09/21 0116 05/09/21 0330 05/09/21 0409 05/09/21 0431 05/09/21 0935 05/09/21 2044 05/09/21 2112 05/10/21 0445  HGB 16.5   < >  --    < > 17.2*  --   --  15.8 15.6 15.2  HCT 53.3*   < >  --    < > 54.3*  --   --  49.9 46.0 47.3  PLT 253  --   --   --  184  --   --  263  --  209  APTT  --   --   --   --   --   --  46* 70*  --  85*  LABPROT  --   --  14.9  --   --   --   --   --   --   --   INR  --   --  1.2  --   --   --   --   --   --   --   HEPARINUNFRC  --   --   --   --   --   --  0.87*  --   --  0.83*  CREATININE 2.14*  --   --   --  2.21*  --   --   --   --  1.84*  TROPONINIHS 92*  --   --   --   --  276*  --   --   --   --    < > = values in this interval not displayed.    Estimated Creatinine Clearance: 44.5 mL/min (A) (by C-G formula based on SCr of 1.84 mg/dL (H)).   Medical History: Past Medical History:  Diagnosis Date  . AAA (abdominal aortic aneurysm) ([place])    followed by Dr. Bridgett Larsson  . Adenomatous colon polyp 01/2004  . Anemia    hx  . Automatic implantable cardioverter-defibrillator in situ   . AVM (arteriovenous malformation)   . BPH (benign prostatic hypertrophy)   . CAD (coronary artery disease)    s/p CABGx2 in 1996  . Carotid artery stenosis    LCEA - Dr. Bridgett Larsson in 2013  . CHF (congestive heart failure) ([place])   . Complication of  anesthesia    claustrophobic, unabe to lie on back more than 4 hours at time due to back  . COPD (chronic obstructive pulmonary disease) ([place])   . Diabetes mellitus    DIET CONTROLLED- pt states that this was a misdiagnosis, he was treated while in the hospital  but returned home, loss a massive amount of weight and he has not had a problem with his blood sugar since. States everything has been normal for about 3 years.  . Diverticulosis   . Dyspnea   . Dysrhythmia    ICD-defibrillator  . Fatigue   . GERD (gastroesophageal reflux disease)   . H/O hiatal  hernia   . History of radiation therapy 04/09/17-04/17/17   SBRT right lung 54 Gy in 3 fractions  . HLD (hyperlipidemia)   . Hypertension   . Hypothyroidism   . Ischemic cardiomyopathy   . Myocardial infarction ([place])   . 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 OSA on CPAP    AHI durign total sleep 14.69/hr, during REM 50.91/hr  . Peripheral vascular disease ([facility])    LCEA, L renal artery stent, bilat iliac stents, R SFA stenosis  . Tremor   . Ventricular tachycardia ([place]) 09/09/2014   Amiodarone was started after appropriate defibrillator shocks for ventricular tachycardia in October 2008    Assessment: 75 yo man to start heparin for afib while xarelto on hold with VDRF and AKI.  Last dose xarelto 5/28.    Aptt is at goal (85). HL falsely elevated (DOAC) 0.83. No bleeding issues noted. PLT 209, Hgb 15.2   Goal of Therapy:  APTT 66-102 sec Heparin level 0.3-0.7 units/ml Monitor platelets by anticoagulation protocol: Yes   Plan:  -Continue heparin at 1850 units/hr -Heparin level daily wth CBC -APTT daily  Michela Pitcher) Jeorgia Helming, PharmD Student 05/10/2021 8:16 AM

## 2021-05-11 ENCOUNTER — Inpatient Hospital Stay (HOSPITAL_COMMUNITY): Payer: Medicare Other

## 2021-05-11 ENCOUNTER — Other Ambulatory Visit (HOSPITAL_COMMUNITY): Payer: Medicare Other

## 2021-05-11 ENCOUNTER — Telehealth (HOSPITAL_COMMUNITY): Payer: Self-pay | Admitting: Pharmacy Technician

## 2021-05-11 DIAGNOSIS — J9601 Acute respiratory failure with hypoxia: Secondary | ICD-10-CM

## 2021-05-11 DIAGNOSIS — R609 Edema, unspecified: Secondary | ICD-10-CM

## 2021-05-11 DIAGNOSIS — N179 Acute kidney failure, unspecified: Secondary | ICD-10-CM | POA: Diagnosis not present

## 2021-05-11 LAB — BASIC METABOLIC PANEL
Anion gap: 13 (ref 5–15)
BUN: 51 mg/dL — ABNORMAL HIGH (ref 8–23)
CO2: 18 mmol/L — ABNORMAL LOW (ref 22–32)
Calcium: 8.3 mg/dL — ABNORMAL LOW (ref 8.9–10.3)
Chloride: 109 mmol/L (ref 98–111)
Creatinine, Ser: 1.92 mg/dL — ABNORMAL HIGH (ref 0.61–1.24)
GFR, Estimated: 36 mL/min — ABNORMAL LOW (ref >60)
Glucose, Bld: 350 mg/dL — ABNORMAL HIGH (ref 70–99)
Potassium: 4.3 mmol/L (ref 3.5–5.1)
Sodium: 140 mmol/L (ref 135–145)

## 2021-05-11 LAB — MAGNESIUM: Magnesium: 2.6 mg/dL — ABNORMAL HIGH (ref 1.7–2.4)

## 2021-05-11 LAB — GLUCOSE, CAPILLARY
Glucose-Capillary: 227 mg/dL — ABNORMAL HIGH (ref 70–99)
Glucose-Capillary: 229 mg/dL — ABNORMAL HIGH (ref 70–99)
Glucose-Capillary: 244 mg/dL — ABNORMAL HIGH (ref 70–99)
Glucose-Capillary: 251 mg/dL — ABNORMAL HIGH (ref 70–99)
Glucose-Capillary: 266 mg/dL — ABNORMAL HIGH (ref 70–99)
Glucose-Capillary: 305 mg/dL — ABNORMAL HIGH (ref 70–99)
Glucose-Capillary: 337 mg/dL — ABNORMAL HIGH (ref 70–99)

## 2021-05-11 LAB — CBC
HCT: 44.6 % (ref 39.0–52.0)
Hemoglobin: 14.2 g/dL (ref 13.0–17.0)
MCH: 31.6 pg (ref 26.0–34.0)
MCHC: 31.8 g/dL (ref 30.0–36.0)
MCV: 99.1 fL (ref 80.0–100.0)
Platelets: 167 10*3/uL (ref 150–400)
RBC: 4.5 MIL/uL (ref 4.22–5.81)
RDW: 13.5 % (ref 11.5–15.5)
WBC: 17.1 10*3/uL — ABNORMAL HIGH (ref 4.0–10.5)
nRBC: 0 % (ref 0.0–0.2)

## 2021-05-11 LAB — URINE CULTURE: Culture: NO GROWTH

## 2021-05-11 LAB — HEPATIC FUNCTION PANEL
ALT: 19 U/L (ref 0–44)
AST: 16 U/L (ref 15–41)
Albumin: 2.8 g/dL — ABNORMAL LOW (ref 3.5–5.0)
Alkaline Phosphatase: 53 U/L (ref 38–126)
Bilirubin, Direct: 0.1 mg/dL (ref 0.0–0.2)
Indirect Bilirubin: 0.6 mg/dL (ref 0.3–0.9)
Total Bilirubin: 0.7 mg/dL (ref 0.3–1.2)
Total Protein: 5.4 g/dL — ABNORMAL LOW (ref 6.5–8.1)

## 2021-05-11 LAB — CULTURE, RESPIRATORY W GRAM STAIN: Culture: NORMAL

## 2021-05-11 LAB — APTT
aPTT: 139 seconds — ABNORMAL HIGH (ref 24–36)
aPTT: 124 seconds — ABNORMAL HIGH (ref 24–36)

## 2021-05-11 LAB — TRIGLYCERIDES: Triglycerides: 200 mg/dL — ABNORMAL HIGH (ref <150)

## 2021-05-11 LAB — HEPARIN LEVEL (UNFRACTIONATED): Heparin Unfractionated: 1.04 IU/mL — ABNORMAL HIGH (ref 0.30–0.70)

## 2021-05-11 LAB — PHOSPHORUS: Phosphorus: 2.6 mg/dL (ref 2.5–4.6)

## 2021-05-11 MED ORDER — CLONAZEPAM 1 MG PO TABS
1.0000 mg | ORAL_TABLET | Freq: Two times a day (BID) | ORAL | Status: DC
  Administered 2021-05-11 – 2021-05-26 (×31): 1 mg
  Filled 2021-05-11 (×31): qty 1

## 2021-05-11 MED ORDER — INSULIN GLARGINE 100 UNIT/ML ~~LOC~~ SOLN
15.0000 [IU] | Freq: Once | SUBCUTANEOUS | Status: AC
  Administered 2021-05-11: 15 [IU] via SUBCUTANEOUS
  Filled 2021-05-11: qty 0.15

## 2021-05-11 MED ORDER — FUROSEMIDE 10 MG/ML IJ SOLN: 40.0000 mg | Freq: Four times a day (QID) | INTRAMUSCULAR | Status: DC

## 2021-05-11 MED ORDER — INSULIN GLARGINE 100 UNIT/ML ~~LOC~~ SOLN
30.0000 [IU] | Freq: Two times a day (BID) | SUBCUTANEOUS | Status: DC
  Administered 2021-05-11 – 2021-05-15 (×8): 30 [IU] via SUBCUTANEOUS
  Filled 2021-05-11 (×9): qty 0.3

## 2021-05-11 MED ORDER — FENTANYL CITRATE (PF) 100 MCG/2ML IJ SOLN
INTRAMUSCULAR | Status: AC
  Filled 2021-05-11: qty 2

## 2021-05-11 MED ORDER — FUROSEMIDE 10 MG/ML IJ SOLN
60.0000 mg | Freq: Four times a day (QID) | INTRAMUSCULAR | Status: AC
Start: 2021-05-11 — End: 2021-05-11
  Administered 2021-05-11 (×2): 60 mg via INTRAVENOUS
  Filled 2021-05-11 (×2): qty 6

## 2021-05-11 MED ORDER — HEPARIN (PORCINE) 25000 UT/250ML-% IV SOLN
1750.0000 [IU]/h | INTRAVENOUS | Status: DC
  Administered 2021-05-12: 1500 [IU]/h via INTRAVENOUS
  Administered 2021-05-12 – 2021-05-13 (×2): 1750 [IU]/h via INTRAVENOUS
  Filled 2021-05-11 (×3): qty 250

## 2021-05-11 MED ORDER — FENTANYL CITRATE (PF) 100 MCG/2ML IJ SOLN
100.0000 ug | Freq: Once | INTRAMUSCULAR | Status: AC
  Administered 2021-05-11: 100 ug via INTRAVENOUS

## 2021-05-11 NOTE — Progress Notes (Signed)
Updated Mr. Tysean L Costabile's wife, Burdett Pinzon at bedside regarding condition of her husband.  [employer]., MSN, APRN, AGACNP-BC [facility]  05/11/2021 , 4:25 PM  Please see [email or web address] for pager details  If no response, please call [phone] After hours, please call Elink at [phone]

## 2021-05-11 NOTE — Plan of Care (Signed)
  Problem: Safety: Goal: Non-violent Restraint(s) Outcome: Completed/Met   Problem: Education: Goal: Knowledge of General Education information will improve Description: Including pain rating scale, medication(s)/side effects and non-pharmacologic comfort measures Outcome: Not Progressing   Problem: Clinical Measurements: Goal: Will remain free from infection Outcome: Not Progressing Goal: Respiratory complications will improve Outcome: Progressing Goal: Cardiovascular complication will be avoided Outcome: Progressing

## 2021-05-11 NOTE — Telephone Encounter (Signed)
Advanced Heart Failure Patient Advocate Encounter  I received a denial for [employer] assistance. Upon calling, the representative stated that they needed POI in order to be able to give a determination.   Called and spoke with the patient's wife. He is currently admitted. Shared the information with her and reminded her that this is not urgent. If he is discharged before we get POI and is out of Entresto we will provide samples. Patient's wife was appreciative. Stated that she would bring in POI when she could.

## 2021-05-11 NOTE — Progress Notes (Addendum)
NAME:  [name], MRN:  [id number], DOB:  11-20-1946, LOS: 2 ADMISSION DATE:  04/10/2021, CONSULTATION DATE:  05/09/2021 REFERRING MD:  Dr. Randal Buba, CHIEF COMPLAINT:  SOB  History of Present Illness:   HPI obtained from medical chart review as patient is currently intubated and sedated on mechanical ventilation.  No family at bedside.  61 yoM with past medical hx as below presenting from home with complaints of SOB/ respiratory distress.  Treated by EMS with duonebs, epi, solumedrol, and mag without improvement, requiring BVM.  On arrival to ER, patient required intubation as he was peri- arrest, unresponsive, bradycardic, temp 95.9, hypotensive, and apneic with diffuse apical wheeze requiring emergent intubation on arrival.  Noted to have pink frothy secretions on intubation.   PCCM called for admit.   He remains critically ill in the [facility] ICU  Pertinent  Medical History  Morbid obesity, HFrEF (12/24/20 EF 40-45%, G2DD, PAF, mildly reduced systolic RV, mild MR), VT on amio/ ICD, CAD s/p CABG 1996, LBBB, HLD, COPD, OSA on CPAP, AAA (s/p repair 2015), NSCL lung CA s/p XRT 2018, anemia, BPH, DMT2, GERD, hiatal hernia, PVD, tremor  Significant Hospital Events: Including procedures, antibiotic start and stop dates in addition to other pertinent events   . 5/30 admitted for respiratory failure/ intubated, empiric CAP coverage/ duresis . 6/1 BLLE Duplex   Interim History / Subjective:  No acute events overnight   Tmax 102.5 HR 72-130 BP 98/45-169/84  On exam on 21mcg levo, propofol 73mcg, heparin gtt, vasopressin 0.04, and precedex 0.4mg   +1.4 L past 24, +4.8L admit  Intubated/sedated  Unable to obtain subjective evaluation due to patient status   Objective   Blood pressure (!) 149/53, pulse 79, temperature (!) 101 F (38.3 C), temperature source Oral, resp. rate (!) 28, height 5\' 7"  (1.702 m), weight 132.5 kg, SpO2 99 %. CVP:  [0 mmHg-14 mmHg] 14 mmHg  Vent Mode: PRVC FiO2  (%):  [40 %-45 %] 40 % Set Rate:  [24 bmp] 24 bmp Vt Set:  [530 mL] 530 mL PEEP:  [8 cmH20] 8 cmH20 Plateau Pressure:  [19 cmH20-26 cmH20] 20 cmH20   Intake/Output Summary (Last 24 hours) at 05/11/2021 0920 Last data filed at 05/11/2021 0900 Gross per 24 hour  Intake 2891.14 ml  Output 1495 ml  Net 1396.14 ml   Filed Weights   05/09/21 0400 05/10/21 0454 05/11/21 0527  Weight: 130 kg 131.1 kg 132.5 kg    Examination: General:  Ill appearing, in bed, no acute distress HEENT: MM pink/moist, icteric/anicteric, trachea midline, ETT, OGT  Neuro: MAE, RASS -1, PERRL28mm CV: S1S2, paced, no m/r/g appreciated PULM:  clear in the upper lobes, diminished in the lower lobes, scant secretions, chest expansion symmetric GI: soft, bsx4 active, obese Extremities: warm/dry, generalized edema, capillary less than 3 seconds  Skin: no rashes or lesions noted  Labs/imaging that I havepersonally reviewed  (right click and "Reselect all SmartList Selections" daily)  CBC- WBC decreasing BMP- Creatine downtrending TG 200 MG, PHOS WNL  Resolved Hospital Problem list    Assessment & Plan:   Shock- suspected septic with pneumonia as source, complicated by poor cardiac function Acute hypoxic/ hypercarbic respiratory failure with sL>R multifocal infiltrates c/w CAP Hx of lung cancer with 3 recurrences with baseline abnormal CXR COPD questionable flare Failed SBT 5/31 and 6/1 due to WOB and agitation -LTVV strategy with tidal volumes of 4-8 cc/kg ideal body weight -Goal plateau pressures of 30 and driving pressures of 15 -Wean PEEP/FiO2  for SpO2 greater than 88 -Follow intermittent CXR and ABG PRN -Daily SAT/SBT -Continue VAP bundle -Stop precedex. Continue PAD bundle with Propofol and fentanyl infusion. Titrate to goal rass of -1. -Continue Ceftriaxone and azithromycin. TA Few GPC day 2. BC NGTD day 2. Narrow as cultures result. -Continue 5 days of solumedrol 40mg  daily. Stop on 6/4. -Will look  with [place] at RT effusion and consider drainage -Will consider restarting home nebs  HFrEF (12/24/20 EF 40-45%, G2DD, mildly reduced systolic RV, mild MR); SvO2 good Hx of VT on amio w/ICD Hx CAD s/p CABG 1996  Hx LBBB HTN PAF -Continue levophed. Stop vaso. Goal MAP greater than 65. Titrate to goal. -Continue heparin gtt at this time for [facility] -Continue home  Amiodarone 200mg  daily -Continue atorvastatin -Holding home entresto, spirinolactone, torsemide, imdur and carvedilol  AKI on CKD 3b BPH Phimosis- required bedside dilation by urology for foley placement 5/30 Creatinine 2.21>1.84>1.92.  -Ensure renal perfusion. Goal MAP 65 or greater. -Avoid neprotoxic drugs as possible. -Strict I&O's -Follow up AM creatinine -Will consider diuresis when off pressors  Leukocytosis with Fevers Tmax 102.5, WBC 21.8>17.7>17.1. ?secondary to precedex -Stop precedex -BLLE ultrasound -Drainage of right effusion with cultures if able -Obtain LFTs -Follow fever/wbc curve  Hypothyroidism -Continue home levothyroxine 24mcg daily per tube  Hyperglycemia,  possible exacerbated by steroids/ stress response. BG 251-350 -Blood Glucose goal 140-180. -Increased Lantus from 15 units to 30 units  Class 3 obesity -Weight loss education when appropriate  Baseline anxiety d/o -Continue Valium 2mg  TID  Best practice (right click and "Reselect all SmartList Selections" daily)  Diet:  TF Pain/Anxiety/Delirium protocol (if indicated): Yes (RASS goal -1) VAP protocol (if indicated): Yes DVT prophylaxis: Systemic AC GI prophylaxis: PPI Glucose control:  SSI/lantus Central venous access:  yes Arterial line:  yes Foley:  yes Mobility:  bed rest  PT consulted: [name] Last date of multidisciplinary goals of care discussion [pending] Code Status:  full code Disposition: ICU   I personally spent 32 minutes providing critical care not including any separately billable procedures  Redmond [employer].,  MSN, APRN, AGACNP-BC [facility]  05/11/2021 , 9:20 AM  Please see [email or web address] for pager details  If no response, please call [phone] After hours, please call Elink at [phone]

## 2021-05-11 NOTE — Progress Notes (Signed)
[place] for heparin Indication: atrial fibrillation  Allergies  Allergen Reactions  . Tussionex Pennkinetic Er [Hydrocod Polst-Cpm Polst Er] Other (See Comments)    UNSPECIFIED REACTION  > "caused prostate problems"  . Ace Inhibitors Cough    Patient Measurements: Height: 5\' 7"  (170.2 cm) Weight: 132.5 kg (292 lb 1.8 oz) IBW/kg (Calculated) : 66.1 Heparin Dosing Weight: 96.8 kg  Vital Signs: Temp: 98.4 F (36.9 C) (06/01 1058) Temp Source: Oral (06/01 1058) BP: 149/53 (06/01 0600) Pulse Rate: 89 (06/01 1400)  Labs: Recent Labs    04/21/2021 2321 05/09/21 0020 05/09/21 0116 05/09/21 0330 05/09/21 0409 05/09/21 0409 05/09/21 0431 05/09/21 0935 05/09/21 2044 05/09/21 2112 05/10/21 0445 05/11/21 0454 05/11/21 1325  HGB 16.5   < >  --    < > 17.2*  --   --   --  15.8 15.6 15.2 14.2  --   HCT 53.3*   < >  --    < > 54.3*  --   --   --  49.9 46.0 47.3 44.6  --   PLT 253  --   --   --  184  --   --   --  263  --  209 167  --   APTT  --   --   --   --   --    < >  --  46* 70*  --  85* 139* 124*  LABPROT  --   --  14.9  --   --   --   --   --   --   --   --   --   --   INR  --   --  1.2  --   --   --   --   --   --   --   --   --   --   HEPARINUNFRC  --   --   --   --   --   --   --  0.87*  --   --  0.83* 1.04*  --   CREATININE 2.14*  --   --   --  2.21*  --   --   --   --   --  1.84* 1.92*  --   TROPONINIHS 92*  --   --   --   --   --  276*  --   --   --   --   --   --    < > = values in this interval not displayed.    Estimated Creatinine Clearance: 42.9 mL/min (A) (by C-G formula based on SCr of 1.92 mg/dL (H)).   Medical History: Past Medical History:  Diagnosis Date  . AAA (abdominal aortic aneurysm) ([place])    followed by Dr. Bridgett Larsson  . Adenomatous colon polyp 01/2004  . Anemia    hx  . Automatic implantable cardioverter-defibrillator in situ   . AVM (arteriovenous malformation)   . BPH (benign prostatic hypertrophy)   .  CAD (coronary artery disease)    s/p CABGx2 in 1996  . Carotid artery stenosis    LCEA - Dr. Bridgett Larsson in 2013  . CHF (congestive heart failure) ([place])   . Complication of anesthesia    claustrophobic, unabe to lie on back more than 4 hours at time due to back  . COPD (chronic obstructive pulmonary disease) ([place])   . Diabetes mellitus    DIET CONTROLLED- pt  states that this was a misdiagnosis, he was treated while in the hospital  but returned home, loss a massive amount of weight and he has not had a problem with his blood sugar since. States everything has been normal for about 3 years.  . Diverticulosis   . Dyspnea   . Dysrhythmia    ICD-defibrillator  . Fatigue   . GERD (gastroesophageal reflux disease)   . H/O hiatal hernia   . History of radiation therapy 04/09/17-04/17/17   SBRT right lung 54 Gy in 3 fractions  . HLD (hyperlipidemia)   . Hypertension   . Hypothyroidism   . Ischemic cardiomyopathy   . Myocardial infarction ([place])   . 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 OSA on CPAP    AHI durign total sleep 14.69/hr, during REM 50.91/hr  . Peripheral vascular disease (HCC)    LCEA, L renal artery stent, bilat iliac stents, R SFA stenosis  . Tremor   . Ventricular tachycardia ([place]) 09/09/2014   Amiodarone was started after appropriate defibrillator shocks for ventricular tachycardia in October 2008    Assessment: 75 yo man to start heparin for afib while xarelto on hold with VDRF and AKI.  Last dose xarelto 5/28.    APTT remains above goal, close to correlating with heparin level earlier this am.  Goal of Therapy:  APTT 66-102 sec Heparin level 0.3-0.7 units/ml Monitor platelets by anticoagulation protocol: Yes   Plan:  -Reduce heparin to 1500 units/h -Recheck aPTT in 8h -Check aPTT and heparin level with am labs   [name], PharmD, BCPS, [facility] Clinical Pharmacist [phone] Please check AMION for all [place] numbers 05/11/2021

## 2021-05-11 NOTE — TOC Initial Note (Signed)
Transition of Care [facility]) - Initial/Assessment Note    Patient Details  Name: [name] MRN: [id number] Date of Birth: 02/09/1946  [facility]) CM/SW Contact:    Ella Bodo, RN Phone Number: 05/11/2021, 2:23 PM  Clinical Narrative: This is a 75 year old male with history of morbid obesity, heart failure with reduced ejection fraction, ventricular tachycardia, coronary artery disease, left bundle branch with, AAA, non-small cell lung cancer status post XRT, BPH, diabetes type 2, peripheral vascular disease who presents with hypercapnic respiratory failure.  PTA, pt resided at home with spouse, [name]. CM consult for LTAC, as patient failed SBT 5/31 and 6/1.  Left message for patient's wife, [name] at home and cell numbers to discuss potential LTAC transfer/offer choice.                      Expected Discharge Plan: Long Term Nursing Home Barriers to Discharge: Continued Medical Work up          Expected Discharge Plan and Services Expected Discharge Plan: [place]       Living arrangements for the past 2 months: Single Family Home                                      Prior Living Arrangements/Services Living arrangements for the past 2 months: Single Family Home Lives with:: Spouse                   Activities of Daily Living Home Assistive Devices/Equipment: Radio producer (specify quad or straight),CPAP ADL Screening (condition at time of admission) Patient's cognitive ability adequate to safely complete daily activities?: Yes Is the patient deaf or have difficulty hearing?: No Does the patient have difficulty seeing, even when wearing glasses/contacts?: No Does the patient have difficulty concentrating, remembering, or making decisions?: Yes Patient able to express need for assistance with ADLs?: Yes Does the patient have difficulty dressing or bathing?: No Independently performs ADLs?: Yes (appropriate for developmental age) Does  the patient have difficulty walking or climbing stairs?: Yes Weakness of Legs: None Weakness of Arms/Hands: None  Permission Sought/Granted                  Emotional Assessment Appearance:: Appears stated age Attitude/Demeanor/Rapport: Unable to Assess Affect (typically observed): Unable to Assess        Admission diagnosis:  Respiratory arrest ([place]) [R09.2] Respiratory failure ([place]) [J96.90] Elevated troponin I level [R77.8] Acute renal failure, unspecified acute renal failure type [facility]) [N17.9] Patient Active Problem List   Diagnosis Date Noted  . Respiratory failure ([place]) 05/09/2021  . Chronic systolic heart failure ([place]) 10/05/2020  . Paroxysmal atrial fibrillation ([place]) 10/05/2020  . Chronic bilateral low back pain without sciatica 07/14/2020  . DDD (degenerative disc disease), lumbar 07/14/2020  . Thiamine deficiency 04/03/2020  . Stage 3a chronic kidney disease ([place]) 03/23/2020  . Tremor, essential 03/23/2020  . Vitamin B12 deficiency anemia due to intrinsic factor deficiency 03/23/2020  . Solitary pulmonary nodule 09/18/2019  . Chronic respiratory failure with hypoxia ([place]) 12/01/2018  . Atherosclerosis of native arteries of extremity with intermittent claudication ([place]) 02/06/2018  . Carotid artery disease ([place]) 10/08/2015  . Carotid artery occlusion with infarction ([place]) 10/08/2015  . AAA (abdominal aortic aneurysm) without rupture ([place]) 04/02/2015  . Iron deficiency anemia 02/23/2015  . Prediabetes 02/17/2015  . Morbid obesity ([place]) 05/27/2014  . Chronic combined  systolic and diastolic CHF, NYHA class 3 ([place]) 03/02/2014  . Diastolic dysfunction- grade 2 03/02/2014  . CAD -SVG-RCA PCI '99, CFX PCI '99. Cath '05 and '08 - medical Rx 03/02/2014  . Biventricular ICD (implantable cardioverter-defibrillator) in place 02/24/2014  . Cardiomyopathy, ischemic- EF 20-25% 2D 02/25/14 11/24/2013  . Hx of CABG 11/24/2013  . PAD (peripheral artery disease) ([place])  11/24/2013  . Hyperlipidemia with target LDL less than 70 06/18/2013  . OSA on CPAP 04/18/2012  . Hypothyroidism 04/18/2012  . COPD mixed type ([place]) 10/14/2010  . Essential hypertension 11/22/2009  . BPH (benign prostatic hyperplasia) 11/22/2009  . GERD 03/23/2009   PCP:  [name], MD Pharmacy:   [id number] - [place], [place] [address] Phone: [phone] Fax: [phone]  [place], [address], [address] [place], [address] [place] [address] Phone: [phone] Fax: [phone]  [facility], [place] [address] [place] [address] Phone: [phone] Fax: [phone]     Social Determinants of Health ([place]) Interventions    Readmission Risk Interventions No flowsheet data found.  Reinaldo Raddle, RN, BSN  Trauma/Neuro ICU Case Manager [phone]

## 2021-05-11 NOTE — Progress Notes (Signed)
[place] for heparin Indication: atrial fibrillation  Allergies  Allergen Reactions  . Tussionex Pennkinetic Er [Hydrocod Polst-Cpm Polst Er] Other (See Comments)    UNSPECIFIED REACTION  > "caused prostate problems"  . Ace Inhibitors Cough    Patient Measurements: Height: 5\' 7"  (170.2 cm) Weight: 132.5 kg (292 lb 1.8 oz) IBW/kg (Calculated) : 66.1 Heparin Dosing Weight: 96.8 kg  Vital Signs: Temp: 98.4 F (36.9 C) (06/01 1058) Temp Source: Oral (06/01 1058) BP: 149/53 (06/01 0600) Pulse Rate: 77 (06/01 1545)  Labs: Recent Labs    04/22/2021 2321 05/09/21 0020 05/09/21 0116 05/09/21 0330 05/09/21 0409 05/09/21 0409 05/09/21 0431 05/09/21 0935 05/09/21 2044 05/09/21 2112 05/10/21 0445 05/11/21 0454 05/11/21 1325  HGB 16.5   < >  --    < > 17.2*  --   --   --  15.8 15.6 15.2 14.2  --   HCT 53.3*   < >  --    < > 54.3*  --   --   --  49.9 46.0 47.3 44.6  --   PLT 253  --   --   --  184  --   --   --  263  --  209 167  --   APTT  --   --   --   --   --    < >  --  46* 70*  --  85* 139* 124*  LABPROT  --   --  14.9  --   --   --   --   --   --   --   --   --   --   INR  --   --  1.2  --   --   --   --   --   --   --   --   --   --   HEPARINUNFRC  --   --   --   --   --   --   --  0.87*  --   --  0.83* 1.04*  --   CREATININE 2.14*  --   --   --  2.21*  --   --   --   --   --  1.84* 1.92*  --   TROPONINIHS 92*  --   --   --   --   --  276*  --   --   --   --   --   --    < > = values in this interval not displayed.    Estimated Creatinine Clearance: 42.9 mL/min (A) (by C-G formula based on SCr of 1.92 mg/dL (H)).   Medical History: Past Medical History:  Diagnosis Date  . AAA (abdominal aortic aneurysm) ([place])    followed by Dr. Bridgett Larsson  . Adenomatous colon polyp 01/2004  . Anemia    hx  . Automatic implantable cardioverter-defibrillator in situ   . AVM (arteriovenous malformation)   . BPH (benign prostatic hypertrophy)   .  CAD (coronary artery disease)    s/p CABGx2 in 1996  . Carotid artery stenosis    LCEA - Dr. Bridgett Larsson in 2013  . CHF (congestive heart failure) ([place])   . Complication of anesthesia    claustrophobic, unabe to lie on back more than 4 hours at time due to back  . COPD (chronic obstructive pulmonary disease) ([place])   . Diabetes mellitus    DIET CONTROLLED- pt  states that this was a misdiagnosis, he was treated while in the hospital  but returned home, loss a massive amount of weight and he has not had a problem with his blood sugar since. States everything has been normal for about 3 years.  . Diverticulosis   . Dyspnea   . Dysrhythmia    ICD-defibrillator  . Fatigue   . GERD (gastroesophageal reflux disease)   . H/O hiatal hernia   . History of radiation therapy 04/09/17-04/17/17   SBRT right lung 54 Gy in 3 fractions  . HLD (hyperlipidemia)   . Hypertension   . Hypothyroidism   . Ischemic cardiomyopathy   . Myocardial infarction ([place])   . 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 OSA on CPAP    AHI durign total sleep 14.69/hr, during REM 50.91/hr  . Peripheral vascular disease (HCC)    LCEA, L renal artery stent, bilat iliac stents, R SFA stenosis  . Tremor   . Ventricular tachycardia ([place]) 09/09/2014   Amiodarone was started after appropriate defibrillator shocks for ventricular tachycardia in October 2008    Assessment: 75 yo man to start heparin for afib while xarelto on hold with VDRF and AKI.  Last dose xarelto 5/28.    Heparin was on held for chest tube placement - which was unable to be completed. Per Dr. Tamala Julian hold heparin until 0800 on 6/2.   Goal of Therapy:  APTT 66-102 sec Heparin level 0.3-0.7 units/ml Monitor platelets by anticoagulation protocol: Yes   Plan:  Start heparin at 1500 units/hr at 0800 on 6/2  Check 8 hr heparin level and aPTT   Cristela Felt, PharmD Clinical Pharmacist  05/11/2021

## 2021-05-11 NOTE — Progress Notes (Signed)
ANTICOAGULATION CONSULT NOTE - Follow Up Consult  Pharmacy Consult for heparin Indication: atrial fibrillation  Labs: Recent Labs    04/14/2021 2321 05/09/21 0020 05/09/21 0116 05/09/21 0330 05/09/21 0409 05/09/21 0409 05/09/21 0431 05/09/21 0935 05/09/21 2044 05/09/21 2112 05/10/21 0445 05/11/21 0454  HGB 16.5   < >  --    < > 17.2*  --   --   --  15.8 15.6 15.2 14.2  HCT 53.3*   < >  --    < > 54.3*  --   --   --  49.9 46.0 47.3 44.6  PLT 253  --   --   --  184  --   --   --  263  --  209 167  APTT  --   --   --   --   --    < >  --  46* 70*  --  85* 139*  LABPROT  --   --  14.9  --   --   --   --   --   --   --   --   --   INR  --   --  1.2  --   --   --   --   --   --   --   --   --   HEPARINUNFRC  --   --   --   --   --   --   --  0.87*  --   --  0.83* 1.04*  CREATININE 2.14*  --   --   --  2.21*  --   --   --   --   --  1.84*  --   TROPONINIHS 92*  --   --   --   --   --  276*  --   --   --   --   --    < > = values in this interval not displayed.    Assessment: 75yo male supratherapeutic on heparin after two PTTs at goal; no gtt issues or signs of bleeding per RN.  Goal of Therapy:  aPTT 66-102 seconds   Plan:  Will decrease heparin gtt by 1-2 units/kgABW/hr to 1700 units/hr and check PTT in 8 hours.    Wynona Neat, PharmD, BCPS  05/11/2021,6:21 AM

## 2021-05-11 NOTE — Progress Notes (Signed)
Venous lower ext  has been completed. Refer to [facility] under chart review to view preliminary results.   05/11/2021  12:01 PM [name], [name]

## 2021-05-11 NOTE — Procedures (Signed)
Attempted pleural catheter on R, no fluid aspirated and hard to get clear target on [place]: it may just be all consolidated lung.  Procedure abandoned.  Check CXR.

## 2021-05-11 DEATH — deceased

## 2021-05-12 ENCOUNTER — Inpatient Hospital Stay (HOSPITAL_COMMUNITY): Payer: Medicare Other

## 2021-05-12 DIAGNOSIS — I255 Ischemic cardiomyopathy: Secondary | ICD-10-CM | POA: Diagnosis not present

## 2021-05-12 DIAGNOSIS — R092 Respiratory arrest: Secondary | ICD-10-CM | POA: Diagnosis not present

## 2021-05-12 LAB — CBC
HCT: 44.9 % (ref 39.0–52.0)
Hemoglobin: 14 g/dL (ref 13.0–17.0)
MCH: 30.8 pg (ref 26.0–34.0)
MCHC: 31.2 g/dL (ref 30.0–36.0)
MCV: 98.7 fL (ref 80.0–100.0)
Platelets: 166 10*3/uL (ref 150–400)
RBC: 4.55 MIL/uL (ref 4.22–5.81)
RDW: 13.7 % (ref 11.5–15.5)
WBC: 17.9 10*3/uL — ABNORMAL HIGH (ref 4.0–10.5)
nRBC: 0.2 % (ref 0.0–0.2)

## 2021-05-12 LAB — ECHOCARDIOGRAM LIMITED
Area-P 1/2: 3.17 cm2
Height: 67 in
MV M vel: 3.73 m/s
MV Peak grad: 55.7 mmHg
P 1/2 time: 833 msec
S' Lateral: 6.1 cm
Weight: 4617.31 oz

## 2021-05-12 LAB — HEPARIN LEVEL (UNFRACTIONATED): Heparin Unfractionated: 0.44 IU/mL (ref 0.30–0.70)

## 2021-05-12 LAB — BASIC METABOLIC PANEL
Anion gap: 14 (ref 5–15)
BUN: 60 mg/dL — ABNORMAL HIGH (ref 8–23)
CO2: 23 mmol/L (ref 22–32)
Calcium: 8.3 mg/dL — ABNORMAL LOW (ref 8.9–10.3)
Chloride: 109 mmol/L (ref 98–111)
Creatinine, Ser: 1.71 mg/dL — ABNORMAL HIGH (ref 0.61–1.24)
GFR, Estimated: 41 mL/min — ABNORMAL LOW (ref >60)
Glucose, Bld: 269 mg/dL — ABNORMAL HIGH (ref 70–99)
Potassium: 4.5 mmol/L (ref 3.5–5.1)
Sodium: 146 mmol/L — ABNORMAL HIGH (ref 135–145)

## 2021-05-12 LAB — MAGNESIUM: Magnesium: 2.7 mg/dL — ABNORMAL HIGH (ref 1.7–2.4)

## 2021-05-12 LAB — GLUCOSE, CAPILLARY
Glucose-Capillary: 255 mg/dL — ABNORMAL HIGH (ref 70–99)
Glucose-Capillary: 213 mg/dL — ABNORMAL HIGH (ref 70–99)
Glucose-Capillary: 228 mg/dL — ABNORMAL HIGH (ref 70–99)
Glucose-Capillary: 302 mg/dL — ABNORMAL HIGH (ref 70–99)
Glucose-Capillary: 291 mg/dL — ABNORMAL HIGH (ref 70–99)

## 2021-05-12 LAB — APTT: aPTT: 45 seconds — ABNORMAL HIGH (ref 24–36)

## 2021-05-12 LAB — POTASSIUM: Potassium: 4.8 mmol/L (ref 3.5–5.1)

## 2021-05-12 LAB — PHOSPHORUS: Phosphorus: 2.7 mg/dL (ref 2.5–4.6)

## 2021-05-12 MED ORDER — INSULIN ASPART 100 UNIT/ML IJ SOLN
3.0000 [IU] | INTRAMUSCULAR | Status: DC
  Administered 2021-05-12 – 2021-05-13 (×7): 3 [IU] via SUBCUTANEOUS

## 2021-05-12 MED ORDER — METOLAZONE 5 MG PO TABS
5.0000 mg | ORAL_TABLET | Freq: Once | ORAL | Status: AC
  Administered 2021-05-12: 5 mg via ORAL
  Filled 2021-05-12: qty 1

## 2021-05-12 MED ORDER — DEXMEDETOMIDINE HCL IN NACL 400 MCG/100ML IV SOLN
0.2000 ug/kg/h | INTRAVENOUS | Status: DC
  Administered 2021-05-12: 0.4 ug/kg/h via INTRAVENOUS
  Administered 2021-05-12 (×2): 0.6 ug/kg/h via INTRAVENOUS
  Administered 2021-05-13: 0.3 ug/kg/h via INTRAVENOUS
  Administered 2021-05-13: 0.2 ug/kg/h via INTRAVENOUS
  Administered 2021-05-13: 0.6 ug/kg/h via INTRAVENOUS
  Administered 2021-05-14 (×2): 0.1 ug/kg/h via INTRAVENOUS
  Administered 2021-05-15: 0.4 ug/kg/h via INTRAVENOUS
  Administered 2021-05-16: 0.2 ug/kg/h via INTRAVENOUS
  Administered 2021-05-17: 0.8 ug/kg/h via INTRAVENOUS
  Administered 2021-05-17: 0.9 ug/kg/h via INTRAVENOUS
  Administered 2021-05-18: 0.3 ug/kg/h via INTRAVENOUS
  Administered 2021-05-18: 0.8 ug/kg/h via INTRAVENOUS
  Administered 2021-05-19 (×2): 1 ug/kg/h via INTRAVENOUS
  Administered 2021-05-19: 0.6 ug/kg/h via INTRAVENOUS
  Administered 2021-05-21: 0.2 ug/kg/h via INTRAVENOUS
  Administered 2021-05-21: 0.4 ug/kg/h via INTRAVENOUS
  Administered 2021-05-22: 0.1 ug/kg/h via INTRAVENOUS
  Administered 2021-05-23: 0.4 ug/kg/h via INTRAVENOUS
  Administered 2021-05-23: 0.3 ug/kg/h via INTRAVENOUS
  Administered 2021-05-24: 0.7 ug/kg/h via INTRAVENOUS
  Administered 2021-05-24: 0.6 ug/kg/h via INTRAVENOUS
  Administered 2021-05-24: 0.8 ug/kg/h via INTRAVENOUS
  Administered 2021-05-24: 0.6 ug/kg/h via INTRAVENOUS
  Administered 2021-05-25 (×5): 0.8 ug/kg/h via INTRAVENOUS
  Administered 2021-05-25 – 2021-05-26 (×3): 1 ug/kg/h via INTRAVENOUS
  Administered 2021-05-26: 0.9 ug/kg/h via INTRAVENOUS
  Administered 2021-05-26: 0.5 ug/kg/h via INTRAVENOUS
  Administered 2021-05-26: 0.9 ug/kg/h via INTRAVENOUS
  Administered 2021-05-26: 1 ug/kg/h via INTRAVENOUS
  Administered 2021-05-27: 0.9 ug/kg/h via INTRAVENOUS
  Administered 2021-05-27: 0.8 ug/kg/h via INTRAVENOUS
  Administered 2021-05-27: 0.7 ug/kg/h via INTRAVENOUS
  Administered 2021-05-27 (×3): 0.9 ug/kg/h via INTRAVENOUS
  Administered 2021-05-27: 1 ug/kg/h via INTRAVENOUS
  Administered 2021-05-28: 1.2 ug/kg/h via INTRAVENOUS
  Administered 2021-05-28: 1.1 ug/kg/h via INTRAVENOUS
  Administered 2021-05-28: 0.4 ug/kg/h via INTRAVENOUS
  Administered 2021-05-28: 0.3 ug/kg/h via INTRAVENOUS
  Administered 2021-05-29 – 2021-05-30 (×5): 0.4 ug/kg/h via INTRAVENOUS
  Filled 2021-05-12 (×56): qty 100

## 2021-05-12 MED ORDER — FENTANYL 2500MCG IN NS 250ML (10MCG/ML) PREMIX INFUSION: 0.0000 ug/h | INTRAVENOUS | Status: DC

## 2021-05-12 MED ORDER — FUROSEMIDE 10 MG/ML IJ SOLN
60.0000 mg | Freq: Once | INTRAMUSCULAR | Status: AC
  Administered 2021-05-12: 60 mg via INTRAVENOUS
  Filled 2021-05-12: qty 6

## 2021-05-12 MED ORDER — IPRATROPIUM BROMIDE 0.02 % IN SOLN
0.5000 mg | Freq: Four times a day (QID) | RESPIRATORY_TRACT | Status: DC
  Administered 2021-05-12 – 2021-05-20 (×33): 0.5 mg via RESPIRATORY_TRACT
  Filled 2021-05-12 (×33): qty 2.5

## 2021-05-12 NOTE — TOC Progression Note (Signed)
Transition of Care [facility]) - Progression Note    Patient Details  Name: [name] MRN: [id number] Date of Birth: Aug 30, 1946  [facility]) CM/SW Contact  Graves-Bigelow, Ocie Cornfield, RN Phone Number: 05/12/2021, 12:49 PM  Clinical Narrative:  Previous Case Manager offered choice for LTAC. [facility]. Awaiting to hear back from spouse regarding facility choice. Spouse has to discuss with family regarding LTAC options. Per MD, the patient will stay over the weekend in the unit. The patient may be able to transition next week. Transitions of Care Team to continue to follow for disposition needs.   Expected Discharge Plan: Long Term Nursing Home Barriers to Discharge: Continued Medical Work up  Expected Discharge Plan and Services Expected Discharge Plan: [place]   Living arrangements for the past 2 months: Single Family Home  Readmission Risk Interventions No flowsheet data found.

## 2021-05-12 NOTE — Progress Notes (Addendum)
[place] for heparin Indication: atrial fibrillation  Allergies  Allergen Reactions  . Tussionex Pennkinetic Er [Hydrocod Polst-Cpm Polst Er] Other (See Comments)    UNSPECIFIED REACTION  > "caused prostate problems"  . Ace Inhibitors Cough    Patient Measurements: Height: 5\' 7"  (170.2 cm) Weight: 130.9 kg (288 lb 9.3 oz) IBW/kg (Calculated) : 66.1 Heparin Dosing Weight: 96.8 kg  Vital Signs: Temp: 98.2 F (36.8 C) (06/02 1116) Temp Source: Oral (06/02 1116) BP: 97/37 (06/02 1540) Pulse Rate: 77 (06/02 1540)  Labs: Recent Labs    05/10/21 0445 05/11/21 0454 05/11/21 1325 05/12/21 0509 05/12/21 1504  HGB 15.2 14.2  --  14.0  --   HCT 47.3 44.6  --  44.9  --   PLT 209 167  --  166  --   APTT 85* 139* 124*  --  45*  HEPARINUNFRC 0.83* 1.04*  --   --  0.44  CREATININE 1.84* 1.92*  --  1.71*  --     Estimated Creatinine Clearance: 47.8 mL/min (A) (by C-G formula based on SCr of 1.71 mg/dL (H)).   Medical History: Past Medical History:  Diagnosis Date  . AAA (abdominal aortic aneurysm) ([place])    followed by Dr. Bridgett Larsson  . Adenomatous colon polyp 01/2004  . Anemia    hx  . Automatic implantable cardioverter-defibrillator in situ   . AVM (arteriovenous malformation)   . BPH (benign prostatic hypertrophy)   . CAD (coronary artery disease)    s/p CABGx2 in 1996  . Carotid artery stenosis    LCEA - Dr. Bridgett Larsson in 2013  . CHF (congestive heart failure) ([place])   . Complication of anesthesia    claustrophobic, unabe to lie on back more than 4 hours at time due to back  . COPD (chronic obstructive pulmonary disease) ([place])   . Diabetes mellitus    DIET CONTROLLED- pt states that this was a misdiagnosis, he was treated while in the hospital  but returned home, loss a massive amount of weight and he has not had a problem with his blood sugar since. States everything has been normal for about 3 years.  . Diverticulosis   . Dyspnea   .  Dysrhythmia    ICD-defibrillator  . Fatigue   . GERD (gastroesophageal reflux disease)   . H/O hiatal hernia   . History of radiation therapy 04/09/17-04/17/17   SBRT right lung 54 Gy in 3 fractions  . HLD (hyperlipidemia)   . Hypertension   . Hypothyroidism   . Ischemic cardiomyopathy   . Myocardial infarction ([place])   . 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 OSA on CPAP    AHI durign total sleep 14.69/hr, during REM 50.91/hr  . Peripheral vascular disease (HCC)    LCEA, L renal artery stent, bilat iliac stents, R SFA stenosis  . Tremor   . Ventricular tachycardia ([place]) 09/09/2014   Amiodarone was started after appropriate defibrillator shocks for ventricular tachycardia in October 2008    Assessment: 75 yo man to start heparin for afib while xarelto on hold with VDRF and AKI.  Last dose xarelto 5/28.    He had a chest tube placed 6/1 and heparin was restarted this morning at 8am -heparin level = 0.45 (possible influence of PT -aPTT= 45 -heparin rate was 1700-1850 units/hr prior to chest tube placement  Goal of Therapy:  APTT 66-102 sec Heparin level 0.3-0.7 units/ml Monitor platelets by anticoagulation protocol: Yes  Plan:  -Increase heparin to 1750 units/hr -aPTT, heparin level and CBC in am  Hildred Laser, PharmD Clinical Pharmacist **Pharmacist phone directory can now be found on [email or web address] ([email or web address]).  Listed under [place].

## 2021-05-12 NOTE — Progress Notes (Signed)
NAME:  JULIANO MCEACHIN, MRN:  [id number], DOB:  1945/12/30, LOS: 3 ADMISSION DATE:  05/07/2021, CONSULTATION DATE:  05/09/2021 REFERRING MD:  Dr. Randal Buba, CHIEF COMPLAINT:  SOB  History of Present Illness:  Mr. Maharaj is a 75 yo M with past medical hx as below presenting from home with complaints of SOB/ respiratory distress on 5/29.  Treated by EMS with duonebs, epi, solumedrol, and mag without improvement, requiring BVM.  On arrival to ER, patient required intubation as he was peri- arrest, unresponsive, bradycardic, temp 95.9, hypotensive, and apneic with diffuse apical wheeze requiring emergent intubation on arrival.  Noted to have pink frothy secretions on intubation.   PCCM called for admit.   He remains critically ill in the [facility] ICU  Pertinent  Medical History  Morbid obesity, HFrEF (12/24/20 EF 40-45%, G2DD, PAF, mildly reduced systolic RV, mild MR), VT on amio/ ICD, CAD s/p CABG 1996, LBBB, HLD, COPD, OSA on CPAP, AAA (s/p repair 2015), NSCL lung CA s/p XRT 2018, anemia, BPH, DMT2, GERD, hiatal hernia, PVD, tremor  Significant Hospital Events: Including procedures, antibiotic start and stop dates in addition to other pertinent events   . 5/30 admitted for respiratory failure/ intubated, empiric CAP coverage/ duresis . 6/1 BLLE Duplex> no DVT, unable to complete thoracentesis due to anatomy   Interim History / Subjective:  No acute events overnight  Tmax 101  -2.9L past 24, 5.5L uop, +1.9L admission  On exam on levophed at 8 mcg, heparin, precedex at 0.04  Intubated/sedated  Unable to obtain subjective evaluation due to patient status  Objective   Blood pressure (!) 104/40, pulse 80, temperature (!) 100.4 F (38 C), temperature source Oral, resp. rate (!) 24, height 5\' 7"  (1.702 m), weight 130.9 kg, SpO2 98 %. CVP:  [8 mmHg-10 mmHg] 10 mmHg  Vent Mode: PRVC FiO2 (%):  [40 %] 40 % Set Rate:  [24 bmp] 24 bmp Vt Set:  [530 mL] 530 mL PEEP:  [5 cmH20-8 cmH20] 5 cmH20 Plateau  Pressure:  [19 cmH20-24 cmH20] 19 cmH20   Intake/Output Summary (Last 24 hours) at 05/12/2021 0933 Last data filed at 05/12/2021 0900 Gross per 24 hour  Intake 2281.7 ml  Output 5735 ml  Net -3453.3 ml   Filed Weights   05/10/21 0454 05/11/21 0527 05/12/21 0500  Weight: 131.1 kg 132.5 kg 130.9 kg    Examination: General:  Obese, ill appearing, in bed HEENT: MM pink/moist, icteric/anicteric, ETT, OGT, RT IJ cvc c/d/i Neuro: GCS 10T, FC, eyes to voice, RASS 0, PERRL 75mm CV: S1S2, paced, no m/r/g appreciated PULM: Clear in the upper lobes, diminished in the lower lobes, chest expansion symmetric, trachea midline  GI: soft, bsx4 active, non distended Renal: foley in place, yellow urine, clear Extremities: warm/dry, generalized edema, capillary refill less than 3 seconds  Skin: no rashes or lesions   Labs/imaging that I havepersonally reviewed  (right click and "Reselect all SmartList Selections" daily)  CXR- rt effusion, bl atelectasis BMP- creat improving, hyperna CBC- WBC up slightly Doppler LE- neg for DVT.  CXR- RT effusion continues,  Resolved Hospital Problem list    Assessment & Plan:   Shock- suspected septic with pneumonia as source, complicated by poor cardiac function Acute hypoxic/ hypercarbic respiratory failure with sL>R multifocal infiltrates c/w CAP Hx of lung cancer with 3 recurrences with baseline abnormal CXR COPD questionable flare Failed SBT 5/31, 6/1, 6/2 due to WOB and agitation. Diuresed with 60 lasix 6/1. Not enough fluid on 6/1 for thoracentesis. -  Diurese today with 60 lasix and metalazone -Continue Ceftriaxone and Axithromycin. TA final with normal respiratory flora. BC pending NGTD day 3 -LTVV strategy with tidal volumes of 4-8 cc/kg ideal body weight -Goal plateau pressures of 30 and driving pressures of 15 -Wean PEEP/FiO2 for SpO2 greater than 92 -Follow intermittent CXR and ABG PRN -Continue VAP bundle -Continue PAD bundle with precedex and  fentanyl. Goal RASS 0. Titrate medication to foal.  HFrEF (12/24/20 EF 40-45%, G2DD, mildly reduced systolic RV, mild MR); SvO2 good Hx of VT on amio w/ICD Hx CAD s/p CABG 1996  Hx LBBB HTN PAF -Continue diuresis as discussed -Continue levophed. Goal map greater than  65. Titrate to goal pressure -Continue Heparin GTT for [facility] -Continue home amiodarone 200mg  daily. -Continue atorvastatin -Continue to hold home entersto, spirinolactone, torsemide, imdur, and carvedilol due to vasopressor requirements.  AKI on CKD 3b BPH Phimosis- required bedside dilation by urology for foley placement 5/30 Creatinine 2.21>1.84>1.92>1.71  -Diuresis as discussed -Ensure renal perfusion. Goal MAP 65 or greater. -Avoid neprotoxic drugs as possible. -Strict I&O's -Follow up AM creatinine -Start home proscar  Leukocytosis with Fevers Tmax 101, WBC 21.8>17.7>17.1>17.9, suspect secondary to steroids, LE Doppler neg for dvt  -Continue CAP coverage as discussed -stop solumedrol -continue to monitor fever/wbc curve  Hypothyroidism -Continue home levothyroxine 50 mcg daily per tube  Hyperglycemia,  BG 213-269 possible exacerbated by steroids/ stress response.  -Continue 30mg  lantus BID -Adding 3 units novolog TF coverage  Class 3 obesity -Weight loss education when appropriate  Baseline anxiety d/o -Continue Valium 2mg  BID  Best practice (right click and "Reselect all SmartList Selections" daily)  Diet:  TF Pain/Anxiety/Delirium protocol (if indicated): Yes (RASS goal 0) VAP protocol (if indicated): Yes DVT prophylaxis: Systemic AC GI prophylaxis: PPI Glucose control:  SSI/lantus Central venous access:  Yes, and it is still needed Arterial line:  no Foley:  Yes, and it is still needed Mobility:  bed rest  PT consulted: [name] Last date of multidisciplinary goals of care discussion [pending] Code Status:  full code Disposition: ICU   I personally spent 31 minutes providing critical care  not including any separately billable procedures  Redmond School., MSN, APRN, AGACNP-BC [facility]  05/12/2021 , 9:33 AM  Please see [email or web address] for pager details  If no response, please call [phone] After hours, please call [place] at [phone]

## 2021-05-12 NOTE — Progress Notes (Signed)
*  PRELIMINARY RESULTS* Echocardiogram 2D Echocardiogram LIMITED has been performed.  Leavy Cella 05/12/2021, 11:53 AM

## 2021-05-13 DIAGNOSIS — J9601 Acute respiratory failure with hypoxia: Secondary | ICD-10-CM | POA: Diagnosis not present

## 2021-05-13 DIAGNOSIS — D696 Thrombocytopenia, unspecified: Secondary | ICD-10-CM | POA: Diagnosis not present

## 2021-05-13 DIAGNOSIS — J810 Acute pulmonary edema: Secondary | ICD-10-CM

## 2021-05-13 DIAGNOSIS — Z9911 Dependence on respirator [ventilator] status: Secondary | ICD-10-CM

## 2021-05-13 DIAGNOSIS — J9602 Acute respiratory failure with hypercapnia: Secondary | ICD-10-CM | POA: Diagnosis not present

## 2021-05-13 LAB — GLUCOSE, CAPILLARY
Glucose-Capillary: 159 mg/dL — ABNORMAL HIGH (ref 70–99)
Glucose-Capillary: 186 mg/dL — ABNORMAL HIGH (ref 70–99)
Glucose-Capillary: 191 mg/dL — ABNORMAL HIGH (ref 70–99)
Glucose-Capillary: 198 mg/dL — ABNORMAL HIGH (ref 70–99)
Glucose-Capillary: 208 mg/dL — ABNORMAL HIGH (ref 70–99)
Glucose-Capillary: 233 mg/dL — ABNORMAL HIGH (ref 70–99)
Glucose-Capillary: 288 mg/dL — ABNORMAL HIGH (ref 70–99)

## 2021-05-13 LAB — BASIC METABOLIC PANEL
Anion gap: 9 (ref 5–15)
BUN: 69 mg/dL — ABNORMAL HIGH (ref 8–23)
CO2: 27 mmol/L (ref 22–32)
Calcium: 9 mg/dL (ref 8.9–10.3)
Chloride: 113 mmol/L — ABNORMAL HIGH (ref 98–111)
Creatinine, Ser: 1.68 mg/dL — ABNORMAL HIGH (ref 0.61–1.24)
GFR, Estimated: 42 mL/min — ABNORMAL LOW (ref >60)
Glucose, Bld: 247 mg/dL — ABNORMAL HIGH (ref 70–99)
Potassium: 4.9 mmol/L (ref 3.5–5.1)
Sodium: 149 mmol/L — ABNORMAL HIGH (ref 135–145)

## 2021-05-13 LAB — CBC
HCT: 46.2 % (ref 39.0–52.0)
Hemoglobin: 14.7 g/dL (ref 13.0–17.0)
MCH: 31.3 pg (ref 26.0–34.0)
MCHC: 31.8 g/dL (ref 30.0–36.0)
MCV: 98.3 fL (ref 80.0–100.0)
Platelets: 111 10*3/uL — ABNORMAL LOW (ref 150–400)
RBC: 4.7 MIL/uL (ref 4.22–5.81)
RDW: 13.5 % (ref 11.5–15.5)
WBC: 11.3 10*3/uL — ABNORMAL HIGH (ref 4.0–10.5)
nRBC: 0.4 % — ABNORMAL HIGH (ref 0.0–0.2)

## 2021-05-13 LAB — HEPARIN LEVEL (UNFRACTIONATED): Heparin Unfractionated: 0.76 IU/mL — ABNORMAL HIGH (ref 0.30–0.70)

## 2021-05-13 LAB — PHOSPHORUS: Phosphorus: 3 mg/dL (ref 2.5–4.6)

## 2021-05-13 LAB — MAGNESIUM: Magnesium: 3.1 mg/dL — ABNORMAL HIGH (ref 1.7–2.4)

## 2021-05-13 LAB — APTT: aPTT: 77 seconds — ABNORMAL HIGH (ref 24–36)

## 2021-05-13 MED ORDER — FREE WATER
100.0000 mL | Status: DC
  Administered 2021-05-13 – 2021-05-14 (×6): 100 mL

## 2021-05-13 MED ORDER — INSULIN ASPART 100 UNIT/ML IJ SOLN
0.0000 [IU] | INTRAMUSCULAR | Status: DC
  Administered 2021-05-13 (×3): 4 [IU] via SUBCUTANEOUS
  Administered 2021-05-14: 7 [IU] via SUBCUTANEOUS
  Administered 2021-05-14: 4 [IU] via SUBCUTANEOUS
  Administered 2021-05-14: 7 [IU] via SUBCUTANEOUS
  Administered 2021-05-14: 4 [IU] via SUBCUTANEOUS
  Administered 2021-05-14: 7 [IU] via SUBCUTANEOUS
  Administered 2021-05-15 (×3): 4 [IU] via SUBCUTANEOUS
  Administered 2021-05-15 (×2): 7 [IU] via SUBCUTANEOUS
  Administered 2021-05-15 – 2021-05-16 (×6): 4 [IU] via SUBCUTANEOUS
  Administered 2021-05-16 (×2): 7 [IU] via SUBCUTANEOUS
  Administered 2021-05-17: 4 [IU] via SUBCUTANEOUS
  Administered 2021-05-17: 11 [IU] via SUBCUTANEOUS
  Administered 2021-05-17: 7 [IU] via SUBCUTANEOUS
  Administered 2021-05-17 (×2): 4 [IU] via SUBCUTANEOUS
  Administered 2021-05-18 (×3): 11 [IU] via SUBCUTANEOUS
  Administered 2021-05-18 (×2): 4 [IU] via SUBCUTANEOUS
  Administered 2021-05-18: 7 [IU] via SUBCUTANEOUS
  Administered 2021-05-18: 11 [IU] via SUBCUTANEOUS
  Administered 2021-05-19: 7 [IU] via SUBCUTANEOUS
  Administered 2021-05-19: 11 [IU] via SUBCUTANEOUS
  Administered 2021-05-19: 15 [IU] via SUBCUTANEOUS

## 2021-05-13 MED ORDER — FUROSEMIDE 10 MG/ML IJ SOLN
60.0000 mg | Freq: Once | INTRAMUSCULAR | Status: AC
  Administered 2021-05-13: 60 mg via INTRAVENOUS
  Filled 2021-05-13: qty 6

## 2021-05-13 MED ORDER — INSULIN ASPART 100 UNIT/ML IJ SOLN: 0.0000 [IU] | INTRAMUSCULAR | Status: DC

## 2021-05-13 MED ORDER — ARGATROBAN 50 MG/50ML IV SOLN
0.9000 ug/kg/min | INTRAVENOUS | Status: DC
  Administered 2021-05-13: 0.5 ug/kg/min via INTRAVENOUS
  Administered 2021-05-14 (×2): 0.75 ug/kg/min via INTRAVENOUS
  Filled 2021-05-13 (×4): qty 50

## 2021-05-13 MED ORDER — SODIUM CHLORIDE 0.9% FLUSH
3.0000 mL | Freq: Two times a day (BID) | INTRAVENOUS | Status: DC
  Administered 2021-05-13 – 2021-05-22 (×16): 3 mL via INTRAVENOUS

## 2021-05-13 MED ORDER — INSULIN ASPART 100 UNIT/ML IJ SOLN
4.0000 [IU] | INTRAMUSCULAR | Status: DC
  Administered 2021-05-13 – 2021-05-14 (×5): 4 [IU] via SUBCUTANEOUS

## 2021-05-13 MED ORDER — ASPIRIN 81 MG PO CHEW
81.0000 mg | CHEWABLE_TABLET | Freq: Every day | ORAL | Status: DC
  Administered 2021-05-13 – 2021-06-01 (×20): 81 mg
  Filled 2021-05-13 (×20): qty 1

## 2021-05-13 MED ORDER — SODIUM CHLORIDE 0.9 % IV SOLN: 250.0000 mL | INTRAVENOUS | Status: DC | PRN

## 2021-05-13 MED ORDER — SODIUM CHLORIDE 0.9% FLUSH: 3.0000 mL | INTRAVENOUS | Status: DC | PRN

## 2021-05-13 NOTE — Progress Notes (Signed)
ANTICOAGULATION CONSULT NOTE - Initial Consult  Pharmacy Consult for argatroban Indication: HIT  Allergies  Allergen Reactions  . Heparin Other (See Comments)    HIT antibody pending 05/13/21  . Tussionex Pennkinetic Er [Hydrocod Polst-Cpm Polst Er] Other (See Comments)    UNSPECIFIED REACTION  > "caused prostate problems"  . Ace Inhibitors Cough    Patient Measurements: Height: 5\' 7"  (170.2 cm) Weight: 125.9 kg (277 lb 9 oz) IBW/kg (Calculated) : 66.1 Heparin Dosing Weight: 125kg  Vital Signs: Temp: 98.5 F (36.9 C) (06/03 1633) Temp Source: Oral (06/03 1633) BP: 145/66 (06/03 1700) Pulse Rate: 93 (06/03 1700)  Labs: Recent Labs    05/11/21 0454 05/11/21 1325 05/12/21 0509 05/12/21 1504 05/13/21 0412  HGB 14.2  --  14.0  --  14.7  HCT 44.6  --  44.9  --  46.2  PLT 167  --  166  --  111*  APTT 139* 124*  --  45* 77*  HEPARINUNFRC 1.04*  --   --  0.44 0.76*  CREATININE 1.92*  --  1.71*  --  1.68*    Estimated Creatinine Clearance: 47.6 mL/min (A) (by C-G formula based on SCr of 1.68 mg/dL (H)).   Medical History: Past Medical History:  Diagnosis Date  . AAA (abdominal aortic aneurysm) ([place])    followed by Dr. Bridgett Larsson  . Adenomatous colon polyp 01/2004  . Anemia    hx  . Automatic implantable cardioverter-defibrillator in situ   . AVM (arteriovenous malformation)   . BPH (benign prostatic hypertrophy)   . CAD (coronary artery disease)    s/p CABGx2 in 1996  . Carotid artery stenosis    LCEA - Dr. Bridgett Larsson in 2013  . CHF (congestive heart failure) ([place])   . Complication of anesthesia    claustrophobic, unabe to lie on back more than 4 hours at time due to back  . COPD (chronic obstructive pulmonary disease) ([place])   . Diabetes mellitus    DIET CONTROLLED- pt states that this was a misdiagnosis, he was treated while in the hospital  but returned home, loss a massive amount of weight and he has not had a problem with his blood sugar since. States everything has been  normal for about 3 years.  . Diverticulosis   . Dyspnea   . Dysrhythmia    ICD-defibrillator  . Fatigue   . GERD (gastroesophageal reflux disease)   . H/O hiatal hernia   . History of radiation therapy 04/09/17-04/17/17   SBRT right lung 54 Gy in 3 fractions  . HLD (hyperlipidemia)   . Hypertension   . Hypothyroidism   . Ischemic cardiomyopathy   . Myocardial infarction ([place])   . 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 OSA on CPAP    AHI durign total sleep 14.69/hr, during REM 50.91/hr  . Peripheral vascular disease (HCC)    LCEA, L renal artery stent, bilat iliac stents, R SFA stenosis  . Tremor   . Ventricular tachycardia ([place]) 09/09/2014   Amiodarone was started after appropriate defibrillator shocks for ventricular tachycardia in October 2008     Assessment: 41 yoM on Xarelto PTA for hx AFib started on IV heparin while in renal failure. Pltc dropped today from 253 baseline to 111. H/H stable. 4Ts score ~4-5. Pharmacy asked to start argatroban while ruling out HIT. LFTs wnl 6/1, AKI resolving.   Goal of Therapy:  aPTT 50-90 seconds Monitor platelets by anticoagulation protocol: Yes   Plan:  -Stop all  heparin products -Start argatroban 0.5 mcg/kg/min -Check aPTT q4h until therapeutic -Heparin allergy added tentatively -F/U HIT antibody results, no SRA orderded for now   [name], PharmD, BCPS, [facility] Clinical Pharmacist [phone] Please check AMION for all [facility] Pharmacy numbers 05/13/2021

## 2021-05-13 NOTE — Progress Notes (Signed)
[place] for heparin Indication: atrial fibrillation  Allergies  Allergen Reactions  . Tussionex Pennkinetic Er [Hydrocod Polst-Cpm Polst Er] Other (See Comments)    UNSPECIFIED REACTION  > "caused prostate problems"  . Ace Inhibitors Cough    Patient Measurements: Height: 5\' 7"  (170.2 cm) Weight: 130.9 kg (288 lb 9.3 oz) IBW/kg (Calculated) : 66.1 Heparin Dosing Weight: 96.8 kg  Vital Signs: Temp: 99.5 F (37.5 C) (06/03 0300) Temp Source: Oral (06/03 0300) BP: 104/54 (06/03 0430) Pulse Rate: 73 (06/03 0430)  Labs: Recent Labs    05/11/21 0454 05/11/21 1325 05/12/21 0509 05/12/21 1504 05/13/21 0412  HGB 14.2  --  14.0  --  14.7  HCT 44.6  --  44.9  --  46.2  PLT 167  --  166  --  111*  APTT 139* 124*  --  45* 77*  HEPARINUNFRC 1.04*  --   --  0.44 0.76*  CREATININE 1.92*  --  1.71*  --  1.68*    Estimated Creatinine Clearance: 48.7 mL/min (A) (by C-G formula based on SCr of 1.68 mg/dL (H)).   Medical History: Past Medical History:  Diagnosis Date  . AAA (abdominal aortic aneurysm) ([place])    followed by Dr. Bridgett Larsson  . Adenomatous colon polyp 01/2004  . Anemia    hx  . Automatic implantable cardioverter-defibrillator in situ   . AVM (arteriovenous malformation)   . BPH (benign prostatic hypertrophy)   . CAD (coronary artery disease)    s/p CABGx2 in 1996  . Carotid artery stenosis    LCEA - Dr. Bridgett Larsson in 2013  . CHF (congestive heart failure) ([place])   . Complication of anesthesia    claustrophobic, unabe to lie on back more than 4 hours at time due to back  . COPD (chronic obstructive pulmonary disease) ([place])   . Diabetes mellitus    DIET CONTROLLED- pt states that this was a misdiagnosis, he was treated while in the hospital  but returned home, loss a massive amount of weight and he has not had a problem with his blood sugar since. States everything has been normal for about 3 years.  . Diverticulosis   . Dyspnea   .  Dysrhythmia    ICD-defibrillator  . Fatigue   . GERD (gastroesophageal reflux disease)   . H/O hiatal hernia   . History of radiation therapy 04/09/17-04/17/17   SBRT right lung 54 Gy in 3 fractions  . HLD (hyperlipidemia)   . Hypertension   . Hypothyroidism   . Ischemic cardiomyopathy   . Myocardial infarction ([place])   . 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 OSA on CPAP    AHI durign total sleep 14.69/hr, during REM 50.91/hr  . Peripheral vascular disease (HCC)    LCEA, L renal artery stent, bilat iliac stents, R SFA stenosis  . Tremor   . Ventricular tachycardia ([place]) 09/09/2014   Amiodarone was started after appropriate defibrillator shocks for ventricular tachycardia in October 2008    Assessment: 75 yo man to start heparin for afib while xarelto on hold with VDRF and AKI.  Last dose xarelto 5/28.    He had a chest tube placed 6/1 and heparin was restarted this morning at 8am -heparin level = 0.45  -aPTT= 45 -heparin rate was 1700-1850 units/hr prior to chest tube placement  6/3 AM update:  APTT within goal range  Goal of Therapy:  APTT 66-102 sec Heparin level 0.3-0.7 units/ml Monitor platelets  by anticoagulation protocol: Yes   Plan:  Cont heparin 1750 units/hr Confirmatory aPTT/heparin level at [place], PharmD, [place] Pharmacist Phone: [phone]

## 2021-05-13 NOTE — Progress Notes (Signed)
NAME:  [name], MRN:  [id number], DOB:  Aug 24, 1946, LOS: 4 ADMISSION DATE:  04/16/2021, CONSULTATION DATE:  05/09/2021 REFERRING MD:  Dr. Randal Buba, CHIEF COMPLAINT:  SOB  History of Present Illness:  [name] is a 75 yo M with past medical hx as below presenting from home with complaints of SOB/ respiratory distress on 5/29.  Treated by EMS with duonebs, epi, solumedrol, and mag without improvement, requiring BVM.  On arrival to ER, patient required intubation as he was peri- arrest, unresponsive, bradycardic, temp 95.9, hypotensive, and apneic with diffuse apical wheeze requiring emergent intubation on arrival.  Noted to have pink frothy secretions on intubation.   PCCM called for admit.   He remains critically ill in the [facility] ICU  Pertinent  Medical History  Morbid obesity, HFrEF (12/24/20 EF 40-45%, G2DD, PAF, mildly reduced systolic RV, mild MR), VT on amio/ ICD, CAD s/p CABG 1996, LBBB, HLD, COPD, OSA on CPAP, AAA (s/p repair 2015), NSCL lung CA s/p XRT 2018, anemia, BPH, DMT2, GERD, hiatal hernia, PVD, tremor  Significant Hospital Events: Including procedures, antibiotic start and stop dates in addition to other pertinent events   . 5/30 admitted for respiratory failure/ intubated, empiric CAP coverage/ duresis . 6/1 BLLE Duplex> no DVT, unable to complete thoracentesis due to anatomy  . 6/2 Tmax 101, -2.9L in last 24 hours, on levo at 55mcg, precedex. Resp culture negative. BC pending.  Interim History / Subjective:  Tmax 100.3 On vent - weaned PSV 10/5 for ~ 2.5 hours  Blood cultures pending, on ceftriaxone  Glucose range 159-233 I/O 6L UOP, -3.4L in last 24 hours   Objective   Blood pressure (!) 105/53, pulse 78, temperature 100.3 F (37.9 C), temperature source Oral, resp. rate (!) 24, height 5\' 7"  (1.702 m), weight 125.9 kg, SpO2 95 %. CVP:  [6 mmHg] 6 mmHg  Vent Mode: PSV;CPAP FiO2 (%):  [35 %-40 %] 35 % Set Rate:  [24 bmp] 24 bmp Vt Set:  [530 mL] 530 mL PEEP:  [5  cmH20] 5 cmH20 Pressure Support:  [10 cmH20] 10 cmH20 Plateau Pressure:  [13 cmH20-21 cmH20] 13 cmH20   Intake/Output Summary (Last 24 hours) at 05/13/2021 1029 Last data filed at 05/13/2021 0800 Gross per 24 hour  Intake 2444.25 ml  Output 6220 ml  Net -3775.75 ml   Filed Weights   05/11/21 0527 05/12/21 0500 05/13/21 0600  Weight: 132.5 kg 130.9 kg 125.9 kg    Examination: General: obese, ill appearing adult male lying in bed on vent in NAD HEENT: MM pink/moist, ETT Neuro: Awakens to voice, nods yes/no to questions, spontaneous movements noted  CV: s1s2 RRR, no m/r/g PULM: non-labored on PSV, pulling adequate volumes, occasional wheeze anterior GI: soft, protuberant, bsx4 active  Extremities: warm/dry, BLE pitting edema  Skin: no rashes or lesions   Labs/imaging that I havepersonally reviewed  (right click and "Reselect all SmartList Selections" daily)  CBC -- platelets 111 (down from 166) BMP -- Na 149, K 4.9, BUN 69, Sr Cr 1.68, glucose 247  Mg/Phos  Micro   Resolved Hospital Problem list    Assessment & Plan:   Shock- suspected septic with pneumonia as source, complicated by poor cardiac function Acute hypoxic/ hypercarbic respiratory failure with sL>R multifocal infiltrates c/w CAP Hx of lung cancer with 3 recurrences with baseline abnormal CXR COPD questionable flare Failed SBT 5/31, 6/1, 6/2 due to WOB and agitation. Not enough fluid on 6/1 for thoracentesis. -continue lasix 60 mg BID x2  doses -continue ceftriaxone, stop date in place -follow intermittent CXR  -PRVC 8cc/kg as rest mode  -daily SBT / WUA with goal for extubation, hopeful with fluid removal he will wean  -VAP prevention measures  -PAD protocol, RASS goal 0 to -1  HFrEF (12/24/20 EF 40-45%), G2DD, mildly reduced systolic RV, mild MR)  Hx of VT on amio w/ICD Hx CAD s/p CABG 1996  Hx LBBB HTN PAF -lasix as above -levophed if needed for MAP >65 -continue heparin gtt -continue amodarone PT   -lipitor PT -hold home entresto, spironolactone, torsemide, imdur, carvedilol  -tele monitoring   AKI on CKD 3b BPH Hypernatremia  Phimosis - required bedside dilation by urology for foley placement 5/30 Creatinine 2.21>1.84>1.92>1.71  -lasix as above -add free water  -Trend BMP / urinary output -Replace electrolytes as indicated -Avoid nephrotoxic agents, ensure adequate renal perfusion -continue bethanechol  Leukocytosis with Fevers Tmax 101, WBC 21.8>17.7>17.1>17.9, suspect secondary to steroids, LE Doppler neg for dvt  -monitor off steroids, stopped 6/3  -follow fever curve / WBC trend   Hypothyroidism -synthroid PT   Hyperglycemia -increase TF coverage to 4 units Q4  -continue lantus 30 units BID  -SSI, moderate scale   Class 3 obesity -diet education when able   Baseline anxiety d/o -klonopin BID   Best practice (right click and "Reselect all SmartList Selections" daily)  Diet:  TF Pain/Anxiety/Delirium protocol (if indicated): Yes (RASS goal 0) VAP protocol (if indicated): Yes DVT prophylaxis: Systemic AC GI prophylaxis: PPI Glucose control:  SSI/lantus Central venous access:  Yes, and it is still needed Arterial line:  no Foley:  Yes, and it is still needed Mobility:  bed rest  PT consulted: [name] Last date of multidisciplinary goals of care discussion:  Code Status:  full code Disposition: ICU   Critical Care Time: 22 minutes   Noe Gens, MSN, APRN, NP-C, AGACNP-BC [facility] 05/13/2021, 10:29 AM   Please see [email or web address] for pager details.   From 7A-7P if no response, please call [phone] After hours, please call ELink [phone]

## 2021-05-13 NOTE — Progress Notes (Signed)
[place] for heparin Indication: atrial fibrillation  Allergies  Allergen Reactions  . Tussionex Pennkinetic Er [Hydrocod Polst-Cpm Polst Er] Other (See Comments)    UNSPECIFIED REACTION  > "caused prostate problems"  . Ace Inhibitors Cough    Patient Measurements: Height: 5\' 7"  (170.2 cm) Weight: 125.9 kg (277 lb 9 oz) IBW/kg (Calculated) : 66.1 Heparin Dosing Weight: 96.8 kg  Vital Signs: Temp: 98.4 F (36.9 C) (06/03 1125) Temp Source: Oral (06/03 1125) BP: 96/57 (06/03 1100) Pulse Rate: 84 (06/03 1100)  Labs: Recent Labs    05/11/21 0454 05/11/21 1325 05/12/21 0509 05/12/21 1504 05/13/21 0412  HGB 14.2  --  14.0  --  14.7  HCT 44.6  --  44.9  --  46.2  PLT 167  --  166  --  111*  APTT 139* 124*  --  45* 77*  HEPARINUNFRC 1.04*  --   --  0.44 0.76*  CREATININE 1.92*  --  1.71*  --  1.68*    Estimated Creatinine Clearance: 47.6 mL/min (A) (by C-G formula based on SCr of 1.68 mg/dL (H)).   Medical History: Past Medical History:  Diagnosis Date  . AAA (abdominal aortic aneurysm) ([place])    followed by Dr. Bridgett Larsson  . Adenomatous colon polyp 01/2004  . Anemia    hx  . Automatic implantable cardioverter-defibrillator in situ   . AVM (arteriovenous malformation)   . BPH (benign prostatic hypertrophy)   . CAD (coronary artery disease)    s/p CABGx2 in 1996  . Carotid artery stenosis    LCEA - Dr. Bridgett Larsson in 2013  . CHF (congestive heart failure) ([place])   . Complication of anesthesia    claustrophobic, unabe to lie on back more than 4 hours at time due to back  . COPD (chronic obstructive pulmonary disease) ([place])   . Diabetes mellitus    DIET CONTROLLED- pt states that this was a misdiagnosis, he was treated while in the hospital  but returned home, loss a massive amount of weight and he has not had a problem with his blood sugar since. States everything has been normal for about 3 years.  . Diverticulosis   . Dyspnea   .  Dysrhythmia    ICD-defibrillator  . Fatigue   . GERD (gastroesophageal reflux disease)   . H/O hiatal hernia   . History of radiation therapy 04/09/17-04/17/17   SBRT right lung 54 Gy in 3 fractions  . HLD (hyperlipidemia)   . Hypertension   . Hypothyroidism   . Ischemic cardiomyopathy   . Myocardial infarction ([place])   . 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 OSA on CPAP    AHI durign total sleep 14.69/hr, during REM 50.91/hr  . Peripheral vascular disease ([facility])    LCEA, L renal artery stent, bilat iliac stents, R SFA stenosis  . Tremor   . Ventricular tachycardia ([place]) 09/09/2014   Amiodarone was started after appropriate defibrillator shocks for ventricular tachycardia in October 2008    Assessment: 75 yo man to start heparin for afib while xarelto on hold with VDRF and possible pneumonia and AKI.  Last dose xarelto 5/28.    Heparin drip 1750 uts/hr heparin level elevated from rivaroxaban, aptt 77sec at goal.  CBC stable no bleeding noted   Goal of Therapy:  APTT 66-102 sec Heparin level 0.3-0.7 units/ml Monitor platelets by anticoagulation protocol: Yes   Plan:  Continue heparin  1750 units/hr -aPTT, heparin level and CBC  in am   Bonnita Nasuti Pharm.D. CPP, BCPS Clinical Pharmacist [phone] 05/13/2021 12:35 PM    **Pharmacist phone directory can now be found on [email or web address] ([email or web address]).  Listed under [place].

## 2021-05-13 NOTE — Progress Notes (Signed)
Inpatient Diabetes Program Recommendations  AACE/ADA: New Consensus Statement on Inpatient Glycemic Control (2015)  Target Ranges:  Prepandial:   less than 140 mg/dL      Peak postprandial:   less than 180 mg/dL (1-2 hours)      Critically ill patients:  140 - 180 mg/dL   Lab Results  Component Value Date   GLUCAP 233 (H) 05/13/2021   HGBA1C 5.9 (H) 05/09/2021    Review of Glycemic Control Results for [name], [name] (MRN [id number]) as of 05/13/2021 10:52  Ref. Range 05/12/2021 16:22 05/12/2021 20:16 05/13/2021 00:47 05/13/2021 04:15 05/13/2021 08:02  Glucose-Capillary Latest Ref Range: 70 - 99 mg/dL 302 (H) 291 (H) 288 (H) 208 (H) 233 (H)   Diabetes history: DM 2 Outpatient Diabetes medications:  Farxiga 10 mg daily Current orders for Inpatient glycemic control:  Novolog moderate q 4 hours Vital 45 cc/ hr Lantus 30 units bid  Novolog 3 units q 4 hours Inpatient Diabetes Program Recommendations:   Consider increasing Novolog tube feed coverage to 6 units q 4 hours.    Thanks,  Adah Perl, RN, BC-ADM Inpatient Diabetes Coordinator Pager [phone] (8a-5p)

## 2021-05-13 NOTE — Plan of Care (Signed)
  Problem: Clinical Measurements: Goal: Ability to maintain clinical measurements within normal limits will improve Outcome: Progressing Goal: Will remain free from infection Outcome: Progressing Goal: Diagnostic test results will improve Outcome: Progressing Goal: Respiratory complications will improve Outcome: Progressing Goal: Cardiovascular complication will be avoided Outcome: Progressing   Problem: Nutrition: Goal: Adequate nutrition will be maintained Outcome: Progressing   Problem: Elimination: Goal: Will not experience complications related to bowel motility Outcome: Progressing Goal: Will not experience complications related to urinary retention Outcome: Progressing   Problem: Pain Managment: Goal: General experience of comfort will improve Outcome: Progressing   Problem: Safety: Goal: Ability to remain free from injury will improve Outcome: Progressing   Problem: Skin Integrity: Goal: Risk for impaired skin integrity will decrease Outcome: Progressing   Problem: Respiratory: Goal: Ability to maintain a clear airway and adequate ventilation will improve Outcome: Progressing

## 2021-05-13 NOTE — Progress Notes (Signed)
Pharmacy Heparin Induced Thrombocytopenia (HIT) Note:  [name] is an 75 y.o. male being evaluated for HIT. Heparin was started 05/09/21 for AFib, and baseline platelets were 253.   HIT labs were ordered on 05/13/21 when platelets dropped to 111.  Auto-populate labs: No results found for: HEPINDPLTAB, SRALOWDOSEHP, SRAHIGHDOSEH   CALCULATE SCORE:  4Ts (see the HIT Algorithm) Score  Thrombocytopenia 2  Timing 2  Thrombosis 0  Other causes of thrombocytopenia 1  Total 5     Recommendations (A or B) are based on available lab results (HIT antibody and/or SRA) and the HIT algorithm    A. HIT antibody result available pending   B. SRA result availability  SRA not available   Name of MD Contacted: Clark (CCM)  Plan (Discussed with provider) Labs ordered:  SRA not needed  Heparin allergy:  Heparin allergy documented or updated. Anticoagulation plans:  Discontinue heparin / LMWH, begin AAC   Comments (List any alternative plans or if there are contraindications to therapy)   [name], PharmD, BCPS, [facility] Clinical Pharmacist [phone] Please check AMION for all [place] numbers 05/13/2021

## 2021-05-14 ENCOUNTER — Inpatient Hospital Stay (HOSPITAL_COMMUNITY): Payer: Medicare Other

## 2021-05-14 DIAGNOSIS — Z9911 Dependence on respirator [ventilator] status: Secondary | ICD-10-CM | POA: Diagnosis not present

## 2021-05-14 DIAGNOSIS — J9601 Acute respiratory failure with hypoxia: Secondary | ICD-10-CM | POA: Diagnosis not present

## 2021-05-14 LAB — CBC
HCT: 48 % (ref 39.0–52.0)
Hemoglobin: 14.9 g/dL (ref 13.0–17.0)
MCH: 31 pg (ref 26.0–34.0)
MCHC: 31 g/dL (ref 30.0–36.0)
MCV: 100 fL (ref 80.0–100.0)
Platelets: 137 10*3/uL — ABNORMAL LOW (ref 150–400)
RBC: 4.8 MIL/uL (ref 4.22–5.81)
RDW: 13.6 % (ref 11.5–15.5)
WBC: 15 10*3/uL — ABNORMAL HIGH (ref 4.0–10.5)
nRBC: 0.3 % — ABNORMAL HIGH (ref 0.0–0.2)

## 2021-05-14 LAB — APTT
aPTT: 36 seconds (ref 24–36)
aPTT: 45 seconds — ABNORMAL HIGH (ref 24–36)
aPTT: 47 seconds — ABNORMAL HIGH (ref 24–36)

## 2021-05-14 LAB — BASIC METABOLIC PANEL
Anion gap: 12 (ref 5–15)
BUN: 73 mg/dL — ABNORMAL HIGH (ref 8–23)
CO2: 30 mmol/L (ref 22–32)
Calcium: 9 mg/dL (ref 8.9–10.3)
Chloride: 109 mmol/L (ref 98–111)
Creatinine, Ser: 1.82 mg/dL — ABNORMAL HIGH (ref 0.61–1.24)
GFR, Estimated: 38 mL/min — ABNORMAL LOW (ref >60)
Glucose, Bld: 198 mg/dL — ABNORMAL HIGH (ref 70–99)
Potassium: 4.5 mmol/L (ref 3.5–5.1)
Sodium: 151 mmol/L — ABNORMAL HIGH (ref 135–145)

## 2021-05-14 LAB — COOXEMETRY PANEL
Carboxyhemoglobin: 1.1 % (ref 0.5–1.5)
Methemoglobin: 1 % (ref 0.0–1.5)
O2 Saturation: 72 %
Total hemoglobin: 15.4 g/dL (ref 12.0–16.0)

## 2021-05-14 LAB — GLUCOSE, CAPILLARY
Glucose-Capillary: 165 mg/dL — ABNORMAL HIGH (ref 70–99)
Glucose-Capillary: 181 mg/dL — ABNORMAL HIGH (ref 70–99)
Glucose-Capillary: 194 mg/dL — ABNORMAL HIGH (ref 70–99)
Glucose-Capillary: 201 mg/dL — ABNORMAL HIGH (ref 70–99)
Glucose-Capillary: 207 mg/dL — ABNORMAL HIGH (ref 70–99)
Glucose-Capillary: 207 mg/dL — ABNORMAL HIGH (ref 70–99)

## 2021-05-14 LAB — CULTURE, BLOOD (ROUTINE X 2)
Culture: NO GROWTH
Culture: NO GROWTH

## 2021-05-14 LAB — MAGNESIUM: Magnesium: 2.9 mg/dL — ABNORMAL HIGH (ref 1.7–2.4)

## 2021-05-14 LAB — HEPARIN INDUCED PLATELET AB (HIT ANTIBODY): Heparin Induced Plt Ab: 0.099 OD (ref 0.000–0.400)

## 2021-05-14 LAB — PHOSPHORUS: Phosphorus: 3.5 mg/dL (ref 2.5–4.6)

## 2021-05-14 LAB — BRAIN NATRIURETIC PEPTIDE: B Natriuretic Peptide: 216.4 pg/mL — ABNORMAL HIGH (ref 0.0–100.0)

## 2021-05-14 MED ORDER — INSULIN ASPART 100 UNIT/ML IJ SOLN
5.0000 [IU] | INTRAMUSCULAR | Status: DC
  Administered 2021-05-14 – 2021-05-15 (×6): 5 [IU] via SUBCUTANEOUS

## 2021-05-14 MED ORDER — HEPARIN (PORCINE) 25000 UT/250ML-% IV SOLN
2050.0000 [IU]/h | INTRAVENOUS | Status: DC
  Administered 2021-05-14 – 2021-05-19 (×7): 1300 [IU]/h via INTRAVENOUS
  Administered 2021-05-20 – 2021-05-21 (×2): 1250 [IU]/h via INTRAVENOUS
  Administered 2021-05-22: 1400 [IU]/h via INTRAVENOUS
  Administered 2021-05-23: 1500 [IU]/h via INTRAVENOUS
  Administered 2021-05-24 – 2021-05-25 (×3): 1600 [IU]/h via INTRAVENOUS
  Administered 2021-05-26: 1800 [IU]/h via INTRAVENOUS
  Administered 2021-05-26: 1600 [IU]/h via INTRAVENOUS
  Administered 2021-05-27 – 2021-05-28 (×2): 1950 [IU]/h via INTRAVENOUS
  Administered 2021-05-28 – 2021-06-01 (×7): 2050 [IU]/h via INTRAVENOUS
  Filled 2021-05-14 (×25): qty 250

## 2021-05-14 MED ORDER — FREE WATER
300.0000 mL | Status: DC
  Administered 2021-05-14 – 2021-05-22 (×49): 300 mL

## 2021-05-14 MED ORDER — FUROSEMIDE 10 MG/ML IJ SOLN
60.0000 mg | Freq: Two times a day (BID) | INTRAMUSCULAR | Status: AC
  Administered 2021-05-14 (×2): 60 mg via INTRAVENOUS
  Filled 2021-05-14 (×2): qty 6

## 2021-05-14 NOTE — Progress Notes (Signed)
[place] for argatroban> heparin  Indication: afib  Allergies  Allergen Reactions  . Heparin Other (See Comments)    HIT antibody pending 05/13/21  . Tussionex Pennkinetic Er [Hydrocod Polst-Cpm Polst Er] Other (See Comments)    UNSPECIFIED REACTION  > "caused prostate problems"  . Ace Inhibitors Cough    Patient Measurements: Height: 5\' 7"  (170.2 cm) Weight: 121.7 kg (268 lb 4.8 oz) IBW/kg (Calculated) : 66.1 Heparin Dosing Weight: 125kg  Vital Signs: Temp: 99.4 F (37.4 C) (06/04 1613) Temp Source: Oral (06/04 1613) BP: 124/65 (06/04 1600) Pulse Rate: 100 (06/04 1600)  Labs: Recent Labs    05/12/21 0509 05/12/21 0509 05/12/21 1504 05/13/21 0412 05/13/21 2257 05/14/21 0500 05/14/21 0816 05/14/21 1538  HGB 14.0  --   --  14.7  --  14.9  --   --   HCT 44.9  --   --  46.2  --  48.0  --   --   PLT 166  --   --  111*  --  137*  --   --   APTT  --    < > 45* 77* 36  --  45* 47*  HEPARINUNFRC  --   --  0.44 0.76*  --   --   --   --   CREATININE 1.71*  --   --  1.68*  --  1.82*  --   --    < > = values in this interval not displayed.    Estimated Creatinine Clearance: 43.1 mL/min (A) (by C-G formula based on SCr of 1.82 mg/dL (H)).  Assessment: 75 yo male with h/o Afib, Xarelto on hold, new thrombocytopenia on heparin and concern for HIT and on argatroban -heparin antibody negative  Discussed with Dr. Carlis Abbott- change back to heparin    Goal of Therapy:  aPTT 50-90 seconds Monitor platelets by anticoagulation protocol: Yes   Plan:  -start heparin at 1300 units/hr -check morning heparin level with CBC  Hildred Laser, PharmD Clinical Pharmacist **Pharmacist phone directory can now be found on [email or web address] ([email or web address]).  Listed under [place].

## 2021-05-14 NOTE — Progress Notes (Signed)
[place] for argatroban Indication: HIT  Allergies  Allergen Reactions  . Heparin Other (See Comments)    HIT antibody pending 05/13/21  . Tussionex Pennkinetic Er [Hydrocod Polst-Cpm Polst Er] Other (See Comments)    UNSPECIFIED REACTION  > "caused prostate problems"  . Ace Inhibitors Cough    Patient Measurements: Height: 5\' 7"  (170.2 cm) Weight: 121.7 kg (268 lb 4.8 oz) IBW/kg (Calculated) : 66.1 Heparin Dosing Weight: 125kg  Vital Signs: Temp: 99.9 F (37.7 C) (06/04 0804) Temp Source: Oral (06/04 0804) BP: 142/72 (06/04 0800) Pulse Rate: 106 (06/04 0800)  Labs: Recent Labs    05/12/21 0509 05/12/21 1504 05/13/21 0412 05/13/21 2257 05/14/21 0500 05/14/21 0816  HGB 14.0  --  14.7  --  14.9  --   HCT 44.9  --  46.2  --  48.0  --   PLT 166  --  111*  --  137*  --   APTT  --  45* 77* 36  --  45*  HEPARINUNFRC  --  0.44 0.76*  --   --   --   CREATININE 1.71*  --  1.68*  --  1.82*  --     Estimated Creatinine Clearance: 43.1 mL/min (A) (by C-G formula based on SCr of 1.82 mg/dL (H)).  Assessment: 75 yo male with h/o Afib, Xarelto on hold, new thrombocytopenia on heparin and concern for HIT, for argatroban. 6/3 HIT Ab pending.  APTT (45 sec) increased but remains subtherapeutic, on drip rate 0.75 mcg/kg/min. Hgb wnl stable, pltc 137 up from 111. No overt bleeding or infusion issues per discussion with nursing.   Goal of Therapy:  aPTT 50-90 seconds Monitor platelets by anticoagulation protocol: Yes   Plan:  Increase argatroban to 0.9 mcg/kg/min APTT in 4 hours Monitor for s/sx bleeding  Richardine Service, PharmD, BCPS PGY2 Cardiology Pharmacy Resident Phone: [phone] 05/14/2021  9:56 AM  Please check [email or web address] for unit-specific pharmacy phone numbers.

## 2021-05-14 NOTE — Progress Notes (Signed)
NAME:  MATTHAN SLEDGE, MRN:  [id number], DOB:  10-17-1946, LOS: 5 ADMISSION DATE:  04/13/2021, CONSULTATION DATE:  05/09/2021 REFERRING MD:  Dr. Randal Buba, CHIEF COMPLAINT:  SOB  History of Present Illness:  Mr. Stroder is a 75 yo M with past medical hx as below presenting from home with complaints of SOB/ respiratory distress on 5/29.  Treated by EMS with duonebs, epi, solumedrol, and mag without improvement, requiring BVM.  On arrival to ER, patient required intubation as he was peri- arrest, unresponsive, bradycardic, temp 95.9, hypotensive, and apneic with diffuse apical wheeze requiring emergent intubation on arrival.  Noted to have pink frothy secretions on intubation.  PCCM called for admit.   Pertinent  Medical History  Morbid obesity, HFrEF (12/24/20 EF 40-45%, G2DD, PAF, mildly reduced systolic RV, mild MR), VT on amio/ ICD, CAD s/p CABG 1996, LBBB, HLD, COPD, OSA on CPAP, AAA (s/p repair 2015), NSCL lung CA s/p XRT 2018, anemia, BPH, DMT2, GERD, hiatal hernia, PVD, tremor  Significant Hospital Events: Including procedures, antibiotic start and stop dates in addition to other pertinent events   . 5/30 admitted for respiratory failure/ intubated, empiric CAP coverage/ duresis . 6/1 BLLE Duplex> no DVT, unable to complete thoracentesis due to anatomy  . 6/2 Tmax 101, -2.9L in last 24 hours, on levo at 57mcg, precedex. Resp culture negative. BC pending. . 6/3 switched from heparin to argatroban for thrombocytopenia, HIT ab pending  Interim History / Subjective:  Afebrile. He denies complaints. Failed SBT this morning, was complaining of SOB.  Objective   Blood pressure 125/62, pulse 100, temperature 99.9 F (37.7 C), temperature source Oral, resp. rate (!) 24, height 5\' 7"  (1.702 m), weight 121.7 kg, SpO2 97 %.    Vent Mode: CPAP;PSV FiO2 (%):  [35 %] 35 % Set Rate:  [24 bmp] 24 bmp Vt Set:  [530 mL] 530 mL PEEP:  [5 cmH20] 5 cmH20 Pressure Support:  [10 cmH20-12 cmH20] 12  cmH20 Plateau Pressure:  [18 cmH20-19 cmH20] 18 cmH20   Intake/Output Summary (Last 24 hours) at 05/14/2021 0831 Last data filed at 05/14/2021 0730 Gross per 24 hour  Intake 715.65 ml  Output 3700 ml  Net -2984.35 ml   Filed Weights   05/12/21 0500 05/13/21 0600 05/14/21 0326  Weight: 130.9 kg 125.9 kg 121.7 kg    Examination: General: critically ill appearing man lying in bed in NAD, intubated, not sedated HEENT: [place]/AT, eyes anicteric, ETT in place Neuro: RASS -1, following commands in all extremities.  Nods yes and no to questions. CV: S1S2, regular rate and irreg rhythm PULM: Tachypneic, breathing comfortably on PRVC.  No rhonchi. GI: Obese, soft, nontender, nondistended Extremities: Persistent lower extremity edema, slowly improving. Skin: no rashes or lesions   Labs/imaging that I havepersonally reviewed  (right click and "Reselect all SmartList Selections" daily)  BNP 281> 216 Na+ 151 BUN 73 Cr 1.82 WBC 15 Platelets 137, improving Echo 6/2> LVEF 20-25%, severely decreased function, global hypokinesis. Mild concentric LVH. Normal RV size and function. Mild MR. Mild to moderate aortic sclerosis. IVC with reduced respiratory variability.   Resolved Hospital Problem list    Assessment & Plan:   Shock- suspected septic with pneumonia as source, complicated by poor cardiac function -con't antibiotics  -Continue vasopressors as required to maintain MAP >65 -BNP to guide diuresis -checking coox> 72% off pressors  Acute hypoxic/ hypercarbic respiratory failure with sL>R multifocal infiltrates c/w CAP Hx of lung cancer with 3 recurrences with baseline abnormal CXR COPD  with questionable acute exacerbation Failed SBT 5/31, 6/1, 6/2 due to WOB and agitation.  -continue lasix 60 mg BID x2 doses again today -continue ceftriaxone to complete 7 days -LTVV, 4-8cc/kg IBW with goal Pplat<30 and DP<15 -VAP prevention protocol -PAD protocol for sedation -daily SAT & SBT as  appropriate> failed today due to tachypnea and work of breathing; would extubate to bipap given underlying OSA and risk for OHS   HFrEF (12/24/20 EF 40-45%, G2DD, mildly reduced systolic RV, mild MR) >> LVEF down to 20-25% this admission Hx of VT on amio w/ICD Hx CAD s/p CABG 1996  Hx LBBB HTN PAF -diuresis -levophed as needed for MAP >65 -con't argatroban -enteral amiodarone -atorvastatin -hold home entresto, spironolactone, torsemide, imdur, carvedilol given shock -tele monitoring  -maintain appropriate electrolytes  AKI on CKD 3b BPHon PTA finasteride Hypernatremia, worsened. Phimosis - required bedside dilation by urology for foley placement 5/30 -needs ongoing diuresis -increasing free water  -strict I/OS -renally dose meds, avoid nephrotoxic meds -con't to monitor renal function -continue bethanechol -foley to remain in place -per urology may need OP follow up for surgery to address phimosis; unfortunately with ongoing AKI and phimosis still needs foley  Leukocytosis- persistent. Fevers resolved. -con't to monitor -completing course of antibiotics  Hypothyroidism -con't synthroid  Hyperglycemia, uncontrolled -increase TF coverage to 5 units Q4  -continue lantus 30 units BID  -SSI, moderate scale  -goal BG 140-180  Class 3 obesity education when able   Baseline anxiety disorder -klonopin BID   Thrombocytopenia; 4T score= 4, intermediate risk for HIT, although B lactams can also decrease platelets -HIT Ab pending -con't argatroban  No family present at bedside during rounds today.  Best practice (right click and "Reselect all SmartList Selections" daily)  Diet:  TF Pain/Anxiety/Delirium protocol (if indicated): Yes (RASS goal 0) VAP protocol (if indicated): Yes DVT prophylaxis: Systemic AC GI prophylaxis: PPI Glucose control:  Basal bolus, SSI Central venous access:  Yes, and it is still needed Arterial line:  no Foley:  Yes, and it is still  needed Mobility:  bed rest  PT consulted: [name] Last date of multidisciplinary goals of care discussion:  Code Status:  full code Disposition: ICU  This patient is critically ill with multiple organ system failure which requires frequent high complexity decision making, assessment, support, evaluation, and titration of therapies. This was completed through the application of advanced monitoring technologies and extensive interpretation of multiple databases. During this encounter critical care time was devoted to patient care services described in this note for 34 minutes.  Julian Hy, DO 05/14/21 12:37 PM [facility]  For contact information, see Amion. If no response to pager, please call PCCM consult pager. After hours, 7PM- 7AM, please call [place].

## 2021-05-14 NOTE — Progress Notes (Signed)
PT Cancellation Note  Patient Details Name: CHANDLER SWIDERSKI MRN: [id number] DOB: 05/16/1946   Cancelled Treatment:    Reason Eval/Treat Not Completed: Patient not medically ready (Pt currently intubated and sedated and not currently appropriate)   Alynn Ellithorpe B Lindwood Mogel 05/14/2021, 11:23 AM  Bayard Males, PT Acute Rehabilitation Services Pager: [phone] Office: [phone]

## 2021-05-14 NOTE — Progress Notes (Signed)
[place] for argatroban Indication: HIT  Allergies  Allergen Reactions  . Heparin Other (See Comments)    HIT antibody pending 05/13/21  . Tussionex Pennkinetic Er [Hydrocod Polst-Cpm Polst Er] Other (See Comments)    UNSPECIFIED REACTION  > "caused prostate problems"  . Ace Inhibitors Cough    Patient Measurements: Height: 5\' 7"  (170.2 cm) Weight: 125.9 kg (277 lb 9 oz) IBW/kg (Calculated) : 66.1 Heparin Dosing Weight: 125kg  Vital Signs: Temp: 99.4 F (37.4 C) (06/04 0000) Temp Source: Oral (06/04 0000) BP: 113/65 (06/03 2300) Pulse Rate: 91 (06/03 2300)  Labs: Recent Labs    05/11/21 0454 05/11/21 1325 05/12/21 0509 05/12/21 1504 05/13/21 0412 05/13/21 2257  HGB 14.2  --  14.0  --  14.7  --   HCT 44.6  --  44.9  --  46.2  --   PLT 167  --  166  --  111*  --   APTT 139*   < >  --  45* 77* 36  HEPARINUNFRC 1.04*  --   --  0.44 0.76*  --   CREATININE 1.92*  --  1.71*  --  1.68*  --    < > = values in this interval not displayed.    Estimated Creatinine Clearance: 47.6 mL/min (A) (by C-G formula based on SCr of 1.68 mg/dL (H)).  Assessment: 75 yo male with h/o Afib, Xarelto on hold, new thrombocytopenia on heparin and concern for HIT, for argatroban   Goal of Therapy:  aPTT 50-90 seconds Monitor platelets by anticoagulation protocol: Yes   Plan:  Increase argatroban 0.75 mcg/kg/min APTT in 4 hours  Phillis Knack, PharmD, BCPS  05/14/2021

## 2021-05-15 DIAGNOSIS — Z9911 Dependence on respirator [ventilator] status: Secondary | ICD-10-CM | POA: Diagnosis not present

## 2021-05-15 DIAGNOSIS — J9601 Acute respiratory failure with hypoxia: Secondary | ICD-10-CM | POA: Diagnosis not present

## 2021-05-15 LAB — GLUCOSE, CAPILLARY
Glucose-Capillary: 153 mg/dL — ABNORMAL HIGH (ref 70–99)
Glucose-Capillary: 188 mg/dL — ABNORMAL HIGH (ref 70–99)
Glucose-Capillary: 189 mg/dL — ABNORMAL HIGH (ref 70–99)
Glucose-Capillary: 195 mg/dL — ABNORMAL HIGH (ref 70–99)
Glucose-Capillary: 209 mg/dL — ABNORMAL HIGH (ref 70–99)
Glucose-Capillary: 215 mg/dL — ABNORMAL HIGH (ref 70–99)

## 2021-05-15 LAB — BASIC METABOLIC PANEL
Anion gap: 11 (ref 5–15)
BUN: 83 mg/dL — ABNORMAL HIGH (ref 8–23)
CO2: 28 mmol/L (ref 22–32)
Calcium: 8.8 mg/dL — ABNORMAL LOW (ref 8.9–10.3)
Chloride: 109 mmol/L (ref 98–111)
Creatinine, Ser: 1.89 mg/dL — ABNORMAL HIGH (ref 0.61–1.24)
GFR, Estimated: 36 mL/min — ABNORMAL LOW (ref >60)
Glucose, Bld: 208 mg/dL — ABNORMAL HIGH (ref 70–99)
Potassium: 4.1 mmol/L (ref 3.5–5.1)
Sodium: 148 mmol/L — ABNORMAL HIGH (ref 135–145)

## 2021-05-15 LAB — CBC
HCT: 47.3 % (ref 39.0–52.0)
Hemoglobin: 14.5 g/dL (ref 13.0–17.0)
MCH: 30.9 pg (ref 26.0–34.0)
MCHC: 30.7 g/dL (ref 30.0–36.0)
MCV: 100.6 fL — ABNORMAL HIGH (ref 80.0–100.0)
Platelets: 107 10*3/uL — ABNORMAL LOW (ref 150–400)
RBC: 4.7 MIL/uL (ref 4.22–5.81)
RDW: 13.3 % (ref 11.5–15.5)
WBC: 17.5 10*3/uL — ABNORMAL HIGH (ref 4.0–10.5)
nRBC: 0.2 % (ref 0.0–0.2)

## 2021-05-15 LAB — PHOSPHORUS: Phosphorus: 4.6 mg/dL (ref 2.5–4.6)

## 2021-05-15 LAB — HEPARIN LEVEL (UNFRACTIONATED)
Heparin Unfractionated: 0.37 IU/mL (ref 0.30–0.70)
Heparin Unfractionated: 0.38 IU/mL (ref 0.30–0.70)

## 2021-05-15 LAB — MAGNESIUM: Magnesium: 2.8 mg/dL — ABNORMAL HIGH (ref 1.7–2.4)

## 2021-05-15 MED ORDER — ALBUMIN HUMAN 25 % IV SOLN
12.5000 g | Freq: Two times a day (BID) | INTRAVENOUS | Status: AC
  Administered 2021-05-15 (×2): 12.5 g via INTRAVENOUS
  Filled 2021-05-15 (×2): qty 50

## 2021-05-15 MED ORDER — INSULIN GLARGINE 100 UNIT/ML ~~LOC~~ SOLN
35.0000 [IU] | Freq: Two times a day (BID) | SUBCUTANEOUS | Status: DC
  Administered 2021-05-15 – 2021-05-18 (×6): 35 [IU] via SUBCUTANEOUS
  Filled 2021-05-15 (×9): qty 0.35

## 2021-05-15 MED ORDER — FUROSEMIDE 10 MG/ML IJ SOLN
60.0000 mg | Freq: Two times a day (BID) | INTRAMUSCULAR | Status: AC
  Administered 2021-05-15 (×2): 60 mg via INTRAVENOUS
  Filled 2021-05-15 (×2): qty 6

## 2021-05-15 MED ORDER — INSULIN ASPART 100 UNIT/ML IJ SOLN
7.0000 [IU] | INTRAMUSCULAR | Status: DC
  Administered 2021-05-15 – 2021-05-17 (×12): 7 [IU] via SUBCUTANEOUS

## 2021-05-15 NOTE — Progress Notes (Addendum)
[place] for argatroban> heparin  Indication: afib  Allergies  Allergen Reactions  . Tussionex Pennkinetic Er [Hydrocod Polst-Cpm Polst Er] Other (See Comments)    UNSPECIFIED REACTION  > "caused prostate problems"  . Ace Inhibitors Cough    Patient Measurements: Height: 5\' 7"  (170.2 cm) Weight: 120.6 kg (265 lb 14 oz) IBW/kg (Calculated) : 66.1 Heparin Dosing Weight: 125kg  Vital Signs: Temp: 99.3 F (37.4 C) (06/05 0804) Temp Source: Oral (06/05 0804) BP: 122/61 (06/05 0700) Pulse Rate: 95 (06/05 0700)  Labs: Recent Labs    05/12/21 1504 05/12/21 1504 05/13/21 0412 05/13/21 2257 05/14/21 0500 05/14/21 0816 05/14/21 1538 05/15/21 0500  HGB  --    < > 14.7  --  14.9  --   --  14.5  HCT  --   --  46.2  --  48.0  --   --  47.3  PLT  --   --  111*  --  137*  --   --  107*  APTT 45*  --  77* 36  --  45* 47*  --   HEPARINUNFRC 0.44  --  0.76*  --   --   --   --  0.37  CREATININE  --   --  1.68*  --  1.82*  --   --  1.89*   < > = values in this interval not displayed.    Estimated Creatinine Clearance: 41.3 mL/min (A) (by C-G formula based on SCr of 1.89 mg/dL (H)).  Assessment: 75 yo male with h/o Afib, Xarelto on hold, new thrombocytopenia on heparin and concern for HIT so was switched to argatroban. Heparin antibody was negative, so switched back to heparin on 05/14/21.  Initial heparin level (0.37) therapeutic, on drip rate 1300 units/hr. Hgb wnl 14.5, pltc 107. No overt bleeding or infusion issues noted.    Goal of Therapy:  aPTT 50-90 seconds Monitor platelets by anticoagulation protocol: Yes   Plan:  Continue IV heparin at 1300 units/hr Check confirmatory 8hr HL Monitor daily HL, CBC, s/sx bleeding  ============================================= PM Update: confirmatory HL remains therapeutic. No overt bleeding or infusion issues noted. - Continue IV heparin at 1300 units/hr - Daily HL, CBC, s/sx bleeding  Richardine Service, PharmD, BCPS PGY2 Cardiology Pharmacy Resident Phone: [phone] 05/15/2021  8:28 AM  Please check [email or web address] for unit-specific pharmacy phone numbers.

## 2021-05-15 NOTE — Progress Notes (Signed)
NAME:  [name], MRN:  [id number], DOB:  Mar 21, 1946, LOS: 6 ADMISSION DATE:  04/24/2021, CONSULTATION DATE:  05/09/2021 REFERRING MD:  Dr. Randal Buba, CHIEF COMPLAINT:  SOB  History of Present Illness:  [name] is a 75 yo M with past medical hx as below presenting from home with complaints of SOB/ respiratory distress on 5/29.  Treated by EMS with duonebs, epi, solumedrol, and mag without improvement, requiring BVM.  On arrival to ER, patient required intubation as he was peri- arrest, unresponsive, bradycardic, temp 95.9, hypotensive, and apneic with diffuse apical wheeze requiring emergent intubation on arrival.  Noted to have pink frothy secretions on intubation.  PCCM called for admit.   Pertinent  Medical History  Morbid obesity, HFrEF (12/24/20 EF 40-45%, G2DD, PAF, mildly reduced systolic RV, mild MR), VT on amio/ ICD, CAD s/p CABG 1996, LBBB, HLD, COPD, OSA on CPAP, AAA (s/p repair 2015), NSCL lung CA s/p XRT 2018, anemia, BPH, DMT2, GERD, hiatal hernia, PVD, tremor  Significant Hospital Events: Including procedures, antibiotic start and stop dates in addition to other pertinent events   . 5/30 admitted for respiratory failure/ intubated, empiric CAP coverage/ duresis . 6/1 BLLE Duplex> no DVT, unable to complete thoracentesis due to anatomy  . 6/2 Tmax 101, -2.9L in last 24 hours, on levo at 68mcg, precedex. Resp culture negative. BC pending. . 6/3 switched from heparin to argatroban for thrombocytopenia, HIT ab pending . 6/4 HIT AB negative, switched back to heparin, failed SBT due to tachypnea . 6/5   Interim History / Subjective:  He denies complaints this morning. On SBT 12 PS.  Objective   Blood pressure 116/73, pulse 91, temperature 99.3 F (37.4 C), temperature source Axillary, resp. rate 15, height 5\' 7"  (1.702 m), weight 120.6 kg, SpO2 97 %.    Vent Mode: PRVC FiO2 (%):  [30 %-35 %] 30 % Set Rate:  [24 bmp] 24 bmp Vt Set:  [530 mL] 530 mL PEEP:  [5 cmH20] 5  cmH20 Pressure Support:  [12 cmH20] 12 cmH20 Plateau Pressure:  [16 cmH20-18 cmH20] 18 cmH20   Intake/Output Summary (Last 24 hours) at 05/15/2021 0726 Last data filed at 05/15/2021 0600 Gross per 24 hour  Intake 2214.27 ml  Output 4250 ml  Net -2035.73 ml   Net for admission -7.9 L  Filed Weights   05/13/21 0600 05/14/21 0326 05/15/21 0455  Weight: 125.9 kg 121.7 kg 120.6 kg    Examination: General: critically ill appearing man lying in bed in NAD  HEENT: [place]/AT, ETT in place, eyes anicteric Neuro: RASS 0, moving all extremities, nodding to answer questions CV: S1S2, RRR PULM: tachypneic but controlled RR, no rhonchi, no ETT secretions GI: obese, soft, NT, ND Extremities: improving pedal edema Skin: no rashes or ecchymoses   Labs/imaging that I havepersonally reviewed  (right click and "Reselect all SmartList Selections" daily)  coox 72% on 6/4  Na+ 148 BUN 83 Cr 1.89 WBC 17.5 Platelets 107 Echo 6/2> LVEF 20-25%, severely decreased function, global hypokinesis. Mild concentric LVH. Normal RV size and function. Mild MR. Mild to moderate aortic sclerosis. IVC with reduced respiratory variability.   Resolved Hospital Problem list    Assessment & Plan:   Shock- suspected septic with pneumonia as source, complicated by poor cardiac function. Not requiring pressors when off sedation. -con't antibiotics  -Continue vasopressors as required to maintain MAP >65 -BNP to help guide diuresis; recheck tomorrow  Acute hypoxic/ hypercarbic respiratory failure with sL>R multifocal infiltrates c/w CAP Hx  of lung cancer with 3 recurrences with baseline abnormal CXR COPD with questionable acute exacerbation Failed SBTs due to WOB and agitation since 5/31.  -Continue lasix 60 mg BID x2 doses again today. Adding albumin. -continue ceftriaxone to complete 7 days -LTVV, 4-8cc/kg IBW with goal Pplat<30 and DP<15 -VAP prevention protocol -PAD protocol for sedation -daily SAT & SBT as  appropriate> requiring too high level of PS for extubation  HFrEF (12/24/20 EF 40-45%, G2DD, mildly reduced systolic RV, mild MR) >> LVEF down to 20-25% this admission Hx of VT on amio w/ICD Hx CAD s/p CABG 1996  Hx LBBB HTN PAF -diuresis; BNP level in AM -levophed as needed for MAP >65 -con't heparin; keep watching platelets. HIT Ab negative -enteral amiodarone -atorvastatin -hold home entresto, spironolactone, torsemide, imdur, carvedilol given shock -tele monitoring  -maintain appropriate electrolytes  AKI on CKD 3b BPHon PTA finasteride Hypernatremia, worsened Phimosis - required bedside dilation by urology for foley placement 5/30 -diuresis -con't free water  -strict I/OS -renally dose meds, avoid nephrotoxic meds -con't to monitor renal function -continue bethanechol -foley needs to remain in place -per urology may need OP follow up for surgery to address phimosis; unfortunately with ongoing AKI and phimosis still needs foley  Leukocytosis- persistent. Fevers resolved. -con't to monitor -completing 7-day course of antibiotics  Hypothyroidism -con't synthroid  Hyperglycemia, uncontrolled -increase TF coverage to 7 units Q4  -increase lantus 35 units BID  -SSI, moderate scale  -goal BG 140-180  Class 3 obesity -education when able   Baseline anxiety disorder -klonopin BID   Thrombocytopenia; 4T score= 4, intermediate risk for HIT, although B- lactams can also decrease platelets. HIT Ab negative -if platelets keep dropping, will check SRA -con't heparin  No family present at bedside during rounds today.  Best practice (right click and "Reselect all SmartList Selections" daily)  Diet:  TF Pain/Anxiety/Delirium protocol (if indicated): Yes (RASS goal 0) VAP protocol (if indicated): Yes DVT prophylaxis: Systemic AC GI prophylaxis: PPI Glucose control:  Basal bolus, SSI Central venous access:  Yes, and it is still needed Arterial line:  no Foley:  Yes,  and it is still needed Mobility:  bed rest  PT consulted: [name] Last date of multidisciplinary goals of care discussion:  Code Status:  full code Disposition: ICU  This patient is critically ill with multiple organ system failure which requires frequent high complexity decision making, assessment, support, evaluation, and titration of therapies. This was completed through the application of advanced monitoring technologies and extensive interpretation of multiple databases. During this encounter critical care time was devoted to patient care services described in this note for 333 minutes.  Julian Hy, DO 05/15/21 5:47 PM [facility]  For contact information, see Amion. If no response to pager, please call PCCM consult pager. After hours, 7PM- 7AM, please call Elink.

## 2021-05-15 NOTE — Progress Notes (Signed)
PT Cancellation Note  Patient Details Name: [name] MRN: [id number] DOB: 12/22/1945   Cancelled Treatment:    Reason Eval/Treat Not Completed: Patient not medically ready. Pt remains intubated, sedated upon PT arrival.   Zenaida Niece 05/15/2021, 3:32 PM

## 2021-05-16 DIAGNOSIS — I5020 Unspecified systolic (congestive) heart failure: Secondary | ICD-10-CM

## 2021-05-16 DIAGNOSIS — R29898 Other symptoms and signs involving the musculoskeletal system: Secondary | ICD-10-CM

## 2021-05-16 LAB — CBC
HCT: 44.3 % (ref 39.0–52.0)
Hemoglobin: 13.8 g/dL (ref 13.0–17.0)
MCH: 31.2 pg (ref 26.0–34.0)
MCHC: 31.2 g/dL (ref 30.0–36.0)
MCV: 100 fL (ref 80.0–100.0)
Platelets: 120 10*3/uL — ABNORMAL LOW (ref 150–400)
RBC: 4.43 MIL/uL (ref 4.22–5.81)
RDW: 13.2 % (ref 11.5–15.5)
WBC: 18.4 10*3/uL — ABNORMAL HIGH (ref 4.0–10.5)
nRBC: 0.1 % (ref 0.0–0.2)

## 2021-05-16 LAB — GLUCOSE, CAPILLARY
Glucose-Capillary: 155 mg/dL — ABNORMAL HIGH (ref 70–99)
Glucose-Capillary: 175 mg/dL — ABNORMAL HIGH (ref 70–99)
Glucose-Capillary: 186 mg/dL — ABNORMAL HIGH (ref 70–99)
Glucose-Capillary: 192 mg/dL — ABNORMAL HIGH (ref 70–99)
Glucose-Capillary: 207 mg/dL — ABNORMAL HIGH (ref 70–99)
Glucose-Capillary: 209 mg/dL — ABNORMAL HIGH (ref 70–99)
Glucose-Capillary: 217 mg/dL — ABNORMAL HIGH (ref 70–99)

## 2021-05-16 LAB — BASIC METABOLIC PANEL
Anion gap: 8 (ref 5–15)
BUN: 90 mg/dL — ABNORMAL HIGH (ref 8–23)
CO2: 28 mmol/L (ref 22–32)
Calcium: 8.6 mg/dL — ABNORMAL LOW (ref 8.9–10.3)
Chloride: 111 mmol/L (ref 98–111)
Creatinine, Ser: 1.88 mg/dL — ABNORMAL HIGH (ref 0.61–1.24)
GFR, Estimated: 37 mL/min — ABNORMAL LOW (ref >60)
Glucose, Bld: 213 mg/dL — ABNORMAL HIGH (ref 70–99)
Potassium: 4.2 mmol/L (ref 3.5–5.1)
Sodium: 147 mmol/L — ABNORMAL HIGH (ref 135–145)

## 2021-05-16 LAB — HEPARIN LEVEL (UNFRACTIONATED): Heparin Unfractionated: 0.41 IU/mL (ref 0.30–0.70)

## 2021-05-16 LAB — BRAIN NATRIURETIC PEPTIDE: B Natriuretic Peptide: 78.5 pg/mL (ref 0.0–100.0)

## 2021-05-16 MED ORDER — FUROSEMIDE 10 MG/ML IJ SOLN
40.0000 mg | Freq: Two times a day (BID) | INTRAMUSCULAR | Status: DC
  Administered 2021-05-16 – 2021-05-17 (×4): 40 mg via INTRAVENOUS
  Filled 2021-05-16 (×4): qty 4

## 2021-05-16 MED ORDER — PANTOPRAZOLE SODIUM 40 MG PO PACK
40.0000 mg | PACK | Freq: Every day | ORAL | Status: DC
  Administered 2021-05-16 – 2021-05-27 (×12): 40 mg
  Filled 2021-05-16 (×12): qty 20

## 2021-05-16 NOTE — Progress Notes (Signed)
NAME:  [name], MRN:  [id number], DOB:  15-Sep-1946, LOS: 7 ADMISSION DATE:  04/19/2021, CONSULTATION DATE:  05/09/2021 REFERRING MD:  Dr. Randal Buba, CHIEF COMPLAINT:  SOB  History of Present Illness:  Mr. Pavek is a 75 yo M with past medical hx as below presenting from home with complaints of SOB/ respiratory distress on 5/29.  Treated by EMS with duonebs, epi, solumedrol, and mag without improvement, requiring BVM.  On arrival to ER, patient required intubation as he was peri- arrest, unresponsive, bradycardic, temp 95.9, hypotensive, and apneic with diffuse apical wheeze requiring emergent intubation on arrival.  Noted to have pink frothy secretions on intubation.  PCCM called for admit.   Pertinent  Medical History  Morbid obesity, HFrEF (12/24/20 EF 40-45%, G2DD, PAF, mildly reduced systolic RV, mild MR), VT on amio/ ICD, CAD s/p CABG 1996, LBBB, HLD, COPD, OSA on CPAP, AAA (s/p repair 2015), NSCL lung CA s/p XRT 2018, anemia, BPH, DMT2, GERD, hiatal hernia, PVD, tremor  Significant Hospital Events: Including procedures, antibiotic start and stop dates in addition to other pertinent events   . 5/30 admitted for respiratory failure/ intubated, empiric CAP coverage/ duresis . 6/1 BLLE Duplex> no DVT, unable to complete thoracentesis due to anatomy  . 6/2 Tmax 101, -2.9L in last 24 hours, on levo at 56mcg, precedex. Resp culture negative. BC pending. . 6/3 switched from heparin to argatroban for thrombocytopenia, HIT ab pending . 6/4 HIT AB negative, switched back to heparin, failed SBT due to tachypnea . 6/5   Interim History / Subjective:  No acute events ON. Worked with PT this AM.  Intubated w/ no sedation during exam.  Objective   Blood pressure 117/74, pulse 85, temperature 100 F (37.8 C), temperature source Axillary, resp. rate (!) 30, height 5\' 7"  (1.702 m), weight 120.1 kg, SpO2 97 %. CVP:  [6 mmHg] 6 mmHg  Vent Mode: PSV;CPAP FiO2 (%):  [30 %] 30 % Set Rate:  [24 bmp] 24  bmp Vt Set:  [530 mL] 530 mL PEEP:  [5 cmH20] 5 cmH20 Pressure Support:  [10 cmH20-12 cmH20] 10 cmH20 Plateau Pressure:  [16 cmH20-20 cmH20] 18 cmH20   Intake/Output Summary (Last 24 hours) at 05/16/2021 1216 Last data filed at 05/16/2021 0800 Gross per 24 hour  Intake 2617.32 ml  Output 2590 ml  Net 27.32 ml   Net for admission -7.9 L  Filed Weights   05/14/21 0326 05/15/21 0455 05/16/21 0406  Weight: 121.7 kg 120.6 kg 120.1 kg    Examination: General: critically ill-appearing man lying in bed in NAD  HEENT: [place]/AT, ETT in place, eyes anicteric, eyes follow me when I move but pt does not direct gaze to follow finger movement on command Neuro: RASS 0, waves goodbye with left hand, no other movement of extremities CV: S1S2, RRR PULM: tachypneic but controlled RR, no rhonchi, no ETT secretions GI: obese, soft, NT, ND Extremities: trace pedal edema, feet cool to touch bilaterally Skin: no rashes or ecchymoses, scattered bruising on upper extremities bilaterally   Labs/imaging that I havepersonally reviewed  (right click and "Reselect all SmartList Selections" daily)  Labs 6/6: Na+ 147 BUN 90 Cr 1.88 WBC 18.4 Platelets 120 BNP 78.5  coox 72% on 6/4  Echo 6/2> LVEF 20-25%, severely decreased function, global hypokinesis. Mild concentric LVH. Normal RV size and function. Mild MR. Mild to moderate aortic sclerosis. IVC with reduced respiratory variability.   Resolved Hospital Problem list    Assessment & Plan:   Shock-  suspected septic with pneumonia as source, complicated by poor cardiac function. Not requiring pressors when off sedation. - S/p 7 day course antibiotics: Azithromycin (5/29-6/2), Ceftriaxone (5/29-6/5) - Afebrile, improved secretions, persistently elevated WBC - Continue vasopressors as required to maintain MAP >65 - BNP downtrending, wnl on re-check today 6/6 - Decrease lasix from 60  Mg BID to 40 mg BID  Acute hypoxic/ hypercarbic respiratory failure with  sL>R multifocal infiltrates c/w CAP Hx of lung cancer with 3 recurrences with baseline abnormal CXR COPD with questionable acute exacerbation - Failed SBTs due to WOB and agitation since 5/31 - S/p 7 day course antibiotics: Azithromycin (5/29-6/2), Ceftriaxone (5/29-6/5)  - S/p albumin 6/5 - Decrease lasix from 60  Mg BID to 40 mg BID today.  - LTVV, 4-8cc/kg IBW with goal Pplat<30 and DP<15 - VAP prevention protocol - PAD protocol for sedation - Daily SAT & SBT as appropriate > requiring too high level of PS for extubation  HFrEF (12/24/20 EF 40-45%, G2DD, mildly reduced systolic RV, mild MR) >> LVEF down to 20-25% this admission Hx of VT on amio w/ICD Hx CAD s/p CABG 1996  Hx LBBB HTN PAF - Diuresis - Levophed as needed for MAP >65 - Con't heparin; keep watching platelets. HIT Ab negative - Enteral amiodarone - Atorvastatin - Hold home entresto, spironolactone, torsemide, imdur, carvedilol given shock - Tele monitoring  - Maintain appropriate electrolytes  AKI on CKD 3b BPHon PTA finasteride Hypernatremia, worsened Phimosis - required bedside dilation by urology for foley placement 5/30 - Diuresis - Con't free water  - Strict I/OS - Renally dose meds, avoid nephrotoxic meds - Con't to monitor renal function - Continue bethanechol - Foley needs to remain in place - Per urology may need OP follow up for surgery to address phimosis; unfortunately with ongoing AKI and phimosis still needs foley  Leukocytosis- persistent. Fevers resolved. - S/p 7-day course of antibiotics (5/29-6/5) - Con't to monitor  Thrombocytopenia 4T score= 4, intermediate risk for HIT, although B- lactams can also decrease platelets. HIT Ab negative - Plts increased today - If platelets drop, can check SRA - Con't heparin  Hyperglycemia, uncontrolled - BGs in low 200s following insulin adjustment yesterday - Goal BG 140-180 - Continue with current dosages for today, may re-adjust tomorrow  -  NovoLOG 7 units Q4   - Lantus 35 units BID   - SSI, moderate scale   Hypothyroidism - Con't synthroid  Class 3 obesity - Education when able   Baseline anxiety disorder - Klonopin BID    No family present at bedside during rounds today.  Best practice (right click and "Reselect all SmartList Selections" daily)  Diet:  TF Pain/Anxiety/Delirium protocol (if indicated): Yes (RASS goal 0) VAP protocol (if indicated): Yes DVT prophylaxis: Systemic AC GI prophylaxis: PPI Glucose control:  Basal bolus, SSI Central venous access:  Yes, and it is still needed Arterial line:  no Foley:  Yes, and it is still needed Mobility:  bed rest  PT consulted: [name] Last date of multidisciplinary goals of care discussion:  Code Status:  full code Disposition: ICU  This patient is critically ill with multiple organ system failure which requires frequent high complexity decision making, assessment, support, evaluation, and titration of therapies. This was completed through the application of advanced monitoring technologies and extensive interpretation of multiple databases. During this encounter critical care time was devoted to patient care services described in this note for 333 minutes.  Margot Chimes, medical student 05/16/21 12:16 PM  [facility]  For contact information, see Amion. If no response to pager, please call PCCM consult pager. After hours, 7PM- 7AM, please call Elink.

## 2021-05-16 NOTE — Progress Notes (Signed)
[place] for argatroban> heparin  Indication: afib  Allergies  Allergen Reactions  . Tussionex Pennkinetic Er [Hydrocod Polst-Cpm Polst Er] Other (See Comments)    UNSPECIFIED REACTION  > "caused prostate problems"  . Ace Inhibitors Cough    Patient Measurements: Height: 5\' 7"  (170.2 cm) Weight: 120.1 kg (264 lb 12.4 oz) IBW/kg (Calculated) : 66.1 Heparin Dosing Weight: 125kg  Vital Signs: Temp: 99.7 F (37.6 C) (06/06 0830) Temp Source: Oral (06/06 0830) BP: 117/74 (06/06 0800) Pulse Rate: 85 (06/06 0800)  Labs: Recent Labs    05/13/21 2257 05/14/21 0500 05/14/21 0500 05/14/21 0816 05/14/21 1538 05/15/21 0500 05/15/21 1300 05/16/21 0340  HGB  --  14.9   < >  --   --  14.5  --  13.8  HCT  --  48.0  --   --   --  47.3  --  44.3  PLT  --  137*  --   --   --  107*  --  120*  APTT 36  --   --  45* 47*  --   --   --   HEPARINUNFRC  --   --   --   --   --  0.37 0.38 0.41  CREATININE  --  1.82*  --   --   --  1.89*  --  1.88*   < > = values in this interval not displayed.    Estimated Creatinine Clearance: 41.5 mL/min (A) (by C-G formula based on SCr of 1.88 mg/dL (H)).  Assessment: 75 yo male with h/o Afib, Xarelto on hold, new thrombocytopenia on heparin and concern for HIT so was switched to argatroban. Heparin antibody was negative, so switched back to heparin on 05/14/21.  Heparin level (0.41) therapeutic, on drip rate 1300 units/hr. Hgb wnl 14.5, pltc 107. No overt bleeding or infusion issues noted.    Goal of Therapy:  aPTT 50-90 seconds Monitor platelets by anticoagulation protocol: Yes   Plan:  Continue Heparin drip 1300 uts/hr  Daily heparin level and CBC  Monitor s/s bleeding    Bonnita Nasuti Pharm.D. CPP, BCPS Clinical Pharmacist [phone] 05/16/2021 10:19 AM

## 2021-05-16 NOTE — Progress Notes (Signed)
Inpatient Diabetes Program Recommendations  AACE/ADA: New Consensus Statement on Inpatient Glycemic Control (2015)  Target Ranges:  Prepandial:   less than 140 mg/dL      Peak postprandial:   less than 180 mg/dL (1-2 hours)      Critically ill patients:  140 - 180 mg/dL   Lab Results  Component Value Date   GLUCAP 192 (H) 05/16/2021   HGBA1C 5.9 (H) 05/09/2021    Review of Glycemic Control Results for [name], [name] (MRN [id number]) as of 05/16/2021 10:56  Ref. Range 05/15/2021 15:58 05/15/2021 19:25 05/15/2021 23:37 05/16/2021 03:23 05/16/2021 03:42  Glucose-Capillary Latest Ref Range: 70 - 99 mg/dL 153 (H) 215 (H) 188 (H) 209 (H) 192 (H)  Outpatient Diabetes medications: Farxiga 10 mg daily Current orders for Inpatient glycemic control: Novolog resistant q 4 hours Vital 45 cc/ hr Lantus 35units bid  Novolog 7 units q 4 hours Inpatient Diabetes Program Recommendations:    Consider increasing Novolog tube feed coverage to 9 units q 4 hours.   Thanks,  Adah Perl, RN, BC-ADM Inpatient Diabetes Coordinator Pager [phone] (8a-5p)

## 2021-05-16 NOTE — Evaluation (Signed)
Physical Therapy Evaluation Patient Details Name: [name] MRN: [id number] DOB: 1946/12/09 Today's Date: 05/16/2021   History of Present Illness  75 yo admitted 5/30 with acute respiratory distress requiring intubation. PMhx: Morbid obesity, HFrEF, VT on amio/ ICD, CAD s/p CABG 1996, LBBB, HLD, COPD, OSA on CPAP, AAA (s/p repair 2015), NSCL lung CA s/p XRT 2018, anemia, BPH, DMT2, GERD, hiatal hernia, PVD, tremor  Clinical Impression  Pt awake and able to attend to yes/no questions with head nods. Pt with decreased attention needing increased time and repetition to complete movement but able to actively participate in bil UE and LE HEP. Pt transitioned to sitting and able to pull trunk off of surface with bil UE but unable to maintain sitting balance with left lean. Pt with decreased strength, function, balance, cognition and mobility who will benefit from acute therapy to maximize mobility, safety, strength and function to decrease burden of care.   Pt on 30% CPAP throughout with peep5, SpO2 97%, ETT 25cm HR 84-87 139/74 (92)    Follow Up Recommendations LTACH    Equipment Recommendations  Other (comment) (TBD)    Recommendations for Other Services       Precautions / Restrictions Precautions Precautions: Fall;Other (comment) Precaution Comments: ETT, cortrak, vent Restrictions Weight Bearing Restrictions: No      Mobility  Bed Mobility Overal bed mobility: Needs Assistance Bed Mobility: Supine to Sit;Sit to Supine     Supine to sit: Total assist Sit to supine: Total assist   General bed mobility comments: total assist of bed with foot egress positioning to transition from supine<>sitting. Total +2 to slide toward HOB. Max assist for sitting balance    Transfers                 General transfer comment: not yet ready  Ambulation/Gait                Stairs            Wheelchair Mobility    Modified Rankin (Stroke Patients Only)        Balance Overall balance assessment: Needs assistance Sitting-balance support: Bilateral upper extremity supported Sitting balance-Leahy Scale: Zero Sitting balance - Comments: in chair position pt able to pull forward to unsupported sitting but with left lean and kyphotic posture sitting with max assist Postural control: Left lateral lean                                   Pertinent Vitals/Pain Pain Assessment: No/denies pain    Home Living Family/patient expects to be discharged to:: Private residence   Available Help at Discharge: Family;Available 24 hours/day Type of Home: House       Home Layout: One level        Prior Function Level of Independence: Independent         Comments: pt providing information through head nods to questions     Hand Dominance        Extremity/Trunk Assessment   Upper Extremity Assessment Upper Extremity Assessment: Generalized weakness    Lower Extremity Assessment Lower Extremity Assessment: Generalized weakness    Cervical / Trunk Assessment Cervical / Trunk Assessment: Kyphotic (pt with left lean in unsupported sitting with flexed trunk)  Communication   Communication: Other (comment) (ETT)  Cognition Arousal/Alertness: Awake/alert Behavior During Therapy: Flat affect Overall Cognitive Status: Impaired/Different from baseline Area of Impairment: Attention;Following commands  Current Attention Level: Focused   Following Commands: Follows one step commands consistently;Follows one step commands with increased time              General Comments      Exercises General Exercises - Upper Extremity Shoulder Flexion: AAROM;Both;10 reps;Seated Elbow Flexion: AAROM;Both;Seated;10 reps Elbow Extension: AAROM;Both;Seated;10 reps General Exercises - Lower Extremity Long Arc Quad: AAROM;Right;Seated;10 reps Heel Slides: AAROM;Both;Seated;10 reps   Assessment/Plan    PT  Assessment Patient needs continued PT services  PT Problem List Decreased strength;Decreased mobility;Decreased range of motion;Decreased activity tolerance;Decreased knowledge of use of DME;Cardiopulmonary status limiting activity;Decreased cognition       PT Treatment Interventions DME instruction;Therapeutic exercise;Functional mobility training;Therapeutic activities;Patient/family education;Balance training;Neuromuscular re-education;Cognitive remediation    PT Goals (Current goals can be found in the Care Plan section)  Acute Rehab PT Goals PT Goal Formulation: Patient unable to participate in goal setting Time For Goal Achievement: 05/30/21 Potential to Achieve Goals: Fair    Frequency Min 3X/week   Barriers to discharge        Co-evaluation               AM-PAC PT "6 Clicks" Mobility  Outcome Measure Help needed turning from your back to your side while in a flat bed without using bedrails?: Total Help needed moving from lying on your back to sitting on the side of a flat bed without using bedrails?: Total Help needed moving to and from a bed to a chair (including a wheelchair)?: Total Help needed standing up from a chair using your arms (e.g., wheelchair or bedside chair)?: Total Help needed to walk in hospital room?: Total Help needed climbing 3-5 steps with a railing? : Total 6 Click Score: 6    End of Session   Activity Tolerance: Patient tolerated treatment well Patient left: in bed;with call bell/phone within reach;with nursing/sitter in room Nurse Communication: Mobility status;Need for lift equipment PT Visit Diagnosis: Other abnormalities of gait and mobility (R26.89);Difficulty in walking, not elsewhere classified (R26.2);Muscle weakness (generalized) (M62.81)    Time: 1937-9024 PT Time Calculation (min) (ACUTE ONLY): 24 min   Charges:   PT Evaluation $PT Eval High Complexity: 1 High PT Treatments $Therapeutic Activity: 8-22 mins        Shabana Armentrout  P, PT Acute Rehabilitation Services Pager: [phone] Office: [facility] 05/16/2021, 9:58 AM

## 2021-05-17 ENCOUNTER — Inpatient Hospital Stay (HOSPITAL_COMMUNITY): Payer: Medicare Other

## 2021-05-17 DIAGNOSIS — R579 Shock, unspecified: Secondary | ICD-10-CM

## 2021-05-17 LAB — BASIC METABOLIC PANEL
Anion gap: 8 (ref 5–15)
Anion gap: 10 (ref 5–15)
BUN: 92 mg/dL — ABNORMAL HIGH (ref 8–23)
BUN: 85 mg/dL — ABNORMAL HIGH (ref 8–23)
CO2: 26 mmol/L (ref 22–32)
CO2: 25 mmol/L (ref 22–32)
Calcium: 8.6 mg/dL — ABNORMAL LOW (ref 8.9–10.3)
Calcium: 8.2 mg/dL — ABNORMAL LOW (ref 8.9–10.3)
Chloride: 111 mmol/L (ref 98–111)
Chloride: 113 mmol/L — ABNORMAL HIGH (ref 98–111)
Creatinine, Ser: 1.74 mg/dL — ABNORMAL HIGH (ref 0.61–1.24)
Creatinine, Ser: 1.53 mg/dL — ABNORMAL HIGH (ref 0.61–1.24)
GFR, Estimated: 40 mL/min — ABNORMAL LOW (ref >60)
GFR, Estimated: 47 mL/min — ABNORMAL LOW (ref >60)
Glucose, Bld: 220 mg/dL — ABNORMAL HIGH (ref 70–99)
Glucose, Bld: 185 mg/dL — ABNORMAL HIGH (ref 70–99)
Potassium: 4.1 mmol/L (ref 3.5–5.1)
Potassium: 4.1 mmol/L (ref 3.5–5.1)
Sodium: 145 mmol/L (ref 135–145)
Sodium: 148 mmol/L — ABNORMAL HIGH (ref 135–145)

## 2021-05-17 LAB — HEPARIN LEVEL (UNFRACTIONATED): Heparin Unfractionated: 0.39 IU/mL (ref 0.30–0.70)

## 2021-05-17 LAB — CBC
HCT: 43.9 % (ref 39.0–52.0)
Hemoglobin: 13.5 g/dL (ref 13.0–17.0)
MCH: 31.2 pg (ref 26.0–34.0)
MCHC: 30.8 g/dL (ref 30.0–36.0)
MCV: 101.4 fL — ABNORMAL HIGH (ref 80.0–100.0)
Platelets: 106 10*3/uL — ABNORMAL LOW (ref 150–400)
RBC: 4.33 MIL/uL (ref 4.22–5.81)
RDW: 13.3 % (ref 11.5–15.5)
WBC: 15.7 10*3/uL — ABNORMAL HIGH (ref 4.0–10.5)
nRBC: 0 % (ref 0.0–0.2)

## 2021-05-17 LAB — MAGNESIUM
Magnesium: 2.9 mg/dL — ABNORMAL HIGH (ref 1.7–2.4)
Magnesium: 2.9 mg/dL — ABNORMAL HIGH (ref 1.7–2.4)

## 2021-05-17 LAB — GLUCOSE, CAPILLARY
Glucose-Capillary: 207 mg/dL — ABNORMAL HIGH (ref 70–99)
Glucose-Capillary: 188 mg/dL — ABNORMAL HIGH (ref 70–99)
Glucose-Capillary: 276 mg/dL — ABNORMAL HIGH (ref 70–99)
Glucose-Capillary: 180 mg/dL — ABNORMAL HIGH (ref 70–99)
Glucose-Capillary: 74 mg/dL (ref 70–99)
Glucose-Capillary: 176 mg/dL — ABNORMAL HIGH (ref 70–99)

## 2021-05-17 LAB — PROCALCITONIN: Procalcitonin: 0.16 ng/mL

## 2021-05-17 MED ORDER — DOCUSATE SODIUM 50 MG/5ML PO LIQD
100.0000 mg | Freq: Two times a day (BID) | ORAL | Status: DC | PRN
  Administered 2021-05-19: 100 mg
  Filled 2021-05-17: qty 10

## 2021-05-17 MED ORDER — FENTANYL CITRATE (PF) 100 MCG/2ML IJ SOLN
25.0000 ug | INTRAMUSCULAR | Status: DC | PRN
  Administered 2021-05-18 – 2021-05-23 (×8): 100 ug via INTRAVENOUS
  Administered 2021-05-24 – 2021-05-25 (×4): 50 ug via INTRAVENOUS
  Administered 2021-05-25: 100 ug via INTRAVENOUS
  Administered 2021-05-25: 50 ug via INTRAVENOUS
  Filled 2021-05-17 (×12): qty 2

## 2021-05-17 MED ORDER — AMIODARONE HCL IN DEXTROSE 360-4.14 MG/200ML-% IV SOLN
60.0000 mg/h | INTRAVENOUS | Status: AC
  Administered 2021-05-17: 60 mg/h via INTRAVENOUS

## 2021-05-17 MED ORDER — VITAL 1.5 CAL PO LIQD
1000.0000 mL | ORAL | Status: DC
  Administered 2021-05-17 – 2021-05-29 (×16): 1000 mL
  Filled 2021-05-17: qty 1000

## 2021-05-17 MED ORDER — MIDAZOLAM HCL 2 MG/2ML IJ SOLN: 1.0000 mg | Freq: Once | INTRAMUSCULAR | Status: AC

## 2021-05-17 MED ORDER — POLYETHYLENE GLYCOL 3350 17 G PO PACK
17.0000 g | PACK | Freq: Every day | ORAL | Status: DC
  Administered 2021-05-18 – 2021-05-20 (×4): 17 g
  Filled 2021-05-17 (×5): qty 1

## 2021-05-17 MED ORDER — AMIODARONE HCL IN DEXTROSE 360-4.14 MG/200ML-% IV SOLN
INTRAVENOUS | Status: AC
  Administered 2021-05-17: 60 mg/h via INTRAVENOUS
  Filled 2021-05-17: qty 400

## 2021-05-17 MED ORDER — INSULIN ASPART 100 UNIT/ML IJ SOLN
9.0000 [IU] | INTRAMUSCULAR | Status: DC
  Administered 2021-05-17 – 2021-05-18 (×6): 9 [IU] via SUBCUTANEOUS

## 2021-05-17 MED ORDER — FENTANYL CITRATE (PF) 100 MCG/2ML IJ SOLN
INTRAMUSCULAR | Status: AC
  Administered 2021-05-17: 50 ug via INTRAVENOUS
  Filled 2021-05-17: qty 2

## 2021-05-17 MED ORDER — MIDAZOLAM HCL 2 MG/2ML IJ SOLN
INTRAMUSCULAR | Status: AC
  Administered 2021-05-17: 1 mg via INTRAVENOUS
  Filled 2021-05-17: qty 2

## 2021-05-17 MED ORDER — SODIUM CHLORIDE 0.9 % IV SOLN
2.0000 g | Freq: Two times a day (BID) | INTRAVENOUS | Status: DC
  Administered 2021-05-17 – 2021-05-19 (×4): 2 g via INTRAVENOUS
  Filled 2021-05-17 (×4): qty 2

## 2021-05-17 MED ORDER — AMIODARONE HCL IN DEXTROSE 360-4.14 MG/200ML-% IV SOLN
60.0000 mg/h | INTRAVENOUS | Status: AC
  Administered 2021-05-17 – 2021-05-21 (×9): 30 mg/h via INTRAVENOUS
  Administered 2021-05-22: 60 mg/h via INTRAVENOUS
  Filled 2021-05-17 (×2): qty 200
  Filled 2021-05-17: qty 400
  Filled 2021-05-17 (×7): qty 200

## 2021-05-17 MED ORDER — FENTANYL CITRATE (PF) 100 MCG/2ML IJ SOLN
25.0000 ug | INTRAMUSCULAR | Status: DC | PRN
  Administered 2021-05-19: 25 ug via INTRAVENOUS
  Filled 2021-05-17: qty 2

## 2021-05-17 MED ORDER — AMIODARONE LOAD VIA INFUSION
150.0000 mg | Freq: Once | INTRAVENOUS | Status: AC
Start: 2021-05-17 — End: 2021-05-17
  Administered 2021-05-17: 150 mg via INTRAVENOUS
  Filled 2021-05-17: qty 83.34

## 2021-05-17 MED ORDER — VANCOMYCIN HCL IN DEXTROSE 1-5 GM/200ML-% IV SOLN
1000.0000 mg | INTRAVENOUS | Status: DC
  Administered 2021-05-18: 1000 mg via INTRAVENOUS
  Filled 2021-05-17: qty 200

## 2021-05-17 MED ORDER — VASOPRESSIN 20 UNITS/100 ML INFUSION FOR SHOCK
0.0000 [IU]/min | INTRAVENOUS | Status: DC
  Administered 2021-05-17 – 2021-05-18 (×4): 0.03 [IU]/min via INTRAVENOUS
  Filled 2021-05-17 (×2): qty 100

## 2021-05-17 MED ORDER — VANCOMYCIN HCL 10 G IV SOLR
2500.0000 mg | Freq: Once | INTRAVENOUS | Status: AC
  Administered 2021-05-17: 2500 mg via INTRAVENOUS
  Filled 2021-05-17: qty 2500

## 2021-05-17 MED ORDER — FENTANYL CITRATE (PF) 100 MCG/2ML IJ SOLN: 50.0000 ug | Freq: Once | INTRAMUSCULAR | Status: AC

## 2021-05-17 MED ORDER — SENNA 8.6 MG PO TABS
1.0000 | ORAL_TABLET | Freq: Every day | ORAL | Status: DC
  Administered 2021-05-17 – 2021-05-27 (×6): 8.6 mg
  Filled 2021-05-17 (×7): qty 1

## 2021-05-17 NOTE — Progress Notes (Addendum)
  PCCM interval note  Called to bedside for abrupt tachycardia into the 140-150's, from rate in the 90s- on tele review as been paced vs afib rate controlled Patient remains on NE 2 mcg/min, BP remains stable SBP 130s  Off sedation, eyes open, follows some commands but difficult to discern if having cp.   Rate on vent upper 30s  He has been on enteral amio 200 mg day.  Remains on heparin gtt.  No oxygenation issues.   EKG obtained, read as afib with RVR, also reading as acute MI given some possible STE in ?III, aVF with inverted twaves in lateral leads but ? If this is rate related / lots of respiratory variation  Mag noted 2.9 this am, K 4.1  P:  Will discuss with attending IV amio bolus now f/b gtt  D/c enteral amio Repeat BMET/ mag now Versed 1mg  and fentanyl 50 mcg x 1 given Restart sedation with precedex/ prn vent Change NE to vasopressin CXR now Repeat EKG when rate slowed Check temp - orally 100.2  Addendum: CXR reviewed- worsening right opacity.  Trach asp sent earlier today.  Will repeat MRSA PCR and add empiric vanc/ cefepime per pharmacy for now    Kennieth Rad, ACNP [facility] 05/17/2021, 6:00 PM

## 2021-05-17 NOTE — Progress Notes (Signed)
NAME:  [name], MRN:  [id number], DOB:  Jul 19, 1946, LOS: 8 ADMISSION DATE:  04/13/2021, CONSULTATION DATE:  05/09/2021 REFERRING MD:  Dr. Randal Buba, CHIEF COMPLAINT:  SOB  History of Present Illness:  [name] is a 75 yo M with past medical hx as below presenting from home with complaints of SOB/ respiratory distress on 5/29.  Treated by EMS with duonebs, epi, solumedrol, and mag without improvement, requiring BVM.  On arrival to ER, patient required intubation as he was peri- arrest, unresponsive, bradycardic, temp 95.9, hypotensive, and apneic with diffuse apical wheeze requiring emergent intubation on arrival.  Noted to have pink frothy secretions on intubation.  PCCM called for admit.   Pertinent  Medical History  Morbid obesity, HFrEF (12/24/20 EF 40-45%, G2DD, PAF, mildly reduced systolic RV, mild MR), VT on amio/ ICD, CAD s/p CABG 1996, LBBB, HLD, COPD, OSA on CPAP, AAA (s/p repair 2015), NSCL lung CA s/p XRT 2018, anemia, BPH, DMT2, GERD, hiatal hernia, PVD, tremor  Significant Hospital Events: Including procedures, antibiotic start and stop dates in addition to other pertinent events   . 5/30 admitted for respiratory failure/ intubated, empiric CAP coverage/ duresis . 6/1 BLLE Duplex> no DVT, unable to complete thoracentesis due to anatomy  . 6/2 Tmax 101, -2.9L in last 24 hours, on levo at 110mcg, precedex. Resp culture negative. BC pending. . 6/3 switched from heparin to argatroban for thrombocytopenia, HIT ab pending . 6/4 HIT AB negative, switched back to heparin, failed SBT due to tachypnea  Interim History / Subjective:  No acute events ON. Received dexmedetomidine (6.5->19.6 -> 16.4), then Norepinephrine from 2 AM (1.4 -> 3.8) onwards. Nods when asked if he feels his breathing is ok today. Gestured toward ET tube spontaneouly; nodded when asked if he wanted the tube out.    Objective   Blood pressure 130/62, pulse 73, temperature 98 F (36.7 C), temperature source  Axillary, resp. rate (!) 24, height 5\' 7"  (1.702 m), weight 119.8 kg, SpO2 100 %. CVP:  [3 mmHg-9 mmHg] 6 mmHg  Vent Mode: PRVC FiO2 (%):  [30 %-40 %] 40 % Set Rate:  [24 bmp] 24 bmp Vt Set:  [530 mL] 530 mL PEEP:  [5 cmH20] 5 cmH20 Pressure Support:  [10 TXH74-14 cmH20] 10 cmH20 Plateau Pressure:  [17 cmH20-21 cmH20] 17 cmH20   Intake/Output Summary (Last 24 hours) at 05/17/2021 0706 Last data filed at 05/17/2021 0700 Gross per 24 hour  Intake 2421.07 ml  Output 3125 ml  Net -703.93 ml   Net for admission -7.9 L  Filed Weights   05/15/21 0455 05/16/21 0406 05/17/21 0228  Weight: 120.6 kg 120.1 kg 119.8 kg    Examination: General: critically ill-appearing man lying in bed in NAD  HEENT: [place]/AT, ETT in place,  Neuro: RASS 0, moves all 4 extremities on command CV: S1S2, RRR PULM: tachypnic w/ use of accessory muscles of respiration, no rhonchi, minimal, clear ETT secretions GI: obese, soft, NT, ND Extremities: improved, trace pedal edema Skin: no rashes or ecchymoses, scattered bruising on upper extremities bilaterally   Labs/imaging that I havepersonally reviewed  (right click and "Reselect all SmartList Selections" daily)  Glucose range overnight 155-207  Labs 6/6 -> 6/7:  Na+ 147 -> 145 BUN 90 -> 92 Cr 1.88 ->1.74 WBC 18.4 -> 15.7 Platelets 120 -> 106  BNP 78.5  coox 72% on 6/4  Echo 6/2> LVEF 20-25%, severely decreased function, global hypokinesis. Mild concentric LVH. Normal RV size and function. Mild MR. Mild  to moderate aortic sclerosis. IVC with reduced respiratory variability.   Resolved Hospital Problem list    Assessment & Plan:   Shock- suspected septic with pneumonia as source, complicated by poor cardiac function. Not requiring pressors when off sedation. - S/p 7 day course antibiotics: Azithromycin (5/29-6/2), Ceftriaxone (5/29-6/5) - Fevered once yesterday, afebrile overnight, downtrending WBC - Some secretion in ETT tube - Continue vasopressors as  required to maintain MAP >65 - Euvolemic on exam w/ slightly decreased weight - con't lasix 40 mg BID   Acute hypoxic/ hypercarbic respiratory failure with sL>R multifocal infiltrates c/w CAP Hx of lung cancer with 3 recurrences with baseline abnormal CXR COPD with questionable acute exacerbation - Failed SBTs due to WOB and agitation since 5/31, improved today but unable to adequately increase tidal volume with deep breaths this AM - S/p 7 day course antibiotics: Azithromycin (5/29-6/2), Ceftriaxone (5/29-6/5)  - S/p albumin 6/5 - Con't lasix 40 mg BID today.  - LTVV, 4-8cc/kg IBW with goal Pplat<30 and DP<15 - VAP prevention protocol - PAD protocol for sedation - Daily SAT & SBT as appropriate; hopeful for extubation 6/8 - Check phos 6/8  Deconditioning  - Anticipate prolonged recovery  - Appreciate PT & OT's assistance   HFrEF (12/24/20 EF 40-45%, G2DD, mildly reduced systolic RV, mild MR) >> LVEF down to 20-25% this admission Hx of VT on amio w/ICD Hx CAD s/p CABG 1996  Hx LBBB HTN PAF - Diuresis as above - Levophed as needed for MAP >65 (doing well off pressors yesterday; on pressor overnight) - Con't heparin; keep watching platelets. HIT Ab negative - Enteral amiodarone - Atorvastatin - Hold home entresto, spironolactone, torsemide, imdur, carvedilol given shock - Tele monitoring  - Maintain appropriate electrolytes - Repeat Mag today  AKI on CKD 3b BPHon PTA finasteride Phimosis - required bedside dilation by urology for foley placement 5/30 - more likely advanced CKD unmasked by diuresis as he is now euvolemic  -maintain adequate renal perfusion; doing well off vasopressors  - Diuresis - Con't free water  - Strict I/OS - Renally dose meds, avoid nephrotoxic meds - Con't to monitor renal function - Continue bethanechol - Foley needs to remain in place - Per urology may need OP follow up for surgery to address phimosis; unfortunately with ongoing AKI and phimosis  still needs foley  Leukocytosis- persistent. Last fever 6/6. - S/p 7-day course of antibiotics (5/29-6/5) - Con't to monitor  Thrombocytopenia 4T score= 4, intermediate risk for HIT, although B- lactams can also decrease platelets. HIT Ab negative - Plts decreased today - If platelets drop further, can check SRA - Con't heparin  Hyperglycemia, uncontrolled - Hx T2DM, A1C 5.9 05/09/21 - BGs in low 200s following insulin adjustment yesterday - Goal BG 140-180 - Continue with current dosages for today, may re-adjust tomorrow  - Increase NovoLOG from 7 to 9 units Q4 with hold parameters   - Con't Lantus from 35 units BID    - SSI, moderate scale   Hypernatremia, improving   - Con't FWF  - Con't monitoring   Hypothyroidism - Con't synthroid  Class 3 obesity - Education when able   Baseline anxiety disorder - Klonopin BID    No family present at bedside during rounds today.  Best practice (right click and "Reselect all SmartList Selections" daily)  Diet:  TF Pain/Anxiety/Delirium protocol (if indicated): Yes (RASS goal 0) VAP protocol (if indicated): Yes DVT prophylaxis: Systemic AC GI prophylaxis: PPI Glucose control:  Basal bolus, SSI  Central venous access:  Yes, and it is still needed Arterial line:  no Foley:  Yes, and it is still needed Mobility:  bed rest  PT consulted: [name] Last date of multidisciplinary goals of care discussion:  Code Status:  full code Disposition: ICU  This patient is critically ill with multiple organ system failure which requires frequent high complexity decision making, assessment, support, evaluation, and titration of therapies. This was completed through the application of advanced monitoring technologies and extensive interpretation of multiple databases. During this encounter critical care time was devoted to patient care services described in this note for 60 minutes.  Margot Chimes, medical student 05/17/21 7:06 AM [facility]  For contact information, see Amion. If no response to pager, please call PCCM consult pager. After hours, 7PM- 7AM, please call Elink.

## 2021-05-17 NOTE — Progress Notes (Signed)
Pharmacy Antibiotic Note  [name] is a 75 y.o. male admitted on 04/16/2021 with respiratory failure with hypoxia due to pneumonia.  Pt completed course of azithromycin/ceftriaxone (see below for dates). CXR now with worsening right opacity; trach aspirate sent earlier today. Pharmacy has been consulted for vancomycin and cefepime dosing for HCAP.   WBC 15.7, afebrile; Scr 1.74, CrCl 44.8 ml/min (renal function stable)  Plan: Cefepime 2 gm IV Q 12 hrs Vancomycin 2500 mg IV X 1, followed by vancomycin 1 gram IV Q 24 hrs (estimated vancomycin AUC, using Scr 1.74, is 514.8; goal AUC is 400-550) Monitor WBC, temp, clinical improvement, cultures, renal function, vancomycin levels as indicated  Height: 5\' 7"  (528.4 cm) Weight: 119.8 kg (264 lb 1.8 oz) IBW/kg (Calculated) : 66.1  Temp (24hrs), Avg:98.6 F (37 C), Min:98 F (36.7 C), Max:99.5 F (37.5 C)  Recent Labs  Lab 05/13/21 0412 05/14/21 0500 05/15/21 0500 05/16/21 0340 05/17/21 0210  WBC 11.3* 15.0* 17.5* 18.4* 15.7*  CREATININE 1.68* 1.82* 1.89* 1.88* 1.74*    Estimated Creatinine Clearance: 44.8 mL/min (A) (by C-G formula based on SCr of 1.74 mg/dL (H)).    Allergies  Allergen Reactions  . Tussionex Pennkinetic Er [Hydrocod Polst-Cpm Polst Er] Other (See Comments)    UNSPECIFIED REACTION  > "caused prostate problems"  . Ace Inhibitors Cough    Antimicrobials this admission: Azithromycin 5/30-6/3 Ceftriaxone 5/30-6/5 Vancomycin 6/7 >> Cefepime 6/7 >>  Microbiology results: 5/30 BCx X 2:  NG/final 5/30 Trach aspirate: normal respiratory flora 5/30 Urine cx: NG/final 5/30 MRSA PCR: negative 5/29 COVID, flu A, flu B: negative 6/7 Trach aspirate: pending  Thank you for allowing pharmacy to be a part of this patient's care.  Gillermina Hu, PharmD, BCPS, [facility] Clinical Pharmacist 05/17/2021 6:43 PM

## 2021-05-17 NOTE — Progress Notes (Signed)
  Amiodarone Drug - Drug Interaction Consult Note  Recommendations: None, monitor for now.  Amiodarone is metabolized by the cytochrome P450 system and therefore has the potential to cause many drug interactions. Amiodarone has an average plasma half-life of 50 days (range 20 to 100 days).   There is potential for drug interactions to occur several weeks or months after stopping treatment and the onset of drug interactions may be slow after initiating amiodarone.   []  Statins: Increased risk of myopathy. Simvastatin- restrict dose to 20mg  daily. Other statins: counsel patients to report any muscle pain or weakness immediately.  []  Anticoagulants: Amiodarone can increase anticoagulant effect. Consider warfarin dose reduction. Patients should be monitored closely and the dose of anticoagulant altered accordingly, remembering that amiodarone levels take several weeks to stabilize.  []  Antiepileptics: Amiodarone can increase plasma concentration of phenytoin, the dose should be reduced. Note that small changes in phenytoin dose can result in large changes in levels. Monitor patient and counsel on signs of toxicity.  []  Beta blockers: increased risk of bradycardia, AV block and myocardial depression. Sotalol - avoid concomitant use.  []   Calcium channel blockers (diltiazem and verapamil): increased risk of bradycardia, AV block and myocardial depression.  []   Cyclosporine: Amiodarone increases levels of cyclosporine. Reduced dose of cyclosporine is recommended.  []  Digoxin dose should be halved when amiodarone is started.  [x]  Diuretics: increased risk of cardiotoxicity if hypokalemia occurs.  []  Oral hypoglycemic agents (glyburide, glipizide, glimepiride): increased risk of hypoglycemia. Patient's glucose levels should be monitored closely when initiating amiodarone therapy.   []  Drugs that prolong the QT interval:  Torsades de pointes risk may be increased with concurrent use - avoid if  possible.  Monitor QTc, also keep magnesium/potassium WNL if concurrent therapy can't be avoided. Marland Kitchen Antibiotics: e.g. fluoroquinolones, erythromycin. . Antiarrhythmics: e.g. quinidine, procainamide, disopyramide, sotalol. . Antipsychotics: e.g. phenothiazines, haloperidol.  . Lithium, tricyclic antidepressants, and methadone. Thank You,  [name]  05/17/2021 5:43 PM

## 2021-05-17 NOTE — Progress Notes (Signed)
Nutrition Follow-up  DOCUMENTATION CODES:   Obesity unspecified  INTERVENTION:   Tube Feeding via OG:  Vital 1.5 at 55 ml/hr Pro-Source TF 90 mL TID Provides 2200 kcals, 155 g of protein and 1003 mL of free water  Total free water from TF plus current free water flush of 300 mL q 4 hours: 2803 mL  Last BM on 6/2; recommend addition of scheduled bowel regimen. Currently only has prn orders.    NUTRITION DIAGNOSIS:   Inadequate oral intake related to acute illness as evidenced by NPO status.  Being addressed via TF   GOAL:   Patient will meet greater than or equal to 90% of their needs  Progressing  MONITOR:   Vent status,TF tolerance,Labs,Weight trends  REASON FOR ASSESSMENT:   Consult,Ventilator Enteral/tube feeding initiation and management  ASSESSMENT:   75 yo male admitted with acute respiratory failure, CHF, hx of lung cancer with 3 recurrences, AKI on CKD. PMH includes CHF, DM, CAD/CABG, HLD, COPD, GERD, PVD, GERD, hiatal hernia   Pt remains on vent support, failing SBTs  Vital 1.5 at 45 ml/hr via OG, free water 300 mL q 4 hours, Pro-source TF 90 mL TID  New DTI to buttocks  Hypernatremia improving; receiving free water flushes  Weight down to 119.8 kg; net negative 8.5 L  Labs: CBGs 175-276, sodium 145 (wdl-improved) Meds: ss novolog, novolog q 4 hours, lantus, lasix  Diet Order:   Diet Order            Diet NPO time specified  Diet effective now                 EDUCATION NEEDS:   Not appropriate for education at this time  Skin:  Skin Assessment: Reviewed RN Assessment  Last BM:  6/2  Height:   Ht Readings from Last 1 Encounters:  05/09/21 5\' 7"  (1.702 m)    Weight:   Wt Readings from Last 1 Encounters:  05/17/21 119.8 kg    BMI:  Body mass index is 41.37 kg/m.  Estimated Nutritional Needs:   Kcal:  2020-2345 kcals  Protein:  135-170 g  Fluid:  >/= 1.8 L   Kerman Passey MS, RDN, LDN, CNSC Registered Dietitian  III Clinical Nutrition RD Pager and On-Call Pager Number Located in [place]

## 2021-05-17 NOTE — Plan of Care (Signed)
  Problem: Education: Goal: Knowledge of General Education information will improve Description: Including pain rating scale, medication(s)/side effects and non-pharmacologic comfort measures Outcome: Progressing   Problem: Clinical Measurements: Goal: Ability to maintain clinical measurements within normal limits will improve Outcome: Progressing Goal: Will remain free from infection Outcome: Progressing Goal: Diagnostic test results will improve Outcome: Progressing Goal: Respiratory complications will improve Outcome: Progressing Goal: Cardiovascular complication will be avoided Outcome: Progressing   Problem: Activity: Goal: Risk for activity intolerance will decrease Outcome: Progressing   Problem: Nutrition: Goal: Adequate nutrition will be maintained Outcome: Progressing   Problem: Coping: Goal: Level of anxiety will decrease Outcome: Progressing   Problem: Elimination: Goal: Will not experience complications related to bowel motility Outcome: Progressing Goal: Will not experience complications related to urinary retention Outcome: Progressing   Problem: Pain Managment: Goal: General experience of comfort will improve Outcome: Progressing   Problem: Safety: Goal: Ability to remain free from injury will improve Outcome: Progressing   Problem: Skin Integrity: Goal: Risk for impaired skin integrity will decrease Outcome: Progressing   Problem: Activity: Goal: Ability to tolerate increased activity will improve Outcome: Progressing   Problem: Respiratory: Goal: Ability to maintain a clear airway and adequate ventilation will improve Outcome: Progressing   Problem: Role Relationship: Goal: Method of communication will improve Outcome: Progressing

## 2021-05-17 NOTE — Evaluation (Signed)
Occupational Therapy Evaluation Patient Details Name: [name] MRN: [id number] DOB: [birth year] Today's Date: 05/17/2021    History of Present Illness 75 yo admitted 5/30 with acute respiratory distress requiring intubation. PMhx: Morbid obesity, HFrEF, VT on amio/ ICD, CAD s/p CABG 1996, LBBB, HLD, COPD, OSA on CPAP, AAA (s/p repair 2015), NSCL lung CA s/p XRT 2018, anemia, BPH, DMT2, GERD, hiatal hernia, PVD, tremor   Clinical Impression   Pt presents with diagnoses above and deficits in strength, endurance, cognition, balance and cardiopulmonary tolerance. Pt notably lethargic this afternoon with inconsistent following of commands for activities. When asked if he was "worn out", pt nods head "yes". Guided pt in AAROM UE exercises and oral suction swab at Total A with inconsistent participation due to lethargy. Plan to progress tolerance to sitting balance activities and progress UB strength as appropriate.  PRVC 30% FiO2, SpO2 93%. HR 94 bpm BP prior to activity: 144/58 BP after activity: 130/56    Follow Up Recommendations  LTACH;Supervision/Assistance - 24 hour    Equipment Recommendations  Hospital bed    Recommendations for Other Services       Precautions / Restrictions Precautions Precautions: Fall;Other (comment) Precaution Comments: ETT, cortrak, vent Restrictions Weight Bearing Restrictions: No      Mobility Bed Mobility Overal bed mobility: Needs Assistance Bed Mobility: Supine to Sit;Sit to Supine;Rolling Rolling: Max assist;+2 for physical assistance   Supine to sit: Total assist Sit to supine: Total assist   General bed mobility comments: Session performed bed level, would need +2 assist for mobility attempts    Transfers                 General transfer comment: unable    Balance   Sitting-balance support: Bilateral upper extremity supported Sitting balance-Leahy Scale: Zero Sitting balance - Comments: in chair position pt able to pull  forward to unsupported sitting but with left lean and kyphotic posture sitting with max assist                                   ADL either performed or assessed with clinical judgement   ADL Overall ADL's : Needs assistance/impaired Eating/Feeding: NPO   Grooming: Total assistance;Bed level;Oral care Grooming Details (indicate cue type and reason): Oral care with RN present. Hand over hand assist to hold oral suction swab and bring to mouth, unable to actively use swab                               General ADL Comments: Total A for all ADLs at this time, bed level     Vision Patient Visual Report: Other (comment) (to be further assessed) Vision Assessment?: No apparent visual deficits     Perception     Praxis      Pertinent Vitals/Pain Pain Assessment: No/denies pain     Hand Dominance Right   Extremity/Trunk Assessment Upper Extremity Assessment Upper Extremity Assessment: Difficult to assess due to impaired cognition   Lower Extremity Assessment Lower Extremity Assessment: Defer to PT evaluation   Cervical / Trunk Assessment Cervical / Trunk Assessment: Kyphotic   Communication Communication Communication: Other (comment) (ETT)   Cognition Arousal/Alertness: Awake/alert;Lethargic Behavior During Therapy: Flat affect Overall Cognitive Status: Difficult to assess Area of Impairment: Attention;Following commands  Current Attention Level: Sustained   Following Commands: Follows one step commands with increased time;Follows one step commands inconsistently       General Comments: inconsistent following of commands, could be due to lethargy and fatigue as pt did nod "yes" to being worn out (and on breathing trial since this AM). Pt with slow responses and when asked to lift R UE, would like L UE at times.   General Comments  RN present, noted lethargy could be due to on breathing trial since AM.    Exercises  Exercises: General Upper Extremity General Exercises - Upper Extremity Shoulder Flexion: AAROM;Both;Seated;5 reps Elbow Flexion: AAROM;Seated;Both;5 reps Elbow Extension: AAROM;Seated;Both;5 reps General Exercises - Lower Extremity Long Arc Quad: AAROM;Right;Seated;10 reps Hip Flexion/Marching: AAROM;Both;Seated;10 reps   Shoulder Instructions      Home Living Family/patient expects to be discharged to:: Private residence Living Arrangements: Spouse/significant other Available Help at Discharge: Family;Available 24 hours/day Type of Home: House       Home Layout: One level                          Prior Functioning/Environment Level of Independence: Independent        Comments: Pt reports on PT eval Independent in daily tasks via head nods        OT Problem List: Decreased strength;Decreased activity tolerance;Impaired balance (sitting and/or standing);Decreased coordination;Decreased cognition;Cardiopulmonary status limiting activity;Impaired UE functional use      OT Treatment/Interventions: Self-care/ADL training;Therapeutic exercise;Energy conservation;DME and/or AE instruction;Therapeutic activities;Balance training;Patient/family education;Cognitive remediation/compensation    OT Goals(Current goals can be found in the care plan section) Acute Rehab OT Goals Patient Stated Goal: unable to state OT Goal Formulation: Patient unable to participate in goal setting Time For Goal Achievement: 05/31/21 Potential to Achieve Goals: Fair ADL Goals Pt Will Perform Grooming: with mod assist;bed level Pt/caregiver will Perform Home Exercise Program: With minimal assist;Increased strength;Both right and left upper extremity;With written HEP provided Additional ADL Goal #1: Pt to demo ability to follow one step commands > 50% of the time to increase ADL participation Additional ADL Goal #2: Pt to increase unsupported sitting balance to Mod A during functional tasks.   OT Frequency: Min 2X/week   Barriers to D/C:            Co-evaluation              AM-PAC OT "6 Clicks" Daily Activity     Outcome Measure Help from another person eating meals?: Total Help from another person taking care of personal grooming?: Total Help from another person toileting, which includes using toliet, bedpan, or urinal?: Total Help from another person bathing (including washing, rinsing, drying)?: Total Help from another person to put on and taking off regular upper body clothing?: Total Help from another person to put on and taking off regular lower body clothing?: Total 6 Click Score: 6   End of Session Equipment Utilized During Treatment: Other (comment) (vent) Nurse Communication: Mobility status;Other (comment) (RN present during session)  Activity Tolerance: Patient limited by fatigue;Patient limited by lethargy Patient left: in bed;with call bell/phone within reach  OT Visit Diagnosis: Other abnormalities of gait and mobility (R26.89);Muscle weakness (generalized) (M62.81);Other symptoms and signs involving cognitive function                Time: 1325-1345 OT Time Calculation (min): 20 min Charges:  OT Evaluation $OT Eval High Complexity: 1 High  Malachy Chamber, OTR/L Acute Rehab Services Office:  [place] 05/17/2021, 2:02 PM

## 2021-05-17 NOTE — Progress Notes (Signed)
Physical Therapy Treatment Patient Details Name: [name] MRN: [id number] DOB: 11-Sep-1946 Today's Date: 05/17/2021    History of Present Illness 75 yo admitted 5/30 with acute respiratory distress requiring intubation. PMhx: Morbid obesity, HFrEF, VT on amio/ ICD, CAD s/p CABG 1996, LBBB, HLD, COPD, OSA on CPAP, AAA (s/p repair 2015), NSCL lung CA s/p XRT 2018, anemia, BPH, DMT2, GERD, hiatal hernia, PVD, tremor    PT Comments    Pt with maintained left lateral neck rotation on arrival toward vent with and assisted rotation 15 degrees to right pt with noted horizontal nystagmus maintained grossly 3 min without rotation. Pt reports no history of vertigo but today shaking his head indicating that he was not walking at baseline but not family present to clarify. Pt remains able to move all extremities with increased weakness of left hip flexors with continued left lateral lean and slumped posture in sitting with pt able to pull forward but decreased core activation. Pt sat EOB in chair position 8 min. Will continue to follow.   30% FiO2 on CPAP, SpO2 98% HR 79-85 Bp  Pre activity 112/42 (65), post 119/72 (83)   Follow Up Recommendations  LTACH     Equipment Recommendations  Other (comment) (TBD)    Recommendations for Other Services       Precautions / Restrictions Precautions Precautions: Fall;Other (comment) Precaution Comments: ETT, cortrak, vent Restrictions Weight Bearing Restrictions: No    Mobility  Bed Mobility Overal bed mobility: Needs Assistance Bed Mobility: Supine to Sit;Sit to Supine;Rolling Rolling: Max assist;+2 for physical assistance   Supine to sit: Total assist Sit to supine: Total assist   General bed mobility comments: total assist of bed with foot egress positioning to transition from supine<>sitting. Total +2 to slide toward HOB. Max assist for sitting balance with posterior left lean. Max +2 to roll    Transfers                  General transfer comment: not yet ready  Ambulation/Gait                 Stairs             Wheelchair Mobility    Modified Rankin (Stroke Patients Only)       Balance   Sitting-balance support: Bilateral upper extremity supported Sitting balance-Leahy Scale: Zero Sitting balance - Comments: in chair position pt able to pull forward to unsupported sitting but with left lean and kyphotic posture sitting with max assist                                    Cognition Arousal/Alertness: Awake/alert Behavior During Therapy: Flat affect Overall Cognitive Status: Impaired/Different from baseline Area of Impairment: Attention;Following commands                   Current Attention Level: Sustained   Following Commands: Follows one step commands with increased time;Follows one step commands inconsistently              Exercises General Exercises - Lower Extremity Long Arc Quad: AAROM;Right;Seated;10 reps Hip Flexion/Marching: AAROM;Both;Seated;10 reps    General Comments        Pertinent Vitals/Pain Pain Assessment: No/denies pain    Home Living                      Prior Function  PT Goals (current goals can now be found in the care plan section) Progress towards PT goals: Progressing toward goals    Frequency    Min 3X/week      PT Plan Current plan remains appropriate    Co-evaluation              AM-PAC PT "6 Clicks" Mobility   Outcome Measure  Help needed turning from your back to your side while in a flat bed without using bedrails?: Total Help needed moving from lying on your back to sitting on the side of a flat bed without using bedrails?: Total Help needed moving to and from a bed to a chair (including a wheelchair)?: Total Help needed standing up from a chair using your arms (e.g., wheelchair or bedside chair)?: Total Help needed to walk in hospital room?: Total Help needed  climbing 3-5 steps with a railing? : Total 6 Click Score: 6    End of Session   Activity Tolerance: Patient tolerated treatment well Patient left: in bed;with call bell/phone within reach;with bed alarm set Nurse Communication: Mobility status;Need for lift equipment PT Visit Diagnosis: Other abnormalities of gait and mobility (R26.89);Difficulty in walking, not elsewhere classified (R26.2);Muscle weakness (generalized) (M62.81)     Time: 4332-9518 PT Time Calculation (min) (ACUTE ONLY): 26 min  Charges:  $Therapeutic Exercise: 8-22 mins $Therapeutic Activity: 8-22 mins                     Artie Takayama P, PT Acute Rehabilitation Services Pager: [phone] Office: [facility] Desirai Traxler 05/17/2021, 10:10 AM

## 2021-05-18 ENCOUNTER — Inpatient Hospital Stay (HOSPITAL_COMMUNITY): Payer: Medicare Other

## 2021-05-18 DIAGNOSIS — I4891 Unspecified atrial fibrillation: Secondary | ICD-10-CM

## 2021-05-18 DIAGNOSIS — R579 Shock, unspecified: Secondary | ICD-10-CM

## 2021-05-18 LAB — COOXEMETRY PANEL
Carboxyhemoglobin: 0.7 % (ref 0.5–1.5)
Methemoglobin: 0.8 % (ref 0.0–1.5)
O2 Saturation: 68 %
Total hemoglobin: 15.9 g/dL (ref 12.0–16.0)

## 2021-05-18 LAB — CBC
HCT: 42.6 % (ref 39.0–52.0)
Hemoglobin: 12.9 g/dL — ABNORMAL LOW (ref 13.0–17.0)
MCH: 31.2 pg (ref 26.0–34.0)
MCHC: 30.3 g/dL (ref 30.0–36.0)
MCV: 102.9 fL — ABNORMAL HIGH (ref 80.0–100.0)
Platelets: 108 10*3/uL — ABNORMAL LOW (ref 150–400)
RBC: 4.14 MIL/uL — ABNORMAL LOW (ref 4.22–5.81)
RDW: 13.3 % (ref 11.5–15.5)
WBC: 15.4 10*3/uL — ABNORMAL HIGH (ref 4.0–10.5)
nRBC: 0 % (ref 0.0–0.2)

## 2021-05-18 LAB — BASIC METABOLIC PANEL
Anion gap: 11 (ref 5–15)
BUN: 79 mg/dL — ABNORMAL HIGH (ref 8–23)
CO2: 23 mmol/L (ref 22–32)
Calcium: 8.5 mg/dL — ABNORMAL LOW (ref 8.9–10.3)
Chloride: 112 mmol/L — ABNORMAL HIGH (ref 98–111)
Creatinine, Ser: 1.63 mg/dL — ABNORMAL HIGH (ref 0.61–1.24)
GFR, Estimated: 43 mL/min — ABNORMAL LOW (ref >60)
Glucose, Bld: 241 mg/dL — ABNORMAL HIGH (ref 70–99)
Potassium: 4.7 mmol/L (ref 3.5–5.1)
Sodium: 146 mmol/L — ABNORMAL HIGH (ref 135–145)

## 2021-05-18 LAB — GLUCOSE, CAPILLARY
Glucose-Capillary: 181 mg/dL — ABNORMAL HIGH (ref 70–99)
Glucose-Capillary: 185 mg/dL — ABNORMAL HIGH (ref 70–99)
Glucose-Capillary: 244 mg/dL — ABNORMAL HIGH (ref 70–99)
Glucose-Capillary: 251 mg/dL — ABNORMAL HIGH (ref 70–99)
Glucose-Capillary: 279 mg/dL — ABNORMAL HIGH (ref 70–99)
Glucose-Capillary: 283 mg/dL — ABNORMAL HIGH (ref 70–99)
Glucose-Capillary: 293 mg/dL — ABNORMAL HIGH (ref 70–99)

## 2021-05-18 LAB — PHOSPHORUS: Phosphorus: 4 mg/dL (ref 2.5–4.6)

## 2021-05-18 LAB — MAGNESIUM: Magnesium: 2.7 mg/dL — ABNORMAL HIGH (ref 1.7–2.4)

## 2021-05-18 LAB — HEPARIN LEVEL (UNFRACTIONATED): Heparin Unfractionated: 0.39 IU/mL (ref 0.30–0.70)

## 2021-05-18 LAB — PROCALCITONIN: Procalcitonin: 0.13 ng/mL

## 2021-05-18 MED ORDER — INSULIN ASPART 100 UNIT/ML IJ SOLN
10.0000 [IU] | Freq: Once | INTRAMUSCULAR | Status: AC
  Administered 2021-05-18: 10 [IU] via SUBCUTANEOUS

## 2021-05-18 MED ORDER — INSULIN GLARGINE 100 UNIT/ML ~~LOC~~ SOLN
45.0000 [IU] | Freq: Two times a day (BID) | SUBCUTANEOUS | Status: DC
  Administered 2021-05-18 – 2021-05-19 (×2): 45 [IU] via SUBCUTANEOUS
  Filled 2021-05-18 (×4): qty 0.45

## 2021-05-18 MED ORDER — INSULIN ASPART 100 UNIT/ML IJ SOLN
11.0000 [IU] | INTRAMUSCULAR | Status: DC
  Administered 2021-05-18 – 2021-05-19 (×5): 11 [IU] via SUBCUTANEOUS

## 2021-05-18 MED ORDER — SODIUM CHLORIDE 0.9 % IV SOLN: INTRAVENOUS | Status: DC | PRN

## 2021-05-18 MED ORDER — PHENYLEPHRINE HCL-NACL 10-0.9 MG/250ML-% IV SOLN
0.0000 ug/min | INTRAVENOUS | Status: DC
Start: 2021-05-18 — End: 2021-05-21
  Administered 2021-05-18: 30 ug/min via INTRAVENOUS
  Administered 2021-05-18: 20 ug/min via INTRAVENOUS
  Administered 2021-05-18: 25 ug/min via INTRAVENOUS
  Administered 2021-05-19: 20 ug/min via INTRAVENOUS
  Filled 2021-05-18 (×4): qty 250

## 2021-05-18 MED ORDER — BISACODYL 10 MG RE SUPP
10.0000 mg | Freq: Once | RECTAL | Status: AC
  Administered 2021-05-18: 10 mg via RECTAL
  Filled 2021-05-18: qty 1

## 2021-05-18 NOTE — Procedures (Signed)
Arterial Catheter Insertion Procedure Note  [name]  [id number]  [birth year]  Date:05/18/21  Time:11:36 AM    Provider Performing: Corey Harold    Procedure: Insertion of Arterial Line [phone]) with US guidance ([id number])   Indication(s) Blood pressure monitoring and/or need for frequent ABGs  Consent Risks of the procedure as well as the alternatives and risks of each were explained to the patient and/or caregiver.  Consent for the procedure was obtained and is signed in the bedside chart  Anesthesia None   Time Out Verified patient identification, verified procedure, site/side was marked, verified correct patient position, special equipment/implants available, medications/allergies/relevant history reviewed, required imaging and test results available.   Sterile Technique Maximal sterile technique including full sterile barrier drape, hand hygiene, sterile gown, sterile gloves, mask, hair covering, sterile ultrasound probe cover (if used).   Procedure Description Area of catheter insertion was cleaned with chlorhexidine and draped in sterile fashion. With real-time ultrasound guidance an arterial catheter was placed into the left radial artery.  Appropriate arterial tracings confirmed on monitor.     Complications/Tolerance None; patient tolerated the procedure well.   EBL Minimal   Specimen(s) None   Georgann Housekeeper, AGACNP-BC [facility]  See Amion for personal pager PCCM on call pager [phone] until 7pm. Please call Elink 7p-7a. [phone]  05/18/2021 11:37 AM

## 2021-05-18 NOTE — Progress Notes (Signed)
[place] for argatroban> heparin  Indication: afib  Allergies  Allergen Reactions  . Tussionex Pennkinetic Er [Hydrocod Polst-Cpm Polst Er] Other (See Comments)    UNSPECIFIED REACTION  > "caused prostate problems"  . Ace Inhibitors Cough    Patient Measurements: Height: 5\' 7"  (170.2 cm) Weight: 118.9 kg (262 lb 2 oz) IBW/kg (Calculated) : 66.1 Heparin Dosing Weight: 125kg  Vital Signs: Temp: 100.7 F (38.2 C) (06/08 0757) Temp Source: Oral (06/08 0757) BP: 91/43 (06/08 0822) Pulse Rate: 121 (06/08 0822)  Labs: Recent Labs    05/16/21 0340 05/17/21 0210 05/17/21 1803 05/18/21 0424 05/18/21 0735  HGB 13.8 13.5  --  12.9*  --   HCT 44.3 43.9  --  42.6  --   PLT 120* 106*  --  108*  --   HEPARINUNFRC 0.41 0.39  --  0.39  --   CREATININE 1.88* 1.74* 1.53*  --  1.63*    Estimated Creatinine Clearance: 47.6 mL/min (A) (by C-G formula based on SCr of 1.63 mg/dL (H)).  Assessment: 75 yo male with h/o Afib, Xarelto on hold, new thrombocytopenia on heparin and concern for HIT so was switched to argatroban. Heparin antibody was negative, so switched back to heparin on 05/14/21.  Heparin level (0.39) therapeutic, on drip rate 1300 units/hr. Hgb wnl 12.9, pltc 108    Goal of Therapy:  aPTT 50-90 seconds Monitor platelets by anticoagulation protocol: Yes   Plan:  Continue Heparin drip 1300 uts/hr  Daily heparin level and CBC   Hildred Laser, PharmD Clinical Pharmacist **Pharmacist phone directory can now be found on [email or web address] ([email or web address]).  Listed under [place].

## 2021-05-18 NOTE — Progress Notes (Signed)
eLink Physician-Brief Progress Note Patient Name: [name] DOB: [birth year] MRN: [id number]   Date of Service  05/18/2021  HPI/Events of Note  Hypotension  eICU Interventions  Phenylephrine ordered. Norepinephrine discontinued due to Afib with RVR.        Kerry Kass Bernita Beckstrom 05/18/2021, 3:59 AM

## 2021-05-18 NOTE — Progress Notes (Signed)
NAME:  [name], MRN:  [id number], DOB:  01-23-1946, LOS: 9 ADMISSION DATE:  04/27/2021, CONSULTATION DATE:  05/09/2021 REFERRING MD:  Dr. Randal Buba, CHIEF COMPLAINT:  SOB  History of Present Illness:  [name] is a 75 yo M with past medical hx as below presenting from home with complaints of SOB/ respiratory distress on 5/29.  Treated by EMS with duonebs, epi, solumedrol, and mag without improvement, requiring BVM.  On arrival to ER, patient required intubation as he was peri- arrest, unresponsive, bradycardic, temp 95.9, hypotensive, and apneic with diffuse apical wheeze requiring emergent intubation on arrival.  Noted to have pink frothy secretions on intubation.  PCCM called for admit.   Pertinent  Medical History  Morbid obesity, HFrEF (12/24/20 EF 40-45%, G2DD, PAF, mildly reduced systolic RV, mild MR), VT on amio/ ICD, CAD s/p CABG 1996, LBBB, HLD, COPD, OSA on CPAP, AAA (s/p repair 2015), NSCL lung CA s/p XRT 2018, anemia, BPH, DMT2, GERD, hiatal hernia, PVD, tremor  Significant Hospital Events: Including procedures, antibiotic start and stop dates in addition to other pertinent events   . 5/30 admitted for respiratory failure/ intubated, empiric CAP coverage/ duresis . 6/1 BLLE Duplex> no DVT, unable to complete thoracentesis due to anatomy  . 6/2 Tmax 101, -2.9L in last 24 hours, on levo at 13mcg, precedex. Resp culture negative. BC pending. . 6/3 switched from heparin to argatroban for thrombocytopenia, HIT ab pending . 6/4 HIT AB negative, switched back to heparin, failed SBT due to tachypnea . 6/7 afib w/ RVR, HR increased abruptly to 140s from 90s, received amio bolus and started on drip, got fentanyl and versed, started cefepime and vanco, d/c norepi, receiving vasopressin, precedex, phenylephrine  Interim History / Subjective:  Febrile to 101.4 ON, no additional events.    Objective   Blood pressure (!) 91/43, pulse (!) 121, temperature (!) 100.7 F (38.2 C), temperature  source Oral, resp. rate (!) 25, height 5\' 7"  (1.702 m), weight 118.9 kg, SpO2 99 %. CVP:  [5 mmHg-28 mmHg] 7 mmHg  Vent Mode: PRVC FiO2 (%):  [30 %-40 %] 40 % Set Rate:  [24 bmp] 24 bmp Vt Set:  [530 mL] 530 mL PEEP:  [5 cmH20] 5 cmH20 Pressure Support:  [8 cmH20] 8 cmH20 Plateau Pressure:  [16 cmH20-19 cmH20] 16 cmH20   Intake/Output Summary (Last 24 hours) at 05/18/2021 1022 Last data filed at 05/18/2021 1000 Gross per 24 hour  Intake 2234.39 ml  Output 4230 ml  Net -1995.61 ml   Net for admission -7.9 L  Filed Weights   05/16/21 0406 05/17/21 0228 05/18/21 0600  Weight: 120.1 kg 119.8 kg 118.9 kg    Examination: General: critically ill-appearing man lying in bed in NAD, eyes closes  HEENT: [place]/AT, ETT in place Neuro: opens eyes to sternal rub, moves hands on command, does not move lower extremities CV: S1S2, RRR PULM: normal WOB, no rhonchi Skin: no rashes or ecchymoses, scattered bruising on upper extremities bilaterally   Labs/imaging that I havepersonally reviewed  (right click and "Reselect all SmartList Selections" daily)   Glucose range overnight 176-244   Labs 6/8:  Na 146 BUN  79 Cr 1.63 WBC 15.5 Platelets 108 Ca 8.5 Cl 112 Phos 4.0  Procalcitonin 0.13  coox 72% on 6/4  Echo 6/2> LVEF 20-25%, severely decreased function, global hypokinesis. Mild concentric LVH. Normal RV size and function. Mild MR. Mild to moderate aortic sclerosis. IVC with reduced respiratory variability.   Resolved Hospital Problem list  Assessment & Plan:     Acute hypoxic/ hypercarbic respiratory failure with sL>R multifocal infiltrates c/w CAP Hx of lung cancer with 3 recurrences with baseline abnormal CXR COPD with questionable acute exacerbation - Failed SBTs due to WOB and agitation since 5/31-  - S/p 7 day course antibiotics: Azithromycin (5/29-6/2), Ceftriaxone (5/29-6/5)  - Fever, afib w/ rvr, persistently elevated WBC c/f for new infection, pneumonia though  pro-calcitonin is not elevated which is less consistent with this etiology For suspected infection:  - Continue cefepime and vancomycin, may d/c vanco pending MRSA screen  - Monitor results of 6/7 tracheal aspirate  - Consider further infectious work-up if fevers continue, tracheal aspirate  nonrevealing For respiratory status:  - Con't lasix 40 mg BID today  - LTVV, 4-8cc/kg IBW with goal Pplat<30 and DP<15  - VAP prevention protocol  - PAD protocol for sedation  - Daily SAT & SBT as appropriate  Shock- suspected septic with pneumonia as source, complicated by poor cardiac function. Not requiring pressors when off sedation. - S/p 7 day course antibiotics for CAP: Azithromycin (5/29-6/2), Ceftriaxone (5/29-6/5) - Continue abx as above - Continue vasopressors (vasopressin, precedex, phenylephrine) as required to maintain MAP >65 - Place a-line, coox 6/8 - Euvolemic on exam w/ slightly decreased weight - con't lasix 40 mg BID  Deconditioning  - Anticipate prolonged recovery  - Appreciate PT & OT's assistance   HFrEF (12/24/20 EF 40-45%, G2DD, mildly reduced systolic RV, mild MR) >> LVEF down to 20-25% this admission Hx of VT on amio w/ICD Hx CAD s/p CABG 1996  Hx LBBB HTN PAF - Diuresis as above - Levophed as needed for MAP >65 (doing well off pressors yesterday; on pressor overnight) - Con't heparin; keep watching platelets. HIT Ab negative - Con't enteral amiodarone, amiodarone drip - Con't Atorvastatin - Hold home entresto, spironolactone, torsemide, imdur, carvedilol given shock - Tele monitoring  - Maintain appropriate electrolytes - Repeat Mag today  AKI on CKD 3b BPHon PTA finasteride Phimosis - required bedside dilation by urology for foley placement 5/30 - More likely advanced CKD unmasked by diuresis as he is now euvolemic  - Maintain adequate renal perfusion; doing well off vasopressors  - Diuresis - Con't free water  - Strict I/OS - Renally dose meds, avoid  nephrotoxic meds - Con't to monitor renal function - Continue bethanechol - Foley needs to remain in place - Per urology may need OP follow up for surgery to address phimosis; unfortunately with ongoing AKI and phimosis still needs foley  Leukocytosis- persistent. Last fever 6/8. - S/p 7-day course of antibiotics (5/29-6/5); now on cefepime and vancomycin (6/7->) - Con't to monitor  Thrombocytopenia 4T score= 4, intermediate risk for HIT, although B- lactams can also decrease platelets. HIT Ab negative - Plts stable today - If platelets drop further, can check SRA - Con't heparin  Hyperglycemia, uncontrolled - Hx T2DM, A1C 5.9 05/09/21 - BGs still in 200s following insulin adjustment yesterday - Goal BG 140-180 - Continue with current dosages for today, may re-adjust tomorrow  - Increase NovoLOG from 9 to 11 units Q4 with hold parameters   - Con't Lantus from 35 units BID    - SSI, moderate scale   Constipation - Last BM 6/2, scheduled miralax and colace 6/7 - Bisacodyl suppository 6/8  Hypernatremia, improving   - Con't FWF  - Con't monitoring   Hypothyroidism, stable - Con't synthroid  Class 3 obesity, stable - Education when able   Baseline anxiety disorder -  Klonopin BID    No family present at bedside during rounds today.  Best practice (right click and "Reselect all SmartList Selections" daily)  Diet:  TF Pain/Anxiety/Delirium protocol (if indicated): Yes (RASS goal 0) VAP protocol (if indicated): Yes DVT prophylaxis: Systemic AC GI prophylaxis: PPI Glucose control:  Basal bolus, SSI Central venous access:  Yes, and it is still needed Arterial line:  no Foley:  Yes, and it is still needed Mobility:  bed rest  PT consulted: [name] Last date of multidisciplinary goals of care discussion:  Code Status:  full code Disposition: ICU  This patient is critically ill with multiple organ system failure which requires frequent high complexity decision making,  assessment, support, evaluation, and titration of therapies. This was completed through the application of advanced monitoring technologies and extensive interpretation of multiple databases. During this encounter critical care time was devoted to patient care services described in this note for 60 minutes.  Margot Chimes, medical student 05/18/21 10:22 AM [facility]  For contact information, see Amion. If no response to pager, please call PCCM consult pager. After hours, 7PM- 7AM, please call Elink.

## 2021-05-19 DIAGNOSIS — R6521 Severe sepsis with septic shock: Secondary | ICD-10-CM

## 2021-05-19 DIAGNOSIS — A419 Sepsis, unspecified organism: Secondary | ICD-10-CM

## 2021-05-19 LAB — CBC
HCT: 42.9 % (ref 39.0–52.0)
Hemoglobin: 13.2 g/dL (ref 13.0–17.0)
MCH: 31.2 pg (ref 26.0–34.0)
MCHC: 30.8 g/dL (ref 30.0–36.0)
MCV: 101.4 fL — ABNORMAL HIGH (ref 80.0–100.0)
Platelets: 145 10*3/uL — ABNORMAL LOW (ref 150–400)
RBC: 4.23 MIL/uL (ref 4.22–5.81)
RDW: 13.8 % (ref 11.5–15.5)
WBC: 17.6 10*3/uL — ABNORMAL HIGH (ref 4.0–10.5)
nRBC: 0 % (ref 0.0–0.2)

## 2021-05-19 LAB — COMPREHENSIVE METABOLIC PANEL
ALT: 37 U/L (ref 0–44)
AST: 38 U/L (ref 15–41)
Albumin: 2.6 g/dL — ABNORMAL LOW (ref 3.5–5.0)
Alkaline Phosphatase: 35 U/L — ABNORMAL LOW (ref 38–126)
Anion gap: 12 (ref 5–15)
BUN: 60 mg/dL — ABNORMAL HIGH (ref 8–23)
CO2: 18 mmol/L — ABNORMAL LOW (ref 22–32)
Calcium: 8.2 mg/dL — ABNORMAL LOW (ref 8.9–10.3)
Chloride: 114 mmol/L — ABNORMAL HIGH (ref 98–111)
Creatinine, Ser: 1.64 mg/dL — ABNORMAL HIGH (ref 0.61–1.24)
GFR, Estimated: 43 mL/min — ABNORMAL LOW (ref >60)
Glucose, Bld: 302 mg/dL — ABNORMAL HIGH (ref 70–99)
Potassium: 4.5 mmol/L (ref 3.5–5.1)
Sodium: 144 mmol/L (ref 135–145)
Total Bilirubin: 0.7 mg/dL (ref 0.3–1.2)
Total Protein: 5.9 g/dL — ABNORMAL LOW (ref 6.5–8.1)

## 2021-05-19 LAB — CULTURE, RESPIRATORY W GRAM STAIN
Culture: NORMAL
Gram Stain: NONE SEEN

## 2021-05-19 LAB — GLUCOSE, CAPILLARY
Glucose-Capillary: 238 mg/dL — ABNORMAL HIGH (ref 70–99)
Glucose-Capillary: 255 mg/dL — ABNORMAL HIGH (ref 70–99)
Glucose-Capillary: 301 mg/dL — ABNORMAL HIGH (ref 70–99)
Glucose-Capillary: 319 mg/dL — ABNORMAL HIGH (ref 70–99)
Glucose-Capillary: 305 mg/dL — ABNORMAL HIGH (ref 70–99)
Glucose-Capillary: 266 mg/dL — ABNORMAL HIGH (ref 70–99)
Glucose-Capillary: 219 mg/dL — ABNORMAL HIGH (ref 70–99)
Glucose-Capillary: 224 mg/dL — ABNORMAL HIGH (ref 70–99)
Glucose-Capillary: 201 mg/dL — ABNORMAL HIGH (ref 70–99)
Glucose-Capillary: 180 mg/dL — ABNORMAL HIGH (ref 70–99)
Glucose-Capillary: 182 mg/dL — ABNORMAL HIGH (ref 70–99)
Glucose-Capillary: 196 mg/dL — ABNORMAL HIGH (ref 70–99)

## 2021-05-19 LAB — PROCALCITONIN: Procalcitonin: 0.21 ng/mL

## 2021-05-19 LAB — HEPARIN LEVEL (UNFRACTIONATED): Heparin Unfractionated: 0.55 IU/mL (ref 0.30–0.70)

## 2021-05-19 MED ORDER — SODIUM CHLORIDE 0.9 % IV SOLN
1.0000 g | Freq: Two times a day (BID) | INTRAVENOUS | Status: AC
  Administered 2021-05-19 – 2021-05-21 (×6): 1 g via INTRAVENOUS
  Filled 2021-05-19 (×7): qty 1

## 2021-05-19 MED ORDER — FENTANYL BOLUS VIA INFUSION
50.0000 ug | INTRAVENOUS | Status: DC | PRN
  Filled 2021-05-19: qty 100

## 2021-05-19 MED ORDER — INSULIN ASPART 100 UNIT/ML IJ SOLN
15.0000 [IU] | INTRAMUSCULAR | Status: DC
  Administered 2021-05-19 (×2): 15 [IU] via SUBCUTANEOUS

## 2021-05-19 MED ORDER — FENTANYL CITRATE (PF) 100 MCG/2ML IJ SOLN
50.0000 ug | Freq: Once | INTRAMUSCULAR | Status: AC
  Administered 2021-05-19: 50 ug via INTRAVENOUS

## 2021-05-19 MED ORDER — ACETAMINOPHEN 160 MG/5ML PO SOLN
650.0000 mg | Freq: Four times a day (QID) | ORAL | Status: DC
  Administered 2021-05-20 – 2021-05-24 (×17): 650 mg
  Filled 2021-05-19 (×16): qty 20.3

## 2021-05-19 MED ORDER — INSULIN REGULAR(HUMAN) IN NACL 100-0.9 UT/100ML-% IV SOLN
INTRAVENOUS | Status: DC
  Administered 2021-05-19: 7.4 [IU]/h via INTRAVENOUS
  Administered 2021-05-19: 13 [IU]/h via INTRAVENOUS
  Administered 2021-05-20: 15 [IU]/h via INTRAVENOUS
  Administered 2021-05-20: 7 [IU]/h via INTRAVENOUS
  Administered 2021-05-21: 11 [IU]/h via INTRAVENOUS
  Administered 2021-05-21: 10 [IU]/h via INTRAVENOUS
  Administered 2021-05-22: 9 [IU]/h via INTRAVENOUS
  Administered 2021-05-22: 13 [IU]/h via INTRAVENOUS
  Administered 2021-05-23: 7.5 [IU]/h via INTRAVENOUS
  Administered 2021-05-23: 18 [IU]/h via INTRAVENOUS
  Administered 2021-05-24: 10.5 [IU]/h via INTRAVENOUS
  Administered 2021-05-24: 13 [IU]/h via INTRAVENOUS
  Administered 2021-05-25: 5.5 [IU]/h via INTRAVENOUS
  Administered 2021-05-25: 9 [IU]/h via INTRAVENOUS
  Administered 2021-05-26 – 2021-05-27 (×2): 6 [IU]/h via INTRAVENOUS
  Administered 2021-05-27: 3 [IU]/h via INTRAVENOUS
  Administered 2021-05-28: 11.5 [IU]/h via INTRAVENOUS
  Administered 2021-05-28: 3.5 [IU]/h via INTRAVENOUS
  Administered 2021-05-30: 2 [IU]/h via INTRAVENOUS
  Filled 2021-05-19 (×20): qty 100

## 2021-05-19 MED ORDER — ACETAMINOPHEN 160 MG/5ML PO SOLN
650.0000 mg | Freq: Four times a day (QID) | ORAL | Status: DC
  Administered 2021-05-19 (×2): 650 mg via ORAL
  Filled 2021-05-19: qty 20.3

## 2021-05-19 MED ORDER — FENTANYL 2500MCG IN NS 250ML (10MCG/ML) PREMIX INFUSION
50.0000 ug/h | INTRAVENOUS | Status: DC
  Administered 2021-05-19 – 2021-05-22 (×2): 50 ug/h via INTRAVENOUS
  Administered 2021-05-22: 75 ug/h via INTRAVENOUS
  Filled 2021-05-19 (×3): qty 250

## 2021-05-19 MED ORDER — SODIUM CHLORIDE 0.9% FLUSH
10.0000 mL | Freq: Two times a day (BID) | INTRAVENOUS | Status: DC
  Administered 2021-05-19 – 2021-05-24 (×10): 10 mL
  Administered 2021-05-27: 20 mL
  Administered 2021-05-28 – 2021-06-01 (×9): 10 mL

## 2021-05-19 MED ORDER — POLYETHYLENE GLYCOL 3350 17 G PO PACK: 17.0000 g | PACK | Freq: Every day | ORAL | Status: DC

## 2021-05-19 MED ORDER — SODIUM CHLORIDE 0.9% FLUSH: 10.0000 mL | INTRAVENOUS | Status: DC | PRN

## 2021-05-19 MED ORDER — VANCOMYCIN HCL IN DEXTROSE 1-5 GM/200ML-% IV SOLN
1000.0000 mg | INTRAVENOUS | Status: DC
  Administered 2021-05-20: 1000 mg via INTRAVENOUS
  Filled 2021-05-19 (×2): qty 200

## 2021-05-19 MED ORDER — DEXTROSE 50 % IV SOLN
0.0000 mL | INTRAVENOUS | Status: DC | PRN
  Administered 2021-05-23: 25 mL via INTRAVENOUS
  Filled 2021-05-19: qty 50

## 2021-05-19 MED ORDER — DOCUSATE SODIUM 50 MG/5ML PO LIQD
100.0000 mg | Freq: Two times a day (BID) | ORAL | Status: DC
  Administered 2021-05-19 – 2021-05-27 (×9): 100 mg
  Filled 2021-05-19 (×12): qty 10

## 2021-05-19 MED ORDER — ACETAMINOPHEN 325 MG PO TABS: 650.0000 mg | ORAL_TABLET | Freq: Four times a day (QID) | ORAL | Status: DC

## 2021-05-19 NOTE — Consult Note (Signed)
Urology Consult   Physician requesting consult: Dr. Ina Homes.  Reason for consult: Difficult foley catheter   History of Present Illness: [name] is a 75 y.o. has a history of BPH with bladder outlet obstruction and phimosis.  He was admitted for acute respiratory failure and initial attempt of Foley catheter placement was unsuccessful on 05/09/2021.  Urology was then consulted.  Dr. Jeffie Pollock was able to place a 31 [place] coud catheter after dilating his phimosis.  Foley catheter was removed today and attempt to obtain urine culture from freshly catheterized specimen however nursing was unable to replace Foley catheter.  Past Medical History:  Diagnosis Date   AAA (abdominal aortic aneurysm) ([place])    followed by Dr. Bridgett Larsson   Adenomatous colon polyp 01/2004   Anemia    hx   Automatic implantable cardioverter-defibrillator in situ    AVM (arteriovenous malformation)    BPH (benign prostatic hypertrophy)    CAD (coronary artery disease)    s/p CABGx2 in 1996   Carotid artery stenosis    LCEA - Dr. Bridgett Larsson in 2013   CHF (congestive heart failure) (HCC)    Complication of anesthesia    claustrophobic, unabe to lie on back more than 4 hours at time due to back   COPD (chronic obstructive pulmonary disease) ([place])    Diabetes mellitus    DIET CONTROLLED- pt states that this was a misdiagnosis, he was treated while in the hospital  but returned home, loss a massive amount of weight and he has not had a problem with his blood sugar since. States everything has been normal for about 3 years.   Diverticulosis    Dyspnea    Dysrhythmia    ICD-defibrillator   Fatigue    GERD (gastroesophageal reflux disease)    H/O hiatal hernia    History of radiation therapy 04/09/17-04/17/17   SBRT right lung 54 Gy in 3 fractions   HLD (hyperlipidemia)    Hypertension    Hypothyroidism    Ischemic cardiomyopathy    Myocardial infarction ([place])    NSCL ca 2018   recurrence x 3   OSA on CPAP    AHI  durign total sleep 14.69/hr, during REM 50.91/hr   Peripheral vascular disease ([place])    LCEA, L renal artery stent, bilat iliac stents, R SFA stenosis   Tremor    Ventricular tachycardia ([place]) 09/09/2014   Amiodarone was started after appropriate defibrillator shocks for ventricular tachycardia in October 2008    Past Surgical History:  Procedure Laterality Date   ABDOMINAL AORTIC ENDOVASCULAR STENT GRAFT [name] 01/06/2014   Procedure: ABDOMINAL AORTIC ENDOVASCULAR STENT GRAFT- GORE; ULTRASOUND GUIDED;  Surgeon: Conrad Fort Meade, MD;  Location: [facility];  Service: Vascular;  Laterality: [name];   ANGIOPLASTY     BILATERAL  LE  W/STENTS   BI-VENTRICULAR IMPLANTABLE CARDIOVERTER DEFIBRILLATOR UPGRADE [name] 10/09/2014   Procedure: BI-VENTRICULAR IMPLANTABLE CARDIOVERTER DEFIBRILLATOR UPGRADE;  Surgeon: Evans Lance, MD;  Location: [facility] CATH LAB;  Service: Cardiovascular;  Laterality: [name];   BIV ICD GENERTAOR CHANGE OUT  10/09/2014   UPGRADE TO BIV        BY DR Lovena Le   CARDIAC CATHETERIZATION  09/16/2007   occlusion of both vein grafts, no significant LAD disease or diagonal disease, Cfx collaterals from the left, 70% in-stent restenosis of L renal artery, normal L main, RCA occluded ostially (Dr. Adora Fridge)   CARDIAC CATHETERIZATION  10/03/2002   SVG sequentially to OM1 & OM2 totally occluded at  ostium, SVG to PDA totally occluded within previously placed prox vein graft stent, smal distal AAA, bialt iliac stents with 30% left end-stent restenosis and 50% right end-stent restnosis(Dr. Gerrie Nordmann)   CARDIAC CATHETERIZATION  06/11/1998   L main with 20% narrowing in distal 1/3; LAD with 1st diagonla having 70% ostial narrowing, 2nd diagonal with 40% narrowing in prox third, LIMA & RIMA widely patent; in-stent restenosis of RCIA with successful PTA and new prox SVTRCA stent for residual disease (Dr. Marella Chimes)   CARDIAC DEFIBRILLATOR [place]  06/2005   Guidant Vitality HE - ischemic cardiomyopathy - Dr. Marella Chimes   CARDIOVERSION [name] 11/19/2020   Procedure: CARDIOVERSION;  Surgeon: Larey Dresser, MD;  Location: [facility] ENDOSCOPY;  Service: Cardiovascular;  Laterality: [name];   CAROTID ENDARTERECTOMY Left 01/04/12   CORONARY ANGIOPLASTY  01/08/2004   cutting balloon atherectomy & percutaneous intervention of RCIA in-stent restenosis (Dr. Marella Chimes)   [place] GRAFT  11/04/1985   x2 - PDA and sequential DX-OM (Dr. Redmond Pulling)   ENDARTERECTOMY  01/04/2012   Procedure: ENDARTERECTOMY CAROTID;left  Surgeon: Hinda Lenis, MD;  Location: [facility];  Service: Vascular;  Laterality: Left;  with patch angioplasty   FUDUCIAL PLACEMENT [name] 02/23/2017   Procedure: PLACEMENT OF FUDUCIAL;  Surgeon: [name], MD;  Location: [place];  Service: Thoracic;  Laterality: [name];   ICD GENERATOR CHANGE  05/02/2010   [employer] Teligen   ILIAC ARTERY STENT Bilateral 03/1997   and L SFA PTA   Iron infusion  June 16, 2012   LEFT HEART CATHETERIZATION WITH CORONARY ANGIOGRAM [name] 07/06/2014   Procedure: LEFT HEART CATHETERIZATION WITH CORONARY ANGIOGRAM;  Surgeon: Peter M Martinique, MD;  Location: [facility] C[facility] LAB;  Service: Cardiovascular;  Laterality: [name];   NM MYOCAR PERF WALL MOTION  09/2012   lexiscan myoview - mod-severe perfusion defect r/t infarct or scar w/mild periinfarct ishcemia in basal inferior, mid inferior, apical inferior, basal inferolateral & mid inferoalteral regions - EF 21% low risk scan   POLYPECTOMY     RENAL ARTERY STENT Left 03/24/2004   6x45m Genesis stent (Dr. RMarella Chimes   RIGHT/LEFT HEART CATH AND CORONARY ANGIOGRAPHY [name] 12/28/2017   Procedure: RIGHT/LEFT HEART CATH AND CORONARY ANGIOGRAPHY;  Surgeon: MLarey Dresser MD;  [facility];  Service: Cardiovascular;  Laterality: [name];   TRANSTHORACIC ECHOCARDIOGRAM  12/2012   EF 30-35; LV mod to severely dilated, mod concentric hypertrophy, severe hypokinesis of inferolateral myocardium, moderate hypokineis of  anteroseptal region, grade 1 diastolic dysfunction; mod MR; LA mod-severely dialted; RV mod dialted; RA mildly dilated; PA peak pressure 361mg   VIDEO BRONCHOSCOPY WITH ENDOBRONCHIAL NAVIGATION [name] 02/23/2017   Procedure: VIDEO BRONCHOSCOPY WITH ENDOBRONCHIAL NAVIGATION;  Surgeon: StMelrose NakayamaMD;  [place] [facility] Service: Thoracic;  Laterality: [name];    Current Hospital Medications:  Home Meds:  No current facility-administered medications on file prior to encounter.   Current Outpatient Medications on File Prior to Encounter  Medication Sig Dispense Refill   albuterol (PROVENTIL HFA;VENTOLIN HFA) 108 (90 BASE) MCG/ACT inhaler Inhale 2 puffs into the lungs every 6 (six) hours as needed for wheezing or shortness of breath. 3 Inhaler 4   albuterol (PROVENTIL) (2.5 MG/3ML) 0.083% nebulizer solution USE 1 VIAL IN NEBULIZER 4 TIMES DAILY (Patient taking differently: Take 2.5 mg by nebulization 4 (four) times daily.) 120 mL 11   amiodarone (PACERONE) 200 MG tablet Take 1 tablet (200 mg total) by mouth daily. 45 tablet  3   atorvastatin (LIPITOR) 80 MG tablet TAKE 1 TABLET BY MOUTH EVERY DAY (Patient taking differently: Take 80 mg by mouth daily.) 30 tablet 1   ATROVENT HFA 17 MCG/ACT inhaler Inhale 2 puffs into the lungs every 4 (four) hours as needed for wheezing.   12   carvedilol (COREG) 12.5 MG tablet Take 1 tablet (12.5 mg total) by mouth 2 (two) times daily with a meal. 60 tablet 11   dapagliflozin propanediol (FARXIGA) 10 MG TABS tablet Take 1 tablet (10 mg total) by mouth daily before breakfast. 30 tablet 11   diazepam (VALIUM) 5 MG tablet Take 1 tablet (5 mg total) by mouth every 8 (eight) hours as needed for anxiety. 1-2 tabs every 8 hours if needed for tremor 30 tablet 5   ezetimibe (ZETIA) 10 MG tablet TAKE 1 TABLET BY MOUTH  DAILY (Patient taking differently: Take 10 mg by mouth daily.) 90 tablet 3   finasteride (PROSCAR) 5 MG tablet TAKE 1 TABLET BY MOUTH  DAILY (Patient taking  differently: Take 5 mg by mouth daily.) 90 tablet 3   fluticasone (FLONASE) 50 MCG/ACT nasal spray Instill 1 spray in each  nostril daily (Patient taking differently: Place 1 spray into both nostrils daily as needed for allergies.) 32 g 3   Fluticasone-Umeclidin-Vilant (TRELEGY ELLIPTA) 100-62.5-25 MCG/INH AEPB Inhale 1 puff into the lungs daily. Rinse mouth 60 each 12   icosapent Ethyl (VASCEPA) 1 g capsule TAKE 2 CAPSULES (2 G TOTAL) BY MOUTH 2 (TWO) TIMES A DAY. (Patient taking differently: Take 2 g by mouth 2 (two) times daily.) 120 capsule 11   isosorbide mononitrate (IMDUR) 60 MG 24 hr tablet TAKE 1.5 TABLETS (90 MG TOTAL) BY MOUTH DAILY. (Patient taking differently: Take 90 mg by mouth daily.) 90 tablet 1   levothyroxine (SYNTHROID) 50 MCG tablet TAKE 1 TABLET BY MOUTH  DAILY BEFORE BREAKFAST (Patient taking differently: Take 50 mcg by mouth daily before breakfast.) 90 tablet 1   Multiple Vitamins-Minerals (MULTIVITAMIN WITH MINERALS) tablet Take 1 tablet by mouth daily.     Multiple Vitamins-Minerals (PRESERVISION AREDS 2 PO) Take 1 capsule by mouth at bedtime.     nitroGLYCERIN (NITROSTAT) 0.4 MG SL tablet PLACE 1 TABLET UNDER THE TONGUE EVERY 5 MINUTES AS NEEDED FOR CHEST PAIN. (Patient taking differently: Place 0.4 mg under the tongue every 5 (five) minutes as needed for chest pain.) 25 tablet 3   NON FORMULARY See admin instructions. CPAP At bedtime And during the day as needed when napping     OXYGEN Inhale 2.5-3 L into the lungs continuous.     potassium chloride SA (KLOR-CON M20) 20 MEQ tablet Take 1.5 tablets (30 mEq total) by mouth daily. 135 tablet 3   Rivaroxaban (XARELTO) 15 MG TABS tablet Take 1 tablet (15 mg total) by mouth daily with supper. 30 tablet 11   sacubitril-valsartan (ENTRESTO) 49-51 MG Take 1 tablet by mouth 2 (two) times daily.     sacubitril-valsartan (ENTRESTO) 97-103 MG Take 1 tablet by mouth 2 (two) times daily. 180 tablet 3   spironolactone (ALDACTONE) 25 MG  tablet TAKE 1 TABLET BY MOUTH  DAILY (Patient taking differently: Take 25 mg by mouth daily.) 90 tablet 3   torsemide (DEMADEX) 20 MG tablet Take 3 tablets (60 mg total) by mouth in the morning AND 2 tablets (40 mg total) every evening. 150 tablet 11   traMADol (ULTRAM) 50 MG tablet Take 1 tablet (50 mg total) by mouth every 6 (six) hours as  needed. (Patient taking differently: Take 50 mg by mouth every 6 (six) hours as needed for moderate pain.) 75 tablet 3     Scheduled Meds:  acetaminophen (TYLENOL) oral liquid 160 mg/5 mL  650 mg Oral Q6H   arformoterol  15 mcg Nebulization BID   aspirin  81 mg Per Tube Daily   atorvastatin  80 mg Per Tube Daily   bethanechol  10 mg Per Tube TID   budesonide (PULMICORT) nebulizer solution  0.5 mg Nebulization BID   chlorhexidine gluconate (MEDLINE KIT)  15 mL Mouth Rinse BID   Chlorhexidine Gluconate Cloth  6 each Topical Q0600   clonazePAM  1 mg Per Tube BID   docusate  100 mg Per Tube BID   feeding supplement (PROSource TF)  90 mL Per Tube TID   free water  300 mL Per Tube Q4H   insulin aspart  0-20 Units Subcutaneous Q4H   insulin aspart  15 Units Subcutaneous Q4H   insulin glargine  45 Units Subcutaneous BID   ipratropium  0.5 mg Nebulization Q6H   levothyroxine  50 mcg Per Tube Q0600   mouth rinse  15 mL Mouth Rinse 10 times per day   pantoprazole sodium  40 mg Per Tube Daily   polyethylene glycol  17 g Per Tube Daily   senna  1 tablet Per Tube Daily   sodium chloride flush  10-40 mL Intracatheter Q12H   sodium chloride flush  10-40 mL Intracatheter Q12H   sodium chloride flush  3 mL Intravenous Q12H   Continuous Infusions:  sodium chloride 10 mL/hr at 05/19/21 1800   sodium chloride     sodium chloride     amiodarone 30 mg/hr (05/19/21 1800)   dexmedetomidine (PRECEDEX) IV infusion 1 mcg/kg/hr (05/19/21 0811)   feeding supplement (VITAL 1.5 CAL) 1,000 mL (05/19/21 1039)   fentaNYL infusion INTRAVENOUS 25 mcg/hr (05/19/21 1800)    heparin 1,300 Units/hr (05/19/21 1800)   insulin 11.5 Units/hr (05/19/21 1800)   meropenem (MERREM) IV Stopped (05/19/21 1317)   phenylephrine (NEO-SYNEPHRINE) Adult infusion Stopped (05/19/21 0528)   vancomycin     vasopressin Stopped (05/19/21 1052)   PRN Meds:.sodium chloride, Place/Maintain arterial line **AND** sodium chloride, acetaminophen (TYLENOL) oral liquid 160 mg/5 mL, dextrose, docusate, fentaNYL, fentaNYL (SUBLIMAZE) injection, fentaNYL (SUBLIMAZE) injection, sodium chloride flush, sodium chloride flush, sodium chloride flush  Allergies:  Allergies  Allergen Reactions   Tussionex Pennkinetic Er [Hydrocod Polst-Cpm Polst Er] Other (See Comments)    UNSPECIFIED REACTION  > "caused prostate problems"   Ace Inhibitors Cough    Family History  Problem Relation Age of Onset   Lung cancer Mother    Cancer Mother    Diabetes Mother    Hypertension Mother    Hyperlipidemia Mother    Heart disease Mother        before age 70   Heart attack Father    Heart disease Father        before age 73   Hypertension Father    Colon cancer Sister    Cancer Sister    Hypertension Sister    Hyperlipidemia Sister    Parkinson's disease Sister    Diabetes Sister    Heart disease Sister        before age 39   Heart attack Maternal Grandmother    Diabetes Daughter     Social History:  reports that he quit smoking about 18 years ago. His smoking use included cigarettes and pipe. He has a  60.00 pack-year smoking history. He has never used smokeless tobacco. He reports that he does not drink alcohol and does not use drugs.  ROS: A complete review of systems was performed.  All systems are negative except for pertinent findings as noted.  Physical Exam:  Vital signs in last 24 hours: Temp:  [98.6 F (37 C)-104.36 F (40.2 C)] 98.6 F (37 C) (06/09 1600) Pulse Rate:  [91-149] 130 (06/09 1800) Resp:  [23-71] 31 (06/09 1800) BP: (83-160)/(48-106) 96/56 (06/09 1800) SpO2:  [83 %-100  %] 97 % (06/09 1800) Arterial Line BP: (83-168)/(35-96) 101/55 (06/09 1800) FiO2 (%):  [40 %-60 %] 40 % (06/09 1600) Weight:  [993 kg] 123 kg (06/09 0500) Constitutional:  Alert and oriented, No acute distress Cardiovascular: Regular rate and rhythm Respiratory: Normal respiratory effort, Lungs clear bilaterally GI: Abdomen is soft, nontender, nondistended, no abdominal masses GU: No CVA tenderness Phimotic foreskin with pinhole opening and penile retraction with the large panniculus.   Neurologic: Grossly intact, no focal deficits Psychiatric: Normal mood and affect  Laboratory Data:  Recent Labs    05/17/21 0210 05/18/21 0424 05/19/21 0514  WBC 15.7* 15.4* 17.6*  HGB 13.5 12.9* 13.2  HCT 43.9 42.6 42.9  PLT 106* 108* 145*    Recent Labs    05/17/21 0210 05/17/21 1803 05/18/21 0735 05/19/21 1049  NA 145 148* 146* 144  K 4.1 4.1 4.7 4.5  CL 111 113* 112* 114*  GLUCOSE 220* 185* 241* 302*  BUN 92* 85* 79* 60*  CALCIUM 8.6* 8.2* 8.5* 8.2*  CREATININE 1.74* 1.53* 1.63* 1.64*     Results for orders placed or performed during the hospital encounter of 04/29/2021 (from the past 24 hour(s))  Glucose, capillary     Status: Abnormal   Collection Time: 05/18/21  7:40 PM  Result Value Ref Range   Glucose-Capillary 293 (H) 70 - 99 mg/dL  Glucose, capillary     Status: Abnormal   Collection Time: 05/18/21 11:39 PM  Result Value Ref Range   Glucose-Capillary 279 (H) 70 - 99 mg/dL  Glucose, capillary     Status: Abnormal   Collection Time: 05/19/21  3:34 AM  Result Value Ref Range   Glucose-Capillary 238 (H) 70 - 99 mg/dL  Heparin level (unfractionated)     Status: None   Collection Time: 05/19/21  5:14 AM  Result Value Ref Range   Heparin Unfractionated 0.55 0.30 - 0.70 IU/mL  CBC     Status: Abnormal   Collection Time: 05/19/21  5:14 AM  Result Value Ref Range   WBC 17.6 (H) 4.0 - 10.5 K/uL   RBC 4.23 4.22 - 5.81 MIL/uL   Hemoglobin 13.2 13.0 - 17.0 g/dL   HCT 42.9  39.0 - 52.0 %   MCV 101.4 (H) 80.0 - 100.0 fL   MCH 31.2 26.0 - 34.0 pg   MCHC 30.8 30.0 - 36.0 g/dL   RDW 13.8 11.5 - 15.5 %   Platelets 145 (L) 150 - 400 K/uL   nRBC 0.0 0.0 - 0.2 %  Procalcitonin     Status: None   Collection Time: 05/19/21  5:14 AM  Result Value Ref Range   Procalcitonin 0.21 ng/mL  Glucose, capillary     Status: Abnormal   Collection Time: 05/19/21  8:00 AM  Result Value Ref Range   Glucose-Capillary 255 (H) 70 - 99 mg/dL  Comprehensive metabolic panel     Status: Abnormal   Collection Time: 05/19/21 10:49 AM  Result Value Ref  Range   Sodium 144 135 - 145 mmol/L   Potassium 4.5 3.5 - 5.1 mmol/L   Chloride 114 (H) 98 - 111 mmol/L   CO2 18 (L) 22 - 32 mmol/L   Glucose, Bld 302 (H) 70 - 99 mg/dL   BUN 60 (H) 8 - 23 mg/dL   Creatinine, Ser 1.64 (H) 0.61 - 1.24 mg/dL   Calcium 8.2 (L) 8.9 - 10.3 mg/dL   Total Protein 5.9 (L) 6.5 - 8.1 g/dL   Albumin 2.6 (L) 3.5 - 5.0 g/dL   AST 38 15 - 41 U/L   ALT 37 0 - 44 U/L   Alkaline Phosphatase 35 (L) 38 - 126 U/L   Total Bilirubin 0.7 0.3 - 1.2 mg/dL   GFR, Estimated 43 (L) >60 mL/min   Anion gap 12 5 - 15  Glucose, capillary     Status: Abnormal   Collection Time: 05/19/21 11:51 AM  Result Value Ref Range   Glucose-Capillary 301 (H) 70 - 99 mg/dL  Glucose, capillary     Status: Abnormal   Collection Time: 05/19/21  1:31 PM  Result Value Ref Range   Glucose-Capillary 319 (H) 70 - 99 mg/dL  Glucose, capillary     Status: Abnormal   Collection Time: 05/19/21  2:43 PM  Result Value Ref Range   Glucose-Capillary 305 (H) 70 - 99 mg/dL  Glucose, capillary     Status: Abnormal   Collection Time: 05/19/21  3:41 PM  Result Value Ref Range   Glucose-Capillary 266 (H) 70 - 99 mg/dL  Glucose, capillary     Status: Abnormal   Collection Time: 05/19/21  4:49 PM  Result Value Ref Range   Glucose-Capillary 219 (H) 70 - 99 mg/dL   Recent Results (from the past 240 hour(s))  Urine Culture     Status: None   Collection  Time: 05/09/21  8:44 PM   Specimen: Urine, Catheterized  Result Value Ref Range Status   Specimen Description URINE, CATHETERIZED  Final   Special Requests NONE  Final   Culture   Final    NO GROWTH Performed at [facility], [address], [place], [place] [address]    Report Status 05/11/2021 FINAL  Final  Culture, Respiratory w Gram Stain     Status: None   Collection Time: 05/17/21  3:23 PM   Specimen: Tracheal Aspirate; Respiratory  Result Value Ref Range Status   Specimen Description TRACHEAL ASPIRATE  Final   Special Requests NONE  Final   Gram Stain   Final    NO WBC SEEN RARE GRAM POSITIVE COCCI RARE YEAST    Culture   Final    Normal respiratory flora-no Staph aureus or Pseudomonas seen Performed at [facility], [address], [place], [place] [address]    Report Status 05/19/2021 FINAL  Final    Renal Function: Recent Labs    05/14/21 0500 05/15/21 0500 05/16/21 0340 05/17/21 0210 05/17/21 1803 05/18/21 0735 05/19/21 1049  CREATININE 1.82* 1.89* 1.88* 1.74* 1.53* 1.63* 1.64*   Estimated Creatinine Clearance: 48.2 mL/min (A) (by C-G formula based on SCr of 1.64 mg/dL (H)).  Radiologic Imaging: DG CHEST PORT 1 VIEW  Result Date: 05/18/2021 CLINICAL DATA:  Intubation.  COPD.  Respiratory failure. EXAM: PORTABLE CHEST 1 VIEW COMPARISON:  05/17/2021.  CT 12/22/2020. FINDINGS: Endotracheal tube, NG tube, right central line stable position. AICD in stable position. Prior CABG. Stable cardiomegaly. Diffuse bilateral interstitial prominence again noted suggesting interstitial edema. Low  lung volumes with persistent bibasilar atelectatic changes. Left apical mass again noted. Biapical densities are again noted. Reference is made to prior CT report of 12/22/2020. No prominent pleural effusion or pneumothorax. IMPRESSION: 1.  Lines and tubes in stable position. 2.  AICD in stable position.  Prior CABG.  Cardiomegaly again noted. 3. Diffuse bilateral  interstitial prominence suggesting interstitial edema. 4. Low lung volumes with persistent bibasilar and right upper lung atelectatic changes. Biapical densities are again noted. Reference is made to prior CT report of 12/22/2020. Electronically Signed   By: Marcello Moores  Register   On: 05/18/2021 06:56    I independently reviewed the above imaging studies.  Procedure: He was prepped with betadine and I dilated the phimotic ring using a hemostat to 16 french. I then passed a 14 fr coude catheter into the urethra and into the bladder with clear return of urine. The balloon was filled with 45m of sterile fluid and the foley was placed to drainage.  Impression/Recommendation Phimosis and difficult Foley catheter placement: I was able to dilate his phimotic ring in place a 14 FPakistancoud Foley catheter.  Continue Foley catheter to gravity until no longer needed for medical management. 2. BPH with BOO: Resume finasteride when able  Matt R. Naeema Patlan MD 05/19/2021, 6:20 PM  [facility]  Pager: [phone]  CC: Dr. DIna Homes

## 2021-05-19 NOTE — Progress Notes (Signed)
[place] for  heparin  Indication: afib  Allergies  Allergen Reactions   Tussionex Pennkinetic Er [Hydrocod Polst-Cpm Polst Er] Other (See Comments)    UNSPECIFIED REACTION  > "caused prostate problems"   Ace Inhibitors Cough    Patient Measurements: Height: 5\' 7"  (170.2 cm) Weight: 123 kg (271 lb 2.7 oz) IBW/kg (Calculated) : 66.1 Heparin Dosing Weight: 125kg  Vital Signs: Temp: 103.28 F (39.6 C) (06/09 0945) Temp Source: Oral (06/09 0340) BP: 125/82 (06/09 0900) Pulse Rate: 127 (06/09 0945)  Labs: Recent Labs    05/17/21 0210 05/17/21 1803 05/18/21 0424 05/18/21 0735 05/19/21 0514  HGB 13.5  --  12.9*  --  13.2  HCT 43.9  --  42.6  --  42.9  PLT 106*  --  108*  --  145*  HEPARINUNFRC 0.39  --  0.39  --  0.55  CREATININE 1.74* 1.53*  --  1.63*  --      Estimated Creatinine Clearance: 48.5 mL/min (A) (by C-G formula based on SCr of 1.63 mg/dL (H)).  Assessment: 75 yo male with h/o Afib, Xarelto on hold, new thrombocytopenia on heparin and concern for HIT so was switched to argatroban. Heparin antibody was negative, so switched back to heparin on 05/14/21.  Heparin level (0.55) therapeutic, on drip rate 1300 units/hr. CBC stable    Goal of Therapy:  aPTT 50-90 seconds Monitor platelets by anticoagulation protocol: Yes   Plan:  Continue Heparin drip 1300 uts/hr  Daily heparin level and CBC   Hildred Laser, PharmD Clinical Pharmacist **Pharmacist phone directory can now be found on [email or web address] ([email or web address]).  Listed under [place].

## 2021-05-19 NOTE — Plan of Care (Signed)
  Problem: Education: Goal: Knowledge of General Education information will improve Description: Including pain rating scale, medication(s)/side effects and non-pharmacologic comfort measures Outcome: Progressing   Problem: Clinical Measurements: Goal: Ability to maintain clinical measurements within normal limits will improve Outcome: Progressing Goal: Will remain free from infection Outcome: Progressing Goal: Diagnostic test results will improve Outcome: Progressing Goal: Respiratory complications will improve Outcome: Progressing Goal: Cardiovascular complication will be avoided Outcome: Progressing   Problem: Activity: Goal: Risk for activity intolerance will decrease Outcome: Progressing   Problem: Nutrition: Goal: Adequate nutrition will be maintained Outcome: Progressing   Problem: Coping: Goal: Level of anxiety will decrease Outcome: Progressing   Problem: Elimination: Goal: Will not experience complications related to bowel motility Outcome: Progressing Goal: Will not experience complications related to urinary retention Outcome: Progressing   Problem: Pain Managment: Goal: General experience of comfort will improve Outcome: Progressing   Problem: Safety: Goal: Ability to remain free from injury will improve Outcome: Progressing   Problem: Skin Integrity: Goal: Risk for impaired skin integrity will decrease Outcome: Progressing   Problem: Activity: Goal: Ability to tolerate increased activity will improve Outcome: Progressing   Problem: Respiratory: Goal: Ability to maintain a clear airway and adequate ventilation will improve Outcome: Progressing   Problem: Role Relationship: Goal: Method of communication will improve Outcome: Progressing

## 2021-05-19 NOTE — Progress Notes (Signed)
NAME:  [name], MRN:  [id number], DOB:  Nov 13, 1946, LOS: 76 ADMISSION DATE:  04/13/2021, CONSULTATION DATE:  05/09/2021 REFERRING MD:  Dr. Randal Buba, CHIEF COMPLAINT:  SOB  History of Present Illness:  [name] is a 75 yo M with past medical hx as below presenting from home with complaints of SOB/ respiratory distress on 5/29.  Treated by EMS with duonebs, epi, solumedrol, and mag without improvement, requiring BVM.  On arrival to ER, patient required intubation as he was peri- arrest, unresponsive, bradycardic, temp 95.9, hypotensive, and apneic with diffuse apical wheeze requiring emergent intubation on arrival.  Noted to have pink frothy secretions on intubation.  PCCM called for admit.   Pertinent  Medical History  Morbid obesity, HFrEF (12/24/20 EF 40-45%, G2DD, PAF, mildly reduced systolic RV, mild MR), VT on amio/ ICD, CAD s/p CABG 1996, LBBB, HLD, COPD, OSA on CPAP, AAA (s/p repair 2015), NSCL lung CA s/p XRT 2018, anemia, BPH, DMT2, GERD, hiatal hernia, PVD, tremor  Significant Hospital Events: Including procedures, antibiotic start and stop dates in addition to other pertinent events   5/30 admitted for respiratory failure/ intubated, empiric CAP coverage/ duresis 6/1 BLLE Duplex> no DVT, unable to complete thoracentesis due to anatomy  6/2 Tmax 101, -2.9L in last 24 hours, on levo at 84mcg, precedex. Resp culture negative. BC pending. 6/3 switched from heparin to argatroban for thrombocytopenia, HIT ab pending 6/4 HIT AB negative, switched back to heparin, failed SBT due to tachypnea 6/7 afib w/ RVR, HR increased abruptly to 140s from 90s, received amio bolus and started on drip, got fentanyl and versed, started cefepime and vanco, d/c norepi, receiving vasopressin, precedex, phenylephrine  Interim History / Subjective:  Febrile to 102.6 overnight, 104.7 F this AM.  Per nursing staff was more anxious/restless overnight. Wife is present at the beside this morning.   Objective    Blood pressure (!) 129/56, pulse 96, temperature 100.2 F (37.9 C), temperature source Oral, resp. rate (!) 29, height 5\' 7"  (1.702 m), weight 123 kg, SpO2 (!) 85 %. CVP:  [7 mmHg-8 mmHg] 8 mmHg  Vent Mode: PRVC FiO2 (%):  [40 %] 40 % Set Rate:  [24 bmp] 24 bmp Vt Set:  [530 mL] 530 mL PEEP:  [5 cmH20] 5 cmH20 Plateau Pressure:  [16 cmH20-23 cmH20] 23 cmH20   Intake/Output Summary (Last 24 hours) at 05/19/2021 0735 Last data filed at 05/19/2021 0700 Gross per 24 hour  Intake 3375.14 ml  Output 3565 ml  Net -189.86 ml   Net for admission -7.9 L  Filed Weights   05/17/21 0228 05/18/21 0600 05/19/21 0500  Weight: 119.8 kg 118.9 kg 123 kg    Examination: General: critically ill-appearing man lying in bed, eyes open HEENT: [place]/AT, ETT in place Neuro: does not open eyes to voice or sternal rub CV: S1S2, RRR Extremities: clammy, warm hands bilaterally. No pretibial edema bilaterally.   PULM: tachypneic w/ use of accessory muscles, no rhonchi Skin: scattered bruising on upper extremities bilaterally, erythematous rash across back   Labs/imaging that I havepersonally reviewed  (right click and "Reselect all SmartList Selections" daily)   Glucose range overnight 238-293  Labs 6/8 -> 6/9: WBC 15.5 -> 17.6 Plts 108 -> 145 Procalcitonin 0.13 -> 0.21  coox 72% on 6/4, 68% 6/8  Echo 6/2> LVEF 20-25%, severely decreased function, global hypokinesis. Mild concentric LVH. Normal RV size and function. Mild MR. Mild to moderate aortic sclerosis. IVC with reduced respiratory variability.   Resolved Hospital Problem  list    Assessment & Plan:   Acute hypoxic/ hypercarbic respiratory failure  - Presented w/ L>R multifocal infiltrates c/w CAP, hx of lung cancer with 3 recurrences with baseline abnormal CXR,  COPD treated w/ 7 day course antibiotics: Azithromycin (5/29-6/2), Ceftriaxone (5/29-6/5).   - Failed SBTs due to WOB and agitation since 5/31-  - Intermittent fever, afib w/ RVR on  6/, leukocytosis c/w new infection, pro-calcitonin uptrending but still wnl For suspected infection: - Continue cefepime  - Suspect pt may have drug rash on his back from vancomycin, so will stop vanco and start meropenem (may d/c pending MRSA screen)  - Monitor results of 6/7 tracheal aspirate - UA, Blood cultures - Remove foley  - Remove lines - Cooling blanket, ice packs, and tylenol for fever For respiratory status:  - Weight 123 kg 6/9 up from 118.9 kg 6/8  - Last lasix 40 mg BID 6/7; will CTM - no additional lasix today given high insensible fluid losses with tachypnea, fever  - LTVV, 4-8cc/kg IBW with goal Pplat<30 and DP<15  - VAP prevention and PAD sedation protocols  - Daily SAT & SBT as appropriate  Shock- suspected septic with pneumonia as source, complicated by poor cardiac function. Not requiring pressors when off sedation. - S/p 7 day course antibiotics for CAP: Azithromycin (5/29-6/2), Ceftriaxone (5/29-6/5) - Continue abx as above - Continue vasopressors (vasopressin, precedex, phenylephrine) as required to maintain MAP >65 - S/p a-line placement, coox 6/8 - Euvolemic on exam w/ increased weight - diuretics as above  HFrEF (12/24/20 EF 40-45%, G2DD, mildly reduced systolic RV, mild MR) >> LVEF down to 20-25% this admission - Hx of VT on amio w/ICD, CAD s/p CABG 1996, LBBB, HTN, PAF - Diuresis as above - Pressors as needed for MAP >65  - Con't heparin; keep watching platelets. HIT Ab negative - Con't enteral amiodarone, amiodarone drip - Con't atorvastatin - Hold home entresto, spironolactone, torsemide, imdur, carvedilol given shock - Tele monitoring  - Maintain appropriate electrolytes  CKD 3b, hx BPH, phimosis on PTA, finasteride  - Phimosis required bedside dilation by urology for foley placement 5/30; Remove foley 6/8 due to concern for infection - I&O cath as needed   - Con't free water, strict I/OS, monitor renal function - Renally dose meds, avoid  nephrotoxic meds - Continue bethanechol - Consider outpt f/u urology for phimosis  Thrombocytopenia 4T score= 4, intermediate risk for HIT, although B- lactams can also decrease platelets. HIT Ab negative - Plts stable today - If platelets drop further, can check SRA - Con't heparin  Hyperglycemia, uncontrolled - Hx T2DM, A1C 5.9 05/09/21 - BGs still in 200s following insulin adjustment yesterday - Goal BG 140-180 - Increase NovoLOG from 11 to 15 units Q4 with hold parameters   - Lantus 45 units BID    - SSI, moderate scale   Constipation - Last BM 6/2, scheduled miralax and colace 6/7, no BM w/ Bisacodyl suppository 6/8 - Soap suds enema 6/9  Hypernatremia, improving   - Na wnl 6/9 - Con't monitoring, FWF   Chronic Conditions:   Hypothyroidism, stable - Con't synthroid  Class 3 obesity, stable - Education when able   Baseline anxiety disorder - Klonopin BID    No family present at bedside during rounds today.  Best practice (right click and "Reselect all SmartList Selections" daily)  Diet:  TF Pain/Anxiety/Delirium protocol (if indicated): Yes (RASS goal 0) VAP protocol (if indicated): Yes DVT prophylaxis: Systemic AC GI prophylaxis: PPI  Glucose control:  Basal bolus, SSI Central venous access:  Yes, and it is still needed Arterial line:  no Foley:  No, removed 6/9 Mobility:  bed rest  PT consulted: [name] Last date of multidisciplinary goals of care discussion:  Code Status:  full code Disposition: ICU  This patient is critically ill with multiple organ system failure which requires frequent high complexity decision making, assessment, support, evaluation, and titration of therapies. This was completed through the application of advanced monitoring technologies and extensive interpretation of multiple databases. During this encounter critical care time was devoted to patient care services described in this note for 60 minutes.  Margot Chimes, medical student  05/19/21 7:35 AM [facility]  For contact information, see Amion. If no response to pager, please call PCCM consult pager. After hours, 7PM- 7AM, please call Elink.

## 2021-05-19 NOTE — Progress Notes (Signed)
PT Cancellation Note  Patient Details Name: [name] MRN: [id number] DOB: 03-07-1946   Cancelled Treatment:    Reason Eval/Treat Not Completed: Patient not medically ready (pt currently sedated and unable to participate)   Evamaria Detore B Tarika Mckethan 05/19/2021, 7:32 AM Bayard Males, PT Acute Rehabilitation Services Pager: [phone] Office: [phone]

## 2021-05-19 NOTE — Progress Notes (Signed)
Pharmacy Antibiotic Note  [name] is a 75 y.o. male admitted on 04/22/2021 with respiratory failure with hypoxia due to pneumonia.  Pt completed course of azithromycin/ceftriaxone (see below for dates). CXR now with worsening right opacity and he was placed on cefepime and vancomycin on 6/7. Due to fever and possible drug rash plans are to change cefepime to meropenem -WBC= 17.6, tmax= 103.2, SCr= 1.63, CrCl ~ 50 -6/7 trach cultures with normal flora, blood cultures ordered  Plan: -Meropenem 1gm IV q12h -Continue vancomycin 1000mg  IV q24h -Will follow renal function, cultures and clinical progress   Height: 5\' 7"  (170.2 cm) Weight: 123 kg (271 lb 2.7 oz) IBW/kg (Calculated) : 66.1  Temp (24hrs), Avg:102.9 F (39.4 C), Min:98.2 F (36.8 C), Max:104.18 F (40.1 C)  Recent Labs  Lab 05/15/21 0500 05/16/21 0340 05/17/21 0210 05/17/21 1803 05/18/21 0424 05/18/21 0735 05/19/21 0514  WBC 17.5* 18.4* 15.7*  --  15.4*  --  17.6*  CREATININE 1.89* 1.88* 1.74* 1.53*  --  1.63*  --      Estimated Creatinine Clearance: 48.5 mL/min (A) (by C-G formula based on SCr of 1.63 mg/dL (H)).    Allergies  Allergen Reactions   Tussionex Pennkinetic Er [Hydrocod Polst-Cpm Polst Er] Other (See Comments)    UNSPECIFIED REACTION  > "caused prostate problems"   Ace Inhibitors Cough    Antimicrobials this admission: Azithromycin 5/30-6/3 Ceftriaxone 5/30-6/5 Vancomycin 6/7 >> Cefepime 6/7 >> 6/9 Meropenem 6/9>>  Microbiology results: 5/30 BCx X 2:  NG/final 5/30 Trach aspirate: normal respiratory flora 5/30 Urine cx: NG/final 5/30 MRSA PCR: negative 5/29 COVID, flu A, flu B: negative 6/7 Trach aspirate: normal flora 6/9 blood x2  Hildred Laser, PharmD Clinical Pharmacist **Pharmacist phone directory can now be found on [email or web address] ([email or web address]).  Listed under [place].

## 2021-05-20 LAB — GLUCOSE, CAPILLARY
Glucose-Capillary: 153 mg/dL — ABNORMAL HIGH (ref 70–99)
Glucose-Capillary: 156 mg/dL — ABNORMAL HIGH (ref 70–99)
Glucose-Capillary: 159 mg/dL — ABNORMAL HIGH (ref 70–99)
Glucose-Capillary: 162 mg/dL — ABNORMAL HIGH (ref 70–99)
Glucose-Capillary: 165 mg/dL — ABNORMAL HIGH (ref 70–99)
Glucose-Capillary: 171 mg/dL — ABNORMAL HIGH (ref 70–99)
Glucose-Capillary: 174 mg/dL — ABNORMAL HIGH (ref 70–99)
Glucose-Capillary: 175 mg/dL — ABNORMAL HIGH (ref 70–99)
Glucose-Capillary: 184 mg/dL — ABNORMAL HIGH (ref 70–99)
Glucose-Capillary: 187 mg/dL — ABNORMAL HIGH (ref 70–99)
Glucose-Capillary: 207 mg/dL — ABNORMAL HIGH (ref 70–99)
Glucose-Capillary: 217 mg/dL — ABNORMAL HIGH (ref 70–99)

## 2021-05-20 LAB — BASIC METABOLIC PANEL
Anion gap: 11 (ref 5–15)
BUN: 76 mg/dL — ABNORMAL HIGH (ref 8–23)
CO2: 19 mmol/L — ABNORMAL LOW (ref 22–32)
Calcium: 8.5 mg/dL — ABNORMAL LOW (ref 8.9–10.3)
Chloride: 112 mmol/L — ABNORMAL HIGH (ref 98–111)
Creatinine, Ser: 1.83 mg/dL — ABNORMAL HIGH (ref 0.61–1.24)
GFR, Estimated: 38 mL/min — ABNORMAL LOW (ref >60)
Glucose, Bld: 198 mg/dL — ABNORMAL HIGH (ref 70–99)
Potassium: 5.1 mmol/L (ref 3.5–5.1)
Sodium: 142 mmol/L (ref 135–145)

## 2021-05-20 LAB — VANCOMYCIN, PEAK: Vancomycin Pk: 30 ug/mL (ref 30–40)

## 2021-05-20 LAB — CBC
HCT: 42 % (ref 39.0–52.0)
Hemoglobin: 12.6 g/dL — ABNORMAL LOW (ref 13.0–17.0)
MCH: 31.2 pg (ref 26.0–34.0)
MCHC: 30 g/dL (ref 30.0–36.0)
MCV: 104 fL — ABNORMAL HIGH (ref 80.0–100.0)
Platelets: 170 10*3/uL (ref 150–400)
RBC: 4.04 MIL/uL — ABNORMAL LOW (ref 4.22–5.81)
RDW: 13.9 % (ref 11.5–15.5)
WBC: 22.2 10*3/uL — ABNORMAL HIGH (ref 4.0–10.5)
nRBC: 0 % (ref 0.0–0.2)

## 2021-05-20 LAB — MRSA PCR SCREENING: MRSA by PCR: NEGATIVE

## 2021-05-20 LAB — HEPARIN LEVEL (UNFRACTIONATED): Heparin Unfractionated: 0.68 IU/mL (ref 0.30–0.70)

## 2021-05-20 LAB — VANCOMYCIN, TROUGH: Vancomycin Tr: 15 ug/mL (ref 15–20)

## 2021-05-20 MED ORDER — FUROSEMIDE 10 MG/ML IJ SOLN
40.0000 mg | Freq: Two times a day (BID) | INTRAMUSCULAR | Status: DC
  Administered 2021-05-20 – 2021-05-22 (×5): 40 mg via INTRAVENOUS
  Filled 2021-05-20 (×5): qty 4

## 2021-05-20 MED ORDER — GERHARDT'S BUTT CREAM
TOPICAL_CREAM | Freq: Four times a day (QID) | CUTANEOUS | Status: DC
  Administered 2021-05-21 – 2021-05-24 (×6): 1 via TOPICAL
  Filled 2021-05-20: qty 1

## 2021-05-20 MED ORDER — IPRATROPIUM BROMIDE 0.02 % IN SOLN
0.5000 mg | Freq: Four times a day (QID) | RESPIRATORY_TRACT | Status: DC
  Administered 2021-05-21 (×3): 0.5 mg via RESPIRATORY_TRACT
  Filled 2021-05-20 (×3): qty 2.5

## 2021-05-20 MED ORDER — METOPROLOL TARTRATE 25 MG/10 ML ORAL SUSPENSION
12.5000 mg | Freq: Three times a day (TID) | ORAL | Status: DC
  Administered 2021-05-20 – 2021-05-21 (×3): 12.5 mg
  Filled 2021-05-20 (×3): qty 5

## 2021-05-20 NOTE — Progress Notes (Signed)
OT Cancellation Note  Patient Details Name: [name] MRN: [id number] DOB: [birth year]   Cancelled Treatment:    Reason Eval/Treat Not Completed: Medical issues which prohibited therapy- pt with HR from 133-150 at rest supine in bed.   Jolaine Artist, OT Acute Rehabilitation Services Pager [phone] Office [phone]   [name] 05/20/2021, 10:54 AM

## 2021-05-20 NOTE — Progress Notes (Signed)
PT Cancellation Note  Patient Details Name: [name] MRN: [id number] DOB: [birth year]   Cancelled Treatment:    Reason Eval/Treat Not Completed: Patient not medically ready (currently with Afib 140 at rest supine)   [name] 05/20/2021, 9:49 AM Bayard Males, PT Acute Rehabilitation Services Pager: [phone] Office: [phone]

## 2021-05-20 NOTE — Progress Notes (Signed)
Placed patient back in full support due to increased HR 154, RR35.

## 2021-05-20 NOTE — Progress Notes (Addendum)
NAME:  [name], MRN:  [id number], DOB:  August 09, 1946, LOS: 83 ADMISSION DATE:  04/12/2021, CONSULTATION DATE:  05/09/2021 REFERRING MD:  Dr. Randal Buba, CHIEF COMPLAINT:  SOB  History of Present Illness:  [name] is a 75 yo M with past medical hx as below presenting from home with complaints of SOB/ respiratory distress on 5/29.  Treated by EMS with duonebs, epi, solumedrol, and mag without improvement, requiring BVM.  On arrival to ER, patient required intubation as he was peri- arrest, unresponsive, bradycardic, temp 95.9, hypotensive, and apneic with diffuse apical wheeze requiring emergent intubation on arrival.  Noted to have pink frothy secretions on intubation.  PCCM called for admit.   Pertinent  Medical History  Morbid obesity, HFrEF (12/24/20 EF 40-45%, G2DD, PAF, mildly reduced systolic RV, mild MR), VT on amio/ ICD, CAD s/p CABG 1996, LBBB, HLD, COPD, OSA on CPAP, AAA (s/p repair 2015), NSCL lung CA s/p XRT 2018, anemia, BPH, DMT2, GERD, hiatal hernia, PVD, tremor  Significant Hospital Events: Including procedures, antibiotic start and stop dates in addition to other pertinent events   5/30 admitted for respiratory failure/ intubated, empiric CAP coverage/ duresis 6/1 BLLE Duplex> no DVT, unable to complete thoracentesis due to anatomy  6/2 Tmax 101, -2.9L in last 24 hours, on levo at 45mcg, precedex. Resp culture negative. BC pending. 6/3 switched from heparin to argatroban for thrombocytopenia, HIT ab pending 6/4 HIT AB negative, switched back to heparin, failed SBT due to tachypnea 6/7 afib w/ RVR, HR increased abruptly to 140s from 90s, received amio bolus and started on drip, got fentanyl and versed, started cefepime and vanco, d/c norepi, receiving vasopressin, precedex, phenylephrine 6/9 - Stopped cefepime and started meropenem, continued vanco due to concern for cefepime- associated drug rash  Interim History / Subjective:  Urology dilated phimotic ring and placed foley  yesterday evening. Left PIV found infiltrated this morning. Weaning on the vent this morning. Wife present at bedside during rounds.  Objective   Blood pressure 112/73, pulse (!) 138, temperature (!) 100.4 F (38 C), temperature source Esophageal, resp. rate 20, height 5\' 7"  (1.702 m), weight 126.2 kg, SpO2 95 %.    Vent Mode: PRVC FiO2 (%):  [40 %] 40 % Set Rate:  [24 bmp] 24 bmp Vt Set:  [530 mL] 530 mL PEEP:  [5 cmH20] 5 cmH20 Plateau Pressure:  [15 cmH20-18 cmH20] 17 cmH20   Intake/Output Summary (Last 24 hours) at 05/20/2021 8850 Last data filed at 05/20/2021 0800 Gross per 24 hour  Intake 4881.96 ml  Output 2410 ml  Net 2471.96 ml    Net for admission -7.9 L  Filed Weights   05/18/21 0600 05/19/21 0500 05/20/21 0400  Weight: 118.9 kg 123 kg 126.2 kg    Examination: General: critically ill-appearing man lying in bed, eyes open HEENT: [place]/AT, ETT in place, bloody secretions noted in ETT Neuro: Does not track with eyes this morning or move extremities on command CV: S1S2, RRR Extremities: No pretibial edema bilaterally.   PULM: slight use of accessory muscles, no rhonchi Skin: scattered bruising on upper extremities bilaterally, with slight erythema, skin weeping and vesicles at near infiltrated PIV on left forearm. mild erythematous rash with mild areas of petechiae within erythematous patches, some extension from towards left flank    Labs/imaging that I havepersonally reviewed  (right click and "Reselect all SmartList Selections" daily)   Glucose range overnight 165-207  Na 142,  BUN 76 Cr 1.83  Labs 6/8 -> 6/9 ->  6/10: WBC 15.5 -> 17.6 -> 22.2 Plts 108 -> 145 -> 170 Procalcitonin 0.13 -> 0.21  coox 72% on 6/4, 68% 6/8  Echo 6/2> LVEF 20-25%, severely decreased function, global hypokinesis. Mild concentric LVH. Normal RV size and function. Mild MR. Mild to moderate aortic sclerosis. IVC with reduced respiratory variability.   Resolved Hospital Problem list     Assessment & Plan:   Acute hypoxic/ hypercarbic respiratory failure  - Presented w/ L>R multifocal infiltrates c/w CAP, hx of lung cancer with 3 recurrences with baseline abnormal CXR,  COPD treated w/ 7 day course antibiotics: Azithromycin (5/29-6/2), Ceftriaxone (5/29-6/5).   - Failed SBTs due to WOB and agitation since 5/31-  - Intermittent fever, afib w/ RVR on 6/, leukocytosis c/w new infection, pro-calcitonin uptrending but still wnl For suspected infection: - 6/7 tracheal aspirate showed normal respiratory flora-no Staph aureus or Pseudomonas - Continue vanco and meropenem (may d/c Vanco pending repeat MRSA screen today 6/10)  - Follow UA, Blood cultures  - Remove lines as able - Cooling blanket, ice packs, and scheduled tylenol for fever For respiratory status: - Given likely third spacing and uptrending weight (126.2 kg from 123 yesterday, 118.9 the day before), restart lasix 40 mg BID 6/10 - Fentanyl PRN  for RASS goal while intubated - LTVV, 4-8cc/kg IBW with goal Pplat<30 and DP<15  - VAP prevention and PAD sedation protocols  - Daily SAT & SBT as appropriate  Shock-suspected septic with pneumonia as source, complicated by poor cardiac function. Not requiring pressors when off sedation. - S/p 7 day course antibiotics for CAP: Azithromycin (5/29-6/2), Ceftriaxone (5/29-6/5) - Continue abx as above - Continue vasopressors (vasopressin, precedex, phenylephrine) as required to maintain MAP >65 - Euvolemic on exam w/ increased weight - diuretics as above  HFrEF (12/24/20 EF 40-45%, G2DD, mildly reduced systolic RV, mild MR) >> LVEF down to 20-25% this admission - Hx of VT on amio w/ICD, CAD s/p CABG 1996, LBBB, HTN, PAF - Diuresis as above - Pressors as needed for MAP >65  - Con't heparin; keep watching platelets. HIT Ab negative - Con't amiodarone - Con't atorvastatin - Initially held home entresto, spironolactone, torsemide, imdur, carvedilol given shock; given stable Bps  and tachycardia, start metoprolol 6/10 - Tele monitoring  - Maintain appropriate electrolytes  CKD 3b, hx BPH, phimosis on PTA, finasteride  - Phimosis required bedside dilation by urology for foley placement 5/30; Remove foley 6/8 due to concern for infection; replaced 6/9 by urology - I&O cath as needed   - Con't free water, strict I/OS, monitor renal function - Renally dose meds, avoid nephrotoxic meds - Continue bethanechol - Consider outpt f/u urology for phimosis  Thrombocytopenia 4T score= 4, intermediate risk for HIT, although B- lactams can also decrease platelets. HIT Ab negative - Plts stable today - If platelets drop further, can check SRA - Con't heparin  Hyperglycemia, uncontrolled - Hx T2DM, A1C 5.9 05/09/21 - BGs still in 200s following insulin adjustment yesterday - Goal BG 140-180 - Was on NovoLOG from 15 units Q4 with hold parameters, Lantus 45 units BID, SSI, moderate scale; insulin drip started 6/9   - Con't insulin drip  Constipation - Last BM 6/2, scheduled miralax and colace 6/7, no BM w/ Bisacodyl suppository 6/8 - Soap suds enema 6/10  Hypernatremia, improving   - Na wnl 142 - Con't monitoring, FWF - consider stopping FWF once Na stably wnl  Chronic Conditions:   Hypothyroidism, stable - Con't synthroid  Class 3 obesity,  stable - Education when able   Baseline anxiety disorder - Klonopin BID   No family present at bedside during rounds today.  Best practice (right click and "Reselect all SmartList Selections" daily)  Diet:  TF Pain/Anxiety/Delirium protocol (if indicated): Yes (RASS goal 0) VAP protocol (if indicated): Yes DVT prophylaxis: Systemic AC GI prophylaxis: PPI Glucose control:  Basal bolus, SSI Central venous access:  Yes, and it is still needed Arterial line:  no Foley:  No, removed 6/9 Mobility:  bed rest  PT consulted: [name] Last date of multidisciplinary goals of care discussion:  Code Status:  full code Disposition:  ICU   Margot Chimes, medical student 05/20/21 8:12 AM [facility]  For contact information, see Amion. If no response to pager, please call PCCM consult pager. After hours, 7PM- 7AM, please call Elink.   PCCM Attending:   Is a 75 year old gentleman past medical history of obesity diastolic heart failure, PAF, VT on amnio history of ICD, CAD with a CABG in 1996, COPD, OSA on CPAP.  Patient presents with complaints of shortness of breath and respiratory distress.  Patient was found to have suspected septic shock possible pneumonia patient had ongoing sepsis-like symptoms with fever and escalating white blood cell count.  He developed a rash and fever of 104.  Question of drug fever antibiotics was switched to meropenem.  BP (!) 114/42   Pulse (!) 139   Temp 99.9 F (37.7 C) (Esophageal)   Resp (!) 24   Ht 5\' 7"  (1.702 m)   Wt 126.2 kg   SpO2 94%   BMI 43.58 kg/m   General: Obese male intubated on mechanical life support HEENT: Endotracheal tube in place Heart: Regular rhythm S1-S2 Lungs: Biomechanically ventilated breath sounds Abdomen: Obese soft nontender nondistended  Labs: Reviewed sodium 142 Bicarb 19 BUN 76 Creatinine 1.83 White blood cell count 22,000 Hemoglobin 12.6  Assessment: Remains on mechanical life support Acute hypoxemic respiratory failure on mechanical ventilation Septic shock however no obvious source of infection has been found I am concerned that his new set of fever and elevated white count could possibly be from previous Foley or line.  These have both been removed.  Foley was replaced by urology. A. fib with RVR AKI on CKD. Hyperglycemia  Plan: Wean from ventilator as tolerated. Needs additional volume off of him before consideration of mechanical vent wean. He does have a negative fluid balance but is +3 L since yesterday. Restart Lasix 40 mg IV twice daily Antibiotics switched from cefepime to meropenem Complete 5-day  course of antimicrobials. MRSA swab if negative stop vancomycin  This patient is critically ill with multiple organ system failure; which, requires frequent high complexity decision making, assessment, support, evaluation, and titration of therapies. This was completed through the application of advanced monitoring technologies and extensive interpretation of multiple databases. During this encounter critical care time was devoted to patient care services described in this note for 32 minutes.    Garner Nash, DO [facility] 05/20/2021 2:59 PM

## 2021-05-20 NOTE — Progress Notes (Signed)
Inpatient Diabetes Program Recommendations  AACE/ADA: New Consensus Statement on Inpatient Glycemic Control (2015)  Target Ranges:  Prepandial:   less than 140 mg/dL      Peak postprandial:   less than 180 mg/dL (1-2 hours)      Critically ill patients:  140 - 180 mg/dL   Lab Results  Component Value Date   GLUCAP 174 (H) 05/20/2021   HGBA1C 5.9 (H) 05/09/2021    Review of Glycemic Control Results for WESTON, KALLMAN (MRN [id number]) as of 05/20/2021 10:36  Ref. Range 05/20/2021 06:11 05/20/2021 08:16 05/20/2021 10:22  Glucose-Capillary Latest Ref Range: 70 - 99 mg/dL 175 (H) 165 (H) 174 (H)   Diabetes history: Type 2 DM Outpatient Diabetes medications: Farxiga 10 mg qd Current orders for Inpatient glycemic control: IV insulin  Inpatient Diabetes Program Recommendations:    When ready to transition, consider: Levemir 50 units two hours prior to discontinuation, then BID to follow Novolog 12 units Q4H  Novolog 3-9 Q4H under ICU glycemic control order set  Thanks, Bronson Curb, MSN, RNC-OB Diabetes Coordinator [phone] (8a-5p)

## 2021-05-20 NOTE — Progress Notes (Signed)
[place] for  heparin  Indication: afib  Allergies  Allergen Reactions   Tussionex Pennkinetic Er [Hydrocod Polst-Cpm Polst Er] Other (See Comments)    UNSPECIFIED REACTION  > "caused prostate problems"   Ace Inhibitors Cough    Patient Measurements: Height: 5\' 7"  (170.2 cm) Weight: 126.2 kg (278 lb 3.5 oz) IBW/kg (Calculated) : 66.1 Heparin Dosing Weight: 125kg  Vital Signs: Temp: 100.4 F (38 C) (06/10 0800) Temp Source: Esophageal (06/10 0800) BP: 130/74 (06/10 1017) Pulse Rate: 131 (06/10 1030)  Labs: Recent Labs    05/18/21 0424 05/18/21 0735 05/19/21 0514 05/19/21 1049 05/20/21 0402  HGB 12.9*  --  13.2  --  12.6*  HCT 42.6  --  42.9  --  42.0  PLT 108*  --  145*  --  170  HEPARINUNFRC 0.39  --  0.55  --  0.68  CREATININE  --  1.63*  --  1.64* 1.83*     Estimated Creatinine Clearance: 43.8 mL/min (A) (by C-G formula based on SCr of 1.83 mg/dL (H)).  Assessment: 75 yo male with h/o Afib, Xarelto on hold, new thrombocytopenia on heparin and concern for HIT so was switched to argatroban. Heparin antibody was negative, so switched back to heparin on 05/14/21.  Heparin level (0.68) therapeutic, on drip rate 1300 units/hr although level trending up on this rate. CBC stable    Goal of Therapy:  aPTT 50-90 seconds Monitor platelets by anticoagulation protocol: Yes   Plan:  Reduce IV heparin to 1250 units/hr. Daily heparin level and CBC.  [place] Crane, Roylene Reason, [facility] Clinical Pharmacist  05/20/2021 10:46 AM   [facility] pharmacy phone numbers are listed on [email or web address]

## 2021-05-21 LAB — GLUCOSE, CAPILLARY
Glucose-Capillary: 137 mg/dL — ABNORMAL HIGH (ref 70–99)
Glucose-Capillary: 146 mg/dL — ABNORMAL HIGH (ref 70–99)
Glucose-Capillary: 249 mg/dL — ABNORMAL HIGH (ref 70–99)
Glucose-Capillary: 239 mg/dL — ABNORMAL HIGH (ref 70–99)
Glucose-Capillary: 187 mg/dL — ABNORMAL HIGH (ref 70–99)
Glucose-Capillary: 174 mg/dL — ABNORMAL HIGH (ref 70–99)
Glucose-Capillary: 173 mg/dL — ABNORMAL HIGH (ref 70–99)
Glucose-Capillary: 200 mg/dL — ABNORMAL HIGH (ref 70–99)
Glucose-Capillary: 160 mg/dL — ABNORMAL HIGH (ref 70–99)
Glucose-Capillary: 160 mg/dL — ABNORMAL HIGH (ref 70–99)

## 2021-05-21 LAB — CBC
HCT: 38 % — ABNORMAL LOW (ref 39.0–52.0)
Hemoglobin: 11.5 g/dL — ABNORMAL LOW (ref 13.0–17.0)
MCH: 31.3 pg (ref 26.0–34.0)
MCHC: 30.3 g/dL (ref 30.0–36.0)
MCV: 103.3 fL — ABNORMAL HIGH (ref 80.0–100.0)
Platelets: 163 10*3/uL (ref 150–400)
RBC: 3.68 MIL/uL — ABNORMAL LOW (ref 4.22–5.81)
RDW: 13.9 % (ref 11.5–15.5)
WBC: 18.8 10*3/uL — ABNORMAL HIGH (ref 4.0–10.5)
nRBC: 0 % (ref 0.0–0.2)

## 2021-05-21 LAB — BASIC METABOLIC PANEL
Anion gap: 11 (ref 5–15)
Anion gap: 8 (ref 5–15)
BUN: 84 mg/dL — ABNORMAL HIGH (ref 8–23)
BUN: 85 mg/dL — ABNORMAL HIGH (ref 8–23)
CO2: 19 mmol/L — ABNORMAL LOW (ref 22–32)
CO2: 19 mmol/L — ABNORMAL LOW (ref 22–32)
Calcium: 8.2 mg/dL — ABNORMAL LOW (ref 8.9–10.3)
Calcium: 7.9 mg/dL — ABNORMAL LOW (ref 8.9–10.3)
Chloride: 111 mmol/L (ref 98–111)
Chloride: 114 mmol/L — ABNORMAL HIGH (ref 98–111)
Creatinine, Ser: 1.79 mg/dL — ABNORMAL HIGH (ref 0.61–1.24)
Creatinine, Ser: 1.63 mg/dL — ABNORMAL HIGH (ref 0.61–1.24)
GFR, Estimated: 39 mL/min — ABNORMAL LOW (ref >60)
GFR, Estimated: 43 mL/min — ABNORMAL LOW (ref >60)
Glucose, Bld: 143 mg/dL — ABNORMAL HIGH (ref 70–99)
Glucose, Bld: 198 mg/dL — ABNORMAL HIGH (ref 70–99)
Potassium: 4.9 mmol/L (ref 3.5–5.1)
Potassium: 4.9 mmol/L (ref 3.5–5.1)
Sodium: 141 mmol/L (ref 135–145)
Sodium: 141 mmol/L (ref 135–145)

## 2021-05-21 LAB — URINE CULTURE: Culture: NO GROWTH

## 2021-05-21 LAB — HEPARIN LEVEL (UNFRACTIONATED): Heparin Unfractionated: 0.44 IU/mL (ref 0.30–0.70)

## 2021-05-21 LAB — MAGNESIUM: Magnesium: 2.9 mg/dL — ABNORMAL HIGH (ref 1.7–2.4)

## 2021-05-21 MED ORDER — SODIUM CHLORIDE 0.9 % IV SOLN
250.0000 mL | INTRAVENOUS | Status: DC
  Administered 2021-05-21: 500 mL via INTRAVENOUS

## 2021-05-21 MED ORDER — METOPROLOL TARTRATE 5 MG/5ML IV SOLN
2.5000 mg | INTRAVENOUS | Status: DC | PRN
  Administered 2021-05-21: 5 mg via INTRAVENOUS
  Administered 2021-05-21: 2.5 mg via INTRAVENOUS
  Filled 2021-05-21 (×2): qty 5

## 2021-05-21 MED ORDER — PHENYLEPHRINE HCL-NACL 10-0.9 MG/250ML-% IV SOLN
25.0000 ug/min | INTRAVENOUS | Status: DC
  Administered 2021-05-21: 85 ug/min via INTRAVENOUS
  Administered 2021-05-21: 25 ug/min via INTRAVENOUS
  Administered 2021-05-22: 40 ug/min via INTRAVENOUS
  Administered 2021-05-22: 35 ug/min via INTRAVENOUS
  Administered 2021-05-22: 65 ug/min via INTRAVENOUS
  Filled 2021-05-21 (×4): qty 250

## 2021-05-21 MED ORDER — AMIODARONE IV BOLUS ONLY 150 MG/100ML: 150.0000 mg | Freq: Once | INTRAVENOUS | Status: DC

## 2021-05-21 MED ORDER — DILTIAZEM HCL 25 MG/5ML IV SOLN
15.0000 mg | Freq: Once | INTRAVENOUS | Status: AC
  Administered 2021-05-21: 15 mg via INTRAVENOUS
  Filled 2021-05-21: qty 5

## 2021-05-21 MED ORDER — METOPROLOL TARTRATE 5 MG/5ML IV SOLN: 2.5000 mg | Freq: Four times a day (QID) | INTRAVENOUS | Status: DC

## 2021-05-21 MED ORDER — AMIODARONE LOAD VIA INFUSION
150.0000 mg | Freq: Once | INTRAVENOUS | Status: AC
  Administered 2021-05-21: 150 mg via INTRAVENOUS
  Filled 2021-05-21: qty 83.34

## 2021-05-21 MED ORDER — PHENYLEPHRINE HCL-NACL 10-0.9 MG/250ML-% IV SOLN: 0.0000 ug/min | INTRAVENOUS | Status: DC

## 2021-05-21 MED ORDER — DILTIAZEM HCL-DEXTROSE 125-5 MG/125ML-% IV SOLN (PREMIX)
5.0000 mg/h | INTRAVENOUS | Status: DC
  Administered 2021-05-21: 5 mg/h via INTRAVENOUS
  Administered 2021-05-22: 12.5 mg/h via INTRAVENOUS
  Filled 2021-05-21 (×4): qty 125

## 2021-05-21 MED ORDER — ESMOLOL HCL-SODIUM CHLORIDE 2000 MG/100ML IV SOLN
25.0000 ug/kg/min | INTRAVENOUS | Status: DC
  Administered 2021-05-21: 25 ug/kg/min via INTRAVENOUS
  Filled 2021-05-21: qty 100

## 2021-05-21 MED ORDER — METOPROLOL TARTRATE 25 MG/10 ML ORAL SUSPENSION
25.0000 mg | Freq: Three times a day (TID) | ORAL | Status: DC
  Filled 2021-05-21: qty 10

## 2021-05-21 MED ORDER — METOPROLOL TARTRATE 25 MG/10 ML ORAL SUSPENSION
25.0000 mg | Freq: Three times a day (TID) | ORAL | Status: DC
  Administered 2021-05-21 – 2021-05-22 (×2): 25 mg
  Filled 2021-05-21 (×2): qty 10

## 2021-05-21 NOTE — Progress Notes (Addendum)
RN called Elink MD, in regards to poorly Aline readings in comparison to Noninvasive BP readings. Pt HR is responding well to Cardizem however, pt is needing more Neo for BP support. At this time Dr Gillermina Phy gave verbal orders to decrease Cardizem drip to 7.5mg /hr.   Orders being placed for Amio Bolus and orders to increase Amio Drip. Per Dr Gillermina Phy, try to titrate down off Cardizem aggressively as long as BP permits. Goal at this time to to get the patient off Cardizem since he is needing for BP support. Will follow up with Elink MD in a hour per Dr Shirley Muscat request.

## 2021-05-21 NOTE — Progress Notes (Signed)
NAME:  [name], MRN:  [id number], DOB:  08/18/1946, LOS: 12 ADMISSION DATE:  04/21/2021, CONSULTATION DATE:  05/09/2021 REFERRING MD:  Dr. Randal Buba, CHIEF COMPLAINT:  SOB  History of Present Illness:  [name] is a 75 yo M with past medical hx as below presenting from home with complaints of SOB/ respiratory distress on 5/29.  Treated by EMS with duonebs, epi, solumedrol, and mag without improvement, requiring BVM.  On arrival to ER, patient required intubation as he was peri- arrest, unresponsive, bradycardic, temp 95.9, hypotensive, and apneic with diffuse apical wheeze requiring emergent intubation on arrival.  Noted to have pink frothy secretions on intubation.  PCCM called for admit.   Pertinent  Medical History  Morbid obesity, HFrEF (12/24/20 EF 40-45%, G2DD, PAF, mildly reduced systolic RV, mild MR), VT on amio/ ICD, CAD s/p CABG 1996, LBBB, HLD, COPD, OSA on CPAP, AAA (s/p repair 2015), NSCL lung CA s/p XRT 2018, anemia, BPH, DMT2, GERD, hiatal hernia, PVD, tremor  Significant Hospital Events: Including procedures, antibiotic start and stop dates in addition to other pertinent events   5/30 admitted for respiratory failure/ intubated, empiric CAP coverage/ duresis 6/1 BLLE Duplex> no DVT, unable to complete thoracentesis due to anatomy  6/2 Tmax 101, -2.9L in last 24 hours, on levo at 26mcg, precedex. Resp culture negative. BC pending. 6/3 switched from heparin to argatroban for thrombocytopenia, HIT ab pending 6/4 HIT AB negative, switched back to heparin, failed SBT due to tachypnea 6/7 afib w/ RVR, HR increased abruptly to 140s from 90s, received amio bolus and started on drip, got fentanyl and versed, started cefepime and vanco, d/c norepi, receiving vasopressin, precedex, phenylephrine 6/9 - Stopped cefepime and started meropenem, continued vanco due to concern for cefepime- associated drug rash  Interim History / Subjective:   Patient remains critically ill intubated on  mechanical life support  Objective   Blood pressure (!) 95/59, pulse (!) 142, temperature 98.7 F (37.1 C), resp. rate (!) 24, height 5\' 7"  (1.702 m), weight 128.6 kg, SpO2 97 %.    Vent Mode: PRVC FiO2 (%):  [40 %] 40 % Set Rate:  [24 bmp] 24 bmp Vt Set:  [530 mL] 530 mL PEEP:  [5 cmH20] 5 cmH20 Pressure Support:  [5 cmH20] 5 cmH20 Plateau Pressure:  [18 cmH20-26 cmH20] 18 cmH20   Intake/Output Summary (Last 24 hours) at 05/21/2021 0753 Last data filed at 05/21/2021 0600 Gross per 24 hour  Intake 4745.92 ml  Output 3270 ml  Net 1475.92 ml   Net for admission -7.9 L  Filed Weights   05/19/21 0500 05/20/21 0400 05/21/21 0500  Weight: 123 kg 126.2 kg 128.6 kg    Examination: General: Critically ill intubated on mechanical life support HEENT: Endotracheal tube in place Neuro: Opens eyes to voice, attempts to track  CV: Regular rate rhythm, S1-S2 Extremities: Bilateral upper and lower extremity dependent edema PULM: Bilateral rhonchi, bilateral mechanically ventilated breath sounds Skin: Edema in the upper extremities, pitting, bruising   Labs/imaging that I havepersonally reviewed  (right click and "Reselect all SmartList Selections" daily)   Sodium 141 Glucose 143 BUN 84 Creatinine 1.79 White blood cell count 18.8, improving   Resolved Hospital Problem list    Assessment & Plan:   Acute hypoxic/ hypercarbic respiratory failure  -Patient had multifocal infiltrates, procalcitonin was elevated Concern for pneumonia however that was treated developed higher fever concern for potential secondary infection with line Or Foley which were both removed and exchanged Plan: Complete 7-day  total course of antibiotics has been switched to meropenem MRSA swab repeat negative, vancomycin stopped Continue to monitor temperature and fever curve no additional fevers at this time. He does have a positive cumulative fluid balance for a day as of yesterday, restarted Lasix Continue  to follow urine output with diuresis Wean from vent as tolerated I feel like he needs to have better control of his A. fib RVR before we consider extubation.  HFrEF (12/24/20 EF 40-45%, G2DD, mildly reduced systolic RV, mild MR) >> LVEF down to 20-25% this admission - Hx of VT on amio w/ICD, CAD s/p CABG 1996, LBBB, HTN, PAF Atrial fibrillation with RVR Plan: Continue heparin Continue amiodarone Increased oral metoprolol 25 mg, every 8 hours  CKD 3b, hx BPH, phimosis on PTA, finasteride  - Phimosis required bedside dilation by urology for foley placement 5/30; Remove foley 6/8 due to concern for infection; replaced 6/9 by urology -Plan: Continue follow kidney function appears stable at this time  Thrombocytopenia 4T score= 4, intermediate risk for HIT, although B- lactams can also decrease platelets. HIT Ab negative Plan: Continue heparin, follow  Hyperglycemia, uncontrolled - Hx T2DM, A1C 5.9 05/09/21 Plan: Continue insulin infusion Goal blood glucose 140-180  Constipation Continue bowel regimen As needed soapsuds enema he did have a bowel movement on 6/10  Hypernatremia, improving   Plan: Continue free water flush maintain sodium level  Chronic Conditions:   Hypothyroidism, stable - Con't synthroid  Class 3 obesity, stable - Education when able   Baseline anxiety disorder - Klonopin BID    I spoke with patient's daughter at bedside this morning.   Best practice (right click and "Reselect all SmartList Selections" daily)  Diet:  TF Pain/Anxiety/Delirium protocol (if indicated): Yes (RASS goal 0) VAP protocol (if indicated): Yes DVT prophylaxis: Systemic AC GI prophylaxis: PPI Glucose control:  Basal bolus, SSI Central venous access:  Yes, and it is still needed Arterial line:  no Foley:  No, removed 6/9 Mobility:  bed rest  PT consulted: [name] Last date of multidisciplinary goals of care discussion:  Code Status:  full code Disposition: ICU   This  patient is critically ill with multiple organ system failure; which, requires frequent high complexity decision making, assessment, support, evaluation, and titration of therapies. This was completed through the application of advanced monitoring technologies and extensive interpretation of multiple databases. During this encounter critical care time was devoted to patient care services described in this note for 32 minutes.  Garner Nash, DO [facility] 05/21/2021 7:53 AM

## 2021-05-21 NOTE — Progress Notes (Addendum)
eLink Physician-Brief Progress Note Patient Name: [name] DOB: [birth year] MRN: [id number]   Date of Service  05/21/2021  HPI/Events of Note  Patient has had difficult to control Afib with RVR this admission. Currently intubated/ventilated for COPD exacerbation.  He was amio loaded and started on amio infusion on 6/7. Remains on amio infusion at 0.5 mg/min currently.  Due to refractory Afib with RVR on amiodarone drip, cardizem drip is also infusing at 10 mg/hr currently. Current ventricular rate with these two infusions is ~110 bpm.  However, cardizem infusion is resulting in hypotension and phenylephrine drip requirement (currently at rate of 75). Despite this, A-line reading 90/40 (MAP 54) with reasonable waveform (albeit some dampening present).   eICU Interventions  Wean down cardizem drip and instead we will re-load amiodarone (150 mg IV + amio infusion at 1 mg/min for 6 hours).  Goal HR <\= 120 bpm, but occasional bursts higher than this are acceptable.  Will attempt to wean pressors throughout this cross-titration.    ADDENDUM 05/21/21 10:35 PM  - After amiodarone load, the patient is now in ventricular trigeminy at a rate of ~100 bpm. BP has somewhat improved in this rhythm. RN is continuing to come off cardizem drip. - Continue plan as outlined above. We will also check a BMP + Mg since he is also due for a diuretic dose soon.  ADDENDUM 05/22/21 1:19 AM  - Patient back in Afib with RVR to 130-140 bpm, albeit he is off Neo at this time and his MAP is in 70s by both A-line and NIBP at this time. - Ordered digoxin 0.5mg  IV x1 dose for additional AV nodal blockade.  Intervention Category Intermediate Interventions: Arrhythmia - evaluation and management  Marily Lente Lamika Connolly 05/21/2021, 10:10 PM

## 2021-05-21 NOTE — Progress Notes (Signed)
[place] for  heparin  Indication: afib  Allergies  Allergen Reactions   Tussionex Pennkinetic Er [Hydrocod Polst-Cpm Polst Er] Other (See Comments)    UNSPECIFIED REACTION  > "caused prostate problems"   Ace Inhibitors Cough    Patient Measurements: Height: 5\' 7"  (170.2 cm) Weight: 128.6 kg (283 lb 8.2 oz) IBW/kg (Calculated) : 66.1 Heparin Dosing Weight: 125kg  Vital Signs: Temp: 98.7 F (37.1 C) (06/11 0700) Temp Source: Axillary (06/11 0400) BP: 98/53 (06/11 0900) Pulse Rate: 142 (06/11 0900)  Labs: Recent Labs    05/19/21 0514 05/19/21 1049 05/20/21 0402 05/21/21 0500  HGB 13.2  --  12.6* 11.5*  HCT 42.9  --  42.0 38.0*  PLT 145*  --  170 163  HEPARINUNFRC 0.55  --  0.68 0.44  CREATININE  --  1.64* 1.83* 1.79*     Estimated Creatinine Clearance: 45.2 mL/min (A) (by C-G formula based on SCr of 1.79 mg/dL (H)).  Assessment: 75 yo male with h/o Afib, Xarelto on hold, new thrombocytopenia on heparin and concern for HIT so was switched to argatroban. Heparin antibody was negative, so switched back to heparin on 05/14/21.  Heparin level (0.44) therapeutic, on drip rate 1250 units/hr. CBC stable    Goal of Therapy:  aPTT 50-90 seconds Monitor platelets by anticoagulation protocol: Yes   Plan:  Continue IV heparin 1250 units/hr. Daily heparin level and CBC.  [place] Crane, Roylene Reason, BCCP Clinical Pharmacist  05/21/2021 9:36 AM   [facility] pharmacy phone numbers are listed on [email or web address]

## 2021-05-22 ENCOUNTER — Inpatient Hospital Stay: Payer: Self-pay

## 2021-05-22 ENCOUNTER — Inpatient Hospital Stay (HOSPITAL_COMMUNITY): Payer: Medicare Other

## 2021-05-22 LAB — GLUCOSE, CAPILLARY
Glucose-Capillary: 118 mg/dL — ABNORMAL HIGH (ref 70–99)
Glucose-Capillary: 122 mg/dL — ABNORMAL HIGH (ref 70–99)
Glucose-Capillary: 123 mg/dL — ABNORMAL HIGH (ref 70–99)
Glucose-Capillary: 134 mg/dL — ABNORMAL HIGH (ref 70–99)
Glucose-Capillary: 139 mg/dL — ABNORMAL HIGH (ref 70–99)
Glucose-Capillary: 146 mg/dL — ABNORMAL HIGH (ref 70–99)
Glucose-Capillary: 155 mg/dL — ABNORMAL HIGH (ref 70–99)
Glucose-Capillary: 158 mg/dL — ABNORMAL HIGH (ref 70–99)
Glucose-Capillary: 158 mg/dL — ABNORMAL HIGH (ref 70–99)
Glucose-Capillary: 159 mg/dL — ABNORMAL HIGH (ref 70–99)
Glucose-Capillary: 159 mg/dL — ABNORMAL HIGH (ref 70–99)
Glucose-Capillary: 162 mg/dL — ABNORMAL HIGH (ref 70–99)
Glucose-Capillary: 167 mg/dL — ABNORMAL HIGH (ref 70–99)
Glucose-Capillary: 169 mg/dL — ABNORMAL HIGH (ref 70–99)
Glucose-Capillary: 169 mg/dL — ABNORMAL HIGH (ref 70–99)
Glucose-Capillary: 173 mg/dL — ABNORMAL HIGH (ref 70–99)
Glucose-Capillary: 176 mg/dL — ABNORMAL HIGH (ref 70–99)
Glucose-Capillary: 176 mg/dL — ABNORMAL HIGH (ref 70–99)
Glucose-Capillary: 177 mg/dL — ABNORMAL HIGH (ref 70–99)
Glucose-Capillary: 179 mg/dL — ABNORMAL HIGH (ref 70–99)
Glucose-Capillary: 189 mg/dL — ABNORMAL HIGH (ref 70–99)
Glucose-Capillary: 192 mg/dL — ABNORMAL HIGH (ref 70–99)
Glucose-Capillary: 196 mg/dL — ABNORMAL HIGH (ref 70–99)
Glucose-Capillary: 204 mg/dL — ABNORMAL HIGH (ref 70–99)
Glucose-Capillary: 206 mg/dL — ABNORMAL HIGH (ref 70–99)
Glucose-Capillary: 210 mg/dL — ABNORMAL HIGH (ref 70–99)
Glucose-Capillary: 218 mg/dL — ABNORMAL HIGH (ref 70–99)
Glucose-Capillary: 219 mg/dL — ABNORMAL HIGH (ref 70–99)
Glucose-Capillary: 222 mg/dL — ABNORMAL HIGH (ref 70–99)
Glucose-Capillary: 232 mg/dL — ABNORMAL HIGH (ref 70–99)
Glucose-Capillary: 234 mg/dL — ABNORMAL HIGH (ref 70–99)
Glucose-Capillary: 285 mg/dL — ABNORMAL HIGH (ref 70–99)

## 2021-05-22 LAB — POCT I-STAT 7, (LYTES, BLD GAS, ICA,H+H)
Acid-base deficit: 6 mmol/L — ABNORMAL HIGH (ref 0.0–2.0)
Bicarbonate: 18.7 mmol/L — ABNORMAL LOW (ref 20.0–28.0)
Calcium, Ion: 1.21 mmol/L (ref 1.15–1.40)
HCT: 34 % — ABNORMAL LOW (ref 39.0–52.0)
Hemoglobin: 11.6 g/dL — ABNORMAL LOW (ref 13.0–17.0)
O2 Saturation: 96 %
Patient temperature: 38
Potassium: 4.9 mmol/L (ref 3.5–5.1)
Sodium: 143 mmol/L (ref 135–145)
TCO2: 20 mmol/L — ABNORMAL LOW (ref 22–32)
pCO2 arterial: 35.1 mmHg (ref 32.0–48.0)
pH, Arterial: 7.339 — ABNORMAL LOW (ref 7.350–7.450)
pO2, Arterial: 91 mmHg (ref 83.0–108.0)

## 2021-05-22 LAB — CBC
HCT: 35.7 % — ABNORMAL LOW (ref 39.0–52.0)
Hemoglobin: 10.9 g/dL — ABNORMAL LOW (ref 13.0–17.0)
MCH: 31.2 pg (ref 26.0–34.0)
MCHC: 30.5 g/dL (ref 30.0–36.0)
MCV: 102.3 fL — ABNORMAL HIGH (ref 80.0–100.0)
Platelets: 168 10*3/uL (ref 150–400)
RBC: 3.49 MIL/uL — ABNORMAL LOW (ref 4.22–5.81)
RDW: 14.1 % (ref 11.5–15.5)
WBC: 14.5 10*3/uL — ABNORMAL HIGH (ref 4.0–10.5)
nRBC: 0 % (ref 0.0–0.2)

## 2021-05-22 LAB — COOXEMETRY PANEL
Carboxyhemoglobin: 0.6 % (ref 0.5–1.5)
Carboxyhemoglobin: 0.7 % (ref 0.5–1.5)
Methemoglobin: 1.1 % (ref 0.0–1.5)
Methemoglobin: 1.1 % (ref 0.0–1.5)
O2 Saturation: 81.7 %
O2 Saturation: 83.3 %
Total hemoglobin: 12.9 g/dL (ref 12.0–16.0)
Total hemoglobin: 13.8 g/dL (ref 12.0–16.0)

## 2021-05-22 LAB — BASIC METABOLIC PANEL
Anion gap: 9 (ref 5–15)
BUN: 84 mg/dL — ABNORMAL HIGH (ref 8–23)
CO2: 18 mmol/L — ABNORMAL LOW (ref 22–32)
Calcium: 7.9 mg/dL — ABNORMAL LOW (ref 8.9–10.3)
Chloride: 113 mmol/L — ABNORMAL HIGH (ref 98–111)
Creatinine, Ser: 1.61 mg/dL — ABNORMAL HIGH (ref 0.61–1.24)
GFR, Estimated: 44 mL/min — ABNORMAL LOW (ref >60)
Glucose, Bld: 205 mg/dL — ABNORMAL HIGH (ref 70–99)
Potassium: 5 mmol/L (ref 3.5–5.1)
Sodium: 140 mmol/L (ref 135–145)

## 2021-05-22 LAB — HEPARIN LEVEL (UNFRACTIONATED)
Heparin Unfractionated: 0.13 IU/mL — ABNORMAL LOW (ref 0.30–0.70)
Heparin Unfractionated: 0.28 IU/mL — ABNORMAL LOW (ref 0.30–0.70)
Heparin Unfractionated: 0.31 IU/mL (ref 0.30–0.70)

## 2021-05-22 MED ORDER — FUROSEMIDE 10 MG/ML IJ SOLN: 40.0000 mg | Freq: Three times a day (TID) | INTRAMUSCULAR | Status: DC

## 2021-05-22 MED ORDER — AMIODARONE HCL IN DEXTROSE 360-4.14 MG/200ML-% IV SOLN
60.0000 mg/h | INTRAVENOUS | Status: DC
  Administered 2021-05-22: 60 mg/h via INTRAVENOUS

## 2021-05-22 MED ORDER — FUROSEMIDE 10 MG/ML IJ SOLN
80.0000 mg | Freq: Three times a day (TID) | INTRAMUSCULAR | Status: DC
  Administered 2021-05-22 – 2021-05-24 (×5): 80 mg via INTRAVENOUS
  Filled 2021-05-22 (×5): qty 8

## 2021-05-22 MED ORDER — AMIODARONE LOAD VIA INFUSION
150.0000 mg | Freq: Once | INTRAVENOUS | Status: AC
  Administered 2021-05-22: 150 mg via INTRAVENOUS
  Filled 2021-05-22: qty 83.34

## 2021-05-22 MED ORDER — NOREPINEPHRINE 4 MG/250ML-% IV SOLN
0.0000 ug/min | INTRAVENOUS | Status: DC
  Administered 2021-05-23: 2 ug/min via INTRAVENOUS
  Administered 2021-05-23: 3 ug/min via INTRAVENOUS
  Administered 2021-05-24: 15 ug/min via INTRAVENOUS
  Filled 2021-05-22 (×3): qty 250

## 2021-05-22 MED ORDER — LACTATED RINGERS IV BOLUS
500.0000 mL | Freq: Once | INTRAVENOUS | Status: AC
  Administered 2021-05-22: 500 mL via INTRAVENOUS

## 2021-05-22 MED ORDER — SODIUM CHLORIDE 0.9% FLUSH
10.0000 mL | Freq: Two times a day (BID) | INTRAVENOUS | Status: DC
  Administered 2021-05-23: 10 mL

## 2021-05-22 MED ORDER — DIGOXIN 0.25 MG/ML IJ SOLN
0.5000 mg | Freq: Once | INTRAMUSCULAR | Status: AC
  Administered 2021-05-22: 0.5 mg via INTRAVENOUS
  Filled 2021-05-22: qty 2

## 2021-05-22 MED ORDER — AMIODARONE HCL IN DEXTROSE 360-4.14 MG/200ML-% IV SOLN
30.0000 mg/h | INTRAVENOUS | Status: DC
  Administered 2021-05-22 – 2021-05-23 (×7): 60 mg/h via INTRAVENOUS
  Administered 2021-05-24: 30 mg/h via INTRAVENOUS
  Administered 2021-05-24: 60 mg/h via INTRAVENOUS
  Administered 2021-05-24 – 2021-05-31 (×14): 30 mg/h via INTRAVENOUS
  Filled 2021-05-22 (×23): qty 200

## 2021-05-22 MED ORDER — SODIUM CHLORIDE 0.9% FLUSH: 10.0000 mL | INTRAVENOUS | Status: DC | PRN

## 2021-05-22 NOTE — Progress Notes (Signed)
NAME:  [name], MRN:  [id number], DOB:  [birth year], LOS: 71 ADMISSION DATE:  05/03/2021, CONSULTATION DATE:  05/09/2021 REFERRING MD:  Dr. Randal Buba, CHIEF COMPLAINT:  SOB  History of Present Illness:  [name] is a 75 yo M with past medical hx as below presenting from home with complaints of SOB/ respiratory distress on 5/29.  Treated by EMS with duonebs, epi, solumedrol, and mag without improvement, requiring BVM.  On arrival to ER, patient required intubation as he was peri- arrest, unresponsive, bradycardic, temp 95.9, hypotensive, and apneic with diffuse apical wheeze requiring emergent intubation on arrival.  Noted to have pink frothy secretions on intubation.  PCCM called for admit.   Pertinent  Medical History  Morbid obesity, HFrEF (12/24/20 EF 40-45%, G2DD, PAF, mildly reduced systolic RV, mild MR), VT on amio/ ICD, CAD s/p CABG 1996, LBBB, HLD, COPD, OSA on CPAP, AAA (s/p repair 2015), NSCL lung CA s/p XRT 2018, anemia, BPH, DMT2, GERD, hiatal hernia, PVD, tremor  Significant Hospital Events: Including procedures, antibiotic start and stop dates in addition to other pertinent events   5/30 admitted for respiratory failure/ intubated, empiric CAP coverage/ duresis 6/1 BLLE Duplex> no DVT, unable to complete thoracentesis due to anatomy  6/2 Tmax 101, -2.9L in last 24 hours, on levo at 43mcg, precedex. Resp culture negative. BC pending. 6/3 switched from heparin to argatroban for thrombocytopenia, HIT ab pending 6/4 HIT AB negative, switched back to heparin, failed SBT due to tachypnea 6/7 afib w/ RVR, HR increased abruptly to 140s from 90s, received amio bolus and started on drip, got fentanyl and versed, started cefepime and vanco, d/c norepi, receiving vasopressin, precedex, phenylephrine 6/9 - Stopped cefepime and started meropenem, continued vanco due to concern for cefepime- associated drug rash 6/12 unable to extubate. Afib RVR worse  Interim History / Subjective:    Patient remains critically ill intubated on life support.  A. fib RVR remains an issue.  Objective   Blood pressure (!) 94/49, pulse (!) 131, temperature 97.88 F (36.6 C), resp. rate (!) 35, height 5\' 7"  (1.702 m), weight 128.9 kg, SpO2 97 %.    Vent Mode: PRVC FiO2 (%):  [40 %] 40 % Set Rate:  [24 bmp] 24 bmp Vt Set:  [530 mL] 530 mL PEEP:  [5 cmH20] 5 cmH20 Pressure Support:  [8 cmH20] 8 cmH20 Plateau Pressure:  [21 cmH20-29 cmH20] 26 cmH20   Intake/Output Summary (Last 24 hours) at 05/22/2021 0958 Last data filed at 05/22/2021 4081 Gross per 24 hour  Intake 4695.82 ml  Output 3090 ml  Net 1605.82 ml    Filed Weights   05/20/21 0400 05/21/21 0500 05/22/21 0500  Weight: 126.2 kg 128.6 kg 128.9 kg    Examination: General: elderly  male, intubated on mechanical life support critically ill HEENT: ETT in place  Neuro: opens eyes, attempts to track CV: irregular tachy , s1 s2  Extremities: no edema  PULM: diminished bilaterally, bl vented breaths  Skin: edema present, dependent   Labs/imaging that I havepersonally reviewed  (right click and "Reselect all SmartList Selections" daily)   Sodium 140 Potassium 5 BUN 84  Scr 1.61 WBC14.5   Resolved Hospital Problem list    Assessment & Plan:   Acute hypoxic/ hypercarbic respiratory failure  -Patient had multifocal infiltrates, procalcitonin was elevated Concern for pneumonia however that was treated developed higher fever concern for potential secondary infection with line Or Foley which were both removed and exchanged Plan: Complete 7-day course of antibiotics,  meropenem Vancomycin stopped. No additional fevers, lines and Foley exchanged.  HFrEF (12/24/20 EF 40-45%, G2DD, mildly reduced systolic RV, mild MR) >> LVEF down to 20-25% this admission - Hx of VT on amio w/ICD, CAD s/p CABG 1996, LBBB, HTN, PAF Atrial fibrillation with RVR Plan: Continue heparin Continue amiodarone His ejection fraction is down. His  A. fib RVR is harder to control. He is responding to diuresis. Ordered PICC line to check coox. Consult placed to advanced heart failure service. Consideration for TEE cardioversion?  CKD 3b, hx BPH, phimosis on PTA, finasteride  - Phimosis required bedside dilation by urology for foley placement 5/30; Remove foley 6/8 due to concern for infection; replaced 6/9 by urology -Plan: Follow kidney function and UOP  Thrombocytopenia 4T score= 4, intermediate risk for HIT, although B- lactams can also decrease platelets. HIT Ab negative Plan: Continue to follow   Hyperglycemia, uncontrolled - Hx T2DM, A1C 5.9 05/09/21 Plan: Insulin infusion Goal CBG 140-180  Constipation Continue bowel regimen As needed soapsuds enema he did have a bowel movement on 6/10 PRNs for movement if needed   Hypernatremia, improving   Plan: Free water held   Chronic Conditions:   Hypothyroidism, stable - Con't synthroid  Class 3 obesity, stable - Education when able   Baseline anxiety disorder - Klonopin BID    I spoke with patient's daughter at bedside this morning.   Best practice (right click and "Reselect all SmartList Selections" daily)  Diet:  TF Pain/Anxiety/Delirium protocol (if indicated): Yes (RASS goal 0) VAP protocol (if indicated): Yes DVT prophylaxis: Systemic AC GI prophylaxis: PPI Glucose control:  Basal bolus, SSI Central venous access:  Yes, and it is still needed Arterial line:  no Foley:  No, removed 6/9 Mobility:  bed rest  PT consulted: [name] Last date of multidisciplinary goals of care discussion:  Code Status:  full code Disposition: ICU  This patient is critically ill with multiple organ system failure; which, requires frequent high complexity decision making, assessment, support, evaluation, and titration of therapies. This was completed through the application of advanced monitoring technologies and extensive interpretation of multiple databases. During this  encounter critical care time was devoted to patient care services described in this note for 33 minutes.  Garner Nash, DO [facility] 05/22/2021 9:58 AM

## 2021-05-22 NOTE — Plan of Care (Signed)
Asked to assess patient at bedside.  Patient was tachypneic, tachycardic, and hypotensive.  Was in A. fib with RVR in the 140s.  Was on moderate dose of Neo-Synephrine.  Also on diltiazem.  Patient had difficult to discern temperatures as he was placed on a Bair hugger and then became febrile of 38 which then normalized to 37 after removal of Bair hugger.  Also patient with leukocytosis of 18,000.  This is improved from 22,000 however still elevated.  Patient remains on meropenem.  Last dose was vancomycin on 6/9.  Also had blood cultures within the last 48 hours as well.  Given the above we will not broaden antibiotics at this time.  Patient given fentanyl bolus and respiratory rate and heart rate did seem to improve.  However we will do the following.  -Initiate fentanyl infusion -500 cc bolus of fluids for potential evolving shock.  Bedside POCUS with collapsible IVC and dilated left atrium and ventricle  with poor EF -Follow-up cultures -Amiodarone bolus of 150 given.  On reassessment at 30 minutes after bolus of fluids, initiation of fentanyl, and amiodarone heart rate and respiratory rate had improved.  If heart rate increases again would give another 150 mg of amiodarone  Newell Coral DO Internal Medicine/Pediatrics Pulmonary and Critical Care Fellow PGY-7

## 2021-05-22 NOTE — Progress Notes (Signed)
RN given orders to titrate down off Neo tolerated.

## 2021-05-22 NOTE — Progress Notes (Signed)
[place] for  heparin  Indication: afib  Allergies  Allergen Reactions   Tussionex Pennkinetic Er [Hydrocod Polst-Cpm Polst Er] Other (See Comments)    UNSPECIFIED REACTION  > "caused prostate problems"   Ace Inhibitors Cough    Patient Measurements: Height: 5\' 7"  (170.2 cm) Weight: 128.9 kg (284 lb 2.8 oz) IBW/kg (Calculated) : 66.1 Heparin Dosing Weight: 125kg  Vital Signs: Temp: 97.88 F (36.6 C) (06/12 1400) Temp Source: Esophageal (06/12 0800) BP: 106/52 (06/12 1400) Pulse Rate: 134 (06/12 1400)  Labs: Recent Labs    05/20/21 0402 05/21/21 0500 05/21/21 2241 05/22/21 0246 05/22/21 0425 05/22/21 1337  HGB 12.6* 11.5*  --  11.6* 10.9*  --   HCT 42.0 38.0*  --  34.0* 35.7*  --   PLT 170 163  --   --  168  --   HEPARINUNFRC 0.68 0.44  --   --  0.13* 0.28*  CREATININE 1.83* 1.79* 1.63*  --  1.61*  --      Estimated Creatinine Clearance: 50.4 mL/min (A) (by C-G formula based on SCr of 1.61 mg/dL (H)).  Assessment: 75 yo male with h/o Afib, Xarelto on hold, new thrombocytopenia on heparin and concern for HIT so was switched to argatroban. Heparin antibody was negative, so switched back to heparin on 05/14/21.  Heparin level down to subtherapeutic (0.28) on drip at 1500 units/hr. Hgb 11.6, plt stable. No issues with bleeding reported per RN. Pt reports that heparin was paused for about 15 min for a fluid bolus. Pt had been stable on this dose for past couple of days.   Goal of Therapy:  Heparin level 0.3-0.7 units/ml Monitor platelets by anticoagulation protocol: Yes   Plan:  Increase IV heparin to 1500 units/hr. Recheck heparin level in 8 hrs. Daily heparin level and CBC.  [place] Crane, Roylene Reason, BCCP Clinical Pharmacist  05/22/2021 3:18 PM   [facility] pharmacy phone numbers are listed on [email or web address]

## 2021-05-22 NOTE — Progress Notes (Signed)
PIV consult: Significant edema noted in BUE. RN reports previous infiltration in L forearm. RUE with midline and forearm PIV infusing.  [place] PIV placed in L upper arm. Pt has limited vascular access options due to fever and suspected infection, please avoid BP cuff over IV sites.

## 2021-05-22 NOTE — Consult Note (Addendum)
Advanced Heart Failure Team Consult Note   Primary Physician: [name], MD PCP-Cardiologist:  None HF cardiologist: [name]   Reason for Consultation: Atrial fibrillation & cardiogenic shock  HPI:    [name] is seen today for evaluation of atrial fibrillation/cardiogenic shock at the request of Dr. Valeta Harms.   [name] is a 75 y.o. male with morbid obesity, systolic heart failure due to CM with an EF of 30-35% (January 2014) -> 20-25% (March 2015 - RV normal), VT on amio, CAD s/p CABG 1996, LBBB, COPD, obstructive sleep apnea on CPAP, AAA repair (January 2015), RUL lung nodule treated with XRT last in 9/20.    Echo in 7/20 showed EF 35-40%, basal to mid inferolateral and inferior akinesis, mildly decreased RV systolic function, mild AS, PASP 22 mmHg.     Patient went into atrial fibrillation in 11/21 and was cardioverted back to NSR in 12/21.  Echo in 1/22 showed EF 40-45% with inferolateral and basal inferior akinesis, mild MR, mild RV dilation with mildly decreased RV systolic function, normal IVC.   Last seen in clinic 04/19/21. Volume was up. ECG with NSR with AV pacing.   Called EMS on 05/09/21 with worsening SOB On arrival to ER had diffuse wheezes and severe resp distress. Became apneic and bradycardiac. Intubated urgently and found to have pink frothy sputum c/w HF however BNP only 281. Hstrop 92 -> 276. ECG on arrival was AF with v-pacing  Has remained intubated since admission. Developed fevers and thrombocytopenia. Felt to be septic and have PNA. however PCT max only 0.21. Treated with broad spectrum abx.   Echo 6/2 with EF 20-25% mild MR.  RV ok. Has been on/off NE. Placed on IV amio (started 6/7) and heparin. Heparin switched to argatroban but HIT panel came back negative so switched back to heparin.   Abx broadened due to fever 103 recently. Cefepime stopped due to possible rash. Now on Vanc/mero. Was febrile again to 38.0 yesterday. More tachycardic with  HRs in 140s despite IV amio. Placed on neosynephrine.   Remains intubated/sedated. Failed SBT today   Review of Systems: Unavailable due to intubation     Home Medications Prior to Admission medications   Medication Sig Start Date End Date Taking? Authorizing Provider  albuterol (PROVENTIL HFA;VENTOLIN HFA) 108 (90 BASE) MCG/ACT inhaler Inhale 2 puffs into the lungs every 6 (six) hours as needed for wheezing or shortness of breath. 12/25/14  Yes [name], MD  albuterol (PROVENTIL) (2.5 MG/3ML) 0.083% nebulizer solution USE 1 VIAL IN NEBULIZER 4 TIMES DAILY Patient taking differently: Take 2.5 mg by nebulization 4 (four) times daily. 12/30/20  Yes Young, Tarri Fuller D, MD  amiodarone (PACERONE) 200 MG tablet Take 1 tablet (200 mg total) by mouth daily. 12/17/20  Yes Larey Dresser, MD  atorvastatin (LIPITOR) 80 MG tablet TAKE 1 TABLET BY MOUTH EVERY DAY Patient taking differently: Take 80 mg by mouth daily. 01/31/21  Yes Larey Dresser, MD  ATROVENT HFA 17 MCG/ACT inhaler Inhale 2 puffs into the lungs every 4 (four) hours as needed for wheezing.  11/13/18  Yes [provider]  carvedilol (COREG) 12.5 MG tablet Take 1 tablet (12.5 mg total) by mouth 2 (two) times daily with a meal. 04/21/21  Yes Lyda Jester M, PA-C  dapagliflozin propanediol (FARXIGA) 10 MG TABS tablet Take 1 tablet (10 mg total) by mouth daily before breakfast. 06/29/20  Yes Larey Dresser, MD  diazepam (VALIUM) 5 MG tablet  Take 1 tablet (5 mg total) by mouth every 8 (eight) hours as needed for anxiety. 1-2 tabs every 8 hours if needed for tremor 03/23/20  Yes [name], MD  ezetimibe (ZETIA) 10 MG tablet TAKE 1 TABLET BY MOUTH  DAILY Patient taking differently: Take 10 mg by mouth daily. 02/21/21  Yes Larey Dresser, MD  finasteride (PROSCAR) 5 MG tablet TAKE 1 TABLET BY MOUTH  DAILY Patient taking differently: Take 5 mg by mouth daily. 04/13/21  Yes Irine Seal, MD  fluticasone [facility]) 50 MCG/ACT  nasal spray Instill 1 spray in each  nostril daily Patient taking differently: Place 1 spray into both nostrils daily as needed for allergies. 06/01/16  Yes Larey Dresser, MD  Fluticasone-Umeclidin-Vilant (TRELEGY ELLIPTA) 100-62.5-25 MCG/INH AEPB Inhale 1 puff into the lungs daily. Rinse mouth 05/31/20  Yes Young, Tarri Fuller D, MD  icosapent Ethyl (VASCEPA) 1 g capsule TAKE 2 CAPSULES (2 G TOTAL) BY MOUTH 2 (TWO) TIMES A DAY. Patient taking differently: Take 2 g by mouth 2 (two) times daily. 02/21/21  Yes Barnet Benavides, Shaune Pascal, MD  isosorbide mononitrate (IMDUR) 60 MG 24 hr tablet TAKE 1.5 TABLETS (90 MG TOTAL) BY MOUTH DAILY. Patient taking differently: Take 90 mg by mouth daily. 04/14/21  Yes Maysa Lynn, Shaune Pascal, MD  levothyroxine (SYNTHROID) 50 MCG tablet TAKE 1 TABLET BY MOUTH  DAILY BEFORE BREAKFAST Patient taking differently: Take 50 mcg by mouth daily before breakfast. 05/02/20  Yes [name], MD  Multiple Vitamins-Minerals (MULTIVITAMIN WITH MINERALS) tablet Take 1 tablet by mouth daily.   Yes [provider]  Multiple Vitamins-Minerals (PRESERVISION AREDS 2 PO) Take 1 capsule by mouth at bedtime.   Yes [provider]  nitroGLYCERIN (NITROSTAT) 0.4 MG SL tablet PLACE 1 TABLET UNDER THE TONGUE EVERY 5 MINUTES AS NEEDED FOR CHEST PAIN. Patient taking differently: Place 0.4 mg under the tongue every 5 (five) minutes as needed for chest pain. 04/18/21  Yes Larey Dresser, MD  NON FORMULARY See admin instructions. CPAP At bedtime And during the day as needed when napping   Yes [provider]  OXYGEN Inhale 2.5-3 L into the lungs continuous.   Yes [provider]  potassium chloride SA (KLOR-CON M20) 20 MEQ tablet Take 1.5 tablets (30 mEq total) by mouth daily. 05/05/21  Yes Lyda Jester M, PA-C  Rivaroxaban (XARELTO) 15 MG TABS tablet Take 1 tablet (15 mg total) by mouth daily with supper. 08/10/20  Yes Larey Dresser, MD  sacubitril-valsartan  (ENTRESTO) 49-51 MG Take 1 tablet by mouth 2 (two) times daily.   Yes [provider]  sacubitril-valsartan (ENTRESTO) 97-103 MG Take 1 tablet by mouth 2 (two) times daily. 04/21/21  Yes Lyda Jester M, PA-C  spironolactone (ALDACTONE) 25 MG tablet TAKE 1 TABLET BY MOUTH  DAILY Patient taking differently: Take 25 mg by mouth daily. 04/14/21  Yes Larey Dresser, MD  torsemide (DEMADEX) 20 MG tablet Take 3 tablets (60 mg total) by mouth in the morning AND 2 tablets (40 mg total) every evening. 04/19/21  Yes Rosita Fire, Brittainy M, PA-C  traMADol (ULTRAM) 50 MG tablet Take 1 tablet (50 mg total) by mouth every 6 (six) hours as needed. Patient taking differently: Take 50 mg by mouth every 6 (six) hours as needed for moderate pain. 07/14/20  Yes [name], MD    Past Medical History: Past Medical History:  Diagnosis Date   AAA (abdominal aortic aneurysm) ([place])    followed by Dr.  Chen   Adenomatous colon polyp 01/2004   Anemia    hx   Automatic implantable cardioverter-defibrillator in situ    AVM (arteriovenous malformation)    BPH (benign prostatic hypertrophy)    CAD (coronary artery disease)    s/p CABGx2 in 1996   Carotid artery stenosis    LCEA - Dr. Bridgett Larsson in 2013   CHF (congestive heart failure) (HCC)    Complication of anesthesia    claustrophobic, unabe to lie on back more than 4 hours at time due to back   COPD (chronic obstructive pulmonary disease) ([place])    Diabetes mellitus    DIET CONTROLLED- pt states that this was a misdiagnosis, he was treated while in the hospital  but returned home, loss a massive amount of weight and he has not had a problem with his blood sugar since. States everything has been normal for about 3 years.   Diverticulosis    Dyspnea    Dysrhythmia    ICD-defibrillator   Fatigue    GERD (gastroesophageal reflux disease)    H/O hiatal hernia    History of radiation therapy 04/09/17-04/17/17   SBRT right lung 54 Gy in 3 fractions   HLD  (hyperlipidemia)    Hypertension    Hypothyroidism    Ischemic cardiomyopathy    Myocardial infarction ([place])    NSCL ca 2018   recurrence x 3   OSA on CPAP    AHI durign total sleep 14.69/hr, during REM 50.91/hr   Peripheral vascular disease ([place])    LCEA, L renal artery stent, bilat iliac stents, R SFA stenosis   Tremor    Ventricular tachycardia ([place]) 09/09/2014   Amiodarone was started after appropriate defibrillator shocks for ventricular tachycardia in October 2008    Past Surgical History: Past Surgical History:  Procedure Laterality Date   ABDOMINAL AORTIC ENDOVASCULAR STENT GRAFT [name] 01/06/2014   Procedure: ABDOMINAL AORTIC ENDOVASCULAR STENT GRAFT- GORE; ULTRASOUND GUIDED;  Surgeon: Conrad , MD;  Location: [place];  Service: Vascular;  Laterality: [name];   ANGIOPLASTY     BILATERAL  LE  W/STENTS   BI-VENTRICULAR IMPLANTABLE CARDIOVERTER DEFIBRILLATOR UPGRADE [name] 10/09/2014   Procedure: BI-VENTRICULAR IMPLANTABLE CARDIOVERTER DEFIBRILLATOR UPGRADE;  Surgeon: Evans Lance, MD;  Location: [facility] CATH LAB;  Service: Cardiovascular;  Laterality: [name];   BIV ICD GENERTAOR CHANGE OUT  10/09/2014   UPGRADE TO BIV        BY DR Lovena Le   CARDIAC CATHETERIZATION  09/16/2007   occlusion of both vein grafts, no significant LAD disease or diagonal disease, Cfx collaterals from the left, 70% in-stent restenosis of L renal artery, normal L main, RCA occluded ostially (Dr. Adora Fridge)   CARDIAC CATHETERIZATION  10/03/2002   SVG sequentially to OM1 & OM2 totally occluded at ostium, SVG to PDA totally occluded within previously placed prox vein graft stent, smal distal AAA, bialt iliac stents with 30% left end-stent restenosis and 50% right end-stent restnosis(Dr. Gerrie Nordmann)   CARDIAC CATHETERIZATION  06/11/1998   L main with 20% narrowing in distal 1/3; LAD with 1st diagonla having 70% ostial narrowing, 2nd diagonal with 40% narrowing in prox third, LIMA & RIMA widely patent; in-stent restenosis of  RCIA with successful PTA and new prox SVTRCA stent for residual disease (Dr. Marella Chimes)   [place]  06/2005   Guidant Vitality HE - ischemic cardiomyopathy - Dr. Marella Chimes   CARDIOVERSION [name] 11/19/2020   Procedure: CARDIOVERSION;  Surgeon: Larey Dresser, MD;  Location: [facility];  Service: Cardiovascular;  Laterality: [name];   CAROTID ENDARTERECTOMY Left 01/04/12   CORONARY ANGIOPLASTY  01/08/2004   cutting balloon atherectomy & percutaneous intervention of RCIA in-stent restenosis (Dr. Marella Chimes)   CORONARY ARTERY BYPASS GRAFT  11/04/1985   x2 - PDA and sequential DX-OM (Dr. Redmond Pulling)   ENDARTERECTOMY  01/04/2012   Procedure: ENDARTERECTOMY CAROTID;left  Surgeon: Hinda Lenis, MD;  Location: [place];  Service: Vascular;  Laterality: Left;  with patch angioplasty   FUDUCIAL PLACEMENT [name] 02/23/2017   Procedure: PLACEMENT OF FUDUCIAL;  Surgeon: [name], MD;  Location: [place];  Service: Thoracic;  Laterality: [name];   ICD GENERATOR CHANGE  05/02/2010   [employer] Teligen   ILIAC ARTERY STENT Bilateral 03/1997   and L SFA PTA   Iron infusion  June 16, 2012   LEFT HEART CATHETERIZATION WITH CORONARY ANGIOGRAM [name] 07/06/2014   Procedure: LEFT HEART CATHETERIZATION WITH CORONARY ANGIOGRAM;  Surgeon: Peter M Martinique, MD;  Location: [facility] CATH LAB;  Service: Cardiovascular;  Laterality: [name];   NM MYOCAR PERF WALL MOTION  09/2012   lexiscan myoview - mod-severe perfusion defect r/t infarct or scar w/mild periinfarct ishcemia in basal inferior, mid inferior, apical inferior, basal inferolateral & mid inferoalteral regions - EF 21% low risk scan   POLYPECTOMY     RENAL ARTERY STENT Left 03/24/2004   6x53m Genesis stent (Dr. RMarella Chimes   RIGHT/LEFT HEART CATH AND CORONARY ANGIOGRAPHY [name] 12/28/2017   Procedure: RIGHT/LEFT HEART CATH AND CORONARY ANGIOGRAPHY;  Surgeon: MLarey Dresser MD;  [facility];  Service: Cardiovascular;   Laterality: [name];   TRANSTHORACIC ECHOCARDIOGRAM  12/2012   EF 30-35; LV mod to severely dilated, mod concentric hypertrophy, severe hypokinesis of inferolateral myocardium, moderate hypokineis of anteroseptal region, grade 1 diastolic dysfunction; mod MR; LA mod-severely dialted; RV mod dialted; RA mildly dilated; PA peak pressure 329mg   VIDEO BRONCHOSCOPY WITH ENDOBRONCHIAL NAVIGATION [name] 02/23/2017   Procedure: VIDEO BRONCHOSCOPY WITH ENDOBRONCHIAL NAVIGATION;  Surgeon: StMelrose NakayamaMD;  [place] [facility] Service: Thoracic;  Laterality: [name];    Family History: Family History  Problem Relation Age of Onset   Lung cancer Mother    Cancer Mother    Diabetes Mother    Hypertension Mother    Hyperlipidemia Mother    Heart disease Mother        before age 755 Heart attack Father    Heart disease Father        before age 72 Hypertension Father    Colon cancer Sister    Cancer Sister    Hypertension Sister    Hyperlipidemia Sister    Parkinson's disease Sister    Diabetes Sister    Heart disease Sister        before age 721 Heart attack Maternal Grandmother    Diabetes Daughter     Social History: Social History   Socioeconomic History   Marital status: Married    Spouse name: Not on file   Number of children: 2   Years of education: Not on file   Highest education level: Not on file  Occupational History   Occupation: retired poSolicitor Tobacco Use   Smoking status: Former    Packs/day: 1.50    Years: 40.00    Pack years: 60.00    Types: Cigarettes, Pipe    Quit date: 12/11/2002    Years since  quitting: 18.4   Smokeless tobacco: Never   Tobacco comments:    Pt reports smoking 1 pack/day before quitting in 2004  Vaping Use   Vaping Use: Never used  Substance and Sexual Activity   Alcohol use: No    Alcohol/week: 0.0 standard drinks   Drug use: No   Sexual activity: Not Currently  Other Topics Concern   Not on file  Social History  Narrative   Not on file   Social Determinants of Health   Financial Resource Strain: Medium Risk   Difficulty of Paying Living Expenses: Somewhat hard  Food Insecurity: Not on file  Transportation Needs: Not on file  Physical Activity: Not on file  Stress: Not on file  Social Connections: Not on file    Allergies:  Allergies  Allergen Reactions   Tussionex Pennkinetic Er [Hydrocod Polst-Cpm Polst Er] Other (See Comments)    UNSPECIFIED REACTION  > "caused prostate problems"   Ace Inhibitors Cough    Objective:    Vital Signs:   Temp:  [93.2 F (34 C)-101.1 F (38.4 C)] 98.24 F (36.8 C) (06/12 1700) Pulse Rate:  [46-147] 134 (06/12 1700) Resp:  [23-41] 38 (06/12 1700) BP: (81-155)/(41-119) 92/66 (06/12 1700) SpO2:  [95 %-99 %] 97 % (06/12 1700) Arterial Line BP: (51-159)/(36-74) 113/48 (06/12 1700) FiO2 (%):  [40 %] (P) 40 % (06/12 1600) Weight:  [128.9 kg] 128.9 kg (06/12 0500) Last BM Date: 05/22/21  Weight change: Filed Weights   05/20/21 0400 05/21/21 0500 05/22/21 0500  Weight: 126.2 kg 128.6 kg 128.9 kg    Intake/Output:   Intake/Output Summary (Last 24 hours) at 05/22/2021 1840 Last data filed at 05/22/2021 1600 Gross per 24 hour  Intake 3058.77 ml  Output 2430 ml  Net 628.77 ml      Physical Exam    General:  Intubated awake with eyes open but not following commands HEENT: normal + ETT Neck: supple. Unable to assess JVP due to size Carotids 2+ bilat; no bruits. No lymphadenopathy or thyromegaly appreciated. Cor: PMI nondisplaced. Irreg tachy  Lungs: coarse Abdomen: obese soft, nontender, nondistended. No hepatosplenomegaly. No bruits or masses. Good bowel sounds. Extremities: no cyanosis, clubbing, rash, 2-3+ edema Neuro:  as above  Telemetry   AF 140s with vpacing Personally reviewed   EKG    AF with VPacing Personally reviewed   Labs   Basic Metabolic Panel: Recent Labs  Lab 05/17/21 0210 05/17/21 1803 05/18/21 0424  05/18/21 0735 05/19/21 1049 05/20/21 0402 05/21/21 0500 05/21/21 2241 05/22/21 0246 05/22/21 0425  NA 145 148*  --  146* 144 142 141 141 143 140  K 4.1 4.1  --  4.7 4.5 5.1 4.9 4.9 4.9 5.0  CL 111 113*  --  112* 114* 112* 111 114*  --  113*  CO2 26 25  --  23 18* 19* 19* 19*  --  18*  GLUCOSE 220* 185*  --  241* 302* 198* 143* 198*  --  205*  BUN 92* 85*  --  79* 60* 76* 84* 85*  --  84*  CREATININE 1.74* 1.53*  --  1.63* 1.64* 1.83* 1.79* 1.63*  --  1.61*  CALCIUM 8.6* 8.2*  --  8.5* 8.2* 8.5* 8.2* 7.9*  --  7.9*  MG 2.9* 2.9* 2.7*  --   --   --   --  2.9*  --   --   PHOS  --   --   --  4.0  --   --   --   --   --   --  Liver Function Tests: Recent Labs  Lab 05/19/21 1049  AST 38  ALT 37  ALKPHOS 35*  BILITOT 0.7  PROT 5.9*  ALBUMIN 2.6*   No results for input(s): LIPASE, AMYLASE in the last 168 hours. No results for input(s): AMMONIA in the last 168 hours.  CBC: Recent Labs  Lab 05/18/21 0424 05/19/21 0514 05/20/21 0402 05/21/21 0500 05/22/21 0246 05/22/21 0425  WBC 15.4* 17.6* 22.2* 18.8*  --  14.5*  HGB 12.9* 13.2 12.6* 11.5* 11.6* 10.9*  HCT 42.6 42.9 42.0 38.0* 34.0* 35.7*  MCV 102.9* 101.4* 104.0* 103.3*  --  102.3*  PLT 108* 145* 170 163  --  168    Cardiac Enzymes: No results for input(s): CKTOTAL, CKMB, CKMBINDEX, TROPONINI in the last 168 hours.  BNP: BNP (last 3 results) Recent Labs    05/01/2021 2322 05/14/21 0500 05/16/21 0340  BNP 281.2* 216.4* 78.5    ProBNP (last 3 results) No results for input(s): PROBNP in the last 8760 hours.   CBG: Recent Labs  Lab 05/22/21 1008 05/22/21 1217 05/22/21 1335 05/22/21 1403 05/22/21 1648  GLUCAP 232* 173* 159* 162* 176*    Coagulation Studies: No results for input(s): LABPROT, INR in the last 72 hours.   Imaging   DG CHEST PORT 1 VIEW  Result Date: 05/22/2021 CLINICAL DATA:  Respiratory distress, COPD, unresponsive EXAM: PORTABLE CHEST 1 VIEW COMPARISON:  Radiograph 05/18/2021  FINDINGS: Endotracheal tube tip terminates in the mid trachea, 4.5 cm from the carina. Transesophageal tube tip and side port distal to the GE junction. Prior sternotomy and CABG. Left chest wall battery pack with pacer/defibrillator leads at the right atrium and cardiac apex as well as within the coronary sinus. Patient is imaged in a steep right anterior obliquity superimposing much of the cardiac silhouette over the left heart. Stable cardiomediastinal contours. Chronic scarring and opacity in the right mid to upper lung with associated bronchiectatic changes. Biapical pleuroparenchymal scarring and apical pleural thickening is unchanged from comparison accounting for differences in positioning. There is some persistent left basilar opacity which is fairly similar to prior. Some slightly increasing pulmonary vascular congestion and hazy interstitial opacity noted. No pneumothorax. No discernible layering pleural effusion. IMPRESSION: 1. Lines and tubes as above 2. Increasing pulmonary vascular congestion and hazy interstitial opacities along could reflect some slightly worsening pulmonary edema. 3. Retrocardiac opacity is again noted, possibly atelectatic or other pleuroparenchymal process. 4. Chronic scarring in the right mid to upper lung and bilateral apices. Electronically Signed   By: Lovena Le M.D.   On: 05/22/2021 03:23   [place] EKG SITE RITE  Result Date: 05/22/2021 If Site Rite image not attached, placement could not be confirmed due to current cardiac rhythm.    Medications:     Current Medications:  acetaminophen (TYLENOL) oral liquid 160 mg/5 mL  650 mg Per Tube Q6H   arformoterol  15 mcg Nebulization BID   aspirin  81 mg Per Tube Daily   atorvastatin  80 mg Per Tube Daily   bethanechol  10 mg Per Tube TID   budesonide (PULMICORT) nebulizer solution  0.5 mg Nebulization BID   chlorhexidine gluconate (MEDLINE KIT)  15 mL Mouth Rinse BID   Chlorhexidine Gluconate Cloth  6 each Topical  Q0600   clonazePAM  1 mg Per Tube BID   docusate  100 mg Per Tube BID   feeding supplement (PROSource TF)  90 mL Per Tube TID   furosemide  40 mg Intravenous Q8H   Gerhardt's  butt cream   Topical QID   levothyroxine  50 mcg Per Tube Q0600   mouth rinse  15 mL Mouth Rinse 10 times per day   metoprolol tartrate  25 mg Per Tube Q8H   pantoprazole sodium  40 mg Per Tube Daily   polyethylene glycol  17 g Per Tube Daily   senna  1 tablet Per Tube Daily   sodium chloride flush  10-40 mL Intracatheter Q12H   sodium chloride flush  10-40 mL Intracatheter Q12H   sodium chloride flush  3 mL Intravenous Q12H    Infusions:  sodium chloride 20 mL/hr at 05/21/21 1840   sodium chloride     sodium chloride     sodium chloride 20 mL/hr at 05/22/21 0900   amiodarone 60 mg/hr (05/22/21 1407)   dexmedetomidine (PRECEDEX) IV infusion 0.1 mcg/kg/hr (05/22/21 0628)   feeding supplement (VITAL 1.5 CAL) 1,000 mL (05/22/21 0214)   fentaNYL infusion INTRAVENOUS 50 mcg/hr (05/22/21 1314)   heparin 1,500 Units/hr (05/22/21 1505)   insulin 13 Units/hr (05/22/21 1727)      Assessment/Plan   Shock - suspect combination of septic shock and cardiogenic shock in setting of recurrent AF -  Echo 05/12/21 EF 20-25% (previous Echo /20 EF 35-40%) - Remains febrile with WBC 18.8 -> 14.5k PCT 0.21 on vanc/mero - Bcx NGTD - Resp cx - rare GPC  - Suspect cardiogenic shock playing a prominent role - Place central access. Follow CVP and co-ox. Stop lopressor - Use NE for BP/inotropic support as needed - Will need TEE & DCCV prior to extubation as AF likely driving his severe HF  2. Acute on chronic systolic HF due to iCM  - s/p BSci CRT-D - Echo 05/12/21 EF 20-25% (previous Echo /20 EF 35-40%) - Suspect tachy induced CM in setting of recurrent AF given only minimal bump in hstrop - Last cath 1/19 (LAD is patent, other vessels occluded with collaterals from the LAD. SVG to OM and SVG to PDA chronically occluded) - Can  consider repeat cath as needed but doubt ischemia is driving factor - central access. Follow CVP & Co-ox  - He is volume overloaded. Dry weight seems to be 270-274. He is 284 today - inotropes as needed. Diurese  3. Recurrent AF with RVR - remains tachy despite - rebolus. Continue gtt - continue heparin - TEE/DC-CV soon. Possibly tomorrow if co-ox and volume stable but suspect it may be another day or two (Keep intubated)  - If unable to maintain NSR can consider AVN ablation as he already has CRT  4. Acute hypercapenic/hypoxic respiratory failure - remains on vent - CCM managing - Failed SBT   5. CKD IV - baseline creatinine 1.3-1.8 - stable at 1.6 today  6. CAD - h/o CABG withg occluded grafts - Last cath 1/19 (LAD is patent, other vessels occluded with collaterals from the LAD. SVG to OM and SVG to PDA chronically occluded) - continue ASA/statin  - Can consider repeat cath as needed but doubt ischemia is driving factor  7. Thrombocytopenia - resolved.  - HIT negative  8. DM2 - per CCM   9. Urinary retention/hematuria - Foley had to be placed by Urology - suspect local Foley trauma - follow   CRITICAL CARE Performed by: [name]  Total critical care time: 60 minutes  Critical care time was exclusive of separately billable procedures and treating other patients.  Critical care was necessary to treat or prevent imminent or life-threatening deterioration.  Critical care  was time spent personally by me (independent of midlevel providers or residents) on the following activities: development of treatment plan with patient and/or surrogate as well as nursing, discussions with consultants, evaluation of patient's response to treatment, examination of patient, obtaining history from patient or surrogate, ordering and performing treatments and interventions, ordering and review of laboratory studies, ordering and review of radiographic studies, pulse oximetry and  re-evaluation of patient's condition.     Length of Stay: 13  [name], MD  05/22/2021, 6:40 PM  Advanced Heart Failure Team Pager [phone] (M-F; 7a - 5p)  Please contact [facility] for night-coverage after hours (4p -7a ) and weekends on [email or web address]

## 2021-05-22 NOTE — Progress Notes (Signed)
Aaron Edelman RN notified PICC is ready to use per CXR results.  Routine order placed.  Aaron Edelman RN aware to remove PIV's and change tubing.

## 2021-05-22 NOTE — Progress Notes (Signed)
CVP 11  Coox 81% (rechecked)   Suspect septic component.   BP stable now. He is quite edematous.   Will increase Lasix to 80 iv.   If BP drops use NE.   Follow CVP and coox.   Pierre Bali, MD

## 2021-05-22 NOTE — Progress Notes (Addendum)
eLink Physician-Brief Progress Note Patient Name: ZAUL HUBERS DOB: [birth year] MRN: [id number]   Date of Service  05/22/2021  HPI/Events of Note  Patient now febrile to 38.0 C. He was cultured about 48 hours ago after spiking to 103 F. WBC 18.8k yesterday.  He's also more tachypenic on the vent in setting of this fever. HR is now sinus tachycardia at 140 bpm. BP 136/74 (90) on Neo @ 45 mcg/min.  He is being treated with vanc/meropenem already. Not on steroids. He has been off steroids since 6/3.  ABG 7.34/35/91/19/BE -6.   eICU Interventions  I have asked the bedside team to come to assess the patient, including consideration of a volume challenge.   ADDENDUM 05/22/21 6:43 AM  - Received request to renew amiodarone drip. Currently at 1 mg/min. - HR remains 137 bpm. - Renewed amiodarone drip. No additional bolus as this time. Patient was already reloaded x2 overnight.   Intervention Category Intermediate Interventions: Hypotension - evaluation and management;Infection - evaluation and management  Marily Lente Daijah Scrivens 05/22/2021, 3:01 AM

## 2021-05-22 NOTE — Progress Notes (Signed)
[place] for  heparin  Indication: afib  Allergies  Allergen Reactions   Tussionex Pennkinetic Er [Hydrocod Polst-Cpm Polst Er] Other (See Comments)    UNSPECIFIED REACTION  > "caused prostate problems"   Ace Inhibitors Cough    Patient Measurements: Height: 5\' 7"  (170.2 cm) Weight: 128.6 kg (283 lb 8.2 oz) IBW/kg (Calculated) : 66.1 Heparin Dosing Weight: 125kg  Vital Signs: Temp: 98.1 F (36.7 C) (06/12 0430) BP: 149/114 (06/12 0350) Pulse Rate: 112 (06/12 0430)  Labs: Recent Labs    05/20/21 0402 05/21/21 0500 05/21/21 2241 05/22/21 0246 05/22/21 0425  HGB 12.6* 11.5*  --  11.6* 10.9*  HCT 42.0 38.0*  --  34.0* 35.7*  PLT 170 163  --   --  168  HEPARINUNFRC 0.68 0.44  --   --  0.13*  CREATININE 1.83* 1.79* 1.63*  --   --      Estimated Creatinine Clearance: 49.7 mL/min (A) (by C-G formula based on SCr of 1.63 mg/dL (H)).  Assessment: 75 yo male with h/o Afib, Xarelto on hold, new thrombocytopenia on heparin and concern for HIT so was switched to argatroban. Heparin antibody was negative, so switched back to heparin on 05/14/21.  Heparin level down to subtherapeutic (0.13) on drip at 1250 units/hr. Hgb down to 10.9, plt stable. No issues with bleeding reported per RN. Pt reports that heparin was paused for about 15 min for a fluid bolus. Pt had been stable on this dose for past couple of days.   Goal of Therapy:  Heparin level 0.3-0.7 units/ml Monitor platelets by anticoagulation protocol: Yes   Plan:  Increase IV heparin to 1300 units/hr. Will f/u 8 hr heparin level  Sherlon Handing, PharmD, BCPS Please see amion for complete clinical pharmacist phone list  05/22/2021 5:02 AM

## 2021-05-22 NOTE — Progress Notes (Signed)
RT NOTE:  New ALINE order entered. Patients ALINE in left radial is correlating fine at this time since blood pressure cuff was removed.

## 2021-05-22 NOTE — Progress Notes (Signed)
Peripherally Inserted Central Catheter Placement  The IV Nurse has discussed with the patient and/or persons authorized to consent for the patient, the purpose of this procedure and the potential benefits and risks involved with this procedure.  The benefits include less needle sticks, lab draws from the catheter, and the patient may be discharged home with the catheter. Risks include, but not limited to, infection, bleeding, blood clot (thrombus formation), and puncture of an artery; nerve damage and irregular heartbeat and possibility to perform a PICC exchange if needed/ordered by physician.  Alternatives to this procedure were also discussed.  B[employer] Power PICC patient education guide, fact sheet on infection prevention and patient information card has been provided to patient /or left at bedside. Telephone consent obtained by Harvin Hazel.   PICC Placement Documentation  PICC Triple Lumen 07/37/10 PICC Right Basilic 43 cm 0 cm (Active)  Indication for Insertion or Continuance of Line Administration of hyperosmolar/irritating solutions (i.e. TPN, Vancomycin, etc.);Vasoactive infusions;Limited venous access - need for IV therapy >5 days (PICC only);Poor Vasculature-patient has had multiple peripheral attempts or PIVs lasting less than 24 hours;Prolonged intravenous therapies;Chronic illness with exacerbations (CF, Sickle Cell, etc.) 05/22/21 1830  Exposed Catheter (cm) 0 cm 05/22/21 1830  Site Assessment Clean;Dry;Intact 05/22/21 1830  Lumen #1 Status Flushed;Saline locked;Blood return noted 05/22/21 1830  Lumen #2 Status Flushed;Saline locked;Blood return noted 05/22/21 1830  Lumen #3 Status Flushed;Saline locked;Blood return noted 05/22/21 1830  Dressing Type Transparent 05/22/21 1830  Dressing Status Clean;Dry;Intact 05/22/21 1830  Antimicrobial disc in place? Yes 05/22/21 1830  Safety Lock Not Applicable 62/69/48 [id number]  Line Care Connections checked and tightened 05/22/21 1830  Line Adjustment  (NICU/IV Team Only) No 05/22/21 1830  Dressing Intervention New dressing 05/22/21 1830  Dressing Change Due 05/29/21 05/22/21 1830       Rolena Infante 05/22/2021, 6:32 PM

## 2021-05-22 NOTE — Progress Notes (Signed)
[place] for  heparin  Indication: afib  Labs: Recent Labs    05/20/21 0402 05/21/21 0500 05/21/21 2241 05/22/21 0246 05/22/21 0425 05/22/21 1337 05/22/21 2309  HGB 12.6* 11.5*  --  11.6* 10.9*  --   --   HCT 42.0 38.0*  --  34.0* 35.7*  --   --   PLT 170 163  --   --  168  --   --   HEPARINUNFRC 0.68 0.44  --   --  0.13* 0.28* 0.31  CREATININE 1.83* 1.79* 1.63*  --  1.61*  --   --     Assessment: 75 yo male with h/o Afib, Xarelto on hold, new thrombocytopenia on heparin and concern for HIT so was switched to argatroban. Heparin antibody was negative, so switched back to heparin on 05/14/21.  Heparin level 0.31 units/ml   Goal of Therapy:  Heparin level 0.3-0.7 units/ml Monitor platelets by anticoagulation protocol: Yes   Plan:  Continue IV heparin at 1500 units/hr. Daily heparin level and CBC.  Thanks for allowing pharmacy to be a part of this patient's care.  Excell Seltzer, PharmD Clinical Pharmacist

## 2021-05-23 ENCOUNTER — Inpatient Hospital Stay (HOSPITAL_COMMUNITY): Payer: Medicare Other

## 2021-05-23 DIAGNOSIS — N179 Acute kidney failure, unspecified: Secondary | ICD-10-CM

## 2021-05-23 DIAGNOSIS — J9690 Respiratory failure, unspecified, unspecified whether with hypoxia or hypercapnia: Secondary | ICD-10-CM

## 2021-05-23 DIAGNOSIS — I5023 Acute on chronic systolic (congestive) heart failure: Secondary | ICD-10-CM

## 2021-05-23 DIAGNOSIS — I4892 Unspecified atrial flutter: Secondary | ICD-10-CM

## 2021-05-23 DIAGNOSIS — I4819 Other persistent atrial fibrillation: Secondary | ICD-10-CM

## 2021-05-23 DIAGNOSIS — I340 Nonrheumatic mitral (valve) insufficiency: Secondary | ICD-10-CM

## 2021-05-23 DIAGNOSIS — I359 Nonrheumatic aortic valve disorder, unspecified: Secondary | ICD-10-CM

## 2021-05-23 LAB — CBC
HCT: 33.7 % — ABNORMAL LOW (ref 39.0–52.0)
Hemoglobin: 10.3 g/dL — ABNORMAL LOW (ref 13.0–17.0)
MCH: 31.6 pg (ref 26.0–34.0)
MCHC: 30.6 g/dL (ref 30.0–36.0)
MCV: 103.4 fL — ABNORMAL HIGH (ref 80.0–100.0)
Platelets: 161 10*3/uL (ref 150–400)
RBC: 3.26 MIL/uL — ABNORMAL LOW (ref 4.22–5.81)
RDW: 14.2 % (ref 11.5–15.5)
WBC: 13.3 10*3/uL — ABNORMAL HIGH (ref 4.0–10.5)
nRBC: 0.2 % (ref 0.0–0.2)

## 2021-05-23 LAB — GLUCOSE, CAPILLARY
Glucose-Capillary: 101 mg/dL — ABNORMAL HIGH (ref 70–99)
Glucose-Capillary: 111 mg/dL — ABNORMAL HIGH (ref 70–99)
Glucose-Capillary: 112 mg/dL — ABNORMAL HIGH (ref 70–99)
Glucose-Capillary: 138 mg/dL — ABNORMAL HIGH (ref 70–99)
Glucose-Capillary: 141 mg/dL — ABNORMAL HIGH (ref 70–99)
Glucose-Capillary: 143 mg/dL — ABNORMAL HIGH (ref 70–99)
Glucose-Capillary: 168 mg/dL — ABNORMAL HIGH (ref 70–99)
Glucose-Capillary: 182 mg/dL — ABNORMAL HIGH (ref 70–99)
Glucose-Capillary: 192 mg/dL — ABNORMAL HIGH (ref 70–99)
Glucose-Capillary: 203 mg/dL — ABNORMAL HIGH (ref 70–99)
Glucose-Capillary: 204 mg/dL — ABNORMAL HIGH (ref 70–99)
Glucose-Capillary: 210 mg/dL — ABNORMAL HIGH (ref 70–99)
Glucose-Capillary: 216 mg/dL — ABNORMAL HIGH (ref 70–99)
Glucose-Capillary: 218 mg/dL — ABNORMAL HIGH (ref 70–99)
Glucose-Capillary: 223 mg/dL — ABNORMAL HIGH (ref 70–99)
Glucose-Capillary: 228 mg/dL — ABNORMAL HIGH (ref 70–99)
Glucose-Capillary: 229 mg/dL — ABNORMAL HIGH (ref 70–99)
Glucose-Capillary: 237 mg/dL — ABNORMAL HIGH (ref 70–99)
Glucose-Capillary: 238 mg/dL — ABNORMAL HIGH (ref 70–99)
Glucose-Capillary: 270 mg/dL — ABNORMAL HIGH (ref 70–99)
Glucose-Capillary: 66 mg/dL — ABNORMAL LOW (ref 70–99)
Glucose-Capillary: 71 mg/dL (ref 70–99)

## 2021-05-23 LAB — BASIC METABOLIC PANEL
Anion gap: 9 (ref 5–15)
Anion gap: 14 (ref 5–15)
BUN: 80 mg/dL — ABNORMAL HIGH (ref 8–23)
BUN: 96 mg/dL — ABNORMAL HIGH (ref 8–23)
CO2: 20 mmol/L — ABNORMAL LOW (ref 22–32)
CO2: 20 mmol/L — ABNORMAL LOW (ref 22–32)
Calcium: 7.8 mg/dL — ABNORMAL LOW (ref 8.9–10.3)
Calcium: 8.4 mg/dL — ABNORMAL LOW (ref 8.9–10.3)
Chloride: 109 mmol/L (ref 98–111)
Chloride: 109 mmol/L (ref 98–111)
Creatinine, Ser: 1.5 mg/dL — ABNORMAL HIGH (ref 0.61–1.24)
Creatinine, Ser: 1.83 mg/dL — ABNORMAL HIGH (ref 0.61–1.24)
GFR, Estimated: 48 mL/min — ABNORMAL LOW (ref >60)
GFR, Estimated: 38 mL/min — ABNORMAL LOW (ref >60)
Glucose, Bld: 257 mg/dL — ABNORMAL HIGH (ref 70–99)
Glucose, Bld: 259 mg/dL — ABNORMAL HIGH (ref 70–99)
Potassium: 4.8 mmol/L (ref 3.5–5.1)
Potassium: 5.5 mmol/L — ABNORMAL HIGH (ref 3.5–5.1)
Sodium: 138 mmol/L (ref 135–145)
Sodium: 143 mmol/L (ref 135–145)

## 2021-05-23 LAB — HEPARIN LEVEL (UNFRACTIONATED): Heparin Unfractionated: 0.29 IU/mL — ABNORMAL LOW (ref 0.30–0.70)

## 2021-05-23 LAB — COOXEMETRY PANEL
Carboxyhemoglobin: 0.7 % (ref 0.5–1.5)
Methemoglobin: 0.8 % (ref 0.0–1.5)
O2 Saturation: 83.1 %
Total hemoglobin: 14.3 g/dL (ref 12.0–16.0)

## 2021-05-23 MED ORDER — MIDAZOLAM HCL 2 MG/2ML IJ SOLN: 4.0000 mg | Freq: Once | INTRAMUSCULAR | Status: AC

## 2021-05-23 MED ORDER — MIDAZOLAM HCL 2 MG/2ML IJ SOLN
INTRAMUSCULAR | Status: AC
  Administered 2021-05-23: 4 mg via INTRAVENOUS
  Filled 2021-05-23: qty 4

## 2021-05-23 MED ORDER — SODIUM ZIRCONIUM CYCLOSILICATE 10 G PO PACK
10.0000 g | PACK | Freq: Two times a day (BID) | ORAL | Status: DC
  Administered 2021-05-23: 10 g
  Filled 2021-05-23 (×2): qty 1

## 2021-05-23 MED ORDER — REVEFENACIN 175 MCG/3ML IN SOLN
175.0000 ug | Freq: Every day | RESPIRATORY_TRACT | Status: DC
  Administered 2021-05-24 – 2021-06-01 (×8): 175 ug via RESPIRATORY_TRACT
  Filled 2021-05-23 (×11): qty 3

## 2021-05-23 MED ORDER — SODIUM CHLORIDE 0.9 % IV SOLN: INTRAVENOUS | Status: DC

## 2021-05-23 NOTE — H&P (View-Only) (Signed)
Patient ID: [name], male   DOB: 06/09/1946, 75 y.o.   MRN: [id number]     Advanced Heart Failure Rounding Note  PCP-Cardiologist: None   Subjective:    Patient remains in atrial flutter, rate around 140.  He is in on NE 3, amiodarone 60, heparin gtt.  CVP 8, getting Lasix 80 mg IV every 8 hrs.    Creatinine 1.61 => 1.5.   He has completed course of vancomycin/meropenem for possible PNA. Tm 100, WBCs 13.    Objective:   Weight Range: 129.7 kg Body mass index is 44.78 kg/m.   Vital Signs:   Temp:  [95.7 F (35.4 C)-100.2 F (37.9 C)] 99.9 F (37.7 C) (06/13 0600) Pulse Rate:  [94-141] 139 (06/13 0600) Resp:  [21-40] 26 (06/13 0600) BP: (67-129)/(43-91) 112/65 (06/13 0600) SpO2:  [96 %-98 %] 98 % (06/13 0600) Arterial Line BP: (92-159)/(36-106) 128/67 (06/13 0600) FiO2 (%):  [40 %] 40 % (06/13 0729) Weight:  [129.7 kg] 129.7 kg (06/13 0500) Last BM Date: 05/22/21  Weight change: Filed Weights   05/21/21 0500 05/22/21 0500 05/23/21 0500  Weight: 128.6 kg 128.9 kg 129.7 kg    Intake/Output:   Intake/Output Summary (Last 24 hours) at 05/23/2021 0805 Last data filed at 05/23/2021 0600 Gross per 24 hour  Intake 3665.25 ml  Output 3085 ml  Net 580.25 ml      Physical Exam    General:  Awake on vent.  HEENT: Normal Neck: Supple. JVP 8-9 cm. Carotids 2+ bilat; no bruits. No lymphadenopathy or thyromegaly appreciated. Cor: PMI nondisplaced. Tachy, regular rate & rhythm. No rubs, gallops or murmurs. Lungs: Crackles at bases.  Abdomen: Soft, nontender, nondistended. No hepatosplenomegaly. No bruits or masses. Good bowel sounds. Extremities: No cyanosis, clubbing, rash. 1+ ankle edema.  Neuro: Follows commands    Telemetry   Atrial flutter rate 140s (personally reviewed)   Labs    CBC Recent Labs    05/22/21 0425 05/23/21 0408  WBC 14.5* 13.3*  HGB 10.9* 10.3*  HCT 35.7* 33.7*  MCV 102.3* 103.4*  PLT 168 916   Basic Metabolic Panel Recent Labs     05/21/21 2241 05/22/21 0246 05/22/21 0425 05/23/21 0408  NA 141   < > 140 138  K 4.9   < > 5.0 4.8  CL 114*  --  113* 109  CO2 19*  --  18* 20*  GLUCOSE 198*  --  205* 257*  BUN 85*  --  84* 80*  CREATININE 1.63*  --  1.61* 1.50*  CALCIUM 7.9*  --  7.9* 7.8*  MG 2.9*  --   --   --    < > = values in this interval not displayed.   Liver Function Tests No results for input(s): AST, ALT, ALKPHOS, BILITOT, PROT, ALBUMIN in the last 72 hours. No results for input(s): LIPASE, AMYLASE in the last 72 hours. Cardiac Enzymes No results for input(s): CKTOTAL, CKMB, CKMBINDEX, TROPONINI in the last 72 hours.  BNP: BNP (last 3 results) Recent Labs    05/05/2021 2322 05/14/21 0500 05/16/21 0340  BNP 281.2* 216.4* 78.5    ProBNP (last 3 results) No results for input(s): PROBNP in the last 8760 hours.   D-Dimer No results for input(s): DDIMER in the last 72 hours. Hemoglobin A1C No results for input(s): HGBA1C in the last 72 hours. Fasting Lipid Panel No results for input(s): CHOL, HDL, LDLCALC, TRIG, CHOLHDL, LDLDIRECT in the last 72 hours. Thyroid Function Tests No results  for input(s): TSH, T4TOTAL, T3FREE, THYROIDAB in the last 72 hours.  Invalid input(s): FREET3  Other results:   Imaging    DG CHEST PORT 1 VIEW  Result Date: 05/22/2021 CLINICAL DATA:  PICC line placement. EXAM: PORTABLE CHEST 1 VIEW COMPARISON:  Earlier film, same date. FINDINGS: The endotracheal tube and NG tube is stable. New right-sided PICC line tip is in the mid SVC. Pacer wires/AICD are stable. Stable bilateral upper lobe pulmonary lesions. Stable left basilar process likely infiltrate and or atelectasis. IMPRESSION: New right-sided PICC line with tip in the mid SVC. Electronically Signed   By: Marijo Sanes M.D.   On: 05/22/2021 18:56   [place] EKG SITE RITE  Result Date: 05/22/2021 If Site Rite image not attached, placement could not be confirmed due to current cardiac rhythm.    Medications:      Scheduled Medications:  acetaminophen (TYLENOL) oral liquid 160 mg/5 mL  650 mg Per Tube Q6H   arformoterol  15 mcg Nebulization BID   aspirin  81 mg Per Tube Daily   atorvastatin  80 mg Per Tube Daily   bethanechol  10 mg Per Tube TID   budesonide (PULMICORT) nebulizer solution  0.5 mg Nebulization BID   chlorhexidine gluconate (MEDLINE KIT)  15 mL Mouth Rinse BID   Chlorhexidine Gluconate Cloth  6 each Topical Q0600   clonazePAM  1 mg Per Tube BID   docusate  100 mg Per Tube BID   feeding supplement (PROSource TF)  90 mL Per Tube TID   furosemide  80 mg Intravenous Q8H   Gerhardt's butt cream   Topical QID   levothyroxine  50 mcg Per Tube Q0600   mouth rinse  15 mL Mouth Rinse 10 times per day   pantoprazole sodium  40 mg Per Tube Daily   polyethylene glycol  17 g Per Tube Daily   senna  1 tablet Per Tube Daily   sodium chloride flush  10-40 mL Intracatheter Q12H   sodium chloride flush  10-40 mL Intracatheter Q12H   sodium chloride flush  10-40 mL Intracatheter Q12H   sodium chloride flush  3 mL Intravenous Q12H    Infusions:  sodium chloride 20 mL/hr at 05/21/21 1840   sodium chloride     sodium chloride     sodium chloride Stopped (05/22/21 1940)   amiodarone 60 mg/hr (05/23/21 0624)   dexmedetomidine (PRECEDEX) IV infusion 0.1 mcg/kg/hr (05/22/21 0628)   feeding supplement (VITAL 1.5 CAL) Stopped (05/23/21 0016)   fentaNYL infusion INTRAVENOUS 25 mcg/hr (05/23/21 0600)   heparin 1,500 Units/hr (05/23/21 0600)   insulin Stopped (05/23/21 0535)   norepinephrine (LEVOPHED) Adult infusion 4 mcg/min (05/23/21 0600)    PRN Medications: sodium chloride, Place/Maintain arterial line **AND** sodium chloride, acetaminophen (TYLENOL) oral liquid 160 mg/5 mL, dextrose, docusate, fentaNYL, fentaNYL (SUBLIMAZE) injection, fentaNYL (SUBLIMAZE) injection, metoprolol tartrate, sodium chloride flush, sodium chloride flush, sodium chloride flush, sodium chloride  flush  Assessment/Plan    Shock: suspect combination of septic shock and cardiogenic shock in setting of recurrent AF.  Echo 05/12/21 EF  20-25% (previous echo EF 35-40%). Bcx NGTD.  Resp cx - rare GPC.  Atrial flutter/RVR playing a role. Improved, NE down to 3. Co-ox 83%, suspect significant vasodilatory shock component.  - Continue to wean NE as able.   - Need to get him back in NSR (see below).  2. 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 Echo 05/12/21 EF 20-25% (previous  Echo EF 35-40%).  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 Last cath 1/19 (LAD is patent, other vessels occluded with collaterals from the LAD; SVG to OM and SVG to PDA chronically occluded).  Today, CVP 8 with co-ox 83%. Creatinine stable at 1.5.  - Can consider repeat cath as needed but doubt ischemia is driving factor - Continue Lasix 80 mg IV tid today.  - Wean off NE as able today.  - Needs to get back into NSR.  3. Recurrent AF/AFL with RVR: H/o DCCV in 12/21. Tachycardic despite amiodarone gtt 60.  - Continue amiodarone gtt.  - Continue heparin gtt. - TEE-DCCV today (discussed with wife).  - If unable to maintain NSR can consider AVN ablation as he already has CRT 4. Acute hypercapenic/hypoxic respiratory failure: H/o COPD. Remains on vent.  - CCM managing 5. CKD IV: Baseline creatinine 1.3-1.7.  Stable at 1.5 today.  6. CAD: h/o CABG with occluded grafts.  Last cath 1/19 (LAD is patent, other vessels occluded with collaterals from the LAD; SVG to OM and SVG to PDA chronically occluded) - continue ASA/statin - Can consider repeat cath as needed but doubt ischemia is driving factor 7. Thrombocytopenia: Resolved. - HIT negative 8. DM2 - per CCM  9. Urinary retention/hematuria: Foley had to be placed by Urology.  Suspect local Foley trauma.  - follow 10. COPD: Moderate to severe.  11. 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  12.  Lung cancer: Had radiation.  13.  PAD: Occluded left SFA and significant right SFA stenosis on last CTA.  ABIs in 3/20 with significant disease bilaterally but stable.   CRITICAL CARE Performed by: [name]  Total critical care time: 40 minutes  Critical care time was exclusive of separately billable procedures and treating other patients.  Critical care was necessary to treat or prevent imminent or life-threatening deterioration.  Critical care was time spent personally by me on the following activities: development of treatment plan with patient and/or surrogate as well as nursing, discussions with consultants, evaluation of patient's response to treatment, examination of patient, obtaining history from patient or surrogate, ordering and performing treatments and interventions, ordering and review of laboratory studies, ordering and review of radiographic studies, pulse oximetry and re-evaluation of patient's condition.   Length of Stay: [place], MD  05/23/2021, 8:05 AM  Advanced Heart Failure Team Pager [phone] (M-F; 7a - 5p)  Please contact [facility] for night-coverage after hours (5p -7a ) and weekends on [email or web address]

## 2021-05-23 NOTE — Progress Notes (Signed)
Patient ID: [name], male   DOB: Mar 11, 1946, 75 y.o.   MRN: [id number]     Advanced Heart Failure Rounding Note  PCP-Cardiologist: None   Subjective:    Patient remains in atrial flutter, rate around 140.  He is in on NE 3, amiodarone 60, heparin gtt.  CVP 8, getting Lasix 80 mg IV every 8 hrs.    Creatinine 1.61 => 1.5.   He has completed course of vancomycin/meropenem for possible PNA. Tm 100, WBCs 13.    Objective:   Weight Range: 129.7 kg Body mass index is 44.78 kg/m.   Vital Signs:   Temp:  [95.7 F (35.4 C)-100.2 F (37.9 C)] 99.9 F (37.7 C) (06/13 0600) Pulse Rate:  [94-141] 139 (06/13 0600) Resp:  [21-40] 26 (06/13 0600) BP: (67-129)/(43-91) 112/65 (06/13 0600) SpO2:  [96 %-98 %] 98 % (06/13 0600) Arterial Line BP: (92-159)/(36-106) 128/67 (06/13 0600) FiO2 (%):  [40 %] 40 % (06/13 0729) Weight:  [129.7 kg] 129.7 kg (06/13 0500) Last BM Date: 05/22/21  Weight change: Filed Weights   05/21/21 0500 05/22/21 0500 05/23/21 0500  Weight: 128.6 kg 128.9 kg 129.7 kg    Intake/Output:   Intake/Output Summary (Last 24 hours) at 05/23/2021 0805 Last data filed at 05/23/2021 0600 Gross per 24 hour  Intake 3665.25 ml  Output 3085 ml  Net 580.25 ml      Physical Exam    General:  Awake on vent.  HEENT: Normal Neck: Supple. JVP 8-9 cm. Carotids 2+ bilat; no bruits. No lymphadenopathy or thyromegaly appreciated. Cor: PMI nondisplaced. Tachy, regular rate & rhythm. No rubs, gallops or murmurs. Lungs: Crackles at bases.  Abdomen: Soft, nontender, nondistended. No hepatosplenomegaly. No bruits or masses. Good bowel sounds. Extremities: No cyanosis, clubbing, rash. 1+ ankle edema.  Neuro: Follows commands    Telemetry   Atrial flutter rate 140s (personally reviewed)   Labs    CBC Recent Labs    05/22/21 0425 05/23/21 0408  WBC 14.5* 13.3*  HGB 10.9* 10.3*  HCT 35.7* 33.7*  MCV 102.3* 103.4*  PLT 168 841   Basic Metabolic Panel Recent Labs     05/21/21 2241 05/22/21 0246 05/22/21 0425 05/23/21 0408  NA 141   < > 140 138  K 4.9   < > 5.0 4.8  CL 114*  --  113* 109  CO2 19*  --  18* 20*  GLUCOSE 198*  --  205* 257*  BUN 85*  --  84* 80*  CREATININE 1.63*  --  1.61* 1.50*  CALCIUM 7.9*  --  7.9* 7.8*  MG 2.9*  --   --   --    < > = values in this interval not displayed.   Liver Function Tests No results for input(s): AST, ALT, ALKPHOS, BILITOT, PROT, ALBUMIN in the last 72 hours. No results for input(s): LIPASE, AMYLASE in the last 72 hours. Cardiac Enzymes No results for input(s): CKTOTAL, CKMB, CKMBINDEX, TROPONINI in the last 72 hours.  BNP: BNP (last 3 results) Recent Labs    05/02/2021 2322 05/14/21 0500 05/16/21 0340  BNP 281.2* 216.4* 78.5    ProBNP (last 3 results) No results for input(s): PROBNP in the last 8760 hours.   D-Dimer No results for input(s): DDIMER in the last 72 hours. Hemoglobin A1C No results for input(s): HGBA1C in the last 72 hours. Fasting Lipid Panel No results for input(s): CHOL, HDL, LDLCALC, TRIG, CHOLHDL, LDLDIRECT in the last 72 hours. Thyroid Function Tests No results  for input(s): TSH, T4TOTAL, T3FREE, THYROIDAB in the last 72 hours.  Invalid input(s): FREET3  Other results:   Imaging    DG CHEST PORT 1 VIEW  Result Date: 05/22/2021 CLINICAL DATA:  PICC line placement. EXAM: PORTABLE CHEST 1 VIEW COMPARISON:  Earlier film, same date. FINDINGS: The endotracheal tube and NG tube is stable. New right-sided PICC line tip is in the mid SVC. Pacer wires/AICD are stable. Stable bilateral upper lobe pulmonary lesions. Stable left basilar process likely infiltrate and or atelectasis. IMPRESSION: New right-sided PICC line with tip in the mid SVC. Electronically Signed   By: Marijo Sanes M.D.   On: 05/22/2021 18:56   [place] EKG SITE RITE  Result Date: 05/22/2021 If Site Rite image not attached, placement could not be confirmed due to current cardiac rhythm.    Medications:      Scheduled Medications:  acetaminophen (TYLENOL) oral liquid 160 mg/5 mL  650 mg Per Tube Q6H   arformoterol  15 mcg Nebulization BID   aspirin  81 mg Per Tube Daily   atorvastatin  80 mg Per Tube Daily   bethanechol  10 mg Per Tube TID   budesonide (PULMICORT) nebulizer solution  0.5 mg Nebulization BID   chlorhexidine gluconate (MEDLINE KIT)  15 mL Mouth Rinse BID   Chlorhexidine Gluconate Cloth  6 each Topical Q0600   clonazePAM  1 mg Per Tube BID   docusate  100 mg Per Tube BID   feeding supplement (PROSource TF)  90 mL Per Tube TID   furosemide  80 mg Intravenous Q8H   Gerhardt's butt cream   Topical QID   levothyroxine  50 mcg Per Tube Q0600   mouth rinse  15 mL Mouth Rinse 10 times per day   pantoprazole sodium  40 mg Per Tube Daily   polyethylene glycol  17 g Per Tube Daily   senna  1 tablet Per Tube Daily   sodium chloride flush  10-40 mL Intracatheter Q12H   sodium chloride flush  10-40 mL Intracatheter Q12H   sodium chloride flush  10-40 mL Intracatheter Q12H   sodium chloride flush  3 mL Intravenous Q12H    Infusions:  sodium chloride 20 mL/hr at 05/21/21 1840   sodium chloride     sodium chloride     sodium chloride Stopped (05/22/21 1940)   amiodarone 60 mg/hr (05/23/21 0624)   dexmedetomidine (PRECEDEX) IV infusion 0.1 mcg/kg/hr (05/22/21 0628)   feeding supplement (VITAL 1.5 CAL) Stopped (05/23/21 0016)   fentaNYL infusion INTRAVENOUS 25 mcg/hr (05/23/21 0600)   heparin 1,500 Units/hr (05/23/21 0600)   insulin Stopped (05/23/21 0535)   norepinephrine (LEVOPHED) Adult infusion 4 mcg/min (05/23/21 0600)    PRN Medications: sodium chloride, Place/Maintain arterial line **AND** sodium chloride, acetaminophen (TYLENOL) oral liquid 160 mg/5 mL, dextrose, docusate, fentaNYL, fentaNYL (SUBLIMAZE) injection, fentaNYL (SUBLIMAZE) injection, metoprolol tartrate, sodium chloride flush, sodium chloride flush, sodium chloride flush, sodium chloride  flush  Assessment/Plan    Shock: suspect combination of septic shock and cardiogenic shock in setting of recurrent AF.  Echo 05/12/21 EF  20-25% (previous echo EF 35-40%). Bcx NGTD.  Resp cx - rare GPC.  Atrial flutter/RVR playing a role. Improved, NE down to 3. Co-ox 83%, suspect significant vasodilatory shock component.  - Continue to wean NE as able.   - Need to get him back in NSR (see below).  2. 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 Echo 05/12/21 EF 20-25% (previous  Echo EF 35-40%).  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 Last cath 1/19 (LAD is patent, other vessels occluded with collaterals from the LAD; SVG to OM and SVG to PDA chronically occluded).  Today, CVP 8 with co-ox 83%. Creatinine stable at 1.5.  - Can consider repeat cath as needed but doubt ischemia is driving factor - Continue Lasix 80 mg IV tid today.  - Wean off NE as able today.  - Needs to get back into NSR.  3. Recurrent AF/AFL with RVR: H/o DCCV in 12/21. Tachycardic despite amiodarone gtt 60.  - Continue amiodarone gtt.  - Continue heparin gtt. - TEE-DCCV today (discussed with wife).  - If unable to maintain NSR can consider AVN ablation as he already has CRT 4. Acute hypercapenic/hypoxic respiratory failure: H/o COPD. Remains on vent.  - CCM managing 5. CKD IV: Baseline creatinine 1.3-1.7.  Stable at 1.5 today.  6. CAD: h/o CABG with occluded grafts.  Last cath 1/19 (LAD is patent, other vessels occluded with collaterals from the LAD; SVG to OM and SVG to PDA chronically occluded) - continue ASA/statin - Can consider repeat cath as needed but doubt ischemia is driving factor 7. Thrombocytopenia: Resolved. - HIT negative 8. DM2 - per CCM  9. Urinary retention/hematuria: Foley had to be placed by Urology.  Suspect local Foley trauma.  - follow 10. COPD: Moderate to severe.  11. 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  12.  Lung cancer: Had radiation.  13.  PAD: Occluded left SFA and significant right SFA stenosis on last CTA.  ABIs in 3/20 with significant disease bilaterally but stable.   CRITICAL CARE Performed by: [name]  Total critical care time: 40 minutes  Critical care time was exclusive of separately billable procedures and treating other patients.  Critical care was necessary to treat or prevent imminent or life-threatening deterioration.  Critical care was time spent personally by me on the following activities: development of treatment plan with patient and/or surrogate as well as nursing, discussions with consultants, evaluation of patient's response to treatment, examination of patient, obtaining history from patient or surrogate, ordering and performing treatments and interventions, ordering and review of laboratory studies, ordering and review of radiographic studies, pulse oximetry and re-evaluation of patient's condition.   Length of Stay: [place], MD  05/23/2021, 8:05 AM  Advanced Heart Failure Team Pager [phone] (M-F; 7a - 5p)  Please contact [facility] for night-coverage after hours (5p -7a ) and weekends on [email or web address]

## 2021-05-23 NOTE — Progress Notes (Addendum)
[place] for  heparin  Indication: afib  Labs: Recent Labs    05/21/21 0500 05/21/21 2241 05/22/21 0246 05/22/21 0425 05/22/21 1337 05/22/21 2309 05/23/21 0408  HGB 11.5*  --  11.6* 10.9*  --   --  10.3*  HCT 38.0*  --  34.0* 35.7*  --   --  33.7*  PLT 163  --   --  168  --   --  161  HEPARINUNFRC 0.44  --   --  0.13* 0.28* 0.31 0.29*  CREATININE 1.79* 1.63*  --  1.61*  --   --  1.50*    Assessment: 75 yo male with h/o Afib, Xarelto on hold, new thrombocytopenia on heparin and concern for HIT so was switched to argatroban. Heparin antibody was negative, so switched back to heparin on 05/14/21.  Heparin level came back slightly subtherapeutic at 0.29, on 1500 units/hr. Hgb 10.3, plt 161. No s/sx of bleeding or infusion issues. Hematuria last night but resolved this morning.    Goal of Therapy:  Heparin level 0.3-0.7 units/ml Monitor platelets by anticoagulation protocol: Yes   Plan:  Increase IV heparin to 1600 units/hr. Daily heparin level and CBC. F/u after DCCV  Thanks for allowing pharmacy to be a part of this patient's care.  Antonietta Jewel, PharmD, [place] Clinical Pharmacist  Phone: [phone] 05/23/2021 8:06 AM  Please check AMION for all [place] phone numbers After 10:00 PM, call [place] [phone]

## 2021-05-23 NOTE — Interval H&P Note (Signed)
History and Physical Interval Note:  05/23/2021 9:39 AM  [name]  has presented today for surgery, with the diagnosis of * No surgery found *.  The various methods of treatment have been discussed with the patient and family. After consideration of risks, benefits and other options for treatment, the patient has consented to  * No surgery found * as a surgical intervention.  The patient's history has been reviewed, patient examined, no change in status, stable for surgery.  I have reviewed the patient's chart and labs.  Questions were answered to the patient's satisfaction.     Signa Cheek [employer]

## 2021-05-23 NOTE — Treatment Plan (Signed)
  Conscious sedation note:   Patient underwent TEE, electrical cardioversion by heart failure team.  PCCM managed conscious sedation, patient remained on mechanical ventilator Initially he was given 2 mg of IV Versed and 100 mg of IV fentanyl, post TEE patient was awake, and 2 mg of IV Versed and 100 mg of fentanyl with appropriate sedation, he underwent electrical cardioversion x1 with conversion of rhythm, A. fib/flutter to paced rhythm.  Patient tolerated procedure well Please see procedure note by cardiology/heart failure team.    Jacky Kindle MD [facility] See Amion for pager If no response to pager, please call [phone] until 7pm After 7pm, Please call E-link [phone]

## 2021-05-23 NOTE — CV Procedure (Signed)
Procedure: TEE  Indication: Atrial flutter  Sedation: Per CCM  Findings: Please see echo section for full report.  The left ventricle was mildly dilated with normal wall thickness.  EF 20-25%, diffuse hypokinesis.  The right ventricle was normal in size with mildly decreased systolic function.  Pacemaker leads in right heart.  Mild right atrial enlargement.  Moderate left atrial enlargement, no LA appendage thrombus.  No ASD/PFO by color doppler.  Mild-moderate TR, peak RV-RA gradient 28 mmHg.  Mild-moderate mitral regurgitation.  Trileaflet aortic valve with trivial aortic insufficiency.  Moderate calcification of the aortic valve without significant stenosis.  Normal caliber thoracic aorta with grade III plaque descending thoracic aorta.   May proceed with DCCV.   [name] 05/23/2021 9:56 AM

## 2021-05-23 NOTE — Progress Notes (Signed)
NAME:  [name], MRN:  [id number], DOB:  06/25/1946, LOS: 30 ADMISSION DATE:  04/29/2021, CONSULTATION DATE:  05/09/2021 REFERRING MD:  Dr. Randal Buba, CHIEF COMPLAINT:  SOB  History of Present Illness:  [name] is a 75 yo M with past medical hx as below presenting from home with complaints of SOB/ respiratory distress on 5/29.  Treated by EMS with duonebs, epi, solumedrol, and mag without improvement, requiring BVM.  On arrival to ER, patient required intubation as he was peri- arrest, unresponsive, bradycardic, temp 95.9, hypotensive, and apneic with diffuse apical wheeze requiring emergent intubation on arrival.  Noted to have pink frothy secretions on intubation.  PCCM called for admit.   Pertinent  Medical History  Morbid obesity, HFrEF (12/24/20 EF 40-45%, G2DD, PAF, mildly reduced systolic RV, mild MR), VT on amio/ ICD, CAD s/p CABG 1996, LBBB, HLD, COPD, OSA on CPAP, AAA (s/p repair 2015), NSCL lung CA s/p XRT 2018, anemia, BPH, DMT2, GERD, hiatal hernia, PVD, tremor  Significant Hospital Events: Including procedures, antibiotic start and stop dates in addition to other pertinent events   5/30 admitted for respiratory failure/ intubated, empiric CAP coverage/ duresis 6/1 BLLE Duplex> no DVT, unable to complete thoracentesis due to anatomy  6/2 Tmax 101, -2.9L in last 24 hours, on levo at 29mcg, precedex. Resp culture negative. BC pending. 6/3 switched from heparin to argatroban for thrombocytopenia, HIT ab pending 6/4 HIT AB negative, switched back to heparin, failed SBT due to tachypnea 6/7 afib w/ RVR, HR increased abruptly to 140s from 90s, received amio bolus and started on drip, got fentanyl and versed, started cefepime and vanco, d/c norepi, receiving vasopressin, precedex, phenylephrine 6/9 - Stopped cefepime and started meropenem, continued vanco due to concern for cefepime- associated drug rash 6/12 unable to extubate. Afib RVR worse 6/13: on mech vent; Levo at 2; 60 Amio;  Afib RVR persist; TEE and cardioverted 150 J into sinus rhythm and HR in 80s  Interim History / Subjective:   Patient remains intubated on PRVC Afib RVR persist (HR in 140s) Levo 2, 60 Amio, 50 Fentanyl, Heparin and Insulin drip running   Objective   Blood pressure 112/65, pulse (!) 139, temperature 99.9 F (37.7 C), resp. rate (!) 26, height 5\' 7"  (1.702 m), weight 129.7 kg, SpO2 98 %. CVP:  [0 mmHg-37 mmHg] 12 mmHg  Vent Mode: PRVC FiO2 (%):  [40 %] 40 % Set Rate:  [24 bmp] 24 bmp Vt Set:  [530 mL] 530 mL PEEP:  [5 cmH20] 5 cmH20 Pressure Support:  [8 cmH20] 8 cmH20 Plateau Pressure:  [15 cmH20-27 cmH20] 15 cmH20   Intake/Output Summary (Last 24 hours) at 05/23/2021 0749 Last data filed at 05/23/2021 0600 Gross per 24 hour  Intake 3665.25 ml  Output 3140 ml  Net 525.25 ml     Filed Weights   05/21/21 0500 05/22/21 0500 05/23/21 0500  Weight: 128.6 kg 128.9 kg 129.7 kg    Examination: General: critically ill appearing; on mech vent  HEENT: MM pink/moist; ET tube in place Neuro: sedate; cough/gag reflex CV: s1s2, irregular rhythm with rate in 140s PULM: dim clear bs bilaterally; on mech vent: PRVC GI: soft, bsx4 active  Extremities: warm/dry, dependent upper and lower edema  Skin: no rashes or lesions   Labs/imaging that I havepersonally reviewed  (right click and "Reselect all SmartList Selections" daily)   K 4.8 Glucose 257 BUN 80 from 84 Creatinine 1.50 from 1.61 WBC 13.3 Hgb 10.3    Resolved Hospital  Problem list    Thrombocytopenia 4T score= 4, intermediate risk for HIT, although B- lactams can also decrease platelets. HIT Ab negative Hypernatremia  Assessment & Plan:   Acute hypoxic/ hypercarbic respiratory failure  Hx of COPD Patient had multifocal infiltrates, procalcitonin was elevated Concern for pneumonia however that was treated developed higher fever concern for potential secondary infection with line Or Foley which were both removed and  exchanged Plan: -Repeat SBT after cardioversion today; consider extubation -Continue mech vent support 8cc/kg -continue pulmicort/brovana/yupelri -VAP prevention in place -meropenem off since 6/11; WBC trending down; afebrile -Trend CBC  HFrEF (12/24/20 EF 40-45%, G2DD, mildly reduced systolic RV, mild MR) >> LVEF down to 20-25% this admission - Hx of VT on amio w/ICD, CAD s/p CABG 1996, LBBB, HTN, PAF Atrial fibrillation with RVR Plan: -Cards and HF following: TEE and cardioverted 6/13 with 150 J; patient rhythm in 80s and regular; 4 versed and 200 fentanyl given for procedure. -continue heparin, amio -wean levo for map >65 -Picc line in place for coox -Continue diuresis  CKD 3b, hx BPH, phimosis on PTA, finasteride  Phimosis required bedside dilation by urology for foley placement 5/30; Remove foley 6/8 due to concern for infection; replaced 6/9 by urology Plan: -Trend BMP and UOP -Keep foley  Hyperglycemia, uncontrolled Hx T2DM, A1C 5.9 05/09/21 Plan: -continue insulin drip -CBG monitoring for goal of 140-180 -continue tube feeds  Constipation Plan: -Continue bowel regimen   Chronic Conditions:   Hypothyroidism, stable Plan: -Continue synthroid  Class 3 obesity, stable Plan: -Education when able   Baseline anxiety disorder Plan: -Klonopin BID    Best practice (right click and "Reselect all SmartList Selections" daily)  Diet:  TF Pain/Anxiety/Delirium protocol (if indicated): Yes (RASS goal 0) VAP protocol (if indicated): Yes DVT prophylaxis: Systemic AC GI prophylaxis: PPI Glucose control:  Insulin Drip Central venous access:  Yes, and it is still needed Arterial line:  Yes Foley:  Yes Mobility:  bed rest  PT consulted: [name] Last date of multidisciplinary goals of care discussion: (6/13: updated wife and daughter at bedside; patient sedated on ventilator) Code Status:  full code Disposition: ICU  This patient is critically ill with multiple organ  system failure; which, requires frequent high complexity decision making, assessment, support, evaluation, and titration of therapies. This was completed through the application of advanced monitoring technologies and extensive interpretation of multiple databases. During this encounter critical care time was devoted to patient care services described in this note for 35 minutes.  JD Rexene Agent [facility] 05/23/2021, 8:02 AM  Please see [email or web address] for pager details.  From 7A-7P if no response, please call [phone]. After hours, please call ELink [phone].

## 2021-05-23 NOTE — Progress Notes (Signed)
EKG CRITICAL VALUE     12 lead EKG performed.  Critical value noted.  Rebeca Alert, RN notified.   Warren Lacy, CCT 05/23/2021 9:23 AM

## 2021-05-23 NOTE — Progress Notes (Addendum)
RN noted differences in CBG results. Aline blood sample resulted CBG of 71, PICC Line SafeSet blood sample resulted CBG of 168. For the next rate change, RN will change to using Aline line for blood samples and will use these results for CBG and Endotool for insulin titrations.

## 2021-05-23 NOTE — Progress Notes (Signed)
  Echocardiogram 2D Echocardiogram has been performed.  [name] F 05/23/2021, 10:09 AM

## 2021-05-23 NOTE — Procedures (Signed)
Electrical Cardioversion Procedure Note MUHAMMAD VACCA [id number] 07-16-1946  Procedure: Electrical Cardioversion Indications:  Atrial Flutter  Procedure Details Consent: Risks of procedure as well as the alternatives and risks of each were explained to the (patient/caregiver).  Consent for procedure obtained. Time Out: Verified patient identification, verified procedure, site/side was marked, verified correct patient position, special equipment/implants available, medications/allergies/relevent history reviewed, required imaging and test results available.  Performed  Patient placed on cardiac monitor, pulse oximetry, supplemental oxygen as necessary.  Sedation given:  Per CCM Pacer pads placed anterior and posterior chest.  Cardioverted 1 time(s).  Cardioverted at 150J.  Evaluation Findings: Post procedure EKG shows: NSR Complications: None Patient did tolerate procedure well.   [name] 05/23/2021, 9:56 AM

## 2021-05-23 NOTE — Progress Notes (Signed)
Per Jamse Arn, MD, continue to draw CBG from Aline, and treat hypoglycemia per protocol.

## 2021-05-23 NOTE — Progress Notes (Signed)
RN called Elink in regards to patients recent Aline CBG of 66. Elink RN advised waiting to treat patients hypoglycemia until orders form Elink MD. RN is awaiting orders at this time.

## 2021-05-24 ENCOUNTER — Inpatient Hospital Stay (HOSPITAL_COMMUNITY): Payer: Medicare Other

## 2021-05-24 ENCOUNTER — Encounter (HOSPITAL_COMMUNITY): Payer: Medicare Other | Admitting: Cardiology

## 2021-05-24 LAB — BASIC METABOLIC PANEL
Anion gap: 12 (ref 5–15)
Anion gap: 11 (ref 5–15)
Anion gap: 13 (ref 5–15)
BUN: 103 mg/dL — ABNORMAL HIGH (ref 8–23)
BUN: 117 mg/dL — ABNORMAL HIGH (ref 8–23)
BUN: 130 mg/dL — ABNORMAL HIGH (ref 8–23)
CO2: 19 mmol/L — ABNORMAL LOW (ref 22–32)
CO2: 18 mmol/L — ABNORMAL LOW (ref 22–32)
CO2: 17 mmol/L — ABNORMAL LOW (ref 22–32)
Calcium: 7.8 mg/dL — ABNORMAL LOW (ref 8.9–10.3)
Calcium: 8.3 mg/dL — ABNORMAL LOW (ref 8.9–10.3)
Calcium: 8.3 mg/dL — ABNORMAL LOW (ref 8.9–10.3)
Chloride: 104 mmol/L (ref 98–111)
Chloride: 110 mmol/L (ref 98–111)
Chloride: 110 mmol/L (ref 98–111)
Creatinine, Ser: 1.85 mg/dL — ABNORMAL HIGH (ref 0.61–1.24)
Creatinine, Ser: 2.08 mg/dL — ABNORMAL HIGH (ref 0.61–1.24)
Creatinine, Ser: 2.48 mg/dL — ABNORMAL HIGH (ref 0.61–1.24)
GFR, Estimated: 37 mL/min — ABNORMAL LOW (ref >60)
GFR, Estimated: 32 mL/min — ABNORMAL LOW (ref >60)
GFR, Estimated: 26 mL/min — ABNORMAL LOW (ref >60)
Glucose, Bld: 398 mg/dL — ABNORMAL HIGH (ref 70–99)
Glucose, Bld: 198 mg/dL — ABNORMAL HIGH (ref 70–99)
Glucose, Bld: 177 mg/dL — ABNORMAL HIGH (ref 70–99)
Potassium: 4.1 mmol/L (ref 3.5–5.1)
Potassium: 5.1 mmol/L (ref 3.5–5.1)
Potassium: 5.3 mmol/L — ABNORMAL HIGH (ref 3.5–5.1)
Sodium: 135 mmol/L (ref 135–145)
Sodium: 139 mmol/L (ref 135–145)
Sodium: 140 mmol/L (ref 135–145)

## 2021-05-24 LAB — COOXEMETRY PANEL
Carboxyhemoglobin: 0.7 % (ref 0.5–1.5)
Methemoglobin: 1.1 % (ref 0.0–1.5)
O2 Saturation: 80.5 %
Total hemoglobin: 11.3 g/dL — ABNORMAL LOW (ref 12.0–16.0)

## 2021-05-24 LAB — URINALYSIS, ROUTINE W REFLEX MICROSCOPIC
Bacteria, UA: NONE SEEN
Bilirubin Urine: NEGATIVE
Glucose, UA: NEGATIVE mg/dL
Ketones, ur: NEGATIVE mg/dL
Nitrite: NEGATIVE
Protein, ur: NEGATIVE mg/dL
RBC / HPF: >50 RBC/hpf — ABNORMAL HIGH (ref 0–5)
Specific Gravity, Urine: 1.016 (ref 1.005–1.030)
WBC, UA: >50 WBC/hpf — ABNORMAL HIGH (ref 0–5)
pH: 5 (ref 5.0–8.0)

## 2021-05-24 LAB — CORTISOL: Cortisol, Plasma: 19 ug/dL

## 2021-05-24 LAB — CULTURE, BLOOD (ROUTINE X 2)
Culture: NO GROWTH
Culture: NO GROWTH
Special Requests: ADEQUATE

## 2021-05-24 LAB — GLUCOSE, CAPILLARY
Glucose-Capillary: 227 mg/dL — ABNORMAL HIGH (ref 70–99)
Glucose-Capillary: 228 mg/dL — ABNORMAL HIGH (ref 70–99)
Glucose-Capillary: 231 mg/dL — ABNORMAL HIGH (ref 70–99)
Glucose-Capillary: 221 mg/dL — ABNORMAL HIGH (ref 70–99)
Glucose-Capillary: 202 mg/dL — ABNORMAL HIGH (ref 70–99)
Glucose-Capillary: 174 mg/dL — ABNORMAL HIGH (ref 70–99)
Glucose-Capillary: 190 mg/dL — ABNORMAL HIGH (ref 70–99)
Glucose-Capillary: 183 mg/dL — ABNORMAL HIGH (ref 70–99)
Glucose-Capillary: 171 mg/dL — ABNORMAL HIGH (ref 70–99)
Glucose-Capillary: 154 mg/dL — ABNORMAL HIGH (ref 70–99)
Glucose-Capillary: 145 mg/dL — ABNORMAL HIGH (ref 70–99)
Glucose-Capillary: 188 mg/dL — ABNORMAL HIGH (ref 70–99)
Glucose-Capillary: 191 mg/dL — ABNORMAL HIGH (ref 70–99)
Glucose-Capillary: 177 mg/dL — ABNORMAL HIGH (ref 70–99)
Glucose-Capillary: 172 mg/dL — ABNORMAL HIGH (ref 70–99)
Glucose-Capillary: 187 mg/dL — ABNORMAL HIGH (ref 70–99)
Glucose-Capillary: 173 mg/dL — ABNORMAL HIGH (ref 70–99)
Glucose-Capillary: 162 mg/dL — ABNORMAL HIGH (ref 70–99)

## 2021-05-24 LAB — PROCALCITONIN: Procalcitonin: 0.45 ng/mL

## 2021-05-24 LAB — CBC
HCT: 32.8 % — ABNORMAL LOW (ref 39.0–52.0)
Hemoglobin: 9.7 g/dL — ABNORMAL LOW (ref 13.0–17.0)
MCH: 30.9 pg (ref 26.0–34.0)
MCHC: 29.6 g/dL — ABNORMAL LOW (ref 30.0–36.0)
MCV: 104.5 fL — ABNORMAL HIGH (ref 80.0–100.0)
Platelets: 193 10*3/uL (ref 150–400)
RBC: 3.14 MIL/uL — ABNORMAL LOW (ref 4.22–5.81)
RDW: 14.6 % (ref 11.5–15.5)
WBC: 14.7 10*3/uL — ABNORMAL HIGH (ref 4.0–10.5)
nRBC: 0 % (ref 0.0–0.2)

## 2021-05-24 LAB — MRSA NEXT GEN BY PCR, NASAL: MRSA by PCR Next Gen: NOT DETECTED

## 2021-05-24 LAB — HEPARIN LEVEL (UNFRACTIONATED): Heparin Unfractionated: 0.34 IU/mL (ref 0.30–0.70)

## 2021-05-24 MED ORDER — POTASSIUM CHLORIDE 20 MEQ PO PACK
20.0000 meq | PACK | Freq: Once | ORAL | Status: AC
  Administered 2021-05-24: 20 meq
  Filled 2021-05-24: qty 1

## 2021-05-24 MED ORDER — VANCOMYCIN HCL IN DEXTROSE 1-5 GM/200ML-% IV SOLN: 1000.0000 mg | INTRAVENOUS | Status: DC

## 2021-05-24 MED ORDER — FUROSEMIDE 10 MG/ML IJ SOLN
12.0000 mg/h | INTRAVENOUS | Status: DC
  Administered 2021-05-24 – 2021-05-25 (×3): 12 mg/h via INTRAVENOUS
  Filled 2021-05-24 (×3): qty 20

## 2021-05-24 MED ORDER — POTASSIUM CHLORIDE CRYS ER 20 MEQ PO TBCR: 20.0000 meq | EXTENDED_RELEASE_TABLET | Freq: Once | ORAL | Status: DC

## 2021-05-24 MED ORDER — SODIUM CHLORIDE 0.9 % IV SOLN
1.0000 g | Freq: Two times a day (BID) | INTRAVENOUS | Status: DC
  Administered 2021-05-24 – 2021-05-25 (×3): 1 g via INTRAVENOUS
  Filled 2021-05-24 (×4): qty 1

## 2021-05-24 MED ORDER — NOREPINEPHRINE 16 MG/250ML-% IV SOLN
0.0000 ug/min | INTRAVENOUS | Status: DC
  Administered 2021-05-24: 5 ug/min via INTRAVENOUS
  Administered 2021-05-24: 12 ug/min via INTRAVENOUS
  Administered 2021-05-25: 13 ug/min via INTRAVENOUS
  Administered 2021-05-26: 8 ug/min via INTRAVENOUS
  Administered 2021-05-27 – 2021-05-29 (×3): 15 ug/min via INTRAVENOUS
  Administered 2021-05-30: 12 ug/min via INTRAVENOUS
  Filled 2021-05-24 (×8): qty 250

## 2021-05-24 MED ORDER — VANCOMYCIN HCL 2000 MG/400ML IV SOLN
2000.0000 mg | Freq: Once | INTRAVENOUS | Status: AC
  Administered 2021-05-24: 2000 mg via INTRAVENOUS
  Filled 2021-05-24: qty 400

## 2021-05-24 NOTE — Progress Notes (Signed)
CSW attempted to visit the patient at bedside to introduce self as the heart failure social worker and to complete a very brief SDOH screening with the patient to address social needs as needed however the patient was unresponsive and unable to engage in conversation at this time.   CSW will continue to follow for discharge needs and will check back with the patient at another time.  Tremell Reimers, MSW, [place] Heart Failure Social Worker

## 2021-05-24 NOTE — Progress Notes (Signed)
Pharmacy Antibiotic Note  [name] is a 75 y.o. male admitted on 05/07/2021 with respiratory failure with hypoxia due to pneumonia.  Pt completed course of azithromycin/ceftriaxone (see below for dates). 6/7 CXR with worsening right opacity febrile and elevated wbc and he was placed on cefepime>meropenem and vancomycin on 6/7 - completed course 6/11 cultures  remained negative New fever 6/14, wbc starting to go back up 13>14, worsening Cxr on the L  Recheck cx, check PCT  Resume ABX  Cr stable 1.8 crcl 84ml/min   Plan: -start Meropenem 1gm IV q12h -Vancomycin 2gm x1 then vancomycin 1000mg  IV q24h -Will follow renal function, cultures and clinical progress   Height: 5\' 7"  (170.2 cm) Weight: 131.2 kg (289 lb 3.9 oz) IBW/kg (Calculated) : 66.1  Temp (24hrs), Avg:99.9 F (37.7 C), Min:98.42 F (36.9 C), Max:101.12 F (38.4 C)  Recent Labs  Lab 05/20/21 0402 05/20/21 2140 05/21/21 0500 05/21/21 2241 05/22/21 0425 05/23/21 0408 05/23/21 1600 05/24/21 0433  WBC 22.2*  --  18.8*  --  14.5* 13.3*  --  14.7*  CREATININE 1.83*  --  1.79* 1.63* 1.61* 1.50* 1.83* 1.85*  VANCOTROUGH  --  15  --   --   --   --   --   --   VANCOPEAK 30  --   --   --   --   --   --   --      Estimated Creatinine Clearance: 44.3 mL/min (A) (by C-G formula based on SCr of 1.85 mg/dL (H)).    Allergies  Allergen Reactions   Tussionex Pennkinetic Er [Hydrocod Polst-Cpm Polst Er] Other (See Comments)    UNSPECIFIED REACTION  > "caused prostate problems"   Ace Inhibitors Cough    Antimicrobials this admission: Azithromycin 5/30-6/3 Ceftriaxone 5/30-6/5 Vancomycin 6/7 >> 6/11 Cefepime 6/7 >> 6/9 Meropenem 6/9>>6/11  Microbiology results: 5/30 BCx X 2:  NG/final 5/30 Trach aspirate: normal respiratory flora 5/30 Urine cx: NG/final 5/30 MRSA PCR: negative 5/29 COVID, flu A, flu B: negative 6/7 Trach aspirate: normal flora 6/9 blood x2 ngF    Bonnita Nasuti Pharm.D. CPP, BCPS Clinical  Pharmacist [phone] 05/24/2021 9:54 AM   **Pharmacist phone directory can now be found on [email or web address] ([email or web address]).  Listed under [place].

## 2021-05-24 NOTE — Progress Notes (Signed)
ANTICOAGULATION CONSULT NOTE  Pharmacy Consult for  heparin  Indication: afib  Labs: Recent Labs    05/22/21 0425 05/22/21 1337 05/22/21 2309 05/23/21 0408 05/23/21 1600 05/24/21 0433  HGB 10.9*  --   --  10.3*  --  9.7*  HCT 35.7*  --   --  33.7*  --  32.8*  PLT 168  --   --  161  --  193  HEPARINUNFRC 0.13*   < > 0.31 0.29*  --  0.34  CREATININE 1.61*  --   --  1.50* 1.83* 1.85*   < > = values in this interval not displayed.    Assessment: 75 yo male with h/o Afib, Xarelto on hold, new thrombocytopenia on heparin and concern for HIT so was switched to argatroban. Heparin antibody was negative, so switched back to heparin on 05/14/21.  Heparin level came back slightly therapeutic at 0.34, on 1600 units/hr. Hgb 9.7, plt 193. No s/sx of bleeding or infusion issues.    Goal of Therapy:  Heparin level 0.3-0.7 units/ml Monitor platelets by anticoagulation protocol: Yes   Plan:  Continue IV heparin at 1600 units/hr. Daily heparin level and CBC F/u restart of DOAC when able  Thanks for allowing pharmacy to be a part of this patient's care.  Antonietta Jewel, PharmD, [place] Clinical Pharmacist  Phone: [phone] 05/24/2021 7:24 AM  Please check AMION for all [place] phone numbers After 10:00 PM, call [place] [phone]

## 2021-05-24 NOTE — Progress Notes (Signed)
PT Cancellation Note  Patient Details Name: [name] MRN: [id number] DOB: 05/08/1946   Cancelled Treatment:    Reason Eval/Treat Not Completed: Patient not medically ready (pt sedated and unable to participate)   Linea Calles B Rahm Minix 05/24/2021, 8:04 AM Bayard Males, PT Acute Rehabilitation Services Pager: [phone] Office: [phone]

## 2021-05-24 NOTE — Progress Notes (Signed)
Nutrition Follow-up  DOCUMENTATION CODES:   Obesity unspecified  INTERVENTION:   Continue Tube Feeding via OG:  Vital 1.5 at 55 ml/hr Pro-Source TF 90 mL TID Provides 2200 kcals, 155 g of protein and 1003 mL of free water   NUTRITION DIAGNOSIS:   Inadequate oral intake related to acute illness as evidenced by NPO status.  Being addressed via TF   GOAL:   Patient will meet greater than or equal to 90% of their needs  Met via TF  MONITOR:   Vent status, TF tolerance, Labs, Weight trends  REASON FOR ASSESSMENT:   Consult, Ventilator Enteral/tube feeding initiation and management  ASSESSMENT:   75 yo male admitted with acute respiratory failure, CHF, hx of lung cancer with 3 recurrences, AKI on CKD. PMH includes CHF, DM, CAD/CABG, HLD, COPD, GERD, PVD, GERD, hiatal hernia  5/30 Admitted, Intubated 6/13 TEE, Cardioversion  Pt remains on vent support, failing weaning trials Febrile, WBC trending up, workup ongoing. Levophed increased to 20 from 2 yesterday  Tolerating Vital 1.5 at 55 ml/hr via OG; free water flushes discontinued  Requiring insulin and lasix gtt. CBGs improved; weight trending back up; lowest weight 118.9 kg, current wt 131.2 kg  +BM, previously constipated, now with rectal tube now in place. Pt on scheduled senna, miralax, colace  DTI to buttocks; multiple skin tears  Labs: reviewed Meds: KCl   Diet Order:   Diet Order     None       EDUCATION NEEDS:   Not appropriate for education at this time  Skin:  Skin Assessment: Skin Integrity Issues: Skin Integrity Issues:: DTI DTI: buttocks  Last BM:  6/14 rectal tube  Height:   Ht Readings from Last 1 Encounters:  05/17/21 5' 7"  (1.702 m)    Weight:   Wt Readings from Last 1 Encounters:  05/24/21 131.2 kg     BMI:  Body mass index is 45.3 kg/m.  Estimated Nutritional Needs:   Kcal:  2020-2345 kcals  Protein:  135-170 g  Fluid:  >/= 1.8 L   Kerman Passey MS, RDN,  LDN, CNSC Registered Dietitian III Clinical Nutrition RD Pager and On-Call Pager Number Located in [place]

## 2021-05-24 NOTE — Progress Notes (Signed)
OT Cancellation Note  Patient Details Name: [name] MRN: [id number] DOB: [birth year]   Cancelled Treatment:    Reason Eval/Treat Not Completed: Patient not medically ready (pt sedated and unable to participate)    Layla Maw, OTR/L 05/24/2021, 8:12 AM

## 2021-05-24 NOTE — Progress Notes (Addendum)
Patient ID: [name], male   DOB: 1946-01-06, 75 y.o.   MRN: [id number]     Advanced Heart Failure Rounding Note  PCP-Cardiologist: None   Subjective:    Remains intubated and sedated. Underwent DCCV yesterday. Maintaining NSR.   On NE 20, Amio gtt at 60/hr  + heparin. Co-ox 81%   Only - 1.8L in UOP yesterday. CVP 15-16, weight up.   Creatinine 1.61 => 1.5=>1.9. BUN 103   Failed SBT, sedated and not responsive.   He has completed course of vancomycin/meropenem for possible PNA. Febrile overnight. mTemp 100.8, WBCs 14.7K.   TEE (6/13): EF 20-25%, mildly decreased RV systolic function   Objective:   Weight Range: 131.2 kg Body mass index is 45.3 kg/m.   Vital Signs:   Temp:  [98.42 F (36.9 C)-101.12 F (38.4 C)] 100 F (37.8 C) (06/14 0645) Pulse Rate:  [70-143] 70 (06/14 0645) Resp:  [16-40] 28 (06/14 0645) BP: (64-148)/(23-90) 81/30 (06/14 0645) SpO2:  [93 %-98 %] 98 % (06/14 0645) Arterial Line BP: (54-162)/(34-70) 127/40 (06/14 0645) FiO2 (%):  [40 %] 40 % (06/14 0344) Weight:  [131.2 kg] 131.2 kg (06/14 0246) Last BM Date:  (flexiseal)  Weight change: Filed Weights   05/22/21 0500 05/23/21 0500 05/24/21 0246  Weight: 128.9 kg 129.7 kg 131.2 kg    Intake/Output:   Intake/Output Summary (Last 24 hours) at 05/24/2021 0713 Last data filed at 05/24/2021 0600 Gross per 24 hour  Intake 3145.07 ml  Output 1913 ml  Net 1232.07 ml      Physical Exam    CVP 15-16  General: sedated Neck: JVP 14 cm, no thyromegaly or thyroid nodule.  Lungs: Decreased at bases.  CV: Nondisplaced PMI.  Heart regular S1/S2, no S3/S4, no murmur.  Trace ankle edema.  Abdomen: Soft, nontender, no hepatosplenomegaly, no distention.  Skin: Intact without lesions or rashes.  Neurologic: Sedated on vent.  Extremities: No clubbing or cyanosis.  HEENT: Normal.    Telemetry   NSR A-V paced 80s  (personally reviewed)   Labs    CBC Recent Labs    05/23/21 0408  05/24/21 0433  WBC 13.3* 14.7*  HGB 10.3* 9.7*  HCT 33.7* 32.8*  MCV 103.4* 104.5*  PLT 161 176   Basic Metabolic Panel Recent Labs    05/21/21 2241 05/22/21 0246 05/23/21 1600 05/24/21 0433  NA 141   < > 143 135  K 4.9   < > 5.5* 4.1  CL 114*   < > 109 104  CO2 19*   < > 20* 19*  GLUCOSE 198*   < > 259* 398*  BUN 85*   < > 96* 103*  CREATININE 1.63*   < > 1.83* 1.85*  CALCIUM 7.9*   < > 8.4* 7.8*  MG 2.9*  --   --   --    < > = values in this interval not displayed.   Liver Function Tests No results for input(s): AST, ALT, ALKPHOS, BILITOT, PROT, ALBUMIN in the last 72 hours. No results for input(s): LIPASE, AMYLASE in the last 72 hours. Cardiac Enzymes No results for input(s): CKTOTAL, CKMB, CKMBINDEX, TROPONINI in the last 72 hours.  BNP: BNP (last 3 results) Recent Labs    04/28/2021 2322 05/14/21 0500 05/16/21 0340  BNP 281.2* 216.4* 78.5    ProBNP (last 3 results) No results for input(s): PROBNP in the last 8760 hours.   D-Dimer No results for input(s): DDIMER in the last 72 hours. Hemoglobin A1C No  results for input(s): HGBA1C in the last 72 hours. Fasting Lipid Panel No results for input(s): CHOL, HDL, LDLCALC, TRIG, CHOLHDL, LDLDIRECT in the last 72 hours. Thyroid Function Tests No results for input(s): TSH, T4TOTAL, T3FREE, THYROIDAB in the last 72 hours.  Invalid input(s): FREET3  Other results:   Imaging    No results found.   Medications:     Scheduled Medications:  acetaminophen (TYLENOL) oral liquid 160 mg/5 mL  650 mg Per Tube Q6H   arformoterol  15 mcg Nebulization BID   aspirin  81 mg Per Tube Daily   atorvastatin  80 mg Per Tube Daily   bethanechol  10 mg Per Tube TID   budesonide (PULMICORT) nebulizer solution  0.5 mg Nebulization BID   chlorhexidine gluconate (MEDLINE KIT)  15 mL Mouth Rinse BID   Chlorhexidine Gluconate Cloth  6 each Topical Q0600   clonazePAM  1 mg Per Tube BID   docusate  100 mg Per Tube BID    feeding supplement (PROSource TF)  90 mL Per Tube TID   furosemide  80 mg Intravenous Q8H   Gerhardt's butt cream   Topical QID   levothyroxine  50 mcg Per Tube Q0600   mouth rinse  15 mL Mouth Rinse 10 times per day   pantoprazole sodium  40 mg Per Tube Daily   polyethylene glycol  17 g Per Tube Daily   revefenacin  175 mcg Nebulization Daily   senna  1 tablet Per Tube Daily   sodium chloride flush  10-40 mL Intracatheter Q12H   sodium chloride flush  10-40 mL Intracatheter Q12H   sodium chloride flush  3 mL Intravenous Q12H   sodium zirconium cyclosilicate  10 g Per Tube BID    Infusions:  sodium chloride 20 mL/hr at 05/21/21 1840   sodium chloride     sodium chloride     sodium chloride Stopped (05/22/21 1940)   sodium chloride Stopped (05/23/21 1004)   amiodarone 60 mg/hr (05/24/21 0600)   dexmedetomidine (PRECEDEX) IV infusion 0.5 mcg/kg/hr (05/24/21 0600)   feeding supplement (VITAL 1.5 CAL) 1,000 mL (05/23/21 1120)   heparin 1,600 Units/hr (05/24/21 0600)   insulin 11.5 Units/hr (05/24/21 0600)   norepinephrine (LEVOPHED) Adult infusion 16 mcg/min (05/24/21 0600)    PRN Medications: sodium chloride, Place/Maintain arterial line **AND** sodium chloride, acetaminophen (TYLENOL) oral liquid 160 mg/5 mL, dextrose, docusate, fentaNYL, fentaNYL (SUBLIMAZE) injection, metoprolol tartrate, sodium chloride flush, sodium chloride flush, sodium chloride flush  Assessment/Plan    Shock: Suspect combination of septic shock and cardiogenic shock in setting of recurrent AF.  Echo 05/12/21 EF  20-25% (previous echo EF 35-40%). Bcx NGTD.  Resp cx - rare GPC.  Atrial flutter/RVR likely played a role. S/p DCCV to NSR 6/13 (TEE with EF 20-25%, mild RV dysfunction). Increased NE requirements overnight and remains febrile. NE up to 20 today. Co-ox 81%, suspect significant vasodilatory shock component.  - Continue to wean NE as able.   - Re-culture and check PCT, d/w CCM reintroduction of  antibiotics.   2. 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 Echo 05/12/21 EF 20-25% (previous Echo EF 35-40%).  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 Last cath 1/19 (LAD is patent, other vessels occluded with collaterals from the LAD; SVG to OM and SVG to PDA chronically occluded).  Today, CVP 15-16 with co-ox 81%. Creatinine and BUN higher, 1.5=>1.9.  - Can consider  repeat cath in future but doubt ischemia is driving factor - Switch to lasix gtt at 12 mg/hr and follow response  - Wean off NE as able today.  3. Recurrent AF/AFL with RVR: H/o DCCV in 12/21. S/p DCCV 6/13. Maintaining NSR.  - Continue amiodarone gtt. Reduce to 30 mg/hr - Continue heparin gtt. - If unable to maintain NSR can consider AVN ablation as he already has CRT 4. Acute hypercapenic/hypoxic respiratory failure: H/o COPD. Remains on vent, failed SBT yesterday.  - CCM managing 5. AKI on CKD IV: Baseline creatinine 1.3-1.7.  Creatinine 1.85 today with BUN up to 103.  - Concerned we are going in the wrong direction here.  6. CAD: h/o CABG with occluded grafts.  Last cath 1/19 (LAD is patent, other vessels occluded with collaterals from the LAD; SVG to OM and SVG to PDA chronically occluded) - continue ASA/statin - Can consider repeat cath in future but doubt ischemia is driving factor 7. Thrombocytopenia: Resolved. - HIT negative 8. DM2 - per CCM  9. Urinary retention/hematuria: Foley had to be placed by Urology.  Suspect local Foley trauma.  - follow - check UA  10. COPD: Moderate to severe.  11. 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  12. Lung cancer: Had radiation.  13.  PAD: Occluded left SFA and significant right SFA stenosis on last CTA.  ABIs in 3/20 with significant disease bilaterally but stable.  14. ID: As above, concerned for septic shock component. He was treated with vancomycin/meropenem for possible PNA but has been off  antibiotics for a couple of days.  Now with worsening hypotension despite conversion to NSR.  Co-ox has been excellent, > 80%.  Concern for septic shock.  - Reculture.  - Send procalcitonin.  - ?Restart abx => d/w CCM.    CRITICAL CARE Performed by: [name]  Total critical care time: 40 minutes  Critical care time was exclusive of separately billable procedures and treating other patients.  Critical care was necessary to treat or prevent imminent or life-threatening deterioration.  Critical care was time spent personally by me on the following activities: development of treatment plan with patient and/or surrogate as well as nursing, discussions with consultants, evaluation of patient's response to treatment, examination of patient, obtaining history from patient or surrogate, ordering and performing treatments and interventions, ordering and review of laboratory studies, ordering and review of radiographic studies, pulse oximetry and re-evaluation of patient's condition.  [name] 05/24/2021 7:45 AM  Advanced Heart Failure Team Pager [phone] (M-F; 7a - 5p)  Please contact [facility] for night-coverage after hours (5p -7a ) and weekends on [email or web address]

## 2021-05-24 NOTE — Progress Notes (Signed)
NAME:  JERZY ROEPKE, MRN:  [id number], DOB:  09-10-1946, LOS: 23 ADMISSION DATE:  04/23/2021, CONSULTATION DATE:  05/09/2021 REFERRING MD:  Dr. Randal Buba, CHIEF COMPLAINT:  SOB  History of Present Illness:  Mr. Blatt is a 75 yo M with past medical hx as below presenting from home with complaints of SOB/ respiratory distress on 5/29.  Treated by EMS with duonebs, epi, solumedrol, and mag without improvement, requiring BVM.  On arrival to ER, patient required intubation as he was peri- arrest, unresponsive, bradycardic, temp 95.9, hypotensive, and apneic with diffuse apical wheeze requiring emergent intubation on arrival.  Noted to have pink frothy secretions on intubation.  PCCM called for admit.   Pertinent  Medical History  Morbid obesity, HFrEF (12/24/20 EF 40-45%, G2DD, PAF, mildly reduced systolic RV, mild MR), VT on amio/ ICD, CAD s/p CABG 1996, LBBB, HLD, COPD, OSA on CPAP, AAA (s/p repair 2015), NSCL lung CA s/p XRT 2018, anemia, BPH, DMT2, GERD, hiatal hernia, PVD, tremor  Significant Hospital Events: Including procedures, antibiotic start and stop dates in addition to other pertinent events   5/30 admitted for respiratory failure/ intubated, empiric CAP coverage/ duresis 6/1 BLLE Duplex> no DVT, unable to complete thoracentesis due to anatomy  6/2 Tmax 101, -2.9L in last 24 hours, on levo at 67mcg, precedex. Resp culture negative. BC pending. 6/3 switched from heparin to argatroban for thrombocytopenia, HIT ab pending 6/4 HIT AB negative, switched back to heparin, failed SBT due to tachypnea 6/7 afib w/ RVR, HR increased abruptly to 140s from 90s, received amio bolus and started on drip, got fentanyl and versed, started cefepime and vanco, d/c norepi, receiving vasopressin, precedex, phenylephrine 6/9 - Stopped cefepime and started meropenem, continued vanco due to concern for cefepime- associated drug rash 6/12 unable to extubate. Afib RVR worse 6/13: on mech vent; Levo at 2; 60 Amio;  Afib RVR persist; TEE and cardioverted 150 J into sinus rhythm and HR in 80s 6/14: Levo increased to 20; WBC and fever trending up; cultures sent; started back on meropenem and vanc  Interim History / Subjective:   Patient remains on mech vent: PRVC On Levo 20 (from 2 yesterday), 30 Amio, Precedex, Heparin and Insulin drip running HR in 80s paced rhythm WBC 14.7 slowly trending up; Tmax 100 Glucose range 112-231 CVP 16 and UO decreasing to -1,813 in last 24 hours (yesterday last 24 hours -3,210)   Objective   Blood pressure (!) 81/30, pulse 70, temperature 100 F (37.8 C), resp. rate (!) 28, height 5\' 7"  (1.702 m), weight 131.2 kg, SpO2 98 %. CVP:  [5 mmHg-30 mmHg] 10 mmHg  Vent Mode: PRVC FiO2 (%):  [40 %] 40 % Set Rate:  [24 bmp] 24 bmp Vt Set:  [530 mL-5830 mL] 530 mL PEEP:  [5 cmH20] 5 cmH20 Pressure Support:  [5 cmH20] 5 cmH20 Plateau Pressure:  [15 cmH20-29 cmH20] 29 cmH20   Intake/Output Summary (Last 24 hours) at 05/24/2021 0711 Last data filed at 05/24/2021 0600 Gross per 24 hour  Intake 3145.07 ml  Output 1913 ml  Net 1232.07 ml     Filed Weights   05/22/21 0500 05/23/21 0500 05/24/21 0246  Weight: 128.9 kg 129.7 kg 131.2 kg    Examination: General:  Critically ill intubated patient HEENT: MM pink/moist; ET tube in place Neuro: sedate on precedex; cough/gag reflex  CV: s1s2, regular rate with paced rhythm, no m/r/g PULM:  dim clear BS bilaterally, on mech vent PRVC GI: soft, bsx4 active  Extremities:  warm/dry, dependent UE and LE edema  Skin: no rashes or lesions   Labs/imaging that I havepersonally reviewed  (right click and "Reselect all SmartList Selections" daily)  K 4.1 Glucose range 112-231 BUN 103, Creatinine 1.85 (from 1.50) WBC 14.7 from 13.3 Hgb 9.7  Resolved Hospital Problem list    Thrombocytopenia 4T score= 4, intermediate risk for HIT, although B- lactams can also decrease platelets. HIT Ab negative Hypernatremia  Assessment & Plan:    Acute hypoxic/ hypercarbic respiratory failure  Hx of COPD Patient had multifocal infiltrates, procalcitonin was elevated Concern for pneumonia however that was treated developed higher fever concern for potential secondary infection with line Or Foley which were both removed and exchanged Plan: -Continue mech vent support 8cc/kg -Daily SBT -Continue Pulmicort/brovana/yupelri -VAP prevention in place  Septic Shock Plan: -Will restart meropenem and vanc (no cefepime due to allergic reaction with rash) -BC, UA, CXR, procalcitonin, trach aspirate, cortisol, MRSA pcr sent -Trend CBC/fever curve -increasing pressor requirements today: wean Levo for MAP >65; consider starting vaso  HFrEF (12/24/20 EF 40-45%, G2DD, mildly reduced systolic RV, mild MR) >> LVEF down to 20-25% this admission - Hx of VT on amio w/ICD, CAD s/p CABG 1996, LBBB, HTN, PAF Atrial fibrillation with RVR: TEE and cardioverted 6/13 with 150 J; patient rhythm in 80s and regular paced rhythm Plan: -cards and HF following -wean Levo for MAP > 65 -Picc line in place for coox -consider lasix drip due to decreasing UO and increasing CVP  CKD 3b, hx BPH, phimosis on PTA, finasteride  Phimosis required bedside dilation by urology for foley placement 5/30; Remove foley 6/8 due to concern for infection; replaced 6/9 by urology Plan: -Continue to monitor BMP and UOP -Keep foley  Hyperglycemia, uncontrolled Hx T2DM, A1C 5.9 05/09/21 Plan: -continue insulin drip -CBG monitoring for goal of 140-180 -continue tube feeds per dietician  Constipation Plan: -Continue bowel regimen   Chronic Conditions:   Hypothyroidism, stable Plan: -Continue synthroid  Class 3 obesity, stable Plan: -Education when able   Baseline anxiety disorder Plan: -Klonopin BID    Best practice (right click and "Reselect all SmartList Selections" daily)  Diet:  TF Pain/Anxiety/Delirium protocol (if indicated): Yes (RASS goal 0) VAP  protocol (if indicated): Yes DVT prophylaxis: Systemic AC GI prophylaxis: PPI Glucose control:  Insulin Drip Central venous access:  Yes, and it is still needed Arterial line:  Yes Foley:  Yes Mobility:  bed rest  PT consulted: [name] Last date of multidisciplinary goals of care discussion: (6/13: updated wife and daughter at bedside; patient sedated on ventilator) Code Status:  full code Disposition: ICU  This patient is critically ill with multiple organ system failure; which, requires frequent high complexity decision making, assessment, support, evaluation, and titration of therapies. This was completed through the application of advanced monitoring technologies and extensive interpretation of multiple databases. During this encounter critical care time was devoted to patient care services described in this note for 35 minutes.  JD Rexene Agent [facility] 05/24/2021, 7:11 AM  Please see [email or web address] for pager details.  From 7A-7P if no response, please call [phone]. After hours, please call ELink [phone].

## 2021-05-25 ENCOUNTER — Inpatient Hospital Stay (HOSPITAL_COMMUNITY): Payer: Medicare Other

## 2021-05-25 ENCOUNTER — Telehealth: Payer: Self-pay | Admitting: Internal Medicine

## 2021-05-25 LAB — BASIC METABOLIC PANEL
Anion gap: 15 (ref 5–15)
BUN: 138 mg/dL — ABNORMAL HIGH (ref 8–23)
CO2: 16 mmol/L — ABNORMAL LOW (ref 22–32)
Calcium: 8.3 mg/dL — ABNORMAL LOW (ref 8.9–10.3)
Chloride: 110 mmol/L (ref 98–111)
Creatinine, Ser: 2.65 mg/dL — ABNORMAL HIGH (ref 0.61–1.24)
GFR, Estimated: 24 mL/min — ABNORMAL LOW (ref >60)
Glucose, Bld: 192 mg/dL — ABNORMAL HIGH (ref 70–99)
Potassium: 5.4 mmol/L — ABNORMAL HIGH (ref 3.5–5.1)
Sodium: 141 mmol/L (ref 135–145)

## 2021-05-25 LAB — BLOOD CULTURE ID PANEL (REFLEXED) - BCID2

## 2021-05-25 LAB — GLUCOSE, CAPILLARY
Glucose-Capillary: 153 mg/dL — ABNORMAL HIGH (ref 70–99)
Glucose-Capillary: 156 mg/dL — ABNORMAL HIGH (ref 70–99)
Glucose-Capillary: 163 mg/dL — ABNORMAL HIGH (ref 70–99)
Glucose-Capillary: 165 mg/dL — ABNORMAL HIGH (ref 70–99)
Glucose-Capillary: 165 mg/dL — ABNORMAL HIGH (ref 70–99)
Glucose-Capillary: 171 mg/dL — ABNORMAL HIGH (ref 70–99)
Glucose-Capillary: 173 mg/dL — ABNORMAL HIGH (ref 70–99)
Glucose-Capillary: 175 mg/dL — ABNORMAL HIGH (ref 70–99)
Glucose-Capillary: 176 mg/dL — ABNORMAL HIGH (ref 70–99)
Glucose-Capillary: 177 mg/dL — ABNORMAL HIGH (ref 70–99)
Glucose-Capillary: 178 mg/dL — ABNORMAL HIGH (ref 70–99)
Glucose-Capillary: 194 mg/dL — ABNORMAL HIGH (ref 70–99)
Glucose-Capillary: 196 mg/dL — ABNORMAL HIGH (ref 70–99)
Glucose-Capillary: 201 mg/dL — ABNORMAL HIGH (ref 70–99)
Glucose-Capillary: 204 mg/dL — ABNORMAL HIGH (ref 70–99)
Glucose-Capillary: 94 mg/dL (ref 70–99)

## 2021-05-25 LAB — CBC
HCT: 33 % — ABNORMAL LOW (ref 39.0–52.0)
Hemoglobin: 9.9 g/dL — ABNORMAL LOW (ref 13.0–17.0)
MCH: 30.8 pg (ref 26.0–34.0)
MCHC: 30 g/dL (ref 30.0–36.0)
MCV: 102.8 fL — ABNORMAL HIGH (ref 80.0–100.0)
Platelets: 187 10*3/uL (ref 150–400)
RBC: 3.21 MIL/uL — ABNORMAL LOW (ref 4.22–5.81)
RDW: 14.6 % (ref 11.5–15.5)
WBC: 16.3 10*3/uL — ABNORMAL HIGH (ref 4.0–10.5)
nRBC: 0 % (ref 0.0–0.2)

## 2021-05-25 LAB — RENAL FUNCTION PANEL
Albumin: 1.9 g/dL — ABNORMAL LOW (ref 3.5–5.0)
Anion gap: 12 (ref 5–15)
BUN: 132 mg/dL — ABNORMAL HIGH (ref 8–23)
CO2: 18 mmol/L — ABNORMAL LOW (ref 22–32)
Calcium: 8.1 mg/dL — ABNORMAL LOW (ref 8.9–10.3)
Chloride: 111 mmol/L (ref 98–111)
Creatinine, Ser: 2.33 mg/dL — ABNORMAL HIGH (ref 0.61–1.24)
GFR, Estimated: 28 mL/min — ABNORMAL LOW (ref >60)
Glucose, Bld: 211 mg/dL — ABNORMAL HIGH (ref 70–99)
Phosphorus: 7.3 mg/dL — ABNORMAL HIGH (ref 2.5–4.6)
Potassium: 5.1 mmol/L (ref 3.5–5.1)
Sodium: 141 mmol/L (ref 135–145)

## 2021-05-25 LAB — COOXEMETRY PANEL
Carboxyhemoglobin: 0.7 % (ref 0.5–1.5)
Methemoglobin: 1.3 % (ref 0.0–1.5)
O2 Saturation: 81 %
Total hemoglobin: 10.1 g/dL — ABNORMAL LOW (ref 12.0–16.0)

## 2021-05-25 LAB — PROCALCITONIN: Procalcitonin: 0.49 ng/mL

## 2021-05-25 LAB — MAGNESIUM: Magnesium: 2.8 mg/dL — ABNORMAL HIGH (ref 1.7–2.4)

## 2021-05-25 LAB — HEPARIN LEVEL (UNFRACTIONATED): Heparin Unfractionated: 0.32 IU/mL (ref 0.30–0.70)

## 2021-05-25 MED ORDER — SODIUM BICARBONATE 650 MG PO TABS
650.0000 mg | ORAL_TABLET | Freq: Two times a day (BID) | ORAL | Status: DC
  Administered 2021-05-25: 650 mg via ORAL
  Filled 2021-05-25: qty 1

## 2021-05-25 MED ORDER — SODIUM ZIRCONIUM CYCLOSILICATE 10 G PO PACK
10.0000 g | PACK | Freq: Once | ORAL | Status: AC
  Administered 2021-05-25: 10 g via ORAL
  Filled 2021-05-25: qty 1

## 2021-05-25 MED ORDER — SODIUM CHLORIDE 0.9 % FOR CRRT: INTRAVENOUS_CENTRAL | Status: DC | PRN

## 2021-05-25 MED ORDER — SODIUM CHLORIDE 0.9 % IV SOLN
1.0000 g | Freq: Three times a day (TID) | INTRAVENOUS | Status: AC
  Administered 2021-05-25 – 2021-05-30 (×16): 1 g via INTRAVENOUS
  Filled 2021-05-25 (×16): qty 1

## 2021-05-25 MED ORDER — PRISMASOL BGK 4/2.5 32-4-2.5 MEQ/L REPLACEMENT SOLN: Status: DC

## 2021-05-25 MED ORDER — HEPARIN SODIUM (PORCINE) 1000 UNIT/ML DIALYSIS
1000.0000 [IU] | INTRAMUSCULAR | Status: DC | PRN
  Administered 2021-05-27 – 2021-05-31 (×3): 2400 [IU] via INTRAVENOUS_CENTRAL
  Filled 2021-05-25: qty 2
  Filled 2021-05-25 (×3): qty 3
  Filled 2021-05-25 (×5): qty 6
  Filled 2021-05-25: qty 1

## 2021-05-25 MED ORDER — VANCOMYCIN HCL 1500 MG/300ML IV SOLN
1500.0000 mg | INTRAVENOUS | Status: DC
  Administered 2021-05-25: 1500 mg via INTRAVENOUS
  Filled 2021-05-25: qty 300

## 2021-05-25 MED ORDER — CHLORHEXIDINE GLUCONATE 0.12 % MT SOLN
OROMUCOSAL | Status: AC
  Administered 2021-05-25: 15 mL via OROMUCOSAL
  Filled 2021-05-25: qty 15

## 2021-05-25 MED ORDER — VANCOMYCIN VARIABLE DOSE PER UNSTABLE RENAL FUNCTION (PHARMACIST DOSING): Status: DC

## 2021-05-25 MED ORDER — PRISMASOL BGK 4/2.5 32-4-2.5 MEQ/L EC SOLN: Status: DC

## 2021-05-25 MED ORDER — HEPARIN SODIUM (PORCINE) 1000 UNIT/ML IJ SOLN
3000.0000 [IU] | Freq: Once | INTRAMUSCULAR | Status: AC
  Administered 2021-05-25: 3000 [IU] via INTRAVENOUS

## 2021-05-25 NOTE — Progress Notes (Signed)
NAME:  [name], MRN:  [id number], DOB:  [birth year], LOS: 69 ADMISSION DATE:  04/14/2021, CONSULTATION DATE:  05/09/2021 REFERRING MD:  Dr. Randal Buba, CHIEF COMPLAINT:  SOB  History of Present Illness:  [name] is a 75 yo M with past medical hx as below presenting from home with complaints of SOB/ respiratory distress on 5/29.  Treated by EMS with duonebs, epi, solumedrol, and mag without improvement, requiring BVM.  On arrival to ER, patient required intubation as he was peri- arrest, unresponsive, bradycardic, temp 95.9, hypotensive, and apneic with diffuse apical wheeze requiring emergent intubation on arrival.  Noted to have pink frothy secretions on intubation.  PCCM called for admit.   Pertinent  Medical History  Morbid obesity, HFrEF (12/24/20 EF 40-45%, G2DD, PAF, mildly reduced systolic RV, mild MR), VT on amio/ ICD, CAD s/p CABG 1996, LBBB, HLD, COPD, OSA on CPAP, AAA (s/p repair 2015), NSCL lung CA s/p XRT 2018, anemia, BPH, DMT2, GERD, hiatal hernia, PVD, tremor  Significant Hospital Events: Including procedures, antibiotic start and stop dates in addition to other pertinent events   5/30 admitted for respiratory failure/ intubated, empiric CAP coverage/ duresis 6/1 BLLE Duplex> no DVT, unable to complete thoracentesis due to anatomy  6/2 Tmax 101, -2.9L in last 24 hours, on levo at 52mcg, precedex. Resp culture negative. BC pending. 6/3 switched from heparin to argatroban for thrombocytopenia, HIT ab pending 6/4 HIT AB negative, switched back to heparin, failed SBT due to tachypnea 6/7 afib w/ RVR, HR increased abruptly to 140s from 90s, received amio bolus and started on drip, got fentanyl and versed, started cefepime and vanco, d/c norepi, receiving vasopressin, precedex, phenylephrine 6/9 - Stopped cefepime and started meropenem, continued vanco due to concern for cefepime- associated drug rash 6/12 unable to extubate. Afib RVR worse 6/13: on mech vent; Levo at 2; 60 Amio;  Afib RVR persist; TEE and cardioverted 150 J into sinus rhythm and HR in 80s 6/14: Levo increased to 20; WBC and fever trending up; cultures sent; started back on meropenem and vanc 6/15: creatine function worsening; nephrology consulted  Interim History / Subjective:   Intubated on mech vent: PS 16/5 40% On Levo 14 (20 yesterday), 30 Amio, Precedex, heparin, and insulin drip Glucose range 162-178 UOP -2,065 last 24 hours (-1,813 yesterday); CVP 18 WBC trending up 16.3; fever trending down to 98.7   Objective   Blood pressure (!) 106/26, pulse 70, temperature 98.78 F (37.1 C), resp. rate (!) 25, height 5\' 7"  (1.702 m), weight 134.4 kg, SpO2 100 %. CVP:  [7 mmHg-18 mmHg] 10 mmHg  Vent Mode: PRVC FiO2 (%):  [40 %] 40 % Set Rate:  [24 bmp] 24 bmp Vt Set:  [530 mL] 530 mL PEEP:  [5 cmH20] 5 cmH20 Plateau Pressure:  [18 cmH20-32 cmH20] 25 cmH20   Intake/Output Summary (Last 24 hours) at 05/25/2021 0739 Last data filed at 05/25/2021 0600 Gross per 24 hour  Intake 3856.97 ml  Output 2165 ml  Net 1691.97 ml     Filed Weights   05/23/21 0500 05/24/21 0246 05/25/21 0500  Weight: 129.7 kg 131.2 kg 134.4 kg    Examination:  General:  Critically ill intubated patient HEENT: MM pink/moist; ET and OG tube in place Neuro: sedate on precedex; cough/gag reflex  CV: s1s2, regular rate with paced rhythm, no m/r/g PULM:  scattered rhonchi bilaterally, on mech vent PS 16/5 40% GI: soft, bsx4 active  Extremities: warm/dry Skin: no rashes or lesions   Labs/imaging  that I havepersonally reviewed  (right click and "Reselect all SmartList Selections" daily)   K 5.4, mag 2.8 CO2 16, Anion Gap 15 Creat 2.65 (2.48) BUN 138 (130), GFR 24; UOP -2065 last 24 hours WBC 16.3 (14.7) Hgb 9.9 Glucose range 162-178  BC and resp culture pending MRSA PCR negative CXR: LLL opacity (likely pneumonia) UA: large leukocytes   Resolved Hospital Problem list    Thrombocytopenia 4T score= 4,  intermediate risk for HIT, although B- lactams can also decrease platelets. HIT Ab negative Hypernatremia  Assessment & Plan:   Acute hypoxic/ hypercarbic respiratory failure  Hx of COPD Patient had multifocal infiltrates, procalcitonin was elevated Concern for pneumonia however that was treated developed higher fever concern for potential secondary infection with line Or Foley which were both removed and exchanged Plan: -Continue mech vent support 8cc/kg -Daily SBT -Continue Pulmicort/brovana/yupelri -VAP prevention in place -Precedex for sedation  Septic Shock: MRSA PCR negative; BC and resp culture 6/14 pending; CXR LLL opacity; UTI Plan: -Will dc vanc; continue meropenem (no cefepime due to allergic reaction with rash) -BC and trach culture pending -Trend CBC/fever curve -Wean Levo for MAP >65  HFrEF (12/24/20 EF 40-45%, G2DD, mildly reduced systolic RV, mild MR) >> LVEF down to 20-25% this admission - Hx of VT on amio w/ICD, CAD s/p CABG 1996, LBBB, HTN, PAF Atrial fibrillation with RVR: TEE and cardioverted 6/13 with 150 J; patient rhythm in 80s and regular paced rhythm Plan: -cards and HF following -wean Levo for MAP > 65 -Picc line in place for coox -plan for CRRT today due to worsening creatinine, UOP, and increasing CVP  AKI on CKD 3b, hx BPH, phimosis on PTA, finasteride  Phimosis required bedside dilation by urology for foley placement 5/30; Remove foley 6/8 due to concern for infection; replaced 6/9 by urology Hyperkalemia Plan: -Nephro following: plan for HD cath placement and CRRT today due to worsening creatinine, UOP, and increasing CVP -Lokelma given this morning; recheck BMP this afternoon -Continue to monitor BMP and UOP -Keep foley  Hyperglycemia, uncontrolled Hx T2DM, A1C 5.9 05/09/21 Plan: -continue insulin drip -CBG monitoring for goal of 140-180 -continue tube feeds per dietician  Constipation (improving) Plan: -Continue bowel  regimen  Chronic Conditions:   Hypothyroidism, stable Plan: -Continue synthroid  Class 3 obesity, stable Plan: -Education when able   Baseline anxiety disorder Plan: -Klonopin BID    Best practice (right click and "Reselect all SmartList Selections" daily)  Diet:  TF Pain/Anxiety/Delirium protocol (if indicated): Yes (RASS goal 0) VAP protocol (if indicated): Yes DVT prophylaxis: Systemic AC GI prophylaxis: PPI Glucose control:  Insulin Drip Central venous access:  Yes, and it is still needed Arterial line:  Yes Foley:  Yes Mobility:  bed rest  PT consulted: [name] Last date of multidisciplinary goals of care discussion: (6/15 patient daughter updated at bedside. Provided consent for HD cath placement today) Code Status:  full code Disposition: ICU  This patient is critically ill with multiple organ system failure; which, requires frequent high complexity decision making, assessment, support, evaluation, and titration of therapies. This was completed through the application of advanced monitoring technologies and extensive interpretation of multiple databases. During this encounter critical care time was devoted to patient care services described in this note for 35 minutes.  JD Rexene Agent [facility] 05/25/2021, 7:39 AM  Please see [email or web address] for pager details.  From 7A-7P if no response, please call [phone]. After hours, please call ELink [phone].

## 2021-05-25 NOTE — Progress Notes (Addendum)
Patient ID: [name], male   DOB: [birth year], 75 y.o.   MRN: [id number]     Advanced Heart Failure Rounding Note  PCP-Cardiologist: None   Subjective:    Remains intubated and sedated. Underwent DCCV 6/13. Maintaining NSR.   Abx restarted yesterday, on vanc + meropenem. CXR w/ increasing LLL opacity.  AF. WBC slightly higher, 14>>16K.    On NE 16, Amio gtt at 30/hr  + heparin. Co-ox 81%   -2L in UOP on lasix gtt but net + 1.7L for the day. Wt up 7 lbs with CVP 12.    Creatinine rising 1.61 => 1.5=>1.92.=>2.1=>2.5=>2.7. BUN now 130. K 5.4   TEE (6/13): EF 20-25%, mildly decreased RV systolic function   Objective:   Weight Range: 134.4 kg Body mass index is 46.41 kg/m.   Vital Signs:   Temp:  [97.7 F (36.5 C)-100.4 F (38 C)] 98.78 F (37.1 C) (06/15 0615) Pulse Rate:  [26-104] 70 (06/15 0615) Resp:  [19-37] 25 (06/15 0615) BP: (91-144)/(22-107) 106/26 (06/15 0300) SpO2:  [93 %-100 %] 100 % (06/15 0615) Arterial Line BP: (75-181)/(31-71) 129/42 (06/15 0615) FiO2 (%):  [40 %] 40 % (06/15 0600) Weight:  [134.4 kg] 134.4 kg (06/15 0500) Last BM Date: 05/24/21  Weight change: Filed Weights   05/23/21 0500 05/24/21 0246 05/25/21 0500  Weight: 129.7 kg 131.2 kg 134.4 kg    Intake/Output:   Intake/Output Summary (Last 24 hours) at 05/25/2021 0714 Last data filed at 05/25/2021 0600 Gross per 24 hour  Intake 3856.97 ml  Output 2165 ml  Net 1691.97 ml      Physical Exam    CVP 6-7  General:  ill appearing, intubated and sedated  HEENT: + ETT normal Neck: supple. Thick neck, JVD now well visualized Carotids 2+ bilat; no bruits. No lymphadenopathy or thyromegaly appreciated. Cor: PMI nondisplaced. Regular rate & rhythm. No rubs, gallops or murmurs. Lungs: intubated and clear Abdomen: obese, soft, nontender, nondistended. No hepatosplenomegaly. No bruits or masses. Good bowel sounds. Extremities: no cyanosis, clubbing, rash, 1+ bilateral LE edema +  SCDs Neuro: intubated and sedated.   Telemetry   NSR A-V paced 80s  (personally reviewed)   Labs    CBC Recent Labs    05/24/21 0433 05/25/21 0431  WBC 14.7* 16.3*  HGB 9.7* 9.9*  HCT 32.8* 33.0*  MCV 104.5* 102.8*  PLT 193 283   Basic Metabolic Panel Recent Labs    05/24/21 2124 05/25/21 0431  NA 140 141  K 5.3* 5.4*  CL 110 110  CO2 17* 16*  GLUCOSE 177* 192*  BUN 130* 138*  CREATININE 2.48* 2.65*  CALCIUM 8.3* 8.3*  MG  --  2.8*   Liver Function Tests No results for input(s): AST, ALT, ALKPHOS, BILITOT, PROT, ALBUMIN in the last 72 hours. No results for input(s): LIPASE, AMYLASE in the last 72 hours. Cardiac Enzymes No results for input(s): CKTOTAL, CKMB, CKMBINDEX, TROPONINI in the last 72 hours.  BNP: BNP (last 3 results) Recent Labs    04/10/2021 2322 05/14/21 0500 05/16/21 0340  BNP 281.2* 216.4* 78.5    ProBNP (last 3 results) No results for input(s): PROBNP in the last 8760 hours.   D-Dimer No results for input(s): DDIMER in the last 72 hours. Hemoglobin A1C No results for input(s): HGBA1C in the last 72 hours. Fasting Lipid Panel No results for input(s): CHOL, HDL, LDLCALC, TRIG, CHOLHDL, LDLDIRECT in the last 72 hours. Thyroid Function Tests No results for input(s): TSH, T4TOTAL, T3FREE, THYROIDAB  in the last 72 hours.  Invalid input(s): FREET3  Other results:   Imaging    DG CHEST PORT 1 VIEW  Result Date: 05/24/2021 CLINICAL DATA:  75 year old male with respiratory failure, hypoxia. Treated right lung cancer. EXAM: PORTABLE CHEST 1 VIEW COMPARISON:  Portable chest 05/22/2021 and earlier. FINDINGS: Portable AP semi upright view at 0755 hours. Stable left chest AICD. Stable cardiomegaly and mediastinal contours. Intubated. Endotracheal tube tip between the clavicles and carina. Enteric tube courses to the abdomen, tip not included. Stable right PICC line. Stable surgical clips and architectural distortion in the right upper lobe.  Stable similar architectural distortion in the left upper lobe. No pneumothorax or pulmonary edema. No definite pleural effusion. But increasing retrocardiac opacity partially obscuring the left hemidiaphragm now. IMPRESSION: 1.  Stable lines and tubes. 2. Increasing left lower lobe opacity since 05/22/2021 could be pneumonia or recurrent atelectasis. 3. Stable architectural distortion in both upper lobes. Electronically Signed   By: Genevie Ann M.D.   On: 05/24/2021 08:18     Medications:     Scheduled Medications:  arformoterol  15 mcg Nebulization BID   aspirin  81 mg Per Tube Daily   atorvastatin  80 mg Per Tube Daily   bethanechol  10 mg Per Tube TID   budesonide (PULMICORT) nebulizer solution  0.5 mg Nebulization BID   chlorhexidine gluconate (MEDLINE KIT)  15 mL Mouth Rinse BID   Chlorhexidine Gluconate Cloth  6 each Topical Q0600   clonazePAM  1 mg Per Tube BID   docusate  100 mg Per Tube BID   feeding supplement (PROSource TF)  90 mL Per Tube TID   Gerhardt's butt cream   Topical QID   levothyroxine  50 mcg Per Tube Q0600   mouth rinse  15 mL Mouth Rinse 10 times per day   pantoprazole sodium  40 mg Per Tube Daily   polyethylene glycol  17 g Per Tube Daily   revefenacin  175 mcg Nebulization Daily   senna  1 tablet Per Tube Daily   sodium chloride flush  10-40 mL Intracatheter Q12H    Infusions:  sodium chloride 20 mL/hr at 05/21/21 1840   sodium chloride     amiodarone 30 mg/hr (05/25/21 0600)   dexmedetomidine (PRECEDEX) IV infusion 0.8 mcg/kg/hr (05/25/21 0656)   feeding supplement (VITAL 1.5 CAL) 1,000 mL (05/24/21 0722)   furosemide (LASIX) 200 mg in dextrose 5% 100 mL (68m/mL) infusion 12 mg/hr (05/25/21 0600)   heparin 1,600 Units/hr (05/25/21 0600)   insulin 9 Units/hr (05/25/21 0600)   meropenem (MERREM) IV Stopped (05/24/21 2242)   norepinephrine (LEVOPHED) Adult infusion 16 mcg/min (05/25/21 0600)   vancomycin      PRN Medications: Place/Maintain arterial  line **AND** sodium chloride, acetaminophen (TYLENOL) oral liquid 160 mg/5 mL, dextrose, fentaNYL, fentaNYL (SUBLIMAZE) injection, metoprolol tartrate, sodium chloride flush  Assessment/Plan    Shock: Suspect combination of septic shock and cardiogenic shock in setting of recurrent AF.  Echo 05/12/21 EF  20-25% (previous echo EF 35-40%). Bcx NGTD.  Resp cx - rare GPC.  Atrial flutter/RVR likely played a role. S/p DCCV to NSR 6/13 (TEE with EF 20-25%, mild RV dysfunction). Suspect significant vasodilatory shock component. Covering w/ vanc + mero. On NE 16. Co-ox 81%.  - Continue to wean NE as able.   - Continue abx  2. 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 Echo 05/12/21 EF 20-25% (previous Echo EF 35-40%).  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 Last cath 1/19 (LAD is patent, other vessels occluded with collaterals from the LAD; SVG to OM and SVG to PDA chronically occluded).  Co-ox 81% on NE. CVP 12 and wt up 7 lbs, suspect 3rd spacing. Creatinine and BUN higher, 2.7/130  - consult renal, concerned he may need CVVH.  - Wean off NE as able today.  - Can consider repeat cath in future, if renal function improves, but doubt ischemia is driving factor 3. Recurrent AF/AFL with RVR: H/o DCCV in 12/21. S/p DCCV 6/13. Maintaining NSR.  - Continue amiodarone gtt, 30 mg/hr - Continue heparin gtt. - If unable to maintain NSR can consider AVN ablation as he already has CRT 4. Acute hypercapenic/hypoxic respiratory failure: H/o COPD. Remains on vent, failed SBT - CCM managing 5. AKI on CKD IV: Baseline creatinine 1.3-1.7.  Creatinine 2.7 today with BUN up to 138  - Concerned we are going in the wrong direction here.  - Consult renal as he may need CVVH.  6. CAD: h/o CABG with occluded grafts.  Last cath 1/19 (LAD is patent, other vessels occluded with collaterals from the LAD; SVG to OM and SVG to PDA  chronically occluded) - continue ASA/statin - Can consider repeat cath in future but doubt ischemia is driving factor 7. Thrombocytopenia: Resolved. - HIT negative 8. DM2 - per CCM  9. Urinary retention/hematuria: Foley had to be placed by Urology.  Suspect local Foley trauma.  - follow 10. COPD: Moderate to severe.  11. 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  12. Lung cancer: Had radiation.  13.  PAD: Occluded left SFA and significant right SFA stenosis on last CTA.  ABIs in 3/20 with significant disease bilaterally but stable.  14. ID: As above, concerned for septic shock component.  - c/w vancomycin/meropenem for now  - cultures pending  - PCT 0.49    Lyda Jester, PA-C 05/25/2021 7:14 AM  Advanced Heart Failure Team Pager [phone] (M-F; 7a - 5p)  Please contact [facility] for night-coverage after hours (5p -7a ) and weekends on [email or web address]   Patient seen with PA, agree with the above note.   He remains on vent, failed SBT yesterday.  BUN/creatinine continue to rise and weight up 7 lbs. CVP 12 today, K 5.4.  He remains on NE 16 with stable MAP and co-ox 81%.   Now back on vancomyin/meropenem for suspected septic shock.   He is on Lasix gtt, making urine but I>O.    In NSR on amiodarone gtt and heparin gtt.   General: Intubated/sedated.  Neck: JVP 12+ cm, no thyromegaly or thyroid nodule.  Lungs: Decreased at bases.  CV: Nondisplaced PMI.  Heart regular S1/S2, no S3/S4, no murmur.  1+ ankle edema.  Abdomen: Soft, nontender, no hepatosplenomegaly, no distention.  Skin: Intact without lesions or rashes.  Neurologic: Sedated Extremities: No clubbing or cyanosis.  HEENT: Normal.   Remains in NSR, continue amiodarone/heparin.   Suspect primarily septic shock with good co-ox but component of cardiogenic as well.   - Titrate NE as able.   - Continue vancomycin/meropenem.   CVP 12, weight up another 7 lbs, K up.  Still making urine but I>O.  BUN/creatinine rising.  Concerned we  are going in the wrong direction here and may end up needing CVVH.  - Continue Lasix gtt 12 for now.  - Will consult renal.  - Will give Lokelma.   CRITICAL CARE Performed by: Kirk Ruths  Taylor Spilde  Total critical care time: 40 minutes  Critical care time was exclusive of separately billable procedures and treating other patients.  Critical care was necessary to treat or prevent imminent or life-threatening deterioration.  Critical care was time spent personally by me on the following activities: development of treatment plan with patient and/or surrogate as well as nursing, discussions with consultants, evaluation of patient's response to treatment, examination of patient, obtaining history from patient or surrogate, ordering and performing treatments and interventions, ordering and review of laboratory studies, ordering and review of radiographic studies, pulse oximetry and re-evaluation of patient's condition.  [name] 05/25/2021 7:54 AM

## 2021-05-25 NOTE — Progress Notes (Signed)
OT Cancellation Note  Patient Details Name: SHERWIN HOLLINGSHED MRN: [id number] DOB: May 12, 1946   Cancelled Treatment:    Reason Eval/Treat Not Completed: Patient not medically ready Pt remains medically tenuous, sedated this AM and beginning CRRT. Discussed case with RN and OT will sign off at this time. Please reconsult if pt medically appropriate for therapy attempts.   Layla Maw 05/25/2021, 1:48 PM

## 2021-05-25 NOTE — Progress Notes (Signed)
Pharmacy Antibiotic Note  [name] is a 75 y.o. male admitted on 04/11/2021 with respiratory failure with hypoxia due to pneumonia.  Pt completed course of azithromycin/ceftriaxone (see below for dates). 6/7 CXR with worsening right opacity febrile and elevated wbc and he was placed on cefepime>meropenem and vancomycin on 6/7 - completed course 6/11.  Was restarted on meropenem and vancomycin on 6/14 given new fevers, worsening CXR, and increasing WBC. Since Scr has worsened to 2.65 and BUN increased to 138. Nephrology consulted and will start CRRT today. Received loading dose of 2g IV on 6/14 at 1209.  Plan: -Meropenem 1gm IV q8h once CRRT starts -Vancomycin 1500mg  IV q24h starts once CRRT starts -Will follow renal function, cultures and clinical progress   Height: 5\' 7"  (170.2 cm) Weight: 134.4 kg (296 lb 4.8 oz) IBW/kg (Calculated) : 66.1  Temp (24hrs), Avg:99.5 F (37.5 C), Min:97.7 F (36.5 C), Max:100.4 F (38 C)  Recent Labs  Lab 05/20/21 0402 05/20/21 2140 05/21/21 0500 05/21/21 2241 05/22/21 0425 05/23/21 0408 05/23/21 1600 05/24/21 0433 05/24/21 1348 05/24/21 2124 05/25/21 0431  WBC 22.2*  --  18.8*  --  14.5* 13.3*  --  14.7*  --   --  16.3*  CREATININE 1.83*  --  1.79*   < > 1.61* 1.50* 1.83* 1.85* 2.08* 2.48* 2.65*  VANCOTROUGH  --  15  --   --   --   --   --   --   --   --   --   VANCOPEAK 30  --   --   --   --   --   --   --   --   --   --    < > = values in this interval not displayed.     Estimated Creatinine Clearance: 31.3 mL/min (A) (by C-G formula based on SCr of 2.65 mg/dL (H)).    Allergies  Allergen Reactions   Tussionex Pennkinetic Er [Hydrocod Polst-Cpm Polst Er] Other (See Comments)    UNSPECIFIED REACTION  > "caused prostate problems"   Ace Inhibitors Cough   Cefepime Rash    Antimicrobials this admission: Azithromycin 5/30-6/3 Ceftriaxone 5/30-6/5 Vancomycin 6/7 >> 6/11 Cefepime 6/7 >> 6/9 Meropenem 6/9>>6/11  Microbiology  results: 5/30 BCx X 2:  NG/final 5/30 Trach aspirate: normal respiratory flora 5/30 Urine cx: NG/final 5/30 MRSA PCR: negative 5/29 COVID, flu A, flu B: negative 6/7 Trach aspirate: normal flora 6/9 BCx x2 ngF 6/14 Bcx x2: ngtd  6/14 Resp cx: no orgs seen  Antonietta Jewel, PharmD, [place] Pharmacist  Phone: [phone] 05/25/2021 11:57 AM  Please check AMION for all [place] phone numbers After 10:00 PM, call [place] [phone]

## 2021-05-25 NOTE — Progress Notes (Signed)
PHARMACY - PHYSICIAN COMMUNICATION CRITICAL VALUE ALERT - BLOOD CULTURE IDENTIFICATION (BCID)  DASEAN BROW is an 75 y.o. male who presented to [facility] on 04/24/2021 with a chief complaint of respiratory distress.   Assessment:  75 year old male admitted with respiratory distress from the end of May. He had a worsening chest x-ray on 6/14 with concern for fever on 6/13 so antibiotics were re-started and cultures were sent. Now with 1/4 blood cultures positive for MRSE (Noted that [employer] is marked as pending for now and microbiology is working to fix this issue.) This likely represents contamination.   Name of physician (or Provider) Contacted: Heart Failure pharmacist/ 2H pharmacist   Current antibiotics: Vancomycin/Meropenem   Changes to prescribed antibiotics recommended:  None based on this culture  Results for orders placed or performed during the hospital encounter of 04/28/2021  Blood Culture ID Panel (Reflexed) (Collected: 05/24/2021  8:49 AM)  Result Value Ref Range   Enterococcus faecalis NOT DETECTED NOT DETECTED   Enterococcus Faecium NOT DETECTED NOT DETECTED   Listeria monocytogenes NOT DETECTED NOT DETECTED   Staphylococcus species PENDING NOT DETECTED   Staphylococcus aureus (BCID) NOT DETECTED NOT DETECTED   Staphylococcus epidermidis PENDING NOT DETECTED   Staphylococcus lugdunensis PENDING NOT DETECTED   Streptococcus species PENDING NOT DETECTED   Streptococcus agalactiae NOT DETECTED NOT DETECTED   Streptococcus pneumoniae NOT DETECTED NOT DETECTED   Streptococcus pyogenes NOT DETECTED NOT DETECTED   A.calcoaceticus-baumannii NOT DETECTED NOT DETECTED   Bacteroides fragilis NOT DETECTED NOT DETECTED   Enterobacterales PENDING NOT DETECTED   Enterobacter cloacae complex NOT DETECTED NOT DETECTED   Escherichia coli NOT DETECTED NOT DETECTED   Klebsiella aerogenes NOT DETECTED NOT DETECTED   Klebsiella oxytoca NOT DETECTED NOT DETECTED   Klebsiella  pneumoniae NOT DETECTED NOT DETECTED   Proteus species NOT DETECTED NOT DETECTED   Salmonella species NOT DETECTED NOT DETECTED   Serratia marcescens NOT DETECTED NOT DETECTED   Haemophilus influenzae NOT DETECTED NOT DETECTED   Neisseria meningitidis NOT DETECTED NOT DETECTED   Pseudomonas aeruginosa NOT DETECTED NOT DETECTED   Stenotrophomonas maltophilia NOT DETECTED NOT DETECTED   Candida albicans NOT DETECTED NOT DETECTED   Candida auris NOT DETECTED NOT DETECTED   Candida glabrata NOT DETECTED NOT DETECTED   Candida krusei NOT DETECTED NOT DETECTED   Candida parapsilosis NOT DETECTED NOT DETECTED   Candida tropicalis NOT DETECTED NOT DETECTED   Cryptococcus neoformans/gattii NOT DETECTED NOT DETECTED   CTX-M ESBL PENDING NOT DETECTED   Carbapenem resistance IMP PENDING NOT DETECTED   Carbapenem resistance KPC PENDING NOT DETECTED   Methicillin resistance mecA/C DETECTED (A) NOT DETECTED   Carbapenem resistance NDM PENDING NOT DETECTED   Carbapenem resist OXA 48 LIKE PENDING NOT DETECTED   Carbapenem resistance VIM PENDING NOT DETECTED    Jimmy Footman, PharmD, BCPS, BCIDP Infectious Diseases Clinical Pharmacist Phone: [phone] 05/25/2021  1:23 PM

## 2021-05-25 NOTE — Procedures (Signed)
Central Venous Catheter Insertion Procedure Note  [name]  [id number]  Sep 22, 1946  Date:05/25/21  Time:12:40 PM   Provider Performing:Loc Feinstein D Rollene Rotunda   Procedure: Insertion of Non-tunneled Central Venous Catheter(36556)with US guidance (39688)    Indication(s) Hemodialysis  Consent Risks of the procedure as well as the alternatives and risks of each were explained to the patient and/or caregiver.  Consent for the procedure was obtained and is signed in the bedside chart  Anesthesia Topical only with 1% lidocaine   Timeout Verified patient identification, verified procedure, site/side was marked, verified correct patient position, special equipment/implants available, medications/allergies/relevant history reviewed, required imaging and test results available.  Sterile Technique Maximal sterile technique including full sterile barrier drape, hand hygiene, sterile gown, sterile gloves, mask, hair covering, sterile ultrasound probe cover (if used).  Procedure Description Area of catheter insertion was cleaned with chlorhexidine and draped in sterile fashion.   With real-time ultrasound guidance a HD catheter was placed into the right internal jugular vein.  Nonpulsatile blood flow and easy flushing noted in all ports.  The catheter was sutured in place and sterile dressing applied.     Complications/Tolerance None; patient tolerated the procedure well. Chest X-ray is ordered to verify placement for internal jugular or subclavian cannulation.  Chest x-ray is not ordered for femoral cannulation.  EBL Minimal  Specimen(s) None  JD Rexene Agent [facility] 05/25/2021, 12:42 PM  Please see [email or web address] for pager details.  From 7A-7P if no response, please call [phone]. After hours, please call ELink [phone].

## 2021-05-25 NOTE — Progress Notes (Signed)
ANTICOAGULATION CONSULT NOTE  Pharmacy Consult for  heparin  Indication: afib  Labs: Recent Labs    05/23/21 0408 05/23/21 1600 05/24/21 0433 05/24/21 1348 05/24/21 2124 05/25/21 0431  HGB 10.3*  --  9.7*  --   --  9.9*  HCT 33.7*  --  32.8*  --   --  33.0*  PLT 161  --  193  --   --  187  HEPARINUNFRC 0.29*  --  0.34  --   --  0.32  CREATININE 1.50*   < > 1.85* 2.08* 2.48* 2.65*   < > = values in this interval not displayed.    Assessment: 75 yo male with h/o Afib, Xarelto on hold, new thrombocytopenia on heparin and concern for HIT so was switched to argatroban. Heparin antibody was negative, so switched back to heparin on 05/14/21.  Heparin level came back slightly therapeutic at 0.32, on 1600 units/hr. Hgb 9.9, plt 187. No s/sx of bleeding or infusion issues.    Goal of Therapy:  Heparin level 0.3-0.7 units/ml Monitor platelets by anticoagulation protocol: Yes   Plan:  Continue IV heparin at 1600 units/hr. Daily heparin level and CBC F/u restart of DOAC when able  Thanks for allowing pharmacy to be a part of this patient's care.  Antonietta Jewel, PharmD, [place] Clinical Pharmacist  Phone: [phone] 05/25/2021 7:21 AM  Please check AMION for all [place] phone numbers After 10:00 PM, call [place] [phone]   ADDENDUM Heparin held from 1021 to 1257 for CRRT line placement - resumed at previous therapeutic rate, will get level with AM labs.   Antonietta Jewel, PharmD, [place] Clinical Pharmacist

## 2021-05-25 NOTE — Plan of Care (Signed)
  Problem: Education: Goal: Knowledge of General Education information will improve Description: Including pain rating scale, medication(s)/side effects and non-pharmacologic comfort measures Outcome: Progressing   Problem: Clinical Measurements: Goal: Ability to maintain clinical measurements within normal limits will improve Outcome: Progressing Goal: Will remain free from infection Outcome: Progressing Goal: Diagnostic test results will improve Outcome: Progressing Goal: Respiratory complications will improve Outcome: Progressing Goal: Cardiovascular complication will be avoided Outcome: Progressing   Problem: Activity: Goal: Risk for activity intolerance will decrease Outcome: Progressing   Problem: Nutrition: Goal: Adequate nutrition will be maintained Outcome: Progressing   Problem: Coping: Goal: Level of anxiety will decrease Outcome: Progressing   Problem: Elimination: Goal: Will not experience complications related to bowel motility Outcome: Progressing Goal: Will not experience complications related to urinary retention Outcome: Progressing   Problem: Pain Managment: Goal: General experience of comfort will improve Outcome: Progressing   Problem: Safety: Goal: Ability to remain free from injury will improve Outcome: Progressing   Problem: Skin Integrity: Goal: Risk for impaired skin integrity will decrease Outcome: Progressing   Problem: Activity: Goal: Ability to tolerate increased activity will improve Outcome: Progressing   Problem: Respiratory: Goal: Ability to maintain a clear airway and adequate ventilation will improve Outcome: Progressing   Problem: Role Relationship: Goal: Method of communication will improve Outcome: Progressing

## 2021-05-25 NOTE — Consult Note (Signed)
[name]  HISTORY AND PHYSICAL  [name] is an 75 y.o. male.    Chief Complaint: SOB/ respiratory distress  HPI: Pt is a 8 M with HTN, HLD, DM II, Afib, chronic systolic CHF, ICD in plac, obesity who is now seen in consultation at the request of [name] for eval and recs re: progressive AKI and volume overload.    Pt was admitted 04/10/2021 with SOB and respiratory distress, required intubation upon arrival to ED.  Volume overloaded, is being diuresed aggressively.  On pressors for shock, NE which is increasing.  TTE with EF down to 20-25%. Started antbiotics yesterday with vanc/ meropenem for pneumonia.  Also underwent DCCV for Afib/ flutter with RVR on 05/23/21.    Cr has been rising and is now up to 2.65 today with BUN 138 and K of 5.4.  Making 2 L UOP on aggressive diuresis, weight is steadily increasing and is up > 15 kg since 05/18/21.  In this setting we are asked to see.    Pt is unable to give any history as he is intubated/ sedated.  Discussion/ history augmented by his dtr who is in the room.    PMH: Past Medical History:  Diagnosis Date   AAA (abdominal aortic aneurysm) ([place])    followed by Dr. Bridgett Larsson   Adenomatous colon polyp 01/2004   Anemia    hx   Automatic implantable cardioverter-defibrillator in situ    AVM (arteriovenous malformation)    BPH (benign prostatic hypertrophy)    CAD (coronary artery disease)    s/p CABGx2 in 1996   Carotid artery stenosis    LCEA - Dr. Bridgett Larsson in 2013   CHF (congestive heart failure) (HCC)    Complication of anesthesia    claustrophobic, unabe to lie on back more than 4 hours at time due to back   COPD (chronic obstructive pulmonary disease) ([place])    Diabetes mellitus    DIET CONTROLLED- pt states that this was a misdiagnosis, he was treated while in the hospital  but returned home, loss a massive amount of weight and he has not had a problem with his blood sugar since. States everything has been normal for about 3  years.   Diverticulosis    Dyspnea    Dysrhythmia    ICD-defibrillator   Fatigue    GERD (gastroesophageal reflux disease)    H/O hiatal hernia    History of radiation therapy 04/09/17-04/17/17   SBRT right lung 54 Gy in 3 fractions   HLD (hyperlipidemia)    Hypertension    Hypothyroidism    Ischemic cardiomyopathy    Myocardial infarction ([place])    NSCL ca 2018   recurrence x 3   OSA on CPAP    AHI durign total sleep 14.69/hr, during REM 50.91/hr   Peripheral vascular disease ([place])    LCEA, L renal artery stent, bilat iliac stents, R SFA stenosis   Tremor    Ventricular tachycardia ([place]) 09/09/2014   Amiodarone was started after appropriate defibrillator shocks for ventricular tachycardia in October 2008   PSH: Past Surgical History:  Procedure Laterality Date   ABDOMINAL AORTIC ENDOVASCULAR STENT GRAFT [name] 01/06/2014   Procedure: ABDOMINAL AORTIC ENDOVASCULAR STENT GRAFT- GORE; ULTRASOUND GUIDED;  Surgeon: Conrad , MD;  Location: [facility];  Service: Vascular;  Laterality: [name];   ANGIOPLASTY     BILATERAL  LE  W/STENTS   BI-VENTRICULAR IMPLANTABLE CARDIOVERTER DEFIBRILLATOR UPGRADE [name] 10/09/2014   Procedure: BI-VENTRICULAR IMPLANTABLE CARDIOVERTER  DEFIBRILLATOR UPGRADE;  Surgeon: Evans Lance, MD;  Location: [facility] CATH LAB;  Service: Cardiovascular;  Laterality: [name];   BIV ICD GENERTAOR CHANGE OUT  10/09/2014   UPGRADE TO BIV        BY DR Lovena Le   CARDIAC CATHETERIZATION  09/16/2007   occlusion of both vein grafts, no significant LAD disease or diagonal disease, Cfx collaterals from the left, 70% in-stent restenosis of L renal artery, normal L main, RCA occluded ostially (Dr. Adora Fridge)   CARDIAC CATHETERIZATION  10/03/2002   SVG sequentially to OM1 & OM2 totally occluded at ostium, SVG to PDA totally occluded within previously placed prox vein graft stent, smal distal AAA, bialt iliac stents with 30% left end-stent restenosis and 50% right end-stent restnosis(Dr. Gerrie Nordmann)    CARDIAC CATHETERIZATION  06/11/1998   L main with 20% narrowing in distal 1/3; LAD with 1st diagonla having 70% ostial narrowing, 2nd diagonal with 40% narrowing in prox third, LIMA & RIMA widely patent; in-stent restenosis of RCIA with successful PTA and new prox SVTRCA stent for residual disease (Dr. Marella Chimes)   [place]  06/2005   Guidant Vitality HE - ischemic cardiomyopathy - Dr. Marella Chimes   CARDIOVERSION [name] 11/19/2020   Procedure: CARDIOVERSION;  Surgeon: Larey Dresser, MD;  [facility];  Service: Cardiovascular;  Laterality: [name];   CAROTID ENDARTERECTOMY Left 01/04/12   CORONARY ANGIOPLASTY  01/08/2004   cutting balloon atherectomy & percutaneous intervention of RCIA in-stent restenosis (Dr. Marella Chimes)   [place] GRAFT  11/04/1985   x2 - PDA and sequential DX-OM (Dr. Redmond Pulling)   ENDARTERECTOMY  01/04/2012   Procedure: ENDARTERECTOMY CAROTID;left  Surgeon: Hinda Lenis, MD;  Location: [facility];  Service: Vascular;  Laterality: Left;  with patch angioplasty   FUDUCIAL PLACEMENT [name] 02/23/2017   Procedure: PLACEMENT OF FUDUCIAL;  Surgeon: [name], MD;  Location: [facility];  Service: Thoracic;  Laterality: [name];   ICD GENERATOR CHANGE  05/02/2010   [employer] Teligen   ILIAC ARTERY STENT Bilateral 03/1997   and L SFA PTA   Iron infusion  June 16, 2012   LEFT HEART CATHETERIZATION WITH CORONARY ANGIOGRAM [name] 07/06/2014   Procedure: LEFT HEART CATHETERIZATION WITH CORONARY ANGIOGRAM;  Surgeon: Peter M Martinique, MD;  Location: [facility] CATH LAB;  Service: Cardiovascular;  Laterality: [name];   NM MYOCAR PERF WALL MOTION  09/2012   lexiscan myoview - mod-severe perfusion defect r/t infarct or scar w/mild periinfarct ishcemia in basal inferior, mid inferior, apical inferior, basal inferolateral & mid inferoalteral regions - EF 21% low risk scan   POLYPECTOMY     RENAL ARTERY STENT Left 03/24/2004   6x61m Genesis stent (Dr. RMarella Chimes    RIGHT/LEFT HEART CATH AND CORONARY ANGIOGRAPHY [name] 12/28/2017   Procedure: RIGHT/LEFT HEART CATH AND CORONARY ANGIOGRAPHY;  Surgeon: MLarey Dresser MD;  [facility];  Service: Cardiovascular;  Laterality: [name];   TRANSTHORACIC ECHOCARDIOGRAM  12/2012   EF 30-35; LV mod to severely dilated, mod concentric hypertrophy, severe hypokinesis of inferolateral myocardium, moderate hypokineis of anteroseptal region, grade 1 diastolic dysfunction; mod MR; LA mod-severely dialted; RV mod dialted; RA mildly dilated; PA peak pressure 348mg   VIDEO BRONCHOSCOPY WITH ENDOBRONCHIAL NAVIGATION [name] 02/23/2017   Procedure: VIDEO BRONCHOSCOPY WITH ENDOBRONCHIAL NAVIGATION;  Surgeon: StMelrose NakayamaMD;  [place] [facility] Service: Thoracic;  Laterality: [name];    Past Medical History:  Diagnosis Date   AAA (abdominal aortic aneurysm) (  [place])    followed by Dr. Bridgett Larsson   Adenomatous colon polyp 01/2004   Anemia    hx   Automatic implantable cardioverter-defibrillator in situ    AVM (arteriovenous malformation)    BPH (benign prostatic hypertrophy)    CAD (coronary artery disease)    s/p CABGx2 in 1996   Carotid artery stenosis    LCEA - Dr. Bridgett Larsson in 2013   CHF (congestive heart failure) (HCC)    Complication of anesthesia    claustrophobic, unabe to lie on back more than 4 hours at time due to back   COPD (chronic obstructive pulmonary disease) ([place])    Diabetes mellitus    DIET CONTROLLED- pt states that this was a misdiagnosis, he was treated while in the hospital  but returned home, loss a massive amount of weight and he has not had a problem with his blood sugar since. States everything has been normal for about 3 years.   Diverticulosis    Dyspnea    Dysrhythmia    ICD-defibrillator   Fatigue    GERD (gastroesophageal reflux disease)    H/O hiatal hernia    History of radiation therapy 04/09/17-04/17/17   SBRT right lung 54 Gy in 3 fractions   HLD (hyperlipidemia)    Hypertension     Hypothyroidism    Ischemic cardiomyopathy    Myocardial infarction ([place])    NSCL ca 2018   recurrence x 3   OSA on CPAP    AHI durign total sleep 14.69/hr, during REM 50.91/hr   Peripheral vascular disease (HCC)    LCEA, L renal artery stent, bilat iliac stents, R SFA stenosis   Tremor    Ventricular tachycardia (HCC) 09/09/2014   Amiodarone was started after appropriate defibrillator shocks for ventricular tachycardia in October 2008    Medications:  Scheduled:  arformoterol  15 mcg Nebulization BID   aspirin  81 mg Per Tube Daily   atorvastatin  80 mg Per Tube Daily   bethanechol  10 mg Per Tube TID   budesonide (PULMICORT) nebulizer solution  0.5 mg Nebulization BID   chlorhexidine gluconate (MEDLINE KIT)  15 mL Mouth Rinse BID   Chlorhexidine Gluconate Cloth  6 each Topical Q0600   clonazePAM  1 mg Per Tube BID   docusate  100 mg Per Tube BID   feeding supplement (PROSource TF)  90 mL Per Tube TID   Gerhardt's butt cream   Topical QID   levothyroxine  50 mcg Per Tube Q0600   mouth rinse  15 mL Mouth Rinse 10 times per day   pantoprazole sodium  40 mg Per Tube Daily   polyethylene glycol  17 g Per Tube Daily   revefenacin  175 mcg Nebulization Daily   senna  1 tablet Per Tube Daily   sodium bicarbonate  650 mg Oral BID   sodium chloride flush  10-40 mL Intracatheter Q12H   vancomycin variable dose per unstable renal function (pharmacist dosing)   Does not apply See admin instructions    Medications Prior to Admission  Medication Sig Dispense Refill   albuterol (PROVENTIL HFA;VENTOLIN HFA) 108 (90 BASE) MCG/ACT inhaler Inhale 2 puffs into the lungs every 6 (six) hours as needed for wheezing or shortness of breath. 3 Inhaler 4   albuterol (PROVENTIL) (2.5 MG/3ML) 0.083% nebulizer solution USE 1 VIAL IN NEBULIZER 4 TIMES DAILY (Patient taking differently: Take 2.5 mg by nebulization 4 (four) times daily.) 120 mL 11   amiodarone (PACERONE) 200 MG tablet  Take 1 tablet (200  mg total) by mouth daily. 45 tablet 3   atorvastatin (LIPITOR) 80 MG tablet TAKE 1 TABLET BY MOUTH EVERY DAY (Patient taking differently: Take 80 mg by mouth daily.) 30 tablet 1   ATROVENT HFA 17 MCG/ACT inhaler Inhale 2 puffs into the lungs every 4 (four) hours as needed for wheezing.   12   carvedilol (COREG) 12.5 MG tablet Take 1 tablet (12.5 mg total) by mouth 2 (two) times daily with a meal. 60 tablet 11   dapagliflozin propanediol (FARXIGA) 10 MG TABS tablet Take 1 tablet (10 mg total) by mouth daily before breakfast. 30 tablet 11   diazepam (VALIUM) 5 MG tablet Take 1 tablet (5 mg total) by mouth every 8 (eight) hours as needed for anxiety. 1-2 tabs every 8 hours if needed for tremor 30 tablet 5   ezetimibe (ZETIA) 10 MG tablet TAKE 1 TABLET BY MOUTH  DAILY (Patient taking differently: Take 10 mg by mouth daily.) 90 tablet 3   finasteride (PROSCAR) 5 MG tablet TAKE 1 TABLET BY MOUTH  DAILY (Patient taking differently: Take 5 mg by mouth daily.) 90 tablet 3   fluticasone (FLONASE) 50 MCG/ACT nasal spray Instill 1 spray in each  nostril daily (Patient taking differently: Place 1 spray into both nostrils daily as needed for allergies.) 32 g 3   Fluticasone-Umeclidin-Vilant (TRELEGY ELLIPTA) 100-62.5-25 MCG/INH AEPB Inhale 1 puff into the lungs daily. Rinse mouth 60 each 12   icosapent Ethyl (VASCEPA) 1 g capsule TAKE 2 CAPSULES (2 G TOTAL) BY MOUTH 2 (TWO) TIMES A DAY. (Patient taking differently: Take 2 g by mouth 2 (two) times daily.) 120 capsule 11   isosorbide mononitrate (IMDUR) 60 MG 24 hr tablet TAKE 1.5 TABLETS (90 MG TOTAL) BY MOUTH DAILY. (Patient taking differently: Take 90 mg by mouth daily.) 90 tablet 1   levothyroxine (SYNTHROID) 50 MCG tablet TAKE 1 TABLET BY MOUTH  DAILY BEFORE BREAKFAST (Patient taking differently: Take 50 mcg by mouth daily before breakfast.) 90 tablet 1   Multiple Vitamins-Minerals (MULTIVITAMIN WITH MINERALS) tablet Take 1 tablet by mouth daily.     Multiple  Vitamins-Minerals (PRESERVISION AREDS 2 PO) Take 1 capsule by mouth at bedtime.     nitroGLYCERIN (NITROSTAT) 0.4 MG SL tablet PLACE 1 TABLET UNDER THE TONGUE EVERY 5 MINUTES AS NEEDED FOR CHEST PAIN. (Patient taking differently: Place 0.4 mg under the tongue every 5 (five) minutes as needed for chest pain.) 25 tablet 3   NON FORMULARY See admin instructions. CPAP At bedtime And during the day as needed when napping     OXYGEN Inhale 2.5-3 L into the lungs continuous.     potassium chloride SA (KLOR-CON M20) 20 MEQ tablet Take 1.5 tablets (30 mEq total) by mouth daily. 135 tablet 3   Rivaroxaban (XARELTO) 15 MG TABS tablet Take 1 tablet (15 mg total) by mouth daily with supper. 30 tablet 11   sacubitril-valsartan (ENTRESTO) 49-51 MG Take 1 tablet by mouth 2 (two) times daily.     sacubitril-valsartan (ENTRESTO) 97-103 MG Take 1 tablet by mouth 2 (two) times daily. 180 tablet 3   spironolactone (ALDACTONE) 25 MG tablet TAKE 1 TABLET BY MOUTH  DAILY (Patient taking differently: Take 25 mg by mouth daily.) 90 tablet 3   torsemide (DEMADEX) 20 MG tablet Take 3 tablets (60 mg total) by mouth in the morning AND 2 tablets (40 mg total) every evening. 150 tablet 11   traMADol (ULTRAM) 50 MG tablet Take 1  tablet (50 mg total) by mouth every 6 (six) hours as needed. (Patient taking differently: Take 50 mg by mouth every 6 (six) hours as needed for moderate pain.) 75 tablet 3    ALLERGIES:   Allergies  Allergen Reactions   Tussionex Pennkinetic Er [Hydrocod Polst-Cpm Polst Er] Other (See Comments)    UNSPECIFIED REACTION  > "caused prostate problems"   Ace Inhibitors Cough   Cefepime Rash    FAM HX: Family History  Problem Relation Age of Onset   Lung cancer Mother    Cancer Mother    Diabetes Mother    Hypertension Mother    Hyperlipidemia Mother    Heart disease Mother        before age 42   Heart attack Father    Heart disease Father        before age 41   Hypertension Father    Colon  cancer Sister    Cancer Sister    Hypertension Sister    Hyperlipidemia Sister    Parkinson's disease Sister    Diabetes Sister    Heart disease Sister        before age 78   Heart attack Maternal Grandmother    Diabetes Daughter     Social History:   reports that he quit smoking about 18 years ago. His smoking use included cigarettes and pipe. He has a 60.00 pack-year smoking history. He has never used smokeless tobacco. He reports that he does not drink alcohol and does not use drugs.  ROS: ROS: unable to obtain- intubated and sedated  Blood pressure (!) 90/30, pulse 70, temperature 98.78 F (37.1 C), resp. rate (!) 25, height 5' 7"  (1.702 m), weight 134.4 kg, SpO2 99 %. PHYSICAL EXAM: Physical Exam GEN: NAD,lying in bed HEENT eyes closed NECK + JVD PULM coarse rhonchi throughout anteriorly CV RRR ABD soft, mildly distended with abd wall edema EXT 2+ anasarca with areas of weeping NEURO intubated and sedated    Results for orders placed or performed during the hospital encounter of 04/28/2021 (from the past 48 hour(s))  Glucose, capillary     Status: Abnormal   Collection Time: 05/23/21 10:50 AM  Result Value Ref Range   Glucose-Capillary 138 (H) 70 - 99 mg/dL    Comment: Glucose reference range applies only to samples taken after fasting for at least 8 hours.  Glucose, capillary     Status: Abnormal   Collection Time: 05/23/21 12:20 PM  Result Value Ref Range   Glucose-Capillary 182 (H) 70 - 99 mg/dL    Comment: Glucose reference range applies only to samples taken after fasting for at least 8 hours.  Glucose, capillary     Status: Abnormal   Collection Time: 05/23/21  1:26 PM  Result Value Ref Range   Glucose-Capillary 210 (H) 70 - 99 mg/dL    Comment: Glucose reference range applies only to samples taken after fasting for at least 8 hours.  Glucose, capillary     Status: Abnormal   Collection Time: 05/23/21  2:50 PM  Result Value Ref Range   Glucose-Capillary 228  (H) 70 - 99 mg/dL    Comment: Glucose reference range applies only to samples taken after fasting for at least 8 hours.  Glucose, capillary     Status: Abnormal   Collection Time: 05/23/21  3:55 PM  Result Value Ref Range   Glucose-Capillary 237 (H) 70 - 99 mg/dL    Comment: Glucose reference range applies only to samples taken  after fasting for at least 8 hours.  Basic metabolic panel     Status: Abnormal   Collection Time: 05/23/21  4:00 PM  Result Value Ref Range   Sodium 143 135 - 145 mmol/L   Potassium 5.5 (H) 3.5 - 5.1 mmol/L   Chloride 109 98 - 111 mmol/L   CO2 20 (L) 22 - 32 mmol/L   Glucose, Bld 259 (H) 70 - 99 mg/dL    Comment: Glucose reference range applies only to samples taken after fasting for at least 8 hours.   BUN 96 (H) 8 - 23 mg/dL   Creatinine, Ser 1.83 (H) 0.61 - 1.24 mg/dL   Calcium 8.4 (L) 8.9 - 10.3 mg/dL   GFR, Estimated 38 (L) >60 mL/min    Comment: (NOTE) Calculated using the CKD-EPI Creatinine Equation (2021)    Anion gap 14 5 - 15    Comment: Performed at [facility] [address] [place], [place] [address]  Glucose, capillary     Status: Abnormal   Collection Time: 05/23/21  5:02 PM  Result Value Ref Range   Glucose-Capillary 218 (H) 70 - 99 mg/dL    Comment: Glucose reference range applies only to samples taken after fasting for at least 8 hours.  Glucose, capillary     Status: Abnormal   Collection Time: 05/23/21  6:02 PM  Result Value Ref Range   Glucose-Capillary 192 (H) 70 - 99 mg/dL    Comment: Glucose reference range applies only to samples taken after fasting for at least 8 hours.  Glucose, capillary     Status: Abnormal   Collection Time: 05/23/21  7:06 PM  Result Value Ref Range   Glucose-Capillary 204 (H) 70 - 99 mg/dL    Comment: Glucose reference range applies only to samples taken after fasting for at least 8 hours.  Glucose, capillary     Status: Abnormal   Collection Time: 05/23/21  8:12 PM  Result Value Ref Range    Glucose-Capillary 229 (H) 70 - 99 mg/dL    Comment: Glucose reference range applies only to samples taken after fasting for at least 8 hours.  Glucose, capillary     Status: Abnormal   Collection Time: 05/23/21  9:29 PM  Result Value Ref Range   Glucose-Capillary 216 (H) 70 - 99 mg/dL    Comment: Glucose reference range applies only to samples taken after fasting for at least 8 hours.  Glucose, capillary     Status: Abnormal   Collection Time: 05/23/21 10:35 PM  Result Value Ref Range   Glucose-Capillary 112 (H) 70 - 99 mg/dL    Comment: Glucose reference range applies only to samples taken after fasting for at least 8 hours.  Glucose, capillary     Status: Abnormal   Collection Time: 05/23/21 11:31 PM  Result Value Ref Range   Glucose-Capillary 203 (H) 70 - 99 mg/dL    Comment: Glucose reference range applies only to samples taken after fasting for at least 8 hours.  Glucose, capillary     Status: Abnormal   Collection Time: 05/24/21 12:27 AM  Result Value Ref Range   Glucose-Capillary 231 (H) 70 - 99 mg/dL    Comment: Glucose reference range applies only to samples taken after fasting for at least 8 hours.  Glucose, capillary     Status: Abnormal   Collection Time: 05/24/21  1:27 AM  Result Value Ref Range   Glucose-Capillary 227 (H) 70 - 99 mg/dL    Comment:  Glucose reference range applies only to samples taken after fasting for at least 8 hours.  Glucose, capillary     Status: Abnormal   Collection Time: 05/24/21  2:45 AM  Result Value Ref Range   Glucose-Capillary 228 (H) 70 - 99 mg/dL    Comment: Glucose reference range applies only to samples taken after fasting for at least 8 hours.  Glucose, capillary     Status: Abnormal   Collection Time: 05/24/21  3:33 AM  Result Value Ref Range   Glucose-Capillary 221 (H) 70 - 99 mg/dL    Comment: Glucose reference range applies only to samples taken after fasting for at least 8 hours.  Glucose, capillary     Status: Abnormal    Collection Time: 05/24/21  4:31 AM  Result Value Ref Range   Glucose-Capillary 202 (H) 70 - 99 mg/dL    Comment: Glucose reference range applies only to samples taken after fasting for at least 8 hours.  .Cooxemetry Panel (carboxy, met, total hgb, O2 sat)     Status: Abnormal   Collection Time: 05/24/21  4:32 AM  Result Value Ref Range   Total hemoglobin 11.3 (L) 12.0 - 16.0 g/dL   O2 Saturation 80.5 %   Carboxyhemoglobin 0.7 0.5 - 1.5 %   Methemoglobin 1.1 0.0 - 1.5 %    Comment: Performed at [facility] [address], [place], [place] [address]  Heparin level (unfractionated)     Status: None   Collection Time: 05/24/21  4:33 AM  Result Value Ref Range   Heparin Unfractionated 0.34 0.30 - 0.70 IU/mL    Comment: (NOTE) The clinical reportable range upper limit is being lowered to >1.10 to align with the FDA approved guidance for the current laboratory assay.  If heparin results are below expected values, and patient dosage has  been confirmed, suggest follow up testing of antithrombin III levels. Performed at [facility], [place] [address] [place], [place] [address]   CBC     Status: Abnormal   Collection Time: 05/24/21  4:33 AM  Result Value Ref Range   WBC 14.7 (H) 4.0 - 10.5 K/uL   RBC 3.14 (L) 4.22 - 5.81 MIL/uL   Hemoglobin 9.7 (L) 13.0 - 17.0 g/dL   HCT 32.8 (L) 39.0 - 52.0 %   MCV 104.5 (H) 80.0 - 100.0 fL   MCH 30.9 26.0 - 34.0 pg   MCHC 29.6 (L) 30.0 - 36.0 g/dL   RDW 14.6 11.5 - 15.5 %   Platelets 193 150 - 400 K/uL   nRBC 0.0 0.0 - 0.2 %    Comment: Performed at [facility], [place] [address] [place], [place] [address]  Basic metabolic panel     Status: Abnormal   Collection Time: 05/24/21  4:33 AM  Result Value Ref Range   Sodium 135 135 - 145 mmol/L   Potassium 4.1 3.5 - 5.1 mmol/L    Comment: DELTA CHECK NOTED   Chloride 104 98 - 111 mmol/L   CO2 19 (L) 22 - 32 mmol/L   Glucose, Bld 398 (H) 70 - 99 mg/dL    Comment: Glucose reference  range applies only to samples taken after fasting for at least 8 hours.   BUN 103 (H) 8 - 23 mg/dL   Creatinine, Ser 1.85 (H) 0.61 - 1.24 mg/dL   Calcium 7.8 (L) 8.9 - 10.3 mg/dL   GFR, Estimated 37 (L) >60 mL/min    Comment: (NOTE) Calculated using  the CKD-EPI Creatinine Equation (2021)    Anion gap 12 5 - 15    Comment: Performed at [facility], [place] [address], [place], [place] [address]  Glucose, capillary     Status: Abnormal   Collection Time: 05/24/21  5:34 AM  Result Value Ref Range   Glucose-Capillary 190 (H) 70 - 99 mg/dL    Comment: Glucose reference range applies only to samples taken after fasting for at least 8 hours.  Glucose, capillary     Status: Abnormal   Collection Time: 05/24/21  6:40 AM  Result Value Ref Range   Glucose-Capillary 174 (H) 70 - 99 mg/dL    Comment: Glucose reference range applies only to samples taken after fasting for at least 8 hours.  Glucose, capillary     Status: Abnormal   Collection Time: 05/24/21  7:49 AM  Result Value Ref Range   Glucose-Capillary 183 (H) 70 - 99 mg/dL    Comment: Glucose reference range applies only to samples taken after fasting for at least 8 hours.  Culture, blood (routine x 2)     Status: None (Preliminary result)   Collection Time: 05/24/21  8:49 AM   Specimen: BLOOD  Result Value Ref Range   Specimen Description BLOOD LEFT ANTECUBITAL    Special Requests      BOTTLES DRAWN AEROBIC AND ANAEROBIC Blood Culture adequate volume   Culture      NO GROWTH < 24 HOURS Performed at [facility], [place] [address], [place], [place] [address]    Report Status PENDING   Glucose, capillary     Status: Abnormal   Collection Time: 05/24/21  8:58 AM  Result Value Ref Range   Glucose-Capillary 171 (H) 70 - 99 mg/dL    Comment: Glucose reference range applies only to samples taken after fasting for at least 8 hours.  Procalcitonin - Baseline     Status: None   Collection Time: 05/24/21  9:09 AM  Result Value Ref  Range   Procalcitonin 0.45 ng/mL    Comment:        Interpretation: PCT (Procalcitonin) <= 0.5 ng/mL: Systemic infection (sepsis) is not likely. Local bacterial infection is possible. (NOTE)       Sepsis PCT Algorithm           Lower Respiratory Tract                                      Infection PCT Algorithm    ----------------------------     ----------------------------         PCT < 0.25 ng/mL                PCT < 0.10 ng/mL          Strongly encourage             Strongly discourage   discontinuation of antibiotics    initiation of antibiotics    ----------------------------     -----------------------------       PCT 0.25 - 0.50 ng/mL            PCT 0.10 - 0.25 ng/mL               OR       >80% decrease in PCT            Discourage initiation of  antibiotics      Encourage discontinuation           of antibiotics    ----------------------------     -----------------------------         PCT >= 0.50 ng/mL              PCT 0.26 - 0.50 ng/mL               AND        <80% decrease in PCT             Encourage initiation of                                             antibiotics       Encourage continuation           of antibiotics    ----------------------------     -----------------------------        PCT >= 0.50 ng/mL                  PCT > 0.50 ng/mL               AND         increase in PCT                  Strongly encourage                                      initiation of antibiotics    Strongly encourage escalation           of antibiotics                                     -----------------------------                                           PCT <= 0.25 ng/mL                                                 OR                                        > 80% decrease in PCT                                      Discontinue / Do not initiate                                             antibiotics  Performed at [facility], [place] [address] [place], [place] [address]   Cortisol, Random     Status: None   Collection  Time: 05/24/21  9:09 AM  Result Value Ref Range   Cortisol, Plasma 19.0 ug/dL    Comment: (NOTE) AM    6.7 - 22.6 ug/dL PM   <10.0       ug/dL Performed at [facility] [address], [place], [place] [address]   MRSA Next Gen by PCR, Nasal     Status: None   Collection Time: 05/24/21  9:58 AM  Result Value Ref Range   MRSA by PCR Next Gen NOT DETECTED NOT DETECTED    Comment: (NOTE) The GeneXpert MRSA Assay (FDA approved for NASAL specimens only), is one component of a comprehensive MRSA colonization surveillance program. It is not intended to diagnose MRSA infection nor to guide or monitor treatment for MRSA infections. Test performance is not FDA approved in patients less than 42 years old. Performed at [facility], [place] [address] [place], [place] [address]   Glucose, capillary     Status: Abnormal   Collection Time: 05/24/21 10:03 AM  Result Value Ref Range   Glucose-Capillary 154 (H) 70 - 99 mg/dL    Comment: Glucose reference range applies only to samples taken after fasting for at least 8 hours.  Urinalysis, Routine w reflex microscopic     Status: Abnormal   Collection Time: 05/24/21 10:43 AM  Result Value Ref Range   Color, Urine YELLOW YELLOW   APPearance CLOUDY (A) CLEAR   Specific Gravity, Urine 1.016 1.005 - 1.030   pH 5.0 5.0 - 8.0   Glucose, UA NEGATIVE NEGATIVE mg/dL   Hgb urine dipstick LARGE (A) NEGATIVE   Bilirubin Urine NEGATIVE NEGATIVE   Ketones, ur NEGATIVE NEGATIVE mg/dL   Protein, ur NEGATIVE NEGATIVE mg/dL   Nitrite NEGATIVE NEGATIVE   Leukocytes,Ua LARGE (A) NEGATIVE   RBC / HPF >50 (H) 0 - 5 RBC/hpf   WBC, UA >50 (H) 0 - 5 WBC/hpf   Bacteria, UA NONE SEEN NONE SEEN   Squamous Epithelial / LPF 0-5 0 - 5   WBC Clumps PRESENT    Mucus PRESENT    Budding Yeast PRESENT    Hyphae Yeast PRESENT     Comment: Performed at [facility], [place] [address] [place], [place] [address]  Culture, Respiratory w Gram Stain     Status: None (Preliminary result)   Collection Time: 05/24/21 11:00 AM   Specimen: Tracheal Aspirate; Respiratory  Result Value Ref Range   Specimen Description TRACHEAL ASPIRATE    Special Requests NONE    Gram Stain      RARE WBC PRESENT, PREDOMINANTLY MONONUCLEAR NO ORGANISMS SEEN Performed at [facility], [place] [address] [place], [place] [address]    Culture PENDING    Report Status PENDING   Glucose, capillary     Status: Abnormal   Collection Time: 05/24/21 11:11 AM  Result Value Ref Range   Glucose-Capillary 145 (H) 70 - 99 mg/dL    Comment: Glucose reference range applies only to samples taken after fasting for at least 8 hours.  Glucose, capillary     Status: Abnormal   Collection Time: 05/24/21  1:44 PM  Result Value Ref Range   Glucose-Capillary 188 (H) 70 - 99 mg/dL    Comment: Glucose reference range applies only to samples taken after fasting for at least 8 hours.  Basic metabolic panel     Status: Abnormal   Collection Time: 05/24/21  1:48 PM  Result Value Ref Range   Sodium 139  135 - 145 mmol/L   Potassium 5.1 3.5 - 5.1 mmol/L    Comment: NO VISIBLE HEMOLYSIS   Chloride 110 98 - 111 mmol/L   CO2 18 (L) 22 - 32 mmol/L   Glucose, Bld 198 (H) 70 - 99 mg/dL    Comment: Glucose reference range applies only to samples taken after fasting for at least 8 hours.   BUN 117 (H) 8 - 23 mg/dL   Creatinine, Ser 2.08 (H) 0.61 - 1.24 mg/dL   Calcium 8.3 (L) 8.9 - 10.3 mg/dL   GFR, Estimated 32 (L) >60 mL/min    Comment: (NOTE) Calculated using the CKD-EPI Creatinine Equation (2021)    Anion gap 11 5 - 15    Comment: Performed at [facility] [address], [place], [place] [address]  Glucose, capillary     Status: Abnormal   Collection Time: 05/24/21  2:50 PM  Result Value Ref Range   Glucose-Capillary 191 (H) 70 - 99 mg/dL    Comment: Glucose reference range  applies only to samples taken after fasting for at least 8 hours.  Glucose, capillary     Status: Abnormal   Collection Time: 05/24/21  3:53 PM  Result Value Ref Range   Glucose-Capillary 177 (H) 70 - 99 mg/dL    Comment: Glucose reference range applies only to samples taken after fasting for at least 8 hours.  Glucose, capillary     Status: Abnormal   Collection Time: 05/24/21  5:00 PM  Result Value Ref Range   Glucose-Capillary 172 (H) 70 - 99 mg/dL    Comment: Glucose reference range applies only to samples taken after fasting for at least 8 hours.  Glucose, capillary     Status: Abnormal   Collection Time: 05/24/21  6:03 PM  Result Value Ref Range   Glucose-Capillary 187 (H) 70 - 99 mg/dL    Comment: Glucose reference range applies only to samples taken after fasting for at least 8 hours.  Glucose, capillary     Status: Abnormal   Collection Time: 05/24/21  7:23 PM  Result Value Ref Range   Glucose-Capillary 173 (H) 70 - 99 mg/dL    Comment: Glucose reference range applies only to samples taken after fasting for at least 8 hours.  Glucose, capillary     Status: Abnormal   Collection Time: 05/24/21  9:01 PM  Result Value Ref Range   Glucose-Capillary 162 (H) 70 - 99 mg/dL    Comment: Glucose reference range applies only to samples taken after fasting for at least 8 hours.  Basic metabolic panel     Status: Abnormal   Collection Time: 05/24/21  9:24 PM  Result Value Ref Range   Sodium 140 135 - 145 mmol/L   Potassium 5.3 (H) 3.5 - 5.1 mmol/L   Chloride 110 98 - 111 mmol/L   CO2 17 (L) 22 - 32 mmol/L   Glucose, Bld 177 (H) 70 - 99 mg/dL    Comment: Glucose reference range applies only to samples taken after fasting for at least 8 hours.   BUN 130 (H) 8 - 23 mg/dL   Creatinine, Ser 2.48 (H) 0.61 - 1.24 mg/dL   Calcium 8.3 (L) 8.9 - 10.3 mg/dL   GFR, Estimated 26 (L) >60 mL/min    Comment: (NOTE) Calculated using the CKD-EPI Creatinine Equation (2021)    Anion gap 13 5 - 15     Comment: Performed at [facility] [address] [place], [place]  [address]  Glucose, capillary     Status: Abnormal   Collection Time: 05/24/21 10:17 PM  Result Value Ref Range   Glucose-Capillary 175 (H) 70 - 99 mg/dL    Comment: Glucose reference range applies only to samples taken after fasting for at least 8 hours.  Glucose, capillary     Status: Abnormal   Collection Time: 05/25/21 12:08 AM  Result Value Ref Range   Glucose-Capillary 171 (H) 70 - 99 mg/dL    Comment: Glucose reference range applies only to samples taken after fasting for at least 8 hours.  Glucose, capillary     Status: Abnormal   Collection Time: 05/25/21  2:07 AM  Result Value Ref Range   Glucose-Capillary 178 (H) 70 - 99 mg/dL    Comment: Glucose reference range applies only to samples taken after fasting for at least 8 hours.  Heparin level (unfractionated)     Status: None   Collection Time: 05/25/21  4:31 AM  Result Value Ref Range   Heparin Unfractionated 0.32 0.30 - 0.70 IU/mL    Comment: (NOTE) The clinical reportable range upper limit is being lowered to >1.10 to align with the FDA approved guidance for the current laboratory assay.  If heparin results are below expected values, and patient dosage has  been confirmed, suggest follow up testing of antithrombin III levels. Performed at [facility], [place] [address], [place], [place] [address]   CBC     Status: Abnormal   Collection Time: 05/25/21  4:31 AM  Result Value Ref Range   WBC 16.3 (H) 4.0 - 10.5 K/uL   RBC 3.21 (L) 4.22 - 5.81 MIL/uL   Hemoglobin 9.9 (L) 13.0 - 17.0 g/dL   HCT 33.0 (L) 39.0 - 52.0 %   MCV 102.8 (H) 80.0 - 100.0 fL   MCH 30.8 26.0 - 34.0 pg   MCHC 30.0 30.0 - 36.0 g/dL   RDW 14.6 11.5 - 15.5 %   Platelets 187 150 - 400 K/uL   nRBC 0.0 0.0 - 0.2 %    Comment: Performed at [facility], [place] [address], [place], [place] [address]  .Cooxemetry Panel (carboxy, met, total hgb, O2 sat)     Status:  Abnormal   Collection Time: 05/25/21  4:31 AM  Result Value Ref Range   Total hemoglobin 10.1 (L) 12.0 - 16.0 g/dL   O2 Saturation 81.0 %   Carboxyhemoglobin 0.7 0.5 - 1.5 %   Methemoglobin 1.3 0.0 - 1.5 %    Comment: Performed at [facility], [place] [address], [place], [place] [address]  Basic metabolic panel     Status: Abnormal   Collection Time: 05/25/21  4:31 AM  Result Value Ref Range   Sodium 141 135 - 145 mmol/L   Potassium 5.4 (H) 3.5 - 5.1 mmol/L   Chloride 110 98 - 111 mmol/L   CO2 16 (L) 22 - 32 mmol/L   Glucose, Bld 192 (H) 70 - 99 mg/dL    Comment: Glucose reference range applies only to samples taken after fasting for at least 8 hours.   BUN 138 (H) 8 - 23 mg/dL   Creatinine, Ser 2.65 (H) 0.61 - 1.24 mg/dL   Calcium 8.3 (L) 8.9 - 10.3 mg/dL   GFR, Estimated 24 (L) >60 mL/min    Comment: (NOTE) Calculated using the CKD-EPI Creatinine Equation (2021)    Anion gap 15 5 - 15    Comment: Performed at [facility],  [address] [place], [place] [address]  Procalcitonin     Status: None   Collection Time: 05/25/21  4:31 AM  Result Value Ref Range   Procalcitonin 0.49 ng/mL    Comment:        Interpretation: PCT (Procalcitonin) <= 0.5 ng/mL: Systemic infection (sepsis) is not likely. Local bacterial infection is possible. (NOTE)       Sepsis PCT Algorithm           Lower Respiratory Tract                                      Infection PCT Algorithm    ----------------------------     ----------------------------         PCT < 0.25 ng/mL                PCT < 0.10 ng/mL          Strongly encourage             Strongly discourage   discontinuation of antibiotics    initiation of antibiotics    ----------------------------     -----------------------------       PCT 0.25 - 0.50 ng/mL            PCT 0.10 - 0.25 ng/mL               OR       >80% decrease in PCT            Discourage initiation of                                            antibiotics       Encourage discontinuation           of antibiotics    ----------------------------     -----------------------------         PCT >= 0.50 ng/mL              PCT 0.26 - 0.50 ng/mL               AND        <80% decrease in PCT             Encourage initiation of                                             antibiotics       Encourage continuation           of antibiotics    ----------------------------     -----------------------------        PCT >= 0.50 ng/mL                  PCT > 0.50 ng/mL               AND         increase in PCT                  Strongly encourage  initiation of antibiotics    Strongly encourage escalation           of antibiotics                                     -----------------------------                                           PCT <= 0.25 ng/mL                                                 OR                                        > 80% decrease in PCT                                      Discontinue / Do not initiate                                             antibiotics  Performed at [facility], [address], [place], [place] [address]   Magnesium     Status: Abnormal   Collection Time: 05/25/21  4:31 AM  Result Value Ref Range   Magnesium 2.8 (H) 1.7 - 2.4 mg/dL    Comment: Performed at [facility] [address], [place], [place] [address]  Glucose, capillary     Status: Abnormal   Collection Time: 05/25/21  4:39 AM  Result Value Ref Range   Glucose-Capillary 165 (H) 70 - 99 mg/dL    Comment: Glucose reference range applies only to samples taken after fasting for at least 8 hours.  Glucose, capillary     Status: Abnormal   Collection Time: 05/25/21  6:52 AM  Result Value Ref Range   Glucose-Capillary 153 (H) 70 - 99 mg/dL    Comment: Glucose reference range applies only to samples taken after fasting for at least 8 hours.  Glucose, capillary     Status: Abnormal   Collection Time:  05/25/21  9:16 AM  Result Value Ref Range   Glucose-Capillary 201 (H) 70 - 99 mg/dL    Comment: Glucose reference range applies only to samples taken after fasting for at least 8 hours.    DG CHEST PORT 1 VIEW  Result Date: 05/24/2021 CLINICAL DATA:  75 year old male with respiratory failure, hypoxia. Treated right lung cancer. EXAM: PORTABLE CHEST 1 VIEW COMPARISON:  Portable chest 05/22/2021 and earlier. FINDINGS: Portable AP semi upright view at 0755 hours. Stable left chest AICD. Stable cardiomegaly and mediastinal contours. Intubated. Endotracheal tube tip between the clavicles and carina. Enteric tube courses to the abdomen, tip not included. Stable right PICC line. Stable surgical clips and architectural distortion in the right upper lobe. Stable similar architectural distortion in the left upper lobe. No pneumothorax or pulmonary edema. No  definite pleural effusion. But increasing retrocardiac opacity partially obscuring the left hemidiaphragm now. IMPRESSION: 1.  Stable lines and tubes. 2. Increasing left lower lobe opacity since 05/22/2021 could be pneumonia or recurrent atelectasis. 3. Stable architectural distortion in both upper lobes. Electronically Signed   By: Genevie Ann M.D.   On: 05/24/2021 08:18    Assessment/Plan   AKI on CKD 3b: Cr baseline 1.5-1.6 and now up to 2.65 with massive volume overload and increasing azotemia to 138 and K 5.4.  Discussed situation with his daughter.  Recommend CRRT for correction of electrolyte and volume abnormalities.  Plan as follows: - will start CRRT today 05/25/21 for correction of the above -discussed with pt's dtr that time- limited trial is most appropriate--> we will trial 3-4 days and then reassess to see if CRRT and volume removal has allowed pt's mentation/ respiratory status to improve - all 4K bath, no heparin (on systemic gtt)  2.  Acute on chronic systolic CHF exac: - massively volume overloaded - on Lasix gtt for now, if tolerates  aggressive UF with CRRT can stop  3.  Shock: likely septic, ? PNA - per primary - vanc/ meropenem - on levophed  4.  Afib/ flutter: - s/p DCCV 05/23/21 - on hep gtt  5.  DM II: - on insulin gtt  6.  Hyperkalemia:  - will improve wth CRRT  7.  Dispo: in ICU  Madelon Lips 05/25/2021, 10:09 AM

## 2021-05-26 DIAGNOSIS — J90 Pleural effusion, not elsewhere classified: Secondary | ICD-10-CM

## 2021-05-26 LAB — GLUCOSE, CAPILLARY
Glucose-Capillary: 165 mg/dL — ABNORMAL HIGH (ref 70–99)
Glucose-Capillary: 165 mg/dL — ABNORMAL HIGH (ref 70–99)
Glucose-Capillary: 168 mg/dL — ABNORMAL HIGH (ref 70–99)
Glucose-Capillary: 171 mg/dL — ABNORMAL HIGH (ref 70–99)
Glucose-Capillary: 171 mg/dL — ABNORMAL HIGH (ref 70–99)
Glucose-Capillary: 174 mg/dL — ABNORMAL HIGH (ref 70–99)
Glucose-Capillary: 175 mg/dL — ABNORMAL HIGH (ref 70–99)
Glucose-Capillary: 176 mg/dL — ABNORMAL HIGH (ref 70–99)
Glucose-Capillary: 177 mg/dL — ABNORMAL HIGH (ref 70–99)
Glucose-Capillary: 180 mg/dL — ABNORMAL HIGH (ref 70–99)
Glucose-Capillary: 184 mg/dL — ABNORMAL HIGH (ref 70–99)
Glucose-Capillary: 187 mg/dL — ABNORMAL HIGH (ref 70–99)
Glucose-Capillary: 188 mg/dL — ABNORMAL HIGH (ref 70–99)
Glucose-Capillary: 189 mg/dL — ABNORMAL HIGH (ref 70–99)
Glucose-Capillary: 190 mg/dL — ABNORMAL HIGH (ref 70–99)
Glucose-Capillary: 195 mg/dL — ABNORMAL HIGH (ref 70–99)
Glucose-Capillary: 213 mg/dL — ABNORMAL HIGH (ref 70–99)
Glucose-Capillary: 217 mg/dL — ABNORMAL HIGH (ref 70–99)

## 2021-05-26 LAB — RENAL FUNCTION PANEL
Albumin: 2 g/dL — ABNORMAL LOW (ref 3.5–5.0)
Albumin: 2 g/dL — ABNORMAL LOW (ref 3.5–5.0)
Anion gap: 10 (ref 5–15)
Anion gap: 9 (ref 5–15)
BUN: 87 mg/dL — ABNORMAL HIGH (ref 8–23)
BUN: 64 mg/dL — ABNORMAL HIGH (ref 8–23)
CO2: 22 mmol/L (ref 22–32)
CO2: 23 mmol/L (ref 22–32)
Calcium: 8.2 mg/dL — ABNORMAL LOW (ref 8.9–10.3)
Calcium: 8.2 mg/dL — ABNORMAL LOW (ref 8.9–10.3)
Chloride: 105 mmol/L (ref 98–111)
Chloride: 105 mmol/L (ref 98–111)
Creatinine, Ser: 1.56 mg/dL — ABNORMAL HIGH (ref 0.61–1.24)
Creatinine, Ser: 1.33 mg/dL — ABNORMAL HIGH (ref 0.61–1.24)
GFR, Estimated: 46 mL/min — ABNORMAL LOW (ref >60)
GFR, Estimated: 55 mL/min — ABNORMAL LOW (ref >60)
Glucose, Bld: 226 mg/dL — ABNORMAL HIGH (ref 70–99)
Glucose, Bld: 193 mg/dL — ABNORMAL HIGH (ref 70–99)
Phosphorus: 5 mg/dL — ABNORMAL HIGH (ref 2.5–4.6)
Phosphorus: 4.6 mg/dL (ref 2.5–4.6)
Potassium: 4.5 mmol/L (ref 3.5–5.1)
Potassium: 4.8 mmol/L (ref 3.5–5.1)
Sodium: 137 mmol/L (ref 135–145)
Sodium: 137 mmol/L (ref 135–145)

## 2021-05-26 LAB — CBC
HCT: 33 % — ABNORMAL LOW (ref 39.0–52.0)
Hemoglobin: 10.3 g/dL — ABNORMAL LOW (ref 13.0–17.0)
MCH: 31.1 pg (ref 26.0–34.0)
MCHC: 31.2 g/dL (ref 30.0–36.0)
MCV: 99.7 fL (ref 80.0–100.0)
Platelets: 177 10*3/uL (ref 150–400)
RBC: 3.31 MIL/uL — ABNORMAL LOW (ref 4.22–5.81)
RDW: 14.3 % (ref 11.5–15.5)
WBC: 13.4 10*3/uL — ABNORMAL HIGH (ref 4.0–10.5)
nRBC: 0 % (ref 0.0–0.2)

## 2021-05-26 LAB — CULTURE, RESPIRATORY W GRAM STAIN: Culture: NORMAL

## 2021-05-26 LAB — COOXEMETRY PANEL
Carboxyhemoglobin: 1.2 % (ref 0.5–1.5)
Methemoglobin: 1 % (ref 0.0–1.5)
O2 Saturation: 79.4 %
Total hemoglobin: 7.5 g/dL — ABNORMAL LOW (ref 12.0–16.0)

## 2021-05-26 LAB — HEPARIN LEVEL (UNFRACTIONATED)
Heparin Unfractionated: 0.13 IU/mL — ABNORMAL LOW (ref 0.30–0.70)
Heparin Unfractionated: 0.28 IU/mL — ABNORMAL LOW (ref 0.30–0.70)

## 2021-05-26 LAB — MAGNESIUM: Magnesium: 2.6 mg/dL — ABNORMAL HIGH (ref 1.7–2.4)

## 2021-05-26 LAB — PROCALCITONIN: Procalcitonin: 0.26 ng/mL

## 2021-05-26 MED ORDER — FENTANYL CITRATE (PF) 100 MCG/2ML IJ SOLN
INTRAMUSCULAR | Status: AC
  Administered 2021-05-26: 50 ug via INTRAVENOUS
  Filled 2021-05-26: qty 2

## 2021-05-26 MED ORDER — AQUAPHOR EX OINT
TOPICAL_OINTMENT | Freq: Two times a day (BID) | CUTANEOUS | Status: DC | PRN
  Administered 2021-05-27 – 2021-05-28 (×2): 1 via TOPICAL
  Filled 2021-05-26: qty 50

## 2021-05-26 MED ORDER — MIDODRINE HCL 5 MG PO TABS
10.0000 mg | ORAL_TABLET | Freq: Three times a day (TID) | ORAL | Status: DC
  Administered 2021-05-26 – 2021-05-27 (×6): 10 mg
  Filled 2021-05-26 (×6): qty 2

## 2021-05-26 MED ORDER — CHLORHEXIDINE GLUCONATE 0.12 % MT SOLN
OROMUCOSAL | Status: AC
  Administered 2021-05-26: 15 mL via OROMUCOSAL
  Filled 2021-05-26: qty 15

## 2021-05-26 MED ORDER — FENTANYL CITRATE (PF) 100 MCG/2ML IJ SOLN
50.0000 ug | INTRAMUSCULAR | Status: DC | PRN
Start: 2021-05-26 — End: 2021-05-27
  Administered 2021-05-26: 100 ug via INTRAVENOUS
  Administered 2021-05-27: 50 ug via INTRAVENOUS
  Filled 2021-05-26 (×3): qty 2

## 2021-05-26 NOTE — Progress Notes (Addendum)
NAME:  [name], MRN:  [id number], DOB:  Aug 14, 1946, LOS: 33 ADMISSION DATE:  05/05/2021, CONSULTATION DATE:  05/09/2021 REFERRING MD:  Dr. Randal Buba, CHIEF COMPLAINT:  SOB  History of Present Illness:  [name] is a 75 yo M with past medical hx as below presenting from home with complaints of SOB/ respiratory distress on 5/29.  Treated by EMS with duonebs, epi, solumedrol, and mag without improvement, requiring BVM.  On arrival to ER, patient required intubation as he was peri- arrest, unresponsive, bradycardic, temp 95.9, hypotensive, and apneic with diffuse apical wheeze requiring emergent intubation on arrival.  Noted to have pink frothy secretions on intubation.  PCCM called for admit.   Pertinent  Medical History  Morbid obesity, HFrEF (12/24/20 EF 40-45%, G2DD, PAF, mildly reduced systolic RV, mild MR), VT on amio/ ICD, CAD s/p CABG 1996, LBBB, HLD, COPD, OSA on CPAP, AAA (s/p repair 2015), NSCL lung CA s/p XRT 2018, anemia, BPH, DMT2, GERD, hiatal hernia, PVD, tremor  Significant Hospital Events: Including procedures, antibiotic start and stop dates in addition to other pertinent events   5/30 admitted for respiratory failure/ intubated, empiric CAP coverage/ duresis 6/1 BLLE Duplex> no DVT, unable to complete thoracentesis due to anatomy  6/2 Tmax 101, -2.9L in last 24 hours, on levo at 14mcg, precedex. Resp culture negative. BC pending. 6/3 switched from heparin to argatroban for thrombocytopenia, HIT ab pending 6/4 HIT AB negative, switched back to heparin, failed SBT due to tachypnea 6/7 afib w/ RVR, HR increased abruptly to 140s from 90s, received amio bolus and started on drip, got fentanyl and versed, started cefepime and vanco, d/c norepi, receiving vasopressin, precedex, phenylephrine 6/9 - Stopped cefepime and started meropenem, continued vanco due to concern for cefepime- associated drug rash 6/12 unable to extubate. Afib RVR worse 6/13: on mech vent; Levo at 2; 60 Amio;  Afib RVR persist; TEE and cardioverted 150 J into sinus rhythm and HR in 80s 6/14: Levo increased to 20; WBC and fever trending up; cultures sent; started back on meropenem and vanc 6/15: creatine function worsening; nephrology consulted  Interim History / Subjective:   No distress sedated    Objective   Blood pressure (Abnormal) 106/39, pulse 70, temperature 98.1 F (36.7 C), temperature source Oral, resp. rate (Abnormal) 24, height 5\' 7"  (1.702 m), weight 134.4 kg, SpO2 100 %. CVP:  [11 mmHg-18 mmHg] 13 mmHg  Vent Mode: PRVC FiO2 (%):  [40 %] 40 % Set Rate:  [24 bmp] 24 bmp Vt Set:  [530 mL] 530 mL PEEP:  [5 cmH20] 5 cmH20 Plateau Pressure:  [16 cmH20-21 cmH20] 20 cmH20   Intake/Output Summary (Last 24 hours) at 05/26/2021 0838 Last data filed at 05/26/2021 0800 Gross per 24 hour  Intake 4049.78 ml  Output 5237 ml  Net -1187.22 ml    Filed Weights   05/24/21 0246 05/25/21 0500 05/26/21 0431  Weight: 131.2 kg 134.4 kg 134.4 kg    Examination:  General: Critically ill-appearing 75 year old male he is sedated on Precedex infusion, remains on full ventilator support and still requiring vasoactive drips HEENT normocephalic atraumatic orally intubated no JVD appreciated Pulmonary: Coarse scattered rhonchi no accessory use currently on minimal vent support Cardiac: Regular rate and rhythm no murmur rub or gallop appreciated Extremities: Warm, dependent edema, pulses palpable Neuro: Currently heavily sedated Abdomen: Soft, not tender, no organomegaly.  Tolerating tube feeds GU: Minimal urine output   Labs/imaging that I havepersonally reviewed  (right click and "Reselect all SmartList Selections"  daily)   Reviewed see below   Resolved Hospital Problem list    Thrombocytopenia 4T score= 4, intermediate risk for HIT, although B- lactams can also decrease platelets. HIT Ab negative Hypernatremia  Assessment & Plan:   Acute hypoxic/ hypercarbic respiratory failure 2/2  diffuse bilateral infiltrates (mix of PNA, edema and atx) Further c/b Hx of COPD -looks like at this point volume excess and body habitus are the large contributing factor  Plan Cont full vent support Cont pulmicort, brovana and yupelri Volume removal w/ CRRT (increasing net neg goal to -200 today) PAD protocol (see below) VAP bundle F/u pending sputum culture from 6/14 Day 3 meropenem   Septic Shock 2/2 PNA (NOS to date) Has Staph epidermis growing from 1 blood culture so think this is a contamination  Wbc trending down Plan Cont IV meropenem (will plan for 7 d rx total)-->rash w/ cefepime so will keep w/ meropenem  Titrate norepi for MAP > 65 Add Midodrine  Aiming for neg volume status His random cortisol is 19. Could make a argument for stress dose steroids but seems like hemodynamics slowly improving so will hold off for now    Acute on chronic HFrEF (12/24/20 EF 40-45%, G2DD, mildly reduced systolic RV, mild MR) >> LVEF down to 20-25% this admission Plan Volume removal Holding antihypertensives due to shock state Tele   Atrial fibrillation with RVR:  - Hx of VT on amio w/ICD, CAD s/p CABG 1996, LBBB, HTN, PAF Plan: Cont rx per HF team, on amio gtt Tele  Heparin gtt per pharmacy   Acute metabolic encephalopathy superimposed on baseline anxiety disorder Plan Cont Clonazepam Wean precedex (goal RASS -1)   AKI on CKD 3b, hx BPH, phimosis on PTA, finasteride  Obstructive uropathy  Phimosis required bedside dilation by urology for foley placement 5/30; Remove foley 6/8 due to concern for infection; replaced 6/9 by urology Plan Cont CRRT Keeping foley will need to add back finasteride when able He gets to a point where he is more stable we can consider voiding trials but given the need for dilation from urology They would need to be involved.  Am chem   Intermittent electrolyte imbalance: was hyperkalemic Better now Plan Monitor and correct as  indicated   Hyperglycemia, uncontrolled Hx T2DM, A1C 5.9 05/09/21 Plan Insulin gtt  Constipation (improving) Plan: Bowel regimen   Hypothyroidism, stable Plan: Cont synthroid    Class 3 obesity, stable Plan: Edu when able    Best Practice (right click and "Reselect all SmartList Selections" daily)   Diet/type: tubefeeds Pain/Anxiety/Delirium protocol Yes and RASS goal -1 VAP protocol (if indicated): Yes DVT prophylaxis: systemic heparin GI prophylaxis: PPI Glucose control:  insulin gtt.  Central venous access:  Yes, and it is still needed Arterial line:  Yes, and it is still needed Foley:  Yes, and it is still needed Mobility:  bed rest  PT consulted: [name] Studies pending: None Culture data pending:sputum Last reviewed culture data:today Antibiotics:meropenem Antibiotic de-escalation: no,  continue current rx Stop date: ordered Daily labs: requested Code Status:  full code Last date of multidisciplinary goals of care discussion [pending] Life-threating Disposition: remains critically ill, will stay in intensive care  My cct 43 minutes   Erick Colace ACNP-BC [place] Pager # [phone] OR # [phone] if no answer

## 2021-05-26 NOTE — Progress Notes (Signed)
Patient ID: [name], male   DOB: Oct 28, 1946, 75 y.o.   MRN: [id number]     Advanced Heart Failure Rounding Note  PCP-Cardiologist: None   Subjective:    Remains intubated and sedated. Underwent DCCV 6/13. Maintaining NSR.   He is on vanc + meropenem. CXR w/ bibasilar airspace disease.  Afebrile. WBCs lower at 13.   On NE 8, Amio gtt at 30/hr  + heparin. Co-ox 79%, CVP 13.   CVVH started yesterday, I/Os -636 and weight unchanged.   Patient is sedated currently.   TEE (6/13): EF 20-25%, mildly decreased RV systolic function   Objective:   Weight Range: 134.4 kg Body mass index is 46.41 kg/m.   Vital Signs:   Temp:  [95.4 F (35.2 C)-99.14 F (37.3 C)] 98.1 F (36.7 C) (06/16 0400) Pulse Rate:  [70-72] 70 (06/16 0700) Resp:  [18-33] 25 (06/16 0700) BP: (90-140)/(30-52) 140/52 (06/15 1514) SpO2:  [99 %-100 %] 100 % (06/16 0700) Arterial Line BP: (94-148)/(39-67) 109/48 (06/16 0700) FiO2 (%):  [40 %] 40 % (06/16 0600) Weight:  [134.4 kg] 134.4 kg (06/16 0431) Last BM Date: 05/25/21  Weight change: Filed Weights   05/24/21 0246 05/25/21 0500 05/26/21 0431  Weight: 131.2 kg 134.4 kg 134.4 kg    Intake/Output:   Intake/Output Summary (Last 24 hours) at 05/26/2021 0754 Last data filed at 05/26/2021 0700 Gross per 24 hour  Intake 4376.44 ml  Output 5012 ml  Net -635.56 ml      Physical Exam    CVP 13 General: NAD Neck: JVP 12-14 cm, no thyromegaly or thyroid nodule.  Lungs: Clear to auscultation bilaterally with normal respiratory effort. CV: Nondisplaced PMI.  Heart regular S1/S2, no S3/S4, no murmur.  1+ edema to knees and elbows.  Abdomen: Soft, nontender, no hepatosplenomegaly, no distention.  Skin: Intact without lesions or rashes.  Neurologic: Sedated on vent Extremities: No clubbing or cyanosis.  HEENT: Normal.    Telemetry    A-BiV paced 80s  (personally reviewed)   Labs    CBC Recent Labs    05/25/21 0431 05/26/21 0410  WBC 16.3*  13.4*  HGB 9.9* 10.3*  HCT 33.0* 33.0*  MCV 102.8* 99.7  PLT 187 939   Basic Metabolic Panel Recent Labs    05/25/21 0431 05/25/21 1604 05/26/21 0410  NA 141 141 137  K 5.4* 5.1 4.5  CL 110 111 105  CO2 16* 18* 22  GLUCOSE 192* 211* 226*  BUN 138* 132* 87*  CREATININE 2.65* 2.33* 1.56*  CALCIUM 8.3* 8.1* 8.2*  MG 2.8*  --  2.6*  PHOS  --  7.3* 5.0*   Liver Function Tests Recent Labs    05/25/21 1604 05/26/21 0410  ALBUMIN 1.9* 2.0*   No results for input(s): LIPASE, AMYLASE in the last 72 hours. Cardiac Enzymes No results for input(s): CKTOTAL, CKMB, CKMBINDEX, TROPONINI in the last 72 hours.  BNP: BNP (last 3 results) Recent Labs    05/07/2021 2322 05/14/21 0500 05/16/21 0340  BNP 281.2* 216.4* 78.5    ProBNP (last 3 results) No results for input(s): PROBNP in the last 8760 hours.   D-Dimer No results for input(s): DDIMER in the last 72 hours. Hemoglobin A1C No results for input(s): HGBA1C in the last 72 hours. Fasting Lipid Panel No results for input(s): CHOL, HDL, LDLCALC, TRIG, CHOLHDL, LDLDIRECT in the last 72 hours. Thyroid Function Tests No results for input(s): TSH, T4TOTAL, T3FREE, THYROIDAB in the last 72 hours.  Invalid input(s): FREET3  Other results:   Imaging    DG Chest Port 1 View  Result Date: 05/25/2021 CLINICAL DATA:  Central line placement for dialysis EXAM: PORTABLE CHEST 1 VIEW COMPARISON:  05/24/2021 FINDINGS: Right jugular dual lumen catheter tip in the innominate vein, just above the SVC. No pneumothorax Right arm PICC tip in the proximal SVC. Endotracheal tube in good position. NG tube in place. AICD unchanged in position Cardiac enlargement without pulmonary edema. Postsurgical changes right upper lobe with surrounding soft tissue density unchanged. Mild bibasilar airspace disease, unchanged. IMPRESSION: Right jugular dual lumen catheter in the lower innominate vein on the right. No pneumothorax Persistent bibasilar airspace  disease unchanged. Electronically Signed   By: Franchot Gallo M.D.   On: 05/25/2021 14:52     Medications:     Scheduled Medications:  arformoterol  15 mcg Nebulization BID   aspirin  81 mg Per Tube Daily   atorvastatin  80 mg Per Tube Daily   bethanechol  10 mg Per Tube TID   budesonide (PULMICORT) nebulizer solution  0.5 mg Nebulization BID   chlorhexidine gluconate (MEDLINE KIT)  15 mL Mouth Rinse BID   Chlorhexidine Gluconate Cloth  6 each Topical Q0600   clonazePAM  1 mg Per Tube BID   docusate  100 mg Per Tube BID   feeding supplement (PROSource TF)  90 mL Per Tube TID   Gerhardt's butt cream   Topical QID   levothyroxine  50 mcg Per Tube Q0600   mouth rinse  15 mL Mouth Rinse 10 times per day   pantoprazole sodium  40 mg Per Tube Daily   polyethylene glycol  17 g Per Tube Daily   revefenacin  175 mcg Nebulization Daily   senna  1 tablet Per Tube Daily   sodium chloride flush  10-40 mL Intracatheter Q12H    Infusions:   prismasol BGK 4/2.5 500 mL/hr at 05/25/21 2306    prismasol BGK 4/2.5 200 mL/hr at 05/26/21 0034   sodium chloride 20 mL/hr at 05/21/21 1840   sodium chloride     amiodarone 30 mg/hr (05/26/21 0700)   dexmedetomidine (PRECEDEX) IV infusion 0.9 mcg/kg/hr (05/26/21 0700)   feeding supplement (VITAL 1.5 CAL) 1,000 mL (05/25/21 1733)   heparin 1,600 Units/hr (05/26/21 0700)   insulin 7.5 Units/hr (05/26/21 0700)   meropenem (MERREM) IV Stopped (05/26/21 0156)   norepinephrine (LEVOPHED) Adult infusion 8 mcg/min (05/26/21 0700)   prismasol BGK 4/2.5 2,000 mL/hr at 05/26/21 0611   vancomycin Stopped (05/25/21 1925)    PRN Medications: [CANCELED] Place/Maintain arterial line **AND** sodium chloride, acetaminophen (TYLENOL) oral liquid 160 mg/5 mL, dextrose, fentaNYL, fentaNYL (SUBLIMAZE) injection, heparin, metoprolol tartrate, sodium chloride, sodium chloride flush  Assessment/Plan    Shock: Suspect combination of septic shock and cardiogenic shock in  setting of recurrent AF.  Echo 05/12/21 EF  20-25% (previous echo EF 35-40%). Bcx NGTD.  Resp cx - rare GPC.  Atrial flutter/RVR likely played a role. S/p DCCV to NSR 6/13 (TEE with EF 20-25%, mild RV dysfunction). Suspect significant vasodilatory shock component. Covering w/ vanc + mero. On NE 8. Co-ox 79%.  - Continue to wean NE as able.   - Continue abx  2. 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 Echo 05/12/21 EF 20-25% (previous Echo EF 35-40%).  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 Last cath 1/19 (LAD is patent, other vessels occluded with collaterals from the LAD;  SVG to OM and SVG to PDA chronically occluded).  Co-ox 79% on NE 8. CVP 13-14.  He had progressive renal failure, now on CVVH.   - Pull net negative UF up to 100 cc/hr via CVVH today.  - Wean off NE as able today.  - Can consider repeat cath in future, if renal function improves, but doubt ischemia is driving factor 3. Recurrent AF/AFL with RVR: H/o DCCV in 12/21. S/p DCCV 6/13. Maintaining NSR.  - Continue amiodarone gtt, 30 mg/hr - Continue heparin gtt. 4. Acute hypercapenic/hypoxic respiratory failure: H/o COPD. Remains on vent, failed SBT.  Need to try to pull off fluid via CVVH and get him extubated.  Would be poor candidate for trach given his underlying problems.  - Wean sedation today.  - CCM managing, aim for more fluid off to get extubated.  5. AKI on CKD IV: Baseline creatinine 1.3-1.7.  With AKI and volume overload, CVVH begun.  - Aim for 100 cc/hr net UF via CVVH as above.  Would not be a good long-term HD candidate.  6. CAD: h/o CABG with occluded grafts.  Last cath 1/19 (LAD is patent, other vessels occluded with collaterals from the LAD; SVG to OM and SVG to PDA chronically occluded) - continue ASA/statin - Can consider repeat cath in future but doubt ischemia is driving factor 7. Thrombocytopenia: Resolved. -  HIT negative 8. DM2 - per CCM  9. Urinary retention/hematuria: Foley had to be placed by Urology.  Suspect local Foley trauma.  10. COPD: Moderate to severe.  11. 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  12. Lung cancer: Had radiation.  13.  PAD: Occluded left SFA and significant right SFA stenosis on last CTA.  ABIs in 3/20 with significant disease bilaterally but stable.  14. ID: As above, concerned for septic shock component. Staph epi blood culture 1/4, likely contaminant.  - c/w vancomycin/meropenem for now    CRITICAL CARE Performed by: [name]  Total critical care time: 40 minutes  Critical care time was exclusive of separately billable procedures and treating other patients.  Critical care was necessary to treat or prevent imminent or life-threatening deterioration.  Critical care was time spent personally by me on the following activities: development of treatment plan with patient and/or surrogate as well as nursing, discussions with consultants, evaluation of patient's response to treatment, examination of patient, obtaining history from patient or surrogate, ordering and performing treatments and interventions, ordering and review of laboratory studies, ordering and review of radiographic studies, pulse oximetry and re-evaluation of patient's condition.  [name] 05/26/2021 7:54 AM

## 2021-05-26 NOTE — Progress Notes (Signed)
PT Cancellation Note/ Discharge  Patient Details Name: JAICOB DIA MRN: [id number] DOB: October 14, 1946   Cancelled Treatment:    Reason Eval/Treat Not Completed: Medical issues which prohibited therapy (pt with continued medical decline, sedated on vent on CRRT and unable to participate. will sign off)   Mckenleigh Tarlton B Stephaie Dardis 05/26/2021, 7:38 AM Bayard Males, PT Acute Rehabilitation Services Pager: [phone] Office: [phone]

## 2021-05-26 NOTE — Progress Notes (Signed)
[name] Progress Note    Assessment/ Plan:    AKI on CKD 3b: Cr baseline 1.5-1.6 and now up to 2.65 with massive volume overload and increasing azotemia to 138 and K 5.4.  Discussed situation with his daughter.  Recommend CRRT for correction of electrolyte and volume abnormalities.  Plan as follows: - started CRRT 05/25/21 for correction of the above -discussed with pt's dtr that time- limited trial is most appropriate--> we will trial 3-4 days and then reassess to see if CRRT and volume removal has allowed pt's mentation/ respiratory status to improve - all 4K bath, no heparin (on systemic gtt); increase UF to 100-200 mL/ net neg today    2.  Acute on chronic systolic CHF exac: - massively volume overloaded - on Lasix gtt for now, if tolerates aggressive UF with CRRT can stop   3.  Shock: likely septic, ? PNA - per primary - vanc/ meropenem - on levophed   4.  Afib/ flutter: - s/p DCCV 05/23/21 - on hep gtt   5.  DM II: - on insulin gtt   6.  Hyperkalemia: - will improve wth CRRT   7.  Dispo: in ICU  Subjective:    Seen in room.  CRRT started yesterday, achieving UF goal.  No big changes in overall status.     Objective:   BP (!) 110/49   Pulse 70   Temp 98.1 F (36.7 C) (Oral)   Resp (!) 24   Ht 5' 7"  (1.702 m)   Wt 134.4 kg   SpO2 100%   BMI 46.41 kg/m   Intake/Output Summary (Last 24 hours) at 05/26/2021 0905 Last data filed at 05/26/2021 0900 Gross per 24 hour  Intake 4178.41 ml  Output 5237 ml  Net -1058.59 ml   Weight change: 0 kg  Physical Exam: GEN: NAD,lying in bed HEENT eyes closed NECK + JVD PULM coarse rhonchi throughout anteriorly CV RRR ABD soft, mildly distended with abd wall edema EXT 2+ anasarca with areas of weeping NEURO intubated and sedated ACCESS: R IJ nontunneled HD cath  Imaging: DG Chest Port 1 View  Result Date: 05/25/2021 CLINICAL DATA:  Central line placement for dialysis EXAM: PORTABLE CHEST 1 VIEW  COMPARISON:  05/24/2021 FINDINGS: Right jugular dual lumen catheter tip in the innominate vein, just above the SVC. No pneumothorax Right arm PICC tip in the proximal SVC. Endotracheal tube in good position. NG tube in place. AICD unchanged in position Cardiac enlargement without pulmonary edema. Postsurgical changes right upper lobe with surrounding soft tissue density unchanged. Mild bibasilar airspace disease, unchanged. IMPRESSION: Right jugular dual lumen catheter in the lower innominate vein on the right. No pneumothorax Persistent bibasilar airspace disease unchanged. Electronically Signed   By: Franchot Gallo M.D.   On: 05/25/2021 14:52    Labs: BMET Recent Labs  Lab 05/23/21 1600 05/24/21 0433 05/24/21 1348 05/24/21 2124 05/25/21 0431 05/25/21 1604 05/26/21 0410  NA 143 135 139 140 141 141 137  K 5.5* 4.1 5.1 5.3* 5.4* 5.1 4.5  CL 109 104 110 110 110 111 105  CO2 20* 19* 18* 17* 16* 18* 22  GLUCOSE 259* 398* 198* 177* 192* 211* 226*  BUN 96* 103* 117* 130* 138* 132* 87*  CREATININE 1.83* 1.85* 2.08* 2.48* 2.65* 2.33* 1.56*  CALCIUM 8.4* 7.8* 8.3* 8.3* 8.3* 8.1* 8.2*  PHOS  --   --   --   --   --  7.3* 5.0*   CBC Recent Labs  Lab 05/23/21 0408 05/24/21 0433 05/25/21 0431 05/26/21 0410  WBC 13.3* 14.7* 16.3* 13.4*  HGB 10.3* 9.7* 9.9* 10.3*  HCT 33.7* 32.8* 33.0* 33.0*  MCV 103.4* 104.5* 102.8* 99.7  PLT 161 193 187 177    Medications:     arformoterol  15 mcg Nebulization BID   aspirin  81 mg Per Tube Daily   atorvastatin  80 mg Per Tube Daily   bethanechol  10 mg Per Tube TID   budesonide (PULMICORT) nebulizer solution  0.5 mg Nebulization BID   chlorhexidine gluconate (MEDLINE KIT)  15 mL Mouth Rinse BID   Chlorhexidine Gluconate Cloth  6 each Topical Q0600   clonazePAM  1 mg Per Tube BID   docusate  100 mg Per Tube BID   feeding supplement (PROSource TF)  90 mL Per Tube TID   Gerhardt's butt cream   Topical QID   levothyroxine  50 mcg Per Tube Q0600    mouth rinse  15 mL Mouth Rinse 10 times per day   pantoprazole sodium  40 mg Per Tube Daily   polyethylene glycol  17 g Per Tube Daily   revefenacin  175 mcg Nebulization Daily   senna  1 tablet Per Tube Daily   sodium chloride flush  10-40 mL Intracatheter Q12H      Madelon Lips, MD 05/26/2021, 9:05 AM

## 2021-05-26 NOTE — Progress Notes (Signed)
ANTICOAGULATION CONSULT NOTE  Pharmacy Consult for  heparin  Indication: afib  Labs: Recent Labs    05/24/21 0433 05/24/21 1348 05/25/21 0431 05/25/21 1604 05/26/21 0410 05/26/21 1546 05/26/21 1702  HGB 9.7*  --  9.9*  --  10.3*  --   --   HCT 32.8*  --  33.0*  --  33.0*  --   --   PLT 193  --  187  --  177  --   --   HEPARINUNFRC 0.34  --  0.32  --  0.13*  --  0.28*  CREATININE 1.85*   < > 2.65* 2.33* 1.56* 1.33*  --    < > = values in this interval not displayed.   Assessment: 75 yo male with h/o Afib, Xarelto on hold, new thrombocytopenia on heparin and concern for HIT so was switched to argatroban. Heparin antibody was negative, so switched back to heparin on 05/14/21.  Heparin level came back subtherapeutic but trending up at 0.28 after increase to 1750 units/hr. Hgb 10.3, plt 177 this AM. No s/sx of bleeding or infusion issues.  CRRT issues with clotting today - only off for 1 hour and currently running well.    Goal of Therapy:  Heparin level 0.3-0.7 units/ml Monitor platelets by anticoagulation protocol: Yes   Plan:  Increase IV heparin to 1800 units/hr. Recheck in ~8 hours (ok to do with AM labs) *Monitor clotting issues with CRRT as may need to start Heparin via machine.  Daily heparin level and CBC F/u restart of DOAC when able  Thanks for allowing pharmacy to be a part of this patient's care.  Sloan Leiter, PharmD, BCPS, BCCCP Clinical Pharmacist Please refer to [facility] for [place] numbers 05/26/2021 6:34 PM  Please check AMION for all [place] phone numbers After 10:00 PM, call [place] [phone]

## 2021-05-26 NOTE — Progress Notes (Addendum)
ANTICOAGULATION CONSULT NOTE  Pharmacy Consult for  heparin  Indication: afib  Labs: Recent Labs    05/24/21 0433 05/24/21 1348 05/25/21 0431 05/25/21 1604 05/26/21 0410  HGB 9.7*  --  9.9*  --  10.3*  HCT 32.8*  --  33.0*  --  33.0*  PLT 193  --  187  --  177  HEPARINUNFRC 0.34  --  0.32  --  0.13*  CREATININE 1.85*   < > 2.65* 2.33* 1.56*   < > = values in this interval not displayed.    Assessment: 76 yo male with h/o Afib, Xarelto on hold, new thrombocytopenia on heparin and concern for HIT so was switched to argatroban. Heparin antibody was negative, so switched back to heparin on 05/14/21.  Heparin level came back subtherapeutic at 0.13, on 1600 units/hr. Hgb 10.3, plt 177. No s/sx of bleeding or infusion issues.    Goal of Therapy:  Heparin level 0.3-0.7 units/ml Monitor platelets by anticoagulation protocol: Yes   Plan:  Increase IV heparin to 1750 units/hr. Order 8 hr HL Daily heparin level and CBC F/u restart of DOAC when able  Thanks for allowing pharmacy to be a part of this patient's care.  Antonietta Jewel, PharmD, [place] Clinical Pharmacist  Phone: [phone] 05/26/2021 7:18 AM  Please check AMION for all [place] phone numbers After 10:00 PM, call [place] [phone]

## 2021-05-26 NOTE — Consult Note (Addendum)
WOC Nurse Consult Note: Reason for Consult:Moisture associated skin damage (MASD) to bilateral gluteal folds.  Deep tissue injury to coccyx  Patient with morbid obesity, chronic CHF.  Was volume overloaded (is being diuresed) and was intubated and sedated due to respiratory distress. Has Coude catheter in place, but 2+ anasarca remains. Skin is frequently moist for this reason.  Has been febrile as well with antimicrobial therapy in place for pneumonia.  Wound type:MASD and DTI to coccyx Pressure Injury POA: No Measurement: 4 cm x 1.5 cm x 0.2 cm to each gluteal fold.  Flexiseal in place Coccyx: 2 cm x 1.5 cm x 0.2 cm peeling epithelium reveals a maroon wound bed with nonblanchable erythema to periwound Abdominal pannus is frequently moist, left side with 0.2 cm splitting.  Dermatherapy pillowcases wick moisture and provide antimicrobial protection to improve skin microclimate.  Changing several times a shift due to moisture.  Wound SWH:QPRF pink and moist Drainage (amount, consistency, odor) generalized weeping due to edema.  Periwound:edematous, moist Dressing procedure/placement/frequency:  Coccyx wound: sacral foam in place.  Peel back and assess each shift. Cleanse buttocks with NS and pat dry.  Apply Aquaphor to breakdown on buttocks twice daily and PRN soilage.  Will not follow at this time.  Please re-consult if needed.  Domenic Moras MSN, RN, FNP-BC CWON Wound, Ostomy, Continence Nurse Pager [phone]

## 2021-05-27 ENCOUNTER — Inpatient Hospital Stay (HOSPITAL_COMMUNITY): Payer: Medicare Other

## 2021-05-27 DIAGNOSIS — R6521 Severe sepsis with septic shock: Secondary | ICD-10-CM | POA: Diagnosis not present

## 2021-05-27 DIAGNOSIS — A419 Sepsis, unspecified organism: Secondary | ICD-10-CM | POA: Diagnosis not present

## 2021-05-27 LAB — CBC
HCT: 32.1 % — ABNORMAL LOW (ref 39.0–52.0)
Hemoglobin: 10.2 g/dL — ABNORMAL LOW (ref 13.0–17.0)
MCH: 31.7 pg (ref 26.0–34.0)
MCHC: 31.8 g/dL (ref 30.0–36.0)
MCV: 99.7 fL (ref 80.0–100.0)
Platelets: 147 10*3/uL — ABNORMAL LOW (ref 150–400)
RBC: 3.22 MIL/uL — ABNORMAL LOW (ref 4.22–5.81)
RDW: 14.5 % (ref 11.5–15.5)
WBC: 14.3 10*3/uL — ABNORMAL HIGH (ref 4.0–10.5)
nRBC: 0 % (ref 0.0–0.2)

## 2021-05-27 LAB — CULTURE, BLOOD (ROUTINE X 2): Special Requests: ADEQUATE

## 2021-05-27 LAB — GLUCOSE, CAPILLARY
Glucose-Capillary: 173 mg/dL — ABNORMAL HIGH (ref 70–99)
Glucose-Capillary: 171 mg/dL — ABNORMAL HIGH (ref 70–99)
Glucose-Capillary: 178 mg/dL — ABNORMAL HIGH (ref 70–99)
Glucose-Capillary: 179 mg/dL — ABNORMAL HIGH (ref 70–99)
Glucose-Capillary: 189 mg/dL — ABNORMAL HIGH (ref 70–99)
Glucose-Capillary: 139 mg/dL — ABNORMAL HIGH (ref 70–99)
Glucose-Capillary: 173 mg/dL — ABNORMAL HIGH (ref 70–99)
Glucose-Capillary: 175 mg/dL — ABNORMAL HIGH (ref 70–99)
Glucose-Capillary: 152 mg/dL — ABNORMAL HIGH (ref 70–99)
Glucose-Capillary: 164 mg/dL — ABNORMAL HIGH (ref 70–99)
Glucose-Capillary: 144 mg/dL — ABNORMAL HIGH (ref 70–99)
Glucose-Capillary: 210 mg/dL — ABNORMAL HIGH (ref 70–99)
Glucose-Capillary: 226 mg/dL — ABNORMAL HIGH (ref 70–99)

## 2021-05-27 LAB — RENAL FUNCTION PANEL
Albumin: 1.9 g/dL — ABNORMAL LOW (ref 3.5–5.0)
Albumin: 1.9 g/dL — ABNORMAL LOW (ref 3.5–5.0)
Anion gap: 10 (ref 5–15)
Anion gap: 6 (ref 5–15)
BUN: 45 mg/dL — ABNORMAL HIGH (ref 8–23)
BUN: 42 mg/dL — ABNORMAL HIGH (ref 8–23)
CO2: 25 mmol/L (ref 22–32)
CO2: 25 mmol/L (ref 22–32)
Calcium: 8 mg/dL — ABNORMAL LOW (ref 8.9–10.3)
Calcium: 7.7 mg/dL — ABNORMAL LOW (ref 8.9–10.3)
Chloride: 102 mmol/L (ref 98–111)
Chloride: 105 mmol/L (ref 98–111)
Creatinine, Ser: 0.98 mg/dL (ref 0.61–1.24)
Creatinine, Ser: 1.03 mg/dL (ref 0.61–1.24)
GFR, Estimated: >60 mL/min (ref >60)
GFR, Estimated: >60 mL/min (ref >60)
Glucose, Bld: 199 mg/dL — ABNORMAL HIGH (ref 70–99)
Glucose, Bld: 191 mg/dL — ABNORMAL HIGH (ref 70–99)
Phosphorus: 3.3 mg/dL (ref 2.5–4.6)
Phosphorus: 3.5 mg/dL (ref 2.5–4.6)
Potassium: 4.2 mmol/L (ref 3.5–5.1)
Potassium: 4.5 mmol/L (ref 3.5–5.1)
Sodium: 137 mmol/L (ref 135–145)
Sodium: 136 mmol/L (ref 135–145)

## 2021-05-27 LAB — HEPARIN LEVEL (UNFRACTIONATED)
Heparin Unfractionated: 0.24 IU/mL — ABNORMAL LOW (ref 0.30–0.70)
Heparin Unfractionated: 0.36 IU/mL (ref 0.30–0.70)
Heparin Unfractionated: 0.46 IU/mL (ref 0.30–0.70)

## 2021-05-27 LAB — COOXEMETRY PANEL
Carboxyhemoglobin: 0.6 % (ref 0.5–1.5)
Methemoglobin: 0.6 % (ref 0.0–1.5)
O2 Saturation: 73.5 %
Total hemoglobin: 14.1 g/dL (ref 12.0–16.0)

## 2021-05-27 LAB — MAGNESIUM: Magnesium: 2.4 mg/dL (ref 1.7–2.4)

## 2021-05-27 MED ORDER — POLYETHYLENE GLYCOL 3350 17 G PO PACK: 17.0000 g | PACK | Freq: Every day | ORAL | Status: DC | PRN

## 2021-05-27 MED ORDER — CLONAZEPAM 0.5 MG PO TABS
0.5000 mg | ORAL_TABLET | Freq: Two times a day (BID) | ORAL | Status: DC
  Administered 2021-05-27 – 2021-05-31 (×9): 0.5 mg
  Filled 2021-05-27 (×9): qty 1

## 2021-05-27 MED ORDER — PANTOPRAZOLE SODIUM 40 MG PO PACK
40.0000 mg | PACK | Freq: Two times a day (BID) | ORAL | Status: DC
  Administered 2021-05-27 – 2021-06-01 (×10): 40 mg
  Filled 2021-05-27 (×10): qty 20

## 2021-05-27 MED ORDER — LOPERAMIDE HCL 1 MG/7.5ML PO SUSP
4.0000 mg | Freq: Once | ORAL | Status: AC
  Administered 2021-05-27: 4 mg
  Filled 2021-05-27: qty 30

## 2021-05-27 MED ORDER — FENTANYL CITRATE (PF) 100 MCG/2ML IJ SOLN
12.5000 ug | INTRAMUSCULAR | Status: DC | PRN
  Administered 2021-05-28 – 2021-05-29 (×2): 25 ug via INTRAVENOUS
  Filled 2021-05-27 (×2): qty 2

## 2021-05-27 NOTE — Progress Notes (Addendum)
NAME:  [name], MRN:  [id number], DOB:  1946/05/22, LOS: 30 ADMISSION DATE:  05/02/2021, CONSULTATION DATE:  05/09/2021 REFERRING MD:  Dr. Randal Buba, CHIEF COMPLAINT:  SOB  History of Present Illness:  [name] is a 75 yo M with past medical hx as below presenting from home with complaints of SOB/ respiratory distress on 5/29.  Treated by EMS with duonebs, epi, solumedrol, and mag without improvement, requiring BVM.  On arrival to ER, patient required intubation as he was peri- arrest, unresponsive, bradycardic, temp 95.9, hypotensive, and apneic with diffuse apical wheeze requiring emergent intubation on arrival.  Noted to have pink frothy secretions on intubation.  PCCM called for admit.   Pertinent  Medical History  Morbid obesity, HFrEF (12/24/20 EF 40-45%, G2DD, PAF, mildly reduced systolic RV, mild MR), VT on amio/ ICD, CAD s/p CABG 1996, LBBB, HLD, COPD, OSA on CPAP, AAA (s/p repair 2015), NSCL lung CA s/p XRT 2018, anemia, BPH, DMT2, GERD, hiatal hernia, PVD, tremor  Significant Hospital Events: Including procedures, antibiotic start and stop dates in addition to other pertinent events   5/30 admitted for respiratory failure/ intubated, empiric CAP coverage/ duresis 6/1 BLLE Duplex> no DVT, unable to complete thoracentesis due to anatomy  6/2 Tmax 101, -2.9L in last 24 hours, on levo at 55mcg, precedex. Resp culture negative. BC pending. 6/3 switched from heparin to argatroban for thrombocytopenia, HIT ab pending 6/4 HIT AB negative, switched back to heparin, failed SBT due to tachypnea 6/7 afib w/ RVR, HR increased abruptly to 140s from 90s, received amio bolus and started on drip, got fentanyl and versed, started cefepime and vanco, d/c norepi, receiving vasopressin, precedex, phenylephrine 6/9 - Stopped cefepime and started meropenem, continued vanco due to concern for cefepime- associated drug rash 6/12 unable to extubate. Afib RVR worse 6/13: on mech vent; Levo at 2; 60 Amio;  Afib RVR persist; TEE and cardioverted 150 J into sinus rhythm and HR in 80s 6/14: Levo increased to 20; WBC and fever trending up; cultures sent; started back on meropenem and vanc 6/15: creatine function worsening; nephrology consulted, started on CRRT 6/16: Net UF goal increased.  Tolerated well.  Sputum came back as normal flora stop date placed for antibiotics.  Tolerated just over 2 hours of pressure support ventilation 6/17 continuing to pull fluid but decreased UF goal.  Decreasing RASS goal to 0 and attempt to more aggressively wean.  Decreased clonazepam to 0.5 mg twice a day from 1 mg twice a day, decreased as needed fentanyl dosing.   Interim History / Subjective:  No acute issues overnight. Now -1.5 L since admission seems to be tolerating volume removal Objective   Blood pressure (Abnormal) 142/46, pulse 70, temperature 98.9 F (37.2 C), temperature source Axillary, resp. rate (Abnormal) 23, height 5\' 7"  (1.702 m), weight 131.7 kg, SpO2 99 %. CVP:  [6 mmHg-13 mmHg] 9 mmHg  Vent Mode: PSV;CPAP FiO2 (%):  [40 %] 40 % Set Rate:  [24 bmp] 24 bmp Vt Set:  [530 mL] 530 mL PEEP:  [5 cmH20] 5 cmH20 Pressure Support:  [5 cmH20-8 cmH20] 5 cmH20 Plateau Pressure:  [19 cmH20-25 cmH20] 19 cmH20   Intake/Output Summary (Last 24 hours) at 05/27/2021 0825 Last data filed at 05/27/2021 0800 Gross per 24 hour  Intake 4196.79 ml  Output 7315 ml  Net -3118.21 ml    Filed Weights   05/25/21 0500 05/26/21 0431 05/27/21 0126  Weight: 134.4 kg 134.4 kg 131.7 kg    Examination:  General: This is an obese 75 year old male he remains sedated on Precedex infusion HEENT: Normocephalic atraumatic orally intubated no notable jugular venous distention Pulmonary: Coarse, bilaterally.  No accessory use. Cardiac: Regular rate and rhythm Extremities: Diffuse anasarca brisk capillary refill, pulses palpable scattered areas of ecchymosis Abdomen obese soft present bowel sounds has Flexi-Seal in  rectum with liquid stool GU: Clear yellow urine Via Foley catheter Neuro: Sedated  Labs/imaging that I havepersonally reviewed  (right click and "Reselect all SmartList Selections" daily)   Reviewed see below   Resolved Hospital Problem list    Thrombocytopenia 4T score= 4, intermediate risk for HIT, although B- lactams can also decrease platelets. HIT Ab negative Hypernatremia  Assessment & Plan:   Acute hypoxic/ hypercarbic respiratory failure 2/2 diffuse bilateral infiltrates (mix of PNA, edema and atx) Further c/b Hx of COPD -Think volume excess, and body habitus, as well as sedation her primary barriers at this point.  We have made some progress in regards to his volume status but he still has anasarca Plan Initiate pressure support ventilation  Need to change RASS goal to 0, he remains oversedated  Continue Pulmicort, Brovana, and Yupelri  Continue volume removal as tolerated, net goal addressed by heart failure team  Day #4 meropenem, planning on a 7-day course  Continue VAP bundle  A.m. chest x-ray  Septic Shock 2/2 PNA (NOS to date) Has Staph epidermis growing from 1 blood culture so think this is a contamination  Pressor requirements improving Plan Meropenem for 7 days stop date placed  Continue midodrine 3 times daily  Continue to wean pressors  Volume status as addressed by cardiology  Holding off on stress dose steroids as hemodynamics have improved (his random cortisol was 19)   Acute on chronic HFrEF (12/24/20 EF 40-45%, G2DD, mildly reduced systolic RV, mild MR) >> LVEF down to 20-25% this admission Plan Volume removal as tolerated Holding off on antihypertensives Continue telemetry.  Atrial fibrillation with RVR:  - Hx of VT on amio w/ICD, CAD s/p CABG 1996, LBBB, HTN, PAF Plan: Heparin drip per pharmacy Amiodarone Telemetry  Acute metabolic encephalopathy superimposed on baseline anxiety disorder He is oversedated currently as of  6/17 Plan Precedex infusion for RASS goal of 0 Change clonazepam to 0.5 mg via tube twice daily Decrease fentanyl as needed dosing Supportive care  AKI on CKD 3b, hx BPH, phimosis on PTA, finasteride  Obstructive uropathy  Phimosis required bedside dilation by urology for foley placement 5/30; Remove foley 6/8 due to concern for infection; replaced 6/9 by urology Plan Continuing CRRT  Keep Foley in place, add back finasteride when able  A.m. chemistry  We will hold off on removing Foley for now given need for dilation during last insertion,  Intermittent electrolyte imbalance Plan Monitor and replace as indicated   Hyperglycemia, uncontrolled Hx T2DM, A1C 5.9 05/09/21 Plan Continue insulin drip  Constipation (improving) Plan: Bowel regimen  Hypothyroidism, stable Plan: Synthroid   Class 3 obesity, stable Plan: Will need nutritional/dietary consult if improves  Best Practice (right click and "Reselect all SmartList Selections" daily)   Diet/type: tubefeeds Pain/Anxiety/Delirium protocol Yes and RASS goal: 0 VAP protocol (if indicated): Yes DVT prophylaxis: systemic heparin GI prophylaxis: PPI Glucose control:  insulin gtt.  Central venous access:  Yes, and it is still needed Arterial line:  Yes, and it is still needed Foley:  Yes, and it is still needed Mobility:  bed rest  PT consulted: [name] Studies pending: None Culture data pending:none Last reviewed culture  data:today Antibiotics:meropenem Antibiotic de-escalation: no,  continue current rx Stop date: ordered Daily labs: requested Code Status:  full code Last date of multidisciplinary goals of care discussion [pending] Life-threating Disposition: remains critically ill, will stay in intensive care  My cct 32 min  Erick Colace ACNP-BC [place] Pager # [phone] OR # [phone] if no answer

## 2021-05-27 NOTE — Progress Notes (Signed)
Patient ID: [name], male   DOB: 1946/03/01, 75 y.o.   MRN: [id number]     Advanced Heart Failure Rounding Note  PCP-Cardiologist: None   Subjective:    Remains intubated and sedated. Underwent DCCV 6/13. Maintaining NSR.   He is on meropenem. Last CXR w/ bibasilar airspace disease.  Afebrile. WBCs pending.   On NE 6, Amio gtt at 30/hr  + heparin. Co-ox 74%, CVP 9.   CVVH pulling 100-200 cc/hr over the last day, weight down 6 lbs.    Patient is sedated currently on low dose propofol.    TEE (6/13): EF 20-25%, mildly decreased RV systolic function   Objective:   Weight Range: 131.7 kg Body mass index is 45.47 kg/m.   Vital Signs:   Temp:  [96.8 F (36 C)-98.96 F (37.2 C)] 97.6 F (36.4 C) (06/17 0358) Pulse Rate:  [70-72] 70 (06/17 0515) Resp:  [17-33] 25 (06/17 0515) BP: (106-142)/(39-49) 142/46 (06/16 1540) SpO2:  [97 %-100 %] 100 % (06/17 0515) Arterial Line BP: (93-152)/(37-56) 112/45 (06/17 0515) FiO2 (%):  [40 %] 40 % (06/17 0310) Weight:  [131.7 kg] 131.7 kg (06/17 0126) Last BM Date: 05/26/21  Weight change: Filed Weights   05/25/21 0500 05/26/21 0431 05/27/21 0126  Weight: 134.4 kg 134.4 kg 131.7 kg    Intake/Output:   Intake/Output Summary (Last 24 hours) at 05/27/2021 0529 Last data filed at 05/27/2021 0507 Gross per 24 hour  Intake 4210.72 ml  Output 7434 ml  Net -3223.28 ml      Physical Exam    CVP 9 General: Sedated on vent Neck: JVP 8-9, no thyromegaly or thyroid nodule.  Lungs: Clear to auscultation bilaterally with normal respiratory effort. CV: Nondisplaced PMI.  Heart regular S1/S2, no S3/S4, no murmur.  No peripheral edema.  N Abdomen: Soft, nontender, no hepatosplenomegaly, no distention.  Skin: Intact without lesions or rashes.  Neurologic: Will open eyes, not following commands.  Extremities: No clubbing or cyanosis.  HEENT: Normal.    Telemetry    A-BiV paced 80s  (personally reviewed)   Labs    CBC Recent  Labs    05/25/21 0431 05/26/21 0410  WBC 16.3* 13.4*  HGB 9.9* 10.3*  HCT 33.0* 33.0*  MCV 102.8* 99.7  PLT 187 638   Basic Metabolic Panel Recent Labs    05/25/21 0431 05/25/21 1604 05/26/21 0410 05/26/21 1546  NA 141   < > 137 137  K 5.4*   < > 4.5 4.8  CL 110   < > 105 105  CO2 16*   < > 22 23  GLUCOSE 192*   < > 226* 193*  BUN 138*   < > 87* 64*  CREATININE 2.65*   < > 1.56* 1.33*  CALCIUM 8.3*   < > 8.2* 8.2*  MG 2.8*  --  2.6*  --   PHOS  --    < > 5.0* 4.6   < > = values in this interval not displayed.   Liver Function Tests Recent Labs    05/26/21 0410 05/26/21 1546  ALBUMIN 2.0* 2.0*   No results for input(s): LIPASE, AMYLASE in the last 72 hours. Cardiac Enzymes No results for input(s): CKTOTAL, CKMB, CKMBINDEX, TROPONINI in the last 72 hours.  BNP: BNP (last 3 results) Recent Labs    04/20/2021 2322 05/14/21 0500 05/16/21 0340  BNP 281.2* 216.4* 78.5    ProBNP (last 3 results) No results for input(s): PROBNP in the last 8760 hours.  D-Dimer No results for input(s): DDIMER in the last 72 hours. Hemoglobin A1C No results for input(s): HGBA1C in the last 72 hours. Fasting Lipid Panel No results for input(s): CHOL, HDL, LDLCALC, TRIG, CHOLHDL, LDLDIRECT in the last 72 hours. Thyroid Function Tests No results for input(s): TSH, T4TOTAL, T3FREE, THYROIDAB in the last 72 hours.  Invalid input(s): FREET3  Other results:   Imaging    No results found.   Medications:     Scheduled Medications:  arformoterol  15 mcg Nebulization BID   aspirin  81 mg Per Tube Daily   atorvastatin  80 mg Per Tube Daily   budesonide (PULMICORT) nebulizer solution  0.5 mg Nebulization BID   chlorhexidine gluconate (MEDLINE KIT)  15 mL Mouth Rinse BID   Chlorhexidine Gluconate Cloth  6 each Topical Q0600   clonazePAM  1 mg Per Tube BID   docusate  100 mg Per Tube BID   feeding supplement (PROSource TF)  90 mL Per Tube TID   levothyroxine  50 mcg Per  Tube Q0600   mouth rinse  15 mL Mouth Rinse 10 times per day   midodrine  10 mg Per Tube TID   pantoprazole sodium  40 mg Per Tube Daily   polyethylene glycol  17 g Per Tube Daily   revefenacin  175 mcg Nebulization Daily   senna  1 tablet Per Tube Daily   sodium chloride flush  10-40 mL Intracatheter Q12H    Infusions:   prismasol BGK 4/2.5 500 mL/hr at 05/27/21 0309    prismasol BGK 4/2.5 200 mL/hr at 05/26/21 1703   sodium chloride Stopped (05/27/21 0251)   sodium chloride     amiodarone 30 mg/hr (05/27/21 0500)   dexmedetomidine (PRECEDEX) IV infusion 0.9 mcg/kg/hr (05/27/21 0500)   feeding supplement (VITAL 1.5 CAL) 55 mL/hr at 05/26/21 2000   heparin 1,800 Units/hr (05/27/21 0500)   insulin 6 Units/hr (05/27/21 0500)   meropenem (MERREM) IV Stopped (05/27/21 0236)   norepinephrine (LEVOPHED) Adult infusion 6 mcg/min (05/27/21 0500)   prismasol BGK 4/2.5 2,000 mL/hr at 05/27/21 0313    PRN Medications: [CANCELED] Place/Maintain arterial line **AND** sodium chloride, acetaminophen (TYLENOL) oral liquid 160 mg/5 mL, dextrose, fentaNYL (SUBLIMAZE) injection, heparin, metoprolol tartrate, mineral oil-hydrophilic petrolatum, sodium chloride, sodium chloride flush  Assessment/Plan    Shock: Suspect combination of septic shock and cardiogenic shock in setting of recurrent AF.  Echo 05/12/21 EF  20-25% (previous echo EF 35-40%). Bcx NGTD.  Resp cx - rare GPC.  Atrial flutter/RVR likely played a role. S/p DCCV to NSR 6/13 (TEE with EF 20-25%, mild RV dysfunction). Suspect significant vasodilatory shock component. Covering w/ meropenem. On NE 6. Co-ox 74%.  - Continue to wean NE as able.   - Continue abx  2. 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 Echo 05/12/21 EF 20-25% (previous Echo EF 35-40%).  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 Last cath 1/19 (LAD is patent, other vessels  occluded with collaterals from the LAD; SVG to OM and SVG to PDA chronically occluded).  He had progressive renal failure, now on CVVH.  Currently pulling 100-200 cc/hr with weight down 6 lbs today. Co-ox 74% on NE 6. CVP 9.   - Would back off on CVVH with lower CVP, aim 50-100 cc/hr net UF today.   - Wean off NE as able today.  - Can consider repeat cath in future, if renal  function improves, but doubt ischemia is driving factor 3. Recurrent AF/AFL with RVR: H/o DCCV in 12/21. S/p DCCV 6/13. Maintaining NSR.  - Continue amiodarone gtt, 30 mg/hr - Continue heparin gtt. 4. Acute hypercapenic/hypoxic respiratory failure: H/o COPD. Remains on vent, failed SBT.  Need to try to pull off fluid via CVVH and get him extubated.  Would be poor candidate for trach given his underlying problems.  - Wean sedation today to see if he will wake up/follow commands.  - CXR today.  - CCM managing, aim for more fluid off to get extubated.  5. AKI on CKD IV: Baseline creatinine 1.3-1.7.  With AKI and volume overload, CVVH begun.  - Aim for 50-100 cc/hr net UF via CVVH as above.  Would not be a good long-term HD candidate.  6. CAD: h/o CABG with occluded grafts.  Last cath 1/19 (LAD is patent, other vessels occluded with collaterals from the LAD; SVG to OM and SVG to PDA chronically occluded) - continue ASA/statin - Can consider repeat cath in future but doubt ischemia is driving factor 7. Thrombocytopenia: Resolved. - HIT negative 8. DM2 - per CCM  9. Urinary retention/hematuria: Foley had to be placed by Urology.  Suspect local Foley trauma.  10. COPD: Moderate to severe.  11. 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  12. Lung cancer: Had radiation.  13.  PAD: Occluded left SFA and significant right SFA stenosis on last CTA.  ABIs in 3/20 with significant disease bilaterally but stable.  14. ID: As above, concerned for septic shock component. Staph epi blood culture 1/4, likely contaminant.  - Currently on meropenem.  15.  Neuro: Per nursing, will open eyes but not following commands.   - Wean off propofol this morning to follow response.  - Consider head CT, discuss with CCM.   Poor long-term prognosis with h/o advanced CHF and COPD as well as lung cancer.  Would not be a good candidate for trach or long-term HD and family would not want.    CRITICAL CARE Performed by: [name]  Total critical care time: 40 minutes  Critical care time was exclusive of separately billable procedures and treating other patients.  Critical care was necessary to treat or prevent imminent or life-threatening deterioration.  Critical care was time spent personally by me on the following activities: development of treatment plan with patient and/or surrogate as well as nursing, discussions with consultants, evaluation of patient's response to treatment, examination of patient, obtaining history from patient or surrogate, ordering and performing treatments and interventions, ordering and review of laboratory studies, ordering and review of radiographic studies, pulse oximetry and re-evaluation of patient's condition.  [name] 05/27/2021 5:29 AM

## 2021-05-27 NOTE — Progress Notes (Signed)
[place] for  heparin  Indication: afib  Labs: Recent Labs    05/25/21 0431 05/25/21 1604 05/26/21 0410 05/26/21 1546 05/26/21 1702 05/27/21 0442 05/27/21 0623 05/27/21 1542 05/27/21 1600  HGB 9.9*  --  10.3*  --   --   --  10.2*  --   --   HCT 33.0*  --  33.0*  --   --   --  32.1*  --   --   PLT 187  --  177  --   --   --  147*  --   --   HEPARINUNFRC 0.32  --  0.13*  --  0.28* 0.24*  --  0.36  --   CREATININE 2.65*   < > 1.56* 1.33*  --  0.98  --   --  1.03   < > = values in this interval not displayed.    Assessment: 75 yo male with h/o Afib, Xarelto on hold, new thrombocytopenia on heparin and concern for HIT so was switched to argatroban. Heparin antibody was negative, so switched back to heparin on 05/14/21.  Heparin level 0.36 therapeutic on 1950 units/hr. Hgb 10.2, plt 147.  Blood around rectal tube earlier today - tube removed no more bloody BM noted  CRRT issues with clotting yesterday - none since.    Goal of Therapy:  Heparin level 0.3-0.7 units/ml Monitor platelets by anticoagulation protocol: Yes   Plan:  Continue  IV heparin 1950 units/hr. Daily heparin level and CBC F/u restart of DOAC when able    Bonnita Nasuti Pharm.D. CPP, BCPS Clinical Pharmacist [phone] 05/27/2021 8:29 PM   Please check AMION for all [place] phone numbers After 10:00 PM, call [place] [phone]

## 2021-05-27 NOTE — Progress Notes (Signed)
[facility] Progress Note    Assessment/ Plan:    AKI on CKD 3b: Cr baseline 1.5-1.6 and now up to 2.65 with massive volume overload and increasing azotemia to 138 and K 5.4.  Discussed situation with his daughter.  Recommend CRRT for correction of electrolyte and volume abnormalities.  Plan as follows: - started CRRT 05/25/21 for correction of the above -discussed with pt's dtr that time- limited trial is most appropriate--> we will trial 3-4 days and then reassess to see if CRRT and volume removal has allowed pt's mentation/ respiratory status to improve - all 4K bath, no heparin (on systemic gtt); continue UF goal 100-200 mL/ net neg today--> if we are going to give him the best chance of liberating from vent would like to be aggressive with UF for another 24 hrs   2.  Acute on chronic systolic CHF exac: - massively volume overloaded -  as above   3.  Shock: likely septic, likely PNA - per primary - vanc/ meropenem - on levophed   4.  Afib/ flutter: - s/p DCCV 05/23/21 - on hep gtt   5.  DM II: - on insulin gtt   6.  Hyperkalemia: - will improve wth CRRT   7.  Dispo: in ICU  Subjective:    On PS trial this AM.  Net neg 3L, making good urine and having good UF with CRRT.   Objective:   BP (!) 142/46   Pulse 70   Temp 98.9 F (37.2 C) (Axillary)   Resp (!) 23   Ht 5' 7"  (1.702 m)   Wt 131.7 kg   SpO2 99%   BMI 45.47 kg/m   Intake/Output Summary (Last 24 hours) at 05/27/2021 2706 Last data filed at 05/27/2021 0800 Gross per 24 hour  Intake 4068.16 ml  Output 7097 ml  Net -3028.84 ml   Weight change: -2.7 kg  Physical Exam: GEN: NAD,lying in bed HEENT eyes closed NECK + JVD PULM coarse rhonchi throughout anteriorly CV RRR ABD soft, mildly distended with abd wall edema EXT 2+ anasarca with areas of weeping NEURO intubated and sedated ACCESS: R IJ nontunneled HD cath  Imaging: DG CHEST PORT 1 VIEW  Addendum Date: 05/27/2021   ADDENDUM  REPORT: 05/27/2021 08:03 ADDENDUM: It may be prudent to consider withdrawing endotracheal tube approximately 3 cm. Electronically Signed   By: Lowella Grip III M.D.   On: 05/27/2021 08:03   Result Date: 05/27/2021 CLINICAL DATA:  Hypoxia EXAM: PORTABLE CHEST 1 VIEW COMPARISON:  May 25, 2021. FINDINGS: Endotracheal tube tip is at the carina. Peripherally inserted central catheter tip in superior vena cava. Right jugular catheter tip in superior vena cava. Pacemaker leads attached to right atrium, right ventricle, and coronary sinus. Nasogastric tube tip and side port below diaphragm. No pneumothorax. There is patchy airspace opacity in the left base with equivocal left pleural effusion. Slight right base atelectasis. There is also a right upper lobe atelectasis. Heart is mildly enlarged with pulmonary vascularity normal. No adenopathy. No bone lesions. There is aortic atherosclerosis. IMPRESSION: Tube and catheter positions as described without pneumothorax. Patchy airspace opacity left base, likely representing combination of atelectasis and pneumonia with small left pleural effusion. Areas of atelectasis in the right base and right upper lobe regions. Stable cardiac prominence. Aortic Atherosclerosis (ICD10-I70.0). Electronically Signed: By: Lowella Grip III M.D. On: 05/27/2021 07:59   DG Chest Port 1 View  Result Date: 05/25/2021 CLINICAL DATA:  Central line placement for dialysis EXAM:  PORTABLE CHEST 1 VIEW COMPARISON:  05/24/2021 FINDINGS: Right jugular dual lumen catheter tip in the innominate vein, just above the SVC. No pneumothorax Right arm PICC tip in the proximal SVC. Endotracheal tube in good position. NG tube in place. AICD unchanged in position Cardiac enlargement without pulmonary edema. Postsurgical changes right upper lobe with surrounding soft tissue density unchanged. Mild bibasilar airspace disease, unchanged. IMPRESSION: Right jugular dual lumen catheter in the lower innominate  vein on the right. No pneumothorax Persistent bibasilar airspace disease unchanged. Electronically Signed   By: Franchot Gallo M.D.   On: 05/25/2021 14:52    Labs: BMET Recent Labs  Lab 05/24/21 1348 05/24/21 2124 05/25/21 0431 05/25/21 1604 05/26/21 0410 05/26/21 1546 05/27/21 0442  NA 139 140 141 141 137 137 137  K 5.1 5.3* 5.4* 5.1 4.5 4.8 4.2  CL 110 110 110 111 105 105 102  CO2 18* 17* 16* 18* 22 23 25   GLUCOSE 198* 177* 192* 211* 226* 193* 199*  BUN 117* 130* 138* 132* 87* 64* 45*  CREATININE 2.08* 2.48* 2.65* 2.33* 1.56* 1.33* 0.98  CALCIUM 8.3* 8.3* 8.3* 8.1* 8.2* 8.2* 8.0*  PHOS  --   --   --  7.3* 5.0* 4.6 3.3   CBC Recent Labs  Lab 05/24/21 0433 05/25/21 0431 05/26/21 0410 05/27/21 0623  WBC 14.7* 16.3* 13.4* 14.3*  HGB 9.7* 9.9* 10.3* 10.2*  HCT 32.8* 33.0* 33.0* 32.1*  MCV 104.5* 102.8* 99.7 99.7  PLT 193 187 177 147*    Medications:     arformoterol  15 mcg Nebulization BID   aspirin  81 mg Per Tube Daily   atorvastatin  80 mg Per Tube Daily   budesonide (PULMICORT) nebulizer solution  0.5 mg Nebulization BID   chlorhexidine gluconate (MEDLINE KIT)  15 mL Mouth Rinse BID   Chlorhexidine Gluconate Cloth  6 each Topical Q0600   clonazePAM  0.5 mg Per Tube BID   docusate  100 mg Per Tube BID   feeding supplement (PROSource TF)  90 mL Per Tube TID   levothyroxine  50 mcg Per Tube Q0600   mouth rinse  15 mL Mouth Rinse 10 times per day   midodrine  10 mg Per Tube TID   pantoprazole sodium  40 mg Per Tube Daily   polyethylene glycol  17 g Per Tube Daily   revefenacin  175 mcg Nebulization Daily   senna  1 tablet Per Tube Daily   sodium chloride flush  10-40 mL Intracatheter Q12H      Madelon Lips, MD 05/27/2021, 9:28 AM

## 2021-05-27 NOTE — Progress Notes (Signed)
Discussed earlier c-xray results and report w/ Marni Griffon, NP.  Per [place], ETT placement okay, leave ETT at current placement.

## 2021-05-27 NOTE — Progress Notes (Signed)
[place] for  heparin  Indication: afib  Labs: Recent Labs    05/25/21 0431 05/25/21 1604 05/26/21 0410 05/26/21 1546 05/26/21 1702 05/27/21 0442 05/27/21 0623  HGB 9.9*  --  10.3*  --   --   --  10.2*  HCT 33.0*  --  33.0*  --   --   --  32.1*  PLT 187  --  177  --   --   --  147*  HEPARINUNFRC 0.32  --  0.13*  --  0.28* 0.24*  --   CREATININE 2.65*   < > 1.56* 1.33*  --  0.98  --    < > = values in this interval not displayed.    Assessment: 75 yo male with h/o Afib, Xarelto on hold, new thrombocytopenia on heparin and concern for HIT so was switched to argatroban. Heparin antibody was negative, so switched back to heparin on 05/14/21.  Heparin level came back subtherapeutic at 0.24, on 1800 units/hr. Hgb 10.2, plt 147. No s/sx of bleeding or infusion issues.  CRRT issues with clotting yesterday - none since.    Goal of Therapy:  Heparin level 0.3-0.7 units/ml Monitor platelets by anticoagulation protocol: Yes   Plan:  Increase IV heparin to 1950 units/hr. Recheck in ~8 hours  Daily heparin level and CBC F/u restart of DOAC when able  Thanks for allowing pharmacy to be a part of this patient's care.  Antonietta Jewel, PharmD, [place] Clinical Pharmacist  Phone: [phone] 05/27/2021 8:33 AM  Please check AMION for all [place] phone numbers After 10:00 PM, call [place] [phone]

## 2021-05-27 NOTE — Progress Notes (Signed)
Notified of BRB per rectum from around flexi-seal This has been in place X 7 days for stool management. He is on heparin but I also worry about localized injury to the rectum.  Plan Dc flexiseal and change to rectal pouch Ck heparin level  Ck cbc Cont heparin for now but may need to hold.  On PPI will inc to bid   Erick Colace ACNP-BC [facility] Pager # [phone] OR # [phone] if no answer

## 2021-05-28 ENCOUNTER — Inpatient Hospital Stay (HOSPITAL_COMMUNITY): Payer: Medicare Other

## 2021-05-28 DIAGNOSIS — J9600 Acute respiratory failure, unspecified whether with hypoxia or hypercapnia: Secondary | ICD-10-CM | POA: Diagnosis not present

## 2021-05-28 DIAGNOSIS — R579 Shock, unspecified: Secondary | ICD-10-CM | POA: Diagnosis not present

## 2021-05-28 DIAGNOSIS — N179 Acute kidney failure, unspecified: Secondary | ICD-10-CM | POA: Diagnosis not present

## 2021-05-28 LAB — MAGNESIUM: Magnesium: 2.4 mg/dL (ref 1.7–2.4)

## 2021-05-28 LAB — RENAL FUNCTION PANEL
Albumin: 1.8 g/dL — ABNORMAL LOW (ref 3.5–5.0)
Albumin: 1.9 g/dL — ABNORMAL LOW (ref 3.5–5.0)
Anion gap: 8 (ref 5–15)
Anion gap: 7 (ref 5–15)
BUN: 35 mg/dL — ABNORMAL HIGH (ref 8–23)
BUN: 40 mg/dL — ABNORMAL HIGH (ref 8–23)
CO2: 25 mmol/L (ref 22–32)
CO2: 26 mmol/L (ref 22–32)
Calcium: 7.5 mg/dL — ABNORMAL LOW (ref 8.9–10.3)
Calcium: 7.8 mg/dL — ABNORMAL LOW (ref 8.9–10.3)
Chloride: 104 mmol/L (ref 98–111)
Chloride: 102 mmol/L (ref 98–111)
Creatinine, Ser: 0.83 mg/dL (ref 0.61–1.24)
Creatinine, Ser: 0.95 mg/dL (ref 0.61–1.24)
GFR, Estimated: >60 mL/min (ref >60)
GFR, Estimated: >60 mL/min (ref >60)
Glucose, Bld: 187 mg/dL — ABNORMAL HIGH (ref 70–99)
Glucose, Bld: 241 mg/dL — ABNORMAL HIGH (ref 70–99)
Phosphorus: 3 mg/dL (ref 2.5–4.6)
Phosphorus: 3.9 mg/dL (ref 2.5–4.6)
Potassium: 4.3 mmol/L (ref 3.5–5.1)
Potassium: 4.8 mmol/L (ref 3.5–5.1)
Sodium: 137 mmol/L (ref 135–145)
Sodium: 135 mmol/L (ref 135–145)

## 2021-05-28 LAB — GLUCOSE, CAPILLARY
Glucose-Capillary: 106 mg/dL — ABNORMAL HIGH (ref 70–99)
Glucose-Capillary: 114 mg/dL — ABNORMAL HIGH (ref 70–99)
Glucose-Capillary: 122 mg/dL — ABNORMAL HIGH (ref 70–99)
Glucose-Capillary: 126 mg/dL — ABNORMAL HIGH (ref 70–99)
Glucose-Capillary: 129 mg/dL — ABNORMAL HIGH (ref 70–99)
Glucose-Capillary: 129 mg/dL — ABNORMAL HIGH (ref 70–99)
Glucose-Capillary: 133 mg/dL — ABNORMAL HIGH (ref 70–99)
Glucose-Capillary: 149 mg/dL — ABNORMAL HIGH (ref 70–99)
Glucose-Capillary: 150 mg/dL — ABNORMAL HIGH (ref 70–99)
Glucose-Capillary: 153 mg/dL — ABNORMAL HIGH (ref 70–99)
Glucose-Capillary: 155 mg/dL — ABNORMAL HIGH (ref 70–99)
Glucose-Capillary: 158 mg/dL — ABNORMAL HIGH (ref 70–99)
Glucose-Capillary: 163 mg/dL — ABNORMAL HIGH (ref 70–99)
Glucose-Capillary: 164 mg/dL — ABNORMAL HIGH (ref 70–99)
Glucose-Capillary: 166 mg/dL — ABNORMAL HIGH (ref 70–99)
Glucose-Capillary: 170 mg/dL — ABNORMAL HIGH (ref 70–99)
Glucose-Capillary: 179 mg/dL — ABNORMAL HIGH (ref 70–99)
Glucose-Capillary: 181 mg/dL — ABNORMAL HIGH (ref 70–99)
Glucose-Capillary: 189 mg/dL — ABNORMAL HIGH (ref 70–99)
Glucose-Capillary: 190 mg/dL — ABNORMAL HIGH (ref 70–99)
Glucose-Capillary: 200 mg/dL — ABNORMAL HIGH (ref 70–99)
Glucose-Capillary: 221 mg/dL — ABNORMAL HIGH (ref 70–99)

## 2021-05-28 LAB — CBC
HCT: 30.3 % — ABNORMAL LOW (ref 39.0–52.0)
Hemoglobin: 9.3 g/dL — ABNORMAL LOW (ref 13.0–17.0)
MCH: 31 pg (ref 26.0–34.0)
MCHC: 30.7 g/dL (ref 30.0–36.0)
MCV: 101 fL — ABNORMAL HIGH (ref 80.0–100.0)
Platelets: 164 10*3/uL (ref 150–400)
RBC: 3 MIL/uL — ABNORMAL LOW (ref 4.22–5.81)
RDW: 15 % (ref 11.5–15.5)
WBC: 14.9 10*3/uL — ABNORMAL HIGH (ref 4.0–10.5)
nRBC: 0 % (ref 0.0–0.2)

## 2021-05-28 LAB — COOXEMETRY PANEL
Carboxyhemoglobin: 0.8 % (ref 0.5–1.5)
Methemoglobin: 0.9 % (ref 0.0–1.5)
O2 Saturation: 79.1 %
Total hemoglobin: 10.7 g/dL — ABNORMAL LOW (ref 12.0–16.0)

## 2021-05-28 LAB — HEPARIN LEVEL (UNFRACTIONATED): Heparin Unfractionated: 0.23 IU/mL — ABNORMAL LOW (ref 0.30–0.70)

## 2021-05-28 MED ORDER — MIDODRINE HCL 5 MG PO TABS
20.0000 mg | ORAL_TABLET | Freq: Three times a day (TID) | ORAL | Status: DC
  Administered 2021-05-28 – 2021-06-01 (×12): 20 mg
  Filled 2021-05-28 (×12): qty 4

## 2021-05-28 MED ORDER — DOCUSATE SODIUM 50 MG/5ML PO LIQD: 100.0000 mg | Freq: Two times a day (BID) | ORAL | Status: DC | PRN

## 2021-05-28 MED ORDER — SENNA 8.6 MG PO TABS: 1.0000 | ORAL_TABLET | Freq: Every day | ORAL | Status: DC | PRN

## 2021-05-28 NOTE — Progress Notes (Signed)
[facility] Progress Note    Assessment/ Plan:    AKI on CKD 3b: Cr baseline 1.5-1.6 and now up to 2.65 with massive volume overload and increasing azotemia to 138 and K 5.4.  Discussed situation with his daughter.  Recommend CRRT for correction of electrolyte and volume abnormalities.  Plan as follows: - started CRRT 05/25/21 for correction of the above -discussed with pt's dtr that time- limited trial is most appropriate--> we will trial 3-4 days and then reassess to see if CRRT and volume removal has allowed pt's mentation/ respiratory status to improve - all 4K bath, no heparin (on systemic gtt); continue UF goal 100-200 mL/ net neg today--> if we are going to give him the best chance of liberating from vent would like to be aggressive with UF until extubation   2.  Acute on chronic systolic CHF exac: - massively volume overloaded -  as above   3.  Shock: likely septic, likely PNA - per primary - vanc/ meropenem - on levophed   4.  Afib/ flutter: - s/p DCCV 05/23/21 - on hep gtt   5.  DM II: - on insulin gtt   6.  Hyperkalemia: - will improve wth CRRT  7.  Acute hypoxic RF: - vented - tolerated PS yesterday - aggressive volume removal to hopefully extubated Monday per PCCM   8.  Dispo: in ICU  Subjective:    Doing well with CRRT.  O2 requirement coming down, more awake this AM   Objective:   BP 101/61   Pulse 69   Temp 98 F (36.7 C) (Axillary)   Resp 20   Ht 5' 7"  (1.702 m)   Wt 128.3 kg   SpO2 100%   BMI 44.30 kg/m   Intake/Output Summary (Last 24 hours) at 05/28/2021 1002 Last data filed at 05/28/2021 0800 Gross per 24 hour  Intake 3809.29 ml  Output 5646 ml  Net -1836.71 ml   Weight change: -3.4 kg  Physical Exam: GEN: NAD,lying in bed awake HEENT sclera anicteric NECK + JVD PULM coarse rhonchi throughout anteriorly CV RRR ABD soft, mildly distended with abd wall edema EXT 2+ anasarca with areas of weeping, slightly  improved NEURO intubated and sedated ACCESS: R IJ nontunneled HD cath  Imaging: DG Chest Port 1 View  Result Date: 05/28/2021 CLINICAL DATA:  Respiratory failure. EXAM: PORTABLE CHEST 1 VIEW COMPARISON:  05/27/2021 and CT chest 12/22/2020 FINDINGS: Endotracheal tube is roughly 4.5 cm above the carina. Nasogastric tube extends into the abdomen. 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 Persistent patchy densities throughout the right lung. Persistent consolidation and opacities at the left lung base. Focal opacity in the left apical region is compatible with known disease/changes from previous CT. Right jugular dialysis catheter with the tip in the upper SVC region. Negative for pneumothorax. Right arm PICC line and difficult to see the PICC line tip. IMPRESSION: Low lung volumes with patchy bilateral lung densities particularly at the left lung base. Cannot exclude left pleural fluid. Chest findings have minimally changed since 05/27/2021. Support apparatuses as described. Electronically Signed   By: Markus Daft M.D.   On: 05/28/2021 09:52   DG CHEST PORT 1 VIEW  Addendum Date: 05/27/2021   ADDENDUM REPORT: 05/27/2021 08:03 ADDENDUM: It may be prudent to consider withdrawing endotracheal tube approximately 3 cm. Electronically Signed   By: Lowella Grip III M.D.   On: 05/27/2021 08:03   Result Date: 05/27/2021 CLINICAL DATA:  Hypoxia EXAM: PORTABLE CHEST 1 VIEW  COMPARISON:  May 25, 2021. FINDINGS: Endotracheal tube tip is at the carina. Peripherally inserted central catheter tip in superior vena cava. Right jugular catheter tip in superior vena cava. Pacemaker leads attached to right atrium, right ventricle, and coronary sinus. Nasogastric tube tip and side port below diaphragm. No pneumothorax. There is patchy airspace opacity in the left base with equivocal left pleural effusion. Slight right base atelectasis. There is also a right upper lobe atelectasis. Heart is mildly enlarged with pulmonary vascularity  normal. No adenopathy. No bone lesions. There is aortic atherosclerosis. IMPRESSION: Tube and catheter positions as described without pneumothorax. Patchy airspace opacity left base, likely representing combination of atelectasis and pneumonia with small left pleural effusion. Areas of atelectasis in the right base and right upper lobe regions. Stable cardiac prominence. Aortic Atherosclerosis (ICD10-I70.0). Electronically Signed: By: Lowella Grip III M.D. On: 05/27/2021 07:59    Labs: BMET Recent Labs  Lab 05/25/21 0431 05/25/21 1604 05/26/21 0410 05/26/21 1546 05/27/21 0442 05/27/21 1600 05/28/21 0415  NA 141 141 137 137 137 136 137  K 5.4* 5.1 4.5 4.8 4.2 4.5 4.3  CL 110 111 105 105 102 105 104  CO2 16* 18* 22 23 25 25 25   GLUCOSE 192* 211* 226* 193* 199* 191* 187*  BUN 138* 132* 87* 64* 45* 42* 35*  CREATININE 2.65* 2.33* 1.56* 1.33* 0.98 1.03 0.83  CALCIUM 8.3* 8.1* 8.2* 8.2* 8.0* 7.7* 7.5*  PHOS  --  7.3* 5.0* 4.6 3.3 3.5 3.0   CBC Recent Labs  Lab 05/25/21 0431 05/26/21 0410 05/27/21 0623 05/28/21 0415  WBC 16.3* 13.4* 14.3* 14.9*  HGB 9.9* 10.3* 10.2* 9.3*  HCT 33.0* 33.0* 32.1* 30.3*  MCV 102.8* 99.7 99.7 101.0*  PLT 187 177 147* 164    Medications:     arformoterol  15 mcg Nebulization BID   aspirin  81 mg Per Tube Daily   atorvastatin  80 mg Per Tube Daily   budesonide (PULMICORT) nebulizer solution  0.5 mg Nebulization BID   chlorhexidine gluconate (MEDLINE KIT)  15 mL Mouth Rinse BID   Chlorhexidine Gluconate Cloth  6 each Topical Q0600   clonazePAM  0.5 mg Per Tube BID   feeding supplement (PROSource TF)  90 mL Per Tube TID   levothyroxine  50 mcg Per Tube Q0600   mouth rinse  15 mL Mouth Rinse 10 times per day   midodrine  20 mg Per Tube TID   pantoprazole sodium  40 mg Per Tube BID   revefenacin  175 mcg Nebulization Daily   sodium chloride flush  10-40 mL Intracatheter Q12H      Madelon Lips, MD 05/28/2021, 10:02 AM

## 2021-05-28 NOTE — Progress Notes (Signed)
[place] for  heparin  Indication: afib  Labs: Recent Labs    05/26/21 0410 05/26/21 1546 05/27/21 0442 05/27/21 0623 05/27/21 1542 05/27/21 1600 05/27/21 2022 05/28/21 0415  HGB 10.3*  --   --  10.2*  --   --   --  9.3*  HCT 33.0*  --   --  32.1*  --   --   --  30.3*  PLT 177  --   --  147*  --   --   --  164  HEPARINUNFRC 0.13*   < > 0.24*  --  0.36  --  0.46 0.23*  CREATININE 1.56*   < > 0.98  --   --  1.03  --  0.83   < > = values in this interval not displayed.    Assessment: 75 yo male with h/o Afib, Xarelto on hold, new thrombocytopenia on heparin and concern for HIT so was switched to argatroban. Heparin antibody was negative, so switched back to heparin on 05/14/21.  Heparin level came back subtherapeutic at 0.23, CBC stable. Reported BRBPR 6/17 but resolved.   Goal of Therapy:  Heparin level 0.3-0.7 units/ml Monitor platelets by anticoagulation protocol: Yes   Plan:  Increase IV heparin to 2050 units/h Daily heparin level and CBC   [name], PharmD, [place], [facility] Clinical Pharmacist [phone] Please check AMION for all [facility] numbers 05/28/2021

## 2021-05-28 NOTE — Progress Notes (Signed)
NAME:  [name], MRN:  [id number], DOB:  1946-03-17, LOS: 58 ADMISSION DATE:  04/29/2021, CONSULTATION DATE:  05/09/2021 REFERRING MD:  Dr. Randal Buba, CHIEF COMPLAINT:  SOB  History of Present Illness:  [name] is a 75 yo M with past medical hx as below presenting from home with complaints of SOB/ respiratory distress on 5/29.  Treated by EMS with duonebs, epi, solumedrol, and mag without improvement, requiring BVM.  On arrival to ER, patient required intubation as he was peri- arrest, unresponsive, bradycardic, temp 95.9, hypotensive, and apneic with diffuse apical wheeze requiring emergent intubation on arrival.  Noted to have pink frothy secretions on intubation.  PCCM called for admit.   Significant Hospital Events: Including procedures, antibiotic start and stop dates in addition to other pertinent events   5/30 admitted for respiratory failure/ intubated, empiric CAP coverage/ duresis 6/1 BLLE Duplex> no DVT, unable to complete thoracentesis due to anatomy  6/2 Tmax 101, -2.9L in last 24 hours, on levo at 64mcg, precedex. Resp culture negative. BC pending. 6/3 switched from heparin to argatroban for thrombocytopenia, HIT ab pending 6/4 HIT AB negative, switched back to heparin, failed SBT due to tachypnea 6/7 afib w/ RVR, HR increased abruptly to 140s from 90s, received amio bolus and started on drip, got fentanyl and versed, started cefepime and vanco, d/c norepi, receiving vasopressin, precedex, phenylephrine 6/9 - Stopped cefepime and started meropenem, continued vanco due to concern for cefepime- associated drug rash 6/12 unable to extubate. Afib RVR worse 6/13: on mech vent; Levo at 2; 60 Amio; Afib RVR persist; TEE and cardioverted 150 J into sinus rhythm and HR in 80s 6/14: Levo increased to 20; WBC and fever trending up; cultures sent; started back on meropenem and vanc 6/15: creatine function worsening; nephrology consulted, started on CRRT 6/16: Net UF goal increased.   Tolerated well.  Sputum came back as normal flora stop date placed for antibiotics.  Tolerated just over 2 hours of pressure support ventilation 6/17 continuing to pull fluid but decreased UF goal.  Decreasing RASS goal to 0 and attempt to more aggressively wean.  Decreased clonazepam to 0.5 mg twice a day from 1 mg twice a day, decreased as needed fentanyl dosing.   Interim History / Subjective:  Patient is started bleeding around rectal tube, it was discontinued yesterday likely due to local irritation, H&H remained stable Remain on CRRT, weight is down 6 pounds since yesterday.  Continue to require vasopressors with Levophed  Objective   Blood pressure (!) 121/55, pulse 70, temperature 98 F (36.7 C), temperature source Axillary, resp. rate (!) 24, height 5\' 7"  (1.702 m), weight 128.3 kg, SpO2 100 %. CVP:  [2 mmHg-36 mmHg] 2 mmHg  Vent Mode: PRVC FiO2 (%):  [25 %-40 %] 25 % Set Rate:  [1 bmp-24 bmp] 1 bmp Vt Set:  [530 mL] 530 mL PEEP:  [5 cmH20] 5 cmH20 Plateau Pressure:  [14 cmH20-26 cmH20] 16 cmH20   Intake/Output Summary (Last 24 hours) at 05/28/2021 0759 Last data filed at 05/28/2021 0700 Gross per 24 hour  Intake 4300.89 ml  Output 6101 ml  Net -1800.11 ml    Filed Weights   05/26/21 0431 05/27/21 0126 05/28/21 0215  Weight: 134.4 kg 131.7 kg 128.3 kg    Examination:    Physical exam: General: Crtitically ill-appearing male, orally intubated HEENT: [place]/AT, eyes anicteric.  ETT and OGT in place Neuro: Eyes open, following very simple commands like sticking out tongue, closing eyes, generalized weak Chest:  Coarse breath sounds, no wheezes or rhonchi Heart: Regular rate and rhythm, no murmurs or gallops Abdomen: Soft, nontender, nondistended, bowel sounds present Skin: No rash   Labs/imaging that I havepersonally reviewed  (right click and "Reselect all SmartList Selections" daily)   Reviewed see below   Resolved Hospital Problem list    Thrombocytopenia 4T  score= 4, intermediate risk for HIT, although B- lactams can also decrease platelets. HIT Ab negative Hypernatremia Constipation  Assessment & Plan:   Acute hypoxic/ hypercarbic respiratory failure 2/2 diffuse bilateral infiltrates (mix of PNA, acute pulmonary edema) Further c/b Hx of COPD Patient has been tolerating pressure support trial for 2 to 3 hours every day He is generalized weak with anasarca Once volume is removed via CRRT, will try to extubate him Patient's family does not want to prolong or proceed with tracheostomy if he fails Overall he has shown signs of improvement Minimize sedation with RASS goal 0 Continue Pulmicort, Brovana, and Yupelri  Continue volume removal as tolerated, net goal addressed by heart failure team  Day #5 meropenem, planning on a 7-day course  Continue VAP bundle   Mixed septic/cardiogenic shock 2/2 PNA Patient does have bilateral infiltrates, more on the left than right His blood culture grew Staph epidermis in 1 blood culture so think this is a contamination  Pressor requirements improving Continue Meropenem for 7 days stop date placed  Increase midodrine to 20 mg 3 times daily Continue to wean pressors with map goal 65 Volume status as addressed by cardiology   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  Chronic atrial fibrillation with RVR s/p cardioversion Hx of VT on amio w/ICD, CAD s/p CABG 1996, LBBB, HTN Patient remained in paced rhythm Continue anticoagulation for stroke prophylaxis Continue amiodarone infusion  Acute metabolic encephalopathy superimposed on baseline anxiety disorder Patient is waking up, mental status is improving Decreased Precedex 0.4, RASS goal 0 Continue clonazepam to 0.5 mg via tube twice daily  AKI on CKD 3b, hx BPH, phimosis on  PTA, finasteride  Obstructive uropathy  Phimosis required bedside dilation by urology for foley placement 5/30; Remove foley 6/8 due to concern for infection; replaced 6/9 by urology Continuing CRRT  Keep Foley in place, add back finasteride when able  We will hold off on removing Foley for now given need for dilation during last insertion,  Hypothyroidism, stable Plan: Synthroid  Morbid obesity Continue nutritionist follow-up  Best Practice (right click and "Reselect all SmartList Selections" daily)   Diet/type: tubefeeds Pain/Anxiety/Delirium protocol Yes and RASS goal: 0 VAP protocol (if indicated): Yes DVT prophylaxis: systemic heparin GI prophylaxis: PPI Glucose control:  insulin gtt.  Central venous access:  Yes, and it is still needed Arterial line: [name] Foley:  Yes, and it is still needed Mobility:  bed rest  PT consulted: [name] Antibiotics:meropenem Antibiotic de-escalation: no,  continue current rx Code Status:  full code Last date of multidisciplinary goals of care discussion [6/16] spoke with patient's daughter and wife at bedside, continue full aggressive care for next few days, If patient does not improve then they would like to proceed with comfort care Disposition: remains critically ill, will stay in intensive care    Total critical care time: 39 minutes  Performed by: Jacky Kindle   Critical care time was exclusive of separately billable procedures and treating other patients.   Critical  care was necessary to treat or prevent imminent or life-threatening deterioration.   Critical care was time spent personally by me on the following activities: development of treatment plan with patient and/or surrogate as well as nursing, discussions with consultants, evaluation of patient's response to treatment, examination of patient, obtaining history from patient or surrogate, ordering and performing treatments and interventions, ordering and review of laboratory  studies, ordering and review of radiographic studies, pulse oximetry and re-evaluation of patient's condition.   Jacky Kindle MD [facility] See Amion for pager If no response to pager, please call [phone] until 7pm After 7pm, Please call E-link [phone]

## 2021-05-28 NOTE — Progress Notes (Signed)
RN called d/t pt desaturating s/p being turned and bathed.  Came to room and sats appeared 84%, but questionable correlation on  monitor.  Noted increased WOB, placed pt on full vent support, previous settings, placed pt on 100% o2 breaths.  Noted rhonchi, sx pt for mod amount tan/old blood tinged/pink secretions & noted desat to 74% w/ questionable pleth reading correlation.  Ambu bagged pt on 100% and pt sats improved to 100% w/ good pleth reading/correlation.  Pt placed back on vent, full vent support, sats continued to be 100%.  RN at bedside and aware.  NO distress currently noted. ETT remains secure and 25 at lip.

## 2021-05-28 NOTE — Progress Notes (Signed)
Patient ID: [name], male   DOB: [birth year], 75 y.o.   MRN: [id number]     Advanced Heart Failure Rounding Note  PCP-Cardiologist: None   Subjective:     Underwent DCCV 6/13. Maintaining NSR.   Remains intubated on CVVHD  He is awake but not following commands for me. Wife reports he was earlier today. Remains on precedx.   He is on meropenem. NE up 6-> 10.  Amio gtt at 30/hr  + heparin. Had some BRBPR yesterday but now resolved. Co-ox 79%, CVP 6-7    TEE (6/13): EF 20-25%, mildly decreased RV systolic function   Objective:   Weight Range: 128.3 kg Body mass index is 44.3 kg/m.   Vital Signs:   Temp:  [97.5 F (36.4 C)-99 F (37.2 C)] 98 F (36.7 C) (06/18 0645) Pulse Rate:  [69-70] 69 (06/18 0715) Resp:  [19-32] 20 (06/18 0715) BP: (75-139)/(32-98) 101/61 (06/18 0715) SpO2:  [95 %-100 %] 100 % (06/18 0733) Arterial Line BP: (54-56)/(46-49) 56/49 (06/17 1215) FiO2 (%):  [25 %-40 %] 40 % (06/18 0732) Weight:  [128.3 kg] 128.3 kg (06/18 0215) Last BM Date: 05/27/21  Weight change: Filed Weights   05/26/21 0431 05/27/21 0126 05/28/21 0215  Weight: 134.4 kg 131.7 kg 128.3 kg    Intake/Output:   Intake/Output Summary (Last 24 hours) at 05/28/2021 1107 Last data filed at 05/28/2021 1000 Gross per 24 hour  Intake 3843.23 ml  Output 5431 ml  Net -1587.77 ml       Physical Exam    General:  Obese male awake on vent but not following comands HEENT: normal Neck: supple. JVP hard to see  Carotids 2+ bilat; no bruits. No lymphadenopathy or thryomegaly appreciated. Cor: PMI nondisplaced. Regular rate & rhythm. No rubs, gallops or murmurs. Lungs: coarse Abdomen: obese soft, nontender, nondistended. No hepatosplenomegaly. No bruits or masses. Good bowel sounds. Extremities: no cyanosis, clubbing, rash, 1+ edema Neuro: awake but not following commands    Telemetry    A-BiV paced 80s  (personally reviewed)   Labs    CBC Recent Labs    05/27/21 0623  05/28/21 0415  WBC 14.3* 14.9*  HGB 10.2* 9.3*  HCT 32.1* 30.3*  MCV 99.7 101.0*  PLT 147* 240    Basic Metabolic Panel Recent Labs    05/27/21 0442 05/27/21 1600 05/28/21 0415  NA 137 136 137  K 4.2 4.5 4.3  CL 102 105 104  CO2 25 25 25   GLUCOSE 199* 191* 187*  BUN 45* 42* 35*  CREATININE 0.98 1.03 0.83  CALCIUM 8.0* 7.7* 7.5*  MG 2.4  --  2.4  PHOS 3.3 3.5 3.0    Liver Function Tests Recent Labs    05/27/21 1600 05/28/21 0415  ALBUMIN 1.9* 1.8*    No results for input(s): LIPASE, AMYLASE in the last 72 hours. Cardiac Enzymes No results for input(s): CKTOTAL, CKMB, CKMBINDEX, TROPONINI in the last 72 hours.  BNP: BNP (last 3 results) Recent Labs    04/19/2021 2322 05/14/21 0500 05/16/21 0340  BNP 281.2* 216.4* 78.5     ProBNP (last 3 results) No results for input(s): PROBNP in the last 8760 hours.   D-Dimer No results for input(s): DDIMER in the last 72 hours. Hemoglobin A1C No results for input(s): HGBA1C in the last 72 hours. Fasting Lipid Panel No results for input(s): CHOL, HDL, LDLCALC, TRIG, CHOLHDL, LDLDIRECT in the last 72 hours. Thyroid Function Tests No results for input(s): TSH, T4TOTAL, T3FREE, THYROIDAB in the  last 72 hours.  Invalid input(s): FREET3  Other results:   Imaging    DG Chest Port 1 View  Result Date: 05/28/2021 CLINICAL DATA:  Respiratory failure. EXAM: PORTABLE CHEST 1 VIEW COMPARISON:  05/27/2021 and CT chest 12/22/2020 FINDINGS: Endotracheal tube is roughly 4.5 cm above the carina. Nasogastric tube extends into the abdomen. 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 Persistent patchy densities throughout the right lung. Persistent consolidation and opacities at the left lung base. Focal opacity in the left apical region is compatible with known disease/changes from previous CT. Right jugular dialysis catheter with the tip in the upper SVC region. Negative for pneumothorax. Right arm PICC line and difficult to see the PICC line  tip. IMPRESSION: Low lung volumes with patchy bilateral lung densities particularly at the left lung base. Cannot exclude left pleural fluid. Chest findings have minimally changed since 05/27/2021. Support apparatuses as described. Electronically Signed   By: Markus Daft M.D.   On: 05/28/2021 09:52     Medications:     Scheduled Medications:  arformoterol  15 mcg Nebulization BID   aspirin  81 mg Per Tube Daily   atorvastatin  80 mg Per Tube Daily   budesonide (PULMICORT) nebulizer solution  0.5 mg Nebulization BID   chlorhexidine gluconate (MEDLINE KIT)  15 mL Mouth Rinse BID   Chlorhexidine Gluconate Cloth  6 each Topical Q0600   clonazePAM  0.5 mg Per Tube BID   feeding supplement (PROSource TF)  90 mL Per Tube TID   levothyroxine  50 mcg Per Tube Q0600   mouth rinse  15 mL Mouth Rinse 10 times per day   midodrine  20 mg Per Tube TID   pantoprazole sodium  40 mg Per Tube BID   revefenacin  175 mcg Nebulization Daily   sodium chloride flush  10-40 mL Intracatheter Q12H    Infusions:   prismasol BGK 4/2.5 500 mL/hr at 05/28/21 0107    prismasol BGK 4/2.5 200 mL/hr at 05/27/21 1816   sodium chloride 10 mL/hr at 05/28/21 1000   sodium chloride     amiodarone 30 mg/hr (05/28/21 1000)   dexmedetomidine (PRECEDEX) IV infusion 0.3 mcg/kg/hr (05/28/21 1009)   feeding supplement (VITAL 1.5 CAL) 1,000 mL (05/28/21 0700)   heparin 1,950 Units/hr (05/28/21 1000)   insulin 7 Units/hr (05/28/21 1000)   meropenem (MERREM) IV 1 g (05/28/21 1006)   norepinephrine (LEVOPHED) Adult infusion 10 mcg/min (05/28/21 1000)   prismasol BGK 4/2.5 2,000 mL/hr at 05/28/21 0641    PRN Medications: [CANCELED] Place/Maintain arterial line **AND** sodium chloride, acetaminophen (TYLENOL) oral liquid 160 mg/5 mL, dextrose, docusate, fentaNYL (SUBLIMAZE) injection, heparin, metoprolol tartrate, mineral oil-hydrophilic petrolatum, polyethylene glycol, senna, sodium chloride, sodium chloride  flush  Assessment/Plan    Shock: Suspect combination of septic shock and cardiogenic shock in setting of recurrent AF.  Echo 05/12/21 EF  20-25% (previous echo EF 35-40%). Bcx NGTD.  Resp cx - rare GPC.  Atrial flutter/RVR likely played a role. S/p DCCV to NSR 6/13 (TEE with EF 20-25%, mild RV dysfunction). Suspect significant vasodilatory/septic shock component predominating. Covering w/ meropenem. On NE 6 -> 10  Co-ox 79%.  - Wean pressors as able - Continue abx  - CCM managing 2. 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 Echo 05/12/21 EF 20-25% (previous Echo EF 35-40%).  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 Last cath 1/19 (LAD is patent, other vessels occluded  with collaterals from the LAD; SVG to OM and SVG to PDA chronically occluded).  He had progressive renal failure, now on CVVH.  Volume status improved. Remains on CVVHD  - Volume removal per Renal  - Doubt ischemia is driving factor. No role for cath in setting of renal failure and poor prognosis  3. Recurrent AF/AFL with RVR: H/o DCCV in 12/21. S/p DCCV 6/13. Maintaining NSR.  - Continue amiodarone gtt, 30 mg/hr - Continue heparin gtt. Watch for recurrent BRBPR 4. Acute hypercapenic/hypoxic respiratory failure: H/o COPD. Remains on vent, failed SBT.  Need to try to pull off fluid via CVVH and get him extubated.  Would be poor candidate for trach given his underlying problems. Family does not want trach  - I am worried he will not be able to wean easily from vent.  - CCM managing. Retry vent wean as fluid comes off 5. AKI on CKD IV: Baseline creatinine 1.3-1.7.  With AKI and volume overload, CVVH begun.  - Remains on CVVHD. Volume removal per Renal Would not be a good long-term HD candidate.  6. CAD: h/o CABG with occluded grafts.  Last cath 1/19 (LAD is patent, other vessels occluded with collaterals from the LAD; SVG to OM and SVG to  PDA chronically occluded) - continue ASA/statin - Doubt ischemia is driving factor. No role for cath in setting of renal failure and poor prognosis  7. Thrombocytopenia: Resolved. - HIT negative 8. DM2 - per CCM  9. Urinary retention/hematuria: Foley had to be placed by Urology.  Suspect local Foley trauma.  10. COPD: Moderate to severe.  11. 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  12. Lung cancer: Had radiation.  13.  PAD: Occluded left SFA and significant right SFA stenosis on last CTA.  ABIs in 3/20 with significant disease bilaterally but stable.  14. ID: As above, concerned for septic shock component. Staph epi blood culture 1/4, likely contaminant.  - Currently on meropenem.  15. Neuro: will follow commands at times but not currently  - continue to follow   Poor long-term prognosis with h/o advanced CHF and COPD as well as lung cancer.  Would not be a good candidate for trach or long-term HD and family would not want. I have consulted Palliative Care.    CRITICAL CARE Performed by: [name]  Total critical care time: 35 minutes  Critical care time was exclusive of separately billable procedures and treating other patients.  Critical care was necessary to treat or prevent imminent or life-threatening deterioration.  Critical care was time spent personally by me on the following activities: development of treatment plan with patient and/or surrogate as well as nursing, discussions with consultants, evaluation of patient's response to treatment, examination of patient, obtaining history from patient or surrogate, ordering and performing treatments and interventions, ordering and review of laboratory studies, ordering and review of radiographic studies, pulse oximetry and re-evaluation of patient's condition.  Quillian Quince Blanca Carreon 05/28/2021 11:07 AM

## 2021-05-29 DIAGNOSIS — Z66 Do not resuscitate: Secondary | ICD-10-CM

## 2021-05-29 DIAGNOSIS — Z515 Encounter for palliative care: Secondary | ICD-10-CM | POA: Diagnosis not present

## 2021-05-29 DIAGNOSIS — R579 Shock, unspecified: Secondary | ICD-10-CM | POA: Diagnosis not present

## 2021-05-29 DIAGNOSIS — J9620 Acute and chronic respiratory failure, unspecified whether with hypoxia or hypercapnia: Secondary | ICD-10-CM | POA: Diagnosis not present

## 2021-05-29 DIAGNOSIS — N179 Acute kidney failure, unspecified: Secondary | ICD-10-CM | POA: Diagnosis not present

## 2021-05-29 DIAGNOSIS — R092 Respiratory arrest: Secondary | ICD-10-CM | POA: Diagnosis not present

## 2021-05-29 LAB — CBC WITH DIFFERENTIAL/PLATELET
Abs Immature Granulocytes: 0.14 10*3/uL — ABNORMAL HIGH (ref 0.00–0.07)
Basophils Absolute: 0.1 10*3/uL (ref 0.0–0.1)
Basophils Relative: 0 %
Eosinophils Absolute: 0.1 10*3/uL (ref 0.0–0.5)
Eosinophils Relative: 1 %
HCT: 29.5 % — ABNORMAL LOW (ref 39.0–52.0)
Hemoglobin: 9.2 g/dL — ABNORMAL LOW (ref 13.0–17.0)
Immature Granulocytes: 1 %
Lymphocytes Relative: 4 %
Lymphs Abs: 0.6 10*3/uL — ABNORMAL LOW (ref 0.7–4.0)
MCH: 31.8 pg (ref 26.0–34.0)
MCHC: 31.2 g/dL (ref 30.0–36.0)
MCV: 102.1 fL — ABNORMAL HIGH (ref 80.0–100.0)
Monocytes Absolute: 0.8 10*3/uL (ref 0.1–1.0)
Monocytes Relative: 6 %
Neutro Abs: 11.7 10*3/uL — ABNORMAL HIGH (ref 1.7–7.7)
Neutrophils Relative %: 88 %
Platelets: 188 10*3/uL (ref 150–400)
RBC: 2.89 MIL/uL — ABNORMAL LOW (ref 4.22–5.81)
RDW: 15.3 % (ref 11.5–15.5)
WBC: 13.4 10*3/uL — ABNORMAL HIGH (ref 4.0–10.5)
nRBC: 0 % (ref 0.0–0.2)

## 2021-05-29 LAB — COOXEMETRY PANEL
Carboxyhemoglobin: 0.9 % (ref 0.5–1.5)
Methemoglobin: 0.9 % (ref 0.0–1.5)
O2 Saturation: 68.9 %
Total hemoglobin: 9.4 g/dL — ABNORMAL LOW (ref 12.0–16.0)

## 2021-05-29 LAB — RENAL FUNCTION PANEL
Albumin: 1.8 g/dL — ABNORMAL LOW (ref 3.5–5.0)
Albumin: 1.9 g/dL — ABNORMAL LOW (ref 3.5–5.0)
Albumin: 1.9 g/dL — ABNORMAL LOW (ref 3.5–5.0)
Anion gap: 8 (ref 5–15)
Anion gap: 7 (ref 5–15)
Anion gap: 5 (ref 5–15)
BUN: 32 mg/dL — ABNORMAL HIGH (ref 8–23)
BUN: 28 mg/dL — ABNORMAL HIGH (ref 8–23)
BUN: 27 mg/dL — ABNORMAL HIGH (ref 8–23)
CO2: 26 mmol/L (ref 22–32)
CO2: 26 mmol/L (ref 22–32)
CO2: 26 mmol/L (ref 22–32)
Calcium: 7.7 mg/dL — ABNORMAL LOW (ref 8.9–10.3)
Calcium: 8.1 mg/dL — ABNORMAL LOW (ref 8.9–10.3)
Calcium: 7.8 mg/dL — ABNORMAL LOW (ref 8.9–10.3)
Chloride: 103 mmol/L (ref 98–111)
Chloride: 103 mmol/L (ref 98–111)
Chloride: 103 mmol/L (ref 98–111)
Creatinine, Ser: 0.85 mg/dL (ref 0.61–1.24)
Creatinine, Ser: 0.84 mg/dL (ref 0.61–1.24)
Creatinine, Ser: 0.83 mg/dL (ref 0.61–1.24)
GFR, Estimated: >60 mL/min (ref >60)
GFR, Estimated: >60 mL/min (ref >60)
GFR, Estimated: >60 mL/min (ref >60)
Glucose, Bld: 238 mg/dL — ABNORMAL HIGH (ref 70–99)
Glucose, Bld: 200 mg/dL — ABNORMAL HIGH (ref 70–99)
Glucose, Bld: 184 mg/dL — ABNORMAL HIGH (ref 70–99)
Phosphorus: 3.1 mg/dL (ref 2.5–4.6)
Phosphorus: 3.3 mg/dL (ref 2.5–4.6)
Phosphorus: 3.3 mg/dL (ref 2.5–4.6)
Potassium: 4.5 mmol/L (ref 3.5–5.1)
Potassium: 4.5 mmol/L (ref 3.5–5.1)
Potassium: 4.6 mmol/L (ref 3.5–5.1)
Sodium: 137 mmol/L (ref 135–145)
Sodium: 136 mmol/L (ref 135–145)
Sodium: 134 mmol/L — ABNORMAL LOW (ref 135–145)

## 2021-05-29 LAB — GLUCOSE, CAPILLARY
Glucose-Capillary: 140 mg/dL — ABNORMAL HIGH (ref 70–99)
Glucose-Capillary: 143 mg/dL — ABNORMAL HIGH (ref 70–99)
Glucose-Capillary: 145 mg/dL — ABNORMAL HIGH (ref 70–99)
Glucose-Capillary: 151 mg/dL — ABNORMAL HIGH (ref 70–99)
Glucose-Capillary: 154 mg/dL — ABNORMAL HIGH (ref 70–99)
Glucose-Capillary: 159 mg/dL — ABNORMAL HIGH (ref 70–99)
Glucose-Capillary: 164 mg/dL — ABNORMAL HIGH (ref 70–99)
Glucose-Capillary: 165 mg/dL — ABNORMAL HIGH (ref 70–99)
Glucose-Capillary: 166 mg/dL — ABNORMAL HIGH (ref 70–99)
Glucose-Capillary: 166 mg/dL — ABNORMAL HIGH (ref 70–99)
Glucose-Capillary: 167 mg/dL — ABNORMAL HIGH (ref 70–99)
Glucose-Capillary: 173 mg/dL — ABNORMAL HIGH (ref 70–99)
Glucose-Capillary: 181 mg/dL — ABNORMAL HIGH (ref 70–99)
Glucose-Capillary: 97 mg/dL (ref 70–99)

## 2021-05-29 LAB — CULTURE, BLOOD (ROUTINE X 2)
Culture: NO GROWTH
Special Requests: ADEQUATE

## 2021-05-29 LAB — HEPARIN LEVEL (UNFRACTIONATED)
Heparin Unfractionated: 0.39 IU/mL (ref 0.30–0.70)
Heparin Unfractionated: 0.42 IU/mL (ref 0.30–0.70)

## 2021-05-29 LAB — MAGNESIUM: Magnesium: 2.3 mg/dL (ref 1.7–2.4)

## 2021-05-29 NOTE — Progress Notes (Signed)
[facility] Progress Note    Assessment/ Plan:    AKI on CKD 3b: Cr baseline 1.5-1.6 and now up to 2.65 with massive volume overload and increasing azotemia to 138 and K 5.4.  Discussed situation with his daughter.  Recommend CRRT for correction of electrolyte and volume abnormalities.  Plan as follows: - started CRRT 05/25/21 for correction of the above -discussed with pt's dtr that time- limited trial is most appropriate--> we will trial 3-4 days and then reassess to see if CRRT and volume removal has allowed pt's mentation/ respiratory status to improve - all 4K bath, no heparin (on systemic gtt); continue UF goal 100-200 mL/ net neg today--> if we are going to give him the best chance of liberating from vent would like to be aggressive with UF until extubation - for possible extubation tomorrow- will d/w PCCM- further determination of RRT depending on how extubation goes   2.  Acute on chronic systolic CHF exac: - massively volume overloaded -  as above   3.  Shock:  - per primary - vanc/ meropenem - on levophed - midodrine 20 TID   4.  Afib/ flutter: - s/p DCCV 05/23/21 - on hep gtt - on amio gtt   5.  DM II: - on insulin gtt   6.  Hyperkalemia: - resolved  7.  Acute hypoxic RF: - vented - tolerated PS yesterday - aggressive volume removal to hopefully extubated Monday per PCCM   8.  Dispo: in ICU  Subjective:    On another PS trial.  No further BRBPR.     Objective:   BP (!) 110/51 (BP Location: Left Arm)   Pulse 70   Temp 98.4 F (36.9 C) (Axillary)   Resp (!) 21   Ht _0  (1.702 m)   Wt 128.3 kg   SpO2 97%   BMI 44.30 kg/m   Intake/Output Summary (Last 24 hours) at 05/29/2021 0843 Last data filed at 05/29/2021 0800 Gross per 24 hour  Intake 3464.54 ml  Output 6482 ml  Net -3017.46 ml   Weight change:   Physical Exam: GEN: NAD,lying in bed awake HEENT sclera anicteric NECK + JVD PULM coarse rhonchi throughout anteriorly, L >  R CV RRR ABD soft, mildly distended with abd wall edema EXT 2+ anasarca with areas of weeping, slightly improved NEURO intubated and sedated ACCESS: R IJ nontunneled HD cath  Imaging: DG Chest Port 1 View  Result Date: 05/28/2021 CLINICAL DATA:  Respiratory failure. EXAM: PORTABLE CHEST 1 VIEW COMPARISON:  05/27/2021 and CT chest 12/22/2020 FINDINGS: Endotracheal tube is roughly 4.5 cm above the carina. Nasogastric tube extends into the abdomen. 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 Persistent patchy densities throughout the right lung. Persistent consolidation and opacities at the left lung base. Focal opacity in the left apical region is compatible with known disease/changes from previous CT. Right jugular dialysis catheter with the tip in the upper SVC region. Negative for pneumothorax. Right arm PICC line and difficult to see the PICC line tip. IMPRESSION: Low lung volumes with patchy bilateral lung densities particularly at the left lung base. Cannot exclude left pleural fluid. Chest findings have minimally changed since 05/27/2021. Support apparatuses as described. Electronically Signed   By: Markus Daft M.D.   On: 05/28/2021 09:52    Labs: BMET Recent Labs  Lab 05/26/21 0410 05/26/21 1546 05/27/21 0442 05/27/21 1600 05/28/21 0415 05/28/21 1655 05/29/21 0253  NA 137 137 137 136 137 135 137  K 4.5  4.8 4.2 4.5 4.3 4.8 4.5  CL 105 105 102 105 104 102 103  CO2 _0 GLUCOSE 226* 193* 199* 191* 187* 241* 238*  BUN 87* 64* 45* 42* 35* 40* 32*  CREATININE 1.56* 1.33* 0.98 1.03 0.83 0.95 0.85  CALCIUM 8.2* 8.2* 8.0* 7.7* 7.5* 7.8* 7.7*  PHOS 5.0* 4.6 3.3 3.5 3.0 3.9 3.1   CBC Recent Labs  Lab 05/26/21 0410 05/27/21 0623 05/28/21 0415 05/29/21 0252  WBC 13.4* 14.3* 14.9* 13.4*  NEUTROABS  --   --   --  11.7*  HGB 10.3* 10.2* 9.3* 9.2*  HCT 33.0* 32.1* 30.3* 29.5*  MCV 99.7 99.7 101.0* 102.1*  PLT 177 147* 164 188    Medications:     arformoterol  15 mcg  Nebulization BID   aspirin  81 mg Per Tube Daily   atorvastatin  80 mg Per Tube Daily   budesonide (PULMICORT) nebulizer solution  0.5 mg Nebulization BID   chlorhexidine gluconate (MEDLINE KIT)  15 mL Mouth Rinse BID   Chlorhexidine Gluconate Cloth  6 each Topical Q0600   clonazePAM  0.5 mg Per Tube BID   feeding supplement (PROSource TF)  90 mL Per Tube TID   levothyroxine  50 mcg Per Tube Q0600   mouth rinse  15 mL Mouth Rinse 10 times per day   midodrine  20 mg Per Tube TID   pantoprazole sodium  40 mg Per Tube BID   revefenacin  175 mcg Nebulization Daily   sodium chloride flush  10-40 mL Intracatheter Q12H      Madelon Lips, MD 05/29/2021, 8:43 AM

## 2021-05-29 NOTE — Progress Notes (Signed)
   Discussion had with patient's wife, Palliative Care team, Dr. Doyne Keel & patient's daughter (by phone) regarding his condition and options for next steps.   We reviewed the fact that he has multi-system organ failure with little progress over the 3 weeks that he has been here and he is now vent and dialysis dependent. We also discussed that he has been intubated for 3 weeks and we need to make a decision regarding possible tracheostomy.   His daughter says she has had these discussions with her father before and he would not want a tracheostomy or tho have dialysis.   Thus, the plan will be for a one-way extubation in the next day or two. We will give him a chance to maintain his respiratory status after extubation but if failing will switch to comfort care to avoid suffering. Continue HD and other aggressive care for now.   They do not want CPR, defibrillation or code medications should he arrest. We have changed code status to DNR.   Additional CCT = 40 mins  [name], MD  2:29 PM

## 2021-05-29 NOTE — Progress Notes (Addendum)
Patient ID: [name], male   DOB: 1946/06/20, 75 y.o.   MRN: [id number]     Advanced Heart Failure Rounding Note  PCP-Cardiologist: None   Subjective:    Underwent DCCV 6/13. Maintaining NSR.   Remains intubated on CVVHD (intubated 5/29)  He is awake but not following commands for me. Remains on precedx.   On CVVHD. Weight down 14 pounds in 2 days. Still up about 20 pounds  He is on meropenem. NE requirements continue to increase 6-> 10 -> 15. Remains on NSR on amio gtt at 30/hr  + heparin. Co-ox 69%   TEE (6/13): EF 20-25%, mildly decreased RV systolic function   Objective:   Weight Range: 128.3 kg Body mass index is 44.3 kg/m.   Vital Signs:   Temp:  [97 F (36.1 C)-99.6 F (37.6 C)] 98.4 F (36.9 C) (06/19 0830) Pulse Rate:  [69-74] 70 (06/19 1000) Resp:  [18-30] 20 (06/19 1000) BP: (75-145)/(26-109) 109/51 (06/19 1000) SpO2:  [96 %-100 %] 99 % (06/19 1000) FiO2 (%):  [30 %-40 %] 30 % (06/19 0830) Last BM Date: 05/28/21  Weight change: Filed Weights   05/26/21 0431 05/27/21 0126 05/28/21 0215  Weight: 134.4 kg 131.7 kg 128.3 kg    Intake/Output:   Intake/Output Summary (Last 24 hours) at 05/29/2021 1023 Last data filed at 05/29/2021 1000 Gross per 24 hour  Intake 3735.68 ml  Output 6662 ml  Net -2926.32 ml       Physical Exam    General:  Obese male awake on vent but not following comands HEENT: + ETT Neck: supple. JVP to jaw Carotids 2+ bilat; no bruits. No lymphadenopathy or thryomegaly appreciated. Cor: PMI nondisplaced. Regular rate & rhythm. No rubs, gallops or murmurs. Lungs: coarse  Abdomen:  obese soft, nontender, nondistended. No hepatosplenomegaly. No bruits or masses. Good bowel sounds. Extremities: no cyanosis, clubbing, rash, 1+ edema Neuro: awake not following commands    Telemetry   A-Biv paced 80  Personally reviewed   Labs    CBC Recent Labs    05/28/21 0415 05/29/21 0252  WBC 14.9* 13.4*  NEUTROABS  --  11.7*   HGB 9.3* 9.2*  HCT 30.3* 29.5*  MCV 101.0* 102.1*  PLT 164 921    Basic Metabolic Panel Recent Labs    05/28/21 0415 05/28/21 1655 05/29/21 0252 05/29/21 0253  NA 137 135  --  137  K 4.3 4.8  --  4.5  CL 104 102  --  103  CO2 25 26  --  26  GLUCOSE 187* 241*  --  238*  BUN 35* 40*  --  32*  CREATININE 0.83 0.95  --  0.85  CALCIUM 7.5* 7.8*  --  7.7*  MG 2.4  --  2.3  --   PHOS 3.0 3.9  --  3.1    Liver Function Tests Recent Labs    05/28/21 1655 05/29/21 0253  ALBUMIN 1.9* 1.8*    No results for input(s): LIPASE, AMYLASE in the last 72 hours. Cardiac Enzymes No results for input(s): CKTOTAL, CKMB, CKMBINDEX, TROPONINI in the last 72 hours.  BNP: BNP (last 3 results) Recent Labs    04/28/2021 2322 05/14/21 0500 05/16/21 0340  BNP 281.2* 216.4* 78.5     ProBNP (last 3 results) No results for input(s): PROBNP in the last 8760 hours.   D-Dimer No results for input(s): DDIMER in the last 72 hours. Hemoglobin A1C No results for input(s): HGBA1C in the last 72 hours. Fasting  Lipid Panel No results for input(s): CHOL, HDL, LDLCALC, TRIG, CHOLHDL, LDLDIRECT in the last 72 hours. Thyroid Function Tests No results for input(s): TSH, T4TOTAL, T3FREE, THYROIDAB in the last 72 hours.  Invalid input(s): FREET3  Other results:   Imaging    No results found.   Medications:     Scheduled Medications:  arformoterol  15 mcg Nebulization BID   aspirin  81 mg Per Tube Daily   atorvastatin  80 mg Per Tube Daily   budesonide (PULMICORT) nebulizer solution  0.5 mg Nebulization BID   chlorhexidine gluconate (MEDLINE KIT)  15 mL Mouth Rinse BID   Chlorhexidine Gluconate Cloth  6 each Topical Q0600   clonazePAM  0.5 mg Per Tube BID   feeding supplement (PROSource TF)  90 mL Per Tube TID   levothyroxine  50 mcg Per Tube Q0600   mouth rinse  15 mL Mouth Rinse 10 times per day   midodrine  20 mg Per Tube TID   pantoprazole sodium  40 mg Per Tube BID    revefenacin  175 mcg Nebulization Daily   sodium chloride flush  10-40 mL Intracatheter Q12H    Infusions:   prismasol BGK 4/2.5 500 mL/hr at 05/28/21 1511    prismasol BGK 4/2.5 200 mL/hr at 05/27/21 1816   sodium chloride Stopped (05/29/21 0933)   sodium chloride     amiodarone 30 mg/hr (05/29/21 1000)   dexmedetomidine (PRECEDEX) IV infusion 0.4 mcg/kg/hr (05/29/21 1000)   feeding supplement (VITAL 1.5 CAL) 1,000 mL (05/28/21 0700)   heparin 2,050 Units/hr (05/29/21 1000)   insulin 3.4 Units/hr (05/29/21 1000)   meropenem (MERREM) IV 1 g (05/29/21 0933)   norepinephrine (LEVOPHED) Adult infusion 15 mcg/min (05/29/21 1000)   prismasol BGK 4/2.5 2,000 mL/hr at 05/28/21 1830    PRN Medications: [CANCELED] Place/Maintain arterial line **AND** sodium chloride, acetaminophen (TYLENOL) oral liquid 160 mg/5 mL, dextrose, docusate, fentaNYL (SUBLIMAZE) injection, heparin, metoprolol tartrate, mineral oil-hydrophilic petrolatum, polyethylene glycol, senna, sodium chloride, sodium chloride flush  Assessment/Plan    Shock: Suspect combination of septic shock and cardiogenic shock in setting of recurrent AF.  Echo 05/12/21 EF  20-25% (previous echo EF 35-40%). Bcx NGTD.  Resp cx - rare GPC.  Atrial flutter/RVR likely played a role. S/p DCCV to NSR 6/13 (TEE with EF 20-25%, mild RV dysfunction). Suspect significant vasodilatory/septic shock component predominating. Covering w/ meropenem. On NE 6 -> 10 -> 15  Co-ox 69%.  - Continue pressor support. Hopefully can begin to wean soon.  - Continue abx  - CCM managing 2. 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 Echo 05/12/21 EF 20-25% (previous Echo EF 35-40%).  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 Last cath 1/19 (LAD is patent, other vessels occluded with collaterals from the LAD; SVG to OM and SVG to PDA chronically occluded).  He had progressive  renal failure, now on CVVH.  Volume status improved. Remains on CVVHD  - Volume removal per Renal Weight down 14 pounds but weight still up about 20 pounds from baseline - Doubt ischemia is driving factor. No role for cath in setting of renal failure and poor prognosis  3. Recurrent AF/AFL with RVR: H/o DCCV in 12/21. S/p DCCV 6/13. Remains in NSR.  - Continue amiodarone gtt, 30 mg/hr - Continue heparin gtt. Watch for recurrent BRBPR 4. Acute hypercapenic/hypoxic respiratory failure: H/o COPD. Remains on vent since 5/29, failed SBT.  Need  to try to pull off fluid via CVVH and get him extubated.  Would be poor candidate for trach given his underlying problems. Family does not want trach  - On PS trial. I am worried he will not be able to wean easily from vent but hopefully fluid removal will help - CCM managing. 5. AKI on CKD IV: Baseline creatinine 1.3-1.7.  With AKI and volume overload, CVVH begun.  - Remains on CVVHD. Volume removal per Renal Would not be a good long-term HD candidate.  6. CAD: h/o CABG with occluded grafts.  Last cath 1/19 (LAD is patent, other vessels occluded with collaterals from the LAD; SVG to OM and SVG to PDA chronically occluded) - continue ASA/statin - Doubt ischemia is driving factor. No role for cath in setting of renal failure and poor prognosis  7. Thrombocytopenia: Resolved. - HIT negative 8. DM2 - per CCM  9. Urinary retention/hematuria: Foley had to be placed by Urology.  Suspect local Foley trauma.  10. COPD: Moderate to severe.  11. 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  12. Lung cancer: Had radiation.  13.  PAD: Occluded left SFA and significant right SFA stenosis on last CTA.  ABIs in 3/20 with significant disease bilaterally but stable.  14. ID: As above, concerned for septic shock component. Staph epi blood culture 1/4, likely contaminant.  - Currently on meropenem.  - Continue BP support as above 15. Neuro: will follow commands at times but not currently  -  continue to follow   Poor long-term prognosis with h/o advanced CHF and COPD as well as lung cancer.  Would not be a good candidate for trach or long-term HD and family would not want. I have consulted Palliative Care - await their input    CRITICAL CARE Performed by: [name]  Total critical care time: 35 minutes  Critical care time was exclusive of separately billable procedures and treating other patients.  Critical care was necessary to treat or prevent imminent or life-threatening deterioration.  Critical care was time spent personally by me on the following activities: development of treatment plan with patient and/or surrogate as well as nursing, discussions with consultants, evaluation of patient's response to treatment, examination of patient, obtaining history from patient or surrogate, ordering and performing treatments and interventions, ordering and review of laboratory studies, ordering and review of radiographic studies, pulse oximetry and re-evaluation of patient's condition.  [name] MD 05/29/2021 10:23 AM

## 2021-05-29 NOTE — Progress Notes (Signed)
Yupelri not loaded in pyxis, will page Rx for 0800 dose.

## 2021-05-29 NOTE — Progress Notes (Signed)
NAME:  [name], MRN:  [id number], DOB:  15-Jun-1946, LOS: 30 ADMISSION DATE:  04/11/2021, CONSULTATION DATE:  05/09/2021 REFERRING MD:  Dr. Randal Buba, CHIEF COMPLAINT:  SOB  History of Present Illness:  [name] is a 75 yo M with past medical hx as below presenting from home with complaints of SOB/ respiratory distress on 5/29.  Treated by EMS with duonebs, epi, solumedrol, and mag without improvement, requiring BVM.  On arrival to ER, patient required intubation as he was peri- arrest, unresponsive, bradycardic, temp 95.9, hypotensive, and apneic with diffuse apical wheeze requiring emergent intubation on arrival.  Noted to have pink frothy secretions on intubation.  PCCM called for admit.   Significant Hospital Events: Including procedures, antibiotic start and stop dates in addition to other pertinent events   5/30 admitted for respiratory failure/ intubated, empiric CAP coverage/ duresis 6/1 BLLE Duplex> no DVT, unable to complete thoracentesis due to anatomy  6/2 Tmax 101, -2.9L in last 24 hours, on levo at 39mcg, precedex. Resp culture negative. BC pending. 6/3 switched from heparin to argatroban for thrombocytopenia, HIT ab pending 6/4 HIT AB negative, switched back to heparin, failed SBT due to tachypnea 6/7 afib w/ RVR, HR increased abruptly to 140s from 90s, received amio bolus and started on drip, got fentanyl and versed, started cefepime and vanco, d/c norepi, receiving vasopressin, precedex, phenylephrine 6/9 - Stopped cefepime and started meropenem, continued vanco due to concern for cefepime- associated drug rash 6/12 unable to extubate. Afib RVR worse 6/13: on mech vent; Levo at 2; 60 Amio; Afib RVR persist; TEE and cardioverted 150 J into sinus rhythm and HR in 80s 6/14: Levo increased to 20; WBC and fever trending up; cultures sent; started back on meropenem and vanc 6/15: creatine function worsening; nephrology consulted, started on CRRT 6/16: Net UF goal increased.   Tolerated well.  Sputum came back as normal flora stop date placed for antibiotics.  Tolerated just over 2 hours of pressure support ventilation 6/17 continuing to pull fluid but decreased UF goal.  Decreasing RASS goal to 0 and attempt to more aggressively wean.  Decreased clonazepam to 0.5 mg twice a day from 1 mg twice a day, decreased as needed fentanyl dosing. 6/19 Family meeting to establish goals of care in light of planned extubation.    Interim History / Subjective:   Tolerating PSV, but remains on NE and CRRT.   Objective   Blood pressure (!) 97/25, pulse 70, temperature 98.3 F (36.8 C), temperature source Oral, resp. rate (!) 23, height 5\' 7"  (1.702 m), weight 128.3 kg, SpO2 98 %. CVP:  [4 mmHg-16 mmHg] 6 mmHg  Vent Mode: PSV;CPAP FiO2 (%):  [30 %-40 %] 30 % Set Rate:  [24 bmp] 24 bmp Vt Set:  [530 mL] 530 mL PEEP:  [5 cmH20] 5 cmH20 Pressure Support:  [8 cmH20] 8 cmH20 Plateau Pressure:  [18 cmH20] 18 cmH20   Intake/Output Summary (Last 24 hours) at 05/29/2021 1556 Last data filed at 05/29/2021 1400 Gross per 24 hour  Intake 3519.68 ml  Output 4987 ml  Net -1467.32 ml     Filed Weights   05/26/21 0431 05/27/21 0126 05/28/21 0215  Weight: 134.4 kg 131.7 kg 128.3 kg   Examination:  Physical exam: General: Crtitically ill-appearing male, orally intubated, morbid obesity HEENT: [place]/AT, eyes anicteric.  ETT and OGT in place Neuro: Eyes open, not following commands, generalized weak Chest: Coarse breath sounds, no wheezes or rhonchi Heart: Regular rate and rhythm, no murmurs  or gallops Abdomen: Soft, nontender, nondistended, bowel sounds present Skin: No rash  Labs/imaging that I havepersonally reviewed  (right click and "Reselect all SmartList Selections" daily)   Electrolytes normal   Resolved Hospital Problem list    Thrombocytopenia 4T score= 4, intermediate risk for HIT, although B- lactams can also decrease platelets. 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 He also still has advanced HFrEF with no long-term therapy options. Still not clear if would be able to tolerate IHD and no signs of spontaneous cardiac recovery. Tracheostomy would be required to ensure safe separation from ventilation but this would not allow for suitable long-term disposition.  At this point, the family feels that we are now moving beyond the patient's wishes and we are now planning a transition to comfort care tomorrow. Confirmed DNR.    Best Practice (right click and "Reselect all SmartList Selections" daily)   Diet/type: tubefeeds Pain/Anxiety/Delirium protocol Yes and RASS goal: 0 VAP protocol (if indicated): Yes DVT prophylaxis: systemic heparin GI prophylaxis: PPI Glucose control:  insulin gtt.  Central venous access:  Yes, and it is still needed Arterial line: [name] Foley:  Yes, and it is still needed Mobility:  bed rest  PT consulted: [name] Antibiotics:meropenem Antibiotic de-escalation: no,  continue current rx Code Status:  full code Last date of multidisciplinary goals of care discussion [6/19] transition  to comfort care tomorrow.  Disposition: remains critically ill, will stay in intensive care  CRITICAL CARE Performed by: [name]   Total critical care time: 40 minutes  Critical care time was exclusive of separately billable procedures and treating other patients.  Critical care was necessary to treat or prevent imminent or life-threatening deterioration.  Critical care was time spent personally by me on the following activities: development of treatment plan with patient and/or surrogate as well as nursing, discussions with consultants, evaluation of patient's response to treatment, examination of patient, obtaining history from patient or surrogate, ordering and performing treatments and interventions, ordering and review of laboratory studies, ordering and review of radiographic studies, pulse oximetry, re-evaluation of patient's condition and participation in multidisciplinary rounds.  [name], MD [facility] ICU Physician [place]  Pager: [phone] Mobile: [phone] After hours: [phone].

## 2021-05-29 NOTE — Progress Notes (Signed)
[place] for  heparin  Indication: afib  Labs: Recent Labs    05/27/21 0623 05/27/21 1542 05/27/21 2022 05/28/21 0415 05/28/21 1655 05/29/21 0252 05/29/21 0253  HGB 10.2*  --   --  9.3*  --  9.2*  --   HCT 32.1*  --   --  30.3*  --  29.5*  --   PLT 147*  --   --  164  --  188  --   HEPARINUNFRC  --    < > 0.46 0.23*  --   --  0.39  CREATININE  --    < >  --  0.83 0.95  --  0.85   < > = values in this interval not displayed.    Assessment: 75 yo male with h/o Afib, Xarelto on hold, new thrombocytopenia on heparin and concern for HIT so was switched to argatroban. Heparin antibody was negative, so switched back to heparin on 05/14/21.  Heparin level therapeutic, CBC stable. Reported BRBPR 6/17 but resolved.   Goal of Therapy:  Heparin level 0.3-0.7 units/ml Monitor platelets by anticoagulation protocol: Yes   Plan:  Continue  IV heparin 2050 units/h Daily heparin level and CBC   [name], PharmD, [place], [facility] Clinical Pharmacist [phone] Please check AMION for all [place] numbers 05/29/2021

## 2021-05-29 NOTE — Consult Note (Signed)
Consultation Note Date: 05/29/2021   Patient Name: [name]  DOB: [birth year]  MRN: [id number]  Age / Sex: 75 y.o., male  PCP: [name], MD Referring Physician: [name], MD  Reason for Consultation: Establishing goals of care  HPI/Patient Profile: 75 y.o. male  with past medical history of morbid obesity, heart failure with reduced ejection fraction (baseline 40%), CPOD, ventricular tachycardia, coronary artery disease, left bundle branch with, AAA, non-small cell lung cancer status post XRT, BPH, diabetes type 2, peripheral vascular disease  admitted on 05/07/2021 with Respiratory failure, CHF exacerbation, COPD exacerbation.  During this admission the patient developed shock, suspected combination septic shock and cardiogenic shock, sepsis, acute on chronic systolic heart failure, A. fib with RVR now status post DCCV, acute kidney injury on CKD requiring CRRT, pulmonary edema, fluid overload, thrombocytopenia (now resolved).  The palliative medicine team/ Suzan Nailer NP and Walden Field NP had a discussion with the patient's wife, [name], about the planned extubation scheduled for tomorrow.  She indicated that she understood that the patient would wake up and do better after the breathing tube was removed.  She was unaware of the gravity of his current clinical situation.  Subsequently, we involvedDr. Bensomhon (HF) and Dr. Lynetta Mare (PCCM); [name] also called the patient's daughter, [name] who lives in [place].  We had a discussion about the patient's grave clinical situation with multisystem organ failure with little noted progress over the past 3 weeks despite aggressive intervention.  Now vent and dialysis dependent, no improvement in heart function despite cardioversion, no improvement in kidney function despite CRRT.  Discussed the need for removal of the endotracheal tube given 3  weeks of intubation.  Options were discussed including extubation and continued medical support without escalation and subsequent shift to comfort care as a patient deteriorates.  The other option would be for placement of a tracheostomy knowing that this would likely require ongoing dialysis, long-term acute care or skilled nursing facility admission for many months and possibly indefinitely.  The patient's daughter shared that her discussions with her father indicated he would not want a tracheostomy, dialysis, long-term acute care/subacute care placement.  The patient's wife was tearful but seems to agree.  The indicated they would have a private discussion with her brother [name] who lives on the [address] in [place].  Decision was made to make the patient a "DNR" but keep the intubation until scheduled one-way extubation for tomorrow.  Clinical Assessment and Goals of Care: Multisystem organ failure as described above. Progressive acute on chronic heart failure, acute on chronic kidney disease, vent dependency, dialysis dependency. Grave clinical prognosis as per below. Goals include planned one-way extubation tomorrow and follow the patient's lead CODE STATUS changed to DNR If the patient decompensates after extubation we will shift to comfort care Further recommendations to follow patient's clinical progression.  Primary Decision Maker: NEXT OF KIN (wife)    SUMMARY OF RECOMMENDATIONS   Change status to DNR Liberalize visitation Planned one-way extubation tomorrow when family is  able to be with the patient Continued current medical support No escalation of care, no reintubation Shift to comfort care if the patient deteriorates   Code Status/Advance Care Planning: DNR but continued intubation/vent until extubation tomorrow   Symptom Management:  As needed after extubation  Palliative Prophylaxis:  Bowel Regimen, Frequent Pain Assessment, and Oral Care  Additional  Recommendations (Limitations, Scope, Preferences): Full Scope Treatment  Psycho-social/Spiritual:  Desire for further Chaplaincy support:yes Additional Recommendations: Caregiving  Support/Resources, Compassionate Wean Education, and Grief/Bereavement Support  Prognosis:  < 2 weeks  Discharge Planning: To Be Determined      Primary Diagnoses: Present on Admission: **None**   I have reviewed the medical record, interviewed the patient and family, and examined the patient. The following aspects are pertinent.  Past Medical History:  Diagnosis Date   AAA (abdominal aortic aneurysm) ([place])    followed by Dr. Bridgett Larsson   Adenomatous colon polyp 01/2004   Anemia    hx   Automatic implantable cardioverter-defibrillator in situ    AVM (arteriovenous malformation)    BPH (benign prostatic hypertrophy)    CAD (coronary artery disease)    s/p CABGx2 in 1996   Carotid artery stenosis    LCEA - Dr. Bridgett Larsson in 2013   CHF (congestive heart failure) (HCC)    Complication of anesthesia    claustrophobic, unabe to lie on back more than 4 hours at time due to back   COPD (chronic obstructive pulmonary disease) ([place])    Diabetes mellitus    DIET CONTROLLED- pt states that this was a misdiagnosis, he was treated while in the hospital  but returned home, loss a massive amount of weight and he has not had a problem with his blood sugar since. States everything has been normal for about 3 years.   Diverticulosis    Dyspnea    Dysrhythmia    ICD-defibrillator   Fatigue    GERD (gastroesophageal reflux disease)    H/O hiatal hernia    History of radiation therapy 04/09/17-04/17/17   SBRT right lung 54 Gy in 3 fractions   HLD (hyperlipidemia)    Hypertension    Hypothyroidism    Ischemic cardiomyopathy    Myocardial infarction ([place])    NSCL ca 2018   recurrence x 3   OSA on CPAP    AHI durign total sleep 14.69/hr, during REM 50.91/hr   Peripheral vascular disease (HCC)    LCEA, L renal artery  stent, bilat iliac stents, R SFA stenosis   Tremor    Ventricular tachycardia ([place]) 09/09/2014   Amiodarone was started after appropriate defibrillator shocks for ventricular tachycardia in October 2008   Social History   Socioeconomic History   Marital status: Married    Spouse name: Not on file   Number of children: 2   Years of education: Not on file   Highest education level: Not on file  Occupational History   Occupation: retired Solicitor   Tobacco Use   Smoking status: Former    Packs/day: 1.50    Years: 40.00    Pack years: 60.00    Types: Cigarettes, Pipe    Quit date: 12/11/2002    Years since quitting: 18.4   Smokeless tobacco: Never   Tobacco comments:    Pt reports smoking 1 pack/day before quitting in 2004  Vaping Use   Vaping Use: Never used  Substance and Sexual Activity   Alcohol use: No    Alcohol/week: 0.0 standard drinks  Drug use: No   Sexual activity: Not Currently  Other Topics Concern   Not on file  Social History Narrative   Not on file   Social Determinants of Health   Financial Resource Strain: Medium Risk   Difficulty of Paying Living Expenses: Somewhat hard  Food Insecurity: Not on file  Transportation Needs: Not on file  Physical Activity: Not on file  Stress: Not on file  Social Connections: Not on file   Family History  Problem Relation Age of Onset   Lung cancer Mother    Cancer Mother    Diabetes Mother    Hypertension Mother    Hyperlipidemia Mother    Heart disease Mother        before age 58   Heart attack Father    Heart disease Father        before age 38   Hypertension Father    Colon cancer Sister    Cancer Sister    Hypertension Sister    Hyperlipidemia Sister    Parkinson's disease Sister    Diabetes Sister    Heart disease Sister        before age 46   Heart attack Maternal Grandmother    Diabetes Daughter    Scheduled Meds:  arformoterol  15 mcg Nebulization BID   aspirin  81 mg Per  Tube Daily   atorvastatin  80 mg Per Tube Daily   budesonide (PULMICORT) nebulizer solution  0.5 mg Nebulization BID   chlorhexidine gluconate (MEDLINE KIT)  15 mL Mouth Rinse BID   Chlorhexidine Gluconate Cloth  6 each Topical Q0600   clonazePAM  0.5 mg Per Tube BID   feeding supplement (PROSource TF)  90 mL Per Tube TID   levothyroxine  50 mcg Per Tube Q0600   mouth rinse  15 mL Mouth Rinse 10 times per day   midodrine  20 mg Per Tube TID   pantoprazole sodium  40 mg Per Tube BID   revefenacin  175 mcg Nebulization Daily   sodium chloride flush  10-40 mL Intracatheter Q12H   Continuous Infusions:   prismasol BGK 4/2.5 500 mL/hr at 05/29/21 1123    prismasol BGK 4/2.5 200 mL/hr at 05/27/21 1816   sodium chloride 10 mL/hr at 05/29/21 1300   sodium chloride     amiodarone 30 mg/hr (05/29/21 1300)   dexmedetomidine (PRECEDEX) IV infusion 0.4 mcg/kg/hr (05/29/21 1300)   feeding supplement (VITAL 1.5 CAL) 1,000 mL (05/28/21 0700)   heparin 2,050 Units/hr (05/29/21 1300)   insulin Stopped (05/29/21 1238)   meropenem (MERREM) IV Stopped (05/29/21 1003)   norepinephrine (LEVOPHED) Adult infusion 10 mcg/min (05/29/21 1300)   prismasol BGK 4/2.5 2,000 mL/hr at 05/29/21 1119   PRN Meds:.[CANCELED] Place/Maintain arterial line **AND** sodium chloride, acetaminophen (TYLENOL) oral liquid 160 mg/5 mL, dextrose, docusate, fentaNYL (SUBLIMAZE) injection, heparin, metoprolol tartrate, mineral oil-hydrophilic petrolatum, polyethylene glycol, senna, sodium chloride, sodium chloride flush Medications Prior to Admission:  Prior to Admission medications   Medication Sig Start Date End Date Taking? Authorizing Provider  albuterol (PROVENTIL HFA;VENTOLIN HFA) 108 (90 BASE) MCG/ACT inhaler Inhale 2 puffs into the lungs every 6 (six) hours as needed for wheezing or shortness of breath. 12/25/14  Yes [name], MD  albuterol (PROVENTIL) (2.5 MG/3ML) 0.083% nebulizer solution USE 1 VIAL IN NEBULIZER 4  TIMES DAILY Patient taking differently: Take 2.5 mg by nebulization 4 (four) times daily. 12/30/20  Yes Baird Lyons D, MD  amiodarone (PACERONE) 200 MG tablet Take  1 tablet (200 mg total) by mouth daily. 12/17/20  Yes Larey Dresser, MD  atorvastatin (LIPITOR) 80 MG tablet TAKE 1 TABLET BY MOUTH EVERY DAY Patient taking differently: Take 80 mg by mouth daily. 01/31/21  Yes Larey Dresser, MD  ATROVENT HFA 17 MCG/ACT inhaler Inhale 2 puffs into the lungs every 4 (four) hours as needed for wheezing.  11/13/18  Yes [provider]  carvedilol (COREG) 12.5 MG tablet Take 1 tablet (12.5 mg total) by mouth 2 (two) times daily with a meal. 04/21/21  Yes Lyda Jester M, PA-C  dapagliflozin propanediol (FARXIGA) 10 MG TABS tablet Take 1 tablet (10 mg total) by mouth daily before breakfast. 06/29/20  Yes Larey Dresser, MD  diazepam (VALIUM) 5 MG tablet Take 1 tablet (5 mg total) by mouth every 8 (eight) hours as needed for anxiety. 1-2 tabs every 8 hours if needed for tremor 03/23/20  Yes [name], MD  ezetimibe (ZETIA) 10 MG tablet TAKE 1 TABLET BY MOUTH  DAILY Patient taking differently: Take 10 mg by mouth daily. 02/21/21  Yes Larey Dresser, MD  finasteride (PROSCAR) 5 MG tablet TAKE 1 TABLET BY MOUTH  DAILY Patient taking differently: Take 5 mg by mouth daily. 04/13/21  Yes Irine Seal, MD  fluticasone [facility]) 50 MCG/ACT nasal spray Instill 1 spray in each  nostril daily Patient taking differently: Place 1 spray into both nostrils daily as needed for allergies. 06/01/16  Yes Larey Dresser, MD  Fluticasone-Umeclidin-Vilant (TRELEGY ELLIPTA) 100-62.5-25 MCG/INH AEPB Inhale 1 puff into the lungs daily. Rinse mouth 05/31/20  Yes Young, Tarri Fuller D, MD  icosapent Ethyl (VASCEPA) 1 g capsule TAKE 2 CAPSULES (2 G TOTAL) BY MOUTH 2 (TWO) TIMES A DAY. Patient taking differently: Take 2 g by mouth 2 (two) times daily. 02/21/21  Yes Bensimhon, Shaune Pascal, MD  isosorbide mononitrate (IMDUR) 60  MG 24 hr tablet TAKE 1.5 TABLETS (90 MG TOTAL) BY MOUTH DAILY. Patient taking differently: Take 90 mg by mouth daily. 04/14/21  Yes Bensimhon, Shaune Pascal, MD  levothyroxine (SYNTHROID) 50 MCG tablet TAKE 1 TABLET BY MOUTH  DAILY BEFORE BREAKFAST Patient taking differently: Take 50 mcg by mouth daily before breakfast. 05/02/20  Yes [name], MD  Multiple Vitamins-Minerals (MULTIVITAMIN WITH MINERALS) tablet Take 1 tablet by mouth daily.   Yes [provider]  Multiple Vitamins-Minerals (PRESERVISION AREDS 2 PO) Take 1 capsule by mouth at bedtime.   Yes [provider]  nitroGLYCERIN (NITROSTAT) 0.4 MG SL tablet PLACE 1 TABLET UNDER THE TONGUE EVERY 5 MINUTES AS NEEDED FOR CHEST PAIN. Patient taking differently: Place 0.4 mg under the tongue every 5 (five) minutes as needed for chest pain. 04/18/21  Yes Larey Dresser, MD  NON FORMULARY See admin instructions. CPAP At bedtime And during the day as needed when napping   Yes [provider]  OXYGEN Inhale 2.5-3 L into the lungs continuous.   Yes [provider]  potassium chloride SA (KLOR-CON M20) 20 MEQ tablet Take 1.5 tablets (30 mEq total) by mouth daily. 05/05/21  Yes Lyda Jester M, PA-C  Rivaroxaban (XARELTO) 15 MG TABS tablet Take 1 tablet (15 mg total) by mouth daily with supper. 08/10/20  Yes Larey Dresser, MD  sacubitril-valsartan (ENTRESTO) 49-51 MG Take 1 tablet by mouth 2 (two) times daily.   Yes [provider]  sacubitril-valsartan (ENTRESTO) 97-103 MG Take 1 tablet by mouth 2 (two) times daily. 04/21/21  Yes Consuelo Pandy, PA-C  spironolactone (ALDACTONE) 25 MG tablet TAKE 1 TABLET BY MOUTH  DAILY Patient taking differently: Take 25 mg by mouth daily. 04/14/21  Yes Larey Dresser, MD  torsemide (DEMADEX) 20 MG tablet Take 3 tablets (60 mg total) by mouth in the morning AND 2 tablets (40 mg total) every evening. 04/19/21  Yes Rosita Fire, Brittainy M, PA-C  traMADol (ULTRAM) 50 MG  tablet Take 1 tablet (50 mg total) by mouth every 6 (six) hours as needed. Patient taking differently: Take 50 mg by mouth every 6 (six) hours as needed for moderate pain. 07/14/20  Yes [name], MD   Allergies  Allergen Reactions   Tussionex Pennkinetic Er [Hydrocod Polst-Cpm Polst Er] Other (See Comments)    UNSPECIFIED REACTION  > "caused prostate problems"   Ace Inhibitors Cough   Cefepime Rash   Review of Systems  Unable to perform ROS  Physical Exam Vitals and nursing note reviewed.  Constitutional:      General: He is not in acute distress.    Appearance: He is obese. He is ill-appearing.     Interventions: He is intubated and restrained.  Cardiovascular:     Rate and Rhythm: Normal rate and regular rhythm.  Pulmonary:     Effort: Pulmonary effort is normal. He is intubated.     Comments: intubated Abdominal:     General: Abdomen is protuberant.     Palpations: Abdomen is soft.  Musculoskeletal:     Comments: Mits in place bilateral hands  Neurological:     Mental Status: He is alert.     Comments: Makes eye contact, not truly following commands    Vital Signs: BP (!) 119/98   Pulse 70   Temp 98.3 F (36.8 C) (Oral)   Resp (!) 21   Ht _0  (1.702 m)   Wt 128.3 kg   SpO2 98%   BMI 44.30 kg/m  Pain Scale: 0-10 POSS *See Group Information*: 2-Acceptable,Slightly drowsy, easily aroused Pain Score: 0-No pain   SpO2: SpO2: 98 % O2 Device:SpO2: 98 % O2 Flow Rate: .   IO: Intake/output summary:  Intake/Output Summary (Last 24 hours) at 05/29/2021 1330 Last data filed at 05/29/2021 1300 Gross per 24 hour  Intake 3663.27 ml  Output 5302 ml  Net -1638.73 ml    LBM: Last BM Date: 05/28/21 Baseline Weight: Weight: 130 kg Most recent weight: Weight: 128.3 kg     Palliative Assessment/Data: 10%   Discussed with Dr. Haroldine Laws, Dr.Agarwala, Patient Wife [name]) and daughter [name])   Time In: 10:30 Time Out: 12:30 Time Total: 120 minutes  Greater  than 50%  of this time was spent counseling and coordinating care related to the above assessment and plan.  Signed by: Walden Field, NP Wadie Lessen, NP  I was present for above GOCs meeting and in agreement with above plan and  documentation    Please contact Palliative Medicine Team phone at [phone] for questions and concerns.  For individual provider: See Shea Evans

## 2021-05-30 ENCOUNTER — Inpatient Hospital Stay (HOSPITAL_COMMUNITY): Payer: Medicare Other

## 2021-05-30 DIAGNOSIS — R579 Shock, unspecified: Secondary | ICD-10-CM | POA: Diagnosis not present

## 2021-05-30 DIAGNOSIS — Z66 Do not resuscitate: Secondary | ICD-10-CM | POA: Diagnosis not present

## 2021-05-30 DIAGNOSIS — N179 Acute kidney failure, unspecified: Secondary | ICD-10-CM | POA: Diagnosis not present

## 2021-05-30 DIAGNOSIS — I480 Paroxysmal atrial fibrillation: Secondary | ICD-10-CM | POA: Diagnosis not present

## 2021-05-30 DIAGNOSIS — R0600 Dyspnea, unspecified: Secondary | ICD-10-CM

## 2021-05-30 DIAGNOSIS — K117 Disturbances of salivary secretion: Secondary | ICD-10-CM | POA: Diagnosis not present

## 2021-05-30 DIAGNOSIS — R092 Respiratory arrest: Secondary | ICD-10-CM | POA: Diagnosis not present

## 2021-05-30 DIAGNOSIS — I5023 Acute on chronic systolic (congestive) heart failure: Secondary | ICD-10-CM | POA: Diagnosis not present

## 2021-05-30 DIAGNOSIS — Z515 Encounter for palliative care: Secondary | ICD-10-CM | POA: Diagnosis not present

## 2021-05-30 LAB — CBC
HCT: 31.2 % — ABNORMAL LOW (ref 39.0–52.0)
Hemoglobin: 9.6 g/dL — ABNORMAL LOW (ref 13.0–17.0)
MCH: 31.5 pg (ref 26.0–34.0)
MCHC: 30.8 g/dL (ref 30.0–36.0)
MCV: 102.3 fL — ABNORMAL HIGH (ref 80.0–100.0)
Platelets: 202 10*3/uL (ref 150–400)
RBC: 3.05 MIL/uL — ABNORMAL LOW (ref 4.22–5.81)
RDW: 15.6 % — ABNORMAL HIGH (ref 11.5–15.5)
WBC: 11.8 10*3/uL — ABNORMAL HIGH (ref 4.0–10.5)
nRBC: 0 % (ref 0.0–0.2)

## 2021-05-30 LAB — RENAL FUNCTION PANEL
Albumin: 1.9 g/dL — ABNORMAL LOW (ref 3.5–5.0)
Albumin: 2.2 g/dL — ABNORMAL LOW (ref 3.5–5.0)
Anion gap: 6 (ref 5–15)
Anion gap: 8 (ref 5–15)
BUN: 25 mg/dL — ABNORMAL HIGH (ref 8–23)
BUN: 22 mg/dL (ref 8–23)
CO2: 27 mmol/L (ref 22–32)
CO2: 26 mmol/L (ref 22–32)
Calcium: 8.1 mg/dL — ABNORMAL LOW (ref 8.9–10.3)
Calcium: 8.5 mg/dL — ABNORMAL LOW (ref 8.9–10.3)
Chloride: 103 mmol/L (ref 98–111)
Chloride: 101 mmol/L (ref 98–111)
Creatinine, Ser: 0.71 mg/dL (ref 0.61–1.24)
Creatinine, Ser: 0.79 mg/dL (ref 0.61–1.24)
GFR, Estimated: >60 mL/min (ref >60)
GFR, Estimated: >60 mL/min (ref >60)
Glucose, Bld: 221 mg/dL — ABNORMAL HIGH (ref 70–99)
Glucose, Bld: 178 mg/dL — ABNORMAL HIGH (ref 70–99)
Phosphorus: 2.9 mg/dL (ref 2.5–4.6)
Phosphorus: 3.2 mg/dL (ref 2.5–4.6)
Potassium: 4.6 mmol/L (ref 3.5–5.1)
Potassium: 5.2 mmol/L — ABNORMAL HIGH (ref 3.5–5.1)
Sodium: 136 mmol/L (ref 135–145)
Sodium: 135 mmol/L (ref 135–145)

## 2021-05-30 LAB — GLUCOSE, CAPILLARY
Glucose-Capillary: 113 mg/dL — ABNORMAL HIGH (ref 70–99)
Glucose-Capillary: 131 mg/dL — ABNORMAL HIGH (ref 70–99)
Glucose-Capillary: 154 mg/dL — ABNORMAL HIGH (ref 70–99)
Glucose-Capillary: 158 mg/dL — ABNORMAL HIGH (ref 70–99)
Glucose-Capillary: 161 mg/dL — ABNORMAL HIGH (ref 70–99)
Glucose-Capillary: 162 mg/dL — ABNORMAL HIGH (ref 70–99)
Glucose-Capillary: 90 mg/dL (ref 70–99)
Glucose-Capillary: 98 mg/dL (ref 70–99)

## 2021-05-30 LAB — HEPARIN LEVEL (UNFRACTIONATED): Heparin Unfractionated: 0.42 IU/mL (ref 0.30–0.70)

## 2021-05-30 LAB — COOXEMETRY PANEL
Carboxyhemoglobin: 0.9 % (ref 0.5–1.5)
Methemoglobin: 1 % (ref 0.0–1.5)
O2 Saturation: 71.3 %
Total hemoglobin: 9.9 g/dL — ABNORMAL LOW (ref 12.0–16.0)

## 2021-05-30 LAB — MAGNESIUM: Magnesium: 2.6 mg/dL — ABNORMAL HIGH (ref 1.7–2.4)

## 2021-05-30 MED ORDER — INSULIN ASPART 100 UNIT/ML IJ SOLN
7.0000 [IU] | INTRAMUSCULAR | Status: DC
  Administered 2021-05-31 – 2021-06-01 (×6): 7 [IU] via SUBCUTANEOUS

## 2021-05-30 MED ORDER — DEXTROSE 10 % IV SOLN: INTRAVENOUS | Status: DC | PRN

## 2021-05-30 MED ORDER — ALTEPLASE 2 MG IJ SOLR
2.0000 mg | Freq: Once | INTRAMUSCULAR | Status: AC
  Administered 2021-05-30: 2 mg
  Filled 2021-05-30: qty 2

## 2021-05-30 MED ORDER — INSULIN DETEMIR 100 UNIT/ML ~~LOC~~ SOLN
22.0000 [IU] | Freq: Two times a day (BID) | SUBCUTANEOUS | Status: DC
  Administered 2021-05-30 – 2021-06-01 (×4): 22 [IU] via SUBCUTANEOUS
  Filled 2021-05-30 (×6): qty 0.22

## 2021-05-30 MED ORDER — LORAZEPAM 2 MG/ML IJ SOLN: 1.0000 mg | INTRAMUSCULAR | Status: DC | PRN

## 2021-05-30 MED ORDER — GLYCOPYRROLATE 0.2 MG/ML IJ SOLN
0.2000 mg | Freq: Four times a day (QID) | INTRAMUSCULAR | Status: DC
  Administered 2021-05-30 – 2021-05-31 (×5): 0.2 mg via INTRAVENOUS
  Filled 2021-05-30 (×5): qty 1

## 2021-05-30 MED ORDER — FENTANYL CITRATE (PF) 100 MCG/2ML IJ SOLN: 50.0000 ug | INTRAMUSCULAR | Status: DC | PRN

## 2021-05-30 MED ORDER — ALTEPLASE 2 MG IJ SOLR
2.0000 mg | Freq: Once | INTRAMUSCULAR | Status: AC
Start: 2021-05-30 — End: 2021-05-30
  Administered 2021-05-30: 2 mg
  Filled 2021-05-30: qty 2

## 2021-05-30 MED ORDER — INSULIN ASPART 100 UNIT/ML IJ SOLN
1.0000 [IU] | INTRAMUSCULAR | Status: DC
Start: 2021-05-30 — End: 2021-06-01
  Administered 2021-05-30: 1 [IU] via SUBCUTANEOUS
  Administered 2021-05-31: 2 [IU] via SUBCUTANEOUS
  Administered 2021-05-31 (×2): 1 [IU] via SUBCUTANEOUS
  Administered 2021-06-01 (×2): 2 [IU] via SUBCUTANEOUS
  Administered 2021-06-01: 1 [IU] via SUBCUTANEOUS

## 2021-05-30 NOTE — Progress Notes (Signed)
[facility] [place] [facility] for  heparin  Indication: afib  Labs: Recent Labs    05/28/21 0415 05/28/21 1655 05/29/21 0252 05/29/21 0253 05/29/21 1211 05/29/21 1615 05/30/21 0212  HGB 9.3*  --  9.2*  --   --   --  9.6*  HCT 30.3*  --  29.5*  --   --   --  31.2*  PLT 164  --  188  --   --   --  202  HEPARINUNFRC 0.23*  --   --  0.39 0.42  --  0.42  CREATININE 0.83   < >  --  0.85 0.84 0.83 0.71   < > = values in this interval not displayed.    Assessment: 75 yo male with h/o Afib, Xarelto on hold, new thrombocytopenia on heparin and concern for HIT so was switched to argatroban. Heparin antibody was negative, so switched back to heparin on 05/14/21.  Heparin level therapeutic at 0.42, CBC stable. Reported BRBPR 6/17 but resolved.   Goal of Therapy:  Heparin level 0.3-0.7 units/ml Monitor platelets by anticoagulation protocol: Yes   Plan:  Continue IV heparin 2050 units/h Daily heparin level and CBC  Joetta Manners, PharmD, BCCCP Emergency Medicine Clinical Pharmacist  Please check AMION for all [place] phone numbers After 10:00 PM, call [place] [phone]

## 2021-05-30 NOTE — Progress Notes (Signed)
eLink Physician-Brief Progress Note Patient Name: [name] DOB: 07/24/1946 MRN: [id number]   Date of Service  05/30/2021  HPI/Events of Note  Notified of hypoglycemia Extubated today with no feeding tube in place Discussed with bedside RN, patient too weak and confused to start oral feeding anytime soon EF 20% with volume overload undergoing dialysis  eICU Interventions  Insert NGT and resume tube feeding     Intervention Category Intermediate Interventions: Other:  Judd Lien 05/30/2021, 9:39 PM

## 2021-05-30 NOTE — Progress Notes (Signed)
NAME:  [name], MRN:  [id number], DOB:  03-Feb-1946, LOS: 21 ADMISSION DATE:  04/15/2021, CONSULTATION DATE:  05/09/2021 REFERRING MD:  Dr. Randal Buba, CHIEF COMPLAINT:  SOB  History of Present Illness:  [name] is a 75 yo M with past medical hx as below presenting from home with complaints of SOB/ respiratory distress on 5/29.  Treated by EMS with duonebs, epi, solumedrol, and mag without improvement, requiring BVM.  On arrival to ER, patient required intubation as he was peri- arrest, unresponsive, bradycardic, temp 95.9, hypotensive, and apneic with diffuse apical wheeze requiring emergent intubation on arrival.  Noted to have pink frothy secretions on intubation.  PCCM called for admit.   Significant Hospital Events: Including procedures, antibiotic start and stop dates in addition to other pertinent events   5/30 admitted for respiratory failure/ intubated, empiric CAP coverage/ duresis 6/1 BLLE Duplex> no DVT, unable to complete thoracentesis due to anatomy  6/2 Tmax 101, -2.9L in last 24 hours, on levo at 61mcg, precedex. Resp culture negative. BC pending. 6/3 switched from heparin to argatroban for thrombocytopenia, HIT ab pending 6/4 HIT AB negative, switched back to heparin, failed SBT due to tachypnea 6/7 afib w/ RVR, HR increased abruptly to 140s from 90s, received amio bolus and started on drip, got fentanyl and versed, started cefepime and vanco, d/c norepi, receiving vasopressin, precedex, phenylephrine 6/9 - Stopped cefepime and started meropenem, continued vanco due to concern for cefepime- associated drug rash 6/12 unable to extubate. Afib RVR worse 6/13: on mech vent; Levo at 2; 60 Amio; Afib RVR persist; TEE and cardioverted 150 J into sinus rhythm and HR in 80s 6/14: Levo increased to 20; WBC and fever trending up; cultures sent; started back on meropenem and vanc 6/15: creatine function worsening; nephrology consulted, started on CRRT 6/16: Net UF goal increased.   Tolerated well.  Sputum came back as normal flora stop date placed for antibiotics.  Tolerated just over 2 hours of pressure support ventilation 6/17 continuing to pull fluid but decreased UF goal.  Decreasing RASS goal to 0 and attempt to more aggressively wean.  Decreased clonazepam to 0.5 mg twice a day from 1 mg twice a day, decreased as needed fentanyl dosing. 6/19 Family meeting to establish goals of care in light of planned extubation. Decided on one way extubation given limited improvement on aggressive therapy.    Interim History / Subjective:   Tolerating PSV, remains on NE and CRRT.  More awake today.  Objective   Blood pressure (!) 89/62, pulse 70, temperature 97.9 F (36.6 C), temperature source Axillary, resp. rate (!) 22, height 5\' 7"  (1.702 m), weight 128.3 kg, SpO2 99 %. CVP:  [3 mmHg-10 mmHg] 7 mmHg  Vent Mode: PSV;CPAP FiO2 (%):  [30 %-40 %] 30 % Set Rate:  [24 bmp] 24 bmp Vt Set:  [530 mL] 530 mL PEEP:  [5 cmH20] 5 cmH20 Pressure Support:  [8 cmH20] 8 cmH20   Intake/Output Summary (Last 24 hours) at 05/30/2021 1224 Last data filed at 05/30/2021 1200 Gross per 24 hour  Intake 3513.7 ml  Output 4978 ml  Net -1464.3 ml     Filed Weights   05/26/21 0431 05/27/21 0126 05/28/21 0215  Weight: 134.4 kg 131.7 kg 128.3 kg   Examination:  Physical exam: General: Crtitically ill-appearing male, orally intubated, morbid obesity HEENT: [name]/AT, eyes anicteric.  ETT and OGT in place Neuro: Eyes open, tracks to voice and follows commands, lifts arms Chest: Coarse breath sounds, no wheezes  or rhonchi Heart: Regular rate and rhythm, no murmurs or gallops.  Essentially no residual edema.  Abdomen: Soft, nontender, nondistended, bowel sounds present Skin: No rash  Labs/imaging that I havepersonally reviewed  (right click and "Reselect all SmartList Selections" daily)   Electrolytes normal, mild anemia  Resolved Hospital Problem list    Thrombocytopenia 4T score= 4,  intermediate risk for HIT, although B- lactams can also decrease platelets. 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 DNR, no re-intubation because if this were needed, this would imply the need for protracted support and a tracheostomy which the patient doesn't want.  - At present he is euvolemic and awake and might be in the best position for a trial of extubation.   - If he tolerates this, the next step would be a trial off CRRT. - We will transition promptly to comfort care if the patient shows signs of failure.    Best Practice (right click and "Reselect all SmartList Selections" daily)   Diet/type: tubefeeds Pain/Anxiety/Delirium protocol Yes and RASS goal: 0 VAP protocol (if indicated): Yes DVT prophylaxis: systemic heparin GI prophylaxis: PPI Glucose control:  insulin gtt.  Central venous access:  Yes, and it is still needed Arterial line: [name] Foley:  Yes, and it is still needed Mobility:  bed rest  PT consulted: [name] Antibiotics:meropenem Antibiotic de-escalation: no,  continue current rx Code Status:  full code Last date of multidisciplinary goals of care discussion [6/19], one way extubation today.  Disposition: remains critically ill, will stay in  intensive care  CRITICAL CARE Performed by: [name]   Total critical care time: 35 minutes  Critical care time was exclusive of separately billable procedures and treating other patients.  Critical care was necessary to treat or prevent imminent or life-threatening deterioration.  Critical care was time spent personally by me on the following activities: development of treatment plan with patient and/or surrogate as well as nursing, discussions with consultants, evaluation of patient's response to treatment, examination of patient, obtaining history from patient or surrogate, ordering and performing treatments and interventions, ordering and review of laboratory studies, ordering and review of radiographic studies, pulse oximetry, re-evaluation of patient's condition and participation in multidisciplinary rounds.  Tyrone Brood, MD [facility] ICU Physician [facility]  Pager: [phone] Mobile: [phone] After hours: [phone].

## 2021-05-30 NOTE — Progress Notes (Signed)
Pt one-way extubated to 6L [place]. Currently having a hard time with secretions, cough is very weak. Fluid is able to be heard on exhale. RN made aware.

## 2021-05-30 NOTE — Progress Notes (Signed)
Patient ID: Tyrone FELLS, male   DOB: December 24, 1945, 75 y.o.   MRN: [id number]     Advanced Heart Failure Rounding Note  PCP-Cardiologist: None   Subjective:    Underwent DCCV 6/13. Maintaining NSR.   Remains intubated on CVVHD (intubated 5/29)  He is awake and able to follow some commands today.  On CVVHD. Weight down another 8 pounds.  He is on meropenem. Remains on NE 12  In NSR on IV amio   Family meeting 6/19 patient would not want trach or long-term HD  TEE (6/13): EF 20-25%, mildly decreased RV systolic function   Objective:   Weight Range: 128.3 kg Body mass index is 44.3 kg/m.   Vital Signs:   Temp:  [97.9 F (36.6 C)-98.8 F (37.1 C)] 97.9 F (36.6 C) (06/20 0700) Pulse Rate:  [69-71] 70 (06/20 0930) Resp:  [15-29] 22 (06/20 0930) BP: (64-168)/(25-129) 89/62 (06/20 0930) SpO2:  [97 %-100 %] 99 % (06/20 0930) FiO2 (%):  [30 %-40 %] 30 % (06/20 0806) Last BM Date: 05/29/21  Weight change: Filed Weights   05/26/21 0431 05/27/21 0126 05/28/21 0215  Weight: 134.4 kg 131.7 kg 128.3 kg    Intake/Output:   Intake/Output Summary (Last 24 hours) at 05/30/2021 1131 Last data filed at 05/30/2021 1100 Gross per 24 hour  Intake 3527.89 ml  Output 4928 ml  Net -1400.11 ml       Physical Exam    General:  Awake on vent. Chronically ill and weak appearing. Able to follow some commands HEENT: normal + ETT Neck: supple. no JVD. Carotids 2+ bilat; no bruits. No lymphadenopathy or thryomegaly appreciated. Cor: PMI nondisplaced. Regular rate & rhythm. No rubs, gallops or murmurs. Lungs: clear Abdomen: obese soft, nontender, nondistended. No hepatosplenomegaly. No bruits or masses. Good bowel sounds. Extremities: no cyanosis, clubbing, rash, edema Neuro: as above    Telemetry   A-Biv paced 80 Personally reviewed   Labs    CBC Recent Labs    05/29/21 0252 05/30/21 0212  WBC 13.4* 11.8*  NEUTROABS 11.7*  --   HGB 9.2* 9.6*  HCT 29.5* 31.2*  MCV  102.1* 102.3*  PLT 188 938    Basic Metabolic Panel Recent Labs    05/29/21 0252 05/29/21 0253 05/29/21 1615 05/30/21 0212  NA  --    < > 134* 136  K  --    < > 4.6 4.6  CL  --    < > 103 103  CO2  --    < > 26 27  GLUCOSE  --    < > 184* 221*  BUN  --    < > 27* 25*  CREATININE  --    < > 0.83 0.71  CALCIUM  --    < > 7.8* 8.1*  MG 2.3  --   --  2.6*  PHOS  --    < > 3.3 2.9   < > = values in this interval not displayed.    Liver Function Tests Recent Labs    05/29/21 1615 05/30/21 0212  ALBUMIN 1.9* 1.9*    No results for input(s): LIPASE, AMYLASE in the last 72 hours. Cardiac Enzymes No results for input(s): CKTOTAL, CKMB, CKMBINDEX, TROPONINI in the last 72 hours.  BNP: BNP (last 3 results) Recent Labs    04/27/2021 2322 05/14/21 0500 05/16/21 0340  BNP 281.2* 216.4* 78.5     ProBNP (last 3 results) No results for input(s): PROBNP in the last 8760 hours.  D-Dimer No results for input(s): DDIMER in the last 72 hours. Hemoglobin A1C No results for input(s): HGBA1C in the last 72 hours. Fasting Lipid Panel No results for input(s): CHOL, HDL, LDLCALC, TRIG, CHOLHDL, LDLDIRECT in the last 72 hours. Thyroid Function Tests No results for input(s): TSH, T4TOTAL, T3FREE, THYROIDAB in the last 72 hours.  Invalid input(s): FREET3  Other results:   Imaging    No results found.   Medications:     Scheduled Medications:  arformoterol  15 mcg Nebulization BID   aspirin  81 mg Per Tube Daily   atorvastatin  80 mg Per Tube Daily   budesonide (PULMICORT) nebulizer solution  0.5 mg Nebulization BID   chlorhexidine gluconate (MEDLINE KIT)  15 mL Mouth Rinse BID   Chlorhexidine Gluconate Cloth  6 each Topical Q0600   clonazePAM  0.5 mg Per Tube BID   feeding supplement (PROSource TF)  90 mL Per Tube TID   insulin aspart  1-3 Units Subcutaneous Q4H   insulin aspart  7 Units Subcutaneous Q4H   insulin detemir  22 Units Subcutaneous Q12H    levothyroxine  50 mcg Per Tube Q0600   mouth rinse  15 mL Mouth Rinse 10 times per day   midodrine  20 mg Per Tube TID   pantoprazole sodium  40 mg Per Tube BID   revefenacin  175 mcg Nebulization Daily   sodium chloride flush  10-40 mL Intracatheter Q12H    Infusions:   prismasol BGK 4/2.5 500 mL/hr at 05/30/21 0758    prismasol BGK 4/2.5 200 mL/hr at 05/29/21 2136   sodium chloride Stopped (05/30/21 1039)   sodium chloride     amiodarone 30 mg/hr (05/30/21 1130)   dexmedetomidine (PRECEDEX) IV infusion 0.3 mcg/kg/hr (05/30/21 1100)   dextrose     feeding supplement (VITAL 1.5 CAL) 1,000 mL (05/29/21 1541)   heparin 2,050 Units/hr (05/30/21 1100)   meropenem (MERREM) IV Stopped (05/30/21 0937)   norepinephrine (LEVOPHED) Adult infusion 12 mcg/min (05/30/21 1100)   prismasol BGK 4/2.5 2,000 mL/hr at 05/30/21 0910    PRN Medications: [CANCELED] Place/Maintain arterial line **AND** sodium chloride, acetaminophen (TYLENOL) oral liquid 160 mg/5 mL, dextrose, docusate, fentaNYL (SUBLIMAZE) injection, heparin, metoprolol tartrate, mineral oil-hydrophilic petrolatum, polyethylene glycol, senna, sodium chloride, sodium chloride flush  Assessment/Plan    Shock: Suspect combination of septic shock and cardiogenic shock in setting of recurrent AF.  Echo 05/12/21 EF  20-25% (previous echo EF 35-40%). Bcx NGTD.  Resp cx - rare GPC.  Atrial flutter/RVR likely played a role. S/p DCCV to NSR 6/13 (TEE with EF 20-25%, mild RV dysfunction). Suspect significant vasodilatory/septic shock component predominating. Covering w/ meropenem. On NE 6 -> 10 -> 15  Co-ox 69%.  - Continue pressor support. Hopefully can begin to wean soon.  - Continue abx  - CCM managing 2. 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 Echo 05/12/21 EF 20-25% (previous Echo EF 35-40%).  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 Last cath  1/19 (LAD is patent, other vessels occluded with collaterals from the LAD; SVG to OM and SVG to PDA chronically occluded).  He had progressive renal failure, now on CVVH.  Volume status improved. Remains on CVVHD  - Volume status much improved. Down almost 25 pounds with CVVHD.  - Remains on NE 12 for BP and inotropic support. Co-ox 71% - Doubt ischemia is driving factor. No role for cath in  setting of renal failure and poor prognosis  3. Recurrent AF/AFL with RVR: H/o DCCV in 12/21. S/p DCCV 6/13. Remains in NSR.  - Continue amiodarone gtt, 30 mg/hr - Continue heparin gtt. Watch for recurrent BRBPR 4. Acute hypercapenic/hypoxic respiratory failure: H/o COPD. Remains on vent since 5/29, failed SBT.  Need to try to pull off fluid via CVVH and get him extubated.  Would be poor candidate for trach given his underlying problems. Family does not want trach  - Plan one-way extubation today possibly to CPAP to see how he does. If unable to thrive would switch to comfort care - CCM managing. 5. AKI on CKD IV: Baseline creatinine 1.3-1.7.  With AKI and volume overload, CVVH begun.  - Remains on CVVHD. Volume removal per Renal Would not be a good long-term HD candidate.  6. CAD: h/o CABG with occluded grafts.  Last cath 1/19 (LAD is patent, other vessels occluded with collaterals from the LAD; SVG to OM and SVG to PDA chronically occluded) - continue ASA/statin - Doubt ischemia is driving factor. No role for cath in setting of renal failure and poor prognosis  7. Thrombocytopenia: Resolved. - HIT negative 8. DM2 - per CCM  9. Urinary retention/hematuria: Foley had to be placed by Urology.  Suspect local Foley trauma.  10. COPD: Moderate to severe.  11. 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  12. Lung cancer: Had radiation.  13.  PAD: Occluded left SFA and significant right SFA stenosis on last CTA.  ABIs in 3/20 with significant disease bilaterally but stable.  14. ID: As above, concerned for septic shock component.  Staph epi blood culture 1/4, likely contaminant.  - Currently on meropenem.  - Continue BP support as above 15. Neuro: more alert today follows some comamnds   Family meeting 6/19 patient would not want trach or long-term HD. Plan 1-way extubation today with full support. If patient unable to maintain respiratory status then would switch to comfort care. D/w CCM.   CRITICAL CARE Performed by: [name]  Total critical care time: 35 minutes  Critical care time was exclusive of separately billable procedures and treating other patients.  Critical care was necessary to treat or prevent imminent or life-threatening deterioration.  Critical care was time spent personally by me on the following activities: development of treatment plan with patient and/or surrogate as well as nursing, discussions with consultants, evaluation of patient's response to treatment, examination of patient, obtaining history from patient or surrogate, ordering and performing treatments and interventions, ordering and review of laboratory studies, ordering and review of radiographic studies, pulse oximetry and re-evaluation of patient's condition.  [name] MD 05/30/2021 11:31 AM

## 2021-05-30 NOTE — Progress Notes (Signed)
[facility] ROUNDING NOTE   Subjective:   Interval History: This is a 75 year old pleasant gentleman who was admitted with respiratory distress 04/15/2021 in the emergency room he required intubation he was unresponsive hypotensive.  He started on pressors and broad-spectrum antibiotics.  His baseline serum creatinine about 1.5 mg/dL and was started on CRRT 05/25/2021.  Blood pressure 79/61 pulse 70 temperature 98.8 O2 sats 100% FiO2 40%.  Urine output 420 cc 05/29/2021.  Appears to have a negative fluid balance of about 75 cc an hour.  Sodium 136 potassium 4.6 chloride 103 CO2 27 BUN 25 creatinine 0.7 glucose 221 phosphorus 2.9 magnesium 2.6 albumin 1.9 hemoglobin 9.6    Objective:  Vital signs in last 24 hours:  Temp:  [98.3 F (36.8 C)-98.8 F (37.1 C)] 98.8 F (37.1 C) (06/20 0400) Pulse Rate:  [69-72] 70 (06/20 0600) Resp:  [15-29] 20 (06/20 0600) BP: (75-168)/(25-129) 112/86 (06/20 0530) SpO2:  [96 %-100 %] 100 % (06/20 0600) FiO2 (%):  [30 %-40 %] 40 % (06/20 0329)  Weight change:  Filed Weights   05/26/21 0431 05/27/21 0126 05/28/21 0215  Weight: 134.4 kg 131.7 kg 128.3 kg    Intake/Output: I/O last 3 completed shifts: In: 5303.1 [I.V.:2578.3; NG/GT:2225; IV Piggyback:499.9] Out: 0786 [Urine:590; LJQGB:2010; OFHQR:9758]   Intake/Output this shift:  Total I/O In: 1137.9 [I.V.:707.9; NG/GT:330; IV Piggyback:100] Out: 1949 [Urine:10; Other:1939]  GEN: NAD,lying in bed awake HEENT sclera anicteric NECK + JVD PULM coarse rhonchi throughout anteriorly, L > R CV RRR ABD soft, mildly distended with abd wall edema EXT 2+ anasarca with areas of weeping, slightly improved NEURO intubated and sedated ACCESS: R IJ nontunneled HD cath   Basic Metabolic Panel: Recent Labs  Lab 05/26/21 0410 05/26/21 1546 05/27/21 0442 05/27/21 1600 05/28/21 0415 05/28/21 1655 05/29/21 0252 05/29/21 0253 05/29/21 1211 05/29/21 1615 05/30/21 0212  NA 137   < > 137    < > 137 135  --  137 136 134* 136  K 4.5   < > 4.2   < > 4.3 4.8  --  4.5 4.5 4.6 4.6  CL 105   < > 102   < > 104 102  --  103 103 103 103  CO2 22   < > 25   < > 25 26  --  26 26 26 27   GLUCOSE 226*   < > 199*   < > 187* 241*  --  238* 200* 184* 221*  BUN 87*   < > 45*   < > 35* 40*  --  32* 28* 27* 25*  CREATININE 1.56*   < > 0.98   < > 0.83 0.95  --  0.85 0.84 0.83 0.71  CALCIUM 8.2*   < > 8.0*   < > 7.5* 7.8*  --  7.7* 8.1* 7.8* 8.1*  MG 2.6*  --  2.4  --  2.4  --  2.3  --   --   --  2.6*  PHOS 5.0*   < > 3.3   < > 3.0 3.9  --  3.1 3.3 3.3 2.9   < > = values in this interval not displayed.    Liver Function Tests: Recent Labs  Lab 05/28/21 1655 05/29/21 0253 05/29/21 1211 05/29/21 1615 05/30/21 0212  ALBUMIN 1.9* 1.8* 1.9* 1.9* 1.9*   No results for input(s): LIPASE, AMYLASE in the last 168 hours. No results for input(s): AMMONIA in the last 168 hours.  CBC: Recent Labs  Lab  05/26/21 0410 05/27/21 5397 05/28/21 0415 05/29/21 0252 05/30/21 0212  WBC 13.4* 14.3* 14.9* 13.4* 11.8*  NEUTROABS  --   --   --  11.7*  --   HGB 10.3* 10.2* 9.3* 9.2* 9.6*  HCT 33.0* 32.1* 30.3* 29.5* 31.2*  MCV 99.7 99.7 101.0* 102.1* 102.3*  PLT 177 147* 164 188 202    Cardiac Enzymes: No results for input(s): CKTOTAL, CKMB, CKMBINDEX, TROPONINI in the last 168 hours.  BNP: Invalid input(s): POCBNP  CBG: Recent Labs  Lab 05/29/21 1735 05/29/21 2028 05/29/21 2332 05/30/21 0207 05/30/21 0606  GLUCAP 166* 164* 140* 154* 158*    Microbiology: Results for orders placed or performed during the hospital encounter of 04/17/2021  Resp Panel by RT-PCR (Flu A&B, Covid) Nasopharyngeal Swab     Status: None   Collection Time: 04/26/2021 11:18 PM   Specimen: Nasopharyngeal Swab; Nasopharyngeal(NP) swabs in vial transport medium  Result Value Ref Range Status   SARS Coronavirus 2 by RT PCR NEGATIVE NEGATIVE Final    Comment: (NOTE) SARS-CoV-2 target nucleic acids are NOT DETECTED.  The  SARS-CoV-2 RNA is generally detectable in upper respiratory specimens during the acute phase of infection. The lowest concentration of SARS-CoV-2 viral copies this assay can detect is 138 copies/mL. A negative result does not preclude SARS-Cov-2 infection and should not be used as the sole basis for treatment or other patient management decisions. A negative result may occur with  improper specimen collection/handling, submission of specimen other than nasopharyngeal swab, presence of viral mutation(s) within the areas targeted by this assay, and inadequate number of viral copies(<138 copies/mL). A negative result must be combined with clinical observations, patient history, and epidemiological information. The expected result is Negative.  Fact Sheet for Patients:  EntrepreneurPulse.com.au  Fact Sheet for Healthcare Providers:  IncredibleEmployment.be  This test is no t yet approved or cleared by the [place] FDA and  has been authorized for detection and/or diagnosis of SARS-CoV-2 by FDA under an Emergency Use Authorization (EUA). This EUA will remain  in effect (meaning this test can be used) for the duration of the COVID-19 declaration under Section 564(b)(1) of the Act, 21 U.S.C.section 360bbb-3(b)(1), unless the authorization is terminated  or revoked sooner.       Influenza A by PCR NEGATIVE NEGATIVE Final   Influenza B by PCR NEGATIVE NEGATIVE Final    Comment: (NOTE) The Xpert Xpress SARS-CoV-2/FLU/RSV plus assay is intended as an aid in the diagnosis of influenza from Nasopharyngeal swab specimens and should not be used as a sole basis for treatment. Nasal washings and aspirates are unacceptable for Xpert Xpress SARS-CoV-2/FLU/RSV testing.  Fact Sheet for Patients: EntrepreneurPulse.com.au  Fact Sheet for Healthcare Providers: IncredibleEmployment.be  This test is not yet approved or  cleared by the [place] FDA and has been authorized for detection and/or diagnosis of SARS-CoV-2 by FDA under an Emergency Use Authorization (EUA). This EUA will remain in effect (meaning this test can be used) for the duration of the COVID-19 declaration under Section 564(b)(1) of the Act, 21 U.S.C. section 360bbb-3(b)(1), unless the authorization is terminated or revoked.  Performed at [facility], [place] [address] [place], [place] [address]   Culture, blood (routine x 2)     Status: None   Collection Time: 05/09/21  1:16 AM   Specimen: BLOOD LEFT HAND  Result Value Ref Range Status   Specimen Description BLOOD LEFT HAND  Final   Special Requests   Final    BOTTLES  DRAWN AEROBIC ONLY Blood Culture results may not be optimal due to an inadequate volume of blood received in culture bottles   Culture   Final    NO GROWTH 5 DAYS Performed at [facility], [place] [address] [place], [place] [address]    Report Status 05/14/2021 FINAL  Final  Culture, blood (routine x 2)     Status: None   Collection Time: 05/09/21  1:16 AM   Specimen: BLOOD LEFT HAND  Result Value Ref Range Status   Specimen Description BLOOD LEFT HAND  Final   Special Requests   Final    BOTTLES DRAWN AEROBIC ONLY Blood Culture results may not be optimal due to an inadequate volume of blood received in culture bottles   Culture   Final    NO GROWTH 5 DAYS Performed at [facility], [place] [address], [place], [place] [address]    Report Status 05/14/2021 FINAL  Final  MRSA PCR Screening     Status: None   Collection Time: 05/09/21  1:33 AM   Specimen: Nasopharyngeal  Result Value Ref Range Status   MRSA by PCR NEGATIVE NEGATIVE Final    Comment:        The GeneXpert MRSA Assay (FDA approved for NASAL specimens only), is one component of a comprehensive MRSA colonization surveillance program. It is not intended to diagnose MRSA infection nor to guide or monitor treatment for MRSA  infections. Performed at [facility], [place] [address], [place], [place] [address]   Culture, respiratory (tracheal aspirate)     Status: None   Collection Time: 05/09/21  5:07 AM   Specimen: Tracheal Aspirate; Respiratory  Result Value Ref Range Status   Specimen Description TRACHEAL ASPIRATE  Final   Special Requests NONE  Final   Gram Stain   Final    MODERATE WBC PRESENT,BOTH PMN AND MONONUCLEAR FEW GRAM POSITIVE COCCI IN CHAINS    Culture   Final    Normal respiratory flora-no Staph aureus or Pseudomonas seen Performed at [facility], [address] [place], [place] [address]    Report Status 05/11/2021 FINAL  Final  Urine Culture     Status: None   Collection Time: 05/09/21  8:44 PM   Specimen: Urine, Catheterized  Result Value Ref Range Status   Specimen Description URINE, CATHETERIZED  Final   Special Requests NONE  Final   Culture   Final    NO GROWTH Performed at [facility], [place] [address], [place], [place] [address]    Report Status 05/11/2021 FINAL  Final  Culture, Respiratory w Gram Stain     Status: None   Collection Time: 05/17/21  3:23 PM   Specimen: Tracheal Aspirate; Respiratory  Result Value Ref Range Status   Specimen Description TRACHEAL ASPIRATE  Final   Special Requests NONE  Final   Gram Stain   Final    NO WBC SEEN RARE GRAM POSITIVE COCCI RARE YEAST    Culture   Final    Normal respiratory flora-no Staph aureus or Pseudomonas seen Performed at [facility], [address] [place],  [address]    Report Status 05/19/2021 FINAL  Final  Culture, blood (routine x 2)     Status: None   Collection Time: 05/19/21  9:08 AM   Specimen: BLOOD LEFT HAND  Result Value Ref Range Status   Specimen Description BLOOD LEFT HAND  Final   Special Requests  Final    BOTTLES DRAWN AEROBIC ONLY Blood Culture adequate volume   Culture   Final    NO GROWTH 5 DAYS Performed at [facility], [place] [address],  [place], [place] [address]    Report Status 05/24/2021 FINAL  Final  Culture, blood (routine x 2)     Status: None   Collection Time: 05/19/21  9:19 AM   Specimen: BLOOD LEFT HAND  Result Value Ref Range Status   Specimen Description BLOOD LEFT HAND  Final   Special Requests   Final    BOTTLES DRAWN AEROBIC ONLY Blood Culture results may not be optimal due to an inadequate volume of blood received in culture bottles   Culture   Final    NO GROWTH 5 DAYS Performed at [facility], [place] [address] [place], [place] [address]    Report Status 05/24/2021 FINAL  Final  Culture, Urine     Status: None   Collection Time: 05/19/21  7:17 PM   Specimen: Urine, Random  Result Value Ref Range Status   Specimen Description URINE, RANDOM  Final   Special Requests NONE  Final   Culture   Final    NO GROWTH Performed at [facility], [place] [address] [place], [place] [address]    Report Status 05/21/2021 FINAL  Final  MRSA PCR Screening     Status: None   Collection Time: 05/20/21 11:00 AM   Specimen: Nasopharyngeal  Result Value Ref Range Status   MRSA by PCR NEGATIVE NEGATIVE Final    Comment:        The GeneXpert MRSA Assay (FDA approved for NASAL specimens only), is one component of a comprehensive MRSA colonization surveillance program. It is not intended to diagnose MRSA infection nor to guide or monitor treatment for MRSA infections. Performed at [facility], [place] [address], [place], [place] [address]   Culture, blood (routine x 2)     Status: Abnormal   Collection Time: 05/24/21  8:49 AM   Specimen: BLOOD  Result Value Ref Range Status   Specimen Description BLOOD LEFT ANTECUBITAL  Final   Special Requests   Final    BOTTLES DRAWN AEROBIC AND ANAEROBIC Blood Culture adequate volume   Culture  Setup Time   Final    GRAM POSITIVE COCCI IN CLUSTERS ANAEROBIC BOTTLE ONLY CRITICAL RESULT CALLED TO, READ BACK BY AND VERIFIED WITH: PHARMD EMILY SINCLAIR AT 5993 ON  05/25/21 BY KJ    Culture (A)  Final    STAPHYLOCOCCUS EPIDERMIDIS THE SIGNIFICANCE OF ISOLATING THIS ORGANISM FROM A SINGLE SET OF BLOOD CULTURES WHEN MULTIPLE SETS ARE DRAWN IS UNCERTAIN. PLEASE NOTIFY THE MICROBIOLOGY DEPARTMENT WITHIN ONE WEEK IF SPECIATION AND SENSITIVITIES ARE REQUIRED. Performed at [facility], [place] [address] [place], [place] [address]    Report Status 05/27/2021 FINAL  Final  Blood Culture ID Panel (Reflexed)     Status: Abnormal   Collection Time: 05/24/21  8:49 AM  Result Value Ref Range Status   Enterococcus faecalis NOT DETECTED NOT DETECTED Final   Enterococcus Faecium NOT DETECTED NOT DETECTED Final   Listeria monocytogenes NOT DETECTED NOT DETECTED Final   Staphylococcus species DETECTED (A) NOT DETECTED Final    Comment: CRITICAL RESULT CALLED TO, READ BACK BY AND VERIFIED WITH: PHARMD EMILY SINCLAIR AT 1256 BY KJ ON 05/25/21    Staphylococcus aureus (BCID) NOT DETECTED NOT DETECTED Final   Staphylococcus epidermidis DETECTED (A) NOT DETECTED Final  Comment: CRITICAL RESULT CALLED TO, READ BACK BY AND VERIFIED WITH: PHARMD EMILY SINCLAIR AT 1256 BY KJ ON 05/25/21 Methicillin (oxacillin) resistant coagulase negative staphylococcus. Possible blood culture contaminant (unless isolated from more than one blood culture draw or clinical case suggests pathogenicity). No antibiotic treatment is indicated for blood  culture contaminants.    Staphylococcus lugdunensis NOT DETECTED NOT DETECTED Final   Streptococcus species NOT DETECTED NOT DETECTED Final   Streptococcus agalactiae NOT DETECTED NOT DETECTED Final    Comment: CRITICAL RESULT CALLED TO, READ BACK BY AND VERIFIED WITH: PHARMD EMILYH SINCLAIR AT 4132 ON 05/25/21 BY KJ    Streptococcus pneumoniae NOT DETECTED NOT DETECTED Final   Streptococcus pyogenes NOT DETECTED NOT DETECTED Final   A.calcoaceticus-baumannii NOT DETECTED NOT DETECTED Final   Bacteroides fragilis NOT DETECTED NOT DETECTED  Final   Enterobacterales NOT DETECTED NOT DETECTED Final   Enterobacter cloacae complex NOT DETECTED NOT DETECTED Final   Escherichia coli NOT DETECTED NOT DETECTED Final   Klebsiella aerogenes NOT DETECTED NOT DETECTED Final   Klebsiella oxytoca NOT DETECTED NOT DETECTED Final   Klebsiella pneumoniae NOT DETECTED NOT DETECTED Final   Proteus species NOT DETECTED NOT DETECTED Final   Salmonella species NOT DETECTED NOT DETECTED Final   Serratia marcescens NOT DETECTED NOT DETECTED Final   Haemophilus influenzae NOT DETECTED NOT DETECTED Final   Neisseria meningitidis NOT DETECTED NOT DETECTED Final   Pseudomonas aeruginosa NOT DETECTED NOT DETECTED Final   Stenotrophomonas maltophilia NOT DETECTED NOT DETECTED Final   Candida albicans NOT DETECTED NOT DETECTED Final   Candida auris NOT DETECTED NOT DETECTED Final   Candida glabrata NOT DETECTED NOT DETECTED Final   Candida krusei NOT DETECTED NOT DETECTED Final   Candida parapsilosis NOT DETECTED NOT DETECTED Final   Candida tropicalis NOT DETECTED NOT DETECTED Final   Cryptococcus neoformans/gattii NOT DETECTED NOT DETECTED Final   Methicillin resistance mecA/C DETECTED (A) NOT DETECTED Final    Comment: CRITICAL RESULT CALLED TO, READ BACK BY AND VERIFIED WITH: PHARMD EMILY SINCLAIR AT 1256 ON 05/25/21 BY KJ Performed at [facility], [address], [place], [place] [address]   Culture, blood (routine x 2)     Status: None   Collection Time: 05/24/21  8:57 AM   Specimen: BLOOD  Result Value Ref Range Status   Specimen Description BLOOD BLOOD LEFT HAND  Final   Special Requests   Final    BOTTLES DRAWN AEROBIC AND ANAEROBIC Blood Culture adequate volume   Culture   Final    NO GROWTH 5 DAYS Performed at [facility], [place] [address] [place], [place] [address]    Report Status 05/29/2021 FINAL  Final  MRSA Next Gen by PCR, Nasal     Status: None   Collection Time: 05/24/21  9:58 AM  Result Value Ref Range Status    MRSA by PCR Next Gen NOT DETECTED NOT DETECTED Final    Comment: (NOTE) The GeneXpert MRSA Assay (FDA approved for NASAL specimens only), is one component of a comprehensive MRSA colonization surveillance program. It is not intended to diagnose MRSA infection nor to guide or monitor treatment for MRSA infections. Test performance is not FDA approved in patients less than 54 years old. Performed at [facility], [place] [address], [place], [place] [address]   Culture, Respiratory w Gram Stain     Status: None   Collection Time: 05/24/21 11:00 AM   Specimen: Tracheal Aspirate; Respiratory  Result Value  Ref Range Status   Specimen Description TRACHEAL ASPIRATE  Final   Special Requests NONE  Final   Gram Stain   Final    RARE WBC PRESENT, PREDOMINANTLY MONONUCLEAR NO ORGANISMS SEEN    Culture   Final    FEW Normal respiratory flora-no Staph aureus or Pseudomonas seen Performed at [facility], [address], [place], [place] [address]    Report Status 05/26/2021 FINAL  Final    Coagulation Studies: No results for input(s): LABPROT, INR in the last 72 hours.  Urinalysis: No results for input(s): COLORURINE, LABSPEC, PHURINE, GLUCOSEU, HGBUR, BILIRUBINUR, KETONESUR, PROTEINUR, UROBILINOGEN, NITRITE, LEUKOCYTESUR in the last 72 hours.  Invalid input(s): APPERANCEUR    Imaging: No results found.   Medications:     prismasol BGK 4/2.5 500 mL/hr at 05/29/21 2338    prismasol BGK 4/2.5 200 mL/hr at 05/29/21 2136   sodium chloride 10 mL/hr at 05/30/21 0500   sodium chloride     amiodarone 30 mg/hr (05/30/21 0500)   dexmedetomidine (PRECEDEX) IV infusion 0.4 mcg/kg/hr (05/30/21 0520)   feeding supplement (VITAL 1.5 CAL) 1,000 mL (05/29/21 1541)   heparin 2,050 Units/hr (05/30/21 0513)   insulin 2 Units/hr (05/30/21 0513)   meropenem (MERREM) IV Stopped (05/30/21 0222)   norepinephrine (LEVOPHED) Adult infusion 10 mcg/min (05/30/21 0500)   prismasol BGK 4/2.5 2,000  mL/hr at 05/29/21 2334    arformoterol  15 mcg Nebulization BID   aspirin  81 mg Per Tube Daily   atorvastatin  80 mg Per Tube Daily   budesonide (PULMICORT) nebulizer solution  0.5 mg Nebulization BID   chlorhexidine gluconate (MEDLINE KIT)  15 mL Mouth Rinse BID   Chlorhexidine Gluconate Cloth  6 each Topical Q0600   clonazePAM  0.5 mg Per Tube BID   feeding supplement (PROSource TF)  90 mL Per Tube TID   levothyroxine  50 mcg Per Tube Q0600   mouth rinse  15 mL Mouth Rinse 10 times per day   midodrine  20 mg Per Tube TID   pantoprazole sodium  40 mg Per Tube BID   revefenacin  175 mcg Nebulization Daily   sodium chloride flush  10-40 mL Intracatheter Q12H   [CANCELED] Place/Maintain arterial line **AND** sodium chloride, acetaminophen (TYLENOL) oral liquid 160 mg/5 mL, dextrose, docusate, fentaNYL (SUBLIMAZE) injection, heparin, metoprolol tartrate, mineral oil-hydrophilic petrolatum, polyethylene glycol, senna, sodium chloride, sodium chloride flush  Assessment/ Plan:  Acute kidney injury Baseline serum creatinine 1.5 mg/dL.  Was initiated on CRRT therapy 05/25/2021.  Continues to be oliguric.  Negative fluid balance about 75 cc an hour per chart.  Continues on a 4K bath, no heparin( systemic gtt.). Acute on chronic systolic heart failure massive volume overload Shock Levophed, midodrine, vancomycin, meropenem Atrial fibrillation/flutter amiodarone, heparin Diabetes mellitus on insulin sliding scale Hypokalemia resolved Hypoxic respiratory failure management per critical care service   LOS: [place] @TODAY @6 :23 AM

## 2021-05-30 NOTE — Progress Notes (Addendum)
Patient ID: DANNE VASEK, male   DOB: 07-11-1946, 75 y.o.   MRN: [id number]    Progress Note from the Palliative Medicine Team at [facility]   Patient Name: DAEKWON BESWICK        Date: 05/30/2021 DOB: [birth year]  Age: 75 y.o. MRN#: [id number] Attending Physician: [name], MD Primary Care Physician: [name], MD Admit Date: 04/12/2021   Medical records reviewed   75 y.o. male  with past medical history of morbid obesity, heart failure with reduced ejection fraction (baseline 40%), CPOD, ventricular tachycardia, coronary artery disease, left bundle branch with, AAA, non-small cell lung cancer status post XRT, BPH, diabetes type 2, peripheral vascular disease  admitted on 04/21/2021 with Respiratory failure, CHF exacerbation, COPD exacerbation.   During this admission the patient developed shock, suspected combination septic shock and cardiogenic shock, sepsis, acute on chronic systolic heart failure, A. fib with RVR now status post DCCV, acute kidney injury on CKD requiring CRRT, pulmonary edema, fluid overload, thrombocytopenia (now resolved).   This NP visited patient at the bedside as a follow up to  yesterday's [place].   I met at the bedside with the patient's family to include his wife, daughter, son and granddaughter and friend.   Today is 20-day 21 of this hospitalization and unfortunately Mr. Griffing his not responded to full medical support as hoped.  Family face decisions regarding continued full scope path including trach and PEG versus one-way extubation, continuing all other medical interventions hoping that patient would thrive on his own off the ventilator.  Decision made today for one-way extubation, ultimately comfort and dignity are priority.  All understand the seriousness of the patient's current medical situation.  Emotional support offered.  Discussed with family the importance of continued conversation with each other and their  medical providers  regarding overall plan of care and treatment options,  ensuring decisions are within the context of the patients values and GOCs.   Later in the afternoon I followed up with family at the bedside.  Patient is off the ventilator however he has difficulty clearing his secretions.  I expressed my concern to family my worry that he will not be able to sustain himself from a pulmonary standpoint for any length of time secondary to weakness, fluids and secretions.  Symptom management -Fentanyl 50 mcg IV every hour as needed for pain or shortness of breath -Robinul 0.2 mg IV 4 times daily for secretions  If patient decompensates, plan is to focus on comfort and allow for a natural death  Education offered to family regarding the difference between an aggressive medical intervention path and a palliative comfort path for this patient at this time in this situation.  PMT will continue to support holistically   Questions and concerns addressed   Discussed with Dr Lynetta Mare  Total time spent on the unit was 60 minutes   Greater than 50% of the time was spent in counseling and coordination of care  Wadie Lessen NP  Palliative Medicine Team Team Phone # [phone] Pager [phone]

## 2021-05-31 ENCOUNTER — Ambulatory Visit: Payer: Medicare Other | Admitting: Internal Medicine

## 2021-05-31 DIAGNOSIS — N179 Acute kidney failure, unspecified: Secondary | ICD-10-CM | POA: Diagnosis not present

## 2021-05-31 DIAGNOSIS — R092 Respiratory arrest: Secondary | ICD-10-CM | POA: Diagnosis not present

## 2021-05-31 DIAGNOSIS — I509 Heart failure, unspecified: Secondary | ICD-10-CM

## 2021-05-31 DIAGNOSIS — R579 Shock, unspecified: Secondary | ICD-10-CM | POA: Diagnosis not present

## 2021-05-31 DIAGNOSIS — J9621 Acute and chronic respiratory failure with hypoxia: Secondary | ICD-10-CM

## 2021-05-31 LAB — RENAL FUNCTION PANEL
Albumin: 2.2 g/dL — ABNORMAL LOW (ref 3.5–5.0)
Albumin: 2.2 g/dL — ABNORMAL LOW (ref 3.5–5.0)
Anion gap: 13 (ref 5–15)
Anion gap: 5 (ref 5–15)
BUN: 22 mg/dL (ref 8–23)
BUN: 31 mg/dL — ABNORMAL HIGH (ref 8–23)
CO2: 24 mmol/L (ref 22–32)
CO2: 27 mmol/L (ref 22–32)
Calcium: 8.6 mg/dL — ABNORMAL LOW (ref 8.9–10.3)
Calcium: 8.6 mg/dL — ABNORMAL LOW (ref 8.9–10.3)
Chloride: 100 mmol/L (ref 98–111)
Chloride: 103 mmol/L (ref 98–111)
Creatinine, Ser: 0.88 mg/dL (ref 0.61–1.24)
Creatinine, Ser: 0.93 mg/dL (ref 0.61–1.24)
GFR, Estimated: >60 mL/min (ref >60)
GFR, Estimated: >60 mL/min (ref >60)
Glucose, Bld: 170 mg/dL — ABNORMAL HIGH (ref 70–99)
Glucose, Bld: 113 mg/dL — ABNORMAL HIGH (ref 70–99)
Phosphorus: 3.2 mg/dL (ref 2.5–4.6)
Phosphorus: 2.8 mg/dL (ref 2.5–4.6)
Potassium: 5.1 mmol/L (ref 3.5–5.1)
Potassium: 4.6 mmol/L (ref 3.5–5.1)
Sodium: 137 mmol/L (ref 135–145)
Sodium: 135 mmol/L (ref 135–145)

## 2021-05-31 LAB — GLUCOSE, CAPILLARY
Glucose-Capillary: 104 mg/dL — ABNORMAL HIGH (ref 70–99)
Glucose-Capillary: 112 mg/dL — ABNORMAL HIGH (ref 70–99)
Glucose-Capillary: 122 mg/dL — ABNORMAL HIGH (ref 70–99)
Glucose-Capillary: 130 mg/dL — ABNORMAL HIGH (ref 70–99)
Glucose-Capillary: 148 mg/dL — ABNORMAL HIGH (ref 70–99)
Glucose-Capillary: 185 mg/dL — ABNORMAL HIGH (ref 70–99)

## 2021-05-31 LAB — CBC
HCT: 31.3 % — ABNORMAL LOW (ref 39.0–52.0)
Hemoglobin: 9.4 g/dL — ABNORMAL LOW (ref 13.0–17.0)
MCH: 30.9 pg (ref 26.0–34.0)
MCHC: 30 g/dL (ref 30.0–36.0)
MCV: 103 fL — ABNORMAL HIGH (ref 80.0–100.0)
Platelets: 194 10*3/uL (ref 150–400)
RBC: 3.04 MIL/uL — ABNORMAL LOW (ref 4.22–5.81)
RDW: 15.8 % — ABNORMAL HIGH (ref 11.5–15.5)
WBC: 11.2 10*3/uL — ABNORMAL HIGH (ref 4.0–10.5)
nRBC: 0 % (ref 0.0–0.2)

## 2021-05-31 LAB — HEPARIN LEVEL (UNFRACTIONATED): Heparin Unfractionated: 0.42 IU/mL (ref 0.30–0.70)

## 2021-05-31 LAB — COOXEMETRY PANEL
Carboxyhemoglobin: 1 % (ref 0.5–1.5)
Methemoglobin: 1 % (ref 0.0–1.5)
O2 Saturation: 66.5 %
Total hemoglobin: 11 g/dL — ABNORMAL LOW (ref 12.0–16.0)

## 2021-05-31 LAB — MAGNESIUM: Magnesium: 2.6 mg/dL — ABNORMAL HIGH (ref 1.7–2.4)

## 2021-05-31 MED ORDER — JUVEN PO PACK: 1.0000 | PACK | Freq: Two times a day (BID) | ORAL | Status: DC

## 2021-05-31 MED ORDER — AMIODARONE HCL 200 MG PO TABS
200.0000 mg | ORAL_TABLET | Freq: Every day | ORAL | Status: DC
  Administered 2021-05-31 – 2021-06-01 (×2): 200 mg
  Filled 2021-05-31 (×2): qty 1

## 2021-05-31 MED ORDER — JUVEN PO PACK
1.0000 | PACK | Freq: Two times a day (BID) | ORAL | Status: DC
  Administered 2021-05-31 – 2021-06-01 (×2): 1
  Filled 2021-05-31 (×2): qty 1

## 2021-05-31 MED ORDER — B COMPLEX-C PO TABS
1.0000 | ORAL_TABLET | Freq: Every day | ORAL | Status: DC
  Administered 2021-05-31 – 2021-06-01 (×2): 1
  Filled 2021-05-31 (×2): qty 1

## 2021-05-31 MED ORDER — ATORVASTATIN CALCIUM 10 MG PO TABS
10.0000 mg | ORAL_TABLET | Freq: Every day | ORAL | Status: DC
  Administered 2021-06-01: 10 mg
  Filled 2021-05-31: qty 1

## 2021-05-31 NOTE — Progress Notes (Addendum)
Patient ID: ART LEVAN, male   DOB: 17-Nov-1946, 75 y.o.   MRN: [id number]     Advanced Heart Failure Rounding Note  PCP-Cardiologist: None   Subjective:    Underwent DCCV 6/13. Maintaining NSR.  6/20 one way extubation . Antibiotics stopped .Norepi was stopped.   Pressures stable. On room air with stable sats.  On CVVHD. Keeping even.   Remains on amio drip.   Family meeting 6/19 patient would not want trach or long-term HD  TEE (6/13): EF 20-25%, mildly decreased RV systolic function   Objective:   Weight Range: 128.3 kg Body mass index is 44.3 kg/m.   Vital Signs:   Temp:  [97.3 F (36.3 C)-98.4 F (36.9 C)] 97.3 F (36.3 C) (06/21 0817) Pulse Rate:  [69-92] 74 (06/21 0800) Resp:  [15-26] 16 (06/21 0800) BP: (95-197)/(50-169) 140/62 (06/21 0800) SpO2:  [92 %-100 %] 97 % (06/21 0808) Last BM Date: 05/31/21  Weight change: Filed Weights   05/26/21 0431 05/27/21 0126 05/28/21 0215  Weight: 134.4 kg 131.7 kg 128.3 kg    Intake/Output:   Intake/Output Summary (Last 24 hours) at 05/31/2021 1100 Last data filed at 05/31/2021 1000 Gross per 24 hour  Intake 1550.26 ml  Output 2667 ml  Net -1116.74 ml      Physical Exam   CVP 6-7  General:  Appears weak. No resp difficulty HEENT: normal Neck: supple. no JVD. Carotids 2+ bilat; no bruits. No lymphadenopathy or thryomegaly appreciated. RIJ  Cor: PMI nondisplaced. Regular rate & rhythm. No rubs, gallops or murmurs. Lungs: Coarse throughout. On room air.  Abdomen: obese, soft, nontender, nondistended. No hepatosplenomegaly. No bruits or masses. Good bowel sounds. Extremities: no cyanosis, clubbing, rash, edema Neuro: Opens eyes. Moves RUE/LUE weak.  GU: foley   Telemetry    BiV paced S- V Paced 70-80s    Labs    CBC Recent Labs    05/29/21 0252 05/30/21 0212 05/31/21 0415  WBC 13.4* 11.8* 11.2*  NEUTROABS 11.7*  --   --   HGB 9.2* 9.6* 9.4*  HCT 29.5* 31.2* 31.3*  MCV 102.1* 102.3* 103.0*   PLT 188 202 329   Basic Metabolic Panel Recent Labs    05/30/21 0212 05/30/21 1617 05/31/21 0415  NA 136 135 137  K 4.6 5.2* 5.1  CL 103 101 100  CO2 _0 GLUCOSE 221* 178* 170*  BUN 25* 22 22  CREATININE 0.71 0.79 0.88  CALCIUM 8.1* 8.5* 8.6*  MG 2.6*  --  2.6*  PHOS 2.9 3.2 3.2   Liver Function Tests Recent Labs    05/30/21 1617 05/31/21 0415  ALBUMIN 2.2* 2.2*   No results for input(s): LIPASE, AMYLASE in the last 72 hours. Cardiac Enzymes No results for input(s): CKTOTAL, CKMB, CKMBINDEX, TROPONINI in the last 72 hours.  BNP: BNP (last 3 results) Recent Labs    04/23/2021 2322 05/14/21 0500 05/16/21 0340  BNP 281.2* 216.4* 78.5    ProBNP (last 3 results) No results for input(s): PROBNP in the last 8760 hours.   D-Dimer No results for input(s): DDIMER in the last 72 hours. Hemoglobin A1C No results for input(s): HGBA1C in the last 72 hours. Fasting Lipid Panel No results for input(s): CHOL, HDL, LDLCALC, TRIG, CHOLHDL, LDLDIRECT in the last 72 hours. Thyroid Function Tests No results for input(s): TSH, T4TOTAL, T3FREE, THYROIDAB in the last 72 hours.  Invalid input(s): FREET3  Other results:   Imaging    DG Abd 1 View  Result  Date: 05/30/2021 CLINICAL DATA:  Nasogastric tube placement. EXAM: ABDOMEN - 1 VIEW COMPARISON:  None. FINDINGS: Enteric tube with tip overlying the expected region of the gastric lumen and side port in the region of the gastroesophageal junction. The bowel gas pattern is normal. No radio-opaque calculi or other significant radiographic abnormality are seen. Aorto bi-iliac stent graft noted. Partially visualized cardiac device leads. IMPRESSION: Enteric tube with tip overlying the expected region of the gastric lumen and side port in the region of the gastroesophageal junction. If side-port desired within the gastric lumen, consider advancing by 3 cm. Electronically Signed   By: Iven Finn M.D.   On: 05/30/2021 22:51    DG CHEST PORT 1 VIEW  Result Date: 05/30/2021 CLINICAL DATA:  Feeding tube placement EXAM: PORTABLE CHEST 1 VIEW COMPARISON:  05/28/2021, 05/11/2021, CT chest 12/22/2020 FINDINGS: Endotracheal tube is not identified. Post sternotomy changes left-sided pacing device. Right IJ catheter sheath over the SVC origin. Esophageal tube tip overlies the proximal stomach.stable architectural distortion and spiculated opacities in the upper lobes. Patchy airspace disease at the left base. Cardiomegaly. Right upper extremity central venous catheter tip over the SVC. IMPRESSION: 1. Removal of endotracheal tube. Esophageal tube tip below the diaphragm, likely over proximal gastric region 2. Little change in patchy left lung base airspace disease. Similar architectural distortion and spiculated opacities in the upper lobes corresponding to findings on chest CT from January. 3. Cardiomegaly Electronically Signed   By: Donavan Foil M.D.   On: 05/30/2021 22:47     Medications:     Scheduled Medications:  amiodarone  200 mg Per Tube Daily   arformoterol  15 mcg Nebulization BID   aspirin  81 mg Per Tube Daily   [START ON 06/01/2021] atorvastatin  10 mg Per Tube Daily   budesonide (PULMICORT) nebulizer solution  0.5 mg Nebulization BID   chlorhexidine gluconate (MEDLINE KIT)  15 mL Mouth Rinse BID   Chlorhexidine Gluconate Cloth  6 each Topical Q0600   feeding supplement (PROSource TF)  90 mL Per Tube TID   glycopyrrolate  0.2 mg Intravenous QID   insulin aspart  1-3 Units Subcutaneous Q4H   insulin aspart  7 Units Subcutaneous Q4H   insulin detemir  22 Units Subcutaneous Q12H   levothyroxine  50 mcg Per Tube Q0600   midodrine  20 mg Per Tube TID   pantoprazole sodium  40 mg Per Tube BID   revefenacin  175 mcg Nebulization Daily   sodium chloride flush  10-40 mL Intracatheter Q12H    Infusions:   prismasol BGK 4/2.5 500 mL/hr at 05/31/21 0424    prismasol BGK 4/2.5 200 mL/hr at 05/30/21 2300   sodium  chloride Stopped (05/31/21 0857)   sodium chloride     dextrose     feeding supplement (VITAL 1.5 CAL) 1,000 mL (05/29/21 1541)   heparin 2,050 Units/hr (05/31/21 1000)   norepinephrine (LEVOPHED) Adult infusion Stopped (05/30/21 1550)   prismasol BGK 4/2.5 2,000 mL/hr at 05/31/21 1040    PRN Medications: [CANCELED] Place/Maintain arterial line **AND** sodium chloride, acetaminophen (TYLENOL) oral liquid 160 mg/5 mL, dextrose, docusate, fentaNYL (SUBLIMAZE) injection, heparin, LORazepam, metoprolol tartrate, mineral oil-hydrophilic petrolatum, polyethylene glycol, senna, sodium chloride, sodium chloride flush  Assessment/Plan    Shock: Suspect combination of septic shock and cardiogenic shock in setting of recurrent AF.  Echo 05/12/21 EF 20-25% (previous echo EF 35-40%). Bcx NGTD.  Resp cx - rare GPC.  Atrial flutter/RVR likely played a role. S/p DCCV to NSR  6/13 (TEE with EF 20-25%, mild RV dysfunction). Suspect significant vasodilatory/septic shock component predominating. Covered with meropenem (completed 6/20) - Off norepi . CO-OX 66.5%.  - Off abx 6/20   - CCM managing 2. 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 Echo 05/12/21 EF 20-25% (previous Echo EF 35-40%).  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 Last cath 1/19 (LAD is patent, other vessels occluded with collaterals from the LAD; SVG to OM and SVG to PDA chronically occluded).  He had progressive renal failure, now on CVVH.  Volume status improved. Remains on CVVHD  - Volume status much improved.  - Off norepi. Co-ox 66.5 %.  - Doubt ischemia is driving factor. No role for cath in setting of renal failure and poor prognosis  3. Recurrent AF/AFL with RVR: H/o DCCV in 12/21. S/p DCCV 6/13. Remains in NSR.  - Switching amio drip to po amio 200 mg daily.  - Continue heparin gtt. Watch for recurrent BRBPR 4. Acute hypercapenic/hypoxic  respiratory failure: H/o COPD. Remains on vent since 5/29, failed SBT.  Need to try to pull off fluid via CVVH and get him extubated.  Would be poor candidate for trach given his underlying problems. Family does not want trach  - 6/20  one-way extubation.  - Now on room air.  -CCM managing. 5. AKI on CKD IV: Baseline creatinine 1.3-1.7.  With AKI and volume overload, CVVH begun.  - Remains on CVVHD. Volume removal per Renal Would not be a good long-term HD candidate.  6. CAD: h/o CABG with occluded grafts.  Last cath 1/19 (LAD is patent, other vessels occluded with collaterals from the LAD; SVG to OM and SVG to PDA chronically occluded) - continue ASA/statin - Doubt ischemia is driving factor. No role for cath in setting of renal failure and poor prognosis  7. Thrombocytopenia: Resolved. - HIT negative 8. DM2 - per CCM  9. Urinary retention/hematuria: Foley had to be placed by Urology.  Suspect local Foley trauma.  10. COPD: Moderate to severe.  11. 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  12. Lung cancer: Had radiation.  13.  PAD: Occluded left SFA and significant right SFA stenosis on last CTA.  ABIs in 3/20 with significant disease bilaterally but stable.  14. ID: As above, concerned for septic shock component. Staph epi blood culture 1/4, likely contaminant.  - 6/20 Meropenem stopped.   15. Neuro: Follows some commands.    [facility] following.  Amy Clegg NP-C  05/31/2021 11:00 AM  Agree with above.   Extubated yesterday. Respiratory status stable. Awake but not following commands for me. On CVVHD keeping even. Remains in NSR on IV amio   General:  Lying in bed. Awake. Not tracking or following commands HEENT: normal Neck: supple. no JVD. Carotids 2+ bilat; no bruits. No lymphadenopathy or thryomegaly appreciated. Cor: PMI nondisplaced. Regular rate & rhythm. No rubs, gallops or murmurs. Lungs: coarse Abdomen: obese soft, nontender, nondistended. No hepatosplenomegaly. No bruits or  masses. Good bowel sounds. Extremities: no cyanosis, clubbing, rash, tr edema Neuro: as above  Now extubated but still with MSOF and encephalopathy. D/w CCM. Will wean all sedatives and reassess. Will continue CVVHD today but will need to wean soon. He would not want long-term HD. If kidneys do not recover or his respiratory status worsens -> low threshold to switch to comfort care.   CRITICAL CARE Performed by: [name]  Total critical care  time: 35 minutes  Critical care time was exclusive of separately billable procedures and treating other patients.  Critical care was necessary to treat or prevent imminent or life-threatening deterioration.  Critical care was time spent personally by me (independent of midlevel providers or residents) on the following activities: development of treatment plan with patient and/or surrogate as well as nursing, discussions with consultants, evaluation of patient's response to treatment, examination of patient, obtaining history from patient or surrogate, ordering and performing treatments and interventions, ordering and review of laboratory studies, ordering and review of radiographic studies, pulse oximetry and re-evaluation of patient's condition.  [name], MD  11:55 AM

## 2021-05-31 NOTE — Plan of Care (Signed)
  Problem: Education: Goal: Knowledge of General Education information will improve Description: Including pain rating scale, medication(s)/side effects and non-pharmacologic comfort measures Outcome: Progressing   Problem: Clinical Measurements: Goal: Ability to maintain clinical measurements within normal limits will improve Outcome: Progressing Goal: Will remain free from infection Outcome: Progressing Goal: Diagnostic test results will improve Outcome: Progressing Goal: Respiratory complications will improve Outcome: Progressing Goal: Cardiovascular complication will be avoided Outcome: Progressing   Problem: Activity: Goal: Risk for activity intolerance will decrease Outcome: Progressing   Problem: Nutrition: Goal: Adequate nutrition will be maintained Outcome: Progressing   Problem: Coping: Goal: Level of anxiety will decrease Outcome: Progressing   Problem: Elimination: Goal: Will not experience complications related to bowel motility Outcome: Progressing Goal: Will not experience complications related to urinary retention Outcome: Progressing   Problem: Pain Managment: Goal: General experience of comfort will improve Outcome: Progressing   Problem: Safety: Goal: Ability to remain free from injury will improve Outcome: Progressing   Problem: Skin Integrity: Goal: Risk for impaired skin integrity will decrease Outcome: Progressing   Problem: Activity: Goal: Ability to tolerate increased activity will improve Outcome: Progressing   Problem: Respiratory: Goal: Ability to maintain a clear airway and adequate ventilation will improve Outcome: Progressing   Problem: Role Relationship: Goal: Method of communication will improve Outcome: Progressing

## 2021-05-31 NOTE — Progress Notes (Signed)
[facility] ROUNDING NOTE   Subjective:   Interval History: This is a 75 year old pleasant gentleman who was admitted with respiratory distress 04/18/2021 in the emergency room he required intubation he was unresponsive hypotensive.  He started on pressors and broad-spectrum antibiotics.  His baseline serum creatinine about 1.5 mg/dL and was started on CRRT 05/25/2021.  Blood pressure 125/45 pulse 75 temperature 97.3 O2 sats 93% 3 L nasal cannula.  Negative fluid balance 75 cc an hour.  Sodium 137 potassium 5.1 chloride 100 CO2 24 glucose 170 BUN 22 creatinine 0.88 calcium 8.6 phosphorus 3.2 magnesium 2.6 hemoglobin 9.4    Objective:  Vital signs in last 24 hours:  Temp:  [97.3 F (36.3 C)-98.4 F (36.9 C)] 97.3 F (36.3 C) (06/21 0817) Pulse Rate:  [69-92] 74 (06/21 0800) Resp:  [15-26] 16 (06/21 0800) BP: (95-197)/(50-169) 140/62 (06/21 0800) SpO2:  [92 %-100 %] 97 % (06/21 0808)  Weight change:  Filed Weights   05/26/21 0431 05/27/21 0126 05/28/21 0215  Weight: 134.4 kg 131.7 kg 128.3 kg    Intake/Output: I/O last 3 completed shifts: In: 3630.3 [I.V.:2060.5; NG/GT:1365; IV Piggyback:204.8] Out: 5009 [Urine:295; FGHWE:9937; Stool:50]   Intake/Output this shift:  Total I/O In: 418 [I.V.:168; NG/GT:250] Out: 41 [Other:476]  GEN: NAD,lying in bed awake HEENT sclera anicteric NECK + JVD PULM coarse rhonchi throughout anteriorly, L > R CV RRR ABD soft, mildly distended with abd wall edema EXT 2+ anasarca with areas of weeping, slightly improved NEURO intubated and sedated ACCESS: R IJ nontunneled HD cath   Basic Metabolic Panel: Recent Labs  Lab 05/27/21 0442 05/27/21 1600 05/28/21 0415 05/28/21 1655 05/29/21 0252 05/29/21 0253 05/29/21 1211 05/29/21 1615 05/30/21 0212 05/30/21 1617 05/31/21 0415  NA 137   < > 137   < >  --    < > 136 134* 136 135 137  K 4.2   < > 4.3   < >  --    < > 4.5 4.6 4.6 5.2* 5.1  CL 102   < > 104   < >  --    < >  103 103 103 101 100  CO2 25   < > 25   < >  --    < > _0 GLUCOSE 199*   < > 187*   < >  --    < > 200* 184* 221* 178* 170*  BUN 45*   < > 35*   < >  --    < > 28* 27* 25* 22 22  CREATININE 0.98   < > 0.83   < >  --    < > 0.84 0.83 0.71 0.79 0.88  CALCIUM 8.0*   < > 7.5*   < >  --    < > 8.1* 7.8* 8.1* 8.5* 8.6*  MG 2.4  --  2.4  --  2.3  --   --   --  2.6*  --  2.6*  PHOS 3.3   < > 3.0   < >  --    < > 3.3 3.3 2.9 3.2 3.2   < > = values in this interval not displayed.     Liver Function Tests: Recent Labs  Lab 05/29/21 1211 05/29/21 1615 05/30/21 0212 05/30/21 1617 05/31/21 0415  ALBUMIN 1.9* 1.9* 1.9* 2.2* 2.2*    No results for input(s): LIPASE, AMYLASE in the last 168 hours. No results for input(s): AMMONIA in the last 168 hours.  CBC: Recent Labs  Lab 05/27/21 0623 05/28/21 0415 05/29/21 0252 05/30/21 0212 05/31/21 0415  WBC 14.3* 14.9* 13.4* 11.8* 11.2*  NEUTROABS  --   --  11.7*  --   --   HGB 10.2* 9.3* 9.2* 9.6* 9.4*  HCT 32.1* 30.3* 29.5* 31.2* 31.3*  MCV 99.7 101.0* 102.1* 102.3* 103.0*  PLT 147* 164 188 202 194     Cardiac Enzymes: No results for input(s): CKTOTAL, CKMB, CKMBINDEX, TROPONINI in the last 168 hours.  BNP: Invalid input(s): POCBNP  CBG: Recent Labs  Lab 05/30/21 1608 05/30/21 2016 05/30/21 2332 05/31/21 0413 05/31/21 0815  GLUCAP 131* 90 98 185* 104*     Microbiology: Results for orders placed or performed during the hospital encounter of 05/04/2021  Resp Panel by RT-PCR (Flu A&B, Covid) Nasopharyngeal Swab     Status: None   Collection Time: 05/01/2021 11:18 PM   Specimen: Nasopharyngeal Swab; Nasopharyngeal(NP) swabs in vial transport medium  Result Value Ref Range Status   SARS Coronavirus 2 by RT PCR NEGATIVE NEGATIVE Final    Comment: (NOTE) SARS-CoV-2 target nucleic acids are NOT DETECTED.  The SARS-CoV-2 RNA is generally detectable in upper respiratory specimens during the acute phase of infection. The  lowest concentration of SARS-CoV-2 viral copies this assay can detect is 138 copies/mL. A negative result does not preclude SARS-Cov-2 infection and should not be used as the sole basis for treatment or other patient management decisions. A negative result may occur with  improper specimen collection/handling, submission of specimen other than nasopharyngeal swab, presence of viral mutation(s) within the areas targeted by this assay, and inadequate number of viral copies(<138 copies/mL). A negative result must be combined with clinical observations, patient history, and epidemiological information. The expected result is Negative.  Fact Sheet for Patients:  EntrepreneurPulse.com.au  Fact Sheet for Healthcare Providers:  IncredibleEmployment.be  This test is no t yet approved or cleared by the [place] FDA and  has been authorized for detection and/or diagnosis of SARS-CoV-2 by FDA under an Emergency Use Authorization (EUA). This EUA will remain  in effect (meaning this test can be used) for the duration of the COVID-19 declaration under Section 564(b)(1) of the Act, 21 U.S.C.section 360bbb-3(b)(1), unless the authorization is terminated  or revoked sooner.       Influenza A by PCR NEGATIVE NEGATIVE Final   Influenza B by PCR NEGATIVE NEGATIVE Final    Comment: (NOTE) The Xpert Xpress SARS-CoV-2/FLU/RSV plus assay is intended as an aid in the diagnosis of influenza from Nasopharyngeal swab specimens and should not be used as a sole basis for treatment. Nasal washings and aspirates are unacceptable for Xpert Xpress SARS-CoV-2/FLU/RSV testing.  Fact Sheet for Patients: EntrepreneurPulse.com.au  Fact Sheet for Healthcare Providers: IncredibleEmployment.be  This test is not yet approved or cleared by the [place] FDA and has been authorized for detection and/or diagnosis of SARS-CoV-2 by FDA under  an Emergency Use Authorization (EUA). This EUA will remain in effect (meaning this test can be used) for the duration of the COVID-19 declaration under Section 564(b)(1) of the Act, 21 U.S.C. section 360bbb-3(b)(1), unless the authorization is terminated or revoked.  Performed at [facility], [place] [address], [place], [place] [address]   Culture, blood (routine x 2)     Status: None   Collection Time: 05/09/21  1:16 AM   Specimen: BLOOD LEFT HAND  Result Value Ref Range Status   Specimen Description BLOOD LEFT HAND  Final   Special Requests  Final    BOTTLES DRAWN AEROBIC ONLY Blood Culture results may not be optimal due to an inadequate volume of blood received in culture bottles   Culture   Final    NO GROWTH 5 DAYS Performed at [facility], [place] [address] [place], [place] [address]    Report Status 05/14/2021 FINAL  Final  Culture, blood (routine x 2)     Status: None   Collection Time: 05/09/21  1:16 AM   Specimen: BLOOD LEFT HAND  Result Value Ref Range Status   Specimen Description BLOOD LEFT HAND  Final   Special Requests   Final    BOTTLES DRAWN AEROBIC ONLY Blood Culture results may not be optimal due to an inadequate volume of blood received in culture bottles   Culture   Final    NO GROWTH 5 DAYS Performed at [facility], [place] [address], [place], [place] [address]    Report Status 05/14/2021 FINAL  Final  MRSA PCR Screening     Status: None   Collection Time: 05/09/21  1:33 AM   Specimen: Nasopharyngeal  Result Value Ref Range Status   MRSA by PCR NEGATIVE NEGATIVE Final    Comment:        The GeneXpert MRSA Assay (FDA approved for NASAL specimens only), is one component of a comprehensive MRSA colonization surveillance program. It is not intended to diagnose MRSA infection nor to guide or monitor treatment for MRSA infections. Performed at [facility], [place] [address], [place], [place] [address]   Culture, respiratory (tracheal  aspirate)     Status: None   Collection Time: 05/09/21  5:07 AM   Specimen: Tracheal Aspirate; Respiratory  Result Value Ref Range Status   Specimen Description TRACHEAL ASPIRATE  Final   Special Requests NONE  Final   Gram Stain   Final    MODERATE WBC PRESENT,BOTH PMN AND MONONUCLEAR FEW GRAM POSITIVE COCCI IN CHAINS    Culture   Final    Normal respiratory flora-no Staph aureus or Pseudomonas seen Performed at [facility], [address] [place], [place] [address]    Report Status 05/11/2021 FINAL  Final  Urine Culture     Status: None   Collection Time: 05/09/21  8:44 PM   Specimen: Urine, Catheterized  Result Value Ref Range Status   Specimen Description URINE, CATHETERIZED  Final   Special Requests NONE  Final   Culture   Final    NO GROWTH Performed at [facility], [place] [address], [place], [place] [address]    Report Status 05/11/2021 FINAL  Final  Culture, Respiratory w Gram Stain     Status: None   Collection Time: 05/17/21  3:23 PM   Specimen: Tracheal Aspirate; Respiratory  Result Value Ref Range Status   Specimen Description TRACHEAL ASPIRATE  Final   Special Requests NONE  Final   Gram Stain   Final    NO WBC SEEN RARE GRAM POSITIVE COCCI RARE YEAST    Culture   Final    Normal respiratory flora-no Staph aureus or Pseudomonas seen Performed at [facility], [address], [place], [place] [address]    Report Status 05/19/2021 FINAL  Final  Culture, blood (routine x 2)     Status: None   Collection Time: 05/19/21  9:08 AM   Specimen: BLOOD LEFT HAND  Result Value Ref Range Status   Specimen Description BLOOD LEFT HAND  Final  Special Requests   Final    BOTTLES DRAWN AEROBIC ONLY Blood Culture adequate volume   Culture   Final    NO GROWTH 5 DAYS Performed at [facility], [place] [address], [place], [place] [address]    Report Status 05/24/2021 FINAL  Final  Culture, blood (routine x 2)     Status: None   Collection Time:  05/19/21  9:19 AM   Specimen: BLOOD LEFT HAND  Result Value Ref Range Status   Specimen Description BLOOD LEFT HAND  Final   Special Requests   Final    BOTTLES DRAWN AEROBIC ONLY Blood Culture results may not be optimal due to an inadequate volume of blood received in culture bottles   Culture   Final    NO GROWTH 5 DAYS Performed at [facility], [place] [address] [place], [place] [address]    Report Status 05/24/2021 FINAL  Final  Culture, Urine     Status: None   Collection Time: 05/19/21  7:17 PM   Specimen: Urine, Random  Result Value Ref Range Status   Specimen Description URINE, RANDOM  Final   Special Requests NONE  Final   Culture   Final    NO GROWTH Performed at [facility], [place] [address], [place], [place] [address]    Report Status 05/21/2021 FINAL  Final  MRSA PCR Screening     Status: None   Collection Time: 05/20/21 11:00 AM   Specimen: Nasopharyngeal  Result Value Ref Range Status   MRSA by PCR NEGATIVE NEGATIVE Final    Comment:        The GeneXpert MRSA Assay (FDA approved for NASAL specimens only), is one component of a comprehensive MRSA colonization surveillance program. It is not intended to diagnose MRSA infection nor to guide or monitor treatment for MRSA infections. Performed at [facility], [place] [address] [place], [place] [address]   Culture, blood (routine x 2)     Status: Abnormal   Collection Time: 05/24/21  8:49 AM   Specimen: BLOOD  Result Value Ref Range Status   Specimen Description BLOOD LEFT ANTECUBITAL  Final   Special Requests   Final    BOTTLES DRAWN AEROBIC AND ANAEROBIC Blood Culture adequate volume   Culture  Setup Time   Final    GRAM POSITIVE COCCI IN CLUSTERS ANAEROBIC BOTTLE ONLY CRITICAL RESULT CALLED TO, READ BACK BY AND VERIFIED WITH: PHARMD EMILY SINCLAIR AT 0881 ON 05/25/21 BY KJ    Culture (A)  Final    STAPHYLOCOCCUS EPIDERMIDIS THE SIGNIFICANCE OF ISOLATING THIS ORGANISM FROM A SINGLE SET OF  BLOOD CULTURES WHEN MULTIPLE SETS ARE DRAWN IS UNCERTAIN. PLEASE NOTIFY THE MICROBIOLOGY DEPARTMENT WITHIN ONE WEEK IF SPECIATION AND SENSITIVITIES ARE REQUIRED. Performed at [facility], [place] [address], [place], [place] [address]    Report Status 05/27/2021 FINAL  Final  Blood Culture ID Panel (Reflexed)     Status: Abnormal   Collection Time: 05/24/21  8:49 AM  Result Value Ref Range Status   Enterococcus faecalis NOT DETECTED NOT DETECTED Final   Enterococcus Faecium NOT DETECTED NOT DETECTED Final   Listeria monocytogenes NOT DETECTED NOT DETECTED Final   Staphylococcus species DETECTED (A) NOT DETECTED Final    Comment: CRITICAL RESULT CALLED TO, READ BACK BY AND VERIFIED WITH: PHARMD EMILY SINCLAIR AT 1256 BY KJ ON 05/25/21    Staphylococcus aureus (BCID) NOT DETECTED NOT DETECTED Final   Staphylococcus epidermidis DETECTED (  A) NOT DETECTED Final    Comment: CRITICAL RESULT CALLED TO, READ BACK BY AND VERIFIED WITH: PHARMD EMILY SINCLAIR AT 1256 BY KJ ON 05/25/21 Methicillin (oxacillin) resistant coagulase negative staphylococcus. Possible blood culture contaminant (unless isolated from more than one blood culture draw or clinical case suggests pathogenicity). No antibiotic treatment is indicated for blood  culture contaminants.    Staphylococcus lugdunensis NOT DETECTED NOT DETECTED Final   Streptococcus species NOT DETECTED NOT DETECTED Final   Streptococcus agalactiae NOT DETECTED NOT DETECTED Final    Comment: CRITICAL RESULT CALLED TO, READ BACK BY AND VERIFIED WITH: PHARMD EMILYH SINCLAIR AT 7322 ON 05/25/21 BY KJ    Streptococcus pneumoniae NOT DETECTED NOT DETECTED Final   Streptococcus pyogenes NOT DETECTED NOT DETECTED Final   A.calcoaceticus-baumannii NOT DETECTED NOT DETECTED Final   Bacteroides fragilis NOT DETECTED NOT DETECTED Final   Enterobacterales NOT DETECTED NOT DETECTED Final   Enterobacter cloacae complex NOT DETECTED NOT DETECTED Final   Escherichia  coli NOT DETECTED NOT DETECTED Final   Klebsiella aerogenes NOT DETECTED NOT DETECTED Final   Klebsiella oxytoca NOT DETECTED NOT DETECTED Final   Klebsiella pneumoniae NOT DETECTED NOT DETECTED Final   Proteus species NOT DETECTED NOT DETECTED Final   Salmonella species NOT DETECTED NOT DETECTED Final   Serratia marcescens NOT DETECTED NOT DETECTED Final   Haemophilus influenzae NOT DETECTED NOT DETECTED Final   Neisseria meningitidis NOT DETECTED NOT DETECTED Final   Pseudomonas aeruginosa NOT DETECTED NOT DETECTED Final   Stenotrophomonas maltophilia NOT DETECTED NOT DETECTED Final   Candida albicans NOT DETECTED NOT DETECTED Final   Candida auris NOT DETECTED NOT DETECTED Final   Candida glabrata NOT DETECTED NOT DETECTED Final   Candida krusei NOT DETECTED NOT DETECTED Final   Candida parapsilosis NOT DETECTED NOT DETECTED Final   Candida tropicalis NOT DETECTED NOT DETECTED Final   Cryptococcus neoformans/gattii NOT DETECTED NOT DETECTED Final   Methicillin resistance mecA/C DETECTED (A) NOT DETECTED Final    Comment: CRITICAL RESULT CALLED TO, READ BACK BY AND VERIFIED WITH: PHARMD EMILY SINCLAIR AT 1256 ON 05/25/21 BY KJ Performed at [facility], [address] [place], [place] [address]   Culture, blood (routine x 2)     Status: None   Collection Time: 05/24/21  8:57 AM   Specimen: BLOOD  Result Value Ref Range Status   Specimen Description BLOOD BLOOD LEFT HAND  Final   Special Requests   Final    BOTTLES DRAWN AEROBIC AND ANAEROBIC Blood Culture adequate volume   Culture   Final    NO GROWTH 5 DAYS Performed at [facility], [place] [address] [place], [place] [address]    Report Status 05/29/2021 FINAL  Final  MRSA Next Gen by PCR, Nasal     Status: None   Collection Time: 05/24/21  9:58 AM  Result Value Ref Range Status   MRSA by PCR Next Gen NOT DETECTED NOT DETECTED Final    Comment: (NOTE) The GeneXpert MRSA Assay (FDA approved for NASAL specimens  only), is one component of a comprehensive MRSA colonization surveillance program. It is not intended to diagnose MRSA infection nor to guide or monitor treatment for MRSA infections. Test performance is not FDA approved in patients less than 68 years old. Performed at [facility], [place] [address], [place], [place] [address]   Culture, Respiratory w Gram Stain     Status: None   Collection Time: 05/24/21 11:00 AM  Specimen: Tracheal Aspirate; Respiratory  Result Value Ref Range Status   Specimen Description TRACHEAL ASPIRATE  Final   Special Requests NONE  Final   Gram Stain   Final    RARE WBC PRESENT, PREDOMINANTLY MONONUCLEAR NO ORGANISMS SEEN    Culture   Final    FEW Normal respiratory flora-no Staph aureus or Pseudomonas seen Performed at [facility], [address], [place], [place] [address]    Report Status 05/26/2021 FINAL  Final    Coagulation Studies: No results for input(s): LABPROT, INR in the last 72 hours.  Urinalysis: No results for input(s): COLORURINE, LABSPEC, PHURINE, GLUCOSEU, HGBUR, BILIRUBINUR, KETONESUR, PROTEINUR, UROBILINOGEN, NITRITE, LEUKOCYTESUR in the last 72 hours.  Invalid input(s): APPERANCEUR    Imaging: DG Abd 1 View  Result Date: 05/30/2021 CLINICAL DATA:  Nasogastric tube placement. EXAM: ABDOMEN - 1 VIEW COMPARISON:  None. FINDINGS: Enteric tube with tip overlying the expected region of the gastric lumen and side port in the region of the gastroesophageal junction. The bowel gas pattern is normal. No radio-opaque calculi or other significant radiographic abnormality are seen. Aorto bi-iliac stent graft noted. Partially visualized cardiac device leads. IMPRESSION: Enteric tube with tip overlying the expected region of the gastric lumen and side port in the region of the gastroesophageal junction. If side-port desired within the gastric lumen, consider advancing by 3 cm. Electronically Signed   By: Iven Finn M.D.   On:  05/30/2021 22:51   DG CHEST PORT 1 VIEW  Result Date: 05/30/2021 CLINICAL DATA:  Feeding tube placement EXAM: PORTABLE CHEST 1 VIEW COMPARISON:  05/28/2021, 05/11/2021, CT chest 12/22/2020 FINDINGS: Endotracheal tube is not identified. Post sternotomy changes left-sided pacing device. Right IJ catheter sheath over the SVC origin. Esophageal tube tip overlies the proximal stomach.stable architectural distortion and spiculated opacities in the upper lobes. Patchy airspace disease at the left base. Cardiomegaly. Right upper extremity central venous catheter tip over the SVC. IMPRESSION: 1. Removal of endotracheal tube. Esophageal tube tip below the diaphragm, likely over proximal gastric region 2. Little change in patchy left lung base airspace disease. Similar architectural distortion and spiculated opacities in the upper lobes corresponding to findings on chest CT from January. 3. Cardiomegaly Electronically Signed   By: Donavan Foil M.D.   On: 05/30/2021 22:47     Medications:     prismasol BGK 4/2.5 500 mL/hr at 05/31/21 0424    prismasol BGK 4/2.5 200 mL/hr at 05/30/21 2300   sodium chloride Stopped (05/31/21 0857)   sodium chloride     dextrose     feeding supplement (VITAL 1.5 CAL) 1,000 mL (05/29/21 1541)   heparin 2,050 Units/hr (05/31/21 1100)   norepinephrine (LEVOPHED) Adult infusion Stopped (05/30/21 1550)   prismasol BGK 4/2.5 2,000 mL/hr at 05/31/21 1040    amiodarone  200 mg Per Tube Daily   arformoterol  15 mcg Nebulization BID   aspirin  81 mg Per Tube Daily   [START ON 06/01/2021] atorvastatin  10 mg Per Tube Daily   budesonide (PULMICORT) nebulizer solution  0.5 mg Nebulization BID   chlorhexidine gluconate (MEDLINE KIT)  15 mL Mouth Rinse BID   Chlorhexidine Gluconate Cloth  6 each Topical Q0600   feeding supplement (PROSource TF)  90 mL Per Tube TID   glycopyrrolate  0.2 mg Intravenous QID   insulin aspart  1-3 Units Subcutaneous Q4H   insulin aspart  7 Units  Subcutaneous Q4H   insulin detemir  22 Units Subcutaneous Q12H  levothyroxine  50 mcg Per Tube Q0600   midodrine  20 mg Per Tube TID   pantoprazole sodium  40 mg Per Tube BID   revefenacin  175 mcg Nebulization Daily   sodium chloride flush  10-40 mL Intracatheter Q12H   [CANCELED] Place/Maintain arterial line **AND** sodium chloride, acetaminophen (TYLENOL) oral liquid 160 mg/5 mL, dextrose, docusate, fentaNYL (SUBLIMAZE) injection, heparin, LORazepam, metoprolol tartrate, mineral oil-hydrophilic petrolatum, polyethylene glycol, senna, sodium chloride, sodium chloride flush  Assessment/ Plan:  Acute kidney injury Baseline serum creatinine 1.5 mg/dL.  Was initiated on CRRT therapy 05/25/2021.  Continues to be oliguric.  Negative fluid balance about 75 cc an hour per chart.  Continues on a 4K bath, no heparin( systemic gtt.). Acute on chronic systolic heart failure massive volume overload Shock Levophed, midodrine, vancomycin, meropenem Atrial fibrillation/flutter amiodarone, heparin Diabetes mellitus on insulin sliding scale Hypokalemia resolved Hypoxic respiratory failure management per critical care service   LOS: [place] _0 _1 :18 AM

## 2021-05-31 NOTE — Progress Notes (Addendum)
Filter clotted.Will page Renal on cal.  2114- Spoke with Dr.Lin. Per MD may d/c CRRT and re evaluate tom for regular HD.  2121- Updated Judeen Hammans daughter on the phone.

## 2021-05-31 NOTE — Telephone Encounter (Signed)
Prepared form and emailed to Dr. Carlis Abbott for signature. -pr

## 2021-05-31 NOTE — Progress Notes (Signed)
Nutrition Follow-up  DOCUMENTATION CODES:   Obesity unspecified  INTERVENTION:   Plan for Cortrak tomorrow  Tube Feeding via NG:  Vital 1.5 at 55 ml/hr Pro-Source TF 90 mL TID Provides 2200 kcals, 155 g of protein and 1003 mL of free water  Add B complex with C  Add Juven BID, each packet provides 80 calories, 8 grams of carbohydrate, 2.5  grams of protein (collagen), 7 grams of L-arginine and 7 grams of L-glutamine; supplement contains CaHMB, Vitamins C, E, B12 and Zinc to promote wound healing    NUTRITION DIAGNOSIS:   Inadequate oral intake related to acute illness as evidenced by NPO status.  Being addressed via TF   GOAL:   Patient will meet greater than or equal to 90% of their needs  Met via TF  MONITOR:   Vent status, TF tolerance, Labs, Weight trends  REASON FOR ASSESSMENT:   Consult, Ventilator Enteral/tube feeding initiation and management  ASSESSMENT:   75 yo male admitted with acute respiratory failure, CHF, hx of lung cancer with 3 recurrences, AKI on CKD. PMH includes CHF, DM, CAD/CABG, HLD, COPD, GERD, PVD, GERD, hiatal hernia  5/30 Admitted, Intubated 6/13 TEE,-EF 20-25%, Cardioversion 6/15 CRRT initiated 6/19 Family meeting-no trach or long term iHD 6/20 Extubated,-one way, NG placed  Awake, not following commands. Remains on CRRT  NG placed overnight; abd xray to confirm placement.  Plan for exchanging NG out for Cortrak tube tomorrow  Pt has been tolerating Vital 1.5 at 55 ml/hr with Pro-Source TF 90 mL TID  Pt received enema for constipation x 1 week ago, now with liquid stool with rectal tube  Weight has fluctuated; wt of 128 kg on 6/18-no wt since. Lowest wt this admission 119 kg  Labs: reviewed Meds: ss novolog, novolog q 4 hours, levemir     Diet Order:   Diet Order     None       EDUCATION NEEDS:   Not appropriate for education at this time  Skin:  Skin Assessment: Skin Integrity Issues: Skin Integrity  Issues:: DTI DTI: buttocks  Last BM:  6/14 rectal tube  Height:   Ht Readings from Last 1 Encounters:  05/17/21 5' 7"  (1.702 m)    Weight:   Wt Readings from Last 1 Encounters:  05/28/21 128.3 kg    BMI:  Body mass index is 44.3 kg/m.  Estimated Nutritional Needs:   Kcal:  2020-2345 kcals  Protein:  135-170 g  Fluid:  >/= 1.8 L   Kerman Passey MS, RDN, LDN, CNSC Registered Dietitian III Clinical Nutrition RD Pager and On-Call Pager Number Located in [place]

## 2021-05-31 NOTE — Progress Notes (Signed)
Patient ID: [name], male   DOB: 01/03/1946, 75 y.o.   MRN: [id number]    Progress Note from the Palliative Medicine Team at [facility]   Patient Name: [name]        Date: 05/31/2021 DOB: 1946/01/26  Age: 75 y.o. MRN#: [id number] Attending Physician: [name], MD Primary Care Physician: [name], MD Admit Date: 05/10/2021   Medical records reviewed   75 y.o. male  with past medical history of morbid obesity, heart failure with reduced ejection fraction (baseline 40%), CPOD, ventricular tachycardia, coronary artery disease, left bundle branch with, AAA, non-small cell lung cancer status post XRT, BPH, diabetes type 2, peripheral vascular disease  admitted on 04/18/2021 with Respiratory failure, CHF exacerbation, COPD exacerbation.   During this admission the patient developed shock, suspected combination septic shock and cardiogenic shock, sepsis, acute on chronic systolic heart failure, A. fib with RVR now status post DCCV, acute kidney injury on CKD requiring CRRT, pulmonary edema, fluid overload, thrombocytopenia (now resolved).  This NP visited patient at the bedside as a follow up for palliative medicine needs and emotional support.  Decision was made yesterday for one-way extubation.  Patient is "holding his own".  He remains encephalopathic, unable to follow commands with audible throat secretions which she is unable to clear on his own.    I met at the bedside with the patient's family to include his wife,  son and daughter in law.    All understand the seriousness of the patient's current medical situation, however they remain hopeful, they are following guidance and recommendations from  CCM.   Emotional support offered.  Plan of care: -DNR/DNI -Continue current medical interventions, treat the treatable.  Family remain hopeful for improvement -If patient decompensates, comfort and dignity are ultimately the focus of care, allowing for natural  death  Discussed with family the importance of continued conversation with each other and the  medical providers regarding overall plan of care and treatment options,  ensuring decisions are within the context of the patients values and GOCs.  I worry [name] is high risk for decompensation.   Again education offered to family regarding the difference between an aggressive medical intervention path and a palliative comfort path for this patient at this time in this situation.  Decision regarding aggressiveness of interventions is ultimately family decision.  PMT will continue to support holistically  This nurse practitioner informed  the patient that I will be out of the hospital until Monday morning.  If the patient is still hospitalized I will follow-up at that time.  My colleague  Osborne Oman NP will follow up and be available for questions or concern thru Friday.    Call palliative medicine team phone # [phone] with questions or concerns.    Questions and concerns addressed    Total time spent on the unit was 60 minutes   Greater than 50% of the time was spent in counseling and coordination of care  Wadie Lessen NP  Palliative Medicine Team Team Phone # [phone] Pager [phone]

## 2021-05-31 NOTE — Progress Notes (Signed)
NAME:  DAREY HERSHBERGER, MRN:  [id number], DOB:  04/10/1946, LOS: 30 ADMISSION DATE:  04/24/2021, CONSULTATION DATE:  05/09/2021 REFERRING MD:  Dr. Randal Buba, CHIEF COMPLAINT:  SOB  History of Present Illness:  Mr. Goodner is a 75 yo M with past medical hx as below presenting from home with complaints of SOB/ respiratory distress on 5/29.  Treated by EMS with duonebs, epi, solumedrol, and mag without improvement, requiring BVM.  On arrival to ER, patient required intubation as he was peri- arrest, unresponsive, bradycardic, temp 95.9, hypotensive, and apneic with diffuse apical wheeze requiring emergent intubation on arrival.  Noted to have pink frothy secretions on intubation.  PCCM called for admit.   Significant Hospital Events: Including procedures, antibiotic start and stop dates in addition to other pertinent events   5/30 admitted for respiratory failure/ intubated, empiric CAP coverage/ duresis 6/1 BLLE Duplex> no DVT, unable to complete thoracentesis due to anatomy  6/2 Tmax 101, -2.9L in last 24 hours, on levo at 71mcg, precedex. Resp culture negative. BC pending. 6/3 switched from heparin to argatroban for thrombocytopenia, HIT ab pending 6/4 HIT AB negative, switched back to heparin, failed SBT due to tachypnea 6/7 afib w/ RVR, HR increased abruptly to 140s from 90s, received amio bolus and started on drip, got fentanyl and versed, started cefepime and vanco, d/c norepi, receiving vasopressin, precedex, phenylephrine 6/9 - Stopped cefepime and started meropenem, continued vanco due to concern for cefepime- associated drug rash 6/12 unable to extubate. Afib RVR worse 6/13: on mech vent; Levo at 2; 60 Amio; Afib RVR persist; TEE and cardioverted 150 J into sinus rhythm and HR in 80s 6/14: Levo increased to 20; WBC and fever trending up; cultures sent; started back on meropenem and vanc 6/15: creatine function worsening; nephrology consulted, started on CRRT 6/16: Net UF goal increased.   Tolerated well.  Sputum came back as normal flora stop date placed for antibiotics.  Tolerated just over 2 hours of pressure support ventilation 6/17 continuing to pull fluid but decreased UF goal.  Decreasing RASS goal to 0 and attempt to more aggressively wean.  Decreased clonazepam to 0.5 mg twice a day from 1 mg twice a day, decreased as needed fentanyl dosing. 6/19 Family meeting to establish goals of care in light of planned extubation. Decided on one way extubation given limited improvement on aggressive therapy.  6/20: Extubated   Interim History / Subjective:   No respiratory distress.  Patient is awake.  Objective   Blood pressure (!) 125/45, pulse 74, temperature 97.8 F (36.6 C), temperature source Axillary, resp. rate (!) 21, height 5\' 7"  (1.702 m), weight 128.3 kg, SpO2 93 %. CVP:  [0 mmHg-20 mmHg] 15 mmHg      Intake/Output Summary (Last 24 hours) at 05/31/2021 1400 Last data filed at 05/31/2021 1300 Gross per 24 hour  Intake 1423.43 ml  Output 2375 ml  Net -951.57 ml     Filed Weights   05/26/21 0431 05/27/21 0126 05/28/21 0215  Weight: 134.4 kg 131.7 kg 128.3 kg   Examination:  Physical exam: General: Chronically ill-appearing man morbid obesity HEENT: [place]/AT, eyes anicteric.  NG tube in place Neuro: Eyes open, tracks to voice and follows commands, lifts arms Chest: Coarse breath sounds, no wheezes or rhonchi Heart: Regular rate and rhythm, no murmurs or gallops.  Essentially no residual edema.  Abdomen: Soft, nontender, nondistended, bowel sounds present Skin: No rash  Labs/imaging that I havepersonally reviewed  (right click and "Reselect all SmartList Selections"  daily)   Electrolytes normal, mild anemia  Resolved Hospital Problem list    Thrombocytopenia 4T score= 4, intermediate risk for HIT, although B- lactams can also decrease platelets. HIT Ab negative Hypernatremia Constipation  Assessment & Plan:   Acute hypoxic/ hypercarbic respiratory  failure 2/2 diffuse bilateral infiltrates (mix of PNA, acute pulmonary edema) Further c/b Hx of COPD Mixed septic/cardiogenic shock 2/2 PNA Acute on chronic biventricular HFrEF (12/24/20 EF 40-45%, G2DD, mildly reduced systolic RV, mild MR) >> LVEF down to 20-25% this admission Chronic atrial fibrillation with RVR s/p cardioversion Hx of VT on amio w/ICD, CAD s/p CABG 1996, LBBB, HTN Acute metabolic encephalopathy superimposed on baseline anxiety disorder AKI on CKD 3b, hx BPH, phimosis on PTA, finasteride  Obstructive uropathy  Hypothyroidism, stable Morbid obesity  Plan:  -Has tolerated extubation surprisingly well. -We will stop remaining scheduled sedatives and allow mentation to clear further.  This may still take several days as patient had been receiving sedation for quite some time. -If remains extubated and comfortable tomorrow.  We will challenge his kidneys with diuretic and attempt to come off CRRT. -Emphasized again to the wife that the goal here is to provide quality of life and that if he appears to be suffering we will transition to comfort care. -Small bore feeding tube tomorrow.   Best Practice (right click and "Reselect all SmartList Selections" daily)   Diet/type: tubefeeds Pain/Anxiety/Delirium protocol Yes and RASS goal: 0 VAP protocol (if indicated): Yes DVT prophylaxis: systemic heparin GI prophylaxis: PPI Glucose control:  insulin gtt.  Central venous access:  Yes, and it is still needed Arterial line: [name] Foley:  Yes, and it is still needed Mobility:  bed rest  PT consulted: [name] Antibiotics:meropenem Antibiotic de-escalation: no,  continue current rx Code Status:  full code Last date of multidisciplinary goals of care discussion [6/19], one way extubation today.  Disposition: remains critically ill, will stay in intensive care  CRITICAL CARE Performed by: [name]   Total critical care time: 35 minutes  Critical care time was exclusive of  separately billable procedures and treating other patients.  Critical care was necessary to treat or prevent imminent or life-threatening deterioration.  Critical care was time spent personally by me on the following activities: development of treatment plan with patient and/or surrogate as well as nursing, discussions with consultants, evaluation of patient's response to treatment, examination of patient, obtaining history from patient or surrogate, ordering and performing treatments and interventions, ordering and review of laboratory studies, ordering and review of radiographic studies, pulse oximetry, re-evaluation of patient's condition and participation in multidisciplinary rounds.  [name], MD [facility] ICU Physician [place]  Pager: [phone] Mobile: [phone] After hours: [phone].

## 2021-05-31 NOTE — Progress Notes (Signed)
ANTICOAGULATION CONSULT NOTE  Pharmacy Consult for  heparin  Indication: afib  Labs: Recent Labs    05/29/21 0252 05/29/21 0253 05/29/21 1211 05/29/21 1615 05/30/21 0212 05/30/21 1617 05/31/21 0415  HGB 9.2*  --   --   --  9.6*  --  9.4*  HCT 29.5*  --   --   --  31.2*  --  31.3*  PLT 188  --   --   --  202  --  194  HEPARINUNFRC  --    < > 0.42  --  0.42  --  0.42  CREATININE  --    < > 0.84   < > 0.71 0.79 0.88   < > = values in this interval not displayed.    Assessment: 75 yo male with h/o Afib, Xarelto on hold, new thrombocytopenia on heparin and concern for HIT so was switched to argatroban. Heparin antibody was negative, so switched back to heparin on 05/14/21.  Heparin level therapeutic at 0.42, CBC stable. Reported BRBPR 6/17 but resolved.   Goal of Therapy:  Heparin level 0.3-0.7 units/ml Monitor platelets by anticoagulation protocol: Yes   Plan:  Continue IV heparin 2050 units/h Daily heparin level and CBC  [place] Crane, Vena Austria, BCPS, [facility] Clinical Pharmacist  05/31/2021 11:28 AM   [facility] pharmacy phone numbers are listed on [email or web address]

## 2021-06-01 DIAGNOSIS — J9601 Acute respiratory failure with hypoxia: Secondary | ICD-10-CM | POA: Diagnosis not present

## 2021-06-01 DIAGNOSIS — J9602 Acute respiratory failure with hypercapnia: Secondary | ICD-10-CM | POA: Diagnosis not present

## 2021-06-01 DIAGNOSIS — N179 Acute kidney failure, unspecified: Secondary | ICD-10-CM | POA: Diagnosis not present

## 2021-06-01 DIAGNOSIS — R092 Respiratory arrest: Secondary | ICD-10-CM | POA: Diagnosis not present

## 2021-06-01 DIAGNOSIS — Z7189 Other specified counseling: Secondary | ICD-10-CM

## 2021-06-01 DIAGNOSIS — R579 Shock, unspecified: Secondary | ICD-10-CM | POA: Diagnosis not present

## 2021-06-01 LAB — RENAL FUNCTION PANEL
Albumin: 2.1 g/dL — ABNORMAL LOW (ref 3.5–5.0)
Anion gap: 9 (ref 5–15)
BUN: 46 mg/dL — ABNORMAL HIGH (ref 8–23)
CO2: 25 mmol/L (ref 22–32)
Calcium: 8.9 mg/dL (ref 8.9–10.3)
Chloride: 100 mmol/L (ref 98–111)
Creatinine, Ser: 1.44 mg/dL — ABNORMAL HIGH (ref 0.61–1.24)
GFR, Estimated: 50 mL/min — ABNORMAL LOW (ref >60)
Glucose, Bld: 173 mg/dL — ABNORMAL HIGH (ref 70–99)
Phosphorus: 3.7 mg/dL (ref 2.5–4.6)
Potassium: 4.6 mmol/L (ref 3.5–5.1)
Sodium: 134 mmol/L — ABNORMAL LOW (ref 135–145)

## 2021-06-01 LAB — CBC
HCT: 29.6 % — ABNORMAL LOW (ref 39.0–52.0)
Hemoglobin: 9.2 g/dL — ABNORMAL LOW (ref 13.0–17.0)
MCH: 31.8 pg (ref 26.0–34.0)
MCHC: 31.1 g/dL (ref 30.0–36.0)
MCV: 102.4 fL — ABNORMAL HIGH (ref 80.0–100.0)
Platelets: 226 10*3/uL (ref 150–400)
RBC: 2.89 MIL/uL — ABNORMAL LOW (ref 4.22–5.81)
RDW: 16.1 % — ABNORMAL HIGH (ref 11.5–15.5)
WBC: 12.1 10*3/uL — ABNORMAL HIGH (ref 4.0–10.5)
nRBC: 0.3 % — ABNORMAL HIGH (ref 0.0–0.2)

## 2021-06-01 LAB — GLUCOSE, CAPILLARY
Glucose-Capillary: 184 mg/dL — ABNORMAL HIGH (ref 70–99)
Glucose-Capillary: 163 mg/dL — ABNORMAL HIGH (ref 70–99)
Glucose-Capillary: 162 mg/dL — ABNORMAL HIGH (ref 70–99)

## 2021-06-01 LAB — HEPARIN LEVEL (UNFRACTIONATED): Heparin Unfractionated: 0.37 IU/mL (ref 0.30–0.70)

## 2021-06-01 LAB — MAGNESIUM: Magnesium: 2.9 mg/dL — ABNORMAL HIGH (ref 1.7–2.4)

## 2021-06-01 MED ORDER — DIPHENHYDRAMINE HCL 50 MG/ML IJ SOLN: 25.0000 mg | INTRAMUSCULAR | Status: DC | PRN

## 2021-06-01 MED ORDER — ACETAMINOPHEN 325 MG PO TABS: 650.0000 mg | ORAL_TABLET | Freq: Four times a day (QID) | ORAL | Status: DC | PRN

## 2021-06-01 MED ORDER — LORAZEPAM 2 MG/ML IJ SOLN
1.0000 mg | Freq: Four times a day (QID) | INTRAMUSCULAR | Status: DC
  Administered 2021-06-01: 1 mg via INTRAVENOUS
  Filled 2021-06-01: qty 1

## 2021-06-01 MED ORDER — POLYVINYL ALCOHOL 1.4 % OP SOLN
1.0000 [drp] | Freq: Four times a day (QID) | OPHTHALMIC | Status: DC | PRN
  Filled 2021-06-01: qty 15

## 2021-06-01 MED ORDER — HYDROMORPHONE BOLUS VIA INFUSION
0.5000 mg | INTRAVENOUS | Status: DC | PRN
  Filled 2021-06-01: qty 1

## 2021-06-01 MED ORDER — SCOPOLAMINE 1 MG/3DAYS TD PT72
1.0000 | MEDICATED_PATCH | TRANSDERMAL | Status: DC
  Administered 2021-06-01: 1.5 mg via TRANSDERMAL
  Filled 2021-06-01: qty 1

## 2021-06-01 MED ORDER — HYDROMORPHONE HCL PF 10 MG/ML IJ SOLN
0.0000 mg/h | INTRAMUSCULAR | Status: DC
  Administered 2021-06-01: 4 mg/h via INTRAVENOUS
  Administered 2021-06-01: 5 mg/h via INTRAVENOUS
  Administered 2021-06-01: 1 mg/h via INTRAVENOUS
  Filled 2021-06-01: qty 5
  Filled 2021-06-01 (×4): qty 2.5

## 2021-06-01 MED ORDER — HYDROMORPHONE BOLUS VIA INFUSION
2.0000 mg | INTRAVENOUS | Status: DC | PRN
  Filled 2021-06-01: qty 2

## 2021-06-01 MED ORDER — ACETAMINOPHEN 650 MG RE SUPP: 650.0000 mg | Freq: Four times a day (QID) | RECTAL | Status: DC | PRN

## 2021-06-01 MED ORDER — GLYCOPYRROLATE 0.2 MG/ML IJ SOLN
0.2000 mg | INTRAMUSCULAR | Status: DC | PRN
  Administered 2021-06-01: 0.2 mg via INTRAVENOUS
  Filled 2021-06-01 (×2): qty 1

## 2021-06-01 MED ORDER — MORPHINE SULFATE (PF) 2 MG/ML IV SOLN
1.0000 mg | INTRAVENOUS | Status: DC | PRN
  Administered 2021-06-01: 2 mg via INTRAVENOUS
  Filled 2021-06-01: qty 1

## 2021-06-01 MED ORDER — LORAZEPAM 2 MG/ML IJ SOLN
2.0000 mg | INTRAMUSCULAR | Status: DC | PRN
  Administered 2021-06-01: 2 mg via INTRAVENOUS
  Filled 2021-06-01: qty 1

## 2021-06-01 MED ORDER — GLYCOPYRROLATE 1 MG PO TABS
1.0000 mg | ORAL_TABLET | ORAL | Status: DC | PRN
  Filled 2021-06-01: qty 1

## 2021-06-01 MED ORDER — GLYCOPYRROLATE 0.2 MG/ML IJ SOLN: 0.2000 mg | INTRAMUSCULAR | Status: DC | PRN

## 2021-06-01 MED ORDER — FUROSEMIDE 10 MG/ML IJ SOLN
160.0000 mg | Freq: Once | INTRAVENOUS | Status: AC
  Administered 2021-06-01: 160 mg via INTRAVENOUS
  Filled 2021-06-01: qty 16

## 2021-06-01 NOTE — Progress Notes (Signed)
Patient ID: [name], male   DOB: Nov 23, 1946, 75 y.o.   MRN: [id number]    Progress Note from the Palliative Medicine Team at [facility]   Patient Name: [name]        Date: 06/01/2021 DOB: [birth year]  Age: 75 y.o. MRN#: [id number] Attending Physician: [name], MD Primary Care Physician: [name], MD Admit Date: 04/21/2021   Medical records reviewed   75 y.o. male  with past medical history of morbid obesity, heart failure with reduced ejection fraction (baseline 40%), CPOD, ventricular tachycardia, coronary artery disease, left bundle branch with, AAA, non-small cell lung cancer status post XRT, BPH, diabetes type 2, peripheral vascular disease  admitted on 04/14/2021 with Respiratory failure, CHF exacerbation, COPD exacerbation.   During this admission the patient developed shock, suspected combination septic shock and cardiogenic shock, sepsis, acute on chronic systolic heart failure, A. fib with RVR now status post DCCV, acute kidney injury on CKD requiring CRRT, pulmonary edema, fluid overload, thrombocytopenia (now resolved).  [name] was one-way extubated on 05/30/2021. Remains encephalopathic, secretions worsened overnight requiring staff to suction. His eyes are open but will not track or follow commands.   Attending had long discussion with family this morning and care was transition to focus on comfort.   I met with patient's family at the bedside (wife and daughter). Emotional support provided. Wife holding patient's hand and expressing her appreciation of patient holding on thus far.   We discussed focus of comfort. Education provided on symptom management.  RN is preparing to receive dilaudid infusion to assist in symptom management.   All questions answered and support provided.   Assessment: -critically ill -awake but will not follow commands or track with eyes -somewhat labored breathing, with audible congestion  Plan of  care: -DNR/DNI -All care transitioned to focus on comfort per attending.  -Manage all symptoms aggressively with PRNs for comfort.  -Anticipated hospital death -PMT will continue to support and follow as needed.   Time Total: 35 min.  Visit consisted of counseling and education dealing with the complex and emotionally intense issues of symptom management and palliative care in the setting of serious and potentially life-threatening illness.Greater than 50%  of this time was spent counseling and coordinating care related to the above assessment and plan.  Alda Lea, AGPCNP-BC  Palliative Medicine Team [phone]

## 2021-06-01 NOTE — Progress Notes (Addendum)
2147 Pt arrived on the unit. Pt's family at the bedside, all questions have been answered. Blankets given to pt family.  0120 Pt has expired. Family was by pt's side. Earlie Server, MD notified. Bed placement notified.[facility] notified. Pt's IV's, PICC lines taken out. Pt prepared and transported to morgue.Hydromorphone (Dilaudid) IV, 80 ml wasted with Joycelyn Schmid.

## 2021-06-01 NOTE — Progress Notes (Signed)
Report called to 5N nurse at this time.  2120- Pt transferred via [place]- Bed. Personal belongings given to family. Family x3 at bedside.  2140- Patients family oriented to room and introduced to 5N nurse.

## 2021-06-01 NOTE — Progress Notes (Signed)
NAME:  [name], MRN:  [id number], DOB:  1946/09/23, LOS: 23 ADMISSION DATE:  05/07/2021, CONSULTATION DATE:  05/09/2021 REFERRING MD:  Dr. Randal Buba, CHIEF COMPLAINT:  SOB  History of Present Illness:  [name] is a 75 yo M with past medical hx as below presenting from home with complaints of SOB/ respiratory distress on 5/29.  Treated by EMS with duonebs, epi, solumedrol, and mag without improvement, requiring BVM.  On arrival to ER, patient required intubation as he was peri- arrest, unresponsive, bradycardic, temp 95.9, hypotensive, and apneic with diffuse apical wheeze requiring emergent intubation on arrival.  Noted to have pink frothy secretions on intubation.  PCCM called for admit.   Significant Hospital Events: Including procedures, antibiotic start and stop dates in addition to other pertinent events   5/30 admitted for respiratory failure/ intubated, empiric CAP coverage/ duresis 6/1 BLLE Duplex> no DVT, unable to complete thoracentesis due to anatomy  6/2 Tmax 101, -2.9L in last 24 hours, on levo at 43mcg, precedex. Resp culture negative. BC pending. 6/3 switched from heparin to argatroban for thrombocytopenia, HIT ab pending 6/4 HIT AB negative, switched back to heparin, failed SBT due to tachypnea 6/7 afib w/ RVR, HR increased abruptly to 140s from 90s, received amio bolus and started on drip, got fentanyl and versed, started cefepime and vanco, d/c norepi, receiving vasopressin, precedex, phenylephrine 6/9 - Stopped cefepime and started meropenem, continued vanco due to concern for cefepime- associated drug rash 6/12 unable to extubate. Afib RVR worse 6/13: on mech vent; Levo at 2; 60 Amio; Afib RVR persist; TEE and cardioverted 150 J into sinus rhythm and HR in 80s 6/14: Levo increased to 20; WBC and fever trending up; cultures sent; started back on meropenem and vanc 6/15: creatine function worsening; nephrology consulted, started on CRRT 6/16: Net UF goal increased.   Tolerated well.  Sputum came back as normal flora stop date placed for antibiotics.  Tolerated just over 2 hours of pressure support ventilation 6/17 continuing to pull fluid but decreased UF goal.  Decreasing RASS goal to 0 and attempt to more aggressively wean.  Decreased clonazepam to 0.5 mg twice a day from 1 mg twice a day, decreased as needed fentanyl dosing. 6/19 Family meeting to establish goals of care in light of planned extubation. Decided on one way extubation given limited improvement on aggressive therapy.  6/20: Extubated 6.22: increasing distress. Transitioned to full comfort care   Interim History / Subjective:   Less responsive, increasing respiratory distress with difficulty clearing upper airway secretions.   Objective   Blood pressure (!) 123/49, pulse 80, temperature (!) 97.5 F (36.4 C), temperature source Oral, resp. rate (!) 24, height 5\' 7"  (1.702 m), weight 128.3 kg, SpO2 93 %. CVP:  [12 mmHg-18 mmHg] 12 mmHg  FiO2 (%):  [28 %] 28 %   Intake/Output Summary (Last 24 hours) at 06/01/2021 1051 Last data filed at 06/01/2021 0600 Gross per 24 hour  Intake 1585.08 ml  Output 1198 ml  Net 387.08 ml     Filed Weights   05/27/21 0126 05/28/21 0215  Weight: 131.7 kg 128.3 kg   Examination:  Physical exam: General: Chronically ill-appearing man morbid obesity HEENT: [place]/AT, eyes anicteric.  NG tube in place Neuro: Eyes open, no longer tracks to voice.  Chest: ++ upper airway rhonchi Heart: Regular rate and rhythm, no murmurs or gallops.  Essentially no residual edema.  Abdomen: Soft, nontender, nondistended, bowel sounds present Skin: No rash  Labs/imaging that I  havepersonally reviewed  (right click and "Reselect all SmartList Selections" daily)   Electrolytes normal, mild anemia  Resolved Hospital Problem list    Thrombocytopenia 4T score= 4, intermediate risk for HIT, although B- lactams can also decrease platelets. HIT Ab  negative Hypernatremia Constipation  Assessment & Plan:   Acute hypoxic/ hypercarbic respiratory failure 2/2 diffuse bilateral infiltrates (mix of PNA, acute pulmonary edema) Further c/b Hx of COPD Mixed septic/cardiogenic shock 2/2 PNA Acute on chronic biventricular HFrEF (12/24/20 EF 40-45%, G2DD, mildly reduced systolic RV, mild MR) >> LVEF down to 20-25% this admission Chronic atrial fibrillation with RVR s/p cardioversion Hx of VT on amio w/ICD, CAD s/p CABG 1996, LBBB, HTN Acute metabolic encephalopathy superimposed on baseline anxiety disorder AKI on CKD 3b, hx BPH, phimosis on PTA, finasteride  Obstructive uropathy  Hypothyroidism, stable Morbid obesity  Plan:  -More respiratory distress today. Appears uncomfortable. Will transition to comfort care today following discussions with the family.    Best Practice (right click and "Reselect all SmartList Selections" daily)   Comfort care order set in place.  Disposition: in-patient comfort care status    Tyrone Brood, MD [facility] ICU Physician [place]  Pager: [phone] Mobile: [phone] After hours: [phone].

## 2021-06-01 NOTE — Progress Notes (Signed)
eLink Physician-Brief Progress Note Patient Name: [name] DOB: Dec 31, 1945 MRN: [id number]   Date of Service  06/01/2021  HPI/Events of Note  Nursing request for Nasotracheal suctioning PRN.   eICU Interventions  Will order Nasotracheal suctioning PRN.      Intervention Category Major Interventions: Other:  Lysle Dingwall 06/01/2021, 12:54 AM

## 2021-06-01 NOTE — Progress Notes (Signed)
[facility] ROUNDING NOTE   Subjective:   Interval History: This is a 75 year old pleasant gentleman who was admitted with respiratory distress 05/06/2021 in the emergency room he required intubation he was unresponsive hypotensive.  He started on pressors and broad-spectrum antibiotics.  His baseline serum creatinine about 1.5 mg/dL and was started on CRRT 05/25/2021.  Blood pressure 137/56 pulse 86 temperature 97.5 O2 sats 93% 2 L nasal cannula  Sodium 134 potassium 4.6 chloride 100 CO2 25 BUN 46 creatinine 1.44 glucose 173 calcium 8.9 phosphorus 3.7 magnesium 2.9 hemoglobin 9.2  Discussed with critical care.  Patient is transitioning to hospice  Objective:  Vital signs in last 24 hours:  Temp:  [97.5 F (36.4 C)-100.8 F (38.2 C)] 97.5 F (36.4 C) (06/22 0802) Pulse Rate:  [74-86] 80 (06/22 0812) Resp:  [18-31] 24 (06/22 0812) BP: (95-152)/(39-59) 123/49 (06/22 0600) SpO2:  [90 %-94 %] 93 % (06/22 0600) FiO2 (%):  [28 %] 28 % (06/22 0812)  Weight change:  Filed Weights   05/27/21 0126 05/28/21 0215  Weight: 131.7 kg 128.3 kg    Intake/Output: I/O last 3 completed shifts: In: 2657.3 [I.V.:1092.3; NG/GT:1565] Out: 2736 [Urine:435; WAQLR:3736; Stool:50]   Intake/Output this shift:  No intake/output data recorded.  GEN: NAD,lying in bed awake HEENT sclera anicteric NECK + JVD PULM coarse rhonchi throughout anteriorly, L > R CV RRR ABD soft, mildly distended with abd wall edema EXT 2+ anasarca with areas of weeping, slightly improved NEURO intubated and sedated ACCESS: R IJ nontunneled HD cath   Basic Metabolic Panel: Recent Labs  Lab 05/28/21 0415 05/28/21 1655 05/29/21 0252 05/29/21 0253 05/30/21 0212 05/30/21 1617 05/31/21 0415 05/31/21 1829 06/01/21 0505  NA 137   < >  --    < > 136 135 137 135 134*  K 4.3   < >  --    < > 4.6 5.2* 5.1 4.6 4.6  CL 104   < >  --    < > 103 101 100 103 100  CO2 25   < >  --    < > 27 26 24 27 25   GLUCOSE  187*   < >  --    < > 221* 178* 170* 113* 173*  BUN 35*   < >  --    < > 25* 22 22 31* 46*  CREATININE 0.83   < >  --    < > 0.71 0.79 0.88 0.93 1.44*  CALCIUM 7.5*   < >  --    < > 8.1* 8.5* 8.6* 8.6* 8.9  MG 2.4  --  2.3  --  2.6*  --  2.6*  --  2.9*  PHOS 3.0   < >  --    < > 2.9 3.2 3.2 2.8 3.7   < > = values in this interval not displayed.     Liver Function Tests: Recent Labs  Lab 05/30/21 0212 05/30/21 1617 05/31/21 0415 05/31/21 1829 06/01/21 0505  ALBUMIN 1.9* 2.2* 2.2* 2.2* 2.1*    No results for input(s): LIPASE, AMYLASE in the last 168 hours. No results for input(s): AMMONIA in the last 168 hours.  CBC: Recent Labs  Lab 05/28/21 0415 05/29/21 0252 05/30/21 0212 05/31/21 0415 06/01/21 0505  WBC 14.9* 13.4* 11.8* 11.2* 12.1*  NEUTROABS  --  11.7*  --   --   --   HGB 9.3* 9.2* 9.6* 9.4* 9.2*  HCT 30.3* 29.5* 31.2* 31.3* 29.6*  MCV 101.0* 102.1*  102.3* 103.0* 102.4*  PLT 164 188 202 194 226     Cardiac Enzymes: No results for input(s): CKTOTAL, CKMB, CKMBINDEX, TROPONINI in the last 168 hours.  BNP: Invalid input(s): POCBNP  CBG: Recent Labs  Lab 05/31/21 1612 05/31/21 1941 05/31/21 2344 06/01/21 0336 06/01/21 0804  GLUCAP 130* 112* 148* 184* 163*     Microbiology: Results for orders placed or performed during the hospital encounter of 04/16/2021  Resp Panel by RT-PCR (Flu A&B, Covid) Nasopharyngeal Swab     Status: None   Collection Time: 05/05/2021 11:18 PM   Specimen: Nasopharyngeal Swab; Nasopharyngeal(NP) swabs in vial transport medium  Result Value Ref Range Status   SARS Coronavirus 2 by RT PCR NEGATIVE NEGATIVE Final    Comment: (NOTE) SARS-CoV-2 target nucleic acids are NOT DETECTED.  The SARS-CoV-2 RNA is generally detectable in upper respiratory specimens during the acute phase of infection. The lowest concentration of SARS-CoV-2 viral copies this assay can detect is 138 copies/mL. A negative result does not preclude  SARS-Cov-2 infection and should not be used as the sole basis for treatment or other patient management decisions. A negative result may occur with  improper specimen collection/handling, submission of specimen other than nasopharyngeal swab, presence of viral mutation(s) within the areas targeted by this assay, and inadequate number of viral copies(<138 copies/mL). A negative result must be combined with clinical observations, patient history, and epidemiological information. The expected result is Negative.  Fact Sheet for Patients:  EntrepreneurPulse.com.au  Fact Sheet for Healthcare Providers:  IncredibleEmployment.be  This test is no t yet approved or cleared by the [place] FDA and  has been authorized for detection and/or diagnosis of SARS-CoV-2 by FDA under an Emergency Use Authorization (EUA). This EUA will remain  in effect (meaning this test can be used) for the duration of the COVID-19 declaration under Section 564(b)(1) of the Act, 21 U.S.C.section 360bbb-3(b)(1), unless the authorization is terminated  or revoked sooner.       Influenza A by PCR NEGATIVE NEGATIVE Final   Influenza B by PCR NEGATIVE NEGATIVE Final    Comment: (NOTE) The Xpert Xpress SARS-CoV-2/FLU/RSV plus assay is intended as an aid in the diagnosis of influenza from Nasopharyngeal swab specimens and should not be used as a sole basis for treatment. Nasal washings and aspirates are unacceptable for Xpert Xpress SARS-CoV-2/FLU/RSV testing.  Fact Sheet for Patients: EntrepreneurPulse.com.au  Fact Sheet for Healthcare Providers: IncredibleEmployment.be  This test is not yet approved or cleared by the [place] FDA and has been authorized for detection and/or diagnosis of SARS-CoV-2 by FDA under an Emergency Use Authorization (EUA). This EUA will remain in effect (meaning this test can be used) for the duration of  the COVID-19 declaration under Section 564(b)(1) of the Act, 21 U.S.C. section 360bbb-3(b)(1), unless the authorization is terminated or revoked.  Performed at [facility], [place] [address], [place], [place] [address]   Culture, blood (routine x 2)     Status: None   Collection Time: 05/09/21  1:16 AM   Specimen: BLOOD LEFT HAND  Result Value Ref Range Status   Specimen Description BLOOD LEFT HAND  Final   Special Requests   Final    BOTTLES DRAWN AEROBIC ONLY Blood Culture results may not be optimal due to an inadequate volume of blood received in culture bottles   Culture   Final    NO GROWTH 5 DAYS Performed at [facility], [place] [address], [place], [place] [address]  Report Status 05/14/2021 FINAL  Final  Culture, blood (routine x 2)     Status: None   Collection Time: 05/09/21  1:16 AM   Specimen: BLOOD LEFT HAND  Result Value Ref Range Status   Specimen Description BLOOD LEFT HAND  Final   Special Requests   Final    BOTTLES DRAWN AEROBIC ONLY Blood Culture results may not be optimal due to an inadequate volume of blood received in culture bottles   Culture   Final    NO GROWTH 5 DAYS Performed at [facility], [place] [address] [place], [place] [address]    Report Status 05/14/2021 FINAL  Final  MRSA PCR Screening     Status: None   Collection Time: 05/09/21  1:33 AM   Specimen: Nasopharyngeal  Result Value Ref Range Status   MRSA by PCR NEGATIVE NEGATIVE Final    Comment:        The GeneXpert MRSA Assay (FDA approved for NASAL specimens only), is one component of a comprehensive MRSA colonization surveillance program. It is not intended to diagnose MRSA infection nor to guide or monitor treatment for MRSA infections. Performed at [facility], [place] [address], [place], [place] [address]   Culture, respiratory (tracheal aspirate)     Status: None   Collection Time: 05/09/21  5:07 AM   Specimen: Tracheal Aspirate; Respiratory  Result  Value Ref Range Status   Specimen Description TRACHEAL ASPIRATE  Final   Special Requests NONE  Final   Gram Stain   Final    MODERATE WBC PRESENT,BOTH PMN AND MONONUCLEAR FEW GRAM POSITIVE COCCI IN CHAINS    Culture   Final    Normal respiratory flora-no Staph aureus or Pseudomonas seen Performed at [facility], [address], [place], [place] [address]    Report Status 05/11/2021 FINAL  Final  Urine Culture     Status: None   Collection Time: 05/09/21  8:44 PM   Specimen: Urine, Catheterized  Result Value Ref Range Status   Specimen Description URINE, CATHETERIZED  Final   Special Requests NONE  Final   Culture   Final    NO GROWTH Performed at [facility], [place] [address] [place], [place] [address]    Report Status 05/11/2021 FINAL  Final  Culture, Respiratory w Gram Stain     Status: None   Collection Time: 05/17/21  3:23 PM   Specimen: Tracheal Aspirate; Respiratory  Result Value Ref Range Status   Specimen Description TRACHEAL ASPIRATE  Final   Special Requests NONE  Final   Gram Stain   Final    NO WBC SEEN RARE GRAM POSITIVE COCCI RARE YEAST    Culture   Final    Normal respiratory flora-no Staph aureus or Pseudomonas seen Performed at [facility], [address], [place], [address] [address]    Report Status 05/19/2021 FINAL  Final  Culture, blood (routine x 2)     Status: None   Collection Time: 05/19/21  9:08 AM   Specimen: BLOOD LEFT HAND  Result Value Ref Range Status   Specimen Description BLOOD LEFT HAND  Final   Special Requests   Final    BOTTLES DRAWN AEROBIC ONLY Blood Culture adequate volume   Culture   Final    NO GROWTH 5 DAYS Performed at [facility], [place] [address], [place], [place] [address]    Report Status 05/24/2021 FINAL  Final  Culture, blood (  routine x 2)     Status: None   Collection Time: 05/19/21  9:19 AM   Specimen: BLOOD LEFT HAND  Result Value Ref Range Status   Specimen Description BLOOD LEFT HAND   Final   Special Requests   Final    BOTTLES DRAWN AEROBIC ONLY Blood Culture results may not be optimal due to an inadequate volume of blood received in culture bottles   Culture   Final    NO GROWTH 5 DAYS Performed at [facility], [place] [address] [place], [place] [address]    Report Status 05/24/2021 FINAL  Final  Culture, Urine     Status: None   Collection Time: 05/19/21  7:17 PM   Specimen: Urine, Random  Result Value Ref Range Status   Specimen Description URINE, RANDOM  Final   Special Requests NONE  Final   Culture   Final    NO GROWTH Performed at [facility], [place] [address] [place], [place] [address]    Report Status 05/21/2021 FINAL  Final  MRSA PCR Screening     Status: None   Collection Time: 05/20/21 11:00 AM   Specimen: Nasopharyngeal  Result Value Ref Range Status   MRSA by PCR NEGATIVE NEGATIVE Final    Comment:        The GeneXpert MRSA Assay (FDA approved for NASAL specimens only), is one component of a comprehensive MRSA colonization surveillance program. It is not intended to diagnose MRSA infection nor to guide or monitor treatment for MRSA infections. Performed at [facility], [place] [address], [place], [place] [address]   Culture, blood (routine x 2)     Status: Abnormal   Collection Time: 05/24/21  8:49 AM   Specimen: BLOOD  Result Value Ref Range Status   Specimen Description BLOOD LEFT ANTECUBITAL  Final   Special Requests   Final    BOTTLES DRAWN AEROBIC AND ANAEROBIC Blood Culture adequate volume   Culture  Setup Time   Final    GRAM POSITIVE COCCI IN CLUSTERS ANAEROBIC BOTTLE ONLY CRITICAL RESULT CALLED TO, READ BACK BY AND VERIFIED WITH: PHARMD EMILY SINCLAIR AT 5809 ON 05/25/21 BY KJ    Culture (A)  Final    STAPHYLOCOCCUS EPIDERMIDIS THE SIGNIFICANCE OF ISOLATING THIS ORGANISM FROM A SINGLE SET OF BLOOD CULTURES WHEN MULTIPLE SETS ARE DRAWN IS UNCERTAIN. PLEASE NOTIFY THE MICROBIOLOGY DEPARTMENT WITHIN ONE WEEK IF  SPECIATION AND SENSITIVITIES ARE REQUIRED. Performed at [facility], [place] [address], [place],  [address]    Report Status 05/27/2021 FINAL  Final  Blood Culture ID Panel (Reflexed)     Status: Abnormal   Collection Time: 05/24/21  8:49 AM  Result Value Ref Range Status   Enterococcus faecalis NOT DETECTED NOT DETECTED Final   Enterococcus Faecium NOT DETECTED NOT DETECTED Final   Listeria monocytogenes NOT DETECTED NOT DETECTED Final   Staphylococcus species DETECTED (A) NOT DETECTED Final    Comment: CRITICAL RESULT CALLED TO, READ BACK BY AND VERIFIED WITH: PHARMD EMILY SINCLAIR AT 1256 BY KJ ON 05/25/21    Staphylococcus aureus (BCID) NOT DETECTED NOT DETECTED Final   Staphylococcus epidermidis DETECTED (A) NOT DETECTED Final    Comment: CRITICAL RESULT CALLED TO, READ BACK BY AND VERIFIED WITH: PHARMD EMILY SINCLAIR AT 1256 BY KJ ON 05/25/21 Methicillin (oxacillin) resistant coagulase negative staphylococcus. Possible blood culture contaminant (unless isolated from more than one blood culture draw or clinical case suggests pathogenicity). No antibiotic treatment  is indicated for blood  culture contaminants.    Staphylococcus lugdunensis NOT DETECTED NOT DETECTED Final   Streptococcus species NOT DETECTED NOT DETECTED Final   Streptococcus agalactiae NOT DETECTED NOT DETECTED Final    Comment: CRITICAL RESULT CALLED TO, READ BACK BY AND VERIFIED WITH: PHARMD EMILYH SINCLAIR AT 8416 ON 05/25/21 BY KJ    Streptococcus pneumoniae NOT DETECTED NOT DETECTED Final   Streptococcus pyogenes NOT DETECTED NOT DETECTED Final   A.calcoaceticus-baumannii NOT DETECTED NOT DETECTED Final   Bacteroides fragilis NOT DETECTED NOT DETECTED Final   Enterobacterales NOT DETECTED NOT DETECTED Final   Enterobacter cloacae complex NOT DETECTED NOT DETECTED Final   Escherichia coli NOT DETECTED NOT DETECTED Final   Klebsiella aerogenes NOT DETECTED NOT DETECTED Final   Klebsiella oxytoca NOT  DETECTED NOT DETECTED Final   Klebsiella pneumoniae NOT DETECTED NOT DETECTED Final   Proteus species NOT DETECTED NOT DETECTED Final   Salmonella species NOT DETECTED NOT DETECTED Final   Serratia marcescens NOT DETECTED NOT DETECTED Final   Haemophilus influenzae NOT DETECTED NOT DETECTED Final   Neisseria meningitidis NOT DETECTED NOT DETECTED Final   Pseudomonas aeruginosa NOT DETECTED NOT DETECTED Final   Stenotrophomonas maltophilia NOT DETECTED NOT DETECTED Final   Candida albicans NOT DETECTED NOT DETECTED Final   Candida auris NOT DETECTED NOT DETECTED Final   Candida glabrata NOT DETECTED NOT DETECTED Final   Candida krusei NOT DETECTED NOT DETECTED Final   Candida parapsilosis NOT DETECTED NOT DETECTED Final   Candida tropicalis NOT DETECTED NOT DETECTED Final   Cryptococcus neoformans/gattii NOT DETECTED NOT DETECTED Final   Methicillin resistance mecA/C DETECTED (A) NOT DETECTED Final    Comment: CRITICAL RESULT CALLED TO, READ BACK BY AND VERIFIED WITH: PHARMD EMILY SINCLAIR AT 1256 ON 05/25/21 BY KJ Performed at [facility], [address] [place], [place] [address]   Culture, blood (routine x 2)     Status: None   Collection Time: 05/24/21  8:57 AM   Specimen: BLOOD  Result Value Ref Range Status   Specimen Description BLOOD BLOOD LEFT HAND  Final   Special Requests   Final    BOTTLES DRAWN AEROBIC AND ANAEROBIC Blood Culture adequate volume   Culture   Final    NO GROWTH 5 DAYS Performed at [facility], [place] [address], [place], [place] [address]    Report Status 05/29/2021 FINAL  Final  MRSA Next Gen by PCR, Nasal     Status: None   Collection Time: 05/24/21  9:58 AM  Result Value Ref Range Status   MRSA by PCR Next Gen NOT DETECTED NOT DETECTED Final    Comment: (NOTE) The GeneXpert MRSA Assay (FDA approved for NASAL specimens only), is one component of a comprehensive MRSA colonization surveillance program. It is not intended to diagnose MRSA  infection nor to guide or monitor treatment for MRSA infections. Test performance is not FDA approved in patients less than 35 years old. Performed at [facility], [place] [address], [place], [place] [address]   Culture, Respiratory w Gram Stain     Status: None   Collection Time: 05/24/21 11:00 AM   Specimen: Tracheal Aspirate; Respiratory  Result Value Ref Range Status   Specimen Description TRACHEAL ASPIRATE  Final   Special Requests NONE  Final   Gram Stain   Final    RARE WBC PRESENT, PREDOMINANTLY MONONUCLEAR NO ORGANISMS SEEN    Culture   Final    FEW  Normal respiratory flora-no Staph aureus or Pseudomonas seen Performed at [place] [address], [place], [place] [address]    Report Status 05/26/2021 FINAL  Final    Coagulation Studies: No results for input(s): LABPROT, INR in the last 72 hours.  Urinalysis: No results for input(s): COLORURINE, LABSPEC, PHURINE, GLUCOSEU, HGBUR, BILIRUBINUR, KETONESUR, PROTEINUR, UROBILINOGEN, NITRITE, LEUKOCYTESUR in the last 72 hours.  Invalid input(s): APPERANCEUR    Imaging: DG Abd 1 View  Result Date: 05/30/2021 CLINICAL DATA:  Nasogastric tube placement. EXAM: ABDOMEN - 1 VIEW COMPARISON:  None. FINDINGS: Enteric tube with tip overlying the expected region of the gastric lumen and side port in the region of the gastroesophageal junction. The bowel gas pattern is normal. No radio-opaque calculi or other significant radiographic abnormality are seen. Aorto bi-iliac stent graft noted. Partially visualized cardiac device leads. IMPRESSION: Enteric tube with tip overlying the expected region of the gastric lumen and side port in the region of the gastroesophageal junction. If side-port desired within the gastric lumen, consider advancing by 3 cm. Electronically Signed   By: Iven Finn M.D.   On: 05/30/2021 22:51   DG CHEST PORT 1 VIEW  Result Date: 05/30/2021 CLINICAL DATA:  Feeding tube placement EXAM: PORTABLE  CHEST 1 VIEW COMPARISON:  05/28/2021, 05/11/2021, CT chest 12/22/2020 FINDINGS: Endotracheal tube is not identified. Post sternotomy changes left-sided pacing device. Right IJ catheter sheath over the SVC origin. Esophageal tube tip overlies the proximal stomach.stable architectural distortion and spiculated opacities in the upper lobes. Patchy airspace disease at the left base. Cardiomegaly. Right upper extremity central venous catheter tip over the SVC. IMPRESSION: 1. Removal of endotracheal tube. Esophageal tube tip below the diaphragm, likely over proximal gastric region 2. Little change in patchy left lung base airspace disease. Similar architectural distortion and spiculated opacities in the upper lobes corresponding to findings on chest CT from January. 3. Cardiomegaly Electronically Signed   By: Donavan Foil M.D.   On: 05/30/2021 22:47     Medications:     prismasol BGK 4/2.5 500 mL/hr at 05/31/21 1549    prismasol BGK 4/2.5 200 mL/hr at 05/31/21 1951   sodium chloride Stopped (05/31/21 0857)   sodium chloride     dextrose     feeding supplement (VITAL 1.5 CAL) 55 mL/hr at 06/01/21 0600   HYDROmorphone     prismasol BGK 4/2.5 2,000 mL/hr at 05/31/21 1704    amiodarone  200 mg Per Tube Daily   arformoterol  15 mcg Nebulization BID   aspirin  81 mg Per Tube Daily   atorvastatin  10 mg Per Tube Daily   B-complex with vitamin C  1 tablet Per Tube Daily   budesonide (PULMICORT) nebulizer solution  0.5 mg Nebulization BID   chlorhexidine gluconate (MEDLINE KIT)  15 mL Mouth Rinse BID   Chlorhexidine Gluconate Cloth  6 each Topical Q0600   feeding supplement (PROSource TF)  90 mL Per Tube TID   insulin aspart  1-3 Units Subcutaneous Q4H   insulin aspart  7 Units Subcutaneous Q4H   insulin detemir  22 Units Subcutaneous Q12H   levothyroxine  50 mcg Per Tube Q0600   midodrine  20 mg Per Tube TID   nutrition supplement (JUVEN)  1 packet Per Tube BID BM   pantoprazole sodium  40 mg Per  Tube BID   revefenacin  175 mcg Nebulization Daily   scopolamine  1 patch Transdermal Q72H   sodium chloride flush  10-40 mL Intracatheter  Q12H   [CANCELED] Place/Maintain arterial line **AND** sodium chloride, acetaminophen **OR** acetaminophen, dextrose, diphenhydrAMINE, docusate, fentaNYL (SUBLIMAZE) injection, glycopyrrolate **OR** glycopyrrolate **OR** glycopyrrolate, heparin, HYDROmorphone, LORazepam, metoprolol tartrate, mineral oil-hydrophilic petrolatum, polyethylene glycol, polyvinyl alcohol, senna, sodium chloride, sodium chloride flush  Assessment/ Plan:  Acute kidney injury Baseline serum creatinine 1.5 mg/dL.  Was initiated on CRRT therapy 05/25/2021.  Continues to be oliguric.  Patient now transitioning to hospice care.  Discussions with critical care medicine Acute on chronic systolic heart failure massive volume overload Shock Levophed, midodrine, vancomycin, meropenem Atrial fibrillation/flutter amiodarone, heparin Diabetes mellitus on insulin sliding scale Hypokalemia resolved Hypoxic respiratory failure management per critical care service   LOS: [place] @TODAY @10 :53 AM

## 2021-06-01 NOTE — Progress Notes (Signed)
Nutrition Brief Note  Chart reviewed. Pt now transitioning to comfort care. Cortrak order discontinued No further nutrition interventions planned at this time.  Please re-consult as needed.   Kerman Passey MS, RDN, LDN, CNSC Registered Dietitian III Clinical Nutrition RD Pager and On-Call Pager Number Located in [place]

## 2021-06-01 NOTE — Progress Notes (Signed)
Patient ID: [name], male   DOB: 04-25-1946, 75 y.o.   MRN: [id number]     Advanced Heart Failure Rounding Note  PCP-Cardiologist: None   Subjective:    Underwent DCCV 6/13. Maintaining NSR.  6/20 one way extubation . Antibiotics stopped .Norepi was stopped.   He is extubated. Awake but not following any commands. Minimal urine output.   Respiratory status mildly labored with gurgling   Maintain NSR.    Objective:   Weight Range: 128.3 kg Body mass index is 44.3 kg/m.   Vital Signs:   Temp:  [97.5 F (36.4 C)-100.8 F (38.2 C)] 97.5 F (36.4 C) (06/22 0802) Pulse Rate:  [74-86] 80 (06/22 0812) Resp:  [18-31] 24 (06/22 0812) BP: (95-152)/(39-59) 123/49 (06/22 0600) SpO2:  [90 %-94 %] 93 % (06/22 0600) FiO2 (%):  [28 %] 28 % (06/22 0812) Last BM Date: 06/01/21  Weight change: Filed Weights   05/27/21 0126 05/28/21 0215  Weight: 131.7 kg 128.3 kg    Intake/Output:   Intake/Output Summary (Last 24 hours) at 06/01/2021 0951 Last data filed at 06/01/2021 0600 Gross per 24 hour  Intake 1872.2 ml  Output 1287 ml  Net 585.2 ml       Physical Exam   General:  Ill-appearing. Awake but not following commands HEENT: normal Neck: supple. no JVD. Carotids 2+ bilat; no bruits. No lymphadenopathy or thryomegaly appreciated. Cor: PMI nondisplaced. Regular rate & rhythm. No rubs, gallops or murmurs. Lungs: + rhonchi/gurgling Abdomen: obese soft, nontender, nondistended. No hepatosplenomegaly. No bruits or masses. Good bowel sounds. Extremities: no cyanosis, clubbing, rash, tr edema Neuro: awake but not following commands    Telemetry   AV paced 80 Personally reviewed   Labs    CBC Recent Labs    05/31/21 0415 06/01/21 0505  WBC 11.2* 12.1*  HGB 9.4* 9.2*  HCT 31.3* 29.6*  MCV 103.0* 102.4*  PLT 194 947    Basic Metabolic Panel Recent Labs    05/31/21 0415 05/31/21 1829 06/01/21 0505  NA 137 135 134*  K 5.1 4.6 4.6  CL 100 103 100  CO2 24  27 25   GLUCOSE 170* 113* 173*  BUN 22 31* 46*  CREATININE 0.88 0.93 1.44*  CALCIUM 8.6* 8.6* 8.9  MG 2.6*  --  2.9*  PHOS 3.2 2.8 3.7    Liver Function Tests Recent Labs    05/31/21 1829 06/01/21 0505  ALBUMIN 2.2* 2.1*    No results for input(s): LIPASE, AMYLASE in the last 72 hours. Cardiac Enzymes No results for input(s): CKTOTAL, CKMB, CKMBINDEX, TROPONINI in the last 72 hours.  BNP: BNP (last 3 results) Recent Labs    04/12/2021 2322 05/14/21 0500 05/16/21 0340  BNP 281.2* 216.4* 78.5     ProBNP (last 3 results) No results for input(s): PROBNP in the last 8760 hours.   D-Dimer No results for input(s): DDIMER in the last 72 hours. Hemoglobin A1C No results for input(s): HGBA1C in the last 72 hours. Fasting Lipid Panel No results for input(s): CHOL, HDL, LDLCALC, TRIG, CHOLHDL, LDLDIRECT in the last 72 hours. Thyroid Function Tests No results for input(s): TSH, T4TOTAL, T3FREE, THYROIDAB in the last 72 hours.  Invalid input(s): FREET3  Other results:   Imaging    No results found.   Medications:     Scheduled Medications:  amiodarone  200 mg Per Tube Daily   arformoterol  15 mcg Nebulization BID   aspirin  81 mg Per Tube Daily   atorvastatin  10 mg Per Tube Daily   B-complex with vitamin C  1 tablet Per Tube Daily   budesonide (PULMICORT) nebulizer solution  0.5 mg Nebulization BID   chlorhexidine gluconate (MEDLINE KIT)  15 mL Mouth Rinse BID   Chlorhexidine Gluconate Cloth  6 each Topical Q0600   feeding supplement (PROSource TF)  90 mL Per Tube TID   insulin aspart  1-3 Units Subcutaneous Q4H   insulin aspart  7 Units Subcutaneous Q4H   insulin detemir  22 Units Subcutaneous Q12H   levothyroxine  50 mcg Per Tube Q0600   midodrine  20 mg Per Tube TID   nutrition supplement (JUVEN)  1 packet Per Tube BID BM   pantoprazole sodium  40 mg Per Tube BID   revefenacin  175 mcg Nebulization Daily   scopolamine  1 patch Transdermal Q72H   sodium  chloride flush  10-40 mL Intracatheter Q12H    Infusions:   prismasol BGK 4/2.5 500 mL/hr at 05/31/21 1549    prismasol BGK 4/2.5 200 mL/hr at 05/31/21 1951   sodium chloride Stopped (05/31/21 0857)   sodium chloride     dextrose     feeding supplement (VITAL 1.5 CAL) 55 mL/hr at 06/01/21 0600   furosemide 160 mg (06/01/21 0912)   prismasol BGK 4/2.5 2,000 mL/hr at 05/31/21 1704    PRN Medications: [CANCELED] Place/Maintain arterial line **AND** sodium chloride, acetaminophen (TYLENOL) oral liquid 160 mg/5 mL, dextrose, docusate, fentaNYL (SUBLIMAZE) injection, heparin, LORazepam, metoprolol tartrate, mineral oil-hydrophilic petrolatum, polyethylene glycol, senna, sodium chloride, sodium chloride flush  Assessment/Plan    Shock: Suspect combination of septic shock and cardiogenic shock in setting of recurrent AF.  Echo 05/12/21 EF 20-25% (previous echo EF 35-40%). Bcx NGTD.  Resp cx - rare GPC.  Atrial flutter/RVR likely played a role. S/p DCCV to NSR 6/13 (TEE with EF 20-25%, mild RV dysfunction). Suspect significant vasodilatory/septic shock component predominating. Covered with meropenem (completed 6/20) - Off pressors.  2. 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 Echo 05/12/21 EF 20-25% (previous Echo EF 35-40%).  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 Last cath 1/19 (LAD is patent, other vessels occluded with collaterals from the LAD; SVG to OM and SVG to PDA chronically occluded).  He had progressive renal failure, now on CVVH.  Volume status improved. Remains on CVVHD  - Volume status improved. With CVHD - Doubt ischemia is driving factor. No role for cath in setting of renal failure and poor prognosis  3. Recurrent AF/AFL with RVR: H/o DCCV in 12/21. S/p DCCV 6/13. Remains in NSR.  - In NSR on po amio  4. Acute hypercapenic/hypoxic respiratory failure: H/o COPD. Remains on vent  since 5/29, failed SBT.  Need to try to pull off fluid via CVVH and get him extubated.  Would be poor candidate for trach given his underlying problems. Family does not want trach  - 6/20  one-way extubation.  5. AKI on CKD IV: Baseline creatinine 1.3-1.7.  With AKI and volume overload, CVVH begun.  - Remains on CVVHD. Volume removal per Renal Would not be a good long-term HD candidate.  6. CAD: h/o CABG with occluded grafts.  Last cath 1/19 (LAD is patent, other vessels occluded with collaterals from the LAD; SVG to OM and SVG to PDA chronically occluded) - continue ASA/statin - Doubt ischemia is driving factor. No role for cath in setting of renal failure and poor  prognosis  7. Thrombocytopenia: Resolved. - HIT negative  He remains encephalopathic despite removal of sedation. Renal function not recovering. Very weak. Now with increasing respiratory distress. We have discussed with family at length. I do not see any chance for meaningful recovery here given MSOF. Agree with transition to comfort care.   D/w Dr. Lynetta Mare.   HF team will sign off.  CRITICAL CARE Performed by: [name]  Total critical care time: 45 minutes  Critical care time was exclusive of separately billable procedures and treating other patients.  Critical care was necessary to treat or prevent imminent or life-threatening deterioration.  Critical care was time spent personally by me (independent of midlevel providers or residents) on the following activities: development of treatment plan with patient and/or surrogate as well as nursing, discussions with consultants, evaluation of patient's response to treatment, examination of patient, obtaining history from patient or surrogate, ordering and performing treatments and interventions, ordering and review of laboratory studies, ordering and review of radiographic studies, pulse oximetry and re-evaluation of patient's condition.    [name]  MD 06/01/2021 9:51 AM

## 2021-06-02 ENCOUNTER — Ambulatory Visit: Payer: Medicare Other | Admitting: Internal Medicine

## 2021-06-03 NOTE — Telephone Encounter (Signed)
Dr. Carlis Abbott responded and stated that Dr. Lynetta Mare should sign the paperwork as he was the attending physician at the time. Emailed the forms to him and also sent a secure chat to him today requesting this be signed. -pr

## 2021-06-07 NOTE — Telephone Encounter (Signed)
Sent Darilyn an email requesting [place] to send a update on a fax cover sheet to patient's son FMLA department at [phone] - there was no phone number to call to update the status -pr

## 2021-06-07 NOTE — Telephone Encounter (Signed)
Contacted Dr. Lynetta Mare via second email - also followed up with our director on 06/06/2021 for assistance in getting form signed as patient's son is needed this form back asap. He will follow up with Dr. Lynetta Mare again today if still not signed. -pr

## 2021-06-08 NOTE — Telephone Encounter (Signed)
Rec'd completed form - request [name] to fax to [employer] at [phone]. -pr

## 2021-06-08 NOTE — Telephone Encounter (Signed)
Update sent to [employer] via fax by [place]. Another email sent to [facility] to follow up with Dr. Lynetta Mare. -pr

## 2021-06-09 ENCOUNTER — Telehealth (HOSPITAL_COMMUNITY): Payer: Self-pay | Admitting: *Deleted

## 2021-06-09 NOTE — Telephone Encounter (Signed)
Pts wife called asking for help getting into pts mychart because there were forms she needed especially about his hospital stay. I gave the information to [facility] she will follow up.

## 2021-06-10 DEATH — deceased

## 2021-06-14 ENCOUNTER — Other Ambulatory Visit (HOSPITAL_COMMUNITY): Payer: Self-pay | Admitting: Cardiology

## 2021-06-23 ENCOUNTER — Ambulatory Visit: Payer: Self-pay | Admitting: Radiation Oncology

## 2021-07-02 ENCOUNTER — Other Ambulatory Visit (HOSPITAL_COMMUNITY): Payer: Self-pay | Admitting: Cardiology

## 2021-07-07 ENCOUNTER — Other Ambulatory Visit (HOSPITAL_COMMUNITY): Payer: Self-pay | Admitting: Cardiology

## 2021-07-11 NOTE — Discharge Summary (Signed)
DEATH SUMMARY   Patient Details  Name: [name] MRN: [id number] DOB: [birth year]  Admission/Discharge Information   Admit Date:  05-27-21  Date of Death: Date of Death: 21-Jun-2021  Time of Death: Time of Death: 0120  Length of Stay: March 23, 2023  Referring Physician: [name], MD   Reason(s) for Hospitalization  Congestive heart failure  Diagnoses  Preliminary cause of death:  Secondary Diagnoses (including complications and co-morbidities):  Active Problems:   Respiratory arrest ([place])   Shock ([place])   AKI (acute kidney injury) ([place])   Respiratory failure [facility])   Brief Hospital Course (including significant findings, care, treatment, and services provided and events leading to death)  DAINEL ARCIDIACONO is a 75 y.o. year old male who presented with increasing dyspnea. Required intubation soon after admission.  History of ischemic cardiomyopathy who had been doing well until he developed atrial fibrillation.   Intubated and Impella placed. Cardioverted for suspected tachycardia induced cardiomyopathy.   Eventually extubated but continued to have ongoing dyspnea. Reintubated with plan to remove fluid and attempt extubation again, but patient developed refractory shock and was transitioned to comfort care per family request.   Pertinent Labs and Studies  Significant Diagnostic Studies DG Abd 1 View  Result Date: 05/30/2021 CLINICAL DATA:  Nasogastric tube placement. EXAM: ABDOMEN - 1 VIEW COMPARISON:  None. FINDINGS: Enteric tube with tip overlying the expected region of the gastric lumen and side port in the region of the gastroesophageal junction. The bowel gas pattern is normal. No radio-opaque calculi or other significant radiographic abnormality are seen. Aorto bi-iliac stent graft noted. Partially visualized cardiac device leads. IMPRESSION: Enteric tube with tip overlying the expected region of the gastric lumen and side port in the region of the gastroesophageal junction.  If side-port desired within the gastric lumen, consider advancing by 3 cm. Electronically Signed   By: Iven Finn M.D.   On: 05/30/2021 22:51   DG CHEST PORT 1 VIEW  Result Date: 05/30/2021 CLINICAL DATA:  Feeding tube placement EXAM: PORTABLE CHEST 1 VIEW COMPARISON:  05/28/2021, 05/11/2021, CT chest 12/22/2020 FINDINGS: Endotracheal tube is not identified. Post sternotomy changes left-sided pacing device. Right IJ catheter sheath over the SVC origin. Esophageal tube tip overlies the proximal stomach.stable architectural distortion and spiculated opacities in the upper lobes. Patchy airspace disease at the left base. Cardiomegaly. Right upper extremity central venous catheter tip over the SVC. IMPRESSION: 1. Removal of endotracheal tube. Esophageal tube tip below the diaphragm, likely over proximal gastric region 2. Little change in patchy left lung base airspace disease. Similar architectural distortion and spiculated opacities in the upper lobes corresponding to findings on chest CT from January. 3. Cardiomegaly Electronically Signed   By: Donavan Foil M.D.   On: 05/30/2021 22:47   DG Chest Port 1 View  Result Date: 05/28/2021 CLINICAL DATA:  Respiratory failure. EXAM: PORTABLE CHEST 1 VIEW COMPARISON:  05/27/2021 and CT chest 12/22/2020 FINDINGS: Endotracheal tube is roughly 4.5 cm above the carina. Nasogastric tube extends into the abdomen. 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 Persistent patchy densities throughout the right lung. Persistent consolidation and opacities at the left lung base. Focal opacity in the left apical region is compatible with known disease/changes from previous CT. Right jugular dialysis catheter with the tip in the upper SVC region. Negative for pneumothorax. Right arm PICC line and difficult to see the PICC line tip. IMPRESSION: Low lung volumes with patchy bilateral lung densities particularly at the left lung base. Cannot exclude left  pleural fluid. Chest findings have  minimally changed since 05/27/2021. Support apparatuses as described. Electronically Signed   By: Markus Daft M.D.   On: 05/28/2021 09:52   DG CHEST PORT 1 VIEW  Addendum Date: 05/27/2021   ADDENDUM REPORT: 05/27/2021 08:03 ADDENDUM: It may be prudent to consider withdrawing endotracheal tube approximately 3 cm. Electronically Signed   By: Lowella Grip III M.D.   On: 05/27/2021 08:03   Result Date: 05/27/2021 CLINICAL DATA:  Hypoxia EXAM: PORTABLE CHEST 1 VIEW COMPARISON:  May 25, 2021. FINDINGS: Endotracheal tube tip is at the carina. Peripherally inserted central catheter tip in superior vena cava. Right jugular catheter tip in superior vena cava. Pacemaker leads attached to right atrium, right ventricle, and coronary sinus. Nasogastric tube tip and side port below diaphragm. No pneumothorax. There is patchy airspace opacity in the left base with equivocal left pleural effusion. Slight right base atelectasis. There is also a right upper lobe atelectasis. Heart is mildly enlarged with pulmonary vascularity normal. No adenopathy. No bone lesions. There is aortic atherosclerosis. IMPRESSION: Tube and catheter positions as described without pneumothorax. Patchy airspace opacity left base, likely representing combination of atelectasis and pneumonia with small left pleural effusion. Areas of atelectasis in the right base and right upper lobe regions. Stable cardiac prominence. Aortic Atherosclerosis (ICD10-I70.0). Electronically Signed: By: Lowella Grip III M.D. On: 05/27/2021 07:59   DG Chest Port 1 View  Result Date: 05/25/2021 CLINICAL DATA:  Central line placement for dialysis EXAM: PORTABLE CHEST 1 VIEW COMPARISON:  05/24/2021 FINDINGS: Right jugular dual lumen catheter tip in the innominate vein, just above the SVC. No pneumothorax Right arm PICC tip in the proximal SVC. Endotracheal tube in good position. NG tube in place. AICD unchanged in position Cardiac enlargement without pulmonary  edema. Postsurgical changes right upper lobe with surrounding soft tissue density unchanged. Mild bibasilar airspace disease, unchanged. IMPRESSION: Right jugular dual lumen catheter in the lower innominate vein on the right. No pneumothorax Persistent bibasilar airspace disease unchanged. Electronically Signed   By: Franchot Gallo M.D.   On: 05/25/2021 14:52   DG CHEST PORT 1 VIEW  Result Date: 05/24/2021 CLINICAL DATA:  75 year old male with respiratory failure, hypoxia. Treated right lung cancer. EXAM: PORTABLE CHEST 1 VIEW COMPARISON:  Portable chest 05/22/2021 and earlier. FINDINGS: Portable AP semi upright view at 0755 hours. Stable left chest AICD. Stable cardiomegaly and mediastinal contours. Intubated. Endotracheal tube tip between the clavicles and carina. Enteric tube courses to the abdomen, tip not included. Stable right PICC line. Stable surgical clips and architectural distortion in the right upper lobe. Stable similar architectural distortion in the left upper lobe. No pneumothorax or pulmonary edema. No definite pleural effusion. But increasing retrocardiac opacity partially obscuring the left hemidiaphragm now. IMPRESSION: 1.  Stable lines and tubes. 2. Increasing left lower lobe opacity since 05/22/2021 could be pneumonia or recurrent atelectasis. 3. Stable architectural distortion in both upper lobes. Electronically Signed   By: Genevie Ann M.D.   On: 05/24/2021 08:18   DG CHEST PORT 1 VIEW  Result Date: 05/22/2021 CLINICAL DATA:  PICC line placement. EXAM: PORTABLE CHEST 1 VIEW COMPARISON:  Earlier film, same date. FINDINGS: The endotracheal tube and NG tube is stable. New right-sided PICC line tip is in the mid SVC. Pacer wires/AICD are stable. Stable bilateral upper lobe pulmonary lesions. Stable left basilar process likely infiltrate and or atelectasis. IMPRESSION: New right-sided PICC line with tip in the mid SVC. Electronically Signed   By: Mamie Nick.  Gallerani M.D.   On: 05/22/2021 18:56    DG CHEST PORT 1 VIEW  Result Date: 05/22/2021 CLINICAL DATA:  Respiratory distress, COPD, unresponsive EXAM: PORTABLE CHEST 1 VIEW COMPARISON:  Radiograph 05/18/2021 FINDINGS: Endotracheal tube tip terminates in the mid trachea, 4.5 cm from the carina. Transesophageal tube tip and side port distal to the GE junction. Prior sternotomy and CABG. Left chest wall battery pack with pacer/defibrillator leads at the right atrium and cardiac apex as well as within the coronary sinus. Patient is imaged in a steep right anterior obliquity superimposing much of the cardiac silhouette over the left heart. Stable cardiomediastinal contours. Chronic scarring and opacity in the right mid to upper lung with associated bronchiectatic changes. Biapical pleuroparenchymal scarring and apical pleural thickening is unchanged from comparison accounting for differences in positioning. There is some persistent left basilar opacity which is fairly similar to prior. Some slightly increasing pulmonary vascular congestion and hazy interstitial opacity noted. No pneumothorax. No discernible layering pleural effusion. IMPRESSION: 1. Lines and tubes as above 2. Increasing pulmonary vascular congestion and hazy interstitial opacities along could reflect some slightly worsening pulmonary edema. 3. Retrocardiac opacity is again noted, possibly atelectatic or other pleuroparenchymal process. 4. Chronic scarring in the right mid to upper lung and bilateral apices. Electronically Signed   By: Lovena Le M.D.   On: 05/22/2021 03:23   DG CHEST PORT 1 VIEW  Result Date: 05/18/2021 CLINICAL DATA:  Intubation.  COPD.  Respiratory failure. EXAM: PORTABLE CHEST 1 VIEW COMPARISON:  05/17/2021.  CT 12/22/2020. FINDINGS: Endotracheal tube, NG tube, right central line stable position. AICD in stable position. Prior CABG. Stable cardiomegaly. Diffuse bilateral interstitial prominence again noted suggesting interstitial edema. Low lung volumes with  persistent bibasilar atelectatic changes. Left apical mass again noted. Biapical densities are again noted. Reference is made to prior CT report of 12/22/2020. No prominent pleural effusion or pneumothorax. IMPRESSION: 1.  Lines and tubes in stable position. 2.  AICD in stable position.  Prior CABG.  Cardiomegaly again noted. 3. Diffuse bilateral interstitial prominence suggesting interstitial edema. 4. Low lung volumes with persistent bibasilar and right upper lung atelectatic changes. Biapical densities are again noted. Reference is made to prior CT report of 12/22/2020. Electronically Signed   By: Marcello Moores  Register   On: 05/18/2021 06:56   DG CHEST PORT 1 VIEW  Result Date: 05/17/2021 CLINICAL DATA:  Tachycardia. EXAM: PORTABLE CHEST 1 VIEW COMPARISON:  05/14/2021 FINDINGS: Significant patient rotation. Endotracheal tube tip is approximately 4.4 cm from the carina. Enteric tube is in place. Post median sternotomy and CABG. Left-sided pacemaker, only partially included in the field of view. Right central line unchanged. Multiple overlying monitoring devices. Stable cardiomegaly. Stable right perihilar opacity with fiducial markers. Stable left apical nodular density. Retrocardiac opacity likely combination of pleural fluid and airspace disease/atelectasis. Similar interstitial thickening in the right lung. No pneumothorax. IMPRESSION: 1. Stable interstitial opacities throughout the right lung, likely asymmetric pulmonary edema. 2. Retrocardiac opacity is similar, likely combination of pleural fluid and atelectasis/airspace disease. 3. Stable support apparatus. Electronically Signed   By: Keith Rake M.D.   On: 05/17/2021 18:43   DG CHEST PORT 1 VIEW  Result Date: 05/14/2021 CLINICAL DATA:  Acute respiratory failure with hypoxia. EXAM: PORTABLE CHEST 1 VIEW COMPARISON:  05/12/2021 FINDINGS: Endotracheal tube remains. Tip somewhat difficult to delineate but is felt to be roughly 3 cm above the carina.  Gastric decompression tube continues to extend below the diaphragm. Stable appearance of biventricular pacing/ICD device  and right jugular central venous catheter. Overall pattern of pulmonary edema has improved since the prior radiograph. Residual opacities in both lower lung fields, right greater than left may be consistent with underlying pneumonia. Stable chronic scarring and opacity in the right perihilar upper lobe and left upper lobe related to prior treatment lung cancer. IMPRESSION: Decrease in pulmonary edema. Residual airspace opacities in both lower lungs may be consistent with underlying pneumonia. Electronically Signed   By: Aletta Edouard M.D.   On: 05/14/2021 08:51   [place] EKG SITE RITE  Result Date: 05/22/2021 If Site Rite image not attached, placement could not be confirmed due to current cardiac rhythm.   Microbiology No results found for this or any previous visit (from the past 240 hour(s)).  Lab Basic Metabolic Panel: No results for input(s): NA, K, CL, CO2, GLUCOSE, BUN, CREATININE, CALCIUM, MG, PHOS in the last 168 hours. Liver Function Tests: No results for input(s): AST, ALT, ALKPHOS, BILITOT, PROT, ALBUMIN in the last 168 hours. No results for input(s): LIPASE, AMYLASE in the last 168 hours. No results for input(s): AMMONIA in the last 168 hours. CBC: No results for input(s): WBC, NEUTROABS, HGB, HCT, MCV, PLT in the last 168 hours. Cardiac Enzymes: No results for input(s): CKTOTAL, CKMB, CKMBINDEX, TROPONINI in the last 168 hours. Sepsis Labs: No results for input(s): PROCALCITON, WBC, LATICACIDVEN in the last 168 hours.  Procedures/Operations  Mechanical ventilation, CRRT, Impella, Cardioversion   Tasnia Spegal 06/12/2021, 12:46 PM

## 2021-11-26 IMAGING — DX DG CHEST 1V PORT
1 series · 1 of 1 positions shown · non-contrast
Comparison: 05/14/2021

CLINICAL DATA: Tachycardia.

EXAM:
PORTABLE CHEST 1 VIEW

[chest]
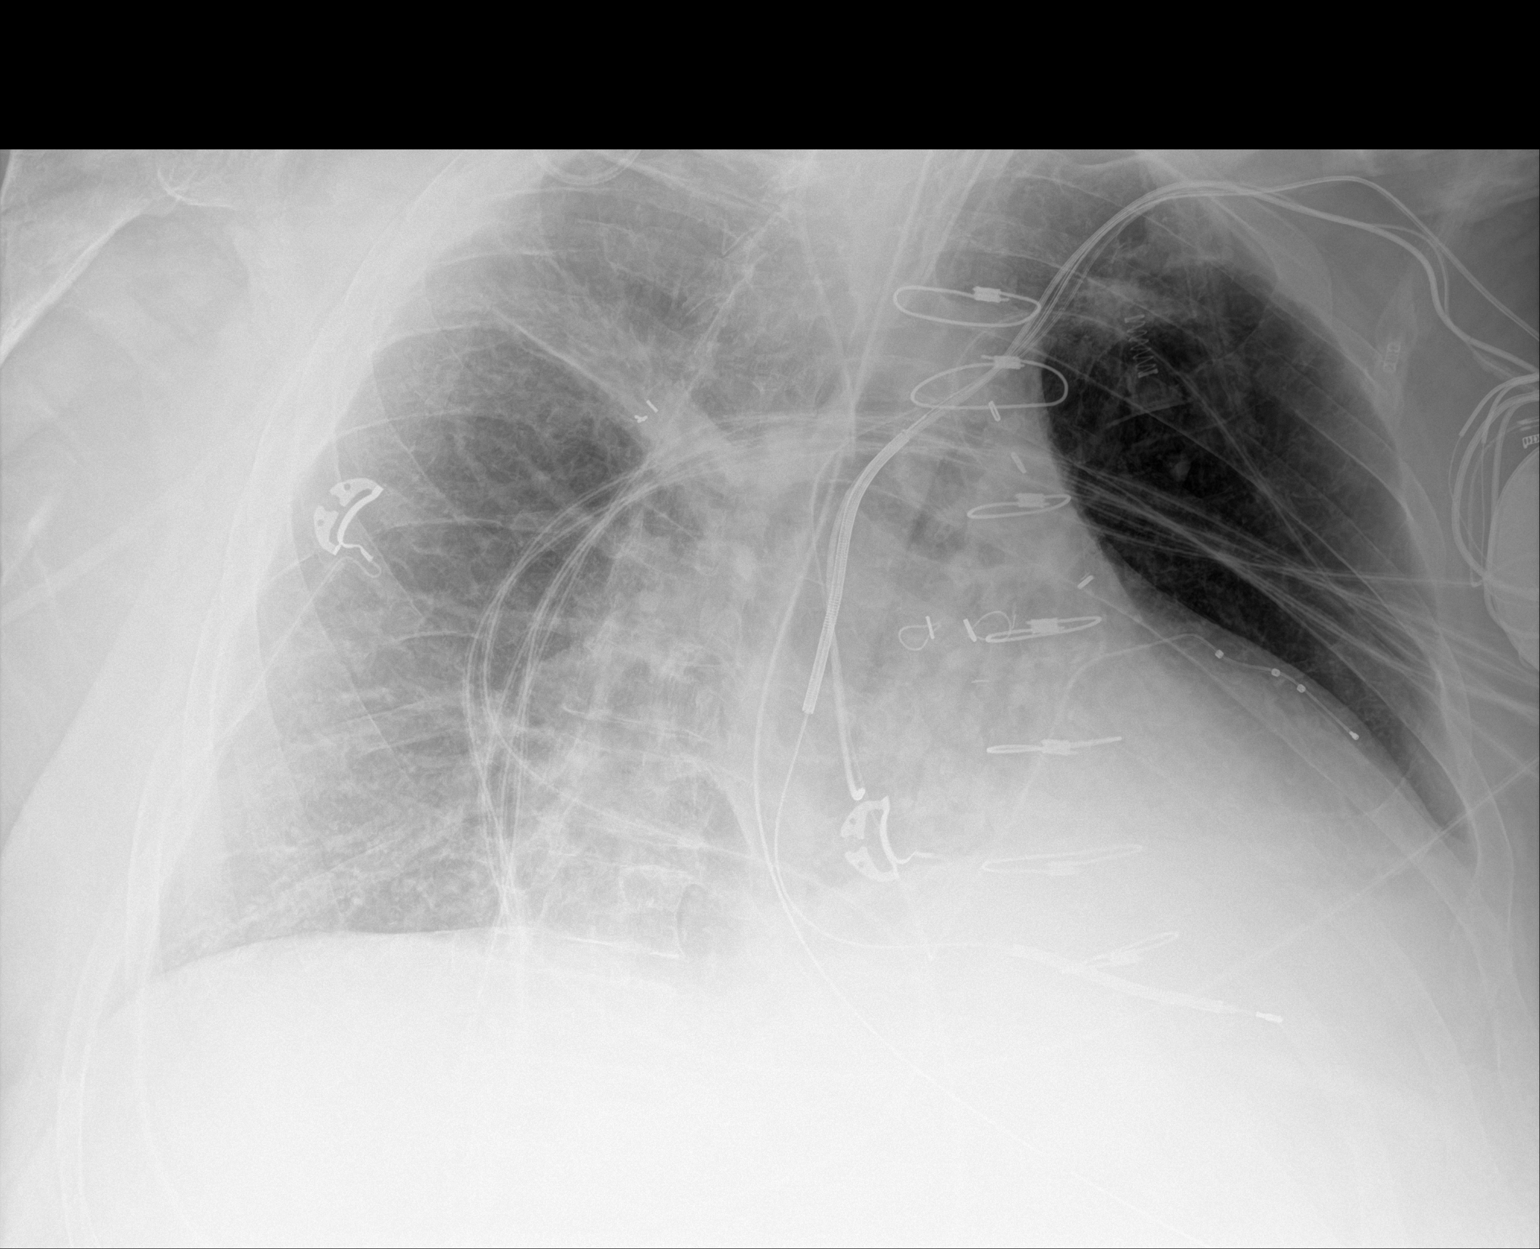

[1 of 1 positions shown; findings below may reference images not displayed]

FINDINGS: Significant patient rotation. Endotracheal tube tip is approximately
4.4 cm from the carina. Enteric tube is in place. Post median
sternotomy and CABG. Left-sided pacemaker, only partially included
in the field of view. Right central line unchanged. Multiple
overlying monitoring devices.

Stable cardiomegaly. Stable right perihilar opacity with fiducial
markers. Stable left apical nodular density. Retrocardiac opacity
likely combination of pleural fluid and airspace
disease/atelectasis. Similar interstitial thickening in the right
lung. No pneumothorax.
IMPRESSION: 1. Stable interstitial opacities throughout the right lung, likely
asymmetric pulmonary edema.
2. Retrocardiac opacity is similar, likely combination of pleural
fluid and atelectasis/airspace disease.
3. Stable support apparatus.

## 2021-12-06 IMAGING — DX DG CHEST 1V PORT
1 series · 2 of 2 positions shown · non-contrast
Comparison: May 25, 2021.
COMPARISON: May 25, 2021.

Addendum:
CLINICAL DATA: Hypoxia

EXAM:
PORTABLE CHEST 1 VIEW

[Series 1: chest · 0.14mm/px · 2 of 2 slices shown]
[im 1/2]
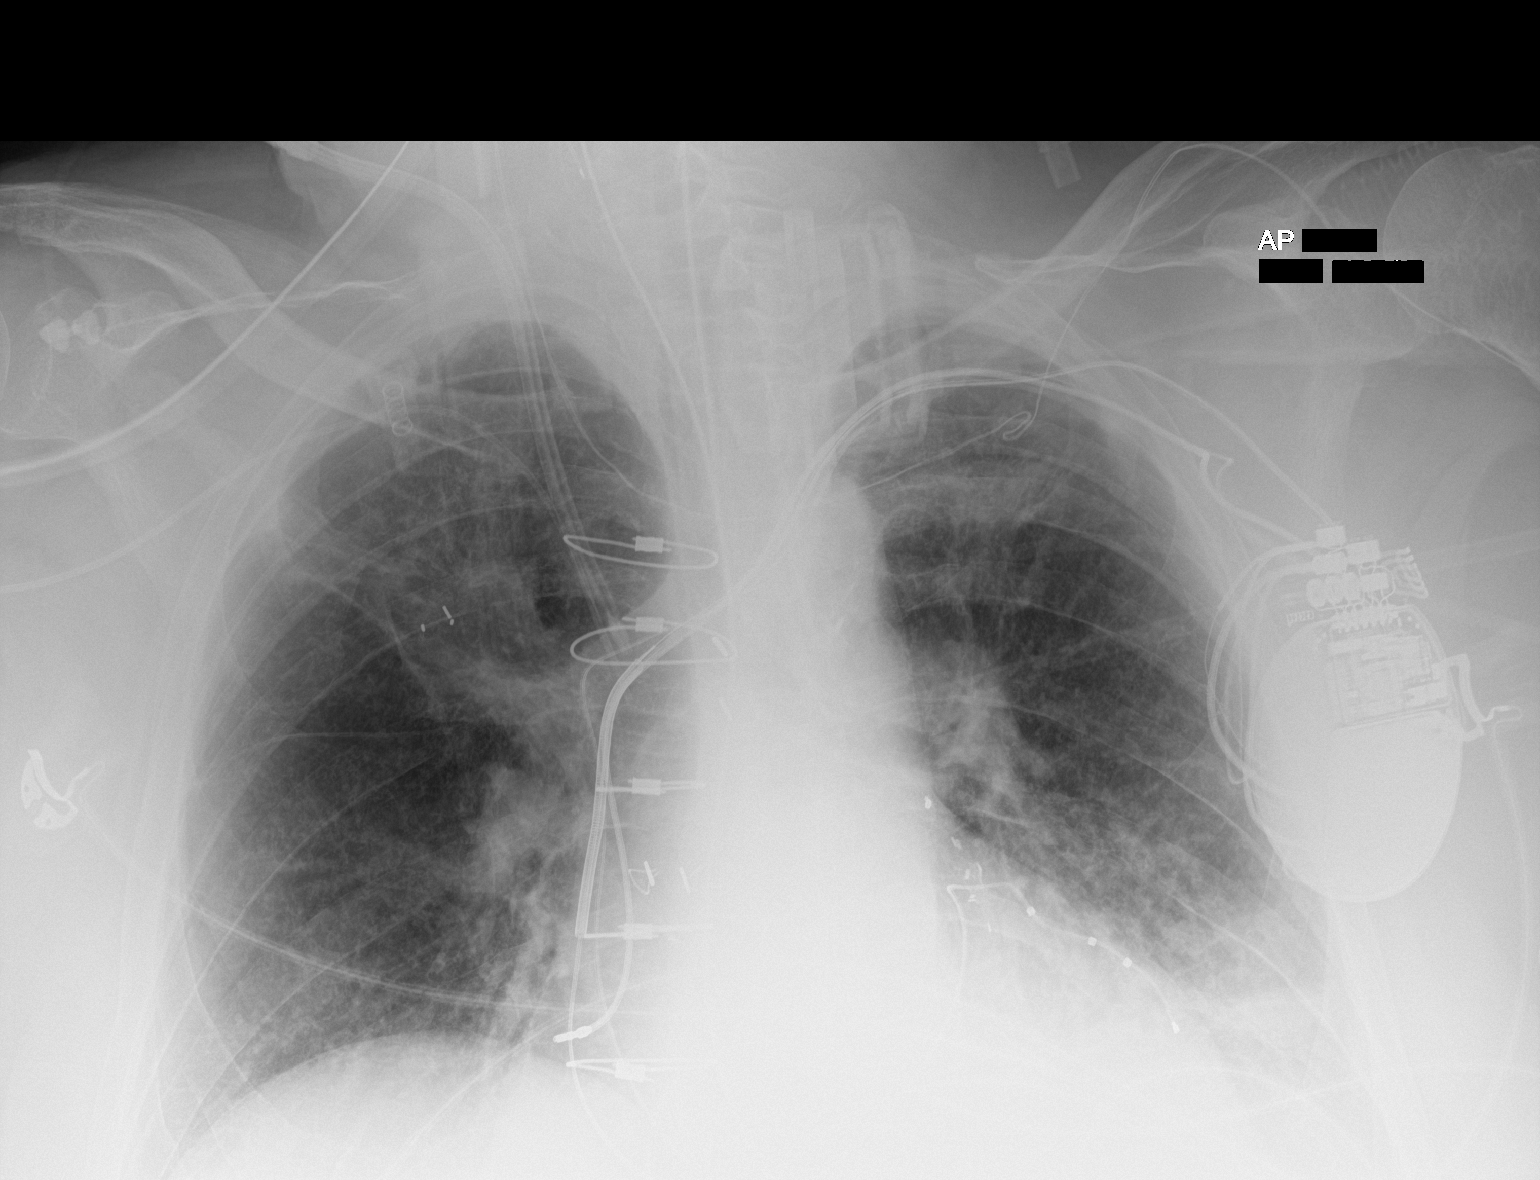
[im 2/2]
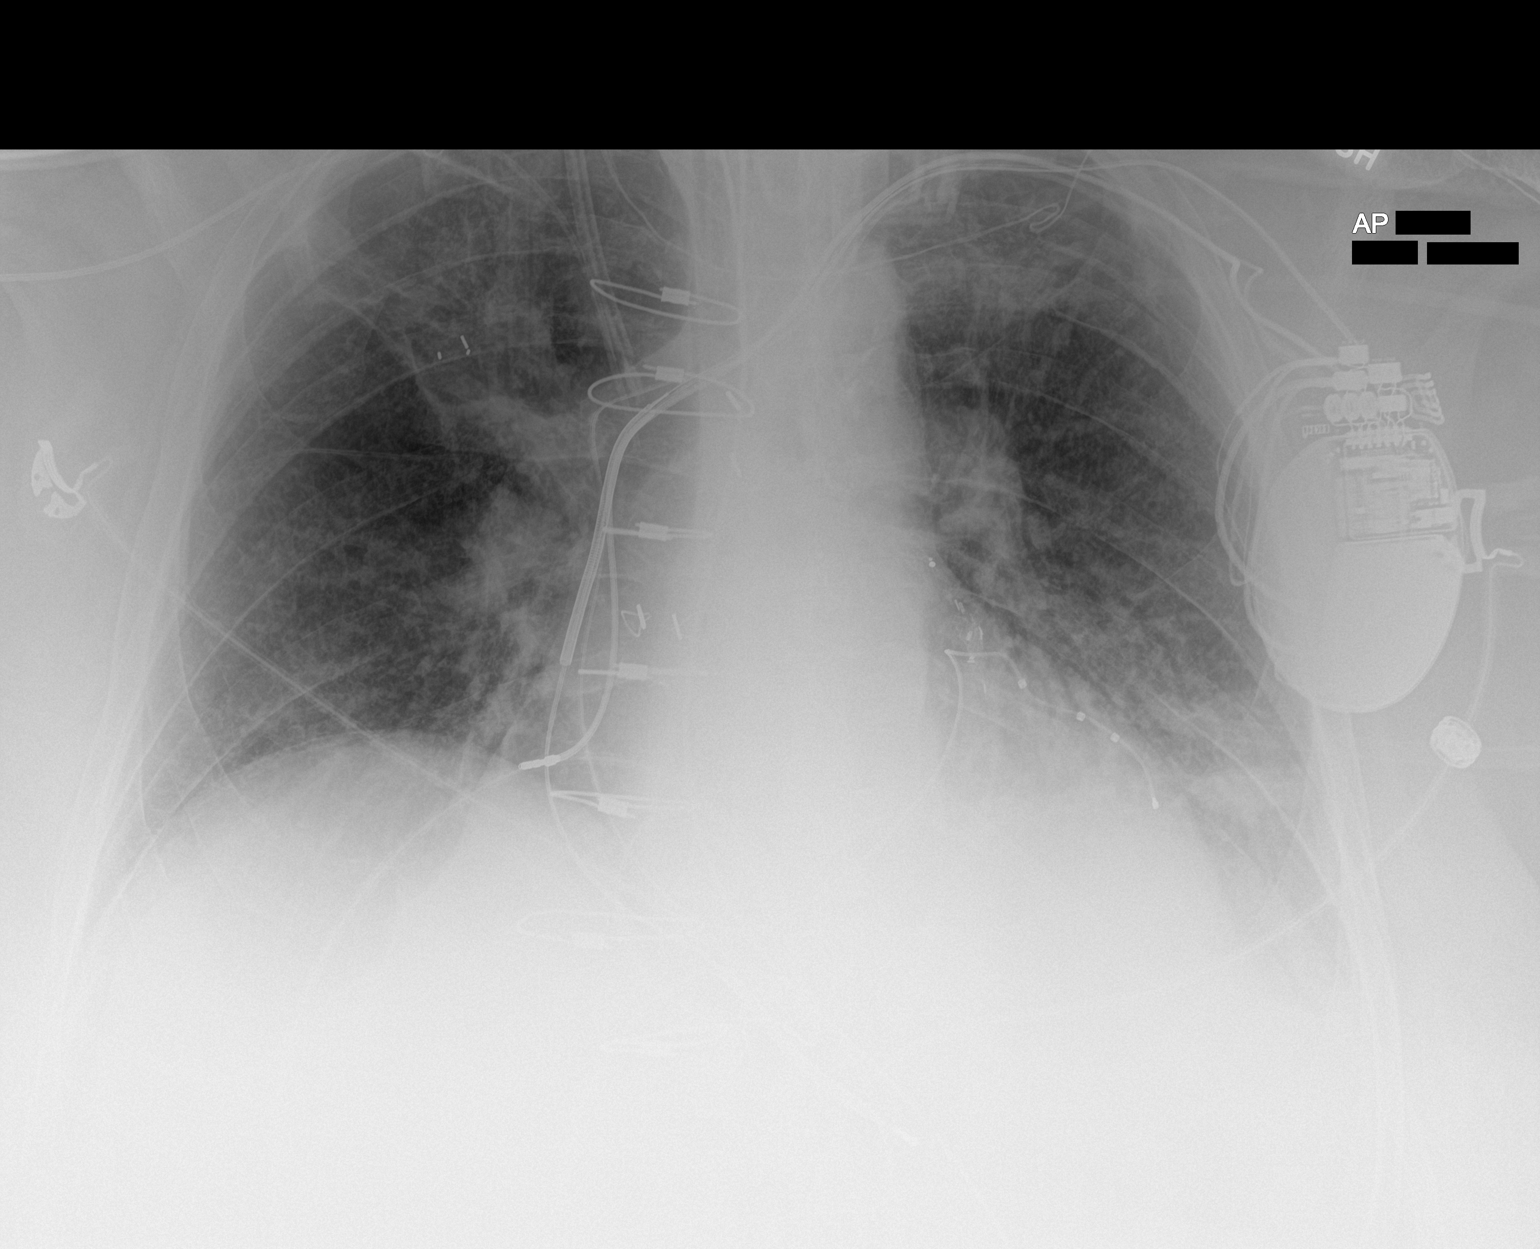

[2 of 2 positions shown; findings below may reference images not displayed]

FINDINGS: Endotracheal tube tip is at the carina. Peripherally inserted
central catheter tip in superior vena cava. Right jugular catheter
tip in superior vena cava. Pacemaker leads attached to right atrium,
right ventricle, and coronary sinus. Nasogastric tube tip and side
port below diaphragm. No pneumothorax. There is patchy airspace
opacity in the left base with equivocal left pleural effusion.
Slight right base atelectasis. There is also a right upper lobe
atelectasis. Heart is mildly enlarged with pulmonary vascularity
normal. No adenopathy. No bone lesions. There is aortic
atherosclerosis.
IMPRESSION: Tube and catheter positions as described without pneumothorax.
Patchy airspace opacity left base, likely representing combination
of atelectasis and pneumonia with small left pleural effusion. Areas
of atelectasis in the right base and right upper lobe regions.
Stable cardiac prominence. Aortic Atherosclerosis ([id number]).

ADDENDUM:
It may be prudent to consider withdrawing endotracheal tube
approximately 3 cm.

*** End of Addendum ***
FINDINGS: Endotracheal tube tip is at the carina. Peripherally inserted
central catheter tip in superior vena cava. Right jugular catheter
tip in superior vena cava. Pacemaker leads attached to right atrium,
right ventricle, and coronary sinus. Nasogastric tube tip and side
port below diaphragm. No pneumothorax. There is patchy airspace
opacity in the left base with equivocal left pleural effusion.
Slight right base atelectasis. There is also a right upper lobe
atelectasis. Heart is mildly enlarged with pulmonary vascularity
normal. No adenopathy. No bone lesions. There is aortic
atherosclerosis.
IMPRESSION: Tube and catheter positions as described without pneumothorax.
Patchy airspace opacity left base, likely representing combination
of atelectasis and pneumonia with small left pleural effusion. Areas
of atelectasis in the right base and right upper lobe regions.
Stable cardiac prominence. Aortic Atherosclerosis ([id number]).

## 2021-12-07 IMAGING — DX DG CHEST 1V PORT
1 series · 2 of 2 positions shown · non-contrast
Comparison: 05/27/[address] chest 12/22/2020

CLINICAL DATA: Respiratory failure.

EXAM:
PORTABLE CHEST 1 VIEW

[Series 1: chest ap · 0.14mm/px · 2 of 2 slices shown]
[im 1/2]
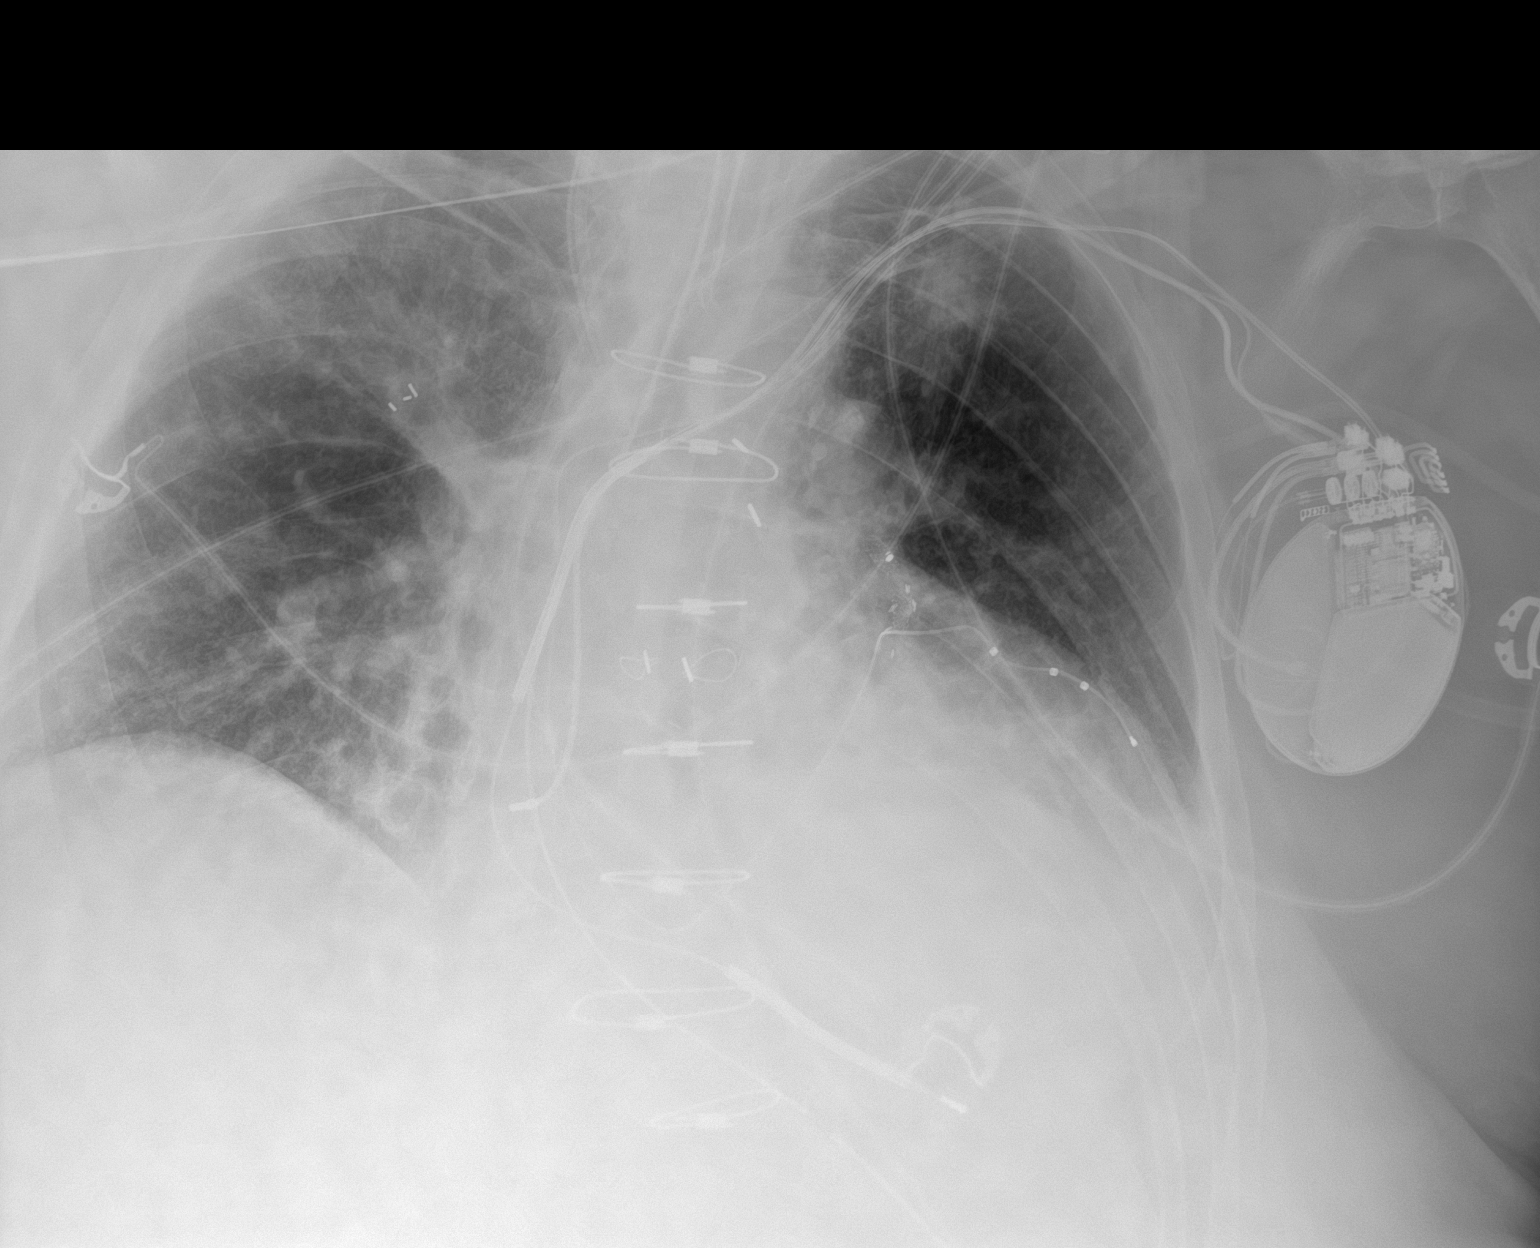
[im 2/2]
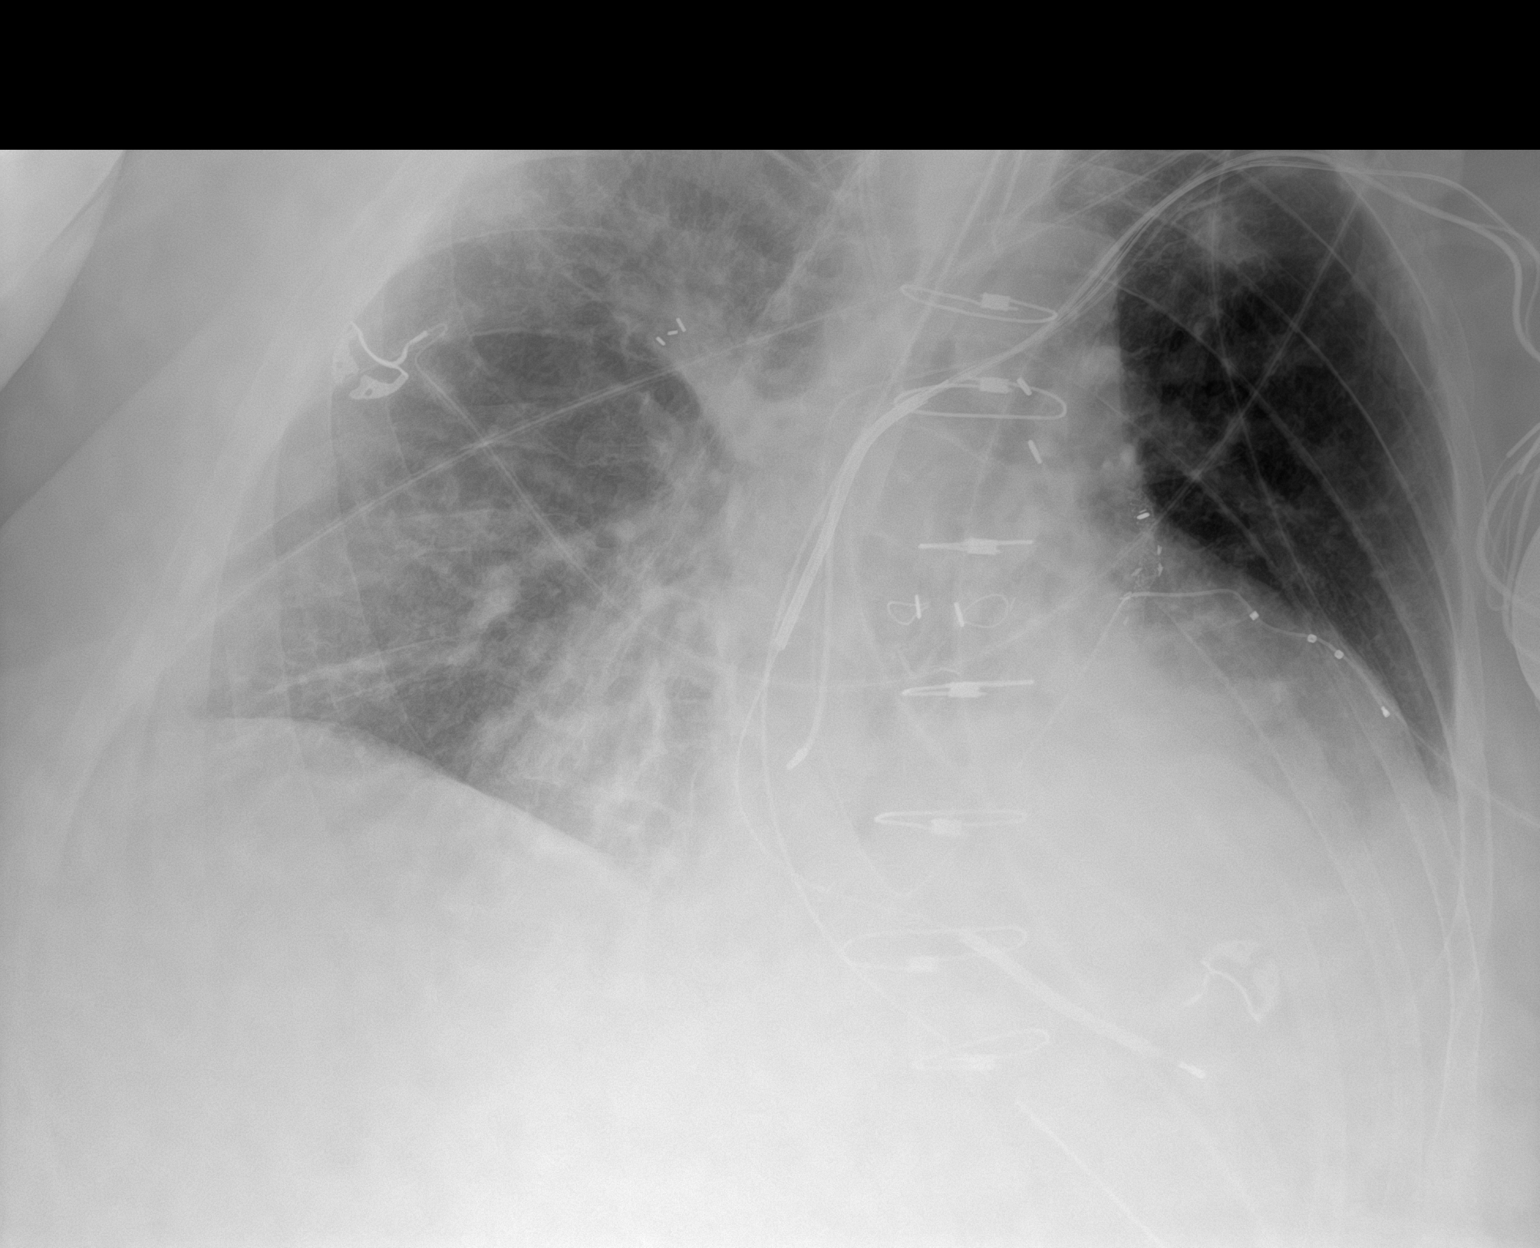

[2 of 2 positions shown; findings below may reference images not displayed]

FINDINGS: Endotracheal tube is roughly 4.5 cm above the carina. Nasogastric
tube extends into the abdomen. There is a left cardiac ICD.
Persistent patchy densities throughout the right lung. Persistent
consolidation and opacities at the left lung base. Focal opacity in
the left apical region is compatible with known disease/changes from
previous CT. Right jugular dialysis catheter with the tip in the
upper SVC region. Negative for pneumothorax. Right arm PICC line and
difficult to see the PICC line tip.
IMPRESSION: Low lung volumes with patchy bilateral lung densities particularly
at the left lung base. Cannot exclude left pleural fluid. Chest
findings have minimally changed since 05/27/2021.

Support apparatuses as described.

## 2021-12-09 IMAGING — DX DG ABDOMEN 1V
1 series · 1 of 1 positions shown · non-contrast
Comparison: None.

CLINICAL DATA: Nasogastric tube placement.

EXAM:
ABDOMEN - 1 VIEW

[abdomen supine]
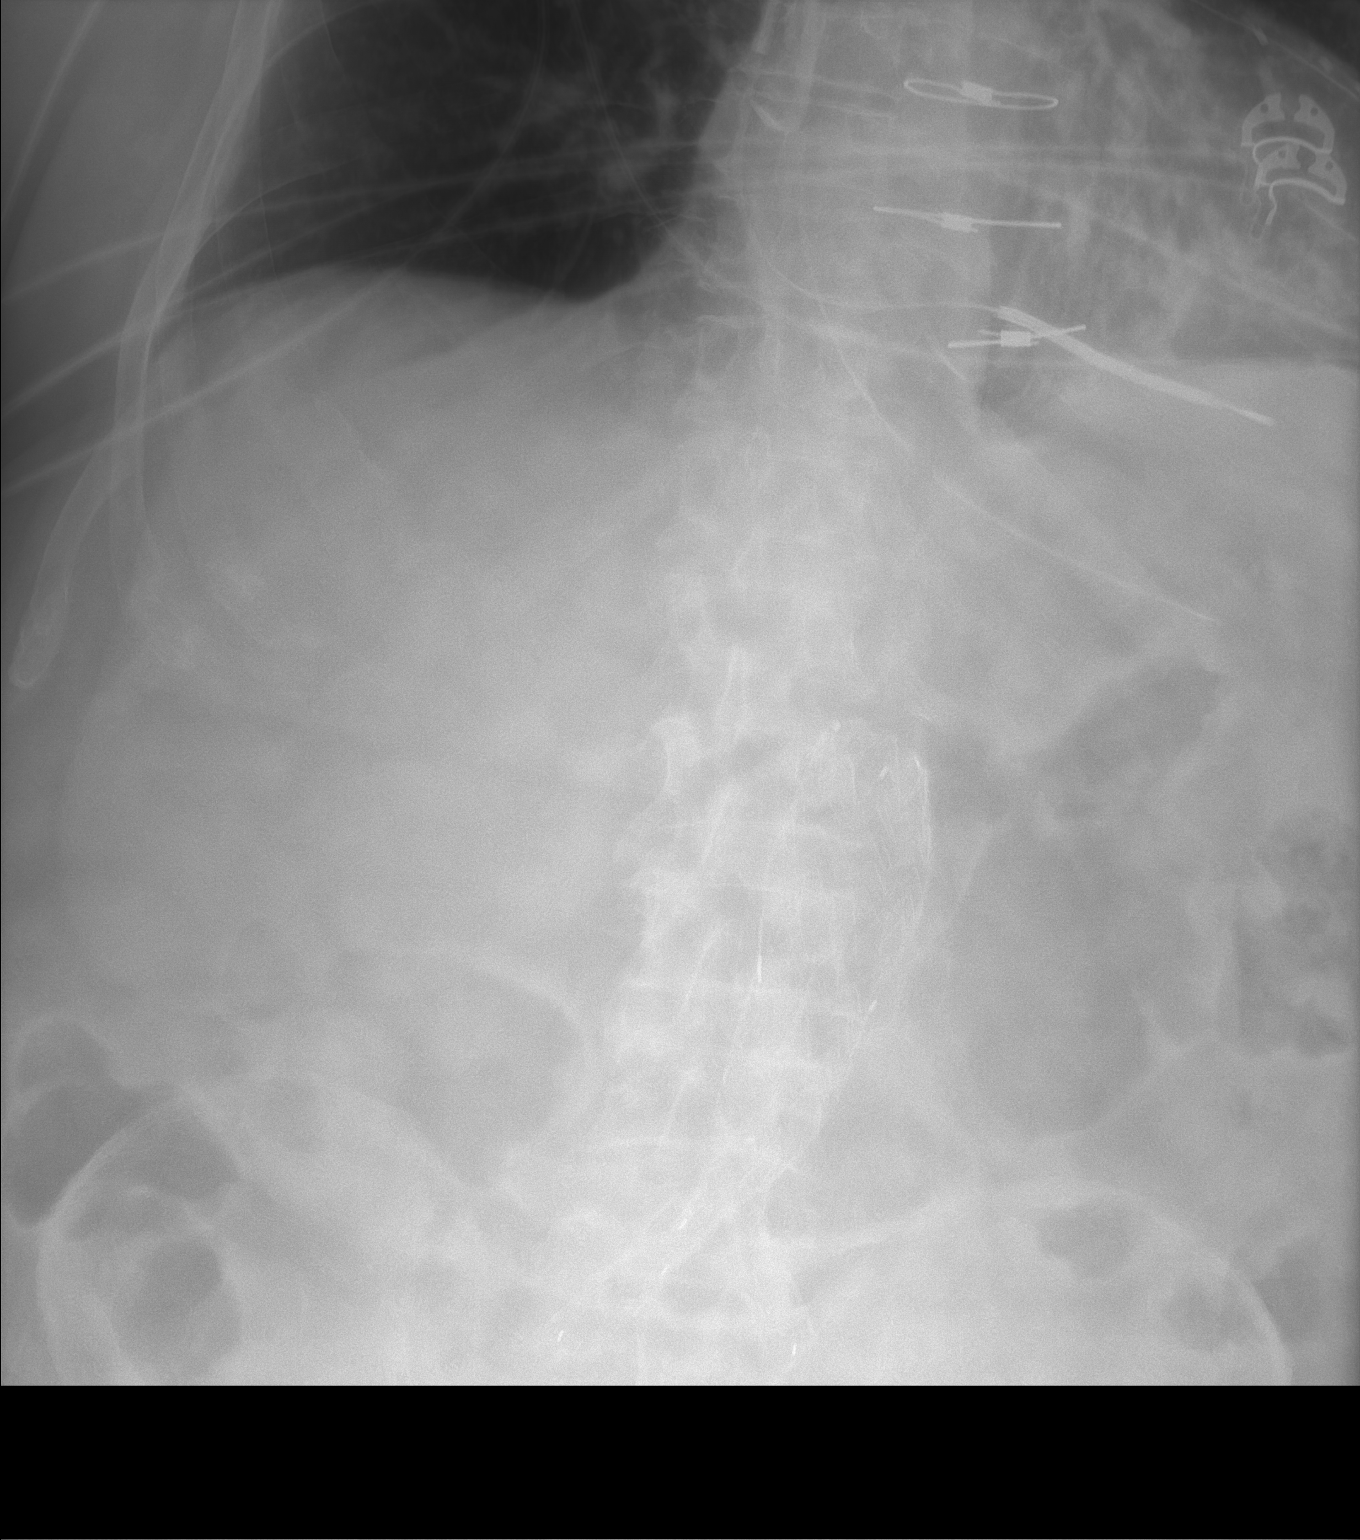

[1 of 1 positions shown; findings below may reference images not displayed]

FINDINGS: Enteric tube with tip overlying the expected region of the gastric
lumen and side port in the region of the gastroesophageal junction.

The bowel gas pattern is normal. No radio-opaque calculi or other
significant radiographic abnormality are seen.

Aorto bi-iliac stent graft noted. Partially visualized cardiac
device leads.
IMPRESSION: Enteric tube with tip overlying the expected region of the gastric
lumen and side port in the region of the gastroesophageal junction.
If side-port desired within the gastric lumen, consider advancing by
3 cm.

## 2021-12-09 IMAGING — DX DG CHEST 1V PORT
1 series · 2 of 2 positions shown · non-contrast
Comparison: 05/28/2021, 05/11/2021, CT chest 12/22/2020

CLINICAL DATA: Feeding tube placement

EXAM:
PORTABLE CHEST 1 VIEW

[Series 1: chest ap · 0.14mm/px · 2 of 2 slices shown]
[im 1/2]
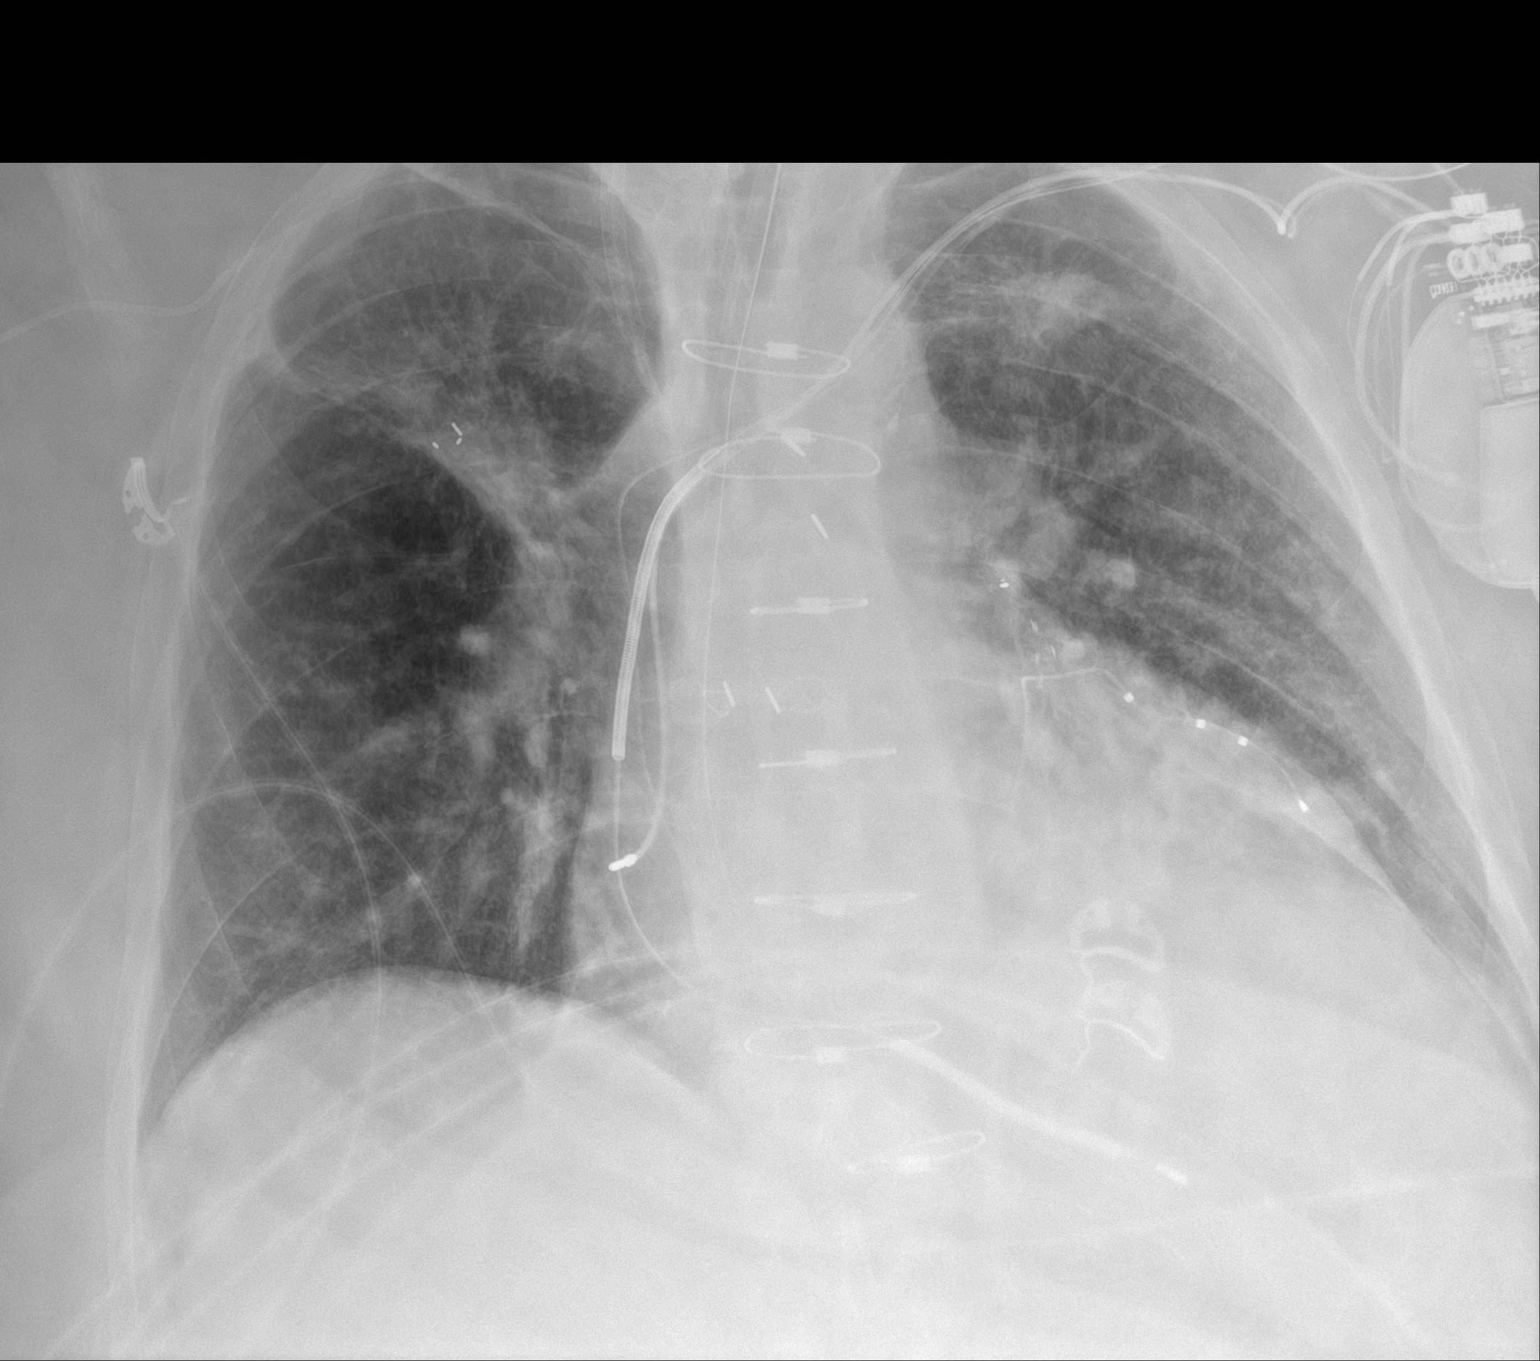
[im 2/2]
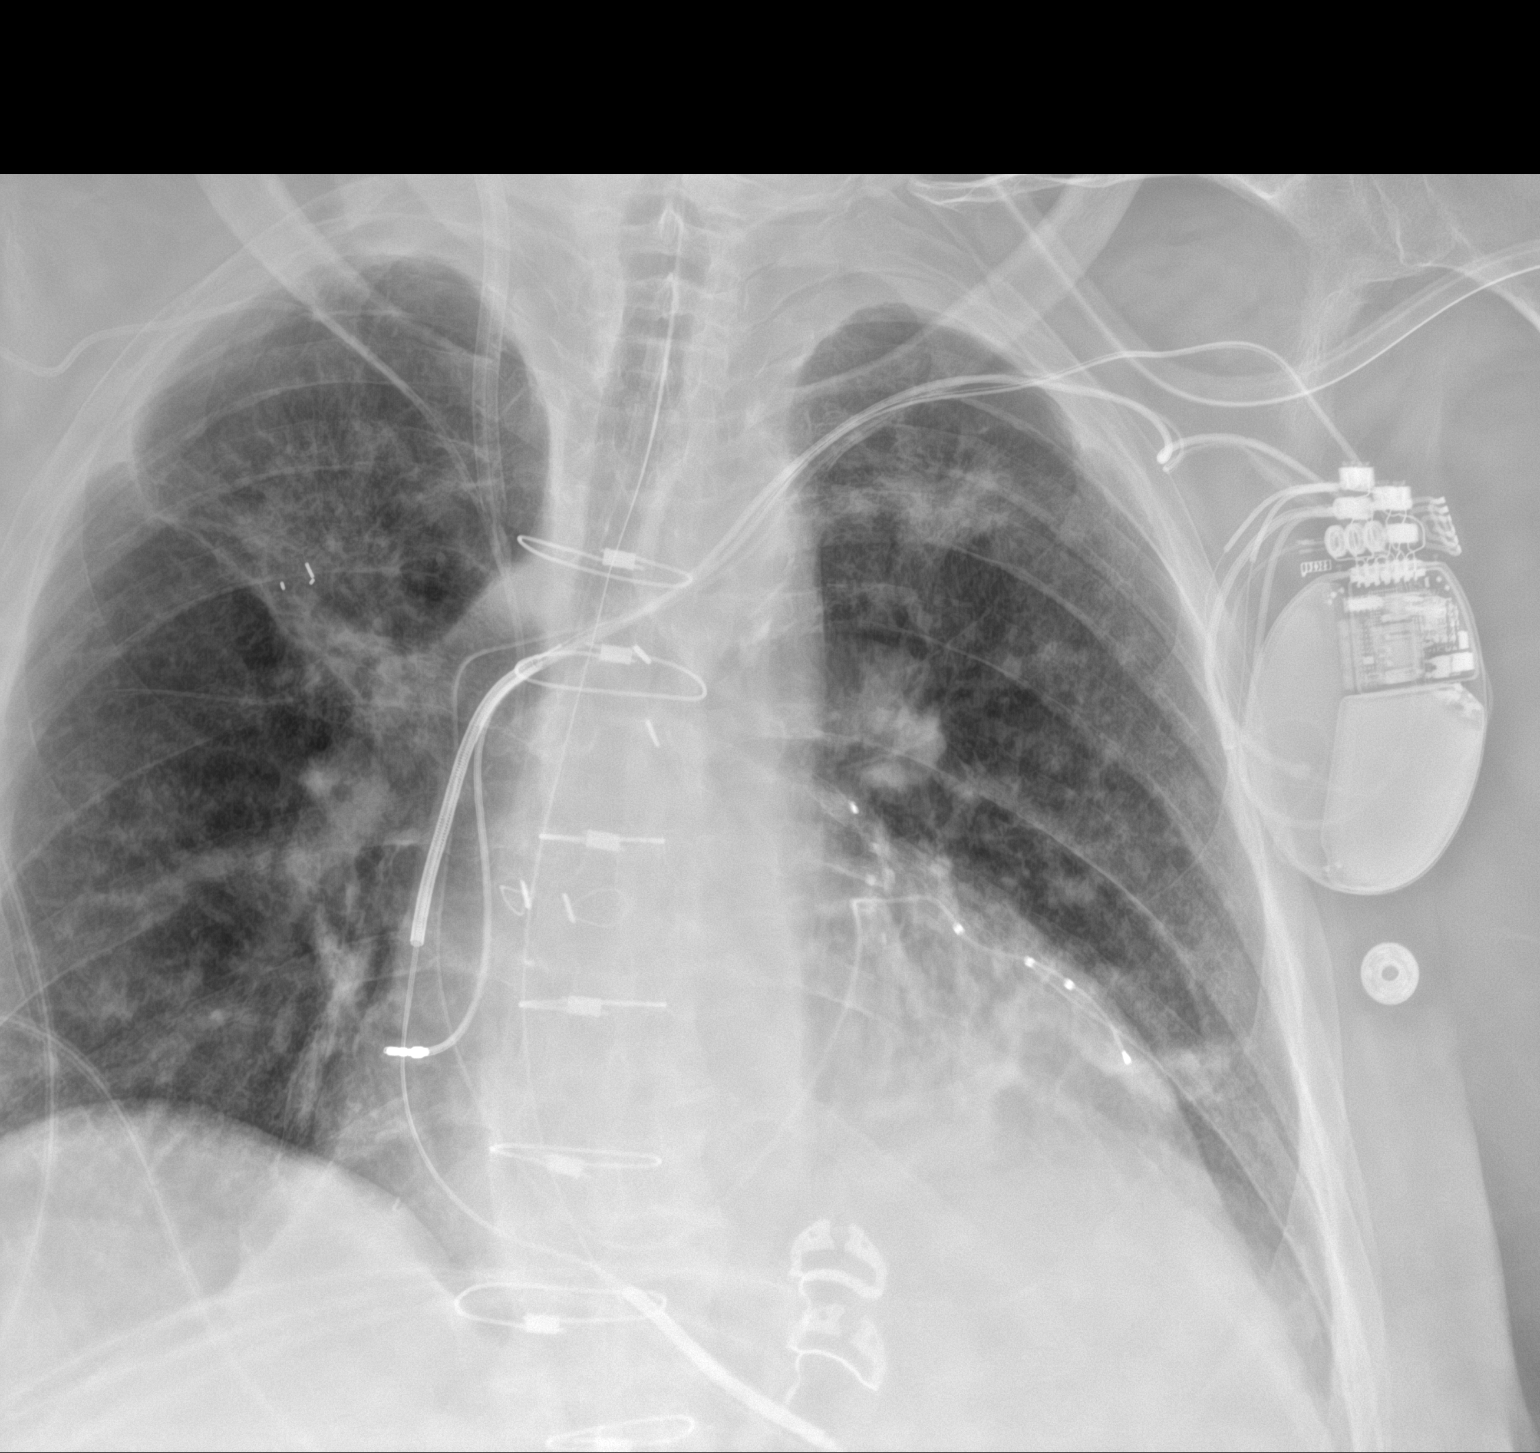

[2 of 2 positions shown; findings below may reference images not displayed]

FINDINGS: Endotracheal tube is not identified. Post sternotomy changes
left-sided pacing device. Right IJ catheter sheath over the SVC
origin. Esophageal tube tip overlies the proximal stomach.stable
architectural distortion and spiculated opacities in the upper
lobes. Patchy airspace disease at the left base. Cardiomegaly. Right
upper extremity central venous catheter tip over the SVC.
IMPRESSION: 1. Removal of endotracheal tube. Esophageal tube tip below the
diaphragm, likely over proximal gastric region
2. Little change in patchy left lung base airspace disease. Similar
architectural distortion and spiculated opacities in the upper lobes
corresponding to findings on chest CT from [DATE]. Cardiomegaly
